# Patient Record
Sex: Male | Born: 1970
Health system: Southern US, Community
[De-identification: ages and names within clinical notes are randomized; demographics above are authoritative.]

## PROBLEM LIST (undated history)

## (undated) DIAGNOSIS — E785 Hyperlipidemia, unspecified: Secondary | ICD-10-CM

## (undated) DIAGNOSIS — I519 Heart disease, unspecified: Secondary | ICD-10-CM

## (undated) DIAGNOSIS — I10 Essential (primary) hypertension: Secondary | ICD-10-CM

## (undated) DIAGNOSIS — I509 Heart failure, unspecified: Secondary | ICD-10-CM

## (undated) DIAGNOSIS — I420 Dilated cardiomyopathy: Secondary | ICD-10-CM

## (undated) DIAGNOSIS — I428 Other cardiomyopathies: Secondary | ICD-10-CM

## (undated) DIAGNOSIS — Z8679 Personal history of other diseases of the circulatory system: Secondary | ICD-10-CM

## (undated) DIAGNOSIS — Z95810 Presence of automatic (implantable) cardiac defibrillator: Secondary | ICD-10-CM

## (undated) DIAGNOSIS — Z87442 Personal history of urinary calculi: Secondary | ICD-10-CM

## (undated) DIAGNOSIS — I5020 Unspecified systolic (congestive) heart failure: Secondary | ICD-10-CM

## (undated) HISTORY — PX: TRANSTHORACIC ECHOCARDIOGRAM: SHX275

## (undated) HISTORY — DX: Heart disease, unspecified: I51.9

## (undated) HISTORY — DX: Essential (primary) hypertension: I10

## (undated) HISTORY — PX: PACEMAKER IMPLANT: EP1218

## (undated) HISTORY — DX: Heart failure, unspecified: I50.9

## (undated) HISTORY — PX: CARDIAC DEFIBRILLATOR PLACEMENT: SHX171

## (undated) HISTORY — DX: Dilated cardiomyopathy: I42.0

## (undated) HISTORY — DX: Hyperlipidemia, unspecified: E78.5

---

## 2004-12-10 ENCOUNTER — Ambulatory Visit (HOSPITAL_COMMUNITY): Admission: RE | Admit: 2004-12-10 | Discharge: 2004-12-10 | Payer: Self-pay | Admitting: Internal Medicine

## 2004-12-10 ENCOUNTER — Ambulatory Visit: Payer: Self-pay | Admitting: Nurse Practitioner

## 2004-12-11 ENCOUNTER — Ambulatory Visit: Payer: Self-pay | Admitting: Family Medicine

## 2005-01-21 ENCOUNTER — Ambulatory Visit (HOSPITAL_COMMUNITY): Admission: RE | Admit: 2005-01-21 | Discharge: 2005-01-21 | Payer: Self-pay | Admitting: Urology

## 2005-04-15 ENCOUNTER — Ambulatory Visit: Payer: Self-pay | Admitting: Family Medicine

## 2005-04-24 ENCOUNTER — Emergency Department (HOSPITAL_COMMUNITY): Admission: EM | Admit: 2005-04-24 | Discharge: 2005-04-24 | Payer: Self-pay | Admitting: Emergency Medicine

## 2005-05-26 ENCOUNTER — Ambulatory Visit: Payer: Self-pay | Admitting: *Deleted

## 2005-11-09 ENCOUNTER — Inpatient Hospital Stay (HOSPITAL_COMMUNITY): Admission: EM | Admit: 2005-11-09 | Discharge: 2005-11-12 | Payer: Self-pay | Admitting: Emergency Medicine

## 2005-11-09 ENCOUNTER — Ambulatory Visit: Payer: Self-pay | Admitting: Cardiology

## 2005-11-09 ENCOUNTER — Encounter: Payer: Self-pay | Admitting: Cardiology

## 2005-11-09 ENCOUNTER — Ambulatory Visit: Payer: Self-pay | Admitting: Internal Medicine

## 2005-11-22 ENCOUNTER — Ambulatory Visit: Payer: Self-pay | Admitting: Family Medicine

## 2005-12-27 ENCOUNTER — Ambulatory Visit: Payer: Self-pay | Admitting: Family Medicine

## 2006-02-23 ENCOUNTER — Ambulatory Visit: Payer: Self-pay | Admitting: Family Medicine

## 2006-07-12 HISTORY — PX: OTHER SURGICAL HISTORY: SHX169

## 2006-07-25 ENCOUNTER — Ambulatory Visit (HOSPITAL_COMMUNITY): Admission: RE | Admit: 2006-07-25 | Discharge: 2006-07-25 | Payer: Self-pay | Admitting: Cardiology

## 2006-07-27 ENCOUNTER — Ambulatory Visit (HOSPITAL_COMMUNITY): Admission: RE | Admit: 2006-07-27 | Discharge: 2006-07-27 | Payer: Self-pay | Admitting: Cardiology

## 2007-12-20 ENCOUNTER — Ambulatory Visit: Payer: Self-pay | Admitting: Internal Medicine

## 2007-12-20 ENCOUNTER — Encounter (INDEPENDENT_AMBULATORY_CARE_PROVIDER_SITE_OTHER): Payer: Self-pay | Admitting: Family Medicine

## 2007-12-20 LAB — CONVERTED CEMR LAB
AST: 24 units/L (ref 0–37)
CO2: 24 meq/L (ref 19–32)
Calcium: 9.8 mg/dL (ref 8.4–10.5)
Chloride: 104 meq/L (ref 96–112)
Glucose, Bld: 100 mg/dL — ABNORMAL HIGH (ref 70–99)
Helicobacter Pylori Antibody-IgG: >8 — ABNORMAL HIGH
Microalb, Ur: 6.86 mg/dL — ABNORMAL HIGH (ref 0.00–1.89)
Potassium: 4.3 meq/L (ref 3.5–5.3)
Total Protein: 7.7 g/dL (ref 6.0–8.3)
Triglycerides: 238 mg/dL — ABNORMAL HIGH (ref <150)

## 2008-02-27 ENCOUNTER — Ambulatory Visit: Payer: Self-pay | Admitting: Internal Medicine

## 2008-04-14 ENCOUNTER — Emergency Department (HOSPITAL_COMMUNITY): Admission: EM | Admit: 2008-04-14 | Discharge: 2008-04-14 | Payer: Self-pay | Admitting: Emergency Medicine

## 2008-09-09 ENCOUNTER — Encounter (INDEPENDENT_AMBULATORY_CARE_PROVIDER_SITE_OTHER): Payer: Self-pay | Admitting: Family Medicine

## 2008-09-09 ENCOUNTER — Ambulatory Visit: Payer: Self-pay | Admitting: Internal Medicine

## 2008-09-09 LAB — CONVERTED CEMR LAB
ALT: 26 units/L (ref 0–53)
AST: 24 units/L (ref 0–37)
Alkaline Phosphatase: 77 units/L (ref 39–117)
BUN: 15 mg/dL (ref 6–23)
CO2: 22 meq/L (ref 19–32)
Creatinine, Ser: 0.99 mg/dL (ref 0.40–1.50)
Glucose, Bld: 103 mg/dL — ABNORMAL HIGH (ref 70–99)
HDL: 25 mg/dL — ABNORMAL LOW (ref >39)
Potassium: 4.3 meq/L (ref 3.5–5.3)
Sodium: 140 meq/L (ref 135–145)
Total CHOL/HDL Ratio: 6.9
Total Protein: 7.6 g/dL (ref 6.0–8.3)
Triglycerides: 278 mg/dL — ABNORMAL HIGH (ref <150)
VLDL: 56 mg/dL — ABNORMAL HIGH (ref 0–40)

## 2008-12-17 ENCOUNTER — Ambulatory Visit: Payer: Self-pay | Admitting: Internal Medicine

## 2009-01-15 ENCOUNTER — Encounter: Payer: Self-pay | Admitting: Internal Medicine

## 2009-01-15 ENCOUNTER — Ambulatory Visit (HOSPITAL_COMMUNITY): Admission: RE | Admit: 2009-01-15 | Discharge: 2009-01-15 | Payer: Self-pay | Admitting: Internal Medicine

## 2009-04-03 ENCOUNTER — Ambulatory Visit: Payer: Self-pay | Admitting: Internal Medicine

## 2009-04-03 ENCOUNTER — Encounter (INDEPENDENT_AMBULATORY_CARE_PROVIDER_SITE_OTHER): Payer: Self-pay | Admitting: Adult Health

## 2009-04-03 LAB — CONVERTED CEMR LAB
ALT: 23 units/L (ref 0–53)
AST: 20 units/L (ref 0–37)
Albumin: 4.5 g/dL (ref 3.5–5.2)
Alkaline Phosphatase: 76 units/L (ref 39–117)
Creatinine, Ser: 1.15 mg/dL (ref 0.40–1.50)
Sodium: 142 meq/L (ref 135–145)
Total Bilirubin: 0.7 mg/dL (ref 0.3–1.2)
Total CHOL/HDL Ratio: 7.2
Total Protein: 7.6 g/dL (ref 6.0–8.3)

## 2009-06-26 ENCOUNTER — Ambulatory Visit: Payer: Self-pay | Admitting: Internal Medicine

## 2009-07-03 ENCOUNTER — Ambulatory Visit: Payer: Self-pay | Admitting: Internal Medicine

## 2009-07-03 ENCOUNTER — Encounter (INDEPENDENT_AMBULATORY_CARE_PROVIDER_SITE_OTHER): Payer: Self-pay | Admitting: Adult Health

## 2009-07-03 LAB — CONVERTED CEMR LAB
AST: 28 units/L (ref 0–37)
CO2: 21 meq/L (ref 19–32)
Chloride: 102 meq/L (ref 96–112)
Cholesterol: 195 mg/dL (ref 0–200)
Magnesium: 2 mg/dL (ref 1.5–2.5)
Sodium: 139 meq/L (ref 135–145)
Total Bilirubin: 0.7 mg/dL (ref 0.3–1.2)

## 2009-07-08 ENCOUNTER — Ambulatory Visit: Payer: Self-pay | Admitting: Internal Medicine

## 2009-08-19 ENCOUNTER — Ambulatory Visit: Payer: Self-pay | Admitting: Family Medicine

## 2009-12-16 ENCOUNTER — Encounter (INDEPENDENT_AMBULATORY_CARE_PROVIDER_SITE_OTHER): Payer: Self-pay | Admitting: Adult Health

## 2009-12-16 ENCOUNTER — Ambulatory Visit: Payer: Self-pay | Admitting: Family Medicine

## 2009-12-16 LAB — CONVERTED CEMR LAB
ALT: 21 units/L (ref 0–53)
Alkaline Phosphatase: 67 units/L (ref 39–117)
BUN: 12 mg/dL (ref 6–23)
Chloride: 104 meq/L (ref 96–112)
Cholesterol: 171 mg/dL (ref 0–200)
Creatinine, Ser: 1.05 mg/dL (ref 0.40–1.50)
HDL: 24 mg/dL — ABNORMAL LOW (ref >39)
LDL Cholesterol: 92 mg/dL (ref 0–99)
Total Bilirubin: 0.5 mg/dL (ref 0.3–1.2)
Total CHOL/HDL Ratio: 7.1
Total Protein: 7.4 g/dL (ref 6.0–8.3)
Triglycerides: 276 mg/dL — ABNORMAL HIGH (ref <150)

## 2010-02-06 ENCOUNTER — Ambulatory Visit: Payer: Self-pay | Admitting: Pulmonary Disease

## 2010-02-06 ENCOUNTER — Inpatient Hospital Stay (HOSPITAL_COMMUNITY): Admission: EM | Admit: 2010-02-06 | Discharge: 2010-02-15 | Payer: Self-pay | Admitting: Emergency Medicine

## 2010-02-06 ENCOUNTER — Ambulatory Visit: Payer: Self-pay | Admitting: Internal Medicine

## 2010-02-07 ENCOUNTER — Encounter (INDEPENDENT_AMBULATORY_CARE_PROVIDER_SITE_OTHER): Payer: Self-pay | Admitting: Cardiology

## 2010-02-17 ENCOUNTER — Encounter: Payer: Self-pay | Admitting: Internal Medicine

## 2010-02-25 ENCOUNTER — Ambulatory Visit: Payer: Self-pay

## 2010-02-25 ENCOUNTER — Encounter: Payer: Self-pay | Admitting: Internal Medicine

## 2010-05-26 ENCOUNTER — Ambulatory Visit: Payer: Self-pay | Admitting: Internal Medicine

## 2010-05-26 DIAGNOSIS — I5022 Chronic systolic (congestive) heart failure: Secondary | ICD-10-CM | POA: Insufficient documentation

## 2010-05-26 DIAGNOSIS — Z95810 Presence of automatic (implantable) cardiac defibrillator: Secondary | ICD-10-CM | POA: Insufficient documentation

## 2010-08-05 ENCOUNTER — Encounter (INDEPENDENT_AMBULATORY_CARE_PROVIDER_SITE_OTHER): Payer: Self-pay | Admitting: *Deleted

## 2010-08-05 LAB — CONVERTED CEMR LAB
Alkaline Phosphatase: 68 units/L (ref 39–117)
BUN: 17 mg/dL (ref 6–23)
CO2: 23 meq/L (ref 19–32)
Calcium: 9.5 mg/dL (ref 8.4–10.5)
Creatinine, Ser: 1.03 mg/dL (ref 0.40–1.50)
Free Thyroxine Index: 4.7 — ABNORMAL HIGH (ref 1.0–3.9)
Hgb A1c MFr Bld: 5.4 % (ref <5.7)
Sodium: 139 meq/L (ref 135–145)
T3 Uptake Ratio: 34.9 % (ref 22.5–37.0)
T4, Total: 13.4 ug/dL — ABNORMAL HIGH (ref 5.0–12.5)
TSH: 1.143 microintl units/mL (ref 0.350–4.500)
Total Protein: 7 g/dL (ref 6.0–8.3)

## 2010-08-12 NOTE — Assessment & Plan Note (Signed)
Summary: df2/ gd   Visit Type:  Follow-up   History of Present Illness: Mr. Wiederholt returns today for followup. He has a DCM, and is s/p VF arrest.  He underwent ICD therapy several months ago secondary to the above problems.  No c/p, sob or peripheral edema.  He has been very sedentary and has gained weight.  No other complaints except for some incisional pain.  Current Medications (verified): 1)  Coreg 12.5 Mg Tabs (Carvedilol) .... One By Mouth Two Times A Day 2)  Amiodarone Hcl 200 Mg Tabs (Amiodarone Hcl) .... One By Mouth Daily 3)  Aspir-Low 81 Mg Tbec (Aspirin) .... One By Mouth Daily 4)  Lisinopril 10 Mg Tabs (Lisinopril) .... One By Mouth Daily 5)  Lipitor 20 Mg Tabs (Atorvastatin Calcium) .... Take One Tablet By Mouth Daily. 6)  Nitrostat 0.4 Mg Subl (Nitroglycerin) .... As Needed  Allergies: 1)  ! * Etomidate  Past History:  Past Medical History: VF arres DCM  Review of Systems  The patient denies chest pain, syncope, dyspnea on exertion, and peripheral edema.    Vital Signs:  Patient profile:   40 year old male Height:      73 inches Weight:      243 pounds BMI:     32.18 Pulse rate:   78 / minute BP sitting:   130 / 90  (left arm)  Vitals Entered By: Laurance Flatten CMA (May 26, 2010 10:11 AM)  Physical Exam  General:  Well developed, well nourished, in no acute distress.  HEENT: normal Neck: supple. No JVD. Carotids 2+ bilaterally no bruits Cor: RRR no rubs, gallops or murmur Lungs: CTA. Well healed ICD incision Ab: soft, nontender. nondistended. No HSM. Good bowel sounds Ext: warm. no cyanosis, clubbing or edema Neuro: alert and oriented. Grossly nonfocal. affect pleasant     ICD Specifications Following MD:  Lewayne Bunting, MD     ICD Vendor:  St Jude     ICD Model Number:  [id number]     ICD Serial Number:  [id number] ICD DOI:  02/13/2010     ICD Implanting MD:  Lewayne Bunting, MD  Lead 1:    Location: RV     DOI: 02/13/2010     Model #: [id number]     Serial  #: [id number]     Status: active  Indications::  VF;DCM   ICD Follow Up Battery Voltage:  GOOD V     Charge Time:  8.4 seconds     Battery Est. Longevity:  8.7 YRS Underlying rhythm:  SR ICD Dependent:  No       ICD Device Measurements Right Ventricle:  Amplitude: 11.7 mV, Impedance: 400 ohms, Threshold: 0.75 V at 0.4 msec Shock Impedance: 48 ohms   Episodes MS Episodes:  0     Coumadin:  No Shock:  0     ATP:  0     Nonsustained:  0     Atrial Therapies:  0 Ventricular Pacing:  0%  Brady Parameters Mode VVI     Lower Rate Limit:  40      Tachy Zones VF:  206     Next Cardiology Appt Due:  08/12/2010 Tech Comments:  NORMAL DEVICE FUNCTION.  NO EPISODES SINCE LAST CHECK. CHANGED RV OUTPUT FROM 3.5 TO 2.5 V. DEMONSTRATED VIBRATORY NOTIFIER FOR PT.  PT HAS MAGIC JACK PHONE SERVICE AND PER MERLIN UNABLE TO USE MERLIN.  ROV IN 3 MTHS W/DEVICE CLINIC. Vella Kohler  May 26, 2010  10:19 AM MD Comments:  Agree with above.  Impression & Recommendations:  Problem # 1:  AUTOMATIC IMPLANTABLE CARDIAC DEFIBRILLATOR SITU (ICD-V45.02) His device is working normally.  will recheck in several months.  Problem # 2:  CHRONIC SYSTOLIC HEART FAILURE (ICD-428.22) His symptoms are class 1-2.  I have asked him to continue his current meds, maintain a low sodium diet, and exercise. The following medications were removed from the medication list:    Effient 10 Mg Tabs (Prasugrel hcl) ..... One by mouth daily His updated medication list for this problem includes:    Coreg 12.5 Mg Tabs (Carvedilol) ..... One by mouth two times a day    Amiodarone Hcl 200 Mg Tabs (Amiodarone hcl) ..... One by mouth daily    Aspir-low 81 Mg Tbec (Aspirin) ..... One by mouth daily    Lisinopril 10 Mg Tabs (Lisinopril) ..... One by mouth daily    Nitrostat 0.4 Mg Subl (Nitroglycerin) .Marland Kitchen... As needed  Patient Instructions: 1)  Your physician recommends that you schedule a follow-up appointment in: 3 months in the device  clinic and 9 months with Dr Ladona Ridgel

## 2010-08-12 NOTE — Procedures (Signed)
Summary: wch./ gd   Current Medications (verified): 1)  Effient 10 Mg Tabs (Prasugrel Hcl) .... One By Mouth Daily 2)  Pepcid 20 Mg Tabs (Famotidine) .... One By Mouth Two Times A Day 3)  Coreg 12.5 Mg Tabs (Carvedilol) .... One By Mouth Two Times A Day 4)  Amiodarone Hcl 200 Mg Tabs (Amiodarone Hcl) .... One By Mouth Daily 5)  Aspir-Low 81 Mg Tbec (Aspirin) .... One By Mouth Daily 6)  Lisinopril 10 Mg Tabs (Lisinopril) .... One By Mouth Daily 7)  Simcor 500-20 Mg Xr24h-Tab (Niacin-Simvastatin) .... Two By Mouth Daily 8)  Nitrostat 0.4 Mg Subl (Nitroglycerin) .... As Needed  Allergies (verified): 1)  ! * Etomidate   ICD Specifications Following MD:  Lewayne Bunting, MD     ICD Vendor:  St Jude     ICD Model Number:  [id number]     ICD Serial Number:  [id number] ICD DOI:  02/13/2010     ICD Implanting MD:  Lewayne Bunting, MD  Lead 1:    Location: RV     DOI: 02/13/2010     Model #: [id number]     Serial #: [id number]     Status: active  Indications::  VF;DCM   ICD Follow Up Remote Check?  No Charge Time:  8.4 seconds     Battery Est. Longevity:  7.6 years Underlying rhythm:  SR ICD Dependent:  No       ICD Device Measurements Right Ventricle:  Amplitude: 11.7 mV, Impedance: 380 ohms, Threshold: 0.75 V at 0.4 msec Shock Impedance: 41 ohms   Episodes Coumadin:  No Shock:  0     ATP:  0     Nonsustained:  0     Ventricular Pacing:  0%  Brady Parameters Mode VVI     Lower Rate Limit:  40      Tachy Zones VF:  206     Next Cardiology Appt Due:  05/12/2010 Tech Comments:  Steri strips removed, no redness or edema noted.  No parameter changes.   Device function normal.  ROV 3 months with Dr. Ladona Ridgel. Altha Harm, LPN  February 25, 2010 12:50 PM

## 2010-08-12 NOTE — Cardiovascular Report (Signed)
Summary: Office Visit   Office Visit   Imported By: Roderic Ovens 05/29/2010 16:28:23  _____________________________________________________________________  External Attachment:    Type:   Image     Comment:   External Document

## 2010-08-12 NOTE — Cardiovascular Report (Signed)
Summary: Office Visit   Office Visit   Imported By: Roderic Ovens 03/12/2010 15:54:37  _____________________________________________________________________  External Attachment:    Type:   Image     Comment:   External Document

## 2010-08-12 NOTE — Miscellaneous (Signed)
Summary: Device preload  Clinical Lists Changes  Observations: Added new observation of ICD INDICATN: VF;DCM (02/17/2010 9:22) Added new observation of ICDLEADSTAT1: active (02/17/2010 9:22) Added new observation of ICDLEADSER1: 04540  (02/17/2010 9:22) Added new observation of ICDLEADMOD1: 0185  (02/17/2010 9:22) Added new observation of ICDLEADLOC1: RV  (02/17/2010 9:22) Added new observation of ICD IMP MD: Lewayne Bunting, MD  (02/17/2010 9:22) Added new observation of ICDLEADDOI1: 02/13/2010  (02/17/2010 9:22) Added new observation of ICD IMPL DTE: 02/13/2010  (02/17/2010 9:22) Added new observation of ICD SERL#: [id number]  (02/17/2010 9:22) Added new observation of ICD MODL#: [id number]  (02/17/2010 6:21) Added new observation of ICDMANUFACTR: [employer] Jude  (02/17/2010 9:22) Added new observation of ICD MD: Lewayne Bunting, MD  (02/17/2010 9:22)       ICD Specifications Following MD:  Lewayne Bunting, MD     ICD Vendor:  St Jude     ICD Model Number:  [id number]     ICD Serial Number:  [id number] ICD DOI:  02/13/2010     ICD Implanting MD:  Lewayne Bunting, MD  Lead 1:    Location: RV     DOI: 02/13/2010     Model #: [id number]     Serial #: [id number]     Status: active  Indications::  VF;DCM

## 2010-08-19 ENCOUNTER — Ambulatory Visit (HOSPITAL_COMMUNITY)
Admission: RE | Admit: 2010-08-19 | Discharge: 2010-08-19 | Disposition: A | Discharge status: Home / Self Care | Payer: Medicaid Other | Source: Ambulatory Visit | Attending: Family Medicine | Admitting: Family Medicine

## 2010-08-19 ENCOUNTER — Ambulatory Visit (HOSPITAL_COMMUNITY)
Admission: RE | Admit: 2010-08-19 | Discharge: 2010-08-19 | Disposition: A | Discharge status: Home / Self Care | Payer: Medicaid Other | Source: Ambulatory Visit | Attending: Internal Medicine | Admitting: Internal Medicine

## 2010-08-19 ENCOUNTER — Other Ambulatory Visit (HOSPITAL_COMMUNITY): Payer: Self-pay | Admitting: Family Medicine

## 2010-08-19 DIAGNOSIS — I4891 Unspecified atrial fibrillation: Secondary | ICD-10-CM | POA: Insufficient documentation

## 2010-08-19 DIAGNOSIS — R52 Pain, unspecified: Secondary | ICD-10-CM

## 2010-08-19 DIAGNOSIS — Z79899 Other long term (current) drug therapy: Secondary | ICD-10-CM | POA: Insufficient documentation

## 2010-08-19 DIAGNOSIS — R079 Chest pain, unspecified: Secondary | ICD-10-CM | POA: Insufficient documentation

## 2010-08-19 DIAGNOSIS — R0602 Shortness of breath: Secondary | ICD-10-CM | POA: Insufficient documentation

## 2010-08-23 ENCOUNTER — Inpatient Hospital Stay (INDEPENDENT_AMBULATORY_CARE_PROVIDER_SITE_OTHER)
Admission: RE | Admit: 2010-08-23 | Discharge: 2010-08-23 | Disposition: A | Discharge status: Home / Self Care | Payer: Self-pay | Source: Ambulatory Visit | Attending: Emergency Medicine | Admitting: Emergency Medicine

## 2010-08-23 ENCOUNTER — Ambulatory Visit (HOSPITAL_COMMUNITY): Payer: Self-pay

## 2010-08-23 DIAGNOSIS — H00019 Hordeolum externum unspecified eye, unspecified eyelid: Secondary | ICD-10-CM

## 2010-08-23 DIAGNOSIS — J3489 Other specified disorders of nose and nasal sinuses: Secondary | ICD-10-CM

## 2010-09-04 ENCOUNTER — Encounter (INDEPENDENT_AMBULATORY_CARE_PROVIDER_SITE_OTHER): Payer: Self-pay | Admitting: *Deleted

## 2010-09-08 NOTE — Letter (Signed)
Summary: Appointment - Missed  [facility], Main Office  [address]   [place], [place] [address]   Phone: [phone]  Fax: [phone]     September 04, 2010 MRN: [id number]   [name] [address] [place], [place]  [address]   Dear [name],  Our records indicate you missed your appointment on 08-27-10   with [facility] .  It is very important that we reach you to reschedule this appointment. We look forward to participating in your health care needs. Please contact us at the number listed above at your earliest convenience to reschedule this appointment.     Sincerely,    Glass blower/designer

## 2010-09-24 ENCOUNTER — Encounter: Payer: Self-pay | Admitting: Internal Medicine

## 2010-09-24 LAB — CONVERTED CEMR LAB
Free Thyroxine Index: 4 — ABNORMAL HIGH (ref 1.0–3.9)
T3 Uptake Ratio: 34.9 % (ref 22.5–37.0)
T4, Total: 11.6 ug/dL (ref 5.0–12.5)
TSH: 0.774 microintl units/mL (ref 0.350–4.500)

## 2010-09-25 LAB — GLUCOSE, CAPILLARY
Glucose-Capillary: 101 mg/dL — ABNORMAL HIGH (ref 70–99)
Glucose-Capillary: 103 mg/dL — ABNORMAL HIGH (ref 70–99)
Glucose-Capillary: 103 mg/dL — ABNORMAL HIGH (ref 70–99)
Glucose-Capillary: 104 mg/dL — ABNORMAL HIGH (ref 70–99)
Glucose-Capillary: 106 mg/dL — ABNORMAL HIGH (ref 70–99)
Glucose-Capillary: 118 mg/dL — ABNORMAL HIGH (ref 70–99)
Glucose-Capillary: 123 mg/dL — ABNORMAL HIGH (ref 70–99)
Glucose-Capillary: 127 mg/dL — ABNORMAL HIGH (ref 70–99)
Glucose-Capillary: 131 mg/dL — ABNORMAL HIGH (ref 70–99)
Glucose-Capillary: 131 mg/dL — ABNORMAL HIGH (ref 70–99)
Glucose-Capillary: 67 mg/dL — ABNORMAL LOW (ref 70–99)
Glucose-Capillary: 72 mg/dL (ref 70–99)
Glucose-Capillary: 86 mg/dL (ref 70–99)
Glucose-Capillary: 87 mg/dL (ref 70–99)
Glucose-Capillary: 94 mg/dL (ref 70–99)
Glucose-Capillary: 95 mg/dL (ref 70–99)
Glucose-Capillary: 95 mg/dL (ref 70–99)

## 2010-09-25 LAB — PROCALCITONIN
Procalcitonin: 0.63 ng/mL
Procalcitonin: 0.92 ng/mL

## 2010-09-25 LAB — DIFFERENTIAL
Basophils Absolute: 0.1 10*3/uL (ref 0.0–0.1)
Eosinophils Absolute: 1.2 10*3/uL — ABNORMAL HIGH (ref 0.0–0.7)
Eosinophils Relative: 11 % — ABNORMAL HIGH (ref 0–5)
Eosinophils Relative: 11 % — ABNORMAL HIGH (ref 0–5)
Lymphocytes Relative: 24 % (ref 12–46)
Lymphocytes Relative: 35 % (ref 12–46)
Lymphs Abs: 3.3 10*3/uL (ref 0.7–4.0)
Lymphs Abs: 3.7 10*3/uL (ref 0.7–4.0)
Monocytes Absolute: 1 10*3/uL (ref 0.1–1.0)
Monocytes Absolute: 1.3 10*3/uL — ABNORMAL HIGH (ref 0.1–1.0)
Monocytes Absolute: 1.3 10*3/uL — ABNORMAL HIGH (ref 0.1–1.0)
Monocytes Relative: 11 % (ref 3–12)
Neutro Abs: 4.5 10*3/uL (ref 1.7–7.7)
Neutro Abs: 5.2 10*3/uL (ref 1.7–7.7)
Neutrophils Relative %: 55 % (ref 43–77)

## 2010-09-25 LAB — COMPREHENSIVE METABOLIC PANEL
ALT: 22 U/L (ref 0–53)
BUN: 7 mg/dL (ref 6–23)
Calcium: 8.4 mg/dL (ref 8.4–10.5)
Creatinine, Ser: 1.11 mg/dL (ref 0.4–1.5)
Glucose, Bld: 83 mg/dL (ref 70–99)
Sodium: 138 mEq/L (ref 135–145)
Total Protein: 5.8 g/dL — ABNORMAL LOW (ref 6.0–8.3)

## 2010-09-25 LAB — BRAIN NATRIURETIC PEPTIDE: Pro B Natriuretic peptide (BNP): 134 pg/mL — ABNORMAL HIGH (ref 0.0–100.0)

## 2010-09-25 LAB — BASIC METABOLIC PANEL
BUN: 12 mg/dL (ref 6–23)
BUN: 14 mg/dL (ref 6–23)
BUN: 16 mg/dL (ref 6–23)
BUN: 17 mg/dL (ref 6–23)
BUN: 18 mg/dL (ref 6–23)
CO2: 22 mEq/L (ref 19–32)
CO2: 26 mEq/L (ref 19–32)
CO2: 27 mEq/L (ref 19–32)
Calcium: 8 mg/dL — ABNORMAL LOW (ref 8.4–10.5)
Calcium: 8.9 mg/dL (ref 8.4–10.5)
Calcium: 9 mg/dL (ref 8.4–10.5)
Calcium: 9.3 mg/dL (ref 8.4–10.5)
Chloride: 100 mEq/L (ref 96–112)
Chloride: 104 mEq/L (ref 96–112)
Chloride: 105 mEq/L (ref 96–112)
Chloride: 99 mEq/L (ref 96–112)
Creatinine, Ser: 1.33 mg/dL (ref 0.4–1.5)
Creatinine, Ser: 1.39 mg/dL (ref 0.4–1.5)
Creatinine, Ser: 1.43 mg/dL (ref 0.4–1.5)
GFR calc Af Amer: >60 mL/min (ref >60)
GFR calc Af Amer: >60 mL/min (ref >60)
GFR calc Af Amer: >60 mL/min (ref >60)
GFR calc non Af Amer: >60 mL/min (ref >60)
GFR calc non Af Amer: >60 mL/min (ref >60)
GFR calc non Af Amer: >60 mL/min (ref >60)
Glucose, Bld: 132 mg/dL — ABNORMAL HIGH (ref 70–99)
Glucose, Bld: 84 mg/dL (ref 70–99)
Potassium: 3.1 mEq/L — ABNORMAL LOW (ref 3.5–5.1)
Potassium: 3.6 mEq/L (ref 3.5–5.1)
Potassium: 3.6 mEq/L (ref 3.5–5.1)
Potassium: 3.6 mEq/L (ref 3.5–5.1)
Potassium: 3.7 mEq/L (ref 3.5–5.1)
Potassium: 3.7 mEq/L (ref 3.5–5.1)
Sodium: 136 mEq/L (ref 135–145)
Sodium: 139 mEq/L (ref 135–145)
Sodium: 139 mEq/L (ref 135–145)
Sodium: 141 mEq/L (ref 135–145)
Sodium: 142 mEq/L (ref 135–145)

## 2010-09-25 LAB — CBC
HCT: 34 % — ABNORMAL LOW (ref 39.0–52.0)
HCT: 34.9 % — ABNORMAL LOW (ref 39.0–52.0)
HCT: 37.5 % — ABNORMAL LOW (ref 39.0–52.0)
HCT: 43.3 % (ref 39.0–52.0)
Hemoglobin: 12 g/dL — ABNORMAL LOW (ref 13.0–17.0)
Hemoglobin: 12.2 g/dL — ABNORMAL LOW (ref 13.0–17.0)
Hemoglobin: 13.2 g/dL (ref 13.0–17.0)
Hemoglobin: 15.2 g/dL (ref 13.0–17.0)
MCH: 28.1 pg (ref 26.0–34.0)
MCH: 28.8 pg (ref 26.0–34.0)
MCV: 80.5 fL (ref 78.0–100.0)
MCV: 80.9 fL (ref 78.0–100.0)
Platelets: 323 10*3/uL (ref 150–400)
RBC: 4.16 MIL/uL — ABNORMAL LOW (ref 4.22–5.81)
RBC: 4.21 MIL/uL — ABNORMAL LOW (ref 4.22–5.81)
RBC: 4.3 MIL/uL (ref 4.22–5.81)
RBC: 5.38 MIL/uL (ref 4.22–5.81)
RBC: 5.4 MIL/uL (ref 4.22–5.81)
RDW: 14.8 % (ref 11.5–15.5)
WBC: 10.7 10*3/uL — ABNORMAL HIGH (ref 4.0–10.5)
WBC: 9.3 10*3/uL (ref 4.0–10.5)
WBC: 9.5 10*3/uL (ref 4.0–10.5)

## 2010-09-25 LAB — BLOOD GAS, ARTERIAL
FIO2: 0.3 %
MECHVT: 660 mL
PEEP: 5 cmH2O
Patient temperature: 98.6
RATE: 16 resp/min
pCO2 arterial: 34.8 mmHg — ABNORMAL LOW (ref 35.0–45.0)
pH, Arterial: 7.4 (ref 7.350–7.450)
pO2, Arterial: 111 mmHg — ABNORMAL HIGH (ref 80.0–100.0)

## 2010-09-25 LAB — CULTURE, BAL-QUANTITATIVE W GRAM STAIN

## 2010-09-25 LAB — T4, FREE: Free T4: 1.16 ng/dL (ref 0.80–1.80)

## 2010-09-25 LAB — SODIUM, URINE, RANDOM: Sodium, Ur: 215 mEq/L

## 2010-09-25 LAB — MAGNESIUM
Magnesium: 2.4 mg/dL (ref 1.5–2.5)
Magnesium: 2.4 mg/dL (ref 1.5–2.5)
Magnesium: 2.5 mg/dL (ref 1.5–2.5)
Magnesium: 2.7 mg/dL — ABNORMAL HIGH (ref 1.5–2.5)

## 2010-09-25 LAB — T3, FREE: T3, Free: 1.9 pg/mL — ABNORMAL LOW (ref 2.3–4.2)

## 2010-09-25 LAB — PROTIME-INR: Prothrombin Time: 14.5 seconds (ref 11.6–15.2)

## 2010-09-25 LAB — PHOSPHORUS: Phosphorus: 3.7 mg/dL (ref 2.3–4.6)

## 2010-09-26 LAB — POCT I-STAT, CHEM 8
BUN: 11 mg/dL (ref 6–23)
Calcium, Ion: 0.88 mmol/L — ABNORMAL LOW (ref 1.12–1.32)
Chloride: 111 mEq/L (ref 96–112)
Creatinine, Ser: 1.1 mg/dL (ref 0.4–1.5)
Glucose, Bld: 191 mg/dL — ABNORMAL HIGH (ref 70–99)
TCO2: 18 mmol/L (ref 0–100)

## 2010-09-26 LAB — BASIC METABOLIC PANEL
BUN: 10 mg/dL (ref 6–23)
BUN: 10 mg/dL (ref 6–23)
BUN: 10 mg/dL (ref 6–23)
BUN: 11 mg/dL (ref 6–23)
BUN: 12 mg/dL (ref 6–23)
BUN: 12 mg/dL (ref 6–23)
BUN: 13 mg/dL (ref 6–23)
BUN: 9 mg/dL (ref 6–23)
CO2: 14 mEq/L — ABNORMAL LOW (ref 19–32)
CO2: 15 mEq/L — ABNORMAL LOW (ref 19–32)
CO2: 15 mEq/L — ABNORMAL LOW (ref 19–32)
CO2: 16 mEq/L — ABNORMAL LOW (ref 19–32)
CO2: 17 mEq/L — ABNORMAL LOW (ref 19–32)
CO2: 17 mEq/L — ABNORMAL LOW (ref 19–32)
CO2: 17 mEq/L — ABNORMAL LOW (ref 19–32)
CO2: 17 mEq/L — ABNORMAL LOW (ref 19–32)
CO2: 17 mEq/L — ABNORMAL LOW (ref 19–32)
Calcium: 7.7 mg/dL — ABNORMAL LOW (ref 8.4–10.5)
Calcium: 8.1 mg/dL — ABNORMAL LOW (ref 8.4–10.5)
Calcium: 8.1 mg/dL — ABNORMAL LOW (ref 8.4–10.5)
Calcium: 8.4 mg/dL (ref 8.4–10.5)
Chloride: 112 mEq/L (ref 96–112)
Chloride: 113 mEq/L — ABNORMAL HIGH (ref 96–112)
Chloride: 114 mEq/L — ABNORMAL HIGH (ref 96–112)
Chloride: 115 mEq/L — ABNORMAL HIGH (ref 96–112)
Chloride: 115 mEq/L — ABNORMAL HIGH (ref 96–112)
Chloride: 116 mEq/L — ABNORMAL HIGH (ref 96–112)
Chloride: 117 mEq/L — ABNORMAL HIGH (ref 96–112)
Chloride: 118 mEq/L — ABNORMAL HIGH (ref 96–112)
Creatinine, Ser: 0.72 mg/dL (ref 0.4–1.5)
Creatinine, Ser: 0.75 mg/dL (ref 0.4–1.5)
Creatinine, Ser: 0.79 mg/dL (ref 0.4–1.5)
Creatinine, Ser: 0.79 mg/dL (ref 0.4–1.5)
Creatinine, Ser: 0.82 mg/dL (ref 0.4–1.5)
Creatinine, Ser: 0.84 mg/dL (ref 0.4–1.5)
Creatinine, Ser: 0.87 mg/dL (ref 0.4–1.5)
Creatinine, Ser: 0.9 mg/dL (ref 0.4–1.5)
Creatinine, Ser: 1.01 mg/dL (ref 0.4–1.5)
Creatinine, Ser: 1.11 mg/dL (ref 0.4–1.5)
GFR calc Af Amer: >60 mL/min (ref >60)
GFR calc Af Amer: >60 mL/min (ref >60)
GFR calc Af Amer: >60 mL/min (ref >60)
GFR calc Af Amer: >60 mL/min (ref >60)
GFR calc Af Amer: >60 mL/min (ref >60)
GFR calc Af Amer: >60 mL/min (ref >60)
GFR calc Af Amer: >60 mL/min (ref >60)
GFR calc Af Amer: >60 mL/min (ref >60)
GFR calc non Af Amer: >60 mL/min (ref >60)
GFR calc non Af Amer: >60 mL/min (ref >60)
GFR calc non Af Amer: >60 mL/min (ref >60)
GFR calc non Af Amer: >60 mL/min (ref >60)
Glucose, Bld: 118 mg/dL — ABNORMAL HIGH (ref 70–99)
Glucose, Bld: 137 mg/dL — ABNORMAL HIGH (ref 70–99)
Glucose, Bld: 161 mg/dL — ABNORMAL HIGH (ref 70–99)
Glucose, Bld: 169 mg/dL — ABNORMAL HIGH (ref 70–99)
Glucose, Bld: 174 mg/dL — ABNORMAL HIGH (ref 70–99)
Glucose, Bld: 223 mg/dL — ABNORMAL HIGH (ref 70–99)
Potassium: 3.5 mEq/L (ref 3.5–5.1)
Potassium: 3.6 mEq/L (ref 3.5–5.1)
Potassium: 3.7 mEq/L (ref 3.5–5.1)
Potassium: 3.8 mEq/L (ref 3.5–5.1)
Potassium: 3.8 mEq/L (ref 3.5–5.1)
Potassium: 4 mEq/L (ref 3.5–5.1)
Potassium: 4 mEq/L (ref 3.5–5.1)
Potassium: 4.3 mEq/L (ref 3.5–5.1)
Potassium: 5.5 mEq/L — ABNORMAL HIGH (ref 3.5–5.1)
Sodium: 134 mEq/L — ABNORMAL LOW (ref 135–145)
Sodium: 135 mEq/L (ref 135–145)
Sodium: 137 mEq/L (ref 135–145)

## 2010-09-26 LAB — GLUCOSE, CAPILLARY
Glucose-Capillary: 101 mg/dL — ABNORMAL HIGH (ref 70–99)
Glucose-Capillary: 117 mg/dL — ABNORMAL HIGH (ref 70–99)
Glucose-Capillary: 124 mg/dL — ABNORMAL HIGH (ref 70–99)
Glucose-Capillary: 138 mg/dL — ABNORMAL HIGH (ref 70–99)
Glucose-Capillary: 145 mg/dL — ABNORMAL HIGH (ref 70–99)
Glucose-Capillary: 151 mg/dL — ABNORMAL HIGH (ref 70–99)
Glucose-Capillary: 163 mg/dL — ABNORMAL HIGH (ref 70–99)
Glucose-Capillary: 163 mg/dL — ABNORMAL HIGH (ref 70–99)
Glucose-Capillary: 166 mg/dL — ABNORMAL HIGH (ref 70–99)
Glucose-Capillary: 171 mg/dL — ABNORMAL HIGH (ref 70–99)
Glucose-Capillary: 178 mg/dL — ABNORMAL HIGH (ref 70–99)
Glucose-Capillary: 183 mg/dL — ABNORMAL HIGH (ref 70–99)
Glucose-Capillary: 194 mg/dL — ABNORMAL HIGH (ref 70–99)
Glucose-Capillary: 195 mg/dL — ABNORMAL HIGH (ref 70–99)
Glucose-Capillary: 221 mg/dL — ABNORMAL HIGH (ref 70–99)
Glucose-Capillary: 268 mg/dL — ABNORMAL HIGH (ref 70–99)
Glucose-Capillary: 91 mg/dL (ref 70–99)

## 2010-09-26 LAB — PHOSPHORUS
Phosphorus: 2.8 mg/dL (ref 2.3–4.6)
Phosphorus: 2.9 mg/dL (ref 2.3–4.6)
Phosphorus: 3.8 mg/dL (ref 2.3–4.6)
Phosphorus: <1 mg/dL — CL (ref 2.3–4.6)
Phosphorus: <1 mg/dL — CL (ref 2.3–4.6)

## 2010-09-26 LAB — DIFFERENTIAL
Basophils Absolute: 0.1 10*3/uL (ref 0.0–0.1)
Eosinophils Absolute: 0.7 10*3/uL (ref 0.0–0.7)
Lymphs Abs: 8.2 10*3/uL — ABNORMAL HIGH (ref 0.7–4.0)
Monocytes Absolute: 0.8 10*3/uL (ref 0.1–1.0)
Monocytes Relative: 6 % (ref 3–12)
Neutro Abs: 3.8 10*3/uL (ref 1.7–7.7)

## 2010-09-26 LAB — BLOOD GAS, ARTERIAL
Acid-base deficit: 7.4 mmol/L — ABNORMAL HIGH (ref 0.0–2.0)
Bicarbonate: 16.1 mEq/L — ABNORMAL LOW (ref 20.0–24.0)
Bicarbonate: 18.2 mEq/L — ABNORMAL LOW (ref 20.0–24.0)
Bicarbonate: 20.3 mEq/L (ref 20.0–24.0)
FIO2: 0.5 %
O2 Saturation: 100 %
O2 Saturation: 96 %
PEEP: 5 cmH2O
PEEP: 5 cmH2O
Patient temperature: 98.6
TCO2: 21.4 mmol/L (ref 0–100)
pCO2 arterial: 24.3 mmHg — ABNORMAL LOW (ref 35.0–45.0)
pCO2 arterial: 37.2 mmHg (ref 35.0–45.0)
pH, Arterial: 7.311 — ABNORMAL LOW (ref 7.350–7.450)
pH, Arterial: 7.388 (ref 7.350–7.450)
pO2, Arterial: 103 mmHg — ABNORMAL HIGH (ref 80.0–100.0)
pO2, Arterial: 344 mmHg — ABNORMAL HIGH (ref 80.0–100.0)

## 2010-09-26 LAB — MAGNESIUM
Magnesium: 1.8 mg/dL (ref 1.5–2.5)
Magnesium: 1.9 mg/dL (ref 1.5–2.5)
Magnesium: 2 mg/dL (ref 1.5–2.5)
Magnesium: 2.2 mg/dL (ref 1.5–2.5)

## 2010-09-26 LAB — CBC
HCT: 42.6 % (ref 39.0–52.0)
Hemoglobin: 16.8 g/dL (ref 13.0–17.0)
MCH: 28.9 pg (ref 26.0–34.0)
MCH: 29.5 pg (ref 26.0–34.0)
MCHC: 34.3 g/dL (ref 30.0–36.0)
MCV: 83.3 fL (ref 78.0–100.0)
MCV: 84.7 fL (ref 78.0–100.0)
Platelets: 260 10*3/uL (ref 150–400)
Platelets: 286 10*3/uL (ref 150–400)
Platelets: 344 10*3/uL (ref 150–400)
RBC: 5.35 MIL/uL (ref 4.22–5.81)
RBC: 5.74 MIL/uL (ref 4.22–5.81)
RBC: 5.81 MIL/uL (ref 4.22–5.81)
RDW: 13.6 % (ref 11.5–15.5)
RDW: 14.1 % (ref 11.5–15.5)
RDW: 14.1 % (ref 11.5–15.5)
WBC: 10.4 10*3/uL (ref 4.0–10.5)
WBC: 23 10*3/uL — ABNORMAL HIGH (ref 4.0–10.5)

## 2010-09-26 LAB — URINALYSIS, ROUTINE W REFLEX MICROSCOPIC
Bilirubin Urine: NEGATIVE
Bilirubin Urine: NEGATIVE
Glucose, UA: 250 mg/dL — AB
Ketones, ur: NEGATIVE mg/dL
Ketones, ur: NEGATIVE mg/dL
Leukocytes, UA: NEGATIVE
Leukocytes, UA: NEGATIVE
Nitrite: NEGATIVE
Nitrite: NEGATIVE
Protein, ur: >300 mg/dL — AB
Protein, ur: NEGATIVE mg/dL
Protein, ur: NEGATIVE mg/dL
Specific Gravity, Urine: 1.01 (ref 1.005–1.030)
Specific Gravity, Urine: 1.02 (ref 1.005–1.030)
Urobilinogen, UA: 0.2 mg/dL (ref 0.0–1.0)
Urobilinogen, UA: 1 mg/dL (ref 0.0–1.0)

## 2010-09-26 LAB — TSH: TSH: 0.07 u[IU]/mL — ABNORMAL LOW (ref 0.350–4.500)

## 2010-09-26 LAB — CULTURE, RESPIRATORY W GRAM STAIN

## 2010-09-26 LAB — POCT I-STAT 3, ART BLOOD GAS (G3+)
Bicarbonate: 20.4 mEq/L (ref 20.0–24.0)
Bicarbonate: 20.9 mEq/L (ref 20.0–24.0)
O2 Saturation: 96 %
TCO2: 22 mmol/L (ref 0–100)
pCO2 arterial: 48.8 mmHg — ABNORMAL HIGH (ref 35.0–45.0)
pH, Arterial: 7.184 — CL (ref 7.350–7.450)
pH, Arterial: 7.24 — ABNORMAL LOW (ref 7.350–7.450)

## 2010-09-26 LAB — POCT CARDIAC MARKERS: Troponin i, poc: <0.05 ng/mL (ref 0.00–0.09)

## 2010-09-26 LAB — URINE CULTURE
Colony Count: NO GROWTH
Colony Count: NO GROWTH
Culture: NO GROWTH
Culture: NO GROWTH
Special Requests: NEGATIVE

## 2010-09-26 LAB — PROTIME-INR: INR: 1.11 (ref 0.00–1.49)

## 2010-09-26 LAB — HEPATIC FUNCTION PANEL
ALT: 57 U/L — ABNORMAL HIGH (ref 0–53)
AST: 53 U/L — ABNORMAL HIGH (ref 0–37)
Alkaline Phosphatase: 62 U/L (ref 39–117)
Bilirubin, Direct: 0.3 mg/dL (ref 0.0–0.3)
Total Bilirubin: 1.3 mg/dL — ABNORMAL HIGH (ref 0.3–1.2)

## 2010-09-26 LAB — CARDIAC PANEL(CRET KIN+CKTOT+MB+TROPI)
CK, MB: 5.5 ng/mL — ABNORMAL HIGH (ref 0.3–4.0)
Relative Index: 3 — ABNORMAL HIGH (ref 0.0–2.5)
Relative Index: 4 — ABNORMAL HIGH (ref 0.0–2.5)
Relative Index: 5.3 — ABNORMAL HIGH (ref 0.0–2.5)
Total CK: 309 U/L — ABNORMAL HIGH (ref 7–232)
Troponin I: 0.05 ng/mL (ref 0.00–0.06)
Troponin I: 0.23 ng/mL — ABNORMAL HIGH (ref 0.00–0.06)

## 2010-09-26 LAB — URINE MICROSCOPIC-ADD ON

## 2010-09-26 LAB — URINE DRUGS OF ABUSE SCREEN W ALC, ROUTINE (REF LAB)
Benzodiazepines.: POSITIVE — AB
Cocaine Metabolites: NEGATIVE
Creatinine,U: 22.7 mg/dL
Ethyl Alcohol: <10 mg/dL (ref <10)
Opiate Screen, Urine: NEGATIVE

## 2010-09-26 LAB — CULTURE, BLOOD (ROUTINE X 2)
Culture: NO GROWTH
Culture: NO GROWTH

## 2010-09-26 LAB — BENZODIAZEPINE, QUANTITATIVE, URINE
Nordiazepam GC/MS Conf: NEGATIVE NG/ML
Oxazepam GC/MS Conf: NEGATIVE NG/ML
Temazepam GC/MS Conf: NEGATIVE NG/ML

## 2010-09-26 LAB — BRAIN NATRIURETIC PEPTIDE
Pro B Natriuretic peptide (BNP): 225 pg/mL — ABNORMAL HIGH (ref 0.0–100.0)
Pro B Natriuretic peptide (BNP): 38 pg/mL (ref 0.0–100.0)

## 2010-09-26 LAB — APTT: aPTT: 26 seconds (ref 24–37)

## 2010-09-26 LAB — MRSA PCR SCREENING: MRSA by PCR: NEGATIVE

## 2010-09-30 ENCOUNTER — Encounter: Payer: Self-pay | Admitting: *Deleted

## 2010-10-16 ENCOUNTER — Inpatient Hospital Stay (INDEPENDENT_AMBULATORY_CARE_PROVIDER_SITE_OTHER)
Admission: RE | Admit: 2010-10-16 | Discharge: 2010-10-16 | Disposition: A | Discharge status: Home / Self Care | Payer: Medicaid Other | Source: Ambulatory Visit | Attending: Emergency Medicine | Admitting: Emergency Medicine

## 2010-10-16 DIAGNOSIS — J309 Allergic rhinitis, unspecified: Secondary | ICD-10-CM

## 2010-10-16 DIAGNOSIS — H1045 Other chronic allergic conjunctivitis: Secondary | ICD-10-CM

## 2010-11-26 ENCOUNTER — Ambulatory Visit (INDEPENDENT_AMBULATORY_CARE_PROVIDER_SITE_OTHER): Payer: Medicaid Other | Admitting: *Deleted

## 2010-11-26 DIAGNOSIS — I4901 Ventricular fibrillation: Secondary | ICD-10-CM

## 2010-11-27 NOTE — Discharge Summary (Signed)
NAMEHANNIBAL, [name] NO.:  [id number]   MEDICAL RECORD NO.:  [id number]          PATIENT TYPE:  INP   LOCATION:  4713                         FACILITY:  [facility]   PHYSICIAN:  Ileana Roup, M.D.  DATE OF BIRTH:  1971/01/07   DATE OF ADMISSION:  11/09/2005  DATE OF DISCHARGE:  11/12/2005                                 DISCHARGE SUMMARY   DISCHARGE DIAGNOSES:  1. Acute pulmonary edema.  2. Congestive heart failure.  3. Nonischemic cardiomyopathy.  4. Hypertension.   DISCHARGE MEDICATIONS:  1. Metoprolol 25 mg b.i.d.  2. Lisinopril 10 mg every day.  3. Lasix 40 mg every day.  4. K-Dur 20 mEq every day.  5. Baby aspirin 81 mg every day.  6. Lipitor 40 mg every day.   DISPOSITION/AND FOLLOWUP:  The patient will followup with his primary care  physician at [facility], which is Dr. Fannie Knee Drinkard, on Nov 18, 2005 at  10:15 a.m.  Phone number [phone].   IMAGING STUDIES:  Upon admission the patient had a chest x-ray on Nov 09, 2005 which showed diffuse interstitial and bilateral air space disease.  A  CT scan of the chest on Nov 09, 2005 showed bilateral airspace disease, left  greater than right which spares the periphery,associated with interlobular  septal thickening, most compatible with edema; moderate bilateral pleural  effusions; no evidence of pulmonary embolus;and  mildly enlarged mediastinal  lymph nodes. The patient also had a 2-D echocardiogram on Nov 09, 2005  consistent with overall left ventricular systolic function moderately to  markedly decreased with an ejection fraction of 30 to 35% with severe  diffuse left ventricular hypokinesis; mild to moderate thickening of the  mitral valve; moderate mitral valvular regurgitation; the left atrium was  mild to moderately dilated.  The patient had a stress Myoview on Nov 10, 2005  which showed no evidence of myocardial ischemia, and decreased activity in  the inferior wall, septum and apex probably  artifactual related to focal  increased activity in the anterior and lateral walls; left ventricular  enlargement, global hypokinesis and an ejection fraction of 34%.  Chest x-  ray upon discharge on Nov 12, 2005 showed interval improvement in aeration  and edema.   HISTORY AND PHYSICAL:  For full details please refer to the patient's chart,  but in brief the patient is a 40 year old male from [place] with no  significant past medical history who came into the emergency room  complaining of shortness of breath x2 days that started during sleeping  after he was moving.  He denied any chest pain, nausea, vomiting, or  palpitations. He had similar episodes about two months prior but his  shortness of breath was less severe.  After being treated with nebulizer  treatments and Lasix he felt much better.   PHYSICAL EXAMINATION:  VITAL SIGNS: Upon admission, temperature 99.4, blood  pressure 147/100 with a heart rate of 122, respirations 22 and labored.  Oxygen saturation 95% on 3 liters who was not in acute distress.  HEENT: Eyes:  Equally reactive to light with extraocular movements  intact.  RESPIRATORY: He had poor air movement and diffuse crackles.  CARDIOVASCULAR: He was tachycardic with a regular rhythm.  No murmurs  appreciated.  GASTROINTESTINAL: Soft, nontender, nondistended, positive bowel sounds.  NEUROLOGICAL: He was neurologically intact.   LABORATORY DATA:  BNP 290.  D-dimer 0.81.  Sodium 140.  Potassium 3.8.  Chloride 109.  Bicarbonate 23.  BUN 14.  Creatinine 1.5.  Glucose 121.  WBC  10.3.  Hemoglobin 16.5.  Platelets 254.  His PT was 12.9.  INR was 1.0.  PTT  21.   HOSPITAL COURSE:  His acute pulmonary edema secondary to CHF was treated  with Lasix and he diuresed appropriately, over three liters throughout his  entire hospital stay.  A 2-D echocardiogram showed left ventricular  dysfucntion as above, so cardiology was consulted and a stress Myoview  Cardiolite was performed  which showed a decreased ejection fraction but no  evidence of ischemia, consistent with nonischemic cardiomyopathy.  Medical  treatment was started with a beta blocker, Ace inhibitor as well as Lasix  and as well treating his risk factors.  He was placed on Lipitor for the  hyperlipidemia and daily aspirin.   Vital signs on the day of discharge, temperature 97.7, blood pressure 121/58  with a heart rate of 61, saturating at 96% on room air.  Labs on the day of  discharge, WBC 10.7, hemoglobin 16.6, platelets 296, sodium 140, potassium  4.0, chloride 104, bicarbonate 28, BUN 16, creatinine 1.3, glucose of 99.      Peggye Pitt, M.D.  Electronically Signed      Ileana Roup, M.D.  Electronically Signed    EH/MEDQ  D:  01/03/2006  T:  01/03/2006  Job:  [id number]   cc:   Eduardo Osier. Sharyn Lull, M.D.

## 2011-01-06 ENCOUNTER — Other Ambulatory Visit: Payer: Self-pay | Admitting: Geriatric Medicine

## 2011-01-06 DIAGNOSIS — R109 Unspecified abdominal pain: Secondary | ICD-10-CM

## 2011-01-07 ENCOUNTER — Ambulatory Visit
Admission: RE | Admit: 2011-01-07 | Discharge: 2011-01-07 | Disposition: A | Discharge status: Home / Self Care | Payer: Medicaid Other | Source: Ambulatory Visit | Attending: Geriatric Medicine | Admitting: Geriatric Medicine

## 2011-01-07 DIAGNOSIS — R109 Unspecified abdominal pain: Secondary | ICD-10-CM

## 2011-01-07 DIAGNOSIS — R10A1 Flank pain, right side: Secondary | ICD-10-CM

## 2011-01-08 ENCOUNTER — Other Ambulatory Visit: Payer: Medicaid Other

## 2011-03-01 ENCOUNTER — Encounter: Payer: Medicaid Other | Admitting: *Deleted

## 2011-03-29 ENCOUNTER — Encounter: Payer: Self-pay | Admitting: Internal Medicine

## 2011-03-29 ENCOUNTER — Ambulatory Visit (INDEPENDENT_AMBULATORY_CARE_PROVIDER_SITE_OTHER): Payer: Self-pay | Admitting: *Deleted

## 2011-03-29 DIAGNOSIS — I4901 Ventricular fibrillation: Secondary | ICD-10-CM

## 2011-03-29 DIAGNOSIS — I5022 Chronic systolic (congestive) heart failure: Secondary | ICD-10-CM

## 2011-03-29 LAB — ICD DEVICE OBSERVATION
BRDY-0002RV: 40 {beats}/min
FVT: 0
HV IMPEDENCE: 49 Ohm
TOT-0007: 1
VENTRICULAR PACING ICD: 0.01 pct
VF: 0

## 2011-03-29 NOTE — Progress Notes (Signed)
ICD check with ICM 

## 2011-04-12 LAB — POCT I-STAT, CHEM 8
Calcium, Ion: 1.16
Creatinine, Ser: 1.1
Hemoglobin: 15.3
Sodium: 138
TCO2: 23

## 2011-05-20 ENCOUNTER — Encounter: Payer: Self-pay | Admitting: Internal Medicine

## 2011-05-20 ENCOUNTER — Ambulatory Visit (INDEPENDENT_AMBULATORY_CARE_PROVIDER_SITE_OTHER): Payer: Self-pay | Admitting: Internal Medicine

## 2011-05-20 DIAGNOSIS — I428 Other cardiomyopathies: Secondary | ICD-10-CM

## 2011-05-20 DIAGNOSIS — I5022 Chronic systolic (congestive) heart failure: Secondary | ICD-10-CM

## 2011-05-20 DIAGNOSIS — Z95810 Presence of automatic (implantable) cardiac defibrillator: Secondary | ICD-10-CM

## 2011-05-20 NOTE — Assessment & Plan Note (Signed)
His device is working normally. He'll continue his current medical therapy

## 2011-05-20 NOTE — Progress Notes (Signed)
HPI [name] returns today for followup. He has a dilated cardiomyopathy and class 1-2 congestive heart failure. He is status post resuscitated ventricular fibrillation arrest. The patient has had no recurrent ICD therapies. He denies chest pain, shortness of breath, or peripheral edema. No Known Allergies   Current Outpatient Prescriptions  Medication Sig Dispense Refill  . amiodarone (PACERONE) 200 MG tablet Take 200 mg by mouth daily.        Marland Kitchen aspirin 81 MG tablet Take 81 mg by mouth 2 (two) times daily.        . carvedilol (COREG) 12.5 MG tablet Take 12.5 mg by mouth 2 (two) times daily with a meal.        . lisinopril (PRINIVIL,ZESTRIL) 10 MG tablet Take 10 mg by mouth 2 (two) times daily.           Past Medical History  Diagnosis Date  . DCM (dilated cardiomyopathy)   . LV dysfunction     with an EF in 2007 at 34%  . CHF (congestive heart failure)   . HTN (hypertension)   . Dyslipidemia     ROS:   All systems reviewed and negative except as noted in the HPI.   Past Surgical History  Procedure Date  . Repeat echo 2008     demonstrated near normalization  . Pacemaker insertion   . Transthoracic echocardiogram 11/09/2005, 01/15/2009, 02/07/2010     No family history on file.   History   Social History  . Marital Status: Married    Spouse Name: N/A    Number of Children: N/A  . Years of Education: N/A   Occupational History  . Not on file.   Social History Main Topics  . Smoking status: Former Smoker    Quit date: 05/17/2009  . Smokeless tobacco: Not on file  . Alcohol Use: No     Denies  . Drug Use: Not on file  . Sexually Active: Not on file   Other Topics Concern  . Not on file   Social History Narrative   The patient is married.  He is in native of [place],  living in [place] for 9 years.  He has a history of tobacco use,  but quit smoking a year ago.  Denies alcohol abuse.        BP 118/76  Pulse 64  Ht 6\' 2"  (1.88 m)  Wt 245 lb  (111.131 kg)  BMI 31.46 kg/m2  Physical Exam:  Well appearing 40 year old manNAD HEENT: Unremarkable Neck:  No JVD, no thyromegally Lymphatics:  No adenopathy Back:  No CVA tenderness Lungs:  Clear with no wheezes, rales, or rhonchi. Well-healed ICD incision. HEART:  Regular rate rhythm, no murmurs, no rubs, no clicks Abd:  soft, positive bowel sounds, no organomegally, no rebound, no guarding Ext:  2 plus pulses, no edema, no cyanosis, no clubbing Skin:  No rashes no nodules Neuro:  CN II through XII intact, motor grossly intact DEVICE  Normal device function.  See PaceArt for details.   Assess/Plan:

## 2011-05-20 NOTE — Patient Instructions (Signed)
Your physician recommends that you schedule a follow-up appointment in: 3 months with Kristin/ Gunnar Fusi for a device check.  Your physician wants you to follow-up in: 1 year with Dr. Ladona Ridgel. You will receive a reminder letter in the mail two months in advance. If you don't receive a letter, please call our office to schedule the follow-up appointment.  Your physician recommends that you continue on your current medications as directed. Please refer to the Current Medication list given to you today.

## 2011-05-20 NOTE — Assessment & Plan Note (Signed)
His symptoms are currently class 1-2. He will continue his current medical therapy and I've asked him to maintain a low-sodium diet.

## 2011-07-02 ENCOUNTER — Encounter (HOSPITAL_COMMUNITY): Payer: Self-pay | Admitting: *Deleted

## 2011-07-02 ENCOUNTER — Emergency Department (INDEPENDENT_AMBULATORY_CARE_PROVIDER_SITE_OTHER)
Admission: EM | Admit: 2011-07-02 | Discharge: 2011-07-02 | Disposition: A | Discharge status: Home / Self Care | Payer: Self-pay | Source: Home / Self Care | Attending: Family Medicine | Admitting: Family Medicine

## 2011-07-02 DIAGNOSIS — T7840XA Allergy, unspecified, initial encounter: Secondary | ICD-10-CM

## 2011-07-02 MED ORDER — PREDNISONE (PAK) 10 MG PO TABS: ORAL_TABLET | ORAL | Status: DC

## 2011-07-02 MED ORDER — OLOPATADINE HCL 0.1 % OP SOLN: 1.0000 [drp] | Freq: Two times a day (BID) | OPHTHALMIC | Status: DC

## 2011-07-02 NOTE — ED Notes (Signed)
Pt  Has  Allergies  -  He  Reports  For  About  1  Week  He  Has  Had   Swelling   Irritation of  Face      Especially  Around  Eyes          Skin is  Also  Very  Dry      Seen by pcp  1  Week  Ago  For [employer]

## 2011-07-02 NOTE — ED Provider Notes (Signed)
History     CSN: [id number]  Arrival date & time 07/02/11  1052   First MD Initiated Contact with Patient 07/02/11 1137      Chief Complaint  Patient presents with  . Facial Swelling    (Consider location/radiation/quality/duration/timing/severity/associated sxs/prior treatment) HPI Comments: The patient reports having a long his of allergies to his periorbital area. States he has seen an opthal in past and was given patanol that helped. He is requesting a refill. Denies any hx of discharge or vision changes. He has chronic chf and liver disease.   The history is provided by the patient.    Past Medical History  Diagnosis Date  . DCM (dilated cardiomyopathy)   . LV dysfunction     with an EF in 2007 at 34%  . CHF (congestive heart failure)   . HTN (hypertension)   . Dyslipidemia     Past Surgical History  Procedure Date  . Repeat echo 2008     demonstrated near normalization  . Pacemaker insertion   . Transthoracic echocardiogram 11/09/2005, 01/15/2009, 02/07/2010    History reviewed. No pertinent family history.  History  Substance Use Topics  . Smoking status: Former Smoker    Quit date: 05/17/2009  . Smokeless tobacco: Not on file  . Alcohol Use: No     Denies      Review of Systems  Constitutional: Negative.   HENT: Positive for facial swelling.   Eyes: Positive for redness and itching. Negative for photophobia, pain, discharge and visual disturbance.  Respiratory: Negative.   Cardiovascular: Negative.   Neurological: Negative.     Allergies  Review of patient's allergies indicates no known allergies.  Home Medications   Current Outpatient Rx  Name Route Sig Dispense Refill  . AMIODARONE HCL 200 MG PO TABS Oral Take 200 mg by mouth daily.      . ASPIRIN 81 MG PO TABS Oral Take 81 mg by mouth 2 (two) times daily.      Marland Kitchen CARVEDILOL 12.5 MG PO TABS Oral Take 12.5 mg by mouth 2 (two) times daily with a meal.      . LISINOPRIL 10 MG PO TABS Oral  Take 10 mg by mouth 2 (two) times daily.      . OLOPATADINE HCL 0.1 % OP SOLN Both Eyes Place 1 drop into both eyes 2 (two) times daily. 5 mL 12  . PREDNISONE (PAK) 10 MG PO TABS  Dispense a 6 day dose pk  Take as directed with food 21 tablet 0    BP 110/75  Pulse 70  Temp(Src) 98.3 F (36.8 C) (Oral)  Resp 18  SpO2 97%  Physical Exam  Nursing note and vitals reviewed. Constitutional: He appears well-developed and well-nourished. No distress.  HENT:  Head: Normocephalic.  Eyes: Pupils are equal, round, and reactive to light.       Skin thickened in periorbital area. Conjunctiva injected. Slight swelling of the face  Cardiovascular: Normal rate and regular rhythm.   Pulmonary/Chest: Effort normal and breath sounds normal.    ED Course  Procedures (including critical care time)  Labs Reviewed - No data to display No results found.   1. Allergic reaction       MDM          Randa Spike, MD 07/02/11 1325

## 2011-08-23 ENCOUNTER — Encounter: Payer: Self-pay | Admitting: *Deleted

## 2011-08-25 ENCOUNTER — Encounter: Payer: Self-pay | Admitting: *Deleted

## 2011-09-01 ENCOUNTER — Encounter: Payer: Self-pay | Admitting: *Deleted

## 2011-09-23 ENCOUNTER — Ambulatory Visit (HOSPITAL_COMMUNITY)
Admission: RE | Admit: 2011-09-23 | Discharge: 2011-09-23 | Disposition: A | Discharge status: Home / Self Care | Payer: Medicaid Other | Source: Ambulatory Visit | Attending: Family Medicine | Admitting: Family Medicine

## 2011-09-23 ENCOUNTER — Other Ambulatory Visit (HOSPITAL_COMMUNITY): Payer: Self-pay | Admitting: Family Medicine

## 2011-09-23 DIAGNOSIS — R0989 Other specified symptoms and signs involving the circulatory and respiratory systems: Secondary | ICD-10-CM | POA: Insufficient documentation

## 2011-09-23 DIAGNOSIS — R0602 Shortness of breath: Secondary | ICD-10-CM | POA: Insufficient documentation

## 2011-09-23 DIAGNOSIS — J45909 Unspecified asthma, uncomplicated: Secondary | ICD-10-CM | POA: Insufficient documentation

## 2011-09-23 DIAGNOSIS — I10 Essential (primary) hypertension: Secondary | ICD-10-CM | POA: Insufficient documentation

## 2011-09-23 DIAGNOSIS — Z950 Presence of cardiac pacemaker: Secondary | ICD-10-CM | POA: Insufficient documentation

## 2011-10-24 ENCOUNTER — Emergency Department (HOSPITAL_COMMUNITY): Payer: Medicaid Other

## 2011-10-24 ENCOUNTER — Encounter (HOSPITAL_COMMUNITY): Payer: Self-pay | Admitting: Emergency Medicine

## 2011-10-24 ENCOUNTER — Emergency Department (HOSPITAL_COMMUNITY)
Admission: EM | Admit: 2011-10-24 | Discharge: 2011-10-24 | Disposition: A | Discharge status: Home / Self Care | Payer: Medicaid Other | Attending: Emergency Medicine | Admitting: Emergency Medicine

## 2011-10-24 DIAGNOSIS — Z7982 Long term (current) use of aspirin: Secondary | ICD-10-CM | POA: Insufficient documentation

## 2011-10-24 DIAGNOSIS — J189 Pneumonia, unspecified organism: Secondary | ICD-10-CM

## 2011-10-24 DIAGNOSIS — R05 Cough: Secondary | ICD-10-CM | POA: Insufficient documentation

## 2011-10-24 DIAGNOSIS — R5381 Other malaise: Secondary | ICD-10-CM | POA: Insufficient documentation

## 2011-10-24 DIAGNOSIS — R059 Cough, unspecified: Secondary | ICD-10-CM | POA: Insufficient documentation

## 2011-10-24 DIAGNOSIS — R062 Wheezing: Secondary | ICD-10-CM | POA: Insufficient documentation

## 2011-10-24 DIAGNOSIS — I10 Essential (primary) hypertension: Secondary | ICD-10-CM | POA: Insufficient documentation

## 2011-10-24 DIAGNOSIS — R5383 Other fatigue: Secondary | ICD-10-CM | POA: Insufficient documentation

## 2011-10-24 DIAGNOSIS — I509 Heart failure, unspecified: Secondary | ICD-10-CM | POA: Insufficient documentation

## 2011-10-24 DIAGNOSIS — R197 Diarrhea, unspecified: Secondary | ICD-10-CM | POA: Insufficient documentation

## 2011-10-24 DIAGNOSIS — Z79899 Other long term (current) drug therapy: Secondary | ICD-10-CM | POA: Insufficient documentation

## 2011-10-24 DIAGNOSIS — R0602 Shortness of breath: Secondary | ICD-10-CM | POA: Insufficient documentation

## 2011-10-24 DIAGNOSIS — R112 Nausea with vomiting, unspecified: Secondary | ICD-10-CM

## 2011-10-24 DIAGNOSIS — E785 Hyperlipidemia, unspecified: Secondary | ICD-10-CM | POA: Insufficient documentation

## 2011-10-24 LAB — CBC
MCH: 29.7 pg (ref 26.0–34.0)
MCV: 82.8 fL (ref 78.0–100.0)
Platelets: 281 10*3/uL (ref 150–400)
RBC: 5.63 MIL/uL (ref 4.22–5.81)
RDW: 13.9 % (ref 11.5–15.5)

## 2011-10-24 LAB — DIFFERENTIAL
Basophils Absolute: 0 10*3/uL (ref 0.0–0.1)
Basophils Relative: 1 % (ref 0–1)
Eosinophils Absolute: 1 10*3/uL — ABNORMAL HIGH (ref 0.0–0.7)
Eosinophils Relative: 15 % — ABNORMAL HIGH (ref 0–5)
Lymphs Abs: 1.5 10*3/uL (ref 0.7–4.0)
Neutrophils Relative %: 51 % (ref 43–77)

## 2011-10-24 LAB — BASIC METABOLIC PANEL
CO2: 24 mEq/L (ref 19–32)
Calcium: 8.9 mg/dL (ref 8.4–10.5)
Creatinine, Ser: 1.29 mg/dL (ref 0.50–1.35)
Glucose, Bld: 122 mg/dL — ABNORMAL HIGH (ref 70–99)

## 2011-10-24 MED ORDER — ALBUTEROL SULFATE (5 MG/ML) 0.5% IN NEBU
5.0000 mg | INHALATION_SOLUTION | Freq: Once | RESPIRATORY_TRACT | Status: AC
  Administered 2011-10-24: 5 mg via RESPIRATORY_TRACT

## 2011-10-24 MED ORDER — ALBUTEROL SULFATE HFA 108 (90 BASE) MCG/ACT IN AERS
2.0000 | INHALATION_SPRAY | RESPIRATORY_TRACT | Status: DC | PRN
Start: 2011-10-24 — End: 2011-10-24
  Administered 2011-10-24: 2 via RESPIRATORY_TRACT
  Filled 2011-10-24: qty 6.7

## 2011-10-24 MED ORDER — ONDANSETRON 8 MG PO TBDP: 8.0000 mg | ORAL_TABLET | Freq: Three times a day (TID) | ORAL | Status: AC | PRN

## 2011-10-24 MED ORDER — ALBUTEROL SULFATE (5 MG/ML) 0.5% IN NEBU
INHALATION_SOLUTION | RESPIRATORY_TRACT | Status: AC
  Administered 2011-10-24: 5 mg via RESPIRATORY_TRACT
  Filled 2011-10-24: qty 1

## 2011-10-24 MED ORDER — AMOXICILLIN-POT CLAVULANATE 875-125 MG PO TABS: 1.0000 | ORAL_TABLET | Freq: Two times a day (BID) | ORAL | Status: AC

## 2011-10-24 MED ORDER — AZITHROMYCIN 250 MG PO TABS: 250.0000 mg | ORAL_TABLET | Freq: Every day | ORAL | Status: DC

## 2011-10-24 NOTE — ED Provider Notes (Signed)
History     CSN: [id number]  Arrival date & time 10/24/11  1725   First MD Initiated Contact with Patient 10/24/11 1821      Chief Complaint  Patient presents with  . Shortness of Breath  . Diarrhea    (Consider location/radiation/quality/duration/timing/severity/associated sxs/prior treatment) Patient is a 41 y.o. male presenting with shortness of breath and diarrhea. The history is provided by the patient.  Shortness of Breath  The current episode started yesterday. Associated symptoms include shortness of breath. Pertinent negatives include no chest pain.  Diarrhea The primary symptoms include nausea and diarrhea. Primary symptoms do not include abdominal pain, vomiting or rash.  The illness does not include back pain.   patient developed shortness of breath around 9 PM last night leading. States she's had continued symptoms today. Sent a little bit of cough. He's had some wheezing. he's also had a little bit of nauseousness and diarrhea. She's previously had some similar episodes and was started on prednisone. It improves symptoms. No chest pain. He states he feels weak all over. He is a previous history of CHF hypertension and a dilated cardiomyopathy. No lightheadedness or dizziness.  Past Medical History  Diagnosis Date  . DCM (dilated cardiomyopathy)   . LV dysfunction     with an EF in 2007 at 34%  . CHF (congestive heart failure)   . HTN (hypertension)   . Dyslipidemia     Past Surgical History  Procedure Date  . Repeat echo 2008     demonstrated near normalization  . Pacemaker insertion   . Transthoracic echocardiogram 11/09/2005, 01/15/2009, 02/07/2010    No family history on file.  History  Substance Use Topics  . Smoking status: Former Smoker    Quit date: 05/17/2009  . Smokeless tobacco: Not on file  . Alcohol Use: No     Denies      Review of Systems  Constitutional: Negative for activity change and appetite change.  HENT: Negative for neck  stiffness.   Eyes: Negative for pain.  Respiratory: Positive for shortness of breath. Negative for chest tightness.   Cardiovascular: Negative for chest pain and leg swelling.  Gastrointestinal: Positive for nausea and diarrhea. Negative for vomiting and abdominal pain.  Genitourinary: Negative for flank pain.  Musculoskeletal: Negative for back pain.  Skin: Negative for rash.  Neurological: Negative for weakness, numbness and headaches.  Psychiatric/Behavioral: Negative for behavioral problems.    Allergies  Review of patient's allergies indicates no known allergies.  Home Medications   Current Outpatient Rx  Name Route Sig Dispense Refill  . AMIODARONE HCL 200 MG PO TABS Oral Take 200 mg by mouth daily.      . ASPIRIN 81 MG PO TABS Oral Take 81 mg by mouth 2 (two) times daily.      Marland Kitchen CARVEDILOL 12.5 MG PO TABS Oral Take 12.5 mg by mouth 2 (two) times daily with a meal.      . LISINOPRIL 10 MG PO TABS Oral Take 10 mg by mouth 2 (two) times daily.      . OLOPATADINE HCL 0.1 % OP SOLN Both Eyes Place 1 drop into both eyes 2 (two) times daily. 5 mL 12  . AMOXICILLIN-POT CLAVULANATE 875-125 MG PO TABS Oral Take 1 tablet by mouth 2 (two) times daily. 14 tablet 0  . ONDANSETRON 8 MG PO TBDP Oral Take 1 tablet (8 mg total) by mouth every 8 (eight) hours as needed for nausea. 10 tablet 0  . PREDNISONE (  PAK) 10 MG PO TABS  Dispense a 6 day dose pk  Take as directed with food 21 tablet 0    BP 124/81  Pulse 78  Temp(Src) 98.6 F (37 C) (Oral)  Resp 22  SpO2 99%  Physical Exam  Nursing note and vitals reviewed. Constitutional: He is oriented to person, place, and time. He appears well-developed and well-nourished.  HENT:  Head: Normocephalic and atraumatic.  Eyes: EOM are normal. Pupils are equal, round, and reactive to light.  Neck: Normal range of motion. Neck supple.  Cardiovascular: Normal rate, regular rhythm and normal heart sounds.   No murmur heard. Pulmonary/Chest:  Effort normal. He has wheezes.       Mild diffuse wheezes, worse right base.  Abdominal: Soft. Bowel sounds are normal. He exhibits no distension and no mass. There is no tenderness. There is no rebound and no guarding.  Musculoskeletal: Normal range of motion. He exhibits no edema.  Neurological: He is alert and oriented to person, place, and time. No cranial nerve deficit.  Skin: Skin is warm and dry.  Psychiatric: He has a normal mood and affect.    ED Course  Procedures (including critical care time)  Labs Reviewed  DIFFERENTIAL - Abnormal; Notable for the following:    Eosinophils Relative 15 (*)    Eosinophils Absolute 1.0 (*)    All other components within normal limits  BASIC METABOLIC PANEL - Abnormal; Notable for the following:    Glucose, Bld 122 (*)    GFR calc non Af Amer 68 (*)    GFR calc Af Amer 79 (*)    All other components within normal limits  CBC  TROPONIN I   Dg Chest 2 View  10/24/2011  *RADIOLOGY REPORT*  Clinical Data: Short of breath, CHF  CHEST - 2 VIEW  Comparison: Chest radiograph 09/23/2011  Findings: Left-sided pacemaker single continuous lead overlies stable cardiac silhouette. There is mild air space disease in the right lower lobe which could represent mild edema.  No pleural fluid.  No pneumothorax.  IMPRESSION: Mild atelectasis versus infiltrate or edema at the right lung base. Otherwise no acute findings.  Original Report Authenticated By: Genevive Bi, M.D.     1. CAP (community acquired pneumonia)   2. Nausea vomiting and diarrhea      Date: 10/24/2011  Rate: 72  Rhythm: normal sinus rhythm  QRS Axis: normal  Intervals: normal  ST/T Wave abnormalities: normal  Conduction Disutrbances:none  Narrative Interpretation:   Old EKG Reviewed: unchanged    MDM  Patient with shortness of breath. Possible pneumonia on x-ray. He's also having some wheezing. He was treated with antibiotics. He'll be given inhaler. Also some nausea and  diarrhea. Patient will be discharged home to followup with his Dr.        Harrold Donath R. Rubin Payor, MD 10/24/11 [phone]

## 2011-10-24 NOTE — ED Provider Notes (Deleted)
  Physical Exam  BP 124/81  Pulse 78  Temp(Src) 98.6 F (37 C) (Oral)  Resp 22  SpO2 99%  Physical Exam  ED Course  Procedures  MDM Patient states he developed shortness of breath last night while eating [place] food. States continued to have some other symptoms. Is also had a rash for a while on his forearms. No chest pain. He states he has had some nausea vomiting diarrhea. No fevers. Has a previous history of dilated cardiomyopathy. No history of asthma or COPD. He is a former smoker.      Juliet Rude. Rubin Payor, MD 10/24/11 [phone]

## 2011-10-24 NOTE — ED Notes (Signed)
Reports "heavy" breathing that started last night around 9pm while eating [place] food at a restaurant.  States symptoms have continued today.  Reports diarrhea since this morning.

## 2011-10-24 NOTE — Discharge Instructions (Signed)
Nausea and Vomiting Nausea means you feel sick to your stomach. Throwing up (vomiting) is a reflex where stomach contents come out of your mouth. HOME CARE   Take medicine as told by your doctor.   Do not force yourself to eat. However, you do need to drink fluids.   If you feel like eating, eat a normal diet as told by your doctor.   Eat rice, wheat, potatoes, bread, lean meats, yogurt, fruits, and vegetables.   Avoid high-fat foods.   Drink enough fluids to keep your pee (urine) clear or pale yellow.   Ask your doctor how to replace body fluid losses (rehydrate). Signs of body fluid loss (dehydration) include:   Feeling very thirsty.   Dry lips and mouth.   Feeling dizzy.   Dark pee.   Peeing less than normal.   Feeling confused.   Fast breathing or heart rate.  GET HELP RIGHT AWAY IF:   You have blood in your throw up.   You have black or bloody poop (stool).   You have a bad headache or stiff neck.   You feel confused.   You have bad belly (abdominal) pain.   You have chest pain or trouble breathing.   You do not pee at least once every 8 hours.   You have cold, clammy skin.   You keep throwing up after 24 to 48 hours.   You have a fever.  MAKE SURE YOU:   Understand these instructions.   Will watch your condition.   Will get help right away if you are not doing well or get worse.  Document Released: 12/15/2007 Document Revised: 06/17/2011 Document Reviewed: 11/27/2010 [facility] Patient Information 2012 [place], [place].Pneumonia, Adult Pneumonia is an infection of the lungs.  CAUSES Pneumonia may be caused by bacteria or a virus. Usually, these infections are caused by breathing infectious particles into the lungs (respiratory tract). SYMPTOMS   Cough.   Fever.   Chest pain.   Increased rate of breathing.   Wheezing.   Mucus production.  DIAGNOSIS  If you have the common symptoms of pneumonia, your caregiver will typically confirm the  diagnosis with a chest X-ray. The X-ray will show an abnormality in the lung (pulmonary infiltrate) if you have pneumonia. Other tests of your blood, urine, or sputum may be done to find the specific cause of your pneumonia. Your caregiver may also do tests (blood gases or pulse oximetry) to see how well your lungs are working. TREATMENT  Some forms of pneumonia may be spread to other people when you cough or sneeze. You may be asked to wear a mask before and during your exam. Pneumonia that is caused by bacteria is treated with antibiotic medicine. Pneumonia that is caused by the influenza virus may be treated with an antiviral medicine. Most other viral infections must run their course. These infections will not respond to antibiotics.  PREVENTION A pneumococcal shot (vaccine) is available to prevent a common bacterial cause of pneumonia. This is usually suggested for:  People over 40 years old.   Patients on chemotherapy.   People with chronic lung problems, such as bronchitis or emphysema.   People with immune system problems.  If you are over 65 or have a high risk condition, you may receive the pneumococcal vaccine if you have not received it before. In some countries, a routine influenza vaccine is also recommended. This vaccine can help prevent some cases of pneumonia.You may be offered the influenza vaccine as part  of your care. If you smoke, it is time to quit. You may receive instructions on how to stop smoking. Your caregiver can provide medicines and counseling to help you quit. HOME CARE INSTRUCTIONS   Cough suppressants may be used if you are losing too much rest. However, coughing protects you by clearing your lungs. You should avoid using cough suppressants if you can.   Your caregiver may have prescribed medicine if he or she thinks your pneumonia is caused by a bacteria or influenza. Finish your medicine even if you start to feel better.   Your caregiver may also prescribe an  expectorant. This loosens the mucus to be coughed up.   Only take over-the-counter or prescription medicines for pain, discomfort, or fever as directed by your caregiver.   Do not smoke. Smoking is a common cause of bronchitis and can contribute to pneumonia. If you are a smoker and continue to smoke, your cough may last several weeks after your pneumonia has cleared.   A cold steam vaporizer or humidifier in your room or home may help loosen mucus.   Coughing is often worse at night. Sleeping in a semi-upright position in a recliner or using a couple pillows under your head will help with this.   Get rest as you feel it is needed. Your body will usually let you know when you need to rest.  SEEK IMMEDIATE MEDICAL CARE IF:   Your illness becomes worse. This is especially true if you are elderly or weakened from any other disease.   You cannot control your cough with suppressants and are losing sleep.   You begin coughing up blood.   You develop pain which is getting worse or is uncontrolled with medicines.   You have a fever.   Any of the symptoms which initially brought you in for treatment are getting worse rather than better.   You develop shortness of breath or chest pain.  MAKE SURE YOU:   Understand these instructions.   Will watch your condition.   Will get help right away if you are not doing well or get worse.  Document Released: 06/28/2005 Document Revised: 06/17/2011 Document Reviewed: 09/17/2010 [facility] Patient Information 2012 [place], [place].

## 2012-05-27 ENCOUNTER — Encounter (HOSPITAL_COMMUNITY): Payer: Self-pay | Admitting: Emergency Medicine

## 2012-05-27 ENCOUNTER — Emergency Department (HOSPITAL_COMMUNITY): Payer: Self-pay

## 2012-05-27 ENCOUNTER — Emergency Department (HOSPITAL_COMMUNITY)
Admission: EM | Admit: 2012-05-27 | Discharge: 2012-05-27 | Disposition: A | Discharge status: Home / Self Care | Payer: Self-pay | Attending: Emergency Medicine | Admitting: Emergency Medicine

## 2012-05-27 DIAGNOSIS — I509 Heart failure, unspecified: Secondary | ICD-10-CM | POA: Insufficient documentation

## 2012-05-27 DIAGNOSIS — N200 Calculus of kidney: Secondary | ICD-10-CM | POA: Insufficient documentation

## 2012-05-27 DIAGNOSIS — R0602 Shortness of breath: Secondary | ICD-10-CM | POA: Insufficient documentation

## 2012-05-27 DIAGNOSIS — R059 Cough, unspecified: Secondary | ICD-10-CM | POA: Insufficient documentation

## 2012-05-27 DIAGNOSIS — Z7982 Long term (current) use of aspirin: Secondary | ICD-10-CM | POA: Insufficient documentation

## 2012-05-27 DIAGNOSIS — R112 Nausea with vomiting, unspecified: Secondary | ICD-10-CM | POA: Insufficient documentation

## 2012-05-27 DIAGNOSIS — R05 Cough: Secondary | ICD-10-CM | POA: Insufficient documentation

## 2012-05-27 DIAGNOSIS — I428 Other cardiomyopathies: Secondary | ICD-10-CM | POA: Insufficient documentation

## 2012-05-27 DIAGNOSIS — Z87891 Personal history of nicotine dependence: Secondary | ICD-10-CM | POA: Insufficient documentation

## 2012-05-27 DIAGNOSIS — I10 Essential (primary) hypertension: Secondary | ICD-10-CM | POA: Insufficient documentation

## 2012-05-27 DIAGNOSIS — Z79899 Other long term (current) drug therapy: Secondary | ICD-10-CM | POA: Insufficient documentation

## 2012-05-27 LAB — CBC
HCT: 44.7 % (ref 39.0–52.0)
Hemoglobin: 15.7 g/dL (ref 13.0–17.0)
MCHC: 35.1 g/dL (ref 30.0–36.0)
MCV: 78.8 fL (ref 78.0–100.0)
RDW: 13.8 % (ref 11.5–15.5)
WBC: 8.3 10*3/uL (ref 4.0–10.5)

## 2012-05-27 LAB — URINALYSIS, ROUTINE W REFLEX MICROSCOPIC
Glucose, UA: NEGATIVE mg/dL
Leukocytes, UA: NEGATIVE
Nitrite: NEGATIVE
Protein, ur: 30 mg/dL — AB
Urobilinogen, UA: 0.2 mg/dL (ref 0.0–1.0)

## 2012-05-27 LAB — COMPREHENSIVE METABOLIC PANEL
Albumin: 3.5 g/dL (ref 3.5–5.2)
BUN: 19 mg/dL (ref 6–23)
Chloride: 103 mEq/L (ref 96–112)
Creatinine, Ser: 1.15 mg/dL (ref 0.50–1.35)
GFR calc Af Amer: >90 mL/min (ref >90)
GFR calc non Af Amer: 78 mL/min — ABNORMAL LOW (ref >90)
Glucose, Bld: 119 mg/dL — ABNORMAL HIGH (ref 70–99)
Total Bilirubin: 0.4 mg/dL (ref 0.3–1.2)

## 2012-05-27 LAB — POCT I-STAT TROPONIN I: Troponin i, poc: 0 ng/mL (ref 0.00–0.08)

## 2012-05-27 MED ORDER — ONDANSETRON HCL 4 MG/2ML IJ SOLN
4.0000 mg | Freq: Once | INTRAMUSCULAR | Status: AC
  Administered 2012-05-27: 4 mg via INTRAVENOUS
  Filled 2012-05-27: qty 2

## 2012-05-27 MED ORDER — OXYCODONE-ACETAMINOPHEN 5-325 MG PO TABS: 2.0000 | ORAL_TABLET | ORAL | Status: DC | PRN

## 2012-05-27 MED ORDER — MORPHINE SULFATE 4 MG/ML IJ SOLN
4.0000 mg | Freq: Once | INTRAMUSCULAR | Status: AC
  Administered 2012-05-27: 4 mg via INTRAVENOUS
  Filled 2012-05-27: qty 1

## 2012-05-27 MED ORDER — KETOROLAC TROMETHAMINE 30 MG/ML IJ SOLN
30.0000 mg | Freq: Once | INTRAMUSCULAR | Status: AC
  Administered 2012-05-27: 30 mg via INTRAVENOUS
  Filled 2012-05-27: qty 1

## 2012-05-27 MED ORDER — ONDANSETRON HCL 4 MG PO TABS: 4.0000 mg | ORAL_TABLET | Freq: Four times a day (QID) | ORAL | Status: DC

## 2012-05-27 NOTE — ED Provider Notes (Signed)
History     CSN: [id number]  Arrival date & time 05/27/12  1825   First MD Initiated Contact with Patient 05/27/12 2033      Chief Complaint  Patient presents with  . Flank Pain  . Shortness of Breath    (Consider location/radiation/quality/duration/timing/severity/associated sxs/prior treatment) HPI Comments: Patient with a history of kidney stones presents with left flank pain. He states the pain started earlier this morning and is intermittent. It's more severe at times. It's a similar type pain to his past renal colic however is never had of the left side. Past kidney stones always been on the right. He has had to have lithotripsy in 2005. He was previously seen [facility]. He denies any urinary symptoms. Denies any fevers or chills. He's had some nausea and vomiting associated with the pain. He also was recently treated for pneumonia. He states he pneumonia symptoms have been markedly better however in the last couple days he feels like his cough is a little bit worse than it was in the past. Burgess Estelle he had a short episode of shortness of breath however he's had no other episodes of shortness of breath. He denies any chest pain or tightness. Denies any leg pain or swelling. Denies any pleuritic-type chest pain.   Past Medical History  Diagnosis Date  . DCM (dilated cardiomyopathy)   . LV dysfunction     with an EF in 2007 at 34%  . CHF (congestive heart failure)   . HTN (hypertension)   . Dyslipidemia     Past Surgical History  Procedure Date  . Repeat echo 2008     demonstrated near normalization  . Pacemaker insertion   . Transthoracic echocardiogram 11/09/2005, 01/15/2009, 02/07/2010    No family history on file.  History  Substance Use Topics  . Smoking status: Former Smoker    Quit date: 05/17/2009  . Smokeless tobacco: Not on file  . Alcohol Use: No     Comment: Denies      Review of Systems  Constitutional: Negative for fever, chills, diaphoresis  and fatigue.  HENT: Negative for congestion, rhinorrhea and sneezing.   Eyes: Negative.   Respiratory: Positive for cough and shortness of breath. Negative for chest tightness.   Cardiovascular: Negative for chest pain and leg swelling.  Gastrointestinal: Positive for nausea, vomiting and abdominal pain (Moderate tenderness in left flank). Negative for diarrhea and blood in stool.  Genitourinary: Negative for frequency, hematuria, flank pain and difficulty urinating.  Musculoskeletal: Negative for back pain and arthralgias.  Skin: Negative for rash.  Neurological: Negative for dizziness, speech difficulty, weakness, numbness and headaches.    Allergies  Latex  Home Medications   Current Outpatient Rx  Name  Route  Sig  Dispense  Refill  . AMIODARONE HCL 200 MG PO TABS   Oral   Take 200 mg by mouth at bedtime.          . ASPIRIN 81 MG PO TABS   Oral   Take 81 mg by mouth 2 (two) times daily.           Marland Kitchen CARVEDILOL 12.5 MG PO TABS   Oral   Take 12.5 mg by mouth 2 (two) times daily with a meal.           . LISINOPRIL 20 MG PO TABS   Oral   Take 20 mg by mouth at bedtime.         Marland Kitchen ONDANSETRON HCL 4 MG PO TABS  Oral   Take 1 tablet (4 mg total) by mouth every 6 (six) hours.   12 tablet   0   . OXYCODONE-ACETAMINOPHEN 5-325 MG PO TABS   Oral   Take 2 tablets by mouth every 4 (four) hours as needed for pain.   20 tablet   0     BP 118/73  Pulse 60  Temp 97.4 F (36.3 C) (Oral)  Resp 18  SpO2 98%  Physical Exam  Constitutional: He is oriented to person, place, and time. He appears well-developed and well-nourished.  HENT:  Head: Normocephalic and atraumatic.  Eyes: Pupils are equal, round, and reactive to light.  Neck: Normal range of motion. Neck supple.  Cardiovascular: Normal rate, regular rhythm and normal heart sounds.   Pulmonary/Chest: Effort normal and breath sounds normal. No respiratory distress. He has no wheezes. He has no rales. He  exhibits no tenderness.  Abdominal: Soft. Bowel sounds are normal. There is tenderness (Positive tenderness in left flank). There is no rebound and no guarding.  Musculoskeletal: Normal range of motion. He exhibits no edema and no tenderness (Negative Homans sign).  Lymphadenopathy:    He has no cervical adenopathy.  Neurological: He is alert and oriented to person, place, and time.  Skin: Skin is warm and dry. No rash noted.  Psychiatric: He has a normal mood and affect.    ED Course  Procedures (including critical care time)  Results for orders placed during the hospital encounter of 05/27/12  CBC      Component Value Range   WBC 8.3  4.0 - 10.5 K/uL   RBC 5.67  4.22 - 5.81 MIL/uL   Hemoglobin 15.7  13.0 - 17.0 g/dL   HCT 16.1  09.6 - 04.5 %   MCV 78.8  78.0 - 100.0 fL   MCH 27.7  26.0 - 34.0 pg   MCHC 35.1  30.0 - 36.0 g/dL   RDW 40.9  81.1 - 91.4 %   Platelets 255  150 - 400 K/uL  COMPREHENSIVE METABOLIC PANEL      Component Value Range   Sodium 137  135 - 145 mEq/L   Potassium 4.4  3.5 - 5.1 mEq/L   Chloride 103  96 - 112 mEq/L   CO2 25  19 - 32 mEq/L   Glucose, Bld 119 (*) 70 - 99 mg/dL   BUN 19  6 - 23 mg/dL   Creatinine, Ser 7.82  0.50 - 1.35 mg/dL   Calcium 9.4  8.4 - 95.6 mg/dL   Total Protein 7.5  6.0 - 8.3 g/dL   Albumin 3.5  3.5 - 5.2 g/dL   AST 22  0 - 37 U/L   ALT 25  0 - 53 U/L   Alkaline Phosphatase 82  39 - 117 U/L   Total Bilirubin 0.4  0.3 - 1.2 mg/dL   GFR calc non Af Amer 78 (*) >90 mL/min   GFR calc Af Amer >90  >90 mL/min  URINALYSIS, ROUTINE W REFLEX MICROSCOPIC      Component Value Range   Color, Urine YELLOW  YELLOW   APPearance CLEAR  CLEAR   Specific Gravity, Urine 1.020  1.005 - 1.030   pH 7.5  5.0 - 8.0   Glucose, UA NEGATIVE  NEGATIVE mg/dL   Hgb urine dipstick LARGE (*) NEGATIVE   Bilirubin Urine NEGATIVE  NEGATIVE   Ketones, ur NEGATIVE  NEGATIVE mg/dL   Protein, ur 30 (*) NEGATIVE mg/dL   Urobilinogen, UA 0.2  0.0 - 1.0 mg/dL    Nitrite NEGATIVE  NEGATIVE   Leukocytes, UA NEGATIVE  NEGATIVE  POCT I-STAT TROPONIN I      Component Value Range   Troponin i, poc 0.00  0.00 - 0.08 ng/mL   Comment 3           URINE MICROSCOPIC-ADD ON      Component Value Range   Squamous Epithelial / LPF RARE  RARE   RBC / HPF TOO NUMEROUS TO COUNT  <3 RBC/hpf   Bacteria, UA RARE  RARE   Ct Abdomen Pelvis Wo Contrast  05/27/2012  *RADIOLOGY REPORT*  Clinical Data: Left flank pain  CT ABDOMEN AND PELVIS WITHOUT CONTRAST  Technique:  Multidetector CT imaging of the abdomen and pelvis was performed following the standard protocol without intravenous contrast.  Comparison: 02/09/2005  Findings: Lung bases are predominately clear.  Partially imaged pacing wire terminating within the right ventricle.  No pleural or pericardial effusion.  Organ abnormality/lesion detection is limited in the absence of intravenous contrast. Within this limitation, unremarkable liver. Enlarged spleen at 14.7 cm cranial caudal.  Unremarkable biliary system, pancreas, adrenal glands.  Nonobstructing stones on the right.  No right sided hydroureteronephrosis.  The left kidney is edematous with perinephric fat stranding and mild hydroureteronephrosis to the level of a 2 mm stone within the mid left ureter.  No bowel obstruction.  No CT evidence for colitis.  Normal appendix.  No free intraperitoneal air or fluid.  No lymphadenopathy.  Normal caliber vasculature.  Partially decompressed bladder.  No acute osseous finding.  IMPRESSION: Mild left hydroureteronephrosis to the level of a 2 mm mid left ureteral stone.  Nonobstructing right renal stones.  Nonspecific splenomegaly.   Original Report Authenticated By: Jearld Lesch, M.D.    Dg Chest 2 View  05/27/2012  *RADIOLOGY REPORT*  Clinical Data: Shortness of breath.  Left-sided chest pain.  CHEST - 2 VIEW  Comparison: 10/24/2011 and 08/19/2010  Findings: AICD enters from the left with lead in the region of the right atrium  without interruption or change.  Heart size within normal limits.  Central pulmonary vascular prominence.  Mild chronic bronchitis type changes.  No segmental infiltrate or pneumothorax.  IMPRESSION: Mild chronic bronchitis type changes.  Stable appearance of the AICD.   Original Report Authenticated By: Lacy Duverney, M.D.       Date: 05/27/2012  Rate: 58  Rhythm: sinus bradycardia  QRS Axis: normal  Intervals: normal  ST/T Wave abnormalities: nonspecific ST/T changes  Conduction Disutrbances:none  Narrative Interpretation:   Old EKG Reviewed: unchanged    1. Kidney stone       MDM  Patient with a 2 mm mid left ureteral stone. There is no evidence of infection or elevated creatinine. He is well pain controlled. His chest x-ray did not show any evidence of pneumonia or congestive changes. Denies any shortness of breath or chest pain currently. We'll give him a prescription for pain medicine and he will followup with his urologist at [facility] u[facility]        Rolan Bucco, MD 05/27/12 [phone]

## 2012-05-27 NOTE — ED Notes (Signed)
Pt presents with shortness of breath related to a cough for the past week, was treated for pneumonia a month ago.  Pt also having left flank pain, states his urine appears concentrated, has had kidney stones in the past.

## 2012-05-27 NOTE — ED Notes (Signed)
Pt returned from ct

## 2012-06-02 ENCOUNTER — Encounter (HOSPITAL_COMMUNITY): Payer: Self-pay | Admitting: *Deleted

## 2012-06-02 ENCOUNTER — Emergency Department (HOSPITAL_COMMUNITY)
Admission: EM | Admit: 2012-06-02 | Discharge: 2012-06-02 | Disposition: A | Discharge status: Home / Self Care | Payer: Self-pay | Attending: Emergency Medicine | Admitting: Emergency Medicine

## 2012-06-02 ENCOUNTER — Emergency Department (HOSPITAL_COMMUNITY): Payer: Self-pay

## 2012-06-02 DIAGNOSIS — Z87891 Personal history of nicotine dependence: Secondary | ICD-10-CM | POA: Insufficient documentation

## 2012-06-02 DIAGNOSIS — I509 Heart failure, unspecified: Secondary | ICD-10-CM | POA: Insufficient documentation

## 2012-06-02 DIAGNOSIS — E785 Hyperlipidemia, unspecified: Secondary | ICD-10-CM | POA: Insufficient documentation

## 2012-06-02 DIAGNOSIS — I10 Essential (primary) hypertension: Secondary | ICD-10-CM | POA: Insufficient documentation

## 2012-06-02 DIAGNOSIS — I428 Other cardiomyopathies: Secondary | ICD-10-CM | POA: Insufficient documentation

## 2012-06-02 DIAGNOSIS — Z7982 Long term (current) use of aspirin: Secondary | ICD-10-CM | POA: Insufficient documentation

## 2012-06-02 DIAGNOSIS — R059 Cough, unspecified: Secondary | ICD-10-CM | POA: Insufficient documentation

## 2012-06-02 DIAGNOSIS — R05 Cough: Secondary | ICD-10-CM | POA: Insufficient documentation

## 2012-06-02 LAB — CBC WITH DIFFERENTIAL/PLATELET
Basophils Relative: 1 % (ref 0–1)
Eosinophils Absolute: 1.2 10*3/uL — ABNORMAL HIGH (ref 0.0–0.7)
HCT: 45.7 % (ref 39.0–52.0)
Hemoglobin: 15.8 g/dL (ref 13.0–17.0)
Lymphs Abs: 3.3 10*3/uL (ref 0.7–4.0)
MCH: 27.8 pg (ref 26.0–34.0)
MCHC: 34.6 g/dL (ref 30.0–36.0)
MCV: 80.3 fL (ref 78.0–100.0)
Monocytes Absolute: 0.8 10*3/uL (ref 0.1–1.0)
Monocytes Relative: 9 % (ref 3–12)
RBC: 5.69 MIL/uL (ref 4.22–5.81)

## 2012-06-02 LAB — BASIC METABOLIC PANEL
BUN: 20 mg/dL (ref 6–23)
Creatinine, Ser: 1.24 mg/dL (ref 0.50–1.35)
GFR calc Af Amer: 83 mL/min — ABNORMAL LOW (ref >90)
GFR calc non Af Amer: 71 mL/min — ABNORMAL LOW (ref >90)
Glucose, Bld: 85 mg/dL (ref 70–99)

## 2012-06-02 LAB — POCT I-STAT TROPONIN I

## 2012-06-02 MED ORDER — PREDNISONE 20 MG PO TABS: 60.0000 mg | ORAL_TABLET | Freq: Every day | ORAL | Status: DC

## 2012-06-02 MED ORDER — PREDNISONE 20 MG PO TABS
60.0000 mg | ORAL_TABLET | Freq: Once | ORAL | Status: AC
  Administered 2012-06-02: 60 mg via ORAL
  Filled 2012-06-02: qty 3

## 2012-06-02 MED ORDER — ALBUTEROL SULFATE (5 MG/ML) 0.5% IN NEBU
5.0000 mg | INHALATION_SOLUTION | Freq: Once | RESPIRATORY_TRACT | Status: AC
  Administered 2012-06-02: 5 mg via RESPIRATORY_TRACT
  Filled 2012-06-02: qty 1

## 2012-06-02 MED ORDER — ALBUTEROL SULFATE HFA 108 (90 BASE) MCG/ACT IN AERS: 1.0000 | INHALATION_SPRAY | Freq: Four times a day (QID) | RESPIRATORY_TRACT | Status: DC | PRN

## 2012-06-02 MED ORDER — IPRATROPIUM BROMIDE 0.02 % IN SOLN
0.5000 mg | Freq: Once | RESPIRATORY_TRACT | Status: AC
  Administered 2012-06-02: 0.5 mg via RESPIRATORY_TRACT
  Filled 2012-06-02: qty 2.5

## 2012-06-02 MED ORDER — BENZONATATE 100 MG PO CAPS: 100.0000 mg | ORAL_CAPSULE | Freq: Three times a day (TID) | ORAL | Status: DC

## 2012-06-02 NOTE — ED Notes (Signed)
Pt reports having PNA a couple of weeks ago and being treated for it at [facility]. Reports developing a productive cough and cold like symptoms a few days ago. Reports using his inhaler this am, but has not taken any OTC cold medicine for the cold symptoms.

## 2012-06-02 NOTE — ED Provider Notes (Signed)
History    CSN: [id number] Arrival date & time 06/02/12  1629 First MD Initiated Contact with Patient 06/02/12 1659    Chief Complaint  Patient presents with  . Cough   Patient is a 41 y.o. male presenting with cough. The history is provided by the patient.  Cough This is a recurrent problem. Episode onset: several weeks ago.  He was seen in the ED and  was treated with PO abx.  Symptoms persisted.  He saw his doctor and was prescribed steroids and additional abx.  Symptoms got better but then returned last night.   Progression since onset: Today he is feeling better but last night it was worse. The cough is non-productive. There has been no fever. Associated symptoms include shortness of breath and wheezing. Pertinent negatives include no chest pain and no ear congestion. He is not a smoker. His past medical history is significant for pneumonia. His past medical history does not include asthma.  He does have a history of dilated cardiomyopathy.  He denies weight gain or chest pain.  No swelling noted.  Past Medical History  Diagnosis Date  . DCM (dilated cardiomyopathy)   . LV dysfunction     with an EF in 2007 at 34%  . CHF (congestive heart failure)   . HTN (hypertension)   . Dyslipidemia     Past Surgical History  Procedure Date  . Repeat echo 2008     demonstrated near normalization  . Pacemaker insertion   . Transthoracic echocardiogram 11/09/2005, 01/15/2009, 02/07/2010    History reviewed. No pertinent family history.  History  Substance Use Topics  . Smoking status: Former Smoker    Quit date: 05/17/2009  . Smokeless tobacco: Not on file  . Alcohol Use: No     Comment: Denies      Review of Systems  Respiratory: Positive for cough, shortness of breath and wheezing.   Cardiovascular: Negative for chest pain.    Allergies  Latex  Home Medications   Current Outpatient Rx  Name  Route  Sig  Dispense  Refill  . AMIODARONE HCL 200 MG PO TABS   Oral   Take  200 mg by mouth at bedtime.          . ASPIRIN 81 MG PO TABS   Oral   Take 81 mg by mouth 2 (two) times daily.           Marland Kitchen CARVEDILOL 12.5 MG PO TABS   Oral   Take 12.5 mg by mouth 2 (two) times daily with a meal.           . LISINOPRIL 20 MG PO TABS   Oral   Take 20 mg by mouth at bedtime.           BP 104/68  Pulse 64  Temp 98.9 F (37.2 C) (Oral)  Resp 20  SpO2 94%  Physical Exam  Nursing note and vitals reviewed. Constitutional: He appears well-developed and well-nourished. No distress.  HENT:  Head: Normocephalic and atraumatic.  Right Ear: External ear normal.  Left Ear: External ear normal.  Eyes: Conjunctivae normal are normal. Right eye exhibits no discharge. Left eye exhibits no discharge. No scleral icterus.  Neck: Neck supple. No tracheal deviation present.  Cardiovascular: Normal rate, regular rhythm and intact distal pulses.   Pulmonary/Chest: Effort normal. No stridor. No respiratory distress. He has wheezes (end expiration). He has no rales.  Abdominal: Soft. Bowel sounds are normal. He exhibits no distension. There  is no tenderness. There is no rebound and no guarding.  Musculoskeletal: He exhibits no edema and no tenderness.  Neurological: He is alert. He has normal strength. No sensory deficit. Cranial nerve deficit:  no gross defecits noted. He exhibits normal muscle tone. He displays no seizure activity. Coordination normal.  Skin: Skin is warm and dry. No rash noted.  Psychiatric: He has a normal mood and affect.    ED Course  Procedures (including critical care time)  Rate: 59  Rhythm: sinus bradycardia  QRS Axis: normal  Intervals: normal  ST/T Wave abnormalities: normal  Conduction Disutrbances:none  Narrative Interpretation: nl  Old EKG Reviewed: no changes  Labs Reviewed  CBC WITH DIFFERENTIAL - Abnormal; Notable for the following:    Neutrophils Relative 37 (*)     Eosinophils Relative 15 (*)     Eosinophils Absolute 1.2 (*)      All other components within normal limits  BASIC METABOLIC PANEL - Abnormal; Notable for the following:    GFR calc non Af Amer 71 (*)     GFR calc Af Amer 83 (*)     All other components within normal limits  PRO B NATRIURETIC PEPTIDE  POCT I-STAT TROPONIN I   Dg Chest 2 View  06/02/2012  *RADIOLOGY REPORT*  Clinical Data: Cough.  CHEST - 2 VIEW  Comparison: 05/27/2012  Findings: Stable chronic interstitial prominence without evidence of edema or focal infiltrate.  Pacing / ICD device is stable in appearance.  The heart size is within normal limits.  No pleural effusions.  IMPRESSION: Stable chronic lung disease.   Original Report Authenticated By: Irish Lack, M.D.      1. Cough       MDM  No PNA on CXR.  No sign of CHF.  Pt is on amiodarone which can cause pulmonary fibrosis.  He may benefit from pulmonary testing.  Will have him follow up with his cardiologist.  Pt does have bronchospasm today which may end up being the primary cause, possibly related to viral etiology.       Celene Kras, MD 06/02/12 [phone]

## 2012-06-02 NOTE — ED Notes (Signed)
Patient is alert and orientedx4.  Patient was explained discharge instructions and he had no questions.  Patient is being transported home by cousin, [name].

## 2012-06-02 NOTE — ED Notes (Signed)
Patient transported to X-ray 

## 2012-06-02 NOTE — ED Notes (Signed)
Pt in c/o cough and congestion x3 days

## 2012-08-26 ENCOUNTER — Other Ambulatory Visit: Payer: Self-pay

## 2012-10-21 ENCOUNTER — Encounter (HOSPITAL_COMMUNITY): Payer: Self-pay | Admitting: *Deleted

## 2012-10-21 ENCOUNTER — Emergency Department (HOSPITAL_COMMUNITY)
Admission: EM | Admit: 2012-10-21 | Discharge: 2012-10-22 | Disposition: A | Discharge status: Home / Self Care | Payer: Self-pay | Attending: Emergency Medicine | Admitting: Emergency Medicine

## 2012-10-21 DIAGNOSIS — I519 Heart disease, unspecified: Secondary | ICD-10-CM | POA: Insufficient documentation

## 2012-10-21 DIAGNOSIS — R062 Wheezing: Secondary | ICD-10-CM | POA: Insufficient documentation

## 2012-10-21 DIAGNOSIS — I10 Essential (primary) hypertension: Secondary | ICD-10-CM | POA: Insufficient documentation

## 2012-10-21 DIAGNOSIS — Z8679 Personal history of other diseases of the circulatory system: Secondary | ICD-10-CM | POA: Insufficient documentation

## 2012-10-21 DIAGNOSIS — H579 Unspecified disorder of eye and adnexa: Secondary | ICD-10-CM | POA: Insufficient documentation

## 2012-10-21 DIAGNOSIS — J3489 Other specified disorders of nose and nasal sinuses: Secondary | ICD-10-CM | POA: Insufficient documentation

## 2012-10-21 DIAGNOSIS — Z7982 Long term (current) use of aspirin: Secondary | ICD-10-CM | POA: Insufficient documentation

## 2012-10-21 DIAGNOSIS — Z8639 Personal history of other endocrine, nutritional and metabolic disease: Secondary | ICD-10-CM | POA: Insufficient documentation

## 2012-10-21 DIAGNOSIS — Z862 Personal history of diseases of the blood and blood-forming organs and certain disorders involving the immune mechanism: Secondary | ICD-10-CM | POA: Insufficient documentation

## 2012-10-21 DIAGNOSIS — J9801 Acute bronchospasm: Secondary | ICD-10-CM | POA: Insufficient documentation

## 2012-10-21 DIAGNOSIS — Z87891 Personal history of nicotine dependence: Secondary | ICD-10-CM | POA: Insufficient documentation

## 2012-10-21 DIAGNOSIS — Z950 Presence of cardiac pacemaker: Secondary | ICD-10-CM | POA: Insufficient documentation

## 2012-10-21 DIAGNOSIS — I509 Heart failure, unspecified: Secondary | ICD-10-CM | POA: Insufficient documentation

## 2012-10-21 DIAGNOSIS — Z79899 Other long term (current) drug therapy: Secondary | ICD-10-CM | POA: Insufficient documentation

## 2012-10-21 MED ORDER — IPRATROPIUM BROMIDE 0.02 % IN SOLN
0.5000 mg | Freq: Once | RESPIRATORY_TRACT | Status: AC
  Administered 2012-10-21: 0.5 mg via RESPIRATORY_TRACT

## 2012-10-21 MED ORDER — ALBUTEROL SULFATE (5 MG/ML) 0.5% IN NEBU
5.0000 mg | INHALATION_SOLUTION | Freq: Once | RESPIRATORY_TRACT | Status: AC
  Administered 2012-10-21: 5 mg via RESPIRATORY_TRACT

## 2012-10-21 NOTE — ED Notes (Signed)
Pt c/o SOB since this afternoon, states he does not have hx of asthma, audible wheezing and wheezing in upper fields

## 2012-10-22 ENCOUNTER — Emergency Department (HOSPITAL_COMMUNITY): Payer: Self-pay

## 2012-10-22 LAB — BASIC METABOLIC PANEL
CO2: 25 mEq/L (ref 19–32)
Calcium: 9.5 mg/dL (ref 8.4–10.5)
GFR calc non Af Amer: 69 mL/min — ABNORMAL LOW (ref >90)
Potassium: 4.1 mEq/L (ref 3.5–5.1)
Sodium: 143 mEq/L (ref 135–145)

## 2012-10-22 LAB — CBC
Hemoglobin: 15.8 g/dL (ref 13.0–17.0)
MCV: 81.1 fL (ref 78.0–100.0)
Platelets: 231 10*3/uL (ref 150–400)
RBC: 5.33 MIL/uL (ref 4.22–5.81)
WBC: 8.4 10*3/uL (ref 4.0–10.5)

## 2012-10-22 LAB — PRO B NATRIURETIC PEPTIDE: Pro B Natriuretic peptide (BNP): 97.9 pg/mL (ref 0–125)

## 2012-10-22 MED ORDER — ALBUTEROL SULFATE (5 MG/ML) 0.5% IN NEBU
5.0000 mg | INHALATION_SOLUTION | Freq: Once | RESPIRATORY_TRACT | Status: AC
  Administered 2012-10-22: 5 mg via RESPIRATORY_TRACT
  Filled 2012-10-22: qty 1

## 2012-10-22 MED ORDER — OXYMETAZOLINE HCL 0.05 % NA SOLN
1.0000 | Freq: Once | NASAL | Status: AC
  Administered 2012-10-22: 1 via NASAL
  Filled 2012-10-22: qty 15

## 2012-10-22 MED ORDER — METHYLPREDNISOLONE SODIUM SUCC 125 MG IJ SOLR
125.0000 mg | Freq: Once | INTRAMUSCULAR | Status: AC
  Administered 2012-10-22: 125 mg via INTRAVENOUS
  Filled 2012-10-22: qty 2

## 2012-10-22 MED ORDER — DIPHENHYDRAMINE HCL 50 MG/ML IJ SOLN
12.5000 mg | Freq: Once | INTRAMUSCULAR | Status: AC
  Administered 2012-10-22: 12.5 mg via INTRAVENOUS
  Filled 2012-10-22: qty 1

## 2012-10-22 MED ORDER — PREDNISONE 20 MG PO TABS: 60.0000 mg | ORAL_TABLET | Freq: Every day | ORAL | Status: DC

## 2012-10-22 MED ORDER — ALBUTEROL SULFATE HFA 108 (90 BASE) MCG/ACT IN AERS
2.0000 | INHALATION_SPRAY | RESPIRATORY_TRACT | Status: DC | PRN
  Administered 2012-10-22: 2 via RESPIRATORY_TRACT
  Filled 2012-10-22: qty 6.7

## 2012-10-22 NOTE — ED Notes (Signed)
Pt discharged.Vital signs stable and GCS 15 

## 2012-10-22 NOTE — ED Provider Notes (Signed)
History     CSN: [id number]  Arrival date & time 10/21/12  2348   First MD Initiated Contact with Patient 10/22/12 0041      Chief Complaint  Patient presents with  . Shortness of Breath    (Consider location/radiation/quality/duration/timing/severity/associated sxs/prior treatment) HPI History provided by patient. Has history of CHF with pacemaker followed by Dr. Sharyn Lull. For the last week has been having allergy symptoms with itchy eyes, congestion and has now progressed to wheezing with some difficulty breathing. No weight gain or new leg swelling. No new medications. Patient states he had same symptoms last fall and required albuterol at that time. No fevers or chills. No cough. No chest pain. Symptoms moderate in severity.  Past Medical History  Diagnosis Date  . DCM (dilated cardiomyopathy)   . LV dysfunction     with an EF in 2007 at 34%  . CHF (congestive heart failure)   . HTN (hypertension)   . Dyslipidemia     Past Surgical History  Procedure Laterality Date  . Repeat echo  2008     demonstrated near normalization  . Pacemaker insertion    . Transthoracic echocardiogram  11/09/2005, 01/15/2009, 02/07/2010    History reviewed. No pertinent family history.  History  Substance Use Topics  . Smoking status: Former Smoker    Quit date: 05/17/2009  . Smokeless tobacco: Not on file  . Alcohol Use: No     Comment: Denies      Review of Systems  Constitutional: Negative for fever and chills.  HENT: Positive for congestion. Negative for sore throat, facial swelling and ear discharge.   Eyes: Negative for pain.  Respiratory: Positive for shortness of breath and wheezing.   Cardiovascular: Negative for chest pain.  Gastrointestinal: Negative for vomiting and abdominal pain.  Genitourinary: Negative for dysuria.  Musculoskeletal: Negative for back pain.  Skin: Negative for rash.  Neurological: Negative for headaches.  All other systems reviewed and are  negative.    Allergies  Review of patient's allergies indicates no known allergies.  Home Medications   Current Outpatient Rx  Name  Route  Sig  Dispense  Refill  . albuterol (PROVENTIL HFA;VENTOLIN HFA) 108 (90 BASE) MCG/ACT inhaler   Inhalation   Inhale 1-2 puffs into the lungs every 6 (six) hours as needed for wheezing.   1 Inhaler   0   . amiodarone (PACERONE) 200 MG tablet   Oral   Take 200 mg by mouth at bedtime.          Marland Kitchen aspirin 81 MG tablet   Oral   Take 81 mg by mouth 2 (two) times daily.           . carvedilol (COREG) 12.5 MG tablet   Oral   Take 12.5 mg by mouth 2 (two) times daily with a meal.           . lisinopril (PRINIVIL,ZESTRIL) 20 MG tablet   Oral   Take 20 mg by mouth at bedtime.           BP 139/91  Pulse 73  Temp(Src) 98 F (36.7 C) (Oral)  SpO2 100%  Physical Exam  Constitutional: He is oriented to person, place, and time. He appears well-developed and well-nourished.  HENT:  Head: Normocephalic and atraumatic.  Nasal congestion  Eyes: EOM are normal. Pupils are equal, round, and reactive to light.  Mild conjunctival injection  Neck: Neck supple. No tracheal deviation present.  Cardiovascular: Regular rhythm and intact distal  pulses.   Pulmonary/Chest: Effort normal. No stridor.  Decreased breath sounds with prolonged expiration and bilateral expiratory wheezes. No tachypnea or respiratory distress  Abdominal: Soft. Bowel sounds are normal. He exhibits no distension. There is no tenderness.  Musculoskeletal: Normal range of motion. He exhibits no tenderness.  Neurological: He is alert and oriented to person, place, and time.  Skin: Skin is warm and dry.    ED Course  Procedures (including critical care time)  Results for orders placed during the hospital encounter of 10/21/12  BASIC METABOLIC PANEL      Result Value Range   Sodium 143  135 - 145 mEq/L   Potassium 4.1  3.5 - 5.1 mEq/L   Chloride 108  96 - 112 mEq/L    CO2 25  19 - 32 mEq/L   Glucose, Bld 96  70 - 99 mg/dL   BUN 14  6 - 23 mg/dL   Creatinine, Ser 1.61  0.50 - 1.35 mg/dL   Calcium 9.5  8.4 - 09.6 mg/dL   GFR calc non Af Amer 69 (*) >90 mL/min   GFR calc Af Amer 80 (*) >90 mL/min  CBC      Result Value Range   WBC 8.4  4.0 - 10.5 K/uL   RBC 5.33  4.22 - 5.81 MIL/uL   Hemoglobin 15.8  13.0 - 17.0 g/dL   HCT 04.5  40.9 - 81.1 %   MCV 81.1  78.0 - 100.0 fL   MCH 29.6  26.0 - 34.0 pg   MCHC 36.6 (*) 30.0 - 36.0 g/dL   RDW 91.4  78.2 - 95.6 %   Platelets 231  150 - 400 K/uL  PRO B NATRIURETIC PEPTIDE      Result Value Range   Pro B Natriuretic peptide (BNP) 97.9  0 - 125 pg/mL   Dg Chest 2 View (if Patient Has Fever And/or Copd)  10/22/2012  *RADIOLOGY REPORT*  Clinical Data: Shortness breath.  Wheezing.  Cardiomyopathy.  CHEST - 2 VIEW  Comparison: 06/02/2012  Findings: Single lead AICD remains in appropriate position.  Heart size is normal.  Both lungs are clear.  No evidence of pleural effusion.  No mass or lymphadenopathy identified.  IMPRESSION: Stable exam.  No active disease.   Original Report Authenticated By: Myles Rosenthal, M.D.      Date: 10/22/2012  Rate: 63  Rhythm: normal sinus rhythm  QRS Axis: normal  Intervals: normal  ST/T Wave abnormalities: nonspecific ST changes  Conduction Disutrbances:nonspecific intraventricular conduction delay  Narrative Interpretation:   Old EKG Reviewed: unchanged  Albuterol, steroids, Afrin, Benadryl for itching  3:21 AM after 2 breathing treatments lung sounds significantly improved with good air movement and wheezes resolved. Patient feeling much better and feels comfortable to be discharged home. He'll take Benadryl at nighttime as needed, take Afrin for the next 3 days and then stop, and use albuterol inhaler as needed. He agrees to followup with his primary care physician in clinic next week and return here for worsening condition.  MDM  Bronchospasm with allergy symptoms  Improved  with medications as above  Evaluated with EKG, chest x-ray and labs  Vital signs and nursing notes reviewed and considered        Sunnie Nielsen, MD 10/22/12 [phone]

## 2012-12-30 ENCOUNTER — Emergency Department (HOSPITAL_COMMUNITY): Payer: No Typology Code available for payment source

## 2012-12-30 ENCOUNTER — Emergency Department (HOSPITAL_COMMUNITY)
Admission: EM | Admit: 2012-12-30 | Discharge: 2012-12-30 | Disposition: A | Discharge status: Home / Self Care | Payer: No Typology Code available for payment source | Attending: Emergency Medicine | Admitting: Emergency Medicine

## 2012-12-30 ENCOUNTER — Encounter (HOSPITAL_COMMUNITY): Payer: Self-pay | Admitting: *Deleted

## 2012-12-30 DIAGNOSIS — I519 Heart disease, unspecified: Secondary | ICD-10-CM | POA: Insufficient documentation

## 2012-12-30 DIAGNOSIS — R0602 Shortness of breath: Secondary | ICD-10-CM | POA: Insufficient documentation

## 2012-12-30 DIAGNOSIS — Z95810 Presence of automatic (implantable) cardiac defibrillator: Secondary | ICD-10-CM | POA: Insufficient documentation

## 2012-12-30 DIAGNOSIS — E785 Hyperlipidemia, unspecified: Secondary | ICD-10-CM | POA: Insufficient documentation

## 2012-12-30 DIAGNOSIS — I10 Essential (primary) hypertension: Secondary | ICD-10-CM | POA: Insufficient documentation

## 2012-12-30 DIAGNOSIS — R062 Wheezing: Secondary | ICD-10-CM | POA: Insufficient documentation

## 2012-12-30 DIAGNOSIS — Z8679 Personal history of other diseases of the circulatory system: Secondary | ICD-10-CM | POA: Insufficient documentation

## 2012-12-30 DIAGNOSIS — Z87891 Personal history of nicotine dependence: Secondary | ICD-10-CM | POA: Insufficient documentation

## 2012-12-30 DIAGNOSIS — I509 Heart failure, unspecified: Secondary | ICD-10-CM | POA: Insufficient documentation

## 2012-12-30 DIAGNOSIS — Z7982 Long term (current) use of aspirin: Secondary | ICD-10-CM | POA: Insufficient documentation

## 2012-12-30 DIAGNOSIS — J3489 Other specified disorders of nose and nasal sinuses: Secondary | ICD-10-CM | POA: Insufficient documentation

## 2012-12-30 DIAGNOSIS — J9801 Acute bronchospasm: Secondary | ICD-10-CM | POA: Insufficient documentation

## 2012-12-30 DIAGNOSIS — I428 Other cardiomyopathies: Secondary | ICD-10-CM | POA: Insufficient documentation

## 2012-12-30 DIAGNOSIS — Z79899 Other long term (current) drug therapy: Secondary | ICD-10-CM | POA: Insufficient documentation

## 2012-12-30 HISTORY — DX: Personal history of other diseases of the circulatory system: Z86.79

## 2012-12-30 LAB — BASIC METABOLIC PANEL
BUN: 13 mg/dL (ref 6–23)
CO2: 23 mEq/L (ref 19–32)
Chloride: 105 mEq/L (ref 96–112)
Glucose, Bld: 113 mg/dL — ABNORMAL HIGH (ref 70–99)
Potassium: 4.1 mEq/L (ref 3.5–5.1)

## 2012-12-30 LAB — CBC WITH DIFFERENTIAL/PLATELET
Hemoglobin: 16.5 g/dL (ref 13.0–17.0)
Lymphs Abs: 3.8 10*3/uL (ref 0.7–4.0)
Monocytes Relative: 9 % (ref 3–12)
Neutro Abs: 3.4 10*3/uL (ref 1.7–7.7)
Neutrophils Relative %: 34 % — ABNORMAL LOW (ref 43–77)
RBC: 5.59 MIL/uL (ref 4.22–5.81)

## 2012-12-30 MED ORDER — ALBUTEROL SULFATE (5 MG/ML) 0.5% IN NEBU
10.0000 mg | INHALATION_SOLUTION | Freq: Once | RESPIRATORY_TRACT | Status: AC
  Administered 2012-12-30: 10 mg via RESPIRATORY_TRACT

## 2012-12-30 MED ORDER — AEROCHAMBER PLUS W/MASK MISC
Status: AC
  Filled 2012-12-30: qty 1

## 2012-12-30 MED ORDER — METHYLPREDNISOLONE SODIUM SUCC 125 MG IJ SOLR
125.0000 mg | Freq: Once | INTRAMUSCULAR | Status: AC
  Administered 2012-12-30: 125 mg via INTRAVENOUS
  Filled 2012-12-30: qty 2

## 2012-12-30 MED ORDER — PREDNISONE 20 MG PO TABS: 40.0000 mg | ORAL_TABLET | Freq: Every day | ORAL | Status: DC

## 2012-12-30 MED ORDER — ALBUTEROL SULFATE (5 MG/ML) 0.5% IN NEBU: 15.0000 mg | INHALATION_SOLUTION | Freq: Once | RESPIRATORY_TRACT | Status: DC

## 2012-12-30 MED ORDER — IPRATROPIUM BROMIDE 0.02 % IN SOLN
RESPIRATORY_TRACT | Status: AC
  Filled 2012-12-30: qty 5

## 2012-12-30 MED ORDER — ALBUTEROL (5 MG/ML) CONTINUOUS INHALATION SOLN
INHALATION_SOLUTION | RESPIRATORY_TRACT | Status: AC
  Filled 2012-12-30: qty 20

## 2012-12-30 MED ORDER — ALBUTEROL SULFATE HFA 108 (90 BASE) MCG/ACT IN AERS
2.0000 | INHALATION_SPRAY | RESPIRATORY_TRACT | Status: AC
  Administered 2012-12-30: 2 via RESPIRATORY_TRACT
  Filled 2012-12-30 (×2): qty 6.7

## 2012-12-30 MED ORDER — IPRATROPIUM BROMIDE 0.02 % IN SOLN
1.0000 mg | Freq: Once | RESPIRATORY_TRACT | Status: AC
  Administered 2012-12-30: 1 mg via RESPIRATORY_TRACT

## 2012-12-30 NOTE — ED Notes (Signed)
Oral suction and Mouth care preformed. Pt tol. well

## 2012-12-30 NOTE — ED Provider Notes (Signed)
History     CSN: [id number]  Arrival date & time 12/30/12  4098   First MD Initiated Contact with Patient 12/30/12 [phone]      Chief Complaint  Patient presents with  . Cough     HPI Pt was seen at 0910.   Per pt, c/o gradual onset and worsening of persistent cough, wheezing and SOB for the past 1 week, worse over the past several days.  Describes his symptoms as "like last time I was here."  Has been using home MDI without relief and has now run out.  Has been associated with runny/stuffy nose, sneezing and "itching eyes."  States he has had these symptoms previously without definitive dx.  States he has not f/u with Pulmonary MD for PFT's. Denies CP/palpitations, no back pain, no abd pain, no N/V/D, no fevers, no rash.     Past Medical History  Diagnosis Date  . DCM (dilated cardiomyopathy)   . LV dysfunction     with an EF in 2007 at 34%  . HTN (hypertension)   . Dyslipidemia   . Hx of ventricular fibrillation     Hx of VFib arrest s/p AICD placement  . CHF (congestive heart failure)     chronic    Past Surgical History  Procedure Laterality Date  . Repeat echo  2008     demonstrated near normalization  . Transthoracic echocardiogram  11/09/2005, 01/15/2009, 02/07/2010  . Cardiac defibrillator placement        History  Substance Use Topics  . Smoking status: Former Smoker    Quit date: 05/17/2009  . Smokeless tobacco: Not on file  . Alcohol Use: No     Comment: Denies      Review of Systems ROS: Statement: All systems negative except as marked or noted in the HPI; Constitutional: Negative for fever and chills. ; ; Eyes: +itching eyes. Negative for eye pain, redness and discharge. ; ; ENMT: Negative for ear pain, hoarseness, sore throat. +nasal congestion, sinus pressure and sneezing. ; ; Cardiovascular: Negative for chest pain, palpitations, diaphoresis, and peripheral edema. ; ; Respiratory: +cough, wheezing, SOB. Negative for stridor. ; ; Gastrointestinal:  Negative for nausea, vomiting, diarrhea, abdominal pain, blood in stool, hematemesis, jaundice and rectal bleeding. . ; ; Genitourinary: Negative for dysuria, flank pain and hematuria. ; ; Musculoskeletal: Negative for back pain and neck pain. Negative for swelling and trauma.; ; Skin: Negative for pruritus, rash, abrasions, blisters, bruising and skin lesion.; ; Neuro: Negative for headache, lightheadedness and neck stiffness. Negative for weakness, altered level of consciousness , altered mental status, extremity weakness, paresthesias, involuntary movement, seizure and syncope.       Allergies  Review of patient's allergies indicates no known allergies.  Home Medications   Current Outpatient Rx  Name  Route  Sig  Dispense  Refill  . albuterol (PROVENTIL HFA;VENTOLIN HFA) 108 (90 BASE) MCG/ACT inhaler   Inhalation   Inhale 1-2 puffs into the lungs every 6 (six) hours as needed for wheezing.   1 Inhaler   0   . amiodarone (PACERONE) 200 MG tablet   Oral   Take 200 mg by mouth at bedtime.          Marland Kitchen aspirin 81 MG tablet   Oral   Take 81 mg by mouth 2 (two) times daily.           . carvedilol (COREG) 12.5 MG tablet   Oral   Take 12.5 mg by mouth 2 (two)  times daily with a meal.           . diphenhydrAMINE (BENADRYL) 25 MG tablet   Oral   Take 25 mg by mouth every 6 (six) hours as needed for itching or allergies.         Marland Kitchen lisinopril (PRINIVIL,ZESTRIL) 20 MG tablet   Oral   Take 20 mg by mouth at bedtime.         Marland Kitchen OVER THE COUNTER MEDICATION   Ophthalmic   Apply 1 drop to eye 2 (two) times daily as needed. For allergies           BP 118/66  Pulse 70  Temp(Src) 99.1 F (37.3 C) (Oral)  Resp 21  SpO2 100%  Physical Exam 0915: Physical examination:  Nursing notes reviewed; Vital signs and O2 SAT reviewed;  Constitutional: Well developed, Well nourished, Well hydrated, Uncomfortable appearing; Head:  Normocephalic, atraumatic; Eyes: EOMI, PERRL, No scleral  icterus; ENMT: Mouth and pharynx normal, Mucous membranes moist; Neck: Supple, Full range of motion, No lymphadenopathy; Cardiovascular: Regular rate and rhythm, No gallop; Respiratory: Breath sounds diminished & equal bilaterally, insp/exp wheezes bilat with audible wheezing. Speaking in phrases, sitting upright. Tachypneic, +access mm use;; Chest: Nontender, Movement normal; Abdomen: Soft, Nontender, Nondistended, Normal bowel sounds; Genitourinary: No CVA tenderness; Extremities: Pulses normal, No tenderness, No edema, No calf edema or asymmetry.; Neuro: AA&Ox3, Major CN grossly intact.  Speech clear. No gross focal motor or sensory deficits in extremities.; Skin: Color normal, Warm, Diaphoretic.   ED Course  Procedures    MDM  MDM Reviewed: previous chart, nursing note and vitals Reviewed previous: labs and ECG Interpretation: labs, x-ray and ECG    Date: 12/30/2012  Rate: 65  Rhythm: normal sinus rhythm  QRS Axis: normal  Intervals: normal  ST/T Wave abnormalities: normal  Conduction Disutrbances:none  Narrative Interpretation:   Old EKG Reviewed: unchanged; no significant changes from previous EKG dated 10/22/2012.  Results for orders placed during the hospital encounter of 12/30/12  CBC WITH DIFFERENTIAL      Result Value Range   WBC 9.8  4.0 - 10.5 K/uL   RBC 5.59  4.22 - 5.81 MIL/uL   Hemoglobin 16.5  13.0 - 17.0 g/dL   HCT 95.6  21.3 - 08.6 %   MCV 82.5  78.0 - 100.0 fL   MCH 29.5  26.0 - 34.0 pg   MCHC 35.8  30.0 - 36.0 g/dL   RDW 57.8  46.9 - 62.9 %   Platelets 222  150 - 400 K/uL   Neutrophils Relative % 34 (*) 43 - 77 %   Neutro Abs 3.4  1.7 - 7.7 K/uL   Lymphocytes Relative 39  12 - 46 %   Lymphs Abs 3.8  0.7 - 4.0 K/uL   Monocytes Relative 9  3 - 12 %   Monocytes Absolute 0.9  0.1 - 1.0 K/uL   Eosinophils Relative 17 (*) 0 - 5 %   Eosinophils Absolute 1.7 (*) 0.0 - 0.7 K/uL   Basophils Relative 1  0 - 1 %   Basophils Absolute 0.1  0.0 - 0.1 K/uL  BASIC  METABOLIC PANEL      Result Value Range   Sodium 140  135 - 145 mEq/L   Potassium 4.1  3.5 - 5.1 mEq/L   Chloride 105  96 - 112 mEq/L   CO2 23  19 - 32 mEq/L   Glucose, Bld 113 (*) 70 - 99 mg/dL   BUN 13  6 - 23 mg/dL   Creatinine, Ser 9.60  0.50 - 1.35 mg/dL   Calcium 9.0  8.4 - 45.4 mg/dL   GFR calc non Af Amer 80 (*) >90 mL/min   GFR calc Af Amer >90  >90 mL/min  TROPONIN I      Result Value Range   Troponin I <0.30  <0.30 ng/mL  PRO B NATRIURETIC PEPTIDE      Result Value Range   Pro B Natriuretic peptide (BNP) 48.8  0 - 125 pg/mL   Dg Chest Port 1 View 12/30/2012   *RADIOLOGY REPORT*  Clinical Data: Cough and wheezing  PORTABLE CHEST - 1 VIEW  Comparison: 10/22/2012  Findings: There is a left chest wall ICD with lead in the right ventricle.  Normal heart size.  No pleural effusion or edema. There is no airspace consolidation identified.  IMPRESSION:  1.  No acute cardiopulmonary abnormalities.   Original Report Authenticated By: Signa Kell, M.D.    1230:  Hour long nebulizer and IV solumedrol started on pt's arrival. Neb completed. States he "feels better" and wants to go home now.  NAD, lungs CTA bilat, no wheezing, resps easy, speaking full sentences, Sats 95-97% R/A.  Pt ambulated around the ED with Sats remaining 95-96% R/A, resps easy, NAD.  Requesting MDI to go home with.  Dx and testing d/w pt and family.  Questions answered.  Verb understanding, agreeable to d/c home with outpt f/u.            Laray Anger, DO 01/02/13 1811

## 2012-12-30 NOTE — ED Notes (Signed)
Pulse oximetry while ambulating: Starting oxygen saturation was 96%; while ambulating oxygen saturation was 95%. HR ranged from 88-95 bpm.

## 2012-12-30 NOTE — ED Notes (Signed)
Oral suction and oral care performed.

## 2012-12-30 NOTE — ED Notes (Signed)
Pt states that he has had cough and congestion with wheezing for "a couple days". Pt states that it has become worse.

## 2013-01-15 ENCOUNTER — Emergency Department (HOSPITAL_COMMUNITY)
Admission: EM | Admit: 2013-01-15 | Discharge: 2013-01-15 | Discharge status: Against Medical Advice | Payer: Self-pay | Source: Home / Self Care

## 2013-01-15 NOTE — ED Notes (Signed)
Pt called x 3 in lobby

## 2013-01-18 ENCOUNTER — Encounter: Payer: Self-pay | Admitting: Internal Medicine

## 2013-01-18 ENCOUNTER — Ambulatory Visit (INDEPENDENT_AMBULATORY_CARE_PROVIDER_SITE_OTHER): Payer: Self-pay | Admitting: Internal Medicine

## 2013-01-18 VITALS — BP 118/76 | HR 82 | Temp 97.0°F | Ht 72.0 in | Wt 234.0 lb

## 2013-01-18 DIAGNOSIS — R05 Cough: Secondary | ICD-10-CM

## 2013-01-18 DIAGNOSIS — R059 Cough, unspecified: Secondary | ICD-10-CM

## 2013-01-18 DIAGNOSIS — I5022 Chronic systolic (congestive) heart failure: Secondary | ICD-10-CM

## 2013-01-18 MED ORDER — TRAMADOL HCL 50 MG PO TABS: ORAL_TABLET | ORAL | Status: DC

## 2013-01-18 MED ORDER — AZITHROMYCIN 250 MG PO TABS: ORAL_TABLET | ORAL | Status: DC

## 2013-01-18 MED ORDER — OLMESARTAN MEDOXOMIL 20 MG PO TABS: 20.0000 mg | ORAL_TABLET | Freq: Every day | ORAL | Status: DC

## 2013-01-18 MED ORDER — MOMETASONE FURO-FORMOTEROL FUM 100-5 MCG/ACT IN AERO: INHALATION_SPRAY | RESPIRATORY_TRACT | Status: DC

## 2013-01-18 MED ORDER — PREDNISONE (PAK) 10 MG PO TABS: ORAL_TABLET | ORAL | Status: DC

## 2013-01-18 NOTE — Assessment & Plan Note (Signed)
ACE inhibitors are problematic in  pts with airway complaints because  even experienced pulmonologists can't always distinguish ace effects from copd/asthma.  By themselves they don't actually cause a problem, much like oxygen can't by itself start a fire, but they certainly serve as a powerful catalyst or enhancer for any "fire"  or inflammatory process in the upper airway, be it caused by an ET  tube or more commonly reflux (especially in the obese or pts with known GERD or who are on biphoshonates).    In the era of ARB near equivalency until we have a better handle on the reversibility of the airway problem, it just makes sense to avoid ACEI  entirely in the short run and then decide later, having established a level of airway control using a reasonable limited regimen, whether to add back ace but even then being very careful to observe the pt for worsening airway control and number of meds used/ needed to control symptoms.    Try benicar 20 mg one daily x 6 weeks > if cough/wheeze resolve f/u Harw[facility], if not return here

## 2013-01-18 NOTE — Progress Notes (Signed)
  Subjective:    Patient ID: [name], male    DOB: June 10, 1971  MRN: [id number]  HPI  42 yo [place] male quit smoking in 2000 self referred to pulmonary clinic 01/18/2013 for refractory cough and sob   01/18/2013 1st pulmonary ov on acei  cc new onset cough and wheeze and sob since Oct 2013 only 50%  better p albuterol / prednisone courses  for a week or so now bad again x one week with slt discolored sputum and cough worse at hs.  No longer has alb  No obvious daytime variabilty or assoc   cp or chest tightness, subjective wheeze overt sinus or hb symptoms. No unusual exp hx or h/o childhood pna/ asthma or knowledge of premature birth.     Also denies any obvious fluctuation of symptoms with weather or environmental changes or other aggravating or alleviating factors except as outlined above   Review of Systems  Constitutional: Negative for fever, chills, activity change, appetite change and unexpected weight change.  HENT: Positive for congestion. Negative for sore throat, rhinorrhea, sneezing, trouble swallowing, dental problem, voice change and postnasal drip.   Eyes: Negative for visual disturbance.  Respiratory: Positive for cough and shortness of breath. Negative for choking.   Cardiovascular: Negative for chest pain and leg swelling.  Gastrointestinal: Negative for nausea, vomiting and abdominal pain.  Genitourinary: Negative for difficulty urinating.  Musculoskeletal: Negative for arthralgias.  Skin: Negative for rash.  Psychiatric/Behavioral: Negative for behavioral problems and confusion.       Objective:   Physical Exam  Hoarse amb bm nad at rest with classic pseudowheeze/throat clearing to point of grinding  Wt Readings from Last 3 Encounters:  01/18/13 234 lb (106.142 kg)  05/20/11 245 lb (111.131 kg)  05/26/10 243 lb (110.224 kg)     HEENT: nl dentition, turbinates, and orophanx. Nl external ear canals without cough reflex   NECK :  without JVD/Nodes/TM/  nl carotid upstrokes bilaterally   LUNGS: no acc muscle use, clear to A and P bilaterally without cough on insp or exp maneuvers   CV:  RRR  no s3 or murmur or increase in P2, no edema   ABD:  soft and nontender with nl excursion in the supine position. No bruits or organomegaly, bowel sounds nl  MS:  warm without deformities, calf tenderness, cyanosis or clubbing  SKIN: warm and dry without lesions    NEURO:  alert, approp, no deficits    12/30/12 cxr reviewed . No acute cardiopulmonary abnormalities     Assessment & Plan:

## 2013-01-18 NOTE — Assessment & Plan Note (Signed)
The most common causes of chronic cough in immunocompetent adults include the following: upper airway cough syndrome (UACS), previously referred to as postnasal drip syndrome (PNDS), which is caused by variety of rhinosinus conditions; (2) asthma; (3) GERD; (4) chronic bronchitis from cigarette smoking or other inhaled environmental irritants; (5) nonasthmatic eosinophilic bronchitis; and (6) bronchiectasis.   These conditions, singly or in combination, have accounted for up to 94% of the causes of chronic cough in prospective studies.   Other conditions have constituted no >6% of the causes in prospective studies These have included bronchogenic carcinoma, chronic interstitial pneumonia, sarcoidosis, left ventricular failure, ACEI-induced cough, and aspiration from a condition associated with pharyngeal dysfunction.    Chronic cough is often simultaneously caused by more than one condition. A single cause has been found from 38 to 82% of the time, multiple causes from 18 to 62%. Multiply caused cough has been the result of three diseases up to 42% of the time.      This is almost certainly   Classic Upper airway cough syndrome, so named because it's frequently impossible to sort out how much is  CR/sinusitis with freq throat clearing (which can be related to primary GERD)   vs  causing  secondary (" extra esophageal")  GERD from wide swings in gastric pressure that occur with throat clearing, often  promoting self use of mint and menthol lozenges that reduce the lower esophageal sphincter tone and exacerbate the problem further in a cyclical fashion.   These are the same pts (now being labeled as having "irritable larynx syndrome" by some cough centers) who not infrequently have a history of having failed to tolerate ace inhibitors,  dry powder inhalers or biphosphonates or report having atypical reflux symptoms that don't respond to standard doses of PPI , and are easily confused as having aecopd or  asthma flares by even experienced allergists/ pulmonologists.   Needs tril off acei then regroup if not better

## 2013-01-18 NOTE — Patient Instructions (Addendum)
Stop lisinopril  Start benicar 20 mg one daily x 6 weeks - if better see Harwani, if worse see me  Zpak and prednisone were called in  For short of breath ok to use dulera 100 2 puffs every 12 hours but should not need after a month  Take delsym two tsp every 12 hours and supplement if needed with  tramadol 50 mg up to 2 every 4 hours to suppress the urge to cough. Swallowing water or using ice chips/non mint and menthol containing candies (such as lifesavers or sugarless jolly ranchers) are also effective.  You should rest your voice and avoid activities that you know make you cough.  Once you have eliminated the cough for 3 straight days try reducing the tramadol first,  then the delsym as tolerated.    Pulmonary follow up is as needed

## 2013-01-29 ENCOUNTER — Ambulatory Visit: Payer: Medicaid Other | Admitting: Physician Assistant

## 2013-04-02 ENCOUNTER — Emergency Department (HOSPITAL_COMMUNITY)
Admission: EM | Admit: 2013-04-02 | Discharge: 2013-04-02 | Disposition: A | Discharge status: Home / Self Care | Payer: No Typology Code available for payment source | Attending: Emergency Medicine | Admitting: Emergency Medicine

## 2013-04-02 ENCOUNTER — Encounter (HOSPITAL_COMMUNITY): Payer: Self-pay | Admitting: Emergency Medicine

## 2013-04-02 ENCOUNTER — Emergency Department (HOSPITAL_COMMUNITY): Payer: No Typology Code available for payment source

## 2013-04-02 DIAGNOSIS — R059 Cough, unspecified: Secondary | ICD-10-CM | POA: Insufficient documentation

## 2013-04-02 DIAGNOSIS — Z862 Personal history of diseases of the blood and blood-forming organs and certain disorders involving the immune mechanism: Secondary | ICD-10-CM | POA: Insufficient documentation

## 2013-04-02 DIAGNOSIS — Z7982 Long term (current) use of aspirin: Secondary | ICD-10-CM | POA: Insufficient documentation

## 2013-04-02 DIAGNOSIS — I509 Heart failure, unspecified: Secondary | ICD-10-CM | POA: Insufficient documentation

## 2013-04-02 DIAGNOSIS — Z87891 Personal history of nicotine dependence: Secondary | ICD-10-CM | POA: Insufficient documentation

## 2013-04-02 DIAGNOSIS — I10 Essential (primary) hypertension: Secondary | ICD-10-CM | POA: Insufficient documentation

## 2013-04-02 DIAGNOSIS — R05 Cough: Secondary | ICD-10-CM | POA: Insufficient documentation

## 2013-04-02 DIAGNOSIS — Z79899 Other long term (current) drug therapy: Secondary | ICD-10-CM | POA: Insufficient documentation

## 2013-04-02 DIAGNOSIS — Z8639 Personal history of other endocrine, nutritional and metabolic disease: Secondary | ICD-10-CM | POA: Insufficient documentation

## 2013-04-02 DIAGNOSIS — R062 Wheezing: Secondary | ICD-10-CM | POA: Insufficient documentation

## 2013-04-02 LAB — CBC
MCV: 82.6 fL (ref 78.0–100.0)
Platelets: 185 10*3/uL (ref 150–400)
RBC: 5.64 MIL/uL (ref 4.22–5.81)
WBC: 9.4 10*3/uL (ref 4.0–10.5)

## 2013-04-02 LAB — POCT I-STAT TROPONIN I: Troponin i, poc: 0.01 ng/mL (ref 0.00–0.08)

## 2013-04-02 LAB — BASIC METABOLIC PANEL
CO2: 25 mEq/L (ref 19–32)
Chloride: 105 mEq/L (ref 96–112)
Potassium: 4 mEq/L (ref 3.5–5.1)
Sodium: 139 mEq/L (ref 135–145)

## 2013-04-02 LAB — PRO B NATRIURETIC PEPTIDE: Pro B Natriuretic peptide (BNP): 30.3 pg/mL (ref 0–125)

## 2013-04-02 MED ORDER — PREDNISONE 20 MG PO TABS: 40.0000 mg | ORAL_TABLET | Freq: Every day | ORAL | Status: DC

## 2013-04-02 MED ORDER — PREDNISONE 20 MG PO TABS
60.0000 mg | ORAL_TABLET | Freq: Once | ORAL | Status: AC
  Administered 2013-04-02: 60 mg via ORAL
  Filled 2013-04-02: qty 3

## 2013-04-02 MED ORDER — MOMETASONE FURO-FORMOTEROL FUM 100-5 MCG/ACT IN AERO: INHALATION_SPRAY | RESPIRATORY_TRACT | Status: DC

## 2013-04-02 MED ORDER — ALBUTEROL SULFATE HFA 108 (90 BASE) MCG/ACT IN AERS: 2.0000 | INHALATION_SPRAY | Freq: Four times a day (QID) | RESPIRATORY_TRACT | Status: DC | PRN

## 2013-04-02 MED ORDER — IPRATROPIUM BROMIDE 0.02 % IN SOLN
0.5000 mg | Freq: Once | RESPIRATORY_TRACT | Status: AC
  Administered 2013-04-02: 0.5 mg via RESPIRATORY_TRACT
  Filled 2013-04-02: qty 2.5

## 2013-04-02 MED ORDER — ALBUTEROL SULFATE (5 MG/ML) 0.5% IN NEBU
5.0000 mg | INHALATION_SOLUTION | Freq: Once | RESPIRATORY_TRACT | Status: AC
  Administered 2013-04-02: 5 mg via RESPIRATORY_TRACT
  Filled 2013-04-02: qty 1

## 2013-04-02 NOTE — ED Notes (Addendum)
Pt reports three days of shortness of breath. Pt has tried inhaler at home without relief. Pt reports productive cough with green secretions. Pt denies fever. Pt took proair this morning 0650 today. Pt is out of his Dulera.

## 2013-04-02 NOTE — ED Provider Notes (Signed)
CSN: [id number]     Arrival date & time 04/02/13  0704 History   First MD Initiated Contact with Patient 04/02/13 [phone]     Chief Complaint  Patient presents with  . Shortness of Breath  . Cough   (Consider location/radiation/quality/duration/timing/severity/associated sxs/prior Treatment) HPI Comments: Patient presents emergency department with chief complaint of shortness of breath and wheezing. He states that he had these symptoms before, but states that it recently worsened over the past 3 days. He is tried taking an inhaler with no relief. He has never been diagnosed with COPD or asthma. He is a former smoker. He denies fevers or chills. He does endorse productive cough. He denies chest pain, or exertional symptoms. Denies any diaphoresis.   The history is provided by the patient. No language interpreter was used.    Past Medical History  Diagnosis Date  . DCM (dilated cardiomyopathy)   . LV dysfunction     with an EF in 2007 at 34%  . HTN (hypertension)   . Dyslipidemia   . Hx of ventricular fibrillation     Hx of VFib arrest s/p AICD placement  . CHF (congestive heart failure)     chronic   Past Surgical History  Procedure Laterality Date  . Repeat echo  2008     demonstrated near normalization  . Transthoracic echocardiogram  11/09/2005, 01/15/2009, 02/07/2010  . Cardiac defibrillator placement     Family History  Problem Relation Age of Onset  . Asthma Maternal Aunt    History  Substance Use Topics  . Smoking status: Former Smoker -- 0.25 packs/day for 2 years    Types: Cigarettes    Quit date: 07/12/1998  . Smokeless tobacco: Never Used  . Alcohol Use: No     Comment: Denies    Review of Systems  All other systems reviewed and are negative.    Allergies  Review of patient's allergies indicates no known allergies.  Home Medications   Current Outpatient Rx  Name  Route  Sig  Dispense  Refill  . albuterol (PROVENTIL HFA;VENTOLIN HFA) 108 (90 BASE)  MCG/ACT inhaler   Inhalation   Inhale 2 puffs into the lungs every 6 (six) hours as needed for wheezing.         Marland Kitchen amiodarone (PACERONE) 200 MG tablet   Oral   Take 100 mg by mouth at bedtime.          Marland Kitchen aspirin 81 MG tablet   Oral   Take 81 mg by mouth 2 (two) times daily.           . carvedilol (COREG) 12.5 MG tablet   Oral   Take 12.5 mg by mouth 2 (two) times daily with a meal.           . diphenhydrAMINE (BENADRYL) 25 MG tablet   Oral   Take 25 mg by mouth every 6 (six) hours as needed for itching or allergies.         . mometasone-formoterol (DULERA) 100-5 MCG/ACT AERO      2 puffs every 12 hours if needed for breathing      0   . olmesartan (BENICAR) 20 MG tablet   Oral   Take 1 tablet (20 mg total) by mouth daily.         . traMADol (ULTRAM) 50 MG tablet      1-2 every 4 hours as needed for cough or pain   40 tablet   0  BP 115/76  Temp(Src) 98.5 F (36.9 C) (Oral)  Resp 22  Ht 6\' 2"  (1.88 m)  Wt 240 lb (108.863 kg)  BMI 30.8 kg/m2  SpO2 97% Physical Exam  Nursing note and vitals reviewed. Constitutional: He is oriented to person, place, and time. He appears well-developed and well-nourished.  HENT:  Head: Normocephalic and atraumatic.  Nose: Nose normal.  Mouth/Throat: Oropharynx is clear and moist. No oropharyngeal exudate.  Appears a little dry  Eyes: Conjunctivae and EOM are normal. Pupils are equal, round, and reactive to light. Right eye exhibits no discharge. Left eye exhibits no discharge. No scleral icterus.  Neck: Normal range of motion. Neck supple. No JVD present.  Cardiovascular: Normal rate, regular rhythm, normal heart sounds and intact distal pulses.  Exam reveals no gallop and no friction rub.   No murmur heard. Pulmonary/Chest: Effort normal. No respiratory distress. He has wheezes. He has no rales. He exhibits no tenderness.  Bilateral wheezing  Abdominal: Soft. Bowel sounds are normal. He exhibits no distension and  no mass. There is no tenderness. There is no rebound and no guarding.  Musculoskeletal: Normal range of motion. He exhibits no edema and no tenderness.  Neurological: He is alert and oriented to person, place, and time.  Skin: Skin is warm and dry.  Psychiatric: He has a normal mood and affect. His behavior is normal. Judgment and thought content normal.    ED Course  Procedures (including critical care time) Labs Review Labs Reviewed  CBC  BASIC METABOLIC PANEL  PRO B NATRIURETIC PEPTIDE  POCT I-STAT TROPONIN I   Results for orders placed during the hospital encounter of 04/02/13  CBC      Result Value Range   WBC 9.4  4.0 - 10.5 K/uL   RBC 5.64  4.22 - 5.81 MIL/uL   Hemoglobin 17.1 (*) 13.0 - 17.0 g/dL   HCT 62.1  30.8 - 65.7 %   MCV 82.6  78.0 - 100.0 fL   MCH 30.3  26.0 - 34.0 pg   MCHC 36.7 (*) 30.0 - 36.0 g/dL   RDW 84.6  96.2 - 95.2 %   Platelets 185  150 - 400 K/uL  BASIC METABOLIC PANEL      Result Value Range   Sodium 139  135 - 145 mEq/L   Potassium 4.0  3.5 - 5.1 mEq/L   Chloride 105  96 - 112 mEq/L   CO2 25  19 - 32 mEq/L   Glucose, Bld 93  70 - 99 mg/dL   BUN 11  6 - 23 mg/dL   Creatinine, Ser 8.41  0.50 - 1.35 mg/dL   Calcium 9.1  8.4 - 32.4 mg/dL   GFR calc non Af Amer 82 (*) >90 mL/min   GFR calc Af Amer >90  >90 mL/min  PRO B NATRIURETIC PEPTIDE      Result Value Range   Pro B Natriuretic peptide (BNP) 30.3  0 - 125 pg/mL  POCT I-STAT TROPONIN I      Result Value Range   Troponin i, poc 0.01  0.00 - 0.08 ng/mL   Comment 3            Dg Chest 2 View (if Patient Has Fever And/or Copd)  04/02/2013   CLINICAL DATA:  42 year old male with shortness of breath and cough.  EXAM: CHEST  2 VIEW  COMPARISON:  12/30/2012 and earlier.  FINDINGS: Stable left chest cardiac AICD. Stable lung volumes. Stable cardiac size and mediastinal contours.  No pneumothorax or effusion. No consolidation or acute pulmonary opacity. Stable left greater than right infrahilar  markings. Stable visualized osseous structures.  IMPRESSION: No acute cardiopulmonary abnormality.   Electronically Signed   By: Augusto Gamble M.D.   On: 04/02/2013 07:52     Imaging Review Dg Chest 2 View (if Patient Has Fever And/or Copd)  04/02/2013   CLINICAL DATA:  42 year old male with shortness of breath and cough.  EXAM: CHEST  2 VIEW  COMPARISON:  12/30/2012 and earlier.  FINDINGS: Stable left chest cardiac AICD. Stable lung volumes. Stable cardiac size and mediastinal contours. No pneumothorax or effusion. No consolidation or acute pulmonary opacity. Stable left greater than right infrahilar markings. Stable visualized osseous structures.  IMPRESSION: No acute cardiopulmonary abnormality.   Electronically Signed   By: Augusto Gamble M.D.   On: 04/02/2013 07:52    MDM   1. Wheezing     On recheck, after breathing treatment, no wheezing is present. Patient states that he feels improved.  Patient ambulated in ED with O2 saturations maintained >90, no current signs of respiratory distress. Lung exam improved after nebulizer treatment. Prednisone given in the ED and pt will bd dc with 5 day burst. Pt states they are breathing at baseline. Pt has been instructed to continue using prescribed medications and to speak with PCP about today's exacerbation.   Patient discussed with Dr. Loretha Stapler, who agrees with the plan.    Roxy Horseman, PA-C 04/02/13 [phone]

## 2013-04-04 NOTE — ED Provider Notes (Signed)
Medical screening examination/treatment/procedure(s) were performed by non-physician practitioner and as supervising physician I was immediately available for consultation/collaboration.    Candyce Churn, MD 04/04/13 [phone]

## 2013-05-12 ENCOUNTER — Emergency Department (HOSPITAL_COMMUNITY)
Admission: EM | Admit: 2013-05-12 | Discharge: 2013-05-12 | Disposition: A | Discharge status: Home / Self Care | Payer: No Typology Code available for payment source | Source: Home / Self Care | Attending: Emergency Medicine | Admitting: Emergency Medicine

## 2013-05-12 ENCOUNTER — Encounter (HOSPITAL_COMMUNITY): Payer: Self-pay | Admitting: Emergency Medicine

## 2013-05-12 DIAGNOSIS — K612 Anorectal abscess: Secondary | ICD-10-CM

## 2013-05-12 DIAGNOSIS — K610 Anal abscess: Secondary | ICD-10-CM

## 2013-05-12 DIAGNOSIS — K645 Perianal venous thrombosis: Secondary | ICD-10-CM

## 2013-05-12 MED ORDER — AMOXICILLIN-POT CLAVULANATE 875-125 MG PO TABS: 1.0000 | ORAL_TABLET | Freq: Two times a day (BID) | ORAL | Status: DC

## 2013-05-12 MED ORDER — DOCUSATE SODIUM 100 MG PO CAPS: 100.0000 mg | ORAL_CAPSULE | Freq: Two times a day (BID) | ORAL | Status: DC

## 2013-05-12 MED ORDER — OXYCODONE-ACETAMINOPHEN 5-325 MG PO TABS: ORAL_TABLET | ORAL | Status: DC

## 2013-05-12 NOTE — ED Notes (Signed)
Pt has  Had  hemorroids    In  Past       He  Reports  A  Flair  Up  Of  The   Symptoms   Several  Days  Ago        He  Reports  He  Has  Been  Using  Preparation  h   Without  releif  Of the  Symptoms    He  denys  Any  Bleeding  At this  Time

## 2013-05-12 NOTE — ED Provider Notes (Signed)
Chief Complaint:   Chief Complaint  Patient presents with  . Hemorrhoids    History of Present Illness:    [name] is a 42 year old male who has had a four-day history of rectal pain and external swelling. He's had no bleeding or itching. No constipation, diarrhea, or abdominal pain. He denies any fever chills, yet he is running a fever tonight. He's had some pain in his right hip. No prior history of hemorrhoids or rectal bleeding.  Review of Systems:  Other than noted above, the patient denies any of the following symptoms: Constitutional:  No fever, chills, fatigue, weight loss or anorexia. Lungs:  No cough or shortness of breath. Heart:  No chest pain, palpitations, syncope or edema.  No cardiac history. Abdomen:  No nausea, vomiting, hematememesis, melena, constipation, or diarrhea. GU:  No dysuria, frequency, urgency, or hematuria.   Skin:  No rash or itching.  PMFSH:  Past medical history, family history, social history, meds, and allergies were reviewed along with nurse's notes.  No prior abdominal surgeries or history of GI problems.  No use of NSAIDs or aspirin.  No excessive  alcohol intake.  He has high blood pressure and takes carvedilol, amiodarone, and Benicar. He also has a history of dilated cardiomyopathy, dyslipidemia, history of ventricular fibrillation, and congestive heart failure. He is a former smoker.  Physical Exam:   Vital signs:  BP 144/84  Pulse 70  Temp(Src) 100.1 F (37.8 C) (Oral)  Resp 19  SpO2 96% Gen:  Alert, oriented, in no distress. Lungs:  Breath sounds clear and equal bilaterally.  No wheezes, rales or rhonchi. Heart:  Regular rhythm.  No gallops or murmers.   Abdomen:  Soft, nontender, no organomegaly or mass. Rectal exam: There is a 1 cm, tender mass posterior to the anus. This had the appearance of a thrombosed external hemorrhoid. Skin:  Clear, warm and dry.  No rash.  Procedure Note:  Verbal informed consent was obtained from the  patient.  Risks and benefits were outlined with the patient.  Patient understands and accepts these risks.  Identity of the patient was confirmed verbally and by armband.    Procedure was performed as followed:  The area was prepped with Betadine and anesthetized with 1.8 mL of bupivacaine. Thereafter an incision was made into the lesion yielding a small amount of pus. This was expressed. There was also a clot which was removed as well. Some 4 x 4's were used as a pressure dressing, the patient may remove these in a few hours. Should start doing hot sitz baths tomorrow, twice a day.  Patient tolerated the procedure well without any immediate complications.  Assessment:  The primary encounter diagnosis was Perianal abscess. A diagnosis of Thrombosed external hemorrhoid was also pertinent to this visit.  He appears to have both a thrombosed external hemorrhoid and a small abscess. This is probably the cause of his fever. This should clear up now it has been opened up and drained. Return again as needed.  Plan:   1.  Meds:  The following meds were prescribed:   New Prescriptions   AMOXICILLIN-CLAVULANATE (AUGMENTIN) 875-125 MG PER TABLET    Take 1 tablet by mouth 2 (two) times daily.   DOCUSATE SODIUM (COLACE) 100 MG CAPSULE    Take 1 capsule (100 mg total) by mouth every 12 (twelve) hours.   OXYCODONE-ACETAMINOPHEN (PERCOCET) 5-325 MG PER TABLET    1 to 2 tablets every 6 hours as needed for pain.  2.  Patient Education/Counseling:  The patient was given appropriate handouts, self care instructions, and instructed in symptomatic relief.  Start sitz baths and stool softeners tomorrow.  3.  Follow up:  The patient was told to follow up if no better in 3 to 4 days, if becoming worse in any way, and given some red flag symptoms such as increasing pain or fever which would prompt immediate return.  Follow up here as necessary.      Reuben Likes, MD 05/12/13 [phone]

## 2013-05-15 LAB — CULTURE, ROUTINE-ABSCESS: Gram Stain: NONE SEEN

## 2013-05-17 ENCOUNTER — Other Ambulatory Visit: Payer: Self-pay

## 2013-08-22 ENCOUNTER — Encounter (HOSPITAL_COMMUNITY): Payer: Self-pay | Admitting: Emergency Medicine

## 2013-08-22 ENCOUNTER — Emergency Department (HOSPITAL_COMMUNITY)
Admission: EM | Admit: 2013-08-22 | Discharge: 2013-08-22 | Disposition: A | Discharge status: Home / Self Care | Payer: No Typology Code available for payment source | Source: Home / Self Care | Attending: Family Medicine | Admitting: Family Medicine

## 2013-08-22 DIAGNOSIS — R319 Hematuria, unspecified: Secondary | ICD-10-CM

## 2013-08-22 DIAGNOSIS — J069 Acute upper respiratory infection, unspecified: Secondary | ICD-10-CM

## 2013-08-22 LAB — POCT URINALYSIS DIP (DEVICE)
Bilirubin Urine: NEGATIVE
Glucose, UA: NEGATIVE mg/dL
Ketones, ur: NEGATIVE mg/dL
LEUKOCYTES UA: NEGATIVE
Nitrite: NEGATIVE
PROTEIN: 100 mg/dL — AB
UROBILINOGEN UA: 0.2 mg/dL (ref 0.0–1.0)
pH: 5.5 (ref 5.0–8.0)

## 2013-08-22 MED ORDER — BENZONATATE 100 MG PO CAPS: 100.0000 mg | ORAL_CAPSULE | Freq: Three times a day (TID) | ORAL | Status: DC | PRN

## 2013-08-22 MED ORDER — IPRATROPIUM BROMIDE 0.06 % NA SOLN: 2.0000 | Freq: Four times a day (QID) | NASAL | Status: DC

## 2013-08-22 NOTE — Discharge Instructions (Signed)
1. Common Cold:Your exam is consistent with common cold. Will provide Rx for Tessalon Perles for cough and Atrovent nasal spray for rhinorrhea and nasal congestion.  2. UA suggestive of dehydration and trace amount of blood in urine without evidence of infection. Even if small kidney stone is being passed, given that you are still able to pass urine comfortably, the  Passage of a small stone is typically a self limited process. Will advise straining urine at home to observe. Caution that if you have increasing flank pain, persistent vomiting, fever, bloody urine or inability to pass urine, you should seek re-evaluation at nearest ER.

## 2013-08-22 NOTE — ED Notes (Signed)
2 problems. C/o URI exposure at work, w cough, green.yellow secretions when he coughs. Also stated history of kidney stones w hematuria. NAD, c/o pain in right flank area

## 2013-08-22 NOTE — ED Provider Notes (Signed)
CSN: [id number]     Arrival date & time 08/22/13  1012 History   First MD Initiated Contact with Patient 08/22/13 1058     Chief Complaint  Patient presents with  . URI     (Consider location/radiation/quality/duration/timing/severity/associated sxs/prior Treatment) HPI Comments: Patient reports 1-2 day history of cough, rhinorrhea, nasal congestion without associated fever or sore throat. States multiple coworkers ill with URI sx. Denies N/V/D, chest pain or dyspnea.  Also wishes to mention that he has a history of nephrolithiasis and ureterolithiasis and when he passes a kidney stone he states his urine "feels hot." Wishes to have his urine checked today because he states he has had 3 days of "hot urine" without associated abdominal pain, flank pain, fever, dysuria, hematuria or penile discharge. Denies difficulty with passing urine.   The history is provided by the patient.    Past Medical History  Diagnosis Date  . DCM (dilated cardiomyopathy)   . LV dysfunction     with an EF in 2007 at 34%  . HTN (hypertension)   . Dyslipidemia   . Hx of ventricular fibrillation     Hx of VFib arrest s/p AICD placement  . CHF (congestive heart failure)     chronic   Past Surgical History  Procedure Laterality Date  . Repeat echo  2008     demonstrated near normalization  . Transthoracic echocardiogram  11/09/2005, 01/15/2009, 02/07/2010  . Cardiac defibrillator placement     Family History  Problem Relation Age of Onset  . Asthma Maternal Aunt    History  Substance Use Topics  . Smoking status: Former Smoker -- 0.25 packs/day for 2 years    Types: Cigarettes    Quit date: 07/12/1998  . Smokeless tobacco: Never Used  . Alcohol Use: No     Comment: Denies    Review of Systems  Constitutional: Negative.   HENT: Positive for congestion, postnasal drip and rhinorrhea. Negative for ear pain, mouth sores, sneezing and sore throat.   Eyes: Negative.   Respiratory: Positive for cough.  Negative for chest tightness and shortness of breath.   Cardiovascular: Negative.   Gastrointestinal: Negative.   Endocrine: Negative for polydipsia, polyphagia and polyuria.  Genitourinary: Negative.        See HPI  Musculoskeletal: Negative.   Skin: Negative.   Allergic/Immunologic: Negative for immunocompromised state.  Neurological: Negative.   Hematological: Negative.   Psychiatric/Behavioral: Negative.       Allergies  Review of patient's allergies indicates no known allergies.  Home Medications   Current Outpatient Rx  Name  Route  Sig  Dispense  Refill  . amiodarone (PACERONE) 200 MG tablet   Oral   Take 100 mg by mouth at bedtime.          . carvedilol (COREG) 12.5 MG tablet   Oral   Take 12.5 mg by mouth 2 (two) times daily with a meal.           . telmisartan (MICARDIS) 40 MG tablet   Oral   Take 40 mg by mouth daily.         Marland Kitchen albuterol (PROVENTIL HFA;VENTOLIN HFA) 108 (90 BASE) MCG/ACT inhaler   Inhalation   Inhale 2 puffs into the lungs every 6 (six) hours as needed for wheezing.   1 Inhaler   0   . amoxicillin-clavulanate (AUGMENTIN) 875-125 MG per tablet   Oral   Take 1 tablet by mouth 2 (two) times daily.   20 tablet  0   . aspirin 81 MG tablet   Oral   Take 81 mg by mouth 2 (two) times daily.           . diphenhydrAMINE (BENADRYL) 25 MG tablet   Oral   Take 25 mg by mouth every 6 (six) hours as needed for itching or allergies.         Marland Kitchen docusate sodium (COLACE) 100 MG capsule   Oral   Take 1 capsule (100 mg total) by mouth every 12 (twelve) hours.   60 capsule   0   . mometasone-formoterol (DULERA) 100-5 MCG/ACT AERO      2 puffs every 12 hours if needed for breathing   1 Inhaler   0   . olmesartan (BENICAR) 20 MG tablet   Oral   Take 1 tablet (20 mg total) by mouth daily.         Marland Kitchen oxyCODONE-acetaminophen (PERCOCET) 5-325 MG per tablet      1 to 2 tablets every 6 hours as needed for pain.   20 tablet   0   .  predniSONE (DELTASONE) 20 MG tablet   Oral   Take 2 tablets (40 mg total) by mouth daily.   10 tablet   0   . traMADol (ULTRAM) 50 MG tablet      1-2 every 4 hours as needed for cough or pain   40 tablet   0    BP 129/72  Pulse 76  Temp(Src) 99.2 F (37.3 C) (Oral)  Resp 20  SpO2 100% Physical Exam  Nursing note and vitals reviewed. Constitutional: He is oriented to person, place, and time. He appears well-developed and well-nourished. No distress.  HENT:  Head: Normocephalic and atraumatic.  Right Ear: Hearing, tympanic membrane, external ear and ear canal normal.  Left Ear: Hearing, tympanic membrane, external ear and ear canal normal.  Nose: Rhinorrhea present.  Mouth/Throat: Uvula is midline, oropharynx is clear and moist and mucous membranes are normal.  Eyes: Conjunctivae are normal. Right eye exhibits no discharge. Left eye exhibits no discharge.  Neck: Normal range of motion. Neck supple.  Cardiovascular: Normal rate, regular rhythm and normal heart sounds.   Pulmonary/Chest: Effort normal and breath sounds normal.  Abdominal: Soft. Bowel sounds are normal. He exhibits no distension. There is no tenderness. There is no CVA tenderness.  Musculoskeletal: Normal range of motion.  Lymphadenopathy:    He has no cervical adenopathy.  Neurological: He is alert and oriented to person, place, and time.  Skin: Skin is warm and dry.  Psychiatric: He has a normal mood and affect. His behavior is normal.    ED Course  Procedures (including critical care time) Labs Review Labs Reviewed  POCT URINALYSIS DIP (DEVICE) - Abnormal; Notable for the following:    Hgb urine dipstick TRACE (*)    Protein, ur 100 (*)    All other components within normal limits   Imaging Review No results found.    MDM   Final diagnoses:  None   1. URI: exam consistent with mild, likely viral URI. Will provide Rx for Tessalon Perles for cough and Atrovent nasal spray for rhinorrhea and  nasal congestion.  2. UA suggestive of dehydration and trace proteinuria and hematuria without evidence of infection. Clinical exam does not suggest renal colic or pyelonephritis as patient appears quite comfortable in exam room and is without fever or CVAT. Even if small renal stone is being passed, patient is still able to pass urine  comfortably and passage is typically self limited process. Will advise straining urine at home to observe. Cautioned patient that if increasing flank pain, persistent vomiting, fever, gross hematuria or inability to pass urine, he should seek re-evaluation at nearest ER.    [place], [place] 08/22/13 1154

## 2013-08-23 NOTE — ED Provider Notes (Signed)
Medical screening examination/treatment/procedure(s) were performed by a resident physician or non-physician practitioner and as the supervising physician I was immediately available for consultation/collaboration.  Lynne Leader, MD    Gregor Hams, MD 08/23/13 [phone]

## 2013-09-14 ENCOUNTER — Ambulatory Visit: Payer: PRIVATE HEALTH INSURANCE | Admitting: Internal Medicine

## 2013-09-19 ENCOUNTER — Ambulatory Visit: Payer: PRIVATE HEALTH INSURANCE | Admitting: Internal Medicine

## 2013-09-26 ENCOUNTER — Ambulatory Visit: Payer: No Typology Code available for payment source | Attending: Internal Medicine | Admitting: Cardiology

## 2013-09-27 ENCOUNTER — Ambulatory Visit: Payer: No Typology Code available for payment source | Attending: Internal Medicine

## 2013-09-27 ENCOUNTER — Other Ambulatory Visit: Payer: No Typology Code available for payment source

## 2013-09-27 DIAGNOSIS — Z0000 Encounter for general adult medical examination without abnormal findings: Secondary | ICD-10-CM

## 2013-09-27 LAB — LIPID PANEL
Cholesterol: 177 mg/dL (ref 0–200)
HDL: 24 mg/dL — ABNORMAL LOW (ref >39)
LDL Cholesterol: 103 mg/dL — ABNORMAL HIGH (ref 0–99)
TRIGLYCERIDES: 248 mg/dL — AB (ref <150)
Total CHOL/HDL Ratio: 7.4 Ratio
VLDL: 50 mg/dL — ABNORMAL HIGH (ref 0–40)

## 2013-09-27 LAB — HEMOGLOBIN A1C
Hgb A1c MFr Bld: 5.8 % — ABNORMAL HIGH (ref <5.7)
MEAN PLASMA GLUCOSE: 120 mg/dL — AB (ref <117)

## 2013-10-11 ENCOUNTER — Ambulatory Visit: Payer: No Typology Code available for payment source | Attending: Internal Medicine | Admitting: Internal Medicine

## 2013-10-11 ENCOUNTER — Encounter: Payer: Self-pay | Admitting: Internal Medicine

## 2013-10-11 VITALS — BP 120/87 | HR 62 | Temp 98.2°F | Resp 17 | Wt 245.0 lb

## 2013-10-11 DIAGNOSIS — Z95810 Presence of automatic (implantable) cardiac defibrillator: Secondary | ICD-10-CM

## 2013-10-11 DIAGNOSIS — E785 Hyperlipidemia, unspecified: Secondary | ICD-10-CM

## 2013-10-11 DIAGNOSIS — R062 Wheezing: Secondary | ICD-10-CM

## 2013-10-11 DIAGNOSIS — R7301 Impaired fasting glucose: Secondary | ICD-10-CM | POA: Insufficient documentation

## 2013-10-11 DIAGNOSIS — J309 Allergic rhinitis, unspecified: Secondary | ICD-10-CM | POA: Insufficient documentation

## 2013-10-11 DIAGNOSIS — Z139 Encounter for screening, unspecified: Secondary | ICD-10-CM

## 2013-10-11 DIAGNOSIS — I5022 Chronic systolic (congestive) heart failure: Secondary | ICD-10-CM

## 2013-10-11 DIAGNOSIS — Z950 Presence of cardiac pacemaker: Secondary | ICD-10-CM | POA: Insufficient documentation

## 2013-10-11 DIAGNOSIS — Z9109 Other allergy status, other than to drugs and biological substances: Secondary | ICD-10-CM | POA: Insufficient documentation

## 2013-10-11 MED ORDER — SIMVASTATIN 10 MG PO TABS: 10.0000 mg | ORAL_TABLET | Freq: Every day | ORAL | Status: DC

## 2013-10-11 MED ORDER — TELMISARTAN 40 MG PO TABS: 40.0000 mg | ORAL_TABLET | Freq: Every day | ORAL | Status: DC

## 2013-10-11 MED ORDER — ALBUTEROL SULFATE HFA 108 (90 BASE) MCG/ACT IN AERS: 2.0000 | INHALATION_SPRAY | Freq: Four times a day (QID) | RESPIRATORY_TRACT | Status: DC | PRN

## 2013-10-11 MED ORDER — CARVEDILOL 12.5 MG PO TABS: 12.5000 mg | ORAL_TABLET | Freq: Two times a day (BID) | ORAL | Status: DC

## 2013-10-11 NOTE — Progress Notes (Signed)
Patient here to establish care Takes medication for HTN Using an inhaler for asthma like symptoms

## 2013-10-11 NOTE — Patient Instructions (Addendum)
Diabetes Meal Planning Guide The diabetes meal planning guide is a tool to help you plan your meals and snacks. It is important for people with diabetes to manage their blood glucose (sugar) levels. Choosing the right foods and the right amounts throughout your day will help control your blood glucose. Eating right can even help you improve your blood pressure and reach or maintain a healthy weight. CARBOHYDRATE COUNTING MADE EASY When you eat carbohydrates, they turn to sugar. This raises your blood glucose level. Counting carbohydrates can help you control this level so you feel better. When you plan your meals by counting carbohydrates, you can have more flexibility in what you eat and balance your medicine with your food intake. Carbohydrate counting simply means adding up the total amount of carbohydrate grams in your meals and snacks. Try to eat about the same amount at each meal. Foods with carbohydrates are listed below. Each portion below is 1 carbohydrate serving or 15 grams of carbohydrates. Ask your dietician how many grams of carbohydrates you should eat at each meal or snack. Grains and Starches  1 slice bread.   English muffin or hotdog/hamburger bun.   cup cold cereal (unsweetened).   cup cooked pasta or rice.   cup starchy vegetables (corn, potatoes, peas, beans, winter squash).  1 tortilla (6 inches).   bagel.  1 waffle or pancake (size of a CD).   cup cooked cereal.  4 to 6 small crackers. *Whole grain is recommended. Fruit  1 cup fresh unsweetened berries, melon, papaya, pineapple.  1 small fresh fruit.   banana or mango.   cup fruit juice (4 oz unsweetened).   cup canned fruit in natural juice or water.  2 tbs dried fruit.  12 to 15 grapes or cherries. Milk and Yogurt  1 cup fat-free or 1% milk.  1 cup soy milk.  6 oz light yogurt with sugar-free sweetener.  6 oz low-fat soy yogurt.  6 oz plain yogurt. Vegetables  1 cup raw or  cup  cooked is counted as 0 carbohydrates or a "free" food.  If you eat 3 or more servings at 1 meal, count them as 1 carbohydrate serving. Other Carbohydrates   oz chips or pretzels.   cup ice cream or frozen yogurt.   cup sherbet or sorbet.  2 inch square cake, no frosting.  1 tbs honey, sugar, jam, jelly, or syrup.  2 small cookies.  3 squares of graham crackers.  3 cups popcorn.  6 crackers.  1 cup broth-based soup.  Count 1 cup casserole or other mixed foods as 2 carbohydrate servings.  Foods with less than 20 calories in a serving may be counted as 0 carbohydrates or a "free" food. You may want to purchase a book or computer software that lists the carbohydrate gram counts of different foods. In addition, the nutrition facts panel on the labels of the foods you eat are a good source of this information. The label will tell you how big the serving size is and the total number of carbohydrate grams you will be eating per serving. Divide this number by 15 to obtain the number of carbohydrate servings in a portion. Remember, 1 carbohydrate serving equals 15 grams of carbohydrate. SERVING SIZES Measuring foods and serving sizes helps you make sure you are getting the right amount of food. The list below tells how big or small some common serving sizes are.  1 oz.........4 stacked dice.  3 oz.........Deck of cards.  1 tsp........Tip   of little finger.  1 tbs........Thumb.  2 tbs........Golf ball.   cup.......Half of a fist.  1 cup........A fist. SAMPLE DIABETES MEAL PLAN Below is a sample meal plan that includes foods from the grain and starches, dairy, vegetable, fruit, and meat groups. A dietician can individualize a meal plan to fit your calorie needs and tell you the number of servings needed from each food group. However, controlling the total amount of carbohydrates in your meal or snack is more important than making sure you include all of the food groups at every  meal. You may interchange carbohydrate containing foods (dairy, starches, and fruits). The meal plan below is an example of a 2000 calorie diet using carbohydrate counting. This meal plan has 17 carbohydrate servings. Breakfast  1 cup oatmeal (2 carb servings).   cup light yogurt (1 carb serving).  1 cup blueberries (1 carb serving).   cup almonds. Snack  1 large apple (2 carb servings).  1 low-fat string cheese stick. Lunch  Chicken breast salad.  1 cup spinach.   cup chopped tomatoes.  2 oz chicken breast, sliced.  2 tbs low-fat Italian dressing.  12 whole-wheat crackers (2 carb servings).  12 to 15 grapes (1 carb serving).  1 cup low-fat milk (1 carb serving). Snack  1 cup carrots.   cup hummus (1 carb serving). Dinner  3 oz broiled salmon.  1 cup brown rice (3 carb servings). Snack  1  cups steamed broccoli (1 carb serving) drizzled with 1 tsp olive oil and lemon juice.  1 cup light pudding (2 carb servings). DIABETES MEAL PLANNING WORKSHEET Your dietician can use this worksheet to help you decide how many servings of foods and what types of foods are right for you.  BREAKFAST Food Group and Servings / Carb Servings Grain/Starches __________________________________ Dairy __________________________________________ Vegetable ______________________________________ Fruit ___________________________________________ Meat __________________________________________ Fat ____________________________________________ LUNCH Food Group and Servings / Carb Servings Grain/Starches ___________________________________ Dairy ___________________________________________ Fruit ____________________________________________ Meat ___________________________________________ Fat _____________________________________________ DINNER Food Group and Servings / Carb Servings Grain/Starches ___________________________________ Dairy  ___________________________________________ Fruit ____________________________________________ Meat ___________________________________________ Fat _____________________________________________ SNACKS Food Group and Servings / Carb Servings Grain/Starches ___________________________________ Dairy ___________________________________________ Vegetable _______________________________________ Fruit ____________________________________________ Meat ___________________________________________ Fat _____________________________________________ DAILY TOTALS Starches _________________________ Vegetable ________________________ Fruit ____________________________ Dairy ____________________________ Meat ____________________________ Fat ______________________________ Document Released: 03/25/2005 Document Revised: 09/20/2011 Document Reviewed: 02/03/2009 [employer] Patient Information 2014 [employer]. Fat and Cholesterol Control Diet Fat and cholesterol levels in your blood and organs are influenced by your diet. High levels of fat and cholesterol may lead to diseases of the heart, small and large blood vessels, gallbladder, liver, and pancreas. CONTROLLING FAT AND CHOLESTEROL WITH DIET Although exercise and lifestyle factors are important, your diet is key. That is because certain foods are known to raise cholesterol and others to lower it. The goal is to balance foods for their effect on cholesterol and more importantly, to replace saturated and trans fat with other types of fat, such as monounsaturated fat, polyunsaturated fat, and omega-3 fatty acids. On average, a person should consume no more than 15 to 17 g of saturated fat daily. Saturated and trans fats are considered "bad" fats, and they will raise LDL cholesterol. Saturated fats are primarily found in animal products such as meats, butter, and cream. However, that does not mean you need to give up all your favorite foods. Today, there are  good tasting, low-fat, low-cholesterol substitutes for most of the things you like to eat. Choose low-fat or nonfat alternatives. Choose round or   loin cuts of red meat. These types of cuts are lowest in fat and cholesterol. Chicken (without the skin), fish, veal, and ground turkey breast are great choices. Eliminate fatty meats, such as hot dogs and salami. Even shellfish have little or no saturated fat. Have a 3 oz (85 g) portion when you eat lean meat, poultry, or fish. Trans fats are also called "partially hydrogenated oils." They are oils that have been scientifically manipulated so that they are solid at room temperature resulting in a longer shelf life and improved taste and texture of foods in which they are added. Trans fats are found in stick margarine, some tub margarines, cookies, crackers, and baked goods.  When baking and cooking, oils are a great substitute for butter. The monounsaturated oils are especially beneficial since it is believed they lower LDL and raise HDL. The oils you should avoid entirely are saturated tropical oils, such as coconut and palm.  Remember to eat a lot from food groups that are naturally free of saturated and trans fat, including fish, fruit, vegetables, beans, grains (barley, rice, couscous, bulgur wheat), and pasta (without cream sauces).  IDENTIFYING FOODS THAT LOWER FAT AND CHOLESTEROL  Soluble fiber may lower your cholesterol. This type of fiber is found in fruits such as apples, vegetables such as broccoli, potatoes, and carrots, legumes such as beans, peas, and lentils, and grains such as barley. Foods fortified with plant sterols (phytosterol) may also lower cholesterol. You should eat at least 2 g per day of these foods for a cholesterol lowering effect.  Read package labels to identify low-saturated fats, trans fat free, and low-fat foods at the supermarket. Select cheeses that have only 2 to 3 g saturated fat per ounce. Use a heart-healthy tub margarine that  is free of trans fats or partially hydrogenated oil. When buying baked goods (cookies, crackers), avoid partially hydrogenated oils. Breads and muffins should be made from whole grains (whole-wheat or whole oat flour, instead of "flour" or "enriched flour"). Buy non-creamy canned soups with reduced salt and no added fats.  FOOD PREPARATION TECHNIQUES  Never deep-fry. If you must fry, either stir-fry, which uses very little fat, or use non-stick cooking sprays. When possible, broil, bake, or roast meats, and steam vegetables. Instead of putting butter or margarine on vegetables, use lemon and herbs, applesauce, and cinnamon (for squash and sweet potatoes). Use nonfat yogurt, salsa, and low-fat dressings for salads.  LOW-SATURATED FAT / LOW-FAT FOOD SUBSTITUTES Meats / Saturated Fat (g)  Avoid: Steak, marbled (3 oz/85 g) / 11 g  Choose: Steak, lean (3 oz/85 g) / 4 g  Avoid: Hamburger (3 oz/85 g) / 7 g  Choose: Hamburger, lean (3 oz/85 g) / 5 g  Avoid: Ham (3 oz/85 g) / 6 g  Choose: Ham, lean cut (3 oz/85 g) / 2.4 g  Avoid: Chicken, with skin, dark meat (3 oz/85 g) / 4 g  Choose: Chicken, skin removed, dark meat (3 oz/85 g) / 2 g  Avoid: Chicken, with skin, light meat (3 oz/85 g) / 2.5 g  Choose: Chicken, skin removed, light meat (3 oz/85 g) / 1 g Dairy / Saturated Fat (g)  Avoid: Whole milk (1 cup) / 5 g  Choose: Low-fat milk, 2% (1 cup) / 3 g  Choose: Low-fat milk, 1% (1 cup) / 1.5 g  Choose: Skim milk (1 cup) / 0.3 g  Avoid: Hard cheese (1 oz/28 g) / 6 g  Choose: Skim milk cheese (1 oz/28 g) /   2 to 3 g  Avoid: Cottage cheese, 4% fat (1 cup) / 6.5 g  Choose: Low-fat cottage cheese, 1% fat (1 cup) / 1.5 g  Avoid: Ice cream (1 cup) / 9 g  Choose: Sherbet (1 cup) / 2.5 g  Choose: Nonfat frozen yogurt (1 cup) / 0.3 g  Choose: Frozen fruit bar / trace  Avoid: Whipped cream (1 tbs) / 3.5 g  Choose: Nondairy whipped topping (1 tbs) / 1 g Condiments / Saturated Fat  (g)  Avoid: Mayonnaise (1 tbs) / 2 g  Choose: Low-fat mayonnaise (1 tbs) / 1 g  Avoid: Butter (1 tbs) / 7 g  Choose: Extra light margarine (1 tbs) / 1 g  Avoid: Coconut oil (1 tbs) / 11.8 g  Choose: Olive oil (1 tbs) / 1.8 g  Choose: Corn oil (1 tbs) / 1.7 g  Choose: Safflower oil (1 tbs) / 1.2 g  Choose: Sunflower oil (1 tbs) / 1.4 g  Choose: Soybean oil (1 tbs) / 2.4 g  Choose: Canola oil (1 tbs) / 1 g Document Released: 06/28/2005 Document Revised: 10/23/2012 Document Reviewed: 12/17/2010 [employer] Patient Information 2014 [employer].  

## 2013-10-11 NOTE — Progress Notes (Signed)
Patient Demographics  [name], is a 43 y.o. male  STM:[id number]  [id number]:[id number]  DOB - 02-Jul-1971  CC:  Chief Complaint  Patient presents with  . Establish Care       HPI: [name] is a 43 y.o. male here today to establish medical care. history of dilated cardiomyopathy, chronic systolic heart failure, history of V. fib arrest in the past status post ICD in place, patient used to follow up with the cardiologist, as per patient he had ICD checked last year and is also taking amiodrone, patient is going to make an appointment with his cardiologist, he also has history of environmental allergies and take over-the-counter antiallergy medication history of wheezing cough and is using albuterol which helped him with the symptoms patient saw  pulmonologist in the past.patient recently had a blood work done which was reviewed noticed impaired fasting glucose and hemoglobin A1c of 5.8% patient has family history of diabetes, has LDL level more than 100. Patient has No headache, No chest pain, No abdominal pain - No Nausea, No new weakness tingling or numbness, No Cough - SOB.  No Known Allergies Past Medical History  Diagnosis Date  . DCM (dilated cardiomyopathy)   . LV dysfunction     with an EF in 2007 at 34%  . HTN (hypertension)   . Dyslipidemia   . Hx of ventricular fibrillation     Hx of VFib arrest s/p AICD placement  . CHF (congestive heart failure)     chronic   Current Outpatient Prescriptions on File Prior to Visit  Medication Sig Dispense Refill  . amoxicillin-clavulanate (AUGMENTIN) 875-125 MG per tablet Take 1 tablet by mouth 2 (two) times daily.  20 tablet  0  . aspirin 81 MG tablet Take 81 mg by mouth 2 (two) times daily.        . benzonatate (TESSALON) 100 MG capsule Take 1 capsule (100 mg total) by mouth 3 (three) times daily as needed for cough.  21 capsule  0  . diphenhydrAMINE (BENADRYL) 25 MG tablet Take 25 mg by mouth every 6 (six) hours as needed  for itching or allergies.      Marland Kitchen docusate sodium (COLACE) 100 MG capsule Take 1 capsule (100 mg total) by mouth every 12 (twelve) hours.  60 capsule  0  . ipratropium (ATROVENT) 0.06 % nasal spray Place 2 sprays into both nostrils 4 (four) times daily.  15 mL  0  . mometasone-formoterol (DULERA) 100-5 MCG/ACT AERO 2 puffs every 12 hours if needed for breathing  1 Inhaler  0  . olmesartan (BENICAR) 20 MG tablet Take 1 tablet (20 mg total) by mouth daily.      Marland Kitchen oxyCODONE-acetaminophen (PERCOCET) 5-325 MG per tablet 1 to 2 tablets every 6 hours as needed for pain.  20 tablet  0  . predniSONE (DELTASONE) 20 MG tablet Take 2 tablets (40 mg total) by mouth daily.  10 tablet  0  . traMADol (ULTRAM) 50 MG tablet 1-2 every 4 hours as needed for cough or pain  40 tablet  0   No current facility-administered medications on file prior to visit.   Family History  Problem Relation Age of Onset  . Asthma Maternal Aunt   . Diabetes Father    History   Social History  . Marital Status: Married    Spouse Name: N/A    Number of Children: N/A  . Years of Education: N/A   Occupational History  .  Social History Main Topics  . Smoking status: Former Smoker -- 0.25 packs/day for 2 years    Types: Cigarettes    Quit date: 07/12/1998  . Smokeless tobacco: Never Used  . Alcohol Use: No     Comment: Denies  . Drug Use: No  . Sexual Activity: Not on file   Other Topics Concern  . Not on file   Social History Narrative   The patient is married.  He is in native of [place],     living in [place] for 9 years.  He has a history of tobacco use,     but quit smoking a year ago.  Denies alcohol abuse.          Review of Systems: Constitutional: Negative for fever, chills, diaphoresis, activity change, appetite change and fatigue. HENT: Negative for ear pain, nosebleeds, congestion, facial swelling, rhinorrhea, neck pain, neck stiffness and ear discharge.  Eyes: Negative for pain, discharge,  redness, itching and visual disturbance. Respiratory: Negative for cough, choking, chest tightness, shortness of breath, wheezing and stridor.  Cardiovascular: Negative for chest pain, palpitations and leg swelling. Gastrointestinal: Negative for abdominal distention. Genitourinary: Negative for dysuria, urgency, frequency, hematuria, flank pain, decreased urine volume, difficulty urinating and dyspareunia.  Musculoskeletal: Negative for back pain, joint swelling, arthralgia and gait problem. Neurological: Negative for dizziness, tremors, seizures, syncope, facial asymmetry, speech difficulty, weakness, light-headedness, numbness and headaches.  Hematological: Negative for adenopathy. Does not bruise/bleed easily. Psychiatric/Behavioral: Negative for hallucinations, behavioral problems, confusion, dysphoric mood, decreased concentration and agitation.    Objective:   Filed Vitals:   10/11/13 1023  BP: 120/87  Pulse: 62  Temp: 98.2 F (36.8 C)  Resp: 17    Physical Exam: Constitutional: Patient appears well-developed and well-nourished. No distress. HENT: Normocephalic, atraumatic, External right and left ear normal. Oropharynx is clear and moist.  Eyes: Conjunctivae and EOM are normal. PERRLA, no scleral icterus. Neck: Normal ROM. Neck supple. No JVD. No tracheal deviation. No thyromegaly. CVS: RRR, S1/S2 +, no murmurs, no gallops, no carotid bruit. Palpable ICD under skin. Pulmonary: Effort and breath sounds normal, no stridor, rhonchi, wheezes, rales.  Abdominal: Soft. BS +, no distension, tenderness, rebound or guarding.  Musculoskeletal: Normal range of motion. No edema and no tenderness.  Neuro: Alert. Normal reflexes, muscle tone coordination. No cranial nerve deficit. Skin: Skin is warm and dry. No rash noted. Not diaphoretic. No erythema. No pallor. Psychiatric: Normal mood and affect. Behavior, judgment, thought content normal.  Lab Results  Component Value Date   WBC  9.4 04/02/2013   HGB 17.1* 04/02/2013   HCT 46.6 04/02/2013   MCV 82.6 04/02/2013   PLT 185 04/02/2013   Lab Results  Component Value Date   CREATININE 1.10 04/02/2013   BUN 11 04/02/2013   NA 139 04/02/2013   K 4.0 04/02/2013   CL 105 04/02/2013   CO2 25 04/02/2013    Lab Results  Component Value Date   HGBA1C 5.8* 09/27/2013   Lipid Panel     Component Value Date/Time   CHOL 177 09/27/2013 1241   TRIG 248* 09/27/2013 1241   HDL 24* 09/27/2013 1241   CHOLHDL 7.4 09/27/2013 1241   VLDL 50* 09/27/2013 1241   LDLCALC 103* 09/27/2013 1241       Assessment and plan:   1. Chronic systolic heart failure/2. AUTOMATIC IMPLANTABLE CARDIAC DEFIBRILLATOR SITU Patient is encouraged to to make a follow with his cardiologist and get her ICD checked,and medication management of Amiodarone.Marland Kitchen -  carvedilol (COREG) 12.5 MG tablet; Take 1 tablet (12.5 mg total) by mouth 2 (two) times daily with a meal.  Dispense: 60 tablet; Refill: 3 - telmisartan (MICARDIS) 40 MG tablet; Take 1 tablet (40 mg total) by mouth daily.  Dispense: 30 tablet; Refill: 3   3. Environmental allergies OTC anti-allergy medication.  4. IFG (impaired fasting glucose) Advised patient for low carbohydrate diet will repeat blood chemistry prior to the next visit.  - COMPLETE METABOLIC PANEL WITH GFR; Future  5. Other and unspecified hyperlipidemia Have started patient on Zocor.  - simvastatin (ZOCOR) 10 MG tablet; Take 1 tablet (10 mg total) by mouth at bedtime.  Dispense: 90 tablet; Refill: 3 - Lipid panel; Future  6. Screening  - Vit D  25 hydroxy (rtn osteoporosis monitoring); Future - TSH; Future  7. Wheezing  - albuterol (PROVENTIL HFA;VENTOLIN HFA) 108 (90 BASE) MCG/ACT inhaler; Inhale 2 puffs into the lungs every 6 (six) hours as needed for wheezing.  Dispense: 1 Inhaler; Refill: 0    Return in about 3 months (around 01/10/2014) for hypertension, IFG, hyperipidemia.   Lorayne Marek, MD

## 2013-11-09 ENCOUNTER — Ambulatory Visit (INDEPENDENT_AMBULATORY_CARE_PROVIDER_SITE_OTHER): Payer: No Typology Code available for payment source | Admitting: Internal Medicine

## 2013-11-09 ENCOUNTER — Encounter: Payer: Self-pay | Admitting: Internal Medicine

## 2013-11-09 ENCOUNTER — Other Ambulatory Visit: Payer: Self-pay | Admitting: Internal Medicine

## 2013-11-09 VITALS — BP 131/89 | HR 61 | Ht 75.0 in | Wt 246.0 lb

## 2013-11-09 DIAGNOSIS — I4901 Ventricular fibrillation: Secondary | ICD-10-CM

## 2013-11-09 DIAGNOSIS — R05 Cough: Secondary | ICD-10-CM

## 2013-11-09 DIAGNOSIS — I428 Other cardiomyopathies: Secondary | ICD-10-CM

## 2013-11-09 DIAGNOSIS — I5022 Chronic systolic (congestive) heart failure: Secondary | ICD-10-CM

## 2013-11-09 DIAGNOSIS — R059 Cough, unspecified: Secondary | ICD-10-CM

## 2013-11-09 LAB — MDC_IDC_ENUM_SESS_TYPE_INCLINIC
Battery Remaining Longevity: 75.6 mo
Battery Remaining Longevity: 75.6 mo
Brady Statistic RV Percent Paced: 0 %
Brady Statistic RV Percent Paced: 0 %
Date Time Interrogation Session: 20150501100951
HighPow Impedance: 50.7882
HighPow Impedance: 51 Ohm
HighPow Impedance: 50.7882
HighPow Impedance: 51 Ohm
Implantable Pulse Generator Serial Number: 728671
Implantable Pulse Generator Serial Number: 728671
Lead Channel Impedance Value: 412.5 Ohm
Lead Channel Pacing Threshold Amplitude: 0.75 V
Lead Channel Pacing Threshold Pulse Width: 0.4 ms
Lead Channel Pacing Threshold Pulse Width: 0.4 ms
Lead Channel Sensing Intrinsic Amplitude: 11.7 mV
Lead Channel Sensing Intrinsic Amplitude: 11.7 mV
Lead Channel Setting Pacing Amplitude: 2.5 V
Lead Channel Setting Pacing Amplitude: 2.5 V
Lead Channel Setting Pacing Pulse Width: 0.4 ms
Lead Channel Setting Pacing Pulse Width: 0.4 ms
Lead Channel Setting Sensing Sensitivity: 0.5 mV
Lead Channel Setting Sensing Sensitivity: 0.5 mV
MDC IDC MSMT LEADCHNL RV IMPEDANCE VALUE: 412.5 Ohm
MDC IDC MSMT LEADCHNL RV PACING THRESHOLD AMPLITUDE: 0.75 V
MDC IDC SESS DTM: 20150501104938
Zone Setting Detection Interval: 290 ms
Zone Setting Detection Interval: 290 ms

## 2013-11-09 LAB — SEDIMENTATION RATE: Sed Rate: 11 mm/hr (ref 0–22)

## 2013-11-09 MED ORDER — AMIODARONE HCL 200 MG PO TABS: ORAL_TABLET | ORAL | Status: DC

## 2013-11-09 NOTE — Assessment & Plan Note (Signed)
He has had no recurrent ventricular arrhythmias despite being off of amio for 4 weeks. He will restart amio at 100 mg daily, with plans to check ESR.

## 2013-11-09 NOTE — Assessment & Plan Note (Signed)
His symptoms are class 2. He is encouraged to maintain a low sodium diet. And his current meds.

## 2013-11-09 NOTE — Assessment & Plan Note (Signed)
The etiology is unclear. Will check ESR. He continues to cough despite having his ACE inhibitor stopped by Dr. Melvyn Novas.

## 2013-11-09 NOTE — Assessment & Plan Note (Signed)
His [employer] device is working normally. Will recheck in several months.  

## 2013-11-09 NOTE — Progress Notes (Signed)
HPI [name] returns today for followup. He is a pleasant 43 yo man with a h/o atrial fibrillation, non-ischemic CM, chronic systolic heart failure, and ?asthma. He has a cough. His heart failure has been well compensated and his ICD has not gone off. He has been off of his amiodarone for about a month. No edema. No Known Allergies   Current Outpatient Prescriptions  Medication Sig Dispense Refill  . albuterol (PROVENTIL HFA;VENTOLIN HFA) 108 (90 BASE) MCG/ACT inhaler Inhale 2 puffs into the lungs every 6 (six) hours as needed for wheezing.  1 Inhaler  0  . aspirin 81 MG tablet Take 81 mg by mouth 2 (two) times daily.        . carvedilol (COREG) 12.5 MG tablet Take 1 tablet (12.5 mg total) by mouth 2 (two) times daily with a meal.  60 tablet  3  . losartan (COZAAR) 50 MG tablet Take 50 mg by mouth daily.      . mometasone-formoterol (DULERA) 100-5 MCG/ACT AERO 2 puffs every 12 hours if needed for breathing  1 Inhaler  0  . simvastatin (ZOCOR) 10 MG tablet Take 1 tablet (10 mg total) by mouth at bedtime.  90 tablet  3  . amiodarone (PACERONE) 200 MG tablet Take 200 mg by mouth daily. PT STATES HE HAS BEEN OUT OF AMIO FOR 3+ WEEKS (11/09/13)      . benzonatate (TESSALON) 100 MG capsule Take 1 capsule (100 mg total) by mouth 3 (three) times daily as needed for cough.  21 capsule  0   No current facility-administered medications for this visit.     Past Medical History  Diagnosis Date  . DCM (dilated cardiomyopathy)   . LV dysfunction     with an EF in 2007 at 34%  . HTN (hypertension)   . Dyslipidemia   . Hx of ventricular fibrillation     Hx of VFib arrest s/p AICD placement  . CHF (congestive heart failure)     chronic    ROS:   All systems reviewed and negative except as noted in the HPI.   Past Surgical History  Procedure Laterality Date  . Repeat echo  2008     demonstrated near normalization  . Transthoracic echocardiogram  11/09/2005, 01/15/2009, 02/07/2010  .  Cardiac defibrillator placement       Family History  Problem Relation Age of Onset  . Asthma Maternal Aunt   . Diabetes Father      History   Social History  . Marital Status: Married    Spouse Name: N/A    Number of Children: N/A  . Years of Education: N/A   Occupational History  .     Social History Main Topics  . Smoking status: Former Smoker -- 0.25 packs/day for 2 years    Types: Cigarettes    Quit date: 07/12/1998  . Smokeless tobacco: Never Used  . Alcohol Use: No     Comment: Denies  . Drug Use: No  . Sexual Activity: Not on file   Other Topics Concern  . Not on file   Social History Narrative   The patient is married.  He is in native of [place],     living in [place] for 9 years.  He has a history of tobacco use,     but quit smoking a year ago.  Denies alcohol abuse.           BP 131/89  Pulse 61  Ht 6\' 3"  (1.905 m)  Wt 246 lb (111.585 kg)  BMI 30.75 kg/m2  Physical Exam:  Well appearing 43 yo man, NAD HEENT: Unremarkable Neck:  No JVD, no thyromegally Back:  No CVA tenderness Lungs:  Clear except for expiratory wheezes, right more than left. HEART:  Regular rate rhythm, no murmurs, no rubs, no clicks Abd:  soft, positive bowel sounds, no organomegally, no rebound, no guarding Ext:  2 plus pulses, no edema, no cyanosis, no clubbing Skin:  No rashes no nodules Neuro:  CN II through XII intact, motor grossly intact  EKG - NSR  DEVICE  Normal device function.  See PaceArt for details.   Assess/Plan:

## 2013-11-09 NOTE — Patient Instructions (Signed)
Your physician wants you to follow-up in: 12 months with Dr Knox Saliva will receive a reminder letter in the mail two months in advance. If you don't receive a letter, please call our office to schedule the follow-up appointment.  Remote monitoring is used to monitor your Pacemaker of ICD from home. This monitoring reduces the number of office visits required to check your device to one time per year. It allows [place] to keep an eye on the functioning of your device to ensure it is working properly. You are scheduled for a device check from home on 02/11/14. You may send your transmission at any time that day. If you have a wireless device, the transmission will be sent automatically. After your physician reviews your transmission, you will receive a postcard with your next transmission date.  Your physician recommends that you return for lab work in: Sed rate

## 2014-01-10 ENCOUNTER — Ambulatory Visit: Payer: No Typology Code available for payment source | Admitting: Internal Medicine

## 2014-01-21 ENCOUNTER — Encounter (HOSPITAL_COMMUNITY): Payer: Self-pay | Admitting: Emergency Medicine

## 2014-01-21 ENCOUNTER — Emergency Department (INDEPENDENT_AMBULATORY_CARE_PROVIDER_SITE_OTHER): Payer: No Typology Code available for payment source

## 2014-01-21 ENCOUNTER — Emergency Department (INDEPENDENT_AMBULATORY_CARE_PROVIDER_SITE_OTHER)
Admission: EM | Admit: 2014-01-21 | Discharge: 2014-01-21 | Disposition: A | Discharge status: Home / Self Care | Payer: No Typology Code available for payment source | Source: Home / Self Care | Attending: Family Medicine | Admitting: Family Medicine

## 2014-01-21 DIAGNOSIS — J45901 Unspecified asthma with (acute) exacerbation: Secondary | ICD-10-CM

## 2014-01-21 DIAGNOSIS — J4521 Mild intermittent asthma with (acute) exacerbation: Secondary | ICD-10-CM

## 2014-01-21 MED ORDER — ALBUTEROL SULFATE HFA 108 (90 BASE) MCG/ACT IN AERS
2.0000 | INHALATION_SPRAY | RESPIRATORY_TRACT | Status: DC
  Administered 2014-01-21: 2 via RESPIRATORY_TRACT

## 2014-01-21 MED ORDER — IPRATROPIUM BROMIDE 0.02 % IN SOLN
0.5000 mg | Freq: Once | RESPIRATORY_TRACT | Status: AC
  Administered 2014-01-21: 0.5 mg via RESPIRATORY_TRACT

## 2014-01-21 MED ORDER — PREDNISONE 10 MG PO TABS: ORAL_TABLET | ORAL | Status: DC

## 2014-01-21 MED ORDER — ALBUTEROL SULFATE (2.5 MG/3ML) 0.083% IN NEBU
INHALATION_SOLUTION | RESPIRATORY_TRACT | Status: AC
  Filled 2014-01-21: qty 6

## 2014-01-21 MED ORDER — ALBUTEROL SULFATE HFA 108 (90 BASE) MCG/ACT IN AERS
INHALATION_SPRAY | RESPIRATORY_TRACT | Status: AC
Start: 2014-01-21 — End: 2014-01-21
  Filled 2014-01-21: qty 6.7

## 2014-01-21 MED ORDER — PREDNISONE 20 MG PO TABS
60.0000 mg | ORAL_TABLET | Freq: Every day | ORAL | Status: DC
  Administered 2014-01-21: 60 mg via ORAL

## 2014-01-21 MED ORDER — ALBUTEROL SULFATE HFA 108 (90 BASE) MCG/ACT IN AERS: 1.0000 | INHALATION_SPRAY | Freq: Four times a day (QID) | RESPIRATORY_TRACT | Status: DC | PRN

## 2014-01-21 MED ORDER — PREDNISONE 20 MG PO TABS
ORAL_TABLET | ORAL | Status: AC
  Filled 2014-01-21: qty 3

## 2014-01-21 MED ORDER — ALBUTEROL SULFATE (2.5 MG/3ML) 0.083% IN NEBU
5.0000 mg | INHALATION_SOLUTION | Freq: Once | RESPIRATORY_TRACT | Status: AC
  Administered 2014-01-21: 5 mg via RESPIRATORY_TRACT

## 2014-01-21 MED ORDER — IPRATROPIUM BROMIDE 0.02 % IN SOLN
RESPIRATORY_TRACT | Status: AC
  Filled 2014-01-21: qty 2.5

## 2014-01-21 NOTE — ED Provider Notes (Signed)
Medical screening examination/treatment/procedure(s) were performed by resident physician or non-physician practitioner and as supervising physician I was immediately available for consultation/collaboration.   Pauline Good MD.   Billy Fischer, MD 01/21/14 2123

## 2014-01-21 NOTE — ED Notes (Signed)
C/o  Productive cough.  Chest congestion.  Sob.  X 1 wk.  State recent change in medications 4 to 5 months ago.  States "can't get rid of cough".  Mild relief with inhaler and otc meds.  Having heaviness/soreness in chest.  Denies fever, n/v/d

## 2014-01-21 NOTE — Discharge Instructions (Signed)

## 2014-01-21 NOTE — ED Provider Notes (Signed)
CSN: [id number]     Arrival date & time 01/21/14  1551 History   First MD Initiated Contact with Patient 01/21/14 1714     Chief Complaint  Patient presents with  . Cough    chest congestion   (Consider location/radiation/quality/duration/timing/severity/associated sxs/prior Treatment) Patient is a 43 y.o. male presenting with cough. The history is provided by the patient. No language interpreter was used.  Cough Cough characteristics:  Productive Sputum characteristics:  Nondescript Severity:  Moderate Onset quality:  Gradual Duration:  1 week Timing:  Constant Progression:  Worsening Chronicity:  New Smoker: no   Relieved by:  Nothing Worsened by:  Nothing tried Ineffective treatments:  None tried Associated symptoms: chest pain     Past Medical History  Diagnosis Date  . DCM (dilated cardiomyopathy)   . LV dysfunction     with an EF in 2007 at 34%  . HTN (hypertension)   . Dyslipidemia   . Hx of ventricular fibrillation     Hx of VFib arrest s/p AICD placement  . CHF (congestive heart failure)     chronic   Past Surgical History  Procedure Laterality Date  . Repeat echo  2008     demonstrated near normalization  . Transthoracic echocardiogram  11/09/2005, 01/15/2009, 02/07/2010  . Cardiac defibrillator placement     Family History  Problem Relation Age of Onset  . Asthma Maternal Aunt   . Diabetes Father    History  Substance Use Topics  . Smoking status: Former Smoker -- 0.25 packs/day for 2 years    Types: Cigarettes    Quit date: 07/12/1998  . Smokeless tobacco: Never Used  . Alcohol Use: No     Comment: Denies    Review of Systems  Respiratory: Positive for cough.   Cardiovascular: Positive for chest pain.  All other systems reviewed and are negative.   Allergies  Review of patient's allergies indicates no known allergies.  Home Medications   Prior to Admission medications   Medication Sig Start Date End Date Taking? Authorizing Provider   albuterol (PROVENTIL HFA;VENTOLIN HFA) 108 (90 BASE) MCG/ACT inhaler Inhale 2 puffs into the lungs every 6 (six) hours as needed for wheezing. 10/11/13  Yes Lorayne Marek, MD  amiodarone (PACERONE) 200 MG tablet Take as directed 11/09/13  Yes Evans Lance, MD  aspirin 81 MG tablet Take 81 mg by mouth 2 (two) times daily.     Yes Historical Provider, MD  carvedilol (COREG) 12.5 MG tablet Take 1 tablet (12.5 mg total) by mouth 2 (two) times daily with a meal. 10/11/13  Yes Deepak Advani, MD  losartan (COZAAR) 50 MG tablet Take 50 mg by mouth daily.   Yes Historical Provider, MD  mometasone-formoterol [facility]) 100-5 MCG/ACT AERO 2 puffs every 12 hours if needed for breathing 04/02/13  Yes Montine Circle, PA-C  simvastatin (ZOCOR) 10 MG tablet Take 1 tablet (10 mg total) by mouth at bedtime. 10/11/13  Yes Lorayne Marek, MD  benzonatate (TESSALON) 100 MG capsule Take 1 capsule (100 mg total) by mouth 3 (three) times daily as needed for cough. 08/22/13   Annett Gula Presson, PA   BP 133/84  Pulse 63  Temp(Src) 98.2 F (36.8 C) (Oral)  Resp 14  SpO2 97% Physical Exam  Nursing note and vitals reviewed. Constitutional: He is oriented to person, place, and time. He appears well-developed and well-nourished.  HENT:  Head: Normocephalic and atraumatic.  Eyes: Conjunctivae and EOM are normal. Pupils are equal, round, and reactive  to light.  Neck: Normal range of motion.  Cardiovascular: Normal rate and regular rhythm.   Pulmonary/Chest: Effort normal and breath sounds normal.  Abdominal: Soft. Bowel sounds are normal. He exhibits no distension.  Musculoskeletal: Normal range of motion.  Neurological: He is alert and oriented to person, place, and time.  Skin: Skin is warm.  Psychiatric: He has a normal mood and affect.    ED Course  Procedures (including critical care time) Labs Review Labs Reviewed - No data to display  Imaging Review Dg Chest 2 View  01/21/2014   CLINICAL DATA:  Cough.  EXAM:  CHEST  2 VIEW  COMPARISON:  04/02/2013.  FINDINGS: Mediastinum and hilar structures normal. Heart size normal. No pleural effusion pneumothorax. Cardiac pacer with lead tip in right ventricle. No acute bony abnormality.  IMPRESSION: Stable chest.  No acute abnormality.   Electronically Signed   By: Marcello Moores  Register   On: 01/21/2014 17:53     MDM  Albuterol inhaler, prednisone,    See your Physician for recheck in 2-3 days    1. Asthma, mild intermittent, with acute exacerbation       Fransico Meadow, PA-C 01/21/14 1904

## 2014-01-27 ENCOUNTER — Other Ambulatory Visit: Payer: Self-pay | Admitting: Internal Medicine

## 2014-01-27 DIAGNOSIS — I5022 Chronic systolic (congestive) heart failure: Secondary | ICD-10-CM

## 2014-01-29 ENCOUNTER — Encounter: Payer: Self-pay | Admitting: Internal Medicine

## 2014-01-29 ENCOUNTER — Ambulatory Visit: Payer: No Typology Code available for payment source | Attending: Internal Medicine | Admitting: Internal Medicine

## 2014-01-29 VITALS — BP 119/73 | HR 75 | Temp 98.4°F | Resp 16 | Wt 230.4 lb

## 2014-01-29 DIAGNOSIS — I5022 Chronic systolic (congestive) heart failure: Secondary | ICD-10-CM | POA: Insufficient documentation

## 2014-01-29 DIAGNOSIS — I509 Heart failure, unspecified: Secondary | ICD-10-CM | POA: Insufficient documentation

## 2014-01-29 DIAGNOSIS — R7301 Impaired fasting glucose: Secondary | ICD-10-CM | POA: Insufficient documentation

## 2014-01-29 DIAGNOSIS — E785 Hyperlipidemia, unspecified: Secondary | ICD-10-CM | POA: Insufficient documentation

## 2014-01-29 DIAGNOSIS — Z139 Encounter for screening, unspecified: Secondary | ICD-10-CM

## 2014-01-29 DIAGNOSIS — Z87891 Personal history of nicotine dependence: Secondary | ICD-10-CM | POA: Insufficient documentation

## 2014-01-29 DIAGNOSIS — Z79899 Other long term (current) drug therapy: Secondary | ICD-10-CM | POA: Insufficient documentation

## 2014-01-29 DIAGNOSIS — Z95810 Presence of automatic (implantable) cardiac defibrillator: Secondary | ICD-10-CM

## 2014-01-29 DIAGNOSIS — Z7982 Long term (current) use of aspirin: Secondary | ICD-10-CM | POA: Insufficient documentation

## 2014-01-29 DIAGNOSIS — I10 Essential (primary) hypertension: Secondary | ICD-10-CM | POA: Insufficient documentation

## 2014-01-29 DIAGNOSIS — I428 Other cardiomyopathies: Secondary | ICD-10-CM | POA: Insufficient documentation

## 2014-01-29 NOTE — Progress Notes (Signed)
Patient here for follow up and would like to have Blood work drawn as well

## 2014-01-29 NOTE — Progress Notes (Signed)
MRN: [id number] Name: [name]  Sex: male Age: 43 y.o. DOB: 1971-06-19  Allergies: Review of patient's allergies indicates no known allergies.  Chief Complaint  Patient presents with  . Follow-up    HPI: Patient is 43 y.o. male who has  History of hyperlipidemia,CHF has automatic implantable cardiac defibrillator, comes today for followup denies any chest and shortness of breath headache or dizziness recently went to the urgent care with the symptoms of wheezing was given breathing treatment as per patient last time he used albuterol was last week denies any wheezing or shortness of breath. Patient does not smoke cigarettes. Patient has not done blood work and wants to do it today. As per patient his fasting today.  Past Medical History  Diagnosis Date  . DCM (dilated cardiomyopathy)   . LV dysfunction     with an EF in 2007 at 34%  . HTN (hypertension)   . Dyslipidemia   . Hx of ventricular fibrillation     Hx of VFib arrest s/p AICD placement  . CHF (congestive heart failure)     chronic    Past Surgical History  Procedure Laterality Date  . Repeat echo  2008     demonstrated near normalization  . Transthoracic echocardiogram  11/09/2005, 01/15/2009, 02/07/2010  . Cardiac defibrillator placement        Medication List       This list is accurate as of: 01/29/14  4:24 PM.  Always use your most recent med list.               albuterol 108 (90 BASE) MCG/ACT inhaler  Commonly known as:  PROVENTIL HFA;VENTOLIN HFA  Inhale 2 puffs into the lungs every 6 (six) hours as needed for wheezing.     albuterol 108 (90 BASE) MCG/ACT inhaler  Commonly known as:  PROVENTIL HFA;VENTOLIN HFA  Inhale 1-2 puffs into the lungs every 6 (six) hours as needed for wheezing or shortness of breath.     amiodarone 200 MG tablet  Commonly known as:  PACERONE  Take as directed     aspirin 81 MG tablet  Take 81 mg by mouth 2 (two) times daily.     benzonatate 100 MG capsule    Commonly known as:  TESSALON  Take 1 capsule (100 mg total) by mouth 3 (three) times daily as needed for cough.     carvedilol 12.5 MG tablet  Commonly known as:  COREG  TAKE ONE TABLET BY MOUTH TWICE DAILY WITH MEALS     losartan 50 MG tablet  Commonly known as:  COZAAR  Take 50 mg by mouth daily.     mometasone-formoterol 100-5 MCG/ACT Aero  Commonly known as:  DULERA  2 puffs every 12 hours if needed for breathing     predniSONE 10 MG tablet  Commonly known as:  DELTASONE  5,4,3,2,1 taper     simvastatin 10 MG tablet  Commonly known as:  ZOCOR  Take 1 tablet (10 mg total) by mouth at bedtime.        No orders of the defined types were placed in this encounter.    There is no immunization history for the selected administration types on file for this patient.  Family History  Problem Relation Age of Onset  . Asthma Maternal Aunt   . Diabetes Father     History  Substance Use Topics  . Smoking status: Former Smoker -- 0.25 packs/day for 2 years    Types: Cigarettes  Quit date: 07/12/1998  . Smokeless tobacco: Never Used  . Alcohol Use: No     Comment: Denies    Review of Systems   As noted in HPI  Filed Vitals:   01/29/14 1602  BP: 119/73  Pulse: 75  Temp: 98.4 F (36.9 C)  Resp: 16    Physical Exam  Physical Exam  Constitutional: No distress.  Eyes: EOM are normal. Pupils are equal, round, and reactive to light.  Cardiovascular: Normal rate and regular rhythm.   Pulmonary/Chest: No respiratory distress. He has no wheezes. He has no rales.  Musculoskeletal: He exhibits no edema.    CBC    Component Value Date/Time   WBC 9.4 04/02/2013 0728   RBC 5.64 04/02/2013 0728   HGB 17.1* 04/02/2013 0728   HCT 46.6 04/02/2013 0728   PLT 185 04/02/2013 0728   MCV 82.6 04/02/2013 0728   LYMPHSABS 3.8 12/30/2012 0907   MONOABS 0.9 12/30/2012 0907   EOSABS 1.7* 12/30/2012 0907   BASOSABS 0.1 12/30/2012 0907    CMP     Component Value Date/Time   NA  139 04/02/2013 0728   K 4.0 04/02/2013 0728   CL 105 04/02/2013 0728   CO2 25 04/02/2013 0728   GLUCOSE 93 04/02/2013 0728   BUN 11 04/02/2013 0728   CREATININE 1.10 04/02/2013 0728   CALCIUM 9.1 04/02/2013 0728   PROT 7.5 05/27/2012 1903   ALBUMIN 3.5 05/27/2012 1903   AST 22 05/27/2012 1903   ALT 25 05/27/2012 1903   ALKPHOS 82 05/27/2012 1903   BILITOT 0.4 05/27/2012 1903   GFRNONAA 82* 04/02/2013 0728   GFRAA >90 04/02/2013 0728    Lab Results  Component Value Date/Time   CHOL 177 09/27/2013 12:41 PM    No components found with this basename: hga1c    Lab Results  Component Value Date/Time   AST 22 05/27/2012  7:03 PM    Assessment and Plan  Other and unspecified hyperlipidemia - Plan: Will do fasting Lipid panel  IFG (impaired fasting glucose) - Plan: Will check COMPLETE METABOLIC PANEL WITH GFR  Chronic systolic heart failure/Automatic implantable cardiac defibrillator in situ Symptoms are stable patient following up with cardiologist  Screening - Plan: Vit D  25 hydroxy (rtn osteoporosis monitoring), TSH    Return in about 3 months (around 05/01/2014) for IFG, hyperipidemia.  Lorayne Marek, MD

## 2014-01-30 ENCOUNTER — Other Ambulatory Visit: Payer: Self-pay | Admitting: *Deleted

## 2014-01-30 ENCOUNTER — Telehealth: Payer: Self-pay | Admitting: *Deleted

## 2014-01-30 DIAGNOSIS — E559 Vitamin D deficiency, unspecified: Secondary | ICD-10-CM

## 2014-01-30 LAB — COMPLETE METABOLIC PANEL WITH GFR
ALBUMIN: 3.9 g/dL (ref 3.5–5.2)
ALT: 36 U/L (ref 0–53)
AST: 19 U/L (ref 0–37)
Alkaline Phosphatase: 65 U/L (ref 39–117)
BUN: 17 mg/dL (ref 6–23)
CO2: 27 mEq/L (ref 19–32)
Calcium: 9 mg/dL (ref 8.4–10.5)
Chloride: 104 mEq/L (ref 96–112)
Creat: 1.09 mg/dL (ref 0.50–1.35)
GFR, Est African American: >89 mL/min
GFR, Est Non African American: 83 mL/min
GLUCOSE: 87 mg/dL (ref 70–99)
POTASSIUM: 4.1 meq/L (ref 3.5–5.3)
SODIUM: 141 meq/L (ref 135–145)
TOTAL PROTEIN: 6.7 g/dL (ref 6.0–8.3)
Total Bilirubin: 0.8 mg/dL (ref 0.2–1.2)

## 2014-01-30 LAB — LIPID PANEL
CHOL/HDL RATIO: 4.9 ratio
Cholesterol: 142 mg/dL (ref 0–200)
HDL: 29 mg/dL — ABNORMAL LOW (ref >39)
LDL Cholesterol: 73 mg/dL (ref 0–99)
Triglycerides: 200 mg/dL — ABNORMAL HIGH (ref <150)
VLDL: 40 mg/dL (ref 0–40)

## 2014-01-30 LAB — VITAMIN D 25 HYDROXY (VIT D DEFICIENCY, FRACTURES): VIT D 25 HYDROXY: 18 ng/mL — AB (ref 30–89)

## 2014-01-30 LAB — TSH: TSH: 0.951 u[IU]/mL (ref 0.350–4.500)

## 2014-01-30 MED ORDER — VITAMIN D (ERGOCALCIFEROL) 1.25 MG (50000 UNIT) PO CAPS
50000.0000 [IU] | ORAL_CAPSULE | ORAL | Status: DC
Start: 2014-01-30 — End: 2014-02-01

## 2014-01-30 NOTE — Telephone Encounter (Signed)
Pt is aware of his lab results. He said that he would start eating better and will pick up his rx today.

## 2014-01-30 NOTE — Telephone Encounter (Signed)
Message copied by Joan Mayans on Wed Jan 30, 2014 10:07 AM ------      Message from: Lorayne Marek      Created: Wed Jan 30, 2014  9:18 AM       Blood work reviewed, noticed low vitamin D, call patient advise to start ergocalciferol 50,000 units once a week for the duration of  12 weeks.      Also noticed elevated triglycerides, advise patient for low fat diet.        ------

## 2014-01-31 ENCOUNTER — Telehealth: Payer: Self-pay

## 2014-01-31 MED ORDER — VITAMIN D (ERGOCALCIFEROL) 1.25 MG (50000 UNIT) PO CAPS: 50000.0000 [IU] | ORAL_CAPSULE | ORAL | Status: DC

## 2014-01-31 NOTE — Telephone Encounter (Signed)
Patient not available  Left message on machine to return our call 

## 2014-01-31 NOTE — Telephone Encounter (Signed)
Message copied by Dorothe Pea on Thu Jan 31, 2014  2:56 PM ------      Message from: Lorayne Marek      Created: Wed Jan 30, 2014  9:18 AM       Blood work reviewed, noticed low vitamin D, call patient advise to start ergocalciferol 50,000 units once a week for the duration of  12 weeks.      Also noticed elevated triglycerides, advise patient for low fat diet.        ------

## 2014-02-01 ENCOUNTER — Emergency Department (HOSPITAL_COMMUNITY)
Admission: EM | Admit: 2014-02-01 | Discharge: 2014-02-01 | Disposition: A | Discharge status: Home / Self Care | Payer: Self-pay | Attending: Emergency Medicine | Admitting: Emergency Medicine

## 2014-02-01 ENCOUNTER — Emergency Department (HOSPITAL_COMMUNITY): Payer: No Typology Code available for payment source

## 2014-02-01 ENCOUNTER — Encounter (HOSPITAL_COMMUNITY): Payer: Self-pay | Admitting: Emergency Medicine

## 2014-02-01 DIAGNOSIS — Z87891 Personal history of nicotine dependence: Secondary | ICD-10-CM | POA: Insufficient documentation

## 2014-02-01 DIAGNOSIS — E785 Hyperlipidemia, unspecified: Secondary | ICD-10-CM | POA: Insufficient documentation

## 2014-02-01 DIAGNOSIS — I10 Essential (primary) hypertension: Secondary | ICD-10-CM | POA: Insufficient documentation

## 2014-02-01 DIAGNOSIS — Z7982 Long term (current) use of aspirin: Secondary | ICD-10-CM | POA: Insufficient documentation

## 2014-02-01 DIAGNOSIS — R0602 Shortness of breath: Secondary | ICD-10-CM | POA: Insufficient documentation

## 2014-02-01 DIAGNOSIS — Z95810 Presence of automatic (implantable) cardiac defibrillator: Secondary | ICD-10-CM | POA: Insufficient documentation

## 2014-02-01 DIAGNOSIS — I509 Heart failure, unspecified: Secondary | ICD-10-CM | POA: Insufficient documentation

## 2014-02-01 DIAGNOSIS — Z79899 Other long term (current) drug therapy: Secondary | ICD-10-CM | POA: Insufficient documentation

## 2014-02-01 DIAGNOSIS — IMO0002 Reserved for concepts with insufficient information to code with codable children: Secondary | ICD-10-CM | POA: Insufficient documentation

## 2014-02-01 DIAGNOSIS — I4901 Ventricular fibrillation: Secondary | ICD-10-CM | POA: Insufficient documentation

## 2014-02-01 DIAGNOSIS — J45901 Unspecified asthma with (acute) exacerbation: Secondary | ICD-10-CM | POA: Insufficient documentation

## 2014-02-01 LAB — CBC WITH DIFFERENTIAL/PLATELET
Basophils Absolute: 0.1 10*3/uL (ref 0.0–0.1)
Basophils Relative: 1 % (ref 0–1)
Eosinophils Absolute: 1.5 10*3/uL — ABNORMAL HIGH (ref 0.0–0.7)
Eosinophils Relative: 15 % — ABNORMAL HIGH (ref 0–5)
HEMATOCRIT: 48.6 % (ref 39.0–52.0)
HEMOGLOBIN: 16.8 g/dL (ref 13.0–17.0)
LYMPHS ABS: 3.4 10*3/uL (ref 0.7–4.0)
LYMPHS PCT: 34 % (ref 12–46)
MCH: 28.9 pg (ref 26.0–34.0)
MCHC: 34.6 g/dL (ref 30.0–36.0)
MCV: 83.6 fL (ref 78.0–100.0)
MONO ABS: 1.2 10*3/uL — AB (ref 0.1–1.0)
Monocytes Relative: 12 % (ref 3–12)
Neutro Abs: 3.9 10*3/uL (ref 1.7–7.7)
Neutrophils Relative %: 38 % — ABNORMAL LOW (ref 43–77)
Platelets: 138 10*3/uL — ABNORMAL LOW (ref 150–400)
RBC: 5.81 MIL/uL (ref 4.22–5.81)
RDW: 14.6 % (ref 11.5–15.5)
WBC: 10 10*3/uL (ref 4.0–10.5)

## 2014-02-01 LAB — COMPREHENSIVE METABOLIC PANEL
ALBUMIN: 3.7 g/dL (ref 3.5–5.2)
ALK PHOS: 81 U/L (ref 39–117)
ALT: 30 U/L (ref 0–53)
AST: 22 U/L (ref 0–37)
Anion gap: 11 (ref 5–15)
BILIRUBIN TOTAL: 0.5 mg/dL (ref 0.3–1.2)
BUN: 16 mg/dL (ref 6–23)
CHLORIDE: 103 meq/L (ref 96–112)
CO2: 26 mEq/L (ref 19–32)
Calcium: 9 mg/dL (ref 8.4–10.5)
Creatinine, Ser: 1.1 mg/dL (ref 0.50–1.35)
GFR calc Af Amer: >90 mL/min (ref >90)
GFR calc non Af Amer: 81 mL/min — ABNORMAL LOW (ref >90)
Glucose, Bld: 119 mg/dL — ABNORMAL HIGH (ref 70–99)
POTASSIUM: 4.2 meq/L (ref 3.7–5.3)
Sodium: 140 mEq/L (ref 137–147)
Total Protein: 7.2 g/dL (ref 6.0–8.3)

## 2014-02-01 LAB — I-STAT TROPONIN, ED: Troponin i, poc: 0 ng/mL (ref 0.00–0.08)

## 2014-02-01 LAB — PRO B NATRIURETIC PEPTIDE: Pro B Natriuretic peptide (BNP): 131.2 pg/mL — ABNORMAL HIGH (ref 0–125)

## 2014-02-01 MED ORDER — PREDNISONE 50 MG PO TABS: ORAL_TABLET | ORAL | Status: DC

## 2014-02-01 MED ORDER — ALBUTEROL SULFATE HFA 108 (90 BASE) MCG/ACT IN AERS
1.0000 | INHALATION_SPRAY | RESPIRATORY_TRACT | Status: DC | PRN
  Administered 2014-02-01: 2 via RESPIRATORY_TRACT
  Filled 2014-02-01: qty 6.7

## 2014-02-01 MED ORDER — METHYLPREDNISOLONE SODIUM SUCC 125 MG IJ SOLR
125.0000 mg | Freq: Once | INTRAMUSCULAR | Status: AC
  Administered 2014-02-01: 125 mg via INTRAVENOUS
  Filled 2014-02-01: qty 2

## 2014-02-01 MED ORDER — IPRATROPIUM-ALBUTEROL 0.5-2.5 (3) MG/3ML IN SOLN
3.0000 mL | Freq: Once | RESPIRATORY_TRACT | Status: AC
  Administered 2014-02-01: 3 mL via RESPIRATORY_TRACT
  Filled 2014-02-01: qty 3

## 2014-02-01 MED ORDER — ALBUTEROL (5 MG/ML) CONTINUOUS INHALATION SOLN
10.0000 mg/h | INHALATION_SOLUTION | RESPIRATORY_TRACT | Status: DC
  Administered 2014-02-01: 10 mg/h via RESPIRATORY_TRACT
  Filled 2014-02-01: qty 20

## 2014-02-01 NOTE — ED Notes (Signed)
Patient & family requesting update. MD notified.

## 2014-02-01 NOTE — ED Notes (Signed)
Patient is alert and oriented x3.  He was given DC instructions and follow up visit instructions.  Patient gave verbal understanding.  He was DC ambulatory under his own power to home.  V/S stable.  He was not showing any signs of distress on DC 

## 2014-02-01 NOTE — ED Provider Notes (Signed)
CSN: [id number]     Arrival date & time 02/01/14  0136 History   First MD Initiated Contact with Patient 02/01/14 0147     Chief Complaint  Patient presents with  . Shortness of Breath     (Consider location/radiation/quality/duration/timing/severity/associated sxs/prior Treatment) HPI Patient developed increasing shortness of breath starting yesterday evening and progressing steadily throughout the night. He states the shortness of breath is worse when lying flat. He's had increased wheezing. He specifically denies any chest pain or abdominal pain. Denies any cough. He's had no fever or chills. Denies any lower extremity swelling or pain. Past Medical History  Diagnosis Date  . DCM (dilated cardiomyopathy)   . LV dysfunction     with an EF in 2007 at 34%  . HTN (hypertension)   . Dyslipidemia   . Hx of ventricular fibrillation     Hx of VFib arrest s/p AICD placement  . CHF (congestive heart failure)     chronic   Past Surgical History  Procedure Laterality Date  . Repeat echo  2008     demonstrated near normalization  . Transthoracic echocardiogram  11/09/2005, 01/15/2009, 02/07/2010  . Cardiac defibrillator placement     Family History  Problem Relation Age of Onset  . Asthma Maternal Aunt   . Diabetes Father    History  Substance Use Topics  . Smoking status: Former Smoker -- 0.25 packs/day for 2 years    Types: Cigarettes    Quit date: 07/12/1998  . Smokeless tobacco: Never Used  . Alcohol Use: No     Comment: Denies    Review of Systems  Constitutional: Negative for fever and chills.  Respiratory: Positive for shortness of breath and wheezing. Negative for cough and chest tightness.   Cardiovascular: Negative for chest pain, palpitations and leg swelling.  Gastrointestinal: Negative for nausea, vomiting, abdominal pain, diarrhea and constipation.  Musculoskeletal: Negative for back pain, myalgias, neck pain and neck stiffness.  Skin: Negative for rash and  wound.  Neurological: Negative for dizziness, weakness, light-headedness, numbness and headaches.  All other systems reviewed and are negative.     Allergies  Review of patient's allergies indicates no known allergies.  Home Medications   Prior to Admission medications   Medication Sig Start Date End Date Taking? Authorizing Provider  albuterol (PROVENTIL HFA;VENTOLIN HFA) 108 (90 BASE) MCG/ACT inhaler Inhale 1-2 puffs into the lungs every 6 (six) hours as needed for wheezing or shortness of breath. 01/21/14  Yes Hollace Kinnier Sofia, PA-C  amiodarone (PACERONE) 200 MG tablet Take 200 mg by mouth daily.   Yes Historical Provider, MD  aspirin 81 MG tablet Take 81 mg by mouth 2 (two) times daily.     Yes Historical Provider, MD  carvedilol (COREG) 12.5 MG tablet Take 12.5 mg by mouth 2 (two) times daily with a meal.   Yes Historical Provider, MD  losartan (COZAAR) 50 MG tablet Take 50 mg by mouth daily.   Yes Historical Provider, MD  simvastatin (ZOCOR) 10 MG tablet Take 1 tablet (10 mg total) by mouth at bedtime. 10/11/13  Yes Lorayne Marek, MD  predniSONE (DELTASONE) 50 MG tablet Take 1 tablet by mouth every day for 5 days 02/01/14   Julianne Rice, MD  Vitamin D, Ergocalciferol, (DRISDOL) 50000 UNITS CAPS capsule Take 1 capsule (50,000 Units total) by mouth every 7 (seven) days. 01/31/14   Lorayne Marek, MD   BP 126/68  Pulse 58  Temp(Src) 98 F (36.7 C) (Oral)  Resp 18  SpO2 98% Physical Exam  Nursing note and vitals reviewed. Constitutional: He is oriented to person, place, and time. He appears well-developed and well-nourished. No distress.   Patient is receiving breathing treatment currently.  HENT:  Head: Normocephalic and atraumatic.  Mouth/Throat: Oropharynx is clear and moist.  Eyes: EOM are normal. Pupils are equal, round, and reactive to light.  Neck: Normal range of motion. Neck supple.  Cardiovascular: Normal rate and regular rhythm.   Pulmonary/Chest: Effort normal. No  respiratory distress. He has wheezes (diffuse expiratory wheezing). He has no rales. He exhibits no tenderness.  Abdominal: Soft. Bowel sounds are normal.  Musculoskeletal: Normal range of motion. He exhibits no edema and no tenderness.  No calf swelling or tenderness.  Neurological: He is alert and oriented to person, place, and time.  Results from this without deficit. Sensation is grossly intact.  Skin: Skin is warm and dry. No rash noted. No erythema.  Psychiatric: He has a normal mood and affect. His behavior is normal.    ED Course  Procedures (including critical care time) Labs Review Labs Reviewed  COMPREHENSIVE METABOLIC PANEL - Abnormal; Notable for the following:    Glucose, Bld 119 (*)    GFR calc non Af Amer 81 (*)    All other components within normal limits  PRO B NATRIURETIC PEPTIDE - Abnormal; Notable for the following:    Pro B Natriuretic peptide (BNP) 131.2 (*)    All other components within normal limits  CBC WITH DIFFERENTIAL  Randolm Idol, ED    Imaging Review Dg Chest 2 View  02/01/2014   CLINICAL DATA:  Shortness of breath, now worsening.  EXAM: CHEST  2 VIEW  COMPARISON:  Chest radiograph January 21, 2014  FINDINGS: Cardiomediastinal silhouette is unremarkable. Single lead left cardiac defibrillator in situ. Prominent central bronchovascular markings The lungs are clear without pleural effusions or focal consolidations. Increased lung volumes. Trachea projects midline and there is no pneumothorax. Soft tissue planes and included osseous structures are non-suspicious. Multiple EKG lines overlie the patient and may obscure subtle underlying pathology.  IMPRESSION: Stable prominent central bronchovascular markings can be seen with reactive airway disease without focal consolidation.   Electronically Signed   By: Elon Alas   On: 02/01/2014 02:30     EKG Interpretation   Date/Time:  Friday February 01 2014 01:41:00 EDT Ventricular Rate:  70 PR Interval:   176 QRS Duration: 83 QT Interval:  412 QTC Calculation: 445 R Axis:   67 Text Interpretation:  Sinus rhythm Anteroseptal infarct, age indeterminate  Lateral leads are also involved Confirmed by Lita Mains  MD, Tearsa Kowalewski ([id number])  on 02/01/2014 5:13:20 AM      MDM   Final diagnoses:  Asthma exacerbation    Patient states he is breathing much better than on arrival. We'll continue to monitor closely.  Patient's wheezing has significantly improved. His vital signs are stabilized. Discharge home with family. Return precautions given.  Julianne Rice, MD 02/01/14 [phone]

## 2014-02-01 NOTE — ED Notes (Signed)
RT requested to bedside

## 2014-02-01 NOTE — Discharge Instructions (Signed)

## 2014-02-01 NOTE — ED Notes (Signed)
Patient is alert and oriented x3.  He is complaining of shortness of breath. Patient has a history of CHF.  Wife states that the patient starting having symptoms Last night at 17:30.

## 2014-02-01 NOTE — ED Notes (Signed)
EKG given to EDP,[place]elver[place],MD., for review. 

## 2014-02-04 ENCOUNTER — Encounter: Payer: Self-pay | Admitting: Internal Medicine

## 2014-02-04 ENCOUNTER — Ambulatory Visit (INDEPENDENT_AMBULATORY_CARE_PROVIDER_SITE_OTHER): Payer: No Typology Code available for payment source | Admitting: Internal Medicine

## 2014-02-04 VITALS — BP 124/80 | HR 60 | Temp 98.3°F | Ht 75.0 in | Wt 231.0 lb

## 2014-02-04 DIAGNOSIS — J4551 Severe persistent asthma with (acute) exacerbation: Secondary | ICD-10-CM

## 2014-02-04 DIAGNOSIS — J45901 Unspecified asthma with (acute) exacerbation: Secondary | ICD-10-CM

## 2014-02-04 DIAGNOSIS — J4520 Mild intermittent asthma, uncomplicated: Secondary | ICD-10-CM | POA: Insufficient documentation

## 2014-02-04 MED ORDER — PREDNISONE 10 MG PO TABS: ORAL_TABLET | ORAL | Status: DC

## 2014-02-04 MED ORDER — BUDESONIDE-FORMOTEROL FUMARATE 80-4.5 MCG/ACT IN AERO: INHALATION_SPRAY | RESPIRATORY_TRACT | Status: DC

## 2014-02-04 NOTE — Patient Instructions (Signed)
I will notify Dr Lovena Le that I recommend you stop coreg and replace with bisoprolol.  Prednisone 10 mg take  4 each am x 2 days,   2 each am x 2 days,  1 each am x 2 days and stop   Symbicort 80 Take 2 puffs first thing in am and then another 2 puffs about 12 hours later.   Please schedule a follow up office visit in 2  weeks, sooner if needed

## 2014-02-04 NOTE — Progress Notes (Signed)
Subjective:    Patient ID: [name], male    DOB: [birth year]  MRN: [id number]    Brief patient profile:  43 yo [place] male quit smoking in 2000 self referred to pulmonary clinic 01/18/2013 for refractory cough and sob    History of Present Illness  01/18/2013 1st pulmonary ov on acei  cc new onset cough and wheeze and sob since Oct 2013 only 50%  better p albuterol / prednisone courses  for a week or so now bad again x one week with slt discolored sputum and cough worse at hs.  No longer has alb rec Stop lisinopril Start benicar 20 mg one daily x 6 weeks - if better see Harwani, if worse see me Zpak and prednisone were called in For short of breath ok to use dulera 100 2 puffs every 12 hours but should not need after a month Take delsym two tsp every 12 hours and supplement if needed with  tramadol 50 mg up to 2 every 4 hours   Once you have eliminated the cough for 3 straight days try reducing the tramadol first,  then the delsym as tolerated.     02/04/2014 f/u ov/[name] re: variable sob >> cough in ER 3 x last 6 m "heart ok"  Chief Complaint  Patient presents with  . Follow-up    Pt c/o increased cough for the past month.  Cough is non prod and is worse at night.  He also c/o increased SOB- using rescue inhaler daily- 10-15 times per day.     Last well enough in april 2015 to be able to play soccer but gradually worse sob and noct cough since then much better after albuterol and esp prednisone.  No obvious day to day or daytime variabilty or assoc  cp or chest tightness, subjective wheeze overt sinus or hb symptoms. No unusual exp hx or h/o childhood pna/ asthma or knowledge of premature birth.  Sleeping ok without nocturnal  or early am exacerbation  of respiratory  c/o's or need for noct saba. Also denies any obvious fluctuation of symptoms with weather or environmental changes or other aggravating or alleviating factors except as outlined above   Current Medications,  Allergies, Complete Past Medical History, Past Surgical History, Family History, and Social History were reviewed in [employer] record.  ROS  The following are not active complaints unless bolded sore throat, dysphagia, dental problems, itching, sneezing,  nasal congestion or excess/ purulent secretions, ear ache,   fever, chills, sweats, unintended wt loss, pleuritic or exertional cp, hemoptysis,  orthopnea pnd or leg swelling, presyncope, palpitations, heartburn, abdominal pain, anorexia, nausea, vomiting, diarrhea  or change in bowel or urinary habits, change in stools or urine, dysuria,hematuria,  rash, arthralgias, visual complaints, headache, numbness weakness or ataxia or problems with walking or coordination,  change in mood/affect or memory.             Objective:   Physical Exam    amb bm nad at rest    02/04/2014       231  Wt Readings from Last 3 Encounters:  01/18/13 234 lb (106.142 kg)  05/20/11 245 lb (111.131 kg)  05/26/10 243 lb (110.224 kg)     HEENT: nl dentition, turbinates, and orophanx. Nl external ear canals without cough reflex   NECK :  without JVD/Nodes/TM/ nl carotid upstrokes bilaterally   LUNGS: no acc muscle use, insp and exp rhonchi/ exp cough   CV:  RRR  no  s3 or murmur or increase in P2, no edema   ABD:  soft and nontender with nl excursion in the supine position. No bruits or organomegaly, bowel sounds nl  MS:  warm without deformities, calf tenderness, cyanosis or clubbing  SKIN: warm and dry without lesions    NEURO:  alert, approp, no deficits    chest X-ray  02/01/14 Stable prominent central bronchovascular markings can be seen with  reactive airway disease without focal consolidation.   Recent Labs Lab 01/29/14 1624 02/01/14 0146  NA 141 140  K 4.1 4.2  CL 104 103  CO2 27 26  BUN 17 16  CREATININE 1.09 1.10  GLUCOSE 87 119*    Recent Labs Lab 02/01/14 0146  HGB 16.8  HCT 48.6  WBC 10.0  PLT  138*      Lab Results  Component Value Date   PROBNP 131.2* 02/01/2014        Assessment & Plan:

## 2014-02-04 NOTE — Assessment & Plan Note (Signed)
DDX of  difficult airways management all start with A and  include Adherence, Ace Inhibitors, Acid Reflux, Active Sinus Disease, Alpha 1 Antitripsin deficiency, Anxiety masquerading as Airways dz,  ABPA,  allergy(esp in young), Aspiration (esp in elderly), Adverse effects of DPI,  Active smokers, plus two Bs  = Bronchiectasis and Beta blocker use..and one C= CHF  In this case Adherence is the biggest issue and starts with  inability to use HFA effectively and also  understand that SABA treats the symptoms but doesn't get to the underlying problem (inflammation).  I used  the analogy of putting steroid cream on a rash to help explain the meaning of topical therapy and the need to get the drug to the target tissue.   The proper method of use, as well as anticipated side effects, of a metered-dose inhaler are discussed and demonstrated to the patient. Improved effectiveness after extensive coaching during this visit to a level of approximately  75%  ? Beta blocker effect > needs trial off coreg > will send note to Dr Lovena Le   ? acei > off since 01/2013 and definitely improved so avoid

## 2014-02-11 ENCOUNTER — Other Ambulatory Visit: Payer: Self-pay | Admitting: Internal Medicine

## 2014-02-11 DIAGNOSIS — I5043 Acute on chronic combined systolic (congestive) and diastolic (congestive) heart failure: Secondary | ICD-10-CM

## 2014-02-14 ENCOUNTER — Other Ambulatory Visit: Payer: Self-pay | Admitting: *Deleted

## 2014-02-14 ENCOUNTER — Telehealth: Payer: Self-pay | Admitting: *Deleted

## 2014-02-14 MED ORDER — BISOPROLOL FUMARATE 5 MG PO TABS: 5.0000 mg | ORAL_TABLET | Freq: Every day | ORAL | Status: DC

## 2014-02-14 NOTE — Telephone Encounter (Signed)
Tried to call patient to let him know Dr Lovena Le says it is ok to stop Carvedilol and start Bisoprolol 5mg  daily as Dr Melvyn Novas suggested.  I have called the medication into his pharmacy and I am not able to leave message for patient as mailbox is full

## 2014-02-14 NOTE — Progress Notes (Signed)
Will have patient stop Carvedilol and start Bisoprolol 5mg  daily

## 2014-02-18 ENCOUNTER — Telehealth: Payer: Self-pay | Admitting: Internal Medicine

## 2014-02-18 ENCOUNTER — Ambulatory Visit: Payer: Self-pay | Attending: Internal Medicine

## 2014-02-18 ENCOUNTER — Ambulatory Visit: Payer: Self-pay | Admitting: Internal Medicine

## 2014-02-18 NOTE — Telephone Encounter (Signed)
Left message with person who answered call back number listed for pt to have pt call office.

## 2014-02-18 NOTE — Telephone Encounter (Signed)
Follow Up.  Pt returned call from 02/14/2014 per kelly. Pt requests a call back to discuss.

## 2014-02-20 NOTE — Telephone Encounter (Signed)
Follow up     Pt is returning a nurses call from several days ago

## 2014-02-20 NOTE — Telephone Encounter (Signed)
Contacted pt to inform him that Claiborne Billings tried to call him to let him know that Dr Lovena Le says it is ok to stop Carvedilol and start Bisoprolol 5mg  daily as Dr Melvyn Novas suggested. Informed pt that Claiborne Billings called the medication into his pharmacy.  Pt verbalized understanding and agrees with this plan.

## 2014-02-22 ENCOUNTER — Ambulatory Visit: Payer: Self-pay | Admitting: Internal Medicine

## 2014-04-05 ENCOUNTER — Other Ambulatory Visit: Payer: Self-pay | Admitting: Internal Medicine

## 2014-04-09 ENCOUNTER — Other Ambulatory Visit: Payer: Self-pay

## 2014-04-15 ENCOUNTER — Telehealth: Payer: Self-pay | Admitting: Internal Medicine

## 2014-04-15 NOTE — Telephone Encounter (Signed)
Patient has called in today for an appointment with the PCP for next week; patient has a cough and was told maybe a nurse could see/treat him for; please f/u with patient to let him know if this is ok

## 2014-04-16 ENCOUNTER — Telehealth: Payer: Self-pay | Admitting: Emergency Medicine

## 2014-04-16 NOTE — Telephone Encounter (Signed)
Left message for pt to call th schedule nurse visit

## 2014-05-06 ENCOUNTER — Other Ambulatory Visit: Payer: Self-pay

## 2014-05-06 MED ORDER — AMIODARONE HCL 200 MG PO TABS: 200.0000 mg | ORAL_TABLET | Freq: Every day | ORAL | Status: DC

## 2014-07-16 ENCOUNTER — Ambulatory Visit: Payer: Self-pay | Attending: Internal Medicine | Admitting: Internal Medicine

## 2014-07-16 ENCOUNTER — Encounter: Payer: Self-pay | Admitting: Internal Medicine

## 2014-07-16 VITALS — BP 143/88 | HR 68 | Temp 98.1°F | Resp 16 | Wt 232.0 lb

## 2014-07-16 DIAGNOSIS — J45909 Unspecified asthma, uncomplicated: Secondary | ICD-10-CM | POA: Insufficient documentation

## 2014-07-16 DIAGNOSIS — I5022 Chronic systolic (congestive) heart failure: Secondary | ICD-10-CM | POA: Insufficient documentation

## 2014-07-16 DIAGNOSIS — Z87891 Personal history of nicotine dependence: Secondary | ICD-10-CM | POA: Insufficient documentation

## 2014-07-16 DIAGNOSIS — Z95810 Presence of automatic (implantable) cardiac defibrillator: Secondary | ICD-10-CM | POA: Insufficient documentation

## 2014-07-16 DIAGNOSIS — Z7982 Long term (current) use of aspirin: Secondary | ICD-10-CM | POA: Insufficient documentation

## 2014-07-16 DIAGNOSIS — Z7951 Long term (current) use of inhaled steroids: Secondary | ICD-10-CM | POA: Insufficient documentation

## 2014-07-16 DIAGNOSIS — E785 Hyperlipidemia, unspecified: Secondary | ICD-10-CM | POA: Insufficient documentation

## 2014-07-16 DIAGNOSIS — I10 Essential (primary) hypertension: Secondary | ICD-10-CM | POA: Insufficient documentation

## 2014-07-16 LAB — COMPLETE METABOLIC PANEL WITH GFR
ALK PHOS: 77 U/L (ref 39–117)
ALT: 31 U/L (ref 0–53)
AST: 27 U/L (ref 0–37)
Albumin: 4.1 g/dL (ref 3.5–5.2)
BILIRUBIN TOTAL: 0.7 mg/dL (ref 0.2–1.2)
BUN: 14 mg/dL (ref 6–23)
CO2: 31 mEq/L (ref 19–32)
Calcium: 9.6 mg/dL (ref 8.4–10.5)
Chloride: 104 mEq/L (ref 96–112)
Creat: 1.18 mg/dL (ref 0.50–1.35)
GFR, Est African American: 87 mL/min
GFR, Est Non African American: 75 mL/min
Glucose, Bld: 85 mg/dL (ref 70–99)
POTASSIUM: 4.6 meq/L (ref 3.5–5.3)
Sodium: 142 mEq/L (ref 135–145)
TOTAL PROTEIN: 7.6 g/dL (ref 6.0–8.3)

## 2014-07-16 LAB — LIPID PANEL
CHOLESTEROL: 200 mg/dL (ref 0–200)
HDL: 30 mg/dL — ABNORMAL LOW (ref >39)
LDL Cholesterol: 117 mg/dL — ABNORMAL HIGH (ref 0–99)
Total CHOL/HDL Ratio: 6.7 Ratio
Triglycerides: 267 mg/dL — ABNORMAL HIGH (ref <150)
VLDL: 53 mg/dL — AB (ref 0–40)

## 2014-07-16 MED ORDER — BUDESONIDE-FORMOTEROL FUMARATE 80-4.5 MCG/ACT IN AERO: INHALATION_SPRAY | RESPIRATORY_TRACT | Status: DC

## 2014-07-16 MED ORDER — ALBUTEROL SULFATE HFA 108 (90 BASE) MCG/ACT IN AERS: 1.0000 | INHALATION_SPRAY | Freq: Four times a day (QID) | RESPIRATORY_TRACT | Status: DC | PRN

## 2014-07-16 NOTE — Progress Notes (Signed)
Patient here for follow up on his cholesterol and CHF [place] and needs medication refills Patient is requesting a prescription for pro air inhaler

## 2014-07-16 NOTE — Progress Notes (Signed)
MRN: [id number] Name: [name]  Sex: male Age: 44 y.o. DOB: 10-Mar-1971  Allergies: Review of patient's allergies indicates no known allergies.  Chief Complaint  Patient presents with  . Follow-up    HPI: Patient is 44 y.o. male who history of chronic systolic heart failure, AICD in place, hyperlipidemia, patient follows up with pulmonology and cardiology, as per patient his Coreg medication was switched to bisoprolol by his pulmonologist/cardiologist, patient does complain of wheezing and has been using albuterol more often, EMR reviewed patient was given the prescription of Symbicort  by his pulmonologist as per patient he has not used the medication, currently denies any fever chills has occasional cough and wheezing, patient denies smoking cigarettes. Patient is requesting refill on albuterol medication.  Past Medical History  Diagnosis Date  . DCM (dilated cardiomyopathy)   . LV dysfunction     with an EF in 2007 at 34%  . HTN (hypertension)   . Dyslipidemia   . Hx of ventricular fibrillation     Hx of VFib arrest s/p AICD placement  . CHF (congestive heart failure)     chronic    Past Surgical History  Procedure Laterality Date  . Repeat echo  2008     demonstrated near normalization  . Transthoracic echocardiogram  11/09/2005, 01/15/2009, 02/07/2010  . Cardiac defibrillator placement        Medication List       This list is accurate as of: 07/16/14  1:01 PM.  Always use your most recent med list.               albuterol 108 (90 BASE) MCG/ACT inhaler  Commonly known as:  PROVENTIL HFA;VENTOLIN HFA  Inhale 1-2 puffs into the lungs every 6 (six) hours as needed for wheezing or shortness of breath.     amiodarone 200 MG tablet  Commonly known as:  PACERONE  Take 1 tablet (200 mg total) by mouth daily.     aspirin 81 MG tablet  Take 81 mg by mouth 2 (two) times daily.     bisoprolol 5 MG tablet  Commonly known as:  ZEBETA  Take 1 tablet (5 mg total)  by mouth daily.     budesonide-formoterol 80-4.5 MCG/ACT inhaler  Commonly known as:  SYMBICORT  Take 2 puffs first thing in am and then another 2 puffs about 12 hours later.     losartan 50 MG tablet  Commonly known as:  COZAAR  TAKE 1 TABLET BY MOUTH ONCE DAILY     predniSONE 10 MG tablet  Commonly known as:  DELTASONE  Take  4 each am x 2 days,   2 each am x 2 days,  1 each am x 2 days and stop     simvastatin 10 MG tablet  Commonly known as:  ZOCOR  Take 1 tablet (10 mg total) by mouth at bedtime.     Vitamin D (Ergocalciferol) 50000 UNITS Caps capsule  Commonly known as:  DRISDOL  Take 1 capsule (50,000 Units total) by mouth every 7 (seven) days.        Meds ordered this encounter  Medications  . albuterol (PROVENTIL HFA;VENTOLIN HFA) 108 (90 BASE) MCG/ACT inhaler    Sig: Inhale 1-2 puffs into the lungs every 6 (six) hours as needed for wheezing or shortness of breath.    Dispense:  1 Inhaler    Refill:  3  . budesonide-formoterol (SYMBICORT) 80-4.5 MCG/ACT inhaler    Sig: Take 2 puffs first  thing in am and then another 2 puffs about 12 hours later.    Dispense:  1 Inhaler    Refill:  5    There is no immunization history for the selected administration types on file for this patient.  Family History  Problem Relation Age of Onset  . Asthma Maternal Aunt   . Diabetes Father     History  Substance Use Topics  . Smoking status: Former Smoker -- 0.25 packs/day for 2 years    Types: Cigarettes    Quit date: 07/12/1998  . Smokeless tobacco: Never Used  . Alcohol Use: No     Comment: Denies    Review of Systems   As noted in HPI  Filed Vitals:   07/16/14 1231  BP: 143/88  Pulse: 68  Temp: 98.1 F (36.7 C)  Resp: 16    Physical Exam  Physical Exam  Constitutional: No distress.  Eyes: EOM are normal. Pupils are equal, round, and reactive to light.  Cardiovascular: Normal rate and regular rhythm.   Pulmonary/Chest: Breath sounds normal. No  respiratory distress. He has no rales.  Minimal wheezing   Musculoskeletal: He exhibits no edema.    CBC    Component Value Date/Time   WBC 10.0 02/01/2014 0146   RBC 5.81 02/01/2014 0146   HGB 16.8 02/01/2014 0146   HCT 48.6 02/01/2014 0146   PLT 138* 02/01/2014 0146   MCV 83.6 02/01/2014 0146   LYMPHSABS 3.4 02/01/2014 0146   MONOABS 1.2* 02/01/2014 0146   EOSABS 1.5* 02/01/2014 0146   BASOSABS 0.1 02/01/2014 0146    CMP     Component Value Date/Time   NA 140 02/01/2014 0146   K 4.2 02/01/2014 0146   CL 103 02/01/2014 0146   CO2 26 02/01/2014 0146   GLUCOSE 119* 02/01/2014 0146   BUN 16 02/01/2014 0146   CREATININE 1.10 02/01/2014 0146   CREATININE 1.09 01/29/2014 1624   CALCIUM 9.0 02/01/2014 0146   PROT 7.2 02/01/2014 0146   ALBUMIN 3.7 02/01/2014 0146   AST 22 02/01/2014 0146   ALT 30 02/01/2014 0146   ALKPHOS 81 02/01/2014 0146   BILITOT 0.5 02/01/2014 0146   GFRNONAA 81* 02/01/2014 0146   GFRNONAA 83 01/29/2014 1624   GFRAA >90 02/01/2014 0146   GFRAA >89 01/29/2014 1624    Lab Results  Component Value Date/Time   CHOL 142 01/29/2014 04:24 PM    No components found for: HGA1C  Lab Results  Component Value Date/Time   AST 22 02/01/2014 01:46 AM    Assessment and Plan  Chronic systolic heart failure - Plan: currently patient is following up with her cardiologist and is taking amiodarone, bisoprolol, losartan and on statins COMPLETE METABOLIC PANEL WITH GFR  Hyperlipemia - Plan:patient is on Zocor 10 mg daily, will repeat  Lipid panel  Asthma, chronic, unspecified asthma severity, uncomplicated - Plan: patient is given refill on albuterol, also he has not started her Symbicort yet so I have given him the prescription .albuterol (PROVENTIL HFA;VENTOLIN HFA) 108 (90 BASE) MCG/ACT inhaler, budesonide-formoterol (SYMBICORT) 80-4.5 MCG/ACT inhaler  Patient is going out of country for a few weeks she will follow up with his cardiologist and  pulmonologist.  Return in about 3 months (around 10/15/2014) for hyperipidemia.  Lorayne Marek, MD

## 2014-07-16 NOTE — Patient Instructions (Signed)
DASH Eating Plan °DASH stands for "Dietary Approaches to Stop Hypertension." The DASH eating plan is a healthy eating plan that has been shown to reduce high blood pressure (hypertension). Additional health benefits may include reducing the risk of type 2 diabetes mellitus, heart disease, and stroke. The DASH eating plan may also help with weight loss. °WHAT DO I NEED TO KNOW ABOUT THE DASH EATING PLAN? °For the DASH eating plan, you will follow these general guidelines: °· Choose foods with a percent daily value for sodium of less than 5% (as listed on the food label). °· Use salt-free seasonings or herbs instead of table salt or sea salt. °· Check with your health care provider or pharmacist before using salt substitutes. °· Eat lower-sodium products, often labeled as "lower sodium" or "no salt added." °· Eat fresh foods. °· Eat more vegetables, fruits, and low-fat dairy products. °· Choose whole grains. Look for the word "whole" as the first word in the ingredient list. °· Choose fish and skinless chicken or turkey more often than red meat. Limit fish, poultry, and meat to 6 oz (170 g) each day. °· Limit sweets, desserts, sugars, and sugary drinks. °· Choose heart-healthy fats. °· Limit cheese to 1 oz (28 g) per day. °· Eat more home-cooked food and less restaurant, buffet, and fast food. °· Limit fried foods. °· Cook foods using methods other than frying. °· Limit canned vegetables. If you do use them, rinse them well to decrease the sodium. °· When eating at a restaurant, ask that your food be prepared with less salt, or no salt if possible. °WHAT FOODS CAN I EAT? °Seek help from a dietitian for individual calorie needs. °Grains °Whole grain or whole wheat bread. Brown rice. Whole grain or whole wheat pasta. Quinoa, bulgur, and whole grain cereals. Low-sodium cereals. Corn or whole wheat flour tortillas. Whole grain cornbread. Whole grain crackers. Low-sodium crackers. °Vegetables °Fresh or frozen vegetables  (raw, steamed, roasted, or grilled). Low-sodium or reduced-sodium tomato and vegetable juices. Low-sodium or reduced-sodium tomato sauce and paste. Low-sodium or reduced-sodium canned vegetables.  °Fruits °All fresh, canned (in natural juice), or frozen fruits. °Meat and Other Protein Products °Ground beef (85% or leaner), grass-fed beef, or beef trimmed of fat. Skinless chicken or turkey. Ground chicken or turkey. Pork trimmed of fat. All fish and seafood. Eggs. Dried beans, peas, or lentils. Unsalted nuts and seeds. Unsalted canned beans. °Dairy °Low-fat dairy products, such as skim or 1% milk, 2% or reduced-fat cheeses, low-fat ricotta or cottage cheese, or plain low-fat yogurt. Low-sodium or reduced-sodium cheeses. °Fats and Oils °Tub margarines without trans fats. Light or reduced-fat mayonnaise and salad dressings (reduced sodium). Avocado. Safflower, olive, or canola oils. Natural peanut or almond butter. °Other °Unsalted popcorn and pretzels. °The items listed above may not be a complete list of recommended foods or beverages. Contact your dietitian for more options. °WHAT FOODS ARE NOT RECOMMENDED? °Grains °White bread. White pasta. White rice. Refined cornbread. Bagels and croissants. Crackers that contain trans fat. °Vegetables °Creamed or fried vegetables. Vegetables in a cheese sauce. Regular canned vegetables. Regular canned tomato sauce and paste. Regular tomato and vegetable juices. °Fruits °Dried fruits. Canned fruit in light or heavy syrup. Fruit juice. °Meat and Other Protein Products °Fatty cuts of meat. Ribs, chicken wings, bacon, sausage, bologna, salami, chitterlings, fatback, hot dogs, bratwurst, and packaged luncheon meats. Salted nuts and seeds. Canned beans with salt. °Dairy °Whole or 2% milk, cream, half-and-half, and cream cheese. Whole-fat or sweetened yogurt. Full-fat   cheeses or blue cheese. Nondairy creamers and whipped toppings. Processed cheese, cheese spreads, or cheese  curds. °Condiments °Onion and garlic salt, seasoned salt, table salt, and sea salt. Canned and packaged gravies. Worcestershire sauce. Tartar sauce. Barbecue sauce. Teriyaki sauce. Soy sauce, including reduced sodium. Steak sauce. Fish sauce. Oyster sauce. Cocktail sauce. Horseradish. Ketchup and mustard. Meat flavorings and tenderizers. Bouillon cubes. Hot sauce. Tabasco sauce. Marinades. Taco seasonings. Relishes. °Fats and Oils °Butter, stick margarine, lard, shortening, ghee, and bacon fat. Coconut, palm kernel, or palm oils. Regular salad dressings. °Other °Pickles and olives. Salted popcorn and pretzels. °The items listed above may not be a complete list of foods and beverages to avoid. Contact your dietitian for more information. °WHERE CAN I FIND MORE INFORMATION? °[employer], [employer], [employer]: www.nhlbi.nih.gov/health/health-topics/topics/dash/ °Document Released: 06/17/2011 Document Revised: 11/12/2013 Document Reviewed: 05/02/2013 °Exit[employer]® Patient Information ©2015 [employer]. This information is not intended to replace advice given to you by your health care provider. Make sure you discuss any questions you have with your health care provider. ° °

## 2014-07-17 ENCOUNTER — Telehealth: Payer: Self-pay | Admitting: *Deleted

## 2014-07-17 DIAGNOSIS — E785 Hyperlipidemia, unspecified: Secondary | ICD-10-CM

## 2014-07-17 MED ORDER — SIMVASTATIN 20 MG PO TABS: 20.0000 mg | ORAL_TABLET | Freq: Every day | ORAL | Status: DC

## 2014-07-17 MED ORDER — SIMVASTATIN 10 MG PO TABS: 20.0000 mg | ORAL_TABLET | Freq: Every day | ORAL | Status: DC

## 2014-07-17 NOTE — Telephone Encounter (Signed)
Pt aware of lab results. Zocor 20 was send to pharmacy

## 2014-07-17 NOTE — Telephone Encounter (Signed)
-----   Message from Lorayne Marek, MD sent at 07/17/2014  9:19 AM EST ----- Blood work reviewed Call and let the patient know that his triglycerides and  LDL (bad cholesterol) has trended up , advise patient for low-fat diet and increase the dose of Zocor to 20 mg, will repeat fasting lipid panel on the next visit.

## 2014-07-18 ENCOUNTER — Telehealth: Payer: Self-pay

## 2014-07-18 ENCOUNTER — Ambulatory Visit: Payer: Self-pay | Attending: Internal Medicine

## 2014-07-18 NOTE — Telephone Encounter (Signed)
-----   Message from Lorayne Marek, MD sent at 07/17/2014  9:19 AM EST ----- Blood work reviewed Call and let the patient know that his triglycerides and  LDL (bad cholesterol) has trended up , advise patient for low-fat diet and increase the dose of Zocor to 20 mg, will repeat fasting lipid panel on the next visit.

## 2014-07-22 ENCOUNTER — Other Ambulatory Visit: Payer: Self-pay

## 2014-07-22 DIAGNOSIS — J45909 Unspecified asthma, uncomplicated: Secondary | ICD-10-CM

## 2014-07-22 MED ORDER — BUDESONIDE-FORMOTEROL FUMARATE 80-4.5 MCG/ACT IN AERO: INHALATION_SPRAY | RESPIRATORY_TRACT | Status: DC

## 2014-08-16 ENCOUNTER — Ambulatory Visit: Payer: Self-pay

## 2014-09-19 ENCOUNTER — Ambulatory Visit: Payer: Self-pay | Attending: Internal Medicine | Admitting: Internal Medicine

## 2014-09-19 ENCOUNTER — Encounter: Payer: Self-pay | Admitting: Internal Medicine

## 2014-09-19 VITALS — BP 116/72 | HR 60 | Temp 97.5°F | Resp 16 | Ht 74.0 in | Wt 234.0 lb

## 2014-09-19 DIAGNOSIS — H538 Other visual disturbances: Secondary | ICD-10-CM | POA: Insufficient documentation

## 2014-09-19 DIAGNOSIS — K029 Dental caries, unspecified: Secondary | ICD-10-CM | POA: Insufficient documentation

## 2014-09-19 DIAGNOSIS — H6123 Impacted cerumen, bilateral: Secondary | ICD-10-CM | POA: Insufficient documentation

## 2014-09-19 DIAGNOSIS — R21 Rash and other nonspecific skin eruption: Secondary | ICD-10-CM | POA: Insufficient documentation

## 2014-09-19 MED ORDER — CARBAMIDE PEROXIDE 6.5 % OT SOLN: 5.0000 [drp] | Freq: Two times a day (BID) | OTIC | Status: DC

## 2014-09-19 MED ORDER — BETAMETHASONE DIPROPIONATE 0.05 % EX OINT: TOPICAL_OINTMENT | Freq: Two times a day (BID) | CUTANEOUS | Status: DC

## 2014-09-19 NOTE — Progress Notes (Signed)
MRN: [id number] Name: [name]  Sex: male Age: 44 y.o. DOB: Jul 15, 1970  Allergies: Review of patient's allergies indicates no known allergies.  Chief Complaint  Patient presents with  . Follow-up  . Rash  . Referral    HPI: Patient is 44 y.o. male who history of hypertension/CHF, asthma, comes today requesting referral to see ophthalmologist and also has lot of dental cavities would like to see a dentist, he also reported to have on and off dry scaly rash which is non-itchy on the dorsal surface of hands elbows and knees, patient has tried over-the-counter cream without much improvement.patient is also complaining of ear fullness denies any fever chills stuffy nose runny nose chest pain shortness of breath.  Past Medical History  Diagnosis Date  . DCM (dilated cardiomyopathy)   . LV dysfunction     with an EF in 2007 at 34%  . HTN (hypertension)   . Dyslipidemia   . Hx of ventricular fibrillation     Hx of VFib arrest s/p AICD placement  . CHF (congestive heart failure)     chronic    Past Surgical History  Procedure Laterality Date  . Repeat echo  2008     demonstrated near normalization  . Transthoracic echocardiogram  11/09/2005, 01/15/2009, 02/07/2010  . Cardiac defibrillator placement        Medication List       This list is accurate as of: 09/19/14  3:52 PM.  Always use your most recent med list.               albuterol 108 (90 BASE) MCG/ACT inhaler  Commonly known as:  PROVENTIL HFA;VENTOLIN HFA  Inhale 1-2 puffs into the lungs every 6 (six) hours as needed for wheezing or shortness of breath.     amiodarone 200 MG tablet  Commonly known as:  PACERONE  Take 1 tablet (200 mg total) by mouth daily.     aspirin 81 MG tablet  Take 81 mg by mouth 2 (two) times daily.     betamethasone dipropionate 0.05 % ointment  Commonly known as:  DIPROLENE  Apply topically 2 (two) times daily.     bisoprolol 5 MG tablet  Commonly known as:  ZEBETA  Take  1 tablet (5 mg total) by mouth daily.     budesonide-formoterol 80-4.5 MCG/ACT inhaler  Commonly known as:  SYMBICORT  Take 2 puffs first thing in am and then another 2 puffs about 12 hours later.     carbamide peroxide 6.5 % otic solution  Commonly known as:  DEBROX  Place 5 drops into both ears 2 (two) times daily.     losartan 50 MG tablet  Commonly known as:  COZAAR  TAKE 1 TABLET BY MOUTH ONCE DAILY     simvastatin 20 MG tablet  Commonly known as:  ZOCOR  Take 1 tablet (20 mg total) by mouth at bedtime.        Meds ordered this encounter  Medications  . DISCONTD: betamethasone dipropionate (DIPROLENE) 0.05 % ointment    Sig: Apply topically 2 (two) times daily.    Dispense:  30 g    Refill:  0  . DISCONTD: carbamide peroxide (DEBROX) 6.5 % otic solution    Sig: Place 5 drops into both ears 2 (two) times daily.    Dispense:  15 mL    Refill:  1  . betamethasone dipropionate (DIPROLENE) 0.05 % ointment    Sig: Apply topically 2 (two) times daily.  Dispense:  30 g    Refill:  0  . carbamide peroxide (DEBROX) 6.5 % otic solution    Sig: Place 5 drops into both ears 2 (two) times daily.    Dispense:  15 mL    Refill:  1    There is no immunization history for the selected administration types on file for this patient.  Family History  Problem Relation Age of Onset  . Asthma Maternal Aunt   . Diabetes Father     History  Substance Use Topics  . Smoking status: Former Smoker -- 0.25 packs/day for 2 years    Types: Cigarettes    Quit date: 07/12/1998  . Smokeless tobacco: Never Used  . Alcohol Use: No     Comment: Denies    Review of Systems   As noted in HPI  Filed Vitals:   09/19/14 1420  BP: 116/72  Pulse: 60  Temp: 97.5 F (36.4 C)  Resp: 16    Physical Exam  Physical Exam  HENT:  Increased wax in both ears  Eyes: EOM are normal. Pupils are equal, round, and reactive to light.  Cardiovascular: Normal rate and regular rhythm.     Pulmonary/Chest: Breath sounds normal. No respiratory distress. He has no wheezes. He has no rales.  Musculoskeletal:  Dry skin/rash on hands elbows and knees    CBC    Component Value Date/Time   WBC 10.0 02/01/2014 0146   RBC 5.81 02/01/2014 0146   HGB 16.8 02/01/2014 0146   HCT 48.6 02/01/2014 0146   PLT 138* 02/01/2014 0146   MCV 83.6 02/01/2014 0146   LYMPHSABS 3.4 02/01/2014 0146   MONOABS 1.2* 02/01/2014 0146   EOSABS 1.5* 02/01/2014 0146   BASOSABS 0.1 02/01/2014 0146    CMP     Component Value Date/Time   NA 142 07/16/2014 1305   K 4.6 07/16/2014 1305   CL 104 07/16/2014 1305   CO2 31 07/16/2014 1305   GLUCOSE 85 07/16/2014 1305   BUN 14 07/16/2014 1305   CREATININE 1.18 07/16/2014 1305   CREATININE 1.10 02/01/2014 0146   CALCIUM 9.6 07/16/2014 1305   PROT 7.6 07/16/2014 1305   ALBUMIN 4.1 07/16/2014 1305   AST 27 07/16/2014 1305   ALT 31 07/16/2014 1305   ALKPHOS 77 07/16/2014 1305   BILITOT 0.7 07/16/2014 1305   GFRNONAA 75 07/16/2014 1305   GFRNONAA 81* 02/01/2014 0146   GFRAA 87 07/16/2014 1305   GFRAA >90 02/01/2014 0146    Lab Results  Component Value Date/Time   CHOL 200 07/16/2014 01:05 PM    No components found for: HGA1C  Lab Results  Component Value Date/Time   AST 27 07/16/2014 01:05 PM    Assessment and Plan  Rash and nonspecific skin eruption/ psoriasis  - Plan: Ambulatory referral to Dermatology, betamethasone dipropionate (DIPROLENE) 0.05 % ointment,   Dental cavities - Plan: Ambulatory referral to Dentistry  Blurry vision - Plan: Ambulatory referral to Ophthalmology  Excess ear wax, bilateral - Plan: carbamide peroxide (DEBROX) 6.5 % otic solution, DISCONTINUED: carbamide peroxide (DEBROX) 6.5 % otic solution    Return in about 3 months (around 12/20/2014).   This note has been created with Surveyor, quantity. Any transcriptional errors are unintentional.    Lorayne Marek,  MD

## 2014-09-19 NOTE — Progress Notes (Signed)
Pt comes in today for evaluation of bilat hand rash that comes and goes for years Denies pain or itch Dry patches seen on hands/knees and elbows Pt tried otc Advanced healing cream but no relief Requesting Opthalmology /dental referral  Flu vaccine needed

## 2014-09-19 NOTE — Patient Instructions (Signed)
DASH Eating Plan °DASH stands for "Dietary Approaches to Stop Hypertension." The DASH eating plan is a healthy eating plan that has been shown to reduce high blood pressure (hypertension). Additional health benefits may include reducing the risk of type 2 diabetes mellitus, heart disease, and stroke. The DASH eating plan may also help with weight loss. °WHAT DO I NEED TO KNOW ABOUT THE DASH EATING PLAN? °For the DASH eating plan, you will follow these general guidelines: °· Choose foods with a percent daily value for sodium of less than 5% (as listed on the food label). °· Use salt-free seasonings or herbs instead of table salt or sea salt. °· Check with your health care provider or pharmacist before using salt substitutes. °· Eat lower-sodium products, often labeled as "lower sodium" or "no salt added." °· Eat fresh foods. °· Eat more vegetables, fruits, and low-fat dairy products. °· Choose whole grains. Look for the word "whole" as the first word in the ingredient list. °· Choose fish and skinless chicken or turkey more often than red meat. Limit fish, poultry, and meat to 6 oz (170 g) each day. °· Limit sweets, desserts, sugars, and sugary drinks. °· Choose heart-healthy fats. °· Limit cheese to 1 oz (28 g) per day. °· Eat more home-cooked food and less restaurant, buffet, and fast food. °· Limit fried foods. °· Cook foods using methods other than frying. °· Limit canned vegetables. If you do use them, rinse them well to decrease the sodium. °· When eating at a restaurant, ask that your food be prepared with less salt, or no salt if possible. °WHAT FOODS CAN I EAT? °Seek help from a dietitian for individual calorie needs. °Grains °Whole grain or whole wheat bread. Brown rice. Whole grain or whole wheat pasta. Quinoa, bulgur, and whole grain cereals. Low-sodium cereals. Corn or whole wheat flour tortillas. Whole grain cornbread. Whole grain crackers. Low-sodium crackers. °Vegetables °Fresh or frozen vegetables  (raw, steamed, roasted, or grilled). Low-sodium or reduced-sodium tomato and vegetable juices. Low-sodium or reduced-sodium tomato sauce and paste. Low-sodium or reduced-sodium canned vegetables.  °Fruits °All fresh, canned (in natural juice), or frozen fruits. °Meat and Other Protein Products °Ground beef (85% or leaner), grass-fed beef, or beef trimmed of fat. Skinless chicken or turkey. Ground chicken or turkey. Pork trimmed of fat. All fish and seafood. Eggs. Dried beans, peas, or lentils. Unsalted nuts and seeds. Unsalted canned beans. °Dairy °Low-fat dairy products, such as skim or 1% milk, 2% or reduced-fat cheeses, low-fat ricotta or cottage cheese, or plain low-fat yogurt. Low-sodium or reduced-sodium cheeses. °Fats and Oils °Tub margarines without trans fats. Light or reduced-fat mayonnaise and salad dressings (reduced sodium). Avocado. Safflower, olive, or canola oils. Natural peanut or almond butter. °Other °Unsalted popcorn and pretzels. °The items listed above may not be a complete list of recommended foods or beverages. Contact your dietitian for more options. °WHAT FOODS ARE NOT RECOMMENDED? °Grains °White bread. White pasta. White rice. Refined cornbread. Bagels and croissants. Crackers that contain trans fat. °Vegetables °Creamed or fried vegetables. Vegetables in a cheese sauce. Regular canned vegetables. Regular canned tomato sauce and paste. Regular tomato and vegetable juices. °Fruits °Dried fruits. Canned fruit in light or heavy syrup. Fruit juice. °Meat and Other Protein Products °Fatty cuts of meat. Ribs, chicken wings, bacon, sausage, bologna, salami, chitterlings, fatback, hot dogs, bratwurst, and packaged luncheon meats. Salted nuts and seeds. Canned beans with salt. °Dairy °Whole or 2% milk, cream, half-and-half, and cream cheese. Whole-fat or sweetened yogurt. Full-fat   cheeses or blue cheese. Nondairy creamers and whipped toppings. Processed cheese, cheese spreads, or cheese  curds. °Condiments °Onion and garlic salt, seasoned salt, table salt, and sea salt. Canned and packaged gravies. Worcestershire sauce. Tartar sauce. Barbecue sauce. Teriyaki sauce. Soy sauce, including reduced sodium. Steak sauce. Fish sauce. Oyster sauce. Cocktail sauce. Horseradish. Ketchup and mustard. Meat flavorings and tenderizers. Bouillon cubes. Hot sauce. Tabasco sauce. Marinades. Taco seasonings. Relishes. °Fats and Oils °Butter, stick margarine, lard, shortening, ghee, and bacon fat. Coconut, palm kernel, or palm oils. Regular salad dressings. °Other °Pickles and olives. Salted popcorn and pretzels. °The items listed above may not be a complete list of foods and beverages to avoid. Contact your dietitian for more information. °WHERE CAN I FIND MORE INFORMATION? °[employer], [employer], [employer]: www.nhlbi.nih.gov/health/health-topics/topics/dash/ °Document Released: 06/17/2011 Document Revised: 11/12/2013 Document Reviewed: 05/02/2013 °Exit[employer]® Patient Information ©2015 [employer]. This information is not intended to replace advice given to you by your health care provider. Make sure you discuss any questions you have with your health care provider. ° °

## 2014-11-08 ENCOUNTER — Telehealth: Payer: Self-pay | Admitting: Internal Medicine

## 2014-11-08 ENCOUNTER — Telehealth: Payer: Self-pay

## 2014-11-08 DIAGNOSIS — I5043 Acute on chronic combined systolic (congestive) and diastolic (congestive) heart failure: Secondary | ICD-10-CM

## 2014-11-08 MED ORDER — LOSARTAN POTASSIUM 50 MG PO TABS: 50.0000 mg | ORAL_TABLET | Freq: Every day | ORAL | Status: DC

## 2014-11-08 NOTE — Telephone Encounter (Signed)
Patient called stating that he had a question in regards to one of his medications, patient hung up the phone before phone call could be transferred to a nurse. Please f/u

## 2014-11-08 NOTE — Telephone Encounter (Signed)
Patient called requesting a refill on his losartan [facility]

## 2014-12-03 ENCOUNTER — Other Ambulatory Visit: Payer: Self-pay

## 2014-12-03 MED ORDER — AMIODARONE HCL 200 MG PO TABS: 200.0000 mg | ORAL_TABLET | Freq: Every day | ORAL | Status: DC

## 2014-12-30 ENCOUNTER — Other Ambulatory Visit: Payer: Self-pay | Admitting: Internal Medicine

## 2015-01-24 ENCOUNTER — Encounter: Payer: Self-pay | Admitting: Internal Medicine

## 2015-01-24 ENCOUNTER — Ambulatory Visit (INDEPENDENT_AMBULATORY_CARE_PROVIDER_SITE_OTHER): Payer: Self-pay | Admitting: Internal Medicine

## 2015-01-24 VITALS — BP 108/70 | HR 57 | Ht 74.0 in | Wt 229.2 lb

## 2015-01-24 DIAGNOSIS — Z95810 Presence of automatic (implantable) cardiac defibrillator: Secondary | ICD-10-CM

## 2015-01-24 DIAGNOSIS — I5043 Acute on chronic combined systolic (congestive) and diastolic (congestive) heart failure: Secondary | ICD-10-CM

## 2015-01-24 DIAGNOSIS — J4531 Mild persistent asthma with (acute) exacerbation: Secondary | ICD-10-CM

## 2015-01-24 DIAGNOSIS — I5022 Chronic systolic (congestive) heart failure: Secondary | ICD-10-CM

## 2015-01-24 DIAGNOSIS — I4901 Ventricular fibrillation: Secondary | ICD-10-CM

## 2015-01-24 MED ORDER — LOSARTAN POTASSIUM 50 MG PO TABS: 25.0000 mg | ORAL_TABLET | Freq: Every day | ORAL | Status: DC

## 2015-01-24 NOTE — Assessment & Plan Note (Signed)
He has class 2 dyspnea. He has no rales or peripheral edema on exam. He is encouraged to reduce his salt intake. Because his blood pressure is a little low, will reduce his dose of  Losartan.

## 2015-01-24 NOTE — Assessment & Plan Note (Signed)
His [employer] device is working normally. Will recheck in several months.  

## 2015-01-24 NOTE — Progress Notes (Signed)
HPI [name] returns today for followup. He is a pleasant 44 yo man with a h/o atrial fibrillation, non-ischemic CM, chronic systolic heart failure, and ?asthma. He has a h/o cough. His heart failure has been well compensated and his ICD has not gone off. He has been off of his amiodarone for about a month. No edema. He has been told that he may have eye problems developing with his amiodarone and he continues to wheeze, raising the question of amio lung toxicity. His dyspnea is at baseline.  No Known Allergies   Current Outpatient Prescriptions  Medication Sig Dispense Refill  . amiodarone (PACERONE) 200 MG tablet Take 1 tablet (200 mg total) by mouth daily. 30 tablet 6  . aspirin 81 MG tablet Take 81 mg by mouth 2 (two) times daily.      Marland Kitchen augmented betamethasone dipropionate (DIPROLENE-AF) 0.05 % ointment Apply 1 application topically 2 (two) times daily as needed (itching).    . bisoprolol (ZEBETA) 5 MG tablet Take 1 tablet (5 mg total) by mouth daily. 90 tablet 3  . budesonide-formoterol (SYMBICORT) 80-4.5 MCG/ACT inhaler Take 2 puffs first thing in am and then another 2 puffs about 12 hours later. (Patient taking differently: Take 2 puffs into the lungs first thing in am and then another 2 puffs about 12 hours later.) 3 Inhaler 3  . carbamide peroxide (DEBROX) 6.5 % otic solution Place 5 drops into both ears 2 (two) times daily. 15 mL 1  . losartan (COZAAR) 50 MG tablet Take 1 tablet (50 mg total) by mouth daily. 30 tablet 3  . albuterol (PROVENTIL HFA;VENTOLIN HFA) 108 (90 BASE) MCG/ACT inhaler Inhale 1-2 puffs into the lungs every 6 (six) hours as needed for wheezing or shortness of breath. (Patient not taking: Reported on 01/24/2015) 1 Inhaler 3   No current facility-administered medications for this visit.     Past Medical History  Diagnosis Date  . DCM (dilated cardiomyopathy)   . LV dysfunction     with an EF in 2007 at 34%  . HTN (hypertension)   . Dyslipidemia   .  Hx of ventricular fibrillation     Hx of VFib arrest s/p AICD placement  . CHF (congestive heart failure)     chronic    ROS:   All systems reviewed and negative except as noted in the HPI.   Past Surgical History  Procedure Laterality Date  . Repeat echo  2008     demonstrated near normalization  . Transthoracic echocardiogram  11/09/2005, 01/15/2009, 02/07/2010  . Cardiac defibrillator placement       Family History  Problem Relation Age of Onset  . Asthma Maternal Aunt   . Diabetes Father      History   Social History  . Marital Status: Married    Spouse Name: N/A  . Number of Children: N/A  . Years of Education: N/A   Occupational History  .     Social History Main Topics  . Smoking status: Former Smoker -- 0.25 packs/day for 2 years    Types: Cigarettes    Quit date: 07/12/1998  . Smokeless tobacco: Never Used  . Alcohol Use: No     Comment: Denies  . Drug Use: No  . Sexual Activity: Not on file   Other Topics Concern  . Not on file   Social History Narrative   The patient is married.  He is in native of [place],     living  in [place] for 9 years.  He has a history of tobacco use,     but quit smoking a year ago.  Denies alcohol abuse.           BP 108/70 mmHg  Pulse 57  Ht 6\' 2"  (1.88 m)  Wt 229 lb 3.2 oz (103.964 kg)  BMI 29.41 kg/m2  Physical Exam:  Well appearing 44 yo man, NAD HEENT: Unremarkable Neck:  6 cm JVD, no thyromegally Back:  No CVA tenderness Lungs:  Clear except for expiratory wheezes, bilaterally. HEART:  Regular rate rhythm, no murmurs, no rubs, no clicks Abd:  soft, positive bowel sounds, no organomegally, no rebound, no guarding Ext:  2 plus pulses, no edema, no cyanosis, no clubbing Skin:  No rashes no nodules Neuro:  CN II through XII intact, motor grossly intact  EKG - NSR  DEVICE  Normal device function.  See PaceArt for details.   Assess/Plan:

## 2015-01-24 NOTE — Assessment & Plan Note (Signed)
He is wheezing today. He does not appear dyspneic. He is instructed to continue his bronchodilators and inhaled steroids.

## 2015-01-24 NOTE — Assessment & Plan Note (Signed)
He has had no recurrent ventricular arrhythmias. Because he has had no VT, will stop amiodarone.

## 2015-01-24 NOTE — Patient Instructions (Addendum)
Remote monitoring is used to monitor your ICD from home. This monitoring reduces the number of office visits required to check your device to one time per year. It allows [place] to keep an eye on the functioning of your device to ensure it is working properly. You are scheduled for a device check from home on 04/28/2015. You may send your transmission at any time that day. If you have a wireless device, the transmission will be sent automatically. After your physician reviews your transmission, you will receive a postcard with your next transmission date.  Your physician recommends that you schedule a follow-up appointment in: 12 months with Dr.Taylor  Your physician has recommended you make the following change in your medication:  1) Stop Amiodarone 2) Decrease Losartan to 25 mg daily

## 2015-01-27 LAB — CUP PACEART INCLINIC DEVICE CHECK
Battery Remaining Longevity: 68.4 mo
HighPow Impedance: 52.0277
Lead Channel Impedance Value: 475 Ohm
Lead Channel Pacing Threshold Amplitude: 0.75 V
Lead Channel Pacing Threshold Pulse Width: 0.4 ms
Lead Channel Pacing Threshold Pulse Width: 0.4 ms
Lead Channel Setting Pacing Pulse Width: 0.4 ms
MDC IDC MSMT LEADCHNL RV PACING THRESHOLD AMPLITUDE: 0.75 V
MDC IDC MSMT LEADCHNL RV SENSING INTR AMPL: 11.7 mV
MDC IDC SESS DTM: 20160715204053
MDC IDC SET LEADCHNL RV PACING AMPLITUDE: 2.5 V
MDC IDC SET LEADCHNL RV SENSING SENSITIVITY: 0.5 mV
MDC IDC STAT BRADY RV PERCENT PACED: 0.01 %
Pulse Gen Serial Number: 728671
Zone Setting Detection Interval: 290 ms

## 2015-02-27 ENCOUNTER — Other Ambulatory Visit: Payer: Self-pay | Admitting: Internal Medicine

## 2015-03-11 ENCOUNTER — Other Ambulatory Visit: Payer: Self-pay | Admitting: *Deleted

## 2015-03-11 ENCOUNTER — Telehealth: Payer: Self-pay | Admitting: Internal Medicine

## 2015-03-11 DIAGNOSIS — I5043 Acute on chronic combined systolic (congestive) and diastolic (congestive) heart failure: Secondary | ICD-10-CM

## 2015-03-11 MED ORDER — LOSARTAN POTASSIUM 50 MG PO TABS: 25.0000 mg | ORAL_TABLET | Freq: Every day | ORAL | Status: DC

## 2015-03-11 NOTE — Telephone Encounter (Signed)
Patient came into clinic requesting medication refill on losartan (COZAAR) 50 MG tablet, please f/u

## 2015-03-14 ENCOUNTER — Ambulatory Visit: Payer: Self-pay | Admitting: Family Medicine

## 2015-03-27 ENCOUNTER — Telehealth: Payer: Self-pay | Admitting: Internal Medicine

## 2015-03-27 NOTE — Telephone Encounter (Signed)
PT CALLING OUT OF [place] WANTS IN CALLED IN IN ONE HOUR TO [facility]-I TOLD HIM WE REQUIRE 48-72 HOUR REQUEST FOR REFILLS, HE SAID HE CALLED LAST TIME HE WAS OUT WE CALLED IT IN IN ONE HOUR-ASKED HIM IN THE FUTURE TO CALL BEFORE HE IS OUT BECAUSE WE CAN'T ALWAYS CALL IT IN SO QUICKLY AS WE MAY HAVE MAY REQUESTS PRIOR TO HIM CALLING

## 2015-03-28 ENCOUNTER — Other Ambulatory Visit: Payer: Self-pay

## 2015-03-28 DIAGNOSIS — I5043 Acute on chronic combined systolic (congestive) and diastolic (congestive) heart failure: Secondary | ICD-10-CM

## 2015-03-28 MED ORDER — LOSARTAN POTASSIUM 50 MG PO TABS: 25.0000 mg | ORAL_TABLET | Freq: Every day | ORAL | Status: DC

## 2015-03-28 NOTE — Telephone Encounter (Signed)
Rx for Losartan sent in today to [facility].

## 2015-04-01 ENCOUNTER — Ambulatory Visit: Payer: Self-pay | Attending: Internal Medicine

## 2015-04-03 ENCOUNTER — Ambulatory Visit: Payer: Self-pay

## 2015-04-07 ENCOUNTER — Ambulatory Visit: Payer: Self-pay | Admitting: Family Medicine

## 2015-04-28 ENCOUNTER — Telehealth: Payer: Self-pay | Admitting: Cardiology

## 2015-04-28 ENCOUNTER — Encounter: Payer: Self-pay | Admitting: *Deleted

## 2015-04-28 NOTE — Telephone Encounter (Signed)
Spoke with pt and reminded pt of remote transmission that is due today. Pt verbalized understanding.   

## 2015-04-29 ENCOUNTER — Encounter: Payer: Self-pay | Admitting: Cardiology

## 2015-06-20 ENCOUNTER — Ambulatory Visit: Payer: Self-pay | Attending: Internal Medicine

## 2015-07-15 MED FILL — LOSARTAN POTASSIUM 25 MG TA: 25 | 30 days supply | Qty: 30 | Fill #2

## 2015-07-28 MED FILL — BISOPROLOL FUMARATE 5 MG TA: 5 | 30 days supply | Qty: 30 | Fill #5

## 2015-08-18 ENCOUNTER — Ambulatory Visit: Payer: Self-pay | Admitting: Family Medicine

## 2015-08-19 MED FILL — LOSARTAN POTASSIUM 25 MG TA: 25 | 30 days supply | Qty: 30 | Fill #3

## 2015-09-01 MED FILL — BISOPROLOL FUMARATE 5 MG TA: 5 | 30 days supply | Qty: 30 | Fill #6

## 2015-09-08 ENCOUNTER — Ambulatory Visit: Payer: Self-pay | Admitting: Family Medicine

## 2015-09-15 MED FILL — LOSARTAN POTASSIUM 25 MG TA: 25 | 30 days supply | Qty: 30 | Fill #4

## 2015-09-19 ENCOUNTER — Other Ambulatory Visit: Payer: Self-pay | Admitting: Internal Medicine

## 2015-09-19 MED FILL — SYMBICORT 80-4.5 MCG INH: 80-4.5 | 30 days supply | Qty: 10 | Fill #0

## 2015-09-22 ENCOUNTER — Encounter: Payer: Self-pay | Admitting: Family Medicine

## 2015-09-22 ENCOUNTER — Ambulatory Visit: Payer: Self-pay | Attending: Family Medicine | Admitting: Family Medicine

## 2015-09-22 VITALS — BP 120/78 | HR 60 | Temp 98.7°F | Resp 16 | Ht 76.0 in | Wt 230.0 lb

## 2015-09-22 DIAGNOSIS — I5043 Acute on chronic combined systolic (congestive) and diastolic (congestive) heart failure: Secondary | ICD-10-CM | POA: Insufficient documentation

## 2015-09-22 DIAGNOSIS — L739 Follicular disorder, unspecified: Secondary | ICD-10-CM

## 2015-09-22 DIAGNOSIS — Z79899 Other long term (current) drug therapy: Secondary | ICD-10-CM | POA: Insufficient documentation

## 2015-09-22 DIAGNOSIS — I5022 Chronic systolic (congestive) heart failure: Secondary | ICD-10-CM

## 2015-09-22 DIAGNOSIS — E785 Hyperlipidemia, unspecified: Secondary | ICD-10-CM

## 2015-09-22 DIAGNOSIS — Z7982 Long term (current) use of aspirin: Secondary | ICD-10-CM | POA: Insufficient documentation

## 2015-09-22 DIAGNOSIS — J45909 Unspecified asthma, uncomplicated: Secondary | ICD-10-CM

## 2015-09-22 DIAGNOSIS — I10 Essential (primary) hypertension: Secondary | ICD-10-CM

## 2015-09-22 DIAGNOSIS — R6881 Early satiety: Secondary | ICD-10-CM | POA: Insufficient documentation

## 2015-09-22 DIAGNOSIS — K5900 Constipation, unspecified: Secondary | ICD-10-CM

## 2015-09-22 DIAGNOSIS — Z87891 Personal history of nicotine dependence: Secondary | ICD-10-CM | POA: Insufficient documentation

## 2015-09-22 LAB — HEMOCCULT GUIAC POC 1CARD (OFFICE): Fecal Occult Blood, POC: NEGATIVE

## 2015-09-22 LAB — COMPLETE METABOLIC PANEL WITH GFR
ALBUMIN: 3.7 g/dL (ref 3.6–5.1)
ALK PHOS: 73 U/L (ref 40–115)
ALT: 18 U/L (ref 9–46)
AST: 16 U/L (ref 10–40)
BILIRUBIN TOTAL: 0.7 mg/dL (ref 0.2–1.2)
BUN: 16 mg/dL (ref 7–25)
CO2: 28 mmol/L (ref 20–31)
CREATININE: 0.98 mg/dL (ref 0.60–1.35)
Calcium: 9.2 mg/dL (ref 8.6–10.3)
Chloride: 105 mmol/L (ref 98–110)
Glucose, Bld: 96 mg/dL (ref 65–99)
Potassium: 4.1 mmol/L (ref 3.5–5.3)
Sodium: 141 mmol/L (ref 135–146)
TOTAL PROTEIN: 6.6 g/dL (ref 6.1–8.1)

## 2015-09-22 LAB — LIPID PANEL
CHOLESTEROL: 130 mg/dL (ref 125–200)
HDL: 21 mg/dL — AB (ref >=40)
LDL Cholesterol: 77 mg/dL (ref <130)
TRIGLYCERIDES: 158 mg/dL — AB (ref <150)
Total CHOL/HDL Ratio: 6.2 Ratio — ABNORMAL HIGH (ref <=5.0)
VLDL: 32 mg/dL — ABNORMAL HIGH (ref <30)

## 2015-09-22 MED ORDER — SULFAMETHOXAZOLE-TRIMETHOPRIM 800-160 MG PO TABS: 1.0000 | ORAL_TABLET | Freq: Two times a day (BID) | ORAL | Status: DC

## 2015-09-22 MED ORDER — ALBUTEROL SULFATE HFA 108 (90 BASE) MCG/ACT IN AERS: 1.0000 | INHALATION_SPRAY | Freq: Four times a day (QID) | RESPIRATORY_TRACT | Status: DC | PRN

## 2015-09-22 MED ORDER — ASPIRIN 81 MG PO TABS: 81.0000 mg | ORAL_TABLET | Freq: Two times a day (BID) | ORAL | Status: AC

## 2015-09-22 MED ORDER — LOSARTAN POTASSIUM 50 MG PO TABS: 25.0000 mg | ORAL_TABLET | Freq: Every day | ORAL | Status: DC

## 2015-09-22 MED ORDER — BUDESONIDE-FORMOTEROL FUMARATE 80-4.5 MCG/ACT IN AERO: 2.0000 | INHALATION_SPRAY | Freq: Two times a day (BID) | RESPIRATORY_TRACT | Status: DC

## 2015-09-22 MED ORDER — POLYETHYLENE GLYCOL 3350 17 GM/SCOOP PO POWD
17.0000 g | Freq: Every day | ORAL | Status: DC | PRN
Start: 2015-09-22 — End: 2016-03-29

## 2015-09-22 MED ORDER — BISOPROLOL FUMARATE 5 MG PO TABS: 5.0000 mg | ORAL_TABLET | Freq: Every day | ORAL | Status: DC

## 2015-09-22 MED FILL — VENTOLIN HFA 90 MCG INHALER: 108 (90 BAS | 28 days supply | Qty: 18 | Fill #0

## 2015-09-22 MED FILL — LOSARTAN POTASSIUM 50 MG TA: 50 | 30 days supply | Qty: 30 | Fill #0

## 2015-09-22 MED FILL — SULFAMETHOXAZOLE/TMP DS TAB: 800-160 | 10 days supply | Qty: 20 | Fill #0

## 2015-09-22 MED FILL — POLYETHYLENE GLYCOL 3350: 30 days supply | Qty: 510 | Fill #0

## 2015-09-22 MED FILL — BISOPROLOL FUMARATE 5 MG TA: 5 | 30 days supply | Qty: 30 | Fill #0

## 2015-09-22 NOTE — Patient Instructions (Signed)
Diagnoses and all orders for this visit:  Hyperlipidemia -     Lipid Panel  Folliculitis -     sulfamethoxazole-trimethoprim (BACTRIM DS,SEPTRA DS) 800-160 MG tablet; Take 1 tablet by mouth 2 (two) times daily.  Essential hypertension -     COMPLETE METABOLIC PANEL WITH GFR    Recommend dietary changes for excessive gas and intolerance of milk  1. Exclude foods that increase flatulence (eg, beans, onions, celery, carrots, raisins, bananas, apricots, prunes, Brussels sprouts, wheat germ, pretzels, and bagels), alcohol, and caffeine.  Have symptoms improved?   If yes, continue to excluded above  If no,  2. Exclude lactose   Have symptoms improved?  If yes, continue to exclude 1 and 2.  If no,  3. Exclude FODMAP Characteristics and sources of common FODMAPs  F Fermentable    O Oligosaccharides Fructans, galacto-oligosaccharides Wheat, barley, rye, onion, leek, white part of spring onion, garlic, shallots, artichokes, beetroot, fennel, peas, chicory, pistachio, cashews, legumes, lentils, and chickpeas  D Disaccharides Lactose Milk, custard, ice cream, and yogurt  M Monosaccharides "Free fructose" (fructose in excess of glucose) Apples, pears, mangoes, cherries, watermelon, asparagus, sugar snap peas, honey, high-fructose corn syrup  A And  P Polyols Sorbitol, mannitol, maltitol, and xylitol Apples, pears, apricots, cherries, nectarines, peaches, plums, watermelon, mushrooms, cauliflower, artificially sweetened chewing gum and confectionery   Be sure to drink plenty of fluids to keep up with water loses from diarrhea  F/u in 6 months  Dr. Adrian Blackwater   Folliculitis Folliculitis is redness, soreness, and swelling (inflammation) of the hair follicles. This condition can occur anywhere on the body. People with weakened immune systems, diabetes, or obesity have a greater risk of getting folliculitis. CAUSES  Bacterial infection. This is the most common cause.  Fungal  infection.  Viral infection.  Contact with certain chemicals, especially oils and tars. Long-term folliculitis can result from bacteria that live in the nostrils. The bacteria may trigger multiple outbreaks of folliculitis over time. SYMPTOMS Folliculitis most commonly occurs on the scalp, thighs, legs, back, buttocks, and areas where hair is shaved frequently. An early sign of folliculitis is a small, white or yellow, pus-filled, itchy lesion (pustule). These lesions appear on a red, inflamed follicle. They are usually less than 0.2 inches (5 mm) wide. When there is an infection of the follicle that goes deeper, it becomes a boil or furuncle. A group of closely packed boils creates a larger lesion (carbuncle). Carbuncles tend to occur in hairy, sweaty areas of the body. DIAGNOSIS  Your caregiver can usually tell what is wrong by doing a physical exam. A sample may be taken from one of the lesions and tested in a lab. This can help determine what is causing your folliculitis. TREATMENT  Treatment may include:  Applying warm compresses to the affected areas.  Taking antibiotic medicines orally or applying them to the skin.  Draining the lesions if they contain a large amount of pus or fluid.  Laser hair removal for cases of long-lasting folliculitis. This helps to prevent regrowth of the hair. HOME CARE INSTRUCTIONS  Apply warm compresses to the affected areas as directed by your caregiver.  If antibiotics are prescribed, take them as directed. Finish them even if you start to feel better.  You may take over-the-counter medicines to relieve itching.  Do not shave irritated skin.  Follow up with your caregiver as directed. SEEK IMMEDIATE MEDICAL CARE IF:   You have increasing redness, swelling, or pain in the affected area.  You have a fever. MAKE SURE YOU:  Understand these instructions.  Will watch your condition.  Will get help right away if you are not doing well or get  worse.   This information is not intended to replace advice given to you by your health care provider. Make sure you discuss any questions you have with your health care provider.   Document Released: 09/06/2001 Document Revised: 07/19/2014 Document Reviewed: 09/28/2011 Elsevier Interactive Patient Education [employer].

## 2015-09-22 NOTE — Progress Notes (Signed)
Subjective:  Patient ID: [name], male    DOB: 22-May-1971  Age: 45 y.o. MRN: [id number]  CC: Hypertension; Constipation; and Groin Swelling   HPI Gemayel Belisle presents for   1. Testicular swelling: x 2 days. R testicle. Getting bigger.  With tenderness. No history of testicular pain or swelling. No pain with ejaculation. No dysuria. No hematuria. Sex with wife only. Married for 13 years. He shaves his pubic hair once month. Last shaved this AM.   2. Early satiety: x 6 months. Associated with small about of weight loss. Some constipation. No diarrhea. No blood in stool. No nausea or vomiting. Excessive gas with very bad smell. No family history of IBS, IBD or colon cancer.   3. HTN: compliant with all meds. No HA, CP, SOB or swelling.   Social History  Substance Use Topics  . Smoking status: Former Smoker -- 0.25 packs/day for 2 years    Types: Cigarettes    Quit date: 07/12/1998  . Smokeless tobacco: Never Used  . Alcohol Use: No     Comment: Denies    Outpatient Prescriptions Prior to Visit  Medication Sig Dispense Refill  . albuterol (PROVENTIL HFA;VENTOLIN HFA) 108 (90 BASE) MCG/ACT inhaler Inhale 1-2 puffs into the lungs every 6 (six) hours as needed for wheezing or shortness of breath. 1 Inhaler 3  . aspirin 81 MG tablet Take 81 mg by mouth 2 (two) times daily.      . bisoprolol (ZEBETA) 5 MG tablet TAKE 1 TABLET BY MOUTH ONCE DAILY 90 tablet 2  . budesonide-formoterol (SYMBICORT) 80-4.5 MCG/ACT inhaler INHALE 2 PUFFS FIRST THING IN THE MORNING AND THEN ANOTHER 2 PUFFS ABOUT 12 HOURS LATER 1 Inhaler 0  . losartan (COZAAR) 50 MG tablet Take 0.5 tablets (25 mg total) by mouth daily. 30 tablet 6  . augmented betamethasone dipropionate (DIPROLENE-AF) 0.05 % ointment Apply 1 application topically 2 (two) times daily as needed (itching). Reported on 09/22/2015    . carbamide peroxide (DEBROX) 6.5 % otic solution Place 5 drops into both ears 2 (two) times daily. (Patient not  taking: Reported on 09/22/2015) 15 mL 1   No facility-administered medications prior to visit.    ROS Review of Systems  Constitutional: Negative for fever, chills, fatigue and unexpected weight change.  Eyes: Negative for visual disturbance.  Respiratory: Negative for cough and shortness of breath.   Cardiovascular: Negative for chest pain, palpitations and leg swelling.  Gastrointestinal: Negative for nausea, vomiting, abdominal pain, diarrhea, constipation and blood in stool.  Endocrine: Negative for polydipsia, polyphagia and polyuria.  Musculoskeletal: Negative for myalgias, back pain, arthralgias, gait problem and neck pain.  Skin: Negative for rash.  Allergic/Immunologic: Negative for immunocompromised state.  Hematological: Negative for adenopathy. Does not bruise/bleed easily.  Psychiatric/Behavioral: Negative for suicidal ideas, sleep disturbance and dysphoric mood. The patient is not nervous/anxious.     Objective:  BP 120/78 mmHg  Pulse 60  Temp(Src) 98.7 F (37.1 C) (Oral)  Resp 16  Ht 6\' 4"  (1.93 m)  Wt 230 lb (104.327 kg)  BMI 28.01 kg/m2  SpO2 99%  BP/Weight 09/22/2015 01/24/2015 [id number]  Systolic BP 123456 [id number]  Diastolic BP 78 70 72  Wt. (Lbs) 230 229.2 234  BMI 28.01 29.41 30.03    Physical Exam  Constitutional: He appears well-developed and well-nourished. No distress.  HENT:  Head: Normocephalic and atraumatic.  Neck: Normal range of motion. Neck supple.  Cardiovascular: Normal rate, regular rhythm, normal heart sounds and intact distal  pulses.   Pulmonary/Chest: Effort normal and breath sounds normal.  Abdominal: Soft. Bowel sounds are normal. He exhibits no distension. There is no tenderness. There is no guarding. Hernia confirmed negative in the right inguinal area and confirmed negative in the left inguinal area.  Genitourinary: Prostate normal. Guaiac negative stool.    Right testis shows tenderness. Right testis shows no mass and no swelling.  Right testis is descended. Cremasteric reflex is not absent on the right side. Left testis shows no mass, no swelling and no tenderness. Left testis is descended. Cremasteric reflex is not absent on the left side. Circumcised.     Musculoskeletal: He exhibits no edema.  Lymphadenopathy:       Left: No inguinal adenopathy present.  Neurological: He is alert.  Skin: Skin is warm and dry. No rash noted. No erythema.  Psychiatric: He has a normal mood and affect.   Lab Results  Component Value Date   HGBA1C 5.8* 09/27/2013    Assessment & Plan:   There are no diagnoses linked to this encounter. Diagnoses and all orders for this visit:  Chronic systolic heart failure (HCC) -     aspirin 81 MG tablet; Take 1 tablet (81 mg total) by mouth 2 (two) times daily. -     bisoprolol (ZEBETA) 5 MG tablet; Take 1 tablet (5 mg total) by mouth daily. -     losartan (COZAAR) 50 MG tablet; Take 0.5 tablets (25 mg total) by mouth daily.  Hyperlipidemia -     Lipid Panel  Folliculitis -     sulfamethoxazole-trimethoprim (BACTRIM DS,SEPTRA DS) 800-160 MG tablet; Take 1 tablet by mouth 2 (two) times daily.  Essential hypertension -     COMPLETE METABOLIC PANEL WITH GFR -     bisoprolol (ZEBETA) 5 MG tablet; Take 1 tablet (5 mg total) by mouth daily. -     losartan (COZAAR) 50 MG tablet; Take 0.5 tablets (25 mg total) by mouth daily.  Asthma, chronic, unspecified asthma severity, uncomplicated -     albuterol (PROVENTIL HFA;VENTOLIN HFA) 108 (90 Base) MCG/ACT inhaler; Inhale 1-2 puffs into the lungs every 6 (six) hours as needed for wheezing or shortness of breath. -     budesonide-formoterol (SYMBICORT) 80-4.5 MCG/ACT inhaler; Inhale 2 puffs into the lungs 2 (two) times daily.  Acute on chronic combined systolic and diastolic heart failure (HCC)  Constipation, unspecified constipation type -     Hemoccult - 1 Card (office)   Meds ordered this encounter  Medications  .  sulfamethoxazole-trimethoprim (BACTRIM DS,SEPTRA DS) 800-160 MG tablet    Sig: Take 1 tablet by mouth 2 (two) times daily.    Dispense:  20 tablet    Refill:  0  . albuterol (PROVENTIL HFA;VENTOLIN HFA) 108 (90 Base) MCG/ACT inhaler    Sig: Inhale 1-2 puffs into the lungs every 6 (six) hours as needed for wheezing or shortness of breath.    Dispense:  1 Inhaler    Refill:  11  . aspirin 81 MG tablet    Sig: Take 1 tablet (81 mg total) by mouth 2 (two) times daily.    Dispense:  30 tablet    Refill:  11  . bisoprolol (ZEBETA) 5 MG tablet    Sig: Take 1 tablet (5 mg total) by mouth daily.    Dispense:  90 tablet    Refill:  3  . budesonide-formoterol (SYMBICORT) 80-4.5 MCG/ACT inhaler    Sig: Inhale 2 puffs into the lungs 2 (two) times  daily.    Dispense:  1 Inhaler    Refill:  11  . losartan (COZAAR) 50 MG tablet    Sig: Take 0.5 tablets (25 mg total) by mouth daily.    Dispense:  30 tablet    Refill:  11    Follow-up: No Follow-up on file.   Boykin Nearing MD

## 2015-09-22 NOTE — Assessment & Plan Note (Signed)
A: HTN, BP well controlled Med: compliant P: Continue current regimen

## 2015-09-22 NOTE — Assessment & Plan Note (Signed)
A; folliculitis on R scrotum P: Bactrim Reassurance Avoid shaving

## 2015-09-22 NOTE — Assessment & Plan Note (Signed)
A: constipation and lactose intolerance suspected culprit for GI upset P: miralax FODMAP diet

## 2015-09-22 NOTE — Progress Notes (Signed)
F/U HTN  Stated testicles swelling and pain No pain with urination, no pain with intercourse   No pain today No suicidal thoughts in the past two weeks

## 2015-09-24 ENCOUNTER — Encounter: Payer: Self-pay | Admitting: Clinical

## 2015-09-24 NOTE — Progress Notes (Signed)
Depression screen [facility] 2/9 09/22/2015 10/11/2013  Decreased Interest 0 0  Down, Depressed, Hopeless 0 0  PHQ - 2 Score 0 0  Altered sleeping 0 -  Tired, decreased energy 3 -  Change in appetite 1 -  Feeling bad or failure about yourself  0 -  Trouble concentrating 0 -  Moving slowly or fidgety/restless 0 -  Suicidal thoughts 0 -  PHQ-9 Score 4 -    GAD 7 : Generalized Anxiety Score 09/22/2015  Nervous, Anxious, on Edge 0  Control/stop worrying 0  Worry too much - different things 0  Trouble relaxing 0  Restless 0  Easily annoyed or irritable 1  Afraid - awful might happen 0  Total GAD 7 Score 1

## 2015-10-31 MED FILL — BISOPROLOL FUMARATE 5 MG TA: 5 | 30 days supply | Qty: 30 | Fill #1

## 2015-10-31 MED FILL — LOSARTAN POTASSIUM 50 MG TA: 50 | 30 days supply | Qty: 30 | Fill #1

## 2015-12-02 MED FILL — BISOPROLOL FUMARATE 5 MG TA: 5 | 30 days supply | Qty: 30 | Fill #2

## 2015-12-31 MED FILL — BISOPROLOL FUMARATE 5 MG TA: 5 | 30 days supply | Qty: 30 | Fill #3

## 2016-01-26 MED FILL — BISOPROLOL FUMARATE 5 MG TA: 5 | 30 days supply | Qty: 30 | Fill #4

## 2016-01-26 MED FILL — LOSARTAN POTASSIUM 50 MG TA: 50 | 30 days supply | Qty: 30 | Fill #2

## 2016-02-23 MED FILL — BISOPROLOL FUMARATE 5 MG TA: 5 | 30 days supply | Qty: 30 | Fill #5

## 2016-03-01 ENCOUNTER — Ambulatory Visit (INDEPENDENT_AMBULATORY_CARE_PROVIDER_SITE_OTHER): Payer: Self-pay | Admitting: *Deleted

## 2016-03-01 ENCOUNTER — Telehealth: Payer: Self-pay | Admitting: Internal Medicine

## 2016-03-01 DIAGNOSIS — I4901 Ventricular fibrillation: Secondary | ICD-10-CM

## 2016-03-01 DIAGNOSIS — Z4502 Encounter for adjustment and management of automatic implantable cardiac defibrillator: Secondary | ICD-10-CM

## 2016-03-01 DIAGNOSIS — Z0389 Encounter for observation for other suspected diseases and conditions ruled out: Secondary | ICD-10-CM

## 2016-03-01 LAB — CUP PACEART INCLINIC DEVICE CHECK
Battery Remaining Longevity: 56.4
Date Time Interrogation Session: 20170821193847
HIGH POWER IMPEDANCE MEASURED VALUE: 43.3335
Implantable Lead Implant Date: 20110805
Implantable Lead Location: 753860
Implantable Lead Serial Number: 39993
Lead Channel Setting Pacing Amplitude: 2.5 V
MDC IDC LEAD MODEL: 185
MDC IDC MSMT LEADCHNL RV IMPEDANCE VALUE: 387.5 Ohm
MDC IDC MSMT LEADCHNL RV SENSING INTR AMPL: 11.7 mV
MDC IDC PG SERIAL: 728671
MDC IDC SET LEADCHNL RV PACING PULSEWIDTH: 0.4 ms
MDC IDC SET LEADCHNL RV SENSING SENSITIVITY: 0.5 mV
MDC IDC STAT BRADY RV PERCENT PACED: 0 %

## 2016-03-01 LAB — BASIC METABOLIC PANEL
BUN: 12 mg/dL (ref 7–25)
CALCIUM: 9.2 mg/dL (ref 8.6–10.3)
CO2: 25 mmol/L (ref 20–31)
CREATININE: 1.16 mg/dL (ref 0.60–1.35)
Chloride: 106 mmol/L (ref 98–110)
Glucose, Bld: 92 mg/dL (ref 65–99)
Potassium: 4.2 mmol/L (ref 3.5–5.3)
SODIUM: 142 mmol/L (ref 135–146)

## 2016-03-01 LAB — MAGNESIUM: MAGNESIUM: 2 mg/dL (ref 1.5–2.5)

## 2016-03-01 LAB — TSH: TSH: 1.12 m[IU]/L (ref 0.40–4.50)

## 2016-03-01 NOTE — Telephone Encounter (Signed)
New message   1. Has your device fired? Per pt yes   2. Is you device beeping? no  3. Are you experiencing draining or swelling at device site? no  4. Are you calling to see if we received your device transmission? no  5. Have you passed out? No

## 2016-03-01 NOTE — Telephone Encounter (Signed)
Called pt back. Pt denies dizziness, sob or chest pain. Advised pt to come into device clinic today tohave device interrogated. nformed pt that he should not drive himself to appointment. Pt voiced understanding.

## 2016-03-03 NOTE — Progress Notes (Signed)
ICD check in clinic for reported shock. Pt received a 30 J shock that successfully terminated VF episode with rate of 375bpm, 13 sec long. Pt reported that he was lying down and felt a sensation of pins and needles in his left arm prior to shock. Afterwards pt stated that he felt a little dizzy, but had no symptoms of chest pain or SOB. Device interrogation and symptoms reviewed with Dr. Rayann Heman. TSH, MG and BMET ordered per JA. Pt stated that he had taken all his medications that day. Pt voiced understanding of 21month driving restriction. ROV with GT 03/29/2016 for yearly recall.

## 2016-03-11 ENCOUNTER — Encounter: Payer: Self-pay | Admitting: Internal Medicine

## 2016-03-11 ENCOUNTER — Ambulatory Visit: Payer: Self-pay | Admitting: Family Medicine

## 2016-03-16 ENCOUNTER — Encounter: Payer: Self-pay | Admitting: Internal Medicine

## 2016-03-19 ENCOUNTER — Telehealth: Payer: Self-pay | Admitting: Cardiology

## 2016-03-19 NOTE — Telephone Encounter (Signed)
LMOVM requesting that pt send manual transmission b/c home monitor has not updated in at least 8 days.   

## 2016-03-24 MED FILL — BISOPROLOL FUMARATE 5 MG TA: 5 | 30 days supply | Qty: 30 | Fill #6

## 2016-03-29 ENCOUNTER — Ambulatory Visit (INDEPENDENT_AMBULATORY_CARE_PROVIDER_SITE_OTHER): Payer: Self-pay | Admitting: Internal Medicine

## 2016-03-29 ENCOUNTER — Encounter: Payer: Self-pay | Admitting: Internal Medicine

## 2016-03-29 DIAGNOSIS — I10 Essential (primary) hypertension: Secondary | ICD-10-CM

## 2016-03-29 DIAGNOSIS — I4901 Ventricular fibrillation: Secondary | ICD-10-CM

## 2016-03-29 DIAGNOSIS — I5022 Chronic systolic (congestive) heart failure: Secondary | ICD-10-CM

## 2016-03-29 LAB — CUP PACEART INCLINIC DEVICE CHECK
Battery Remaining Longevity: 55.2
Brady Statistic RV Percent Paced: 0 %
Date Time Interrogation Session: 20170918132026
HighPow Impedance: 46.0775
Implantable Lead Implant Date: 20110805
Implantable Lead Location: 753860
Implantable Lead Model: 185
Implantable Lead Serial Number: 39993
Lead Channel Pacing Threshold Amplitude: 0.75 V
Lead Channel Pacing Threshold Pulse Width: 0.4 ms
Lead Channel Setting Pacing Pulse Width: 0.4 ms
Lead Channel Setting Sensing Sensitivity: 0.5 mV
MDC IDC MSMT LEADCHNL RV IMPEDANCE VALUE: 387.5 Ohm
MDC IDC MSMT LEADCHNL RV PACING THRESHOLD AMPLITUDE: 0.75 V
MDC IDC MSMT LEADCHNL RV PACING THRESHOLD PULSEWIDTH: 0.4 ms
MDC IDC MSMT LEADCHNL RV SENSING INTR AMPL: 12 mV
MDC IDC PG SERIAL: 728671
MDC IDC SET LEADCHNL RV PACING AMPLITUDE: 2.5 V

## 2016-03-29 MED ORDER — BISOPROLOL FUMARATE 10 MG PO TABS: 10.0000 mg | ORAL_TABLET | Freq: Every day | ORAL | 3 refills | Status: DC

## 2016-03-29 MED FILL — BISOPROLOL FUMARATE 10 MG T: 10 | 30 days supply | Qty: 30 | Fill #0

## 2016-03-29 NOTE — Addendum Note (Signed)
Addended by: Zebedee Iba on: 03/29/2016 03:23 PM   Modules accepted: Orders

## 2016-03-29 NOTE — Progress Notes (Signed)
HPI [name] returns today for followup. He is a pleasant 45 yo man with a h/o atrial fibrillation, non-ischemic CM, chronic systolic heart failure, VTand ?asthma. He has a h/o cough. In the interim, he has had another episode of VT/VF with successful ICD shock. No warning. He notes that his blood pressures have been a little high. Allergies  Allergen Reactions  . Ace Inhibitors Cough     Current Outpatient Prescriptions  Medication Sig Dispense Refill  . aspirin 81 MG tablet Take 1 tablet (81 mg total) by mouth 2 (two) times daily. 30 tablet 11  . bisoprolol (ZEBETA) 10 MG tablet Take 1 tablet (10 mg total) by mouth daily. 90 tablet 3  . budesonide-formoterol (SYMBICORT) 80-4.5 MCG/ACT inhaler Inhale 2 puffs into the lungs 2 (two) times daily. 1 Inhaler 11  . losartan (COZAAR) 50 MG tablet Take 0.5 tablets (25 mg total) by mouth daily. 30 tablet 11   No current facility-administered medications for this visit.      Past Medical History:  Diagnosis Date  . CHF (congestive heart failure) (HCC)    chronic  . DCM (dilated cardiomyopathy) ([place])   . Dyslipidemia   . HTN (hypertension)   . Hx of ventricular fibrillation    Hx of VFib arrest s/p AICD placement  . LV dysfunction    with an EF in 2007 at 34%    ROS:   All systems reviewed and negative except as noted in the HPI.   Past Surgical History:  Procedure Laterality Date  . CARDIAC DEFIBRILLATOR PLACEMENT    . repeat echo  2008    demonstrated near normalization  . TRANSTHORACIC ECHOCARDIOGRAM  11/09/2005, 01/15/2009, 02/07/2010     Family History  Problem Relation Age of Onset  . Diabetes Father   . Heart Problems Father     unsure of what kind  . Asthma Maternal Uncle   . Diabetes Maternal Grandmother      Social History   Social History  . Marital status: Married    Spouse name: N/A  . Number of children: N/A  . Years of education: N/A   Occupational History  .  [employer]   Social History  Main Topics  . Smoking status: Former Smoker    Packs/day: 0.25    Years: 2.00    Types: Cigarettes    Quit date: 07/12/1998  . Smokeless tobacco: Never Used  . Alcohol use No     Comment: Denies  . Drug use: No  . Sexual activity: Not on file   Other Topics Concern  . Not on file   Social History Narrative   The patient is married.  He is in native of [place],     living in [place] for 9 years.  He has a history of tobacco use,     but quit smoking a year ago.  Denies alcohol abuse.           BP (!) 156/92   Pulse 65   Ht 6\' 2"  (1.88 m)   Wt 239 lb (108.4 kg)   BMI 30.69 kg/m   Physical Exam:  Well appearing 45 yo man, NAD HEENT: Unremarkable Neck:  6 cm JVD, no thyromegally Back:  No CVA tenderness Lungs:  Clear except for expiratory wheezes, bilaterally. HEART:  Regular rate rhythm, no murmurs, no rubs, no clicks Abd:  soft, positive bowel sounds, no organomegally, no rebound, no guarding Ext:  2 plus pulses, no edema, no  cyanosis, no clubbing Skin:  No rashes no nodules Neuro:  CN II through XII intact, motor grossly intact  EKG - NSR  DEVICE  Normal device function.  See PaceArt for details.   Assess/Plan: 1. VT/VF - he has had recurrent VT/VF. We discussed the treatment options. For now will increase Zebeta. I have instructed him not to drive. Will hold off on sotalol for now. He had side effects on amio. 2. Chronic systolic heart failure - his symptoms are class 1-2.  3. HTN - his blood pressure is up today and I have asked him to reduce his salt intake and to increase the Zebeta. 4. ICD - his [employer] device is working normally. Will follow.  Mikle Bosworth.D.

## 2016-03-29 NOTE — Patient Instructions (Addendum)
Medication Instructions:    Your physician has recommended you make the following change in your medication:   1) INCREASE Bisoprolol (Zebeta) to 10 mg daily   - a new prescription for 10 mg tablets sent to pharmacy  --- If you need a refill on your cardiac medications before your next appointment, please call your pharmacy. ---  Labwork:  None ordered  Testing/Procedures:  None ordered  Follow-Up:  Remote monitoring is used to monitor your Pacemaker of ICD from home. This monitoring reduces the number of office visits required to check your device to one time per year. It allows [place] to keep an eye on the functioning of your device to ensure it is working properly. You are scheduled for a device check from home on 06/28/2016. You may send your transmission at any time that day. If you have a wireless device, the transmission will be sent automatically. After your physician reviews your transmission, you will receive a postcard with your next transmission date.   Your physician wants you to follow-up in: 1 year with Dr. Lovena Le.  You will receive a reminder letter in the mail two months in advance. If you don't receive a letter, please call our office to schedule the follow-up appointment.  Thank you for choosing [facility]!!

## 2016-04-01 ENCOUNTER — Ambulatory Visit: Payer: Self-pay | Admitting: Family Medicine

## 2016-04-05 NOTE — Addendum Note (Signed)
Addended by: Zebedee Iba on: 04/05/2016 11:16 AM   Modules accepted: Orders

## 2016-04-05 NOTE — Addendum Note (Signed)
Addended by: Zebedee Iba on: 04/05/2016 10:11 AM   Modules accepted: Orders

## 2016-04-12 ENCOUNTER — Encounter: Payer: Self-pay | Admitting: Family Medicine

## 2016-04-12 ENCOUNTER — Ambulatory Visit: Payer: Self-pay | Attending: Family Medicine | Admitting: Family Medicine

## 2016-04-12 VITALS — BP 124/79 | HR 51 | Temp 98.6°F | Ht 74.0 in | Wt 239.4 lb

## 2016-04-12 DIAGNOSIS — Z87891 Personal history of nicotine dependence: Secondary | ICD-10-CM | POA: Insufficient documentation

## 2016-04-12 DIAGNOSIS — I110 Hypertensive heart disease with heart failure: Secondary | ICD-10-CM | POA: Insufficient documentation

## 2016-04-12 DIAGNOSIS — E785 Hyperlipidemia, unspecified: Secondary | ICD-10-CM

## 2016-04-12 DIAGNOSIS — I5022 Chronic systolic (congestive) heart failure: Secondary | ICD-10-CM | POA: Insufficient documentation

## 2016-04-12 DIAGNOSIS — Z7982 Long term (current) use of aspirin: Secondary | ICD-10-CM | POA: Insufficient documentation

## 2016-04-12 DIAGNOSIS — Z79899 Other long term (current) drug therapy: Secondary | ICD-10-CM | POA: Insufficient documentation

## 2016-04-12 DIAGNOSIS — Z95810 Presence of automatic (implantable) cardiac defibrillator: Secondary | ICD-10-CM | POA: Insufficient documentation

## 2016-04-12 DIAGNOSIS — Z23 Encounter for immunization: Secondary | ICD-10-CM

## 2016-04-12 DIAGNOSIS — Z114 Encounter for screening for human immunodeficiency virus [HIV]: Secondary | ICD-10-CM

## 2016-04-12 DIAGNOSIS — I10 Essential (primary) hypertension: Secondary | ICD-10-CM

## 2016-04-12 DIAGNOSIS — Z7951 Long term (current) use of inhaled steroids: Secondary | ICD-10-CM | POA: Insufficient documentation

## 2016-04-12 LAB — LIPID PANEL
CHOL/HDL RATIO: 8 ratio — AB (ref <=5.0)
CHOLESTEROL: 159 mg/dL (ref 125–200)
HDL: 20 mg/dL — ABNORMAL LOW (ref >=40)
LDL Cholesterol: 100 mg/dL (ref <130)
TRIGLYCERIDES: 193 mg/dL — AB (ref <150)
VLDL: 39 mg/dL — AB (ref <30)

## 2016-04-12 LAB — HEPATIC FUNCTION PANEL
ALT: 23 U/L (ref 9–46)
AST: 21 U/L (ref 10–40)
Albumin: 4 g/dL (ref 3.6–5.1)
Alkaline Phosphatase: 81 U/L (ref 40–115)
BILIRUBIN DIRECT: 0.1 mg/dL (ref <=0.2)
BILIRUBIN TOTAL: 0.6 mg/dL (ref 0.2–1.2)
Indirect Bilirubin: 0.5 mg/dL (ref 0.2–1.2)
Total Protein: 6.8 g/dL (ref 6.1–8.1)

## 2016-04-12 NOTE — Progress Notes (Signed)
Subjective:  Patient ID: [name], male    DOB: August 09, 1970  Age: 45 y.o. MRN: [id number]  CC: Hypertension   HPI [name] has HTN, chronic systolic CHF, ICD in place, VT, hx of A fib,  [name]  presents for   1. CHRONIC HYPERTENSION  Disease Monitoring  Blood pressure range: not checking   Chest pain: no   Dyspnea: no   Claudication: no   Medication compliance: yes  Medication Side Effects  Lightheadedness: yes, has episode of lightheadedness following by ICD shock a few weeks ago. [name] has since seen his electrophysiologist, no recurrent episodes. [name] reports that this episode occurred in the setting of stay awake for most of the day.    Urinary frequency: no   Edema: no     2. HLD: normal lipids in 09/2015. Request check of lipids. Previously on simvastatin 10-20 mg daily.   Social History  Substance Use Topics  . Smoking status: Former Smoker    Packs/day: 0.25    Years: 2.00    Types: Cigarettes    Quit date: 07/12/1998  . Smokeless tobacco: Never Used  . Alcohol use No     Comment: Denies    Outpatient Medications Prior to Visit  Medication Sig Dispense Refill  . aspirin 81 MG tablet Take 1 tablet (81 mg total) by mouth 2 (two) times daily. 30 tablet 11  . bisoprolol (ZEBETA) 10 MG tablet Take 1 tablet (10 mg total) by mouth daily. 90 tablet 3  . budesonide-formoterol (SYMBICORT) 80-4.5 MCG/ACT inhaler Inhale 2 puffs into the lungs 2 (two) times daily. 1 Inhaler 11  . losartan (COZAAR) 50 MG tablet Take 0.5 tablets (25 mg total) by mouth daily. 30 tablet 11   No facility-administered medications prior to visit.     ROS Review of Systems  Constitutional: Negative for chills, fatigue, fever and unexpected weight change.  Eyes: Negative for visual disturbance.  Respiratory: Negative for cough and shortness of breath.   Cardiovascular: Negative for chest pain, palpitations and leg swelling.  Gastrointestinal: Negative for abdominal pain, blood in stool,  constipation, diarrhea, nausea and vomiting.  Endocrine: Negative for polydipsia, polyphagia and polyuria.  Musculoskeletal: Negative for arthralgias, back pain, gait problem, myalgias and neck pain.  Skin: Negative for rash.  Allergic/Immunologic: Negative for immunocompromised state.  Hematological: Negative for adenopathy. Does not bruise/bleed easily.  Psychiatric/Behavioral: Negative for dysphoric mood, sleep disturbance and suicidal ideas. The patient is not nervous/anxious.     Objective:  BP 124/79 (BP Location: Left Arm, Patient Position: Sitting, Cuff Size: Small)   Pulse (!) 51   Temp 98.6 F (37 C) (Oral)   Ht 6\' 2"  (1.88 m)   Wt 239 lb 6.4 oz (108.6 kg)   SpO2 97%   BMI 30.74 kg/m   BP/Weight 04/12/2016 03/29/2016 0000000  Systolic BP A999333 [id number]  Diastolic BP 79 92 78  Wt. (Lbs) 239.4 239 230  BMI 30.74 30.69 28.01   Wt Readings from Last 3 Encounters:  04/12/16 239 lb 6.4 oz (108.6 kg)  03/29/16 239 lb (108.4 kg)  09/22/15 230 lb (104.3 kg)   Physical Exam  Constitutional: [name] appears well-developed and well-nourished. No distress.  HENT:  Head: Normocephalic and atraumatic.  Neck: Normal range of motion. Neck supple.  Cardiovascular: Normal rate, regular rhythm, normal heart sounds and intact distal pulses.   Pulmonary/Chest: Effort normal and breath sounds normal.  Musculoskeletal: [name] exhibits no edema.  Neurological: [name] is alert.  Skin: Skin is warm  and dry. No rash noted. No erythema.  Psychiatric: [name] has a normal mood and affect.    Assessment & Plan:  [name] was seen today for hypertension.  Diagnoses and all orders for this visit:  Hyperlipidemia, unspecified hyperlipidemia type -     Hepatic Function Panel -     Lipid Panel  Screening for HIV (human immunodeficiency virus) -     HIV antibody (with reflex)  Essential hypertension   There are no diagnoses linked to this encounter.  No orders of the defined types were placed in this  encounter.   Follow-up: Return in about 3 months (around 07/13/2016) for HTN .   Boykin Nearing MD

## 2016-04-12 NOTE — Assessment & Plan Note (Signed)
Well-controlled.  Continue current regimen. 

## 2016-04-12 NOTE — Progress Notes (Signed)
Pt is getting flu shot today. 

## 2016-04-12 NOTE — Assessment & Plan Note (Signed)
Checking lipids today Will determine of statin needed

## 2016-04-12 NOTE — Patient Instructions (Addendum)
Lamar was seen today for hypertension.  Diagnoses and all orders for this visit:  Hyperlipidemia, unspecified hyperlipidemia type -     Hepatic Function Panel -     Lipid Panel  Screening for HIV (human immunodeficiency virus) -     HIV antibody (with reflex)  Essential hypertension   F/u in 3 months for HTN   Dr. Adrian Blackwater

## 2016-04-13 ENCOUNTER — Other Ambulatory Visit: Payer: Self-pay | Admitting: *Deleted

## 2016-04-13 DIAGNOSIS — I5022 Chronic systolic (congestive) heart failure: Secondary | ICD-10-CM

## 2016-04-13 DIAGNOSIS — I10 Essential (primary) hypertension: Secondary | ICD-10-CM

## 2016-04-13 LAB — HIV ANTIBODY (ROUTINE TESTING W REFLEX): HIV 1&2 Ab, 4th Generation: NONREACTIVE

## 2016-04-13 MED ORDER — LOSARTAN POTASSIUM 50 MG PO TABS: 25.0000 mg | ORAL_TABLET | Freq: Every day | ORAL | 3 refills | Status: DC

## 2016-04-13 MED FILL — LOSARTAN POTASSIUM 50 MG TA: 50 | 30 days supply | Qty: 45 | Fill #0

## 2016-04-14 NOTE — Addendum Note (Signed)
Addended by: Zebedee Iba on: 04/14/2016 09:48 AM   Modules accepted: Orders

## 2016-05-03 MED FILL — BISOPROLOL FUMARATE 10 MG T: 10 | 30 days supply | Qty: 30 | Fill #1

## 2016-05-10 MED FILL — SYMBICORT 80-4.5 MCG INH: 80-4.5 | 27 days supply | Qty: 10 | Fill #0

## 2016-05-31 MED FILL — BISOPROLOL FUMARATE 10 MG T: 10 | 30 days supply | Qty: 30 | Fill #2

## 2016-06-24 ENCOUNTER — Ambulatory Visit (INDEPENDENT_AMBULATORY_CARE_PROVIDER_SITE_OTHER): Payer: Self-pay | Admitting: *Deleted

## 2016-06-24 ENCOUNTER — Telehealth: Payer: Self-pay | Admitting: Cardiology

## 2016-06-24 DIAGNOSIS — I4901 Ventricular fibrillation: Secondary | ICD-10-CM

## 2016-06-24 NOTE — Telephone Encounter (Signed)
Spoke with pt and reminded pt of remote transmission that is due today. Pt verbalized understanding.   

## 2016-06-24 NOTE — Progress Notes (Signed)
Remote ICD transmission.   

## 2016-06-30 ENCOUNTER — Encounter: Payer: Self-pay | Admitting: Cardiology

## 2016-06-30 MED FILL — BISOPROLOL FUMARATE 10 MG T: 10 | 30 days supply | Qty: 30 | Fill #3

## 2016-07-08 MED FILL — LOSARTAN POTASSIUM 50 MG TA: 50 | 30 days supply | Qty: 45 | Fill #1

## 2016-07-16 LAB — CUP PACEART REMOTE DEVICE CHECK
Battery Remaining Longevity: 52 mo
Battery Remaining Percentage: 44 %
Date Time Interrogation Session: 20171214140654
HighPow Impedance: 49 Ohm
Implantable Lead Location: 753860
Implantable Lead Model: 185
Implantable Pulse Generator Implant Date: 20110805
Lead Channel Setting Pacing Amplitude: 2.5 V
Lead Channel Setting Pacing Pulse Width: 0.4 ms
Lead Channel Setting Sensing Sensitivity: 0.5 mV
MDC IDC LEAD IMPLANT DT: 20110805
MDC IDC LEAD SERIAL: 39993
MDC IDC MSMT BATTERY VOLTAGE: 2.93 V
MDC IDC MSMT LEADCHNL RV IMPEDANCE VALUE: 410 Ohm
MDC IDC MSMT LEADCHNL RV PACING THRESHOLD AMPLITUDE: 0.75 V
MDC IDC MSMT LEADCHNL RV PACING THRESHOLD PULSEWIDTH: 0.4 ms
MDC IDC MSMT LEADCHNL RV SENSING INTR AMPL: 11.7 mV
MDC IDC PG SERIAL: 728671
MDC IDC STAT BRADY RV PERCENT PACED: 1 %

## 2016-08-02 MED FILL — BISOPROLOL FUMARATE 10 MG T: 10 | 30 days supply | Qty: 30 | Fill #4

## 2016-08-26 MED FILL — BISOPROLOL FUMARATE 10 MG T: 10 | 30 days supply | Qty: 30 | Fill #5

## 2016-09-03 ENCOUNTER — Telehealth: Payer: Self-pay | Admitting: Cardiology

## 2016-09-03 NOTE — Telephone Encounter (Signed)
Attempted to call pt b/c his home monitor has not updated in the last 7 days. No answer and unable to leave a message.   

## 2016-09-13 ENCOUNTER — Telehealth: Payer: Self-pay | Admitting: Internal Medicine

## 2016-09-13 NOTE — Telephone Encounter (Signed)
Walk In pt form- [employer]-paper dropped off by patient placed on [employer].

## 2016-09-14 ENCOUNTER — Telehealth: Payer: Self-pay

## 2016-09-15 NOTE — Telephone Encounter (Signed)
Called, pt unavailable. Left voice message form is complete and available for pick up at our office. Dr. Lovena Le stated "ok to use respirator at work".

## 2016-09-23 ENCOUNTER — Ambulatory Visit (INDEPENDENT_AMBULATORY_CARE_PROVIDER_SITE_OTHER): Payer: Self-pay | Admitting: *Deleted

## 2016-09-23 ENCOUNTER — Telehealth: Payer: Self-pay | Admitting: Cardiology

## 2016-09-23 DIAGNOSIS — I4901 Ventricular fibrillation: Secondary | ICD-10-CM

## 2016-09-23 MED FILL — LOSARTAN POTASSIUM 50 MG TA: 50 | 30 days supply | Qty: 45 | Fill #2

## 2016-09-23 NOTE — Telephone Encounter (Signed)
LMOVM reminding pt to send remote transmission.   

## 2016-09-23 NOTE — Progress Notes (Signed)
Remote ICD transmission.   

## 2016-09-24 ENCOUNTER — Encounter: Payer: Self-pay | Admitting: Cardiology

## 2016-09-24 NOTE — Telephone Encounter (Signed)
Found patient's letter. Called patient to let him know it is at the front desk under his last name. Patient verbalized understanding.

## 2016-09-24 NOTE — Telephone Encounter (Signed)
Pt called to follow up on collecting the paper work.  It was not waiting up front for him RN Nira Conn helped me look. Nothing.   Please give pt a call to collect letter when ready.    Thank you!

## 2016-09-27 LAB — CUP PACEART REMOTE DEVICE CHECK
Battery Remaining Percentage: 42 %
Battery Voltage: 2.92 V
HighPow Impedance: 50 Ohm
Implantable Lead Location: 753860
Implantable Lead Serial Number: 39993
Implantable Pulse Generator Implant Date: 20110805
Lead Channel Sensing Intrinsic Amplitude: 11.7 mV
Lead Channel Setting Pacing Amplitude: 2.5 V
Lead Channel Setting Pacing Pulse Width: 0.4 ms
Lead Channel Setting Sensing Sensitivity: 0.5 mV
MDC IDC LEAD IMPLANT DT: 20110805
MDC IDC MSMT BATTERY REMAINING LONGEVITY: 50 mo
MDC IDC MSMT LEADCHNL RV IMPEDANCE VALUE: 400 Ohm
MDC IDC MSMT LEADCHNL RV PACING THRESHOLD AMPLITUDE: 0.75 V
MDC IDC MSMT LEADCHNL RV PACING THRESHOLD PULSEWIDTH: 0.4 ms
MDC IDC SESS DTM: 20180315154252
MDC IDC STAT BRADY RV PERCENT PACED: 1 %
Pulse Gen Serial Number: 728671

## 2016-10-01 MED FILL — BISOPROLOL FUMARATE 10 MG T: 10 | 30 days supply | Qty: 30 | Fill #6

## 2016-10-08 ENCOUNTER — Encounter: Payer: Self-pay | Admitting: Cardiology

## 2016-10-30 ENCOUNTER — Encounter (HOSPITAL_COMMUNITY): Payer: Self-pay | Admitting: Emergency Medicine

## 2016-10-30 ENCOUNTER — Ambulatory Visit (HOSPITAL_COMMUNITY)
Admission: EM | Admit: 2016-10-30 | Discharge: 2016-10-30 | Disposition: A | Discharge status: Home / Self Care | Payer: BLUE CROSS/BLUE SHIELD | Attending: Family Medicine | Admitting: Family Medicine

## 2016-10-30 DIAGNOSIS — K640 First degree hemorrhoids: Secondary | ICD-10-CM

## 2016-10-30 DIAGNOSIS — H6504 Acute serous otitis media, recurrent, right ear: Secondary | ICD-10-CM

## 2016-10-30 DIAGNOSIS — M7742 Metatarsalgia, left foot: Secondary | ICD-10-CM

## 2016-10-30 DIAGNOSIS — J301 Allergic rhinitis due to pollen: Secondary | ICD-10-CM

## 2016-10-30 MED ORDER — AMOXICILLIN 875 MG PO TABS: 875.0000 mg | ORAL_TABLET | Freq: Two times a day (BID) | ORAL | 0 refills | Status: DC

## 2016-10-30 MED ORDER — HYDROCORTISONE 2.5 % RE CREA: TOPICAL_CREAM | RECTAL | 3 refills | Status: DC

## 2016-10-30 MED ORDER — PREDNISONE 20 MG PO TABS: ORAL_TABLET | ORAL | 0 refills | Status: DC

## 2016-10-30 NOTE — Discharge Instructions (Signed)
You need to buy a "cookie" or shoe insert for your boots at the pharmacy.  Ask the pharmacist for help with your metatarsalgia.

## 2016-10-30 NOTE — ED Triage Notes (Signed)
Pt has been suffering from some nasal congestion due to allergies.  He has been treating it with OTC medications.  Three days ago he developed right ear pain that has not been relieved by the medications.

## 2016-10-30 NOTE — ED Provider Notes (Signed)
[facility]    CSN: [id number] Arrival date & time: 10/30/16  1804     History   Chief Complaint Chief Complaint  Patient presents with  . Otalgia    right    HPI [name] is a 46 y.o. male.   Pt has been suffering from some nasal congestion due to allergies.  He has been treating it with OTC medications.  Three days ago he developed right ear pain that has not been relieved by the medications.  Patient suffers from seasonal allergies every year at this time.  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  His is from [place]  He also complains of chronic hemorrhoid problem and would like a refill of his hemorrhoid cream.  He also complains of left foot pain when he puts on his work boots.  The pain is at the head of the metatarsals.      Past Medical History:  Diagnosis Date  . CHF (congestive heart failure) (HCC)    chronic  . DCM (dilated cardiomyopathy) ([place])   . Dyslipidemia   . HTN (hypertension)   . Hx of ventricular fibrillation    Hx of VFib arrest s/p AICD placement  . LV dysfunction    with an EF in 2007 at 34%    Patient Active Problem List   Diagnosis Date Noted  . HTN (hypertension) 09/22/2015  . Constipation 09/22/2015  . Folliculitis 01/60/1093  . Asthma, intrinsic with exacerbation 02/04/2014  . Ventricular fibrillation ([place]) 11/09/2013  . Environmental allergies 10/11/2013  . IFG (impaired fasting glucose) 10/11/2013  . Hyperlipidemia 10/11/2013  . Chronic systolic heart failure ([place]) 05/26/2010  . Automatic implantable cardioverter-defibrillator in situ 05/26/2010    Past Surgical History:  Procedure Laterality Date  . CARDIAC DEFIBRILLATOR PLACEMENT    . repeat echo  2008    demonstrated near normalization  . TRANSTHORACIC ECHOCARDIOGRAM  11/09/2005, 01/15/2009, 02/07/2010       Home Medications    Prior to Admission medications   Medication Sig Start Date End Date Taking? Authorizing Provider  aspirin 81  MG tablet Take 1 tablet (81 mg total) by mouth 2 (two) times daily. 09/22/15  Yes Josalyn Funches, MD  bisoprolol (ZEBETA) 10 MG tablet Take 1 tablet (10 mg total) by mouth daily. 03/29/16  Yes Evans Lance, MD  budesonide-formoterol [facility]) 80-4.5 MCG/ACT inhaler Inhale 2 puffs into the lungs 2 (two) times daily. 09/22/15  Yes Josalyn Funches, MD  losartan (COZAAR) 50 MG tablet Take 0.5 tablets (25 mg total) by mouth daily. 04/13/16  Yes Evans Lance, MD  amoxicillin (AMOXIL) 875 MG tablet Take 1 tablet (875 mg total) by mouth 2 (two) times daily. 10/30/16   Robyn Haber, MD  hydrocortisone (ANUSOL-HC) 2.5 % rectal cream Apply rectally 2 times daily 10/30/16   Robyn Haber, MD  predniSONE (DELTASONE) 20 MG tablet Two daily with food 10/30/16   Robyn Haber, MD    Family History Family History  Problem Relation Age of Onset  . Diabetes Father   . Heart Problems Father     unsure of what kind  . Asthma Maternal Uncle   . Diabetes Maternal Grandmother     Social History Social History  Substance Use Topics  . Smoking status: Former Smoker    Packs/day: 0.25    Years: 2.00    Types: Cigarettes    Quit date: 07/12/1998  . Smokeless tobacco: Never Used  . Alcohol use No     Comment:  Denies     Allergies   Ace inhibitors   Review of Systems Review of Systems  Constitutional: Negative.   HENT: Positive for congestion, ear pain, hearing loss, sinus pressure and sneezing.   Respiratory: Negative for cough.   Gastrointestinal: Negative.   Musculoskeletal: Positive for arthralgias and gait problem.  Neurological: Negative for dizziness and facial asymmetry.  Psychiatric/Behavioral: Negative.      Physical Exam Triage Vital Signs ED Triage Vitals  Enc Vitals Group     BP 10/30/16 1824 123/72     Pulse Rate 10/30/16 1824 64     Resp --      Temp 10/30/16 1824 98.3 F (36.8 C)     Temp Source 10/30/16 1824 Oral     SpO2 10/30/16 1824 98 %     Weight --       Height --      Head Circumference --      Peak Flow --      Pain Score 10/30/16 1825 4     Pain Loc --      Pain Edu? --      Excl. in [place]? --    No data found.   Updated Vital Signs BP 123/72 (BP Location: Right Arm)   Pulse 64   Temp 98.3 F (36.8 C) (Oral)   SpO2 98%    Physical Exam  Constitutional: He is oriented to person, place, and time. He appears well-developed and well-nourished.  HENT:  Head: Normocephalic.  Right Ear: External ear normal.  Left Ear: External ear normal.  Mouth/Throat: Oropharynx is clear and moist.  Right TM erythema  Marked nasal passage swelling  Eyes: Conjunctivae and EOM are normal. Pupils are equal, round, and reactive to light.  Neck: Normal range of motion. Neck supple.  Cardiovascular: Normal rate.   Pulmonary/Chest: Effort normal and breath sounds normal.  Musculoskeletal: Normal range of motion. He exhibits tenderness. He exhibits no edema or deformity.  Tender left foot metatarsals.  Neurological: He is alert and oriented to person, place, and time.  Skin: Skin is warm and dry.  Nursing note and vitals reviewed.    UC Treatments / Results  Labs (all labs ordered are listed, but only abnormal results are displayed) Labs Reviewed - No data to display  EKG  EKG Interpretation None       Radiology No results found.  Procedures Procedures (including critical care time)  Medications Ordered in UC Medications - No data to display   Initial Impression / Assessment and Plan / UC Course  I have reviewed the triage vital signs and the nursing notes.  Pertinent labs & imaging results that were available during my care of the patient were reviewed by me and considered in my medical decision making (see chart for details).     Final Clinical Impressions(s) / UC Diagnoses   Final diagnoses:  Recurrent acute serous otitis media of right ear  Seasonal allergic rhinitis due to pollen  Grade I hemorrhoids  Metatarsalgia  of left foot    New Prescriptions New Prescriptions   AMOXICILLIN (AMOXIL) 875 MG TABLET    Take 1 tablet (875 mg total) by mouth 2 (two) times daily.   HYDROCORTISONE (ANUSOL-HC) 2.5 % RECTAL CREAM    Apply rectally 2 times daily   PREDNISONE (DELTASONE) 20 MG TABLET    Two daily with food     Robyn Haber, MD 10/30/16 1901

## 2016-11-01 ENCOUNTER — Other Ambulatory Visit: Payer: Self-pay | Admitting: Family Medicine

## 2016-11-01 DIAGNOSIS — J45909 Unspecified asthma, uncomplicated: Secondary | ICD-10-CM

## 2016-11-01 MED FILL — BISOPROLOL FUMARATE 10 MG T: 10 | 30 days supply | Qty: 30 | Fill #7

## 2016-11-01 MED FILL — SYMBICORT 80-4.5 MCG INH: 80-4.5 | 30 days supply | Qty: 10 | Fill #0

## 2016-11-02 MED FILL — !VENTOLIN HFA INHALER: 108 (90 BAS | 25 days supply | Qty: 18 | Fill #0

## 2016-11-11 ENCOUNTER — Ambulatory Visit: Payer: BLUE CROSS/BLUE SHIELD | Attending: Family Medicine

## 2016-11-24 ENCOUNTER — Encounter: Payer: Self-pay | Admitting: Family Medicine

## 2016-11-26 ENCOUNTER — Other Ambulatory Visit: Payer: Self-pay | Admitting: Family Medicine

## 2016-11-26 DIAGNOSIS — J45909 Unspecified asthma, uncomplicated: Secondary | ICD-10-CM

## 2016-11-26 MED FILL — SYMBICORT 80-4.5 MCG INH: 80-4.5 | 30 days supply | Qty: 10 | Fill #1

## 2016-11-26 MED FILL — !VENTOLIN HFA INHALER: 108 (90 BAS | 25 days supply | Qty: 18 | Fill #1

## 2016-11-29 MED FILL — !VENTOLIN HFA INHALER: 108 (90 BAS | 25 days supply | Qty: 18 | Fill #0

## 2016-12-02 ENCOUNTER — Other Ambulatory Visit: Payer: Self-pay

## 2016-12-02 DIAGNOSIS — J45909 Unspecified asthma, uncomplicated: Secondary | ICD-10-CM

## 2016-12-02 MED ORDER — FLUTICASONE FUROATE-VILANTEROL 100-25 MCG/INH IN AEPB: 2.0000 | INHALATION_SPRAY | Freq: Every day | RESPIRATORY_TRACT | 3 refills | Status: DC

## 2016-12-02 MED ORDER — ALBUTEROL SULFATE HFA 108 (90 BASE) MCG/ACT IN AERS: INHALATION_SPRAY | RESPIRATORY_TRACT | 3 refills | Status: DC

## 2016-12-02 MED FILL — BISOPROLOL FUMARATE 10 MG T: 10 | 30 days supply | Qty: 30 | Fill #8

## 2016-12-23 ENCOUNTER — Encounter: Payer: BLUE CROSS/BLUE SHIELD | Admitting: *Deleted

## 2016-12-23 ENCOUNTER — Telehealth: Payer: Self-pay | Admitting: Cardiology

## 2016-12-23 NOTE — Telephone Encounter (Signed)
LMOVM reminding pt to send remote transmission.   

## 2016-12-24 ENCOUNTER — Encounter: Payer: Self-pay | Admitting: Cardiology

## 2016-12-28 ENCOUNTER — Ambulatory Visit (INDEPENDENT_AMBULATORY_CARE_PROVIDER_SITE_OTHER): Payer: BLUE CROSS/BLUE SHIELD | Admitting: *Deleted

## 2016-12-28 DIAGNOSIS — I4901 Ventricular fibrillation: Secondary | ICD-10-CM

## 2016-12-29 ENCOUNTER — Other Ambulatory Visit: Payer: Self-pay | Admitting: Internal Medicine

## 2016-12-29 DIAGNOSIS — I10 Essential (primary) hypertension: Secondary | ICD-10-CM

## 2016-12-29 DIAGNOSIS — I5022 Chronic systolic (congestive) heart failure: Secondary | ICD-10-CM

## 2016-12-29 MED FILL — LOSARTAN POTASSIUM 50 MG TA: 50 | 30 days supply | Qty: 45 | Fill #3

## 2016-12-29 MED FILL — BISOPROLOL FUMARATE 10 MG T: 10 | 30 days supply | Qty: 30 | Fill #9

## 2016-12-30 ENCOUNTER — Telehealth: Payer: Self-pay | Admitting: Internal Medicine

## 2016-12-30 NOTE — Telephone Encounter (Signed)
New message    Pt is calling about a letter he received about missing his home check appt. He would like to talk to nurse.

## 2016-12-30 NOTE — Telephone Encounter (Signed)
Pt made aware of transmission received 12/28/16, next transmission due 03/29/17.  He asked about the battery longevity- 4 years remaining.

## 2016-12-30 NOTE — Progress Notes (Signed)
Remote ICD transmission.   

## 2016-12-31 ENCOUNTER — Encounter: Payer: Self-pay | Admitting: Cardiology

## 2017-01-04 LAB — CUP PACEART REMOTE DEVICE CHECK
Battery Voltage: 2.92 V
HighPow Impedance: 48 Ohm
Implantable Lead Implant Date: 20110805
Implantable Lead Location: 753860
Implantable Lead Serial Number: 39993
Implantable Pulse Generator Implant Date: 20110805
Lead Channel Pacing Threshold Amplitude: 0.75 V
Lead Channel Sensing Intrinsic Amplitude: 11.7 mV
Lead Channel Setting Pacing Pulse Width: 0.4 ms
Lead Channel Setting Sensing Sensitivity: 0.5 mV
MDC IDC MSMT BATTERY REMAINING LONGEVITY: 48 mo
MDC IDC MSMT BATTERY REMAINING PERCENTAGE: 40 %
MDC IDC MSMT LEADCHNL RV IMPEDANCE VALUE: 400 Ohm
MDC IDC MSMT LEADCHNL RV PACING THRESHOLD PULSEWIDTH: 0.4 ms
MDC IDC SESS DTM: 20180619165830
MDC IDC SET LEADCHNL RV PACING AMPLITUDE: 2.5 V
MDC IDC STAT BRADY RV PERCENT PACED: 1 %
Pulse Gen Serial Number: 728671

## 2017-01-14 ENCOUNTER — Encounter: Payer: Self-pay | Admitting: Cardiology

## 2017-01-31 ENCOUNTER — Ambulatory Visit: Payer: BLUE CROSS/BLUE SHIELD | Admitting: Family Medicine

## 2017-01-31 MED FILL — BISOPROLOL FUMARATE 10 MG T: 10 | 30 days supply | Qty: 30 | Fill #10

## 2017-01-31 MED FILL — SYMBICORT 80-4.5 MCG INH: 80-4.5 | 30 days supply | Qty: 10 | Fill #2

## 2017-02-10 ENCOUNTER — Encounter: Payer: Self-pay | Admitting: Family Medicine

## 2017-02-10 ENCOUNTER — Ambulatory Visit: Payer: BLUE CROSS/BLUE SHIELD | Attending: Family Medicine | Admitting: Family Medicine

## 2017-02-10 VITALS — BP 108/65 | HR 55 | Temp 98.2°F | Ht 74.0 in | Wt 236.8 lb

## 2017-02-10 DIAGNOSIS — Z7951 Long term (current) use of inhaled steroids: Secondary | ICD-10-CM | POA: Diagnosis not present

## 2017-02-10 DIAGNOSIS — Z7982 Long term (current) use of aspirin: Secondary | ICD-10-CM | POA: Diagnosis not present

## 2017-02-10 DIAGNOSIS — M25571 Pain in right ankle and joints of right foot: Secondary | ICD-10-CM | POA: Insufficient documentation

## 2017-02-10 DIAGNOSIS — Z79899 Other long term (current) drug therapy: Secondary | ICD-10-CM | POA: Diagnosis not present

## 2017-02-10 DIAGNOSIS — L309 Dermatitis, unspecified: Secondary | ICD-10-CM | POA: Diagnosis not present

## 2017-02-10 DIAGNOSIS — R21 Rash and other nonspecific skin eruption: Secondary | ICD-10-CM | POA: Diagnosis not present

## 2017-02-10 DIAGNOSIS — M79671 Pain in right foot: Secondary | ICD-10-CM | POA: Insufficient documentation

## 2017-02-10 DIAGNOSIS — Z87891 Personal history of nicotine dependence: Secondary | ICD-10-CM | POA: Insufficient documentation

## 2017-02-10 DIAGNOSIS — J4521 Mild intermittent asthma with (acute) exacerbation: Secondary | ICD-10-CM

## 2017-02-10 DIAGNOSIS — Z95810 Presence of automatic (implantable) cardiac defibrillator: Secondary | ICD-10-CM | POA: Insufficient documentation

## 2017-02-10 DIAGNOSIS — I4901 Ventricular fibrillation: Secondary | ICD-10-CM | POA: Diagnosis not present

## 2017-02-10 DIAGNOSIS — J45901 Unspecified asthma with (acute) exacerbation: Secondary | ICD-10-CM | POA: Diagnosis not present

## 2017-02-10 DIAGNOSIS — J4520 Mild intermittent asthma, uncomplicated: Secondary | ICD-10-CM

## 2017-02-10 DIAGNOSIS — I5022 Chronic systolic (congestive) heart failure: Secondary | ICD-10-CM | POA: Insufficient documentation

## 2017-02-10 DIAGNOSIS — N200 Calculus of kidney: Secondary | ICD-10-CM | POA: Insufficient documentation

## 2017-02-10 DIAGNOSIS — J45909 Unspecified asthma, uncomplicated: Secondary | ICD-10-CM | POA: Diagnosis not present

## 2017-02-10 DIAGNOSIS — I110 Hypertensive heart disease with heart failure: Secondary | ICD-10-CM | POA: Diagnosis present

## 2017-02-10 DIAGNOSIS — I10 Essential (primary) hypertension: Secondary | ICD-10-CM

## 2017-02-10 LAB — POCT URINALYSIS DIPSTICK
Bilirubin, UA: NEGATIVE
Blood, UA: NEGATIVE
GLUCOSE UA: NEGATIVE
Ketones, UA: NEGATIVE
Leukocytes, UA: NEGATIVE
NITRITE UA: NEGATIVE
Protein, UA: 100
Spec Grav, UA: 1.025 (ref 1.010–1.025)
UROBILINOGEN UA: 0.2 U/dL
pH, UA: 5 (ref 5.0–8.0)

## 2017-02-10 MED ORDER — ALBUTEROL SULFATE HFA 108 (90 BASE) MCG/ACT IN AERS: INHALATION_SPRAY | RESPIRATORY_TRACT | 3 refills | Status: DC

## 2017-02-10 MED ORDER — BUDESONIDE-FORMOTEROL FUMARATE 80-4.5 MCG/ACT IN AERO: 2.0000 | INHALATION_SPRAY | Freq: Two times a day (BID) | RESPIRATORY_TRACT | 11 refills | Status: DC

## 2017-02-10 NOTE — Progress Notes (Signed)
Subjective:  Patient ID: [name], male    DOB: 07-Jun-1971  Age: 46 y.o. MRN: [id number]  CC: Hypertension   HPI [name] has HTN, chronic systolic CHF, ICD in place, VT, hx of A fib,  he  presents for   1. CHRONIC HYPERTENSION  Disease Monitoring  Blood pressure range: not checking   Chest pain: no   Dyspnea: no   Claudication: no   Medication compliance: yes  Medication Side Effects  Lightheadedness: no  Urinary frequency: no   Edema: no    2. R foot pain: x one week. No swelling no trauma. 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 Also has chronic rash with plaques on hands that have been biopsied in the past. He treats with Eucerin.   3. Asthma: not using Symbicort or albuterol. Reports symptoms are only at in the winter.   Social History  Substance Use Topics  . Smoking status: Former Smoker    Packs/day: 0.25    Years: 2.00    Types: Cigarettes    Quit date: 07/12/1998  . Smokeless tobacco: Never Used  . Alcohol use No     Comment: Denies    Outpatient Medications Prior to Visit  Medication Sig Dispense Refill  . albuterol (VENTOLIN HFA) 108 (90 Base) MCG/ACT inhaler INHALE 1-2 PUFFS INTO THE LUNGS EVERY 6 HOURS AS NEEDED FOR WHEEZING OR SHORTNESS OF BREATH. 54 g 3  . amoxicillin (AMOXIL) 875 MG tablet Take 1 tablet (875 mg total) by mouth 2 (two) times daily. 20 tablet 0  . aspirin 81 MG tablet Take 1 tablet (81 mg total) by mouth 2 (two) times daily. 30 tablet 11  . bisoprolol (ZEBETA) 10 MG tablet Take 1 tablet (10 mg total) by mouth daily. 90 tablet 3  . fluticasone furoate-vilanterol (BREO ELLIPTA) 100-25 MCG/INH AEPB Inhale 2 puffs into the lungs daily. 180 each 3  . hydrocortisone (ANUSOL-HC) 2.5 % rectal cream Apply rectally 2 times daily 28 g 3  . losartan (COZAAR) 50 MG tablet TAKE 1/2 TABLET BY MOUTH DAILY. 45 tablet 3  . predniSONE (DELTASONE) 20 MG tablet Two daily with food 10 tablet 0  . SYMBICORT 80-4.5 MCG/ACT inhaler INHALE  2 PUFFS INTO THE LUNGS 2 (TWO) TIMES DAILY. 10.2 g 11   No facility-administered medications prior to visit.     ROS Review of Systems  Constitutional: Negative for chills, fatigue, fever and unexpected weight change.  Eyes: Negative for visual disturbance.  Respiratory: Negative for cough and shortness of breath.   Cardiovascular: Negative for chest pain, palpitations and leg swelling.  Gastrointestinal: Negative for abdominal pain, blood in stool, constipation, diarrhea, nausea and vomiting.  Endocrine: Negative for polydipsia, polyphagia and polyuria.  Musculoskeletal: Positive for arthralgias. Negative for back pain, gait problem, myalgias and neck pain.  Skin: Positive for rash.  Allergic/Immunologic: Negative for immunocompromised state.  Hematological: Negative for adenopathy. Does not bruise/bleed easily.  Psychiatric/Behavioral: Negative for dysphoric mood, sleep disturbance and suicidal ideas. The patient is not nervous/anxious.     Objective:  BP 108/65   Pulse (!) 55   Temp 98.2 F (36.8 C) (Oral)   Ht '6\' 2"'$  (1.88 m)   Wt 236 lb 12.8 oz (107.4 kg)   SpO2 98%   BMI 30.40 kg/m   BP/Weight 02/10/2017 10/30/2016 35/09/2990  Systolic BP 426 834 196  Diastolic BP 65 72 79  Wt. (Lbs) 236.8 - 239.4  BMI 30.4 - 30.74   Wt Readings  from Last 3 Encounters:  04/12/16 239 lb 6.4 oz (108.6 kg)  03/29/16 239 lb (108.4 kg)  09/22/15 230 lb (104.3 kg)   Pulse Readings from Last 3 Encounters:  02/10/17 (!) 55  10/30/16 64  04/12/16 (!) 51    Physical Exam  Constitutional: He appears well-developed and well-nourished. No distress.  HENT:  Head: Normocephalic and atraumatic.  Neck: Normal range of motion. Neck supple.  Cardiovascular: Normal rate, regular rhythm, normal heart sounds and intact distal pulses.   Pulmonary/Chest: Effort normal and breath sounds normal.  Musculoskeletal: He exhibits no edema.  Neurological: He is alert.  Skin: Skin is warm and dry. No rash  noted. No erythema.     Psychiatric: He has a normal mood and affect.   UA: 100 protein, otherwise negative  Assessment & Plan:  [name] was seen today for hypertension.  Diagnoses and all orders for this visit:  Mild intermittent intrinsic asthma with exacerbation  Chronic systolic heart failure (HCC) -     CMP14+EGFR -     Lipid Panel  Automatic implantable cardioverter-defibrillator in situ  Ventricular fibrillation (HCC)  Arthralgia of right foot -     Uric Acid -     RA Qn+CCP(IgG/A)+SjoSSA+SjoSSB  Dermatitis -     Uric Acid -     RA Qn+CCP(IgG/A)+SjoSSA+SjoSSB  Nephrolithiasis -     POCT urinalysis dipstick  Asthma, chronic, unspecified asthma severity, uncomplicated -     budesonide-formoterol (SYMBICORT) 80-4.5 MCG/ACT inhaler; Inhale 2 puffs into the lungs 2 (two) times daily. During winter to prevent asthma flares -     albuterol (VENTOLIN HFA) 108 (90 Base) MCG/ACT inhaler; INHALE 1-2 PUFFS INTO THE LUNGS EVERY 6 HOURS AS NEEDED FOR WHEEZING OR SHORTNESS OF BREATH.   There are no diagnoses linked to this encounter.  No orders of the defined types were placed in this encounter.   Follow-up: Return in about 3 months (around 05/13/2017) for asthma .   Boykin Nearing MD

## 2017-02-10 NOTE — Assessment & Plan Note (Signed)
Hand hands with joints pains in feet Has some feature of psoriasis but patient reports rash has been biopsied before Will check uric acid and rheumatoid factor

## 2017-02-10 NOTE — Patient Instructions (Addendum)
[name] was seen today for hypertension.  Diagnoses and all orders for this visit:  Mild intermittent intrinsic asthma with exacerbation  Chronic systolic heart failure (HCC) -     CMP14+EGFR -     Lipid Panel  Automatic implantable cardioverter-defibrillator in situ  Ventricular fibrillation (HCC)  Arthralgia of right foot -     Uric Acid -     RA Qn+CCP(IgG/A)+SjoSSA+SjoSSB  Dermatitis -     Uric Acid -     RA Qn+CCP(IgG/A)+SjoSSA+SjoSSB  Nephrolithiasis -     POCT urinalysis dipstick  Asthma, chronic, unspecified asthma severity, uncomplicated -     budesonide-formoterol (SYMBICORT) 80-4.5 MCG/ACT inhaler; Inhale 2 puffs into the lungs 2 (two) times daily. During winter to prevent asthma flares -     albuterol (VENTOLIN HFA) 108 (90 Base) MCG/ACT inhaler; INHALE 1-2 PUFFS INTO THE LUNGS EVERY 6 HOURS AS NEEDED FOR WHEEZING OR SHORTNESS OF BREATH.   On second thought continue bisoprolol at current dose. Only decrease it to 5 mg if you develop dizziness, lightheadedness or fatigue   Return for flu shot in one month. This can be done at a nurse visit   F/u in 3 months sooner if needed for asthma   Dr. Adrian Blackwater

## 2017-02-10 NOTE — Assessment & Plan Note (Signed)
Continue Symbicort and prn albuterol during winter months

## 2017-02-10 NOTE — Assessment & Plan Note (Signed)
Normal blood pressure and heart rate Continue bisoprolol 10 mg and losartan 25 mg daily  He has cardiology follow up next month

## 2017-02-12 LAB — RA QN+CCP(IGG/A)+SJOSSA+SJOSSB
Cyclic Citrullin Peptide Ab: 6 units (ref 0–19)
ENA SSB (LA) Ab: <0.2 AI (ref 0.0–0.9)

## 2017-02-12 LAB — LIPID PANEL
CHOLESTEROL TOTAL: 138 mg/dL (ref 100–199)
Chol/HDL Ratio: 6.3 ratio — ABNORMAL HIGH (ref 0.0–5.0)
HDL: 22 mg/dL — ABNORMAL LOW (ref >39)
LDL Calculated: 74 mg/dL (ref 0–99)
Triglycerides: 211 mg/dL — ABNORMAL HIGH (ref 0–149)
VLDL Cholesterol Cal: 42 mg/dL — ABNORMAL HIGH (ref 5–40)

## 2017-02-12 LAB — CMP14+EGFR
A/G RATIO: 1.6 (ref 1.2–2.2)
ALBUMIN: 4.1 g/dL (ref 3.5–5.5)
ALK PHOS: 67 IU/L (ref 39–117)
ALT: 22 IU/L (ref 0–44)
AST: 21 IU/L (ref 0–40)
BILIRUBIN TOTAL: 0.6 mg/dL (ref 0.0–1.2)
BUN / CREAT RATIO: 13 (ref 9–20)
BUN: 14 mg/dL (ref 6–24)
CHLORIDE: 107 mmol/L — AB (ref 96–106)
CO2: 19 mmol/L — ABNORMAL LOW (ref 20–29)
Calcium: 9.3 mg/dL (ref 8.7–10.2)
Creatinine, Ser: 1.1 mg/dL (ref 0.76–1.27)
GFR calc non Af Amer: 81 mL/min/{1.73_m2} (ref >59)
GFR, EST AFRICAN AMERICAN: 93 mL/min/{1.73_m2} (ref >59)
GLUCOSE: 106 mg/dL — AB (ref 65–99)
Globulin, Total: 2.6 g/dL (ref 1.5–4.5)
POTASSIUM: 4.2 mmol/L (ref 3.5–5.2)
Sodium: 144 mmol/L (ref 134–144)
TOTAL PROTEIN: 6.7 g/dL (ref 6.0–8.5)

## 2017-02-12 LAB — URIC ACID: Uric Acid: 8.6 mg/dL (ref 3.7–8.6)

## 2017-02-14 ENCOUNTER — Telehealth: Payer: Self-pay

## 2017-02-14 NOTE — Telephone Encounter (Signed)
Pt was called and informed of lab results. 

## 2017-02-28 MED FILL — BISOPROLOL FUMARATE 10 MG T: 10 | 30 days supply | Qty: 30 | Fill #11

## 2017-03-29 ENCOUNTER — Other Ambulatory Visit: Payer: Self-pay | Admitting: Internal Medicine

## 2017-03-29 ENCOUNTER — Ambulatory Visit (INDEPENDENT_AMBULATORY_CARE_PROVIDER_SITE_OTHER): Payer: BLUE CROSS/BLUE SHIELD | Admitting: *Deleted

## 2017-03-29 DIAGNOSIS — I4901 Ventricular fibrillation: Secondary | ICD-10-CM

## 2017-03-29 DIAGNOSIS — I5022 Chronic systolic (congestive) heart failure: Secondary | ICD-10-CM

## 2017-03-29 DIAGNOSIS — I10 Essential (primary) hypertension: Secondary | ICD-10-CM

## 2017-03-29 MED FILL — LOSARTAN POTASSIUM 50 MG TA: 50 | 30 days supply | Qty: 15 | Fill #0

## 2017-03-29 NOTE — Progress Notes (Signed)
Remote ICD transmission.   

## 2017-03-30 MED FILL — BISOPROLOL FUMARATE 10 MG T: 10 | 30 days supply | Qty: 30 | Fill #0

## 2017-03-31 ENCOUNTER — Encounter: Payer: Self-pay | Admitting: Cardiology

## 2017-03-31 LAB — CUP PACEART REMOTE DEVICE CHECK
Brady Statistic RV Percent Paced: 1 %
HighPow Impedance: 48 Ohm
Implantable Lead Implant Date: 20110805
Implantable Lead Serial Number: 39993
Lead Channel Impedance Value: 400 Ohm
Lead Channel Pacing Threshold Amplitude: 0.75 V
Lead Channel Pacing Threshold Pulse Width: 0.4 ms
Lead Channel Sensing Intrinsic Amplitude: 11.7 mV
Lead Channel Setting Pacing Pulse Width: 0.4 ms
MDC IDC LEAD LOCATION: 753860
MDC IDC MSMT BATTERY REMAINING LONGEVITY: 44 mo
MDC IDC MSMT BATTERY REMAINING PERCENTAGE: 38 %
MDC IDC MSMT BATTERY VOLTAGE: 2.9 V
MDC IDC PG IMPLANT DT: 20110805
MDC IDC PG SERIAL: 728671
MDC IDC SESS DTM: 20180918124547
MDC IDC SET LEADCHNL RV PACING AMPLITUDE: 2.5 V
MDC IDC SET LEADCHNL RV SENSING SENSITIVITY: 0.5 mV

## 2017-04-04 ENCOUNTER — Telehealth: Payer: Self-pay | Admitting: Internal Medicine

## 2017-04-04 ENCOUNTER — Telehealth: Payer: Self-pay | Admitting: Cardiology

## 2017-04-04 NOTE — Telephone Encounter (Signed)
Patient called and stated that he received therapies from his ICD. I instructed pt to send a manual transmission w/ his home monitor. Patient stated that he feels fine now.

## 2017-04-04 NOTE — Telephone Encounter (Signed)
New message    Pt is calling stating his device shocked him this morning.

## 2017-04-04 NOTE — Telephone Encounter (Signed)
Spoke with pt informed him that Dr. Lovena Le recommended that if he gets a shock between now and his OV on 9/25 at 2:00 pm then he is to got to the ED pt voiced understanding. Pt aware of driving restrictions.

## 2017-04-05 ENCOUNTER — Encounter: Payer: Self-pay | Admitting: Internal Medicine

## 2017-04-05 ENCOUNTER — Ambulatory Visit (INDEPENDENT_AMBULATORY_CARE_PROVIDER_SITE_OTHER): Payer: BLUE CROSS/BLUE SHIELD | Admitting: Internal Medicine

## 2017-04-05 ENCOUNTER — Other Ambulatory Visit: Payer: Self-pay | Admitting: Internal Medicine

## 2017-04-05 VITALS — BP 114/78 | HR 54 | Ht 74.0 in | Wt 235.6 lb

## 2017-04-05 DIAGNOSIS — I10 Essential (primary) hypertension: Secondary | ICD-10-CM

## 2017-04-05 DIAGNOSIS — I5022 Chronic systolic (congestive) heart failure: Secondary | ICD-10-CM | POA: Diagnosis not present

## 2017-04-05 DIAGNOSIS — I4901 Ventricular fibrillation: Secondary | ICD-10-CM | POA: Diagnosis not present

## 2017-04-05 LAB — CUP PACEART INCLINIC DEVICE CHECK
Brady Statistic RV Percent Paced: 1 % — CL
Implantable Lead Implant Date: 20110805
Implantable Lead Location: 753860
Lead Channel Sensing Intrinsic Amplitude: 11.7 mV
Lead Channel Setting Sensing Sensitivity: 0.5 mV
MDC IDC LEAD SERIAL: 39993
MDC IDC MSMT LEADCHNL RV PACING THRESHOLD AMPLITUDE: 0.75 V
MDC IDC MSMT LEADCHNL RV PACING THRESHOLD PULSEWIDTH: 0.4 ms
MDC IDC PG IMPLANT DT: 20110805
MDC IDC SESS DTM: 20180925145206
MDC IDC SET LEADCHNL RV PACING AMPLITUDE: 2.5 V
MDC IDC SET LEADCHNL RV PACING PULSEWIDTH: 0.4 ms
Pulse Gen Serial Number: 728671

## 2017-04-05 MED ORDER — AMIODARONE HCL 200 MG PO TABS: 200.0000 mg | ORAL_TABLET | Freq: Every day | ORAL | 3 refills | Status: DC

## 2017-04-05 MED FILL — AMIODARONE HCL 200 MG TAB: 200 | 30 days supply | Qty: 30 | Fill #0

## 2017-04-05 NOTE — Progress Notes (Signed)
HPI [name] returns today for ongoing evaluation and management of ventricular tachycardia and fibrillation, chronic systolic heart failure, status post ICD implantation. He is a very pleasant 46 year old man with the above problems. He also has hypertension. In the interim, he has had a couple of ICD shocks. He felt like he was falling into a deep hole and then was shocked and then felt better. He denies anginal symptoms. His heart failure symptoms have not worsened. He denies medical or dietary noncompliance. Allergies  Allergen Reactions  . Ace Inhibitors Cough     Current Outpatient Prescriptions  Medication Sig Dispense Refill  . albuterol (VENTOLIN HFA) 108 (90 Base) MCG/ACT inhaler INHALE 1-2 PUFFS INTO THE LUNGS EVERY 6 HOURS AS NEEDED FOR WHEEZING OR SHORTNESS OF BREATH. 54 g 3  . aspirin 81 MG tablet Take 1 tablet (81 mg total) by mouth 2 (two) times daily. 30 tablet 11  . bisoprolol (ZEBETA) 10 MG tablet TAKE 1 TABLET BY MOUTH DAILY. 30 tablet 0  . budesonide-formoterol (SYMBICORT) 80-4.5 MCG/ACT inhaler Inhale 2 puffs into the lungs 2 (two) times daily. During winter to prevent asthma flares 10.2 g 11  . losartan (COZAAR) 50 MG tablet TAKE 1/2 TABLET BY MOUTH DAILY. 45 tablet 3   No current facility-administered medications for this visit.      Past Medical History:  Diagnosis Date  . CHF (congestive heart failure) (HCC)    chronic  . DCM (dilated cardiomyopathy) ([place])   . Dyslipidemia   . HTN (hypertension)   . Hx of ventricular fibrillation    Hx of VFib arrest s/p AICD placement  . LV dysfunction    with an EF in 2007 at 34%    ROS:   All systems reviewed and negative except as noted in the HPI.   Past Surgical History:  Procedure Laterality Date  . CARDIAC DEFIBRILLATOR PLACEMENT    . repeat echo  2008    demonstrated near normalization  . TRANSTHORACIC ECHOCARDIOGRAM  11/09/2005, 01/15/2009, 02/07/2010     Family History  Problem Relation  Age of Onset  . Diabetes Father   . Heart Problems Father        unsure of what kind  . Asthma Maternal Uncle   . Diabetes Maternal Grandmother      Social History   Social History  . Marital status: Married    Spouse name: N/A  . Number of children: N/A  . Years of education: N/A   Occupational History  .  [employer]   Social History Main Topics  . Smoking status: Former Smoker    Packs/day: 0.25    Years: 2.00    Types: Cigarettes    Quit date: 07/12/1998  . Smokeless tobacco: Never Used  . Alcohol use No     Comment: Denies  . Drug use: No  . Sexual activity: Not on file   Other Topics Concern  . Not on file   Social History Narrative   The patient is married.  He is in native of [place],     living in [place] for 9 years.  He has a history of tobacco use,     but quit smoking a year ago.  Denies alcohol abuse.           BP 114/78   Pulse (!) 54   Ht 6\' 2"  (1.88 m)   Wt 235 lb 9.6 oz (106.9 kg)   SpO2 97%   BMI 30.25 kg/m  Physical Exam:  Well appearing 46 year old man, NAD HEENT: Unremarkable Neck:  8 cm JVD, no thyromegally Lymphatics:  No adenopathy Back:  No CVA tenderness Lungs:  Clear, with no wheezes, rales, or rhonchi HEART:  Regular rate rhythm, no murmurs, no rubs, no clicks, soft S3 is present, PMI is enlarged and laterally displaced Abd:  soft, positive bowel sounds, no organomegally, no rebound, no guarding Ext:  2 plus pulses, no edema, no cyanosis, no clubbing Skin:  No rashes no nodules Neuro:  CN II through XII intact, motor grossly intact  EKG - sinus bradycardia with left atrial enlargement  DEVICE  Normal device function.  See PaceArt for details. 2 episodes of ventricular fibrillation are noted  Assess/Plan: 1. Ventricular fibrillation - he has had 2 episodes in the last few days. Each episode was terminated with an ICD shock as his rates were over 250 bpm. Polymorphic. He denies medical or dietary noncompliance. I've asked  the patient to get some labs today to make sure that his potassium and magnesium levels are okay. I will start him on amiodarone 200 mg daily. He will return in 3 months for additional valuation and labs. 2. Chronic systolic heart failure -his heart failure symptoms are class II and he appears well compensated. He is encouraged to maintain a low-sodium diet. 3. ICD - his St. Jude single-chamber ICD is working normally. We'll plan to recheck in several months.  Cristopher Peru, M.D.

## 2017-04-05 NOTE — Patient Instructions (Addendum)
Medication Instructions:  Your physician has recommended you make the following change in your medication:  1.  Start taking amiodarone 200 mg (one tablet) by mouth daily.    Labwork: You will need to get a liver panel, TSH and BMP.  These should be scheduled in about 3 months, prior to your return to see Dr. Lovena Le.  You do NOT need to be fasting.    You will get a BMP and magnesium level drawn today.  Testing/Procedures: None ordered.  Follow-Up: Your physician wants you to follow-up in: 3 months with Dr. Lovena Le.    Remote monitoring is used to monitor your ICD from home. This monitoring reduces the number of office visits required to check your device to one time per year. It allows [place] to keep an eye on the functioning of your device to ensure it is working properly. You are scheduled for a device check from home on 06/28/2017. You may send your transmission at any time that day. If you have a wireless device, the transmission will be sent automatically. After your physician reviews your transmission, you will receive a postcard with your next transmission date.    Any Other Special Instructions Will Be Listed Below (If Applicable).     If you need a refill on your cardiac medications before your next appointment, please call your pharmacy.

## 2017-04-06 LAB — MAGNESIUM: MAGNESIUM: 2.5 mg/dL — AB (ref 1.6–2.3)

## 2017-04-14 ENCOUNTER — Telehealth: Payer: Self-pay

## 2017-04-14 NOTE — Telephone Encounter (Signed)
Notes recorded by Damian Leavell, RN on 04/13/2017 at 4:56 PM EDT Patient called. Notified Pt that Dr. Lovena Le would prefer Pt continue taking losartan. However, if Pt was symptomatic with low blood pressure and he felt better not taking losartan than ok for Pt to stop taking. Per Pt, he felt "bad" when his blood pressure got low. States he feels better now that he has stopped losartan. Notified Pt to call office if he has any further issues. We will continue to monitor Pt closely. ------  Notes recorded by Damian Leavell, RN on 04/12/2017 at 5:12 PM EDT Patient called. Pt notified of lab results. Per Pt he has been having low blood pressure's running in the 100's over 50's so he has discontinued his losartan for the last 3 days. He states his blood pressure is better.  Per Dr. Lovena Le, he does not recommend Pt stopping losartan.  Will notify Pt of Dr. Tanna Furry recommendations.

## 2017-04-14 NOTE — Telephone Encounter (Signed)
-----   Message from Evans Lance, MD sent at 04/11/2017 10:08 PM EDT ----- With ventricular arrhythmias, we will allow his Mg to be a little high. No change. GT

## 2017-04-22 ENCOUNTER — Telehealth: Payer: Self-pay | Admitting: Cardiology

## 2017-04-22 NOTE — Telephone Encounter (Signed)
LMOVM requesting that pt send manual transmission b/c home monitor has not updated in at least 7 days.    

## 2017-04-29 ENCOUNTER — Telehealth: Payer: Self-pay | Admitting: Cardiology

## 2017-04-29 ENCOUNTER — Telehealth: Payer: Self-pay | Admitting: Internal Medicine

## 2017-04-29 ENCOUNTER — Other Ambulatory Visit: Payer: Self-pay | Admitting: Internal Medicine

## 2017-04-29 DIAGNOSIS — I5022 Chronic systolic (congestive) heart failure: Secondary | ICD-10-CM

## 2017-04-29 DIAGNOSIS — I10 Essential (primary) hypertension: Secondary | ICD-10-CM

## 2017-04-29 MED FILL — BISOPROLOL FUMARATE 10 MG T: 10 | 30 days supply | Qty: 30 | Fill #0

## 2017-04-29 MED FILL — SYMBICORT 80-4.5 MCG INH: 80-4.5 | 30 days supply | Qty: 10 | Fill #0

## 2017-04-29 NOTE — Telephone Encounter (Signed)
°*  STAT* If patient is at the pharmacy, call can be transferred to refill team.   1. Which medications need to be refilled? (please list name of each medication and dose if known)Bidoprolol 60 mg    2. Which pharmacy/location (including street and city if local pharmacy) is medication to be sent to?[facility]  3. Do they need a 30 day or 90 day supply? [place]

## 2017-04-29 NOTE — Telephone Encounter (Signed)
LMOVM requesting that pt send manual transmission b/c home monitor has not updated in at least 14 days.    

## 2017-05-02 MED FILL — AMIODARONE HCL 200 MG TAB: 200 | 30 days supply | Qty: 30 | Fill #1

## 2017-05-26 ENCOUNTER — Ambulatory Visit: Payer: BLUE CROSS/BLUE SHIELD | Admitting: Internal Medicine

## 2017-05-27 MED FILL — BISOPROLOL FUMARATE 10 MG T: 10 | 30 days supply | Qty: 30 | Fill #1

## 2017-05-30 MED FILL — AMIODARONE HCL 200 MG TAB: 200 | 30 days supply | Qty: 30 | Fill #2

## 2017-06-22 MED FILL — SYMBICORT 80-4.5 MCG INH: 80-4.5 | 30 days supply | Qty: 10 | Fill #1

## 2017-06-27 MED FILL — LOSARTAN POTASSIUM 50 MG TA: 50 | 30 days supply | Qty: 15 | Fill #1

## 2017-06-27 MED FILL — AMIODARONE HCL 200 MG TAB: 200 | 30 days supply | Qty: 30 | Fill #3

## 2017-06-27 MED FILL — BISOPROLOL FUMARATE 10 MG T: 10 | 30 days supply | Qty: 30 | Fill #2

## 2017-06-28 ENCOUNTER — Encounter: Payer: BLUE CROSS/BLUE SHIELD | Admitting: *Deleted

## 2017-06-28 ENCOUNTER — Telehealth: Payer: Self-pay | Admitting: Cardiology

## 2017-06-28 ENCOUNTER — Other Ambulatory Visit: Payer: BLUE CROSS/BLUE SHIELD | Admitting: *Deleted

## 2017-06-28 DIAGNOSIS — I5022 Chronic systolic (congestive) heart failure: Secondary | ICD-10-CM

## 2017-06-28 DIAGNOSIS — I4901 Ventricular fibrillation: Secondary | ICD-10-CM

## 2017-06-28 DIAGNOSIS — I10 Essential (primary) hypertension: Secondary | ICD-10-CM

## 2017-06-28 LAB — BASIC METABOLIC PANEL
BUN / CREAT RATIO: 13 (ref 9–20)
BUN: 15 mg/dL (ref 6–24)
CO2: 24 mmol/L (ref 20–29)
CREATININE: 1.16 mg/dL (ref 0.76–1.27)
Calcium: 9.5 mg/dL (ref 8.7–10.2)
Chloride: 105 mmol/L (ref 96–106)
GFR calc Af Amer: 87 mL/min/{1.73_m2} (ref >59)
GFR calc non Af Amer: 75 mL/min/{1.73_m2} (ref >59)
GLUCOSE: 88 mg/dL (ref 65–99)
Potassium: 4.5 mmol/L (ref 3.5–5.2)
Sodium: 145 mmol/L — ABNORMAL HIGH (ref 134–144)

## 2017-06-28 LAB — HEPATIC FUNCTION PANEL
ALT: 34 IU/L (ref 0–44)
AST: 20 IU/L (ref 0–40)
Albumin: 4.3 g/dL (ref 3.5–5.5)
Alkaline Phosphatase: 65 IU/L (ref 39–117)
BILIRUBIN, DIRECT: 0.17 mg/dL (ref 0.00–0.40)
Bilirubin Total: 0.5 mg/dL (ref 0.0–1.2)
TOTAL PROTEIN: 6.4 g/dL (ref 6.0–8.5)

## 2017-06-28 LAB — TSH: TSH: 3.89 u[IU]/mL (ref 0.450–4.500)

## 2017-06-28 NOTE — Telephone Encounter (Signed)
LMOVM reminding pt to send remote transmission.   

## 2017-06-30 ENCOUNTER — Encounter: Payer: Self-pay | Admitting: Cardiology

## 2017-07-01 ENCOUNTER — Ambulatory Visit (INDEPENDENT_AMBULATORY_CARE_PROVIDER_SITE_OTHER): Payer: BLUE CROSS/BLUE SHIELD | Admitting: Internal Medicine

## 2017-07-01 ENCOUNTER — Encounter: Payer: Self-pay | Admitting: Internal Medicine

## 2017-07-01 VITALS — BP 130/76 | HR 48 | Ht 74.0 in | Wt 228.0 lb

## 2017-07-01 DIAGNOSIS — I5022 Chronic systolic (congestive) heart failure: Secondary | ICD-10-CM

## 2017-07-01 DIAGNOSIS — I10 Essential (primary) hypertension: Secondary | ICD-10-CM

## 2017-07-01 DIAGNOSIS — Z4502 Encounter for adjustment and management of automatic implantable cardiac defibrillator: Secondary | ICD-10-CM

## 2017-07-01 DIAGNOSIS — I4901 Ventricular fibrillation: Secondary | ICD-10-CM

## 2017-07-01 MED ORDER — SACUBITRIL-VALSARTAN 24-26 MG PO TABS: 1.0000 | ORAL_TABLET | Freq: Two times a day (BID) | ORAL | 11 refills | Status: DC

## 2017-07-01 MED FILL — **ENTRESTO 24-26 MG TABLET: 24-26 | 7 days supply | Qty: 14 | Fill #0 | Status: TO

## 2017-07-01 NOTE — Patient Instructions (Addendum)
Medication Instructions:  Your physician has recommended you make the following change in your medication:  1.  Stop taking losartan. 2.  Start taking Entresto (sacubitril-valsartan) 24-26 mg one tablet by mouth twice a day.  Labwork: You will need repeat blood work after you get back from your trip.  Come to the [facility] office on August 08, 2017 for a BMP.  Testing/Procedures: None ordered.  Follow-Up: Your physician wants you to follow-up in: 2 months with Dr. Lovena Le.   Remote monitoring is used to monitor your ICD from home. This monitoring reduces the number of office visits required to check your device to one time per year. It allows [place] to keep an eye on the functioning of your device to ensure it is working properly. You are scheduled for a device check from home on 10/03/2017. You may send your transmission at any time that day. If you have a wireless device, the transmission will be sent automatically. After your physician reviews your transmission, you will receive a postcard with your next transmission date.  Any Other Special Instructions Will Be Listed Below (If Applicable).  If you need a refill on your cardiac medications before your next appointment, please call your pharmacy.

## 2017-07-03 LAB — CUP PACEART INCLINIC DEVICE CHECK
Battery Remaining Longevity: 42 mo
Date Time Interrogation Session: 20181221215330
HIGH POWER IMPEDANCE MEASURED VALUE: 44.2672
Implantable Lead Implant Date: 20110805
Implantable Lead Serial Number: 39993
Implantable Pulse Generator Implant Date: 20110805
Lead Channel Impedance Value: 375 Ohm
Lead Channel Pacing Threshold Amplitude: 0.75 V
Lead Channel Pacing Threshold Pulse Width: 0.4 ms
Lead Channel Sensing Intrinsic Amplitude: 11.7 mV
Lead Channel Setting Pacing Pulse Width: 0.4 ms
Lead Channel Setting Sensing Sensitivity: 0.5 mV
MDC IDC LEAD LOCATION: 753860
MDC IDC MSMT LEADCHNL RV PACING THRESHOLD AMPLITUDE: 0.75 V
MDC IDC MSMT LEADCHNL RV PACING THRESHOLD PULSEWIDTH: 0.4 ms
MDC IDC SET LEADCHNL RV PACING AMPLITUDE: 2.5 V
MDC IDC STAT BRADY RV PERCENT PACED: 0.1 %
Pulse Gen Serial Number: 728671

## 2017-07-05 ENCOUNTER — Encounter: Payer: Self-pay | Admitting: Internal Medicine

## 2017-07-05 NOTE — Progress Notes (Signed)
HPI [name] returns today for ongoing evaluation and management of his ICD, VT, and chronic systolic heart failure. In the interim, he has had no ICD shocks and denies chest pain. He has class 2 CHF. He notes fatigue and weakness. His sob has worsened. No syncope or chest pain. Allergies  Allergen Reactions  . Ace Inhibitors Cough     Current Outpatient Medications  Medication Sig Dispense Refill  . albuterol (VENTOLIN HFA) 108 (90 Base) MCG/ACT inhaler INHALE 1-2 PUFFS INTO THE LUNGS EVERY 6 HOURS AS NEEDED FOR WHEEZING OR SHORTNESS OF BREATH. 54 g 3  . amiodarone (PACERONE) 200 MG tablet Take 1 tablet (200 mg total) by mouth daily. 90 tablet 3  . aspirin 81 MG tablet Take 1 tablet (81 mg total) by mouth 2 (two) times daily. 30 tablet 11  . bisoprolol (ZEBETA) 10 MG tablet Take 1 tablet (10 mg total) by mouth daily. 30 tablet 10  . budesonide-formoterol (SYMBICORT) 80-4.5 MCG/ACT inhaler Inhale 2 puffs into the lungs 2 (two) times daily. During winter to prevent asthma flares 10.2 g 11  . sacubitril-valsartan (ENTRESTO) 24-26 MG Take 1 tablet by mouth 2 (two) times daily. 60 tablet 11   No current facility-administered medications for this visit.      Past Medical History:  Diagnosis Date  . CHF (congestive heart failure) (HCC)    chronic  . DCM (dilated cardiomyopathy) ([place])   . Dyslipidemia   . HTN (hypertension)   . Hx of ventricular fibrillation    Hx of VFib arrest s/p AICD placement  . LV dysfunction    with an EF in 2007 at 34%    ROS:   All systems reviewed and negative except as noted in the HPI.   Past Surgical History:  Procedure Laterality Date  . CARDIAC DEFIBRILLATOR PLACEMENT    . repeat echo  2008    demonstrated near normalization  . TRANSTHORACIC ECHOCARDIOGRAM  11/09/2005, 01/15/2009, 02/07/2010     Family History  Problem Relation Age of Onset  . Diabetes Father   . Heart Problems Father        unsure of what kind  . Asthma  Maternal Uncle   . Diabetes Maternal Grandmother      Social History   Socioeconomic History  . Marital status: Married    Spouse name: Not on file  . Number of children: Not on file  . Years of education: Not on file  . Highest education level: Not on file  Social Needs  . Financial resource strain: Not on file  . Food insecurity - worry: Not on file  . Food insecurity - inability: Not on file  . Transportation needs - medical: Not on file  . Transportation needs - non-medical: Not on file  Occupational History    Employer: [employer]  Tobacco Use  . Smoking status: Former Smoker    Packs/day: 0.25    Years: 2.00    Pack years: 0.50    Types: Cigarettes    Last attempt to quit: 07/12/1998    Years since quitting: 18.9  . Smokeless tobacco: Never Used  Substance and Sexual Activity  . Alcohol use: No    Comment: Denies  . Drug use: No  . Sexual activity: Not on file  Other Topics Concern  . Not on file  Social History Narrative   The patient is married.  He is in native of [place],     living in [place] for  9 years.  He has a history of tobacco use,     but quit smoking a year ago.  Denies alcohol abuse.           BP 130/76   Pulse (!) 48   Ht 6\' 2"  (1.88 m)   Wt 228 lb (103.4 kg)   BMI 29.27 kg/m   Physical Exam:  Well appearing 46 yo man, NAD HEENT: Unremarkable Neck:  7 cm JVD, no thyromegally Lymphatics:  No adenopathy Back:  No CVA tenderness Lungs:  Clear except for rare basilar rales.  HEART:  Regular rate rhythm, no murmurs, no rubs, no clicks Abd:  soft, positive bowel sounds, no organomegally, no rebound, no guarding Ext:  2 plus pulses, no edema, no cyanosis, no clubbing Skin:  No rashes no nodules Neuro:  CN II through XII intact, motor grossly intact  EKG - NSR  DEVICE  Normal device function.  See PaceArt for details.No VT   Assess/Plan: 1. VT - he has had no recurrent VT. He will continue his current meds. Hopefully the Delene Loll will  help to reduce the risk of VT 2. Chronic systolic heart failure - his symptoms are not well controlled. I have asked him to switch to [facility] from losartan. Will uptitrate as his BP allows. 3. ICD - his St. Jude ICD is working normally. Will follow.

## 2017-07-07 ENCOUNTER — Telehealth: Payer: Self-pay | Admitting: *Deleted

## 2017-07-07 NOTE — Telephone Encounter (Signed)
ENTRESTO approved dates 07/07/2017-07/11/2038 from [place].

## 2017-07-07 NOTE — Telephone Encounter (Signed)
Prior authorization for patients ENTRESTO completed through covermymeds. They had in the wrong dosage at 97-103mg  tabs when patient is ordered 24-26 twice daily. I did send them a note on this.

## 2017-07-08 MED FILL — ENTRESTO 24 MG-26 MG TABLET: 24-26 | 30 days supply | Qty: 60 | Fill #1 | Status: TO

## 2017-07-28 MED FILL — AMIODARONE HCL 200 MG TAB: 200 | 30 days supply | Qty: 30 | Fill #4

## 2017-07-28 MED FILL — BISOPROLOL FUMARATE 10 MG T: 10 | 30 days supply | Qty: 30 | Fill #3

## 2017-08-08 ENCOUNTER — Other Ambulatory Visit: Payer: BLUE CROSS/BLUE SHIELD | Admitting: *Deleted

## 2017-08-08 DIAGNOSIS — Z4502 Encounter for adjustment and management of automatic implantable cardiac defibrillator: Secondary | ICD-10-CM

## 2017-08-08 DIAGNOSIS — I10 Essential (primary) hypertension: Secondary | ICD-10-CM

## 2017-08-08 DIAGNOSIS — I5022 Chronic systolic (congestive) heart failure: Secondary | ICD-10-CM

## 2017-08-08 DIAGNOSIS — I4901 Ventricular fibrillation: Secondary | ICD-10-CM

## 2017-08-08 LAB — BASIC METABOLIC PANEL
BUN / CREAT RATIO: 15 (ref 9–20)
BUN: 18 mg/dL (ref 6–24)
CALCIUM: 9 mg/dL (ref 8.7–10.2)
CO2: 25 mmol/L (ref 20–29)
CREATININE: 1.18 mg/dL (ref 0.76–1.27)
Chloride: 103 mmol/L (ref 96–106)
GFR calc Af Amer: 85 mL/min/{1.73_m2} (ref >59)
GFR calc non Af Amer: 74 mL/min/{1.73_m2} (ref >59)
GLUCOSE: 79 mg/dL (ref 65–99)
Potassium: 4.4 mmol/L (ref 3.5–5.2)
Sodium: 142 mmol/L (ref 134–144)

## 2017-08-08 MED FILL — ENTRESTO 24 MG-26 MG TABLET: 24-26 | 30 days supply | Qty: 60 | Fill #2 | Status: TO

## 2017-08-12 ENCOUNTER — Other Ambulatory Visit: Payer: Self-pay | Admitting: Pharmacist

## 2017-08-12 MED ORDER — INFLUENZA VAC SPLIT QUAD 0.5 ML IM SUSY: 0.5000 mL | PREFILLED_SYRINGE | Freq: Once | INTRAMUSCULAR | 0 refills | Status: AC

## 2017-08-12 MED FILL — FLUARIX QUADRIVALENT 0.5 ML: 0.5 | 1 days supply | Qty: 1 | Fill #0

## 2017-08-23 MED FILL — BISOPROLOL FUMARATE 10 MG T: 10 | 30 days supply | Qty: 30 | Fill #4

## 2017-08-25 ENCOUNTER — Telehealth: Payer: Self-pay | Admitting: Cardiology

## 2017-08-25 NOTE — Telephone Encounter (Signed)
Spoke w/ pt and requested that he send a manual transmission b/c his home monitor has not updated in at least 7 days.   

## 2017-08-29 ENCOUNTER — Encounter (HOSPITAL_COMMUNITY): Payer: Self-pay | Admitting: Emergency Medicine

## 2017-08-29 ENCOUNTER — Ambulatory Visit (HOSPITAL_COMMUNITY)
Admission: EM | Admit: 2017-08-29 | Discharge: 2017-08-29 | Disposition: A | Discharge status: Home / Self Care | Payer: BLUE CROSS/BLUE SHIELD | Attending: Internal Medicine | Admitting: Internal Medicine

## 2017-08-29 ENCOUNTER — Other Ambulatory Visit: Payer: Self-pay

## 2017-08-29 DIAGNOSIS — H5789 Other specified disorders of eye and adnexa: Secondary | ICD-10-CM

## 2017-08-29 DIAGNOSIS — H109 Unspecified conjunctivitis: Secondary | ICD-10-CM

## 2017-08-29 DIAGNOSIS — L299 Pruritus, unspecified: Secondary | ICD-10-CM

## 2017-08-29 DIAGNOSIS — L03213 Periorbital cellulitis: Secondary | ICD-10-CM

## 2017-08-29 MED ORDER — CEFDINIR 300 MG PO CAPS: 300.0000 mg | ORAL_CAPSULE | Freq: Two times a day (BID) | ORAL | 0 refills | Status: DC

## 2017-08-29 MED ORDER — ERYTHROMYCIN 5 MG/GM OP OINT: TOPICAL_OINTMENT | OPHTHALMIC | 0 refills | Status: DC

## 2017-08-29 MED ORDER — CETIRIZINE HCL 10 MG PO TABS: 10.0000 mg | ORAL_TABLET | Freq: Every day | ORAL | 11 refills | Status: DC

## 2017-08-29 NOTE — ED Provider Notes (Signed)
  MRN: [id number] DOB: 1971/02/01  Subjective:   [name] is a 47 y.o. male presenting for 2 week history of left eye discomfort, itching, redness, clear-yellowish drainage. Now having eyelid swelling and lower eyelid pain. Denies fever, photophobia, blurred vision, sinus pain, congestion, sore throat, cough.   [name] is allergic to ace inhibitors.  [name]  has a past medical history of CHF (congestive heart failure) ([place]), DCM (dilated cardiomyopathy) ([place]), Dyslipidemia, HTN (hypertension), ventricular fibrillation, and LV dysfunction. Also  has a past surgical history that includes repeat echo (2008); transthoracic echocardiogram (11/09/2005, 01/15/2009, 02/07/2010); and Cardiac defibrillator placement.  Objective:   Vitals: BP (!) 127/57 (BP Location: Right Arm)   Pulse (!) 46   Temp 98.1 F (36.7 C) (Oral)   Resp 16   Ht 6\' 2"  (1.88 m)   Wt 230 lb (104.3 kg)   SpO2 98%   BMI 29.53 kg/m   Visual Acuity Bilateral Distance:20/40 R Distance:20/40 L Distance:20/50  Physical Exam  Constitutional: He is oriented to person, place, and time. He appears well-developed and well-nourished.  Eyes: EOM are normal. Pupils are equal, round, and reactive to light. Right eye exhibits no chemosis, no discharge, no exudate and no hordeolum. No foreign body present in the right eye. Left eye exhibits no chemosis, no discharge, no exudate and no hordeolum. No foreign body present in the left eye. Right conjunctiva is not injected. Right conjunctiva has no hemorrhage. Left conjunctiva is injected. Left conjunctiva has no hemorrhage. No scleral icterus.    Cardiovascular: Normal rate.  Pulmonary/Chest: Effort normal.  Neurological: He is alert and oriented to person, place, and time.   Assessment and Plan :   Preseptal cellulitis  Redness of left eye  Conjunctivitis of left eye, unspecified conjunctivitis type  Itching  Start cefdinir, erythromycin. Use Zyrtec for itching.  Return-to-clinic precautions discussed, patient verbalized understanding. Otherwise, follow up in 2 days.     Jaynee Eagles, [place] 08/29/17 [id number]

## 2017-08-29 NOTE — ED Triage Notes (Signed)
The patient presented to the [facility] with a complaint of left eye swelling and pain x 2 weeks.

## 2017-08-31 MED FILL — AMIODARONE HCL 200 MG TAB: 200 | 30 days supply | Qty: 30 | Fill #5

## 2017-09-01 ENCOUNTER — Ambulatory Visit: Payer: BLUE CROSS/BLUE SHIELD

## 2017-09-02 ENCOUNTER — Encounter: Payer: BLUE CROSS/BLUE SHIELD | Admitting: Internal Medicine

## 2017-09-09 ENCOUNTER — Encounter: Payer: Self-pay | Admitting: Internal Medicine

## 2017-09-09 ENCOUNTER — Ambulatory Visit (INDEPENDENT_AMBULATORY_CARE_PROVIDER_SITE_OTHER): Payer: BLUE CROSS/BLUE SHIELD | Admitting: Internal Medicine

## 2017-09-09 VITALS — BP 110/82 | HR 51 | Ht 74.0 in | Wt 232.0 lb

## 2017-09-09 DIAGNOSIS — I10 Essential (primary) hypertension: Secondary | ICD-10-CM | POA: Diagnosis not present

## 2017-09-09 DIAGNOSIS — I5022 Chronic systolic (congestive) heart failure: Secondary | ICD-10-CM

## 2017-09-09 DIAGNOSIS — Z4502 Encounter for adjustment and management of automatic implantable cardiac defibrillator: Secondary | ICD-10-CM

## 2017-09-09 DIAGNOSIS — I4901 Ventricular fibrillation: Secondary | ICD-10-CM | POA: Diagnosis not present

## 2017-09-09 NOTE — Patient Instructions (Addendum)
Medication Instructions:  Your physician recommends that you continue on your current medications as directed. Please refer to the Current Medication list given to you today.  Labwork: None ordered.  Testing/Procedures: None ordered.  Follow-Up: Your physician wants you to follow-up in: 6 months with Dr. Lovena Le.   You will receive a reminder letter in the mail two months in advance. If you don't receive a letter, please call our office to schedule the follow-up appointment.  Remote monitoring is used to monitor your ICD from home. This monitoring reduces the number of office visits required to check your device to one time per year. It allows [place] to keep an eye on the functioning of your device to ensure it is working properly. You are scheduled for a device check from home on 10/03/2017. You may send your transmission at any time that day. If you have a wireless device, the transmission will be sent automatically. After your physician reviews your transmission, you will receive a postcard with your next transmission date.  Any Other Special Instructions Will Be Listed Below (If Applicable).  If you need further assistance with [facility] call me Bing Neighbors [phone]  If you need a refill on your cardiac medications before your next appointment, please call your pharmacy.

## 2017-09-09 NOTE — Progress Notes (Signed)
HPI Mr. Schnyder returns today for ongoing evaluation and management of his ICD, VT, and chronic systolic heart failure. In the interim, he has had no ICD shocks and denies chest pain. He has class 2 CHF. He notes fatigue and weakness. His sob has improved on Entresto but he is having some problems with the cost. No syncope or chest pain. He denies peripheral edema. Allergies  Allergen Reactions  . Ace Inhibitors Cough     Current Outpatient Medications  Medication Sig Dispense Refill  . amiodarone (PACERONE) 200 MG tablet Take 1 tablet (200 mg total) by mouth daily. 90 tablet 3  . aspirin 81 MG tablet Take 1 tablet (81 mg total) by mouth 2 (two) times daily. 30 tablet 11  . bisoprolol (ZEBETA) 10 MG tablet Take 1 tablet (10 mg total) by mouth daily. 30 tablet 10  . cetirizine (ZYRTEC ALLERGY) 10 MG tablet Take 1 tablet (10 mg total) by mouth daily. 30 tablet 11  . sacubitril-valsartan (ENTRESTO) 24-26 MG Take 1 tablet by mouth 2 (two) times daily. 60 tablet 11   No current facility-administered medications for this visit.      Past Medical History:  Diagnosis Date  . CHF (congestive heart failure) (HCC)    chronic  . DCM (dilated cardiomyopathy) ([place])   . Dyslipidemia   . HTN (hypertension)   . Hx of ventricular fibrillation    Hx of VFib arrest s/p AICD placement  . LV dysfunction    with an EF in 2007 at 34%    ROS:   All systems reviewed and negative except as noted in the HPI.   Past Surgical History:  Procedure Laterality Date  . CARDIAC DEFIBRILLATOR PLACEMENT    . repeat echo  2008    demonstrated near normalization  . TRANSTHORACIC ECHOCARDIOGRAM  11/09/2005, 01/15/2009, 02/07/2010     Family History  Problem Relation Age of Onset  . Diabetes Father   . Heart Problems Father        unsure of what kind  . Asthma Maternal Uncle   . Diabetes Maternal Grandmother      Social History   Socioeconomic History  . Marital status: Married    Spouse  name: Not on file  . Number of children: Not on file  . Years of education: Not on file  . Highest education level: Not on file  Social Needs  . Financial resource strain: Not on file  . Food insecurity - worry: Not on file  . Food insecurity - inability: Not on file  . Transportation needs - medical: Not on file  . Transportation needs - non-medical: Not on file  Occupational History    Employer: [employer]  Tobacco Use  . Smoking status: Former Smoker    Packs/day: 0.25    Years: 2.00    Pack years: 0.50    Types: Cigarettes    Last attempt to quit: 07/12/1998    Years since quitting: 19.1  . Smokeless tobacco: Never Used  Substance and Sexual Activity  . Alcohol use: No    Comment: Denies  . Drug use: No  . Sexual activity: Not on file  Other Topics Concern  . Not on file  Social History Narrative   The patient is married.  He is in native of [place],     living in [place] for 9 years.  He has a history of tobacco use,     but quit smoking a year ago.  Denies alcohol abuse.           BP 110/82 (BP Location: Left Arm, Patient Position: Sitting, Cuff Size: Normal)   Pulse (!) 51   Ht 6\' 2"  (1.88 m)   Wt 232 lb (105.2 kg)   SpO2 97%   BMI 29.79 kg/m   Physical Exam:  Well appearing middle aged man, NAD HEENT: Unremarkable Neck: 7 cm JVD, no thyromegally Lymphatics:  No adenopathy Back:  No CVA tenderness Lungs:  Clear with no wheezes HEART:  Regular rate rhythm, no murmurs, no rubs, no clicks Abd:  soft, positive bowel sounds, no organomegally, no rebound, no guarding Ext:  2 plus pulses, no edema, no cyanosis, no clubbing Skin:  No rashes no nodules Neuro:  CN II through XII intact, motor grossly intact  DEVICE  Normal device function.  See PaceArt for details.   Assess/Plan: 1. Chronic systolic heart failure - his symptoms are well controlled. He is encouraged to maintain a low sodium diet.  2. VT/VF - He has not had any additional VT or VF on amiodarone.  He will continue 200 mg daily 3. ICD - his St. Jude ICD is working normally. Will recheck in several months.  Mikle Bosworth.D.

## 2017-09-12 LAB — CUP PACEART INCLINIC DEVICE CHECK
Battery Remaining Longevity: 44 mo
HighPow Impedance: 54.0644
Implantable Lead Implant Date: 20110805
Implantable Lead Location: 753860
Implantable Lead Model: 185
Implantable Pulse Generator Implant Date: 20110805
Lead Channel Impedance Value: 412.5 Ohm
Lead Channel Pacing Threshold Amplitude: 0.75 V
Lead Channel Pacing Threshold Pulse Width: 0.4 ms
Lead Channel Setting Pacing Amplitude: 2.5 V
Lead Channel Setting Sensing Sensitivity: 0.5 mV
MDC IDC LEAD SERIAL: 39993
MDC IDC MSMT LEADCHNL RV PACING THRESHOLD AMPLITUDE: 0.75 V
MDC IDC MSMT LEADCHNL RV PACING THRESHOLD PULSEWIDTH: 0.4 ms
MDC IDC MSMT LEADCHNL RV SENSING INTR AMPL: 11.7 mV
MDC IDC PG SERIAL: 728671
MDC IDC SESS DTM: 20190301222153
MDC IDC SET LEADCHNL RV PACING PULSEWIDTH: 0.4 ms
MDC IDC STAT BRADY RV PERCENT PACED: 0.84 %

## 2017-09-12 MED FILL — ENTRESTO 24 MG-26 MG TABLET: 24-26 | 30 days supply | Qty: 60 | Fill #3 | Status: TO

## 2017-09-26 MED FILL — BISOPROLOL FUMARATE 10 MG T: 10 | 30 days supply | Qty: 30 | Fill #5

## 2017-10-03 ENCOUNTER — Encounter: Payer: BLUE CROSS/BLUE SHIELD | Admitting: *Deleted

## 2017-10-03 ENCOUNTER — Telehealth: Payer: Self-pay | Admitting: Cardiology

## 2017-10-03 NOTE — Telephone Encounter (Signed)
Attempted to confirm remote transmission with pt. No answer and was unable to leave a message.   

## 2017-10-05 MED FILL — AMIODARONE HCL 200 MG TAB: 200 | 30 days supply | Qty: 30 | Fill #6

## 2017-10-07 ENCOUNTER — Encounter: Payer: Self-pay | Admitting: Cardiology

## 2017-10-10 ENCOUNTER — Telehealth: Payer: Self-pay | Admitting: Internal Medicine

## 2017-10-10 MED FILL — ENTRESTO 24 MG-26 MG TABLET: 24-26 | 30 days supply | Qty: 60 | Fill #4 | Status: TO

## 2017-10-10 NOTE — Telephone Encounter (Signed)
Walk In pt Form-[facility] paper Dropped off. Placed in Dr.Taylor doc box.

## 2017-10-18 ENCOUNTER — Ambulatory Visit (INDEPENDENT_AMBULATORY_CARE_PROVIDER_SITE_OTHER): Payer: BLUE CROSS/BLUE SHIELD | Admitting: *Deleted

## 2017-10-18 DIAGNOSIS — I4901 Ventricular fibrillation: Secondary | ICD-10-CM

## 2017-10-19 NOTE — Progress Notes (Signed)
Remote ICD transmission.   

## 2017-10-20 ENCOUNTER — Encounter: Payer: Self-pay | Admitting: Cardiology

## 2017-10-26 MED FILL — BISOPROLOL FUMARATE 10 MG T: 10 | 30 days supply | Qty: 30 | Fill #6

## 2017-11-04 MED FILL — AMIODARONE HCL 200 MG TAB: 200 | 30 days supply | Qty: 30 | Fill #7

## 2017-11-08 MED FILL — ENTRESTO 24 MG-26 MG TABLET: 24-26 | 30 days supply | Qty: 60 | Fill #5 | Status: TO

## 2017-11-18 LAB — CUP PACEART REMOTE DEVICE CHECK
Date Time Interrogation Session: 20190510104207
Implantable Lead Implant Date: 20110805
Implantable Lead Model: 185
Implantable Lead Serial Number: 39993
Lead Channel Setting Sensing Sensitivity: 0.5 mV
MDC IDC LEAD LOCATION: 753860
MDC IDC PG IMPLANT DT: 20110805
MDC IDC SET LEADCHNL RV PACING AMPLITUDE: 2.5 V
MDC IDC SET LEADCHNL RV PACING PULSEWIDTH: 0.4 ms
Pulse Gen Serial Number: 728671

## 2017-11-28 MED FILL — BISOPROLOL FUMARATE 10 MG T: 10 | 30 days supply | Qty: 30 | Fill #7

## 2017-12-07 MED FILL — AMIODARONE HCL 200 MG TAB: 200 | 30 days supply | Qty: 30 | Fill #8

## 2017-12-14 MED FILL — ENTRESTO 24 MG-26 MG TABLET: 24-26 | 30 days supply | Qty: 60 | Fill #6 | Status: TO

## 2017-12-26 MED FILL — BISOPROLOL FUMARATE 10 MG T: 10 | 30 days supply | Qty: 30 | Fill #8

## 2018-01-05 MED FILL — AMIODARONE HCL 200 MG TAB: 200 | 30 days supply | Qty: 30 | Fill #9

## 2018-01-17 ENCOUNTER — Ambulatory Visit (INDEPENDENT_AMBULATORY_CARE_PROVIDER_SITE_OTHER): Payer: BLUE CROSS/BLUE SHIELD | Admitting: *Deleted

## 2018-01-17 ENCOUNTER — Telehealth: Payer: Self-pay | Admitting: Cardiology

## 2018-01-17 DIAGNOSIS — I4901 Ventricular fibrillation: Secondary | ICD-10-CM

## 2018-01-17 NOTE — Telephone Encounter (Signed)
Spoke with pt and reminded pt of remote transmission that is due today. Pt verbalized understanding.   

## 2018-01-18 NOTE — Progress Notes (Signed)
Remote ICD transmission.   

## 2018-01-21 ENCOUNTER — Emergency Department (HOSPITAL_COMMUNITY): Payer: BLUE CROSS/BLUE SHIELD

## 2018-01-21 ENCOUNTER — Encounter (HOSPITAL_COMMUNITY): Payer: Self-pay | Admitting: Emergency Medicine

## 2018-01-21 ENCOUNTER — Emergency Department (HOSPITAL_COMMUNITY)
Admission: EM | Admit: 2018-01-21 | Discharge: 2018-01-21 | Disposition: A | Discharge status: Home / Self Care | Payer: BLUE CROSS/BLUE SHIELD | Attending: Emergency Medicine | Admitting: Emergency Medicine

## 2018-01-21 ENCOUNTER — Ambulatory Visit (HOSPITAL_COMMUNITY)
Admission: EM | Admit: 2018-01-21 | Discharge: 2018-01-21 | Disposition: A | Discharge status: Home / Self Care | Payer: BLUE CROSS/BLUE SHIELD | Source: Home / Self Care | Attending: Internal Medicine | Admitting: Internal Medicine

## 2018-01-21 ENCOUNTER — Encounter (HOSPITAL_COMMUNITY): Payer: Self-pay

## 2018-01-21 ENCOUNTER — Other Ambulatory Visit: Payer: Self-pay

## 2018-01-21 DIAGNOSIS — Z87891 Personal history of nicotine dependence: Secondary | ICD-10-CM | POA: Diagnosis not present

## 2018-01-21 DIAGNOSIS — N179 Acute kidney failure, unspecified: Secondary | ICD-10-CM | POA: Diagnosis not present

## 2018-01-21 DIAGNOSIS — Z7982 Long term (current) use of aspirin: Secondary | ICD-10-CM | POA: Insufficient documentation

## 2018-01-21 DIAGNOSIS — R109 Unspecified abdominal pain: Secondary | ICD-10-CM

## 2018-01-21 DIAGNOSIS — Z95810 Presence of automatic (implantable) cardiac defibrillator: Secondary | ICD-10-CM | POA: Insufficient documentation

## 2018-01-21 DIAGNOSIS — N201 Calculus of ureter: Secondary | ICD-10-CM | POA: Insufficient documentation

## 2018-01-21 DIAGNOSIS — Z79899 Other long term (current) drug therapy: Secondary | ICD-10-CM | POA: Insufficient documentation

## 2018-01-21 DIAGNOSIS — R7989 Other specified abnormal findings of blood chemistry: Secondary | ICD-10-CM

## 2018-01-21 DIAGNOSIS — I110 Hypertensive heart disease with heart failure: Secondary | ICD-10-CM | POA: Diagnosis not present

## 2018-01-21 DIAGNOSIS — I5022 Chronic systolic (congestive) heart failure: Secondary | ICD-10-CM | POA: Diagnosis not present

## 2018-01-21 DIAGNOSIS — Z87442 Personal history of urinary calculi: Secondary | ICD-10-CM

## 2018-01-21 LAB — CBC WITH DIFFERENTIAL/PLATELET
Abs Immature Granulocytes: 0 10*3/uL (ref 0.0–0.1)
BASOS ABS: 0.1 10*3/uL (ref 0.0–0.1)
BASOS PCT: 1 %
EOS ABS: 0.5 10*3/uL (ref 0.0–0.7)
Eosinophils Relative: 7 %
HCT: 50.5 % (ref 39.0–52.0)
Hemoglobin: 16.8 g/dL (ref 13.0–17.0)
IMMATURE GRANULOCYTES: 0 %
Lymphocytes Relative: 29 %
Lymphs Abs: 2.3 10*3/uL (ref 0.7–4.0)
MCH: 28.8 pg (ref 26.0–34.0)
MCHC: 33.3 g/dL (ref 30.0–36.0)
MCV: 86.6 fL (ref 78.0–100.0)
MONOS PCT: 9 %
Monocytes Absolute: 0.7 10*3/uL (ref 0.1–1.0)
NEUTROS PCT: 54 %
Neutro Abs: 4.3 10*3/uL (ref 1.7–7.7)
Platelets: 233 10*3/uL (ref 150–400)
RBC: 5.83 MIL/uL — ABNORMAL HIGH (ref 4.22–5.81)
RDW: 13.7 % (ref 11.5–15.5)
WBC: 7.8 10*3/uL (ref 4.0–10.5)

## 2018-01-21 LAB — POCT I-STAT, CHEM 8
BUN: 21 mg/dL — ABNORMAL HIGH (ref 6–20)
CALCIUM ION: 1.19 mmol/L (ref 1.15–1.40)
Chloride: 103 mmol/L (ref 98–111)
Creatinine, Ser: 2 mg/dL — ABNORMAL HIGH (ref 0.61–1.24)
GLUCOSE: 105 mg/dL — AB (ref 70–99)
HCT: 48 % (ref 39.0–52.0)
HEMOGLOBIN: 16.3 g/dL (ref 13.0–17.0)
Potassium: 4.5 mmol/L (ref 3.5–5.1)
SODIUM: 139 mmol/L (ref 135–145)
TCO2: 25 mmol/L (ref 22–32)

## 2018-01-21 LAB — URINALYSIS, ROUTINE W REFLEX MICROSCOPIC
BILIRUBIN URINE: NEGATIVE
GLUCOSE, UA: NEGATIVE mg/dL
Ketones, ur: NEGATIVE mg/dL
Leukocytes, UA: NEGATIVE
NITRITE: NEGATIVE
PH: 5 (ref 5.0–8.0)
Protein, ur: NEGATIVE mg/dL
SPECIFIC GRAVITY, URINE: 1.013 (ref 1.005–1.030)

## 2018-01-21 LAB — COMPREHENSIVE METABOLIC PANEL
ALK PHOS: 53 U/L (ref 38–126)
ALT: 22 U/L (ref 0–44)
AST: 20 U/L (ref 15–41)
Albumin: 3.5 g/dL (ref 3.5–5.0)
Anion gap: 8 (ref 5–15)
BILIRUBIN TOTAL: 0.9 mg/dL (ref 0.3–1.2)
BUN: 18 mg/dL (ref 6–20)
CALCIUM: 9.1 mg/dL (ref 8.9–10.3)
CO2: 27 mmol/L (ref 22–32)
CREATININE: 2.04 mg/dL — AB (ref 0.61–1.24)
Chloride: 103 mmol/L (ref 98–111)
GFR, EST AFRICAN AMERICAN: 43 mL/min — AB (ref >60)
GFR, EST NON AFRICAN AMERICAN: 37 mL/min — AB (ref >60)
Glucose, Bld: 106 mg/dL — ABNORMAL HIGH (ref 70–99)
Potassium: 4.2 mmol/L (ref 3.5–5.1)
SODIUM: 138 mmol/L (ref 135–145)
TOTAL PROTEIN: 6.4 g/dL — AB (ref 6.5–8.1)

## 2018-01-21 LAB — POCT URINALYSIS DIP (DEVICE)
Bilirubin Urine: NEGATIVE
GLUCOSE, UA: NEGATIVE mg/dL
KETONES UR: NEGATIVE mg/dL
LEUKOCYTES UA: NEGATIVE
Nitrite: NEGATIVE
Protein, ur: 30 mg/dL — AB
SPECIFIC GRAVITY, URINE: 1.02 (ref 1.005–1.030)
UROBILINOGEN UA: 0.2 mg/dL (ref 0.0–1.0)
pH: 5.5 (ref 5.0–8.0)

## 2018-01-21 MED ORDER — TAMSULOSIN HCL 0.4 MG PO CAPS: 0.4000 mg | ORAL_CAPSULE | Freq: Every day | ORAL | 0 refills | Status: DC

## 2018-01-21 MED ORDER — OXYCODONE-ACETAMINOPHEN 5-325 MG PO TABS: 1.0000 | ORAL_TABLET | ORAL | 0 refills | Status: DC | PRN

## 2018-01-21 MED ORDER — ONDANSETRON HCL 4 MG PO TABS: 4.0000 mg | ORAL_TABLET | Freq: Four times a day (QID) | ORAL | 0 refills | Status: DC | PRN

## 2018-01-21 MED ORDER — KETOROLAC TROMETHAMINE 60 MG/2ML IM SOLN
60.0000 mg | Freq: Once | INTRAMUSCULAR | Status: AC
  Administered 2018-01-21: 60 mg via INTRAMUSCULAR
  Filled 2018-01-21: qty 2

## 2018-01-21 NOTE — ED Notes (Signed)
Patient transported to CT 

## 2018-01-21 NOTE — Discharge Instructions (Addendum)
Follow-up with urology especially if symptoms persist.  Your creatinine reevaluated by your primary physician.  If you continue to have worsening kidney function you will need to see a nephrologist.  Avoid all NSAIDs including ibuprofen, naproxen etc.  Take pain medication as prescribed.

## 2018-01-21 NOTE — Discharge Instructions (Addendum)
Please go to ER for further evaluation of this likely kidney stone.

## 2018-01-21 NOTE — ED Provider Notes (Signed)
[place]    CSN: [id number] Arrival date & time: 01/21/18  1115     History   Chief Complaint Chief Complaint  Patient presents with  . Flank Pain    HPI [name] is a 47 y.o. male.   [name] presents with complaints of left flank pain which started three days ago and has not subsided. States feels similar to previous kidney stones he has had in the past. Last was 6 months ago which passed on its own in just a day. Took ibuprofen this morning which did help. No blood in urine. No abdominal pain. Normal urination. No fever. Pain 2/10 currently. Pain worse if lay on left side. Yesterday vomited. No current nausea or vomiting. Has seen [facility] in the past, required lithotripsy years ago. Hx of CHF, htn, vfib, asthma, allergies    ROS per HPI.      Past Medical History:  Diagnosis Date  . CHF (congestive heart failure) (HCC)    chronic  . DCM (dilated cardiomyopathy) ([place])   . Dyslipidemia   . HTN (hypertension)   . Hx of ventricular fibrillation    Hx of VFib arrest s/p AICD placement  . LV dysfunction    with an EF in 2007 at 34%    Patient Active Problem List   Diagnosis Date Noted  . Dermatitis 02/10/2017  . Nephrolithiasis 02/10/2017  . HTN (hypertension) 09/22/2015  . Constipation 09/22/2015  . Asthma, intermittent 02/04/2014  . Ventricular fibrillation ([place]) 11/09/2013  . Environmental allergies 10/11/2013  . IFG (impaired fasting glucose) 10/11/2013  . Hyperlipidemia 10/11/2013  . Chronic systolic heart failure ([place]) 05/26/2010  . Automatic implantable cardioverter-defibrillator in situ 05/26/2010    Past Surgical History:  Procedure Laterality Date  . CARDIAC DEFIBRILLATOR PLACEMENT    . repeat echo  2008    demonstrated near normalization  . TRANSTHORACIC ECHOCARDIOGRAM  11/09/2005, 01/15/2009, 02/07/2010       Home Medications    Prior to Admission medications   Medication Sig Start Date End Date Taking?  Authorizing Provider  amiodarone (PACERONE) 200 MG tablet Take 1 tablet (200 mg total) by mouth daily. 04/05/17   Billy Lance, MD  aspirin 81 MG tablet Take 1 tablet (81 mg total) by mouth 2 (two) times daily. 09/22/15   [name], Billy Mccallum, MD  bisoprolol (ZEBETA) 10 MG tablet Take 1 tablet (10 mg total) by mouth daily. 04/29/17   Billy Lance, MD  cetirizine (ZYRTEC ALLERGY) 10 MG tablet Take 1 tablet (10 mg total) by mouth daily. 08/29/17   Billy Eagles, [name]  sacubitril-valsartan (ENTRESTO) 24-26 MG Take 1 tablet by mouth 2 (two) times daily. 07/01/17   Billy Lance, MD    Family History Family History  Problem Relation Age of Onset  . Diabetes Father   . Heart Problems Father        unsure of what kind  . Asthma Maternal Uncle   . Diabetes Maternal Grandmother     Social History Social History   Tobacco Use  . Smoking status: Former Smoker    Packs/day: 0.25    Years: 2.00    Pack years: 0.50    Types: Cigarettes    Last attempt to quit: 07/12/1998    Years since quitting: 19.5  . Smokeless tobacco: Never Used  Substance Use Topics  . Alcohol use: No    Comment: Denies  . Drug use: No     Allergies   Ace inhibitors   Review  of Systems Review of Systems   Physical Exam Triage Vital Signs ED Triage Vitals  Enc Vitals Group     BP 01/21/18 1228 (!) 106/59     Pulse Rate 01/21/18 1228 60     Resp 01/21/18 1228 20     Temp 01/21/18 1228 97.9 F (36.6 C)     Temp Source 01/21/18 1228 Oral     SpO2 01/21/18 1228 98 %     Weight --      Height --      Head Circumference --      Peak Flow --      Pain Score 01/21/18 1223 9     Pain Loc --      Pain Edu? --      Excl. in [place]? --    No data found.  Updated Vital Signs BP (!) 106/59 (BP Location: Left Arm)   Pulse 60   Temp 97.9 F (36.6 C) (Oral)   Resp 20   SpO2 98%    Physical Exam  Constitutional: He is oriented to person, place, and time. He appears well-developed and well-nourished.    Cardiovascular: Normal rate and regular rhythm.  Pulmonary/Chest: Effort normal and breath sounds normal.  Abdominal: Soft. Bowel sounds are normal.  Minimal to no cva tenderness on palpation; patient indicates left flank pain which seems to wax and wane   Neurological: He is alert and oriented to person, place, and time.  Skin: Skin is warm and dry.     UC Treatments / Results  Labs (all labs ordered are listed, but only abnormal results are displayed) Labs Reviewed  POCT URINALYSIS DIP (DEVICE) - Abnormal; Notable for the following components:      Result Value   Hgb urine dipstick MODERATE (*)    Protein, ur 30 (*)    All other components within normal limits  POCT I-STAT, CHEM 8 - Abnormal; Notable for the following components:   BUN 21 (*)    Creatinine, Ser 2.00 (*)    Glucose, Bld 105 (*)    All other components within normal limits    EKG None  Radiology No results found.  Procedures Procedures (including critical care time)  Medications Ordered in UC Medications - No data to display  Initial Impression / Assessment and Plan / UC Course  I have reviewed the triage vital signs and the nursing notes.  Pertinent labs & imaging results that were available during my care of the patient were reviewed by me and considered in my medical decision making (see chart for details).     hgb to urine. Suspect kidney stone. Pain appears currently managed. Creatinine is elevated from baseline. Question obstruction. Recommend going to ER for further evaluation at this time. Patient verbalized understanding and agreeable to plan.  Safe to self transfer with friend to ER.   Final Clinical Impressions(s) / UC Diagnoses   Final diagnoses:  Left flank pain  Elevated serum creatinine  History of kidney stones     Discharge Instructions     Please go to ER for further evaluation of this likely kidney stone.       ED Prescriptions    None     Controlled Substance  Prescriptions [place] Controlled Substance Registry consulted? Not Applicable   Billy Gottron, NP 01/21/18 1341

## 2018-01-21 NOTE — ED Triage Notes (Signed)
Left flank pain, patient reports history of kidney stones.  Patient's pain started 3 days ago.

## 2018-01-21 NOTE — ED Provider Notes (Signed)
[facility] Provider Note   CSN: [id number] Arrival date & time: 01/21/18  1347     History   Chief Complaint Chief Complaint  Patient presents with  . Flank Pain    HPI [name] is a 47 y.o. male.  HPI Patient presents with episodic left flank pain radiating to his left lower abdomen.  This been going on for the past 3 days.  Associated with nausea.  Denies hematuria, dysuria, urgency or frequency.  Has previous history of kidney stones states this feels similarly.  Was seen in urgent care today and told that his kidney function was abnormal to come to the emergency department. Past Medical History:  Diagnosis Date  . CHF (congestive heart failure) (HCC)    chronic  . DCM (dilated cardiomyopathy) ([place])   . Dyslipidemia   . HTN (hypertension)   . Hx of ventricular fibrillation    Hx of VFib arrest s/p AICD placement  . LV dysfunction    with an EF in 2007 at 34%    Patient Active Problem List   Diagnosis Date Noted  . Dermatitis 02/10/2017  . Nephrolithiasis 02/10/2017  . HTN (hypertension) 09/22/2015  . Constipation 09/22/2015  . Asthma, intermittent 02/04/2014  . Ventricular fibrillation ([place]) 11/09/2013  . Environmental allergies 10/11/2013  . IFG (impaired fasting glucose) 10/11/2013  . Hyperlipidemia 10/11/2013  . Chronic systolic heart failure ([place]) 05/26/2010  . Automatic implantable cardioverter-defibrillator in situ 05/26/2010    Past Surgical History:  Procedure Laterality Date  . CARDIAC DEFIBRILLATOR PLACEMENT    . repeat echo  2008    demonstrated near normalization  . TRANSTHORACIC ECHOCARDIOGRAM  11/09/2005, 01/15/2009, 02/07/2010        Home Medications    Prior to Admission medications   Medication Sig Start Date End Date Taking? Authorizing Provider  amiodarone (PACERONE) 200 MG tablet Take 1 tablet (200 mg total) by mouth daily. Patient taking differently: Take 200 mg by mouth daily at 3 pm.   04/05/17  Yes Evans Lance, MD  aspirin 81 MG tablet Take 1 tablet (81 mg total) by mouth 2 (two) times daily. Patient taking differently: Take 81 mg by mouth See admin instructions. Take one tablet (81 mg) by mouth daily at bedtime, may also take one tablet during the day if feeling tired 09/22/15  Yes Funches, Josalyn, MD  bisoprolol (ZEBETA) 10 MG tablet Take 1 tablet (10 mg total) by mouth daily. 04/29/17  Yes Evans Lance, MD  ibuprofen (ADVIL,MOTRIN) 200 MG tablet Take 200 mg by mouth every 6 (six) hours as needed for headache (pain).   Yes [provider]  sacubitril-valsartan (ENTRESTO) 24-26 MG Take 1 tablet by mouth 2 (two) times daily. 07/01/17  Yes Evans Lance, MD  cetirizine (ZYRTEC ALLERGY) 10 MG tablet Take 1 tablet (10 mg total) by mouth daily. Patient not taking: Reported on 01/21/2018 08/29/17   Jaynee Eagles, PA-C  ondansetron [facility]) 4 MG tablet Take 1 tablet (4 mg total) by mouth every 6 (six) hours as needed for nausea or vomiting. 01/21/18   Julianne Rice, MD  oxyCODONE-acetaminophen (PERCOCET) 5-325 MG tablet Take 1-2 tablets by mouth every 4 (four) hours as needed for severe pain. 01/21/18   Julianne Rice, MD  tamsulosin (FLOMAX) 0.4 MG CAPS capsule Take 1 capsule (0.4 mg total) by mouth daily. 01/21/18   Julianne Rice, MD    Family History Family History  Problem Relation Age of Onset  . Diabetes Father   .  Heart Problems Father        unsure of what kind  . Asthma Maternal Uncle   . Diabetes Maternal Grandmother     Social History Social History   Tobacco Use  . Smoking status: Former Smoker    Packs/day: 0.25    Years: 2.00    Pack years: 0.50    Types: Cigarettes    Last attempt to quit: 07/12/1998    Years since quitting: 19.5  . Smokeless tobacco: Never Used  Substance Use Topics  . Alcohol use: No    Comment: Denies  . Drug use: No     Allergies   Ace inhibitors   Review of Systems Review of Systems  Constitutional:  Negative for chills and fever.  Respiratory: Negative for shortness of breath.   Cardiovascular: Negative for chest pain.  Gastrointestinal: Positive for abdominal pain and nausea. Negative for constipation, diarrhea and vomiting.  Genitourinary: Positive for flank pain. Negative for dysuria, frequency and hematuria.  Musculoskeletal: Positive for back pain. Negative for neck pain.  Skin: Negative for rash and wound.  Neurological: Negative for dizziness, weakness, light-headedness, numbness and headaches.  All other systems reviewed and are negative.    Physical Exam Updated Vital Signs BP 112/67 (BP Location: Right Arm)   Pulse (!) 44   Temp 98.5 F (36.9 C) (Oral)   Resp 16   SpO2 97%   Physical Exam  Constitutional: He is oriented to person, place, and time. He appears well-developed and well-nourished. No distress.  HENT:  Head: Normocephalic and atraumatic.  Mouth/Throat: Oropharynx is clear and moist. No oropharyngeal exudate.  Eyes: Pupils are equal, round, and reactive to light. EOM are normal.  Neck: Normal range of motion. Neck supple.  Cardiovascular: Normal rate and regular rhythm.  Pulmonary/Chest: Effort normal and breath sounds normal.  Abdominal: Soft. Bowel sounds are normal. There is tenderness. There is no rebound and no guarding.  Left sided lower abdominal tenderness to palpation.  No rebound or guarding.  Musculoskeletal: Normal range of motion. He exhibits no edema or tenderness.  Mild left-sided CVA tenderness.  Neurological: He is alert and oriented to person, place, and time.  Moving all extremities without focal deficit.  Sensation intact.  Skin: Skin is warm and dry. Capillary refill takes less than 2 seconds. No rash noted. He is not diaphoretic. No erythema.  Psychiatric: He has a normal mood and affect. His behavior is normal.  Nursing note and vitals reviewed.    ED Treatments / Results  Labs (all labs ordered are listed, but only abnormal  results are displayed) Labs Reviewed  URINALYSIS, ROUTINE W REFLEX MICROSCOPIC - Abnormal; Notable for the following components:      Result Value   Hgb urine dipstick MODERATE (*)    Bacteria, UA RARE (*)    All other components within normal limits  COMPREHENSIVE METABOLIC PANEL - Abnormal; Notable for the following components:   Glucose, Bld 106 (*)    Creatinine, Ser 2.04 (*)    Total Protein 6.4 (*)    GFR calc non Af Amer 37 (*)    GFR calc Af Amer 43 (*)    All other components within normal limits  CBC WITH DIFFERENTIAL/PLATELET - Abnormal; Notable for the following components:   RBC 5.83 (*)    All other components within normal limits    EKG None  Radiology Ct Renal Stone Study  Result Date: 01/21/2018 CLINICAL DATA:  47 y/o  M; left flank pain. EXAM:  CT ABDOMEN AND PELVIS WITHOUT CONTRAST TECHNIQUE: Multidetector CT imaging of the abdomen and pelvis was performed following the standard protocol without IV contrast. COMPARISON:  05/27/2012 CT abdomen and pelvis. FINDINGS: Lower chest: No acute abnormality. Pacemaker leads partially visualized within the right ventricle. Hepatobiliary: No focal liver abnormality is seen. Cholelithiasis. No gallbladder wall thickening or biliary ductal dilatation. Pancreas: Unremarkable. No pancreatic ductal dilatation or surrounding inflammatory changes. Spleen: Spleen measures 13.6 x 6.3 x 12.0 cm (volume = 540 cm^3). Adrenals/Urinary Tract: Mild left proximal hydronephrosis with a 5 mm obstructing stone within the mid ureter at L4-5 disc level. Left-sided perinephric stranding. Normal adrenal glands. Punctate stone in the right mid kidney. No right hydronephrosis. Normal bladder. Stomach/Bowel: Stomach is within normal limits. Appendix appears normal. No evidence of bowel wall thickening, distention, or inflammatory changes. Vascular/Lymphatic: No significant vascular findings are present. No enlarged abdominal or pelvic lymph nodes. Reproductive:  Prostate is unremarkable. Other: No abdominal wall hernia or abnormality. No abdominopelvic ascites. Musculoskeletal: No fracture is seen. IMPRESSION: 1. Mild left proximal hydronephrosis and perinephric stranding with 5 mm stone in the left mid ureter. 2. Punctate right kidney nonobstructing stone. 3. Mild splenomegaly. Electronically Signed   By: Kristine Garbe M.D.   On: 01/21/2018 17:41    Procedures Procedures (including critical care time)  Medications Ordered in ED Medications  ketorolac (TORADOL) injection 60 mg (60 mg Intramuscular Given 01/21/18 1745)     Initial Impression / Assessment and Plan / ED Course  I have reviewed the triage vital signs and the nursing notes.  Pertinent labs & imaging results that were available during my care of the patient were reviewed by me and considered in my medical decision making (see chart for details).     Patient is now pain-free.  CT with evidence of mid ureteral stone, 5 mm in the left with mild hydronephrosis.  Has mild elevation in creatinine over his baseline.  This may be related to his kidney stone disease.  He understands the need to follow-up closely with his primary physician and urologist.  Avoid NSAIDs.  Return precautions given.  Final Clinical Impressions(s) / ED Diagnoses   Final diagnoses:  Left ureteral calculus  AKI (acute kidney injury) [facility])    ED Discharge Orders        Ordered    oxyCODONE-acetaminophen (PERCOCET) 5-325 MG tablet  Every 4 hours PRN     01/21/18 1921    ondansetron (ZOFRAN) 4 MG tablet  Every 6 hours PRN     01/21/18 1921    tamsulosin (FLOMAX) 0.4 MG CAPS capsule  Daily     01/21/18 1921       Julianne Rice, MD 01/21/18 1924

## 2018-01-21 NOTE — ED Triage Notes (Signed)
Pt presents for evaluation of L flank pain x 3 days, sent by [facility].

## 2018-01-21 NOTE — ED Notes (Signed)
Pt alert and oriented in NAD. Pt verbalized understanding of discharge instructions. 

## 2018-01-26 MED FILL — BISOPROLOL FUMARATE 10 MG T: 10 | 30 days supply | Qty: 30 | Fill #9

## 2018-01-27 ENCOUNTER — Telehealth: Payer: Self-pay

## 2018-01-27 NOTE — Telephone Encounter (Signed)
LMOVM reminding pt to send remote transmission.   

## 2018-01-30 MED FILL — AMIODARONE HCL 200 MG TAB: 200 | 30 days supply | Qty: 30 | Fill #10

## 2018-02-03 ENCOUNTER — Telehealth: Payer: Self-pay | Admitting: Cardiology

## 2018-02-03 NOTE — Telephone Encounter (Signed)
LMOVM requesting that pt send manual transmission b/c home monitor has not updated in at least 7 days.    

## 2018-02-11 LAB — CUP PACEART REMOTE DEVICE CHECK
Battery Remaining Percentage: 32 %
Battery Voltage: 2.87 V
Date Time Interrogation Session: 20190709202457
HighPow Impedance: 51 Ohm
Implantable Lead Implant Date: 20110805
Implantable Lead Location: 753860
Implantable Lead Serial Number: 39993
Implantable Pulse Generator Implant Date: 20110805
Lead Channel Pacing Threshold Amplitude: 0.75 V
Lead Channel Sensing Intrinsic Amplitude: 12 mV
Lead Channel Setting Sensing Sensitivity: 0.5 mV
MDC IDC MSMT BATTERY REMAINING LONGEVITY: 40 mo
MDC IDC MSMT LEADCHNL RV IMPEDANCE VALUE: 410 Ohm
MDC IDC MSMT LEADCHNL RV PACING THRESHOLD PULSEWIDTH: 0.4 ms
MDC IDC SET LEADCHNL RV PACING AMPLITUDE: 2.5 V
MDC IDC SET LEADCHNL RV PACING PULSEWIDTH: 0.4 ms
MDC IDC STAT BRADY RV PERCENT PACED: 1 %
Pulse Gen Serial Number: 728671

## 2018-02-14 MED FILL — ENTRESTO 24 MG-26 MG TABLET: 24-26 | 30 days supply | Qty: 60 | Fill #0

## 2018-02-22 ENCOUNTER — Telehealth: Payer: Self-pay | Admitting: Cardiology

## 2018-02-22 NOTE — Telephone Encounter (Signed)
LMOVM requesting that pt send manual transmission b/c home monitor has not updated in at least 7 days.    

## 2018-02-24 MED FILL — BISOPROLOL FUMARATE 10 MG T: 10 | 30 days supply | Qty: 30 | Fill #10

## 2018-02-28 MED FILL — AMIODARONE HCL 200 MG TAB: 200 | 30 days supply | Qty: 30 | Fill #11

## 2018-03-10 ENCOUNTER — Telehealth: Payer: Self-pay

## 2018-03-10 NOTE — Telephone Encounter (Signed)
LMOVM reminding pt to send remote transmission. Pt monitor has not updated within 7 days

## 2018-03-16 MED FILL — ENTRESTO 24 MG-26 MG TABLET: 24-26 | 30 days supply | Qty: 60 | Fill #1

## 2018-03-20 ENCOUNTER — Other Ambulatory Visit: Payer: Self-pay | Admitting: Internal Medicine

## 2018-03-20 DIAGNOSIS — I5022 Chronic systolic (congestive) heart failure: Secondary | ICD-10-CM

## 2018-03-20 DIAGNOSIS — I10 Essential (primary) hypertension: Secondary | ICD-10-CM

## 2018-03-21 MED FILL — BISOPROLOL FUMARATE 10 MG T: 10 | 30 days supply | Qty: 30 | Fill #0

## 2018-03-27 ENCOUNTER — Telehealth: Payer: Self-pay | Admitting: Internal Medicine

## 2018-03-27 ENCOUNTER — Other Ambulatory Visit: Payer: Self-pay | Admitting: Internal Medicine

## 2018-03-27 MED ORDER — AMIODARONE HCL 200 MG PO TABS: 200.0000 mg | ORAL_TABLET | Freq: Every day | ORAL | 1 refills | Status: DC

## 2018-03-27 MED FILL — AMIODARONE HCL 200 MG TAB: 200 | 30 days supply | Qty: 30 | Fill #0

## 2018-03-27 NOTE — Telephone Encounter (Signed)
New Messagse        Patient is calling today to discuss medication. Patient states that his creatine is high. Pls call and advise.

## 2018-03-27 NOTE — Telephone Encounter (Signed)
Patient is calling in regards to his Creatinine level 2.0 and medications, possibly causing the elevation.  He was told by the [facility] that he needed to contact his cardiologist to address this issue, in case any adjustments needed to be made.

## 2018-03-29 NOTE — Telephone Encounter (Signed)
Outreach made.  No VM to leave message.  Sent [name].  Per Dr. Lovena Le- repeat blood work.  If creatinine remains greater than 2.0 have Pt stop Entresto.

## 2018-03-30 ENCOUNTER — Other Ambulatory Visit: Payer: BLUE CROSS/BLUE SHIELD

## 2018-03-30 ENCOUNTER — Other Ambulatory Visit: Payer: Self-pay

## 2018-03-30 DIAGNOSIS — I5022 Chronic systolic (congestive) heart failure: Secondary | ICD-10-CM

## 2018-03-30 NOTE — Progress Notes (Signed)
Ordered bmp per dr. Lovena Le

## 2018-03-30 NOTE — Telephone Encounter (Signed)
Scheduled for today for lab work.

## 2018-03-31 ENCOUNTER — Other Ambulatory Visit: Payer: BLUE CROSS/BLUE SHIELD

## 2018-04-03 ENCOUNTER — Other Ambulatory Visit: Payer: BLUE CROSS/BLUE SHIELD | Admitting: *Deleted

## 2018-04-03 DIAGNOSIS — I5022 Chronic systolic (congestive) heart failure: Secondary | ICD-10-CM

## 2018-04-04 LAB — BASIC METABOLIC PANEL
BUN / CREAT RATIO: 14 (ref 9–20)
BUN: 20 mg/dL (ref 6–24)
CO2: 23 mmol/L (ref 20–29)
CREATININE: 1.39 mg/dL — AB (ref 0.76–1.27)
Calcium: 9 mg/dL (ref 8.7–10.2)
Chloride: 105 mmol/L (ref 96–106)
GFR calc non Af Amer: 60 mL/min/{1.73_m2} (ref >59)
GFR, EST AFRICAN AMERICAN: 70 mL/min/{1.73_m2} (ref >59)
GLUCOSE: 102 mg/dL — AB (ref 65–99)
Potassium: 4.1 mmol/L (ref 3.5–5.2)
SODIUM: 142 mmol/L (ref 134–144)

## 2018-04-07 ENCOUNTER — Telehealth: Payer: Self-pay

## 2018-04-07 NOTE — Telephone Encounter (Signed)
Attempted to confirm remote transmission with pt. No answer and was unable to leave a message.  [facility] monitor has not updated in 7 days.

## 2018-04-14 ENCOUNTER — Telehealth: Payer: Self-pay | Admitting: Cardiology

## 2018-04-14 NOTE — Telephone Encounter (Signed)
Spoke w/ pt and requested that he send a manual transmission b/c his home monitor has not updated in at least 7 days.   

## 2018-04-17 MED FILL — ENTRESTO 24 MG-26 MG TABLET: 24-26 | 30 days supply | Qty: 60 | Fill #2

## 2018-04-18 ENCOUNTER — Telehealth: Payer: Self-pay | Admitting: Cardiology

## 2018-04-18 ENCOUNTER — Ambulatory Visit (INDEPENDENT_AMBULATORY_CARE_PROVIDER_SITE_OTHER): Payer: BLUE CROSS/BLUE SHIELD | Admitting: *Deleted

## 2018-04-18 DIAGNOSIS — I5022 Chronic systolic (congestive) heart failure: Secondary | ICD-10-CM

## 2018-04-18 DIAGNOSIS — I4901 Ventricular fibrillation: Secondary | ICD-10-CM

## 2018-04-18 NOTE — Telephone Encounter (Signed)
Spoke with pt and reminded pt of remote transmission that is due today. Pt verbalized understanding.   

## 2018-04-19 NOTE — Progress Notes (Signed)
Remote ICD transmission.   

## 2018-04-24 MED FILL — BISOPROLOL FUMARATE 10 MG T: 10 | 30 days supply | Qty: 30 | Fill #1

## 2018-04-26 ENCOUNTER — Encounter: Payer: Self-pay | Admitting: Cardiology

## 2018-05-01 MED FILL — AMIODARONE HCL 200 MG TAB: 200 | 30 days supply | Qty: 30 | Fill #1

## 2018-05-05 ENCOUNTER — Telehealth: Payer: Self-pay

## 2018-05-05 NOTE — Telephone Encounter (Signed)
LMOVM requesting that pt send manual transmission b/c home monitor has not updated in at least 7 days.    

## 2018-05-12 ENCOUNTER — Telehealth: Payer: Self-pay | Admitting: Cardiology

## 2018-05-12 NOTE — Telephone Encounter (Signed)
Spoke w/ pt and requested that he send a manual transmission b/c his home monitor has not updated in at least 7 days.   

## 2018-05-16 MED FILL — ENTRESTO 24 MG-26 MG TABLET: 24-26 | 30 days supply | Qty: 60 | Fill #3

## 2018-05-23 MED FILL — BISOPROLOL FUMARATE 10 MG T: 10 | 30 days supply | Qty: 30 | Fill #2

## 2018-05-31 MED FILL — AMIODARONE HCL 200 MG TAB: 200 | 30 days supply | Qty: 30 | Fill #2

## 2018-06-12 ENCOUNTER — Telehealth: Payer: Self-pay | Admitting: Internal Medicine

## 2018-06-12 NOTE — Telephone Encounter (Signed)
New Message          Patient is calling to see if he needs to do blood work on Wednesday? Pls call and advise

## 2018-06-12 NOTE — Telephone Encounter (Signed)
Returned call to [name].  Advised [name] that Dr. Lovena Le would decide if he wants lab work after he sees him on Wednesday.  [name] indicates understanding.

## 2018-06-13 ENCOUNTER — Ambulatory Visit: Payer: BLUE CROSS/BLUE SHIELD | Attending: Family Medicine | Admitting: Family Medicine

## 2018-06-13 ENCOUNTER — Encounter: Payer: Self-pay | Admitting: Family Medicine

## 2018-06-13 VITALS — BP 99/59 | HR 45 | Temp 98.4°F | Resp 18 | Ht 74.0 in | Wt 235.0 lb

## 2018-06-13 DIAGNOSIS — J4520 Mild intermittent asthma, uncomplicated: Secondary | ICD-10-CM

## 2018-06-13 DIAGNOSIS — Z833 Family history of diabetes mellitus: Secondary | ICD-10-CM | POA: Insufficient documentation

## 2018-06-13 DIAGNOSIS — I420 Dilated cardiomyopathy: Secondary | ICD-10-CM | POA: Insufficient documentation

## 2018-06-13 DIAGNOSIS — Z87891 Personal history of nicotine dependence: Secondary | ICD-10-CM | POA: Insufficient documentation

## 2018-06-13 DIAGNOSIS — I10 Essential (primary) hypertension: Secondary | ICD-10-CM | POA: Diagnosis not present

## 2018-06-13 DIAGNOSIS — I5022 Chronic systolic (congestive) heart failure: Secondary | ICD-10-CM | POA: Diagnosis not present

## 2018-06-13 DIAGNOSIS — Z8674 Personal history of sudden cardiac arrest: Secondary | ICD-10-CM | POA: Insufficient documentation

## 2018-06-13 DIAGNOSIS — Z87442 Personal history of urinary calculi: Secondary | ICD-10-CM

## 2018-06-13 DIAGNOSIS — E785 Hyperlipidemia, unspecified: Secondary | ICD-10-CM | POA: Insufficient documentation

## 2018-06-13 DIAGNOSIS — I110 Hypertensive heart disease with heart failure: Secondary | ICD-10-CM | POA: Insufficient documentation

## 2018-06-13 DIAGNOSIS — I4901 Ventricular fibrillation: Secondary | ICD-10-CM

## 2018-06-13 DIAGNOSIS — Z95810 Presence of automatic (implantable) cardiac defibrillator: Secondary | ICD-10-CM

## 2018-06-13 DIAGNOSIS — Z7982 Long term (current) use of aspirin: Secondary | ICD-10-CM | POA: Insufficient documentation

## 2018-06-13 DIAGNOSIS — R002 Palpitations: Secondary | ICD-10-CM | POA: Insufficient documentation

## 2018-06-13 DIAGNOSIS — Z8249 Family history of ischemic heart disease and other diseases of the circulatory system: Secondary | ICD-10-CM | POA: Insufficient documentation

## 2018-06-13 DIAGNOSIS — Z23 Encounter for immunization: Secondary | ICD-10-CM

## 2018-06-13 MED ORDER — BUDESONIDE-FORMOTEROL FUMARATE 80-4.5 MCG/ACT IN AERO: 2.0000 | INHALATION_SPRAY | Freq: Two times a day (BID) | RESPIRATORY_TRACT | 6 refills | Status: DC

## 2018-06-13 MED ORDER — SACUBITRIL-VALSARTAN 24-26 MG PO TABS: 1.0000 | ORAL_TABLET | Freq: Two times a day (BID) | ORAL | 11 refills | Status: DC

## 2018-06-13 MED FILL — ENTRESTO 24 MG-26 MG TABLET: 24-26 | 30 days supply | Qty: 60 | Fill #4

## 2018-06-13 NOTE — Progress Notes (Signed)
Subjective:    Patient ID: [name], male    DOB: [birth year], 47 y.o.   MRN: [id number]  HPI        47 year old male with a history of chronic systolic heart failure, history of cardiac arrest, history of V. tach status post defibrillator placement who was last seen in the office on 02/10/2017 in follow-up of mild intermittent asthma as well as other chronic medical issues.  Patient reports that he has been following up with his cardiologist.  Patient states that his defibrillator has not discharged since September 2018.  Patient denies any chest pain but does have sensation of palpitations.  Patient also is status post emergency department visit in July secondary to a kidney stone.  Patient reports that he did see a specialist and patient was given medication which helped him to pass the kidney stone.  Patient however sometimes feels a mild vibration in his left mid back as if there is something present.  Patient also reports that he was having issues earlier this year with an elevation in his creatinine which was as high as 2.0 but did decrease.  Patient reports that he has occasional mild shortness of breath but no wheezing.  Patient would like to receive a refill of Symbicort at today's visit.  Patient also needs refill of Entresto for hypertension and heart failure.  Patient states that overall he feels that he is doing well.  Patient does have appointment tomorrow with his urologist but would like to go ahead and have his urine checked at today's visit as well as blood work and follow-up of his prior elevated creatinine.  Patient denies any urinary frequency or dysuria.  Patient is seen no visible blood in the urine.  Patient has had no increase in mid back pain.      Patient reports that his only known drug allergy/intolerance has been ACE inhibitors which caused him to have a cough.  Patient reports surgical history of defibrillator placement and surgery for kidney stones.  Patient reports family  history significant for father with heart disease and paternal grandfather with diabetes.  Patient reports that he does not currently smoke but did smoke as a teenager.  Patient does not drink alcohol.  Patient is married and works in Sales executive.  Past Medical History:  Diagnosis Date  . CHF (congestive heart failure) (HCC)    chronic  . DCM (dilated cardiomyopathy) ([place])   . Dyslipidemia   . HTN (hypertension)   . Hx of ventricular fibrillation    Hx of VFib arrest s/p AICD placement  . LV dysfunction    with an EF in 2007 at 34%   Past Surgical History:  Procedure Laterality Date  . CARDIAC DEFIBRILLATOR PLACEMENT    . repeat echo  2008    demonstrated near normalization  . TRANSTHORACIC ECHOCARDIOGRAM  11/09/2005, 01/15/2009, 02/07/2010   Family History  Problem Relation Age of Onset  . Diabetes Father   . Heart Problems Father        unsure of what kind  . Asthma Maternal Uncle   . Diabetes Maternal Grandmother    Social History   Tobacco Use  . Smoking status: Former Smoker    Packs/day: 0.25    Years: 2.00    Pack years: 0.50    Types: Cigarettes    Last attempt to quit: 07/12/1998    Years since quitting: 19.9  . Smokeless tobacco: Never Used  Substance Use Topics  . Alcohol use: No  Comment: Denies  . Drug use: No   Allergies  Allergen Reactions  . Ace Inhibitors Cough   Current Outpatient Medications on File Prior to Visit  Medication Sig Dispense Refill  . amiodarone (PACERONE) 200 MG tablet Take 1 tablet (200 mg total) by mouth daily. 90 tablet 1  . aspirin 81 MG tablet Take 1 tablet (81 mg total) by mouth 2 (two) times daily. (Patient taking differently: Take 81 mg by mouth See admin instructions. Take one tablet (81 mg) by mouth daily at bedtime, may also take one tablet during the day if feeling tired) 30 tablet 11  . bisoprolol (ZEBETA) 10 MG tablet TAKE 1 TABLET BY MOUTH DAILY. 30 tablet 6  . cetirizine (ZYRTEC ALLERGY) 10 MG tablet Take 1  tablet (10 mg total) by mouth daily. 30 tablet 11  . ibuprofen (ADVIL,MOTRIN) 200 MG tablet Take 200 mg by mouth every 6 (six) hours as needed for headache (pain).    . tamsulosin (FLOMAX) 0.4 MG CAPS capsule Take 1 capsule (0.4 mg total) by mouth daily. 10 capsule 0   No current facility-administered medications on file prior to visit.    BMP Latest Ref Rng & Units 04/03/2018 01/21/2018 01/21/2018  Glucose 65 - 99 mg/dL 102(H) 106(H) 105(H)  BUN 6 - 24 mg/dL 20 18 21(H)  Creatinine 0.76 - 1.27 mg/dL 1.39(H) 2.04(H) 2.00(H)  BUN/Creat Ratio 9 - 20 14 - -  Sodium 134 - 144 mmol/L 142 138 139  Potassium 3.5 - 5.2 mmol/L 4.1 4.2 4.5  Chloride 96 - 106 mmol/L 105 103 103  CO2 20 - 29 mmol/L 23 27 -  Calcium 8.7 - 10.2 mg/dL 9.0 9.1 -      Review of Systems  Constitutional: Negative for chills, fatigue and fever.  HENT: Negative for sore throat and trouble swallowing.   Respiratory: Negative for cough, shortness of breath and wheezing.   Gastrointestinal: Negative for abdominal pain, blood in stool and nausea.  Genitourinary: Negative for dysuria, frequency and hematuria.  Musculoskeletal: Negative for arthralgias and back pain.  Neurological: Negative for dizziness and headaches.  Hematological: Negative for adenopathy. Does not bruise/bleed easily.       Objective:   Physical Exam BP (!) 99/59 (BP Location: Left Arm, Patient Position: Sitting, Cuff Size: Large)   Pulse (!) 45   Temp 98.4 F (36.9 C) (Oral)   Resp 18   Ht 6\' 2"  (1.88 m)   Wt 235 lb (106.6 kg)   SpO2 98%   BMI 30.17 kg/m Nurse's notes and vital signs reviewed General-well-nourished, well-developed male in no acute distress. Neck-supple, no lymphadenopathy, no thyromegaly, no carotid bruit Lungs-clear to auscultation bilaterally Cardiovascular-regular rate and rhythm Abdomen-mild truncal obesity, soft and nontender Back-no CVA tenderness Extremities-no edema       Assessment & Plan:  1. Chronic systolic  heart failure (HCC) Patient's chronic systolic heart failure appears to be stable.  Patient denies any orthopnea, patient does not have any peripheral edema and no rales on exam.  No increased fatigue or shortness of breath.  Patient will have BMP in follow-up of heart failure and medications for treatment.  Patient is provided with refill of Entresto. - Basic Metabolic Panel - sacubitril-valsartan (ENTRESTO) 24-26 MG; Take 1 tablet by mouth 2 (two) times daily.  Dispense: 60 tablet; Refill: 11  2. Ventricular fibrillation [facility]) Patient has an implantable defibrillator.  Patient reports that he still has some sensation at times of palpitations and has been placed back on amiodarone  by his cardiologist. - sacubitril-valsartan (ENTRESTO) 24-26 MG; Take 1 tablet by mouth 2 (two) times daily.  Dispense: 60 tablet; Refill: 11  3. Essential hypertension Patient's blood pressure is slightly low at today's visit.  Patient will have urinalysis to look for proteinuria.  Patient will also have BMP in follow-up of renal function.  Patient with asymptomatic hypotension and bradycardia of which his cardiologist is aware and patient is to continue his current medications.  Return for fasting lipid panel. - POCT URINALYSIS DIP (CLINITEK) - Basic Metabolic Panel - sacubitril-valsartan (ENTRESTO) 24-26 MG; Take 1 tablet by mouth 2 (two) times daily.  Dispense: 60 tablet; Refill: 11  4. History of kidney stones Patient with a history of kidney stones.  Will check urinalysis for hematuria or signs of infection.  Patient reports that he does have follow-up tomorrow with urology.  Patient did not have any CVA tenderness or abdominal tenderness on exam - POCT URINALYSIS DIP (CLINITEK)  5. Mild intermittent asthma without complication Patient with mild intermittent asthma and patient is provided with a refill of Symbicort - budesonide-formoterol (SYMBICORT) 80-4.5 MCG/ACT inhaler; Inhale 2 puffs into the lungs 2 (two)  times daily.  Dispense: 1 Inhaler; Refill: 6  6. Need for immunization against influenza Patient was offered and agreed to have influenza immunization done at today's visit.  7.  Automatic implantable cardioverter-defibrillator in situ Patient with defibrillator status post history of cardiac arrest due to cardiac arrhythmia.  Patient reports that he has had no discharge of his defibrillator since September 2018.  Patient will continue regular follow-up with cardiology.  An After Visit Summary was printed and given to the patient.  Schedule fasting well exam; 4 to 79-month office follow-up of chronic medical issues, sooner if today's labs are abnormal

## 2018-06-14 ENCOUNTER — Encounter: Payer: Self-pay | Admitting: Internal Medicine

## 2018-06-14 ENCOUNTER — Ambulatory Visit (INDEPENDENT_AMBULATORY_CARE_PROVIDER_SITE_OTHER): Payer: BLUE CROSS/BLUE SHIELD | Admitting: Internal Medicine

## 2018-06-14 VITALS — BP 118/56 | HR 50 | Ht 74.0 in | Wt 234.0 lb

## 2018-06-14 DIAGNOSIS — I4901 Ventricular fibrillation: Secondary | ICD-10-CM

## 2018-06-14 DIAGNOSIS — I5022 Chronic systolic (congestive) heart failure: Secondary | ICD-10-CM

## 2018-06-14 DIAGNOSIS — Z4502 Encounter for adjustment and management of automatic implantable cardiac defibrillator: Secondary | ICD-10-CM | POA: Diagnosis not present

## 2018-06-14 LAB — BASIC METABOLIC PANEL WITH GFR
BUN/Creatinine Ratio: 13 (ref 9–20)
BUN: 17 mg/dL (ref 6–24)
CO2: 24 mmol/L (ref 20–29)
Calcium: 9 mg/dL (ref 8.7–10.2)
Chloride: 103 mmol/L (ref 96–106)
Creatinine, Ser: 1.3 mg/dL — ABNORMAL HIGH (ref 0.76–1.27)
GFR calc Af Amer: 76 mL/min/1.73
GFR calc non Af Amer: 65 mL/min/1.73
Glucose: 114 mg/dL — ABNORMAL HIGH (ref 65–99)
Potassium: 4.3 mmol/L (ref 3.5–5.2)
Sodium: 142 mmol/L (ref 134–144)

## 2018-06-14 MED FILL — SYMBICORT 80-4.5 MCG INH: 80-4.5 | 30 days supply | Qty: 10 | Fill #0

## 2018-06-14 NOTE — Patient Instructions (Signed)
Medication Instructions:  Your physician recommends that you continue on your current medications as directed. Please refer to the Current Medication list given to you today.  Labwork: None ordered.  Testing/Procedures: None ordered.  Follow-Up: Your physician wants you to follow-up in: one year with Dr. Lovena Le.   You will receive a reminder letter in the mail two months in advance. If you don't receive a letter, please call our office to schedule the follow-up appointment.  Remote monitoring is used to monitor your ICD from home. This monitoring reduces the number of office visits required to check your device to one time per year. It allows [place] to keep an eye on the functioning of your device to ensure it is working properly. You are scheduled for a device check from home on 07/18/2018. You may send your transmission at any time that day. If you have a wireless device, the transmission will be sent automatically. After your physician reviews your transmission, you will receive a postcard with your next transmission date.  Any Other Special Instructions Will Be Listed Below (If Applicable).  If you need a refill on your cardiac medications before your next appointment, please call your pharmacy.

## 2018-06-14 NOTE — Progress Notes (Signed)
HPI [name] returns today for ongoing evaluation and management of his ICD, VT/VF, and chronic systolic heart failure. In the interim, he has had no ICD shocks and denies chest pain. He has class 2 CHF. He notes fatigue and weakness. His sob has improved on Entresto but he is having some problems with the cost. No syncope or chest pain. He denies peripheral edema. His biggest complaint today is related to ED and he also notes palpitations.  Allergies  Allergen Reactions  . Ace Inhibitors Cough     Current Outpatient Medications  Medication Sig Dispense Refill  . aspirin 81 MG tablet Take 1 tablet (81 mg total) by mouth 2 (two) times daily. 30 tablet 11  . amiodarone (PACERONE) 200 MG tablet Take 1 tablet (200 mg total) by mouth daily. 90 tablet 1  . bisoprolol (ZEBETA) 10 MG tablet TAKE 1 TABLET BY MOUTH DAILY. 30 tablet 6  . budesonide-formoterol (SYMBICORT) 80-4.5 MCG/ACT inhaler Inhale 2 puffs into the lungs 2 (two) times daily. (Patient not taking: Reported on 06/14/2018) 1 Inhaler 6  . sacubitril-valsartan (ENTRESTO) 24-26 MG Take 1 tablet by mouth 2 (two) times daily. 60 tablet 11   No current facility-administered medications for this visit.      Past Medical History:  Diagnosis Date  . CHF (congestive heart failure) (HCC)    chronic  . DCM (dilated cardiomyopathy) ([place])   . Dyslipidemia   . HTN (hypertension)   . Hx of ventricular fibrillation    Hx of VFib arrest s/p AICD placement  . LV dysfunction    with an EF in 2007 at 34%    ROS:   All systems reviewed and negative except as noted in the HPI.   Past Surgical History:  Procedure Laterality Date  . CARDIAC DEFIBRILLATOR PLACEMENT    . repeat echo  2008    demonstrated near normalization  . TRANSTHORACIC ECHOCARDIOGRAM  11/09/2005, 01/15/2009, 02/07/2010     Family History  Problem Relation Age of Onset  . Diabetes Father   . Heart Problems Father        unsure of what kind  . Asthma Maternal  Uncle   . Diabetes Maternal Grandmother      Social History   Socioeconomic History  . Marital status: Married    Spouse name: Not on file  . Number of children: Not on file  . Years of education: Not on file  . Highest education level: Not on file  Occupational History    Employer: [employer]  Social Needs  . Financial resource strain: Not on file  . Food insecurity:    Worry: Not on file    Inability: Not on file  . Transportation needs:    Medical: Not on file    Non-medical: Not on file  Tobacco Use  . Smoking status: Former Smoker    Packs/day: 0.25    Years: 2.00    Pack years: 0.50    Types: Cigarettes    Last attempt to quit: 07/12/1998    Years since quitting: 19.9  . Smokeless tobacco: Never Used  Substance and Sexual Activity  . Alcohol use: No    Comment: Denies  . Drug use: No  . Sexual activity: Not on file  Lifestyle  . Physical activity:    Days per week: Not on file    Minutes per session: Not on file  . Stress: Not on file  Relationships  . Social connections:  Talks on phone: Not on file    Gets together: Not on file    Attends religious service: Not on file    Active member of club or organization: Not on file    Attends meetings of clubs or organizations: Not on file    Relationship status: Not on file  . Intimate partner violence:    Fear of current or ex partner: Not on file    Emotionally abused: Not on file    Physically abused: Not on file    Forced sexual activity: Not on file  Other Topics Concern  . Not on file  Social History Narrative   The patient is married.  He is in native of [place],     living in [place] for 9 years.  He has a history of tobacco use,     but quit smoking a year ago.  Denies alcohol abuse.           BP (!) 118/56   Pulse (!) 50   Ht 6\' 2"  (1.88 m)   Wt 234 lb (106.1 kg)   BMI 30.04 kg/m   Physical Exam:  Well appearing NAD HEENT: Unremarkable Neck:  No JVD, no thyromegally Lymphatics:  No  adenopathy Back:  No CVA tenderness Lungs:  Clear with no wheezes HEART:  Regular rate rhythm, no murmurs, no rubs, no clicks Abd:  soft, positive bowel sounds, no organomegally, no rebound, no guarding Ext:  2 plus pulses, no edema, no cyanosis, no clubbing Skin:  No rashes no nodules Neuro:  CN II through XII intact, motor grossly intact  EKG - sinus bradycardia  DEVICE  Normal device function.  See PaceArt for details.   Assess/Plan: 1. VF - he has had no recurrent ICD therapies. He will continue his current dose of amiodarone.  2. Chronic systolic heart failure - his symptoms are class 2. He will continue his current meds. 3. ICD - his [employer] single chamber ICD appears to be working normally.  4. ED - I have offered him a script for sildanefil. He can take 40 to 60 mg as needed for ED.  Mikle [email or web address].D.

## 2018-06-15 LAB — CUP PACEART INCLINIC DEVICE CHECK
Brady Statistic RV Percent Paced: 0.88 %
Date Time Interrogation Session: 20191204223236
HighPow Impedance: 50.3936
Implantable Lead Implant Date: 20110805
Implantable Lead Location: 753860
Implantable Lead Model: 185
Implantable Lead Serial Number: 39993
Lead Channel Pacing Threshold Pulse Width: 0.4 ms
Lead Channel Sensing Intrinsic Amplitude: 11.7 mV
Lead Channel Setting Sensing Sensitivity: 0.5 mV
MDC IDC MSMT BATTERY REMAINING LONGEVITY: 38 mo
MDC IDC MSMT LEADCHNL RV IMPEDANCE VALUE: 400 Ohm
MDC IDC MSMT LEADCHNL RV PACING THRESHOLD AMPLITUDE: 0.75 V
MDC IDC PG IMPLANT DT: 20110805
MDC IDC PG SERIAL: 728671
MDC IDC SET LEADCHNL RV PACING AMPLITUDE: 2.5 V
MDC IDC SET LEADCHNL RV PACING PULSEWIDTH: 0.4 ms

## 2018-06-20 MED FILL — BISOPROLOL FUMARATE 10 MG T: 10 | 30 days supply | Qty: 30 | Fill #3

## 2018-06-30 LAB — CUP PACEART REMOTE DEVICE CHECK
Battery Remaining Percentage: 31 %
Battery Voltage: 2.86 V
Date Time Interrogation Session: 20191008205637
HIGH POWER IMPEDANCE MEASURED VALUE: 54 Ohm
Implantable Lead Serial Number: 39993
Lead Channel Impedance Value: 400 Ohm
Lead Channel Pacing Threshold Pulse Width: 0.4 ms
Lead Channel Sensing Intrinsic Amplitude: 11.7 mV
Lead Channel Setting Pacing Amplitude: 2.5 V
MDC IDC LEAD IMPLANT DT: 20110805
MDC IDC LEAD LOCATION: 753860
MDC IDC MSMT BATTERY REMAINING LONGEVITY: 37 mo
MDC IDC MSMT LEADCHNL RV PACING THRESHOLD AMPLITUDE: 0.75 V
MDC IDC PG IMPLANT DT: 20110805
MDC IDC SET LEADCHNL RV PACING PULSEWIDTH: 0.4 ms
MDC IDC SET LEADCHNL RV SENSING SENSITIVITY: 0.5 mV
MDC IDC STAT BRADY RV PERCENT PACED: 1 %
Pulse Gen Serial Number: 728671

## 2018-07-03 ENCOUNTER — Telehealth: Payer: Self-pay | Admitting: *Deleted

## 2018-07-03 MED FILL — AMIODARONE HCL 200 MG TAB: 200 | 30 days supply | Qty: 30 | Fill #3

## 2018-07-03 NOTE — Telephone Encounter (Signed)
Patient verified DOB Patient is aware of creatinine improving from July and august. Patient advised to continue with current medications. No further questions.

## 2018-07-03 NOTE — Telephone Encounter (Signed)
-----   Message from Antony Blackbird, MD sent at 06/28/2018  9:48 AM EST ----- Please let patient know that he had a continued mild increase in his creatinine which is a measurement of kidney function at 1.30 with normal being 0.76-1.27.  Creatinine is improved from 01/21/2018 when his value was 2.04 and on 04/03/2018, value was 1.39.  Patient should continue to remain well-hydrated, make sure that his blood pressure is controlled and avoid the use of nonsteroidal anti-inflammatories

## 2018-07-11 MED FILL — ENTRESTO 24 MG-26 MG TABLET: 24-26 | 30 days supply | Qty: 60 | Fill #0

## 2018-07-14 MED FILL — BISOPROLOL FUMARATE 10 MG T: 10 | 30 days supply | Qty: 30 | Fill #4

## 2018-07-14 MED FILL — !SYMBICORT 80-4.5 MCG INH: 80-4.5 | 30 days supply | Qty: 1 | Fill #1

## 2018-07-18 ENCOUNTER — Ambulatory Visit (INDEPENDENT_AMBULATORY_CARE_PROVIDER_SITE_OTHER): Payer: Self-pay

## 2018-07-18 DIAGNOSIS — I5022 Chronic systolic (congestive) heart failure: Secondary | ICD-10-CM

## 2018-07-18 DIAGNOSIS — I4901 Ventricular fibrillation: Secondary | ICD-10-CM

## 2018-07-19 LAB — CUP PACEART REMOTE DEVICE CHECK
Battery Voltage: 2.84 V
Date Time Interrogation Session: 20200107132432
HIGH POWER IMPEDANCE MEASURED VALUE: 51 Ohm
Implantable Lead Implant Date: 20110805
Implantable Lead Serial Number: 39993
Implantable Pulse Generator Implant Date: 20110805
Lead Channel Impedance Value: 410 Ohm
Lead Channel Pacing Threshold Amplitude: 0.75 V
Lead Channel Sensing Intrinsic Amplitude: 11.9 mV
Lead Channel Setting Sensing Sensitivity: 0.5 mV
MDC IDC LEAD LOCATION: 753860
MDC IDC MSMT BATTERY REMAINING LONGEVITY: 35 mo
MDC IDC MSMT BATTERY REMAINING PERCENTAGE: 29 %
MDC IDC MSMT LEADCHNL RV PACING THRESHOLD PULSEWIDTH: 0.4 ms
MDC IDC SET LEADCHNL RV PACING AMPLITUDE: 2.5 V
MDC IDC SET LEADCHNL RV PACING PULSEWIDTH: 0.4 ms
MDC IDC STAT BRADY RV PERCENT PACED: 4 %
Pulse Gen Serial Number: 728671

## 2018-07-19 NOTE — Progress Notes (Signed)
Remote ICD transmission.   

## 2018-07-31 MED FILL — AMIODARONE HCL 200 MG TAB: 200 | 30 days supply | Qty: 30 | Fill #4

## 2018-08-10 MED FILL — ENTRESTO 24 MG-26 MG TABLET: 24-26 | 30 days supply | Qty: 60 | Fill #1

## 2018-08-21 MED FILL — BISOPROLOL FUMARATE 10 MG T: 10 | 30 days supply | Qty: 30 | Fill #5

## 2018-08-28 MED FILL — AMIODARONE HCL 200 MG TAB: 200 | 30 days supply | Qty: 30 | Fill #5

## 2018-09-08 MED FILL — ENTRESTO 24 MG-26 MG TABLET: 24-26 | 30 days supply | Qty: 60 | Fill #2

## 2018-09-18 MED FILL — BISOPROLOL FUMARATE 10 MG T: 10 | 30 days supply | Qty: 30 | Fill #6

## 2018-09-27 ENCOUNTER — Ambulatory Visit: Payer: Self-pay | Admitting: Family Medicine

## 2018-09-29 ENCOUNTER — Other Ambulatory Visit: Payer: Self-pay | Admitting: Internal Medicine

## 2018-09-29 DIAGNOSIS — I10 Essential (primary) hypertension: Secondary | ICD-10-CM

## 2018-09-29 DIAGNOSIS — I5022 Chronic systolic (congestive) heart failure: Secondary | ICD-10-CM

## 2018-10-04 MED FILL — BISOPROLOL FUMARATE 10 MG T: 10 | 30 days supply | Qty: 30 | Fill #0

## 2018-10-04 MED FILL — ENTRESTO 24 MG-26 MG TABLET: 24-26 | 30 days supply | Qty: 60 | Fill #3

## 2018-10-04 MED FILL — SYMBICORT 80-4.5 MCG INH: 80-4.5 | 30 days supply | Qty: 10 | Fill #2

## 2018-10-17 ENCOUNTER — Ambulatory Visit (INDEPENDENT_AMBULATORY_CARE_PROVIDER_SITE_OTHER): Payer: Self-pay | Admitting: *Deleted

## 2018-10-17 ENCOUNTER — Other Ambulatory Visit: Payer: Self-pay

## 2018-10-17 ENCOUNTER — Telehealth: Payer: Self-pay

## 2018-10-17 DIAGNOSIS — I4901 Ventricular fibrillation: Secondary | ICD-10-CM

## 2018-10-17 LAB — CUP PACEART REMOTE DEVICE CHECK
Battery Remaining Longevity: 32 mo
Battery Remaining Percentage: 27 %
Battery Voltage: 2.83 V
Brady Statistic RV Percent Paced: 3.4 %
Date Time Interrogation Session: 20200407170118
HighPow Impedance: 52 Ohm
Implantable Lead Implant Date: 20110805
Implantable Lead Location: 753860
Implantable Lead Model: 185
Implantable Lead Serial Number: 39993
Implantable Pulse Generator Implant Date: 20110805
Lead Channel Impedance Value: 410 Ohm
Lead Channel Pacing Threshold Amplitude: 0.75 V
Lead Channel Pacing Threshold Pulse Width: 0.4 ms
Lead Channel Sensing Intrinsic Amplitude: 11.7 mV
Lead Channel Setting Pacing Amplitude: 2.5 V
Lead Channel Setting Pacing Pulse Width: 0.4 ms
Lead Channel Setting Sensing Sensitivity: 0.5 mV
Pulse Gen Serial Number: 728671

## 2018-10-25 ENCOUNTER — Encounter: Payer: Self-pay | Admitting: Cardiology

## 2018-10-25 NOTE — Progress Notes (Signed)
Remote ICD transmission.   

## 2018-10-30 ENCOUNTER — Ambulatory Visit: Payer: Managed Care, Other (non HMO) | Attending: Family Medicine | Admitting: Family Medicine

## 2018-10-30 ENCOUNTER — Other Ambulatory Visit: Payer: Self-pay

## 2018-10-30 ENCOUNTER — Encounter: Payer: Self-pay | Admitting: Family Medicine

## 2018-10-30 DIAGNOSIS — M25562 Pain in left knee: Secondary | ICD-10-CM | POA: Diagnosis not present

## 2018-10-30 DIAGNOSIS — I10 Essential (primary) hypertension: Secondary | ICD-10-CM | POA: Diagnosis not present

## 2018-10-30 DIAGNOSIS — J4520 Mild intermittent asthma, uncomplicated: Secondary | ICD-10-CM

## 2018-10-30 DIAGNOSIS — I4901 Ventricular fibrillation: Secondary | ICD-10-CM

## 2018-10-30 DIAGNOSIS — Z79899 Other long term (current) drug therapy: Secondary | ICD-10-CM

## 2018-10-30 DIAGNOSIS — I5022 Chronic systolic (congestive) heart failure: Secondary | ICD-10-CM | POA: Diagnosis not present

## 2018-10-30 DIAGNOSIS — E785 Hyperlipidemia, unspecified: Secondary | ICD-10-CM

## 2018-10-30 MED ORDER — SACUBITRIL-VALSARTAN 24-26 MG PO TABS: 1.0000 | ORAL_TABLET | Freq: Two times a day (BID) | ORAL | 11 refills | Status: DC

## 2018-10-30 MED ORDER — BISOPROLOL FUMARATE 10 MG PO TABS: 10.0000 mg | ORAL_TABLET | Freq: Every day | ORAL | 1 refills | Status: DC

## 2018-10-30 MED ORDER — AMIODARONE HCL 200 MG PO TABS: 200.0000 mg | ORAL_TABLET | Freq: Every day | ORAL | 1 refills | Status: DC

## 2018-10-30 MED ORDER — BUDESONIDE-FORMOTEROL FUMARATE 80-4.5 MCG/ACT IN AERO: 2.0000 | INHALATION_SPRAY | Freq: Two times a day (BID) | RESPIRATORY_TRACT | 1 refills | Status: DC

## 2018-10-30 MED ORDER — DICLOFENAC SODIUM 1 % TD GEL: 4.0000 g | Freq: Four times a day (QID) | TRANSDERMAL | 4 refills | Status: DC

## 2018-10-30 MED FILL — ENTRESTO 24 MG-26 MG TABLET: 24-26 | 30 days supply | Qty: 60 | Fill #0

## 2018-10-30 MED FILL — AMIODARONE HCL 200 MG TAB: 200 | 30 days supply | Qty: 30 | Fill #0

## 2018-10-30 MED FILL — BISOPROLOL FUMARATE 10 MG T: 10 | 30 days supply | Qty: 30 | Fill #0

## 2018-10-30 MED FILL — DICLOFENAC SODIUM 1% GEL: 1 | 12 days supply | Qty: 100 | Fill #0

## 2018-10-30 MED FILL — SYMBICORT 80-4.5 MCG INH: 80-4.5 | 30 days supply | Qty: 10 | Fill #0

## 2018-10-30 NOTE — Progress Notes (Signed)
Left knee and want to check his routine lab check   Next week he's suppose to start fasting for his religion and just want to make sure he's okay before fasting

## 2018-10-30 NOTE — Progress Notes (Signed)
Virtual Visit via Telephone Note  I connected with [name] on 10/30/18 at  4:10 PM EDT by telephone and verified that I am speaking with the correct person using two identifiers.   I discussed the limitations, risks, security and privacy concerns of performing an evaluation and management service by telephone and the availability of in person appointments. I also discussed with the patient that there may be a patient responsible charge related to this service. The patient expressed understanding and agreed to proceed.  Patient location: Home Provider location: Office Call was initiated by Emilio Aspen, CMA   History of Present Illness:      48 yo male with multiple chronic medical issues including hypertension, CHF, dilated cardiomyopathy, dyslipidemia, mild intermittent asthma and history of ventricular fibrillation who is seen in follow-up for continued medical management and patient with questions about managing his health issues while fasting for Ramadan which starts next week. He feels that last year when he fasted this contributed to his renal function (creatinine) being elevated. (He had also been taking NSAID's for pain).  Patient reports no recent use of nonsteroidal anti-inflammatories but he has had some recent pain in his left knee.      Patient states that he will be able to eat and drink fluids from Sunset to sunrise.  Patient feels that his current medical issues are well controlled at this time.  Patient has had no chest pain and no sensation of palpitations.  Patient does have a defibrillator but has had no discharge of the device.  Patient denies any shortness of breath and no increased peripheral edema.  Patient continues to be compliant with all of his medications.  Patient states that his recent knee pain is intermittent and he recalls no injury.  Pain at its greatest is about a 6 on a 0-to-10 scale and he does not feel as if he has any swelling in his knee.  Patient with  history of kidney stones but denies any current urinary symptoms and no back or abdominal pain.  He reports no episodes of shortness of breath or wheezing, no chest tightness and no shortness of breath with activity.  Patient feels that his asthma is well controlled with the use of Symbicort.  Past Medical History:  Diagnosis Date  . CHF (congestive heart failure) (HCC)    chronic  . DCM (dilated cardiomyopathy) ([place])   . Dyslipidemia   . HTN (hypertension)   . Hx of ventricular fibrillation    Hx of VFib arrest s/p AICD placement  . LV dysfunction    with an EF in 2007 at 34%   Past Surgical History:  Procedure Laterality Date  . CARDIAC DEFIBRILLATOR PLACEMENT    . repeat echo  2008    demonstrated near normalization  . TRANSTHORACIC ECHOCARDIOGRAM  11/09/2005, 01/15/2009, 02/07/2010   Family History  Problem Relation Age of Onset  . Diabetes Father   . Heart Problems Father        unsure of what kind  . Asthma Maternal Uncle   . Diabetes Maternal Grandmother    Social History   Tobacco Use  . Smoking status: Former Smoker    Packs/day: 0.25    Years: 2.00    Pack years: 0.50    Types: Cigarettes    Last attempt to quit: 07/12/1998    Years since quitting: 20.3  . Smokeless tobacco: Never Used  Substance Use Topics  . Alcohol use: No    Comment: Denies  .  Drug use: No   Allergies  Allergen Reactions  . Ace Inhibitors Cough   Review of Systems  Constitutional: Negative for chills, fever and malaise/fatigue.  HENT: Negative for congestion and sore throat.   Respiratory: Negative for cough and shortness of breath.   Cardiovascular: Negative for chest pain, palpitations and leg swelling.  Gastrointestinal: Negative for abdominal pain, constipation, diarrhea, heartburn, nausea and vomiting.  Genitourinary: Negative for dysuria and frequency.  Musculoskeletal: Positive for joint pain. Negative for myalgias.  Skin: Negative for itching and rash.  Neurological:  Negative for dizziness and headaches.  Endo/Heme/Allergies: Negative for polydipsia. Does not bruise/bleed easily.     Observations/Objective: No vital signs or physical exam done at today's visit as visit was contacted via telephone due to  Restrictions/limitations on-office visits due to current COVID-19 pandemic  Assessment and Plan: 1. Chronic systolic heart failure (HCC) Patient with chronic systolic heart failure that is stable at this time.  Patient is provided with refills for continued treatment and control of symptoms.  Patient will continue cardiology follow-up.  Patient will return to clinic for future labs including CMP once it is safe for him to do so in light of the COVID-19 pandemic. - sacubitril-valsartan (ENTRESTO) 24-26 MG; Take 1 tablet by mouth 2 (two) times daily.  Dispense: 60 tablet; Refill: 11 - bisoprolol (ZEBETA) 10 MG tablet; Take 1 tablet (10 mg total) by mouth daily.  Dispense: 90 tablet; Refill: 1 - amiodarone (PACERONE) 200 MG tablet; Take 1 tablet (200 mg total) by mouth daily.  Dispense: 90 tablet; Refill: 1 - Hemoglobin A1c; Future - Comprehensive metabolic panel; Future  2. Dilated Cardiomyopathy; history of ventricular fibrillation Patient with dilated cardiomyopathy with history of ventricular fibrillation and patient is status post defibrillator placement.  Patient has had interrogation of the device recently by cardiology.  Patient provided with refills of Entresto and Pacerone.  Patient is encouraged to remain compliant with medications and low-sodium diet and regular monitoring of blood pressure. - sacubitril-valsartan (ENTRESTO) 24-26 MG; Take 1 tablet by mouth 2 (two) times daily.  Dispense: 60 tablet; Refill: 11 - amiodarone (PACERONE) 200 MG tablet; Take 1 tablet (200 mg total) by mouth daily.  Dispense: 90 tablet; Refill: 1  3. Essential hypertension Patient is encouraged to continue his current medications for hypertension and CHF.  Medication  refills provided.  Patient will return for future blood work including CMP lipid panel as well as hemoglobin A1c - sacubitril-valsartan (ENTRESTO) 24-26 MG; Take 1 tablet by mouth 2 (two) times daily.  Dispense: 60 tablet; Refill: 11 - bisoprolol (ZEBETA) 10 MG tablet; Take 1 tablet (10 mg total) by mouth daily.  Dispense: 90 tablet; Refill: 1 - Hemoglobin A1c; Future - Comprehensive metabolic panel; Future - Lipid Panel; Future  4. Mild intermittent asthma without complication Refill provided of Symbicort for continued treatment of mild intermittent asthma - budesonide-formoterol (SYMBICORT) 80-4.5 MCG/ACT inhaler; Inhale 2 puffs into the lungs 2 (two) times daily.  Dispense: 3 Inhaler; Refill: 1  5. Acute pain of left knee Prescription provided for Voltaren gel for treatment of left knee pain - diclofenac sodium (VOLTAREN) 1 % GEL; Apply 4 g topically 4 (four) times daily. As needed for knee pain  Dispense: 1 g; Refill: 4  6. Long-term use of high-risk medication Patient will return to clinic for hemoglobin A1c and complete metabolic panel in follow-up of long-term use of high-risk medications - Hemoglobin A1c; Future - Comprehensive metabolic panel; Future  7. Dyslipidemia, goal LDL below 70  Patient with history of dyslipidemia and he will return for fasting lipid panel and patient will be notified regarding the need for statin therapy based on the results - Lipid Panel; Future   Follow Up Instructions:Return in about 4 months (around 03/01/2019) for chronic issues; fasting labs next week.    I discussed the assessment and treatment plan with the patient. The patient was provided an opportunity to ask questions and all were answered. The patient agreed with the plan and demonstrated an understanding of the instructions.   The patient was advised to call back or seek an in-person evaluation if the symptoms worsen or if the condition fails to improve as anticipated.  I provided 12  minutes of non-face-to-face time during this encounter.   Antony Blackbird, MD

## 2018-11-01 ENCOUNTER — Encounter: Payer: Self-pay | Admitting: Family Medicine

## 2018-11-04 ENCOUNTER — Ambulatory Visit (HOSPITAL_COMMUNITY)
Admission: EM | Admit: 2018-11-04 | Discharge: 2018-11-04 | Disposition: A | Discharge status: Home / Self Care | Payer: Managed Care, Other (non HMO) | Attending: Family Medicine | Admitting: Family Medicine

## 2018-11-04 ENCOUNTER — Encounter (HOSPITAL_COMMUNITY): Payer: Self-pay | Admitting: Emergency Medicine

## 2018-11-04 ENCOUNTER — Ambulatory Visit (INDEPENDENT_AMBULATORY_CARE_PROVIDER_SITE_OTHER): Payer: Managed Care, Other (non HMO)

## 2018-11-04 DIAGNOSIS — M79671 Pain in right foot: Secondary | ICD-10-CM

## 2018-11-04 MED ORDER — PREDNISONE 20 MG PO TABS: 20.0000 mg | ORAL_TABLET | Freq: Two times a day (BID) | ORAL | 0 refills | Status: AC

## 2018-11-04 NOTE — Discharge Instructions (Signed)
X-ray did not show fracture or dislocation Continue conservative management of rest, ice, elevation, and gentle stretches Prednisone prescribed.  Take as directed and to completion Follow up with PCP or orthopedist if symptoms persist Return or go to the ER if you have any new or worsening symptoms (fever, chills, worsening symptoms despite medications, redness, increased swelling, calf pain/ swelling, etc...)

## 2018-11-04 NOTE — ED Provider Notes (Signed)
[place]   [id number] 11/04/18 Arrival Time: 6761  CC: RT foot pain  SUBJECTIVE: History from: patient. [name] is a 48 y.o. male hx significant for CHF, dilated cardiomyopathy, dyslipidemia, HTN, and H/O v. Fib, complains of right foot pain that began 5 days ago.  Denies a precipitating event or specific injury.  Localizes the pain to the top of foot.  Describes the pain as constant and 7/10.  Has tried OTC medications without relief.  Symptoms are made worse with weight-bearing.  Denies similar symptoms in the past.  Complains of associated swelling.  Denies fever, chills, erythema, ecchymosis, weakness, numbness and tingling.      ROS: As per HPI.  Past Medical History:  Diagnosis Date  . CHF (congestive heart failure) (HCC)    chronic  . DCM (dilated cardiomyopathy) ([place])   . Dyslipidemia   . HTN (hypertension)   . Hx of ventricular fibrillation    Hx of VFib arrest s/p AICD placement  . LV dysfunction    with an EF in 2007 at 34%   Past Surgical History:  Procedure Laterality Date  . CARDIAC DEFIBRILLATOR PLACEMENT    . repeat echo  2008    demonstrated near normalization  . TRANSTHORACIC ECHOCARDIOGRAM  11/09/2005, 01/15/2009, 02/07/2010   Allergies  Allergen Reactions  . Ace Inhibitors Cough   No current facility-administered medications on file prior to encounter.    Current Outpatient Medications on File Prior to Encounter  Medication Sig Dispense Refill  . amiodarone (PACERONE) 200 MG tablet Take 1 tablet (200 mg total) by mouth daily. 90 tablet 1  . aspirin 81 MG tablet Take 1 tablet (81 mg total) by mouth 2 (two) times daily. 30 tablet 11  . bisoprolol (ZEBETA) 10 MG tablet Take 1 tablet (10 mg total) by mouth daily. 90 tablet 1  . budesonide-formoterol (SYMBICORT) 80-4.5 MCG/ACT inhaler Inhale 2 puffs into the lungs 2 (two) times daily. 3 Inhaler 1  . diclofenac sodium (VOLTAREN) 1 % GEL Apply 4 g topically 4 (four) times daily. As needed for  knee pain 1 g 4  . sacubitril-valsartan (ENTRESTO) 24-26 MG Take 1 tablet by mouth 2 (two) times daily. 60 tablet 11   Social History   Socioeconomic History  . Marital status: Married    Spouse name: Not on file  . Number of children: Not on file  . Years of education: Not on file  . Highest education level: Not on file  Occupational History    Employer: [employer]  Social Needs  . Financial resource strain: Not on file  . Food insecurity:    Worry: Not on file    Inability: Not on file  . Transportation needs:    Medical: Not on file    Non-medical: Not on file  Tobacco Use  . Smoking status: Former Smoker    Packs/day: 0.25    Years: 2.00    Pack years: 0.50    Types: Cigarettes    Last attempt to quit: 07/12/1998    Years since quitting: 20.3  . Smokeless tobacco: Never Used  Substance and Sexual Activity  . Alcohol use: No    Comment: Denies  . Drug use: No  . Sexual activity: Not on file  Lifestyle  . Physical activity:    Days per week: Not on file    Minutes per session: Not on file  . Stress: Not on file  Relationships  . Social connections:    Talks on phone: Not  on file    Gets together: Not on file    Attends religious service: Not on file    Active member of club or organization: Not on file    Attends meetings of clubs or organizations: Not on file    Relationship status: Not on file  . Intimate partner violence:    Fear of current or ex partner: Not on file    Emotionally abused: Not on file    Physically abused: Not on file    Forced sexual activity: Not on file  Other Topics Concern  . Not on file  Social History Narrative   The patient is married.  He is in native of [place],     living in [place] for 9 years.  He has a history of tobacco use,     but quit smoking a year ago.  Denies alcohol abuse.         Family History  Problem Relation Age of Onset  . Diabetes Father   . Heart Problems Father        unsure of what kind  . Asthma  Maternal Uncle   . Diabetes Maternal Grandmother     OBJECTIVE:  Vitals:   11/04/18 1544  BP: 127/71  Pulse: (!) 50  Resp: 14  Temp: 98.9 F (37.2 C)  SpO2: 98%    General appearance: Alert; in no acute distress.  Head: NCAT Lungs: Normal respiratory effort CV: Dorsalis pedis pulse 2+; cap refill <2 seconds Musculoskeletal: Right foot Inspection: Skin warm, dry, clear and intact without obvious erythema, effusion, or ecchymosis.  Palpation: TTP over 4th MT and phalange; mildly TTP over anterolateral ankle ROM: LROM Strength: 4/5 dorsiflexion, 4/5 plantar flexion Skin: warm and dry Neurologic: Sitting in wheelchair; Sensation intact about the lower extremities Psychological: alert and cooperative; normal mood and affect  DIAGNOSTIC STUDIES:  Dg Foot Complete Right  Result Date: 11/04/2018 CLINICAL DATA:  Right foot pain and swelling. Pain fourth and fifth metatarsal region EXAM: RIGHT FOOT COMPLETE - 3+ VIEW COMPARISON:  None. FINDINGS: Negative for fracture.  No significant arthropathy. Small osteoma of the medial aspect of the distal first phalanx. Calcaneal spurring. IMPRESSION: No acute abnormality. Electronically Signed   By: Franchot Gallo M.D.   On: 11/04/2018 16:35     ASSESSMENT & PLAN:  1. Right foot pain     Meds ordered this encounter  Medications  . predniSONE (DELTASONE) 20 MG tablet    Sig: Take 1 tablet (20 mg total) by mouth 2 (two) times daily with a meal for 5 days.    Dispense:  10 tablet    Refill:  0    Order Specific Question:   Supervising Provider    Answer:   Raylene Everts [[id number]]   X-ray did not show fracture or dislocation Continue conservative management of rest, ice, elevation, and gentle stretches Prednisone prescribed.  Take as directed and to completion Follow up with PCP or orthopedist if symptoms persist Return or go to the ER if you have any new or worsening symptoms (fever, chills, worsening symptoms despite medications,  redness, increased swelling, calf pain/ swelling, etc...)   Reviewed expectations re: course of current medical issues. Questions answered. Outlined signs and symptoms indicating need for more acute intervention. Patient verbalized understanding. After Visit Summary given.    Lestine Box, PA-C 11/04/18 1651

## 2018-11-04 NOTE — ED Triage Notes (Signed)
Pt c/o R foot pain and swelling x3 days, denies injury, denies hx of gout.

## 2018-11-15 ENCOUNTER — Other Ambulatory Visit: Payer: Managed Care, Other (non HMO)

## 2018-11-16 ENCOUNTER — Ambulatory Visit: Payer: Managed Care, Other (non HMO) | Attending: Family Medicine

## 2018-11-16 ENCOUNTER — Other Ambulatory Visit: Payer: Self-pay

## 2018-11-16 DIAGNOSIS — I5022 Chronic systolic (congestive) heart failure: Secondary | ICD-10-CM

## 2018-11-16 DIAGNOSIS — Z79899 Other long term (current) drug therapy: Secondary | ICD-10-CM

## 2018-11-16 DIAGNOSIS — I10 Essential (primary) hypertension: Secondary | ICD-10-CM

## 2018-11-16 DIAGNOSIS — E785 Hyperlipidemia, unspecified: Secondary | ICD-10-CM

## 2018-11-17 ENCOUNTER — Other Ambulatory Visit: Payer: Self-pay | Admitting: Family Medicine

## 2018-11-17 DIAGNOSIS — Z79899 Other long term (current) drug therapy: Secondary | ICD-10-CM

## 2018-11-17 DIAGNOSIS — E785 Hyperlipidemia, unspecified: Secondary | ICD-10-CM

## 2018-11-17 LAB — COMPREHENSIVE METABOLIC PANEL WITH GFR
ALT: 30 IU/L (ref 0–44)
AST: 22 IU/L (ref 0–40)
Albumin/Globulin Ratio: 1.7 (ref 1.2–2.2)
Albumin: 3.9 g/dL — ABNORMAL LOW (ref 4.0–5.0)
Alkaline Phosphatase: 52 IU/L (ref 39–117)
BUN/Creatinine Ratio: 10 (ref 9–20)
BUN: 13 mg/dL (ref 6–24)
Bilirubin Total: 0.6 mg/dL (ref 0.0–1.2)
CO2: 25 mmol/L (ref 20–29)
Calcium: 9.1 mg/dL (ref 8.7–10.2)
Chloride: 105 mmol/L (ref 96–106)
Creatinine, Ser: 1.27 mg/dL (ref 0.76–1.27)
GFR calc Af Amer: 77 mL/min/1.73
GFR calc non Af Amer: 67 mL/min/1.73
Globulin, Total: 2.3 g/dL (ref 1.5–4.5)
Glucose: 78 mg/dL (ref 65–99)
Potassium: 4.6 mmol/L (ref 3.5–5.2)
Sodium: 141 mmol/L (ref 134–144)
Total Protein: 6.2 g/dL (ref 6.0–8.5)

## 2018-11-17 LAB — LIPID PANEL
Chol/HDL Ratio: 7.5 ratio — ABNORMAL HIGH (ref 0.0–5.0)
Cholesterol, Total: 202 mg/dL — ABNORMAL HIGH (ref 100–199)
HDL: 27 mg/dL — ABNORMAL LOW
LDL Calculated: 137 mg/dL — ABNORMAL HIGH (ref 0–99)
Triglycerides: 190 mg/dL — ABNORMAL HIGH (ref 0–149)
VLDL Cholesterol Cal: 38 mg/dL (ref 5–40)

## 2018-11-17 LAB — HEMOGLOBIN A1C
Est. average glucose Bld gHb Est-mCnc: 114 mg/dL
Hgb A1c MFr Bld: 5.6 % (ref 4.8–5.6)

## 2018-11-17 MED ORDER — ROSUVASTATIN CALCIUM 10 MG PO TABS: 10.0000 mg | ORAL_TABLET | Freq: Every day | ORAL | 3 refills | Status: DC

## 2018-11-17 NOTE — Progress Notes (Signed)
Patient ID: [name], male   DOB: 1971-06-30, 48 y.o.   MRN: [id number]   Patient with recent lipid panel with elevated LDL and TG as well as low HDL. Patient will be notified of results and that medication has been sent in if he chooses to take the medication. Order placed for LFT's for 6 weeks after patient starts medication

## 2018-11-18 MED FILL — ROSUVASTATIN CALCIUM 10 MG: 10 | 30 days supply | Qty: 30 | Fill #0

## 2018-12-06 ENCOUNTER — Telehealth: Payer: Self-pay | Admitting: *Deleted

## 2018-12-06 MED FILL — ENTRESTO 24 MG-26 MG TABLET: 24-26 | 30 days supply | Qty: 60 | Fill #4

## 2018-12-06 MED FILL — AMIODARONE HCL 200 MG TAB: 200 | 30 days supply | Qty: 30 | Fill #1

## 2018-12-06 MED FILL — SYMBICORT 80-4.5 MCG INH: 80-4.5 | 30 days supply | Qty: 10 | Fill #3

## 2018-12-06 MED FILL — BISOPROLOL FUMARATE 10 MG T: 10 | 30 days supply | Qty: 30 | Fill #1

## 2018-12-06 NOTE — Telephone Encounter (Signed)
Pt calling requesting a coupon $10 copay card for entresto. I informed pt that I will leave a copay card downstairs. I advised pt that if he has any other problems, questions or concerns to call the office. Pt verbalized understanding.

## 2019-01-04 MED FILL — ENTRESTO 24 MG-26 MG TABLET: 24-26 | 30 days supply | Qty: 60 | Fill #5

## 2019-01-04 MED FILL — BISOPROLOL FUMARATE 10 MG T: 10 | 30 days supply | Qty: 30 | Fill #2

## 2019-01-04 MED FILL — AMIODARONE HCL 200 MG TAB: 200 | 30 days supply | Qty: 30 | Fill #2

## 2019-01-05 MED FILL — SYMBICORT 80-4.5 MCG INH: 80-4.5 | 30 days supply | Qty: 10 | Fill #4

## 2019-01-16 ENCOUNTER — Encounter: Payer: Self-pay | Admitting: *Deleted

## 2019-01-17 ENCOUNTER — Telehealth: Payer: Self-pay

## 2019-01-17 NOTE — Telephone Encounter (Signed)
Left message for patient to remind of missed remote transmission.  

## 2019-01-27 ENCOUNTER — Encounter: Payer: Self-pay | Admitting: Cardiology

## 2019-01-31 MED FILL — AMIODARONE HCL 200 MG TAB: 200 | 30 days supply | Qty: 30 | Fill #3

## 2019-01-31 MED FILL — BISOPROLOL FUMARATE 10 MG T: 10 | 30 days supply | Qty: 30 | Fill #3

## 2019-01-31 MED FILL — ENTRESTO 24 MG-26 MG TABLET: 24-26 | 30 days supply | Qty: 60 | Fill #6

## 2019-02-02 MED FILL — SYMBICORT 80-4.5 MCG INH: 80-4.5 | 30 days supply | Qty: 10 | Fill #5

## 2019-02-08 ENCOUNTER — Ambulatory Visit (INDEPENDENT_AMBULATORY_CARE_PROVIDER_SITE_OTHER): Payer: Managed Care, Other (non HMO) | Admitting: *Deleted

## 2019-02-08 DIAGNOSIS — I4901 Ventricular fibrillation: Secondary | ICD-10-CM | POA: Diagnosis not present

## 2019-02-09 LAB — CUP PACEART REMOTE DEVICE CHECK
Battery Remaining Longevity: 29 mo
Battery Remaining Percentage: 24 %
Battery Voltage: 2.8 V
Brady Statistic RV Percent Paced: 3.8 %
Date Time Interrogation Session: 20200730143146
HighPow Impedance: 45 Ohm
Implantable Lead Implant Date: 20110805
Implantable Lead Location: 753860
Implantable Lead Model: 185
Implantable Lead Serial Number: 39993
Implantable Pulse Generator Implant Date: 20110805
Lead Channel Impedance Value: 380 Ohm
Lead Channel Pacing Threshold Amplitude: 0.75 V
Lead Channel Pacing Threshold Pulse Width: 0.4 ms
Lead Channel Sensing Intrinsic Amplitude: 11.7 mV
Lead Channel Setting Pacing Amplitude: 2.5 V
Lead Channel Setting Pacing Pulse Width: 0.4 ms
Lead Channel Setting Sensing Sensitivity: 0.5 mV
Pulse Gen Serial Number: 728671

## 2019-02-09 MED FILL — ENTRESTO 24 MG-26 MG TABLET: 24-26 | 30 days supply | Qty: 60 | Fill #6

## 2019-02-09 MED FILL — BISOPROLOL FUMARATE 10 MG T: 10 | 30 days supply | Qty: 30 | Fill #3

## 2019-02-09 MED FILL — AMIODARONE HCL 200 MG TAB: 200 | 30 days supply | Qty: 30 | Fill #3

## 2019-02-16 ENCOUNTER — Telehealth: Payer: Managed Care, Other (non HMO) | Admitting: Family Medicine

## 2019-02-16 ENCOUNTER — Encounter: Payer: Self-pay | Admitting: Cardiology

## 2019-02-16 NOTE — Progress Notes (Signed)
Remote ICD transmission.   

## 2019-03-08 MED FILL — ENTRESTO 24 MG-26 MG TABLET: 24-26 | 30 days supply | Qty: 60 | Fill #7

## 2019-03-08 MED FILL — AMIODARONE HCL 200 MG TAB: 200 | 30 days supply | Qty: 30 | Fill #4

## 2019-03-08 MED FILL — BISOPROLOL FUMARATE 10 MG T: 10 | 30 days supply | Qty: 30 | Fill #1

## 2019-03-14 ENCOUNTER — Ambulatory Visit: Payer: Managed Care, Other (non HMO) | Admitting: Family Medicine

## 2019-03-14 ENCOUNTER — Telehealth: Payer: Self-pay | Admitting: *Deleted

## 2019-03-14 NOTE — Telephone Encounter (Signed)
Patient was scheduled for visit with provider 03-15-19. Tele visit was offered to patient but he did not want to have tele visit. Per pt due to why he was suppose to come in can not be done vis telephone. Per pt he is also needing labs as well(kidney function test and liver function test).

## 2019-03-15 ENCOUNTER — Ambulatory Visit: Payer: Managed Care, Other (non HMO) | Admitting: Family Medicine

## 2019-03-15 NOTE — Telephone Encounter (Signed)
Please see if there is another provider who may have an opening to see patient in person per his request

## 2019-03-15 NOTE — Telephone Encounter (Signed)
Called pt and LMOM to call office back. Appt for patient was made for [facility] location and patient needs to confirm appt when he calls back.

## 2019-03-16 ENCOUNTER — Ambulatory Visit: Payer: Managed Care, Other (non HMO) | Admitting: Family Medicine

## 2019-03-16 ENCOUNTER — Telehealth: Payer: Self-pay

## 2019-03-16 NOTE — Telephone Encounter (Signed)
Called patient to do their pre-visit COVID screening.  Voicemail full. Unable to do prescreening.

## 2019-03-16 NOTE — Telephone Encounter (Signed)
UTR, per message patient voicemail box is full. Per previous message, provider wanted pt to be seen sooner due to his concerns. [facility] staff tried to call patient to confirm his appt and was not able to reach him as well.

## 2019-03-16 NOTE — Telephone Encounter (Signed)
Error

## 2019-03-20 ENCOUNTER — Ambulatory Visit: Payer: Managed Care, Other (non HMO)

## 2019-04-11 ENCOUNTER — Ambulatory Visit: Payer: Managed Care, Other (non HMO) | Admitting: Family Medicine

## 2019-04-12 MED FILL — SYMBICORT 80-4.5 MCG INH: 80-4.5 | 30 days supply | Qty: 10 | Fill #6

## 2019-05-10 ENCOUNTER — Other Ambulatory Visit: Payer: Self-pay | Admitting: Internal Medicine

## 2019-05-10 ENCOUNTER — Other Ambulatory Visit: Payer: Self-pay | Admitting: Family Medicine

## 2019-05-10 ENCOUNTER — Encounter: Payer: Managed Care, Other (non HMO) | Admitting: *Deleted

## 2019-05-10 DIAGNOSIS — I4901 Ventricular fibrillation: Secondary | ICD-10-CM

## 2019-05-10 DIAGNOSIS — I5022 Chronic systolic (congestive) heart failure: Secondary | ICD-10-CM

## 2019-05-10 MED ORDER — AMIODARONE HCL 200 MG PO TABS: 200.0000 mg | ORAL_TABLET | Freq: Every day | ORAL | 0 refills | Status: DC

## 2019-05-10 MED FILL — AMIODARONE HCL 200 MG TAB: 200 | 30 days supply | Qty: 30 | Fill #0

## 2019-05-10 MED FILL — BISOPROLOL FUMARATE 10 MG T: 10 | 30 days supply | Qty: 30 | Fill #3

## 2019-05-10 MED FILL — ENTRESTO 24 MG-26 MG TABLET: 24-26 | 30 days supply | Qty: 60 | Fill #9

## 2019-05-10 NOTE — Telephone Encounter (Signed)
New Message     *STAT* If patient is at the pharmacy, call can be transferred to refill team.   1. Which medications need to be refilled? (please list name of each medication and dose if known) amiodarone 200 mg   2. Which pharmacy/location (including street and city if local pharmacy) [facility]   3. Do they need a 30 day or 90 day supply? 90 days

## 2019-05-10 NOTE — Telephone Encounter (Signed)
Pt's medication was sent to pt's pharmacy as requested. Confirmation received.  °

## 2019-05-17 NOTE — Progress Notes (Signed)
Cardiology Office Note Date:  05/18/2019  Patient ID:  [name], [name] [birth year], MRN [id number] PCP:  Antony Blackbird, MD  Electrophysiologist:  Dr. Lovena Le    Chief Complaint: annual devic visit  History of Present Illness: [name] is a 48 y.o. male with history of DCM, chronic CHF (systolic), VT/VF, ICD, HTN, HLD.  She comes in today to be seen for Dr. Lovena Le, last seen by him in Dec 2019.  At that time, stable class II CHF, no shocks, Rx sildenifil for ED, otherwise no changes.  He is doing well.  He has what looks like longstanding bradycardia, though no symptoms. No dizzy spells, near syncope or syncope. He does not exercise, admits that he is on again off again with diet and exercise.  He denies any difficulties or intolerances to his ADLs, keeps up with the kids, stuff around the house. Occassionally he is aware of his heart beat, maybe an extra beat hear.there, or a strong beat,  Nothing fast, persistent, or symptomatic.  He has noticed that some days at the end of the day he has some swelling in his legs.  Never noted in the AM's, no symptoms of PND or orthopnea.  He asks how long it has been since we have check on his heart, would like to have things updated.  Device information SJM single chamber ICD, implanted 02/13/10   Past Medical History:  Diagnosis Date  . CHF (congestive heart failure) (HCC)    chronic  . DCM (dilated cardiomyopathy) ([place])   . Dyslipidemia   . HTN (hypertension)   . Hx of ventricular fibrillation    Hx of VFib arrest s/p AICD placement  . LV dysfunction    with an EF in 2007 at 34%    Past Surgical History:  Procedure Laterality Date  . CARDIAC DEFIBRILLATOR PLACEMENT    . repeat echo  2008    demonstrated near normalization  . TRANSTHORACIC ECHOCARDIOGRAM  11/09/2005, 01/15/2009, 02/07/2010    Current Outpatient Medications  Medication Sig Dispense Refill  . amiodarone (PACERONE) 200 MG tablet Take 1 tablet (200 mg total) by  mouth daily. Please keep upcoming appt in November for future refills. Thank you 90 tablet 0  . aspirin 81 MG tablet Take 1 tablet (81 mg total) by mouth 2 (two) times daily. 30 tablet 11  . bisoprolol (ZEBETA) 10 MG tablet Take 1 tablet (10 mg total) by mouth daily. 90 tablet 1  . budesonide-formoterol (SYMBICORT) 80-4.5 MCG/ACT inhaler Inhale 2 puffs into the lungs 2 (two) times daily. 3 Inhaler 1  . diclofenac sodium (VOLTAREN) 1 % GEL Apply 4 g topically 4 (four) times daily. As needed for knee pain 1 g 4  . rosuvastatin (CRESTOR) 10 MG tablet Take 1 tablet (10 mg total) by mouth daily. To lower cholesterol 90 tablet 3  . sacubitril-valsartan (ENTRESTO) 24-26 MG Take 1 tablet by mouth 2 (two) times daily. 60 tablet 11   No current facility-administered medications for this visit.     Allergies:   Ace inhibitors   Social History:  The patient  reports that he quit smoking about 20 years ago. His smoking use included cigarettes. He has a 0.50 pack-year smoking history. He has never used smokeless tobacco. He reports that he does not drink alcohol or use drugs.   Family History:  The patient's family history includes Asthma in his maternal uncle; Diabetes in his father and maternal grandmother; Heart Problems in his father.  ROS:  Please see  the history of present illness.    All other systems are reviewed and otherwise negative.   PHYSICAL EXAM:  VS:  BP 110/76   Pulse (!) 45   Ht 6\' 2"  (1.88 m)   Wt 241 lb (109.3 kg)   BMI 30.94 kg/m  BMI: Body mass index is 30.94 kg/m. Well nourished, well developed, in no acute distress  HEENT: normocephalic, atraumatic  Neck: no JVD, carotid bruits or masses Cardiac:  RRR; no significant murmurs, no rubs, or gallops Lungs:  CTA b/l, no wheezing, rhonchi or rales  Abd: soft, nontender, obese MS: no deformity or atrophy Ext: trace edema b/l Skin: warm and dry, no rash Neuro:  No gross deficits appreciated Psych: euthymic mood, full affect   ICD site is stable, no tethering or discomfort   EKG:  Done today and reviewed by myself shows SB, 45bpm, no acute changes ICD interrogation done today and reviewed by myself:  Battery and lead measurements are OK No arrhythmias or device observations No therapies  02/07/10: TTE Study Conclusions  Left ventricle: The cavity size was normal. Systolic function was  moderately reduced. The estimated ejection fraction was in the range  of 35% to 40%. Diffuse hypokinesis.     Recent Labs: 11/16/2018: ALT 30; BUN 13; Creatinine, Ser 1.27; Potassium 4.6; Sodium 141  11/16/2018: Chol/HDL Ratio 7.5; Cholesterol, Total 202; HDL 27; LDL Calculated 137; Triglycerides 190   CrCl cannot be calculated (Patient's most recent lab result is older than the maximum 21 days allowed.).   Wt Readings from Last 3 Encounters:  05/18/19 241 lb (109.3 kg)  06/14/18 234 lb (106.1 kg)  06/13/18 235 lb (106.6 kg)     Other studies reviewed: Additional studies/records reviewed today include: summarized above  ASSESSMENT AND PLAN:  1. ICD     H/o VT/VF     On amiodarone, no arrhythmias     Intact function, no programming changes made  2. DCM 3. Chronic CHF systolic)     He has trace edema, this sound of dependent edema      corVue wobbles around threshold.  It at threshold though trending down     He admits to being [place]; with sodium and counseled     On BB/Entresto     Will update his echo  4. HTN     Looks OK  5. Asymptomatic bradycardia     Given no symptoms, not new, no changes to his BB with h/o VT/VF historically  6. Hyperlipidemia     His PMD checked his lipids and Rx Crestor, though he wanted to try exercise and diet changes first     He will follow with his primary on this    Disposition: F/u with BMET, TSH, LFTs today, update his echo, Q3 month remotes, he askes to be seen bi-anually, will see him back in 6 mo, sooner if needed  Current medicines are reviewed at length with  the patient today.  The patient did not have any concerns regarding medicines.  Venetia Night, PA-C 05/18/2019 4:00 PM     [address] [place] [address] [phone] (office)  [phone] (fax)

## 2019-05-18 ENCOUNTER — Ambulatory Visit (INDEPENDENT_AMBULATORY_CARE_PROVIDER_SITE_OTHER): Payer: Managed Care, Other (non HMO) | Admitting: Physician Assistant

## 2019-05-18 ENCOUNTER — Other Ambulatory Visit: Payer: Self-pay

## 2019-05-18 VITALS — BP 110/76 | HR 45 | Ht 74.0 in | Wt 241.0 lb

## 2019-05-18 DIAGNOSIS — I5022 Chronic systolic (congestive) heart failure: Secondary | ICD-10-CM

## 2019-05-18 DIAGNOSIS — I472 Ventricular tachycardia, unspecified: Secondary | ICD-10-CM

## 2019-05-18 DIAGNOSIS — R001 Bradycardia, unspecified: Secondary | ICD-10-CM

## 2019-05-18 DIAGNOSIS — Z95810 Presence of automatic (implantable) cardiac defibrillator: Secondary | ICD-10-CM

## 2019-05-18 DIAGNOSIS — I428 Other cardiomyopathies: Secondary | ICD-10-CM | POA: Diagnosis not present

## 2019-05-18 DIAGNOSIS — I10 Essential (primary) hypertension: Secondary | ICD-10-CM

## 2019-05-18 DIAGNOSIS — Z79899 Other long term (current) drug therapy: Secondary | ICD-10-CM

## 2019-05-18 NOTE — Patient Instructions (Signed)
.  Medication Instructions:   Your physician recommends that you continue on your current medications as directed. Please refer to the Current Medication list given to you today.   *If you need a refill on your cardiac medications before your next appointment, please call your pharmacy*  Lab Work: BMET  TSH AND LFT   If you have labs (blood work) drawn today and your tests are completely normal, you will receive your results only by: [facility] Kitchen MyChart Message (if you have MyChart) OR . A paper copy in the mail If you have any lab test that is abnormal or we need to change your treatment, we will call you to review the results.  Testing/Procedures: Your physician has requested that you have an echocardiogram. Echocardiography is a painless test that uses sound waves to create images of your heart. It provides your doctor with information about the size and shape of your heart and how well your heart's chambers and valves are working. This procedure takes approximately one hour. There are no restrictions for this procedure.   Follow-Up: At [facility], you and your health needs are our priority.  As part of our continuing mission to provide you with exceptional heart care, we have created designated Provider Care Teams.  These Care Teams include your primary Cardiologist (physician) and Advanced Practice Providers (APPs -  Physician Assistants and Nurse Practitioners) who all work together to provide you with the care you need, when you need it.  Your next appointment:   6 months  The format for your next appointment:   In Person  Provider:   You may see  Dr.Taylor  or one of the following Advanced Practice Providers on your designated Care Team:    Chanetta Marshall, NP  Tommye Standard, PA-C  Legrand Como "Oda Kilts, [place]   Other Instructions

## 2019-05-19 LAB — BASIC METABOLIC PANEL
BUN/Creatinine Ratio: 12 (ref 9–20)
BUN: 17 mg/dL (ref 6–24)
CO2: 22 mmol/L (ref 20–29)
Calcium: 9.2 mg/dL (ref 8.7–10.2)
Chloride: 107 mmol/L — ABNORMAL HIGH (ref 96–106)
Creatinine, Ser: 1.4 mg/dL — ABNORMAL HIGH (ref 0.76–1.27)
GFR calc Af Amer: 69 mL/min/{1.73_m2} (ref >59)
GFR calc non Af Amer: 59 mL/min/{1.73_m2} — ABNORMAL LOW (ref >59)
Glucose: 112 mg/dL — ABNORMAL HIGH (ref 65–99)
Potassium: 4.4 mmol/L (ref 3.5–5.2)
Sodium: 144 mmol/L (ref 134–144)

## 2019-05-19 LAB — HEPATIC FUNCTION PANEL
ALT: 31 IU/L (ref 0–44)
AST: 29 IU/L (ref 0–40)
Albumin: 4.2 g/dL (ref 4.0–5.0)
Alkaline Phosphatase: 68 IU/L (ref 39–117)
Bilirubin Total: 0.5 mg/dL (ref 0.0–1.2)
Bilirubin, Direct: 0.15 mg/dL (ref 0.00–0.40)
Total Protein: 6.6 g/dL (ref 6.0–8.5)

## 2019-05-19 LAB — TSH: TSH: 2.26 u[IU]/mL (ref 0.450–4.500)

## 2019-05-21 ENCOUNTER — Other Ambulatory Visit: Payer: Self-pay | Admitting: *Deleted

## 2019-05-21 DIAGNOSIS — Z79899 Other long term (current) drug therapy: Secondary | ICD-10-CM

## 2019-05-21 DIAGNOSIS — I5022 Chronic systolic (congestive) heart failure: Secondary | ICD-10-CM

## 2019-06-01 ENCOUNTER — Other Ambulatory Visit (HOSPITAL_COMMUNITY): Payer: Managed Care, Other (non HMO)

## 2019-06-01 ENCOUNTER — Ambulatory Visit: Payer: Managed Care, Other (non HMO) | Admitting: Family Medicine

## 2019-06-05 ENCOUNTER — Other Ambulatory Visit: Payer: Self-pay

## 2019-06-05 DIAGNOSIS — I5022 Chronic systolic (congestive) heart failure: Secondary | ICD-10-CM

## 2019-06-05 DIAGNOSIS — I4901 Ventricular fibrillation: Secondary | ICD-10-CM

## 2019-06-06 MED ORDER — AMIODARONE HCL 200 MG PO TABS: 200.0000 mg | ORAL_TABLET | Freq: Every day | ORAL | 3 refills | Status: DC

## 2019-06-06 MED FILL — AMIODARONE HCL 200 MG TAB: 200 | 30 days supply | Qty: 30 | Fill #1

## 2019-06-06 MED FILL — BISOPROLOL FUMARATE 10 MG T: 10 | 30 days supply | Qty: 30 | Fill #4

## 2019-06-06 MED FILL — ENTRESTO 24 MG-26 MG TABLET: 24-26 | 30 days supply | Qty: 60 | Fill #10

## 2019-06-12 ENCOUNTER — Other Ambulatory Visit: Payer: Self-pay

## 2019-06-12 ENCOUNTER — Ambulatory Visit (HOSPITAL_COMMUNITY): Payer: Managed Care, Other (non HMO) | Attending: Cardiovascular Disease

## 2019-06-12 ENCOUNTER — Other Ambulatory Visit: Payer: Managed Care, Other (non HMO)

## 2019-06-12 DIAGNOSIS — Z79899 Other long term (current) drug therapy: Secondary | ICD-10-CM | POA: Diagnosis present

## 2019-06-12 DIAGNOSIS — I5022 Chronic systolic (congestive) heart failure: Secondary | ICD-10-CM

## 2019-06-12 DIAGNOSIS — I429 Cardiomyopathy, unspecified: Secondary | ICD-10-CM

## 2019-06-12 LAB — BASIC METABOLIC PANEL
BUN/Creatinine Ratio: 12 (ref 9–20)
BUN: 16 mg/dL (ref 6–24)
CO2: 25 mmol/L (ref 20–29)
Calcium: 9.4 mg/dL (ref 8.7–10.2)
Chloride: 102 mmol/L (ref 96–106)
Creatinine, Ser: 1.34 mg/dL — ABNORMAL HIGH (ref 0.76–1.27)
GFR calc Af Amer: 72 mL/min/{1.73_m2} (ref >59)
GFR calc non Af Amer: 63 mL/min/{1.73_m2} (ref >59)
Glucose: 110 mg/dL — ABNORMAL HIGH (ref 65–99)
Potassium: 4.4 mmol/L (ref 3.5–5.2)
Sodium: 139 mmol/L (ref 134–144)

## 2019-06-27 MED FILL — BISOPROLOL FUMARATE 10 MG T: 10 | 30 days supply | Qty: 30 | Fill #5

## 2019-07-03 MED FILL — ENTRESTO 24 MG-26 MG TABLET: 24-26 | 30 days supply | Qty: 60 | Fill #1

## 2019-07-03 MED FILL — AMIODARONE HCL 200 MG TAB: 200 | 30 days supply | Qty: 30 | Fill #2

## 2019-08-08 LAB — CUP PACEART REMOTE DEVICE CHECK
Battery Remaining Longevity: 22 mo
Battery Remaining Percentage: 18 %
Battery Voltage: 2.75 V
Brady Statistic RV Percent Paced: 3 %
Date Time Interrogation Session: 20210124043508
HighPow Impedance: 50 Ohm
Implantable Lead Implant Date: 20110805
Implantable Lead Location: 753860
Implantable Lead Model: 185
Implantable Lead Serial Number: 39993
Implantable Pulse Generator Implant Date: 20110805
Lead Channel Impedance Value: 400 Ohm
Lead Channel Pacing Threshold Amplitude: 0.75 V
Lead Channel Pacing Threshold Pulse Width: 0.4 ms
Lead Channel Sensing Intrinsic Amplitude: 11.7 mV
Lead Channel Setting Pacing Amplitude: 2.5 V
Lead Channel Setting Pacing Pulse Width: 0.4 ms
Lead Channel Setting Sensing Sensitivity: 0.5 mV
Pulse Gen Serial Number: 728671

## 2019-08-09 ENCOUNTER — Ambulatory Visit (INDEPENDENT_AMBULATORY_CARE_PROVIDER_SITE_OTHER): Payer: Managed Care, Other (non HMO) | Admitting: *Deleted

## 2019-08-09 DIAGNOSIS — I4901 Ventricular fibrillation: Secondary | ICD-10-CM

## 2019-08-09 MED FILL — BISOPROLOL FUMARATE 10 MG T: 10 | 30 days supply | Qty: 30 | Fill #6

## 2019-08-09 MED FILL — ENTRESTO 24 MG-26 MG TABLET: 24-26 | 30 days supply | Qty: 60 | Fill #2

## 2019-08-09 MED FILL — AMIODARONE HCL 200 MG TAB: 200 | 30 days supply | Qty: 30 | Fill #0

## 2019-09-10 MED FILL — AMIODARONE HCL 200 MG TAB: 200 | 30 days supply | Qty: 30 | Fill #1

## 2019-09-10 MED FILL — BISOPROLOL FUMARATE 10 MG T: 10 | 30 days supply | Qty: 30 | Fill #7

## 2019-09-10 MED FILL — ENTRESTO 24 MG-26 MG TABLET: 24-26 | 30 days supply | Qty: 60 | Fill #3

## 2019-10-05 MED FILL — ENTRESTO 24 MG-26 MG TABLET: 24-26 | 30 days supply | Qty: 60 | Fill #4

## 2019-10-05 MED FILL — BISOPROLOL FUMARATE 10 MG T: 10 | 30 days supply | Qty: 30 | Fill #4

## 2019-10-05 MED FILL — AMIODARONE HCL 200 MG TAB: 200 | 30 days supply | Qty: 30 | Fill #2

## 2019-10-24 ENCOUNTER — Other Ambulatory Visit: Payer: Self-pay

## 2019-10-24 ENCOUNTER — Encounter (HOSPITAL_COMMUNITY): Payer: Self-pay

## 2019-10-24 ENCOUNTER — Ambulatory Visit (HOSPITAL_COMMUNITY)
Admission: EM | Admit: 2019-10-24 | Discharge: 2019-10-24 | Disposition: A | Discharge status: Home / Self Care | Payer: Managed Care, Other (non HMO) | Attending: Urgent Care | Admitting: Urgent Care

## 2019-10-24 DIAGNOSIS — M7989 Other specified soft tissue disorders: Secondary | ICD-10-CM

## 2019-10-24 DIAGNOSIS — M79671 Pain in right foot: Secondary | ICD-10-CM

## 2019-10-24 DIAGNOSIS — R7989 Other specified abnormal findings of blood chemistry: Secondary | ICD-10-CM

## 2019-10-24 DIAGNOSIS — I5022 Chronic systolic (congestive) heart failure: Secondary | ICD-10-CM

## 2019-10-24 DIAGNOSIS — Z8679 Personal history of other diseases of the circulatory system: Secondary | ICD-10-CM

## 2019-10-24 DIAGNOSIS — Z95810 Presence of automatic (implantable) cardiac defibrillator: Secondary | ICD-10-CM

## 2019-10-24 MED ORDER — PREDNISONE 20 MG PO TABS: ORAL_TABLET | ORAL | 0 refills | Status: DC

## 2019-10-24 MED FILL — predniSONE 20 MG TABS: 20 | 5 days supply | Qty: 10 | Fill #0

## 2019-10-24 NOTE — Discharge Instructions (Signed)
I will manage your foot pain and swelling for gout. Please make sure you are avoiding foods high in purines as listed. Make sure you follow up with your PCP if your symptoms persist.

## 2019-10-24 NOTE — ED Triage Notes (Signed)
Pt c/o right foot pain/swelling gradual onset three days ago. Denies injury/trauma to area.  Medial aspect of foot/ankle edema/warmth noted. Pt stated similar symptom occurred last year as well. Denies fever, chills.

## 2019-10-24 NOTE — ED Provider Notes (Signed)
[place]   MRN: [id number] DOB: May 30, 1971  Subjective:   [name] is a 49 y.o. male presenting for 3-day history of acute onset recurrent severe right foot pain and swelling.  Denies trauma, falls.  Denies history of gout.  However, patient has had previous episodes over the same area of acute onset of pain.  Has been told before as seen in chart review that he has an elevated uric acid level.  On further questioning, patient admits that he eats fish regularly, states that it was recommended to them because it was healthy.  He does have a history of chronic systolic heart failure and has an implanted cardio defibrillator.  Has a history of hypertension.  No current facility-administered medications for this encounter.  Current Outpatient Medications:  .  amiodarone (PACERONE) 200 MG tablet, Take 1 tablet (200 mg total) by mouth daily., Disp: 90 tablet, Rfl: 3 .  aspirin 81 MG tablet, Take 1 tablet (81 mg total) by mouth 2 (two) times daily., Disp: 30 tablet, Rfl: 11 .  bisoprolol (ZEBETA) 10 MG tablet, Take 1 tablet (10 mg total) by mouth daily., Disp: 90 tablet, Rfl: 1 .  sacubitril-valsartan (ENTRESTO) 24-26 MG, Take 1 tablet by mouth 2 (two) times daily., Disp: 60 tablet, Rfl: 11 .  budesonide-formoterol (SYMBICORT) 80-4.5 MCG/ACT inhaler, Inhale 2 puffs into the lungs 2 (two) times daily., Disp: 3 Inhaler, Rfl: 1 .  diclofenac sodium (VOLTAREN) 1 % GEL, Apply 4 g topically 4 (four) times daily. As needed for knee pain, Disp: 1 g, Rfl: 4 .  rosuvastatin (CRESTOR) 10 MG tablet, Take 1 tablet (10 mg total) by mouth daily. To lower cholesterol, Disp: 90 tablet, Rfl: 3   Allergies  Allergen Reactions  . Ace Inhibitors Cough    Past Medical History:  Diagnosis Date  . CHF (congestive heart failure) (HCC)    chronic  . DCM (dilated cardiomyopathy) ([place])   . Dyslipidemia   . HTN (hypertension)   . Hx of ventricular fibrillation    Hx of VFib arrest s/p AICD placement    . LV dysfunction    with an EF in 2007 at 34%     Past Surgical History:  Procedure Laterality Date  . CARDIAC DEFIBRILLATOR PLACEMENT    . PACEMAKER IMPLANT    . repeat echo  2008    demonstrated near normalization  . TRANSTHORACIC ECHOCARDIOGRAM  11/09/2005, 01/15/2009, 02/07/2010    Family History  Problem Relation Age of Onset  . Diabetes Father   . Heart Problems Father        unsure of what kind  . Asthma Maternal Uncle   . Diabetes Maternal Grandmother     Social History   Tobacco Use  . Smoking status: Former Smoker    Packs/day: 0.25    Years: 2.00    Pack years: 0.50    Types: Cigarettes    Quit date: 07/12/1998    Years since quitting: 21.2  . Smokeless tobacco: Never Used  Substance Use Topics  . Alcohol use: No    Comment: Denies  . Drug use: No    ROS   Objective:   Vitals: BP 115/71 (BP Location: Left Arm)   Pulse 91   Temp 98.2 F (36.8 C) (Oral)   Resp 18   SpO2 98%   Physical Exam Constitutional:      General: He is not in acute distress.    Appearance: Normal appearance. He is well-developed. He is obese. He is  not ill-appearing, toxic-appearing or diaphoretic.  HENT:     Head: Normocephalic and atraumatic.     Right Ear: External ear normal.     Left Ear: External ear normal.     Nose: Nose normal.     Mouth/Throat:     Pharynx: Oropharynx is clear.  Eyes:     General: No scleral icterus.       Right eye: No discharge.        Left eye: No discharge.     Extraocular Movements: Extraocular movements intact.     Pupils: Pupils are equal, round, and reactive to light.  Cardiovascular:     Rate and Rhythm: Normal rate.  Pulmonary:     Effort: Pulmonary effort is normal.  Musculoskeletal:     Cervical back: Normal range of motion.       Feet:     Comments: DP, PT pulses were 2+ for right foot.   Neurological:     Mental Status: He is alert and oriented to person, place, and time.  Psychiatric:        Mood and Affect: Mood  normal.        Behavior: Behavior normal.        Thought Content: Thought content normal.        Judgment: Judgment normal.      Assessment and Plan :   1. Foot pain, right   2. Foot swelling   3. Elevated serum creatinine   4. Chronic systolic heart failure ([place])   5. History of ventricular fibrillation   6. History of placement of internal cardiac defibrillator     Suspect gout attack. Recommended prednisone course given his heart hx. Counseled on low purine diet. Follow up with PCP asap. Counseled patient on potential for adverse effects with medications prescribed/recommended today, ER and return-to-clinic precautions discussed, patient verbalized understanding.    Jaynee Eagles, PA-C 10/24/19 1028

## 2019-11-05 ENCOUNTER — Other Ambulatory Visit: Payer: Self-pay | Admitting: Family Medicine

## 2019-11-05 ENCOUNTER — Other Ambulatory Visit: Payer: Self-pay | Admitting: Internal Medicine

## 2019-11-05 DIAGNOSIS — I10 Essential (primary) hypertension: Secondary | ICD-10-CM

## 2019-11-05 DIAGNOSIS — I5022 Chronic systolic (congestive) heart failure: Secondary | ICD-10-CM

## 2019-11-05 DIAGNOSIS — I4901 Ventricular fibrillation: Secondary | ICD-10-CM

## 2019-11-05 MED FILL — AMIODARONE HCL 200 MG TAB: 200 | 30 days supply | Qty: 30 | Fill #3

## 2019-11-06 ENCOUNTER — Telehealth: Payer: Self-pay | Admitting: Internal Medicine

## 2019-11-06 DIAGNOSIS — I5022 Chronic systolic (congestive) heart failure: Secondary | ICD-10-CM

## 2019-11-06 DIAGNOSIS — I10 Essential (primary) hypertension: Secondary | ICD-10-CM

## 2019-11-06 DIAGNOSIS — I4901 Ventricular fibrillation: Secondary | ICD-10-CM

## 2019-11-06 NOTE — Telephone Encounter (Signed)
New Message   *STAT* If patient is at the pharmacy, call can be transferred to refill team.   1. Which medications need to be refilled? (please list name of each medication and dose if known) amiodarone (PACERONE) 200 MG tablet,  bisoprolol (ZEBETA) 10 MG tablet  2. Which pharmacy/location (including street and city if local pharmacy) is medication to be sent to? [place] [place]  3. Do they need a 30 day or 90 day supply? 90 day supply

## 2019-11-07 ENCOUNTER — Other Ambulatory Visit: Payer: Self-pay

## 2019-11-07 ENCOUNTER — Other Ambulatory Visit: Payer: Self-pay | Admitting: Family Medicine

## 2019-11-07 DIAGNOSIS — I4901 Ventricular fibrillation: Secondary | ICD-10-CM

## 2019-11-07 DIAGNOSIS — I5022 Chronic systolic (congestive) heart failure: Secondary | ICD-10-CM

## 2019-11-07 DIAGNOSIS — I10 Essential (primary) hypertension: Secondary | ICD-10-CM

## 2019-11-07 MED ORDER — BISOPROLOL FUMARATE 10 MG PO TABS: 10.0000 mg | ORAL_TABLET | Freq: Every day | ORAL | 1 refills | Status: DC

## 2019-11-07 MED ORDER — ENTRESTO 24-26 MG PO TABS: 1.0000 | ORAL_TABLET | Freq: Two times a day (BID) | ORAL | 6 refills | Status: DC

## 2019-11-07 MED ORDER — AMIODARONE HCL 200 MG PO TABS: 200.0000 mg | ORAL_TABLET | Freq: Every day | ORAL | 3 refills | Status: DC

## 2019-11-07 MED FILL — BISOPROLOL FUMARATE 10 MG T: 10 | 30 days supply | Qty: 30 | Fill #0

## 2019-11-07 MED FILL — ENTRESTO 24 MG-26 MG TABLET: 24-26 | 30 days supply | Qty: 60 | Fill #0

## 2019-11-07 NOTE — Telephone Encounter (Signed)
Pt's medications were sent to pt's pharmacy as requested. Confirmation received.  

## 2019-11-07 NOTE — Telephone Encounter (Signed)
Pt's medication was sent to pt's pharmacy as requested. Confirmation received.  °

## 2019-11-09 ENCOUNTER — Telehealth: Payer: Self-pay

## 2019-11-09 ENCOUNTER — Telehealth: Payer: Self-pay | Admitting: Family Medicine

## 2019-11-09 DIAGNOSIS — J4520 Mild intermittent asthma, uncomplicated: Secondary | ICD-10-CM

## 2019-11-09 MED ORDER — BUDESONIDE-FORMOTEROL FUMARATE 80-4.5 MCG/ACT IN AERO: 2.0000 | INHALATION_SPRAY | Freq: Two times a day (BID) | RESPIRATORY_TRACT | 0 refills | Status: AC

## 2019-11-09 MED FILL — SYMBICORT 80-4.5 MCG INH: 80-4.5 | 30 days supply | Qty: 10 | Fill #0

## 2019-11-09 NOTE — Telephone Encounter (Signed)
Spoke with patient to remind of missed remote transmission 

## 2019-11-09 NOTE — Telephone Encounter (Signed)
1) Medication(s) Requested (by name):  budesonide-formoterol (SYMBICORT) 80-4.5 MCG/ACT inhaler   2) Pharmacy of Choice: [facility] pharmacy   3) Special Requests:   Approved medications will be sent to the pharmacy, we will reach out if there is an issue.  Requests made after 3pm may not be addressed until the following business day!  If a patient is unsure of the name of the medication(s) please note and ask patient to call back when they are able to provide all info, do not send to responsible party until all information is available!

## 2019-11-09 NOTE — Telephone Encounter (Signed)
Rx sent 

## 2019-11-15 ENCOUNTER — Other Ambulatory Visit: Payer: Self-pay | Admitting: Internal Medicine

## 2019-11-15 DIAGNOSIS — I4901 Ventricular fibrillation: Secondary | ICD-10-CM

## 2019-11-15 DIAGNOSIS — I5022 Chronic systolic (congestive) heart failure: Secondary | ICD-10-CM

## 2019-11-15 DIAGNOSIS — I10 Essential (primary) hypertension: Secondary | ICD-10-CM

## 2019-11-15 MED ORDER — BISOPROLOL FUMARATE 10 MG PO TABS: 10.0000 mg | ORAL_TABLET | Freq: Every day | ORAL | 1 refills | Status: DC

## 2019-11-15 MED ORDER — ENTRESTO 24-26 MG PO TABS: 1.0000 | ORAL_TABLET | Freq: Two times a day (BID) | ORAL | 1 refills | Status: DC

## 2019-11-15 MED ORDER — AMIODARONE HCL 200 MG PO TABS: 200.0000 mg | ORAL_TABLET | Freq: Every day | ORAL | 1 refills | Status: DC

## 2019-11-15 NOTE — Telephone Encounter (Signed)
*  STAT* If patient is at the pharmacy, call can be transferred to refill team.   1. Which medications need to be refilled? (please list name of each medication and dose if known)  amiodarone (PACERONE) 200 MG tablet bisoprolol (ZEBETA) 10 MG tablet sacubitril-valsartan (ENTRESTO) 24-26 MG  2. Which pharmacy/location (including street and city if local pharmacy) is medication to be sent to? [place], [place]  3. Do they need a 30 day or 90 day supply? 90 day  [place]

## 2019-11-15 NOTE — Telephone Encounter (Signed)
Pt's medications were sent to pt's pharmacy as requested. Confirmation received.  

## 2019-11-16 ENCOUNTER — Other Ambulatory Visit: Payer: Self-pay

## 2019-11-16 ENCOUNTER — Ambulatory Visit: Payer: Managed Care, Other (non HMO) | Attending: Family Medicine | Admitting: Family Medicine

## 2019-11-16 DIAGNOSIS — Z5329 Procedure and treatment not carried out because of patient's decision for other reasons: Secondary | ICD-10-CM

## 2019-11-16 DIAGNOSIS — Z91199 Patient's noncompliance with other medical treatment and regimen due to unspecified reason: Secondary | ICD-10-CM

## 2019-11-16 NOTE — Progress Notes (Signed)
Patient ID: [name], male   DOB: 1971/01/25, 49 y.o.   MRN: [id number]   Patient was 20 minutes late and was asked to re-schedule

## 2019-11-29 MED FILL — ENTRESTO 24 MG-26 MG TABLET: 24-26 | 30 days supply | Qty: 60 | Fill #1

## 2019-11-29 MED FILL — BISOPROLOL FUMARATE 10 MG T: 10 | 30 days supply | Qty: 30 | Fill #0

## 2019-11-29 MED FILL — AMIODARONE HCL 200 MG TAB: 200 | 30 days supply | Qty: 30 | Fill #0

## 2019-11-30 ENCOUNTER — Telehealth: Payer: Self-pay | Admitting: Internal Medicine

## 2019-11-30 ENCOUNTER — Other Ambulatory Visit: Payer: Self-pay | Admitting: Internal Medicine

## 2019-11-30 DIAGNOSIS — I5022 Chronic systolic (congestive) heart failure: Secondary | ICD-10-CM

## 2019-11-30 DIAGNOSIS — I4901 Ventricular fibrillation: Secondary | ICD-10-CM

## 2019-11-30 DIAGNOSIS — I10 Essential (primary) hypertension: Secondary | ICD-10-CM

## 2019-11-30 MED ORDER — AMIODARONE HCL 200 MG PO TABS: 200.0000 mg | ORAL_TABLET | Freq: Every day | ORAL | 1 refills | Status: DC

## 2019-11-30 MED ORDER — BISOPROLOL FUMARATE 10 MG PO TABS: 10.0000 mg | ORAL_TABLET | Freq: Every day | ORAL | 1 refills | Status: DC

## 2019-11-30 MED ORDER — ENTRESTO 24-26 MG PO TABS: 1.0000 | ORAL_TABLET | Freq: Two times a day (BID) | ORAL | 1 refills | Status: DC

## 2019-11-30 NOTE — Telephone Encounter (Signed)
Informed pt that Rx got sent to [facility] several weeks ago. Pt wanted refills to go to [facility]. Rx sent in per request. Checked w/ pharmacy and confirmed receipt.

## 2019-11-30 NOTE — Telephone Encounter (Signed)
*  STAT* If patient is at the pharmacy, call can be transferred to refill team.   1. Which medications need to be refilled? (please list name of each medication and dose if known) amiodarone (PACERONE) 200 MG tablet / bisoprolol (ZEBETA) 10 MG tablet / sacubitril-valsartan (ENTRESTO) 24-26 MG  2. Which pharmacy/location (including street and city if local pharmacy) is medication to be sent to? [facility], [address]  3. Do they need a 30 day or 90 day supply? [place]

## 2019-11-30 NOTE — Telephone Encounter (Signed)
Patient returning call about refills being sent to pharmacy.

## 2019-12-03 ENCOUNTER — Encounter: Payer: Self-pay | Admitting: Family Medicine

## 2019-12-03 ENCOUNTER — Ambulatory Visit: Payer: Managed Care, Other (non HMO) | Attending: Family Medicine | Admitting: Family Medicine

## 2019-12-03 ENCOUNTER — Other Ambulatory Visit: Payer: Self-pay

## 2019-12-03 VITALS — BP 117/67 | HR 44 | Ht 74.0 in | Wt 230.0 lb

## 2019-12-03 DIAGNOSIS — R7989 Other specified abnormal findings of blood chemistry: Secondary | ICD-10-CM | POA: Diagnosis not present

## 2019-12-03 DIAGNOSIS — E785 Hyperlipidemia, unspecified: Secondary | ICD-10-CM | POA: Diagnosis not present

## 2019-12-03 DIAGNOSIS — M79671 Pain in right foot: Secondary | ICD-10-CM

## 2019-12-03 MED ORDER — PREDNISONE 20 MG PO TABS: ORAL_TABLET | ORAL | 1 refills | Status: DC

## 2019-12-03 NOTE — Progress Notes (Signed)
Established Patient Office Visit  Subjective:  Patient ID: [name], male    DOB: Dec 18, 1970  Age: 49 y.o. MRN: [id number]  CC:  Chief Complaint  Patient presents with  . Hypertension    HPI [name], 49 year old male, with hypertension, congestive heart failure, dilated cardiomyopathy, mild intermittent asthma, dyslipidemia and history of ventricular fibrillation, who was last evaluated by telemedicine visit on 10/30/2018 who presents secondary to complaint of issues with recurrent foot pain for which she was seen in the emergency department in the past, and which seems to respond to the use of prednisone.  He reports no injury to his foot but recently had onset of a sharp pain to the right midfoot, on the top and to the outside of the foot near the ankle.  Pain is worse with weightbearing/walking but is also present when he is lying down.  Pain ranges from a 6-8 on a 0-to-10 scale.  Over-the-counter medications slightly dull but did not take away the pain.  He also feels as if his foot has been more swollen since the onset of pain over the past 3 to 4 days.  He reports that he is about to travel out of the country and wants medication to help with the pain.  He wonders if his pain may be due to gout.  He denies any redness or increased warmth when he has the foot pain but he does feel as if part of his foot are swollen when he has onset of pain.  He would also like to have his cholesterol checked while he is here today's visit.  He has eaten today but his last meal being within the past 2 to 3 hours.  He reports that he does follow-up with cardiology and is currently on several medications which are on his current medication list.  He also takes a daily 81 mg aspirin and denies any unusual bruising or bleeding.  He denies any current issues with chest pain, no sensation of palpitations, no shortness of breath or cough and other than his right foot, he has not noticed any issues with  peripheral edema.  Past Medical History:  Diagnosis Date  . CHF (congestive heart failure) (HCC)    chronic  . DCM (dilated cardiomyopathy) ([place])   . Dyslipidemia   . HTN (hypertension)   . Hx of ventricular fibrillation    Hx of VFib arrest s/p AICD placement  . LV dysfunction    with an EF in 2007 at 34%    Past Surgical History:  Procedure Laterality Date  . CARDIAC DEFIBRILLATOR PLACEMENT    . PACEMAKER IMPLANT    . repeat echo  2008    demonstrated near normalization  . TRANSTHORACIC ECHOCARDIOGRAM  11/09/2005, 01/15/2009, 02/07/2010    Family History  Problem Relation Age of Onset  . Diabetes Father   . Heart Problems Father        unsure of what kind  . Asthma Maternal Uncle   . Diabetes Maternal Grandmother     Social History   Socioeconomic History  . Marital status: Married    Spouse name: Not on file  . Number of children: Not on file  . Years of education: Not on file  . Highest education level: Not on file  Occupational History    Employer: [employer]  Tobacco Use  . Smoking status: Former Smoker    Packs/day: 0.25    Years: 2.00    Pack years: 0.50  Types: Cigarettes    Quit date: 07/12/1998    Years since quitting: 21.4  . Smokeless tobacco: Never Used  Substance and Sexual Activity  . Alcohol use: No    Comment: Denies  . Drug use: No  . Sexual activity: Not on file  Other Topics Concern  . Not on file  Social History Narrative   The patient is married.  He is in native of [place],     living in [place] for 9 years.  He has a history of tobacco use,     but quit smoking a year ago.  Denies alcohol abuse.         Social Determinants of Health   Financial Resource Strain:   . Difficulty of Paying Living Expenses:   Food Insecurity:   . Worried About Charity fundraiser in the Last Year:   . Arboriculturist in the Last Year:   Transportation Needs:   . Film/video editor (Medical):   Marland Kitchen Lack of Transportation (Non-Medical):     Physical Activity:   . Days of Exercise per Week:   . Minutes of Exercise per Session:   Stress:   . Feeling of Stress :   Social Connections:   . Frequency of Communication with Friends and Family:   . Frequency of Social Gatherings with Friends and Family:   . Attends Religious Services:   . Active Member of Clubs or Organizations:   . Attends Archivist Meetings:   Marland Kitchen Marital Status:   Intimate Partner Violence:   . Fear of Current or Ex-Partner:   . Emotionally Abused:   Marland Kitchen Physically Abused:   . Sexually Abused:     Outpatient Medications Prior to Visit  Medication Sig Dispense Refill  . amiodarone (PACERONE) 200 MG tablet Take 1 tablet (200 mg total) by mouth daily. 90 tablet 1  . aspirin 81 MG tablet Take 1 tablet (81 mg total) by mouth 2 (two) times daily. 30 tablet 11  . bisoprolol (ZEBETA) 10 MG tablet Take 1 tablet (10 mg total) by mouth daily. 90 tablet 1  . budesonide-formoterol (SYMBICORT) 80-4.5 MCG/ACT inhaler Inhale 2 puffs into the lungs 2 (two) times daily. Must keep upcoming office visit for refills 1 Inhaler 0  . sacubitril-valsartan (ENTRESTO) 24-26 MG Take 1 tablet by mouth 2 (two) times daily. 180 tablet 1  . diclofenac sodium (VOLTAREN) 1 % GEL Apply 4 g topically 4 (four) times daily. As needed for knee pain (Patient not taking: Reported on 12/03/2019) 1 g 4  . predniSONE (DELTASONE) 20 MG tablet Take 2 tablets daily with breakfast. (Patient not taking: Reported on 12/03/2019) 10 tablet 0   No facility-administered medications prior to visit.    Allergies  Allergen Reactions  . Ace Inhibitors Cough    ROS Review of Systems  Constitutional: Positive for fatigue (Some disturbance in sleep due to foot pain). Negative for chills and fever.  HENT: Negative for sore throat and trouble swallowing.   Eyes: Negative for photophobia and visual disturbance.  Respiratory: Negative for cough and shortness of breath.   Cardiovascular: Negative for chest  pain, palpitations and leg swelling.  Gastrointestinal: Negative for abdominal pain, blood in stool, constipation, diarrhea and nausea.  Endocrine: Negative for polydipsia, polyphagia and polyuria.  Genitourinary: Negative for dysuria and frequency.  Musculoskeletal: Positive for arthralgias and gait problem. Negative for back pain.  Neurological: Negative for dizziness and headaches.  Hematological: Negative for adenopathy. Does not bruise/bleed  easily.      Objective:    Physical Exam  Constitutional: He is oriented to person, place, and time. He appears well-developed and well-nourished.  Cardiovascular: Normal rate and regular rhythm.  Pulmonary/Chest: Effort normal and breath sounds normal.  Abdominal: Soft. There is no abdominal tenderness. There is no rebound and no guarding.  Musculoskeletal:        General: Tenderness and edema present.     Comments: Patient with a mild right foot pedal edema.  Palpable pedal and posterior tibial pulses.  No cyanosis or erythema.  Patient with tenderness at the top of the foot closer to the ankle and tenderness to the lateral foot and in front of and slightly below the lateral malleolus.  No increased warmth of the skin.  Neurological: He is alert and oriented to person, place, and time.  Skin: Skin is dry. No rash noted. No erythema.  Psychiatric: He has a normal mood and affect. His behavior is normal.  Nursing note and vitals reviewed.   BP 117/67   Pulse (!) 44   Ht 6\' 2"  (1.88 m)   Wt 230 lb (104.3 kg)   SpO2 97%   BMI 29.53 kg/m  Wt Readings from Last 3 Encounters:  12/03/19 230 lb (104.3 kg)  05/18/19 241 lb (109.3 kg)  06/14/18 234 lb (106.1 kg)     Health Maintenance Due  Topic Date Due  . COVID-19 Vaccine (1) Never done  . TETANUS/TDAP  Never done    There are no preventive care reminders to display for this patient.  Lab Results  Component Value Date   TSH 2.260 05/18/2019   Lab Results  Component Value Date    WBC 7.8 01/21/2018   HGB 16.8 01/21/2018   HCT 50.5 01/21/2018   MCV 86.6 01/21/2018   PLT 233 01/21/2018   Lab Results  Component Value Date   NA 139 06/12/2019   K 4.4 06/12/2019   CO2 25 06/12/2019   GLUCOSE 110 (H) 06/12/2019   BUN 16 06/12/2019   CREATININE 1.34 (H) 06/12/2019   BILITOT 0.5 05/18/2019   ALKPHOS 68 05/18/2019   AST 29 05/18/2019   ALT 31 05/18/2019   PROT 6.6 05/18/2019   ALBUMIN 4.2 05/18/2019   CALCIUM 9.4 06/12/2019   ANIONGAP 8 01/21/2018   Lab Results  Component Value Date   CHOL 202 (H) 11/16/2018   Lab Results  Component Value Date   HDL 27 (L) 11/16/2018   Lab Results  Component Value Date   LDLCALC 137 (H) 11/16/2018   Lab Results  Component Value Date   TRIG 190 (H) 11/16/2018   Lab Results  Component Value Date   CHOLHDL 7.5 (H) 11/16/2018   Lab Results  Component Value Date   HGBA1C 5.6 11/16/2018      Assessment & Plan:  1. Right foot pain Patient reports that he has had recurrent issues with pain and swelling in the right foot and on review of chart, he was seen in the emergency department on 10/15/2018 for foot pain and prescribed prednisone.  X-ray of the right foot at that time did not show any significant arthropathy.  Patient did have some calcaneal spurring which may be responsible for the occasional pain that he has at the posterior heel/Achilles tendon area.  Will check uric acid level to see if his foot pain may be gout related.  He is aware that he should eat before taking prednisone to help avoid stomach upset. - Uric  Acid - predniSONE (DELTASONE) 20 MG tablet; Take 3 pills today then 2 pills once per day for 2 days, 1 pill daily for 2 days then 1/2 pill daily for 4 days; take after eating  Dispense: 11 tablet; Refill: 1  2. Elevated serum creatinine He has had past elevated serum creatinine and this could be contributing to issues with clearance of uric acid.  Electrolytes will be rechecked at today's visit. - Basic  Metabolic Panel  3. Dyslipidemia, goal LDL below 70 Patient would like to have lipid panel and discussed return for fasting blood work.  Patient will be going out of the country soon and does not believe that he will be able to make it back to have fasting blood work before his trip.  Order placed for future lipid panel and patient will schedule fasting lab appointment at his convenience. - Lipid panel; Future   Meds ordered this encounter  Medications  . predniSONE (DELTASONE) 20 MG tablet    Sig: Take 3 pills today then 2 pills once per day for 2 days, 1 pill daily for 2 days then 1/2 pill daily for 4 days; take after eating    Dispense:  11 tablet    Refill:  1    Follow-up: Return for  foot pain- f/u as needed; schedule fasting lab visit for lipids.    Antony Blackbird, MD

## 2019-12-03 NOTE — Progress Notes (Signed)
Pain in top of right foot.

## 2019-12-04 LAB — BASIC METABOLIC PANEL WITH GFR
BUN/Creatinine Ratio: 12 (ref 9–20)
BUN: 15 mg/dL (ref 6–24)
CO2: 21 mmol/L (ref 20–29)
Calcium: 9.5 mg/dL (ref 8.7–10.2)
Chloride: 109 mmol/L — ABNORMAL HIGH (ref 96–106)
Creatinine, Ser: 1.27 mg/dL (ref 0.76–1.27)
GFR calc Af Amer: 77 mL/min/1.73
GFR calc non Af Amer: 66 mL/min/1.73
Glucose: 103 mg/dL — ABNORMAL HIGH (ref 65–99)
Potassium: 4.6 mmol/L (ref 3.5–5.2)
Sodium: 144 mmol/L (ref 134–144)

## 2019-12-04 LAB — URIC ACID: Uric Acid: 7.8 mg/dL (ref 3.8–8.4)

## 2019-12-05 ENCOUNTER — Encounter: Payer: Self-pay | Admitting: Family Medicine

## 2019-12-21 ENCOUNTER — Encounter: Payer: Self-pay | Admitting: *Deleted

## 2020-01-24 ENCOUNTER — Ambulatory Visit (INDEPENDENT_AMBULATORY_CARE_PROVIDER_SITE_OTHER): Payer: Managed Care, Other (non HMO) | Admitting: *Deleted

## 2020-01-24 DIAGNOSIS — I4901 Ventricular fibrillation: Secondary | ICD-10-CM | POA: Diagnosis not present

## 2020-01-25 LAB — CUP PACEART REMOTE DEVICE CHECK
Battery Remaining Longevity: 16 mo
Battery Remaining Percentage: 13 %
Battery Voltage: 2.71 V
Brady Statistic RV Percent Paced: 6.6 %
Date Time Interrogation Session: 20210715203137
HighPow Impedance: 50 Ohm
Implantable Lead Implant Date: 20110805
Implantable Lead Location: 753860
Implantable Lead Model: 185
Implantable Lead Serial Number: 39993
Implantable Pulse Generator Implant Date: 20110805
Lead Channel Impedance Value: 390 Ohm
Lead Channel Pacing Threshold Amplitude: 0.75 V
Lead Channel Pacing Threshold Pulse Width: 0.4 ms
Lead Channel Sensing Intrinsic Amplitude: 11.7 mV
Lead Channel Setting Pacing Amplitude: 2.5 V
Lead Channel Setting Pacing Pulse Width: 0.4 ms
Lead Channel Setting Sensing Sensitivity: 0.5 mV
Pulse Gen Serial Number: 728671

## 2020-01-25 NOTE — Progress Notes (Signed)
Remote ICD transmission.   

## 2020-03-24 MED FILL — AMIODARONE HCL 200 MG TAB: 200 | 30 days supply | Qty: 30 | Fill #1

## 2020-03-24 MED FILL — BISOPROLOL FUMARATE 10 MG T: 10 | 30 days supply | Qty: 30 | Fill #1

## 2020-03-28 MED FILL — ENTRESTO 24 MG-26 MG TABLET: 24-26 | 30 days supply | Qty: 60 | Fill #2

## 2020-04-18 MED FILL — AMIODARONE HCL 200 MG TAB: 200 | 30 days supply | Qty: 30 | Fill #2

## 2020-04-18 MED FILL — BISOPROLOL FUMARATE 10 MG T: 10 | 30 days supply | Qty: 30 | Fill #2

## 2020-04-25 ENCOUNTER — Encounter: Payer: Self-pay | Admitting: Family Medicine

## 2020-04-25 ENCOUNTER — Ambulatory Visit: Payer: Managed Care, Other (non HMO) | Attending: Family Medicine | Admitting: Family Medicine

## 2020-04-25 ENCOUNTER — Other Ambulatory Visit: Payer: Self-pay

## 2020-04-25 VITALS — BP 115/72 | HR 60 | Wt 236.6 lb

## 2020-04-25 DIAGNOSIS — R194 Change in bowel habit: Secondary | ICD-10-CM

## 2020-04-25 DIAGNOSIS — Z1211 Encounter for screening for malignant neoplasm of colon: Secondary | ICD-10-CM

## 2020-04-25 DIAGNOSIS — R140 Abdominal distension (gaseous): Secondary | ICD-10-CM

## 2020-04-25 DIAGNOSIS — Z79899 Other long term (current) drug therapy: Secondary | ICD-10-CM

## 2020-04-25 DIAGNOSIS — E785 Hyperlipidemia, unspecified: Secondary | ICD-10-CM | POA: Diagnosis not present

## 2020-04-25 NOTE — Progress Notes (Signed)
Fullness in stomach and worse after eating or drinking

## 2020-04-25 NOTE — Progress Notes (Signed)
Established Patient Office Visit  Subjective:  Patient ID: [name], male    DOB: 1970/08/25  Age: 48 y.o. MRN: [id number]  CC:  Chief Complaint  Patient presents with  . Bloated    HPI [name], 49 year old male, seen in follow-up of hyperlipidemia/dyslipidemia.  He also has CHF, hypertension and dilated cardiomyopathy along with history of ventricular fibrillation with cardiac arrest and status post AICD placement.  He continues to follow-up with cardiology.  At today's visit, he reports that he is not fasting but can return for fasting blood work for lipid panel in follow-up of dyslipidemia.  He reports that he is trying to follow a healthy diet.  He is not currently on medication to lower his cholesterol.          He also reports at today's visit that he has had issues with feeling full shortly after he starts eating as well as sensation of feeling bloated.  He reports that a few months ago he also had issues with constipation and felt that in addition to his stool being harder than normal which caused him to have to strain to have a bowel movement, that his stool was also smaller in size than normal.  He denies any blood in the stool and no black stools.  No issues with abdominal pain.  He is interested in being referred to gastroenterology for evaluation of bloating and patient has never had a colonoscopy to screen for colon cancer.  He feels that eliminating dairy and making other dietary changes did help with the constipation and change in bowel habits that he was previously experiencing.          He has had no additional issues with foot pain or swelling and states that the prednisone prescribed at his last visit led to resolution of his foot pain within 24 hours.  Past Medical History:  Diagnosis Date  . CHF (congestive heart failure) (HCC)    chronic  . DCM (dilated cardiomyopathy) ([facility])   . Dyslipidemia   . HTN (hypertension)   . Hx of ventricular fibrillation    Hx  of VFib arrest s/p AICD placement  . LV dysfunction    with an EF in 2007 at 34%    Past Surgical History:  Procedure Laterality Date  . CARDIAC DEFIBRILLATOR PLACEMENT    . PACEMAKER IMPLANT    . repeat echo  2008    demonstrated near normalization  . TRANSTHORACIC ECHOCARDIOGRAM  11/09/2005, 01/15/2009, 02/07/2010    Family History  Problem Relation Age of Onset  . Diabetes Father   . Heart Problems Father        unsure of what kind  . Asthma Maternal Uncle   . Diabetes Maternal Grandmother     Social History   Socioeconomic History  . Marital status: Married    Spouse name: Not on file  . Number of children: Not on file  . Years of education: Not on file  . Highest education level: Not on file  Occupational History    Employer: [employer]  Tobacco Use  . Smoking status: Former Smoker    Packs/day: 0.25    Years: 2.00    Pack years: 0.50    Types: Cigarettes    Quit date: 07/12/1998    Years since quitting: 21.8  . Smokeless tobacco: Never Used  Vaping Use  . Vaping Use: Never used  Substance and Sexual Activity  . Alcohol use: No    Comment: Denies  .  Drug use: No  . Sexual activity: Not on file  Other Topics Concern  . Not on file  Social History Narrative   The patient is married.  He is in native of [place],     living in [place] for 9 years.  He has a history of tobacco use,     but quit smoking a year ago.  Denies alcohol abuse.         Social Determinants of Health   Financial Resource Strain:   . Difficulty of Paying Living Expenses: Not on file  Food Insecurity:   . Worried About Charity fundraiser in the Last Year: Not on file  . Ran Out of Food in the Last Year: Not on file  Transportation Needs:   . Lack of Transportation (Medical): Not on file  . Lack of Transportation (Non-Medical): Not on file  Physical Activity:   . Days of Exercise per Week: Not on file  . Minutes of Exercise per Session: Not on file  Stress:   . Feeling of Stress :  Not on file  Social Connections:   . Frequency of Communication with Friends and Family: Not on file  . Frequency of Social Gatherings with Friends and Family: Not on file  . Attends Religious Services: Not on file  . Active Member of Clubs or Organizations: Not on file  . Attends Archivist Meetings: Not on file  . Marital Status: Not on file  Intimate Partner Violence:   . Fear of Current or Ex-Partner: Not on file  . Emotionally Abused: Not on file  . Physically Abused: Not on file  . Sexually Abused: Not on file    Outpatient Medications Prior to Visit  Medication Sig Dispense Refill  . amiodarone (PACERONE) 200 MG tablet Take 1 tablet (200 mg total) by mouth daily. 90 tablet 1  . aspirin 81 MG tablet Take 1 tablet (81 mg total) by mouth 2 (two) times daily. 30 tablet 11  . bisoprolol (ZEBETA) 10 MG tablet Take 1 tablet (10 mg total) by mouth daily. 90 tablet 1  . budesonide-formoterol (SYMBICORT) 80-4.5 MCG/ACT inhaler Inhale 2 puffs into the lungs 2 (two) times daily. Must keep upcoming office visit for refills 1 Inhaler 0  . diclofenac sodium (VOLTAREN) 1 % GEL Apply 4 g topically 4 (four) times daily. As needed for knee pain 1 g 4  . sacubitril-valsartan (ENTRESTO) 24-26 MG Take 1 tablet by mouth 2 (two) times daily. 180 tablet 1  . predniSONE (DELTASONE) 20 MG tablet Take 3 pills today then 2 pills once per day for 2 days, 1 pill daily for 2 days then 1/2 pill daily for 4 days; take after eating (Patient not taking: Reported on 04/25/2020) 11 tablet 1   No facility-administered medications prior to visit.    Allergies  Allergen Reactions  . Ace Inhibitors Cough    ROS Review of Systems  Constitutional: Negative for chills and fever.  HENT: Negative for sore throat and trouble swallowing.   Eyes: Negative for photophobia and visual disturbance.  Respiratory: Negative for cough and shortness of breath.   Cardiovascular: Negative for chest pain, palpitations and  leg swelling.  Gastrointestinal: Negative for blood in stool, constipation, diarrhea and nausea.       Abdominal bloating, especially soon after eating  Endocrine: Negative for polydipsia, polyphagia and polyuria.  Genitourinary: Negative for dysuria and frequency.  Musculoskeletal: Negative for arthralgias and back pain.  Skin: Negative for rash and  wound.  Neurological: Negative for dizziness and headaches.  Hematological: Negative for adenopathy. Does not bruise/bleed easily.  Psychiatric/Behavioral: Negative for sleep disturbance. The patient is not nervous/anxious.       Objective:    Physical Exam  BP 115/72 (BP Location: Right Arm, Patient Position: Sitting)   Pulse 60   Wt 236 lb 9.6 oz (107.3 kg)   SpO2 98%   BMI 30.38 kg/m  Wt Readings from Last 3 Encounters:  04/25/20 236 lb 9.6 oz (107.3 kg)  12/03/19 230 lb (104.3 kg)  05/18/19 241 lb (109.3 kg)     Health Maintenance Due  Topic Date Due  . Hepatitis C Screening  Never done  . COVID-19 Vaccine (1) Never done  . TETANUS/TDAP  Never done  . INFLUENZA VACCINE  02/10/2020   Patient offered influenza immunization however he reports that he received this already at his workplace.  He additionally reports that he has received his COVID-19 vaccination however he reports that he misplaced his immunization card.  Lab Results  Component Value Date   TSH 2.260 05/18/2019   Lab Results  Component Value Date   WBC 7.8 01/21/2018   HGB 16.8 01/21/2018   HCT 50.5 01/21/2018   MCV 86.6 01/21/2018   PLT 233 01/21/2018   Lab Results  Component Value Date   NA 144 12/03/2019   K 4.6 12/03/2019   CO2 21 12/03/2019   GLUCOSE 103 (H) 12/03/2019   BUN 15 12/03/2019   CREATININE 1.27 12/03/2019   BILITOT 0.5 05/18/2019   ALKPHOS 68 05/18/2019   AST 29 05/18/2019   ALT 31 05/18/2019   PROT 6.6 05/18/2019   ALBUMIN 4.2 05/18/2019   CALCIUM 9.5 12/03/2019   ANIONGAP 8 01/21/2018   Lab Results  Component Value  Date   CHOL 202 (H) 11/16/2018   Lab Results  Component Value Date   HDL 27 (L) 11/16/2018   Lab Results  Component Value Date   LDLCALC 137 (H) 11/16/2018   Lab Results  Component Value Date   TRIG 190 (H) 11/16/2018   Lab Results  Component Value Date   CHOLHDL 7.5 (H) 11/16/2018   Lab Results  Component Value Date   HGBA1C 5.6 11/16/2018      Assessment & Plan:  1. Abdominal bloating; 2.  Change in bowel habits He reports that he has continued issues with abdominal bloating and had constipation/change in bowel habits about a month to 2 months ago but this is now resolved.  He will be referred to gastroenterology in follow-up of his complaints of abdominal bloating. - Ambulatory referral to Gastroenterology  3. Encounter for screening colonoscopy As the age for initial screening colonoscopy has been lowered to 68, patient is being referred to gastroenterology for screening colonoscopy. - Ambulatory referral to Gastroenterology  4. Dyslipidemia, goal LDL below 70; 5.  Encounter for long-term current use of medication Patient's last lipid panel was done 11/16/2018 and at that time, he had triglycerides of 190, HDL low at 27 and LDL of 137.  He is not currently on medication to help with dyslipidemia.  He is to return for fasting lipid panel as well as comprehensive metabolic panel in follow-up of the use of multiple medications for treatment of hypertension, CHF/cardiomyopathy and to check liver enzymes prior to statin therapy if needed.  Educational material on dyslipidemia provided as part of after visit summary.  Healthy diet and weight loss encouraged. - Lipid panel; Future - Comprehensive metabolic panel; Future  Follow-up: Return in about 5 months (around 09/19/2020) for chronic issues; return for fasting labs.   Antony Blackbird, MD

## 2020-04-25 NOTE — Patient Instructions (Signed)
Abdominal Bloating When you have abdominal bloating, your abdomen may feel full, tight, or painful. It may also look bigger than normal or swollen (distended). Common causes of abdominal bloating include:  Swallowing air.  Constipation.  Problems digesting food.  Eating too much.  Irritable bowel syndrome. This is a condition that affects the large intestine.  Lactose intolerance. This is an inability to digest lactose, a natural sugar in dairy products.  Celiac disease. This is a condition that affects the ability to digest gluten, a protein found in some grains.  Gastroparesis. This is a condition that slows down the movement of food in the stomach and small intestine. It is more common in people with diabetes mellitus.  Gastroesophageal reflux disease (GERD). This is a digestive condition that makes stomach acid flow back into the esophagus.  Urinary retention. This means that the body is holding onto urine, and the bladder cannot be emptied all the way. Follow these instructions at home: Eating and drinking  Avoid eating too much.  Try not to swallow air while talking or eating.  Avoid eating while lying down.  Avoid these foods and drinks: ? Foods that cause gas, such as broccoli, cabbage, cauliflower, and baked beans. ? Carbonated drinks. ? Hard candy. ? Chewing gum. Medicines  Take over-the-counter and prescription medicines only as told by your health care provider.  Take probiotic medicines. These medicines contain live bacteria or yeasts that can help digestion.  Take coated peppermint oil capsules. Activity  Try to exercise regularly. Exercise may help to relieve bloating that is caused by gas and relieve constipation. General instructions  Keep all follow-up visits as told by your health care provider. This is important. Contact a health care provider if:  You have nausea and vomiting.  You have diarrhea.  You have abdominal pain.  You have unusual  weight loss or weight gain.  You have severe pain, and medicines do not help. Get help right away if:  You have severe chest pain.  You have trouble breathing.  You have shortness of breath.  You have trouble urinating.  You have darker urine than normal.  You have blood in your stools or have dark, tarry stools. Summary  Abdominal bloating means that the abdomen is swollen.  Common causes of abdominal bloating are swallowing air, constipation, and problems digesting food.  Avoid eating too much and avoid swallowing air.  Avoid foods that cause gas, carbonated drinks, hard candy, and chewing gum. This information is not intended to replace advice given to you by your health care provider. Make sure you discuss any questions you have with your health care provider. Document Revised: 10/16/2018 Document Reviewed: 07/30/2016 Elsevier Patient Education  [place].  Dyslipidemia Dyslipidemia is an imbalance of waxy, fat-like substances (lipids) in the blood. The body needs lipids in small amounts. Dyslipidemia often involves a high level of cholesterol or triglycerides, which are types of lipids. Common forms of dyslipidemia include:  High levels of LDL cholesterol. LDL is the type of cholesterol that causes fatty deposits (plaques) to build up in the blood vessels that carry blood away from your heart (arteries).  Low levels of HDL cholesterol. HDL cholesterol is the type of cholesterol that protects against heart disease. High levels of HDL remove the LDL buildup from arteries.  High levels of triglycerides. Triglycerides are a fatty substance in the blood that is linked to a buildup of plaques in the arteries. What are the causes? Primary dyslipidemia is caused by  changes (mutations) in genes that are passed down through families (inherited). These mutations cause several types of dyslipidemia. Secondary dyslipidemia is caused by lifestyle choices and diseases that lead  to dyslipidemia, such as:  Eating a diet that is high in animal fat.  Not getting enough exercise.  Having diabetes, kidney disease, liver disease, or thyroid disease.  Drinking large amounts of alcohol.  Using certain medicines. What increases the risk? You are more likely to develop this condition if you are an older man or if you are a woman who has gone through menopause. Other risk factors include:  Having a family history of dyslipidemia.  Taking certain medicines, including birth control pills, steroids, some diuretics, and beta-blockers.  Smoking cigarettes.  Eating a high-fat diet.  Having certain medical conditions such as diabetes, polycystic ovary syndrome (PCOS), kidney disease, liver disease, or hypothyroidism.  Not exercising regularly.  Being overweight or obese with too much belly fat. What are the signs or symptoms? In most cases, dyslipidemia does not usually cause any symptoms. In severe cases, very high lipid levels can cause:  Fatty bumps under the skin (xanthomas).  White or gray ring around the black center (pupil) of the eye. Very high triglyceride levels can cause inflammation of the pancreas (pancreatitis). How is this diagnosed? Your health care provider may diagnose dyslipidemia based on a routine blood test (fasting blood test). Because most people do not have symptoms of the condition, this blood testing (lipid profile) is done on adults age 47 and older and is repeated every 5 years. This test checks:  Total cholesterol. This measures the total amount of cholesterol in your blood, including LDL cholesterol, HDL cholesterol, and triglycerides. A healthy number is below 200.  LDL cholesterol. The target number for LDL cholesterol is different for each person, depending on individual risk factors. Ask your health care provider what your LDL cholesterol should be.  HDL cholesterol. An HDL level of 60 or higher is best because it helps to protect  against heart disease. A number below 75 for men or below 29 for women increases the risk for heart disease.  Triglycerides. A healthy triglyceride number is below 150. If your lipid profile is abnormal, your health care provider may do other blood tests. How is this treated? Treatment depends on the type of dyslipidemia that you have and your other risk factors for heart disease and stroke. Your health care provider will have a target range for your lipid levels based on this information. For many people, this condition may be treated by lifestyle changes, such as diet and exercise. Your health care provider may recommend that you:  Get regular exercise.  Make changes to your diet.  Quit smoking if you smoke. If diet changes and exercise do not help you reach your goals, your health care provider may also prescribe medicine to lower lipids. The most commonly prescribed type of medicine lowers your LDL cholesterol (statin drug). If you have a high triglyceride level, your provider may prescribe another type of drug (fibrate) or an omega-3 fish oil supplement, or both. Follow these instructions at home:  Eating and drinking  Follow instructions from your health care provider or dietitian about eating or drinking restrictions.  Eat a healthy diet as told by your health care provider. This can help you reach and maintain a healthy weight, lower your LDL cholesterol, and raise your HDL cholesterol. This may include: ? Limiting your calories, if you are overweight. ? Eating more fruits, vegetables,  whole grains, fish, and lean meats. ? Limiting saturated fat, trans fat, and cholesterol.  If you drink alcohol: ? Limit how much you use. ? Be aware of how much alcohol is in your drink. In the [place] one drink equals one 12 oz bottle of beer (355 mL), one 5 oz glass of wine (148 mL), or one 1 oz glass of hard liquor (44 mL).  Do not drink alcohol if: ? Your health care provider tells you not to  drink. ? You are pregnant, may be pregnant, or are planning to become pregnant. Activity  Get regular exercise. Start an exercise and strength training program as told by your health care provider. Ask your health care provider what activities are safe for you. Your health care provider may recommend: ? 30 minutes of aerobic activity 4-6 days a week. Brisk walking is an example of aerobic activity. ? Strength training 2 days a week. General instructions  Do not use any products that contain nicotine or tobacco, such as cigarettes, e-cigarettes, and chewing tobacco. If you need help quitting, ask your health care provider.  Take over-the-counter and prescription medicines only as told by your health care provider. This includes supplements.  Keep all follow-up visits as told by your health care provider. Contact a health care provider if:  You are: ? Having trouble sticking to your exercise or diet plan. ? Struggling to quit smoking or control your use of alcohol. Summary  Dyslipidemia often involves a high level of cholesterol or triglycerides, which are types of lipids.  Treatment depends on the type of dyslipidemia that you have and your other risk factors for heart disease and stroke.  For many people, treatment starts with lifestyle changes, such as diet and exercise.  Your health care provider may prescribe medicine to lower lipids. This information is not intended to replace advice given to you by your health care provider. Make sure you discuss any questions you have with your health care provider. Document Revised: 02/20/2018 Document Reviewed: 01/27/2018 Elsevier Patient Education  [place].

## 2020-04-29 ENCOUNTER — Ambulatory Visit: Payer: Managed Care, Other (non HMO) | Admitting: Internal Medicine

## 2020-04-29 ENCOUNTER — Other Ambulatory Visit: Payer: Self-pay

## 2020-04-29 ENCOUNTER — Encounter: Payer: Self-pay | Admitting: Internal Medicine

## 2020-04-29 VITALS — BP 118/72 | HR 40 | Ht 74.0 in | Wt 237.2 lb

## 2020-04-29 DIAGNOSIS — Z79899 Other long term (current) drug therapy: Secondary | ICD-10-CM | POA: Diagnosis not present

## 2020-04-29 DIAGNOSIS — I4901 Ventricular fibrillation: Secondary | ICD-10-CM

## 2020-04-29 DIAGNOSIS — I5022 Chronic systolic (congestive) heart failure: Secondary | ICD-10-CM

## 2020-04-29 DIAGNOSIS — Z95810 Presence of automatic (implantable) cardiac defibrillator: Secondary | ICD-10-CM | POA: Diagnosis not present

## 2020-04-29 LAB — CUP PACEART INCLINIC DEVICE CHECK
Battery Remaining Longevity: 11 mo
Brady Statistic RV Percent Paced: 8.3 %
Date Time Interrogation Session: 20211019122849
HighPow Impedance: 55.4783
Implantable Lead Implant Date: 20110805
Implantable Lead Location: 753860
Implantable Lead Model: 185
Implantable Lead Serial Number: 39993
Implantable Pulse Generator Implant Date: 20110805
Lead Channel Impedance Value: 400 Ohm
Lead Channel Pacing Threshold Amplitude: 0.75 V
Lead Channel Pacing Threshold Pulse Width: 0.4 ms
Lead Channel Sensing Intrinsic Amplitude: 11.7 mV
Lead Channel Setting Pacing Amplitude: 2.5 V
Lead Channel Setting Pacing Pulse Width: 0.4 ms
Lead Channel Setting Sensing Sensitivity: 0.5 mV
Pulse Gen Serial Number: 728671

## 2020-04-29 NOTE — Patient Instructions (Addendum)
Medication Instructions:  Your physician recommends that you continue on your current medications as directed. Please refer to the Current Medication list given to you today.  Labwork: None ordered.  Testing/Procedures: Your physician has recommended that you have a pulmonary function test. Pulmonary Function Tests are a group of tests that measure how well air moves in and out of your lungs.  You will be called to schedule this test with [facility].  Follow-Up: Your physician wants you to follow-up in: one year with Dr. Lovena Le.   You will receive a reminder letter in the mail two months in advance. If you don't receive a letter, please call our office to schedule the follow-up appointment.  Remote monitoring is used to monitor your ICD from home. This monitoring reduces the number of office visits required to check your device to one time per year. It allows [place] to keep an eye on the functioning of your device to ensure it is working properly. You are scheduled for a device check from home on 07/24/2020. You may send your transmission at any time that day. If you have a wireless device, the transmission will be sent automatically. After your physician reviews your transmission, you will receive a postcard with your next transmission date.  Any Other Special Instructions Will Be Listed Below (If Applicable).  If you need a refill on your cardiac medications before your next appointment, please call your pharmacy.

## 2020-04-29 NOTE — Progress Notes (Signed)
HPI Mr. Kawahara returns after a 2 year absence. He is a pleasant 49 yo man with a h/o non-ischemic CM, VF, s/p ICD insertion, initially for primary prevention who has had recurent VT and VF and been placed on amiodarone. He has not had any additional ventricular arrhythmias over the past 2 years. He has remained on amiodarone. He has been living part of the time in the [place], his native country. He has had some problems with dyspnea. He denies a cough or hemoptysis or edema. His echo showed an EF of 45%.  Allergies  Allergen Reactions  . Ace Inhibitors Cough     Current Outpatient Medications  Medication Sig Dispense Refill  . amiodarone (PACERONE) 200 MG tablet Take 1 tablet (200 mg total) by mouth daily. 90 tablet 1  . aspirin 81 MG tablet Take 1 tablet (81 mg total) by mouth 2 (two) times daily. 30 tablet 11  . bisoprolol (ZEBETA) 10 MG tablet Take 1 tablet (10 mg total) by mouth daily. 90 tablet 1  . budesonide-formoterol (SYMBICORT) 80-4.5 MCG/ACT inhaler Inhale 2 puffs into the lungs 2 (two) times daily. Must keep upcoming office visit for refills 1 Inhaler 0  . sacubitril-valsartan (ENTRESTO) 24-26 MG Take 1 tablet by mouth 2 (two) times daily. 180 tablet 1   No current facility-administered medications for this visit.     Past Medical History:  Diagnosis Date  . CHF (congestive heart failure) (HCC)    chronic  . DCM (dilated cardiomyopathy) ([facility])   . Dyslipidemia   . HTN (hypertension)   . Hx of ventricular fibrillation    Hx of VFib arrest s/p AICD placement  . LV dysfunction    with an EF in 2007 at 34%    ROS:   All systems reviewed and negative except as noted in the HPI.   Past Surgical History:  Procedure Laterality Date  . CARDIAC DEFIBRILLATOR PLACEMENT    . PACEMAKER IMPLANT    . repeat echo  2008    demonstrated near normalization  . TRANSTHORACIC ECHOCARDIOGRAM  11/09/2005, 01/15/2009, 02/07/2010     Family History  Problem Relation Age of  Onset  . Diabetes Father   . Heart Problems Father        unsure of what kind  . Asthma Maternal Uncle   . Diabetes Maternal Grandmother      Social History   Socioeconomic History  . Marital status: Married    Spouse name: Not on file  . Number of children: Not on file  . Years of education: Not on file  . Highest education level: Not on file  Occupational History    Employer: [employer]  Tobacco Use  . Smoking status: Former Smoker    Packs/day: 0.25    Years: 2.00    Pack years: 0.50    Types: Cigarettes    Quit date: 07/12/1998    Years since quitting: 21.8  . Smokeless tobacco: Never Used  Vaping Use  . Vaping Use: Never used  Substance and Sexual Activity  . Alcohol use: No    Comment: Denies  . Drug use: No  . Sexual activity: Not on file  Other Topics Concern  . Not on file  Social History Narrative   The patient is married.  He is in native of [place],     living in [place] for 9 years.  He has a history of tobacco use,     but quit smoking a year  ago.  Denies alcohol abuse.         Social Determinants of Health   Financial Resource Strain:   . Difficulty of Paying Living Expenses: Not on file  Food Insecurity:   . Worried About Charity fundraiser in the Last Year: Not on file  . Ran Out of Food in the Last Year: Not on file  Transportation Needs:   . Lack of Transportation (Medical): Not on file  . Lack of Transportation (Non-Medical): Not on file  Physical Activity:   . Days of Exercise per Week: Not on file  . Minutes of Exercise per Session: Not on file  Stress:   . Feeling of Stress : Not on file  Social Connections:   . Frequency of Communication with Friends and Family: Not on file  . Frequency of Social Gatherings with Friends and Family: Not on file  . Attends Religious Services: Not on file  . Active Member of Clubs or Organizations: Not on file  . Attends Archivist Meetings: Not on file  . Marital Status: Not on file    Intimate Partner Violence:   . Fear of Current or Ex-Partner: Not on file  . Emotionally Abused: Not on file  . Physically Abused: Not on file  . Sexually Abused: Not on file     BP 118/72   Pulse (!) 40   Ht 6\' 2"  (1.88 m)   Wt 237 lb 3.2 oz (107.6 kg)   SpO2 96%   BMI 30.45 kg/m   Physical Exam:  Well appearing NAD HEENT: Unremarkable Neck:  No JVD, no thyromegally Lymphatics:  No adenopathy Back:  No CVA tenderness Lungs:  Clear with no wheezes HEART:  Regular brady rhythm, no murmurs, no rubs, no clicks Abd:  soft, positive bowel sounds, no organomegally, no rebound, no guarding Ext:  2 plus pulses, no edema, no cyanosis, no clubbing Skin:  No rashes no nodules Neuro:  CN II through XII intact, motor grossly intact  EKG -  Sinus brady  DEVICE  Normal device function.  See PaceArt for details.   Assess/Plan: 1. Non-ischemic CM - he has an improvement of his LV function. He will continue his current meds. He is not on a beta  Blocker due to bradycardia 2. VT/VF - he has done well on amiodarone.  3. Sinus node dysfunction - I asked him to reduce his dose of amio. When his device reaches ERI, we will plan to add an atrial lead if his vein is open. 4. Sob - I have asked him to obtain PFT's and a DLCO to attempt to rule of functional dysfunction from amiodarone.   Carleene Overlie Tondalaya Perren,MD

## 2020-05-13 ENCOUNTER — Other Ambulatory Visit: Payer: Managed Care, Other (non HMO)

## 2020-05-17 ENCOUNTER — Ambulatory Visit (HOSPITAL_COMMUNITY)
Admission: EM | Admit: 2020-05-17 | Discharge: 2020-05-17 | Disposition: A | Discharge status: Home / Self Care | Payer: Managed Care, Other (non HMO) | Attending: Family Medicine | Admitting: Family Medicine

## 2020-05-17 ENCOUNTER — Encounter (HOSPITAL_COMMUNITY): Payer: Self-pay

## 2020-05-17 DIAGNOSIS — M25562 Pain in left knee: Secondary | ICD-10-CM | POA: Diagnosis not present

## 2020-05-17 DIAGNOSIS — K649 Unspecified hemorrhoids: Secondary | ICD-10-CM | POA: Diagnosis not present

## 2020-05-17 DIAGNOSIS — M79671 Pain in right foot: Secondary | ICD-10-CM | POA: Diagnosis not present

## 2020-05-17 MED ORDER — HYDROCORTISONE (PERIANAL) 2.5 % EX CREA
1.0000 | TOPICAL_CREAM | Freq: Two times a day (BID) | CUTANEOUS | 0 refills | Status: DC
Start: 2020-05-17 — End: 2020-05-28

## 2020-05-17 MED ORDER — PREDNISONE 10 MG PO TABS: ORAL_TABLET | ORAL | 0 refills | Status: DC

## 2020-05-17 NOTE — ED Triage Notes (Addendum)
Pt states he awoke two days ago with pain to right foot and left knee.  Also c/o "hemorrhoids" for approx 1 week. Denies bleeding from hemorrhoids. Has taken OTC cream w/o improvement.   Denies trauma or injury to area.  Right foot edematous and warm to touch. Pt has difficulty ambulating.  No OTC pain relievers taken for foot/knee pain.  Reports dark urine the past day.

## 2020-05-18 NOTE — ED Provider Notes (Signed)
[facility]    CSN: [id number] Arrival date & time: 05/17/20  1756      History   Chief Complaint Chief Complaint  Patient presents with   Foot Pain   Knee Pain    HPI [name] is a 49 y.o. male.   Started 2 days ago with right foot pain and left knee pain, swelling and some redness in both areas. Denies fever, injury to either area, inability to tolerate weight bearing. Notes both areas are a recurrent issue, typically relieved by prednisone and OTC pain relievers. Most recent flare was evaluated several months ago by PCP and he was noted to have an elevated uric acid level. Currently taking OTC pain relievers without significant relief.  Also having about a week of external hemorrhoids somewhat relieved by prep H over the counter. No bleeding, painful BMs. Does sometimes strain with BMs.      Past Medical History:  Diagnosis Date   CHF (congestive heart failure) (HCC)    chronic   DCM (dilated cardiomyopathy) (HCC)    Dyslipidemia    HTN (hypertension)    Hx of ventricular fibrillation    Hx of VFib arrest s/p AICD placement   LV dysfunction    with an EF in 2007 at 34%    Patient Active Problem List   Diagnosis Date Noted   Dermatitis 02/10/2017   Nephrolithiasis 02/10/2017   HTN (hypertension) 09/22/2015   Constipation 09/22/2015   Asthma, intermittent 02/04/2014   Ventricular fibrillation ([place]) 11/09/2013   Environmental allergies 10/11/2013   IFG (impaired fasting glucose) 10/11/2013   Hyperlipidemia 35/32/9924   Chronic systolic heart failure ([place]) 05/26/2010   Automatic implantable cardioverter-defibrillator in situ 05/26/2010    Past Surgical History:  Procedure Laterality Date   CARDIAC DEFIBRILLATOR PLACEMENT     PACEMAKER IMPLANT     repeat echo  2008    demonstrated near normalization   TRANSTHORACIC ECHOCARDIOGRAM  11/09/2005, 01/15/2009, 02/07/2010       Home Medications    Prior to Admission  medications   Medication Sig Start Date End Date Taking? Authorizing Provider  amiodarone (PACERONE) 200 MG tablet Take 1 tablet (200 mg total) by mouth daily. 11/30/19  Yes [place] Lance, MD  aspirin 81 MG tablet Take 1 tablet (81 mg total) by mouth 2 (two) times daily. 09/22/15  Yes Funches, Josalyn, MD  bisoprolol (ZEBETA) 10 MG tablet Take 1 tablet (10 mg total) by mouth daily. 11/30/19  Yes [place] Lance, MD  budesonide-formoterol [facility]) 80-4.5 MCG/ACT inhaler Inhale 2 puffs into the lungs 2 (two) times daily. Must keep upcoming office visit for refills 11/09/19  Yes Fulp, Cammie, MD  sacubitril-valsartan (ENTRESTO) 24-26 MG Take 1 tablet by mouth 2 (two) times daily. 11/30/19  Yes [place] Lance, MD  hydrocortisone (ANUSOL-HC) 2.5 % rectal cream Place 1 application rectally 2 (two) times daily. 05/17/20   Volney American, PA-C  predniSONE (DELTASONE) 10 MG tablet Take 6 tabs day one, 5 tabs day two, 4 tabs day three, etc 05/17/20   Volney American, PA-C  rosuvastatin (CRESTOR) 10 MG tablet Take 1 tablet (10 mg total) by mouth daily. To lower cholesterol 11/17/18 10/24/19  Antony Blackbird, MD    Family History Family History  Problem Relation Age of Onset   Diabetes Father    Heart Problems Father        unsure of what kind   Asthma Maternal Uncle    Diabetes Maternal Grandmother  Social History Social History   Tobacco Use   Smoking status: Former Smoker    Packs/day: 0.25    Years: 2.00    Pack years: 0.50    Types: Cigarettes    Quit date: 07/12/1998    Years since quitting: 21.8   Smokeless tobacco: Never Used  Scientific laboratory technician Use: Never used  Substance Use Topics   Alcohol use: No    Comment: Denies   Drug use: No     Allergies   Ace inhibitors   Review of Systems Review of Systems PER HPI   Physical Exam Triage Vital Signs ED Triage Vitals  Enc Vitals Group     BP 05/17/20 1829 119/73     Pulse Rate 05/17/20 1829 (!) 50      Resp 05/17/20 1829 18     Temp 05/17/20 1829 99.5 F (37.5 C)     Temp Source 05/17/20 1829 Oral     SpO2 05/17/20 1829 97 %     Weight --      Height --      Head Circumference --      Peak Flow --      Pain Score 05/17/20 1842 7     Pain Loc --      Pain Edu? --      Excl. in [place]? --    No data found.  Updated Vital Signs BP 119/73 (BP Location: Left Arm)    Pulse (!) 50    Temp 99.5 F (37.5 C) (Oral)    Resp 18    SpO2 97%   Visual Acuity Right Eye Distance:   Left Eye Distance:   Bilateral Distance:    Right Eye Near:   Left Eye Near:    Bilateral Near:     Physical Exam Vitals and nursing note reviewed.  Constitutional:      Appearance: Normal appearance.  HENT:     Head: Atraumatic.  Eyes:     Extraocular Movements: Extraocular movements intact.     Conjunctiva/sclera: Conjunctivae normal.  Cardiovascular:     Rate and Rhythm: Normal rate and regular rhythm.  Pulmonary:     Effort: Pulmonary effort is normal.     Breath sounds: Normal breath sounds.  Abdominal:     General: Bowel sounds are normal. There is no distension.     Palpations: Abdomen is soft.     Tenderness: There is no abdominal tenderness. There is no right CVA tenderness, left CVA tenderness or guarding.  Genitourinary:    Comments: GU exam declined today Musculoskeletal:        General: Swelling (both areas of tenderness, localized) and tenderness (dorsal surface right foot and anterior superior left knee) present.     Cervical back: Normal range of motion and neck supple.     Comments: Sitting in wheelchair, able to move both areas  Skin:    General: Skin is warm and dry.     Findings: Erythema (dorsal surface of right foot) present. No bruising or rash.  Neurological:     General: No focal deficit present.     Mental Status: He is oriented to person, place, and time.     Motor: No weakness.  Psychiatric:        Mood and Affect: Mood normal.        Thought Content: Thought content  normal.        Judgment: Judgment normal.      UC Treatments / Results  Labs (all labs ordered are listed, but only abnormal results are displayed) Labs Reviewed - No data to display  EKG   Radiology No results found.  Procedures Procedures (including critical care time)  Medications Ordered in UC Medications - No data to display  Initial Impression / Assessment and Plan / UC Course  I have reviewed the triage vital signs and the nursing notes.  Pertinent labs & imaging results that were available during my care of the patient were reviewed by me and considered in my medical decision making (see chart for details).     Suspect inflammatory arthritis, unclear if gout vs autoimmune possible. Discussed prednisone taper, OTC pain relievers, supportive home care, and close PCP f/u for further workup and management if recurring. Discussed antiinflammatory diet.  Anusol cream given for hemorrhoids with diet management and sitz baths. Return precautions reviewed.   Final Clinical Impressions(s) / UC Diagnoses   Final diagnoses:  Right foot pain  Acute pain of left knee  Hemorrhoids, unspecified hemorrhoid type   Discharge Instructions   None    ED Prescriptions    Medication Sig Dispense Auth. Provider   predniSONE (DELTASONE) 10 MG tablet Take 6 tabs day one, 5 tabs day two, 4 tabs day three, etc 21 tablet Volney American, PA-C   hydrocortisone (ANUSOL-HC) 2.5 % rectal cream Place 1 application rectally 2 (two) times daily. 30 g Volney American, [place]     PDMP not reviewed this encounter.   Volney American, [place] 05/18/20 1023

## 2020-05-19 MED FILL — AMIODARONE HCL 200 MG TAB: 200 | 30 days supply | Qty: 30 | Fill #3

## 2020-05-24 ENCOUNTER — Inpatient Hospital Stay (HOSPITAL_COMMUNITY)
Admission: EM | Admit: 2020-05-24 | Discharge: 2020-05-28 | DRG: 444 | Disposition: A | Discharge status: Home / Self Care | Payer: Managed Care, Other (non HMO) | Attending: Internal Medicine | Admitting: Internal Medicine

## 2020-05-24 ENCOUNTER — Other Ambulatory Visit: Payer: Self-pay

## 2020-05-24 ENCOUNTER — Encounter (HOSPITAL_COMMUNITY): Payer: Self-pay | Admitting: Emergency Medicine

## 2020-05-24 ENCOUNTER — Emergency Department (HOSPITAL_COMMUNITY): Payer: Managed Care, Other (non HMO)

## 2020-05-24 DIAGNOSIS — R509 Fever, unspecified: Secondary | ICD-10-CM

## 2020-05-24 DIAGNOSIS — Z8674 Personal history of sudden cardiac arrest: Secondary | ICD-10-CM

## 2020-05-24 DIAGNOSIS — I472 Ventricular tachycardia: Secondary | ICD-10-CM | POA: Diagnosis present

## 2020-05-24 DIAGNOSIS — R1084 Generalized abdominal pain: Secondary | ICD-10-CM

## 2020-05-24 DIAGNOSIS — J45909 Unspecified asthma, uncomplicated: Secondary | ICD-10-CM | POA: Diagnosis present

## 2020-05-24 DIAGNOSIS — M109 Gout, unspecified: Secondary | ICD-10-CM | POA: Diagnosis present

## 2020-05-24 DIAGNOSIS — I959 Hypotension, unspecified: Secondary | ICD-10-CM | POA: Diagnosis present

## 2020-05-24 DIAGNOSIS — Z833 Family history of diabetes mellitus: Secondary | ICD-10-CM

## 2020-05-24 DIAGNOSIS — Z825 Family history of asthma and other chronic lower respiratory diseases: Secondary | ICD-10-CM

## 2020-05-24 DIAGNOSIS — Z20822 Contact with and (suspected) exposure to covid-19: Secondary | ICD-10-CM | POA: Diagnosis present

## 2020-05-24 DIAGNOSIS — Z87891 Personal history of nicotine dependence: Secondary | ICD-10-CM

## 2020-05-24 DIAGNOSIS — K8070 Calculus of gallbladder and bile duct without cholecystitis without obstruction: Principal | ICD-10-CM | POA: Diagnosis present

## 2020-05-24 DIAGNOSIS — Z23 Encounter for immunization: Secondary | ICD-10-CM

## 2020-05-24 DIAGNOSIS — N182 Chronic kidney disease, stage 2 (mild): Secondary | ICD-10-CM | POA: Diagnosis present

## 2020-05-24 DIAGNOSIS — B179 Acute viral hepatitis, unspecified: Secondary | ICD-10-CM | POA: Diagnosis present

## 2020-05-24 DIAGNOSIS — Z95810 Presence of automatic (implantable) cardiac defibrillator: Secondary | ICD-10-CM

## 2020-05-24 DIAGNOSIS — I5022 Chronic systolic (congestive) heart failure: Secondary | ICD-10-CM | POA: Diagnosis present

## 2020-05-24 DIAGNOSIS — R1900 Intra-abdominal and pelvic swelling, mass and lump, unspecified site: Secondary | ICD-10-CM | POA: Diagnosis present

## 2020-05-24 DIAGNOSIS — K8020 Calculus of gallbladder without cholecystitis without obstruction: Secondary | ICD-10-CM

## 2020-05-24 DIAGNOSIS — I130 Hypertensive heart and chronic kidney disease with heart failure and stage 1 through stage 4 chronic kidney disease, or unspecified chronic kidney disease: Secondary | ICD-10-CM | POA: Diagnosis present

## 2020-05-24 DIAGNOSIS — I420 Dilated cardiomyopathy: Secondary | ICD-10-CM | POA: Diagnosis present

## 2020-05-24 DIAGNOSIS — Z888 Allergy status to other drugs, medicaments and biological substances status: Secondary | ICD-10-CM

## 2020-05-24 DIAGNOSIS — I4901 Ventricular fibrillation: Secondary | ICD-10-CM | POA: Diagnosis present

## 2020-05-24 DIAGNOSIS — Z7982 Long term (current) use of aspirin: Secondary | ICD-10-CM

## 2020-05-24 DIAGNOSIS — Z79899 Other long term (current) drug therapy: Secondary | ICD-10-CM

## 2020-05-24 DIAGNOSIS — Z7951 Long term (current) use of inhaled steroids: Secondary | ICD-10-CM

## 2020-05-24 DIAGNOSIS — K869 Disease of pancreas, unspecified: Secondary | ICD-10-CM | POA: Diagnosis present

## 2020-05-24 DIAGNOSIS — E785 Hyperlipidemia, unspecified: Secondary | ICD-10-CM | POA: Diagnosis present

## 2020-05-24 HISTORY — DX: Unspecified systolic (congestive) heart failure: I50.20

## 2020-05-24 HISTORY — DX: Other cardiomyopathies: I42.8

## 2020-05-24 LAB — COMPREHENSIVE METABOLIC PANEL
ALT: 216 U/L — ABNORMAL HIGH (ref 0–44)
AST: 223 U/L — ABNORMAL HIGH (ref 15–41)
Albumin: 3.2 g/dL — ABNORMAL LOW (ref 3.5–5.0)
Alkaline Phosphatase: 73 U/L (ref 38–126)
Anion gap: 10 (ref 5–15)
BUN: 20 mg/dL (ref 6–20)
CO2: 26 mmol/L (ref 22–32)
Calcium: 8.9 mg/dL (ref 8.9–10.3)
Chloride: 103 mmol/L (ref 98–111)
Creatinine, Ser: 1.44 mg/dL — ABNORMAL HIGH (ref 0.61–1.24)
GFR, Estimated: 60 mL/min — ABNORMAL LOW (ref >60)
Glucose, Bld: 121 mg/dL — ABNORMAL HIGH (ref 70–99)
Potassium: 3.6 mmol/L (ref 3.5–5.1)
Sodium: 139 mmol/L (ref 135–145)
Total Bilirubin: 3.2 mg/dL — ABNORMAL HIGH (ref 0.3–1.2)
Total Protein: 6.2 g/dL — ABNORMAL LOW (ref 6.5–8.1)

## 2020-05-24 LAB — CBC
HCT: 52.4 % — ABNORMAL HIGH (ref 39.0–52.0)
Hemoglobin: 17.3 g/dL — ABNORMAL HIGH (ref 13.0–17.0)
MCH: 28 pg (ref 26.0–34.0)
MCHC: 33 g/dL (ref 30.0–36.0)
MCV: 84.9 fL (ref 80.0–100.0)
Platelets: 272 10*3/uL (ref 150–400)
RBC: 6.17 MIL/uL — ABNORMAL HIGH (ref 4.22–5.81)
RDW: 13.4 % (ref 11.5–15.5)
WBC: 6.8 10*3/uL (ref 4.0–10.5)
nRBC: 0 % (ref 0.0–0.2)

## 2020-05-24 LAB — LIPASE, BLOOD: Lipase: 31 U/L (ref 11–51)

## 2020-05-24 LAB — URINALYSIS, ROUTINE W REFLEX MICROSCOPIC
Glucose, UA: NEGATIVE mg/dL
Hgb urine dipstick: NEGATIVE
Ketones, ur: NEGATIVE mg/dL
Leukocytes,Ua: NEGATIVE
Nitrite: NEGATIVE
Protein, ur: NEGATIVE mg/dL
Specific Gravity, Urine: 1.014 (ref 1.005–1.030)
pH: 5 (ref 5.0–8.0)

## 2020-05-24 LAB — TYPE AND SCREEN
ABO/RH(D): O NEG
Antibody Screen: NEGATIVE

## 2020-05-24 LAB — LACTIC ACID, PLASMA: Lactic Acid, Venous: 2.5 mmol/L (ref 0.5–1.9)

## 2020-05-24 MED ORDER — FENTANYL CITRATE (PF) 100 MCG/2ML IJ SOLN
50.0000 ug | Freq: Once | INTRAMUSCULAR | Status: AC
  Administered 2020-05-24: 50 ug via INTRAVENOUS
  Filled 2020-05-24: qty 2

## 2020-05-24 MED ORDER — IOHEXOL 300 MG/ML  SOLN
100.0000 mL | Freq: Once | INTRAMUSCULAR | Status: AC | PRN
  Administered 2020-05-24: 100 mL via INTRAVENOUS

## 2020-05-24 MED ORDER — PIPERACILLIN-TAZOBACTAM 3.375 G IVPB
3.3750 g | Freq: Once | INTRAVENOUS | Status: AC
  Administered 2020-05-24: 3.375 g via INTRAVENOUS
  Filled 2020-05-24: qty 50

## 2020-05-24 MED ORDER — SODIUM CHLORIDE 0.9 % IV BOLUS
500.0000 mL | Freq: Once | INTRAVENOUS | Status: AC
  Administered 2020-05-24: 500 mL via INTRAVENOUS

## 2020-05-24 NOTE — ED Notes (Signed)
Date and time results received: 05/24/20 2122 (use smartphrase ".now" to insert current time)  Test: Lactic acid Critical Value: 2.5  Name of Provider Notified: MD Bero  Orders Received? Or Actions Taken?: Awaiting orders

## 2020-05-24 NOTE — H&P (Signed)
Date: 05/24/2020               Patient Name:  [name] MRN: [id number]  DOB: 06-Dec-1970 Age / Sex: 49 y.o., male   PCP: Antony Blackbird, MD         Medical Service: Internal Medicine Teaching Service         Attending Physician: Dr. Dareen Piano    First Contact: Dr. Wynetta Emery Pager: [phone]  Second Contact: Dr. Darrick Meigs Pager: [phone]       After Hours (After 5p/  First Contact Pager: [phone]  weekends / holidays): Second Contact Pager: [phone]   Chief Complaint: abdominal pain  History of Present Illness:   [name] is a 49 y.o. [place] male with hx of HFrEF secondary to non-ischemic dilated cardiomyopathy, recurrent V fib and V tach s/p ICD placement, HTN, HLD, CKD 2, prior unknown abdominal surgery presenting for generalized abdominal pain with onset around 5pm today. He states that there is no particular area which is especially painful. It is improved since pain medication was started in the ED. He has associated nausea but no vomiting, dizziness but no LOC, and reports chills. Over the past few days, he has been able to eat and drink without difficulty, last meal was lunch on 11/13. Last BM was earlier today, denies recent constipation but does note hemorrhoids. Denies recent weight loss or night sweats. Was seen by his PCP on 04/25/20 for early satiety and abdominal bloating with associated constipation. Referral to GI was placed but was not yet seen. He returns to [place] to visit every year.  ED Course: CT with 4.7cm mass between the pancreas and duodenum. The mass is mildly larger than on prior CT renal study from 2019, which radiologists note is slower than would be expected for pancreatic adenocarcinoma. 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 Elevated AST, ALT, and  bilirubin. Negative lipase.  Mildly hypotensive to 82/37 upon admission and received IVF boluses with mild imrpvement.  Mentating normally. Temperature of 100.2. LA of 2.5. Started Zosyn. Pain controlled with fentanyl.  Current Outpatient Medications  Medication Instructions  . amiodarone (PACERONE) 200 mg, Oral, Daily  . aspirin 81 mg, Oral, 2 times daily  . bisoprolol (ZEBETA) 10 mg, Oral, Daily  . budesonide-formoterol (SYMBICORT) 80-4.5 MCG/ACT inhaler 2 puffs, Inhalation, 2 times daily, Must keep upcoming office visit for refills  . hydrocortisone (ANUSOL-HC) 2.5 % rectal cream 1 application, Rectal, 2 times daily  . predniSONE (DELTASONE) 10 MG tablet Take 6 tabs day one, 5 tabs day two, 4 tabs day three, etc  . sacubitril-valsartan (ENTRESTO) 24-26 MG 1 tablet, Oral, 2 times daily    Allergies as of 05/24/2020 - Review Complete 05/24/2020  Allergen Reaction Noted  . Ace inhibitors Cough 08/18/2015    Past Medical History:  Diagnosis Date  . CHF (congestive heart failure) (HCC)    chronic  . DCM (dilated cardiomyopathy) ([place])   . Dyslipidemia   . HTN (hypertension)   . Hx of ventricular fibrillation    Hx of VFib arrest s/p AICD placement  . LV dysfunction    with an EF in 2007 at 34%    Family History  Problem Relation Age of Onset  . Diabetes Father   . Heart Problems Father        unsure of what kind  . Asthma  Maternal Uncle   . Diabetes Maternal Grandmother     Social History   Tobacco Use  . Smoking status: Former Smoker    Packs/day: 0.25    Years: 2.00    Pack years: 0.50    Types: Cigarettes    Quit date: 07/12/1998    Years since quitting: 21.8  . Smokeless tobacco: Never Used  Vaping Use  . Vaping Use: Never used  Substance Use Topics  . Alcohol use: No    Comment: Denies  . Drug use: No    Review of Systems: Review of Systems  Constitutional: Positive for chills and fever.  HENT: Negative for congestion and sore throat.   Eyes: Negative  for blurred vision and double vision.  Respiratory: Negative for cough and shortness of breath.   Cardiovascular: Negative for chest pain, palpitations and leg swelling.  Gastrointestinal: Positive for abdominal pain and nausea. Negative for constipation, diarrhea and vomiting.  Genitourinary: Negative for dysuria and frequency.  Neurological: Positive for dizziness. Negative for loss of consciousness.  All other systems reviewed and are negative.  Physical Exam: Blood pressure (!) 92/50, pulse (!) 56, temperature 100.2 F (37.9 C), temperature source Oral, resp. rate (!) 22, height 6\' 2"  (1.88 m), weight 115 kg, SpO2 96 %. Physical Exam Vitals and nursing note reviewed.  Constitutional:      General: He is not in acute distress.    Appearance: He is well-developed.  HENT:     Head: Normocephalic and atraumatic.     Mouth/Throat:     Mouth: Mucous membranes are moist.  Eyes:     Extraocular Movements: Extraocular movements intact.  Cardiovascular:     Rate and Rhythm: Regular rhythm. Bradycardia present.     Heart sounds: Normal heart sounds. No murmur heard.  No friction rub. No gallop.   Pulmonary:     Effort: Pulmonary effort is normal. No respiratory distress.     Breath sounds: Normal breath sounds. No wheezing, rhonchi or rales.  Abdominal:     General: Abdomen is flat. Bowel sounds are normal. There is no distension.     Palpations: Abdomen is soft.     Tenderness: There is abdominal tenderness (mild LUQ tenderness, but had more tenderness prior to pain medication).  Skin:    General: Skin is warm and dry.  Neurological:     General: No focal deficit present.     Mental Status: He is alert.  Psychiatric:        Mood and Affect: Mood normal.        Behavior: Behavior normal.      EKG: personally reviewed my interpretation is sinus bradytachycardia   CT ABDOMEN PELVIS W CONTRAST Result Date: 05/24/2020  IMPRESSION:  1. 4.7 cm solid mass interposed between the  uncinate process of the pancreas and duodenal sweep. 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 Dedicated pancreatic MRI is recommended for further evaluation.  2. Cholelithiasis without cholecystitis.  3. Intra and extrahepatic biliary duct dilation without evidence of choledocholithiasis. This could be evaluated at the time of follow-up MRI as well.   Assessment & Plan by Problem: Active Problems:   Abdominal mass   [name] is a 49 y.o. year old male with hx of HFrEF secondary to non-ischemic dilated cardiomyopathy, recurrent V fib and V tach s/p ICD placement, HTN, HLD, CKD 2,  presenting for acute onset abdominal pain, found on CT scan to have a 4.7cm mass between the pancreas and duodenum with unclear origin. Also transaminitis with biliary duct dilation and low grade fever, choleliths but no evidence of choledocholithiasis.   #Solid slow-growing abdominal mass between pancreas and duodenum #Transaminitis with intra- and extra-hepatic biliary duct dilation #Low grade fever Patient presents with acute onset generalized abdominal pain with onset this afternoon, associated with nausea. Of note has had early satiety and abdominal bloating for which he was seen by his PCP 1 month ago, referral to GI placed but was not yet seen. CT scan showed a 4.7cm mass between the pancreas and duodenum. The mass is mildly larger than on prior CT renal study from 2019, which radiology notes is slower than would be expected for pancreatic adenocarcinoma. 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 Elevated AST 223, ALT 216, and bilirubin 3.2.  Temperature of 100.2 on admission, and reports chills at home. Weight stable since 2014.  -NPO except for sips -f/u MRI w and w/o contrast -f/u MRCP -GI consulted, appreciate recs. Believe it is unlikely cholangitis. Will see patient in AM. -daily CMP -f/u PT/INR  -f/u viral hepatitis panel -f/u blood cultures  #Hypotension Received 1L normal saline in ED. BP 82/37 on admission -> 97/50. Mildly elevated LA of 2.5. Temperature of 100.2 on admission and reported chills at home. Note hx of HFrEF 2/2 nonischemic cardiomyopathy, not fluid overloaded on exam. -LR bolus -monitor carefully for signs of volume overload given hx of HFrEF -f/u repeat LA -f/u blood cultures  #Chronic HFrEF 2/2 non-ischemic dilated cardiomyopathy #Recurrent V fib and V tach s/p ICD placement #Chronic bradycardia Last echo in 2020 with LVEF 16-94%, grade 2 diastolic dysfunction, moderate LV dilation.  Had recent follow up with cardiology. VF and VT have been well controlled with amiodarone 200mg . Note that dose was recently reduced from 400mg . -continue home ASA 81mg  and amiodarone 200mg  -holding home Entresto 24-26mg  BID and bisoprolol 10mg  daily for hypotension -telemetry  #CKD II Creatinine of 1.44 on admission, baseline seem to be around 1.33. -daily CMP -avoid nephrotoxins  #Essential HTN Hypotensive on arrival, improving with IV fluids. -hold home bisoprolol and Entresto as above  #Asthma On symbicort at home. -resume home symbicort  Dispo: Admit patient to Inpatient with expected length of stay greater than 2 midnights.  Signed: Andrew Au, MD 05/24/2020, 11:13 PM  Pager: [phone]  After 5pm on weekdays and 1pm on weekends: On Call pager: [phone]

## 2020-05-24 NOTE — ED Triage Notes (Addendum)
Patient reports pain across his abdomen today with nausea , patient added painful hemorrhoids and fell on his couch this evening , alert and oriented /respirations unlabored. No fever or chills . Hypotensive at triage.

## 2020-05-24 NOTE — ED Provider Notes (Signed)
[facility] EMERGENCY DEPARTMENT Provider Note   CSN: [id number] Arrival date & time: 05/24/20  1937     History Chief Complaint  Patient presents with  . Abdominal Pain    Hypotensive    [name] is a 49 y.o. male.  Patient who is arabic-speaking, from [place] with h/o non-ischemic CM, VF, s/p ICD insertion (Dr. Lovena Le), initially for primary prevention who has had recurent VT and VF and been placed on amiodarone, history of kidney stones, history of unknown abdominal surgery --presents the emergency department today for evaluation of abdominal pain which started acutely at around 5 PM.  Pain is generalized.  Patient has had associated nausea.  He denies fever, chest pain, shortness of breath, diarrhea, constipation.  No difficulties with urination.  No treatments prior to arrival.  Patient does report having hemorrhoids.  No treatments since pain began.  He has not had pain like this in the past.        Past Medical History:  Diagnosis Date  . CHF (congestive heart failure) (HCC)    chronic  . DCM (dilated cardiomyopathy) ([place])   . Dyslipidemia   . HTN (hypertension)   . Hx of ventricular fibrillation    Hx of VFib arrest s/p AICD placement  . LV dysfunction    with an EF in 2007 at 34%    Patient Active Problem List   Diagnosis Date Noted  . Dermatitis 02/10/2017  . Nephrolithiasis 02/10/2017  . HTN (hypertension) 09/22/2015  . Constipation 09/22/2015  . Asthma, intermittent 02/04/2014  . Ventricular fibrillation ([place]) 11/09/2013  . Environmental allergies 10/11/2013  . IFG (impaired fasting glucose) 10/11/2013  . Hyperlipidemia 10/11/2013  . Chronic systolic heart failure ([place]) 05/26/2010  . Automatic implantable cardioverter-defibrillator in situ 05/26/2010    Past Surgical History:  Procedure Laterality Date  . CARDIAC DEFIBRILLATOR PLACEMENT    . PACEMAKER IMPLANT    . repeat echo  2008    demonstrated near normalization  .  TRANSTHORACIC ECHOCARDIOGRAM  11/09/2005, 01/15/2009, 02/07/2010       Family History  Problem Relation Age of Onset  . Diabetes Father   . Heart Problems Father        unsure of what kind  . Asthma Maternal Uncle   . Diabetes Maternal Grandmother     Social History   Tobacco Use  . Smoking status: Former Smoker    Packs/day: 0.25    Years: 2.00    Pack years: 0.50    Types: Cigarettes    Quit date: 07/12/1998    Years since quitting: 21.8  . Smokeless tobacco: Never Used  Vaping Use  . Vaping Use: Never used  Substance Use Topics  . Alcohol use: No    Comment: Denies  . Drug use: No    Home Medications Prior to Admission medications   Medication Sig Start Date End Date Taking? Authorizing Provider  amiodarone (PACERONE) 200 MG tablet Take 1 tablet (200 mg total) by mouth daily. 11/30/19   Evans Lance, MD  aspirin 81 MG tablet Take 1 tablet (81 mg total) by mouth 2 (two) times daily. 09/22/15   Funches, Adriana Mccallum, MD  bisoprolol (ZEBETA) 10 MG tablet Take 1 tablet (10 mg total) by mouth daily. 11/30/19   Evans Lance, MD  budesonide-formoterol [facility]) 80-4.5 MCG/ACT inhaler Inhale 2 puffs into the lungs 2 (two) times daily. Must keep upcoming office visit for refills 11/09/19   Fulp, Cammie, MD  hydrocortisone (ANUSOL-HC) 2.5 % rectal  cream Place 1 application rectally 2 (two) times daily. 05/17/20   Volney American, PA-C  predniSONE (DELTASONE) 10 MG tablet Take 6 tabs day one, 5 tabs day two, 4 tabs day three, etc 05/17/20   Volney American, PA-C  sacubitril-valsartan (ENTRESTO) 24-26 MG Take 1 tablet by mouth 2 (two) times daily. 11/30/19   Evans Lance, MD  rosuvastatin (CRESTOR) 10 MG tablet Take 1 tablet (10 mg total) by mouth daily. To lower cholesterol 11/17/18 10/24/19  Fulp, Cammie, MD    Allergies    Ace inhibitors  Review of Systems   Review of Systems  Constitutional: Negative for fever.  HENT: Negative for rhinorrhea and sore throat.    Eyes: Negative for redness.  Respiratory: Negative for cough.   Cardiovascular: Negative for chest pain.  Gastrointestinal: Positive for abdominal pain, nausea and rectal pain. Negative for diarrhea and vomiting.  Genitourinary: Negative for dysuria and hematuria.  Musculoskeletal: Negative for myalgias.  Skin: Negative for rash.  Neurological: Negative for headaches.    Physical Exam Updated Vital Signs BP (!) 92/50   Pulse (!) 56   Temp 100.2 F (37.9 C) (Oral)   Resp (!) 22   Ht 6\' 2"  (1.88 m)   Wt 115 kg   SpO2 96%   BMI 32.55 kg/m   Physical Exam Vitals and nursing note reviewed.  Constitutional:      General: He is in acute distress.     Appearance: He is well-developed.     Comments: Patient appears uncomfortable.  HENT:     Head: Normocephalic and atraumatic.  Eyes:     General:        Right eye: No discharge.        Left eye: No discharge.     Conjunctiva/sclera: Conjunctivae normal.  Cardiovascular:     Rate and Rhythm: Normal rate and regular rhythm.     Heart sounds: Normal heart sounds.     Comments: Pacemaker pocket left upper chest. Pulmonary:     Effort: Pulmonary effort is normal.     Breath sounds: Normal breath sounds.  Abdominal:     Palpations: Abdomen is soft.     Tenderness: There is generalized abdominal tenderness (Moderate to severe abdominal tenderness, generalized). There is no guarding or rebound.  Musculoskeletal:     Cervical back: Normal range of motion and neck supple.  Skin:    General: Skin is warm and dry.  Neurological:     Mental Status: He is alert.     ED Results / Procedures / Treatments   Labs (all labs ordered are listed, but only abnormal results are displayed) Labs Reviewed  COMPREHENSIVE METABOLIC PANEL - Abnormal; Notable for the following components:      Result Value   Glucose, Bld 121 (*)    Creatinine, Ser 1.44 (*)    Total Protein 6.2 (*)    Albumin 3.2 (*)    AST 223 (*)    ALT 216 (*)    Total  Bilirubin 3.2 (*)    GFR, Estimated 60 (*)    All other components within normal limits  CBC - Abnormal; Notable for the following components:   RBC 6.17 (*)    Hemoglobin 17.3 (*)    HCT 52.4 (*)    All other components within normal limits  URINALYSIS, ROUTINE W REFLEX MICROSCOPIC - Abnormal; Notable for the following components:   Color, Urine AMBER (*)    Bilirubin Urine SMALL (*)  All other components within normal limits  LACTIC ACID, PLASMA - Abnormal; Notable for the following components:   Lactic Acid, Venous 2.5 (*)    All other components within normal limits  CULTURE, BLOOD (ROUTINE X 2)  CULTURE, BLOOD (ROUTINE X 2)  RESPIRATORY PANEL BY RT PCR (FLU A&B, COVID)  LIPASE, BLOOD  LACTIC ACID, PLASMA  TYPE AND SCREEN  ABO/RH    EKG EKG Interpretation  Date/Time:  Saturday May 24 2020 20:02:21 EST Ventricular Rate:  48 PR Interval:    QRS Duration: 133 QT Interval:  540 QTC Calculation: 483 R Axis:   35 Text Interpretation: Sinus bradycardia Probable left atrial enlargement Nonspecific intraventricular conduction delay Minimal ST depression, lateral leads Minimal ST elevation, anterior leads Confirmed by Gerlene Fee [phone]) on 05/24/2020 8:12:48 PM   Radiology CT ABDOMEN PELVIS W CONTRAST  Result Date: 05/24/2020 CLINICAL DATA:  Abdominal pain, fever, nausea EXAM: CT ABDOMEN AND PELVIS WITH CONTRAST TECHNIQUE: Multidetector CT imaging of the abdomen and pelvis was performed using the standard protocol following bolus administration of intravenous contrast. CONTRAST:  171mL OMNIPAQUE IOHEXOL 300 MG/ML  SOLN COMPARISON:  01/21/2018 FINDINGS: Lower chest: No acute pleural or parenchymal lung disease. Hepatobiliary: Calcified gallstones are identified. No gallbladder wall thickening or pericholecystic fluid. No evidence of choledocholithiasis. There is mild intrahepatic and extrahepatic biliary dilation, with common bile duct measuring 7 mm. No focal liver  parenchymal abnormalities. Pancreas: There is a 4.7 x 4.4 x 4.5 cm solid mass between the uncinate process of the pancreas and the duodenal sweep. Mass demonstrates heterogeneous attenuation, likely representing enhancement. In comparison to 2019, this mass has demonstrated little interval growth measuring up to 4.6 x 4.2 cm previously. It is unclear whether the mass arises from the pancreas or the duodenal wall, and dedicated pancreatic MRI is recommended for further evaluation. The remainder of the pancreas is normal. Spleen: Normal in size without focal abnormality. Adrenals/Urinary Tract: Chronic right renal atrophy. Otherwise the kidneys enhance normally. The adrenals are unremarkable. Bladder is grossly normal. Stomach/Bowel: No bowel obstruction or ileus. Normal appendix right lower quadrant. No bowel wall thickening or inflammatory change. Vascular/Lymphatic: No significant vascular findings are present. No enlarged abdominal or pelvic lymph nodes. Reproductive: Prostate is unremarkable. Other: No free fluid or free gas.  No abdominal wall hernia. Musculoskeletal: No acute or destructive bony lesions. Reconstructed images demonstrate no additional findings. IMPRESSION: 1. 4.7 cm solid mass interposed between the uncinate process of the pancreas and duodenal sweep. 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 Dedicated pancreatic MRI is recommended for further evaluation. 2. Cholelithiasis without cholecystitis. 3. Intra and extrahepatic biliary duct dilation without evidence of choledocholithiasis. This could be evaluated at the time of follow-up MRI as well. Electronically Signed   By: Randa Ngo M.D.   On: 05/24/2020 22:51    Procedures Procedures (including critical care time)  Medications Ordered in ED Medications   sodium chloride 0.9 % bolus 500 mL (0 mLs Intravenous Stopped 05/24/20 2110)  fentaNYL (SUBLIMAZE) injection 50 mcg (50 mcg Intravenous Given 05/24/20 2027)  piperacillin-tazobactam (ZOSYN) IVPB 3.375 g (0 g Intravenous Stopped 05/24/20 2138)  fentaNYL (SUBLIMAZE) injection 50 mcg (50 mcg Intravenous Given 05/24/20 2144)  sodium chloride 0.9 % bolus 500 mL (0 mLs Intravenous Stopped 05/24/20 2237)  iohexol (OMNIPAQUE) 300 MG/ML solution 100 mL (100 mLs Intravenous Contrast Given 05/24/20  2226)    ED Course  I have reviewed the triage vital signs and the nursing notes.  Pertinent labs & imaging results that were available during my care of the patient were reviewed by me and considered in my medical decision making (see chart for details).  Patient seen and examined. Work-up initiated. Medications ordered.  EKG reviewed.   Vital signs reviewed and are as follows: BP (!) 101/56   Pulse (!) 50   Temp 100.2 F (37.9 C) (Oral)   Resp 20   Ht 6\' 2"  (1.88 m)   Wt 115 kg   SpO2 100%   BMI 32.55 kg/m   8:29 PM Evaluated RUQ, LUQ, aorta without obvious abnormality. Given pain and elevated temp, dose of Zosyn ordered. Seen at bedside with Dr. Sedonia Small.   11:12 PM CT shows gallstones, dilated bile ducts, solid mass.  Patient will need admission for further evaluation given elevated liver function tests, hypertension, and low-grade fever.  Admit to medicine.  Will discuss further imaging with inpatient team.  Patient updated on results.  Pain is better controlled at this time.  Blood pressures remain soft in the 90s over 50s.  Repeat lactate and blood cultures ordered.   11:23 PM IMTS will see and admit patient.     MDM Rules/Calculators/A&P                          Admit.   Final Clinical Impression(s) / ED Diagnoses Final diagnoses:  Generalized abdominal pain  Hypotension, unspecified hypotension type  Fever, unspecified fever cause    Rx / DC Orders ED Discharge Orders     None       Carlisle Cater, PA-C 05/24/20 2324    Maudie Flakes, MD 05/25/20 2024

## 2020-05-24 NOTE — ED Notes (Signed)
Per provider, zosyn infusion to be given over 30 minutes instead of 240 min infusion. Zosyn complete at this time.

## 2020-05-25 ENCOUNTER — Encounter (HOSPITAL_COMMUNITY): Payer: Self-pay | Admitting: Internal Medicine

## 2020-05-25 ENCOUNTER — Inpatient Hospital Stay (HOSPITAL_COMMUNITY): Payer: Managed Care, Other (non HMO)

## 2020-05-25 DIAGNOSIS — Z7982 Long term (current) use of aspirin: Secondary | ICD-10-CM | POA: Diagnosis not present

## 2020-05-25 DIAGNOSIS — B179 Acute viral hepatitis, unspecified: Secondary | ICD-10-CM | POA: Diagnosis present

## 2020-05-25 DIAGNOSIS — Z8674 Personal history of sudden cardiac arrest: Secondary | ICD-10-CM | POA: Diagnosis not present

## 2020-05-25 DIAGNOSIS — Z23 Encounter for immunization: Secondary | ICD-10-CM | POA: Diagnosis not present

## 2020-05-25 DIAGNOSIS — Z01810 Encounter for preprocedural cardiovascular examination: Secondary | ICD-10-CM | POA: Diagnosis not present

## 2020-05-25 DIAGNOSIS — R1906 Epigastric swelling, mass or lump: Secondary | ICD-10-CM | POA: Diagnosis not present

## 2020-05-25 DIAGNOSIS — I5022 Chronic systolic (congestive) heart failure: Secondary | ICD-10-CM | POA: Diagnosis present

## 2020-05-25 DIAGNOSIS — Z95810 Presence of automatic (implantable) cardiac defibrillator: Secondary | ICD-10-CM | POA: Diagnosis not present

## 2020-05-25 DIAGNOSIS — I4901 Ventricular fibrillation: Secondary | ICD-10-CM | POA: Diagnosis present

## 2020-05-25 DIAGNOSIS — J449 Chronic obstructive pulmonary disease, unspecified: Secondary | ICD-10-CM

## 2020-05-25 DIAGNOSIS — J45909 Unspecified asthma, uncomplicated: Secondary | ICD-10-CM | POA: Diagnosis present

## 2020-05-25 DIAGNOSIS — Z833 Family history of diabetes mellitus: Secondary | ICD-10-CM | POA: Diagnosis not present

## 2020-05-25 DIAGNOSIS — Z7951 Long term (current) use of inhaled steroids: Secondary | ICD-10-CM | POA: Diagnosis not present

## 2020-05-25 DIAGNOSIS — I428 Other cardiomyopathies: Secondary | ICD-10-CM | POA: Diagnosis not present

## 2020-05-25 DIAGNOSIS — Z20822 Contact with and (suspected) exposure to covid-19: Secondary | ICD-10-CM | POA: Diagnosis present

## 2020-05-25 DIAGNOSIS — R1084 Generalized abdominal pain: Secondary | ICD-10-CM | POA: Diagnosis present

## 2020-05-25 DIAGNOSIS — Z888 Allergy status to other drugs, medicaments and biological substances status: Secondary | ICD-10-CM | POA: Diagnosis not present

## 2020-05-25 DIAGNOSIS — R1900 Intra-abdominal and pelvic swelling, mass and lump, unspecified site: Secondary | ICD-10-CM | POA: Diagnosis not present

## 2020-05-25 DIAGNOSIS — Z825 Family history of asthma and other chronic lower respiratory diseases: Secondary | ICD-10-CM | POA: Diagnosis not present

## 2020-05-25 DIAGNOSIS — I10 Essential (primary) hypertension: Secondary | ICD-10-CM | POA: Diagnosis not present

## 2020-05-25 DIAGNOSIS — I2510 Atherosclerotic heart disease of native coronary artery without angina pectoris: Secondary | ICD-10-CM | POA: Diagnosis not present

## 2020-05-25 DIAGNOSIS — I2583 Coronary atherosclerosis due to lipid rich plaque: Secondary | ICD-10-CM

## 2020-05-25 DIAGNOSIS — N182 Chronic kidney disease, stage 2 (mild): Secondary | ICD-10-CM

## 2020-05-25 DIAGNOSIS — I420 Dilated cardiomyopathy: Secondary | ICD-10-CM | POA: Diagnosis present

## 2020-05-25 DIAGNOSIS — I472 Ventricular tachycardia: Secondary | ICD-10-CM | POA: Diagnosis present

## 2020-05-25 DIAGNOSIS — I959 Hypotension, unspecified: Secondary | ICD-10-CM | POA: Diagnosis present

## 2020-05-25 DIAGNOSIS — M109 Gout, unspecified: Secondary | ICD-10-CM | POA: Diagnosis present

## 2020-05-25 DIAGNOSIS — I130 Hypertensive heart and chronic kidney disease with heart failure and stage 1 through stage 4 chronic kidney disease, or unspecified chronic kidney disease: Secondary | ICD-10-CM | POA: Diagnosis present

## 2020-05-25 DIAGNOSIS — Z79899 Other long term (current) drug therapy: Secondary | ICD-10-CM | POA: Diagnosis not present

## 2020-05-25 DIAGNOSIS — K869 Disease of pancreas, unspecified: Secondary | ICD-10-CM | POA: Diagnosis present

## 2020-05-25 DIAGNOSIS — K8070 Calculus of gallbladder and bile duct without cholecystitis without obstruction: Secondary | ICD-10-CM | POA: Diagnosis present

## 2020-05-25 DIAGNOSIS — Z87891 Personal history of nicotine dependence: Secondary | ICD-10-CM | POA: Diagnosis not present

## 2020-05-25 DIAGNOSIS — E785 Hyperlipidemia, unspecified: Secondary | ICD-10-CM | POA: Diagnosis present

## 2020-05-25 LAB — COMPREHENSIVE METABOLIC PANEL
ALT: 228 U/L — ABNORMAL HIGH (ref 0–44)
AST: 175 U/L — ABNORMAL HIGH (ref 15–41)
Albumin: 2.7 g/dL — ABNORMAL LOW (ref 3.5–5.0)
Alkaline Phosphatase: 67 U/L (ref 38–126)
Anion gap: 8 (ref 5–15)
BUN: 19 mg/dL (ref 6–20)
CO2: 24 mmol/L (ref 22–32)
Calcium: 8.5 mg/dL — ABNORMAL LOW (ref 8.9–10.3)
Chloride: 106 mmol/L (ref 98–111)
Creatinine, Ser: 1.5 mg/dL — ABNORMAL HIGH (ref 0.61–1.24)
GFR, Estimated: 57 mL/min — ABNORMAL LOW (ref >60)
Glucose, Bld: 119 mg/dL — ABNORMAL HIGH (ref 70–99)
Potassium: 4 mmol/L (ref 3.5–5.1)
Sodium: 138 mmol/L (ref 135–145)
Total Bilirubin: 4.6 mg/dL — ABNORMAL HIGH (ref 0.3–1.2)
Total Protein: 5.6 g/dL — ABNORMAL LOW (ref 6.5–8.1)

## 2020-05-25 LAB — ECHOCARDIOGRAM COMPLETE
AR max vel: 1.73 cm2
AV Area VTI: 1.98 cm2
AV Area mean vel: 1.75 cm2
AV Mean grad: 7 mmHg
AV Peak grad: 13.2 mmHg
Ao pk vel: 1.82 m/s
Area-P 1/2: 2.76 cm2
Calc EF: 49.6 %
Height: 74 in
S' Lateral: 4.3 cm
Single Plane A2C EF: 50.6 %
Single Plane A4C EF: 47.6 %
Weight: 4056.46 oz

## 2020-05-25 LAB — CBC WITH DIFFERENTIAL/PLATELET
Abs Immature Granulocytes: 0.07 10*3/uL (ref 0.00–0.07)
Basophils Absolute: 0 10*3/uL (ref 0.0–0.1)
Basophils Relative: 0 %
Eosinophils Absolute: 0.1 10*3/uL (ref 0.0–0.5)
Eosinophils Relative: 1 %
HCT: 48 % (ref 39.0–52.0)
Hemoglobin: 16 g/dL (ref 13.0–17.0)
Immature Granulocytes: 1 %
Lymphocytes Relative: 6 %
Lymphs Abs: 0.7 10*3/uL (ref 0.7–4.0)
MCH: 28.1 pg (ref 26.0–34.0)
MCHC: 33.3 g/dL (ref 30.0–36.0)
MCV: 84.2 fL (ref 80.0–100.0)
Monocytes Absolute: 1.1 10*3/uL — ABNORMAL HIGH (ref 0.1–1.0)
Monocytes Relative: 9 %
Neutro Abs: 9.4 10*3/uL — ABNORMAL HIGH (ref 1.7–7.7)
Neutrophils Relative %: 83 %
Platelets: 220 10*3/uL (ref 150–400)
RBC: 5.7 MIL/uL (ref 4.22–5.81)
RDW: 13.4 % (ref 11.5–15.5)
WBC: 11.2 10*3/uL — ABNORMAL HIGH (ref 4.0–10.5)
nRBC: 0 % (ref 0.0–0.2)

## 2020-05-25 LAB — HIV ANTIBODY (ROUTINE TESTING W REFLEX): HIV Screen 4th Generation wRfx: NONREACTIVE

## 2020-05-25 LAB — ABO/RH: ABO/RH(D): O NEG

## 2020-05-25 LAB — RESPIRATORY PANEL BY RT PCR (FLU A&B, COVID)
Influenza A by PCR: NEGATIVE
Influenza B by PCR: NEGATIVE
SARS Coronavirus 2 by RT PCR: NEGATIVE

## 2020-05-25 LAB — PROTIME-INR
INR: 1.3 — ABNORMAL HIGH (ref 0.8–1.2)
Prothrombin Time: 15.3 seconds — ABNORMAL HIGH (ref 11.4–15.2)

## 2020-05-25 LAB — LACTIC ACID, PLASMA: Lactic Acid, Venous: 1.4 mmol/L (ref 0.5–1.9)

## 2020-05-25 MED ORDER — ASPIRIN 81 MG PO CHEW
81.0000 mg | CHEWABLE_TABLET | Freq: Two times a day (BID) | ORAL | Status: DC
  Administered 2020-05-25 – 2020-05-28 (×8): 81 mg via ORAL
  Filled 2020-05-25 (×8): qty 1

## 2020-05-25 MED ORDER — SODIUM CHLORIDE 0.9% FLUSH
3.0000 mL | Freq: Two times a day (BID) | INTRAVENOUS | Status: DC
  Administered 2020-05-25 – 2020-05-28 (×6): 3 mL via INTRAVENOUS

## 2020-05-25 MED ORDER — LACTATED RINGERS IV BOLUS
1000.0000 mL | Freq: Once | INTRAVENOUS | Status: AC
  Administered 2020-05-25: 1000 mL via INTRAVENOUS

## 2020-05-25 MED ORDER — MOMETASONE FURO-FORMOTEROL FUM 100-5 MCG/ACT IN AERO
2.0000 | INHALATION_SPRAY | Freq: Two times a day (BID) | RESPIRATORY_TRACT | Status: DC
  Filled 2020-05-25: qty 8.8

## 2020-05-25 MED ORDER — ENOXAPARIN SODIUM 40 MG/0.4ML ~~LOC~~ SOLN: 40.0000 mg | SUBCUTANEOUS | Status: DC

## 2020-05-25 MED ORDER — PIPERACILLIN-TAZOBACTAM 3.375 G IVPB
3.3750 g | Freq: Three times a day (TID) | INTRAVENOUS | Status: DC
  Administered 2020-05-25 – 2020-05-28 (×11): 3.375 g via INTRAVENOUS
  Filled 2020-05-25 (×12): qty 50

## 2020-05-25 MED ORDER — LACTATED RINGERS IV SOLN: INTRAVENOUS | Status: DC

## 2020-05-25 MED ORDER — HEPARIN SODIUM (PORCINE) 5000 UNIT/ML IJ SOLN: 5000.0000 [IU] | Freq: Three times a day (TID) | INTRAMUSCULAR | Status: DC

## 2020-05-25 MED ORDER — FENTANYL CITRATE (PF) 100 MCG/2ML IJ SOLN: 50.0000 ug | Freq: Four times a day (QID) | INTRAMUSCULAR | Status: DC | PRN

## 2020-05-25 MED ORDER — AMIODARONE HCL 200 MG PO TABS
200.0000 mg | ORAL_TABLET | Freq: Every day | ORAL | Status: DC
  Administered 2020-05-25 – 2020-05-28 (×4): 200 mg via ORAL
  Filled 2020-05-25 (×4): qty 1

## 2020-05-25 NOTE — Progress Notes (Signed)
Subjective:  [name] is a 49 year old male with past medical history significant for HFrEF secondary to non-ischemic dilated cardiomyopathy, recurrent V fib and V tach s/p ICD placement, HTN, HLD, CKD 2, prior unknown abdominal surgery presenting who presented to [facility] ED on 11/13 for sudden onset periumbilical abdominal pain found to have slight growth in pancreatic/duodenal lesion visible on CT renal study in 2019 as well as elevated LFTs with mild intra and extra-hepatic biliary ductal dilation in the setting of gallstones.  Overnight, no acute events.  This morning, patient reports that his abdominal pain has resolved and that he feels well. He denies any nausea, vomiting, diarrhea, fevers, chills. He understands the laboratory and imaging findings from this admission and has no further questions or concerns. He agrees with the plan for today.  Objective:  Vital signs in last 24 hours: Vitals:   05/25/20 1030 05/25/20 1100 05/25/20 1130 05/25/20 1200  BP: 113/64 114/68 106/63 107/65  Pulse:   (!) 50 (!) 50  Resp: (!) 24 (!) 24 (!) 26 (!) 28  Temp:      TempSrc:      SpO2:   96% 97%  Weight:      Height:      SpO2: 97 %  Intake/Output Summary (Last 24 hours) at 05/25/2020 1230 Last data filed at 05/25/2020 0809 Gross per 24 hour  Intake 3050 ml  Output --  Net 3050 ml   Filed Weights   05/24/20 1952  Weight: 115 kg  Physical Exam Constitutional:      Appearance: He is well-developed.  HENT:     Head: Normocephalic and atraumatic.  Cardiovascular:     Rate and Rhythm: Normal rate and regular rhythm.     Heart sounds: Normal heart sounds.  Pulmonary:     Effort: Pulmonary effort is normal. No respiratory distress.     Breath sounds: Normal breath sounds.  Abdominal:     General: Abdomen is flat. Bowel sounds are normal. There is no distension.     Palpations: Abdomen is soft.     Tenderness: There is no abdominal tenderness.  Skin:    General: Skin  is warm and dry.     Capillary Refill: Capillary refill takes less than 2 seconds.  Neurological:     General: No focal deficit present.     Mental Status: He is alert and oriented to person, place, and time.  Psychiatric:        Mood and Affect: Mood normal.        Behavior: Behavior normal.    CBC Latest Ref Rng & Units 05/25/2020 05/24/2020 01/21/2018  WBC 4.0 - 10.5 K/uL 11.2(H) 6.8 7.8  Hemoglobin 13.0 - 17.0 g/dL 16.0 17.3(H) 16.8  Hematocrit 39 - 52 % 48.0 52.4(H) 50.5  Platelets 150 - 400 K/uL 220 272 233   CMP Latest Ref Rng & Units 05/25/2020 05/24/2020 12/03/2019  Glucose 70 - 99 mg/dL 119(H) 121(H) 103(H)  BUN 6 - 20 mg/dL 19 20 15   Creatinine 0.61 - 1.24 mg/dL 1.50(H) 1.44(H) 1.27  Sodium 135 - 145 mmol/L 138 139 144  Potassium 3.5 - 5.1 mmol/L 4.0 3.6 4.6  Chloride 98 - 111 mmol/L 106 103 109(H)  CO2 22 - 32 mmol/L 24 26 21   Calcium 8.9 - 10.3 mg/dL 8.5(L) 8.9 9.5  Total Protein 6.5 - 8.1 g/dL 5.6(L) 6.2(L) -  Total Bilirubin 0.3 - 1.2 mg/dL 4.6(H) 3.2(H) -  Alkaline Phos 38 - 126 U/L 67  73 -  AST 15 - 41 U/L 175(H) 223(H) -  ALT 0 - 44 U/L 228(H) 216(H) -   INR - 1.3 Lactic acid - 1.4 down from 2.5 UA - unremarkable  CT ABDOMEN PELVIS W CONTRAST  Result Date: 05/24/2020 CLINICAL DATA:  Abdominal pain, fever, nausea EXAM: CT ABDOMEN AND PELVIS WITH CONTRAST TECHNIQUE: Multidetector CT imaging of the abdomen and pelvis was performed using the standard protocol following bolus administration of intravenous contrast. CONTRAST:  128mL OMNIPAQUE IOHEXOL 300 MG/ML  SOLN COMPARISON:  01/21/2018 FINDINGS: Lower chest: No acute pleural or parenchymal lung disease. Hepatobiliary: Calcified gallstones are identified. No gallbladder wall thickening or pericholecystic fluid. No evidence of choledocholithiasis. There is mild intrahepatic and extrahepatic biliary dilation, with common bile duct measuring 7 mm. No focal liver parenchymal abnormalities. Pancreas: There is a 4.7 x 4.4  x 4.5 cm solid mass between the uncinate process of the pancreas and the duodenal sweep. Mass demonstrates heterogeneous attenuation, likely representing enhancement. In comparison to 2019, this mass has demonstrated little interval growth measuring up to 4.6 x 4.2 cm previously. It is unclear whether the mass arises from the pancreas or the duodenal wall, and dedicated pancreatic MRI is recommended for further evaluation. The remainder of the pancreas is normal. Spleen: Normal in size without focal abnormality. Adrenals/Urinary Tract: Chronic right renal atrophy. Otherwise the kidneys enhance normally. The adrenals are unremarkable. Bladder is grossly normal. Stomach/Bowel: No bowel obstruction or ileus. Normal appendix right lower quadrant. No bowel wall thickening or inflammatory change. Vascular/Lymphatic: No significant vascular findings are present. No enlarged abdominal or pelvic lymph nodes. Reproductive: Prostate is unremarkable. Other: No free fluid or free gas.  No abdominal wall hernia. Musculoskeletal: No acute or destructive bony lesions. Reconstructed images demonstrate no additional findings. IMPRESSION: 1. 4.7 cm solid mass interposed between the uncinate process of the pancreas and duodenal sweep. 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 Dedicated pancreatic MRI is recommended for further evaluation. 2. Cholelithiasis without cholecystitis. 3. Intra and extrahepatic biliary duct dilation without evidence of choledocholithiasis. This could be evaluated at the time of follow-up MRI as well. Electronically Signed   By: Randa Ngo M.D.   On: 05/24/2020 22:51   MRCP - ordered  Assessment/Plan:  Active Problems:   Abdominal mass  [name] is a 49 year old male with past medical history significant for  HFrEF secondary to non-ischemic dilated cardiomyopathy, recurrent V fib and V tach s/p ICD placement, HTN, HLD, CKD 2, prior unknown abdominal surgery presenting who presented to [facility] ED on 11/13 for sudden onset periumbilical abdominal pain found to have slight growth in pancreatic/duodenal lesion visible on CT renal study in 2019 as well as elevated LFTs with mild intra and extra-hepatic biliary ductal dilation in the setting of gallstones.  #Acute hepatitis, active #Intra- and extra-hepatic biliary duct dilation, active #Cholelithiasis, active Patient presented with acute onset periumbilical abdominal pain found to have transaminitis and hyperbilirubinemia with radiologic evidence of cholelithiasis and intra/extrahepatic biliary duct dilation without cholecystitis. As patient was hypotensive, tachypneic, and temperature of 100.2 he was started on zosyn for concern of underlying intra-abdominal infection. GI consulted and feels that patient's cholelithiasis is likely the cause of his abdominal pain rather than his pancreatic/duodenal mass. Given this assessment, GI recommending general surgery consultation for consideration of cholecystectomy. -GI following, appreciate recommendations  -If LFTs  downtrend, consider cholecystectomy + IOC  -If no significant downtrend, ERCP prior to cholecystectomy -Continue LR 75cc/hr -Continue Zosyn -daily CMP -f/u viral hepatitis panel -f/u blood cultures  #Pancreatic/duodenal mass, chronic Patient presented with acute onset periumbilical abdominal pain. CT Abdomen/Pelvis revealed a 4.7cm mass between the pancreas and duodenum which was present on prior CT renal study in 2019. 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 -GI consulted, appreciate recommendations -General surgery  consulted  -If patient taken to OR for cholecystectomy, patient would benefit from biopsy of mass -NPO except for sips -MRCP (however unlikely to be able to undergo imaging due to ICD)  #HFrEF 2/2 non-ischemic dilated cardiomyopathy, chronic #Recurrent V fib and V tach s/p ICD placement #Bradycardia, chronic Last echo in 2020 with LVEF 05-69%, grade 2 diastolic dysfunction, moderate LV dilation.  Had recent follow up with cardiology. VF and VT have been well controlled with amiodarone 200mg . Note that dose was recently reduced from 400mg . -continue home ASA 81mg  and amiodarone 200mg  -holding home Entresto 24-26mg  BID and bisoprolol 10mg  daily for hypotension -telemetry -Cardiology consulted, appreciate recommendations  #Essential HTN, chronic Patient with history of hypertension found to be hypotensive on arrival to the ED. He has responded well to 3 liter boluses of LR and continuous IVF with blood pressures in the 794I/016P systolic. -Continue holding home bisoprolol and Entresto -Cardiology consulted, appreciate recommendations  #CKD II, chronic Creatinine of 1.44 on admission, slightly elevated to 1.50 following aggressive fluid resuscitation. Patient's baseline creatinine seems approximately 1.3 -Monitor creatinine on daily CMP -avoid nephrotoxins  #Asthma, chronic -Continue home symbicort  Cato Mulligan, MD 05/25/2020, 12:29 PM Pager: [phone] After 5pm on weekdays and 1pm on weekends: On Call pager [phone]

## 2020-05-25 NOTE — Progress Notes (Signed)
Pharmacy Antibiotic Note  [name] is a 49 y.o. male admitted on 05/24/2020 with intra-abdominal infection.  Pharmacy has been consulted for Zosyn dosing.  Plan: Zosyn 3.375g IV q8h (4 hour infusion).  Height: 6\' 2"  (188 cm) Weight: 115 kg (253 lb 8.5 oz) IBW/kg (Calculated) : 82.2  Temp (24hrs), Avg:100.2 F (37.9 C), Min:100.2 F (37.9 C), Max:100.2 F (37.9 C)  Recent Labs  Lab 05/24/20 1956 05/24/20 2030 05/24/20 2346  WBC 6.8  --   --   CREATININE 1.44*  --   --   LATICACIDVEN  --  2.5* 1.4    Estimated Creatinine Clearance: 84.6 mL/min (A) (by C-G formula based on SCr of 1.44 mg/dL (H)).    Allergies  Allergen Reactions  . Ace Inhibitors Cough    Thank you for allowing pharmacy to be a part of this patient's care.  Wynona Neat, PharmD, BCPS  05/25/2020 12:46 AM

## 2020-05-25 NOTE — Progress Notes (Signed)
  Echocardiogram 2D Echocardiogram has been performed.  [name] 05/25/2020, 2:26 PM

## 2020-05-25 NOTE — Consult Note (Signed)
CC: Abdominal pain  Requesting provider: Emergency Department  HPI: [name] is an 49 y.o. male who presents with abdominal pain, nausea without vomiting. The patient states that he hasn't felt "normal" in his abdomen for months to years, but the recent increase in symptoms (pain, nausea) caused him to present to the emergency department today. His labs in the ED were notable for a total bilirubin of 4.6. A CT abd/pelvis was performed which noted a 4.7 solid mass at the interface of the uncinate process of the pancreas and the duodenum. This was appreciated on scans dating back to 2019, but it has been slow growing. He has not been specifically followed for this abnormal imaging finding. He has never had an endoscopy to interrogate this mass. He notes no recent weight loss, normal bowel movements, and no blood in his stool. General surgery has been consulted for evaluation and further treatment recommendations.    Past Medical History:  Diagnosis Date  . Dyslipidemia   . Heart failure with mildly reduced EF    EF 35-40 in 2011 // Echo 06/2019: EF 45-50, Gr 2 DD, RAE, mild MR, mild TR, mild PI, RVSP 33.6  . HTN (hypertension)   . Hx of ventricular fibrillation    Hx of VFib arrest s/p AICD placement  . Nonischemic cardiomyopathy     Past Surgical History:  Procedure Laterality Date  . CARDIAC DEFIBRILLATOR PLACEMENT    . PACEMAKER IMPLANT    . repeat echo  2008    demonstrated near normalization  . TRANSTHORACIC ECHOCARDIOGRAM  11/09/2005, 01/15/2009, 02/07/2010    Family History  Problem Relation Age of Onset  . Diabetes Father   . Heart Problems Father        unsure of what kind  . Asthma Maternal Uncle   . Diabetes Maternal Grandmother     Social:  reports that he quit smoking about 21 years ago. His smoking use included cigarettes. He has a 0.50 pack-year smoking history. He has never used smokeless tobacco. He reports that he does not drink alcohol and does not use  drugs.  Allergies:  Allergies  Allergen Reactions  . Ace Inhibitors Cough    Medications: I have reviewed the patient's current medications.   ROS - all of the below systems have been reviewed with the patient and positives are indicated with bold text General: chills, fever or night sweats Eyes: blurry vision or double vision ENT: epistaxis or sore throat Allergy/Immunology: itchy/watery eyes or nasal congestion Hematologic/Lymphatic: bleeding problems, blood clots or swollen lymph nodes Endocrine: temperature intolerance or unexpected weight changes Breast: new or changing breast lumps or nipple discharge Resp: cough, shortness of breath, or wheezing CV: chest pain or dyspnea on exertion GI: as per HPI GU: dysuria, trouble voiding, or hematuria MSK: joint pain or joint stiffness Neuro: TIA or stroke symptoms Derm: pruritus and skin lesion changes Psych: anxiety and depression  PE Blood pressure (!) 111/58, pulse (!) 48, temperature 98.6 F (37 C), temperature source Oral, resp. rate (!) 24, height 6\' 2"  (1.88 m), weight 115 kg, SpO2 98 %. Constitutional: NAD; conversant; no deformities Eyes: Moist conjunctiva; no lid lag; anicteric; PERRL Neck: Trachea midline; no thyromegaly Lungs: Normal respiratory effort; no tactile fremitus CV: Bradycardic to the 40s, regular rhythm; no palpable thrills; no pitting edema GI: Abd soft, non-distended, tenderness to deep palpation in the epigastrium, no clinical Murphy's sign, no rebound or guarding, no palpable hepatosplenomegaly MSK: no clubbing/cyanosis Psychiatric: Appropriate affect; alert and oriented x3  Lymphatic: No palpable cervical or axillary lymphadenopathy Skin: No rashes appreciated  Results for orders placed or performed during the hospital encounter of 05/24/20 (from the past 48 hour(s))  Lipase, blood     Status: None   Collection Time: 05/24/20  7:56 PM  Result Value Ref Range   Lipase 31 11 - 51 U/L    Comment:  Performed at [facility], [address] [place], [place] [address]  Comprehensive metabolic panel     Status: Abnormal   Collection Time: 05/24/20  7:56 PM  Result Value Ref Range   Sodium 139 135 - 145 mmol/L   Potassium 3.6 3.5 - 5.1 mmol/L   Chloride 103 98 - 111 mmol/L   CO2 26 22 - 32 mmol/L   Glucose, Bld 121 (H) 70 - 99 mg/dL    Comment: Glucose reference range applies only to samples taken after fasting for at least 8 hours.   BUN 20 6 - 20 mg/dL   Creatinine, Ser 1.44 (H) 0.61 - 1.24 mg/dL   Calcium 8.9 8.9 - 10.3 mg/dL   Total Protein 6.2 (L) 6.5 - 8.1 g/dL   Albumin 3.2 (L) 3.5 - 5.0 g/dL   AST 223 (H) 15 - 41 U/L   ALT 216 (H) 0 - 44 U/L   Alkaline Phosphatase 73 38 - 126 U/L   Total Bilirubin 3.2 (H) 0.3 - 1.2 mg/dL   GFR, Estimated 60 (L) >60 mL/min    Comment: (NOTE) Calculated using the CKD-EPI Creatinine Equation (2021)    Anion gap 10 5 - 15    Comment: Performed at [facility], [place] [address], [place], [place] [address]  CBC     Status: Abnormal   Collection Time: 05/24/20  7:56 PM  Result Value Ref Range   WBC 6.8 4.0 - 10.5 K/uL   RBC 6.17 (H) 4.22 - 5.81 MIL/uL   Hemoglobin 17.3 (H) 13.0 - 17.0 g/dL   HCT 52.4 (H) 39 - 52 %   MCV 84.9 80.0 - 100.0 fL   MCH 28.0 26.0 - 34.0 pg   MCHC 33.0 30.0 - 36.0 g/dL   RDW 13.4 11.5 - 15.5 %   Platelets 272 150 - 400 K/uL   nRBC 0.0 0.0 - 0.2 %    Comment: Performed at [facility], [place] [address], [place], [place] [address]  Urinalysis, Routine w reflex microscopic Urine, Clean Catch     Status: Abnormal   Collection Time: 05/24/20  8:00 PM  Result Value Ref Range   Color, Urine AMBER (A) YELLOW    Comment: BIOCHEMICALS MAY BE AFFECTED BY COLOR   APPearance CLEAR CLEAR   Specific Gravity, Urine 1.014 1.005 - 1.030   pH 5.0 5.0 - 8.0   Glucose, UA NEGATIVE NEGATIVE mg/dL   Hgb urine dipstick NEGATIVE NEGATIVE   Bilirubin Urine SMALL (A) NEGATIVE   Ketones, ur NEGATIVE NEGATIVE mg/dL    Protein, ur NEGATIVE NEGATIVE mg/dL   Nitrite NEGATIVE NEGATIVE   Leukocytes,Ua NEGATIVE NEGATIVE    Comment: Performed at [facility] [address] [place], [place] [address]  Type and screen     Status: None   Collection Time: 05/24/20  8:30 PM  Result Value Ref Range   ABO/RH(D) O NEG    Antibody Screen NEG    Sample Expiration      05/27/2020,2359 Performed at [facility], [place] [address], [place], [place] [address]   Lactic  acid, plasma     Status: Abnormal   Collection Time: 05/24/20  8:30 PM  Result Value Ref Range   Lactic Acid, Venous 2.5 (HH) 0.5 - 1.9 mmol/L    Comment: CRITICAL RESULT CALLED TO, READ BACK BY AND VERIFIED WITH: Eulogio Bear RN [id number] Sander Radon Performed at [facility], [place] [address] [place], [place] [address]   Respiratory Panel by RT PCR (Flu A&B, Covid) - Nasopharyngeal Swab     Status: None   Collection Time: 05/24/20 11:10 PM   Specimen: Nasopharyngeal Swab  Result Value Ref Range   SARS Coronavirus 2 by RT PCR NEGATIVE NEGATIVE    Comment: (NOTE) SARS-CoV-2 target nucleic acids are NOT DETECTED.  The SARS-CoV-2 RNA is generally detectable in upper respiratoy specimens during the acute phase of infection. The lowest concentration of SARS-CoV-2 viral copies this assay can detect is 131 copies/mL. A negative result does not preclude SARS-Cov-2 infection and should not be used as the sole basis for treatment or other patient management decisions. A negative result may occur with  improper specimen collection/handling, submission of specimen other than nasopharyngeal swab, presence of viral mutation(s) within the areas targeted by this assay, and inadequate number of viral copies (<131 copies/mL). A negative result must be combined with clinical observations, patient history, and epidemiological information. The expected result is Negative.  Fact Sheet for Patients:   PinkCheek.be  Fact Sheet for Healthcare Providers:  GravelBags.it  This test is no t yet approved or cleared by the [place] FDA and  has been authorized for detection and/or diagnosis of SARS-CoV-2 by FDA under an Emergency Use Authorization (EUA). This EUA will remain  in effect (meaning this test can be used) for the duration of the COVID-19 declaration under Section 564(b)(1) of the Act, 21 U.S.C. section 360bbb-3(b)(1), unless the authorization is terminated or revoked sooner.     Influenza A by PCR NEGATIVE NEGATIVE   Influenza B by PCR NEGATIVE NEGATIVE    Comment: (NOTE) The Xpert Xpress SARS-CoV-2/FLU/RSV assay is intended as an aid in  the diagnosis of influenza from Nasopharyngeal swab specimens and  should not be used as a sole basis for treatment. Nasal washings and  aspirates are unacceptable for Xpert Xpress SARS-CoV-2/FLU/RSV  testing.  Fact Sheet for Patients: PinkCheek.be  Fact Sheet for Healthcare Providers: GravelBags.it  This test is not yet approved or cleared by the [place] FDA and  has been authorized for detection and/or diagnosis of SARS-CoV-2 by  FDA under an Emergency Use Authorization (EUA). This EUA will remain  in effect (meaning this test can be used) for the duration of the  Covid-19 declaration under Section 564(b)(1) of the Act, 21  U.S.C. section 360bbb-3(b)(1), unless the authorization is  terminated or revoked. Performed at [facility], [place] [address], [place], [place] [address]   Blood culture (routine x 2)     Status: None (Preliminary result)   Collection Time: 05/24/20 11:46 PM   Specimen: BLOOD RIGHT HAND  Result Value Ref Range   Specimen Description BLOOD RIGHT HAND    Special Requests      BOTTLES DRAWN AEROBIC AND ANAEROBIC Blood Culture results may not be optimal due to an inadequate volume of  blood received in culture bottles   Culture      NO GROWTH < 12 HOURS Performed at [facility] [address], [place],  [address]    Report Status PENDING   Blood culture (  routine x 2)     Status: None (Preliminary result)   Collection Time: 05/24/20 11:46 PM   Specimen: BLOOD  Result Value Ref Range   Specimen Description BLOOD RIGHT ANTECUBITAL    Special Requests      BOTTLES DRAWN AEROBIC AND ANAEROBIC Blood Culture adequate volume   Culture      NO GROWTH < 12 HOURS Performed at [facility], [place] [address], [place], [place] [address]    Report Status PENDING   Lactic acid, plasma     Status: None   Collection Time: 05/24/20 11:46 PM  Result Value Ref Range   Lactic Acid, Venous 1.4 0.5 - 1.9 mmol/L    Comment: Performed at [facility] [address] [place], [place] [address]  ABO/Rh     Status: None   Collection Time: 05/25/20  3:36 AM  Result Value Ref Range   ABO/RH(D)      O NEG Performed at [facility] [address], [place], [place] [address]   HIV Antibody (routine testing w rflx)     Status: None   Collection Time: 05/25/20  3:36 AM  Result Value Ref Range   HIV Screen 4th Generation wRfx Non Reactive Non Reactive    Comment: Performed at [facility], [place] [address] [place], [place] [address]  Comprehensive metabolic panel     Status: Abnormal   Collection Time: 05/25/20  3:36 AM  Result Value Ref Range   Sodium 138 135 - 145 mmol/L   Potassium 4.0 3.5 - 5.1 mmol/L   Chloride 106 98 - 111 mmol/L   CO2 24 22 - 32 mmol/L   Glucose, Bld 119 (H) 70 - 99 mg/dL    Comment: Glucose reference range applies only to samples taken after fasting for at least 8 hours.   BUN 19 6 - 20 mg/dL   Creatinine, Ser 1.50 (H) 0.61 - 1.24 mg/dL   Calcium 8.5 (L) 8.9 - 10.3 mg/dL   Total Protein 5.6 (L) 6.5 - 8.1 g/dL   Albumin 2.7 (L) 3.5 - 5.0 g/dL   AST 175 (H) 15 - 41 U/L   ALT 228 (H) 0 - 44 U/L   Alkaline Phosphatase 67 38  - 126 U/L   Total Bilirubin 4.6 (H) 0.3 - 1.2 mg/dL   GFR, Estimated 57 (L) >60 mL/min    Comment: (NOTE) Calculated using the CKD-EPI Creatinine Equation (2021)    Anion gap 8 5 - 15    Comment: Performed at [facility] [address], [place], [place] [address]  CBC with Differential/Platelet     Status: Abnormal   Collection Time: 05/25/20  3:36 AM  Result Value Ref Range   WBC 11.2 (H) 4.0 - 10.5 K/uL   RBC 5.70 4.22 - 5.81 MIL/uL   Hemoglobin 16.0 13.0 - 17.0 g/dL   HCT 48.0 39 - 52 %   MCV 84.2 80.0 - 100.0 fL   MCH 28.1 26.0 - 34.0 pg   MCHC 33.3 30.0 - 36.0 g/dL   RDW 13.4 11.5 - 15.5 %   Platelets 220 150 - 400 K/uL   nRBC 0.0 0.0 - 0.2 %   Neutrophils Relative % 83 %   Neutro Abs 9.4 (H) 1.7 - 7.7 K/uL   Lymphocytes Relative 6 %   Lymphs Abs 0.7 0.7 - 4.0 K/uL   Monocytes Relative 9 %   Monocytes Absolute 1.1 (H) 0.1 - 1.0 K/uL  Eosinophils Relative 1 %   Eosinophils Absolute 0.1 0.0 - 0.5 K/uL   Basophils Relative 0 %   Basophils Absolute 0.0 0.0 - 0.1 K/uL   Immature Granulocytes 1 %   Abs Immature Granulocytes 0.07 0.00 - 0.07 K/uL    Comment: Performed at [facility] [address], [place], [place] [address]  Protime-INR     Status: Abnormal   Collection Time: 05/25/20  3:36 AM  Result Value Ref Range   Prothrombin Time 15.3 (H) 11.4 - 15.2 seconds   INR 1.3 (H) 0.8 - 1.2    Comment: (NOTE) INR goal varies based on device and disease states. Performed at [facility], [place] [address], [place], [place] [address]     CT ABDOMEN PELVIS W CONTRAST  Result Date: 05/24/2020 CLINICAL DATA:  Abdominal pain, fever, nausea EXAM: CT ABDOMEN AND PELVIS WITH CONTRAST TECHNIQUE: Multidetector CT imaging of the abdomen and pelvis was performed using the standard protocol following bolus administration of intravenous contrast. CONTRAST:  168mL OMNIPAQUE IOHEXOL 300 MG/ML  SOLN COMPARISON:  01/21/2018 FINDINGS: Lower chest: No acute pleural or  parenchymal lung disease. Hepatobiliary: Calcified gallstones are identified. No gallbladder wall thickening or pericholecystic fluid. No evidence of choledocholithiasis. There is mild intrahepatic and extrahepatic biliary dilation, with common bile duct measuring 7 mm. No focal liver parenchymal abnormalities. Pancreas: There is a 4.7 x 4.4 x 4.5 cm solid mass between the uncinate process of the pancreas and the duodenal sweep. Mass demonstrates heterogeneous attenuation, likely representing enhancement. In comparison to 2019, this mass has demonstrated little interval growth measuring up to 4.6 x 4.2 cm previously. It is unclear whether the mass arises from the pancreas or the duodenal wall, and dedicated pancreatic MRI is recommended for further evaluation. The remainder of the pancreas is normal. Spleen: Normal in size without focal abnormality. Adrenals/Urinary Tract: Chronic right renal atrophy. Otherwise the kidneys enhance normally. The adrenals are unremarkable. Bladder is grossly normal. Stomach/Bowel: No bowel obstruction or ileus. Normal appendix right lower quadrant. No bowel wall thickening or inflammatory change. Vascular/Lymphatic: No significant vascular findings are present. No enlarged abdominal or pelvic lymph nodes. Reproductive: Prostate is unremarkable. Other: No free fluid or free gas.  No abdominal wall hernia. Musculoskeletal: No acute or destructive bony lesions. Reconstructed images demonstrate no additional findings. IMPRESSION: 1. 4.7 cm solid mass interposed between the uncinate process of the pancreas and duodenal sweep. 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 Dedicated pancreatic MRI is recommended for further evaluation. 2. Cholelithiasis without cholecystitis. 3. Intra and extrahepatic biliary duct  dilation without evidence of choledocholithiasis. This could be evaluated at the time of follow-up MRI as well. Electronically Signed   By: Randa Ngo M.D.   On: 05/24/2020 22:51   ECHOCARDIOGRAM COMPLETE  Result Date: 05/25/2020    ECHOCARDIOGRAM REPORT   Patient Name:   [name] Date of Exam: 05/25/2020 Medical Rec #:  [id number]     Height:       74.0 in Accession #:    [id number]    Weight:       253.5 lb Date of Birth:  July 03, 1971    BSA:          2.404 m Patient Age:    17 years      BP:  106/69 mmHg Patient Gender: M             HR:           52 bpm. Exam Location:  Inpatient Procedure: 2D Echo, Cardiac Doppler and Color Doppler Indications:    Cardiomyopathy-Unspecified  History:        Patient has prior history of Echocardiogram examinations, most                 recent 06/12/2019. CHF, Arrythmias:Ventricular Fibrillation; Risk                 Factors:Dyslipidemia, Hypertension and Former Smoker. AICD.  Sonographer:    Clayton Lefort RDCS (AE) Referring Phys: [place]  1. Left ventricular ejection fraction, by estimation, is 45 to 50%. The left ventricle has mildly decreased function. The left ventricle demonstrates global hypokinesis. The left ventricular internal cavity size was mildly dilated. There is mild concentric left ventricular hypertrophy. Left ventricular diastolic parameters are consistent with Grade III diastolic dysfunction (restrictive). Elevated left ventricular end-diastolic pressure.  2. Right ventricular systolic function is normal. The right ventricular size is normal. There is normal pulmonary artery systolic pressure. The estimated right ventricular systolic pressure is 62.3 mmHg.  3. Left atrial size was moderately dilated.  4. The mitral valve is normal in structure. Trivial mitral valve regurgitation. No evidence of mitral stenosis.  5. The aortic valve is normal in structure. Aortic valve regurgitation is not visualized. Mild aortic valve  sclerosis is present, with no evidence of aortic valve stenosis.  6. The inferior vena cava is normal in size with greater than 50% respiratory variability, suggesting right atrial pressure of 3 mmHg. FINDINGS  Left Ventricle: Left ventricular ejection fraction, by estimation, is 45 to 50%. The left ventricle has mildly decreased function. The left ventricle demonstrates global hypokinesis. The left ventricular internal cavity size was mildly dilated. There is  mild concentric left ventricular hypertrophy. Left ventricular diastolic parameters are consistent with Grade III diastolic dysfunction (restrictive). Elevated left ventricular end-diastolic pressure. Right Ventricle: The right ventricular size is normal. No increase in right ventricular wall thickness. Right ventricular systolic function is normal. There is normal pulmonary artery systolic pressure. The tricuspid regurgitant velocity is 2.59 m/s, and  with an assumed right atrial pressure of 3 mmHg, the estimated right ventricular systolic pressure is 76.2 mmHg. Left Atrium: Left atrial size was moderately dilated. Right Atrium: Right atrial size was normal in size. Pericardium: There is no evidence of pericardial effusion. Mitral Valve: The mitral valve is normal in structure. Trivial mitral valve regurgitation. No evidence of mitral valve stenosis. MV peak gradient, 7.3 mmHg. The mean mitral valve gradient is 2.0 mmHg. Tricuspid Valve: The tricuspid valve is normal in structure. Tricuspid valve regurgitation is trivial. No evidence of tricuspid stenosis. Aortic Valve: The aortic valve is normal in structure. Aortic valve regurgitation is not visualized. Mild aortic valve sclerosis is present, with no evidence of aortic valve stenosis. Aortic valve mean gradient measures 7.0 mmHg. Aortic valve peak gradient measures 13.2 mmHg. Aortic valve area, by VTI measures 1.98 cm. Pulmonic Valve: The pulmonic valve was normal in structure. Pulmonic valve  regurgitation is not visualized. No evidence of pulmonic stenosis. Aorta: The aortic root is normal in size and structure. Venous: The inferior vena cava is normal in size with greater than 50% respiratory variability, suggesting right atrial pressure of 3 mmHg. IAS/Shunts: No atrial level shunt detected by color flow Doppler. Additional Comments: A pacer wire  is visualized.  LEFT VENTRICLE PLAX 2D LVIDd:         5.80 cm      Diastology LVIDs:         4.30 cm      LV e' medial:    6.85 cm/s LV PW:         1.40 cm      LV E/e' medial:  19.7 LV IVS:        1.30 cm      LV e' lateral:   12.40 cm/s LVOT diam:     2.10 cm      LV E/e' lateral: 10.9 LV SV:         81 LV SV Index:   34 LVOT Area:     3.46 cm  LV Volumes (MOD) LV vol d, MOD A2C: 141.0 ml LV vol d, MOD A4C: 133.0 ml LV vol s, MOD A2C: 69.6 ml LV vol s, MOD A4C: 69.7 ml LV SV MOD A2C:     71.4 ml LV SV MOD A4C:     133.0 ml LV SV MOD BP:      67.8 ml RIGHT VENTRICLE             IVC RV Basal diam:  3.20 cm     IVC diam: 1.40 cm RV S prime:     15.70 cm/s TAPSE (M-mode): 2.4 cm LEFT ATRIUM              Index       RIGHT ATRIUM           Index LA diam:        4.00 cm  1.66 cm/m  RA Area:     23.40 cm LA Vol (A2C):   95.7 ml  39.81 ml/m RA Volume:   63.70 ml  26.50 ml/m LA Vol (A4C):   102.0 ml 42.43 ml/m LA Biplane Vol: 101.0 ml 42.01 ml/m  AORTIC VALVE AV Area (Vmax):    1.73 cm AV Area (Vmean):   1.75 cm AV Area (VTI):     1.98 cm AV Vmax:           182.00 cm/s AV Vmean:          127.000 cm/s AV VTI:            0.410 m AV Peak Grad:      13.2 mmHg AV Mean Grad:      7.0 mmHg LVOT Vmax:         90.70 cm/s LVOT Vmean:        64.000 cm/s LVOT VTI:          0.234 m LVOT/AV VTI ratio: 0.57  AORTA Ao Root diam: 3.00 cm Ao Asc diam:  3.00 cm MITRAL VALVE                TRICUSPID VALVE MV Area (PHT): 2.76 cm     TR Peak grad:   26.8 mmHg MV Peak grad:  7.3 mmHg     TR Vmax:        259.00 cm/s MV Mean grad:  2.0 mmHg MV Vmax:       1.35 m/s     SHUNTS MV  Vmean:      57.0 cm/s    Systemic VTI:  0.23 m MV Decel Time: 275 msec     Systemic Diam: 2.10 cm MV E velocity: 135.00 cm/s MV A velocity: 57.50 cm/s MV E/A ratio:  2.35 Traci Turner  MD Electronically signed by Fransico Him MD Signature Date/Time: 05/25/2020/2:37:18 PM    Final     Imaging: CT AP - 4.7 cm solid mass at the interface of D3/HOP as above  A/P: [name] is an 49 y.o. male with a duodenal vs pancreatic mass in the setting of biliary dilation, elevated transaminases, and elevated total bilirubin. Given the slow interval growth since 2019, this is unlikely to be an adenocarcinoma, but the etiology of the mass needs to be determined prior to considering operative management. Given the description and location of the patient's abdominal pain along with imaging finding, it is unlikely that the patient has acute cholecystitis at this time. Furthermore, with the patient's elevated bilirubin, formal ductal interrogation is required.  - Admit to the internal medicine service - Clear liquid diet after MRCP, NPO at midnight - MRCP (ordered) - Recommend ERCP for ductal interrogation, possible clearance, and possible stenting if indicated given clinical evidence of biliary obstruction  - Recommend EUS to further characterize the mass with biopsy for pathologic diagnosis - No acute surgical intervention indicated at this time - Antibiotics per primary team - General Surgery will continue to follow   Sheria Lang, MD General Surgery

## 2020-05-25 NOTE — Consult Note (Signed)
[facility] Consultation Note  Referring Provider: Internal Medicine Teaching Service Primary Care Physician:  Antony Blackbird, MD  Reason for Consultation:  Abdominal pain, fevers, elevated LFTs.  HPI: [name] is a 49 y.o. male with multiple comorbidities that we've been asked to see for above reasons.  Patient had sudden onset of periumbilical abdominal discomfort and generalized malaise starting yesterday.  Denies fevers, but has been feeling warm; had temperature 100.2 upon admission.  His pain has now resolved.  He denies prior abdominal pain episodes.  Has pancreatic versus duodenal lesion seen on imaging since at least 2019.  No weight loss or blood in stools or nausea vomiting or diarrhea.   Past Medical History:  Diagnosis Date  . CHF (congestive heart failure) (HCC)    chronic  . DCM (dilated cardiomyopathy) ([facility])   . Dyslipidemia   . HTN (hypertension)   . Hx of ventricular fibrillation    Hx of VFib arrest s/p AICD placement  . LV dysfunction    with an EF in 2007 at 34%    Past Surgical History:  Procedure Laterality Date  . CARDIAC DEFIBRILLATOR PLACEMENT    . PACEMAKER IMPLANT    . repeat echo  2008    demonstrated near normalization  . TRANSTHORACIC ECHOCARDIOGRAM  11/09/2005, 01/15/2009, 02/07/2010    Prior to Admission medications   Medication Sig Start Date End Date Taking? Authorizing Provider  amiodarone (PACERONE) 200 MG tablet Take 1 tablet (200 mg total) by mouth daily. 11/30/19  Yes Evans Lance, MD  aspirin 81 MG tablet Take 1 tablet (81 mg total) by mouth 2 (two) times daily. 09/22/15  Yes Funches, Josalyn, MD  bisoprolol (ZEBETA) 10 MG tablet Take 1 tablet (10 mg total) by mouth daily. 11/30/19  Yes Evans Lance, MD  budesonide-formoterol [facility]) 80-4.5 MCG/ACT inhaler Inhale 2 puffs into the lungs 2 (two) times daily. Must keep upcoming office visit for refills Patient taking differently: Inhale 2 puffs into the lungs 2 (two)  times daily as needed (for flares).  11/09/19  Yes Fulp, Cammie, MD  sacubitril-valsartan (ENTRESTO) 24-26 MG Take 1 tablet by mouth 2 (two) times daily. 11/30/19  Yes Evans Lance, MD  hydrocortisone (ANUSOL-HC) 2.5 % rectal cream Place 1 application rectally 2 (two) times daily. Patient not taking: Reported on 05/25/2020 05/17/20   Volney American, PA-C  predniSONE (DELTASONE) 10 MG tablet Take 6 tabs day one, 5 tabs day two, 4 tabs day three, etc Patient not taking: Reported on 05/25/2020 05/17/20   Volney American, PA-C  rosuvastatin (CRESTOR) 10 MG tablet Take 1 tablet (10 mg total) by mouth daily. To lower cholesterol 11/17/18 10/24/19  Antony Blackbird, MD    Current Facility-Administered Medications  Medication Dose Route Frequency Provider Last Rate Last Admin  . amiodarone (PACERONE) tablet 200 mg  200 mg Oral Daily Jose Persia, MD   200 mg at 05/25/20 0944  . aspirin chewable tablet 81 mg  81 mg Oral BID Jose Persia, MD   81 mg at 05/25/20 0944  . fentaNYL (SUBLIMAZE) injection 50 mcg  50 mcg Intravenous Q6H PRN Jose Persia, MD      . lactated ringers infusion   Intravenous Continuous Jose Persia, MD 75 mL/hr at 05/25/20 0350 New Bag at 05/25/20 0350  . mometasone-formoterol (DULERA) 100-5 MCG/ACT inhaler 2 puff  2 puff Inhalation BID Jose Persia, MD      . piperacillin-tazobactam (ZOSYN) IVPB 3.375 g  3.375 g Intravenous Q8H Bryk, Veronda P,  RPH 12.5 mL/hr at 05/25/20 0950 3.375 g at 05/25/20 0950  . sodium chloride flush (NS) 0.9 % injection 3 mL  3 mL Intravenous Q12H Jose Persia, MD   3 mL at 05/25/20 4098   Current Outpatient Medications  Medication Sig Dispense Refill  . amiodarone (PACERONE) 200 MG tablet Take 1 tablet (200 mg total) by mouth daily. 90 tablet 1  . aspirin 81 MG tablet Take 1 tablet (81 mg total) by mouth 2 (two) times daily. 30 tablet 11  . bisoprolol (ZEBETA) 10 MG tablet Take 1 tablet (10 mg total) by mouth daily. 90 tablet 1   . budesonide-formoterol (SYMBICORT) 80-4.5 MCG/ACT inhaler Inhale 2 puffs into the lungs 2 (two) times daily. Must keep upcoming office visit for refills (Patient taking differently: Inhale 2 puffs into the lungs 2 (two) times daily as needed (for flares). ) 1 Inhaler 0  . sacubitril-valsartan (ENTRESTO) 24-26 MG Take 1 tablet by mouth 2 (two) times daily. 180 tablet 1  . hydrocortisone (ANUSOL-HC) 2.5 % rectal cream Place 1 application rectally 2 (two) times daily. (Patient not taking: Reported on 05/25/2020) 30 g 0  . predniSONE (DELTASONE) 10 MG tablet Take 6 tabs day one, 5 tabs day two, 4 tabs day three, etc (Patient not taking: Reported on 05/25/2020) 21 tablet 0    Allergies as of 05/24/2020 - Review Complete 05/24/2020  Allergen Reaction Noted  . Ace inhibitors Cough 08/18/2015    Family History  Problem Relation Age of Onset  . Diabetes Father   . Heart Problems Father        unsure of what kind  . Asthma Maternal Uncle   . Diabetes Maternal Grandmother     Social History   Socioeconomic History  . Marital status: Married    Spouse name: Not on file  . Number of children: Not on file  . Years of education: Not on file  . Highest education level: Not on file  Occupational History    Employer: [employer]  Tobacco Use  . Smoking status: Former Smoker    Packs/day: 0.25    Years: 2.00    Pack years: 0.50    Types: Cigarettes    Quit date: 07/12/1998    Years since quitting: 21.8  . Smokeless tobacco: Never Used  Vaping Use  . Vaping Use: Never used  Substance and Sexual Activity  . Alcohol use: No    Comment: Denies  . Drug use: No  . Sexual activity: Not on file  Other Topics Concern  . Not on file  Social History Narrative   The patient is married.  He is in native of [place],     living in [place] for 9 years.  He has a history of tobacco use,     but quit smoking a year ago.  Denies alcohol abuse.         Social Determinants of Health   Financial Resource  Strain:   . Difficulty of Paying Living Expenses: Not on file  Food Insecurity:   . Worried About Charity fundraiser in the Last Year: Not on file  . Ran Out of Food in the Last Year: Not on file  Transportation Needs:   . Lack of Transportation (Medical): Not on file  . Lack of Transportation (Non-Medical): Not on file  Physical Activity:   . Days of Exercise per Week: Not on file  . Minutes of Exercise per Session: Not on file  Stress:   .  Feeling of Stress : Not on file  Social Connections:   . Frequency of Communication with Friends and Family: Not on file  . Frequency of Social Gatherings with Friends and Family: Not on file  . Attends Religious Services: Not on file  . Active Member of Clubs or Organizations: Not on file  . Attends Archivist Meetings: Not on file  . Marital Status: Not on file  Intimate Partner Violence:   . Fear of Current or Ex-Partner: Not on file  . Emotionally Abused: Not on file  . Physically Abused: Not on file  . Sexually Abused: Not on file    Review of Systems: As per HPI, all others negative  Physical Exam: Vital signs in last 24 hours: Temp:  [100.2 F (37.9 C)] 100.2 F (37.9 C) (11/13 2009) Pulse Rate:  [44-56] 48 (11/14 1000) Resp:  [14-31] 27 (11/14 1000) BP: (82-112)/(37-68) 112/57 (11/14 1000) SpO2:  [93 %-100 %] 99 % (11/14 1000) Weight:  [109 kg] 115 kg (11/13 1952)   General:   Alert,  Well-developed, well-nourished, pleasant and cooperative in NAD Head:  Normocephalic and atraumatic. Eyes:  Sclera clear, no icterus.   Conjunctiva pink. Ears:  Normal auditory acuity. Nose:  No deformity, discharge,  or lesions. Mouth:  No deformity or lesions.  Oropharynx pink & moist. Neck:  Supple; no masses or thyromegaly. Lungs:  Clear throughout to auscultation.   No wheezes, crackles, or rhonchi. No acute distress. Heart:  Regular rate and rhythm; no murmurs, clicks, rubs,  or gallops. Abdomen:  Soft, mild protuberant,  non-tender No masses, hepatosplenomegaly or hernias noted. Normal bowel sounds, without guarding, and without rebound.     Msk:  Symmetrical without gross deformities. Normal posture. Pulses:  Normal pulses noted. Extremities:  Without clubbing or edema. Neurologic:  Alert and  oriented x4;  grossly normal neurologically. Skin:  Intact without significant lesions or rashes. Psych:  Alert and cooperative. Normal mood and affect.   Lab Results: Recent Labs    05/24/20 1956 05/25/20 0336  WBC 6.8 11.2*  HGB 17.3* 16.0  HCT 52.4* 48.0  PLT 272 220   BMET Recent Labs    05/24/20 1956 05/25/20 0336  NA 139 138  K 3.6 4.0  CL 103 106  CO2 26 24  GLUCOSE 121* 119*  BUN 20 19  CREATININE 1.44* 1.50*  CALCIUM 8.9 8.5*   LFT Recent Labs    05/25/20 0336  PROT 5.6*  ALBUMIN 2.7*  AST 175*  ALT 228*  ALKPHOS 67  BILITOT 4.6*   PT/INR Recent Labs    05/25/20 0336  LABPROT 15.3*  INR 1.3*    Studies/Results: CT ABDOMEN PELVIS W CONTRAST  Result Date: 05/24/2020 CLINICAL DATA:  Abdominal pain, fever, nausea EXAM: CT ABDOMEN AND PELVIS WITH CONTRAST TECHNIQUE: Multidetector CT imaging of the abdomen and pelvis was performed using the standard protocol following bolus administration of intravenous contrast. CONTRAST:  132mL OMNIPAQUE IOHEXOL 300 MG/ML  SOLN COMPARISON:  01/21/2018 FINDINGS: Lower chest: No acute pleural or parenchymal lung disease. Hepatobiliary: Calcified gallstones are identified. No gallbladder wall thickening or pericholecystic fluid. No evidence of choledocholithiasis. There is mild intrahepatic and extrahepatic biliary dilation, with common bile duct measuring 7 mm. No focal liver parenchymal abnormalities. Pancreas: There is a 4.7 x 4.4 x 4.5 cm solid mass between the uncinate process of the pancreas and the duodenal sweep. Mass demonstrates heterogeneous attenuation, likely representing enhancement. In comparison to 2019, this mass has demonstrated  little  interval growth measuring up to 4.6 x 4.2 cm previously. It is unclear whether the mass arises from the pancreas or the duodenal wall, and dedicated pancreatic MRI is recommended for further evaluation. The remainder of the pancreas is normal. Spleen: Normal in size without focal abnormality. Adrenals/Urinary Tract: Chronic right renal atrophy. Otherwise the kidneys enhance normally. The adrenals are unremarkable. Bladder is grossly normal. Stomach/Bowel: No bowel obstruction or ileus. Normal appendix right lower quadrant. No bowel wall thickening or inflammatory change. Vascular/Lymphatic: No significant vascular findings are present. No enlarged abdominal or pelvic lymph nodes. Reproductive: Prostate is unremarkable. Other: No free fluid or free gas.  No abdominal wall hernia. Musculoskeletal: No acute or destructive bony lesions. Reconstructed images demonstrate no additional findings. IMPRESSION: 1. 4.7 cm solid mass interposed between the uncinate process of the pancreas and duodenal sweep. 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 Dedicated pancreatic MRI is recommended for further evaluation. 2. Cholelithiasis without cholecystitis. 3. Intra and extrahepatic biliary duct dilation without evidence of choledocholithiasis. This could be evaluated at the time of follow-up MRI as well. Electronically Signed   By: Randa Ngo M.D.   On: 05/24/2020 22:51   Impression:  1.  New onset periumbilical abdominal pain. 2.  Elevated LFTs. Mild intra- and extrahepatic biliary ductal dilatation. 3.  Mass in vicinity of duodenal sweep and uncinate pancreas.  Essentially unchanged in size since 2019. 4.  Gallstones. 5.  I suspect patient's symptoms are from gallstones; I do not think this benign-acting duodenal/pancreatic lesion  has anything to do with patient's symptoms and from my review of the scan this lesion is not causing any direct mass effect upon the biliary tree. 6.  Multiple cardiac comorbidities including dilated cardiomyopathy, CHF (last EF December 2020 45-50%), AICD due to prior V. Fib arrest.  Plan:  1.  Medical management including IVF, antibiotics, analgesics as needed. 2.  MRI/MRCP has been ordered, but barring some type of unique circumstances, I don't think he can have this due to his AICD. 3.  Follow LFTs. 4.  Cardiology consultation for clearance for possible endoscopic and possible surgical interventions. 5.  Surgical consultation. 6.  If LFTs downtrend, consider cholecystectomy + IOC; if no significant downtrend of LFTs, would do ERCP prior to cholecystectomy.  I do not see the utility of EUS in this setting. 7.  If surgery is ultimately done, would consider intraoperative biopsy of this benign-acting lesion. 8.  Clear liquid diet ok from GI perspective. 9.  [facility] will follow.    LOS: 0 days   Jase Himmelberger M  05/25/2020, 10:23 AM  Cell [phone] If no answer or after 5 PM call [phone]

## 2020-05-25 NOTE — Consult Note (Signed)
Cardiology Consultation:   Patient ID: [name] MRN: [id number]; DOB: 20-Apr-1971  Admit date: 05/24/2020 Date of Consult: 05/25/2020  Primary Care Provider: Antony Blackbird, MD [facility] Cardiologist/Electrophysiologist: Cristopher Peru, MD     Patient Profile:   [name] is a 49 y.o. male with a hx of heart failure with mildly reduced ejection fraction (EF 35-40 in 2011 >> improved to 45-50 in 2020), non-ischemic cardiomyopathy, s/p AICD, VT/VF on Amiodarone Rx, hypertension, hyperlipidemia who is being seen today for the evaluation of surgical clearance at the request of Dr. Paulita Fujita.  History of Present Illness:   [name] presented to the hospital with abdominal pain and elevated LFTs.  He has a 4.7 cm mass on CT between his pancreas and duodenum as well as cholelithiasis.  He has been seen by gastroenterology.  It is felt that his symptoms are from his gallstones.  The duodenal/pancreatic lesion is felt to likely be benign.  His work-up will likely include endoscopic and possible surgical interventions.  Therefore, cardiology has been asked to evaluate for surgical clearance.  The patient has not had any chest discomfort.  He does get short of breath with more extreme activities but no significant change in his breathing.  Of note, he last saw Dr. Lovena Le in October and was set up for PFTs to rule out changes due to amiodarone.  He has not had syncope or ICD shock.  Prior CV Studies Echocardiogram 06/12/2019 EF 45-50, Gr 2 DD, mild RAE, mild MR, mild TR, midl PI, RVSP 33.6   Past Medical History:  Diagnosis Date  . CHF (congestive heart failure) (HCC)    chronic  . DCM (dilated cardiomyopathy) ([place])   . Dyslipidemia   . HTN (hypertension)   . Hx of ventricular fibrillation    Hx of VFib arrest s/p AICD placement  . LV dysfunction    with an EF in 2007 at 34%    Past Surgical History:  Procedure Laterality Date  . CARDIAC DEFIBRILLATOR PLACEMENT    . PACEMAKER  IMPLANT    . repeat echo  2008    demonstrated near normalization  . TRANSTHORACIC ECHOCARDIOGRAM  11/09/2005, 01/15/2009, 02/07/2010     Home Medications:  Prior to Admission medications   Medication Sig Start Date End Date Taking? Authorizing Provider  amiodarone (PACERONE) 200 MG tablet Take 1 tablet (200 mg total) by mouth daily. 11/30/19  Yes Evans Lance, MD  aspirin 81 MG tablet Take 1 tablet (81 mg total) by mouth 2 (two) times daily. 09/22/15  Yes Funches, Josalyn, MD  bisoprolol (ZEBETA) 10 MG tablet Take 1 tablet (10 mg total) by mouth daily. 11/30/19  Yes Evans Lance, MD  budesonide-formoterol [facility]) 80-4.5 MCG/ACT inhaler Inhale 2 puffs into the lungs 2 (two) times daily. Must keep upcoming office visit for refills Patient taking differently: Inhale 2 puffs into the lungs 2 (two) times daily as needed (for flares).  11/09/19  Yes Fulp, Cammie, MD  sacubitril-valsartan (ENTRESTO) 24-26 MG Take 1 tablet by mouth 2 (two) times daily. 11/30/19  Yes Evans Lance, MD  hydrocortisone (ANUSOL-HC) 2.5 % rectal cream Place 1 application rectally 2 (two) times daily. Patient not taking: Reported on 05/25/2020 05/17/20   Volney American, PA-C  predniSONE (DELTASONE) 10 MG tablet Take 6 tabs day one, 5 tabs day two, 4 tabs day three, etc Patient not taking: Reported on 05/25/2020 05/17/20   Volney American, PA-C  rosuvastatin (CRESTOR) 10 MG tablet Take 1 tablet (10  mg total) by mouth daily. To lower cholesterol 11/17/18 10/24/19  Fulp, Ander Gaster, MD    Inpatient Medications: Scheduled Meds: . amiodarone  200 mg Oral Daily  . aspirin  81 mg Oral BID  . mometasone-formoterol  2 puff Inhalation BID  . sodium chloride flush  3 mL Intravenous Q12H   Continuous Infusions: . lactated ringers 75 mL/hr at 05/25/20 0350  . piperacillin-tazobactam (ZOSYN)  IV 3.375 g (05/25/20 0950)   PRN Meds: fentaNYL (SUBLIMAZE) injection  Allergies:    Allergies  Allergen Reactions  .  Ace Inhibitors Cough    Social History:   Social History   Socioeconomic History  . Marital status: Married    Spouse name: Not on file  . Number of children: Not on file  . Years of education: Not on file  . Highest education level: Not on file  Occupational History    Employer: [employer]  Tobacco Use  . Smoking status: Former Smoker    Packs/day: 0.25    Years: 2.00    Pack years: 0.50    Types: Cigarettes    Quit date: 07/12/1998    Years since quitting: 21.8  . Smokeless tobacco: Never Used  Vaping Use  . Vaping Use: Never used  Substance and Sexual Activity  . Alcohol use: No    Comment: Denies  . Drug use: No  . Sexual activity: Not on file  Other Topics Concern  . Not on file  Social History Narrative   The patient is married.  He is in native of [place],     living in [place] for 9 years.  He has a history of tobacco use,     but quit smoking a year ago.  Denies alcohol abuse.         Social Determinants of Health   Financial Resource Strain:   . Difficulty of Paying Living Expenses: Not on file  Food Insecurity:   . Worried About Charity fundraiser in the Last Year: Not on file  . Ran Out of Food in the Last Year: Not on file  Transportation Needs:   . Lack of Transportation (Medical): Not on file  . Lack of Transportation (Non-Medical): Not on file  Physical Activity:   . Days of Exercise per Week: Not on file  . Minutes of Exercise per Session: Not on file  Stress:   . Feeling of Stress : Not on file  Social Connections:   . Frequency of Communication with Friends and Family: Not on file  . Frequency of Social Gatherings with Friends and Family: Not on file  . Attends Religious Services: Not on file  . Active Member of Clubs or Organizations: Not on file  . Attends Archivist Meetings: Not on file  . Marital Status: Not on file  Intimate Partner Violence:   . Fear of Current or Ex-Partner: Not on file  . Emotionally Abused: Not on file   . Physically Abused: Not on file  . Sexually Abused: Not on file    Family History:    Family History  Problem Relation Age of Onset  . Diabetes Father   . Heart Problems Father        unsure of what kind  . Asthma Maternal Uncle   . Diabetes Maternal Grandmother      ROS:  Please see the history of present illness.  All other ROS reviewed and negative.     Physical Exam/Data:   Vitals:  05/25/20 1030 05/25/20 1100 05/25/20 1130 05/25/20 1200  BP: 113/64 114/68 106/63 107/65  Pulse:   (!) 50 (!) 50  Resp: (!) 24 (!) 24 (!) 26 (!) 28  Temp:      TempSrc:      SpO2:   96% 97%  Weight:      Height:        Intake/Output Summary (Last 24 hours) at 05/25/2020 1229 Last data filed at 05/25/2020 0809 Gross per 24 hour  Intake 3050 ml  Output --  Net 3050 ml   Last 3 Weights 05/24/2020 04/29/2020 04/25/2020  Weight (lbs) 253 lb 8.5 oz 237 lb 3.2 oz 236 lb 9.6 oz  Weight (kg) 115 kg 107.593 kg 107.321 kg     Body mass index is 32.55 kg/m.  General:  Well nourished, well developed, in no acute distress  HEENT: normal Lymph: no adenopathy Neck: no JVD Endocrine:  No thryomegaly Vascular: DP/PT 2+ bilaterally Cardiac:  normal S1, S2; RRR; no murmur  Lungs:  clear to auscultation bilaterally, no wheezing, rhonchi or rales  Abd: soft  Ext: no edema Musculoskeletal:  No deformities Skin: warm and dry  Neuro:  CNs 2-12 intact, no focal abnormalities noted Psych:  Normal affect   EKG:  The EKG was personally reviewed and demonstrates: Sinus bradycardia, HR 48, nonspecific interventricular conduction delay, QTC 483   Laboratory Data:  High Sensitivity Troponin:  No results for input(s): TROPONINIHS in the last 720 hours.   Chemistry Recent Labs  Lab 05/24/20 1956 05/25/20 0336  NA 139 138  K 3.6 4.0  CL 103 106  CO2 26 24  GLUCOSE 121* 119*  BUN 20 19  CREATININE 1.44* 1.50*  CALCIUM 8.9 8.5*  GFRNONAA 60* 57*  ANIONGAP 10 8    Recent Labs  Lab  05/24/20 1956 05/25/20 0336  PROT 6.2* 5.6*  ALBUMIN 3.2* 2.7*  AST 223* 175*  ALT 216* 228*  ALKPHOS 73 67  BILITOT 3.2* 4.6*   Hematology Recent Labs  Lab 05/24/20 1956 05/25/20 0336  WBC 6.8 11.2*  RBC 6.17* 5.70  HGB 17.3* 16.0  HCT 52.4* 48.0  MCV 84.9 84.2  MCH 28.0 28.1  MCHC 33.0 33.3  RDW 13.4 13.4  PLT 272 220   BNPNo results for input(s): BNP, PROBNP in the last 168 hours.  DDimer No results for input(s): DDIMER in the last 168 hours.   Radiology/Studies:  CT ABDOMEN PELVIS W CONTRAST  Result Date: 05/24/2020 CLINICAL DATA:  Abdominal pain, fever, nausea EXAM: CT ABDOMEN AND PELVIS WITH CONTRAST TECHNIQUE: Multidetector CT imaging of the abdomen and pelvis was performed using the standard protocol following bolus administration of intravenous contrast. CONTRAST:  159mL OMNIPAQUE IOHEXOL 300 MG/ML  SOLN COMPARISON:  01/21/2018 FINDINGS: Lower chest: No acute pleural or parenchymal lung disease. Hepatobiliary: Calcified gallstones are identified. No gallbladder wall thickening or pericholecystic fluid. No evidence of choledocholithiasis. There is mild intrahepatic and extrahepatic biliary dilation, with common bile duct measuring 7 mm. No focal liver parenchymal abnormalities. Pancreas: There is a 4.7 x 4.4 x 4.5 cm solid mass between the uncinate process of the pancreas and the duodenal sweep. Mass demonstrates heterogeneous attenuation, likely representing enhancement. In comparison to 2019, this mass has demonstrated little interval growth measuring up to 4.6 x 4.2 cm previously. It is unclear whether the mass arises from the pancreas or the duodenal wall, and dedicated pancreatic MRI is recommended for further evaluation. The remainder of the pancreas is normal. Spleen: Normal in  size without focal abnormality. Adrenals/Urinary Tract: Chronic right renal atrophy. Otherwise the kidneys enhance normally. The adrenals are unremarkable. Bladder is grossly normal.  Stomach/Bowel: No bowel obstruction or ileus. Normal appendix right lower quadrant. No bowel wall thickening or inflammatory change. Vascular/Lymphatic: No significant vascular findings are present. No enlarged abdominal or pelvic lymph nodes. Reproductive: Prostate is unremarkable. Other: No free fluid or free gas.  No abdominal wall hernia. Musculoskeletal: No acute or destructive bony lesions. Reconstructed images demonstrate no additional findings. IMPRESSION: 1. 4.7 cm solid mass interposed between the uncinate process of the pancreas and duodenal sweep. 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 Dedicated pancreatic MRI is recommended for further evaluation. 2. Cholelithiasis without cholecystitis. 3. Intra and extrahepatic biliary duct dilation without evidence of choledocholithiasis. This could be evaluated at the time of follow-up MRI as well. Electronically Signed   By: Randa Ngo M.D.   On: 05/24/2020 22:51     Assessment and Plan:   1.  Surgical clearance   [name]'s perioperative risk of a major cardiac event is 6.6% according to the Revised Cardiac Risk Index (RCRI).  Therefore, he is at high risk for perioperative complications.   His functional capacity is fair at 4.64 METs according to the Duke Activity Status Index (DASI). Recommendations: The patient requires an echocardiogram before a disposition can be made regarding surgical risk.            2. Heart failure with reduced ejection fraction  Non-ischemic cardiomyopathy.  NYHA II-IIb.  Volume status is stable.  As noted, we will obtain an echocardiogram to reassess LV function before making final recommendations for surgical risk.  As long as his EF is unchanged, he will be able to proceed at acceptable risk.  3. S/p ICD He has not had any ICD  discharges.  4. VT/VF He is on Amiodarone for suppression.  Elevated LFTs are most likely related to GI pathology and not amiodarone.          New York Heart Association (NYHA) Functional Class NYHA Class II        For questions or updates, please contact [facility] Please consult [email or web address] for contact info under    Signed, Richardson Dopp, PA-C  05/25/2020 12:29 PM

## 2020-05-26 DIAGNOSIS — Z01810 Encounter for preprocedural cardiovascular examination: Secondary | ICD-10-CM

## 2020-05-26 DIAGNOSIS — I2583 Coronary atherosclerosis due to lipid rich plaque: Secondary | ICD-10-CM

## 2020-05-26 DIAGNOSIS — I10 Essential (primary) hypertension: Secondary | ICD-10-CM

## 2020-05-26 DIAGNOSIS — I428 Other cardiomyopathies: Secondary | ICD-10-CM

## 2020-05-26 DIAGNOSIS — I2510 Atherosclerotic heart disease of native coronary artery without angina pectoris: Secondary | ICD-10-CM

## 2020-05-26 LAB — CBC
HCT: 47.4 % (ref 39.0–52.0)
Hemoglobin: 15.5 g/dL (ref 13.0–17.0)
MCH: 27.6 pg (ref 26.0–34.0)
MCHC: 32.7 g/dL (ref 30.0–36.0)
MCV: 84.3 fL (ref 80.0–100.0)
Platelets: 140 10*3/uL — ABNORMAL LOW (ref 150–400)
RBC: 5.62 MIL/uL (ref 4.22–5.81)
RDW: 13.7 % (ref 11.5–15.5)
WBC: 5.8 10*3/uL (ref 4.0–10.5)
nRBC: 0 % (ref 0.0–0.2)

## 2020-05-26 LAB — COMPREHENSIVE METABOLIC PANEL
ALT: 148 U/L — ABNORMAL HIGH (ref 0–44)
AST: 58 U/L — ABNORMAL HIGH (ref 15–41)
Albumin: 2.3 g/dL — ABNORMAL LOW (ref 3.5–5.0)
Alkaline Phosphatase: 67 U/L (ref 38–126)
Anion gap: 7 (ref 5–15)
BUN: 11 mg/dL (ref 6–20)
CO2: 26 mmol/L (ref 22–32)
Calcium: 8.6 mg/dL — ABNORMAL LOW (ref 8.9–10.3)
Chloride: 107 mmol/L (ref 98–111)
Creatinine, Ser: 1.41 mg/dL — ABNORMAL HIGH (ref 0.61–1.24)
GFR, Estimated: >60 mL/min (ref >60)
Glucose, Bld: 103 mg/dL — ABNORMAL HIGH (ref 70–99)
Potassium: 3.9 mmol/L (ref 3.5–5.1)
Sodium: 140 mmol/L (ref 135–145)
Total Bilirubin: 5.4 mg/dL — ABNORMAL HIGH (ref 0.3–1.2)
Total Protein: 5.4 g/dL — ABNORMAL LOW (ref 6.5–8.1)

## 2020-05-26 LAB — BILIRUBIN, DIRECT: Bilirubin, Direct: 2.9 mg/dL — ABNORMAL HIGH (ref 0.0–0.2)

## 2020-05-26 MED ORDER — PNEUMOCOCCAL VAC POLYVALENT 25 MCG/0.5ML IJ INJ
0.5000 mL | INJECTION | INTRAMUSCULAR | Status: DC
  Filled 2020-05-26: qty 0.5

## 2020-05-26 NOTE — Progress Notes (Signed)
Issues:  1.  Abdominal pain: Resolved  2.  Elevated liver chemistries: Transaminases coming down (were completely normal a year ago), bilirubin going up slightly.  Per personal communication, Dr. Paulita Solomon, on review of CT, is quite suspicious of 1 or 2 stones being present in the bile duct.  3.  Pancreaticoduodenal mass: Radiographically stable over the past 2 years, felt to be incidental, character unclear.  Surgical consultation suggests EUS.  Dr. Paulita Solomon, who does EUS, was inclined to think that this lesion is best left alone in this patient with multiple comorbidities, or if it were to be potentially operated upon, to consider referral to a tertiary care center.  Exam: Patient lying in bed in no distress.  Hungry.  Anicteric.  Chest clear, heart rhythm regular, abdomen has normal bowel sounds and is soft and nontender  Plan:  1.  ERCP tomorrow.  It is scheduled for 2 PM by Dr. Clarene Solomon.  [name], purpose, and risks reviewed in detail with the patient and his wife at the bedside.  Risks included anesthesia risk taking into account his heart condition, bleeding, infection, and a 2 to 3% risk of pancreatitis which can range from mild or moderate to fatal in severity.  2.  Dr. Paulita Solomon is not available for EUS tomorrow.  Our highest priority is to get the bile duct taking care of.  Depending on the ERCP findings, attention can then be given to the pancreaticoduodenal lesion, including whether, when, and where to have it evaluated.  Incidental notation is made by the patient of recent foot and knee pain in the setting of a prior history of gout.  He does not appear to have an acute arthropathy but he does say it hurts to walk.  I have made Dr. Zigmund Solomon, of the internal medicine service, aware of this.  Time spent at bedside, coordination of care, and doing dictation 55 minutes.  [name], M.D. Pager [phone] If no answer or after 5 PM call [phone]

## 2020-05-26 NOTE — Plan of Care (Signed)
  Problem: Safety: Goal: Ability to remain free from injury will improve Outcome: Progressing   Problem: Pain Managment: Goal: General experience of comfort will improve Outcome: Progressing   Problem: Activity: Goal: Risk for activity intolerance will decrease Outcome: Progressing   Problem: Education: Goal: Knowledge of General Education information will improve Description: Including pain rating scale, medication(s)/side effects and non-pharmacologic comfort measures Outcome: Progressing

## 2020-05-26 NOTE — Progress Notes (Signed)
Subjective:  Mr. Masud Holub is a 49 year old male with past medical history significant for HFrEF secondary to non-ischemic dilated cardiomyopathy, recurrent V fib and V tach s/p ICD placement, HTN, HLD, and CKD2 who presented to [facility] ED on 11/13 for sudden onset periumbilical abdominal pain found to have mild growth in pancreatic/duodenal mass visible on CT from 2019 as well as transaminitis, hyperbilirubinemia with mild intra and extra-hepatic biliary ductal dilation in the setting of gallstones.  Overnight, no acute events.  This morning, patient reports feeling well and denies any abdominal pain at this time. He understands and agrees with the plan for today to await general surgery and gastroenterology recommendations based on his morning labs. He has no further questions or concerns.  Objective:  Vital signs in last 24 hours: Vitals:   05/25/20 2130 05/25/20 2200 05/25/20 2317 05/26/20 0509  BP: 124/69 119/73 (!) 111/56 112/72  Pulse: (!) 53 (!) 49 (!) 54 (!) 55  Resp: 16 20 18 19   Temp:   98.8 F (37.1 C) 98.6 F (37 C)  TempSrc:   Oral Oral  SpO2: 99% 99% 100% 100%  Weight:      Height:      SpO2: 100 %  Intake/Output Summary (Last 24 hours) at 05/26/2020 0645 Last data filed at 05/26/2020 0500 Gross per 24 hour  Intake 1120.83 ml  Output 1400 ml  Net -279.17 ml   Filed Weights   05/24/20 1952  Weight: 115 kg  Physical Exam Constitutional:      Appearance: He is well-developed.  HENT:     Head: Normocephalic and atraumatic.  Cardiovascular:     Rate and Rhythm: Regular rhythm. Bradycardia present.     Heart sounds: Normal heart sounds.  Pulmonary:     Effort: Pulmonary effort is normal. No respiratory distress.     Breath sounds: Normal breath sounds.  Abdominal:     General: Abdomen is flat. Bowel sounds are normal. There is no distension.     Palpations: Abdomen is soft.     Tenderness: There is no abdominal tenderness.  Skin:    General: Skin  is warm and dry.     Capillary Refill: Capillary refill takes less than 2 seconds.  Neurological:     General: No focal deficit present.     Mental Status: He is alert and oriented to person, place, and time.  Psychiatric:        Mood and Affect: Mood normal.        Behavior: Behavior normal.    CBC Latest Ref Rng & Units 05/26/2020 05/25/2020 05/24/2020  WBC 4.0 - 10.5 K/uL 5.8 11.2(H) 6.8  Hemoglobin 13.0 - 17.0 g/dL 15.5 16.0 17.3(H)  Hematocrit 39 - 52 % 47.4 48.0 52.4(H)  Platelets 150 - 400 K/uL 140(L) 220 272   CMP Latest Ref Rng & Units 05/26/2020 05/25/2020 05/24/2020  Glucose 70 - 99 mg/dL 103(H) 119(H) 121(H)  BUN 6 - 20 mg/dL 11 19 20   Creatinine 0.61 - 1.24 mg/dL 1.41(H) 1.50(H) 1.44(H)  Sodium 135 - 145 mmol/L 140 138 139  Potassium 3.5 - 5.1 mmol/L 3.9 4.0 3.6  Chloride 98 - 111 mmol/L 107 106 103  CO2 22 - 32 mmol/L 26 24 26   Calcium 8.9 - 10.3 mg/dL 8.6(L) 8.5(L) 8.9  Total Protein 6.5 - 8.1 g/dL 5.4(L) 5.6(L) 6.2(L)  Total Bilirubin 0.3 - 1.2 mg/dL 5.4(H) 4.6(H) 3.2(H)  Alkaline Phos 38 - 126 U/L 67 67 73  AST 15 - 41  U/L 58(H) 175(H) 223(H)  ALT 0 - 44 U/L 148(H) 228(H) 216(H)   CT ABDOMEN PELVIS W CONTRAST  Addendum Date: 05/25/2020   ADDENDUM REPORT: 05/25/2020 17:21 ADDENDUM: Case was discussed with Dr. Redmond Pulling. There is an indwelling AICD, precluding evaluation with MRI. Given the findings within the gallbladder and common bile duct, endoscopy may be useful for further evaluation. This could allow for evaluation of the biliary system as well as possible EUS/biopsy for further characterization of the mass. If desired, dedicated multiphase pancreatic CT may add some additional information. Electronically Signed   By: Randa Ngo M.D.   On: 05/25/2020 17:21   Result Date: 05/25/2020 CLINICAL DATA:  Abdominal pain, fever, nausea EXAM: CT ABDOMEN AND PELVIS WITH CONTRAST TECHNIQUE: Multidetector CT imaging of the abdomen and pelvis was performed using the  standard protocol following bolus administration of intravenous contrast. CONTRAST:  131mL OMNIPAQUE IOHEXOL 300 MG/ML  SOLN COMPARISON:  01/21/2018 FINDINGS: Lower chest: No acute pleural or parenchymal lung disease. Hepatobiliary: Calcified gallstones are identified. No gallbladder wall thickening or pericholecystic fluid. No evidence of choledocholithiasis. There is mild intrahepatic and extrahepatic biliary dilation, with common bile duct measuring 7 mm. No focal liver parenchymal abnormalities. Pancreas: There is a 4.7 x 4.4 x 4.5 cm solid mass between the uncinate process of the pancreas and the duodenal sweep. Mass demonstrates heterogeneous attenuation, likely representing enhancement. In comparison to 2019, this mass has demonstrated little interval growth measuring up to 4.6 x 4.2 cm previously. It is unclear whether the mass arises from the pancreas or the duodenal wall, and dedicated pancreatic MRI is recommended for further evaluation. The remainder of the pancreas is normal. Spleen: Normal in size without focal abnormality. Adrenals/Urinary Tract: Chronic right renal atrophy. Otherwise the kidneys enhance normally. The adrenals are unremarkable. Bladder is grossly normal. Stomach/Bowel: No bowel obstruction or ileus. Normal appendix right lower quadrant. No bowel wall thickening or inflammatory change. Vascular/Lymphatic: No significant vascular findings are present. No enlarged abdominal or pelvic lymph nodes. Reproductive: Prostate is unremarkable. Other: No free fluid or free gas.  No abdominal wall hernia. Musculoskeletal: No acute or destructive bony lesions. Reconstructed images demonstrate no additional findings. IMPRESSION: 1. 4.7 cm solid mass interposed between the uncinate process of the pancreas and duodenal sweep. 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 Dedicated pancreatic MRI is recommended for further evaluation. 2. Cholelithiasis without cholecystitis. 3. Intra and extrahepatic biliary duct dilation without evidence of choledocholithiasis. This could be evaluated at the time of follow-up MRI as well. Electronically Signed: By: Randa Ngo M.D. On: 05/24/2020 22:51   ECHOCARDIOGRAM COMPLETE  Result Date: 05/25/2020    ECHOCARDIOGRAM REPORT   Patient Name:   JEANCLAUDE WENTWORTH Date of Exam: 05/25/2020 Medical Rec #:  [id number]     Height:       74.0 in Accession #:    [id number]    Weight:       253.5 lb Date of Birth:  1971/05/18    BSA:          2.404 m Patient Age:    2 years      BP:           106/69 mmHg Patient Gender: M             HR:           52  bpm. Exam Location:  Inpatient Procedure: 2D Echo, Cardiac Doppler and Color Doppler Indications:    Cardiomyopathy-Unspecified  History:        Patient has prior history of Echocardiogram examinations, most                 recent 06/12/2019. CHF, Arrythmias:Ventricular Fibrillation; Risk                 Factors:Dyslipidemia, Hypertension and Former Smoker. AICD.  Sonographer:    Clayton Lefort RDCS (AE) Referring Phys: Enders  1. Left ventricular ejection fraction, by estimation, is 45 to 50%. The left ventricle has mildly decreased function. The left ventricle demonstrates global hypokinesis. The left ventricular internal cavity size was mildly dilated. There is mild concentric left ventricular hypertrophy. Left ventricular diastolic parameters are consistent with Grade III diastolic dysfunction (restrictive). Elevated left ventricular end-diastolic pressure.  2. Right ventricular systolic function is normal. The right ventricular size is normal. There is normal pulmonary artery systolic pressure. The estimated right ventricular systolic pressure is 57.3 mmHg.  3. Left atrial size was moderately dilated.  4. The mitral valve is normal in  structure. Trivial mitral valve regurgitation. No evidence of mitral stenosis.  5. The aortic valve is normal in structure. Aortic valve regurgitation is not visualized. Mild aortic valve sclerosis is present, with no evidence of aortic valve stenosis.  6. The inferior vena cava is normal in size with greater than 50% respiratory variability, suggesting right atrial pressure of 3 mmHg. FINDINGS  Left Ventricle: Left ventricular ejection fraction, by estimation, is 45 to 50%. The left ventricle has mildly decreased function. The left ventricle demonstrates global hypokinesis. The left ventricular internal cavity size was mildly dilated. There is  mild concentric left ventricular hypertrophy. Left ventricular diastolic parameters are consistent with Grade III diastolic dysfunction (restrictive). Elevated left ventricular end-diastolic pressure. Right Ventricle: The right ventricular size is normal. No increase in right ventricular wall thickness. Right ventricular systolic function is normal. There is normal pulmonary artery systolic pressure. The tricuspid regurgitant velocity is 2.59 m/s, and  with an assumed right atrial pressure of 3 mmHg, the estimated right ventricular systolic pressure is 22.0 mmHg. Left Atrium: Left atrial size was moderately dilated. Right Atrium: Right atrial size was normal in size. Pericardium: There is no evidence of pericardial effusion. Mitral Valve: The mitral valve is normal in structure. Trivial mitral valve regurgitation. No evidence of mitral valve stenosis. MV peak gradient, 7.3 mmHg. The mean mitral valve gradient is 2.0 mmHg. Tricuspid Valve: The tricuspid valve is normal in structure. Tricuspid valve regurgitation is trivial. No evidence of tricuspid stenosis. Aortic Valve: The aortic valve is normal in structure. Aortic valve regurgitation is not visualized. Mild aortic valve sclerosis is present, with no evidence of aortic valve stenosis. Aortic valve mean gradient measures  7.0 mmHg. Aortic valve peak gradient measures 13.2 mmHg. Aortic valve area, by VTI measures 1.98 cm. Pulmonic Valve: The pulmonic valve was normal in structure. Pulmonic valve regurgitation is not visualized. No evidence of pulmonic stenosis. Aorta: The aortic root is normal in size and structure. Venous: The inferior vena cava is normal in size with greater than 50% respiratory variability, suggesting right atrial pressure of 3 mmHg. IAS/Shunts: No atrial level shunt detected by color flow Doppler. Additional Comments: A pacer wire is visualized.  LEFT VENTRICLE PLAX 2D LVIDd:         5.80 cm      Diastology LVIDs:  4.30 cm      LV e' medial:    6.85 cm/s LV PW:         1.40 cm      LV E/e' medial:  19.7 LV IVS:        1.30 cm      LV e' lateral:   12.40 cm/s LVOT diam:     2.10 cm      LV E/e' lateral: 10.9 LV SV:         81 LV SV Index:   34 LVOT Area:     3.46 cm  LV Volumes (MOD) LV vol d, MOD A2C: 141.0 ml LV vol d, MOD A4C: 133.0 ml LV vol s, MOD A2C: 69.6 ml LV vol s, MOD A4C: 69.7 ml LV SV MOD A2C:     71.4 ml LV SV MOD A4C:     133.0 ml LV SV MOD BP:      67.8 ml RIGHT VENTRICLE             IVC RV Basal diam:  3.20 cm     IVC diam: 1.40 cm RV S prime:     15.70 cm/s TAPSE (M-mode): 2.4 cm LEFT ATRIUM              Index       RIGHT ATRIUM           Index LA diam:        4.00 cm  1.66 cm/m  RA Area:     23.40 cm LA Vol (A2C):   95.7 ml  39.81 ml/m RA Volume:   63.70 ml  26.50 ml/m LA Vol (A4C):   102.0 ml 42.43 ml/m LA Biplane Vol: 101.0 ml 42.01 ml/m  AORTIC VALVE AV Area (Vmax):    1.73 cm AV Area (Vmean):   1.75 cm AV Area (VTI):     1.98 cm AV Vmax:           182.00 cm/s AV Vmean:          127.000 cm/s AV VTI:            0.410 m AV Peak Grad:      13.2 mmHg AV Mean Grad:      7.0 mmHg LVOT Vmax:         90.70 cm/s LVOT Vmean:        64.000 cm/s LVOT VTI:          0.234 m LVOT/AV VTI ratio: 0.57  AORTA Ao Root diam: 3.00 cm Ao Asc diam:  3.00 cm MITRAL VALVE                TRICUSPID  VALVE MV Area (PHT): 2.76 cm     TR Peak grad:   26.8 mmHg MV Peak grad:  7.3 mmHg     TR Vmax:        259.00 cm/s MV Mean grad:  2.0 mmHg MV Vmax:       1.35 m/s     SHUNTS MV Vmean:      57.0 cm/s    Systemic VTI:  0.23 m MV Decel Time: 275 msec     Systemic Diam: 2.10 cm MV E velocity: 135.00 cm/s MV A velocity: 57.50 cm/s MV E/A ratio:  2.35 Fransico Him MD Electronically signed by Fransico Him MD Signature Date/Time: 05/25/2020/2:37:18 PM    Final    Assessment/Plan:  Active Problems:   Abdominal mass  Mr. Mansa Willers is a 49 year old  male with past medical history significant for HFrEF secondary to non-ischemic dilated cardiomyopathy, recurrent V fib and V tach s/p ICD placement, HTN, HLD, and CKD2 who presented to [facility] ED on 11/13 for sudden onset periumbilical abdominal pain found to have mild growth in pancreatic/duodenal mass visible on CT from 2019 as well as transaminitis, hyperbilirubinemia with mild intra and extra-hepatic biliary ductal dilation in the setting of gallstones.  #Hyperbilirubinemia, worsening #Intra- and extra-hepatic biliary duct dilation, active #Cholelithiasis, active #Acute hepatitis, improving Patient presented with acute onset periumbilical abdominal pain found to have transaminitis, hyperbilirubinemia and radiologic evidence of cholelithiasis and intra/extrahepatic biliary duct dilation without cholecystitis. As patient was hypotensive, tachypneic, with a temperature of 100.2 and mild leukocytosis he was started on zosyn for concern of intra-abdominal infection. GI consulted and feels that patient's cholelithiasis is likely the cause of his abdominal pain rather than his pancreatic/duodenal mass. General surgery consulted for consideration of cholecystectomy. Given patient's worsening hyperbilirubinemia, general surgery may prefer patient to undergo ERCP prior to cholecystectomy. -GI and general surgery following, appreciate recommendations  -Possibly ERCP  today followed by cholecystectomy -Continue LR 75cc/hr -Continue Zosyn -daily CMP -f/u viral hepatitis panel -follow-up direct bilirubin -Continue to follow-up blood cultures, no growth to date <12 hours  #Pancreatic/duodenal mass, chronic Patient presented with acute onset periumbilical abdominal pain. CT Abdomen/Pelvis revealed a 4.7cm mass between the pancreas and duodenum which was present on prior CT renal study in 2019. 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 GI and general surgery following on providing recommendations. Options for continued workup for mass at this time include intraoperative biopsy, endoscopic ultrasound versus dedicated multiphase pancreatic CT. -GI and general surgery following, appreciate recommendations  -Intraoperative biopsy, endoscopic ultrasound, and/or dedicated multiphase pancreatic CT  -Follow-up CEA  -Follow-up CA 19-9 -NPO except for sips  #HFrEF 2/2 non-ischemic dilated cardiomyopathy, chronic #Recurrent V fib and V tach s/p ICD placement #Bradycardia, chronic Patient's prior echocardiogram in 2020 with LVEF 75-17%, grade 2 diastolic dysfunction, moderate LV dilation. Repeat echocardiogram obtained during this admission per cardiology recommendations for surgical clearance revealed LVEF of 45-50% with global hypokinesis and grade 3 diastolic dysfunction. VF and VT have been well controlled with amiodarone 200mg  recently reduced from 400mg . Cardiology consulted and reports patient to be "completely stable from a cardiac standpoint." -Continue home ASA 81mg  -Continue home amiodarone 200mg  -Holding home Entresto 24-26mg  BID and bisoprolol 10mg  daily for hypotension on admission -Telemetry  #Essential HTN, chronic Patient's blood pressure improved to 001V/494W systolic following  aggressive fluid resuscitation on admission and holding of home bisoprolol and Etresto. -Continue holding home bisoprolol and Entresto  #CKD II, chronic Patient's baseline creatinine of 1.3, currently 1.41. -Monitor creatinine on daily CMP -avoid nephrotoxins  #Asthma, chronic -Continue home symbicort  #Diet: NPO #VTE ppx: SCDs #Code status: Full code #IVF: 75cc/hr  Cato Mulligan, MD 05/26/2020, 6:45 AM Pager: [phone] After 5pm on weekdays and 1pm on weekends: On Call pager [phone]

## 2020-05-26 NOTE — Progress Notes (Signed)
Progress Note     Subjective: Patient denies pain or nausea this AM. Has not noted any yellowing of sclera but has not specifically been looking. He denies known family hx of pancreatic or colon cancers.   Objective: Vital signs in last 24 hours: Temp:  [98.4 F (36.9 C)-98.8 F (37.1 C)] 98.6 F (37 C) (11/15 0509) Pulse Rate:  [48-60] 60 (11/15 0734) Resp:  [16-28] 18 (11/15 0734) BP: (92-124)/(48-73) 112/72 (11/15 0509) SpO2:  [95 %-100 %] 99 % (11/15 0734) Last BM Date: 05/24/20  Intake/Output from previous day: 11/14 0701 - 11/15 0700 In: 1120.8 [IV Piggyback:1120.8] Out: 1400 [Urine:1400] Intake/Output this shift: Total I/O In: -  Out: 500 [Urine:500]  PE: General: pleasant, WD, overweight male who is laying in bed in NAD HEENT:  Sclera are anicteric.  PERRL.  Ears and nose without any masses or lesions.  Mouth is pink and moist Heart: regular, rate, and rhythm.  Normal s1,s2. No obvious murmurs, gallops, or rubs noted.  Palpable radial and pedal pulses bilaterally Lungs: CTAB, no wheezes, rhonchi, or rales noted.  Respiratory effort nonlabored Abd: soft, NT, ND, +BS, no masses, hernias, or organomegaly MS: all 4 extremities are symmetrical with no cyanosis, clubbing, or edema. Skin: warm and dry with no masses, lesions, or rashes Neuro: Cranial nerves 2-12 grossly intact, sensation is normal throughout Psych: A&Ox3 with an appropriate affect.    Lab Results:  Recent Labs    05/25/20 0336 05/26/20 0334  WBC 11.2* 5.8  HGB 16.0 15.5  HCT 48.0 47.4  PLT 220 140*   BMET Recent Labs    05/25/20 0336 05/26/20 0334  NA 138 140  K 4.0 3.9  CL 106 107  CO2 24 26  GLUCOSE 119* 103*  BUN 19 11  CREATININE 1.50* 1.41*  CALCIUM 8.5* 8.6*   PT/INR Recent Labs    05/25/20 0336  LABPROT 15.3*  INR 1.3*   CMP     Component Value Date/Time   NA 140 05/26/2020 0334   NA 144 12/03/2019 1703   K 3.9 05/26/2020 0334   CL 107 05/26/2020 0334   CO2 26  05/26/2020 0334   GLUCOSE 103 (H) 05/26/2020 0334   BUN 11 05/26/2020 0334   BUN 15 12/03/2019 1703   CREATININE 1.41 (H) 05/26/2020 0334   CREATININE 1.16 03/01/2016 1607   CALCIUM 8.6 (L) 05/26/2020 0334   PROT 5.4 (L) 05/26/2020 0334   PROT 6.6 05/18/2019 1607   ALBUMIN 2.3 (L) 05/26/2020 0334   ALBUMIN 4.2 05/18/2019 1607   AST 58 (H) 05/26/2020 0334   ALT 148 (H) 05/26/2020 0334   ALKPHOS 67 05/26/2020 0334   BILITOT 5.4 (H) 05/26/2020 0334   BILITOT 0.5 05/18/2019 1607   GFRNONAA >60 05/26/2020 0334   GFRNONAA >89 09/22/2015 1008   GFRAA 77 12/03/2019 1703   GFRAA >89 09/22/2015 1008   Lipase     Component Value Date/Time   LIPASE 31 05/24/2020 1956       Studies/Results: CT ABDOMEN PELVIS W CONTRAST  Addendum Date: 05/25/2020   ADDENDUM REPORT: 05/25/2020 17:21 ADDENDUM: Case was discussed with Dr. Redmond Pulling. There is an indwelling AICD, precluding evaluation with MRI. Given the findings within the gallbladder and common bile duct, endoscopy may be useful for further evaluation. This could allow for evaluation of the biliary system as well as possible EUS/biopsy for further characterization of the mass. If desired, dedicated multiphase pancreatic CT may add some additional information. Electronically Signed   By:  Randa Ngo M.D.   On: 05/25/2020 17:21   Result Date: 05/25/2020 CLINICAL DATA:  Abdominal pain, fever, nausea EXAM: CT ABDOMEN AND PELVIS WITH CONTRAST TECHNIQUE: Multidetector CT imaging of the abdomen and pelvis was performed using the standard protocol following bolus administration of intravenous contrast. CONTRAST:  15mL OMNIPAQUE IOHEXOL 300 MG/ML  SOLN COMPARISON:  01/21/2018 FINDINGS: Lower chest: No acute pleural or parenchymal lung disease. Hepatobiliary: Calcified gallstones are identified. No gallbladder wall thickening or pericholecystic fluid. No evidence of choledocholithiasis. There is mild intrahepatic and extrahepatic biliary dilation, with  common bile duct measuring 7 mm. No focal liver parenchymal abnormalities. Pancreas: There is a 4.7 x 4.4 x 4.5 cm solid mass between the uncinate process of the pancreas and the duodenal sweep. Mass demonstrates heterogeneous attenuation, likely representing enhancement. In comparison to 2019, this mass has demonstrated little interval growth measuring up to 4.6 x 4.2 cm previously. It is unclear whether the mass arises from the pancreas or the duodenal wall, and dedicated pancreatic MRI is recommended for further evaluation. The remainder of the pancreas is normal. Spleen: Normal in size without focal abnormality. Adrenals/Urinary Tract: Chronic right renal atrophy. Otherwise the kidneys enhance normally. The adrenals are unremarkable. Bladder is grossly normal. Stomach/Bowel: No bowel obstruction or ileus. Normal appendix right lower quadrant. No bowel wall thickening or inflammatory change. Vascular/Lymphatic: No significant vascular findings are present. No enlarged abdominal or pelvic lymph nodes. Reproductive: Prostate is unremarkable. Other: No free fluid or free gas.  No abdominal wall hernia. Musculoskeletal: No acute or destructive bony lesions. Reconstructed images demonstrate no additional findings. IMPRESSION: 1. 4.7 cm solid mass interposed between the uncinate process of the pancreas and duodenal sweep. 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 Dedicated pancreatic MRI is recommended for further evaluation. 2. Cholelithiasis without cholecystitis. 3. Intra and extrahepatic biliary duct dilation without evidence of choledocholithiasis. This could be evaluated at the time of follow-up MRI as well. Electronically Signed: By: Randa Ngo M.D. On: 05/24/2020 22:51   ECHOCARDIOGRAM COMPLETE  Result Date: 05/25/2020     ECHOCARDIOGRAM REPORT   Patient Name:   [name] Date of Exam: 05/25/2020 Medical Rec #:  [id number]     Height:       74.0 in Accession #:    [id number]    Weight:       253.5 lb Date of Birth:  Dec 03, 1970    BSA:          2.404 m Patient Age:    68 years      BP:           106/69 mmHg Patient Gender: M             HR:           52 bpm. Exam Location:  Inpatient Procedure: 2D Echo, Cardiac Doppler and Color Doppler Indications:    Cardiomyopathy-Unspecified  History:        Patient has prior history of Echocardiogram examinations, most                 recent 06/12/2019. CHF, Arrythmias:Ventricular Fibrillation; Risk                 Factors:Dyslipidemia, Hypertension and Former Smoker. AICD.  Sonographer:    Clayton Lefort RDCS (AE) Referring Phys: [place]  1. Left ventricular  ejection fraction, by estimation, is 45 to 50%. The left ventricle has mildly decreased function. The left ventricle demonstrates global hypokinesis. The left ventricular internal cavity size was mildly dilated. There is mild concentric left ventricular hypertrophy. Left ventricular diastolic parameters are consistent with Grade III diastolic dysfunction (restrictive). Elevated left ventricular end-diastolic pressure.  2. Right ventricular systolic function is normal. The right ventricular size is normal. There is normal pulmonary artery systolic pressure. The estimated right ventricular systolic pressure is 14.4 mmHg.  3. Left atrial size was moderately dilated.  4. The mitral valve is normal in structure. Trivial mitral valve regurgitation. No evidence of mitral stenosis.  5. The aortic valve is normal in structure. Aortic valve regurgitation is not visualized. Mild aortic valve sclerosis is present, with no evidence of aortic valve stenosis.  6. The inferior vena cava is normal in size with greater than 50% respiratory variability, suggesting right atrial pressure of 3 mmHg. FINDINGS  Left Ventricle: Left ventricular  ejection fraction, by estimation, is 45 to 50%. The left ventricle has mildly decreased function. The left ventricle demonstrates global hypokinesis. The left ventricular internal cavity size was mildly dilated. There is  mild concentric left ventricular hypertrophy. Left ventricular diastolic parameters are consistent with Grade III diastolic dysfunction (restrictive). Elevated left ventricular end-diastolic pressure. Right Ventricle: The right ventricular size is normal. No increase in right ventricular wall thickness. Right ventricular systolic function is normal. There is normal pulmonary artery systolic pressure. The tricuspid regurgitant velocity is 2.59 m/s, and  with an assumed right atrial pressure of 3 mmHg, the estimated right ventricular systolic pressure is 31.5 mmHg. Left Atrium: Left atrial size was moderately dilated. Right Atrium: Right atrial size was normal in size. Pericardium: There is no evidence of pericardial effusion. Mitral Valve: The mitral valve is normal in structure. Trivial mitral valve regurgitation. No evidence of mitral valve stenosis. MV peak gradient, 7.3 mmHg. The mean mitral valve gradient is 2.0 mmHg. Tricuspid Valve: The tricuspid valve is normal in structure. Tricuspid valve regurgitation is trivial. No evidence of tricuspid stenosis. Aortic Valve: The aortic valve is normal in structure. Aortic valve regurgitation is not visualized. Mild aortic valve sclerosis is present, with no evidence of aortic valve stenosis. Aortic valve mean gradient measures 7.0 mmHg. Aortic valve peak gradient measures 13.2 mmHg. Aortic valve area, by VTI measures 1.98 cm. Pulmonic Valve: The pulmonic valve was normal in structure. Pulmonic valve regurgitation is not visualized. No evidence of pulmonic stenosis. Aorta: The aortic root is normal in size and structure. Venous: The inferior vena cava is normal in size with greater than 50% respiratory variability, suggesting right atrial pressure of 3  mmHg. IAS/Shunts: No atrial level shunt detected by color flow Doppler. Additional Comments: A pacer wire is visualized.  LEFT VENTRICLE PLAX 2D LVIDd:         5.80 cm      Diastology LVIDs:         4.30 cm      LV e' medial:    6.85 cm/s LV PW:         1.40 cm      LV E/e' medial:  19.7 LV IVS:        1.30 cm      LV e' lateral:   12.40 cm/s LVOT diam:     2.10 cm      LV E/e' lateral: 10.9 LV SV:         81 LV SV Index:   34 LVOT  Area:     3.46 cm  LV Volumes (MOD) LV vol d, MOD A2C: 141.0 ml LV vol d, MOD A4C: 133.0 ml LV vol s, MOD A2C: 69.6 ml LV vol s, MOD A4C: 69.7 ml LV SV MOD A2C:     71.4 ml LV SV MOD A4C:     133.0 ml LV SV MOD BP:      67.8 ml RIGHT VENTRICLE             IVC RV Basal diam:  3.20 cm     IVC diam: 1.40 cm RV S prime:     15.70 cm/s TAPSE (M-mode): 2.4 cm LEFT ATRIUM              Index       RIGHT ATRIUM           Index LA diam:        4.00 cm  1.66 cm/m  RA Area:     23.40 cm LA Vol (A2C):   95.7 ml  39.81 ml/m RA Volume:   63.70 ml  26.50 ml/m LA Vol (A4C):   102.0 ml 42.43 ml/m LA Biplane Vol: 101.0 ml 42.01 ml/m  AORTIC VALVE AV Area (Vmax):    1.73 cm AV Area (Vmean):   1.75 cm AV Area (VTI):     1.98 cm AV Vmax:           182.00 cm/s AV Vmean:          127.000 cm/s AV VTI:            0.410 m AV Peak Grad:      13.2 mmHg AV Mean Grad:      7.0 mmHg LVOT Vmax:         90.70 cm/s LVOT Vmean:        64.000 cm/s LVOT VTI:          0.234 m LVOT/AV VTI ratio: 0.57  AORTA Ao Root diam: 3.00 cm Ao Asc diam:  3.00 cm MITRAL VALVE                TRICUSPID VALVE MV Area (PHT): 2.76 cm     TR Peak grad:   26.8 mmHg MV Peak grad:  7.3 mmHg     TR Vmax:        259.00 cm/s MV Mean grad:  2.0 mmHg MV Vmax:       1.35 m/s     SHUNTS MV Vmean:      57.0 cm/s    Systemic VTI:  0.23 m MV Decel Time: 275 msec     Systemic Diam: 2.10 cm MV E velocity: 135.00 cm/s MV A velocity: 57.50 cm/s MV E/A ratio:  2.35 Fransico Him MD Electronically signed by Fransico Him MD Signature Date/Time:  05/25/2020/2:37:18 PM    Final     Anti-infectives: Anti-infectives (From admission, onward)   Start     Dose/Rate Route Frequency Ordered Stop   05/25/20 0200  piperacillin-tazobactam (ZOSYN) IVPB 3.375 g        3.375 g 12.5 mL/hr over 240 Minutes Intravenous Every 8 hours 05/25/20 0043     05/24/20 2030  piperacillin-tazobactam (ZOSYN) IVPB 3.375 g        3.375 g 12.5 mL/hr over 240 Minutes Intravenous  Once 05/24/20 2029 05/24/20 2138       Assessment/Plan HFrEF secondary to NIDCM Recurrent VF/VT s/p AICD HTN HLD CKD stage II  Duodenal vs pancreatic mass Biliary dilation with elevated  LFTs and Tbili - AST/ALT improved today, however Tbili now up to 5.4  - agree that slow growth less indicative of adenocarcinoma but etiology of mass needs to be considered prior to operative management  - acute cholecystitis unlikely given no RUQ pain  - if Tbili elevated secondary to choledocholithiasis ERCP prior to lap chole would still be the standard of care for any Tbili >4.0  - patient unable to undergo MRCP due to AICD - Recommend ERCP for ductal interrogation, possible clearance, and possible stenting if indicated given clinical evidence of biliary obstruction  - Recommend EUS to further characterize the mass with biopsy for pathologic diagnosis - have also ordered CA 19-9 and CEA as this does not appear to have been checked - no indication for emergent surgical intervention at this time   FEN: NPO, IVF VTE: SCDs ID: [email or web address] 11/14>>   LOS: 1 day    Norm Parcel , [facility] 05/26/2020, 10:11 AM Please see Amion for pager number during day hours 7:00am-4:30pm

## 2020-05-27 ENCOUNTER — Inpatient Hospital Stay (HOSPITAL_COMMUNITY): Payer: Managed Care, Other (non HMO) | Admitting: Anesthesiology

## 2020-05-27 ENCOUNTER — Encounter (HOSPITAL_COMMUNITY): Payer: Self-pay | Admitting: Internal Medicine

## 2020-05-27 ENCOUNTER — Encounter (HOSPITAL_COMMUNITY)
Admission: EM | Disposition: A | Discharge status: Home / Self Care | Payer: Self-pay | Source: Home / Self Care | Attending: Internal Medicine

## 2020-05-27 ENCOUNTER — Inpatient Hospital Stay (HOSPITAL_COMMUNITY): Payer: Managed Care, Other (non HMO)

## 2020-05-27 DIAGNOSIS — R1906 Epigastric swelling, mass or lump: Secondary | ICD-10-CM

## 2020-05-27 DIAGNOSIS — K8020 Calculus of gallbladder without cholecystitis without obstruction: Secondary | ICD-10-CM | POA: Diagnosis present

## 2020-05-27 HISTORY — PX: SPHINCTEROTOMY: SHX5544

## 2020-05-27 HISTORY — PX: REMOVAL OF STONES: SHX5545

## 2020-05-27 HISTORY — PX: ERCP: SHX5425

## 2020-05-27 LAB — CBC
HCT: 46 % (ref 39.0–52.0)
Hemoglobin: 15.2 g/dL (ref 13.0–17.0)
MCH: 28.2 pg (ref 26.0–34.0)
MCHC: 33 g/dL (ref 30.0–36.0)
MCV: 85.3 fL (ref 80.0–100.0)
Platelets: 132 10*3/uL — ABNORMAL LOW (ref 150–400)
RBC: 5.39 MIL/uL (ref 4.22–5.81)
RDW: 14.1 % (ref 11.5–15.5)
WBC: 4.8 10*3/uL (ref 4.0–10.5)
nRBC: 0 % (ref 0.0–0.2)

## 2020-05-27 LAB — COMPREHENSIVE METABOLIC PANEL
ALT: 104 U/L — ABNORMAL HIGH (ref 0–44)
AST: 42 U/L — ABNORMAL HIGH (ref 15–41)
Albumin: 2.2 g/dL — ABNORMAL LOW (ref 3.5–5.0)
Alkaline Phosphatase: 73 U/L (ref 38–126)
Anion gap: 10 (ref 5–15)
BUN: 10 mg/dL (ref 6–20)
CO2: 22 mmol/L (ref 22–32)
Calcium: 8.5 mg/dL — ABNORMAL LOW (ref 8.9–10.3)
Chloride: 106 mmol/L (ref 98–111)
Creatinine, Ser: 1.39 mg/dL — ABNORMAL HIGH (ref 0.61–1.24)
GFR, Estimated: >60 mL/min (ref >60)
Glucose, Bld: 94 mg/dL (ref 70–99)
Potassium: 3.9 mmol/L (ref 3.5–5.1)
Sodium: 138 mmol/L (ref 135–145)
Total Bilirubin: 3.9 mg/dL — ABNORMAL HIGH (ref 0.3–1.2)
Total Protein: 5.7 g/dL — ABNORMAL LOW (ref 6.5–8.1)

## 2020-05-27 LAB — HEPATITIS PANEL, ACUTE
HCV Ab: 0.1 s/co ratio — AB (ref 0.0–0.9)
Hep A IgM: NEGATIVE — AB
Hep B C IgM: NEGATIVE — AB
Hepatitis B Surface Ag: NEGATIVE — AB

## 2020-05-27 LAB — CEA: CEA: 2.2 ng/mL (ref 0.0–4.7)

## 2020-05-27 LAB — CANCER ANTIGEN 19-9: CA 19-9: 83 U/mL — ABNORMAL HIGH (ref 0–35)

## 2020-05-27 SURGERY — ERCP, WITH INTERVENTION IF INDICATED
Anesthesia: General

## 2020-05-27 MED ORDER — INDOMETHACIN 50 MG RE SUPP
RECTAL | Status: AC
  Filled 2020-05-27: qty 2

## 2020-05-27 MED ORDER — PROPOFOL 10 MG/ML IV BOLUS
INTRAVENOUS | Status: DC | PRN
  Administered 2020-05-27: 200 mg via INTRAVENOUS

## 2020-05-27 MED ORDER — FENTANYL CITRATE (PF) 250 MCG/5ML IJ SOLN
INTRAMUSCULAR | Status: DC | PRN
  Administered 2020-05-27 (×2): 50 ug via INTRAVENOUS

## 2020-05-27 MED ORDER — EPHEDRINE SULFATE 50 MG/ML IJ SOLN
INTRAMUSCULAR | Status: DC | PRN
  Administered 2020-05-27: 5 mg via INTRAVENOUS

## 2020-05-27 MED ORDER — LIDOCAINE 2% (20 MG/ML) 5 ML SYRINGE
INTRAMUSCULAR | Status: DC | PRN
  Administered 2020-05-27: 100 mg via INTRAVENOUS

## 2020-05-27 MED ORDER — GLUCAGON HCL RDNA (DIAGNOSTIC) 1 MG IJ SOLR
INTRAMUSCULAR | Status: AC
  Filled 2020-05-27: qty 1

## 2020-05-27 MED ORDER — SODIUM CHLORIDE 0.9 % IV SOLN
INTRAVENOUS | Status: DC | PRN
  Administered 2020-05-27: 15 mL

## 2020-05-27 MED ORDER — ONDANSETRON HCL 4 MG/2ML IJ SOLN
INTRAMUSCULAR | Status: DC | PRN
  Administered 2020-05-27: 4 mg via INTRAVENOUS

## 2020-05-27 MED ORDER — ROCURONIUM BROMIDE 10 MG/ML (PF) SYRINGE
PREFILLED_SYRINGE | INTRAVENOUS | Status: DC | PRN
  Administered 2020-05-27: 60 mg via INTRAVENOUS

## 2020-05-27 MED ORDER — SUGAMMADEX SODIUM 200 MG/2ML IV SOLN
INTRAVENOUS | Status: DC | PRN
  Administered 2020-05-27: 300 mg via INTRAVENOUS

## 2020-05-27 MED ORDER — MIDAZOLAM HCL 5 MG/5ML IJ SOLN
INTRAMUSCULAR | Status: DC | PRN
  Administered 2020-05-27: 2 mg via INTRAVENOUS

## 2020-05-27 MED ORDER — DEXAMETHASONE SODIUM PHOSPHATE 10 MG/ML IJ SOLN
INTRAMUSCULAR | Status: DC | PRN
  Administered 2020-05-27: 10 mg via INTRAVENOUS

## 2020-05-27 MED ORDER — INDOMETHACIN 50 MG RE SUPP
RECTAL | Status: DC | PRN
  Administered 2020-05-27: 100 mg via RECTAL

## 2020-05-27 MED ORDER — GLYCOPYRROLATE 0.2 MG/ML IJ SOLN
INTRAMUSCULAR | Status: DC | PRN
  Administered 2020-05-27: .2 mg via INTRAVENOUS

## 2020-05-27 NOTE — Progress Notes (Signed)
Subjective:  Mr. Artyom Stencel is a 49 year old male with past medical history significant for HFrEF 2/2 non-ischemic dilated cardiomyopathy, recurrent V fib and V tach s/p ICD placement, HTN, HLD, and CKD2 who presented to [facility] ED on 11/13 for sudden onset periumbilical abdominal pain found to have mild growth in pancreaticoduodenal mass visible on CT from 2019 as well as transaminitis, hyperbilirubinemia with mild intra and extra-hepatic biliary ductal dilation in the setting of gallstones.  Overnight, no acute events.  This morning, patient endorses early satiety with his meals yesterday. Additionally, patient endorses frequent flatulence. His last bowel movement was two days prior. He states that he continues to experience mild, intermittent, bilateral knee and foot pain improved with Tylenol. He would like to have this condition further worked up in the outpatient setting. Otherwise, he reports that he is looking forward to undergoing ERCP today and has no further questions or concerns.  Objective:  Vital signs in last 24 hours: Vitals:   05/26/20 0734 05/26/20 1345 05/26/20 1929 05/27/20 0400  BP:  114/61 131/73 115/82  Pulse: 60 (!) 51 60 (!) 50  Resp: _0 Temp:  98.2 F (36.8 C) 98.3 F (36.8 C) 98.3 F (36.8 C)  TempSrc:   Oral Oral  SpO2: 99% 98% 100% 99%  Weight:      Height:      SpO2: 99 %  Intake/Output Summary (Last 24 hours) at 05/27/2020 1157 Last data filed at 05/27/2020 0556 Gross per 24 hour  Intake 3121.42 ml  Output 850 ml  Net 2271.42 ml   Filed Weights   05/24/20 1952  Weight: 115 kg  Physical Exam Constitutional:      Appearance: He is well-developed.  HENT:     Head: Normocephalic and atraumatic.  Cardiovascular:     Rate and Rhythm: Regular rhythm. Bradycardia present.     Heart sounds: Normal heart sounds.  Pulmonary:     Effort: Pulmonary effort is normal. No respiratory distress.     Breath sounds: Normal breath sounds.   Abdominal:     General: Abdomen is flat. Bowel sounds are normal. There is no distension.     Palpations: Abdomen is soft.     Tenderness: There is abdominal tenderness in the epigastric area.     Comments: Mild epigastric tenderness to deep palpation  Skin:    General: Skin is warm and dry.     Capillary Refill: Capillary refill takes less than 2 seconds.  Neurological:     General: No focal deficit present.     Mental Status: He is alert and oriented to person, place, and time.  Psychiatric:        Mood and Affect: Mood normal.        Behavior: Behavior normal.    CBC Latest Ref Rng & Units 05/27/2020 05/26/2020 05/25/2020  WBC 4.0 - 10.5 K/uL 4.8 5.8 11.2(H)  Hemoglobin 13.0 - 17.0 g/dL 15.2 15.5 16.0  Hematocrit 39 - 52 % 46.0 47.4 48.0  Platelets 150 - 400 K/uL 132(L) 140(L) 220   CMP Latest Ref Rng & Units 05/27/2020 05/26/2020 05/25/2020  Glucose 70 - 99 mg/dL 94 103(H) 119(H)  BUN 6 - 20 mg/dL _1 Creatinine 0.61 - 1.24 mg/dL 1.39(H) 1.41(H) 1.50(H)  Sodium 135 - 145 mmol/L 138 140 138  Potassium 3.5 - 5.1 mmol/L 3.9 3.9 4.0  Chloride 98 - 111 mmol/L 106 107 106  CO2 22 - 32 mmol/L 22 26 24  Calcium 8.9 - 10.3 mg/dL 8.5(L) 8.6(L) 8.5(L)  Total Protein 6.5 - 8.1 g/dL 5.7(L) 5.4(L) 5.6(L)  Total Bilirubin 0.3 - 1.2 mg/dL 3.9(H) 5.4(H) 4.6(H)  Alkaline Phos 38 - 126 U/L 73 67 67  AST 15 - 41 U/L 42(H) 58(H) 175(H)  ALT 0 - 44 U/L 104(H) 148(H) 228(H)   CEA - 2.2 CA 19-9 - 83  CT ABDOMEN PELVIS W CONTRAST  Addendum Date: 05/25/2020   ADDENDUM REPORT: 05/25/2020 17:21 ADDENDUM: Case was discussed with Dr. Redmond Pulling. There is an indwelling AICD, precluding evaluation with MRI. Given the findings within the gallbladder and common bile duct, endoscopy may be useful for further evaluation. This could allow for evaluation of the biliary system as well as possible EUS/biopsy for further characterization of the mass. If desired, dedicated multiphase pancreatic CT may add  some additional information. Electronically Signed   By: Randa Ngo M.D.   On: 05/25/2020 17:21   Result Date: 05/25/2020 CLINICAL DATA:  Abdominal pain, fever, nausea EXAM: CT ABDOMEN AND PELVIS WITH CONTRAST TECHNIQUE: Multidetector CT imaging of the abdomen and pelvis was performed using the standard protocol following bolus administration of intravenous contrast. CONTRAST:  140mL OMNIPAQUE IOHEXOL 300 MG/ML  SOLN COMPARISON:  01/21/2018 FINDINGS: Lower chest: No acute pleural or parenchymal lung disease. Hepatobiliary: Calcified gallstones are identified. No gallbladder wall thickening or pericholecystic fluid. No evidence of choledocholithiasis. There is mild intrahepatic and extrahepatic biliary dilation, with common bile duct measuring 7 mm. No focal liver parenchymal abnormalities. Pancreas: There is a 4.7 x 4.4 x 4.5 cm solid mass between the uncinate process of the pancreas and the duodenal sweep. Mass demonstrates heterogeneous attenuation, likely representing enhancement. In comparison to 2019, this mass has demonstrated little interval growth measuring up to 4.6 x 4.2 cm previously. It is unclear whether the mass arises from the pancreas or the duodenal wall, and dedicated pancreatic MRI is recommended for further evaluation. The remainder of the pancreas is normal. Spleen: Normal in size without focal abnormality. Adrenals/Urinary Tract: Chronic right renal atrophy. Otherwise the kidneys enhance normally. The adrenals are unremarkable. Bladder is grossly normal. Stomach/Bowel: No bowel obstruction or ileus. Normal appendix right lower quadrant. No bowel wall thickening or inflammatory change. Vascular/Lymphatic: No significant vascular findings are present. No enlarged abdominal or pelvic lymph nodes. Reproductive: Prostate is unremarkable. Other: No free fluid or free gas.  No abdominal wall hernia. Musculoskeletal: No acute or destructive bony lesions. Reconstructed images demonstrate no  additional findings. IMPRESSION: 1. 4.7 cm solid mass interposed between the uncinate process of the pancreas and duodenal sweep. 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 Dedicated pancreatic MRI is recommended for further evaluation. 2. Cholelithiasis without cholecystitis. 3. Intra and extrahepatic biliary duct dilation without evidence of choledocholithiasis. This could be evaluated at the time of follow-up MRI as well. Electronically Signed: By: Randa Ngo M.D. On: 05/24/2020 22:51   ECHOCARDIOGRAM COMPLETE  Result Date: 05/25/2020    ECHOCARDIOGRAM REPORT   Patient Name:   LORAIN KEAST Date of Exam: 05/25/2020 Medical Rec #:  [id number]     Height:       74.0 in Accession #:    [id number]    Weight:       253.5 lb Date of Birth:  23-Mar-1971    BSA:          2.404 m Patient  Age:    12 years      BP:           106/69 mmHg Patient Gender: M             HR:           52 bpm. Exam Location:  Inpatient Procedure: 2D Echo, Cardiac Doppler and Color Doppler Indications:    Cardiomyopathy-Unspecified  History:        Patient has prior history of Echocardiogram examinations, most                 recent 06/12/2019. CHF, Arrythmias:Ventricular Fibrillation; Risk                 Factors:Dyslipidemia, Hypertension and Former Smoker. AICD.  Sonographer:    Clayton Lefort RDCS (AE) Referring Phys: [place]  1. Left ventricular ejection fraction, by estimation, is 45 to 50%. The left ventricle has mildly decreased function. The left ventricle demonstrates global hypokinesis. The left ventricular internal cavity size was mildly dilated. There is mild concentric left ventricular hypertrophy. Left ventricular diastolic parameters are consistent with Grade III diastolic dysfunction (restrictive). Elevated left ventricular  end-diastolic pressure.  2. Right ventricular systolic function is normal. The right ventricular size is normal. There is normal pulmonary artery systolic pressure. The estimated right ventricular systolic pressure is 31.5 mmHg.  3. Left atrial size was moderately dilated.  4. The mitral valve is normal in structure. Trivial mitral valve regurgitation. No evidence of mitral stenosis.  5. The aortic valve is normal in structure. Aortic valve regurgitation is not visualized. Mild aortic valve sclerosis is present, with no evidence of aortic valve stenosis.  6. The inferior vena cava is normal in size with greater than 50% respiratory variability, suggesting right atrial pressure of 3 mmHg. FINDINGS  Left Ventricle: Left ventricular ejection fraction, by estimation, is 45 to 50%. The left ventricle has mildly decreased function. The left ventricle demonstrates global hypokinesis. The left ventricular internal cavity size was mildly dilated. There is  mild concentric left ventricular hypertrophy. Left ventricular diastolic parameters are consistent with Grade III diastolic dysfunction (restrictive). Elevated left ventricular end-diastolic pressure. Right Ventricle: The right ventricular size is normal. No increase in right ventricular wall thickness. Right ventricular systolic function is normal. There is normal pulmonary artery systolic pressure. The tricuspid regurgitant velocity is 2.59 m/s, and  with an assumed right atrial pressure of 3 mmHg, the estimated right ventricular systolic pressure is 17.6 mmHg. Left Atrium: Left atrial size was moderately dilated. Right Atrium: Right atrial size was normal in size. Pericardium: There is no evidence of pericardial effusion. Mitral Valve: The mitral valve is normal in structure. Trivial mitral valve regurgitation. No evidence of mitral valve stenosis. MV peak gradient, 7.3 mmHg. The mean mitral valve gradient is 2.0 mmHg. Tricuspid Valve: The tricuspid valve is normal in  structure. Tricuspid valve regurgitation is trivial. No evidence of tricuspid stenosis. Aortic Valve: The aortic valve is normal in structure. Aortic valve regurgitation is not visualized. Mild aortic valve sclerosis is present, with no evidence of aortic valve stenosis. Aortic valve mean gradient measures 7.0 mmHg. Aortic valve peak gradient measures 13.2 mmHg. Aortic valve area, by VTI measures 1.98 cm. Pulmonic Valve: The pulmonic valve was normal in structure. Pulmonic valve regurgitation is not visualized. No evidence of pulmonic stenosis. Aorta: The aortic root is normal in size and structure. Venous: The inferior vena cava is normal in size with greater than 50% respiratory variability,  suggesting right atrial pressure of 3 mmHg. IAS/Shunts: No atrial level shunt detected by color flow Doppler. Additional Comments: A pacer wire is visualized.  LEFT VENTRICLE PLAX 2D LVIDd:         5.80 cm      Diastology LVIDs:         4.30 cm      LV e' medial:    6.85 cm/s LV PW:         1.40 cm      LV E/e' medial:  19.7 LV IVS:        1.30 cm      LV e' lateral:   12.40 cm/s LVOT diam:     2.10 cm      LV E/e' lateral: 10.9 LV SV:         81 LV SV Index:   34 LVOT Area:     3.46 cm  LV Volumes (MOD) LV vol d, MOD A2C: 141.0 ml LV vol d, MOD A4C: 133.0 ml LV vol s, MOD A2C: 69.6 ml LV vol s, MOD A4C: 69.7 ml LV SV MOD A2C:     71.4 ml LV SV MOD A4C:     133.0 ml LV SV MOD BP:      67.8 ml RIGHT VENTRICLE             IVC RV Basal diam:  3.20 cm     IVC diam: 1.40 cm RV S prime:     15.70 cm/s TAPSE (M-mode): 2.4 cm LEFT ATRIUM              Index       RIGHT ATRIUM           Index LA diam:        4.00 cm  1.66 cm/m  RA Area:     23.40 cm LA Vol (A2C):   95.7 ml  39.81 ml/m RA Volume:   63.70 ml  26.50 ml/m LA Vol (A4C):   102.0 ml 42.43 ml/m LA Biplane Vol: 101.0 ml 42.01 ml/m  AORTIC VALVE AV Area (Vmax):    1.73 cm AV Area (Vmean):   1.75 cm AV Area (VTI):     1.98 cm AV Vmax:           182.00 cm/s AV Vmean:           127.000 cm/s AV VTI:            0.410 m AV Peak Grad:      13.2 mmHg AV Mean Grad:      7.0 mmHg LVOT Vmax:         90.70 cm/s LVOT Vmean:        64.000 cm/s LVOT VTI:          0.234 m LVOT/AV VTI ratio: 0.57  AORTA Ao Root diam: 3.00 cm Ao Asc diam:  3.00 cm MITRAL VALVE                TRICUSPID VALVE MV Area (PHT): 2.76 cm     TR Peak grad:   26.8 mmHg MV Peak grad:  7.3 mmHg     TR Vmax:        259.00 cm/s MV Mean grad:  2.0 mmHg MV Vmax:       1.35 m/s     SHUNTS MV Vmean:      57.0 cm/s    Systemic VTI:  0.23 m MV Decel Time: 275 msec  Systemic Diam: 2.10 cm MV E velocity: 135.00 cm/s MV A velocity: 57.50 cm/s MV E/A ratio:  2.35 Fransico Him MD Electronically signed by Fransico Him MD Signature Date/Time: 05/25/2020/2:37:18 PM    Final    Assessment/Plan:  Active Problems:   Abdominal mass  Mr. Zacchaeus Halm is a 49 year old male with past medical history significant for HFrEF 2/2 non-ischemic dilated cardiomyopathy, recurrent V fib and V tach s/p ICD placement, HTN, HLD, and CKD2 who presented to [facility] ED on 11/13 for sudden onset periumbilical abdominal pain found to have mild growth in pancreaticoduodenal mass visible on CT from 2019 as well as transaminitis, hyperbilirubinemia with mild intra and extra-hepatic biliary ductal dilation in the setting of gallstones.  #Intra- and extra-hepatic biliary duct dilation, active #Cholelithiasis, active #Hyperbilirubinemia, improving #Acute hepatitis, improving Patient presented with acute onset periumbilical abdominal pain found to have transaminitis, hyperbilirubinemia and radiologic evidence of cholelithiasis and intra/extrahepatic biliary duct dilation without cholecystitis. As patient was hypotensive, tachypneic, with a temperature of 100.2 and mild leukocytosis he was started on zosyn for concern of associated intra-abdominal infection. GI consulted and feels that patient's cholelithiasis is likely the cause of his abdominal pain  rather than his pancreatic/duodenal mass given his overall presentation and review of images. General surgery consulted and providing recommendations. Given patient's persistent hyperbilirubinemia, patient has been recommended to undergo ERCP today at [facility] with subsequent surgical intervention recommendations to follow. AST and ALT improving with stable Alk Phos. -GI and general surgery following, appreciate recommendations  -ERCP today at [facility] for ductal interrogation, possible clearance, and possible stenting -Continue LR 75cc/hr -Continue Zosyn -daily CMP -daily CBC -Continue to follow-up blood cultures, no growth to date  #Pancreaticoduodenal mass, chronic Patient presented with acute onset periumbilical abdominal pain. CT Abdomen/Pelvis revealed a 4.7cm mass between the pancreas and duodenum which was present on prior CT renal study in 2019. 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 GI and general surgery providing recommendations. CA-19-9 elevated, although CEA is within normal limits. Suggested options for continued workup for mass at this time include intraoperative biopsy, endoscopic ultrasound versus dedicated multiphase pancreatic CT. At this time, general surgery recommending EUS. -GI and general surgery following, appreciate recommendations  -ERCP today, possibly followed by EUS at later point to further characterize the mass with biopsy for pathologic diagnosis -NPO except for sips  #HFrEF 2/2 non-ischemic dilated cardiomyopathy, chronic #Recurrent V fib and V tach s/p ICD placement #Bradycardia, chronic Patient's prior echocardiogram in 2020 with LVEF 78-29%, grade 2 diastolic dysfunction, moderate LV dilation. Repeat echocardiogram obtained during this admission per cardiology recommendations for  surgical clearance revealed LVEF of 45-50% with global hypokinesis and grade 3 diastolic dysfunction. VF and VT have been well controlled with amiodarone $RemoveBeforeD'200mg'[id number]$  recently reduced from $RemoveBefo'400mg'pfmmpwTiyDu$ . Cardiology consulted and reports patient to be "completely stable from a cardiac standpoint." -Continue home ASA $Remove'81mg'OoyfkcE$  -Continue home amiodarone $RemoveBeforeD'200mg'PhbvgwTiGgcrdB$  -Holding home Entresto 24-$RemoveBeforeDE'26mg'OvddDDCKPcQrGGH$  BID and bisoprolol $RemoveBefore'10mg'pmtDZohViAHxk$  daily for hypotension on admission  -Will need to consider restarting these medications following interventions -Telemetry  #Essential HTN, chronic Patient's blood pressure stable following aggressive fluid resuscitation on admission and holding of home bisoprolol and Etresto. -Continue holding home bisoprolol and Entresto  #CKD II, chronic Patient's baseline creatinine of 1.3, currently 1.39 -Monitor creatinine on daily CMP -avoid nephrotoxins  #Asthma, chronic -Continue home symbicort  #Diet: NPO #VTE  ppx: SCDs #Code status: Full code #IVF: 75cc/hr  Cato Mulligan, MD 05/27/2020, 11:57 AM Pager: [phone] After 5pm on weekdays and 1pm on weekends: On Call pager [phone]

## 2020-05-27 NOTE — Anesthesia Procedure Notes (Signed)
Procedure Name: Intubation Date/Time: 05/27/2020 2:27 PM Performed by: Mariea Clonts, CRNA Pre-anesthesia Checklist: Patient identified, Emergency Drugs available, Suction available and Patient being monitored Patient Re-evaluated:Patient Re-evaluated prior to induction Oxygen Delivery Method: Circle System Utilized Preoxygenation: Pre-oxygenation with 100% oxygen Induction Type: IV induction Ventilation: Mask ventilation without difficulty and Oral airway inserted - appropriate to patient size Laryngoscope Size: Sabra Heck and 3 Grade View: Grade II Tube type: Oral Tube size: 8.0 mm Number of attempts: 1 Airway Equipment and Method: Stylet and Oral airway Placement Confirmation: ETT inserted through vocal cords under direct vision,  positive ETCO2 and breath sounds checked- equal and bilateral Tube secured with: Tape Dental Injury: Teeth and Oropharynx as per pre-operative assessment

## 2020-05-27 NOTE — Transfer of Care (Signed)
Immediate Anesthesia Transfer of Care Note  Patient: Phill Myron  Procedure(s) Performed: ENDOSCOPIC RETROGRADE CHOLANGIOPANCREATOGRAPHY (ERCP) (N/A ) SPHINCTEROTOMY REMOVAL OF STONES  Patient Location: Endoscopy Unit  Anesthesia Type:General  Level of Consciousness: awake, alert  and oriented  Airway & Oxygen Therapy: Patient Spontanous Breathing and Patient connected to nasal cannula oxygen  Post-op Assessment: Report given to RN, Post -op Vital signs reviewed and stable and Patient moving all extremities X 4  Post vital signs: Reviewed and stable  Last Vitals:  Vitals Value Taken Time  BP 145/80 05/27/20 1520  Temp 36.4 C 05/27/20 1520  Pulse 45 05/27/20 1527  Resp 24 05/27/20 1527  SpO2 95 % 05/27/20 1527  Vitals shown include unvalidated device data.  Last Pain:  Vitals:   05/27/20 1520  TempSrc: Axillary  PainSc: 0-No pain         Complications: No complications documented.

## 2020-05-27 NOTE — Op Note (Signed)
[facility] Patient Name: [name] Procedure Date : 05/27/2020 MRN: [id number] Attending MD: Clarene Essex , MD Date of Birth: 1970/10/21 CSN: [id number] Age: 49 Admit Type: Inpatient Procedure:                ERCP Indications:              Evaluation and possible treatment of bile duct                            stone(s) seen on CT and patient with elevated liver                            tests Providers:                Clarene Essex, MD, Kary Kos RN, RN, Josie Dixon, RN, Cletis Athens, Technician, Institute, Technician, Virgilio Belling. Beckner, CRNA Referring MD:              Medicines:                General Anesthesia Complications:            No immediate complications. Estimated Blood Loss:     Estimated blood loss was minimal. Procedure:                Pre-Anesthesia Assessment:                           - Prior to the procedure, a History and Physical                            was performed, and patient medications and                            allergies were reviewed. The patient's tolerance of                            previous anesthesia was also reviewed. The risks                            and benefits of the procedure and the sedation                            options and risks were discussed with the patient.                            All questions were answered, and informed consent                            was obtained. Prior Anticoagulants: The patient has  taken no previous anticoagulant or antiplatelet                            agents except for aspirin. ASA Grade Assessment:                            III - A patient with severe systemic disease. After                            reviewing the risks and benefits, the patient was                            deemed in satisfactory condition to undergo the                            procedure.                            After obtaining informed consent, the scope was                            passed under direct vision. Throughout the                            procedure, the patient's blood pressure, pulse, and                            oxygen saturations were monitored continuously. The                            [id number]-Q180V ([id number] Olympus duodenoscope was                            introduced through the mouth, and used to inject                            contrast into and used to cannulate the bile duct.                            The ERCP was accomplished without difficulty. The                            patient tolerated the procedure well. Scope In: Scope Out: Findings:      The major papilla was bulging. Deep selective cannulation was readily       obtained without any obvious pancreatic duct injection or wire       advancement and we proceeded with a biliary sphincterotomy was made with       a Hydratome sphincterotome using ERBE electrocautery until there was       adequate biliary drainage. There was self limited oozing from the       sphincterotomy which did not require treatment when we pulled the second       balloon through and not during sphincterotomy. The biliary tree was       swept with  an adjustable9- 12 mm balloon starting at the bifurcation and       using the 12 mm balloon which passed readily through the duct and pain       sphincterotomy site without resistance. Sludge was swept from the duct.       No abnormalities were seen on occlusion cholangiogram and there was       adequate biliary drainage Impression:               - The major papilla appeared to be bulging.                           - A biliary sphincterotomy was performed.                           - The biliary tree was swept and sludge was found. Recommendation:           - Clear liquid diet for 6 hours. May have soft                            solids later today or tomorrow if doing well                            - Continue present medications.                           - Return to GI clinic PRN.                           - Telephone GI clinic if symptomatic PRN.                           - Perform an upper endoscopic ultrasound (UEUS) at                            appointment to be scheduled. If doing well our PA                            will schedule as an outpatient with Dr. Paulita Fujita and                            he will need surgical follow-up after that                            procedure and hopefully he can go home tomorrow and                            follow liver tests back to normal and would                            recommend repeating CA 19 when bili is back to                            normal just to see if it will return to normal as  well Procedure Code(s):        --- Professional ---                           [phone], Endoscopic retrograde                            cholangiopancreatography (ERCP); with removal of                            calculi/debris from biliary/pancreatic duct(s)                           43262, Endoscopic retrograde                            cholangiopancreatography (ERCP); with                            sphincterotomy/papillotomy Diagnosis Code(s):        --- Professional ---                           K80.50, Calculus of bile duct without cholangitis                            or cholecystitis without obstruction                           K83.8, Other specified diseases of biliary tract CPT copyright 2019 [employer]. All rights reserved. The codes documented in this report are preliminary and upon coder review may  be revised to meet current compliance requirements. Clarene Essex, MD 05/27/2020 3:25:15 PM This report has been signed electronically. Number of Addenda: 0

## 2020-05-27 NOTE — Progress Notes (Signed)
[name] 2:10 PM  Subjective: Patient seen and examined in hospital computer chart reviewed and case discussed with my partner Dr. Cristina Gong as well as Dr. Paulita Fujita and our PA and he is currently asymptomatic we rediscussed the procedure and answered all of his questions  Objective: Vital signs stable afebrile no acute distress exam please see preassessment evaluation CT and labs reviewed   Assessment: CBD stones  Plan: We rediscussed ERCP success rate risks etc. and will proceed today with anesthesia assistance with further work-up plans pending those findings  [facility]  office [phone] After 5PM or if no answer call [phone]

## 2020-05-27 NOTE — Progress Notes (Signed)
Progress Note  Day of Surgery  Subjective: Patient denies abdominal pain or nausea. Urine is still dark. GI planning ERCP today but not EUS.   Objective: Vital signs in last 24 hours: Temp:  [98.2 F (36.8 C)-98.3 F (36.8 C)] 98.3 F (36.8 C) (11/16 0400) Pulse Rate:  [50-60] 50 (11/16 0400) Resp:  [16-19] 18 (11/16 0400) BP: (114-131)/(61-82) 115/82 (11/16 0400) SpO2:  [98 %-100 %] 99 % (11/16 0400) Last BM Date: 05/25/20  Intake/Output from previous day: 11/15 0701 - 11/16 0700 In: 3121.4 [P.O.:350; I.V.:2584; IV Piggyback:187.5] Out: 1350 [Urine:1350] Intake/Output this shift: No intake/output data recorded.  PE: General: pleasant, WD, overweight male who is laying in bed in NAD HEENT:  Sclera are anicteric.  PERRL.  Ears and nose without any masses or lesions.  Mouth is pink and moist Heart: regular, rate, and rhythm.  Normal s1,s2. No obvious murmurs, gallops, or rubs noted.  Palpable radial and pedal pulses bilaterally Lungs: CTAB, no wheezes, rhonchi, or rales noted.  Respiratory effort nonlabored Abd: soft, NT, ND, +BS, no masses, hernias, or organomegaly MS: all 4 extremities are symmetrical with no cyanosis, clubbing, or edema. Skin: warm and dry with no masses, lesions, or rashes Neuro: Cranial nerves 2-12 grossly intact, sensation is normal throughout Psych: A&Ox3 with an appropriate affect.   Lab Results:  Recent Labs    05/26/20 0334 05/27/20 0354  WBC 5.8 4.8  HGB 15.5 15.2  HCT 47.4 46.0  PLT 140* 132*   BMET Recent Labs    05/26/20 0334 05/27/20 0354  NA 140 138  K 3.9 3.9  CL 107 106  CO2 26 22  GLUCOSE 103* 94  BUN 11 10  CREATININE 1.41* 1.39*  CALCIUM 8.6* 8.5*   PT/INR Recent Labs    05/25/20 0336  LABPROT 15.3*  INR 1.3*   CMP     Component Value Date/Time   NA 138 05/27/2020 0354   NA 144 12/03/2019 1703   K 3.9 05/27/2020 0354   CL 106 05/27/2020 0354   CO2 22 05/27/2020 0354   GLUCOSE 94 05/27/2020 0354   BUN  10 05/27/2020 0354   BUN 15 12/03/2019 1703   CREATININE 1.39 (H) 05/27/2020 0354   CREATININE 1.16 03/01/2016 1607   CALCIUM 8.5 (L) 05/27/2020 0354   PROT 5.7 (L) 05/27/2020 0354   PROT 6.6 05/18/2019 1607   ALBUMIN 2.2 (L) 05/27/2020 0354   ALBUMIN 4.2 05/18/2019 1607   AST 42 (H) 05/27/2020 0354   ALT 104 (H) 05/27/2020 0354   ALKPHOS 73 05/27/2020 0354   BILITOT 3.9 (H) 05/27/2020 0354   BILITOT 0.5 05/18/2019 1607   GFRNONAA >60 05/27/2020 0354   GFRNONAA >89 09/22/2015 1008   GFRAA 77 12/03/2019 1703   GFRAA >89 09/22/2015 1008   Lipase     Component Value Date/Time   LIPASE 31 05/24/2020 1956       Studies/Results: ECHOCARDIOGRAM COMPLETE  Result Date: 05/25/2020    ECHOCARDIOGRAM REPORT   Patient Name:   [name] Date of Exam: 05/25/2020 Medical Rec #:  [id number]     Height:       74.0 in Accession #:    [id number]    Weight:       253.5 lb Date of Birth:  1971/01/17    BSA:          2.404 m Patient Age:    49 years      BP:  106/69 mmHg Patient Gender: M             HR:           52 bpm. Exam Location:  Inpatient Procedure: 2D Echo, Cardiac Doppler and Color Doppler Indications:    Cardiomyopathy-Unspecified  History:        Patient has prior history of Echocardiogram examinations, most                 recent 06/12/2019. CHF, Arrythmias:Ventricular Fibrillation; Risk                 Factors:Dyslipidemia, Hypertension and Former Smoker. AICD.  Sonographer:    Clayton Lefort RDCS (AE) Referring Phys: [place]  1. Left ventricular ejection fraction, by estimation, is 45 to 50%. The left ventricle has mildly decreased function. The left ventricle demonstrates global hypokinesis. The left ventricular internal cavity size was mildly dilated. There is mild concentric left ventricular hypertrophy. Left ventricular diastolic parameters are consistent with Grade III diastolic dysfunction (restrictive). Elevated left ventricular end-diastolic pressure.   2. Right ventricular systolic function is normal. The right ventricular size is normal. There is normal pulmonary artery systolic pressure. The estimated right ventricular systolic pressure is 16.1 mmHg.  3. Left atrial size was moderately dilated.  4. The mitral valve is normal in structure. Trivial mitral valve regurgitation. No evidence of mitral stenosis.  5. The aortic valve is normal in structure. Aortic valve regurgitation is not visualized. Mild aortic valve sclerosis is present, with no evidence of aortic valve stenosis.  6. The inferior vena cava is normal in size with greater than 50% respiratory variability, suggesting right atrial pressure of 3 mmHg. FINDINGS  Left Ventricle: Left ventricular ejection fraction, by estimation, is 45 to 50%. The left ventricle has mildly decreased function. The left ventricle demonstrates global hypokinesis. The left ventricular internal cavity size was mildly dilated. There is  mild concentric left ventricular hypertrophy. Left ventricular diastolic parameters are consistent with Grade III diastolic dysfunction (restrictive). Elevated left ventricular end-diastolic pressure. Right Ventricle: The right ventricular size is normal. No increase in right ventricular wall thickness. Right ventricular systolic function is normal. There is normal pulmonary artery systolic pressure. The tricuspid regurgitant velocity is 2.59 m/s, and  with an assumed right atrial pressure of 3 mmHg, the estimated right ventricular systolic pressure is 09.6 mmHg. Left Atrium: Left atrial size was moderately dilated. Right Atrium: Right atrial size was normal in size. Pericardium: There is no evidence of pericardial effusion. Mitral Valve: The mitral valve is normal in structure. Trivial mitral valve regurgitation. No evidence of mitral valve stenosis. MV peak gradient, 7.3 mmHg. The mean mitral valve gradient is 2.0 mmHg. Tricuspid Valve: The tricuspid valve is normal in structure. Tricuspid valve  regurgitation is trivial. No evidence of tricuspid stenosis. Aortic Valve: The aortic valve is normal in structure. Aortic valve regurgitation is not visualized. Mild aortic valve sclerosis is present, with no evidence of aortic valve stenosis. Aortic valve mean gradient measures 7.0 mmHg. Aortic valve peak gradient measures 13.2 mmHg. Aortic valve area, by VTI measures 1.98 cm. Pulmonic Valve: The pulmonic valve was normal in structure. Pulmonic valve regurgitation is not visualized. No evidence of pulmonic stenosis. Aorta: The aortic root is normal in size and structure. Venous: The inferior vena cava is normal in size with greater than 50% respiratory variability, suggesting right atrial pressure of 3 mmHg. IAS/Shunts: No atrial level shunt detected by color flow Doppler. Additional Comments: A pacer wire  is visualized.  LEFT VENTRICLE PLAX 2D LVIDd:         5.80 cm      Diastology LVIDs:         4.30 cm      LV e' medial:    6.85 cm/s LV PW:         1.40 cm      LV E/e' medial:  19.7 LV IVS:        1.30 cm      LV e' lateral:   12.40 cm/s LVOT diam:     2.10 cm      LV E/e' lateral: 10.9 LV SV:         81 LV SV Index:   34 LVOT Area:     3.46 cm  LV Volumes (MOD) LV vol d, MOD A2C: 141.0 ml LV vol d, MOD A4C: 133.0 ml LV vol s, MOD A2C: 69.6 ml LV vol s, MOD A4C: 69.7 ml LV SV MOD A2C:     71.4 ml LV SV MOD A4C:     133.0 ml LV SV MOD BP:      67.8 ml RIGHT VENTRICLE             IVC RV Basal diam:  3.20 cm     IVC diam: 1.40 cm RV S prime:     15.70 cm/s TAPSE (M-mode): 2.4 cm LEFT ATRIUM              Index       RIGHT ATRIUM           Index LA diam:        4.00 cm  1.66 cm/m  RA Area:     23.40 cm LA Vol (A2C):   95.7 ml  39.81 ml/m RA Volume:   63.70 ml  26.50 ml/m LA Vol (A4C):   102.0 ml 42.43 ml/m LA Biplane Vol: 101.0 ml 42.01 ml/m  AORTIC VALVE AV Area (Vmax):    1.73 cm AV Area (Vmean):   1.75 cm AV Area (VTI):     1.98 cm AV Vmax:           182.00 cm/s AV Vmean:          127.000 cm/s AV VTI:             0.410 m AV Peak Grad:      13.2 mmHg AV Mean Grad:      7.0 mmHg LVOT Vmax:         90.70 cm/s LVOT Vmean:        64.000 cm/s LVOT VTI:          0.234 m LVOT/AV VTI ratio: 0.57  AORTA Ao Root diam: 3.00 cm Ao Asc diam:  3.00 cm MITRAL VALVE                TRICUSPID VALVE MV Area (PHT): 2.76 cm     TR Peak grad:   26.8 mmHg MV Peak grad:  7.3 mmHg     TR Vmax:        259.00 cm/s MV Mean grad:  2.0 mmHg MV Vmax:       1.35 m/s     SHUNTS MV Vmean:      57.0 cm/s    Systemic VTI:  0.23 m MV Decel Time: 275 msec     Systemic Diam: 2.10 cm MV E velocity: 135.00 cm/s MV A velocity: 57.50 cm/s MV E/A ratio:  2.35 Traci Turner  MD Electronically signed by Fransico Him MD Signature Date/Time: 05/25/2020/2:37:18 PM    Final     Anti-infectives: Anti-infectives (From admission, onward)   Start     Dose/Rate Route Frequency Ordered Stop   05/25/20 0200  piperacillin-tazobactam (ZOSYN) IVPB 3.375 g        3.375 g 12.5 mL/hr over 240 Minutes Intravenous Every 8 hours 05/25/20 0043     05/24/20 2030  piperacillin-tazobactam (ZOSYN) IVPB 3.375 g        3.375 g 12.5 mL/hr over 240 Minutes Intravenous  Once 05/24/20 2029 05/24/20 2138       Assessment/Plan HFrEF secondary to NIDCM Recurrent VF/VT s/p AICD HTN HLD CKD stage II  Duodenal vs pancreatic mass Biliary dilation with elevated LFTs and Tbili - AST/ALT improved today, Tbili 3.9 today  - agree that slow growth less indicative of adenocarcinoma but etiology of mass needs to be considered prior to operative management  - acute cholecystitis unlikely given no RUQ pain  - patient unable to undergo MRCP due to AICD - Recommend ERCP for ductal interrogation, possible clearance, and possible stenting if indicated given clinical evidence of biliary obstruction, this is planned for today  - Recommend EUS to further characterize the mass with biopsy for pathologic diagnosis - CEA normal and CA19-9 elevated at 83 - no indication for emergent  surgical intervention at this time   FEN: NPO, IVF VTE: SCDs ID: Zosyn 11/14>>  LOS: 2 days    Norm Parcel , [facility] 05/27/2020, 9:27 AM Please see Amion for pager number during day hours 7:00am-4:30pm

## 2020-05-27 NOTE — Anesthesia Postprocedure Evaluation (Signed)
Anesthesia Post Note  Patient: [name]  Procedure(s) Performed: ENDOSCOPIC RETROGRADE CHOLANGIOPANCREATOGRAPHY (ERCP) (N/A ) SPHINCTEROTOMY REMOVAL OF STONES     Patient location during evaluation: PACU Anesthesia Type: General Level of consciousness: awake and alert Pain management: pain level controlled Vital Signs Assessment: post-procedure vital signs reviewed and stable Respiratory status: spontaneous breathing, nonlabored ventilation and respiratory function stable Cardiovascular status: blood pressure returned to baseline, stable and bradycardic Postop Assessment: no apparent nausea or vomiting Anesthetic complications: no   No complications documented.  Last Vitals:  Vitals:   05/27/20 1530 05/27/20 1540  BP: 134/77 127/77  Pulse: (!) 45 (!) 42  Resp: (!) 22 18  Temp:    SpO2: 95% 96%    Last Pain:  Vitals:   05/27/20 1540  TempSrc:   PainSc: 0-No pain                 Audry Pili

## 2020-05-27 NOTE — Anesthesia Preprocedure Evaluation (Addendum)
Anesthesia Evaluation  Patient identified by MRN, date of birth, ID band Patient awake    Reviewed: Allergy & Precautions, NPO status , Patient's Chart, lab work & pertinent test results  Airway Mallampati: II  TM Distance: >3 FB Neck ROM: Full    Dental no notable dental hx. (+) Chipped, Dental Advisory Given   Pulmonary asthma , former smoker,    Pulmonary exam normal breath sounds clear to auscultation       Cardiovascular hypertension, Pt. on medications + Cardiac Defibrillator  Rhythm:Regular Rate:Bradycardia  Echo 05/25/2020 1. Left ventricular ejection fraction, by estimation, is 45 to 50%. The left ventricle has mildly decreased function. The left ventricle demonstrates global hypokinesis. The left ventricular internal cavity size was mildly dilated. There is mild concentric left ventricular hypertrophy. Left ventricular diastolic parameters are consistent with Grade III diastolic dysfunction (restrictive). Elevated left ventricular end-diastolic pressure.  2. Right ventricular systolic function is normal. The right ventricular size is normal. There is normal pulmonary artery systolic pressure. The estimated right ventricular systolic pressure is 85.9 mmHg.  3. Left atrial size was moderately dilated.  4. The mitral valve is normal in structure. Trivial mitral valve regurgitation. No evidence of mitral stenosis.  5. The aortic valve is normal in structure. Aortic valve regurgitation is not visualized. Mild aortic valve sclerosis is present, with no evidence of aortic valve stenosis.  6. The inferior vena cava is normal in size with greater than 50% respiratory variability, suggesting right atrial pressure of 3 mmHg.    Neuro/Psych negative neurological ROS     GI/Hepatic negative GI ROS, Neg liver ROS,   Endo/Other  negative endocrine ROS  Renal/GU Renal disease     Musculoskeletal negative musculoskeletal ROS (+)    Abdominal (+) + obese,   Peds  Hematology negative hematology ROS (+)   Anesthesia Other Findings   Reproductive/Obstetrics                            Anesthesia Physical Anesthesia Plan  ASA: III  Anesthesia Plan: General   Post-op Pain Management:    Induction: Intravenous  PONV Risk Score and Plan: 2 and Ondansetron, Dexamethasone, Treatment may vary due to age or medical condition and Midazolam  Airway Management Planned: Oral ETT  Additional Equipment: None  Intra-op Plan:   Post-operative Plan: Extubation in OR  Informed Consent: I have reviewed the patients History and Physical, chart, labs and discussed the procedure including the risks, benefits and alternatives for the proposed anesthesia with the patient or authorized representative who has indicated his/her understanding and acceptance.     Dental advisory given  Plan Discussed with: CRNA  Anesthesia Plan Comments:        Anesthesia Quick Evaluation

## 2020-05-28 LAB — COMPREHENSIVE METABOLIC PANEL
ALT: 78 U/L — ABNORMAL HIGH (ref 0–44)
AST: 28 U/L (ref 15–41)
Albumin: 2.2 g/dL — ABNORMAL LOW (ref 3.5–5.0)
Alkaline Phosphatase: 69 U/L (ref 38–126)
Anion gap: 11 (ref 5–15)
BUN: 17 mg/dL (ref 6–20)
CO2: 20 mmol/L — ABNORMAL LOW (ref 22–32)
Calcium: 8.4 mg/dL — ABNORMAL LOW (ref 8.9–10.3)
Chloride: 106 mmol/L (ref 98–111)
Creatinine, Ser: 1.37 mg/dL — ABNORMAL HIGH (ref 0.61–1.24)
GFR, Estimated: >60 mL/min (ref >60)
Glucose, Bld: 151 mg/dL — ABNORMAL HIGH (ref 70–99)
Potassium: 4.4 mmol/L (ref 3.5–5.1)
Sodium: 137 mmol/L (ref 135–145)
Total Bilirubin: 3 mg/dL — ABNORMAL HIGH (ref 0.3–1.2)
Total Protein: 5.6 g/dL — ABNORMAL LOW (ref 6.5–8.1)

## 2020-05-28 LAB — CBC
HCT: 46.1 % (ref 39.0–52.0)
Hemoglobin: 15.3 g/dL (ref 13.0–17.0)
MCH: 28.3 pg (ref 26.0–34.0)
MCHC: 33.2 g/dL (ref 30.0–36.0)
MCV: 85.2 fL (ref 80.0–100.0)
Platelets: 162 10*3/uL (ref 150–400)
RBC: 5.41 MIL/uL (ref 4.22–5.81)
RDW: 14.1 % (ref 11.5–15.5)
WBC: 5.3 10*3/uL (ref 4.0–10.5)
nRBC: 0 % (ref 0.0–0.2)

## 2020-05-28 MED ORDER — BISOPROLOL FUMARATE 5 MG PO TABS
10.0000 mg | ORAL_TABLET | Freq: Every day | ORAL | Status: DC
  Administered 2020-05-28: 10 mg via ORAL
  Filled 2020-05-28: qty 2

## 2020-05-28 MED ORDER — AMOXICILLIN-POT CLAVULANATE 875-125 MG PO TABS: 1.0000 | ORAL_TABLET | Freq: Two times a day (BID) | ORAL | 0 refills | Status: AC

## 2020-05-28 MED ORDER — PNEUMOCOCCAL VAC POLYVALENT 25 MCG/0.5ML IJ INJ
0.5000 mL | INJECTION | INTRAMUSCULAR | Status: AC
  Administered 2020-05-28: 0.5 mL via INTRAMUSCULAR
  Filled 2020-05-28: qty 0.5

## 2020-05-28 MED ORDER — SACUBITRIL-VALSARTAN 24-26 MG PO TABS
1.0000 | ORAL_TABLET | Freq: Two times a day (BID) | ORAL | Status: DC
  Administered 2020-05-28: 1 via ORAL
  Filled 2020-05-28 (×2): qty 1

## 2020-05-28 NOTE — Discharge Summary (Addendum)
Name: [name] MRN: [id number] DOB: 19-Nov-1970 49 y.o. PCP: Antony Blackbird, MD  Date of Admission: 05/24/2020  7:47 PM Date of Discharge: 05/28/2020 Attending Physician: Lenice Pressman, MD, PhD   Discharge Diagnosis: 1. Principal Problem:   Abdominal mass Active Problems:   Chronic systolic heart failure (HCC)   Cholelithiasis  Discharge Medications: Allergies as of 05/28/2020       Reactions   Ace Inhibitors Cough        Medication List     STOP taking these medications    hydrocortisone 2.5 % rectal cream Commonly known as: ANUSOL-HC   predniSONE 10 MG tablet Commonly known as: DELTASONE       TAKE these medications    amiodarone 200 MG tablet Commonly known as: PACERONE Take 1 tablet (200 mg total) by mouth daily.   amoxicillin-clavulanate 875-125 MG tablet Commonly known as: Augmentin Take 1 tablet by mouth 2 (two) times daily for 2 doses.   aspirin 81 MG tablet Take 1 tablet (81 mg total) by mouth 2 (two) times daily.   bisoprolol 10 MG tablet Commonly known as: ZEBETA Take 1 tablet (10 mg total) by mouth daily.   budesonide-formoterol 80-4.5 MCG/ACT inhaler Commonly known as: SYMBICORT Inhale 2 puffs into the lungs 2 (two) times daily. Must keep upcoming office visit for refills What changed:  when to take this reasons to take this additional instructions   Entresto 24-26 MG Generic drug: sacubitril-valsartan Take 1 tablet by mouth 2 (two) times daily.        Disposition and follow-up:   [name] was discharged from [facility] in Stable condition.  At the hospital follow up visit please address:  1.  Pancreaticoduodenal mass with transaminitis/hyperbilirubinemia/cholelithiasis: Please assess patient's follow-up with general surgery and gastroenterology. Patient will required endoscopic ultrasound and consideration of biopsy followed by surgical intervention.  2.  Labs / imaging needed at time of  follow-up: CBC, CMP  3.  Pending labs/ test needing follow-up: none  Follow-up Appointments:  Follow-up Information     Surgery, [place]. Call.   Specialty: General Surgery Why: Call and schedule an appointment to be seen by Dr. Barry Dienes or Dr. Zenia Resides after EUS is done by GI.  Contact information: [place] [address] [phone]         Arta Silence, MD. Go in 1 week(s).   Specialty: Gastroenterology Contact information: [address] [address] [phone]         Antony Blackbird, MD. Call in 1 week(s).   Specialty: Family Medicine Contact information: [place] [address] [phone]         Evans Lance, MD .   Specialty: Cardiology Contact information: [phone] [place] [address] [phone]                Hospital Course by problem list: #Intra/extra-hepatic biliary duct dilation s/p sphincterotomy #Cholelithiasis, active #Hyperbilirubinemia, improved #Acute hepatitis, improved Patient presented with acute onset periumbilical abdominal pain and was found to have transaminitis, hyperbilirubinemia and radiologic evidence of cholelithiasis and intra/extrahepatic biliary duct dilation. He also had a persistent mass in the pancreas or duodenum, slightly larger than on prior imaging. As patient was hypotensive, tachypneic, with a temperature of 100.2 and mild leukocytosis he was started on zosyn. GI consulted and felt that patient's cholelithiasis was likely the cause of his abdominal pain rather than his pancreatic/duodenal mass given  overall presentation. General surgery consulted. Patient had persistent hyperbilirubinemia and underwent ERCP which revealed bulging of major papillae as well as biliary sludge. He underwent sphincterotomy with improvement in LFTs and bilirubin level on morning labs. The plan is for outpatient EUS and biopsy of his  pancreaticoduodenal mass to determine if he needs concurrent excision during his cholecystectomy. At discharge he was prescribed one day of Augmentin to complete a total of 5 days.   #Pancreaticoduodenal mass, chronic Patient presented with acute onset periumbilical abdominal pain. CT Abdomen/Pelvis revealed a 4.7cm mass between the pancreas and duodenum which was present on prior CT renal study in 2019. 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 GI and general surgery consulted. CA-19-9 mildly elevated at 18.8, CEA is normal. Planning EUS and biopsy as above to aid in surgical planning before cholecystectomy   #HFrEF 2/2 non-ischemic dilated cardiomyopathy, chronic #Recurrent V fib and V tach s/p ICD placement #Bradycardia, chronic Patient's prior echocardiogram in 2020 with LVEF 99-24%, grade 2 diastolic dysfunction, moderate LV dilation. Repeat echocardiogram obtained during this admission per cardiology recommendations for surgical clearance revealed LVEF of 45-50% with global hypokinesis and grade 3 diastolic dysfunction. VF and VT have been well controlled with amiodarone 200mg  recently reduced from 400mg . Entresto and bisoprolol initially held in setting of hypotension.  -Continue home ASA 81mg  -Continue home amiodarone 200mg  -Restarted home Entresto 24-26mg  BID prior to discharge -Restarted home bisoprolol 10mg  daily prior to discharge   #Essential HTN, chronic Patient's blood pressure stable. Restarted home Entresto and bisoprolol prior to discharge.   #CKD II, chronic Patient's creatinine at baseline of 1.3   #Asthma, chronic -Continue home symbicort  Discharge Vitals:   BP 108/70 (BP Location: Left Arm)    Pulse (!) 46    Temp 98.1 F (36.7 C) (Oral)    Resp 19    Ht 6\' 2"  (1.88 m)    Wt 243 lb  (110.2 kg)    SpO2 98%    BMI 31.20 kg/m   Pertinent Labs, Studies, and Procedures:  CBC Latest Ref Rng & Units 05/28/2020 05/27/2020 05/26/2020  WBC 4.0 - 10.5 K/uL 5.3 4.8 5.8  Hemoglobin 13.0 - 17.0 g/dL 15.3 15.2 15.5  Hematocrit 39 - 52 % 46.1 46.0 47.4  Platelets 150 - 400 K/uL 162 132(L) 140(L)   CMP Latest Ref Rng & Units 05/28/2020 05/27/2020 05/26/2020  Glucose 70 - 99 mg/dL 151(H) 94 103(H)  BUN 6 - 20 mg/dL 17 10 11   Creatinine 0.61 - 1.24 mg/dL 1.37(H) 1.39(H) 1.41(H)  Sodium 135 - 145 mmol/L 137 138 140  Potassium 3.5 - 5.1 mmol/L 4.4 3.9 3.9  Chloride 98 - 111 mmol/L 106 106 107  CO2 22 - 32 mmol/L 20(L) 22 26  Calcium 8.9 - 10.3 mg/dL 8.4(L) 8.5(L) 8.6(L)  Total Protein 6.5 - 8.1 g/dL 5.6(L) 5.7(L) 5.4(L)  Total Bilirubin 0.3 - 1.2 mg/dL 3.0(H) 3.9(H) 5.4(H)  Alkaline Phos 38 - 126 U/L 69 73 67  AST 15 - 41 U/L 28 42(H) 58(H)  ALT 0 - 44 U/L 78(H) 104(H) 148(H)  DG ERCP BILIARY & PANCREATIC DUCTS  Result Date: 05/27/2020 CLINICAL DATA:  Pancreatic mass.  ERCP. EXAM: ERCP TECHNIQUE: Multiple spot images obtained with the fluoroscopic device and submitted for interpretation post-procedure. COMPARISON:  CT abdomen pelvis-05/24/2020 FINDINGS: Three spot fluoroscopic images of the right upper  abdominal quadrant during ERCP are provided for review Initial image demonstrates an ERCP probe overlying the right upper abdominal quadrant. There is selective cannulation opacification of the common bile duct which appears nondilated. There is minimal opacification of the cystic duct and intrahepatic biliary tree. There is no definitive opacification of the pancreatic duct. There is faint opacification of the gallbladder which contains several nonocclusive filling defects compatible with known cholelithiasis demonstrated on preceding abdominal CT. IMPRESSION: ERCP with findings of cholelithiasis without definitive choledocholithiasis. These images were submitted for radiologic  interpretation only. Please see the procedural report for the amount of contrast and the fluoroscopy time utilized. Electronically Signed   By: [name] M.D.   On: 05/27/2020 16:16   ECHOCARDIOGRAM COMPLETE  Result Date: 05/25/2020    ECHOCARDIOGRAM REPORT   Patient Name:   [name] Date of Exam: 05/25/2020 Medical Rec #:  [id number]     Height:       74.0 in Accession #:    [id number]    Weight:       253.5 lb Date of Birth:  [birth year]    BSA:          2.404 m Patient Age:    65 years      BP:           106/69 mmHg Patient Gender: M             HR:           52 bpm. Exam Location:  Inpatient Procedure: 2D Echo, Cardiac Doppler and Color Doppler Indications:    Cardiomyopathy-Unspecified  History:        Patient has prior history of Echocardiogram examinations, most                 recent 06/12/2019. CHF, Arrythmias:Ventricular Fibrillation; Risk                 Factors:Dyslipidemia, Hypertension and Former Smoker. AICD.  Sonographer:    Clayton Lefort RDCS (AE) Referring Phys: [place]  1. Left ventricular ejection fraction, by estimation, is 45 to 50%. The left ventricle has mildly decreased function. The left ventricle demonstrates global hypokinesis. The left ventricular internal cavity size was mildly dilated. There is mild concentric left ventricular hypertrophy. Left ventricular diastolic parameters are consistent with Grade III diastolic dysfunction (restrictive). Elevated left ventricular end-diastolic pressure.  2. Right ventricular systolic function is normal. The right ventricular size is normal. There is normal pulmonary artery systolic pressure. The estimated right ventricular systolic pressure is 66.2 mmHg.  3. Left atrial size was moderately dilated.  4. The mitral valve is normal in structure. Trivial mitral valve regurgitation. No evidence of mitral stenosis.  5. The aortic valve is normal in structure. Aortic valve regurgitation is not visualized. Mild aortic valve  sclerosis is present, with no evidence of aortic valve stenosis.  6. The inferior vena cava is normal in size with greater than 50% respiratory variability, suggesting right atrial pressure of 3 mmHg. FINDINGS  Left Ventricle: Left ventricular ejection fraction, by estimation, is 45 to 50%. The left ventricle has mildly decreased function. The left ventricle demonstrates global hypokinesis. The left ventricular internal cavity size was mildly dilated. There is  mild concentric left ventricular hypertrophy. Left ventricular diastolic parameters are consistent with Grade III diastolic dysfunction (restrictive). Elevated left ventricular end-diastolic pressure. Right Ventricle: The right ventricular size is normal. No increase in right ventricular wall thickness. Right ventricular systolic function is normal. There  is normal pulmonary artery systolic pressure. The tricuspid regurgitant velocity is 2.59 m/s, and  with an assumed right atrial pressure of 3 mmHg, the estimated right ventricular systolic pressure is 68.1 mmHg. Left Atrium: Left atrial size was moderately dilated. Right Atrium: Right atrial size was normal in size. Pericardium: There is no evidence of pericardial effusion. Mitral Valve: The mitral valve is normal in structure. Trivial mitral valve regurgitation. No evidence of mitral valve stenosis. MV peak gradient, 7.3 mmHg. The mean mitral valve gradient is 2.0 mmHg. Tricuspid Valve: The tricuspid valve is normal in structure. Tricuspid valve regurgitation is trivial. No evidence of tricuspid stenosis. Aortic Valve: The aortic valve is normal in structure. Aortic valve regurgitation is not visualized. Mild aortic valve sclerosis is present, with no evidence of aortic valve stenosis. Aortic valve mean gradient measures 7.0 mmHg. Aortic valve peak gradient measures 13.2 mmHg. Aortic valve area, by VTI measures 1.98 cm. Pulmonic Valve: The pulmonic valve was normal in structure. Pulmonic valve  regurgitation is not visualized. No evidence of pulmonic stenosis. Aorta: The aortic root is normal in size and structure. Venous: The inferior vena cava is normal in size with greater than 50% respiratory variability, suggesting right atrial pressure of 3 mmHg. IAS/Shunts: No atrial level shunt detected by color flow Doppler. Additional Comments: A pacer wire is visualized.  LEFT VENTRICLE PLAX 2D LVIDd:         5.80 cm      Diastology LVIDs:         4.30 cm      LV e' medial:    6.85 cm/s LV PW:         1.40 cm      LV E/e' medial:  19.7 LV IVS:        1.30 cm      LV e' lateral:   12.40 cm/s LVOT diam:     2.10 cm      LV E/e' lateral: 10.9 LV SV:         81 LV SV Index:   34 LVOT Area:     3.46 cm  LV Volumes (MOD) LV vol d, MOD A2C: 141.0 ml LV vol d, MOD A4C: 133.0 ml LV vol s, MOD A2C: 69.6 ml LV vol s, MOD A4C: 69.7 ml LV SV MOD A2C:     71.4 ml LV SV MOD A4C:     133.0 ml LV SV MOD BP:      67.8 ml RIGHT VENTRICLE             IVC RV Basal diam:  3.20 cm     IVC diam: 1.40 cm RV S prime:     15.70 cm/s TAPSE (M-mode): 2.4 cm LEFT ATRIUM              Index       RIGHT ATRIUM           Index LA diam:        4.00 cm  1.66 cm/m  RA Area:     23.40 cm LA Vol (A2C):   95.7 ml  39.81 ml/m RA Volume:   63.70 ml  26.50 ml/m LA Vol (A4C):   102.0 ml 42.43 ml/m LA Biplane Vol: 101.0 ml 42.01 ml/m  AORTIC VALVE AV Area (Vmax):    1.73 cm AV Area (Vmean):   1.75 cm AV Area (VTI):     1.98 cm AV Vmax:           182.00  cm/s AV Vmean:          127.000 cm/s AV VTI:            0.410 m AV Peak Grad:      13.2 mmHg AV Mean Grad:      7.0 mmHg LVOT Vmax:         90.70 cm/s LVOT Vmean:        64.000 cm/s LVOT VTI:          0.234 m LVOT/AV VTI ratio: 0.57  AORTA Ao Root diam: 3.00 cm Ao Asc diam:  3.00 cm MITRAL VALVE                TRICUSPID VALVE MV Area (PHT): 2.76 cm     TR Peak grad:   26.8 mmHg MV Peak grad:  7.3 mmHg     TR Vmax:        259.00 cm/s MV Mean grad:  2.0 mmHg MV Vmax:       1.35 m/s     SHUNTS MV  Vmean:      57.0 cm/s    Systemic VTI:  0.23 m MV Decel Time: 275 msec     Systemic Diam: 2.10 cm MV E velocity: 135.00 cm/s MV A velocity: 57.50 cm/s MV E/A ratio:  2.35 Fransico Him MD Electronically signed by Fransico Him MD Signature Date/Time: 05/25/2020/2:37:18 PM    Final     Discharge Instructions: Discharge Instructions     Call MD for:  difficulty breathing, headache or visual disturbances   Complete by: As directed    Call MD for:  extreme fatigue   Complete by: As directed    Call MD for:  hives   Complete by: As directed    Call MD for:  persistant dizziness or light-headedness   Complete by: As directed    Call MD for:  persistant nausea and vomiting   Complete by: As directed    Call MD for:  redness, tenderness, or signs of infection (pain, swelling, redness, odor or green/yellow discharge around incision site)   Complete by: As directed    Call MD for:  severe uncontrolled pain   Complete by: As directed    Call MD for:  temperature >100.4   Complete by: As directed    Diet - low sodium heart healthy   Complete by: As directed    Increase activity slowly   Complete by: As directed        Signed: Cato Mulligan, MD 05/29/2020, 1:57 PM   Pager: [phone]

## 2020-05-28 NOTE — Progress Notes (Signed)
[facility] Progress Note  [name] 49 y.o. Feb 23, 1971  CC:  Transaminitis, pancreatic mass   Subjective: Patient reports feeling well today.  Notes some "fullness" in upper abdomen, but denies any abdominal pain.  Denies any nausea or vomiting.  ROS : Review of Systems  Cardiovascular: Negative for chest pain and palpitations.  Gastrointestinal: Negative for abdominal pain, blood in stool, constipation, diarrhea, heartburn, melena, nausea and vomiting.    Objective: Vital signs in last 24 hours: Vitals:   05/28/20 0355 05/28/20 0823  BP: 117/73 124/87  Pulse: (!) 48   Resp: 18 18  Temp: 97.6 F (36.4 C) 98 F (36.7 C)  SpO2: 99% 99%    Physical Exam:  General:  Alert, oriented, cooperative, no distress, appears stated age  Head:  Normocephalic, without obvious abnormality, atraumatic  Eyes:   Scleral icterus, EOMs intact  Lungs:   Clear to auscultation bilaterally, respirations unlabored  Heart:  Regular rate and rhythm, S1, S2 normal  Abdomen:   Soft, non-tender, bowel sounds active all four quadrants, no guarding or peritoneal signs  Extremities: Extremities normal, atraumatic, no  edema  Pulses: 2+ and symmetric    Lab Results: Recent Labs    05/27/20 0354 05/28/20 0224  NA 138 137  K 3.9 4.4  CL 106 106  CO2 22 20*  GLUCOSE 94 151*  BUN 10 17  CREATININE 1.39* 1.37*  CALCIUM 8.5* 8.4*   Recent Labs    05/27/20 0354 05/28/20 0224  AST 42* 28  ALT 104* 78*  ALKPHOS 73 69  BILITOT 3.9* 3.0*  PROT 5.7* 5.6*  ALBUMIN 2.2* 2.2*   Recent Labs    05/27/20 0354 05/28/20 0224  WBC 4.8 5.3  HGB 15.2 15.3  HCT 46.0 46.1  MCV 85.3 85.2  PLT 132* 162   No results for input(s): LABPROT, INR in the last 72 hours.    Assessment: Transaminitis: CT revealed cholelithiasis and intra and extrahepatic biliary ductal dilation.  Patient underwent ERCP 05/27/2020 which did not reveal choledocholithiasis.  It is possible patient passed a  stone. -LFTs trending down.  T bili 3.0/AST 28/ALT 78/ALP 69 today as compared to T bili 3.9/AST 42/ALT 104/ALP 73 yesterday -WBCs remain normal (5.3)  Pancreatic mass: Radiographically stable over the past 2 years, possible a GIST.  Plan: Recommend outpatient EUS due to lack of availability for inpatient EUS this week.  We will arrange the EUS with Dr. Paulita Fujita, hopefully within the next 1-2 weeks.  OK to discharge from a GI standpoint.  [facility] will sign off.  Please contact us if we can be of any further assistance during this hospital stay.  Salley Slaughter PA-C 05/28/2020, 11:27 AM  Contact #  [phone]

## 2020-05-28 NOTE — Progress Notes (Addendum)
Subjective:  [name] is a 49 year old male with past medical history significant for HFrEF 2/2 non-ischemic dilated cardiomyopathy, recurrent V fib and V tach s/p ICD placement, HTN, HLD, and CKD2 who presented to [facility] ED on 11/13 for sudden onset periumbilical abdominal pain found to have mild growth in pancreaticoduodenal mass visible on CT from 2019 as well as transaminitis, hyperbilirubinemia with mild intra and extra-hepatic biliary ductal dilation in the setting of gallstones.  Yesterday afternoon, patient underwent ERCP without complications.  Overnight, no acute events.  This morning, patient reports that he feels well and denies abdominal pain. He says that he has been able to tolerate the liquid diet, but he worries that he may have difficulty with early satiety when advancing his diet. He understands and agrees with the plan to coordinate with gastroenterology and general surgery a safe discharge plan. He has no further questions or concerns.  Objective:  Vital signs in last 24 hours: Vitals:   05/27/20 1540 05/27/20 1550 05/27/20 2000 05/28/20 0355  BP: 127/77 128/79 128/75 117/73  Pulse: (!) 42 (!) 41 (!) 46 (!) 48  Resp: 18 20 18 18   Temp:   98.4 F (36.9 C) 97.6 F (36.4 C)  TempSrc:   Oral Oral  SpO2: 96% 97% 98% 99%  Weight:      Height:      SpO2: 99 %  Intake/Output Summary (Last 24 hours) at 05/28/2020 0643 Last data filed at 05/28/2020 0300 Gross per 24 hour  Intake 1712.63 ml  Output 410 ml  Net 1302.63 ml   Filed Weights   05/24/20 1952 05/27/20 1345  Weight: 115 kg 110.2 kg  Physical Exam Constitutional:      Appearance: He is well-developed.  HENT:     Head: Normocephalic and atraumatic.  Cardiovascular:     Rate and Rhythm: Regular rhythm. Bradycardia present.     Heart sounds: Normal heart sounds.  Pulmonary:     Effort: Pulmonary effort is normal. No respiratory distress.     Breath sounds: Normal breath sounds.   Abdominal:     General: Abdomen is flat. Bowel sounds are normal. There is no distension.     Palpations: Abdomen is soft.     Tenderness: There is no abdominal tenderness.  Skin:    General: Skin is warm and dry.     Capillary Refill: Capillary refill takes less than 2 seconds.  Neurological:     General: No focal deficit present.     Mental Status: He is alert and oriented to person, place, and time.  Psychiatric:        Mood and Affect: Mood normal.        Behavior: Behavior normal.    CBC Latest Ref Rng & Units 05/28/2020 05/27/2020 05/26/2020  WBC 4.0 - 10.5 K/uL 5.3 4.8 5.8  Hemoglobin 13.0 - 17.0 g/dL 15.3 15.2 15.5  Hematocrit 39 - 52 % 46.1 46.0 47.4  Platelets 150 - 400 K/uL 162 132(L) 140(L)   CMP Latest Ref Rng & Units 05/28/2020 05/27/2020 05/26/2020  Glucose 70 - 99 mg/dL 151(H) 94 103(H)  BUN 6 - 20 mg/dL 17 10 11   Creatinine 0.61 - 1.24 mg/dL 1.37(H) 1.39(H) 1.41(H)  Sodium 135 - 145 mmol/L 137 138 140  Potassium 3.5 - 5.1 mmol/L 4.4 3.9 3.9  Chloride 98 - 111 mmol/L 106 106 107  CO2 22 - 32 mmol/L 20(L) 22 26  Calcium 8.9 - 10.3 mg/dL 8.4(L) 8.5(L) 8.6(L)  Total Protein  6.5 - 8.1 g/dL 5.6(L) 5.7(L) 5.4(L)  Total Bilirubin 0.3 - 1.2 mg/dL 3.0(H) 3.9(H) 5.4(H)  Alkaline Phos 38 - 126 U/L 69 73 67  AST 15 - 41 U/L 28 42(H) 58(H)  ALT 0 - 44 U/L 78(H) 104(H) 148(H)   CEA - 2.2 CA 19-9 (collected 05/26/20) - 83  Assessment/Plan:  Principal Problem:   Abdominal mass Active Problems:   Chronic systolic heart failure (HCC)   Cholelithiasis  [name] is a 49 year old male with past medical history significant for HFrEF 2/2 non-ischemic dilated cardiomyopathy, recurrent V fib and V tach s/p ICD placement, HTN, HLD, and CKD2 who presented to [facility] ED on 11/13 for sudden onset periumbilical abdominal pain found to have mild growth in pancreaticoduodenal mass visible on CT from 2019 as well as transaminitis, hyperbilirubinemia with mild intra and  extra-hepatic biliary ductal dilation in the setting of gallstones.  #Intra/extra-hepatic biliary duct dilation s/p sphincterotomy #Cholelithiasis, active #Hyperbilirubinemia, improving #Acute hepatitis, improving Patient presented with acute onset periumbilical abdominal pain found to have transaminitis, hyperbilirubinemia and radiologic evidence of cholelithiasis and intra/extrahepatic biliary duct dilation without cholecystitis. As patient was hypotensive, tachypneic, with a temperature of 100.2 and mild leukocytosis he was started on zosyn for concern of associated intra-abdominal infection. GI consulted and felt that patient's cholelithiasis was likely the cause of his abdominal pain rather than his pancreatic/duodenal mass given overall presentation. General surgery consulted. Patient had persistent hyperbilirubinemia and underwent ERCP which revealed bulging of major papillae as well as biliary sludge. He underwent sphincterotomy with improvement in LFTs and bilirubin level on morning labs. General surgery recommended outpatient endoscopic ultrasound and biopsy with concurrent cholecystectomy and mass removal. -Follow-up outpatient GI and general surgery  -Outpatient EUS and biopsy with consideration for concurrent cholecystectomy and mass removal -Discontinue IVF -Continue Zosyn while admitted, discharge with one more day of Augmentin  #Pancreaticoduodenal mass, chronic Patient presented with acute onset periumbilical abdominal pain. CT Abdomen/Pelvis revealed a 4.7cm mass between the pancreas and duodenum which was present on prior CT renal study in 2019. 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 GI and general surgery consulted. CA-19-9 elevated, although CEA is within normal limits. General surgery  recommended outpatient endoscopic ultrasound and biopsy with concurrent cholecystectomy and mass removal. -Follow-up outpatient GI and general surgery  -Outpatient EUS and biopsy with consideration for concurrent cholecystectomy and mass removal -Advance diet today   #HFrEF 2/2 non-ischemic dilated cardiomyopathy, chronic #Recurrent V fib and V tach s/p ICD placement #Bradycardia, chronic Patient's prior echocardiogram in 2020 with LVEF 92-11%, grade 2 diastolic dysfunction, moderate LV dilation. Repeat echocardiogram obtained during this admission per cardiology recommendations for surgical clearance revealed LVEF of 45-50% with global hypokinesis and grade 3 diastolic dysfunction. VF and VT have been well controlled with amiodarone 200mg  recently reduced from 400mg . Entresto and bisoprolol initially held in setting of hypotension. Cardiology consulted and reports patient to be "completely stable from a cardiac standpoint." -Continue home ASA 81mg  -Continue home amiodarone 200mg  -Restart home Entresto 24-26mg  BID -Restart home bisoprolol 10mg  daily -Telemetry  #Essential HTN, chronic Patient's blood pressure stable. -Restart home Entresto 24-26mg  BID -Restart home bisoprolol 10mg  daily  #CKD II, chronic Patient's creatinine at baseline of 1.3 -Monitor creatinine on daily CMP -avoid nephrotoxins   #Asthma, chronic -Continue home symbicort  #Diet: Advance to heart healthy #VTE ppx:  SCDs #Code status: Full code #IVF: None  Cato Mulligan, MD 05/28/2020, 6:43 AM Pager: [phone] After 5pm on weekdays and 1pm on weekends: On Call pager [phone]

## 2020-05-28 NOTE — Discharge Instructions (Signed)
Mr. Menz,  It was a pleasure meeting you during your recent hospitalization. You presented to the emergency department with abdominal pain and found to have abnormal liver function test results and radiology findings of a pancreatic/duodenal mass. You will need to follow-up closely with gastroenterology and general surgery for further evaluation and management of your condition, including further imaging and possibly need for surgical intervention.  Sincerely, Dr. Paulla Dolly, MD

## 2020-05-28 NOTE — Progress Notes (Addendum)
Progress Note  1 Day Post-Op  Subjective: Denies abdominal pain or nausea. Discussed possibility of remaining workup being done as an outpatient and patient was agreeable to this.   Objective: Vital signs in last 24 hours: Temp:  [97.6 F (36.4 C)-98.9 F (37.2 C)] 97.6 F (36.4 C) (11/17 0355) Pulse Rate:  [41-48] 48 (11/17 0355) Resp:  [16-22] 18 (11/17 0355) BP: (117-149)/(73-80) 117/73 (11/17 0355) SpO2:  [95 %-99 %] 99 % (11/17 0355) Weight:  [110.2 kg] 110.2 kg (11/16 1345) Last BM Date: 05/27/20  Intake/Output from previous day: 11/16 0701 - 11/17 0700 In: 1712.6 [P.O.:150; I.V.:1456.1; IV Piggyback:106.5] Out: 410 [Urine:410] Intake/Output this shift: No intake/output data recorded.  PE: General: pleasant, WD,overweightmale who is sitting in bed in NAD HEENT: Sclera are anicteric.PERRL. Ears and nose without any masses or lesions. Mouth is pink and moist Heart: regular, rate, and rhythm. Normal s1,s2. No obvious murmurs, gallops, or rubs noted. Palpable radial and pedal pulses bilaterally Lungs: CTAB, no wheezes, rhonchi, or rales noted. Respiratory effort nonlabored Abd: soft, NT, ND, +BS, no masses, hernias, or organomegaly MS: all 4 extremities are symmetrical with no cyanosis, clubbing, or edema. Skin: warm and dry with no masses, lesions, or rashes Neuro: Cranial nerves 2-12 grossly intact, sensation is normal throughout Psych: A&Ox3 with an appropriate affect.   Lab Results:  Recent Labs    05/27/20 0354 05/28/20 0224  WBC 4.8 5.3  HGB 15.2 15.3  HCT 46.0 46.1  PLT 132* 162   BMET Recent Labs    05/27/20 0354 05/28/20 0224  NA 138 137  K 3.9 4.4  CL 106 106  CO2 22 20*  GLUCOSE 94 151*  BUN 10 17  CREATININE 1.39* 1.37*  CALCIUM 8.5* 8.4*   PT/INR No results for input(s): LABPROT, INR in the last 72 hours. CMP     Component Value Date/Time   NA 137 05/28/2020 0224   NA 144 12/03/2019 1703   K 4.4 05/28/2020 0224   CL  106 05/28/2020 0224   CO2 20 (L) 05/28/2020 0224   GLUCOSE 151 (H) 05/28/2020 0224   BUN 17 05/28/2020 0224   BUN 15 12/03/2019 1703   CREATININE 1.37 (H) 05/28/2020 0224   CREATININE 1.16 03/01/2016 1607   CALCIUM 8.4 (L) 05/28/2020 0224   PROT 5.6 (L) 05/28/2020 0224   PROT 6.6 05/18/2019 1607   ALBUMIN 2.2 (L) 05/28/2020 0224   ALBUMIN 4.2 05/18/2019 1607   AST 28 05/28/2020 0224   ALT 78 (H) 05/28/2020 0224   ALKPHOS 69 05/28/2020 0224   BILITOT 3.0 (H) 05/28/2020 0224   BILITOT 0.5 05/18/2019 1607   GFRNONAA >60 05/28/2020 0224   GFRNONAA >89 09/22/2015 1008   GFRAA 77 12/03/2019 1703   GFRAA >89 09/22/2015 1008   Lipase     Component Value Date/Time   LIPASE 31 05/24/2020 1956       Studies/Results: DG ERCP BILIARY & PANCREATIC DUCTS  Result Date: 05/27/2020 CLINICAL DATA:  Pancreatic mass.  ERCP. EXAM: ERCP TECHNIQUE: Multiple spot images obtained with the fluoroscopic device and submitted for interpretation post-procedure. COMPARISON:  CT abdomen pelvis-05/24/2020 FINDINGS: Three spot fluoroscopic images of the right upper abdominal quadrant during ERCP are provided for review Initial image demonstrates an ERCP probe overlying the right upper abdominal quadrant. There is selective cannulation opacification of the common bile duct which appears nondilated. There is minimal opacification of the cystic duct and intrahepatic biliary tree. There is no definitive opacification of the pancreatic duct.  There is faint opacification of the gallbladder which contains several nonocclusive filling defects compatible with known cholelithiasis demonstrated on preceding abdominal CT. IMPRESSION: ERCP with findings of cholelithiasis without definitive choledocholithiasis. These images were submitted for radiologic interpretation only. Please see the procedural report for the amount of contrast and the fluoroscopy time utilized. Electronically Signed   By: [name] M.D.   On: 05/27/2020  16:16    Anti-infectives: Anti-infectives (From admission, onward)   Start     Dose/Rate Route Frequency Ordered Stop   05/25/20 0200  piperacillin-tazobactam (ZOSYN) IVPB 3.375 g        3.375 g 12.5 mL/hr over 240 Minutes Intravenous Every 8 hours 05/25/20 0043     05/24/20 2030  piperacillin-tazobactam (ZOSYN) IVPB 3.375 g        3.375 g 12.5 mL/hr over 240 Minutes Intravenous  Once 05/24/20 2029 05/24/20 2138       Assessment/Plan HFrEF secondary to NIDCM Recurrent VF/VT s/p AICD HTN HLD CKD stage II  Duodenal vs pancreatic mass Biliary dilation with elevated LFTs and Tbili - AST/ALT improving, Tbili 3.0 today  - agree that slow growth less indicative of adenocarcinoma but etiology of mass needs to be considered prior to operative management  - acute cholecystitis unlikely given no RUQ pain  - patient unable to undergo MRCP due to AICD - s/p ERCP with sphincterotomy 11/16 - sludge swept from duct - Recommend EUS to further characterize the mass with biopsy for pathologic diagnosis - CEA normal and CA19-9 elevated at 83 - GI recommends repeating as outpatient to see if it remains elevated s/p ERCP - no indication for emergent surgical intervention at this time  - patient is clear for discharge from a surgical perspective, could have EUS done as an outpatient rather than remaining admitted since he is asymptomatic and then be referred back to surgical office after EUS - no further need for abx from a surgical perspective  FEN: CLD, IVF VTE: SCDs ID: [email or web address] 11/14>>  LOS: 3 days    Norm Parcel , [facility] 05/28/2020, 8:15 AM Please see Amion for pager number during day hours 7:00am-4:30pm

## 2020-05-28 NOTE — Plan of Care (Signed)
°  Problem: Education: Goal: Knowledge of General Education information will improve Description: Including pain rating scale, medication(s)/side effects and non-pharmacologic comfort measures Outcome: Adequate for Discharge   Problem: Health Behavior/Discharge Planning: Goal: Ability to manage health-related needs will improve Outcome: Adequate for Discharge   Problem: Clinical Measurements: Goal: Ability to maintain clinical measurements within normal limits will improve Outcome: Adequate for Discharge Goal: Will remain free from infection Outcome: Adequate for Discharge Goal: Diagnostic test results will improve Outcome: Adequate for Discharge   Problem: Activity: Goal: Risk for activity intolerance will decrease Outcome: Adequate for Discharge   Problem: Nutrition: Goal: Adequate nutrition will be maintained Outcome: Adequate for Discharge   Problem: Elimination: Goal: Will not experience complications related to bowel motility Outcome: Adequate for Discharge   Problem: Pain Managment: Goal: General experience of comfort will improve Outcome: Adequate for Discharge   Problem: Safety: Goal: Ability to remain free from injury will improve Outcome: Adequate for Discharge

## 2020-05-29 ENCOUNTER — Telehealth: Payer: Self-pay

## 2020-05-29 NOTE — Telephone Encounter (Signed)
Transition Care Management Unsuccessful Follow-up Telephone Call  Date of discharge and from where:  05/28/2020, [facility]   Attempts:  1st Attempt  Reason for unsuccessful TCM follow-up call:  Left voice message on # [phone]. Call back requested to this CM.  Patient needs to schedule hospital follow up appointment with provider at [facility]

## 2020-05-30 ENCOUNTER — Telehealth: Payer: Self-pay

## 2020-05-30 LAB — CULTURE, BLOOD (ROUTINE X 2)
Culture: NO GROWTH
Culture: NO GROWTH
Special Requests: ADEQUATE

## 2020-05-30 NOTE — Telephone Encounter (Signed)
Transition Care Management Follow-up Telephone Call   Date of discharge and from where:[facility] on 05/28/2020 How have you been since you were released from the hospital? Better nothing too serious Any questions or concerns? No questions/concerns reported.  Items Reviewed: Did the pt receive and understand the discharge instructions provided? Stated that have the instructions and have no questions.  Medications obtained and verified? He said that have the medication list  and the hospital staff reviewed them in detail prior to discharge.  said that he has all of the medications and they have no questions.  Any new allergies since your discharge? None reported  Do you have support at home? Yes, wife Other (ie: DME, Home Health, etc)       Functional Questionnaire: (I = Independent and D = Dependent) ADL's:  Independent.      Follow up appointments reviewed:    PCP Hospital f/u appt confirmed?06/03/2020 @ 1050.   [facility] f/u appt confirmed? Stated he will f/u with Dr Gwyndolyn Saxon on 06/04/2020  Are transportation arrangements needed?  have transportation    If their condition worsens, is the pt aware to call  their PCP or go to the ED? Yes.Made pt aware if condition worsen or start experiencing rapid weight gain, chest pain, diff breathing, SOB, high fevers, or bleading to refer imediately to ED for further evaluation.   Was the patient provided with contact information for the PCP's office or ED? He has the phone number   Was the pt encouraged to call back with questions or concerns?yes

## 2020-06-02 ENCOUNTER — Other Ambulatory Visit: Payer: Self-pay | Admitting: Gastroenterology

## 2020-06-03 ENCOUNTER — Ambulatory Visit: Payer: Managed Care, Other (non HMO) | Attending: Nurse Practitioner | Admitting: Nurse Practitioner

## 2020-06-03 ENCOUNTER — Encounter: Payer: Self-pay | Admitting: Nurse Practitioner

## 2020-06-03 ENCOUNTER — Other Ambulatory Visit: Payer: Self-pay

## 2020-06-03 DIAGNOSIS — Z8739 Personal history of other diseases of the musculoskeletal system and connective tissue: Secondary | ICD-10-CM

## 2020-06-03 DIAGNOSIS — Z7689 Persons encountering health services in other specified circumstances: Secondary | ICD-10-CM

## 2020-06-03 DIAGNOSIS — M7989 Other specified soft tissue disorders: Secondary | ICD-10-CM

## 2020-06-03 DIAGNOSIS — M25561 Pain in right knee: Secondary | ICD-10-CM | POA: Diagnosis not present

## 2020-06-03 DIAGNOSIS — M79662 Pain in left lower leg: Secondary | ICD-10-CM

## 2020-06-03 DIAGNOSIS — G8929 Other chronic pain: Secondary | ICD-10-CM

## 2020-06-03 MED ORDER — PREDNISONE 20 MG PO TABS: 20.0000 mg | ORAL_TABLET | Freq: Every day | ORAL | 0 refills | Status: AC

## 2020-06-03 NOTE — Telephone Encounter (Signed)
NOTED

## 2020-06-03 NOTE — Progress Notes (Signed)
Virtual Visit via Telephone Note Due to national recommendations of social distancing due to [place] 19, telehealth visit is felt to be most appropriate for this patient at this time.  I discussed the limitations, risks, security and privacy concerns of performing an evaluation and management service by telephone and the availability of in person appointments. I also discussed with the patient that there may be a patient responsible charge related to this service. The patient expressed understanding and agreed to proceed.    I connected with [name] on 06/03/20  at  10:50 AM EST  EDT by telephone and verified that I am speaking with the correct person using two identifiers.   Consent I discussed the limitations, risks, security and privacy concerns of performing an evaluation and management service by telephone and the availability of in person appointments. I also discussed with the patient that there may be a patient responsible charge related to this service. The patient expressed understanding and agreed to proceed.   Location of Patient: Private  Residence   Location of Provider: [place] [facility] [employer] Office    Persons participating in Telemedicine visit: Billy Rankins FNP-BC [place]    History of Present Illness: Telemedicine visit for: Establish Care He has a hx of Class II CHF, VT/VF, ICD (02-13-2010), HTN, HPL  CHF Taking entresto 24-26 mg BID, bisoprolol 10 mg daily and amiodarone 200 mg daily. Seeing Cardiology.   Today he endorses left leg pain and swelling. Recently discharged from the hospital after a 4 day IP stay for CHF, and Pancreaticoduodenal mass with transaminitis, hyperbilirubinemia, cholelithiasis cholelithiasis. He will require endoscopic [place] and biopsy and was instructed to follow up with GI and general surgery.    He also notes right knee pain and swelling. States he was given prednisone in the past which was completely  relieved his symptoms. He does have a history of gout. Needs uric acid level drawn.   Past Medical History:  Diagnosis Date  . Dyslipidemia   . Heart failure with mildly reduced EF    EF 35-40 in 2011 // Echo 06/2019: EF 45-50, Gr 2 DD, RAE, mild MR, mild TR, mild PI, RVSP 33.6  . HTN (hypertension)   . Hx of ventricular fibrillation    Hx of VFib arrest s/p AICD placement  . Nonischemic cardiomyopathy     Past Surgical History:  Procedure Laterality Date  . CARDIAC DEFIBRILLATOR PLACEMENT    . ERCP N/A 05/27/2020   Procedure: ENDOSCOPIC RETROGRADE CHOLANGIOPANCREATOGRAPHY (ERCP);  Surgeon: Clarene Essex, MD;  Location: [place] [facility];  Service: Endoscopy;  Laterality: N/A;  . PACEMAKER IMPLANT    . REMOVAL OF STONES  05/27/2020   Procedure: REMOVAL OF STONES;  Surgeon: Clarene Essex, MD;  Location: [facility] ENDOSCOPY;  Service: Endoscopy;;  . repeat echo  2008    demonstrated near normalization  . SPHINCTEROTOMY  05/27/2020   Procedure: SPHINCTEROTOMY;  Surgeon: Clarene Essex, MD;  Location: [facility] ENDOSCOPY;  Service: Endoscopy;;  . TRANSTHORACIC ECHOCARDIOGRAM  11/09/2005, 01/15/2009, 02/07/2010    Family History  Problem Relation Age of Onset  . Diabetes Father   . Heart Problems Father        unsure of what kind  . Asthma Maternal Uncle   . Diabetes Maternal Grandmother     Social History   Socioeconomic History  . Marital status: Married    Spouse name: Not on file  . Number of children: Not on file  . Years of education: Not on  file  . Highest education level: Not on file  Occupational History    Employer: [employer]  Tobacco Use  . Smoking status: Former Smoker    Packs/day: 0.25    Years: 2.00    Pack years: 0.50    Types: Cigarettes    Quit date: 07/12/1998    Years since quitting: 21.9  . Smokeless tobacco: Never Used  Vaping Use  . Vaping Use: Never used  Substance and Sexual Activity  . Alcohol use: No    Comment: Denies  . Drug use: No  . Sexual activity: Not on file   Other Topics Concern  . Not on file  Social History Narrative   The patient is married.  He is in native of [place],     living in [place] for 9 years.  He has a history of tobacco use,     but quit smoking a year ago.  Denies alcohol abuse.         Social Determinants of Health   Financial Resource Strain:   . Difficulty of Paying Living Expenses: Not on file  Food Insecurity:   . Worried About Charity fundraiser in the Last Year: Not on file  . Ran Out of Food in the Last Year: Not on file  Transportation Needs:   . Lack of Transportation (Medical): Not on file  . Lack of Transportation (Non-Medical): Not on file  Physical Activity:   . Days of Exercise per Week: Not on file  . Minutes of Exercise per Session: Not on file  Stress:   . Feeling of Stress : Not on file  Social Connections:   . Frequency of Communication with Friends and Family: Not on file  . Frequency of Social Gatherings with Friends and Family: Not on file  . Attends Religious Services: Not on file  . Active Member of Clubs or Organizations: Not on file  . Attends Archivist Meetings: Not on file  . Marital Status: Not on file     Observations/Objective: Awake, alert and oriented x 3   Review of Systems  Constitutional: Negative for fever, malaise/fatigue and weight loss.  HENT: Negative.  Negative for nosebleeds.   Eyes: Negative.  Negative for blurred vision, double vision and photophobia.  Respiratory: Negative.  Negative for cough and shortness of breath.   Cardiovascular: Positive for leg swelling. Negative for chest pain and palpitations.  Gastrointestinal: Negative.  Negative for heartburn, nausea and vomiting.  Musculoskeletal: Positive for joint pain. Negative for myalgias.  Neurological: Negative.  Negative for dizziness, focal weakness, seizures and headaches.  Psychiatric/Behavioral: Negative.  Negative for suicidal ideas.    Assessment and Plan: Iain was seen today for  hospitalization follow-up.  Diagnoses and all orders for this visit:  Encounter to establish care  Pain and swelling of left lower leg -     VAS [place] LOWER EXTREMITY VENOUS (DVT); Future -     predniSONE (DELTASONE) 20 MG tablet; Take 1 tablet (20 mg total) by mouth daily with breakfast for 5 days.  Chronic pain of right knee -     Uric Acid -     predniSONE (DELTASONE) 20 MG tablet; Take 1 tablet (20 mg total) by mouth daily with breakfast for 5 days. Work on losing weight to help reduce joint pain. May alternate with heat and ice application for pain relief. May also alternate with acetaminophen and Ibuprofen as prescribed pain relief. Other alternatives include massage, acupuncture and water aerobics.  You must stay active and avoid a sedentary lifestyle.  History of gout -     Uric Acid -     predniSONE (DELTASONE) 20 MG tablet; Take 1 tablet (20 mg total) by mouth daily with breakfast for 5 days.     Follow Up Instructions Return in about 4 weeks (around 07/01/2020).     I discussed the assessment and treatment plan with the patient. The patient was provided an opportunity to ask questions and all were answered. The patient agreed with the plan and demonstrated an understanding of the instructions.   The patient was advised to call back or seek an in-person evaluation if the symptoms worsen or if the condition fails to improve as anticipated.  I provided 19 minutes of non-face-to-face time during this encounter including median intraservice time, reviewing previous notes, labs, imaging, medications and explaining diagnosis and management.  Gildardo Pounds, FNP-BC

## 2020-06-04 ENCOUNTER — Other Ambulatory Visit: Payer: Self-pay | Admitting: Nurse Practitioner

## 2020-06-04 ENCOUNTER — Encounter (HOSPITAL_COMMUNITY): Payer: Managed Care, Other (non HMO)

## 2020-06-04 DIAGNOSIS — M7989 Other specified soft tissue disorders: Secondary | ICD-10-CM

## 2020-06-04 DIAGNOSIS — M79605 Pain in left leg: Secondary | ICD-10-CM

## 2020-06-04 NOTE — Progress Notes (Signed)
vva

## 2020-06-09 ENCOUNTER — Other Ambulatory Visit: Payer: Self-pay

## 2020-06-09 ENCOUNTER — Ambulatory Visit (HOSPITAL_COMMUNITY)
Admission: RE | Admit: 2020-06-09 | Discharge: 2020-06-09 | Disposition: A | Discharge status: Home / Self Care | Payer: Managed Care, Other (non HMO) | Source: Ambulatory Visit | Attending: Nurse Practitioner | Admitting: Nurse Practitioner

## 2020-06-09 ENCOUNTER — Telehealth: Payer: Self-pay | Admitting: Nurse Practitioner

## 2020-06-09 DIAGNOSIS — M7989 Other specified soft tissue disorders: Secondary | ICD-10-CM

## 2020-06-09 DIAGNOSIS — M79605 Pain in left leg: Secondary | ICD-10-CM | POA: Diagnosis not present

## 2020-06-09 NOTE — Telephone Encounter (Signed)
[name] from vascular vein specialist called and I returned her call after call dropped.  She called to report no blood clot.test negative. [name] states that she will report result to patient and send him home.  Billy Rankins NP should follow up with patient at home. Will contact office. Spoke with office will route note as instructed for her to forward to Billy Rankins NP.

## 2020-06-09 NOTE — Telephone Encounter (Signed)
PCP is aware of results.

## 2020-06-10 ENCOUNTER — Ambulatory Visit: Payer: Managed Care, Other (non HMO)

## 2020-06-16 ENCOUNTER — Ambulatory Visit: Payer: Managed Care, Other (non HMO) | Attending: Internal Medicine

## 2020-06-16 DIAGNOSIS — Z23 Encounter for immunization: Secondary | ICD-10-CM

## 2020-06-16 NOTE — Progress Notes (Signed)
Covidobs  

## 2020-06-16 NOTE — Progress Notes (Signed)
   Covid-19 Vaccination Clinic  Name:  [name]    MRN: [id number] DOB: June 21, 1971  06/16/2020  [name] was observed post Covid-19 immunization for 15 minutes without incident. He was provided with Vaccine Information Sheet and instruction to access the V-Safe system.   [name] was instructed to call 911 with any severe reactions post vaccine: Marland Kitchen Difficulty breathing  . Swelling of face and throat  . A fast heartbeat  . A bad rash all over body  . Dizziness and weakness   Immunizations Administered    Name Date Dose VIS Date Route   Pfizer COVID-19 Vaccine 06/16/2020  4:49 PM 0.3 mL 04/30/2020 Intramuscular   Manufacturer: [place]   Lot: [id number]   [place]: [id number]

## 2020-06-18 ENCOUNTER — Other Ambulatory Visit: Payer: Self-pay

## 2020-06-18 ENCOUNTER — Encounter (HOSPITAL_COMMUNITY): Payer: Self-pay | Admitting: Gastroenterology

## 2020-06-19 ENCOUNTER — Telehealth: Payer: Self-pay | Admitting: *Deleted

## 2020-06-19 NOTE — Telephone Encounter (Signed)
   [facility] Pre-operative Risk Assessment    HEARTCARE STAFF: - Please ensure there is not already an duplicate clearance open for this procedure. - Under Visit Info/Reason for Call, type in Other and utilize the format Clearance MM/DD/YY or Clearance TBD. Do not use dashes or single digits. - If request is for dental extraction, please clarify the # of teeth to be extracted.  Request for surgical clearance:  1. What type of surgery is being performed? EUS   2. When is this surgery scheduled? 06/25/20   3. What type of clearance is required (medical clearance vs. Pharmacy clearance to hold med vs. Both)? MEDICAL  4. Are there any medications that need to be held prior to surgery and how long? ASA    5. Practice name and name of physician performing surgery? [facility]; DR. Gwyndolyn Saxon OUTLAW   6. What is the office phone number? [phone]   7.   What is the office fax number? [phone]  8.   Anesthesia type (None, local, MAC, general) ? NOT LISTED   Julaine Hua 06/19/2020, 4:00 PM  _________________________________________________________________   (provider comments below)

## 2020-06-20 MED FILL — AMIODARONE HCL 200 MG TAB: 200 | 30 days supply | Qty: 30 | Fill #4

## 2020-06-20 NOTE — Telephone Encounter (Signed)
   Primary Cardiologist: Cristopher Peru, MD  Chart reviewed as part of pre-operative protocol coverage. Patient was last seen by Dr. Lovena Le in 04/2020. Patient was contacted today for further pre-op evaluation and reported doing well since last visit. He denied any chest pain, shortness of breath, palpitations, lightheadedness, dizziness, syncope, orthopnea, PND, edema. No ICD shocks. He is able to complete >4.0 METS without any problems. Given past medical history and time since last visit, based on ACC/AHA guidelines, [name] would be at acceptable risk for the planned procedure without further cardiovascular testing.   Patient takes Aspirin as primary preventions. He has no history of MI, PCI, or stroke. Therefore, OK to hold Aspirin if needed prior to procedure. Would restart this as soon as able following procedure.   I will route this recommendation to the requesting party via Epic fax function and remove from pre-op pool.  Please call with questions.  Darreld Mclean, PA-C 06/20/2020, 9:04 AM

## 2020-06-24 ENCOUNTER — Other Ambulatory Visit (HOSPITAL_COMMUNITY)
Admission: RE | Admit: 2020-06-24 | Discharge: 2020-06-24 | Disposition: A | Discharge status: Home / Self Care | Payer: Managed Care, Other (non HMO) | Source: Ambulatory Visit | Attending: Gastroenterology | Admitting: Gastroenterology

## 2020-06-24 ENCOUNTER — Other Ambulatory Visit: Payer: Self-pay | Admitting: Internal Medicine

## 2020-06-24 DIAGNOSIS — I4901 Ventricular fibrillation: Secondary | ICD-10-CM

## 2020-06-24 DIAGNOSIS — Z20822 Contact with and (suspected) exposure to covid-19: Secondary | ICD-10-CM | POA: Insufficient documentation

## 2020-06-24 DIAGNOSIS — Z01812 Encounter for preprocedural laboratory examination: Secondary | ICD-10-CM | POA: Insufficient documentation

## 2020-06-24 DIAGNOSIS — I5022 Chronic systolic (congestive) heart failure: Secondary | ICD-10-CM

## 2020-06-24 DIAGNOSIS — I10 Essential (primary) hypertension: Secondary | ICD-10-CM

## 2020-06-24 LAB — SARS CORONAVIRUS 2 (TAT 6-24 HRS): SARS Coronavirus 2: NEGATIVE

## 2020-06-25 ENCOUNTER — Encounter (HOSPITAL_COMMUNITY)
Admission: RE | Disposition: A | Discharge status: Home / Self Care | Payer: Self-pay | Source: Home / Self Care | Attending: Gastroenterology

## 2020-06-25 ENCOUNTER — Ambulatory Visit (HOSPITAL_COMMUNITY): Payer: Managed Care, Other (non HMO) | Admitting: Certified Registered"

## 2020-06-25 ENCOUNTER — Other Ambulatory Visit: Payer: Self-pay

## 2020-06-25 ENCOUNTER — Inpatient Hospital Stay: Payer: Managed Care, Other (non HMO) | Admitting: Physician Assistant

## 2020-06-25 ENCOUNTER — Encounter (HOSPITAL_COMMUNITY): Payer: Self-pay | Admitting: Gastroenterology

## 2020-06-25 ENCOUNTER — Ambulatory Visit (HOSPITAL_COMMUNITY)
Admission: RE | Admit: 2020-06-25 | Discharge: 2020-06-25 | Disposition: A | Discharge status: Home / Self Care | Payer: Managed Care, Other (non HMO) | Attending: Gastroenterology | Admitting: Gastroenterology

## 2020-06-25 DIAGNOSIS — Z7982 Long term (current) use of aspirin: Secondary | ICD-10-CM | POA: Insufficient documentation

## 2020-06-25 DIAGNOSIS — R1909 Other intra-abdominal and pelvic swelling, mass and lump: Secondary | ICD-10-CM | POA: Diagnosis present

## 2020-06-25 DIAGNOSIS — K8020 Calculus of gallbladder without cholecystitis without obstruction: Secondary | ICD-10-CM | POA: Diagnosis not present

## 2020-06-25 DIAGNOSIS — I509 Heart failure, unspecified: Secondary | ICD-10-CM | POA: Insufficient documentation

## 2020-06-25 DIAGNOSIS — Z8674 Personal history of sudden cardiac arrest: Secondary | ICD-10-CM | POA: Diagnosis not present

## 2020-06-25 DIAGNOSIS — Z888 Allergy status to other drugs, medicaments and biological substances status: Secondary | ICD-10-CM | POA: Diagnosis not present

## 2020-06-25 DIAGNOSIS — Z95810 Presence of automatic (implantable) cardiac defibrillator: Secondary | ICD-10-CM | POA: Insufficient documentation

## 2020-06-25 DIAGNOSIS — Z8249 Family history of ischemic heart disease and other diseases of the circulatory system: Secondary | ICD-10-CM | POA: Insufficient documentation

## 2020-06-25 DIAGNOSIS — Z825 Family history of asthma and other chronic lower respiratory diseases: Secondary | ICD-10-CM | POA: Diagnosis not present

## 2020-06-25 DIAGNOSIS — Z7951 Long term (current) use of inhaled steroids: Secondary | ICD-10-CM | POA: Diagnosis not present

## 2020-06-25 DIAGNOSIS — I110 Hypertensive heart disease with heart failure: Secondary | ICD-10-CM | POA: Diagnosis not present

## 2020-06-25 DIAGNOSIS — Z79899 Other long term (current) drug therapy: Secondary | ICD-10-CM | POA: Diagnosis not present

## 2020-06-25 DIAGNOSIS — D490 Neoplasm of unspecified behavior of digestive system: Secondary | ICD-10-CM | POA: Insufficient documentation

## 2020-06-25 DIAGNOSIS — I358 Other nonrheumatic aortic valve disorders: Secondary | ICD-10-CM | POA: Diagnosis not present

## 2020-06-25 DIAGNOSIS — Z833 Family history of diabetes mellitus: Secondary | ICD-10-CM | POA: Diagnosis not present

## 2020-06-25 HISTORY — PX: FINE NEEDLE ASPIRATION: SHX5430

## 2020-06-25 HISTORY — PX: UPPER ESOPHAGEAL ENDOSCOPIC ULTRASOUND (EUS): SHX6562

## 2020-06-25 HISTORY — DX: Presence of automatic (implantable) cardiac defibrillator: Z95.810

## 2020-06-25 HISTORY — PX: ESOPHAGOGASTRODUODENOSCOPY (EGD) WITH PROPOFOL: SHX5813

## 2020-06-25 HISTORY — DX: Personal history of urinary calculi: Z87.442

## 2020-06-25 SURGERY — UPPER ESOPHAGEAL ENDOSCOPIC ULTRASOUND (EUS)
Anesthesia: Monitor Anesthesia Care

## 2020-06-25 MED ORDER — PROPOFOL 500 MG/50ML IV EMUL
INTRAVENOUS | Status: DC | PRN
  Administered 2020-06-25: 125 ug/kg/min via INTRAVENOUS

## 2020-06-25 MED ORDER — CIPROFLOXACIN IN D5W 400 MG/200ML IV SOLN
INTRAVENOUS | Status: DC | PRN
  Administered 2020-06-25: 400 mg via INTRAVENOUS

## 2020-06-25 MED ORDER — CIPROFLOXACIN IN D5W 400 MG/200ML IV SOLN
INTRAVENOUS | Status: AC
  Filled 2020-06-25: qty 200

## 2020-06-25 MED ORDER — BISOPROLOL FUMARATE 10 MG PO TABS: 10.0000 mg | ORAL_TABLET | Freq: Every day | ORAL | 3 refills | Status: DC

## 2020-06-25 MED ORDER — SODIUM CHLORIDE 0.9 % IV SOLN: INTRAVENOUS | Status: DC

## 2020-06-25 MED ORDER — EPHEDRINE SULFATE-NACL 50-0.9 MG/10ML-% IV SOSY
PREFILLED_SYRINGE | INTRAVENOUS | Status: DC | PRN
  Administered 2020-06-25 (×3): 10 mg via INTRAVENOUS

## 2020-06-25 MED ORDER — ENTRESTO 24-26 MG PO TABS: 1.0000 | ORAL_TABLET | Freq: Two times a day (BID) | ORAL | 3 refills | Status: AC

## 2020-06-25 MED ORDER — PROPOFOL 1000 MG/100ML IV EMUL
INTRAVENOUS | Status: AC
  Filled 2020-06-25: qty 100

## 2020-06-25 MED ORDER — LACTATED RINGERS IV SOLN: INTRAVENOUS | Status: DC

## 2020-06-25 MED ORDER — CIPROFLOXACIN HCL 500 MG PO TABS: 500.0000 mg | ORAL_TABLET | Freq: Two times a day (BID) | ORAL | 0 refills | Status: DC

## 2020-06-25 MED ORDER — PROPOFOL 10 MG/ML IV BOLUS
INTRAVENOUS | Status: DC | PRN
  Administered 2020-06-25 (×4): 20 mg via INTRAVENOUS
  Administered 2020-06-25: 10 mg via INTRAVENOUS

## 2020-06-25 MED ORDER — LIDOCAINE 2% (20 MG/ML) 5 ML SYRINGE
INTRAMUSCULAR | Status: DC | PRN
  Administered 2020-06-25: 100 mg via INTRAVENOUS

## 2020-06-25 SURGICAL SUPPLY — 15 items

## 2020-06-25 NOTE — Op Note (Addendum)
[facility] Patient Name: [name] Procedure Date: 06/25/2020 MRN: [id number] Attending MD: Arta Silence , MD Date of Birth: 1970/11/30 CSN: [id number] Age: 49 Admit Type: Outpatient Procedure:                Upper EUS Indications:              Mucosal mass/polyp found on endoscopy, Abnormal                            abdominal/pelvic CT scan Providers:                Arta Silence, MD, Cleda Daub, RN, Jeanella Cara, RN, Tyrone Apple, Technician,                            Laureen Abrahams, Jefm Miles CRNA Referring MD:              Medicines:                Monitored Anesthesia Care, Cipro 026 mg IV Complications:            No immediate complications. Estimated Blood Loss:     Estimated blood loss was minimal. Procedure:                Pre-Anesthesia Assessment:                           - Prior to the procedure, a History and Physical                            was performed, and patient medications and                            allergies were reviewed. The patient's tolerance of                            previous anesthesia was also reviewed. The risks                            and benefits of the procedure and the sedation                            options and risks were discussed with the patient.                            All questions were answered, and informed consent                            was obtained. Prior Anticoagulants: The patient has                            taken no previous anticoagulant or antiplatelet  agents. ASA Grade Assessment: III - A patient with                            severe systemic disease. After reviewing the risks                            and benefits, the patient was deemed in                            satisfactory condition to undergo the procedure.                           After obtaining informed consent, the endoscope was                             passed under direct vision. Throughout the                            procedure, the patient's blood pressure, pulse, and                            oxygen saturations were monitored continuously. The                            [id number]-[id number] ([id number]) Olympus Radial EUS was                            introduced through the mouth, and advanced to the                            second part of duodenum. The [id number] ([id number])                            Olympus Linear EUS was introduced through the                            mouth, and advanced to the second part of duodenum.                            The upper EUS was accomplished without difficulty.                            The patient tolerated the procedure well. Scope In: Scope Out: Findings:      ENDOSONOGRAPHIC FINDING: :      There was no sign of significant endosonographic abnormality in the       ampulla.      There was no sign of significant endosonographic abnormality in the       common bile duct and in the common hepatic duct. The maximum diameter of       the ducts were 5 mm.      Many stones were visualized endosonographically in the gallbladder. The       stones were triangular. They were characterized by shadowing.      No lymphadenopathy seen.  Endosonographic imaging of the pancreas showed sonographic changes       indicative of mild chronic pancreatitis in the pancreatic head, genu of       the pancreas and pancreatic body. The parenchyma had hyperechoic       strands, hyperechoic foci and lobularity.      There was extrinsic compression in the third portion of the duodenum.       Endosonographic examination showed this to be due to a mass, measuring       4cm x 4cm in size; not clearly part of either the pancreas or the       duodenal wall; suspect mesenteric lesion; there were other hypoechoic       scattered spaces, and hypervascular. Fine needle aspiration for cytology       was performed. Color  Doppler imaging was utilized prior to needle       puncture to confirm a lack of significant vascular structures within the       needle path. Four passes were made with the 22 gauge needle using a       transduodenal approach. A stylet was used. A preliminary cytologic       examination was not performed. Final cytology results are pending. Impression:               - There was no sign of significant pathology in the                            ampulla.                           - There was no sign of significant pathology in the                            common bile duct and in the common hepatic duct.                           - Many stones were visualized endosonographically                            in the gallbladder.                           - Endosonographic imaging of the pancreas showed                            sonographic changes indeterminate for mild chronic                            pancreatitis.                           - Extrinsic compression was noted in the third                            portion of the duodenum due to a 4cm x 4cm                            mesenteric-appearing lesion (not seemingly  part of                            the duodenal wall or the pancreas). Most consistent                            with GIST. Fine needle aspiration performed. Moderate Sedation:      None Recommendation:           - Discharge patient to home (ambulatory).                           - Resume previous diet today.                           - Continue present medications.                           - Await cytology results.                           - Cipro 500 mg po bid x 3 days.                           - Return to GI clinic PRN.                           - Refer to a surgeon at appointment to be scheduled. Procedure Code(s):        --- Professional ---                           [phone], Esophagogastroduodenoscopy, flexible,                            transoral; with  transendoscopic ultrasound-guided                            intramural or transmural fine needle                            aspiration/biopsy(s) (includes endoscopic                            ultrasound examination of the esophagus, stomach,                            and either the duodenum or a surgically altered                            stomach where the jejunum is examined distal to the                            anastomosis) Diagnosis Code(s):        --- Professional ---                           K80.20, Calculus of gallbladder without  cholecystitis without obstruction                           K56.699, Other intestinal obstruction unspecified                            as to partial versus complete obstruction                           K92.9, Disease of digestive system, unspecified                           R93.3, Abnormal findings on diagnostic imaging of                            other parts of digestive tract                           R93.5, Abnormal findings on diagnostic imaging of                            other abdominal regions, including retroperitoneum CPT copyright 2019 American Medical Association. All rights reserved. The codes documented in this report are preliminary and upon coder review may  be revised to meet current compliance requirements. Arta Silence, MD 06/25/2020 12:26:25 PM This report has been signed electronically. Number of Addenda: 0

## 2020-06-25 NOTE — Transfer of Care (Signed)
Immediate Anesthesia Transfer of Care Note  Patient: [name]  Procedure(s) Performed: UPPER ESOPHAGEAL ENDOSCOPIC ULTRASOUND (EUS) (N/A ) FINE NEEDLE ASPIRATION (FNA) LINEAR (N/A ) ESOPHAGOGASTRODUODENOSCOPY (EGD) WITH PROPOFOL (N/A )  Patient Location: PACU and Endoscopy Unit  Anesthesia Type:MAC  Level of Consciousness: awake, alert  and patient cooperative  Airway & Oxygen Therapy: Patient Spontanous Breathing and Patient connected to face mask oxygen  Post-op Assessment: Report given to RN and Post -op Vital signs reviewed and stable  Post vital signs: Reviewed and stable  Last Vitals:  Vitals Value Taken Time  BP    Temp    Pulse    Resp    SpO2      Last Pain:  Vitals:   06/25/20 1005  TempSrc: Oral  PainSc: 0-No pain         Complications: No complications documented.

## 2020-06-25 NOTE — Anesthesia Postprocedure Evaluation (Signed)
Anesthesia Post Note  Patient: [name]  Procedure(s) Performed: UPPER ESOPHAGEAL ENDOSCOPIC ULTRASOUND (EUS) (N/A ) FINE NEEDLE ASPIRATION (FNA) LINEAR (N/A ) ESOPHAGOGASTRODUODENOSCOPY (EGD) WITH PROPOFOL (N/A )     Patient location during evaluation: PACU Anesthesia Type: MAC Level of consciousness: awake and alert Pain management: pain level controlled Vital Signs Assessment: post-procedure vital signs reviewed and stable Respiratory status: spontaneous breathing, nonlabored ventilation and respiratory function stable Cardiovascular status: blood pressure returned to baseline and stable Postop Assessment: no apparent nausea or vomiting Anesthetic complications: no   No complications documented.  Last Vitals:  Vitals:   06/25/20 1230 06/25/20 1240  BP: 101/62 (!) 106/45  Pulse: (!) 48 (!) 48  Resp: (!) 21 20  Temp:    SpO2: 100% 95%    Last Pain:  Vitals:   06/25/20 1240  TempSrc:   PainSc: 0-No pain                 Pervis Hocking

## 2020-06-25 NOTE — Anesthesia Preprocedure Evaluation (Addendum)
Anesthesia Evaluation  Patient identified by MRN, date of birth, ID band Patient awake    Reviewed: Allergy & Precautions, H&P , NPO status , Patient's Chart, lab work & pertinent test results, reviewed documented beta blocker date and time   Airway Mallampati: II  TM Distance: >3 FB Neck ROM: Full    Dental  (+) Teeth Intact, Dental Advisory Given   Pulmonary asthma , former smoker,  Quit smoking 2000, previously light smoker    Pulmonary exam normal breath sounds clear to auscultation       Cardiovascular hypertension, Pt. on medications and Pt. on home beta blockers +CHF (LVEF 39-03%, grade 3 diastolic dysfunction)  Normal cardiovascular exam+ dysrhythmias (hx Vfib arrest s/p AICD placement, subsequent NICM) Ventricular Fibrillation + Cardiac Defibrillator  Rhythm:Regular Rate:Normal  Last echo 05/2020: 1. Left ventricular ejection fraction, by estimation, is 45 to 50%. The  left ventricle has mildly decreased function. The left ventricle  demonstrates global hypokinesis. The left ventricular internal cavity size  was mildly dilated. There is mild  concentric left ventricular hypertrophy. Left ventricular diastolic  parameters are consistent with Grade III diastolic dysfunction  (restrictive). Elevated left ventricular end-diastolic pressure.  2. Right ventricular systolic function is normal. The right ventricular  size is normal. There is normal pulmonary artery systolic pressure. The  estimated right ventricular systolic pressure is 00.9 mmHg.  3. Left atrial size was moderately dilated.  4. The mitral valve is normal in structure. Trivial mitral valve  regurgitation. No evidence of mitral stenosis.  5. The aortic valve is normal in structure. Aortic valve regurgitation is  not visualized. Mild aortic valve sclerosis is present, with no evidence  of aortic valve stenosis.  6. The inferior vena cava is normal in size  with greater than 50%  respiratory variability, suggesting right atrial pressure of 3 mmHg.    Neuro/Psych negative neurological ROS  negative psych ROS   GI/Hepatic Neg liver ROS, Pancreatic lesion    Endo/Other  Obesity BMI 30  Renal/GU negative Renal ROS  negative genitourinary   Musculoskeletal negative musculoskeletal ROS (+)   Abdominal   Peds negative pediatric ROS (+)  Hematology negative hematology ROS (+)   Anesthesia Other Findings   Reproductive/Obstetrics negative OB ROS                            Anesthesia Physical Anesthesia Plan  ASA: III  Anesthesia Plan: MAC   Post-op Pain Management:    Induction:   PONV Risk Score and Plan: 2 and Propofol infusion and TIVA  Airway Management Planned: Natural Airway and Simple Face Mask  Additional Equipment: None  Intra-op Plan:   Post-operative Plan:   Informed Consent: I have reviewed the patients History and Physical, chart, labs and discussed the procedure including the risks, benefits and alternatives for the proposed anesthesia with the patient or authorized representative who has indicated his/her understanding and acceptance.       Plan Discussed with: CRNA  Anesthesia Plan Comments: (Will have magnet available in room )        Anesthesia Quick Evaluation

## 2020-06-25 NOTE — H&P (Signed)
[facility] Admission Note  Chief Complaint: lesion on CT scan  HPI: [name] is an 49 y.o. male.  Presenting for evaluation of lesion seen on CT scan.  Prior admission for gallstones.  He has some bloating but no pain on bland low-fat diet.  Past Medical History:  Diagnosis Date  . AICD (automatic cardioverter/defibrillator) present   . Dyslipidemia   . Heart failure with mildly reduced EF    EF 35-40 in 2011 // Echo 06/2019: EF 45-50, Gr 2 DD, RAE, mild MR, mild TR, mild PI, RVSP 33.6  . History of kidney stones   . HTN (hypertension)   . Hx of ventricular fibrillation    Hx of VFib arrest s/p AICD placement  . Nonischemic cardiomyopathy     Past Surgical History:  Procedure Laterality Date  . CARDIAC DEFIBRILLATOR PLACEMENT    . ERCP N/A 05/27/2020   Procedure: ENDOSCOPIC RETROGRADE CHOLANGIOPANCREATOGRAPHY (ERCP);  Surgeon: Clarene Essex, MD;  Location: [place];  Service: Endoscopy;  Laterality: N/A;  . PACEMAKER IMPLANT    . REMOVAL OF STONES  05/27/2020   Procedure: REMOVAL OF STONES;  Surgeon: Clarene Essex, MD;  Location: [facility] ENDOSCOPY;  Service: Endoscopy;;  . repeat echo  2008    demonstrated near normalization  . SPHINCTEROTOMY  05/27/2020   Procedure: SPHINCTEROTOMY;  Surgeon: Clarene Essex, MD;  Location: [facility] ENDOSCOPY;  Service: Endoscopy;;  . TRANSTHORACIC ECHOCARDIOGRAM  11/09/2005, 01/15/2009, 02/07/2010    Medications Prior to Admission  Medication Sig Dispense Refill  . amiodarone (PACERONE) 200 MG tablet Take 1 tablet (200 mg total) by mouth daily. 90 tablet 1  . aspirin 81 MG tablet Take 1 tablet (81 mg total) by mouth 2 (two) times daily. 30 tablet 11  . bisoprolol (ZEBETA) 10 MG tablet Take 1 tablet (10 mg total) by mouth daily. 90 tablet 1  . sacubitril-valsartan (ENTRESTO) 24-26 MG Take 1 tablet by mouth 2 (two) times daily. 180 tablet 1  . budesonide-formoterol (SYMBICORT) 80-4.5 MCG/ACT inhaler Inhale 2 puffs into the lungs 2 (two) times  daily. Must keep upcoming office visit for refills (Patient taking differently: Inhale 2 puffs into the lungs 2 (two) times daily as needed (for flares).) 1 Inhaler 0    Allergies:  Allergies  Allergen Reactions  . Ace Inhibitors Cough    Family History  Problem Relation Age of Onset  . Diabetes Father   . Heart Problems Father        unsure of what kind  . Asthma Maternal Uncle   . Diabetes Maternal Grandmother     Social History:  reports that he quit smoking about 21 years ago. His smoking use included cigarettes. He has a 0.50 pack-year smoking history. He has never used smokeless tobacco. He reports that he does not drink alcohol and does not use drugs.   ROS: As per HPI, all others negative   Blood pressure 121/63, pulse (!) 44, temperature 97.7 F (36.5 C), temperature source Oral, resp. rate (!) 22, height 6\' 2"  (1.88 m), weight 107 kg, SpO2 96 %. General appearance: Overweight, NAD HEENT:  NCAT, anicteric sclera NEURO:  Non-focal without lateralizing signs. ABD:  Protuberant, soft, non-tender  Results for orders placed or performed during the hospital encounter of 06/24/20 (from the past 48 hour(s))  SARS CORONAVIRUS 2 (TAT 6-24 HRS) Nasopharyngeal Nasopharyngeal Swab     Status: None   Collection Time: 06/24/20  3:28 PM   Specimen: Nasopharyngeal Swab  Result Value Ref Range   SARS Coronavirus  2 NEGATIVE NEGATIVE    Comment: (NOTE) SARS-CoV-2 target nucleic acids are NOT DETECTED.  The SARS-CoV-2 RNA is generally detectable in upper and lower respiratory specimens during the acute phase of infection. Negative results do not preclude SARS-CoV-2 infection, do not rule out co-infections with other pathogens, and should not be used as the sole basis for treatment or other patient management decisions. Negative results must be combined with clinical observations, patient history, and epidemiological information. The expected result is Negative.  Fact Sheet for  Patients: SugarRoll.be  Fact Sheet for Healthcare Providers: https://www.woods-mathews.com/  This test is not yet approved or cleared by the [place] FDA and  has been authorized for detection and/or diagnosis of SARS-CoV-2 by FDA under an Emergency Use Authorization (EUA). This EUA will remain  in effect (meaning this test can be used) for the duration of the COVID-19 declaration under Se ction 564(b)(1) of the Act, 21 U.S.C. section 360bbb-3(b)(1), unless the authorization is terminated or revoked sooner.  Performed at [facility], [place] [address], [place], [place] [address]    No results found.  Assessment/Plan  1.  Pancreatic vs duodenal vs mesenteric lesion seen on CT scan. 2.  Symptomatic cholelithiasis, with suspected passed CBD stone, prior hospitalization. 3.  History ventricular fibrillation, with AICD. 4.  Plan for endoscopic ultrasound with possible FNA of lesion seen on CT scan. 5.  Risks (bleeding, infection, bowel perforation that could require surgery, sedation-related changes in cardiopulmonary systems), benefits (identification and possible treatment of source of symptoms, exclusion of certain causes of symptoms), and alternatives (watchful waiting, radiographic imaging studies, empiric medical treatment) of upper endoscopy with ultrasound and possible fine needle aspiration (EUS +/- FNA) were explained to patient/family in detail and patient wishes to proceed.  Landry Dyke 06/25/2020, 11:01 AM

## 2020-06-25 NOTE — Discharge Instructions (Signed)

## 2020-06-25 NOTE — Anesthesia Procedure Notes (Signed)
Procedure Name: MAC Date/Time: 06/25/2020 11:18 AM Performed by: Eben Burow, CRNA Pre-anesthesia Checklist: Patient identified, Emergency Drugs available, Suction available, Patient being monitored and Timeout performed Oxygen Delivery Method: Simple face mask Placement Confirmation: positive ETCO2

## 2020-06-27 ENCOUNTER — Encounter (HOSPITAL_COMMUNITY): Payer: Self-pay | Admitting: Gastroenterology

## 2020-06-29 ENCOUNTER — Ambulatory Visit (HOSPITAL_COMMUNITY)
Admission: EM | Admit: 2020-06-29 | Discharge: 2020-06-29 | Disposition: A | Discharge status: Home / Self Care | Payer: Managed Care, Other (non HMO) | Attending: Emergency Medicine | Admitting: Emergency Medicine

## 2020-06-29 ENCOUNTER — Encounter (HOSPITAL_COMMUNITY): Payer: Self-pay

## 2020-06-29 DIAGNOSIS — M79671 Pain in right foot: Secondary | ICD-10-CM | POA: Diagnosis not present

## 2020-06-29 MED ORDER — PREDNISONE 10 MG (21) PO TBPK: ORAL_TABLET | ORAL | 0 refills | Status: DC

## 2020-06-29 NOTE — ED Provider Notes (Signed)
HPI  SUBJECTIVE:  [name] is a 49 y.o. male who presents with recurrent right foot and ankle swelling and pain has been going on since 2020.  This began again yesterday.  Reports hypersensitivity.  He states it is usually treated successfully with prednisone.  No calf swelling, knee swelling.  No fevers, body aches, foot or ankle erythema, trauma, change in his physical activity.  No recent change in his medications, no change in his diet.  Denies shellfish, red meat, alcohol intake.  States that he has not been able to bear weight on this due to pain and reports limitation of motion due to pain.  He states is identical to previous episodes of right foot pain and swelling.  He tried aspirin 81 mg without improvement in symptoms.  Symptoms are worse with walking, moving his ankle. Patient has a past medical history of CHF, hypertension, V. fib status post AICD placement.  He has a history of elevated uric acid levels per chart review, but no formal diagnosis of gout.  He has been seen here multiple times for similar symptoms in the past.  It has never been thought to be infection.  OZH:[id number], Vernia Buff, NP  Had negative DVT ultrasound of the left lower extremity on 11/20  Past Medical History:  Diagnosis Date  . AICD (automatic cardioverter/defibrillator) present   . Dyslipidemia   . Heart failure with mildly reduced EF    EF 35-40 in 2011 // Echo 06/2019: EF 45-50, Gr 2 DD, RAE, mild MR, mild TR, mild PI, RVSP 33.6  . History of kidney stones   . HTN (hypertension)   . Hx of ventricular fibrillation    Hx of VFib arrest s/p AICD placement  . Nonischemic cardiomyopathy     Past Surgical History:  Procedure Laterality Date  . CARDIAC DEFIBRILLATOR PLACEMENT    . ERCP N/A 05/27/2020   Procedure: ENDOSCOPIC RETROGRADE CHOLANGIOPANCREATOGRAPHY (ERCP);  Surgeon: Clarene Essex, MD;  Location: [place];  Service: Endoscopy;  Laterality: N/A;  . ESOPHAGOGASTRODUODENOSCOPY (EGD) WITH  PROPOFOL N/A 06/25/2020   Procedure: ESOPHAGOGASTRODUODENOSCOPY (EGD) WITH PROPOFOL;  Surgeon: Arta Silence, MD;  Location: [facility] ENDOSCOPY;  Service: Endoscopy;  Laterality: N/A;  . FINE NEEDLE ASPIRATION N/A 06/25/2020   Procedure: FINE NEEDLE ASPIRATION (FNA) LINEAR;  Surgeon: Arta Silence, MD;  Location: [facility] ENDOSCOPY;  Service: Endoscopy;  Laterality: N/A;  . PACEMAKER IMPLANT    . REMOVAL OF STONES  05/27/2020   Procedure: REMOVAL OF STONES;  Surgeon: Clarene Essex, MD;  Location: [facility] ENDOSCOPY;  Service: Endoscopy;;  . repeat echo  2008    demonstrated near normalization  . SPHINCTEROTOMY  05/27/2020   Procedure: SPHINCTEROTOMY;  Surgeon: Clarene Essex, MD;  Location: [facility] ENDOSCOPY;  Service: Endoscopy;;  . TRANSTHORACIC ECHOCARDIOGRAM  11/09/2005, 01/15/2009, 02/07/2010  . UPPER ESOPHAGEAL ENDOSCOPIC ULTRASOUND (EUS) N/A 06/25/2020   Procedure: UPPER ESOPHAGEAL ENDOSCOPIC ULTRASOUND (EUS);  Surgeon: Arta Silence, MD;  Location: [facility];  Service: Endoscopy;  Laterality: N/A;    Family History  Problem Relation Age of Onset  . Diabetes Father   . Heart Problems Father        unsure of what kind  . Asthma Maternal Uncle   . Diabetes Maternal Grandmother     Social History   Tobacco Use  . Smoking status: Former Smoker    Packs/day: 0.25    Years: 2.00    Pack years: 0.50    Types: Cigarettes    Quit date: 07/12/1998    Years since  quitting: 21.9  . Smokeless tobacco: Never Used  Vaping Use  . Vaping Use: Never used  Substance Use Topics  . Alcohol use: No    Comment: Denies  . Drug use: No    No current facility-administered medications for this encounter.  Current Outpatient Medications:  .  amiodarone (PACERONE) 200 MG tablet, Take 1 tablet (200 mg total) by mouth daily., Disp: 90 tablet, Rfl: 1 .  aspirin 81 MG tablet, Take 1 tablet (81 mg total) by mouth 2 (two) times daily., Disp: 30 tablet, Rfl: 11 .  bisoprolol (ZEBETA) 10 MG tablet, Take 1 tablet (10 mg  total) by mouth daily., Disp: 90 tablet, Rfl: 3 .  budesonide-formoterol (SYMBICORT) 80-4.5 MCG/ACT inhaler, Inhale 2 puffs into the lungs 2 (two) times daily. Must keep upcoming office visit for refills (Patient taking differently: Inhale 2 puffs into the lungs 2 (two) times daily as needed (for flares).), Disp: 1 Inhaler, Rfl: 0 .  ciprofloxacin (CIPRO) 500 MG tablet, Take 1 tablet (500 mg total) by mouth 2 (two) times daily., Disp: 6 tablet, Rfl: 0 .  predniSONE (STERAPRED UNI-PAK 21 TAB) 10 MG (21) TBPK tablet, Dispense one 6 day pack. Take as directed with food., Disp: 21 tablet, Rfl: 0 .  sacubitril-valsartan (ENTRESTO) 24-26 MG, Take 1 tablet by mouth 2 (two) times daily., Disp: 180 tablet, Rfl: 3  Allergies  Allergen Reactions  . Ace Inhibitors Cough     ROS  As noted in HPI.   Physical Exam  BP (!) 105/55 (BP Location: Left Arm)   Pulse (!) 47   Temp 98.2 F (36.8 C) (Oral)   Resp 20   SpO2 100%   Constitutional: Well developed, well nourished, no acute distress Eyes:  EOMI, conjunctiva normal bilaterally HENT: Normocephalic, atraumatic,mucus membranes moist Respiratory: Normal inspiratory effort Cardiovascular: Normal rate GI: nondistended skin: No rash, skin intact Musculoskeletal: Mild swelling, increased temperature dorsum of right foot.  Tenderness over the midfoot, base of fifth metatarsal.  No tenderness over the rest of the metatarsals.  Pain with all range of motion of the ankle.  No tenderness along the Achilles.  No tenderness at the calcaneus.  No tenderness over the distal fibula, medial malleolus.  No tenderness over the medial or lateral ligaments.  DP 2+.  Skin intact.  Sensation to light touch and temperature intact Neurologic: Alert & oriented x 3, no focal neuro deficits Psychiatric: Speech and behavior appropriate   ED Course   Medications - No data to display  No orders of the defined types were placed in this encounter.   No results found  for this or any previous visit (from the past 24 hour(s)). No results found.  ED Clinical Impression  1. Right foot pain      ED Assessment/Plan  Previous labs, medical records and ultrasound report reviewed.  As noted in HPI  Presentation consistent with gout.  In the differential is septic joint, but think this is less likely in the absence of systemic symptoms, erythema.  He also states that this is a recurrent and not a new issue, and it usually resolves with prednisone.  No evidence of DVT.  Deferred imaging in the absence of trauma.  Creatinine clearance based on labs from November 21 99 mL/min.   Patient has had elevated uric acid in the past, last at 7.8 in May 21  Patient's baseline creatinine is around 1.4 as of last month-improving.  However with holding NSAIDs.  Home with prednisone taper.  Deferring colchicine due to interaction with his amiodarone , may take Tylenol 1000 mg 3-4 times a day.  Elevate.  Follow-up with [facility] if not getting better in a day or 2.  To the ER for fevers, systemic symptoms, pain not controlled with Tylenol, or other concerns.  Discussed  MDM, treatment plan, and plan for follow-up with patient. Discussed sn/sx that should prompt return to the ED. patient agrees with plan.   Meds ordered this encounter  Medications  . predniSONE (STERAPRED UNI-PAK 21 TAB) 10 MG (21) TBPK tablet    Sig: Dispense one 6 day pack. Take as directed with food.    Dispense:  21 tablet    Refill:  0    *This clinic note was created using Lobbyist. Therefore, there may be occasional mistakes despite careful proofreading.   ?    Melynda Ripple, MD 06/30/20 [id number]

## 2020-06-29 NOTE — Discharge Instructions (Addendum)
I suspect that this is gout.  Take the prednisone.  You can try Voltaren topical gel.  It takes several days for it to work, so keep applying it. I am unable to give you colchicine because it interacts with 1 your medications.  You may take 1000 mg of Tylenol 3 or 4 times a day as needed for pain,.  Follow-up with [facility] if you are not getting better in a day or 2.  Go to the ER for fevers, pain not controlled with Tylenol, or any other concerns.

## 2020-06-29 NOTE — ED Triage Notes (Signed)
Pt in with c/o right ankle/leg swelling that started a few days ago.States that he is not able to put pressure on his foot at all. Pt able to plantar and dorsiflex but states it is painful  Pt has tried to use bengay with no relief  Denies any injury or falls

## 2020-06-30 LAB — CYTOLOGY - NON PAP

## 2020-07-03 ENCOUNTER — Ambulatory Visit: Payer: Managed Care, Other (non HMO) | Attending: Physician Assistant | Admitting: Internal Medicine

## 2020-07-03 ENCOUNTER — Encounter: Payer: Self-pay | Admitting: Internal Medicine

## 2020-07-03 ENCOUNTER — Inpatient Hospital Stay: Payer: Managed Care, Other (non HMO) | Admitting: Family Medicine

## 2020-07-03 ENCOUNTER — Other Ambulatory Visit: Payer: Self-pay

## 2020-07-03 VITALS — BP 112/74 | HR 48 | Temp 98.2°F | Resp 16 | Wt 226.0 lb

## 2020-07-03 DIAGNOSIS — M1000 Idiopathic gout, unspecified site: Secondary | ICD-10-CM | POA: Diagnosis not present

## 2020-07-03 DIAGNOSIS — I10 Essential (primary) hypertension: Secondary | ICD-10-CM | POA: Diagnosis not present

## 2020-07-03 DIAGNOSIS — M10071 Idiopathic gout, right ankle and foot: Secondary | ICD-10-CM | POA: Diagnosis not present

## 2020-07-03 DIAGNOSIS — E782 Mixed hyperlipidemia: Secondary | ICD-10-CM | POA: Diagnosis not present

## 2020-07-03 DIAGNOSIS — M109 Gout, unspecified: Secondary | ICD-10-CM | POA: Insufficient documentation

## 2020-07-03 DIAGNOSIS — R1900 Intra-abdominal and pelvic swelling, mass and lump, unspecified site: Secondary | ICD-10-CM

## 2020-07-03 MED ORDER — PREDNISONE 5 MG PO TABS: 5.0000 mg | ORAL_TABLET | Freq: Every day | ORAL | 0 refills | Status: AC

## 2020-07-03 MED ORDER — ALLOPURINOL 100 MG PO TABS: ORAL_TABLET | ORAL | 6 refills | Status: AC

## 2020-07-03 NOTE — Progress Notes (Signed)
Reviewed chart and reviewed recent hx Se [facility] note  He had acute pain and swelling of right ankle.  Relieved quickly with 6 day pred pack.  He has other concerns: lipids and recent bx of abdominal mass.   Past Medical History:  Diagnosis Date  . AICD (automatic cardioverter/defibrillator) present   . Dyslipidemia   . Heart failure with mildly reduced EF    EF 35-40 in 2011 // Echo 06/2019: EF 45-50, Gr 2 DD, RAE, mild MR, mild TR, mild PI, RVSP 33.6  . History of kidney stones   . HTN (hypertension)   . Hx of ventricular fibrillation    Hx of VFib arrest s/p AICD placement  . Nonischemic cardiomyopathy     Social History   Socioeconomic History  . Marital status: Married    Spouse name: Not on file  . Number of children: Not on file  . Years of education: Not on file  . Highest education level: Not on file  Occupational History    Employer: [employer]  Tobacco Use  . Smoking status: Former Smoker    Packs/day: 0.25    Years: 2.00    Pack years: 0.50    Types: Cigarettes    Quit date: 07/12/1998    Years since quitting: 21.9  . Smokeless tobacco: Never Used  Vaping Use  . Vaping Use: Never used  Substance and Sexual Activity  . Alcohol use: No    Comment: Denies  . Drug use: No  . Sexual activity: Not on file  Other Topics Concern  . Not on file  Social History Narrative   The patient is married.  He is in native of [place],     living in [place] for 9 years.  He has a history of tobacco use,     but quit smoking a year ago.  Denies alcohol abuse.         Social Determinants of Health   Financial Resource Strain: Not on file  Food Insecurity: Not on file  Transportation Needs: Not on file  Physical Activity: Not on file  Stress: Not on file  Social Connections: Not on file  Intimate Partner Violence: Not on file    Past Surgical History:  Procedure Laterality Date  . CARDIAC DEFIBRILLATOR PLACEMENT    . ERCP N/A 05/27/2020   Procedure: ENDOSCOPIC  RETROGRADE CHOLANGIOPANCREATOGRAPHY (ERCP);  Surgeon: Clarene Essex, MD;  Location: [place];  Service: Endoscopy;  Laterality: N/A;  . ESOPHAGOGASTRODUODENOSCOPY (EGD) WITH PROPOFOL N/A 06/25/2020   Procedure: ESOPHAGOGASTRODUODENOSCOPY (EGD) WITH PROPOFOL;  Surgeon: Arta Silence, MD;  Location: [facility] ENDOSCOPY;  Service: Endoscopy;  Laterality: N/A;  . FINE NEEDLE ASPIRATION N/A 06/25/2020   Procedure: FINE NEEDLE ASPIRATION (FNA) LINEAR;  Surgeon: Arta Silence, MD;  Location: [facility] ENDOSCOPY;  Service: Endoscopy;  Laterality: N/A;  . PACEMAKER IMPLANT    . REMOVAL OF STONES  05/27/2020   Procedure: REMOVAL OF STONES;  Surgeon: Clarene Essex, MD;  Location: [facility] ENDOSCOPY;  Service: Endoscopy;;  . repeat echo  2008    demonstrated near normalization  . SPHINCTEROTOMY  05/27/2020   Procedure: SPHINCTEROTOMY;  Surgeon: Clarene Essex, MD;  Location: [facility] ENDOSCOPY;  Service: Endoscopy;;  . TRANSTHORACIC ECHOCARDIOGRAM  11/09/2005, 01/15/2009, 02/07/2010  . UPPER ESOPHAGEAL ENDOSCOPIC ULTRASOUND (EUS) N/A 06/25/2020   Procedure: UPPER ESOPHAGEAL ENDOSCOPIC ULTRASOUND (EUS);  Surgeon: Arta Silence, MD;  Location: [facility];  Service: Endoscopy;  Laterality: N/A;    Family History  Problem Relation Age of Onset  . Diabetes  Father   . Heart Problems Father        unsure of what kind  . Asthma Maternal Uncle   . Diabetes Maternal Grandmother     Allergies  Allergen Reactions  . Ace Inhibitors Cough    Current Outpatient Medications on File Prior to Visit  Medication Sig Dispense Refill  . amiodarone (PACERONE) 200 MG tablet Take 1 tablet (200 mg total) by mouth daily. 90 tablet 1  . aspirin 81 MG tablet Take 1 tablet (81 mg total) by mouth 2 (two) times daily. 30 tablet 11  . bisoprolol (ZEBETA) 10 MG tablet Take 1 tablet (10 mg total) by mouth daily. 90 tablet 3  . sacubitril-valsartan (ENTRESTO) 24-26 MG Take 1 tablet by mouth 2 (two) times daily. 180 tablet 3  . budesonide-formoterol  (SYMBICORT) 80-4.5 MCG/ACT inhaler Inhale 2 puffs into the lungs 2 (two) times daily. Must keep upcoming office visit for refills (Patient taking differently: Inhale 2 puffs into the lungs 2 (two) times daily as needed (for flares).) 1 Inhaler 0  . [DISCONTINUED] rosuvastatin (CRESTOR) 10 MG tablet Take 1 tablet (10 mg total) by mouth daily. To lower cholesterol 90 tablet 3   No current facility-administered medications on file prior to visit.     patient denies chest pain, shortness of breath, orthopnea. Denies lower extremity edema, abdominal pain, change in appetite, change in bowel movements. Patient denies rashes, musculoskeletal complaints. No other specific complaints in a complete review of systems.   BP 112/74   Pulse (!) 48   Temp 98.2 F (36.8 C) (Oral)   Resp 16   Wt 226 lb (102.5 kg)   SpO2 97%   BMI 29.02 kg/m   well-developed well-nourished male in no acute distress. HEENT exam atraumatic, normocephalic, neck supple without jugular venous distention. Chest clear to auscultation cardiac exam S1-S2 are regular. Abdominal exam overweight with bowel sounds, soft and nontender. Extremities no edema. Neurologic exam is alert with a normal gait.  Gout By hx he has gout Modestly elevated UA - will recheck sxs resolve within one day of starting   Will start allopurinol-- titrate up. Will use prophylactic prednisone for 14 days  HTN (hypertension) BP: 112/74  controlled  Hyperlipidemia Has not been checked in >1year He should probably be on statin He voices understanding Check lipid panel  Abdominal mass He asked for me to review pathology I told him that he has a tumor- well differentiated He has f/u with surgery

## 2020-07-03 NOTE — Assessment & Plan Note (Signed)
BP: 112/74  controlled

## 2020-07-03 NOTE — Progress Notes (Signed)
Bilateral knee and foot pain and swelling.  Onset x 2 days  Was at [facility] 06/29/2020 for right foot pain

## 2020-07-03 NOTE — Assessment & Plan Note (Signed)
He asked for me to review pathology I told him that he has a tumor- well differentiated He has f/u with surgery

## 2020-07-03 NOTE — Assessment & Plan Note (Signed)
Has not been checked in >1year He should probably be on statin He voices understanding Check lipid panel

## 2020-07-03 NOTE — Assessment & Plan Note (Signed)
By hx he has gout Modestly elevated UA - will recheck sxs resolve within one day of starting   Will start allopurinol-- titrate up. Will use prophylactic prednisone for 14 days

## 2020-07-04 LAB — BASIC METABOLIC PANEL
BUN/Creatinine Ratio: 19 (ref 9–20)
BUN: 18 mg/dL (ref 6–24)
CO2: 25 mmol/L (ref 20–29)
Calcium: 9.1 mg/dL (ref 8.7–10.2)
Chloride: 103 mmol/L (ref 96–106)
Creatinine, Ser: 0.96 mg/dL (ref 0.76–1.27)
GFR calc Af Amer: 107 mL/min/{1.73_m2} (ref >59)
GFR calc non Af Amer: 92 mL/min/{1.73_m2} (ref >59)
Glucose: 105 mg/dL — ABNORMAL HIGH (ref 65–99)
Potassium: 3.7 mmol/L (ref 3.5–5.2)
Sodium: 142 mmol/L (ref 134–144)

## 2020-07-04 LAB — URIC ACID: Uric Acid: 6 mg/dL (ref 3.8–8.4)

## 2020-07-11 ENCOUNTER — Other Ambulatory Visit: Payer: Managed Care, Other (non HMO)

## 2020-07-17 ENCOUNTER — Ambulatory Visit: Payer: Managed Care, Other (non HMO) | Admitting: Gastroenterology

## 2020-07-21 ENCOUNTER — Ambulatory Visit: Payer: Managed Care, Other (non HMO) | Attending: Nurse Practitioner

## 2020-07-21 ENCOUNTER — Other Ambulatory Visit: Payer: Self-pay

## 2020-07-21 DIAGNOSIS — Z79899 Other long term (current) drug therapy: Secondary | ICD-10-CM

## 2020-07-21 DIAGNOSIS — E785 Hyperlipidemia, unspecified: Secondary | ICD-10-CM

## 2020-07-22 ENCOUNTER — Other Ambulatory Visit: Payer: Self-pay | Admitting: Nurse Practitioner

## 2020-07-22 DIAGNOSIS — E785 Hyperlipidemia, unspecified: Secondary | ICD-10-CM

## 2020-07-22 LAB — COMPREHENSIVE METABOLIC PANEL WITH GFR
ALT: 22 IU/L (ref 0–44)
AST: 15 IU/L (ref 0–40)
Albumin/Globulin Ratio: 1.6 (ref 1.2–2.2)
Albumin: 4.1 g/dL (ref 4.0–5.0)
Alkaline Phosphatase: 65 IU/L (ref 44–121)
BUN/Creatinine Ratio: 13 (ref 9–20)
BUN: 16 mg/dL (ref 6–24)
Bilirubin Total: 0.8 mg/dL (ref 0.0–1.2)
CO2: 27 mmol/L (ref 20–29)
Calcium: 9.5 mg/dL (ref 8.7–10.2)
Chloride: 104 mmol/L (ref 96–106)
Creatinine, Ser: 1.19 mg/dL (ref 0.76–1.27)
GFR calc Af Amer: 82 mL/min/1.73
GFR calc non Af Amer: 71 mL/min/1.73
Globulin, Total: 2.6 g/dL (ref 1.5–4.5)
Glucose: 83 mg/dL (ref 65–99)
Potassium: 4.2 mmol/L (ref 3.5–5.2)
Sodium: 142 mmol/L (ref 134–144)
Total Protein: 6.7 g/dL (ref 6.0–8.5)

## 2020-07-22 LAB — LIPID PANEL
Chol/HDL Ratio: 6 ratio — ABNORMAL HIGH (ref 0.0–5.0)
Cholesterol, Total: 192 mg/dL (ref 100–199)
HDL: 32 mg/dL — ABNORMAL LOW
LDL Chol Calc (NIH): 123 mg/dL — ABNORMAL HIGH (ref 0–99)
Triglycerides: 207 mg/dL — ABNORMAL HIGH (ref 0–149)
VLDL Cholesterol Cal: 37 mg/dL (ref 5–40)

## 2020-07-22 MED ORDER — ROSUVASTATIN CALCIUM 10 MG PO TABS: 10.0000 mg | ORAL_TABLET | Freq: Every day | ORAL | 3 refills | Status: DC

## 2020-07-24 ENCOUNTER — Ambulatory Visit (INDEPENDENT_AMBULATORY_CARE_PROVIDER_SITE_OTHER): Payer: Managed Care, Other (non HMO)

## 2020-07-24 DIAGNOSIS — I4901 Ventricular fibrillation: Secondary | ICD-10-CM | POA: Diagnosis not present

## 2020-07-28 LAB — CUP PACEART REMOTE DEVICE CHECK
Battery Remaining Longevity: 8 mo
Battery Remaining Percentage: 6 %
Battery Voltage: 2.63 V
Brady Statistic RV Percent Paced: 8.2 %
Date Time Interrogation Session: 20220115223152
HighPow Impedance: 57 Ohm
Implantable Lead Implant Date: 20110805
Implantable Lead Location: 753860
Implantable Lead Model: 185
Implantable Lead Serial Number: 39993
Implantable Pulse Generator Implant Date: 20110805
Lead Channel Impedance Value: 400 Ohm
Lead Channel Pacing Threshold Amplitude: 0.75 V
Lead Channel Pacing Threshold Pulse Width: 0.4 ms
Lead Channel Sensing Intrinsic Amplitude: 11.7 mV
Lead Channel Setting Pacing Amplitude: 2.5 V
Lead Channel Setting Pacing Pulse Width: 0.4 ms
Lead Channel Setting Sensing Sensitivity: 0.5 mV
Pulse Gen Serial Number: 728671

## 2020-07-29 ENCOUNTER — Telehealth: Payer: Self-pay | Admitting: Internal Medicine

## 2020-07-29 DIAGNOSIS — I4901 Ventricular fibrillation: Secondary | ICD-10-CM

## 2020-07-29 DIAGNOSIS — I5022 Chronic systolic (congestive) heart failure: Secondary | ICD-10-CM

## 2020-07-29 MED FILL — AMIODARONE HCL 200 MG TAB: 200 | 30 days supply | Qty: 30 | Fill #5

## 2020-07-29 NOTE — Telephone Encounter (Signed)
°*  STAT* If patient is at the pharmacy, call can be transferred to refill team.   1. Which medications need to be refilled? (please list name of each medication and dose if known) amiodarone (PACERONE) 200 MG tablet  2. Which pharmacy/location (including street and city if local pharmacy) is medication to be sent to? [place], [place]  3. Do they need a 30 day or 90 day supply? 90 day

## 2020-07-29 NOTE — Telephone Encounter (Signed)
°  1. Has your device fired? no  2. Is you device beeping? no  3. Are you experiencing draining or swelling at device site? no  4. Are you calling to see if we received your device transmission? yes  5. Have you passed out? no  Patient states he sent transmission yesterday and wants to know if it was received.  Please route to [place]

## 2020-07-29 NOTE — Telephone Encounter (Signed)
Attempted to return pt phone call, no answer.  Left VM on identified VM advising transmission was received, noraml device function.  Noted battery within 1 year of ERI, next scheduled check is 08/29/20

## 2020-07-30 ENCOUNTER — Other Ambulatory Visit: Payer: Self-pay | Admitting: Internal Medicine

## 2020-07-30 MED ORDER — AMIODARONE HCL 200 MG PO TABS: 200.0000 mg | ORAL_TABLET | Freq: Every day | ORAL | 2 refills | Status: AC

## 2020-07-30 NOTE — Telephone Encounter (Signed)
Pt's medication was sent to pt's pharmacy as requested. Confirmation received.  °

## 2020-07-31 ENCOUNTER — Telehealth: Payer: Self-pay

## 2020-07-31 NOTE — Telephone Encounter (Signed)
Unable to reach pt to let him know FMLA form is ready for pt to pick up.

## 2020-08-06 NOTE — Progress Notes (Signed)
Remote ICD transmission.   

## 2020-08-21 ENCOUNTER — Observation Stay (HOSPITAL_COMMUNITY)
Admission: EM | Admit: 2020-08-21 | Discharge: 2020-08-22 | Disposition: A | Discharge status: Home / Self Care | Payer: Managed Care, Other (non HMO) | Attending: Emergency Medicine | Admitting: Emergency Medicine

## 2020-08-21 ENCOUNTER — Other Ambulatory Visit: Payer: Self-pay

## 2020-08-21 ENCOUNTER — Emergency Department (HOSPITAL_COMMUNITY): Payer: Managed Care, Other (non HMO)

## 2020-08-21 ENCOUNTER — Encounter (HOSPITAL_COMMUNITY): Payer: Self-pay | Admitting: Emergency Medicine

## 2020-08-21 DIAGNOSIS — Z7982 Long term (current) use of aspirin: Secondary | ICD-10-CM | POA: Diagnosis not present

## 2020-08-21 DIAGNOSIS — Z87442 Personal history of urinary calculi: Secondary | ICD-10-CM

## 2020-08-21 DIAGNOSIS — M109 Gout, unspecified: Secondary | ICD-10-CM | POA: Diagnosis present

## 2020-08-21 DIAGNOSIS — R109 Unspecified abdominal pain: Secondary | ICD-10-CM

## 2020-08-21 DIAGNOSIS — Z95810 Presence of automatic (implantable) cardiac defibrillator: Secondary | ICD-10-CM | POA: Diagnosis not present

## 2020-08-21 DIAGNOSIS — I472 Ventricular tachycardia: Secondary | ICD-10-CM

## 2020-08-21 DIAGNOSIS — J45909 Unspecified asthma, uncomplicated: Secondary | ICD-10-CM | POA: Diagnosis present

## 2020-08-21 DIAGNOSIS — J4520 Mild intermittent asthma, uncomplicated: Secondary | ICD-10-CM | POA: Diagnosis not present

## 2020-08-21 DIAGNOSIS — Z833 Family history of diabetes mellitus: Secondary | ICD-10-CM

## 2020-08-21 DIAGNOSIS — Z79899 Other long term (current) drug therapy: Secondary | ICD-10-CM | POA: Insufficient documentation

## 2020-08-21 DIAGNOSIS — I428 Other cardiomyopathies: Secondary | ICD-10-CM | POA: Diagnosis present

## 2020-08-21 DIAGNOSIS — Z8679 Personal history of other diseases of the circulatory system: Secondary | ICD-10-CM

## 2020-08-21 DIAGNOSIS — R197 Diarrhea, unspecified: Secondary | ICD-10-CM | POA: Diagnosis not present

## 2020-08-21 DIAGNOSIS — I509 Heart failure, unspecified: Secondary | ICD-10-CM | POA: Diagnosis not present

## 2020-08-21 DIAGNOSIS — D3A8 Other benign neuroendocrine tumors: Secondary | ICD-10-CM | POA: Diagnosis present

## 2020-08-21 DIAGNOSIS — Z01810 Encounter for preprocedural cardiovascular examination: Secondary | ICD-10-CM | POA: Diagnosis not present

## 2020-08-21 DIAGNOSIS — I110 Hypertensive heart disease with heart failure: Secondary | ICD-10-CM | POA: Insufficient documentation

## 2020-08-21 DIAGNOSIS — R112 Nausea with vomiting, unspecified: Secondary | ICD-10-CM | POA: Insufficient documentation

## 2020-08-21 DIAGNOSIS — Z825 Family history of asthma and other chronic lower respiratory diseases: Secondary | ICD-10-CM

## 2020-08-21 DIAGNOSIS — Z20822 Contact with and (suspected) exposure to covid-19: Secondary | ICD-10-CM | POA: Insufficient documentation

## 2020-08-21 DIAGNOSIS — K810 Acute cholecystitis: Secondary | ICD-10-CM

## 2020-08-21 DIAGNOSIS — I5032 Chronic diastolic (congestive) heart failure: Secondary | ICD-10-CM | POA: Insufficient documentation

## 2020-08-21 DIAGNOSIS — E785 Hyperlipidemia, unspecified: Secondary | ICD-10-CM | POA: Diagnosis present

## 2020-08-21 DIAGNOSIS — Z87891 Personal history of nicotine dependence: Secondary | ICD-10-CM | POA: Diagnosis not present

## 2020-08-21 DIAGNOSIS — I5020 Unspecified systolic (congestive) heart failure: Secondary | ICD-10-CM | POA: Diagnosis present

## 2020-08-21 DIAGNOSIS — K8020 Calculus of gallbladder without cholecystitis without obstruction: Secondary | ICD-10-CM | POA: Diagnosis not present

## 2020-08-21 DIAGNOSIS — Z888 Allergy status to other drugs, medicaments and biological substances status: Secondary | ICD-10-CM

## 2020-08-21 DIAGNOSIS — K861 Other chronic pancreatitis: Secondary | ICD-10-CM | POA: Diagnosis present

## 2020-08-21 DIAGNOSIS — Z8674 Personal history of sudden cardiac arrest: Secondary | ICD-10-CM

## 2020-08-21 LAB — URINALYSIS, ROUTINE W REFLEX MICROSCOPIC
Bilirubin Urine: NEGATIVE
Glucose, UA: NEGATIVE mg/dL
Ketones, ur: NEGATIVE mg/dL
Leukocytes,Ua: NEGATIVE
Nitrite: NEGATIVE
Protein, ur: 100 mg/dL — AB
Specific Gravity, Urine: >1.03 — ABNORMAL HIGH (ref 1.005–1.030)
pH: 5 (ref 5.0–8.0)

## 2020-08-21 LAB — COMPREHENSIVE METABOLIC PANEL
ALT: 31 U/L (ref 0–44)
AST: 29 U/L (ref 15–41)
Albumin: 3.7 g/dL (ref 3.5–5.0)
Alkaline Phosphatase: 64 U/L (ref 38–126)
Anion gap: 10 (ref 5–15)
BUN: 11 mg/dL (ref 6–20)
CO2: 22 mmol/L (ref 22–32)
Calcium: 8.7 mg/dL — ABNORMAL LOW (ref 8.9–10.3)
Chloride: 106 mmol/L (ref 98–111)
Creatinine, Ser: 1.07 mg/dL (ref 0.61–1.24)
GFR, Estimated: >60 mL/min (ref >60)
Glucose, Bld: 98 mg/dL (ref 70–99)
Potassium: 4.1 mmol/L (ref 3.5–5.1)
Sodium: 138 mmol/L (ref 135–145)
Total Bilirubin: 0.9 mg/dL (ref 0.3–1.2)
Total Protein: 6.7 g/dL (ref 6.5–8.1)

## 2020-08-21 LAB — CBC
HCT: 47.4 % (ref 39.0–52.0)
Hemoglobin: 15.7 g/dL (ref 13.0–17.0)
MCH: 28.2 pg (ref 26.0–34.0)
MCHC: 33.1 g/dL (ref 30.0–36.0)
MCV: 85.3 fL (ref 80.0–100.0)
Platelets: 327 10*3/uL (ref 150–400)
RBC: 5.56 MIL/uL (ref 4.22–5.81)
RDW: 14.7 % (ref 11.5–15.5)
WBC: 7.2 10*3/uL (ref 4.0–10.5)
nRBC: 0 % (ref 0.0–0.2)

## 2020-08-21 LAB — URINALYSIS, MICROSCOPIC (REFLEX)

## 2020-08-21 LAB — LIPASE, BLOOD: Lipase: 195 U/L — ABNORMAL HIGH (ref 11–51)

## 2020-08-21 MED ORDER — FAMOTIDINE IN NACL 20-0.9 MG/50ML-% IV SOLN
20.0000 mg | Freq: Once | INTRAVENOUS | Status: AC
  Administered 2020-08-21: 20 mg via INTRAVENOUS
  Filled 2020-08-21: qty 50

## 2020-08-21 MED ORDER — SODIUM CHLORIDE 0.9 % IV BOLUS
1000.0000 mL | Freq: Once | INTRAVENOUS | Status: AC
  Administered 2020-08-21: 1000 mL via INTRAVENOUS

## 2020-08-21 MED ORDER — HYDROMORPHONE HCL 1 MG/ML IJ SOLN
0.5000 mg | INTRAMUSCULAR | Status: DC | PRN
  Administered 2020-08-21: 0.5 mg via INTRAVENOUS
  Filled 2020-08-21: qty 1

## 2020-08-21 MED ORDER — LACTATED RINGERS IV SOLN: INTRAVENOUS | Status: DC

## 2020-08-21 MED ORDER — HEPARIN SODIUM (PORCINE) 5000 UNIT/ML IJ SOLN: 5000.0000 [IU] | Freq: Three times a day (TID) | INTRAMUSCULAR | Status: DC

## 2020-08-21 MED ORDER — ONDANSETRON 4 MG PO TBDP: 4.0000 mg | ORAL_TABLET | Freq: Four times a day (QID) | ORAL | Status: DC | PRN

## 2020-08-21 MED ORDER — BISOPROLOL FUMARATE 10 MG PO TABS
10.0000 mg | ORAL_TABLET | Freq: Every day | ORAL | Status: DC
  Filled 2020-08-21: qty 1

## 2020-08-21 MED ORDER — PIPERACILLIN-TAZOBACTAM 3.375 G IVPB 30 MIN
3.3750 g | Freq: Once | INTRAVENOUS | Status: AC
  Administered 2020-08-21: 3.375 g via INTRAVENOUS
  Filled 2020-08-21: qty 50

## 2020-08-21 MED ORDER — SODIUM CHLORIDE 0.9 % IV SOLN
2.0000 g | INTRAVENOUS | Status: DC
  Administered 2020-08-22: 2 g via INTRAVENOUS
  Filled 2020-08-21: qty 20

## 2020-08-21 MED ORDER — HYDROMORPHONE HCL 1 MG/ML IJ SOLN
1.0000 mg | Freq: Once | INTRAMUSCULAR | Status: AC
  Administered 2020-08-21: 1 mg via INTRAVENOUS
  Filled 2020-08-21: qty 1

## 2020-08-21 MED ORDER — ACETAMINOPHEN 500 MG PO TABS
1000.0000 mg | ORAL_TABLET | Freq: Four times a day (QID) | ORAL | Status: DC
  Administered 2020-08-22: 1000 mg via ORAL
  Filled 2020-08-21: qty 2

## 2020-08-21 MED ORDER — HYDROMORPHONE HCL 1 MG/ML IJ SOLN
1.0000 mg | Freq: Once | INTRAMUSCULAR | Status: AC
Start: 2020-08-21 — End: 2020-08-21
  Administered 2020-08-21: 1 mg via INTRAVENOUS
  Filled 2020-08-21: qty 1

## 2020-08-21 MED ORDER — ONDANSETRON HCL 4 MG/2ML IJ SOLN: 4.0000 mg | Freq: Four times a day (QID) | INTRAMUSCULAR | Status: DC | PRN

## 2020-08-21 MED ORDER — OXYCODONE HCL 5 MG PO TABS
5.0000 mg | ORAL_TABLET | ORAL | Status: DC | PRN
  Administered 2020-08-22: 5 mg via ORAL
  Filled 2020-08-21: qty 1

## 2020-08-21 MED ORDER — IOHEXOL 300 MG/ML  SOLN
100.0000 mL | Freq: Once | INTRAMUSCULAR | Status: AC | PRN
  Administered 2020-08-21: 100 mL via INTRAVENOUS

## 2020-08-21 MED ORDER — ONDANSETRON HCL 4 MG/2ML IJ SOLN
4.0000 mg | Freq: Once | INTRAMUSCULAR | Status: AC
  Administered 2020-08-21: 4 mg via INTRAVENOUS
  Filled 2020-08-21: qty 2

## 2020-08-21 NOTE — ED Triage Notes (Addendum)
Patient here with complaint of abdominal pain and diarrhea that started three days ago. Denies other complaints. History of gallstones, states this pain feels worse than when he found out he had gallstones.

## 2020-08-21 NOTE — ED Triage Notes (Signed)
Emergency Medicine Provider Triage Evaluation Note  [name] , a 50 y.o. male  was evaluated in triage.  Pt complains of abdominal pain for three days.  He says that this feels worse than when he had been diagnosed with gallstones.  He denies any fevers.  Has been vomiting.  No diarrhea.  He denies any chest pain.    Review of Systems  Positive: Abdominal pain Nausea, vomiting.  Negative: fevers  Physical Exam  BP (!) 142/83   Pulse (!) 41   Temp 98.8 F (37.1 C)   Resp (!) 23   SpO2 99%  Gen:   Awake, appears uncomfortable HEENT:  Atraumatic  Resp:  Normal effort  Cardiac:  Normal rate  Abd:   Diffusely tender, most in RUQ.  Feels bloated MSK:   Moves extremities without difficulty  Neuro:  Speech clear   Medical Decision Making  Medically screening exam initiated at 1:32 PM.  Appropriate orders placed.  Gustabo Gordillo was informed that the remainder of the evaluation will be completed by another provider, this initial triage assessment does not replace that evaluation, and the importance of remaining in the ED until their evaluation is complete.  RN placed 20g IV in [place].  Unable to give injection pain meds from triage and patient needs to be NPO.  CT scan ordered.   Clinical Impression  Labs show elevated lipase. Concern for gallstone pancreatitis.    Lorin Glass, [place] 08/21/20 1339

## 2020-08-21 NOTE — Consult Note (Signed)
Cardiology Consultation:   Patient ID: [name] MRN: [id number]; DOB: [birth year]  Admit date: 08/21/2020 Date of Consult: 08/21/2020  PCP:  Gildardo Pounds, NP   [place]  Cardiologist:  Cristopher Peru, MD  Electrophysiologist:  Lovena Le        Patient Profile:   [name] is a 50 y.o. male with a hx of heart failure with mildly reduced ejection fraction (EF 35-40 in 2011 >> improved to 45-50 in 2020, stable 45-50% in Nov 2021), non-ischemic cardiomyopathy, s/p AICD, VT/VF on Amiodarone Rx, hypertension, hyperlipidemia who is being seen today for the evaluation of preoperative cardiac risk evaluation at the request of Dr. Liborio Nixon.   History of Present Illness:   [name] has actually been seen recently by cardiology (November 2021 by scott weaver and Dr. 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 Pt was felt to be at moderate cardiac risk for any planned procedure regarding this tumor.  This evening he presented to the ER with abdominal pain, N/V. It is felt that sx were from symptomatic cholelithiasis, and cholecystectomy is planned.  Cardiology is now asked to consult for preop CV risk evaluation prior to possible surgical procedures.      He has been doing well from a cardiac standpoint.  He denies any anginal sx or DOE, palpitations, dizziness, LE edema, PND, orthopnea or syncope.  He has not had any firing of his AICD.  He only gets DOE with extreme exertion.  He is able to achieve at least 4.5 mets of activity with no cardiac sx.     There has been no significant change in cardiac sx or functional status since pt was last seen and evaluated in Nov/Dec 2021.   Past Medical History:  Diagnosis Date  . AICD (automatic  cardioverter/defibrillator) present   . Dyslipidemia   . Heart failure with mildly reduced EF    EF 35-40 in 2011 // Echo 06/2019: EF 45-50, Gr 2 DD, RAE, mild MR, mild TR, mild PI, RVSP 33.6  . History of kidney stones   . HTN (hypertension)   . Hx of ventricular fibrillation    Hx of VFib arrest s/p AICD placement  . Nonischemic cardiomyopathy     Past Surgical History:  Procedure Laterality Date  . CARDIAC DEFIBRILLATOR PLACEMENT    . ERCP N/A 05/27/2020   Procedure: ENDOSCOPIC RETROGRADE CHOLANGIOPANCREATOGRAPHY (ERCP);  Surgeon: Clarene Essex, MD;  Location: [facility];  Service: Endoscopy;  Laterality: N/A;  . ESOPHAGOGASTRODUODENOSCOPY (EGD) WITH PROPOFOL N/A 06/25/2020   Procedure: ESOPHAGOGASTRODUODENOSCOPY (EGD) WITH PROPOFOL;  Surgeon: Arta Silence, MD;  [facility];  Service: Endoscopy;  Laterality: N/A;  . FINE NEEDLE ASPIRATION N/A 06/25/2020   Procedure: FINE NEEDLE ASPIRATION (FNA) LINEAR;  Surgeon: Arta Silence, MD;  Location[facility];  Service: Endoscopy;  Laterality: N/A;  . PACEMAKER IMPLANT    . REMOVAL OF STONES  05/27/2020   Procedure: REMOVAL OF STONES;  Surgeon: Clarene Essex, MD;  Location: [facility] ENDOSCOPY;  Service: Endoscopy;;  . repeat echo  2008    demonstrated near normalization  . SPHINCTEROTOMY  05/27/2020   Procedure: SPHINCTEROTOMY;  Surgeon: Clarene Essex, MD;  [facility] END[facility]COPY;  Service: Endoscopy;;  . TRANSTHORACIC ECHOCARDIOGRAM  11/09/2005, 01/15/2009, 02/07/2010  . UPPER ESOPHAGEAL ENDOSCOPIC ULTRASOUND (EUS) N/A 06/25/2020   Procedure: UPPER ESOPHAGEAL  ENDOSCOPIC ULTRASOUND (EUS);  Surgeon: Arta Silence, MD;  Location: [facility];  Service: Endoscopy;  Laterality: N/A;     Home Medications:  Prior to Admission medications   Medication Sig Start Date End Date Taking? Authorizing Provider  allopurinol (ZYLOPRIM) 100 MG tablet Take one tablet by mouth daily for 1 week and then 2 tablets by mouth daily 07/03/20    Swords, Darrick Penna, MD  amiodarone (PACERONE) 200 MG tablet Take 1 tablet (200 mg total) by mouth daily. 07/30/20   Evans Lance, MD  aspirin 81 MG tablet Take 1 tablet (81 mg total) by mouth 2 (two) times daily. 09/22/15   Funches, Adriana Mccallum, MD  bisoprolol (ZEBETA) 10 MG tablet Take 1 tablet (10 mg total) by mouth daily. 06/25/20   Evans Lance, MD  budesonide-formoterol [facility]) 80-4.5 MCG/ACT inhaler Inhale 2 puffs into the lungs 2 (two) times daily. Must keep upcoming office visit for refills Patient taking differently: Inhale 2 puffs into the lungs 2 (two) times daily as needed (for flares). 11/09/19   Fulp, Cammie, MD  rosuvastatin (CRESTOR) 10 MG tablet Take 1 tablet (10 mg total) by mouth daily. To lower cholesterol 07/22/20   Gildardo Pounds, NP  sacubitril-valsartan (ENTRESTO) 24-26 MG Take 1 tablet by mouth 2 (two) times daily. 06/25/20   Evans Lance, MD    Inpatient Medications: Scheduled Meds: . [START ON 08/22/2020] acetaminophen  1,000 mg Oral Q6H  . [START ON 08/22/2020] bisoprolol  10 mg Oral Daily  . heparin  5,000 Units Subcutaneous Q8H   Continuous Infusions: . [START ON 08/22/2020] cefTRIAXone (ROCEPHIN)  IV    . lactated ringers     PRN Meds: HYDROmorphone (DILAUDID) injection, ondansetron **OR** ondansetron (ZOFRAN) IV, oxyCODONE  Allergies:    Allergies  Allergen Reactions  . Ace Inhibitors Cough    Social History:   Social History   Socioeconomic History  . Marital status: Married    Spouse name: Not on file  . Number of children: Not on file  . Years of education: Not on file  . Highest education level: Not on file  Occupational History    Employer: [employer]  Tobacco Use  . Smoking status: Former Smoker    Packs/day: 0.25    Years: 2.00    Pack years: 0.50    Types: Cigarettes    Quit date: 07/12/1998    Years since quitting: 22.1  . Smokeless tobacco: Never Used  Vaping Use  . Vaping Use: Never used  Substance and Sexual Activity  . Alcohol  use: No    Comment: Denies  . Drug use: No  . Sexual activity: Not on file  Other Topics Concern  . Not on file  Social History Narrative   The patient is married.  He is in native of [place],     living in [place] for 9 years.  He has a history of tobacco use,     but quit smoking a year ago.  Denies alcohol abuse.         Social Determinants of Health   Financial Resource Strain: Not on file  Food Insecurity: Not on file  Transportation Needs: Not on file  Physical Activity: Not on file  Stress: Not on file  Social Connections: Not on file  Intimate Partner Violence: Not on file    Family History:    Family History  Problem Relation Age of Onset  . Diabetes Father   . Heart Problems Father  unsure of what kind  . Asthma Maternal Uncle   . Diabetes Maternal Grandmother      ROS:  Please see the history of present illness.   All other ROS reviewed and negative.     Physical Exam/Data:   Vitals:   08/21/20 2000 08/21/20 2030 08/21/20 2100 08/21/20 2130  BP: 104/62 (!) 100/53 114/74 108/69  Pulse: (!) 44 (!) 48 (!) 44 (!) 43  Resp: 20 (!) 23 (!) 21 (!) 22  Temp:      SpO2: 96% 98% 97% 99%    Intake/Output Summary (Last 24 hours) at 08/21/2020 2251 Last data filed at 08/21/2020 2204 Gross per 24 hour  Intake 1050 ml  Output --  Net 1050 ml   Last 3 Weights 07/03/2020 06/25/2020 06/18/2020  Weight (lbs) 226 lb 236 lb 235 lb  Weight (kg) 102.513 kg 107.049 kg 106.595 kg     There is no height or weight on file to calculate BMI.  General:  Well nourished, well developed, in no acute distress HEENT: normal Lymph: no adenopathy Neck: no JVD Endocrine:  No thryomegaly Vascular: No carotid bruits; DP pulses 2+ bilaterally   Cardiac:  normal S1, S2; RRR; no murmur  Lungs:  clear to auscultation bilaterally, no wheezing, rhonchi or rales  Abd: soft, nontender, no hepatomegaly  Ext: no edema Musculoskeletal:  No deformities Skin: warm and dry  Neuro:   no focal abnormalities noted Psych:  Normal affect   EKG:  The EKG was personally reviewed and demonstrates:  SB with occasional V-paced beats Telemetry:  Telemetry was personally reviewed and demonstrates:  SB  Relevant CV Studies:     TTE 05-25-20 1. Left ventricular ejection fraction, by estimation, is 45 to 50%. The  left ventricle has mildly decreased function. The left ventricle  demonstrates global hypokinesis. The left ventricular internal cavity size  was mildly dilated. There is mild  concentric left ventricular hypertrophy. Left ventricular diastolic  parameters are consistent with Grade III diastolic dysfunction  (restrictive). Elevated left ventricular end-diastolic pressure.  2. Right ventricular systolic function is normal. The right ventricular  size is normal. There is normal pulmonary artery systolic pressure. The  estimated right ventricular systolic pressure is 50.5 mmHg.  3. Left atrial size was moderately dilated.  4. The mitral valve is normal in structure. Trivial mitral valve  regurgitation. No evidence of mitral stenosis.  5. The aortic valve is normal in structure. Aortic valve regurgitation is  not visualized. Mild aortic valve sclerosis is present, with no evidence  of aortic valve stenosis.  6. The inferior vena cava is normal in size with greater than 50%  respiratory variability, suggesting right atrial pressure of 3 mmHg.   Laboratory Data:  High Sensitivity Troponin:  No results for input(s): TROPONINIHS in the last 720 hours.   Chemistry Recent Labs  Lab 08/21/20 1037  NA 138  K 4.1  CL 106  CO2 22  GLUCOSE 98  BUN 11  CREATININE 1.07  CALCIUM 8.7*  GFRNONAA >60  ANIONGAP 10    Recent Labs  Lab 08/21/20 1037  PROT 6.7  ALBUMIN 3.7  AST 29  ALT 31  ALKPHOS 64  BILITOT 0.9   Hematology Recent Labs  Lab 08/21/20 1037  WBC 7.2  RBC 5.56  HGB 15.7  HCT 47.4  MCV 85.3  MCH 28.2  MCHC 33.1  RDW 14.7  PLT 327    BNPNo results for input(s): BNP, PROBNP in the last 168 hours.  DDimer  No results for input(s): DDIMER in the last 168 hours.   Radiology/Studies:  CT Abdomen Pelvis W Contrast  Result Date: 08/21/2020 CLINICAL DATA:  Severe abdominal pain EXAM: CT ABDOMEN AND PELVIS WITH CONTRAST TECHNIQUE: Multidetector CT imaging of the abdomen and pelvis was performed using the standard protocol following bolus administration of intravenous contrast. CONTRAST:  142mL OMNIPAQUE IOHEXOL 300 MG/ML  SOLN COMPARISON:  05/24/2020 FINDINGS: Lower chest: Mild bibasilar atelectatic changes are noted. Hepatobiliary: Liver demonstrates mild fatty infiltration. Gallbladder is well distended with multiple gallstones within. Pancreas: Pancreas is well visualized and within normal limits with the exception on the centrally necrotic mass lesion adjacent to the head and uncinate process of the pancreas. This is stable in appearance from the prior exam. Spleen: Normal in size without focal abnormality. Adrenals/Urinary Tract: Adrenal glands are within normal limits. Kidneys demonstrate a normal enhancement pattern. No renal calculi or obstructive changes are noted. The bladder is partially distended. Stomach/Bowel: Appendix is within normal limits. No obstructive or inflammatory changes of the colon are seen. Small bowel and stomach appear within normal limits. Vascular/Lymphatic: No significant vascular findings are present. No enlarged abdominal or pelvic lymph nodes. Reproductive: Prostate is unremarkable. Other: No abdominal wall hernia or abnormality. No abdominopelvic ascites. Musculoskeletal: No acute or significant osseous findings. IMPRESSION: Stable mass lesion adjacent to the head and uncinate process of the pancreas. This is stable dating back to 2019 and has been recently biopsied. Cholelithiasis without complicating factors. Mild fatty infiltration of the liver. Electronically Signed   By: Inez Catalina M.D.   On:  08/21/2020 14:42   US Abdomen Limited RUQ (LIVER/GB)  Result Date: 08/21/2020 CLINICAL DATA:  Abdominal pain.  Rule out cholecystitis. EXAM: ULTRASOUND ABDOMEN LIMITED RIGHT UPPER QUADRANT COMPARISON:  None. FINDINGS: Gallbladder: Gallstones noted within the gallbladder. There is wall thickening measuring up to 12 mm and pericholecystic fluid. Ring down artifact noted from the anterior gallbladder wall compatible with adenomyomatosis. The patient was not tender over the gallbladder during the study. Common bile duct: Diameter: Normal caliber, 3 mm Liver: No focal lesion identified. Within normal limits in parenchymal echogenicity. Portal vein is patent on color Doppler imaging with normal direction of blood flow towards the liver. Other: None. IMPRESSION: Cholelithiasis. Changes of adenomyomatosis. Wall thickening and pericholecystic fluid without sonographic Murphy sign. Recommend clinical correlation to exclude acute cholecystitis. Electronically Signed   By: Rolm Baptise M.D.   On: 08/21/2020 19:23     Assessment and Plan:   1. Preop: He is completely stable from a cardiac standpoint.  No further ischemic workup indicated at this time.   Continue current cardiac meds.  His periop risk of major cardiac event is 6.6% by the RCRI and at moderate risk from a cardiac standpoint.   Therefore,based on ACC/AHA guidelines, patientwould be at acceptable risk for the planned procedure without further cardiovascular testing. We can get [employer] rep involved to assist surgery in management of pt's ICD perioperatively.   2. HFrEF: last EF 45-50%. Volume status stable.  3. VT/VF: has ICD; will get [employer] rep to assist perioperatively as above. Cont chronic amio for suppression.   Risk Assessment/Risk Scores:        New York Heart Association (NYHA) Functional Class NYHA Class II        For questions or updates, please contact [facility] Please consult [email or web address] for contact info under     Signed, Rudean Curt, MD, [facility]  08/21/2020 10:51 PM

## 2020-08-21 NOTE — H&P (Signed)
Reason for Consult: Abdominal pain and cholelithiasis Referring Physician: Dr. Ashok Cordia  HPI: [name] is an 50 y.o. male with history of Vfib s/p AICD placement, HTN, HLD, gout, asthma, mild chronic pancreatitis, symptomatic cholelithiasis and a well differentiated neuroendocrine tumor of the mesentery adjacent to the head and uncinate process of the pancreas who presents c/o abdominal pain for 1 day. He also reports nausea and vomiting for the same duration. He reports that he was on a trip to [place] 2 weeks ago. He also reports that his brother and himself both had diarrhea after that trip. He still reports having diarrhea. Denies fever, chills, SOB, constipation. Denies dysuric symptoms.   Presents in the ED today hemodynamically normal, afebrile. No leucocytosis. Labs notable for lipase of 195. LFTs within normal limits.   CT a/p with cholelithiasis and stable mesenteric mass adjacent to the head and uncinate process of the pancreas. US of the gallbladder with cholelithiasis, wall thickening (38mm) and PC fluid.  General Surgery was consulted for further recommendations   Past Medical History:  Diagnosis Date  . AICD (automatic cardioverter/defibrillator) present   . Dyslipidemia   . Heart failure with mildly reduced EF    EF 35-40 in 2011 // Echo 06/2019: EF 45-50, Gr 2 DD, RAE, mild MR, mild TR, mild PI, RVSP 33.6  . History of kidney stones   . HTN (hypertension)   . Hx of ventricular fibrillation    Hx of VFib arrest s/p AICD placement  . Nonischemic cardiomyopathy     Past Surgical History:  Procedure Laterality Date  . CARDIAC DEFIBRILLATOR PLACEMENT    . ERCP N/A 05/27/2020   Procedure: ENDOSCOPIC RETROGRADE CHOLANGIOPANCREATOGRAPHY (ERCP);  Surgeon: Clarene Essex, MD;  Location: [facility];  Service: Endoscopy;  Laterality: N/A;  . ESOPHAGOGASTRODUODENOSCOPY (EGD) WITH PROPOFOL N/A 06/25/2020   Procedure: ESOPHAGOGASTRODUODENOSCOPY (EGD) WITH PROPOFOL;  Surgeon:  Arta Silence, MD;  [facility];  Service: Endoscopy;  Laterality: N/A;  . FINE NEEDLE ASPIRATION N/A 06/25/2020   Procedure: FINE NEEDLE ASPIRATION (FNA) LINEAR;  Surgeon: Arta Silence, MD;  Location[facility];  Service: Endoscopy;  Laterality: N/A;  . PACEMAKER IMPLANT    . REMOVAL OF STONES  05/27/2020   Procedure: REMOVAL OF STONES;  Surgeon: Clarene Essex, MD;  Location: [facility] ENDOSCOPY;  Service: Endoscopy;;  . repeat echo  2008    demonstrated near normalization  . SPHINCTEROTOMY  05/27/2020   Procedure: SPHINCTEROTOMY;  Surgeon: Clarene Essex, MD;  Location: [facility] ENDOSCOPY;  Service: Endoscopy;;  . TRANSTHORACIC ECHOCARDIOGRAM  11/09/2005, 01/15/2009, 02/07/2010  . UPPER ESOPHAGEAL ENDOSCOPIC ULTRASOUND (EUS) N/A 06/25/2020   Procedure: UPPER ESOPHAGEAL ENDOSCOPIC ULTRASOUND (EUS);  Surgeon: Arta Silence, MD;  Location: [facility];  Service: Endoscopy;  Laterality: N/A;    Family History  Problem Relation Age of Onset  . Diabetes Father   . Heart Problems Father        unsure of what kind  . Asthma Maternal Uncle   . Diabetes Maternal Grandmother     Social History:  reports that he quit smoking about 22 years ago. His smoking use included cigarettes. He has a 0.50 pack-year smoking history. He has never used smokeless tobacco. He reports that he does not drink alcohol and does not use drugs.  Allergies:  Allergies  Allergen Reactions  . Ace Inhibitors Cough    Medications: I have reviewed the patient's current medications.  Results for orders placed or performed during the hospital encounter of 08/21/20 (from the past 48 hour(s))  Urinalysis, Routine w reflex microscopic Urine, Clean Catch     Status: Abnormal   Collection Time: 08/21/20 10:29 AM  Result Value Ref Range   Color, Urine YELLOW YELLOW   APPearance CLEAR CLEAR   Specific Gravity, Urine >1.030 (H) 1.005 - 1.030   pH 5.0 5.0 - 8.0   Glucose, UA NEGATIVE NEGATIVE mg/dL   Hgb urine dipstick  TRACE (A) NEGATIVE   Bilirubin Urine NEGATIVE NEGATIVE   Ketones, ur NEGATIVE NEGATIVE mg/dL   Protein, ur 100 (A) NEGATIVE mg/dL   Nitrite NEGATIVE NEGATIVE   Leukocytes,Ua NEGATIVE NEGATIVE    Comment: Performed at [facility] [address], [place], [place] [address]  Urinalysis, Microscopic (reflex)     Status: Abnormal   Collection Time: 08/21/20 10:29 AM  Result Value Ref Range   RBC / HPF 0-5 0 - 5 RBC/hpf   WBC, UA 0-5 0 - 5 WBC/hpf   Bacteria, UA RARE (A) NONE SEEN   Squamous Epithelial / LPF 0-5 0 - 5   Mucus PRESENT     Comment: Performed at [facility], [place] [address] [place], [place] [address]  Lipase, blood     Status: Abnormal   Collection Time: 08/21/20 10:37 AM  Result Value Ref Range   Lipase 195 (H) 11 - 51 U/L    Comment: Performed at [facility], [place] [address], [place], [place] [address]  Comprehensive metabolic panel     Status: Abnormal   Collection Time: 08/21/20 10:37 AM  Result Value Ref Range   Sodium 138 135 - 145 mmol/L   Potassium 4.1 3.5 - 5.1 mmol/L   Chloride 106 98 - 111 mmol/L   CO2 22 22 - 32 mmol/L   Glucose, Bld 98 70 - 99 mg/dL    Comment: Glucose reference range applies only to samples taken after fasting for at least 8 hours.   BUN 11 6 - 20 mg/dL   Creatinine, Ser 1.07 0.61 - 1.24 mg/dL   Calcium 8.7 (L) 8.9 - 10.3 mg/dL   Total Protein 6.7 6.5 - 8.1 g/dL   Albumin 3.7 3.5 - 5.0 g/dL   AST 29 15 - 41 U/L   ALT 31 0 - 44 U/L   Alkaline Phosphatase 64 38 - 126 U/L   Total Bilirubin 0.9 0.3 - 1.2 mg/dL   GFR, Estimated >60 >60 mL/min    Comment: (NOTE) Calculated using the CKD-EPI Creatinine Equation (2021)    Anion gap 10 5 - 15    Comment: Performed at [facility] [address], [place] [address]  CBC     Status: None   Collection Time: 08/21/20 10:37 AM  Result Value Ref Range   WBC 7.2 4.0 - 10.5 K/uL   RBC 5.56 4.22 - 5.81 MIL/uL   Hemoglobin 15.7 13.0 - 17.0 g/dL   HCT 47.4 39.0  - 52.0 %   MCV 85.3 80.0 - 100.0 fL   MCH 28.2 26.0 - 34.0 pg   MCHC 33.1 30.0 - 36.0 g/dL   RDW 14.7 11.5 - 15.5 %   Platelets 327 150 - 400 K/uL   nRBC 0.0 0.0 - 0.2 %    Comment: Performed at [facility], [place] [address] [place], [place] [address]    CT Abdomen Pelvis W Contrast  Result Date: 08/21/2020 CLINICAL DATA:  Severe abdominal pain EXAM: CT ABDOMEN AND PELVIS WITH CONTRAST TECHNIQUE: Multidetector CT imaging of the abdomen  and pelvis was performed using the standard protocol following bolus administration of intravenous contrast. CONTRAST:  174mL OMNIPAQUE IOHEXOL 300 MG/ML  SOLN COMPARISON:  05/24/2020 FINDINGS: Lower chest: Mild bibasilar atelectatic changes are noted. Hepatobiliary: Liver demonstrates mild fatty infiltration. Gallbladder is well distended with multiple gallstones within. Pancreas: Pancreas is well visualized and within normal limits with the exception on the centrally necrotic mass lesion adjacent to the head and uncinate process of the pancreas. This is stable in appearance from the prior exam. Spleen: Normal in size without focal abnormality. Adrenals/Urinary Tract: Adrenal glands are within normal limits. Kidneys demonstrate a normal enhancement pattern. No renal calculi or obstructive changes are noted. The bladder is partially distended. Stomach/Bowel: Appendix is within normal limits. No obstructive or inflammatory changes of the colon are seen. Small bowel and stomach appear within normal limits. Vascular/Lymphatic: No significant vascular findings are present. No enlarged abdominal or pelvic lymph nodes. Reproductive: Prostate is unremarkable. Other: No abdominal wall hernia or abnormality. No abdominopelvic ascites. Musculoskeletal: No acute or significant osseous findings. IMPRESSION: Stable mass lesion adjacent to the head and uncinate process of the pancreas. This is stable dating back to 2019 and has been recently biopsied. Cholelithiasis without  complicating factors. Mild fatty infiltration of the liver. Electronically Signed   By: Inez Catalina M.D.   On: 08/21/2020 14:42   US Abdomen Limited RUQ (LIVER/GB)  Result Date: 08/21/2020 CLINICAL DATA:  Abdominal pain.  Rule out cholecystitis. EXAM: ULTRASOUND ABDOMEN LIMITED RIGHT UPPER QUADRANT COMPARISON:  None. FINDINGS: Gallbladder: Gallstones noted within the gallbladder. There is wall thickening measuring up to 12 mm and pericholecystic fluid. Ring down artifact noted from the anterior gallbladder wall compatible with adenomyomatosis. The patient was not tender over the gallbladder during the study. Common bile duct: Diameter: Normal caliber, 3 mm Liver: No focal lesion identified. Within normal limits in parenchymal echogenicity. Portal vein is patent on color Doppler imaging with normal direction of blood flow towards the liver. Other: None. IMPRESSION: Cholelithiasis. Changes of adenomyomatosis. Wall thickening and pericholecystic fluid without sonographic Murphy sign. Recommend clinical correlation to exclude acute cholecystitis. Electronically Signed   By: Rolm Baptise M.D.   On: 08/21/2020 19:23    ROS  PE Blood pressure 124/72, pulse (!) 41, temperature 98.8 F (37.1 C), resp. rate 19, SpO2 97 %. Constitutional: NAD; conversant; no deformities Eyes: Moist conjunctiva; no lid lag; anicteric; PERRL Neck: Trachea midline; no thyromegaly Lungs: Normal respiratory effort; no tactile fremitus CV: RRR; no palpable thrills; no pitting edema GI: Abd soft, non-distended, tender to palpation in the RUQ and epigastric area; negative Murphy's sign; no palpable hepatosplenomegaly, no guarding. MSK: no clubbing/cyanosis Psychiatric: Appropriate affect; alert and oriented x3 Lymphatic: No palpable cervical or axillary lymphadenopathy Skin: No major subcutaneous nodules. Warm and dry   Assessment/Plan: [name] is a 50 y.o. male with history of Vfib s/p AICD placement, HTN, HLD, gout,  asthma, mild chronic pancreatitis, symptomatic cholelithiasis and a well differentiated neuroendocrine tumor of the mesentery adjacent to the head and uncinate process of the pancreas who presents with concern for acute cholecystitis.   Recommendations include: -Admit to General Surgery -IV fluids -IV ceftriaxone -trend LFTs/lipase -Cardiology consult for preop clearance and management of AICD during the laparoscopic cholecystectomy -Laparoscopic cholecystectomy this admission  Konstantinos Economopoulos 08/21/2020, 8:42 PM

## 2020-08-21 NOTE — ED Provider Notes (Signed)
[facility] EMERGENCY DEPARTMENT Provider Note   CSN: [id number] Arrival date & time: 08/21/20  1000     History Chief Complaint  Patient presents with  . Abdominal Pain    [name] is a 50 y.o. male.  Patient c/o abdominal pain and nausea/vomting/diarrhea in the past three days. Symptoms acute onset, comes and goes, but more constant today, dull, mod-severe, non radiating, without specific exacerbating or alleviating factors. Notes hx gallstones, and epigastric mass - says mass is 'benign', and that he was told it hasnt change in size w imaging - states has f/u with gi next week. Denies fever/chills. No back or flank pain. No dysuria or gu c/o. No scrotal or testicular pain. Emesis episodic, not bloody or bilious. Diarrhea loose, no blood, mild. Denies any chest pain or discomfort. No sob or uri symptoms.   The history is provided by the patient.  Abdominal Pain Associated symptoms: diarrhea, nausea and vomiting   Associated symptoms: no chest pain, no chills, no cough, no dysuria, no fever, no shortness of breath and no sore throat        Past Medical History:  Diagnosis Date  . AICD (automatic cardioverter/defibrillator) present   . Dyslipidemia   . Heart failure with mildly reduced EF    EF 35-40 in 2011 // Echo 06/2019: EF 45-50, Gr 2 DD, RAE, mild MR, mild TR, mild PI, RVSP 33.6  . History of kidney stones   . HTN (hypertension)   . Hx of ventricular fibrillation    Hx of VFib arrest s/p AICD placement  . Nonischemic cardiomyopathy     Patient Active Problem List   Diagnosis Date Noted  . Gout 07/03/2020  . Cholelithiasis 05/27/2020  . Abdominal mass 05/25/2020  . Dermatitis 02/10/2017  . Nephrolithiasis 02/10/2017  . HTN (hypertension) 09/22/2015  . Constipation 09/22/2015  . Asthma, intermittent 02/04/2014  . Ventricular fibrillation ([place]) 11/09/2013  . Environmental allergies 10/11/2013  . IFG (impaired fasting glucose) 10/11/2013  .  Hyperlipidemia 10/11/2013  . Chronic systolic heart failure ([place]) 05/26/2010  . Automatic implantable cardioverter-defibrillator in situ 05/26/2010    Past Surgical History:  Procedure Laterality Date  . CARDIAC DEFIBRILLATOR PLACEMENT    . ERCP N/A 05/27/2020   Procedure: ENDOSCOPIC RETROGRADE CHOLANGIOPANCREATOGRAPHY (ERCP);  Surgeon: Clarene Essex, MD;  Location: [facility];  Service: Endoscopy;  Laterality: N/A;  . ESOPHAGOGASTRODUODENOSCOPY (EGD) WITH PROPOFOL N/A 06/25/2020   Procedure: ESOPHAGOGASTRODUODENOSCOPY (EGD) WITH PROPOFOL;  Surgeon: Arta Silence, MD;  Location: [facility] ENDOSCOPY;  Service: Endoscopy;  Laterality: N/A;  . FINE NEEDLE ASPIRATION N/A 06/25/2020   Procedure: FINE NEEDLE ASPIRATION (FNA) LINEAR;  Surgeon: Arta Silence, MD;  Location: [facility] ENDOSCOPY;  Service: Endoscopy;  Laterality: N/A;  . PACEMAKER IMPLANT    . REMOVAL OF STONES  05/27/2020   Procedure: REMOVAL OF STONES;  Surgeon: Clarene Essex, MD;  Location: [facility] ENDOSCOPY;  Service: Endoscopy;;  . repeat echo  2008    demonstrated near normalization  . SPHINCTEROTOMY  05/27/2020   Procedure: SPHINCTEROTOMY;  Surgeon: Clarene Essex, MD;  Location: [facility] ENDOSCOPY;  Service: Endoscopy;;  . TRANSTHORACIC ECHOCARDIOGRAM  11/09/2005, 01/15/2009, 02/07/2010  . UPPER ESOPHAGEAL ENDOSCOPIC ULTRASOUND (EUS) N/A 06/25/2020   Procedure: UPPER ESOPHAGEAL ENDOSCOPIC ULTRASOUND (EUS);  Surgeon: Arta Silence, MD;  Location: [facility] ENDOSCOPY;  Service: Endoscopy;  Laterality: N/A;       Family History  Problem Relation Age of Onset  . Diabetes Father   . Heart Problems Father  unsure of what kind  . Asthma Maternal Uncle   . Diabetes Maternal Grandmother     Social History   Tobacco Use  . Smoking status: Former Smoker    Packs/day: 0.25    Years: 2.00    Pack years: 0.50    Types: Cigarettes    Quit date: 07/12/1998    Years since quitting: 22.1  . Smokeless tobacco: Never Used  Vaping Use  . Vaping Use:  Never used  Substance Use Topics  . Alcohol use: No    Comment: Denies  . Drug use: No    Home Medications Prior to Admission medications   Medication Sig Start Date End Date Taking? Authorizing Provider  allopurinol (ZYLOPRIM) 100 MG tablet Take one tablet by mouth daily for 1 week and then 2 tablets by mouth daily 07/03/20   Swords, Darrick Penna, MD  amiodarone (PACERONE) 200 MG tablet Take 1 tablet (200 mg total) by mouth daily. 07/30/20   Evans Lance, MD  aspirin 81 MG tablet Take 1 tablet (81 mg total) by mouth 2 (two) times daily. 09/22/15   Funches, Adriana Mccallum, MD  bisoprolol (ZEBETA) 10 MG tablet Take 1 tablet (10 mg total) by mouth daily. 06/25/20   Evans Lance, MD  budesonide-formoterol [facility]) 80-4.5 MCG/ACT inhaler Inhale 2 puffs into the lungs 2 (two) times daily. Must keep upcoming office visit for refills Patient taking differently: Inhale 2 puffs into the lungs 2 (two) times daily as needed (for flares). 11/09/19   Fulp, Cammie, MD  rosuvastatin (CRESTOR) 10 MG tablet Take 1 tablet (10 mg total) by mouth daily. To lower cholesterol 07/22/20   Gildardo Pounds, NP  sacubitril-valsartan (ENTRESTO) 24-26 MG Take 1 tablet by mouth 2 (two) times daily. 06/25/20   Evans Lance, MD    Allergies    Ace inhibitors  Review of Systems   Review of Systems  Constitutional: Negative for chills and fever.  HENT: Negative for sore throat.   Eyes: Negative for redness.  Respiratory: Negative for cough and shortness of breath.   Cardiovascular: Negative for chest pain.  Gastrointestinal: Positive for abdominal pain, diarrhea, nausea and vomiting.  Genitourinary: Negative for dysuria and flank pain.  Musculoskeletal: Negative for back pain and neck pain.  Skin: Negative for rash.  Neurological: Negative for headaches.  Hematological: Does not bruise/bleed easily.  Psychiatric/Behavioral: Negative for confusion.    Physical Exam Updated Vital Signs BP (!) 142/83   Pulse (!)  41   Temp 98.8 F (37.1 C)   Resp (!) 23   SpO2 99%   Physical Exam Vitals and nursing note reviewed.  Constitutional:      Appearance: Normal appearance. He is well-developed.  HENT:     Head: Atraumatic.     Nose: Nose normal.     Mouth/Throat:     Mouth: Mucous membranes are moist.     Pharynx: Oropharynx is clear.  Eyes:     General: No scleral icterus.    Conjunctiva/sclera: Conjunctivae normal.     Pupils: Pupils are equal, round, and reactive to light.  Neck:     Trachea: No tracheal deviation.  Cardiovascular:     Rate and Rhythm: Normal rate and regular rhythm.     Pulses: Normal pulses.     Heart sounds: Normal heart sounds. No murmur heard. No friction rub. No gallop.   Pulmonary:     Effort: Pulmonary effort is normal. No accessory muscle usage or respiratory distress.  Breath sounds: Normal breath sounds.  Abdominal:     General: Bowel sounds are normal. There is no distension.     Palpations: Abdomen is soft.     Tenderness: There is abdominal tenderness. There is no guarding.     Comments: Epigastric and mid abd tenderness. No rebound or guarding. No incarcerated hernia.   Genitourinary:    Comments: No cva tenderness. Musculoskeletal:        General: No swelling.     Cervical back: Normal range of motion and neck supple. No rigidity.  Skin:    General: Skin is warm and dry.     Findings: No rash.  Neurological:     Mental Status: He is alert.     Comments: Alert, speech clear.   Psychiatric:        Mood and Affect: Mood normal.      ED Results / Procedures / Treatments   Labs (all labs ordered are listed, but only abnormal results are displayed) Results for orders placed or performed during the hospital encounter of 08/21/20  Lipase, blood  Result Value Ref Range   Lipase 195 (H) 11 - 51 U/L  Comprehensive metabolic panel  Result Value Ref Range   Sodium 138 135 - 145 mmol/L   Potassium 4.1 3.5 - 5.1 mmol/L   Chloride 106 98 - 111  mmol/L   CO2 22 22 - 32 mmol/L   Glucose, Bld 98 70 - 99 mg/dL   BUN 11 6 - 20 mg/dL   Creatinine, Ser 1.07 0.61 - 1.24 mg/dL   Calcium 8.7 (L) 8.9 - 10.3 mg/dL   Total Protein 6.7 6.5 - 8.1 g/dL   Albumin 3.7 3.5 - 5.0 g/dL   AST 29 15 - 41 U/L   ALT 31 0 - 44 U/L   Alkaline Phosphatase 64 38 - 126 U/L   Total Bilirubin 0.9 0.3 - 1.2 mg/dL   GFR, Estimated >60 >60 mL/min   Anion gap 10 5 - 15  CBC  Result Value Ref Range   WBC 7.2 4.0 - 10.5 K/uL   RBC 5.56 4.22 - 5.81 MIL/uL   Hemoglobin 15.7 13.0 - 17.0 g/dL   HCT 47.4 39.0 - 52.0 %   MCV 85.3 80.0 - 100.0 fL   MCH 28.2 26.0 - 34.0 pg   MCHC 33.1 30.0 - 36.0 g/dL   RDW 14.7 11.5 - 15.5 %   Platelets 327 150 - 400 K/uL   nRBC 0.0 0.0 - 0.2 %  Urinalysis, Routine w reflex microscopic Urine, Clean Catch  Result Value Ref Range   Color, Urine YELLOW YELLOW   APPearance CLEAR CLEAR   Specific Gravity, Urine >1.030 (H) 1.005 - 1.030   pH 5.0 5.0 - 8.0   Glucose, UA NEGATIVE NEGATIVE mg/dL   Hgb urine dipstick TRACE (A) NEGATIVE   Bilirubin Urine NEGATIVE NEGATIVE   Ketones, ur NEGATIVE NEGATIVE mg/dL   Protein, ur 100 (A) NEGATIVE mg/dL   Nitrite NEGATIVE NEGATIVE   Leukocytes,Ua NEGATIVE NEGATIVE  Urinalysis, Microscopic (reflex)  Result Value Ref Range   RBC / HPF 0-5 0 - 5 RBC/hpf   WBC, UA 0-5 0 - 5 WBC/hpf   Bacteria, UA RARE (A) NONE SEEN   Squamous Epithelial / LPF 0-5 0 - 5   Mucus PRESENT    CT Abdomen Pelvis W Contrast  Result Date: 08/21/2020 CLINICAL DATA:  Severe abdominal pain EXAM: CT ABDOMEN AND PELVIS WITH CONTRAST TECHNIQUE: Multidetector CT imaging of the abdomen  and pelvis was performed using the standard protocol following bolus administration of intravenous contrast. CONTRAST:  113mL OMNIPAQUE IOHEXOL 300 MG/ML  SOLN COMPARISON:  05/24/2020 FINDINGS: Lower chest: Mild bibasilar atelectatic changes are noted. Hepatobiliary: Liver demonstrates mild fatty infiltration. Gallbladder is well distended  with multiple gallstones within. Pancreas: Pancreas is well visualized and within normal limits with the exception on the centrally necrotic mass lesion adjacent to the head and uncinate process of the pancreas. This is stable in appearance from the prior exam. Spleen: Normal in size without focal abnormality. Adrenals/Urinary Tract: Adrenal glands are within normal limits. Kidneys demonstrate a normal enhancement pattern. No renal calculi or obstructive changes are noted. The bladder is partially distended. Stomach/Bowel: Appendix is within normal limits. No obstructive or inflammatory changes of the colon are seen. Small bowel and stomach appear within normal limits. Vascular/Lymphatic: No significant vascular findings are present. No enlarged abdominal or pelvic lymph nodes. Reproductive: Prostate is unremarkable. Other: No abdominal wall hernia or abnormality. No abdominopelvic ascites. Musculoskeletal: No acute or significant osseous findings. IMPRESSION: Stable mass lesion adjacent to the head and uncinate process of the pancreas. This is stable dating back to 2019 and has been recently biopsied. Cholelithiasis without complicating factors. Mild fatty infiltration of the liver. Electronically Signed   By: Inez Catalina M.D.   On: 08/21/2020 14:42   CUP PACEART REMOTE DEVICE CHECK  Result Date: 07/28/2020 Scheduled remote reviewed. Normal device function.  Est time to ERI = 7.6 months -routing to triage to increase follow up Next remote 31 days. HB  US Abdomen Limited RUQ (LIVER/GB)  Result Date: 08/21/2020 CLINICAL DATA:  Abdominal pain.  Rule out cholecystitis. EXAM: ULTRASOUND ABDOMEN LIMITED RIGHT UPPER QUADRANT COMPARISON:  None. FINDINGS: Gallbladder: Gallstones noted within the gallbladder. There is wall thickening measuring up to 12 mm and pericholecystic fluid. Ring down artifact noted from the anterior gallbladder wall compatible with adenomyomatosis. The patient was not tender over the  gallbladder during the study. Common bile duct: Diameter: Normal caliber, 3 mm Liver: No focal lesion identified. Within normal limits in parenchymal echogenicity. Portal vein is patent on color Doppler imaging with normal direction of blood flow towards the liver. Other: None. IMPRESSION: Cholelithiasis. Changes of adenomyomatosis. Wall thickening and pericholecystic fluid without sonographic Murphy sign. Recommend clinical correlation to exclude acute cholecystitis. Electronically Signed   By: Rolm Baptise M.D.   On: 08/21/2020 19:23    EKG EKG Interpretation  Date/Time:  Thursday August 21 2020 16:55:01 EST Ventricular Rate:  41 PR Interval:    QRS Duration: 192 QT Interval:  588 QTC Calculation: 480 R Axis:   -68 Text Interpretation: Sinus bradycardia Short PR interval Electronic ventricular pacemaker Confirmed by Lajean Saver [phone]) on 08/21/2020 5:44:32 PM   Radiology CT Abdomen Pelvis W Contrast  Result Date: 08/21/2020 CLINICAL DATA:  Severe abdominal pain EXAM: CT ABDOMEN AND PELVIS WITH CONTRAST TECHNIQUE: Multidetector CT imaging of the abdomen and pelvis was performed using the standard protocol following bolus administration of intravenous contrast. CONTRAST:  166mL OMNIPAQUE IOHEXOL 300 MG/ML  SOLN COMPARISON:  05/24/2020 FINDINGS: Lower chest: Mild bibasilar atelectatic changes are noted. Hepatobiliary: Liver demonstrates mild fatty infiltration. Gallbladder is well distended with multiple gallstones within. Pancreas: Pancreas is well visualized and within normal limits with the exception on the centrally necrotic mass lesion adjacent to the head and uncinate process of the pancreas. This is stable in appearance from the prior exam. Spleen: Normal in size without focal abnormality. Adrenals/Urinary Tract: Adrenal glands are  within normal limits. Kidneys demonstrate a normal enhancement pattern. No renal calculi or obstructive changes are noted. The bladder is partially distended.  Stomach/Bowel: Appendix is within normal limits. No obstructive or inflammatory changes of the colon are seen. Small bowel and stomach appear within normal limits. Vascular/Lymphatic: No significant vascular findings are present. No enlarged abdominal or pelvic lymph nodes. Reproductive: Prostate is unremarkable. Other: No abdominal wall hernia or abnormality. No abdominopelvic ascites. Musculoskeletal: No acute or significant osseous findings. IMPRESSION: Stable mass lesion adjacent to the head and uncinate process of the pancreas. This is stable dating back to 2019 and has been recently biopsied. Cholelithiasis without complicating factors. Mild fatty infiltration of the liver. Electronically Signed   By: Inez Catalina M.D.   On: 08/21/2020 14:42   US Abdomen Limited RUQ (LIVER/GB)  Result Date: 08/21/2020 CLINICAL DATA:  Abdominal pain.  Rule out cholecystitis. EXAM: ULTRASOUND ABDOMEN LIMITED RIGHT UPPER QUADRANT COMPARISON:  None. FINDINGS: Gallbladder: Gallstones noted within the gallbladder. There is wall thickening measuring up to 12 mm and pericholecystic fluid. Ring down artifact noted from the anterior gallbladder wall compatible with adenomyomatosis. The patient was not tender over the gallbladder during the study. Common bile duct: Diameter: Normal caliber, 3 mm Liver: No focal lesion identified. Within normal limits in parenchymal echogenicity. Portal vein is patent on color Doppler imaging with normal direction of blood flow towards the liver. Other: None. IMPRESSION: Cholelithiasis. Changes of adenomyomatosis. Wall thickening and pericholecystic fluid without sonographic Murphy sign. Recommend clinical correlation to exclude acute cholecystitis. Electronically Signed   By: Rolm Baptise M.D.   On: 08/21/2020 19:23    Procedures Procedures   Medications Ordered in ED Medications  sodium chloride 0.9 % bolus 1,000 mL (has no administration in time range)  HYDROmorphone (DILAUDID) injection 1  mg (has no administration in time range)  ondansetron (ZOFRAN) injection 4 mg (has no administration in time range)  famotidine (PEPCID) IVPB 20 mg premix (has no administration in time range)  iohexol (OMNIPAQUE) 300 MG/ML solution 100 mL (100 mLs Intravenous Contrast Given 08/21/20 1425)    ED Course  I have reviewed the triage vital signs and the nursing notes.  Pertinent labs & imaging results that were available during my care of the patient were reviewed by me and considered in my medical decision making (see chart for details).    MDM Rules/Calculators/A&P                         Iv ns. Stat labs and imaging.   Reviewed nursing notes and prior charts for additional history.   MDM Number of Diagnoses or Management Options   Amount and/or Complexity of Data Reviewed Clinical lab tests: ordered and reviewed Tests in the radiology section of CPT: ordered and reviewed Tests in the medicine section of CPT: ordered and reviewed Discussion of test results with the performing providers: yes Decide to obtain previous medical records or to obtain history from someone other than the patient: yes Obtain history from someone other than the patient: yes Review and summarize past medical records: yes Discuss the patient with other providers: yes Independent visualization of images, tracings, or specimens: yes  Risk of Complications, Morbidity, and/or Mortality Presenting problems: high Diagnostic procedures: high Management options: high  Pt requests pain medication. Dilaudid 1 mg iv. zofran iv. pepcid iv.   Labs reviewed/interpreted by me - wbc elev. Lipase mildly elevated.   CT reviewed/interpreted by me - gallstones. +mass. No  acute infection noted.   As pain and upper abd tenderness persists - will get u/s.  U/s reviewed/interpreted by me - gallstones ?choleystitis. General surgery consulted - discussed pt and u/s - will see in ED.    Iv abx given.  Recheck pain improved  w meds.      Final Clinical Impression(s) / ED Diagnoses Final diagnoses:  None    Rx / DC Orders ED Discharge Orders    None       Lajean Saver, MD 08/21/20 2023

## 2020-08-22 ENCOUNTER — Inpatient Hospital Stay (HOSPITAL_COMMUNITY): Payer: Managed Care, Other (non HMO) | Admitting: Anesthesiology

## 2020-08-22 ENCOUNTER — Encounter (HOSPITAL_COMMUNITY)
Admission: EM | Disposition: A | Discharge status: Home / Self Care | Payer: Self-pay | Source: Home / Self Care | Attending: Emergency Medicine

## 2020-08-22 LAB — CBC
HCT: 46.6 % (ref 39.0–52.0)
Hemoglobin: 16 g/dL (ref 13.0–17.0)
MCH: 29.2 pg (ref 26.0–34.0)
MCHC: 34.3 g/dL (ref 30.0–36.0)
MCV: 85 fL (ref 80.0–100.0)
Platelets: 333 10*3/uL (ref 150–400)
RBC: 5.48 MIL/uL (ref 4.22–5.81)
RDW: 14.8 % (ref 11.5–15.5)
WBC: 10.4 10*3/uL (ref 4.0–10.5)
nRBC: 0 % (ref 0.0–0.2)

## 2020-08-22 LAB — MAGNESIUM: Magnesium: 1.9 mg/dL (ref 1.7–2.4)

## 2020-08-22 LAB — COMPREHENSIVE METABOLIC PANEL
ALT: 28 U/L (ref 0–44)
AST: 18 U/L (ref 15–41)
Albumin: 3.3 g/dL — ABNORMAL LOW (ref 3.5–5.0)
Alkaline Phosphatase: 55 U/L (ref 38–126)
Anion gap: 9 (ref 5–15)
BUN: 10 mg/dL (ref 6–20)
CO2: 26 mmol/L (ref 22–32)
Calcium: 9.1 mg/dL (ref 8.9–10.3)
Chloride: 104 mmol/L (ref 98–111)
Creatinine, Ser: 1.18 mg/dL (ref 0.61–1.24)
GFR, Estimated: >60 mL/min (ref >60)
Glucose, Bld: 116 mg/dL — ABNORMAL HIGH (ref 70–99)
Potassium: 4.4 mmol/L (ref 3.5–5.1)
Sodium: 139 mmol/L (ref 135–145)
Total Bilirubin: 0.9 mg/dL (ref 0.3–1.2)
Total Protein: 6.2 g/dL — ABNORMAL LOW (ref 6.5–8.1)

## 2020-08-22 LAB — LIPASE, BLOOD: Lipase: 23 U/L (ref 11–51)

## 2020-08-22 LAB — SARS CORONAVIRUS 2 (TAT 6-24 HRS): SARS Coronavirus 2: NEGATIVE

## 2020-08-22 SURGERY — LAPAROSCOPIC CHOLECYSTECTOMY WITH INTRAOPERATIVE CHOLANGIOGRAM
Anesthesia: General

## 2020-08-22 MED ORDER — OXYCODONE HCL 5 MG PO TABS: 5.0000 mg | ORAL_TABLET | Freq: Four times a day (QID) | ORAL | 0 refills | Status: AC | PRN

## 2020-08-22 MED ORDER — AMOXICILLIN-POT CLAVULANATE 875-125 MG PO TABS: 1.0000 | ORAL_TABLET | Freq: Two times a day (BID) | ORAL | 0 refills | Status: AC

## 2020-08-22 MED ORDER — MIDAZOLAM HCL 2 MG/2ML IJ SOLN
INTRAMUSCULAR | Status: AC
  Filled 2020-08-22: qty 2

## 2020-08-22 MED ORDER — IBUPROFEN 800 MG PO TABS: 800.0000 mg | ORAL_TABLET | Freq: Three times a day (TID) | ORAL | 1 refills | Status: DC | PRN

## 2020-08-22 MED ORDER — ACETAMINOPHEN 500 MG PO TABS: 1000.0000 mg | ORAL_TABLET | Freq: Four times a day (QID) | ORAL | 1 refills | Status: AC

## 2020-08-22 MED ORDER — FENTANYL CITRATE (PF) 250 MCG/5ML IJ SOLN
INTRAMUSCULAR | Status: AC
  Filled 2020-08-22: qty 5

## 2020-08-22 MED ORDER — PROPOFOL 10 MG/ML IV BOLUS
INTRAVENOUS | Status: AC
  Filled 2020-08-22: qty 20

## 2020-08-22 NOTE — Progress Notes (Signed)
Patient seen and examined. With normal vitals and normal WBC, this could be symptomatic cholelithiasis rather than acute cholecystitis, although ultrasound imaging is in favor of acute cholecystitis. He has a known pancreatic head neuroendocrine tumor and is currenty undergoing workup for possible Whipple. Given his cardiac history and AICD, I think his anesthetic/peri-operative risk is in favor of a single anesthetic event for a Whipple which will include a cholecystectomy instead of two, a cholecystectomy followed by a Whipple. This was communicated to the patient and he verbalized understanding.   I have scheduled the patient for an office appointment with Dr. Zenia Resides, hepatobiliary surgeon, on 08/26/2020 at 0930am. The patient was provided this information and our office's contact information. He was previously scheduled and was a no-show, he states due to needing to care for his ill father, who has since passed away. He denies any current transportation, financial, or other barriers to being able to make this appointment. I have discussed this patient with Dr. Zenia Resides and she is in agreement with this plan.   Given that early cholecystitis cannot be completely ruled out, I have sent a prescription for augmentin x5d in addition to prescription pain medications. He will have stool samples sent prior to discharge given his recent travel and diarrhea.    [name], MD General and [facility]

## 2020-08-22 NOTE — Anesthesia Preprocedure Evaluation (Signed)
Anesthesia Evaluation  Patient identified by MRN, date of birth, ID band Patient awake    Reviewed: Allergy & Precautions, H&P , NPO status , Patient's Chart, lab work & pertinent test results, reviewed documented beta blocker date and time   Airway Mallampati: II  TM Distance: >3 FB Neck ROM: Full    Dental  (+) Teeth Intact, Dental Advisory Given   Pulmonary asthma , former smoker,  Quit smoking 2000, previously light smoker    Pulmonary exam normal breath sounds clear to auscultation       Cardiovascular hypertension, Pt. on medications and Pt. on home beta blockers +CHF (LVEF 88-50%, grade 3 diastolic dysfunction)  Normal cardiovascular exam+ dysrhythmias (hx Vfib arrest s/p AICD placement, subsequent NICM) Ventricular Fibrillation + Cardiac Defibrillator  Rhythm:Regular Rate:Normal  Last echo 05/2020: 1. Left ventricular ejection fraction, by estimation, is 45 to 50%. The  left ventricle has mildly decreased function. The left ventricle  demonstrates global hypokinesis. The left ventricular internal cavity size  was mildly dilated. There is mild  concentric left ventricular hypertrophy. Left ventricular diastolic  parameters are consistent with Grade III diastolic dysfunction  (restrictive). Elevated left ventricular end-diastolic pressure.  2. Right ventricular systolic function is normal. The right ventricular  size is normal. There is normal pulmonary artery systolic pressure. The  estimated right ventricular systolic pressure is 27.7 mmHg.  3. Left atrial size was moderately dilated.  4. The mitral valve is normal in structure. Trivial mitral valve  regurgitation. No evidence of mitral stenosis.  5. The aortic valve is normal in structure. Aortic valve regurgitation is  not visualized. Mild aortic valve sclerosis is present, with no evidence  of aortic valve stenosis.  6. The inferior vena cava is normal in size  with greater than 50%  respiratory variability, suggesting right atrial pressure of 3 mmHg.    Neuro/Psych negative neurological ROS  negative psych ROS   GI/Hepatic Neg liver ROS, Pancreatic lesion    Endo/Other  Obesity BMI 30  Renal/GU negative Renal ROS  negative genitourinary   Musculoskeletal negative musculoskeletal ROS (+)   Abdominal   Peds negative pediatric ROS (+)  Hematology negative hematology ROS (+)   Anesthesia Other Findings   Reproductive/Obstetrics negative OB ROS                             Anesthesia Physical  Anesthesia Plan  ASA: III  Anesthesia Plan: General   Post-op Pain Management:    Induction: Intravenous  PONV Risk Score and Plan: 2 and Ondansetron and Dexamethasone  Airway Management Planned: Oral ETT  Additional Equipment: None  Intra-op Plan:   Post-operative Plan: Extubation in OR  Informed Consent: I have reviewed the patients History and Physical, chart, labs and discussed the procedure including the risks, benefits and alternatives for the proposed anesthesia with the patient or authorized representative who has indicated his/her understanding and acceptance.       Plan Discussed with: CRNA and Anesthesiologist  Anesthesia Plan Comments: (Will have magnet available in room )        Anesthesia Quick Evaluation

## 2020-08-22 NOTE — Discharge Instructions (Signed)
Pain regimen: take over-the-counter tylenol (acetaminophen) 1000mg  every six hours and the prescription ibuprofen (800mg ) every eight hours. You also have a prescription for oxycodone, which should be taken if the tylenol (acetaminophen) and ibuprofen are not enough to control your pain. You may take the oxycodone as frequently as every six hours as needed.

## 2020-08-23 ENCOUNTER — Other Ambulatory Visit: Payer: Self-pay | Admitting: Internal Medicine

## 2020-08-23 DIAGNOSIS — I5022 Chronic systolic (congestive) heart failure: Secondary | ICD-10-CM

## 2020-08-23 DIAGNOSIS — I10 Essential (primary) hypertension: Secondary | ICD-10-CM

## 2020-08-26 ENCOUNTER — Ambulatory Visit: Payer: Managed Care, Other (non HMO) | Admitting: Gastroenterology

## 2020-08-26 ENCOUNTER — Encounter (HOSPITAL_COMMUNITY): Payer: Self-pay

## 2020-08-26 ENCOUNTER — Other Ambulatory Visit: Payer: Self-pay

## 2020-08-26 ENCOUNTER — Observation Stay (HOSPITAL_COMMUNITY)
Admission: AD | Admit: 2020-08-26 | Discharge: 2020-08-27 | Disposition: A | Discharge status: Home / Self Care | Payer: Managed Care, Other (non HMO) | Source: Ambulatory Visit | Attending: General Surgery | Admitting: General Surgery

## 2020-08-26 ENCOUNTER — Ambulatory Visit: Payer: Self-pay | Admitting: Surgery

## 2020-08-26 DIAGNOSIS — Z79899 Other long term (current) drug therapy: Secondary | ICD-10-CM | POA: Diagnosis not present

## 2020-08-26 DIAGNOSIS — I110 Hypertensive heart disease with heart failure: Secondary | ICD-10-CM | POA: Insufficient documentation

## 2020-08-26 DIAGNOSIS — I5020 Unspecified systolic (congestive) heart failure: Secondary | ICD-10-CM | POA: Insufficient documentation

## 2020-08-26 DIAGNOSIS — I428 Other cardiomyopathies: Secondary | ICD-10-CM | POA: Diagnosis not present

## 2020-08-26 DIAGNOSIS — Z7982 Long term (current) use of aspirin: Secondary | ICD-10-CM | POA: Diagnosis not present

## 2020-08-26 DIAGNOSIS — Z87891 Personal history of nicotine dependence: Secondary | ICD-10-CM | POA: Insufficient documentation

## 2020-08-26 DIAGNOSIS — E785 Hyperlipidemia, unspecified: Secondary | ICD-10-CM | POA: Insufficient documentation

## 2020-08-26 DIAGNOSIS — K810 Acute cholecystitis: Secondary | ICD-10-CM | POA: Diagnosis not present

## 2020-08-26 DIAGNOSIS — K8000 Calculus of gallbladder with acute cholecystitis without obstruction: Secondary | ICD-10-CM | POA: Diagnosis present

## 2020-08-26 LAB — COMPREHENSIVE METABOLIC PANEL
ALT: 14 U/L (ref 0–44)
AST: 16 U/L (ref 15–41)
Albumin: 2.4 g/dL — ABNORMAL LOW (ref 3.5–5.0)
Alkaline Phosphatase: 69 U/L (ref 38–126)
Anion gap: 12 (ref 5–15)
BUN: 10 mg/dL (ref 6–20)
CO2: 25 mmol/L (ref 22–32)
Calcium: 8.7 mg/dL — ABNORMAL LOW (ref 8.9–10.3)
Chloride: 100 mmol/L (ref 98–111)
Creatinine, Ser: 1.21 mg/dL (ref 0.61–1.24)
GFR, Estimated: >60 mL/min (ref >60)
Glucose, Bld: 101 mg/dL — ABNORMAL HIGH (ref 70–99)
Potassium: 3.3 mmol/L — ABNORMAL LOW (ref 3.5–5.1)
Sodium: 137 mmol/L (ref 135–145)
Total Bilirubin: 1.5 mg/dL — ABNORMAL HIGH (ref 0.3–1.2)
Total Protein: 6.9 g/dL (ref 6.5–8.1)

## 2020-08-26 LAB — CBC
HCT: 44.2 % (ref 39.0–52.0)
Hemoglobin: 15.2 g/dL (ref 13.0–17.0)
MCH: 28.8 pg (ref 26.0–34.0)
MCHC: 34.4 g/dL (ref 30.0–36.0)
MCV: 83.9 fL (ref 80.0–100.0)
Platelets: 451 10*3/uL — ABNORMAL HIGH (ref 150–400)
RBC: 5.27 MIL/uL (ref 4.22–5.81)
RDW: 14.4 % (ref 11.5–15.5)
WBC: 12 10*3/uL — ABNORMAL HIGH (ref 4.0–10.5)
nRBC: 0 % (ref 0.0–0.2)

## 2020-08-26 LAB — PROTIME-INR
INR: 1.2 (ref 0.8–1.2)
Prothrombin Time: 14.9 seconds (ref 11.4–15.2)

## 2020-08-26 MED ORDER — MORPHINE SULFATE (PF) 2 MG/ML IV SOLN: 1.0000 mg | INTRAVENOUS | Status: DC | PRN

## 2020-08-26 MED ORDER — TRAMADOL HCL 50 MG PO TABS
50.0000 mg | ORAL_TABLET | Freq: Four times a day (QID) | ORAL | Status: DC | PRN
Start: 2020-08-26 — End: 2020-08-28
  Administered 2020-08-26: 50 mg via ORAL
  Filled 2020-08-26: qty 1

## 2020-08-26 MED ORDER — BISOPROLOL FUMARATE 5 MG PO TABS
10.0000 mg | ORAL_TABLET | Freq: Every day | ORAL | Status: DC
  Administered 2020-08-27: 10 mg via ORAL
  Filled 2020-08-26: qty 2

## 2020-08-26 MED ORDER — IBUPROFEN 600 MG PO TABS
600.0000 mg | ORAL_TABLET | Freq: Four times a day (QID) | ORAL | Status: DC | PRN
  Administered 2020-08-26: 600 mg via ORAL
  Filled 2020-08-26: qty 1

## 2020-08-26 MED ORDER — ASPIRIN EC 81 MG PO TBEC
81.0000 mg | DELAYED_RELEASE_TABLET | Freq: Every day | ORAL | Status: DC
  Administered 2020-08-27: 81 mg via ORAL
  Filled 2020-08-26: qty 1

## 2020-08-26 MED ORDER — SODIUM CHLORIDE 0.9 % IV SOLN
2.0000 g | INTRAVENOUS | Status: DC
  Administered 2020-08-26: 2 g via INTRAVENOUS
  Filled 2020-08-26: qty 20
  Filled 2020-08-26: qty 2

## 2020-08-26 MED ORDER — ACETAMINOPHEN 325 MG PO TABS: 650.0000 mg | ORAL_TABLET | Freq: Four times a day (QID) | ORAL | Status: DC | PRN

## 2020-08-26 MED ORDER — ONDANSETRON HCL 4 MG/2ML IJ SOLN: 4.0000 mg | Freq: Four times a day (QID) | INTRAMUSCULAR | Status: DC | PRN

## 2020-08-26 MED ORDER — AMIODARONE HCL 200 MG PO TABS: 200.0000 mg | ORAL_TABLET | Freq: Every day | ORAL | Status: DC

## 2020-08-26 MED ORDER — LACTATED RINGERS IV SOLN: INTRAVENOUS | Status: DC

## 2020-08-26 MED ORDER — ONDANSETRON 4 MG PO TBDP: 4.0000 mg | ORAL_TABLET | Freq: Four times a day (QID) | ORAL | Status: DC | PRN

## 2020-08-26 MED ORDER — DOCUSATE SODIUM 100 MG PO CAPS
100.0000 mg | ORAL_CAPSULE | Freq: Two times a day (BID) | ORAL | Status: DC
  Filled 2020-08-26 (×2): qty 1

## 2020-08-26 MED ORDER — MOMETASONE FURO-FORMOTEROL FUM 100-5 MCG/ACT IN AERO
2.0000 | INHALATION_SPRAY | Freq: Two times a day (BID) | RESPIRATORY_TRACT | Status: DC
  Filled 2020-08-26: qty 8.8

## 2020-08-26 MED ORDER — ALLOPURINOL 100 MG PO TABS
200.0000 mg | ORAL_TABLET | Freq: Every day | ORAL | Status: DC
  Administered 2020-08-26 – 2020-08-27 (×2): 200 mg via ORAL
  Filled 2020-08-26 (×2): qty 2

## 2020-08-26 NOTE — H&P (Signed)
History of Present Illness ([name] L. Zenia Resides MD; 08/26/2020 11:26 AM) The patient is a 50 year old male who presents with an abdominal mass.HPI: [name] is a 50 yo male who was referred for evaluation of a neuroendocrine tumor. He presented in November 2021 with jaundice and abdominal pain and a CT scan showed a 4cm mass adjacent to the head of the pancreas. He underwent ERCP on 11/16, and sludge was swept from the bile duct. A sphincterotomy was performed. LFTs subsequently normalized. He was discharged home and underwent an outpatient EUS on 06/25/20. This showed extrinsic compression of the third portion of the duodenum, with an adjacent 4cm mass that appeared to be mesenteric. FNA was performed and showed a neuroendocrine tumor. The patient was referred to a family emergency and prior clinic cancellation due to weather, he has not been able to see me until now. He was in the ED a few days ago with RUQ abdominal pain, and a RUQ US showed pericholecystic fluid concerning for cholecystitis. He had normal WBC and vitals and was discharged and sent to clinic today. He is still having abdominal pain and says he has not been able to eat very much or go to work. Endorses chills.  The patient has a history of NICM with V-fib arrest, for which he had an ICD placed. He underwent cardiac evaluation last week for potential inpatient lap cholecystectomy, and was stratified as moderate risk for an abdominal procedure. He last had a remote device check of his ICD on 07/26/20, which showed normal function.  PMH: NICM, V-fib arrest (2011), HTN, CHF (last EF 45-50% on 05/25/20), HLD  PSH: ICD placement  FHx: diabetes (father), heart disease (father)  Social: Denies use of tobacco/EtOH/drugs    Past Surgical History Billy Forehand, CNA; 08/26/2020 9:42 AM) No pertinent past surgical history   Diagnostic Studies History Billy Forehand, CNA; 08/26/2020 9:42 AM) Colonoscopy  never  Allergies Billy Forehand,  CNA; 08/26/2020 9:42 AM) No Known Drug Allergies  [08/26/2020]: Allergies Reconciled   Medication History Billy Forehand, CNA; 08/26/2020 9:43 AM) oxyCODONE HCl (5MG  Tablet, Oral) Active. Allopurinol (100MG  Tablet, Oral) Active. Amoxicillin-Pot Clavulanate (875-125MG  Tablet, Oral) Active. Amiodarone HCl (200MG  Tablet, Oral) Active. Bisoprolol Fumarate (10MG  Tablet, Oral) Active. Entresto (24-26MG  Tablet, Oral) Active. Ciprofloxacin HCl (500MG  Tablet, Oral) Active. Ibuprofen (800MG  Tablet, Oral) Active. Hydrocortisone (Perianal) (2.5% Cream, External) Active. predniSONE (10MG  (21) Tab Ther Pack, Oral) Active. predniSONE (5MG  Tablet, Oral) Active. Symbicort (80-4.5MCG/ACT Aerosol, Inhalation) Active. predniSONE (20MG  Tablet, Oral) Active. Medications Reconciled  Other Problems Billy Forehand, CNA; 08/26/2020 9:42 AM) Cholelithiasis  Congestive Heart Failure  Hemorrhoids  High blood pressure  Hypercholesterolemia  Kidney Stone     Review of Systems Billy Forehand CNA; 08/26/2020 9:42 AM) General Present- Appetite Loss, Fatigue and Weight Loss. Not Present- Chills, Fever, Night Sweats and Weight Gain. Skin Not Present- Change in Wart/Mole, Dryness, Hives, Jaundice, New Lesions, Non-Healing Wounds, Rash and Ulcer. HEENT Not Present- Earache, Hearing Loss, Hoarseness, Nose Bleed, Oral Ulcers, Ringing in the Ears, Seasonal Allergies, Sinus Pain, Sore Throat, Visual Disturbances, Wears glasses/contact lenses and Yellow Eyes. Respiratory Not Present- Bloody sputum, Chronic Cough, Difficulty Breathing, Snoring and Wheezing. Breast Not Present- Breast Mass, Breast Pain, Nipple Discharge and Skin Changes. Cardiovascular Present- Rapid Heart Rate. Not Present- Chest Pain, Difficulty Breathing Lying Down, Leg Cramps, Palpitations, Shortness of Breath and Swelling of Extremities. Gastrointestinal Present- Abdominal Pain, Change in Bowel Habits, Chronic diarrhea, Gets  full quickly at meals, Hemorrhoids, Indigestion, Nausea and Vomiting. Not  Present- Bloating, Bloody Stool, Constipation, Difficulty Swallowing, Excessive gas and Rectal Pain. Male Genitourinary Present- Nocturia. Not Present- Blood in Urine, Change in Urinary Stream, Frequency, Impotence, Painful Urination, Urgency and Urine Leakage. Musculoskeletal Not Present- Back Pain, Joint Pain, Joint Stiffness, Muscle Pain, Muscle Weakness and Swelling of Extremities. Neurological Not Present- Decreased Memory, Fainting, Headaches, Numbness, Seizures, Tingling, Tremor, Trouble walking and Weakness. Psychiatric Not Present- Anxiety, Bipolar, Change in Sleep Pattern, Depression, Fearful and Frequent crying. Endocrine Not Present- Cold Intolerance, Excessive Hunger, Hair Changes, Heat Intolerance, Hot flashes and New Diabetes. Hematology Not Present- Blood Thinners, Easy Bruising, Excessive bleeding, Gland problems, HIV and Persistent Infections.  Vitals Billy Reams Alston CNA; 08/26/2020 9:43 AM) 08/26/2020 9:43 AM Weight: 220.5 lb Height: 74in Body Surface Area: 2.27 m Body Mass Index: 28.31 kg/m  Temp.: 97.72F  Pulse: 57 (Regular)  P.OX: 98% (Room air) BP: 110/70(Sitting, Left Arm, Standard)       Physical Exam ([name] L. Zenia Resides MD; 08/26/2020 11:27 AM) The physical exam findings are as follows: Note: Constitutional: No acute distress; conversant; no deformities Neuro: alert and oriented; cranial nerves grossly in tact; no focal deficits Eyes: Moist conjunctiva; anicteric sclerae; extraocular movements in tact Neck: Trachea midline Lungs: Normal respiratory effort; lungs clear to auscultation bilaterally; symmetric chest wall expansion CV: Regular rate and rhythm; no murmurs; no pitting edema GI: Abdomen soft, nondistended, focally tender to palpation in the RUQ; no surgical scars MSK: Normal gait and station; no clubbing/cyanosis Psychiatric: Appropriate affect; alert and oriented  3    Assessment & Plan ([name] L. Zenia Resides MD; 08/26/2020 11:33 AM) NEUROENDOCRINE TUMOR (D3A.8) Story: 50 yo male with a neuroendocrine tumor. I have personally reviewed his imagining. He has a well-circumscribed hypervascular mass adjacent to the head of the pancreas and third portion of the duodenum. This was present on a CT in 2019 but was not visible in 2013. At a size of 4cm resection is recommended due to malignant potential. It is difficult to tell definitively if this is originating from the duodenum or the mesentery of the transverse mesocolon, but I think the patient will most likely need a Whipple to resect this. It is possible he will also require a segmental colectomy. I discussed the details of a Whipple with the patient and his friend, and reviewed the risks including bleeding, infection, 15% risk of pancreatic fistula, risk of bile leak, GJ leak, and 10-15% risk of delayed gastric emptying. Patient is also at moderate risk for cardiac complications. He expressed understanding and agrees to proceed. Unfortunately he currently has acute cholecystitis, which needs to be addressed immediately. I would prefer not to perform two separate abdominal operations given his elevated cardiac risk and will thus plan to have a perc chole placed now, and will then schedule a Whipple within the next few weeks.   Michaelle Birks, MD [facility] General, Hepatobiliary and Pancreatic Surgery 08/26/20 11:34 AM

## 2020-08-27 ENCOUNTER — Observation Stay (HOSPITAL_COMMUNITY): Payer: Managed Care, Other (non HMO)

## 2020-08-27 DIAGNOSIS — K810 Acute cholecystitis: Secondary | ICD-10-CM | POA: Diagnosis not present

## 2020-08-27 HISTORY — PX: IR PERC CHOLECYSTOSTOMY: IMG2326

## 2020-08-27 MED ORDER — IBUPROFEN 600 MG PO TABS: 600.0000 mg | ORAL_TABLET | Freq: Four times a day (QID) | ORAL | Status: DC | PRN

## 2020-08-27 MED ORDER — FENTANYL CITRATE (PF) 100 MCG/2ML IJ SOLN
INTRAMUSCULAR | Status: AC
  Filled 2020-08-27: qty 2

## 2020-08-27 MED ORDER — LIDOCAINE HCL (PF) 1 % IJ SOLN
INTRAMUSCULAR | Status: AC | PRN
  Administered 2020-08-27: 10 mL

## 2020-08-27 MED ORDER — FENTANYL CITRATE (PF) 100 MCG/2ML IJ SOLN
INTRAMUSCULAR | Status: AC | PRN
  Administered 2020-08-27: 25 ug via INTRAVENOUS

## 2020-08-27 MED ORDER — LIDOCAINE HCL 1 % IJ SOLN
INTRAMUSCULAR | Status: AC
  Filled 2020-08-27: qty 20

## 2020-08-27 MED ORDER — ACETAMINOPHEN 325 MG PO TABS: 650.0000 mg | ORAL_TABLET | Freq: Four times a day (QID) | ORAL | Status: DC | PRN

## 2020-08-27 MED ORDER — COLCHICINE 0.6 MG PO TABS: 0.6000 mg | ORAL_TABLET | Freq: Two times a day (BID) | ORAL | 0 refills | Status: DC

## 2020-08-27 MED ORDER — MIDAZOLAM HCL 2 MG/2ML IJ SOLN
INTRAMUSCULAR | Status: AC
  Filled 2020-08-27: qty 2

## 2020-08-27 MED ORDER — SODIUM CHLORIDE 0.9 % IV SOLN
INTRAVENOUS | Status: AC | PRN
  Administered 2020-08-27: 250 mL via INTRAVENOUS

## 2020-08-27 MED ORDER — MIDAZOLAM HCL 2 MG/2ML IJ SOLN
INTRAMUSCULAR | Status: AC | PRN
  Administered 2020-08-27: 1 mg via INTRAVENOUS

## 2020-08-27 MED ORDER — AMOXICILLIN-POT CLAVULANATE 875-125 MG PO TABS: 1.0000 | ORAL_TABLET | Freq: Two times a day (BID) | ORAL | 0 refills | Status: AC

## 2020-08-27 MED ORDER — SODIUM CHLORIDE 0.9% FLUSH
5.0000 mL | Freq: Three times a day (TID) | INTRAVENOUS | Status: DC
  Administered 2020-08-27: 5 mL

## 2020-08-27 MED ORDER — IOHEXOL 300 MG/ML  SOLN
50.0000 mL | Freq: Once | INTRAMUSCULAR | Status: AC | PRN
  Administered 2020-08-27: 15 mL

## 2020-08-27 NOTE — Progress Notes (Signed)
Discharge instructions reviewed with pt and instructed on where to pick up prescriptions.  Also demonstrated to pt and pt's friend how to empty, measure and record drainage from drain and also how to flush drain.  Pt and pt's friend verbalized understanding and had no questions.  Pt discharged in stable condition via wheelchair with friend.  Eliezer Bottom [place]

## 2020-08-27 NOTE — Discharge Summary (Signed)
Patient ID: [name] [id number] 17-Oct-1970 50 y.o.  Admit date: 08/26/2020 Discharge date: 08/27/2020  Admitting Diagnosis: cholecystitis  Discharge Diagnosis Patient Active Problem List   Diagnosis Date Noted  . Calculus of gallbladder with acute cholecystitis 08/26/2020  . Acute cholecystitis 08/26/2020  . Symptomatic cholelithiasis 08/21/2020  . Gout 07/03/2020  . Cholelithiasis 05/27/2020  . Abdominal mass 05/25/2020  . Dermatitis 02/10/2017  . Nephrolithiasis 02/10/2017  . HTN (hypertension) 09/22/2015  . Constipation 09/22/2015  . Asthma, intermittent 02/04/2014  . Ventricular fibrillation ([address]) 11/09/2013  . Environmental allergies 10/11/2013  . IFG (impaired fasting glucose) 10/11/2013  . Hyperlipidemia 10/11/2013  . Chronic systolic heart failure ([place]) 05/26/2010  . Automatic implantable cardioverter-defibrillator in situ 05/26/2010    Consultants IR  Reason for Admission: The patient is a 50 year old male who presents with an abdominal mass.HPI: [name] is a 50 yo male who was referred for evaluation of a neuroendocrine tumor. He presented in November 2021 with jaundice and abdominal pain and a CT scan showed a 4cm mass adjacent to the head of the pancreas. He underwent ERCP on 11/16, and sludge was swept from the bile duct. A sphincterotomy was performed. LFTs subsequently normalized. He was discharged home and underwent an outpatient EUS on 06/25/20. This showed extrinsic compression of the third portion of the duodenum, with an adjacent 4cm mass that appeared to be mesenteric. FNA was performed and showed a neuroendocrine tumor. The patient was referred to a family emergency and prior clinic cancellation due to weather, he has not been able to see me until now. He was in the ED a few days ago with RUQ abdominal pain, and a RUQ US showed pericholecystic fluid concerning for cholecystitis. He had normal WBC and vitals and was discharged and sent to clinic  today. He is still having abdominal pain and says he has not been able to eat very much or go to work. Endorses chills.  The patient has a history of NICM with V-fib arrest, for which he had an ICD placed. He underwent cardiac evaluation last week for potential inpatient lap cholecystectomy, and was stratified as moderate risk for an abdominal procedure. He last had a remote device check of his ICD on 07/26/20, which showed normal function.  Procedures Percutaneous cholecystostomy drain placement  Hospital Course:  The patient was admitted and placed on Rocephin.  He underwent perc chole drain placement which he tolerated well.  He was stable for DC home after this procedure completed with 5 days total of augmentin.  He had a gout attack while here and was discharged on colchicine for 3 days.  He will follow up with his PCP as needed.  Allergies as of 08/27/2020      Reactions   Ace Inhibitors Cough      Medication List    STOP taking these medications   ibuprofen 800 MG tablet Commonly known as: ADVIL     TAKE these medications   acetaminophen 500 MG tablet Commonly known as: TYLENOL Take 2 tablets (1,000 mg total) by mouth every 6 (six) hours. What changed:   when to take this  reasons to take this   allopurinol 100 MG tablet Commonly known as: ZYLOPRIM Take one tablet by mouth daily for 1 week and then 2 tablets by mouth daily What changed:   how much to take  how to take this  when to take this  additional instructions   amiodarone 200 MG tablet Commonly known  as: PACERONE Take 1 tablet (200 mg total) by mouth daily. What changed: when to take this   amoxicillin-clavulanate 875-125 MG tablet Commonly known as: Augmentin Take 1 tablet by mouth 2 (two) times daily for 5 days. What changed: additional instructions   amoxicillin-clavulanate 875-125 MG tablet Commonly known as: Augmentin Take 1 tablet by mouth 2 (two) times daily for 3 days. What changed: You  were already taking a medication with the same name, and this prescription was added. Make sure you understand how and when to take each.   aspirin 81 MG tablet Take 1 tablet (81 mg total) by mouth 2 (two) times daily. What changed: when to take this   bisoprolol 10 MG tablet Commonly known as: ZEBETA Take 1 tablet by mouth once daily   budesonide-formoterol 80-4.5 MCG/ACT inhaler Commonly known as: SYMBICORT Inhale 2 puffs into the lungs 2 (two) times daily. Must keep upcoming office visit for refills What changed:   when to take this  reasons to take this  additional instructions   colchicine 0.6 MG tablet Take 1 tablet (0.6 mg total) by mouth 2 (two) times daily for 3 days.   Entresto 24-26 MG Generic drug: sacubitril-valsartan Take 1 tablet by mouth 2 (two) times daily.   oxyCODONE 5 MG immediate release tablet Commonly known as: Oxy IR/ROXICODONE Take 1-2 tablets (5-10 mg total) by mouth every 6 (six) hours as needed for severe pain.         Follow-up Information    [name], MD Follow up in 1 week(s).   Specialty: General Surgery Why: our office will call you with appointment date and time Contact information: [place]. [address] [place] [address] [phone]        Gildardo Pounds, NP Follow up.   Specialty: Nurse Practitioner Why: Follow up as needed for your gout.   Contact information: [address]  [place] [address] [phone]               Signed: Saverio Danker, [facility] Surgery 08/27/2020, 4:05 PM Please see Amion for pager number during day hours 7:00am-4:30pm, 7-11:30am on Weekends

## 2020-08-27 NOTE — Discharge Instructions (Signed)
Take Colchicine for 3 days for your gout.  If this does not improve then follow up with your primary care provider for further treatment.  We would like to avoid steroids right now in the setting of your acute infection as it can make your infection worse.  Cholecystostomy, Care After This sheet gives you information about how to care for yourself after your procedure. Your health care provider may also give you more specific instructions. If you have problems or questions, contact your health care provider. What can I expect after the procedure? After your procedure, it is common to have soreness near the incision site of your drainage tube (catheter). Follow these instructions at home: Incision care  Follow instructions from your health care provider about how to take care of your incision site where the catheter was inserted. Make sure you: ? Wash your hands with soap and water before and after you change your bandage (dressing). If soap and water are not available, use hand sanitizer. ? Change your dressing as told by your health care provider.  Check the incision site every day for signs of infection. Check for: ? Redness, swelling, or pain. ? Fluid or blood. ? Warmth. ? Pus or a bad smell.  Do not take baths, swim, or use a hot tub until your health care provider approves. Ask your health care provider if you may take showers. You may only be allowed to take sponge baths.   General instructions  Follow instructions from your health care provider about how to care for your catheter and collection bag at home.  Your health care provider will show you: ? How to record the amount of drainage from the catheter. ? How to flush the catheter. ? How to care for the catheter incision site.  Follow instructions from your health care provider about eating or drinking restrictions.  Take over-the-counter and prescription medicines only as told by your health care provider.  Keep all  follow-up visits as told by your health care provider. This is important. Contact a health care provider if:  You have redness, swelling, or pain around the catheter incision site.  You have nausea or vomiting. Get help right away if:  Your abdominal pain gets worse.  You feel dizzy or you faint while standing.  You have fluid or blood coming from the catheter incision site.  The area around the catheter incision site feels warm to the touch.  You have pus or a bad smell coming from the catheter incision site.  You have a fever.  You have shortness of breath.  You have a rapid heartbeat.  Your nausea or vomiting does not go away.  Your catheter becomes blocked.  Your catheter comes out of your abdomen. Summary  After your procedure, it is common to have soreness near the incision site of your drainage tube (catheter).  Wash your hands with soap and water before and after you change your bandage (dressing). Change your dressing as told by your health care provider.  Check the catheter incision site every day for signs of infection. Check for redness, swelling, pain, fluid, blood, warmth, pus, or a bad smell.  Contact your health care provider if you have nausea or vomiting, or if you have redness, swelling, or pain around your catheter incision site.  Get help right away if your abdominal pain gets worse, you feel dizzy, you have blood or fluid coming from the catheter incision site, you have a fever, or you have shortness  of breath. This information is not intended to replace advice given to you by your health care provider. Make sure you discuss any questions you have with your health care provider. Document Revised: 01/23/2018 Document Reviewed: 01/23/2018 Elsevier Patient Education  [place].

## 2020-08-27 NOTE — H&P (Signed)
       Subjective: Afebrile, vitals stable. Pain somewhat improved. WBC 12.   Objective: Vital signs in last 24 hours: Temp:  [97.7 F (36.5 C)-99 F (37.2 C)] 97.7 F (36.5 C) (02/16 0510) Pulse Rate:  [50-61] 50 (02/16 0510) Resp:  [18] 18 (02/16 0510) BP: (107-116)/(63-68) 107/68 (02/16 0510) SpO2:  [97 %-100 %] 99 % (02/16 0510) Weight:  [100.2 kg] 100.2 kg (02/15 2200)    Intake/Output from previous day: 02/15 0701 - 02/16 0700 In: 1618.5 [P.O.:240; I.V.:1378.5] Out: 545 [Urine:545] Intake/Output this shift: No intake/output data recorded.  PE: General: resting comfortably, NAD Neuro: alert and oriented, no focal deficits Resp: normal work of breathing, lungs CTAB CV: RRR Abdomen: soft, nondistended, focally tender to palpation in RUQ. Extremities: warm and well-perfused   Lab Results:  Recent Labs    08/26/20 1618  WBC 12.0*  HGB 15.2  HCT 44.2  PLT 451*   BMET Recent Labs    08/26/20 1618  NA 137  K 3.3*  CL 100  CO2 25  GLUCOSE 101*  BUN 10  CREATININE 1.21  CALCIUM 8.7*   PT/INR Recent Labs    08/26/20 1618  LABPROT 14.9  INR 1.2   CMP     Component Value Date/Time   NA 137 08/26/2020 1618   NA 142 07/21/2020 0921   K 3.3 (L) 08/26/2020 1618   CL 100 08/26/2020 1618   CO2 25 08/26/2020 1618   GLUCOSE 101 (H) 08/26/2020 1618   BUN 10 08/26/2020 1618   BUN 16 07/21/2020 0921   CREATININE 1.21 08/26/2020 1618   CREATININE 1.16 03/01/2016 1607   CALCIUM 8.7 (L) 08/26/2020 1618   PROT 6.9 08/26/2020 1618   PROT 6.7 07/21/2020 0921   ALBUMIN 2.4 (L) 08/26/2020 1618   ALBUMIN 4.1 07/21/2020 0921   AST 16 08/26/2020 1618   ALT 14 08/26/2020 1618   ALKPHOS 69 08/26/2020 1618   BILITOT 1.5 (H) 08/26/2020 1618   BILITOT 0.8 07/21/2020 0921   GFRNONAA >60 08/26/2020 1618   GFRNONAA >89 09/22/2015 1008   GFRAA 82 07/21/2020 0921   GFRAA >89 09/22/2015 1008   Lipase     Component Value Date/Time   LIPASE 23 08/22/2020 0318        Studies/Results: No results found.  Anti-infectives: Anti-infectives (From admission, onward)   Start     Dose/Rate Route Frequency Ordered Stop   08/26/20 1800  cefTRIAXone (ROCEPHIN) 2 g in sodium chloride 0.9 % 100 mL IVPB        2 g 200 mL/hr over 30 Minutes Intravenous Every 24 hours 08/26/20 1526         Assessment/Plan 50 yo male with neuroendocrine tumor adjacent to head of pancreas, presenting with acute cholecystitis. - IR consulted for percutaneous cholecystostomy. Patient will undergo cholecystectomy at time of Whipple for neuroendocrine tumor. - NPO for procedure, maintenance IV fluids - Rocephin for treatment of acute cholecystitis  - Diet following procedure - Dispo: admitted for observation   LOS: 0 days    Michaelle Birks, MD [facility] Surgery General, Hepatobiliary and Pancreatic Surgery 08/27/20 8:58 AM

## 2020-08-27 NOTE — Consult Note (Addendum)
Chief Complaint: Patient was seen in consultation today for acute cholecystitis/percutaneous cholecystectomy tube placement.  Referring Physician(s): Saverio Danker ([place])  Supervising Physician: Jacqulynn Cadet  Patient Status: [facility] - In-pt  History of Present Illness: [name] is a 50 y.o. male with a past medical history of hypertension, dyslipidemia, nonischemic cardiomyopathy, history of vfib s/p AICD, HD, nephrolithiasis, and neuroendocrine tumor of pancreas. He was recently admitted to [facility] 08/21/2020 to 08/22/2020 for management of acute cholecystitis. During this admission, CCS was consulted who recommended PO Augmentin and OP follow-up with hepatobiliary surgery regarding possible cholecystectomy and Whipple procedure (for acute cholecystitis and neuroendocrine tumor of pancreas). He was seen by Dr. Zenia Resides on OP basis 08/26/2020 who directly admitted patient back to [facility] same day for management of acute cholecystitis, as it is felt that preforming two abdominal procedures (cholecystectomy followed by Whipple procedure a few weeks later) is not feasible (per CCS to be done at same time).  IR consulted by Saverio Danker, PA-C for possible image-guided percutaneous cholecystostomy tube placement. Patient awake and alert sitting in chair with no complaints at this time. Denies fever, chills, chest pain, dyspnea, abdominal pain, N/V, or headache.   Past Medical History:  Diagnosis Date  . AICD (automatic cardioverter/defibrillator) present   . Dyslipidemia   . Heart failure with mildly reduced EF    EF 35-40 in 2011 // Echo 06/2019: EF 45-50, Gr 2 DD, RAE, mild MR, mild TR, mild PI, RVSP 33.6  . History of kidney stones   . HTN (hypertension)   . Hx of ventricular fibrillation    Hx of VFib arrest s/p AICD placement  . Nonischemic cardiomyopathy     Past Surgical History:  Procedure Laterality Date  . CARDIAC DEFIBRILLATOR PLACEMENT    . ERCP N/A 05/27/2020   Procedure:  ENDOSCOPIC RETROGRADE CHOLANGIOPANCREATOGRAPHY (ERCP);  Surgeon: Clarene Essex, MD;  Location: [place];  Service: Endoscopy;  Laterality: N/A;  . ESOPHAGOGASTRODUODENOSCOPY (EGD) WITH PROPOFOL N/A 06/25/2020   Procedure: ESOPHAGOGASTRODUODENOSCOPY (EGD) WITH PROPOFOL;  Surgeon: Arta Silence, MD;  Location: [facility] ENDOSCOPY;  Service: Endoscopy;  Laterality: N/A;  . FINE NEEDLE ASPIRATION N/A 06/25/2020   Procedure: FINE NEEDLE ASPIRATION (FNA) LINEAR;  Surgeon: Arta Silence, MD;  Location: [facility] ENDOSCOPY;  Service: Endoscopy;  Laterality: N/A;  . PACEMAKER IMPLANT    . REMOVAL OF STONES  05/27/2020   Procedure: REMOVAL OF STONES;  Surgeon: Clarene Essex, MD;  Location: [facility] ENDOSCOPY;  Service: Endoscopy;;  . repeat echo  2008    demonstrated near normalization  . SPHINCTEROTOMY  05/27/2020   Procedure: SPHINCTEROTOMY;  Surgeon: Clarene Essex, MD;  Location: [facility] ENDOSCOPY;  Service: Endoscopy;;  . TRANSTHORACIC ECHOCARDIOGRAM  11/09/2005, 01/15/2009, 02/07/2010  . UPPER ESOPHAGEAL ENDOSCOPIC ULTRASOUND (EUS) N/A 06/25/2020   Procedure: UPPER ESOPHAGEAL ENDOSCOPIC ULTRASOUND (EUS);  Surgeon: Arta Silence, MD;  Location: [facility] ENDOSCOPY;  Service: Endoscopy;  Laterality: N/A;    Allergies: Ace inhibitors  Medications: Prior to Admission medications   Medication Sig Start Date End Date Taking? Authorizing Provider  acetaminophen (TYLENOL) 500 MG tablet Take 2 tablets (1,000 mg total) by mouth every 6 (six) hours. Patient taking differently: Take 1,000 mg by mouth every 6 (six) hours as needed for mild pain. 08/22/20  Yes [name], MD  allopurinol (ZYLOPRIM) 100 MG tablet Take one tablet by mouth daily for 1 week and then 2 tablets by mouth daily Patient taking differently: Take 200 mg by mouth daily. 07/03/20  Yes Swords, Darrick Penna, MD  amiodarone ([place])  200 MG tablet Take 1 tablet (200 mg total) by mouth daily. Patient taking differently: Take 200 mg by mouth daily with supper. 07/30/20   Yes Evans Lance, MD  amoxicillin-clavulanate (AUGMENTIN) 875-125 MG tablet Take 1 tablet by mouth 2 (two) times daily for 5 days. Patient taking differently: Take 1 tablet by mouth 2 (two) times daily. Take for 5 days 08/22/20 08/27/20 Yes [name], MD  aspirin 81 MG tablet Take 1 tablet (81 mg total) by mouth 2 (two) times daily. Patient taking differently: Take 81 mg by mouth daily. 09/22/15  Yes Funches, Adriana Mccallum, MD  bisoprolol (ZEBETA) 10 MG tablet Take 1 tablet by mouth once daily 08/25/20  Yes Evans Lance, MD  budesonide-formoterol [facility]) 80-4.5 MCG/ACT inhaler Inhale 2 puffs into the lungs 2 (two) times daily. Must keep upcoming office visit for refills Patient taking differently: Inhale 2 puffs into the lungs 2 (two) times daily as needed (for flares). 11/09/19  Yes Fulp, Cammie, MD  ibuprofen (ADVIL) 800 MG tablet Take 1 tablet (800 mg total) by mouth every 8 (eight) hours as needed. 08/22/20  Yes Lovick, [name], MD  oxyCODONE (OXY IR/ROXICODONE) 5 MG immediate release tablet Take 1-2 tablets (5-10 mg total) by mouth every 6 (six) hours as needed for severe pain. 08/22/20  Yes Lovick, [name], MD  sacubitril-valsartan (ENTRESTO) 24-26 MG Take 1 tablet by mouth 2 (two) times daily. 06/25/20  Yes Evans Lance, MD     Family History  Problem Relation Age of Onset  . Diabetes Father   . Heart Problems Father        unsure of what kind  . Asthma Maternal Uncle   . Diabetes Maternal Grandmother     Social History   Socioeconomic History  . Marital status: Married    Spouse name: Not on file  . Number of children: Not on file  . Years of education: Not on file  . Highest education level: Not on file  Occupational History    Employer: [employer]  Tobacco Use  . Smoking status: Former Smoker    Packs/day: 0.25    Years: 2.00    Pack years: 0.50    Types: Cigarettes    Quit date: 07/12/1998    Years since quitting: 22.1  . Smokeless tobacco: Never Used  Vaping Use   . Vaping Use: Never used  Substance and Sexual Activity  . Alcohol use: No    Comment: Denies  . Drug use: No  . Sexual activity: Not on file  Other Topics Concern  . Not on file  Social History Narrative   The patient is married.  He is in native of [place],     living in [place] for 9 years.  He has a history of tobacco use,     but quit smoking a year ago.  Denies alcohol abuse.         Social Determinants of Health   Financial Resource Strain: Not on file  Food Insecurity: Not on file  Transportation Needs: Not on file  Physical Activity: Not on file  Stress: Not on file  Social Connections: Not on file     Review of Systems: A 12 point ROS discussed and pertinent positives are indicated in the HPI above.  All other systems are negative.  Review of Systems  Constitutional: Negative for chills and fever.  Respiratory: Negative for shortness of breath and wheezing.   Cardiovascular: Negative for chest pain and palpitations.  Gastrointestinal: Negative for abdominal pain, nausea and vomiting.  Neurological: Negative for headaches.  Psychiatric/Behavioral: Negative for behavioral problems and confusion.    Vital Signs: BP 107/68 (BP Location: Left Arm)   Pulse (!) 50   Temp 97.7 F (36.5 C)   Resp 18   Ht 6\' 2"  (1.88 m)   Wt 220 lb 14.4 oz (100.2 kg)   SpO2 99%   BMI 28.36 kg/m   Physical Exam Vitals and nursing note reviewed.  Constitutional:      General: He is not in acute distress.    Appearance: Normal appearance.  Cardiovascular:     Rate and Rhythm: Normal rate and regular rhythm.     Heart sounds: Normal heart sounds. No murmur heard.   Pulmonary:     Effort: Pulmonary effort is normal. No respiratory distress.     Breath sounds: Normal breath sounds. No wheezing.  Abdominal:     Palpations: Abdomen is soft.     Tenderness: There is no abdominal tenderness.     Comments: Negative Murphy's sign.  Skin:    General: Skin is warm and dry.   Neurological:     Mental Status: He is alert and oriented to person, place, and time.      MD Evaluation Airway: WNL Heart: WNL Abdomen: WNL Chest/ Lungs: WNL ASA  Classification: 3 Mallampati/Airway Score: Two   Imaging: CT Abdomen Pelvis W Contrast  Result Date: 08/21/2020 CLINICAL DATA:  Severe abdominal pain EXAM: CT ABDOMEN AND PELVIS WITH CONTRAST TECHNIQUE: Multidetector CT imaging of the abdomen and pelvis was performed using the standard protocol following bolus administration of intravenous contrast. CONTRAST:  145mL OMNIPAQUE IOHEXOL 300 MG/ML  SOLN COMPARISON:  05/24/2020 FINDINGS: Lower chest: Mild bibasilar atelectatic changes are noted. Hepatobiliary: Liver demonstrates mild fatty infiltration. Gallbladder is well distended with multiple gallstones within. Pancreas: Pancreas is well visualized and within normal limits with the exception on the centrally necrotic mass lesion adjacent to the head and uncinate process of the pancreas. This is stable in appearance from the prior exam. Spleen: Normal in size without focal abnormality. Adrenals/Urinary Tract: Adrenal glands are within normal limits. Kidneys demonstrate a normal enhancement pattern. No renal calculi or obstructive changes are noted. The bladder is partially distended. Stomach/Bowel: Appendix is within normal limits. No obstructive or inflammatory changes of the colon are seen. Small bowel and stomach appear within normal limits. Vascular/Lymphatic: No significant vascular findings are present. No enlarged abdominal or pelvic lymph nodes. Reproductive: Prostate is unremarkable. Other: No abdominal wall hernia or abnormality. No abdominopelvic ascites. Musculoskeletal: No acute or significant osseous findings. IMPRESSION: Stable mass lesion adjacent to the head and uncinate process of the pancreas. This is stable dating back to 2019 and has been recently biopsied. Cholelithiasis without complicating factors. Mild fatty  infiltration of the liver. Electronically Signed   By: Inez Catalina M.D.   On: 08/21/2020 14:42   US Abdomen Limited RUQ (LIVER/GB)  Result Date: 08/21/2020 CLINICAL DATA:  Abdominal pain.  Rule out cholecystitis. EXAM: ULTRASOUND ABDOMEN LIMITED RIGHT UPPER QUADRANT COMPARISON:  None. FINDINGS: Gallbladder: Gallstones noted within the gallbladder. There is wall thickening measuring up to 12 mm and pericholecystic fluid. Ring down artifact noted from the anterior gallbladder wall compatible with adenomyomatosis. The patient was not tender over the gallbladder during the study. Common bile duct: Diameter: Normal caliber, 3 mm Liver: No focal lesion identified. Within normal limits in parenchymal echogenicity. Portal vein is patent on color Doppler imaging with normal direction of  blood flow towards the liver. Other: None. IMPRESSION: Cholelithiasis. Changes of adenomyomatosis. Wall thickening and pericholecystic fluid without sonographic Murphy sign. Recommend clinical correlation to exclude acute cholecystitis. Electronically Signed   By: Rolm Baptise M.D.   On: 08/21/2020 19:23    Labs:  CBC: Recent Labs    05/28/20 0224 08/21/20 1037 08/22/20 0318 08/26/20 1618  WBC 5.3 7.2 10.4 12.0*  HGB 15.3 15.7 16.0 15.2  HCT 46.1 47.4 46.6 44.2  PLT 162 327 333 451*    COAGS: Recent Labs    05/25/20 0336 08/26/20 1618  INR 1.3* 1.2    BMP: Recent Labs    12/03/19 1703 05/24/20 1956 07/03/20 1643 07/21/20 0921 08/21/20 1037 08/22/20 0318 08/26/20 1618  NA 144   < > 142 142 138 139 137  K 4.6   < > 3.7 4.2 4.1 4.4 3.3*  CL 109*   < > 103 104 106 104 100  CO2 21   < > 25 27 22 26 25   GLUCOSE 103*   < > 105* 83 98 116* 101*  BUN 15   < > 18 16 11 10 10   CALCIUM 9.5   < > 9.1 9.5 8.7* 9.1 8.7*  CREATININE 1.27   < > 0.96 1.19 1.07 1.18 1.21  GFRNONAA 66   < > 92 71 >60 >60 >60  GFRAA 77  --  107 82  --   --   --    < > = values in this interval not displayed.    LIVER FUNCTION  TESTS: Recent Labs    07/21/20 0921 08/21/20 1037 08/22/20 0318 08/26/20 1618  BILITOT 0.8 0.9 0.9 1.5*  AST 15 29 18 16   ALT 22 31 28 14   ALKPHOS 65 64 55 69  PROT 6.7 6.7 6.2* 6.9  ALBUMIN 4.1 3.7 3.3* 2.4*     Assessment and Plan:  Acute cholecystitis with tentative plans for upcoming Whipple procedure for neuroendocrine tumor of pancreas (along with cholecystectomy) in next few weeks. Plan for image-guided percutaneous cholecystostomy tube placement in IR tentatively for today pending IR scheduling. Patient is NPO. Afebrile. He does not take blood thinners. INR 1.2 08/26/2020.  Risks and benefits were discussed with the patient including, but not limited to, bleeding, infection, gallbladder perforation, bile leak, sepsis or even death. All of the patient's questions were answered, patient is agreeable to proceed. Consent signed and in IR control room.   Thank you for this interesting consult.  I greatly enjoyed meeting Jaylen Knope and look forward to participating in their care.  A copy of this report was sent to the requesting provider on this date.  Electronically Signed: Earley Abide, PA-C 08/27/2020, 8:50 AM   I spent a total of 40 Minutes in face to face in clinical consultation, greater than 50% of which was counseling/coordinating care for acute cholecystitis/percutaneous cholecystectomy tube placement.

## 2020-09-01 LAB — AEROBIC/ANAEROBIC CULTURE W GRAM STAIN (SURGICAL/DEEP WOUND)

## 2020-09-08 MED FILL — AMIODARONE HCL 200 MG TAB: 200 | 30 days supply | Qty: 30 | Fill #0

## 2020-09-09 ENCOUNTER — Other Ambulatory Visit (HOSPITAL_COMMUNITY): Payer: Self-pay | Admitting: Internal Medicine

## 2020-09-09 DIAGNOSIS — R52 Pain, unspecified: Secondary | ICD-10-CM

## 2020-09-12 ENCOUNTER — Other Ambulatory Visit: Payer: Self-pay

## 2020-09-12 ENCOUNTER — Ambulatory Visit: Payer: Managed Care, Other (non HMO) | Admitting: Internal Medicine

## 2020-09-16 ENCOUNTER — Encounter (HOSPITAL_COMMUNITY): Payer: Self-pay

## 2020-09-16 ENCOUNTER — Encounter (HOSPITAL_COMMUNITY)
Admission: RE | Admit: 2020-09-16 | Discharge: 2020-09-16 | Disposition: A | Discharge status: Home / Self Care | Payer: Managed Care, Other (non HMO) | Source: Ambulatory Visit | Attending: Surgery | Admitting: Surgery

## 2020-09-16 ENCOUNTER — Other Ambulatory Visit: Payer: Self-pay

## 2020-09-16 ENCOUNTER — Encounter: Payer: Self-pay | Admitting: Internal Medicine

## 2020-09-16 DIAGNOSIS — Z01812 Encounter for preprocedural laboratory examination: Secondary | ICD-10-CM | POA: Diagnosis not present

## 2020-09-16 LAB — CBC
HCT: 44.4 % (ref 39.0–52.0)
Hemoglobin: 15.2 g/dL (ref 13.0–17.0)
MCH: 28.9 pg (ref 26.0–34.0)
MCHC: 34.2 g/dL (ref 30.0–36.0)
MCV: 84.4 fL (ref 80.0–100.0)
Platelets: 293 10*3/uL (ref 150–400)
RBC: 5.26 MIL/uL (ref 4.22–5.81)
RDW: 14.4 % (ref 11.5–15.5)
WBC: 5.8 10*3/uL (ref 4.0–10.5)
nRBC: 0 % (ref 0.0–0.2)

## 2020-09-16 LAB — BASIC METABOLIC PANEL
Anion gap: 10 (ref 5–15)
BUN: 11 mg/dL (ref 6–20)
CO2: 23 mmol/L (ref 22–32)
Calcium: 9.2 mg/dL (ref 8.9–10.3)
Chloride: 106 mmol/L (ref 98–111)
Creatinine, Ser: 1.17 mg/dL (ref 0.61–1.24)
GFR, Estimated: >60 mL/min (ref >60)
Glucose, Bld: 108 mg/dL — ABNORMAL HIGH (ref 70–99)
Potassium: 3.9 mmol/L (ref 3.5–5.1)
Sodium: 139 mmol/L (ref 135–145)

## 2020-09-16 NOTE — Progress Notes (Signed)
Device orders requested and received. [facility] representative called and notified.

## 2020-09-16 NOTE — Progress Notes (Signed)
PERIOPERATIVE PRESCRIPTION FOR IMPLANTED CARDIAC DEVICE PROGRAMMING  Patient Information: Name:  [name]  DOB:  February 25, 1971  MRN:  [id number]    Planned Procedure: WHIPPLE, CHOLECYSTECTOMY, POSSIBLE PARTIAL COLECTOMY  Surgeon: DR. Michaelle Birks  Date of Procedure: 09/26/2020  Cautery will be used.  Position during surgery:   Please send documentation back to:  [facility] (Fax # [phone])   Device Information:  Clinic EP Physician:  Cristopher Peru, MD   Device Type:  Defibrillator Manufacturer and Phone #:  St. Jude/Abbott: [phone] Pacemaker Dependent?:  No. Date of Last Device Check:  09/11/2020 Normal Device Function?:  Yes.    Electrophysiologist's Recommendations:   Have magnet available.  Provide continuous ECG monitoring when magnet is used or reprogramming is to be performed.   Procedure may interfere with device function.  Magnet should be placed over device during procedure.  Per Device Clinic Standing Orders, Simone Curia, RN  9:08 AM 09/16/2020

## 2020-09-16 NOTE — Progress Notes (Signed)
Anesthesia Chart Review:  Follows with cardiology for hx of non-ischemic CM, VF, s/p ICD insertion, initially for primary prevention who has had recurent VT and VF and been placed on amiodarone. Per cardiology note 04/29/20, he has not had any additional ventricular arrhythmias over the past 2 years. Most recent echo 05/2020 showed EF 45-50%, grade 3 dd He was previously cleared for surgery per telephone encounter 06/20/20, "Chart reviewed as part of pre-operative protocol coverage. Patient was last seen by Dr. Lovena Le in 04/2020. Patient was contacted today for further pre-op evaluation and reported doing well since last visit. He denied any chest pain, shortness of breath, palpitations, lightheadedness, dizziness, syncope, orthopnea, PND, edema. No ICD shocks. He is able to complete >4.0 METS without any problems. Given past medical history and time since last visit, based on ACC/AHA guidelines, Ettore Trebilcock would be at acceptable risk for the planned procedure without further cardiovascular testing. Patient takes Aspirin as primary preventions. He has no history of MI, PCI, or stroke. Therefore, OK to hold Aspirin if needed prior to procedure. Would restart this as soon as able following procedure."  Preop labs reviewed, unremarkable.  EKG 08/21/20: Sinus bradycardia. Rate 41. Short PR interval. Electronic ventricular pacemaker  EP periop device orders: Device Information:  Clinic EP Physician:  Cristopher Peru, MD   Device Type:  Defibrillator Manufacturer and Phone #:  St. Jude/Abbott: [phone] Pacemaker Dependent?:  No. Date of Last Device Check:  09/11/2020         Normal Device Function?:  Yes.    Electrophysiologist's Recommendations:   Have magnet available.  Provide continuous ECG monitoring when magnet is used or reprogramming is to be performed.   Procedure may interfere with device function.  Magnet should be placed over device during procedure.  TTE 05/25/20: 1. Left  ventricular ejection fraction, by estimation, is 45 to 50%. The  left ventricle has mildly decreased function. The left ventricle  demonstrates global hypokinesis. The left ventricular internal cavity size  was mildly dilated. There is mild  concentric left ventricular hypertrophy. Left ventricular diastolic  parameters are consistent with Grade III diastolic dysfunction  (restrictive). Elevated left ventricular end-diastolic pressure.  2. Right ventricular systolic function is normal. The right ventricular  size is normal. There is normal pulmonary artery systolic pressure. The  estimated right ventricular systolic pressure is 93.7 mmHg.  3. Left atrial size was moderately dilated.  4. The mitral valve is normal in structure. Trivial mitral valve  regurgitation. No evidence of mitral stenosis.  5. The aortic valve is normal in structure. Aortic valve regurgitation is  not visualized. Mild aortic valve sclerosis is present, with no evidence  of aortic valve stenosis.  6. The inferior vena cava is normal in size with greater than 50%  respiratory variability, suggesting right atrial pressure of 3 mmHg.  Cardiac cath 02/12/2010: Findings: LV was mildly enlarged.  Global hypokinesia.  EF approximately 25%.  Left main was patent.  LAD has minimal plaquing.  Diagonal 1 to diagonal 4 very, very small.  Ramus has 20 to 30% ostial stenosis.  Left circumflex has minimal plaquing.  OM1 and OM2 are very very small.  OM 3 was small which was patent.  RCA was small which was patent.  The patient has codominant coronary system.  The patient tolerated the procedure well.  There were no complications.  The patient was transferred to recovery room in stable condition.   Wynonia Musty [facility] Short Stay Center/Anesthesiology Phone [phone] 09/17/2020 10:13  AM

## 2020-09-16 NOTE — Progress Notes (Signed)
Your procedure is scheduled on Monday March 14th.  Report to [facility] Main Entrance "A" at 05:30 A.M., and check in at the Admitting office.  Call this number if you have problems the morning of surgery: [phone]  Call [phone] if you have any questions prior to your surgery date Monday-Friday 8am-4pm   Remember: Do not eat or drink after midnight the night before your surgery  Take these medicines the morning of surgery with A SIP OF WATER: allopurinol (ZYLOPRIM)  amiodarone (PACERONE)  bisoprolol (ZEBETA)  If needed: Tylenol Oxycodone Symbicort inhaler ----Please bring all inhalers with you the day of surgery.    Follow your surgeon's instructions on when to stop Aspirin.  If no instructions were given by your surgeon then you will need to call the office to get those instructions.    As of today, STOP taking any Aleve, Naproxen, Ibuprofen, Motrin, Advil, Goody's, BC's, all herbal medications, fish oil, and all vitamins.    The Morning of Surgery  Do not wear jewelry  Do not wear lotions, powders, colognes, or deodorant Men may shave face and neck.  Do not bring valuables to the hospital.  [facility] is not responsible for any belongings or valuables.  If you are a smoker, DO NOT Smoke 24 hours prior to surgery  If you wear a CPAP at night please bring your mask the morning of surgery   Remember that you must have someone to transport you home after your surgery, and remain with you for 24 hours if you are discharged the same day.   Please bring cases for contacts, glasses, hearing aids, dentures or bridgework because it cannot be worn into surgery.    Leave your suitcase in the car.  After surgery it may be brought to your room.  For patients admitted to the hospital, discharge time will be determined by your treatment team.  Patients discharged the day of surgery will not be allowed to drive home.    Special instructions:   [facility]- Preparing  For Surgery  Before surgery, you can play an important role. Because skin is not sterile, your skin needs to be as free of germs as possible. You can reduce the number of germs on your skin by washing with CHG (chlorahexidine gluconate) Soap before surgery.  CHG is an antiseptic cleaner which kills germs and bonds with the skin to continue killing germs even after washing.    Oral Hygiene is also important to reduce your risk of infection.  Remember - BRUSH YOUR TEETH THE MORNING OF SURGERY WITH YOUR REGULAR TOOTHPASTE  Please do not use if you have an allergy to CHG or antibacterial soaps. If your skin becomes reddened/irritated stop using the CHG.  Do not shave (including legs and underarms) for at least 48 hours prior to first CHG shower. It is OK to shave your face.  Please follow these instructions carefully.   1. Shower the NIGHT BEFORE SURGERY and the MORNING OF SURGERY with CHG Soap.   2. If you chose to wash your hair and body, wash as usual with your normal shampoo and body-wash/soap.  3. Rinse your hair and body thoroughly to remove the shampoo and soap.  4. Apply CHG directly to the skin (ONLY FROM THE NECK DOWN) and wash gently with a scrungie or a clean washcloth.   5. Do not use on open wounds or open sores. Avoid contact with your eyes, ears, mouth and genitals (private parts). Wash Face and  genitals (private parts)  with your normal soap.   6. Wash thoroughly, paying special attention to the area where your surgery will be performed.  7. Thoroughly rinse your body with warm water from the neck down.  8. DO NOT shower/wash with your normal soap after using and rinsing off the CHG Soap.  9. Pat yourself dry with a CLEAN TOWEL.  10. Wear CLEAN PAJAMAS to bed the night before surgery  11. Place CLEAN SHEETS on your bed the night of your first shower and DO NOT SLEEP WITH PETS.  12. Wear comfortable clothes the morning of surgery.     Day of Surgery:  Please shower  the morning of surgery with the CHG soap Do not apply any deodorants/lotions. Please wear clean clothes to the hospital/surgery center.   Remember to brush your teeth WITH YOUR REGULAR TOOTHPASTE.   Please read over the following fact sheets that you were given.

## 2020-09-16 NOTE — Progress Notes (Signed)
PCP - Geryl Rankins, NP Cardiologist - Cristopher Peru, MD  PPM/ICD - AICD Device Orders - requested and received Rep Notified - yes  Chest x-ray -  EKG - 08/21/20 Stress Test -  ECHO - 05/25/20 Cardiac Cath - denies  Blood Thinner Instructions:  Aspirin Instructions: Follow your surgeon's instructions on when to stop Aspirin.  If no instructions were given by your surgeon then you will need to call the office to get those instructions.    COVID TEST- 09/19/20   Anesthesia review: yes cardiac hx, AICD   Coronavirus Screening  Have you experienced the following symptoms:  Cough yes/no: No Fever (>100.31F)  yes/no: No Runny nose yes/no: No Sore throat yes/no: No Difficulty breathing/shortness of breath  yes/no: No  Have you or a family member traveled in the last 14 days and where? yes/no: No   If the patient indicates "YES" to the above questions, their PAT will be rescheduled to limit the exposure to others and, the surgeon will be notified. THE PATIENT WILL NEED TO BE ASYMPTOMATIC FOR 14 DAYS.   If the patient is not experiencing any of these symptoms, the PAT nurse will instruct them to NOT bring anyone with them to their appointment since they may have these symptoms or traveled as well.   Please remind your patients and families that hospital visitation restrictions are in effect and the importance of the restrictions.    Patient denies shortness of breath, fever, cough and chest pain at PAT appointment   All instructions explained to the patient, with a verbal understanding of the material. Patient agrees to go over the instructions while at home for a better understanding. Patient also instructed to self quarantine after being tested for COVID-19. The opportunity to ask questions was provided.

## 2020-09-19 ENCOUNTER — Other Ambulatory Visit (HOSPITAL_COMMUNITY)
Admission: RE | Admit: 2020-09-19 | Discharge: 2020-09-19 | Disposition: A | Discharge status: Home / Self Care | Payer: Managed Care, Other (non HMO) | Source: Ambulatory Visit | Attending: Surgery | Admitting: Surgery

## 2020-09-19 DIAGNOSIS — Z01812 Encounter for preprocedural laboratory examination: Secondary | ICD-10-CM | POA: Insufficient documentation

## 2020-09-19 DIAGNOSIS — Z20822 Contact with and (suspected) exposure to covid-19: Secondary | ICD-10-CM | POA: Insufficient documentation

## 2020-09-19 LAB — SARS CORONAVIRUS 2 (TAT 6-24 HRS): SARS Coronavirus 2: NEGATIVE

## 2020-09-21 NOTE — Anesthesia Preprocedure Evaluation (Addendum)
Anesthesia Evaluation  Patient identified by MRN, date of birth, ID band Patient awake    Reviewed: Allergy & Precautions, NPO status , Patient's Chart, lab work & pertinent test results  Airway Mallampati: II  TM Distance: >3 FB Neck ROM: Full    Dental  (+) Dental Advisory Given   Pulmonary asthma , former smoker,    Pulmonary exam normal breath sounds clear to auscultation       Cardiovascular hypertension, Pt. on medications and Pt. on home beta blockers Normal cardiovascular exam+ Cardiac Defibrillator  Rhythm:Regular Rate:Normal  Echo 05/25/2020 1. Left ventricular ejection fraction, by estimation, is 45 to 50%. The left ventricle has mildly decreased function. The left ventricle demonstrates global hypokinesis. The left ventricular internal cavity size was mildly dilated. There is mild concentric left ventricular hypertrophy. Left ventricular diastolic parameters are consistent with Grade III diastolic dysfunction (restrictive). Elevated left ventricular end-diastolic pressure.  2. Right ventricular systolic function is normal. The right ventricular size is normal. There is normal pulmonary artery systolic pressure. The estimated right ventricular systolic pressure is 67.3 mmHg.  3. Left atrial size was moderately dilated.  4. The mitral valve is normal in structure. Trivial mitral valve regurgitation. No evidence of mitral stenosis.  5. The aortic valve is normal in structure. Aortic valve regurgitation is not visualized. Mild aortic valve sclerosis is present, with no evidence of aortic valve stenosis.  6. The inferior vena cava is normal in size with greater than 50% respiratory variability, suggesting right atrial pressure of 3 mmHg.    Neuro/Psych negative neurological ROS     GI/Hepatic negative GI ROS, Neg liver ROS,   Endo/Other  negative endocrine ROS  Renal/GU Renal disease     Musculoskeletal negative  musculoskeletal ROS (+)   Abdominal (+) - obese,   Peds  Hematology negative hematology ROS (+)   Anesthesia Other Findings   Reproductive/Obstetrics                            Anesthesia Physical  Anesthesia Plan  ASA: III  Anesthesia Plan: General and Epidural   Post-op Pain Management: GA combined w/ Regional for post-op pain   Induction: Intravenous  PONV Risk Score and Plan: 4 or greater and Ondansetron, Dexamethasone, Treatment may vary due to age or medical condition, Midazolam, Diphenhydramine and Aprepitant  Airway Management Planned: Oral ETT  Additional Equipment: Arterial line  Intra-op Plan:   Post-operative Plan: Extubation in OR  Informed Consent: I have reviewed the patients History and Physical, chart, labs and discussed the procedure including the risks, benefits and alternatives for the proposed anesthesia with the patient or authorized representative who has indicated his/her understanding and acceptance.     Dental advisory given  Plan Discussed with: CRNA  Anesthesia Plan Comments: (2 x PIV)       Anesthesia Quick Evaluation

## 2020-09-22 ENCOUNTER — Encounter (HOSPITAL_COMMUNITY): Payer: Self-pay | Admitting: Surgery

## 2020-09-22 ENCOUNTER — Inpatient Hospital Stay (HOSPITAL_COMMUNITY)
Admission: RE | Admit: 2020-09-22 | Discharge: 2020-12-10 | DRG: 003 | Disposition: E | Payer: Managed Care, Other (non HMO) | Attending: Surgery | Admitting: Surgery

## 2020-09-22 ENCOUNTER — Inpatient Hospital Stay (HOSPITAL_COMMUNITY): Payer: Managed Care, Other (non HMO) | Admitting: Physician Assistant

## 2020-09-22 ENCOUNTER — Other Ambulatory Visit: Payer: Self-pay

## 2020-09-22 ENCOUNTER — Inpatient Hospital Stay (HOSPITAL_COMMUNITY): Payer: Managed Care, Other (non HMO)

## 2020-09-22 ENCOUNTER — Encounter (HOSPITAL_COMMUNITY): Admission: RE | Disposition: E | Payer: Self-pay | Source: Home / Self Care | Attending: Surgery

## 2020-09-22 DIAGNOSIS — T17908A Unspecified foreign body in respiratory tract, part unspecified causing other injury, initial encounter: Secondary | ICD-10-CM | POA: Diagnosis not present

## 2020-09-22 DIAGNOSIS — R0902 Hypoxemia: Secondary | ICD-10-CM

## 2020-09-22 DIAGNOSIS — R6521 Severe sepsis with septic shock: Secondary | ICD-10-CM | POA: Diagnosis present

## 2020-09-22 DIAGNOSIS — Z66 Do not resuscitate: Secondary | ICD-10-CM | POA: Diagnosis not present

## 2020-09-22 DIAGNOSIS — E870 Hyperosmolality and hypernatremia: Secondary | ICD-10-CM | POA: Diagnosis not present

## 2020-09-22 DIAGNOSIS — R5381 Other malaise: Secondary | ICD-10-CM | POA: Diagnosis not present

## 2020-09-22 DIAGNOSIS — Z930 Tracheostomy status: Secondary | ICD-10-CM

## 2020-09-22 DIAGNOSIS — E872 Acidosis: Secondary | ICD-10-CM | POA: Diagnosis not present

## 2020-09-22 DIAGNOSIS — Z7982 Long term (current) use of aspirin: Secondary | ICD-10-CM

## 2020-09-22 DIAGNOSIS — J9600 Acute respiratory failure, unspecified whether with hypoxia or hypercapnia: Secondary | ICD-10-CM | POA: Diagnosis not present

## 2020-09-22 DIAGNOSIS — R578 Other shock: Secondary | ICD-10-CM | POA: Diagnosis not present

## 2020-09-22 DIAGNOSIS — I4891 Unspecified atrial fibrillation: Secondary | ICD-10-CM | POA: Diagnosis not present

## 2020-09-22 DIAGNOSIS — J690 Pneumonitis due to inhalation of food and vomit: Secondary | ICD-10-CM | POA: Diagnosis not present

## 2020-09-22 DIAGNOSIS — T8119XA Other postprocedural shock, initial encounter: Secondary | ICD-10-CM | POA: Diagnosis not present

## 2020-09-22 DIAGNOSIS — Z9289 Personal history of other medical treatment: Secondary | ICD-10-CM

## 2020-09-22 DIAGNOSIS — R0600 Dyspnea, unspecified: Secondary | ICD-10-CM

## 2020-09-22 DIAGNOSIS — D638 Anemia in other chronic diseases classified elsewhere: Secondary | ICD-10-CM | POA: Diagnosis not present

## 2020-09-22 DIAGNOSIS — Z95810 Presence of automatic (implantable) cardiac defibrillator: Secondary | ICD-10-CM

## 2020-09-22 DIAGNOSIS — S35339A Unspecified injury of superior mesenteric vein, initial encounter: Secondary | ICD-10-CM

## 2020-09-22 DIAGNOSIS — Z515 Encounter for palliative care: Secondary | ICD-10-CM | POA: Diagnosis not present

## 2020-09-22 DIAGNOSIS — C251 Malignant neoplasm of body of pancreas: Secondary | ICD-10-CM | POA: Diagnosis not present

## 2020-09-22 DIAGNOSIS — J9601 Acute respiratory failure with hypoxia: Secondary | ICD-10-CM | POA: Diagnosis not present

## 2020-09-22 DIAGNOSIS — G9340 Encephalopathy, unspecified: Secondary | ICD-10-CM | POA: Diagnosis not present

## 2020-09-22 DIAGNOSIS — J9621 Acute and chronic respiratory failure with hypoxia: Secondary | ICD-10-CM | POA: Diagnosis not present

## 2020-09-22 DIAGNOSIS — E43 Unspecified severe protein-calorie malnutrition: Secondary | ICD-10-CM | POA: Diagnosis not present

## 2020-09-22 DIAGNOSIS — I5022 Chronic systolic (congestive) heart failure: Secondary | ICD-10-CM | POA: Diagnosis present

## 2020-09-22 DIAGNOSIS — R509 Fever, unspecified: Secondary | ICD-10-CM

## 2020-09-22 DIAGNOSIS — Z888 Allergy status to other drugs, medicaments and biological substances status: Secondary | ICD-10-CM

## 2020-09-22 DIAGNOSIS — R0602 Shortness of breath: Secondary | ICD-10-CM

## 2020-09-22 DIAGNOSIS — K8000 Calculus of gallbladder with acute cholecystitis without obstruction: Secondary | ICD-10-CM | POA: Diagnosis present

## 2020-09-22 DIAGNOSIS — L0291 Cutaneous abscess, unspecified: Secondary | ICD-10-CM | POA: Diagnosis not present

## 2020-09-22 DIAGNOSIS — S35331A Laceration of superior mesenteric vein, initial encounter: Secondary | ICD-10-CM | POA: Diagnosis not present

## 2020-09-22 DIAGNOSIS — E874 Mixed disorder of acid-base balance: Secondary | ICD-10-CM | POA: Diagnosis not present

## 2020-09-22 DIAGNOSIS — Z833 Family history of diabetes mellitus: Secondary | ICD-10-CM

## 2020-09-22 DIAGNOSIS — Z8674 Personal history of sudden cardiac arrest: Secondary | ICD-10-CM

## 2020-09-22 DIAGNOSIS — C259 Malignant neoplasm of pancreas, unspecified: Secondary | ICD-10-CM | POA: Diagnosis present

## 2020-09-22 DIAGNOSIS — Z978 Presence of other specified devices: Secondary | ICD-10-CM | POA: Diagnosis not present

## 2020-09-22 DIAGNOSIS — R579 Shock, unspecified: Secondary | ICD-10-CM | POA: Diagnosis not present

## 2020-09-22 DIAGNOSIS — Z538 Procedure and treatment not carried out for other reasons: Secondary | ICD-10-CM | POA: Diagnosis not present

## 2020-09-22 DIAGNOSIS — A419 Sepsis, unspecified organism: Secondary | ICD-10-CM | POA: Diagnosis present

## 2020-09-22 DIAGNOSIS — Z789 Other specified health status: Secondary | ICD-10-CM | POA: Diagnosis not present

## 2020-09-22 DIAGNOSIS — I480 Paroxysmal atrial fibrillation: Secondary | ICD-10-CM | POA: Diagnosis not present

## 2020-09-22 DIAGNOSIS — J150 Pneumonia due to Klebsiella pneumoniae: Secondary | ICD-10-CM | POA: Diagnosis not present

## 2020-09-22 DIAGNOSIS — Z7189 Other specified counseling: Secondary | ICD-10-CM | POA: Diagnosis not present

## 2020-09-22 DIAGNOSIS — I959 Hypotension, unspecified: Secondary | ICD-10-CM | POA: Diagnosis not present

## 2020-09-22 DIAGNOSIS — K91870 Postprocedural hematoma of a digestive system organ or structure following a digestive system procedure: Secondary | ICD-10-CM | POA: Diagnosis not present

## 2020-09-22 DIAGNOSIS — Z1611 Resistance to penicillins: Secondary | ICD-10-CM | POA: Diagnosis present

## 2020-09-22 DIAGNOSIS — Z87442 Personal history of urinary calculi: Secondary | ICD-10-CM

## 2020-09-22 DIAGNOSIS — Z781 Physical restraint status: Secondary | ICD-10-CM

## 2020-09-22 DIAGNOSIS — I420 Dilated cardiomyopathy: Secondary | ICD-10-CM | POA: Diagnosis not present

## 2020-09-22 DIAGNOSIS — I428 Other cardiomyopathies: Secondary | ICD-10-CM | POA: Diagnosis present

## 2020-09-22 DIAGNOSIS — E876 Hypokalemia: Secondary | ICD-10-CM | POA: Diagnosis present

## 2020-09-22 DIAGNOSIS — E8770 Fluid overload, unspecified: Secondary | ICD-10-CM | POA: Diagnosis not present

## 2020-09-22 DIAGNOSIS — J9690 Respiratory failure, unspecified, unspecified whether with hypoxia or hypercapnia: Secondary | ICD-10-CM

## 2020-09-22 DIAGNOSIS — Z9911 Dependence on respirator [ventilator] status: Secondary | ICD-10-CM | POA: Diagnosis not present

## 2020-09-22 DIAGNOSIS — Z7951 Long term (current) use of inhaled steroids: Secondary | ICD-10-CM

## 2020-09-22 DIAGNOSIS — N179 Acute kidney failure, unspecified: Secondary | ICD-10-CM

## 2020-09-22 DIAGNOSIS — I493 Ventricular premature depolarization: Secondary | ICD-10-CM | POA: Diagnosis not present

## 2020-09-22 DIAGNOSIS — Z825 Family history of asthma and other chronic lower respiratory diseases: Secondary | ICD-10-CM

## 2020-09-22 DIAGNOSIS — K661 Hemoperitoneum: Secondary | ICD-10-CM | POA: Diagnosis not present

## 2020-09-22 DIAGNOSIS — L8990 Pressure ulcer of unspecified site, unspecified stage: Secondary | ICD-10-CM | POA: Insufficient documentation

## 2020-09-22 DIAGNOSIS — Z9049 Acquired absence of other specified parts of digestive tract: Secondary | ICD-10-CM | POA: Diagnosis not present

## 2020-09-22 DIAGNOSIS — K559 Vascular disorder of intestine, unspecified: Secondary | ICD-10-CM

## 2020-09-22 DIAGNOSIS — E785 Hyperlipidemia, unspecified: Secondary | ICD-10-CM | POA: Diagnosis present

## 2020-09-22 DIAGNOSIS — R571 Hypovolemic shock: Secondary | ICD-10-CM | POA: Diagnosis not present

## 2020-09-22 DIAGNOSIS — R739 Hyperglycemia, unspecified: Secondary | ICD-10-CM | POA: Diagnosis not present

## 2020-09-22 DIAGNOSIS — D65 Disseminated intravascular coagulation [defibrination syndrome]: Secondary | ICD-10-CM | POA: Diagnosis not present

## 2020-09-22 DIAGNOSIS — D689 Coagulation defect, unspecified: Secondary | ICD-10-CM | POA: Diagnosis not present

## 2020-09-22 DIAGNOSIS — K912 Postsurgical malabsorption, not elsewhere classified: Secondary | ICD-10-CM | POA: Diagnosis not present

## 2020-09-22 DIAGNOSIS — I471 Supraventricular tachycardia: Secondary | ICD-10-CM | POA: Diagnosis not present

## 2020-09-22 DIAGNOSIS — C7B8 Other secondary neuroendocrine tumors: Secondary | ICD-10-CM | POA: Diagnosis present

## 2020-09-22 DIAGNOSIS — D3A8 Other benign neuroendocrine tumors: Principal | ICD-10-CM | POA: Diagnosis present

## 2020-09-22 DIAGNOSIS — E878 Other disorders of electrolyte and fluid balance, not elsewhere classified: Secondary | ICD-10-CM | POA: Diagnosis not present

## 2020-09-22 DIAGNOSIS — B49 Unspecified mycosis: Secondary | ICD-10-CM | POA: Diagnosis not present

## 2020-09-22 DIAGNOSIS — E781 Pure hyperglyceridemia: Secondary | ICD-10-CM | POA: Diagnosis present

## 2020-09-22 DIAGNOSIS — Z87891 Personal history of nicotine dependence: Secondary | ICD-10-CM

## 2020-09-22 DIAGNOSIS — Z20822 Contact with and (suspected) exposure to covid-19: Secondary | ICD-10-CM | POA: Diagnosis present

## 2020-09-22 DIAGNOSIS — K55029 Acute infarction of small intestine, extent unspecified: Secondary | ICD-10-CM | POA: Diagnosis not present

## 2020-09-22 DIAGNOSIS — Z452 Encounter for adjustment and management of vascular access device: Secondary | ICD-10-CM

## 2020-09-22 DIAGNOSIS — Z8249 Family history of ischemic heart disease and other diseases of the circulatory system: Secondary | ICD-10-CM

## 2020-09-22 DIAGNOSIS — R4182 Altered mental status, unspecified: Secondary | ICD-10-CM | POA: Diagnosis not present

## 2020-09-22 DIAGNOSIS — Z79899 Other long term (current) drug therapy: Secondary | ICD-10-CM

## 2020-09-22 DIAGNOSIS — N5089 Other specified disorders of the male genital organs: Secondary | ICD-10-CM | POA: Diagnosis not present

## 2020-09-22 DIAGNOSIS — Z01818 Encounter for other preprocedural examination: Secondary | ICD-10-CM

## 2020-09-22 DIAGNOSIS — I472 Ventricular tachycardia: Secondary | ICD-10-CM | POA: Diagnosis not present

## 2020-09-22 DIAGNOSIS — Z430 Encounter for attention to tracheostomy: Secondary | ICD-10-CM

## 2020-09-22 DIAGNOSIS — E875 Hyperkalemia: Secondary | ICD-10-CM | POA: Diagnosis not present

## 2020-09-22 DIAGNOSIS — E8809 Other disorders of plasma-protein metabolism, not elsewhere classified: Secondary | ICD-10-CM | POA: Diagnosis not present

## 2020-09-22 DIAGNOSIS — R61 Generalized hyperhidrosis: Secondary | ICD-10-CM | POA: Diagnosis not present

## 2020-09-22 DIAGNOSIS — I110 Hypertensive heart disease with heart failure: Secondary | ICD-10-CM | POA: Diagnosis present

## 2020-09-22 DIAGNOSIS — J189 Pneumonia, unspecified organism: Secondary | ICD-10-CM | POA: Diagnosis not present

## 2020-09-22 DIAGNOSIS — I10 Essential (primary) hypertension: Secondary | ICD-10-CM | POA: Diagnosis present

## 2020-09-22 DIAGNOSIS — R601 Generalized edema: Secondary | ICD-10-CM | POA: Diagnosis not present

## 2020-09-22 HISTORY — PX: WHIPPLE PROCEDURE: SHX2667

## 2020-09-22 HISTORY — PX: PATCH ANGIOPLASTY: SHX6230

## 2020-09-22 HISTORY — PX: COLECTOMY: SHX59

## 2020-09-22 HISTORY — PX: CHOLECYSTECTOMY: SHX55

## 2020-09-22 HISTORY — PX: COLOSTOMY: SHX63

## 2020-09-22 LAB — CBC
HCT: 36.5 % — ABNORMAL LOW (ref 39.0–52.0)
HCT: 30.8 % — ABNORMAL LOW (ref 39.0–52.0)
Hemoglobin: 12.2 g/dL — ABNORMAL LOW (ref 13.0–17.0)
Hemoglobin: 10.6 g/dL — ABNORMAL LOW (ref 13.0–17.0)
MCH: 29.2 pg (ref 26.0–34.0)
MCH: 29.8 pg (ref 26.0–34.0)
MCHC: 33.4 g/dL (ref 30.0–36.0)
MCHC: 34.4 g/dL (ref 30.0–36.0)
MCV: 87.3 fL (ref 80.0–100.0)
MCV: 86.5 fL (ref 80.0–100.0)
Platelets: 398 10*3/uL (ref 150–400)
Platelets: 423 10*3/uL — ABNORMAL HIGH (ref 150–400)
RBC: 4.18 MIL/uL — ABNORMAL LOW (ref 4.22–5.81)
RBC: 3.56 MIL/uL — ABNORMAL LOW (ref 4.22–5.81)
RDW: 14.4 % (ref 11.5–15.5)
RDW: 14.5 % (ref 11.5–15.5)
WBC: 19.5 10*3/uL — ABNORMAL HIGH (ref 4.0–10.5)
WBC: 20.4 10*3/uL — ABNORMAL HIGH (ref 4.0–10.5)
nRBC: 0 % (ref 0.0–0.2)
nRBC: 0 % (ref 0.0–0.2)

## 2020-09-22 LAB — POCT I-STAT 7, (LYTES, BLD GAS, ICA,H+H)
Acid-Base Excess: 0 mmol/L (ref 0.0–2.0)
Acid-base deficit: 2 mmol/L (ref 0.0–2.0)
Acid-base deficit: 5 mmol/L — ABNORMAL HIGH (ref 0.0–2.0)
Acid-base deficit: 5 mmol/L — ABNORMAL HIGH (ref 0.0–2.0)
Acid-base deficit: 6 mmol/L — ABNORMAL HIGH (ref 0.0–2.0)
Acid-base deficit: 19 mmol/L — ABNORMAL HIGH (ref 0.0–2.0)
Bicarbonate: 25.4 mmol/L (ref 20.0–28.0)
Bicarbonate: 23.8 mmol/L (ref 20.0–28.0)
Bicarbonate: 21.8 mmol/L (ref 20.0–28.0)
Bicarbonate: 20.7 mmol/L (ref 20.0–28.0)
Bicarbonate: 22 mmol/L (ref 20.0–28.0)
Bicarbonate: 9.1 mmol/L — ABNORMAL LOW (ref 20.0–28.0)
Calcium, Ion: 1.2 mmol/L (ref 1.15–1.40)
Calcium, Ion: 1.2 mmol/L (ref 1.15–1.40)
Calcium, Ion: 1.12 mmol/L — ABNORMAL LOW (ref 1.15–1.40)
Calcium, Ion: 1.14 mmol/L — ABNORMAL LOW (ref 1.15–1.40)
Calcium, Ion: 1.16 mmol/L (ref 1.15–1.40)
Calcium, Ion: 1.04 mmol/L — ABNORMAL LOW (ref 1.15–1.40)
HCT: 41 % (ref 39.0–52.0)
HCT: 39 % (ref 39.0–52.0)
HCT: 38 % — ABNORMAL LOW (ref 39.0–52.0)
HCT: 32 % — ABNORMAL LOW (ref 39.0–52.0)
HCT: 35 % — ABNORMAL LOW (ref 39.0–52.0)
HCT: 17 % — ABNORMAL LOW (ref 39.0–52.0)
Hemoglobin: 13.9 g/dL (ref 13.0–17.0)
Hemoglobin: 13.3 g/dL (ref 13.0–17.0)
Hemoglobin: 12.9 g/dL — ABNORMAL LOW (ref 13.0–17.0)
Hemoglobin: 10.9 g/dL — ABNORMAL LOW (ref 13.0–17.0)
Hemoglobin: 11.9 g/dL — ABNORMAL LOW (ref 13.0–17.0)
Hemoglobin: 5.8 g/dL — CL (ref 13.0–17.0)
O2 Saturation: 100 %
O2 Saturation: 100 %
O2 Saturation: 100 %
O2 Saturation: 100 %
O2 Saturation: 100 %
O2 Saturation: 99 %
Patient temperature: 97.6
Potassium: 3.5 mmol/L (ref 3.5–5.1)
Potassium: 4.2 mmol/L (ref 3.5–5.1)
Potassium: 4.3 mmol/L (ref 3.5–5.1)
Potassium: 4.2 mmol/L (ref 3.5–5.1)
Potassium: 4.3 mmol/L (ref 3.5–5.1)
Potassium: 5 mmol/L (ref 3.5–5.1)
Sodium: 142 mmol/L (ref 135–145)
Sodium: 141 mmol/L (ref 135–145)
Sodium: 142 mmol/L (ref 135–145)
Sodium: 142 mmol/L (ref 135–145)
Sodium: 143 mmol/L (ref 135–145)
Sodium: 140 mmol/L (ref 135–145)
TCO2: 27 mmol/L (ref 22–32)
TCO2: 25 mmol/L (ref 22–32)
TCO2: 23 mmol/L (ref 22–32)
TCO2: 22 mmol/L (ref 22–32)
TCO2: 24 mmol/L (ref 22–32)
TCO2: 10 mmol/L — ABNORMAL LOW (ref 22–32)
pCO2 arterial: 44.1 mmHg (ref 32.0–48.0)
pCO2 arterial: 46.1 mmHg (ref 32.0–48.0)
pCO2 arterial: 48.1 mmHg — ABNORMAL HIGH (ref 32.0–48.0)
pCO2 arterial: 38.2 mmHg (ref 32.0–48.0)
pCO2 arterial: 53.5 mmHg — ABNORMAL HIGH (ref 32.0–48.0)
pCO2 arterial: 27.4 mmHg — ABNORMAL LOW (ref 32.0–48.0)
pH, Arterial: 7.368 (ref 7.350–7.450)
pH, Arterial: 7.322 — ABNORMAL LOW (ref 7.350–7.450)
pH, Arterial: 7.264 — ABNORMAL LOW (ref 7.350–7.450)
pH, Arterial: 7.221 — ABNORMAL LOW (ref 7.350–7.450)
pH, Arterial: 7.341 — ABNORMAL LOW (ref 7.350–7.450)
pH, Arterial: 7.127 — CL (ref 7.350–7.450)
pO2, Arterial: 518 mmHg — ABNORMAL HIGH (ref 83.0–108.0)
pO2, Arterial: 274 mmHg — ABNORMAL HIGH (ref 83.0–108.0)
pO2, Arterial: 280 mmHg — ABNORMAL HIGH (ref 83.0–108.0)
pO2, Arterial: 255 mmHg — ABNORMAL HIGH (ref 83.0–108.0)
pO2, Arterial: 257 mmHg — ABNORMAL HIGH (ref 83.0–108.0)
pO2, Arterial: 155 mmHg — ABNORMAL HIGH (ref 83.0–108.0)

## 2020-09-22 LAB — COMPREHENSIVE METABOLIC PANEL
ALT: 16 U/L (ref 0–44)
AST: 29 U/L (ref 15–41)
Albumin: 2.9 g/dL — ABNORMAL LOW (ref 3.5–5.0)
Alkaline Phosphatase: 34 U/L — ABNORMAL LOW (ref 38–126)
Anion gap: 10 (ref 5–15)
BUN: 15 mg/dL (ref 6–20)
CO2: 18 mmol/L — ABNORMAL LOW (ref 22–32)
Calcium: 7.6 mg/dL — ABNORMAL LOW (ref 8.9–10.3)
Chloride: 111 mmol/L (ref 98–111)
Creatinine, Ser: 1.28 mg/dL — ABNORMAL HIGH (ref 0.61–1.24)
GFR, Estimated: >60 mL/min (ref >60)
Glucose, Bld: 228 mg/dL — ABNORMAL HIGH (ref 70–99)
Potassium: 4.6 mmol/L (ref 3.5–5.1)
Sodium: 139 mmol/L (ref 135–145)
Total Bilirubin: 1.5 mg/dL — ABNORMAL HIGH (ref 0.3–1.2)
Total Protein: 4.7 g/dL — ABNORMAL LOW (ref 6.5–8.1)

## 2020-09-22 LAB — DIC (DISSEMINATED INTRAVASCULAR COAGULATION)PANEL
D-Dimer, Quant: 2.71 ug/mL-FEU — ABNORMAL HIGH (ref 0.00–0.50)
Fibrinogen: 203 mg/dL — ABNORMAL LOW (ref 210–475)
INR: 1.8 — ABNORMAL HIGH (ref 0.8–1.2)
Platelets: 390 10*3/uL (ref 150–400)
Prothrombin Time: 20.3 seconds — ABNORMAL HIGH (ref 11.4–15.2)
Smear Review: NONE SEEN
aPTT: 33 seconds (ref 24–36)

## 2020-09-22 LAB — PREPARE RBC (CROSSMATCH)

## 2020-09-22 LAB — PROTIME-INR
INR: 1.4 — ABNORMAL HIGH (ref 0.8–1.2)
INR: 1.4 — ABNORMAL HIGH (ref 0.8–1.2)
Prothrombin Time: 16.5 seconds — ABNORMAL HIGH (ref 11.4–15.2)
Prothrombin Time: 16.9 seconds — ABNORMAL HIGH (ref 11.4–15.2)

## 2020-09-22 LAB — GLUCOSE, CAPILLARY: Glucose-Capillary: 194 mg/dL — ABNORMAL HIGH (ref 70–99)

## 2020-09-22 LAB — MRSA PCR SCREENING: MRSA by PCR: NEGATIVE

## 2020-09-22 SURGERY — WHIPPLE PROCEDURE
Anesthesia: Epidural | Site: Abdomen

## 2020-09-22 MED ORDER — LACTATED RINGERS IV SOLN: INTRAVENOUS | Status: DC

## 2020-09-22 MED ORDER — PHENYLEPHRINE HCL-NACL 10-0.9 MG/250ML-% IV SOLN
25.0000 ug/min | INTRAVENOUS | Status: DC
  Administered 2020-09-22: 150 ug/min via INTRAVENOUS
  Filled 2020-09-22: qty 250

## 2020-09-22 MED ORDER — AMIODARONE HCL 200 MG PO TABS: 200.0000 mg | ORAL_TABLET | Freq: Every day | ORAL | Status: DC

## 2020-09-22 MED ORDER — SODIUM CHLORIDE 0.9 % IV SOLN
250.0000 mL | INTRAVENOUS | Status: DC
  Administered 2020-10-06 – 2020-11-23 (×11): 250 mL via INTRAVENOUS

## 2020-09-22 MED ORDER — MEPERIDINE HCL 25 MG/ML IJ SOLN: 6.2500 mg | INTRAMUSCULAR | Status: DC | PRN

## 2020-09-22 MED ORDER — PHENYLEPHRINE 40 MCG/ML (10ML) SYRINGE FOR IV PUSH (FOR BLOOD PRESSURE SUPPORT)
PREFILLED_SYRINGE | INTRAVENOUS | Status: AC
  Filled 2020-09-22: qty 10

## 2020-09-22 MED ORDER — HYDROMORPHONE 1 MG/ML IV SOLN
INTRAVENOUS | Status: DC
Start: 2020-09-22 — End: 2020-09-23
  Administered 2020-09-23: 0.8 mg via INTRAVENOUS
  Administered 2020-09-23: 1.4 mg via INTRAVENOUS
  Filled 2020-09-22: qty 30

## 2020-09-22 MED ORDER — DEXAMETHASONE SODIUM PHOSPHATE 10 MG/ML IJ SOLN
INTRAMUSCULAR | Status: DC | PRN
  Administered 2020-09-22: 10 mg via INTRAVENOUS

## 2020-09-22 MED ORDER — PHENYLEPHRINE HCL-NACL 10-0.9 MG/250ML-% IV SOLN
INTRAVENOUS | Status: DC | PRN
  Administered 2020-09-22: 45 ug/min via INTRAVENOUS
  Administered 2020-09-22: 175 ug/min via INTRAVENOUS
  Administered 2020-09-22: 50 ug/min via INTRAVENOUS
  Administered 2020-09-22: 150 ug/min via INTRAVENOUS

## 2020-09-22 MED ORDER — SODIUM CHLORIDE 0.9 % IV SOLN
150.0000 mg | Freq: Once | INTRAVENOUS | Status: AC
  Administered 2020-09-22: 150 mg via INTRAVENOUS
  Filled 2020-09-22: qty 5

## 2020-09-22 MED ORDER — DEXAMETHASONE SODIUM PHOSPHATE 10 MG/ML IJ SOLN
INTRAMUSCULAR | Status: AC
  Filled 2020-09-22: qty 1

## 2020-09-22 MED ORDER — PHENYLEPHRINE CONCENTRATED 100MG/250ML (0.4 MG/ML) INFUSION SIMPLE
0.0000 ug/min | INTRAVENOUS | Status: DC
  Administered 2020-09-22 – 2020-09-23 (×3): 400 ug/min via INTRAVENOUS
  Filled 2020-09-22 (×4): qty 250

## 2020-09-22 MED ORDER — ROCURONIUM BROMIDE 10 MG/ML (PF) SYRINGE
PREFILLED_SYRINGE | INTRAVENOUS | Status: AC
  Filled 2020-09-22: qty 10

## 2020-09-22 MED ORDER — FLUTICASONE FUROATE-VILANTEROL 100-25 MCG/INH IN AEPB
1.0000 | INHALATION_SPRAY | Freq: Every day | RESPIRATORY_TRACT | Status: DC
  Filled 2020-09-22: qty 28

## 2020-09-22 MED ORDER — LIDOCAINE 2% (20 MG/ML) 5 ML SYRINGE
INTRAMUSCULAR | Status: DC | PRN
  Administered 2020-09-22: 100 mg via INTRAVENOUS

## 2020-09-22 MED ORDER — DIPHENHYDRAMINE HCL 50 MG/ML IJ SOLN: 12.5000 mg | Freq: Four times a day (QID) | INTRAMUSCULAR | Status: DC | PRN

## 2020-09-22 MED ORDER — ALBUMIN HUMAN 5 % IV SOLN: INTRAVENOUS | Status: DC | PRN

## 2020-09-22 MED ORDER — 0.9 % SODIUM CHLORIDE (POUR BTL) OPTIME
TOPICAL | Status: DC | PRN
  Administered 2020-09-22 (×2): 1000 mL

## 2020-09-22 MED ORDER — GLYCOPYRROLATE PF 0.2 MG/ML IJ SOSY
PREFILLED_SYRINGE | INTRAMUSCULAR | Status: AC
  Filled 2020-09-22: qty 1

## 2020-09-22 MED ORDER — SODIUM CHLORIDE 0.9% IV SOLUTION: Freq: Once | INTRAVENOUS | Status: AC

## 2020-09-22 MED ORDER — ALBUMIN HUMAN 5 % IV SOLN
INTRAVENOUS | Status: AC
  Administered 2020-09-22: 25 g via INTRAVENOUS
  Filled 2020-09-22: qty 500

## 2020-09-22 MED ORDER — CALCIUM GLUCONATE-NACL 2-0.675 GM/100ML-% IV SOLN
2.0000 g | Freq: Once | INTRAVENOUS | Status: AC
  Administered 2020-09-23: 2000 mg via INTRAVENOUS
  Filled 2020-09-22: qty 100

## 2020-09-22 MED ORDER — SODIUM CHLORIDE 0.9 % IV SOLN
INTRAVENOUS | Status: AC
  Filled 2020-09-22: qty 1.2

## 2020-09-22 MED ORDER — NOREPINEPHRINE 4 MG/250ML-% IV SOLN
2.0000 ug/min | INTRAVENOUS | Status: DC
  Filled 2020-09-22: qty 250

## 2020-09-22 MED ORDER — NOREPINEPHRINE 4 MG/250ML-% IV SOLN
2.0000 ug/min | INTRAVENOUS | Status: DC
  Administered 2020-09-22: 4 ug/min via INTRAVENOUS
  Filled 2020-09-22: qty 250

## 2020-09-22 MED ORDER — EPHEDRINE 5 MG/ML INJ
INTRAVENOUS | Status: AC
  Filled 2020-09-22: qty 10

## 2020-09-22 MED ORDER — SODIUM CHLORIDE 0.9% IV SOLUTION: Freq: Once | INTRAVENOUS | Status: DC

## 2020-09-22 MED ORDER — PHENYLEPHRINE HCL-NACL 10-0.9 MG/250ML-% IV SOLN
0.0000 ug/min | INTRAVENOUS | Status: DC
  Filled 2020-09-22 (×2): qty 250

## 2020-09-22 MED ORDER — ALBUMIN HUMAN 5 % IV SOLN: 25.0000 g | Freq: Once | INTRAVENOUS | Status: AC

## 2020-09-22 MED ORDER — CEFAZOLIN SODIUM-DEXTROSE 2-4 GM/100ML-% IV SOLN
2.0000 g | INTRAVENOUS | Status: AC
  Administered 2020-09-22 (×2): 2 g via INTRAVENOUS
  Filled 2020-09-22: qty 100

## 2020-09-22 MED ORDER — BISOPROLOL FUMARATE 10 MG PO TABS
10.0000 mg | ORAL_TABLET | Freq: Once | ORAL | Status: DC
  Filled 2020-09-22: qty 1

## 2020-09-22 MED ORDER — SODIUM CHLORIDE 0.9 % IV SOLN
INTRAVENOUS | Status: DC | PRN
  Administered 2020-09-22: 500 mL

## 2020-09-22 MED ORDER — FENTANYL CITRATE (PF) 250 MCG/5ML IJ SOLN
INTRAMUSCULAR | Status: AC
  Filled 2020-09-22: qty 5

## 2020-09-22 MED ORDER — SODIUM BICARBONATE 8.4 % IV SOLN
INTRAVENOUS | Status: AC
  Administered 2020-09-22: 100 meq via INTRAVENOUS
  Filled 2020-09-22: qty 100

## 2020-09-22 MED ORDER — OXYMETAZOLINE HCL 0.05 % NA SOLN
NASAL | Status: DC | PRN
  Administered 2020-09-22: 2 via NASAL

## 2020-09-22 MED ORDER — PROPOFOL 10 MG/ML IV BOLUS
INTRAVENOUS | Status: AC
  Filled 2020-09-22: qty 40

## 2020-09-22 MED ORDER — NOREPINEPHRINE 16 MG/250ML-% IV SOLN
0.0000 ug/min | INTRAVENOUS | Status: DC
  Administered 2020-09-22 – 2020-09-23 (×2): 40 ug/min via INTRAVENOUS
  Filled 2020-09-22 (×4): qty 250

## 2020-09-22 MED ORDER — EPHEDRINE SULFATE 50 MG/ML IJ SOLN
INTRAMUSCULAR | Status: DC | PRN
  Administered 2020-09-22: 10 mg via INTRAVENOUS
  Administered 2020-09-22: 5 mg via INTRAVENOUS
  Administered 2020-09-22 (×3): 10 mg via INTRAVENOUS
  Administered 2020-09-22: 5 mg via INTRAVENOUS
  Administered 2020-09-22: 10 mg via INTRAVENOUS
  Administered 2020-09-22: 5 mg via INTRAVENOUS
  Administered 2020-09-22: 10 mg via INTRAVENOUS

## 2020-09-22 MED ORDER — LACTATED RINGERS IV SOLN: INTRAVENOUS | Status: DC | PRN

## 2020-09-22 MED ORDER — SODIUM CHLORIDE 0.9 % IV SOLN
250.0000 mL | INTRAVENOUS | Status: DC
  Administered 2020-10-03: 250 mL via INTRAVENOUS

## 2020-09-22 MED ORDER — AMISULPRIDE (ANTIEMETIC) 5 MG/2ML IV SOLN: 10.0000 mg | Freq: Once | INTRAVENOUS | Status: DC | PRN

## 2020-09-22 MED ORDER — KETAMINE HCL 50 MG/5ML IJ SOSY
PREFILLED_SYRINGE | INTRAMUSCULAR | Status: AC
  Filled 2020-09-22: qty 5

## 2020-09-22 MED ORDER — FENTANYL CITRATE (PF) 100 MCG/2ML IJ SOLN
INTRAMUSCULAR | Status: DC | PRN
  Administered 2020-09-22: 100 ug via INTRAVENOUS

## 2020-09-22 MED ORDER — ALBUMIN HUMAN 5 % IV SOLN
INTRAVENOUS | Status: AC
  Filled 2020-09-22: qty 250

## 2020-09-22 MED ORDER — METRONIDAZOLE IN NACL 5-0.79 MG/ML-% IV SOLN
500.0000 mg | INTRAVENOUS | Status: AC
  Administered 2020-09-22: 500 mg via INTRAVENOUS
  Filled 2020-09-22: qty 100

## 2020-09-22 MED ORDER — HYDROMORPHONE HCL 1 MG/ML IJ SOLN
0.2500 mg | INTRAMUSCULAR | Status: DC | PRN
Start: 2020-09-22 — End: 2020-09-22

## 2020-09-22 MED ORDER — KETAMINE HCL 10 MG/ML IJ SOLN
INTRAMUSCULAR | Status: DC | PRN
  Administered 2020-09-22: 20 mg via INTRAVENOUS

## 2020-09-22 MED ORDER — HEMOSTATIC AGENTS (NO CHARGE) OPTIME
TOPICAL | Status: DC | PRN
  Administered 2020-09-22 (×6): 1

## 2020-09-22 MED ORDER — ROCURONIUM BROMIDE 10 MG/ML (PF) SYRINGE
PREFILLED_SYRINGE | INTRAVENOUS | Status: DC | PRN
  Administered 2020-09-22: 20 mg via INTRAVENOUS
  Administered 2020-09-22: 50 mg via INTRAVENOUS
  Administered 2020-09-22: 20 mg via INTRAVENOUS
  Administered 2020-09-22: 40 mg via INTRAVENOUS
  Administered 2020-09-22: 20 mg via INTRAVENOUS
  Administered 2020-09-22: 50 mg via INTRAVENOUS
  Administered 2020-09-22: 60 mg via INTRAVENOUS

## 2020-09-22 MED ORDER — ORAL CARE MOUTH RINSE: 15.0000 mL | Freq: Once | OROMUCOSAL | Status: AC

## 2020-09-22 MED ORDER — VASOPRESSIN 20 UNITS/100 ML INFUSION FOR SHOCK
0.0000 [IU]/min | INTRAVENOUS | Status: DC
  Administered 2020-09-22 – 2020-09-26 (×5): 0.03 [IU]/min via INTRAVENOUS
  Administered 2020-09-28: 0.01 [IU]/min via INTRAVENOUS
  Administered 2020-09-29: 0.03 [IU]/min via INTRAVENOUS
  Administered 2020-09-30 (×2): 0.02 [IU]/min via INTRAVENOUS
  Filled 2020-09-22 (×9): qty 100

## 2020-09-22 MED ORDER — SODIUM BICARBONATE 8.4 % IV SOLN: 100.0000 meq | Freq: Once | INTRAVENOUS | Status: AC

## 2020-09-22 MED ORDER — CHLORHEXIDINE GLUCONATE 0.12 % MT SOLN
15.0000 mL | Freq: Once | OROMUCOSAL | Status: AC
  Administered 2020-09-22: 15 mL via OROMUCOSAL
  Filled 2020-09-22: qty 15

## 2020-09-22 MED ORDER — GLYCOPYRROLATE 0.2 MG/ML IJ SOLN
INTRAMUSCULAR | Status: DC | PRN
  Administered 2020-09-22: .2 mg via INTRAVENOUS

## 2020-09-22 MED ORDER — SUGAMMADEX SODIUM 200 MG/2ML IV SOLN
INTRAVENOUS | Status: DC | PRN
  Administered 2020-09-22: 200 mg via INTRAVENOUS

## 2020-09-22 MED ORDER — INSULIN ASPART 100 UNIT/ML ~~LOC~~ SOLN
0.0000 [IU] | SUBCUTANEOUS | Status: DC
  Administered 2020-09-22: 3 [IU] via SUBCUTANEOUS
  Administered 2020-09-23: 11 [IU] via SUBCUTANEOUS
  Administered 2020-09-23: 3 [IU] via SUBCUTANEOUS
  Administered 2020-09-23: 15 [IU] via SUBCUTANEOUS
  Administered 2020-09-25 – 2020-09-27 (×11): 2 [IU] via SUBCUTANEOUS
  Administered 2020-09-28 – 2020-09-29 (×6): 3 [IU] via SUBCUTANEOUS
  Administered 2020-09-29: 2 [IU] via SUBCUTANEOUS
  Administered 2020-09-29 (×2): 3 [IU] via SUBCUTANEOUS
  Administered 2020-09-29: 2 [IU] via SUBCUTANEOUS
  Administered 2020-09-30 (×2): 3 [IU] via SUBCUTANEOUS
  Administered 2020-09-30 (×4): 5 [IU] via SUBCUTANEOUS
  Administered 2020-10-01: 3 [IU] via SUBCUTANEOUS
  Administered 2020-10-01 (×2): 5 [IU] via SUBCUTANEOUS
  Administered 2020-10-01 (×2): 3 [IU] via SUBCUTANEOUS
  Administered 2020-10-01 – 2020-10-02 (×4): 5 [IU] via SUBCUTANEOUS

## 2020-09-22 MED ORDER — ROPIVACAINE HCL 2 MG/ML IJ SOLN
INTRAMUSCULAR | Status: DC | PRN
  Administered 2020-09-22: 6 mL/h via EPIDURAL

## 2020-09-22 MED ORDER — ONDANSETRON HCL 4 MG/2ML IJ SOLN
4.0000 mg | Freq: Four times a day (QID) | INTRAMUSCULAR | Status: DC | PRN
  Administered 2020-09-24 – 2020-11-24 (×64): 4 mg via INTRAVENOUS
  Filled 2020-09-22 (×66): qty 2

## 2020-09-22 MED ORDER — SODIUM CHLORIDE 0.9 % IV BOLUS
500.0000 mL | Freq: Once | INTRAVENOUS | Status: AC
  Administered 2020-09-22: 500 mL via INTRAVENOUS

## 2020-09-22 MED ORDER — SODIUM CHLORIDE 0.9 % IV SOLN: INTRAVENOUS | Status: DC | PRN

## 2020-09-22 MED ORDER — SODIUM CHLORIDE 0.9% FLUSH: 9.0000 mL | INTRAVENOUS | Status: DC | PRN

## 2020-09-22 MED ORDER — SODIUM CHLORIDE 0.9% IV SOLUTION
Freq: Once | INTRAVENOUS | Status: AC
  Administered 2020-09-23: 50 mL/h via INTRAVENOUS

## 2020-09-22 MED ORDER — PANTOPRAZOLE SODIUM 40 MG IV SOLR
40.0000 mg | INTRAVENOUS | Status: DC
  Administered 2020-09-22 – 2020-09-23 (×2): 40 mg via INTRAVENOUS
  Filled 2020-09-22 (×2): qty 40

## 2020-09-22 MED ORDER — CHLORHEXIDINE GLUCONATE CLOTH 2 % EX PADS
6.0000 | MEDICATED_PAD | Freq: Every day | CUTANEOUS | Status: DC
  Administered 2020-09-22 – 2020-10-26 (×33): 6 via TOPICAL

## 2020-09-22 MED ORDER — MIDAZOLAM HCL 5 MG/5ML IJ SOLN
INTRAMUSCULAR | Status: DC | PRN
  Administered 2020-09-22: 2 mg via INTRAVENOUS

## 2020-09-22 MED ORDER — LIDOCAINE 2% (20 MG/ML) 5 ML SYRINGE
INTRAMUSCULAR | Status: AC
  Filled 2020-09-22: qty 5

## 2020-09-22 MED ORDER — CEFAZOLIN SODIUM 1 G IJ SOLR
INTRAMUSCULAR | Status: AC
  Filled 2020-09-22: qty 20

## 2020-09-22 MED ORDER — PHENYLEPHRINE HCL (PRESSORS) 10 MG/ML IV SOLN
INTRAVENOUS | Status: DC | PRN
  Administered 2020-09-22 (×2): 40 ug via INTRAVENOUS

## 2020-09-22 MED ORDER — ONDANSETRON HCL 4 MG/2ML IJ SOLN
INTRAMUSCULAR | Status: DC | PRN
  Administered 2020-09-22: 4 mg via INTRAVENOUS

## 2020-09-22 MED ORDER — PROPOFOL 10 MG/ML IV BOLUS
INTRAVENOUS | Status: DC | PRN
  Administered 2020-09-22: 150 mg via INTRAVENOUS

## 2020-09-22 MED ORDER — NOREPINEPHRINE 4 MG/250ML-% IV SOLN
0.0000 ug/min | INTRAVENOUS | Status: DC
  Administered 2020-09-22: 40 ug/min via INTRAVENOUS

## 2020-09-22 MED ORDER — ONDANSETRON HCL 4 MG/2ML IJ SOLN
INTRAMUSCULAR | Status: AC
  Filled 2020-09-22: qty 2

## 2020-09-22 MED ORDER — MIDAZOLAM HCL 2 MG/2ML IJ SOLN
INTRAMUSCULAR | Status: AC
  Filled 2020-09-22: qty 2

## 2020-09-22 MED ORDER — NALOXONE HCL 0.4 MG/ML IJ SOLN: 0.4000 mg | INTRAMUSCULAR | Status: DC | PRN

## 2020-09-22 MED ORDER — OXYMETAZOLINE HCL 0.05 % NA SOLN
NASAL | Status: AC
  Filled 2020-09-22: qty 30

## 2020-09-22 SURGICAL SUPPLY — 131 items
ADH SKN CLS APL DERMABOND .7 (GAUZE/BANDAGES/DRESSINGS)
APL PRP STRL LF DISP 70% ISPRP (MISCELLANEOUS) ×2
BAG DRAINAGE 600ML DEPOT (BAG) ×1 IMPLANT
BAG DRN 24 TWST VLV ADJ (BAG) ×2
BIOPATCH RED 1 DISK 7.0 (GAUZE/BANDAGES/DRESSINGS) ×2 IMPLANT
BLADE CLIPPER SURG (BLADE) IMPLANT
BOOT SUTURE AID YELLOW STND (SUTURE) ×6 IMPLANT
BRR ADH 5X3 SEPRAFILM 6 SHT (MISCELLANEOUS)
BRR ADH 6X5 SEPRAFILM 1 SHT (MISCELLANEOUS) ×4
CANISTER SUCT 3000ML PPV (MISCELLANEOUS) ×3 IMPLANT
CATH ROBINSON RED A/P 16FR (CATHETERS) IMPLANT
CHLORAPREP W/TINT 26 (MISCELLANEOUS) ×3 IMPLANT
CLIP VESOCCLUDE LG 6/CT (CLIP) ×3 IMPLANT
CLIP VESOCCLUDE MED 24/CT (CLIP) ×3 IMPLANT
CLIP VESOCCLUDE SM WIDE 24/CT (CLIP) IMPLANT
CNTNR URN SCR LID CUP LEK RST (MISCELLANEOUS) IMPLANT
CONT SPEC 4OZ STRL OR WHT (MISCELLANEOUS)
COUNTER NEEDLE 20 DBL MAG RED (NEEDLE) IMPLANT
COVER MAYO STAND STRL (DRAPES) ×4 IMPLANT
COVER SURGICAL LIGHT HANDLE (MISCELLANEOUS) ×3 IMPLANT
COVER WAND RF STERILE (DRAPES) ×2 IMPLANT
DERMABOND ADVANCED (GAUZE/BANDAGES/DRESSINGS)
DERMABOND ADVANCED .7 DNX12 (GAUZE/BANDAGES/DRESSINGS) ×2 IMPLANT
DRAIN CHANNEL 19F RND (DRAIN) ×4 IMPLANT
DRAIN PENROSE 0.5X18 (DRAIN) IMPLANT
DRAPE HALF SHEET 40X57 (DRAPES) ×2 IMPLANT
DRAPE INCISE IOBAN 66X45 STRL (DRAPES) ×3 IMPLANT
DRAPE LAPAROSCOPIC ABDOMINAL (DRAPES) ×3 IMPLANT
DRAPE WARM FLUID 44X44 (DRAPES) ×3 IMPLANT
DRSG COVADERM 4X10 (GAUZE/BANDAGES/DRESSINGS) IMPLANT
DRSG COVADERM 4X14 (GAUZE/BANDAGES/DRESSINGS) IMPLANT
DRSG COVADERM 4X6 (GAUZE/BANDAGES/DRESSINGS) IMPLANT
DRSG COVADERM 4X8 (GAUZE/BANDAGES/DRESSINGS) IMPLANT
DRSG COVADERM PLUS 2X2 (GAUZE/BANDAGES/DRESSINGS) IMPLANT
DRSG OPSITE POSTOP 4X12 (GAUZE/BANDAGES/DRESSINGS) ×1 IMPLANT
DRSG TEGADERM 4X4.75 (GAUZE/BANDAGES/DRESSINGS) ×2 IMPLANT
DRSG TELFA 3X8 NADH (GAUZE/BANDAGES/DRESSINGS) IMPLANT
ELECT BLADE 4.0 EZ CLEAN MEGAD (MISCELLANEOUS) ×3
ELECT BLADE 6.5 EXT (BLADE) ×3 IMPLANT
ELECT CAUTERY BLADE 6.4 (BLADE) ×3 IMPLANT
ELECT PAD DSPR THERM+ ADLT (MISCELLANEOUS) ×3 IMPLANT
ELECT REM PT RETURN 9FT ADLT (ELECTROSURGICAL) ×3
ELECTRODE BLDE 4.0 EZ CLN MEGD (MISCELLANEOUS) ×2 IMPLANT
ELECTRODE REM PT RTRN 9FT ADLT (ELECTROSURGICAL) ×2 IMPLANT
EVACUATOR SILICONE 100CC (DRAIN) ×4 IMPLANT
GAUZE 4X4 16PLY RFD (DISPOSABLE) ×3 IMPLANT
GAUZE SPONGE 4X4 12PLY STRL (GAUZE/BANDAGES/DRESSINGS) IMPLANT
GEL ULTRASOUND 20GR AQUASONIC (MISCELLANEOUS) ×1 IMPLANT
GLOVE BIOGEL PI IND STRL 6 (GLOVE) ×4 IMPLANT
GLOVE BIOGEL PI INDICATOR 6 (GLOVE) ×2
GLOVE SURG SYN 5.5 (GLOVE) ×6 IMPLANT
GLOVE SURG SYN 5.5 PF PI (GLOVE) ×4 IMPLANT
GOWN STRL REUS W/ TWL LRG LVL3 (GOWN DISPOSABLE) ×10 IMPLANT
GOWN STRL REUS W/TWL LRG LVL3 (GOWN DISPOSABLE) ×15
HAND PENCIL TRP OPTION (MISCELLANEOUS) ×3 IMPLANT
HANDLE SUCTION POOLE (INSTRUMENTS) ×2 IMPLANT
HEMOSTAT SNOW SURGICEL 2X4 (HEMOSTASIS) ×3 IMPLANT
HEMOSTAT SURGICEL 2X14 (HEMOSTASIS) IMPLANT
HEMOSTAT SURGICEL 2X4 FIBR (HEMOSTASIS) ×1 IMPLANT
KIT BASIN OR (CUSTOM PROCEDURE TRAY) ×3 IMPLANT
KIT OSTOMY DRAINABLE 2.75 STR (WOUND CARE) ×1 IMPLANT
KIT TUBE JEJUNAL 16FR (CATHETERS) IMPLANT
KIT TURNOVER KIT B (KITS) ×3 IMPLANT
LOOP VESSEL MAXI BLUE (MISCELLANEOUS) ×3 IMPLANT
LOOP VESSEL MINI RED (MISCELLANEOUS) ×3 IMPLANT
MARKER SKIN DUAL TIP RULER LAB (MISCELLANEOUS) ×3 IMPLANT
NS IRRIG 1000ML POUR BTL (IV SOLUTION) ×6 IMPLANT
PACK GENERAL/GYN (CUSTOM PROCEDURE TRAY) ×1 IMPLANT
PAD ARMBOARD 7.5X6 YLW CONV (MISCELLANEOUS) ×6 IMPLANT
PAD DRESSING TELFA 3X8 NADH (GAUZE/BANDAGES/DRESSINGS) IMPLANT
PATCH VASC XENOSURE 1CMX6CM (Vascular Products) ×3 IMPLANT
PATCH VASC XENOSURE 1X6 (Vascular Products) IMPLANT
PENCIL SMOKE EVACUATOR (MISCELLANEOUS) ×3 IMPLANT
PLUG CATH AND CAP STER (CATHETERS) IMPLANT
RELOAD PROXIMATE 75MM BLUE (ENDOMECHANICALS) ×9 IMPLANT
RELOAD PROXIMATE 75MM GREEN (ENDOMECHANICALS) IMPLANT
RELOAD STAPLE 75 3.8 BLU REG (ENDOMECHANICALS) ×4 IMPLANT
RELOAD STAPLE 75 4.5 GRN THCK (ENDOMECHANICALS) IMPLANT
RETRACTOR WND ALEXIS 25 LRG (MISCELLANEOUS) IMPLANT
RETRACTOR WOUND ALXS 34CM XLRG (MISCELLANEOUS) IMPLANT
RTRCTR WOUND ALEXIS 25CM LRG (MISCELLANEOUS)
RTRCTR WOUND ALEXIS 34CM XLRG (MISCELLANEOUS) ×3
SEPRAFILM MEMBRANE 5X6 (MISCELLANEOUS) ×2 IMPLANT
SEPRAFILM PROCEDURAL PACK 3X5 (MISCELLANEOUS) IMPLANT
SHEARS FOC LG CVD HARMONIC 17C (MISCELLANEOUS) ×3 IMPLANT
SLEEVE SUCTION 125 (MISCELLANEOUS) IMPLANT
SLEEVE SUCTION CATH 165 (SLEEVE) IMPLANT
SPONGE INTESTINAL PEANUT (DISPOSABLE) IMPLANT
SPONGE LAP 18X18 RF (DISPOSABLE) ×12 IMPLANT
SPONGE SURGIFOAM ABS GEL 100 (HEMOSTASIS) IMPLANT
STAPLER PROXIMATE 75MM BLUE (STAPLE) ×3 IMPLANT
STAPLER VISISTAT 35W (STAPLE) ×2 IMPLANT
SUCTION POOLE HANDLE (INSTRUMENTS)
SUT 5.0 PDS RB-1 (SUTURE)
SUT CHROMIC 4 0 RB 1X27 (SUTURE) ×1 IMPLANT
SUT ETHILON 2 0 FS 18 (SUTURE) ×5 IMPLANT
SUT ETHILON 2 LR (SUTURE) IMPLANT
SUT ETHILON 3 0 FSL (SUTURE) ×1 IMPLANT
SUT MNCRL AB 4-0 PS2 18 (SUTURE) IMPLANT
SUT PDS 5 0 RB 1 (SUTURE) ×2 IMPLANT
SUT PDS AB 1 TP1 96 (SUTURE) ×6 IMPLANT
SUT PDS AB 3-0 SH 27 (SUTURE) ×2 IMPLANT
SUT PDS AB 4-0 RB1 27 (SUTURE) ×38 IMPLANT
SUT PDS II 5-0 RB-2 VIOLET (SUTURE) ×29 IMPLANT
SUT PDS PLUS AB 5-0 RB-1 (SUTURE) IMPLANT
SUT PROLENE 2 0 CT 1 (SUTURE) ×1 IMPLANT
SUT PROLENE 3 0 SH 48 (SUTURE) ×3 IMPLANT
SUT PROLENE 4 0 RB 1 (SUTURE) ×18
SUT PROLENE 4-0 RB1 .5 CRCL 36 (SUTURE) ×2 IMPLANT
SUT PROLENE 5 0 C 1 24 (SUTURE) ×2 IMPLANT
SUT PROLENE 5 0 RB 1 DA (SUTURE) ×4 IMPLANT
SUT PROLENE 6 0 CC (SUTURE) ×3 IMPLANT
SUT SILK 2 0 TIES 10X30 (SUTURE) ×4 IMPLANT
SUT SILK 2 0SH CR/8 30 (SUTURE) IMPLANT
SUT SILK 3 0 TIES 10X30 (SUTURE) ×3 IMPLANT
SUT SILK 3 0SH CR/8 30 (SUTURE) ×5 IMPLANT
SUT VIC AB 2-0 CT1 27 (SUTURE)
SUT VIC AB 2-0 CT1 TAPERPNT 27 (SUTURE) IMPLANT
SUT VIC AB 2-0 SH 18 (SUTURE) IMPLANT
SUT VIC AB 3-0 MH 27 (SUTURE) ×6 IMPLANT
SUT VIC AB 3-0 SH 18 (SUTURE) ×3 IMPLANT
SUT VIC AB 3-0 SH 27 (SUTURE) ×3
SUT VIC AB 3-0 SH 27X BRD (SUTURE) IMPLANT
SUT VIC AB 3-0 SH 8-18 (SUTURE) ×2 IMPLANT
SUT VICRYL AB 2 0 TIES (SUTURE) IMPLANT
TAPE UMBILICAL 1/8 X36 TWILL (MISCELLANEOUS) IMPLANT
TOWEL GREEN STERILE (TOWEL DISPOSABLE) ×3 IMPLANT
TOWEL GREEN STERILE FF (TOWEL DISPOSABLE) ×3 IMPLANT
TRAY FOLEY MTR SLVR 14FR STAT (SET/KITS/TRAYS/PACK) ×3 IMPLANT
TUBE FEEDING 8FR 16IN STR KANG (MISCELLANEOUS) IMPLANT
TUBE FEEDING ENTERAL 5FR 16IN (TUBING) IMPLANT

## 2020-09-22 NOTE — Progress Notes (Signed)
Dr. Lissa Hoard made aware of Zybeta not being taken this morning. Nurse was told to not give orally due to type of surgery being performed. They will monitor closely in surgery,.

## 2020-09-22 NOTE — Anesthesia Procedure Notes (Deleted)
Epidural Patient location during procedure: OB Start time: 09/13/2020 7:35 AM End time: 09/24/2020 7:55 AM  Staffing Anesthesiologist: Nolon Nations, MD Performed: anesthesiologist   Preanesthetic Checklist Completed: patient identified, IV checked, risks and benefits discussed, monitors and equipment checked, pre-op evaluation and timeout performed  Epidural Patient position: sitting Prep: DuraPrep and site prepped and draped Patient monitoring: heart rate, continuous pulse ox and blood pressure Approach: right paramedian Location: thoracic (1-12) Injection technique: LOR air and LOR saline  Needle:  Needle type: Tuohy  Needle gauge: 17 G Needle length: 9 cm Needle insertion depth: 9 cm Catheter type: closed end flexible Catheter size: 19 Gauge Catheter at skin depth: 15 cm Test dose: negative  Assessment Sensory level: T8 Events: blood not aspirated, injection not painful, no injection resistance, no paresthesia and negative IV test  Additional Notes Reason for block:procedure for pain

## 2020-09-22 NOTE — Transfer of Care (Signed)
Immediate Anesthesia Transfer of Care Note  Patient: [name]  Procedure(s) Performed: WHIPPLE PROCEDURE (N/A Abdomen) CHOLECYSTECTOMY (N/A Abdomen) RIGHT TOTAL COLECTOMY (N/A Abdomen) COLOSTOMY PATCH ANGIOPLASTY SUPERIOR MESENTERIC VEIN (N/A Abdomen)  Patient Location: PACU  Anesthesia Type:General  Level of Consciousness: awake and patient cooperative  Airway & Oxygen Therapy: Patient Spontanous Breathing and Patient connected to face mask oxygen  Post-op Assessment: Report given to RN and Post -op Vital signs reviewed and stable  Post vital signs: Reviewed and stable  Last Vitals:  Vitals Value Taken Time  BP 118/64 09/21/2020 1733  Temp    Pulse 57 09/14/2020 1737  Resp 22 09/26/2020 1737  SpO2 100 % 09/14/2020 1737  Vitals shown include unvalidated device data.  Last Pain:  Vitals:   09/30/2020 0700  TempSrc:   PainSc: 0-No pain         Complications: No complications documented.

## 2020-09-22 NOTE — Op Note (Signed)
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 Pylorus-preserving pancreaticoduodenectomy (Whipple procedure) with en-bloc right hemicolectomy and end ileostomy 2. Cholecystectomy  Surgeon: Michaelle Birks, MD Assistants: Georganna Skeans MD, Gurney Maxin MD, Stark Klein MD, Puja Gosai PA-C, Reather Laurence MD  EBL: 1800 mL  Anesthesia: General  Specimens:  1. Gallbladder 2. Whipple specimen with right colon 3. Final pancreatic neck margin  Indications: Mr. Stief is a 50 yo male who presented with choledocholithiasis and RUQ abdominal pain. Imaging workup incidentally showed a 4cm mass adjacent to the head of the pancreas. He underwent ERCP with clearance of the common bile duct, later followed by EUS with biopsy of the pancreatic mass. Biopsy showed a neuroendocrine tumor. After an extensive discussion of the risks and benefits of surgery, Whipple resection was recommended to him and he agreed to proceed.  Findings: 4cm well-circumscribed neuroendocrine tumor at the root of the mesentery, involving the head of the pancreas and middle colic vessels, and abutting the superior mesenteric vein. Tumor was resected via a Whipple and en bloc right hemicolectomy. Bovine patch angioplasty of the SMV was performed by vascular surgery. Cholecystectomy performed with removal of the previously placed percutaneous cholecystostomy.  Procedure details: Informed consent was obtained in the preoperative area prior to the procedure. The patient was brought to the operating room and placed on the table in the supine position. General anesthesia was induced and appropriate lines and drains were placed for intraoperative monitoring. Perioperative antibiotics were administered per SCIP guidelines. The abdomen was prepped and draped in the usual sterile  fashion. A pre-procedure timeout was taken verifying patient identity, surgical site and procedure to be performed.  An upper midline skin incision was made and the subcutaneous tissue was divided with cautery. The fascia was elevated and incised at the linea alba, and opened the full length of the incision. The falciform ligament was ligated with 2-0 silk ties and divided. An Allexis wound protector and Bookwalter fixed retractor were placed. The abdomen was explored and there was no evidence of metastatic disease. A smooth mass was visualized at the root of the tranverse colon mesentery, adjacent to the head of the pancreas. The duodenum was widely kocherized to expose the IVC and aorta. The mass appeared encapsulated so attempts were made to dissect it off the head of the pancreas however this resulted in bleeding, so I determined that a Whipple would be necessary to safely and completely remove the tumor. In addition the mass appeared to involve the middle colic vessels. The gastrocolic omentum was divided using the harmonic, opening the lesser sac. The inferior border of the pancreas at the neck was identified and dissected out and the SMV was identified. A plane was created between the SMV and the neck of the pancreas using careful blunt dissection. Next a cholecystectomy was performed. The percutaneous cholecystostomy was removed, and the gallbladder was taken off the liver in a dome down fashion using cautery. The gallbladder wall was very thickened, consistent with cholecystitis, and there were numerous gallstones. The cystic duct and cystic artery were tied with 2-0 silk ties and divided. The gallbladder was passed off the field and sent for routine pathology. The proximal duodenum was circumferentially dissected out and the right gastroepiploic vessels and right gastric artery were ligated with 2-0 silk ties and divided. The proximal duodenum was transected just distal to the pylorus using  a 43mm GIA  blue load stapler. Next the common bile duct was circumferentially dissected out and divided at the level of the cystic duct stump using cautery. The periportal and common hepatic lymph nodes were enlarged but soft, consistent with reactive lymphadenopathy. The GDA was then identified and circumferentially dissected out. The GDA was occluded and a pulse remained in the common hepatic artery. The GDA was then double ligated with a 2-0 silk tie and 4-0 prolene suture and divided. The portal vein was visualized on the superior border of the pancreas, and a tunnel was bluntly created over the portal vein beneath the neck of the pancreas. The pancreas was divided at the neck using cautery on the anterior aspect. The posterior aspect was divided sharply to preserve the duct. Hemostasis was then achieved on the cut surface of the pancreas using cautery. Next the jejunum was transected approximately 30cm distal to the ligament of Treitz with a 65mm GIA blue load stapler. The mesentery was divided with the Harmonic, the ligament of Treitz was taken down with cautery, and the proximal jejunum was brought underneath the middle colic vessels into the RUQ. Because the mass involved the middle colic vessels I elected to perform a right colectomy. The transverse colon was transected distal to the middle colic vessels using a 26ST GIA blue load stapler. The terminal ileum was transected about 15cm from the ileocecal valve using a 88mm blue load stapler. The intervening mesentery was divided with the Harmonic, leaving the specimen attached to the tumor. Next the uncinate process of the pancreas was taken off the lateral portal vein and SMA using the harmonic. Vascular branches were clamped and tied with 2-0 silk ties. At this point the mass was adherent to the distal SMV but was clearly encapsulated and not invading the vessel. Proximal control of the SMV was obtained with a vessel loop. The mass was carefully dissected off the SMV,  and several small branches were ligated with 2-0 silk ties. During this dissection there was an injury to the anterior surface of the distal SMV. This was repaired with a running 5-0 prolene suture and hemostasis was achieved. The mass was completely dissected off the vessel and the Whipple specimen and right colon were removed en bloc. The specimen was oriented and sent to pathology. Frozen sections of the bile duct and pancreatic neck margins were benign.  The SMV repair site was examined and the vessel was significantly narrowed at this point. Vascular surgery was consulted to repair the vessel, and Dr. Oneida Alar took down the previous repair and performed a bovine patch repair. Please see his separately dictated operative note for details of this portion of the procedure. At the completion of this repair, a doppler probe confirmed there was flow in the SMV and main portal vein distal to the repair.   Next the proximal limb of remaining jejunum was brought up into the RUQ and placed adjacent to the pancreas. The pancreas was very soft with a 16mm duct thus I elected to place an externalized pancreatic stent to mitigate the risk of postoperative pancreatic fistula. An end-to-side pancreaticojejunostomy was created in 2 layers. A back layer of interrupted 3-0 silk sutures was placed between the pancreatic parenchyma and seromuscular layers of the jejunum. A small jejunotomy was made and a back row of 5-0 PDS sutures were placed in a duct-to-mucosa fashion between the pancreatic duct and jejunum. A 5-Fr pediatric feed tube was brought onto the field and used to cannulate the pancreatic  duct across the anastomosis, and the end of the tube was brought out through the nearby jejunum. The duct-to-mucosal anastomosis was then completed over the tube using interrupted 5-0 PDS. An anterior row of 3-0 silk sutures was placed between the pancreatic capsule and seromuscular layer of jejunum. A Witzel tunnel was then created  at the exit site of the pancreatic stent from the bowel using 4-0 chromic suture. Next an end-side choledochojejunostomy was created using interrupted 4-0 PDS. About 40cm downstream from this an end-to-side duodenojejunostomy was created in two layers, using interrupted 3-0 silk for the outer layer and running 3-0 Vicryl on the inner layer. At this point the patient had had about 2L of blood loss and was on some vasoactive medications, so I opted to place an end ileostomy instead of creating an ileocolic anastomosis. The abdomen was irrigated with warmed saline. There was oozing on the retroperitoneal surface which was controlled with pressure and application of hemostatic agent. A circular incision was made on the skin over the right lower quadrant abdominal wall and the subcutaneous tissue was divided with cautery. A vertical incision was made in anterior rectus sheath, the rectus muscle fibers were spread, and the posterior rectus sheath was incised. The end of the ileum was brought out through this incision, taking care not to twist the mesentery. Two 19-Fr Jackson-Pratt drains were placed in the RUQ, one anterior to the bile duct anastomosis and one posterior to the bile duct and pancreatic anastomoses. They were brought out through the right abdominal wall and secured to the skin with 2-0 Nylon suture. The pancreatic duct stent was brought out through the left abdominal wall and secured to the skin with 3-0 Nylon. The falciform ligament was placed over the GDA stump. A doppler probe was again placed on the SMV prior to closure and there was flow in the SMV and in the main portal vein. The fascia was closed with a running looped 1 PDS and the skin was closed with staples. A sterile dressing was applied. The ileostomy was matured in Two Strike fashion with 3-0 Vicryl suture and an ostomy appliance was applied.  All counts were correct x2 at the end of the procedure. The patient was extubated and taken to PACU in  stable condition at the completion of the procedure.  Michaelle Birks, MD 10/01/2020 5:33 PM

## 2020-09-22 NOTE — Progress Notes (Signed)
Pts BP low, already on 2 vasopressors.  Dr. Barry Dienes made aware.  Blood products, albumin, labs, ABG, vasopressin ordered.  Also communicated pts low urine output.

## 2020-09-22 NOTE — H&P (Signed)
[name] is an 50 y.o. male.    HPI:  [name] is a 50 yo male who was referred for evaluation of a neuroendocrine tumor. He presented in November 2021 with jaundice and abdominal pain and a CT scan showed a 4cm mass adjacent to the head of the pancreas. He underwent ERCP on 11/16, and sludge was swept from the bile duct. A sphincterotomy was performed. LFTs subsequently normalized. He was discharged home and underwent an outpatient EUS on 06/25/20. This showed extrinsic compression of the third portion of the duodenum, with an adjacent 4cm mass that appeared to be mesenteric. FNA was performed and showed a neuroendocrine tumor. The patient was referred to me and prior to scheduled resection he developed acute cholecystitis in February. He underwent placement of a perc cholecystostomy tube with subsequent resolution of his symptoms. He presents today for surgery. COVID negative on 09/19/20. He has been stratified by cardiology as moderate risk for surgery with stable disease and no further cardiac workup needed.  Past Medical History:  Diagnosis Date  . AICD (automatic cardioverter/defibrillator) present   . Dyslipidemia   . Heart failure with mildly reduced EF    EF 35-40 in 2011 // Echo 06/2019: EF 45-50, Gr 2 DD, RAE, mild MR, mild TR, mild PI, RVSP 33.6  . History of kidney stones   . HTN (hypertension)   . Hx of ventricular fibrillation    Hx of VFib arrest s/p AICD placement  . Nonischemic cardiomyopathy     Past Surgical History:  Procedure Laterality Date  . CARDIAC DEFIBRILLATOR PLACEMENT    . ERCP N/A 05/27/2020   Procedure: ENDOSCOPIC RETROGRADE CHOLANGIOPANCREATOGRAPHY (ERCP);  Surgeon: Clarene Essex, MD;  Location: [facility];  Service: Endoscopy;  Laterality: N/A;  . ESOPHAGOGASTRODUODENOSCOPY (EGD) WITH PROPOFOL N/A 06/25/2020   Procedure: ESOPHAGOGASTRODUODENOSCOPY (EGD) WITH PROPOFOL;  Surgeon: Arta Silence, MD;  Location: [facility] ENDOSCOPY;  Service: Endoscopy;   Laterality: N/A;  . FINE NEEDLE ASPIRATION N/A 06/25/2020   Procedure: FINE NEEDLE ASPIRATION (FNA) LINEAR;  Surgeon: Arta Silence, MD;  Location: [facility] ENDOSCOPY;  Service: Endoscopy;  Laterality: N/A;  . IR PERC CHOLECYSTOSTOMY  08/27/2020  . PACEMAKER IMPLANT    . REMOVAL OF STONES  05/27/2020   Procedure: REMOVAL OF STONES;  Surgeon: Clarene Essex, MD;  Location: [facility] ENDOSCOPY;  Service: Endoscopy;;  . repeat echo  2008    demonstrated near normalization  . SPHINCTEROTOMY  05/27/2020   Procedure: SPHINCTEROTOMY;  Surgeon: Clarene Essex, MD;  Location: [facility] END[facility]COPY;  Service: Endoscopy;;  . TRANSTHORACIC ECHOCARDIOGRAM  11/09/2005, 01/15/2009, 02/07/2010  . UPPER ESOPHAGEAL ENDOSCOPIC ULTRASOUND (EUS) N/A 06/25/2020   Procedure: UPPER ESOPHAGEAL ENDOSCOPIC ULTRASOUND (EUS);  Surgeon: Arta Silence, MD;  Location: [facility] ENDOSCOPY;  Service: Endoscopy;  Laterality: N/A;    Family History  Problem Relation Age of Onset  . Diabetes Father   . Heart Problems Father        unsure of what kind  . Asthma Maternal Uncle   . Diabetes Maternal Grandmother    Social History:  reports that he quit smoking about 22 years ago. His smoking use included cigarettes. He has a 0.50 pack-year smoking history. He has never used smokeless tobacco. He reports that he does not drink alcohol and does not use drugs.  Allergies:  Allergies  Allergen Reactions  . Ace Inhibitors Cough    Medications Prior to Admission  Medication Sig Dispense Refill  . acetaminophen (TYLENOL) 500 MG tablet Take 2 tablets (1,000 mg total) by  mouth every 6 (six) hours. (Patient taking differently: Take 1,000 mg by mouth every 6 (six) hours as needed for mild pain.) 90 tablet 1  . allopurinol (ZYLOPRIM) 100 MG tablet Take one tablet by mouth daily for 1 week and then 2 tablets by mouth daily (Patient taking differently: Take 200 mg by mouth daily.) 60 tablet 6  . amiodarone (PACERONE) 200 MG tablet Take 1 tablet (200 mg total) by  mouth daily. (Patient taking differently: Take 200 mg by mouth daily with supper.) 90 tablet 2  . aspirin 81 MG tablet Take 1 tablet (81 mg total) by mouth 2 (two) times daily. (Patient taking differently: Take 81 mg by mouth daily.) 30 tablet 11  . bisoprolol (ZEBETA) 10 MG tablet Take 1 tablet by mouth once daily (Patient taking differently: Take 10 mg by mouth daily.) 90 tablet 3  . budesonide-formoterol (SYMBICORT) 80-4.5 MCG/ACT inhaler Inhale 2 puffs into the lungs 2 (two) times daily. Must keep upcoming office visit for refills (Patient taking differently: Inhale 2 puffs into the lungs 2 (two) times daily as needed (for flares).) 1 Inhaler 0  . sacubitril-valsartan (ENTRESTO) 24-26 MG Take 1 tablet by mouth 2 (two) times daily. 180 tablet 3  . oxyCODONE (OXY IR/ROXICODONE) 5 MG immediate release tablet Take 1-2 tablets (5-10 mg total) by mouth every 6 (six) hours as needed for severe pain. (Patient not taking: Reported on 09/10/2020) 30 tablet 0    Results for orders placed or performed during the hospital encounter of 09/30/2020 (from the past 48 hour(s))  Prepare RBC (crossmatch)     Status: None   Collection Time: 09/15/2020  7:00 AM  Result Value Ref Range   Order Confirmation      ORDER PROCESSED BY BLOOD BANK Performed at [facility], [place] [address], [place], [place] [address]    No results found.  Review of Systems  Constitutional: Negative for chills and fever.  Respiratory: Negative for shortness of breath.   Cardiovascular: Negative for chest pain.  Gastrointestinal: Negative for abdominal pain, nausea and vomiting.  Allergic/Immunologic: Negative for immunocompromised state.  Neurological: Negative for facial asymmetry and speech difficulty.  Psychiatric/Behavioral: Negative for agitation and confusion.    Blood pressure 128/75, pulse (!) 59, temperature 98.3 F (36.8 C), temperature source Oral, resp. rate 17, height 6\' 2"  (1.88 m), weight 95.3 kg, SpO2 100  %. Physical Exam Constitutional:      Appearance: Normal appearance.  HENT:     Head: Normocephalic and atraumatic.  Eyes:     General: No scleral icterus.    Conjunctiva/sclera: Conjunctivae normal.  Pulmonary:     Effort: Pulmonary effort is normal. No respiratory distress.  Abdominal:     General: Abdomen is flat. There is no distension.     Palpations: Abdomen is soft.     Comments: Perc chole in place RUQ with bilious fluid  Musculoskeletal:        General: Normal range of motion.     Cervical back: Normal range of motion.  Skin:    General: Skin is warm and dry.  Neurological:     General: No focal deficit present.     Mental Status: He is alert and oriented to person, place, and time.  Psychiatric:        Mood and Affect: Mood normal.        Behavior: Behavior normal.        Thought Content: Thought content normal.      Assessment/Plan 49  yo male with a 4cm neuroendocrine tumor of the duodenum/colonic mesentery. Proceed to OR for exploratory laparotomy, cholecystectomy, likely Whipple, and possible partial colectomy. Details and risks of the procedure have been discussed extensively during clinic visits. Informed consent obtained. Admit to inpatient postoperatively.  [name], MD 10/09/2020, 6:56 AM

## 2020-09-22 NOTE — Anesthesia Procedure Notes (Signed)
Central Venous Catheter Insertion Performed by: Nolon Nations, MD, anesthesiologist Start/End03/21/2022 12:10 PM, 10/04/2020 12:40 PM Patient location: Pre-op. Preanesthetic checklist: patient identified, IV checked, site marked, risks and benefits discussed, surgical consent, monitors and equipment checked, pre-op evaluation, timeout performed and anesthesia consent Position: Trendelenburg Lidocaine 1% used for infiltration and patient sedated Hand hygiene performed , maximum sterile barriers used  and Seldinger technique used Catheter size: 8.5 Fr Central line was placed.Sheath introducer Swan type:thermodilution Procedure performed using ultrasound guided technique. Ultrasound Notes:anatomy identified, needle tip was noted to be adjacent to the nerve/plexus identified, no ultrasound evidence of intravascular and/or intraneural injection and image(s) printed for medical record Attempts: 1 Following insertion, line sutured and dressing applied. Post procedure assessment: blood return through all ports, free fluid flow and no air  Patient tolerated the procedure well with no immediate complications.

## 2020-09-22 NOTE — Anesthesia Procedure Notes (Signed)
Epidural Patient location during procedure: pre-op  Staffing Anesthesiologist: Nolon Nations, MD Performed: anesthesiologist   Preanesthetic Checklist Completed: patient identified, IV checked, risks and benefits discussed, surgical consent, monitors and equipment checked, pre-op evaluation and timeout performed  Epidural Prep: DuraPrep, site prepped and draped and Full Sterile technique. Gown/glove, drarpe, mask Patient monitoring: heart rate, cardiac monitor, continuous pulse ox and blood pressure Approach: right paramedian Location: thoracic (1-12) Injection technique: LOR air and LOR saline  Needle:  Needle type: Tuohy  Needle gauge: 17 G Needle length: 9 cm Needle insertion depth: 9 cm Catheter type: closed end flexible Catheter size: 19 Gauge Catheter at skin depth: 15 cm Test dose: negative and 1.5% lidocaine with Epi 1:200 K  Assessment Events: blood not aspirated, injection not painful, no injection resistance, no paresthesia and negative IV test

## 2020-09-22 NOTE — Anesthesia Procedure Notes (Addendum)
Procedure Name: Intubation Date/Time: 10/07/2020 8:11 AM Performed by: Glynda Jaeger, CRNA Pre-anesthesia Checklist: Patient identified, Emergency Drugs available, Suction available and Patient being monitored Patient Re-evaluated:Patient Re-evaluated prior to induction Oxygen Delivery Method: Circle System Utilized and Circle system utilized Preoxygenation: Pre-oxygenation with 100% oxygen Induction Type: IV induction Ventilation: Mask ventilation without difficulty and Oral airway inserted - appropriate to patient size Laryngoscope Size: Mac and 3 Grade View: Grade I Tube type: Oral Tube size: 7.5 mm Number of attempts: 1 Airway Equipment and Method: Stylet and Oral airway Placement Confirmation: ETT inserted through vocal cords under direct vision,  positive ETCO2,  breath sounds checked- equal and bilateral and CO2 detector Secured at: 23 cm Tube secured with: Tape Dental Injury: Teeth and Oropharynx as per pre-operative assessment  Comments: Placed by Joaquim Lai

## 2020-09-22 NOTE — Progress Notes (Signed)
Critical lab values communicated with Dr. Barry Dienes  Ph 7.127 Bicarb 9.1 Hgb 5.8  Orders given including 2 amps bicarb, blood products, calcium, etc.

## 2020-09-22 NOTE — Anesthesia Procedure Notes (Signed)
Arterial Line Insertion Start/End03/18/2022 7:16 AM, 10/09/2020 7:25 AM Performed by: Glynda Jaeger, CRNA, CRNA  Patient location: Pre-op. Preanesthetic checklist: patient identified, IV checked, site marked, risks and benefits discussed, monitors and equipment checked, pre-op evaluation and timeout performed Left, radial was placed Catheter size: 20 G Hand hygiene performed  and maximum sterile barriers used   Attempts: 2 Procedure performed without using ultrasound guided technique. Following insertion, Biopatch and dressing applied. Post procedure assessment: normal  Patient tolerated the procedure well with no immediate complications.

## 2020-09-22 NOTE — Op Note (Signed)
Procedure: Patch angioplasty superior mesenteric vein with bovine pericardial patch  Preoperative diagnosis: Laceration superior mesenteric vein  Postoperative diagnosis: Same  Assistant: Michaelle Birks, MD  Operative findings: I was called intraoperatively to evaluate a laceration in the superior mesenteric vein encountered during resection of a pancreatic tumor.  The vein had been primarily repaired but it was noted by Dr. Zenia Resides that the vein was significantly narrowed after this.  Operative details: The superior mesenteric vein had been fairly skeletonized prior to my arrival.  I was able to get a small bulldog clamp proximal and distal to the area of laceration.  I then removed the running suture line.  The lumen was patent.  There was adequate inflow and outflow.  This was thoroughly irrigated with heparinized saline.  I did not systemically heparinized the patient due to the complexity and extent of the pre-existing ongoing operation.  A bovine pericardial patch was brought up in the operative field and sewn on as a patch angioplasty to the anterior wall of the vein using a running 6-0 Prolene suture.  Despite completion of anastomosis it was for blood backbled and thoroughly flushed.  Anastomosis was secured clamps released there was good Doppler flow in the meeting immediately and the vein filled nearly to its normal anatomic diameter.  Hemostasis was obtained with direct pressure and one additional repair stitch.  At this point the procedure was turned back over to Dr. Zenia Resides.  Ruta Hinds, MD [facility] Office: [phone]

## 2020-09-23 ENCOUNTER — Inpatient Hospital Stay (HOSPITAL_COMMUNITY): Payer: Managed Care, Other (non HMO) | Admitting: Certified Registered Nurse Anesthetist

## 2020-09-23 ENCOUNTER — Encounter (HOSPITAL_COMMUNITY): Admission: RE | Disposition: E | Payer: Self-pay | Source: Home / Self Care | Attending: Surgery

## 2020-09-23 ENCOUNTER — Inpatient Hospital Stay (HOSPITAL_COMMUNITY): Payer: Managed Care, Other (non HMO)

## 2020-09-23 ENCOUNTER — Inpatient Hospital Stay (HOSPITAL_COMMUNITY): Payer: Managed Care, Other (non HMO) | Admitting: Certified Registered"

## 2020-09-23 ENCOUNTER — Encounter (HOSPITAL_COMMUNITY): Payer: Self-pay | Admitting: Surgery

## 2020-09-23 DIAGNOSIS — R578 Other shock: Secondary | ICD-10-CM

## 2020-09-23 DIAGNOSIS — N179 Acute kidney failure, unspecified: Secondary | ICD-10-CM | POA: Diagnosis not present

## 2020-09-23 DIAGNOSIS — J9601 Acute respiratory failure with hypoxia: Secondary | ICD-10-CM

## 2020-09-23 DIAGNOSIS — D689 Coagulation defect, unspecified: Secondary | ICD-10-CM | POA: Diagnosis not present

## 2020-09-23 HISTORY — PX: IR ANGIOGRAM SELECTIVE EACH ADDITIONAL VESSEL: IMG667

## 2020-09-23 HISTORY — PX: RADIOLOGY WITH ANESTHESIA: SHX6223

## 2020-09-23 HISTORY — PX: IR ANGIOGRAM VISCERAL SELECTIVE: IMG657

## 2020-09-23 HISTORY — PX: LAPAROTOMY: SHX154

## 2020-09-23 HISTORY — PX: APPLICATION OF WOUND VAC: SHX5189

## 2020-09-23 LAB — PREPARE FRESH FROZEN PLASMA: Unit division: 0

## 2020-09-23 LAB — POCT I-STAT 7, (LYTES, BLD GAS, ICA,H+H)
Acid-base deficit: 21 mmol/L — ABNORMAL HIGH (ref 0.0–2.0)
Acid-base deficit: 17 mmol/L — ABNORMAL HIGH (ref 0.0–2.0)
Acid-base deficit: 5 mmol/L — ABNORMAL HIGH (ref 0.0–2.0)
Acid-base deficit: 10 mmol/L — ABNORMAL HIGH (ref 0.0–2.0)
Acid-base deficit: 10 mmol/L — ABNORMAL HIGH (ref 0.0–2.0)
Acid-base deficit: 5 mmol/L — ABNORMAL HIGH (ref 0.0–2.0)
Acid-base deficit: 5 mmol/L — ABNORMAL HIGH (ref 0.0–2.0)
Acid-base deficit: 6 mmol/L — ABNORMAL HIGH (ref 0.0–2.0)
Acid-base deficit: 6 mmol/L — ABNORMAL HIGH (ref 0.0–2.0)
Acid-base deficit: 2 mmol/L (ref 0.0–2.0)
Bicarbonate: 8.1 mmol/L — ABNORMAL LOW (ref 20.0–28.0)
Bicarbonate: 13.1 mmol/L — ABNORMAL LOW (ref 20.0–28.0)
Bicarbonate: 21.7 mmol/L (ref 20.0–28.0)
Bicarbonate: 16.9 mmol/L — ABNORMAL LOW (ref 20.0–28.0)
Bicarbonate: 16.5 mmol/L — ABNORMAL LOW (ref 20.0–28.0)
Bicarbonate: 21.1 mmol/L (ref 20.0–28.0)
Bicarbonate: 21.4 mmol/L (ref 20.0–28.0)
Bicarbonate: 21.7 mmol/L (ref 20.0–28.0)
Bicarbonate: 21.1 mmol/L (ref 20.0–28.0)
Bicarbonate: 22.6 mmol/L (ref 20.0–28.0)
Calcium, Ion: 1.07 mmol/L — ABNORMAL LOW (ref 1.15–1.40)
Calcium, Ion: 0.97 mmol/L — ABNORMAL LOW (ref 1.15–1.40)
Calcium, Ion: 0.76 mmol/L — CL (ref 1.15–1.40)
Calcium, Ion: 0.82 mmol/L — CL (ref 1.15–1.40)
Calcium, Ion: 0.66 mmol/L — CL (ref 1.15–1.40)
Calcium, Ion: 0.6 mmol/L — CL (ref 1.15–1.40)
Calcium, Ion: 0.67 mmol/L — CL (ref 1.15–1.40)
Calcium, Ion: 0.81 mmol/L — CL (ref 1.15–1.40)
Calcium, Ion: 0.81 mmol/L — CL (ref 1.15–1.40)
Calcium, Ion: 0.88 mmol/L — CL (ref 1.15–1.40)
HCT: 16 % — ABNORMAL LOW (ref 39.0–52.0)
HCT: 28 % — ABNORMAL LOW (ref 39.0–52.0)
HCT: 24 % — ABNORMAL LOW (ref 39.0–52.0)
HCT: 27 % — ABNORMAL LOW (ref 39.0–52.0)
HCT: 25 % — ABNORMAL LOW (ref 39.0–52.0)
HCT: 22 % — ABNORMAL LOW (ref 39.0–52.0)
HCT: 25 % — ABNORMAL LOW (ref 39.0–52.0)
HCT: 26 % — ABNORMAL LOW (ref 39.0–52.0)
HCT: 24 % — ABNORMAL LOW (ref 39.0–52.0)
HCT: 26 % — ABNORMAL LOW (ref 39.0–52.0)
Hemoglobin: 5.4 g/dL — CL (ref 13.0–17.0)
Hemoglobin: 9.5 g/dL — ABNORMAL LOW (ref 13.0–17.0)
Hemoglobin: 8.2 g/dL — ABNORMAL LOW (ref 13.0–17.0)
Hemoglobin: 9.2 g/dL — ABNORMAL LOW (ref 13.0–17.0)
Hemoglobin: 8.5 g/dL — ABNORMAL LOW (ref 13.0–17.0)
Hemoglobin: 7.5 g/dL — ABNORMAL LOW (ref 13.0–17.0)
Hemoglobin: 8.5 g/dL — ABNORMAL LOW (ref 13.0–17.0)
Hemoglobin: 8.8 g/dL — ABNORMAL LOW (ref 13.0–17.0)
Hemoglobin: 8.2 g/dL — ABNORMAL LOW (ref 13.0–17.0)
Hemoglobin: 8.8 g/dL — ABNORMAL LOW (ref 13.0–17.0)
O2 Saturation: 96 %
O2 Saturation: 100 %
O2 Saturation: 100 %
O2 Saturation: 100 %
O2 Saturation: 99 %
O2 Saturation: 100 %
O2 Saturation: 100 %
O2 Saturation: 100 %
O2 Saturation: 100 %
O2 Saturation: 100 %
Patient temperature: 98.1
Patient temperature: 33.8
Patient temperature: 35.9
Patient temperature: 96.4
Patient temperature: 96.4
Patient temperature: 34.8
Patient temperature: 35.6
Patient temperature: 35.7
Patient temperature: 97.7
Potassium: 5.1 mmol/L (ref 3.5–5.1)
Potassium: 5.1 mmol/L (ref 3.5–5.1)
Potassium: 4.5 mmol/L (ref 3.5–5.1)
Potassium: 4.6 mmol/L (ref 3.5–5.1)
Potassium: 5 mmol/L (ref 3.5–5.1)
Potassium: 4.6 mmol/L (ref 3.5–5.1)
Potassium: 4.9 mmol/L (ref 3.5–5.1)
Potassium: 4.9 mmol/L (ref 3.5–5.1)
Potassium: 4.6 mmol/L (ref 3.5–5.1)
Potassium: 4.6 mmol/L (ref 3.5–5.1)
Sodium: 142 mmol/L (ref 135–145)
Sodium: 144 mmol/L (ref 135–145)
Sodium: 152 mmol/L — ABNORMAL HIGH (ref 135–145)
Sodium: 148 mmol/L — ABNORMAL HIGH (ref 135–145)
Sodium: 147 mmol/L — ABNORMAL HIGH (ref 135–145)
Sodium: 146 mmol/L — ABNORMAL HIGH (ref 135–145)
Sodium: 147 mmol/L — ABNORMAL HIGH (ref 135–145)
Sodium: 148 mmol/L — ABNORMAL HIGH (ref 135–145)
Sodium: 148 mmol/L — ABNORMAL HIGH (ref 135–145)
Sodium: 146 mmol/L — ABNORMAL HIGH (ref 135–145)
TCO2: 9 mmol/L — ABNORMAL LOW (ref 22–32)
TCO2: 15 mmol/L — ABNORMAL LOW (ref 22–32)
TCO2: 23 mmol/L (ref 22–32)
TCO2: 18 mmol/L — ABNORMAL LOW (ref 22–32)
TCO2: 18 mmol/L — ABNORMAL LOW (ref 22–32)
TCO2: 23 mmol/L (ref 22–32)
TCO2: 23 mmol/L (ref 22–32)
TCO2: 23 mmol/L (ref 22–32)
TCO2: 23 mmol/L (ref 22–32)
TCO2: 24 mmol/L (ref 22–32)
pCO2 arterial: 32.7 mmHg (ref 32.0–48.0)
pCO2 arterial: 43.7 mmHg (ref 32.0–48.0)
pCO2 arterial: 43.9 mmHg (ref 32.0–48.0)
pCO2 arterial: 40.7 mmHg (ref 32.0–48.0)
pCO2 arterial: 35.6 mmHg (ref 32.0–48.0)
pCO2 arterial: 42.7 mmHg (ref 32.0–48.0)
pCO2 arterial: 42.7 mmHg (ref 32.0–48.0)
pCO2 arterial: 44.2 mmHg (ref 32.0–48.0)
pCO2 arterial: 48.1 mmHg — ABNORMAL HIGH (ref 32.0–48.0)
pCO2 arterial: 33.9 mmHg (ref 32.0–48.0)
pH, Arterial: 7.001 — CL (ref 7.350–7.450)
pH, Arterial: 7.064 — CL (ref 7.350–7.450)
pH, Arterial: 7.297 — ABNORMAL LOW (ref 7.350–7.450)
pH, Arterial: 7.22 — ABNORMAL LOW (ref 7.350–7.450)
pH, Arterial: 7.269 — ABNORMAL LOW (ref 7.350–7.450)
pH, Arterial: 7.291 — ABNORMAL LOW (ref 7.350–7.450)
pH, Arterial: 7.293 — ABNORMAL LOW (ref 7.350–7.450)
pH, Arterial: 7.301 — ABNORMAL LOW (ref 7.350–7.450)
pH, Arterial: 7.251 — ABNORMAL LOW (ref 7.350–7.450)
pH, Arterial: 7.429 (ref 7.350–7.450)
pO2, Arterial: 118 mmHg — ABNORMAL HIGH (ref 83.0–108.0)
pO2, Arterial: 401 mmHg — ABNORMAL HIGH (ref 83.0–108.0)
pO2, Arterial: 373 mmHg — ABNORMAL HIGH (ref 83.0–108.0)
pO2, Arterial: 284 mmHg — ABNORMAL HIGH (ref 83.0–108.0)
pO2, Arterial: 184 mmHg — ABNORMAL HIGH (ref 83.0–108.0)
pO2, Arterial: 288 mmHg — ABNORMAL HIGH (ref 83.0–108.0)
pO2, Arterial: 292 mmHg — ABNORMAL HIGH (ref 83.0–108.0)
pO2, Arterial: 445 mmHg — ABNORMAL HIGH (ref 83.0–108.0)
pO2, Arterial: 331 mmHg — ABNORMAL HIGH (ref 83.0–108.0)
pO2, Arterial: 352 mmHg — ABNORMAL HIGH (ref 83.0–108.0)

## 2020-09-23 LAB — CBC
HCT: 20.4 % — ABNORMAL LOW (ref 39.0–52.0)
HCT: 31.1 % — ABNORMAL LOW (ref 39.0–52.0)
HCT: 25.7 % — ABNORMAL LOW (ref 39.0–52.0)
HCT: 30.3 % — ABNORMAL LOW (ref 39.0–52.0)
HCT: 36.5 % — ABNORMAL LOW (ref 39.0–52.0)
Hemoglobin: 6.8 g/dL — CL (ref 13.0–17.0)
Hemoglobin: 10.6 g/dL — ABNORMAL LOW (ref 13.0–17.0)
Hemoglobin: 8.3 g/dL — ABNORMAL LOW (ref 13.0–17.0)
Hemoglobin: 10.1 g/dL — ABNORMAL LOW (ref 13.0–17.0)
Hemoglobin: 12.6 g/dL — ABNORMAL LOW (ref 13.0–17.0)
MCH: 30.5 pg (ref 26.0–34.0)
MCH: 30 pg (ref 26.0–34.0)
MCH: 28.4 pg (ref 26.0–34.0)
MCH: 28.6 pg (ref 26.0–34.0)
MCH: 29.9 pg (ref 26.0–34.0)
MCHC: 33.3 g/dL (ref 30.0–36.0)
MCHC: 34.1 g/dL (ref 30.0–36.0)
MCHC: 32.3 g/dL (ref 30.0–36.0)
MCHC: 33.3 g/dL (ref 30.0–36.0)
MCHC: 34.5 g/dL (ref 30.0–36.0)
MCV: 91.5 fL (ref 80.0–100.0)
MCV: 88.1 fL (ref 80.0–100.0)
MCV: 88 fL (ref 80.0–100.0)
MCV: 85.8 fL (ref 80.0–100.0)
MCV: 86.7 fL (ref 80.0–100.0)
Platelets: 283 10*3/uL (ref 150–400)
Platelets: 127 10*3/uL — ABNORMAL LOW (ref 150–400)
Platelets: 52 10*3/uL — ABNORMAL LOW (ref 150–400)
Platelets: 50 10*3/uL — ABNORMAL LOW (ref 150–400)
RBC: 2.23 MIL/uL — ABNORMAL LOW (ref 4.22–5.81)
RBC: 3.53 MIL/uL — ABNORMAL LOW (ref 4.22–5.81)
RBC: 2.92 MIL/uL — ABNORMAL LOW (ref 4.22–5.81)
RBC: 3.53 MIL/uL — ABNORMAL LOW (ref 4.22–5.81)
RBC: 4.21 MIL/uL — ABNORMAL LOW (ref 4.22–5.81)
RDW: 14.3 % (ref 11.5–15.5)
RDW: 15 % (ref 11.5–15.5)
RDW: 15.6 % — ABNORMAL HIGH (ref 11.5–15.5)
RDW: 15.5 % (ref 11.5–15.5)
RDW: 14.9 % (ref 11.5–15.5)
WBC: 19.1 10*3/uL — ABNORMAL HIGH (ref 4.0–10.5)
WBC: 4.8 10*3/uL (ref 4.0–10.5)
WBC: 6.1 10*3/uL (ref 4.0–10.5)
WBC: 3.3 10*3/uL — ABNORMAL LOW (ref 4.0–10.5)
WBC: 4.8 10*3/uL (ref 4.0–10.5)
nRBC: 0.3 % — ABNORMAL HIGH (ref 0.0–0.2)
nRBC: 1 % — ABNORMAL HIGH (ref 0.0–0.2)
nRBC: 0.8 % — ABNORMAL HIGH (ref 0.0–0.2)
nRBC: 1.8 % — ABNORMAL HIGH (ref 0.0–0.2)
nRBC: 2.7 % — ABNORMAL HIGH (ref 0.0–0.2)

## 2020-09-23 LAB — POCT I-STAT, CHEM 8
BUN: 16 mg/dL (ref 6–20)
Calcium, Ion: 0.74 mmol/L — CL (ref 1.15–1.40)
Chloride: 110 mmol/L (ref 98–111)
Creatinine, Ser: 1.6 mg/dL — ABNORMAL HIGH (ref 0.61–1.24)
Glucose, Bld: 186 mg/dL — ABNORMAL HIGH (ref 70–99)
HCT: 23 % — ABNORMAL LOW (ref 39.0–52.0)
Hemoglobin: 7.8 g/dL — ABNORMAL LOW (ref 13.0–17.0)
Potassium: 4.4 mmol/L (ref 3.5–5.1)
Sodium: 151 mmol/L — ABNORMAL HIGH (ref 135–145)
TCO2: 22 mmol/L (ref 22–32)

## 2020-09-23 LAB — COMPREHENSIVE METABOLIC PANEL
ALT: 38 U/L (ref 0–44)
ALT: 101 U/L — ABNORMAL HIGH (ref 0–44)
ALT: 128 U/L — ABNORMAL HIGH (ref 0–44)
AST: 64 U/L — ABNORMAL HIGH (ref 15–41)
AST: 261 U/L — ABNORMAL HIGH (ref 15–41)
AST: 409 U/L — ABNORMAL HIGH (ref 15–41)
Albumin: 3 g/dL — ABNORMAL LOW (ref 3.5–5.0)
Albumin: 2.3 g/dL — ABNORMAL LOW (ref 3.5–5.0)
Albumin: 2.4 g/dL — ABNORMAL LOW (ref 3.5–5.0)
Alkaline Phosphatase: 34 U/L — ABNORMAL LOW (ref 38–126)
Alkaline Phosphatase: 34 U/L — ABNORMAL LOW (ref 38–126)
Alkaline Phosphatase: 34 U/L — ABNORMAL LOW (ref 38–126)
Anion gap: 21 — ABNORMAL HIGH (ref 5–15)
Anion gap: 17 — ABNORMAL HIGH (ref 5–15)
Anion gap: 17 — ABNORMAL HIGH (ref 5–15)
BUN: 19 mg/dL (ref 6–20)
BUN: 16 mg/dL (ref 6–20)
BUN: 19 mg/dL (ref 6–20)
CO2: 8 mmol/L — ABNORMAL LOW (ref 22–32)
CO2: 18 mmol/L — ABNORMAL LOW (ref 22–32)
CO2: 19 mmol/L — ABNORMAL LOW (ref 22–32)
Calcium: 7.5 mg/dL — ABNORMAL LOW (ref 8.9–10.3)
Calcium: 6.9 mg/dL — ABNORMAL LOW (ref 8.9–10.3)
Calcium: 8.6 mg/dL — ABNORMAL LOW (ref 8.9–10.3)
Chloride: 111 mmol/L (ref 98–111)
Chloride: 111 mmol/L (ref 98–111)
Chloride: 108 mmol/L (ref 98–111)
Creatinine, Ser: 2.53 mg/dL — ABNORMAL HIGH (ref 0.61–1.24)
Creatinine, Ser: 2.14 mg/dL — ABNORMAL HIGH (ref 0.61–1.24)
Creatinine, Ser: 2.32 mg/dL — ABNORMAL HIGH (ref 0.61–1.24)
GFR, Estimated: 30 mL/min — ABNORMAL LOW (ref >60)
GFR, Estimated: 37 mL/min — ABNORMAL LOW (ref >60)
GFR, Estimated: 34 mL/min — ABNORMAL LOW (ref >60)
Glucose, Bld: 327 mg/dL — ABNORMAL HIGH (ref 70–99)
Glucose, Bld: 206 mg/dL — ABNORMAL HIGH (ref 70–99)
Glucose, Bld: 134 mg/dL — ABNORMAL HIGH (ref 70–99)
Potassium: 5.2 mmol/L — ABNORMAL HIGH (ref 3.5–5.1)
Potassium: 5.2 mmol/L — ABNORMAL HIGH (ref 3.5–5.1)
Potassium: 4.6 mmol/L (ref 3.5–5.1)
Sodium: 140 mmol/L (ref 135–145)
Sodium: 146 mmol/L — ABNORMAL HIGH (ref 135–145)
Sodium: 144 mmol/L (ref 135–145)
Total Bilirubin: 1.1 mg/dL (ref 0.3–1.2)
Total Bilirubin: 1.6 mg/dL — ABNORMAL HIGH (ref 0.3–1.2)
Total Bilirubin: 2.4 mg/dL — ABNORMAL HIGH (ref 0.3–1.2)
Total Protein: 4.3 g/dL — ABNORMAL LOW (ref 6.5–8.1)
Total Protein: 4.3 g/dL — ABNORMAL LOW (ref 6.5–8.1)
Total Protein: 4.1 g/dL — ABNORMAL LOW (ref 6.5–8.1)

## 2020-09-23 LAB — BPAM RBC
Blood Product Expiration Date: 202203232359
Blood Product Expiration Date: 202203232359
ISSUE DATE / TIME: 202203140705
ISSUE DATE / TIME: 202203140705
Unit Type and Rh: 9500
Unit Type and Rh: 9500

## 2020-09-23 LAB — BASIC METABOLIC PANEL
Anion gap: 15 (ref 5–15)
BUN: 16 mg/dL (ref 6–20)
CO2: 16 mmol/L — ABNORMAL LOW (ref 22–32)
Calcium: 6.6 mg/dL — ABNORMAL LOW (ref 8.9–10.3)
Chloride: 115 mmol/L — ABNORMAL HIGH (ref 98–111)
Creatinine, Ser: 1.9 mg/dL — ABNORMAL HIGH (ref 0.61–1.24)
GFR, Estimated: 43 mL/min — ABNORMAL LOW (ref >60)
Glucose, Bld: 188 mg/dL — ABNORMAL HIGH (ref 70–99)
Potassium: 4.5 mmol/L (ref 3.5–5.1)
Sodium: 146 mmol/L — ABNORMAL HIGH (ref 135–145)

## 2020-09-23 LAB — GLUCOSE, CAPILLARY
Glucose-Capillary: 120 mg/dL — ABNORMAL HIGH (ref 70–99)
Glucose-Capillary: 178 mg/dL — ABNORMAL HIGH (ref 70–99)
Glucose-Capillary: 314 mg/dL — ABNORMAL HIGH (ref 70–99)
Glucose-Capillary: 370 mg/dL — ABNORMAL HIGH (ref 70–99)

## 2020-09-23 LAB — TYPE AND SCREEN
ABO/RH(D): O NEG
Antibody Screen: NEGATIVE
Unit division: 0
Unit division: 0

## 2020-09-23 LAB — BPAM FFP
Blood Product Expiration Date: 202203162359
Blood Product Expiration Date: 202203162359
Blood Product Expiration Date: 202203172359
Blood Product Expiration Date: 202203172359
ISSUE DATE / TIME: 202203142230
ISSUE DATE / TIME: 202203142230
ISSUE DATE / TIME: 202203150727
ISSUE DATE / TIME: 202203150727
Unit Type and Rh: 6200
Unit Type and Rh: 6200
Unit Type and Rh: 6200
Unit Type and Rh: 6200

## 2020-09-23 LAB — HEMOGLOBIN A1C
Hgb A1c MFr Bld: 5.2 % (ref 4.8–5.6)
Mean Plasma Glucose: 102.54 mg/dL

## 2020-09-23 LAB — DIC (DISSEMINATED INTRAVASCULAR COAGULATION)PANEL
D-Dimer, Quant: 2.81 ug/mL-FEU — ABNORMAL HIGH (ref 0.00–0.50)
D-Dimer, Quant: 0.9 ug/mL-FEU — ABNORMAL HIGH (ref 0.00–0.50)
D-Dimer, Quant: 0.69 ug/mL-FEU — ABNORMAL HIGH (ref 0.00–0.50)
D-Dimer, Quant: 0.35 ug/mL-FEU (ref 0.00–0.50)
Fibrinogen: 205 mg/dL — ABNORMAL LOW (ref 210–475)
Fibrinogen: 229 mg/dL (ref 210–475)
Fibrinogen: 342 mg/dL (ref 210–475)
Fibrinogen: 238 mg/dL (ref 210–475)
INR: 2.6 — ABNORMAL HIGH (ref 0.8–1.2)
INR: 1.6 — ABNORMAL HIGH (ref 0.8–1.2)
INR: 1.5 — ABNORMAL HIGH (ref 0.8–1.2)
INR: 0.9 (ref 0.8–1.2)
Platelets: 273 10*3/uL (ref 150–400)
Platelets: 122 10*3/uL — ABNORMAL LOW (ref 150–400)
Platelets: 52 10*3/uL — ABNORMAL LOW (ref 150–400)
Prothrombin Time: 26.6 seconds — ABNORMAL HIGH (ref 11.4–15.2)
Prothrombin Time: 18 seconds — ABNORMAL HIGH (ref 11.4–15.2)
Prothrombin Time: 17.1 seconds — ABNORMAL HIGH (ref 11.4–15.2)
Prothrombin Time: 11.9 seconds (ref 11.4–15.2)
Smear Review: NONE SEEN
Smear Review: NONE SEEN
Smear Review: NONE SEEN
Smear Review: NONE SEEN
aPTT: 50 seconds — ABNORMAL HIGH (ref 24–36)
aPTT: 40 seconds — ABNORMAL HIGH (ref 24–36)
aPTT: 37 seconds — ABNORMAL HIGH (ref 24–36)
aPTT: 35 seconds (ref 24–36)

## 2020-09-23 LAB — GLOBAL TEG PANEL
CFF Max Amplitude: 17.9 mm (ref 15–32)
CK with Heparinase (R): 6.2 min (ref 4.3–8.3)
Citrated Functional Fibrinogen: 326.6 mg/dL (ref 278–581)
Citrated Kaolin (K): 1.5 min (ref 0.8–2.1)
Citrated Kaolin (MA): 57.6 mm (ref 52–69)
Citrated Kaolin (R): 7.1 min (ref 4.6–9.1)
Citrated Kaolin Angle: 70.8 deg (ref 63–78)
Citrated Rapid TEG (MA): 55.6 mm (ref 52–70)

## 2020-09-23 LAB — TRAUMA TEG PANEL
CFF Max Amplitude: 17.2 mm (ref 15–32)
Citrated Kaolin (R): 6.7 min (ref 4.6–9.1)
Citrated Rapid TEG (MA): 47.9 mm — ABNORMAL LOW (ref 52–70)
Lysis at 30 Minutes: 0 % (ref 0.0–2.6)

## 2020-09-23 LAB — MAGNESIUM
Magnesium: 1.3 mg/dL — ABNORMAL LOW (ref 1.7–2.4)
Magnesium: 2.1 mg/dL (ref 1.7–2.4)

## 2020-09-23 LAB — MASSIVE TRANSFUSION PROTOCOL ORDER (BLOOD BANK NOTIFICATION)

## 2020-09-23 LAB — PREPARE RBC (CROSSMATCH)

## 2020-09-23 SURGERY — LAPAROTOMY, EXPLORATORY
Anesthesia: General | Site: Abdomen

## 2020-09-23 SURGERY — RADIOLOGY WITH ANESTHESIA
Anesthesia: General

## 2020-09-23 MED ORDER — EPINEPHRINE HCL 5 MG/250ML IV SOLN IN NS
INTRAVENOUS | Status: DC | PRN
  Administered 2020-09-23: 1 ug/min via INTRAVENOUS

## 2020-09-23 MED ORDER — FENTANYL CITRATE (PF) 100 MCG/2ML IJ SOLN
50.0000 ug | INTRAMUSCULAR | Status: DC | PRN
  Administered 2020-09-25: 100 ug via INTRAVENOUS
  Administered 2020-09-25: 25 ug via INTRAVENOUS
  Administered 2020-09-27 (×2): 50 ug via INTRAVENOUS
  Filled 2020-09-23: qty 2

## 2020-09-23 MED ORDER — ROCURONIUM BROMIDE 10 MG/ML (PF) SYRINGE
PREFILLED_SYRINGE | INTRAVENOUS | Status: DC | PRN
  Administered 2020-09-23: 50 mg via INTRAVENOUS

## 2020-09-23 MED ORDER — ALBUMIN HUMAN 5 % IV SOLN: INTRAVENOUS | Status: DC | PRN

## 2020-09-23 MED ORDER — SODIUM CHLORIDE 0.9% IV SOLUTION: Freq: Once | INTRAVENOUS | Status: DC

## 2020-09-23 MED ORDER — CHLORHEXIDINE GLUCONATE 0.12% ORAL RINSE (MEDLINE KIT)
15.0000 mL | Freq: Two times a day (BID) | OROMUCOSAL | Status: DC
  Administered 2020-09-23 – 2020-10-08 (×29): 15 mL via OROMUCOSAL

## 2020-09-23 MED ORDER — CEFAZOLIN SODIUM-DEXTROSE 2-3 GM-%(50ML) IV SOLR
INTRAVENOUS | Status: DC | PRN
  Administered 2020-09-23: 2 g via INTRAVENOUS

## 2020-09-23 MED ORDER — PROPOFOL 500 MG/50ML IV EMUL
INTRAVENOUS | Status: DC | PRN
  Administered 2020-09-23: 50 ug/kg/min via INTRAVENOUS

## 2020-09-23 MED ORDER — HEMOSTATIC AGENTS (NO CHARGE) OPTIME
TOPICAL | Status: DC | PRN
  Administered 2020-09-23: 1 via TOPICAL

## 2020-09-23 MED ORDER — MIDAZOLAM HCL 5 MG/5ML IJ SOLN
INTRAMUSCULAR | Status: DC | PRN
  Administered 2020-09-23: 2 mg via INTRAVENOUS

## 2020-09-23 MED ORDER — 0.9 % SODIUM CHLORIDE (POUR BTL) OPTIME
TOPICAL | Status: DC | PRN
  Administered 2020-09-23 (×2): 1000 mL

## 2020-09-23 MED ORDER — ORAL CARE MOUTH RINSE
15.0000 mL | OROMUCOSAL | Status: DC
  Administered 2020-09-23 – 2020-10-08 (×143): 15 mL via OROMUCOSAL

## 2020-09-23 MED ORDER — CALCIUM GLUCONATE-NACL 2-0.675 GM/100ML-% IV SOLN
2.0000 g | Freq: Once | INTRAVENOUS | Status: AC
  Administered 2020-09-23: 2000 mg via INTRAVENOUS
  Filled 2020-09-23: qty 100

## 2020-09-23 MED ORDER — SODIUM CHLORIDE 0.9 % IV SOLN: INTRAVENOUS | Status: DC | PRN

## 2020-09-23 MED ORDER — SODIUM CHLORIDE 0.9% IV SOLUTION: Freq: Once | INTRAVENOUS | Status: AC

## 2020-09-23 MED ORDER — EPINEPHRINE HCL 5 MG/250ML IV SOLN IN NS
INTRAVENOUS | Status: DC | PRN
  Administered 2020-09-23: 2 ug/min via INTRAVENOUS

## 2020-09-23 MED ORDER — MIDAZOLAM HCL 2 MG/2ML IJ SOLN
INTRAMUSCULAR | Status: DC | PRN
  Administered 2020-09-23: 2 mg via INTRAVENOUS

## 2020-09-23 MED ORDER — CEFAZOLIN SODIUM 1 G IJ SOLR
INTRAMUSCULAR | Status: AC
  Filled 2020-09-23: qty 40

## 2020-09-23 MED ORDER — LIDOCAINE 2% (20 MG/ML) 5 ML SYRINGE
INTRAMUSCULAR | Status: AC
  Filled 2020-09-23: qty 5

## 2020-09-23 MED ORDER — ROCURONIUM BROMIDE 10 MG/ML (PF) SYRINGE
PREFILLED_SYRINGE | INTRAVENOUS | Status: DC | PRN
  Administered 2020-09-23: 40 mg via INTRAVENOUS
  Administered 2020-09-23: 50 mg via INTRAVENOUS
  Administered 2020-09-23: 60 mg via INTRAVENOUS
  Administered 2020-09-23: 50 mg via INTRAVENOUS

## 2020-09-23 MED ORDER — POLYETHYLENE GLYCOL 3350 17 G PO PACK: 17.0000 g | PACK | Freq: Every day | ORAL | Status: DC

## 2020-09-23 MED ORDER — PHENOL 1.4 % MT LIQD
1.0000 | OROMUCOSAL | Status: DC | PRN
  Administered 2020-09-23: 1 via OROMUCOSAL
  Filled 2020-09-23: qty 177

## 2020-09-23 MED ORDER — MIDAZOLAM HCL 2 MG/2ML IJ SOLN
INTRAMUSCULAR | Status: AC
  Filled 2020-09-23: qty 2

## 2020-09-23 MED ORDER — MAGNESIUM SULFATE 2 GM/50ML IV SOLN: 2.0000 g | Freq: Once | INTRAVENOUS | Status: AC

## 2020-09-23 MED ORDER — PROPOFOL 1000 MG/100ML IV EMUL
INTRAVENOUS | Status: AC
  Administered 2020-09-24: 30 ug/kg/min via INTRAVENOUS
  Filled 2020-09-23: qty 100

## 2020-09-23 MED ORDER — GELATIN ABSORBABLE 12-7 MM EX MISC
CUTANEOUS | Status: AC
  Filled 2020-09-23: qty 2

## 2020-09-23 MED ORDER — ROCURONIUM BROMIDE 10 MG/ML (PF) SYRINGE
PREFILLED_SYRINGE | INTRAVENOUS | Status: DC | PRN
  Administered 2020-09-23 (×2): 50 mg via INTRAVENOUS

## 2020-09-23 MED ORDER — METRONIDAZOLE IN NACL 5-0.79 MG/ML-% IV SOLN
500.0000 mg | INTRAVENOUS | Status: DC
  Filled 2020-09-23: qty 100

## 2020-09-23 MED ORDER — EPHEDRINE SULFATE-NACL 50-0.9 MG/10ML-% IV SOSY
PREFILLED_SYRINGE | INTRAVENOUS | Status: DC | PRN
  Administered 2020-09-23: 10 mg via INTRAVENOUS

## 2020-09-23 MED ORDER — CALCIUM CHLORIDE 10 % IV SOLN
INTRAVENOUS | Status: DC | PRN
  Administered 2020-09-23: 1 g via INTRAVENOUS

## 2020-09-23 MED ORDER — MAGNESIUM SULFATE 2 GM/50ML IV SOLN
2.0000 g | Freq: Once | INTRAVENOUS | Status: AC
  Administered 2020-09-23: 2 g via INTRAVENOUS
  Filled 2020-09-23: qty 50

## 2020-09-23 MED ORDER — FENTANYL CITRATE (PF) 250 MCG/5ML IJ SOLN
INTRAMUSCULAR | Status: DC | PRN
  Administered 2020-09-23 (×2): 50 ug via INTRAVENOUS

## 2020-09-23 MED ORDER — ETOMIDATE 2 MG/ML IV SOLN
INTRAVENOUS | Status: AC
  Filled 2020-09-23: qty 10

## 2020-09-23 MED ORDER — HEMOSTATIC AGENTS (NO CHARGE) OPTIME
TOPICAL | Status: DC | PRN
  Administered 2020-09-23 (×2): 1 via TOPICAL

## 2020-09-23 MED ORDER — PROPOFOL 500 MG/50ML IV EMUL
INTRAVENOUS | Status: DC | PRN
  Administered 2020-09-23: 30 ug/kg/min via INTRAVENOUS

## 2020-09-23 MED ORDER — CALCIUM CHLORIDE 10 % IV SOLN
INTRAVENOUS | Status: DC | PRN
  Administered 2020-09-23: 50 mg via INTRAVENOUS
  Administered 2020-09-23: 100 mg via INTRAVENOUS

## 2020-09-23 MED ORDER — ALBUMIN HUMAN 5 % IV SOLN
INTRAVENOUS | Status: AC
  Filled 2020-09-23: qty 500

## 2020-09-23 MED ORDER — SODIUM CHLORIDE 0.9 % IV SOLN
4.0000 g | Freq: Once | INTRAVENOUS | Status: AC
  Administered 2020-09-23: 4 g via INTRAVENOUS
  Filled 2020-09-23: qty 40

## 2020-09-23 MED ORDER — ALBUMIN HUMAN 5 % IV SOLN
25.0000 g | Freq: Once | INTRAVENOUS | Status: AC
  Administered 2020-09-23: 25 g via INTRAVENOUS

## 2020-09-23 MED ORDER — ETOMIDATE 2 MG/ML IV SOLN
INTRAVENOUS | Status: DC | PRN
  Administered 2020-09-23: 20 mg via INTRAVENOUS

## 2020-09-23 MED ORDER — SODIUM BICARBONATE 8.4 % IV SOLN
INTRAVENOUS | Status: DC | PRN
  Administered 2020-09-23 (×5): 50 meq via INTRAVENOUS

## 2020-09-23 MED ORDER — FENTANYL CITRATE (PF) 250 MCG/5ML IJ SOLN
INTRAMUSCULAR | Status: DC | PRN
  Administered 2020-09-23: 150 ug via INTRAVENOUS

## 2020-09-23 MED ORDER — COAGULATION FACTOR VIIA RECOMB 1 MG IV SOLR
90.0000 ug/kg | Freq: Once | INTRAVENOUS | Status: AC
  Administered 2020-09-23: 9000 ug via INTRAVENOUS
  Filled 2020-09-23: qty 5

## 2020-09-23 MED ORDER — IOHEXOL 300 MG/ML  SOLN
150.0000 mL | Freq: Once | INTRAMUSCULAR | Status: AC | PRN
  Administered 2020-09-23: 14 mL via INTRA_ARTERIAL

## 2020-09-23 MED ORDER — LIDOCAINE HCL 1 % IJ SOLN
INTRAMUSCULAR | Status: AC
  Filled 2020-09-23: qty 20

## 2020-09-23 MED ORDER — TRANEXAMIC ACID-NACL 1000-0.7 MG/100ML-% IV SOLN
1000.0000 mg | Freq: Once | INTRAVENOUS | Status: AC
  Administered 2020-09-23: 1000 mg via INTRAVENOUS
  Filled 2020-09-23: qty 100

## 2020-09-23 MED ORDER — FENTANYL CITRATE (PF) 250 MCG/5ML IJ SOLN
INTRAMUSCULAR | Status: AC
  Filled 2020-09-23: qty 5

## 2020-09-23 MED ORDER — DOCUSATE SODIUM 50 MG/5ML PO LIQD: 100.0000 mg | Freq: Two times a day (BID) | ORAL | Status: DC

## 2020-09-23 MED ORDER — METRONIDAZOLE IN NACL 5-0.79 MG/ML-% IV SOLN
INTRAVENOUS | Status: DC | PRN
  Administered 2020-09-23: 500 mg via INTRAVENOUS

## 2020-09-23 MED ORDER — LACTATED RINGERS IV SOLN: INTRAVENOUS | Status: DC | PRN

## 2020-09-23 MED ORDER — SODIUM BICARBONATE 8.4 % IV SOLN: 100.0000 meq | Freq: Once | INTRAVENOUS | Status: AC

## 2020-09-23 MED ORDER — FENTANYL 2500MCG IN NS 250ML (10MCG/ML) PREMIX INFUSION
0.0000 ug/h | INTRAVENOUS | Status: DC
  Administered 2020-09-23: 50 ug/h via INTRAVENOUS
  Administered 2020-09-24 (×2): 200 ug/h via INTRAVENOUS
  Administered 2020-09-25: 150 ug/h via INTRAVENOUS
  Administered 2020-09-25: 200 ug/h via INTRAVENOUS
  Administered 2020-09-26: 300 ug/h via INTRAVENOUS
  Filled 2020-09-23 (×6): qty 250

## 2020-09-23 MED ORDER — ROCURONIUM BROMIDE 10 MG/ML (PF) SYRINGE
PREFILLED_SYRINGE | INTRAVENOUS | Status: AC
  Filled 2020-09-23: qty 10

## 2020-09-23 MED ORDER — CALCIUM CHLORIDE 10 % IV SOLN
INTRAVENOUS | Status: DC | PRN
  Administered 2020-09-23 (×4): 200 mg via INTRAVENOUS
  Administered 2020-09-23: 500 mg via INTRAVENOUS
  Administered 2020-09-23: 200 mg via INTRAVENOUS
  Administered 2020-09-23: 500 mg via INTRAVENOUS

## 2020-09-23 MED ORDER — IOHEXOL 300 MG/ML  SOLN
150.0000 mL | Freq: Once | INTRAMUSCULAR | Status: AC | PRN
  Administered 2020-09-23: 40 mL via INTRA_ARTERIAL

## 2020-09-23 MED ORDER — SODIUM BICARBONATE 8.4 % IV SOLN
INTRAVENOUS | Status: DC | PRN
  Administered 2020-09-23: 50 meq via INTRAVENOUS

## 2020-09-23 MED ORDER — PROPOFOL 10 MG/ML IV BOLUS
INTRAVENOUS | Status: AC
  Filled 2020-09-23: qty 20

## 2020-09-23 MED ORDER — MAGNESIUM SULFATE IN D5W 1-5 GM/100ML-% IV SOLN: 1.0000 g | Freq: Once | INTRAVENOUS | Status: DC

## 2020-09-23 MED ORDER — SUCCINYLCHOLINE CHLORIDE 200 MG/10ML IV SOSY
PREFILLED_SYRINGE | INTRAVENOUS | Status: DC | PRN
  Administered 2020-09-23: 140 mg via INTRAVENOUS

## 2020-09-23 MED ORDER — VASOPRESSIN 20 UNIT/ML IV SOLN
INTRAVENOUS | Status: AC
  Filled 2020-09-23: qty 1

## 2020-09-23 MED ORDER — MAGNESIUM SULFATE 2 GM/50ML IV SOLN
INTRAVENOUS | Status: AC
  Administered 2020-09-23: 2 g via INTRAVENOUS
  Filled 2020-09-23: qty 50

## 2020-09-23 MED ORDER — ONDANSETRON HCL 4 MG/2ML IJ SOLN
INTRAMUSCULAR | Status: AC
  Filled 2020-09-23: qty 2

## 2020-09-23 MED ORDER — SODIUM BICARBONATE 8.4 % IV SOLN
INTRAVENOUS | Status: AC
  Administered 2020-09-23: 100 meq via INTRAVENOUS
  Filled 2020-09-23: qty 100

## 2020-09-23 MED ORDER — SODIUM BICARBONATE 8.4 % IV SOLN
50.0000 meq | Freq: Once | INTRAVENOUS | Status: AC
  Administered 2020-09-23: 50 meq via INTRAVENOUS
  Filled 2020-09-23: qty 50

## 2020-09-23 MED ORDER — FENTANYL CITRATE (PF) 100 MCG/2ML IJ SOLN: 50.0000 ug | INTRAMUSCULAR | Status: DC | PRN

## 2020-09-23 MED FILL — Phenylephrine HCl IV Soln 10 MG/ML: INTRAVENOUS | Qty: 100 | Status: AC

## 2020-09-23 MED FILL — Sodium Chloride IV Soln 0.9%: INTRAVENOUS | Qty: 250 | Status: AC

## 2020-09-23 SURGICAL SUPPLY — 70 items
ADH SKN CLS APL DERMABOND .7 (GAUZE/BANDAGES/DRESSINGS)
AGENT HMST 10 BLLW SHRT CANN (HEMOSTASIS) ×1
APL PRP STRL LF DISP 70% ISPRP (MISCELLANEOUS) ×1
APL SKNCLS STERI-STRIP NONHPOA (GAUZE/BANDAGES/DRESSINGS) ×2
BAG BILE T-TUBES STRL (MISCELLANEOUS) ×1 IMPLANT
BAG DRN 9.5 2 ADJ BELT ADPR (MISCELLANEOUS) ×1
BENZOIN TINCTURE PRP APPL 2/3 (GAUZE/BANDAGES/DRESSINGS) ×2 IMPLANT
CANISTER SUCT 3000ML PPV (MISCELLANEOUS) ×2 IMPLANT
CANISTER WOUNDNEG PRESSURE 500 (CANNISTER) ×3 IMPLANT
CATH T TUBE WHEL MOSS 12FR (CATHETERS) ×1 IMPLANT
CHLORAPREP W/TINT 26 (MISCELLANEOUS) ×2 IMPLANT
COVER SURGICAL LIGHT HANDLE (MISCELLANEOUS) ×2 IMPLANT
DERMABOND ADVANCED (GAUZE/BANDAGES/DRESSINGS)
DERMABOND ADVANCED .7 DNX12 (GAUZE/BANDAGES/DRESSINGS) ×2 IMPLANT
DRAPE INCISE IOBAN 66X45 STRL (DRAPES) ×2 IMPLANT
DRAPE LAPAROSCOPIC ABDOMINAL (DRAPES) ×2 IMPLANT
DRAPE WARM FLUID 44X44 (DRAPES) ×2 IMPLANT
DRESSING QUICKCLOT CNTRL ZFOLD (GAUZE/BANDAGES/DRESSINGS) IMPLANT
DRSG OPSITE POSTOP 4X10 (GAUZE/BANDAGES/DRESSINGS) IMPLANT
DRSG OPSITE POSTOP 4X8 (GAUZE/BANDAGES/DRESSINGS) IMPLANT
DRSG QUICKCLOT CNTRL ZFOLD (GAUZE/BANDAGES/DRESSINGS) ×2
ELECT BLADE 4.0 EZ CLEAN MEGAD (MISCELLANEOUS) ×2
ELECT BLADE 6.5 EXT (BLADE) IMPLANT
ELECT CAUTERY BLADE 6.4 (BLADE) ×2 IMPLANT
ELECT REM PT RETURN 9FT ADLT (ELECTROSURGICAL) ×2
ELECTRODE BLDE 4.0 EZ CLN MEGD (MISCELLANEOUS) IMPLANT
ELECTRODE REM PT RTRN 9FT ADLT (ELECTROSURGICAL) ×1 IMPLANT
GAUZE SPONGE 4X4 12PLY STRL LF (GAUZE/BANDAGES/DRESSINGS) ×1 IMPLANT
GLOVE BIOGEL PI IND STRL 6 (GLOVE) ×1 IMPLANT
GLOVE BIOGEL PI INDICATOR 6 (GLOVE) ×1
GLOVE BIOGEL PI MICRO 5.5 (GLOVE) ×1
GLOVE BIOGEL PI MICRO STRL 5.5 (GLOVE) ×1 IMPLANT
GOWN STRL REUS W/ TWL LRG LVL3 (GOWN DISPOSABLE) ×2 IMPLANT
GOWN STRL REUS W/TWL LRG LVL3 (GOWN DISPOSABLE) ×4
HAND PENCIL TRP OPTION (MISCELLANEOUS) ×1 IMPLANT
HANDLE SUCTION POOLE (INSTRUMENTS) ×1 IMPLANT
HEMOSTAT HEMOBLAST BELLOWS (HEMOSTASIS) ×1 IMPLANT
KIT BASIN OR (CUSTOM PROCEDURE TRAY) ×2 IMPLANT
KIT OSTOMY DRAINABLE 2.75 STR (WOUND CARE) ×1 IMPLANT
KIT TURNOVER KIT B (KITS) ×2 IMPLANT
LIGASURE IMPACT 36 18CM CVD LR (INSTRUMENTS) IMPLANT
NS IRRIG 1000ML POUR BTL (IV SOLUTION) ×4 IMPLANT
PACK GENERAL/GYN (CUSTOM PROCEDURE TRAY) ×2 IMPLANT
PAD ARMBOARD 7.5X6 YLW CONV (MISCELLANEOUS) ×2 IMPLANT
PENCIL SMOKE EVACUATOR (MISCELLANEOUS) ×2 IMPLANT
SLEEVE SUCTION CATH 165 (SLEEVE) ×2 IMPLANT
SPECIMEN JAR LARGE (MISCELLANEOUS) IMPLANT
SPONGE ABDOMINAL VAC ABTHERA (MISCELLANEOUS) ×1 IMPLANT
SPONGE LAP 18X18 RF (DISPOSABLE) ×2 IMPLANT
STAPLER VISISTAT 35W (STAPLE) IMPLANT
SUCTION POOLE HANDLE (INSTRUMENTS) ×2
SUT ETHILON 2 0 FS 18 (SUTURE) ×1 IMPLANT
SUT ETHILON 3 0 FSL (SUTURE) ×1 IMPLANT
SUT MNCRL AB 4-0 PS2 18 (SUTURE) ×2 IMPLANT
SUT PDS AB 1 TP1 96 (SUTURE) ×2 IMPLANT
SUT PDS AB 4-0 RB1 27 (SUTURE) ×1 IMPLANT
SUT PROLENE 3 0 RB 1 (SUTURE) ×2 IMPLANT
SUT PROLENE 4 0 RB 1 (SUTURE) ×6
SUT PROLENE 4-0 RB1 .5 CRCL 36 (SUTURE) IMPLANT
SUT SILK 2 0 SH CR/8 (SUTURE) ×2 IMPLANT
SUT SILK 2 0 TIES 10X30 (SUTURE) ×3 IMPLANT
SUT SILK 2 0SH CR/8 30 (SUTURE) ×1 IMPLANT
SUT SILK 3 0 SH CR/8 (SUTURE) ×2 IMPLANT
SUT SILK 3 0 TIES 10X30 (SUTURE) ×2 IMPLANT
SUT VIC AB 3-0 SH 18 (SUTURE) IMPLANT
SUT VIC AB 3-0 SH 27 (SUTURE)
SUT VIC AB 3-0 SH 27XBRD (SUTURE) ×2 IMPLANT
TAPE CLOTH 4X10 WHT NS (GAUZE/BANDAGES/DRESSINGS) ×1 IMPLANT
TOWEL GREEN STERILE (TOWEL DISPOSABLE) ×2 IMPLANT
YANKAUER SUCT BULB TIP NO VENT (SUCTIONS) IMPLANT

## 2020-09-23 SURGICAL SUPPLY — 60 items
ADH SKN CLS APL DERMABOND .7 (GAUZE/BANDAGES/DRESSINGS)
APL PRP STRL LF DISP 70% ISPRP (MISCELLANEOUS) ×1
APL SKNCLS STERI-STRIP NONHPOA (GAUZE/BANDAGES/DRESSINGS) ×1
BAG BILE T-TUBES STRL (MISCELLANEOUS) ×1 IMPLANT
BAG DRN 9.5 2 ADJ BELT ADPR (MISCELLANEOUS) ×1
BENZOIN TINCTURE PRP APPL 2/3 (GAUZE/BANDAGES/DRESSINGS) ×1 IMPLANT
BLADE CLIPPER SURG (BLADE) IMPLANT
CANISTER SUCT 3000ML PPV (MISCELLANEOUS) ×2 IMPLANT
CHLORAPREP W/TINT 26 (MISCELLANEOUS) ×2 IMPLANT
COVER SURGICAL LIGHT HANDLE (MISCELLANEOUS) ×2 IMPLANT
COVER WAND RF STERILE (DRAPES) ×1 IMPLANT
DERMABOND ADVANCED (GAUZE/BANDAGES/DRESSINGS)
DERMABOND ADVANCED .7 DNX12 (GAUZE/BANDAGES/DRESSINGS) ×2 IMPLANT
DRAPE INCISE IOBAN 66X45 STRL (DRAPES) ×2 IMPLANT
DRAPE LAPAROSCOPIC ABDOMINAL (DRAPES) ×2 IMPLANT
DRAPE WARM FLUID 44X44 (DRAPES) ×2 IMPLANT
DRSG OPSITE POSTOP 4X10 (GAUZE/BANDAGES/DRESSINGS) IMPLANT
DRSG OPSITE POSTOP 4X8 (GAUZE/BANDAGES/DRESSINGS) IMPLANT
ELECT BLADE 6.5 EXT (BLADE) IMPLANT
ELECT CAUTERY BLADE 6.4 (BLADE) ×1 IMPLANT
ELECT REM PT RETURN 9FT ADLT (ELECTROSURGICAL) ×2
ELECTRODE REM PT RTRN 9FT ADLT (ELECTROSURGICAL) ×1 IMPLANT
GLOVE BIOGEL PI IND STRL 6 (GLOVE) ×1 IMPLANT
GLOVE BIOGEL PI INDICATOR 6 (GLOVE) ×1
GLOVE BIOGEL PI MICRO 5.5 (GLOVE) ×1
GLOVE BIOGEL PI MICRO STRL 5.5 (GLOVE) ×1 IMPLANT
GOWN STRL REUS W/ TWL LRG LVL3 (GOWN DISPOSABLE) ×2 IMPLANT
GOWN STRL REUS W/TWL LRG LVL3 (GOWN DISPOSABLE) ×4
HANDLE SUCTION POOLE (INSTRUMENTS) ×1 IMPLANT
KIT BASIN OR (CUSTOM PROCEDURE TRAY) ×2 IMPLANT
KIT OSTOMY DRAINABLE 2.75 STR (WOUND CARE) ×1 IMPLANT
KIT TURNOVER KIT B (KITS) ×2 IMPLANT
LIGASURE IMPACT 36 18CM CVD LR (INSTRUMENTS) IMPLANT
NS IRRIG 1000ML POUR BTL (IV SOLUTION) ×4 IMPLANT
PACK GENERAL/GYN (CUSTOM PROCEDURE TRAY) ×2 IMPLANT
PAD ARMBOARD 7.5X6 YLW CONV (MISCELLANEOUS) ×2 IMPLANT
PENCIL SMOKE EVACUATOR (MISCELLANEOUS) ×2 IMPLANT
SEALANT PATCH FIBRIN 2X4IN (MISCELLANEOUS) ×2 IMPLANT
SLEEVE SUCTION CATH 165 (SLEEVE) ×2 IMPLANT
SPECIMEN JAR LARGE (MISCELLANEOUS) IMPLANT
SPONGE ABD ABTHERA ADVANCE (MISCELLANEOUS) ×1 IMPLANT
SPONGE LAP 18X18 RF (DISPOSABLE) ×6 IMPLANT
STAPLER VISISTAT 35W (STAPLE) IMPLANT
SUCTION POOLE HANDLE (INSTRUMENTS) ×2
SUT MNCRL AB 4-0 PS2 18 (SUTURE) ×2 IMPLANT
SUT PDS AB 1 TP1 96 (SUTURE) ×2 IMPLANT
SUT PROLENE 3 0 RB 1 (SUTURE) ×1 IMPLANT
SUT PROLENE 4 0 RB 1 (SUTURE) ×2
SUT PROLENE 4-0 RB1 18X2 ARM (SUTURE) IMPLANT
SUT SILK 2 0 SH CR/8 (SUTURE) ×2 IMPLANT
SUT SILK 2 0 TIES 10X30 (SUTURE) ×2 IMPLANT
SUT SILK 2 0SH CR/8 30 (SUTURE) ×1 IMPLANT
SUT SILK 3 0 SH CR/8 (SUTURE) ×2 IMPLANT
SUT SILK 3 0 TIES 10X30 (SUTURE) ×2 IMPLANT
SUT SILK 3 0SH CR/8 30 (SUTURE) ×1 IMPLANT
SUT VIC AB 3-0 SH 18 (SUTURE) IMPLANT
SUT VIC AB 3-0 SH 27 (SUTURE)
SUT VIC AB 3-0 SH 27XBRD (SUTURE) ×2 IMPLANT
TOWEL GREEN STERILE (TOWEL DISPOSABLE) ×2 IMPLANT
YANKAUER SUCT BULB TIP NO VENT (SUCTIONS) ×1 IMPLANT

## 2020-09-23 NOTE — Sedation Documentation (Signed)
Pt arrived to IR with ICU RN. Anesthesia in room at bedside. Pt intubated and sedated. Consent signed. Pt transferred safely to IR table. Prepping for procedure.

## 2020-09-23 NOTE — Anesthesia Postprocedure Evaluation (Signed)
Anesthesia Post Note  Patient: [name]  Procedure(s) Performed: RADIOLOGY WITH ANESTHESIA (N/A )     Patient location during evaluation: SICU Anesthesia Type: General Level of consciousness: sedated Pain management: pain level controlled Vital Signs Assessment: post-procedure vital signs reviewed and stable Respiratory status: patient remains intubated per anesthesia plan Cardiovascular status: stable Postop Assessment: no apparent nausea or vomiting Anesthetic complications: no   No complications documented.  Last Vitals:  Vitals:   09/11/2020 1922 09/24/2020 2000  BP: 129/71   Pulse: 71   Resp: (!) 23   Temp:  (!) 36.3 C  SpO2: 100%     Last Pain:  Vitals:   09/10/2020 2000  TempSrc: Oral  PainSc:                  [name]

## 2020-09-23 NOTE — Transfer of Care (Signed)
Immediate Anesthesia Transfer of Care Note  Patient: [name]  Procedure(s) Performed: RE-EXPLORATORY LAPAROTOMY (N/A Abdomen) APPLICATION OF WOUND VAC (N/A Abdomen)  Patient Location: ICU  Anesthesia Type:General  Level of Consciousness: Patient remains intubated per anesthesia plan  Airway & Oxygen Therapy: Patient remains intubated per anesthesia plan and Patient placed on Ventilator (see vital sign flow sheet for setting)  Post-op Assessment: Report given to RN and Post -op Vital signs reviewed and stable  Post vital signs: Reviewed and stable  Last Vitals:  Vitals Value Taken Time  BP 115/72   Temp    Pulse 78 09/10/2020 1639  Resp 22 09/26/2020 1639  SpO2 98 % 09/17/2020 1639  Vitals shown include unvalidated device data.  Last Pain:  Vitals:   09/10/2020 1500  TempSrc: Oral  PainSc:          Complications: No complications documented.

## 2020-09-23 NOTE — Anesthesia Postprocedure Evaluation (Signed)
Anesthesia Post Note  Patient: [name]  Procedure(s) Performed: RE-EXPLORATORY LAPAROTOMY (N/A Abdomen) APPLICATION OF WOUND VAC (N/A Abdomen)     Patient location during evaluation: ICU Anesthesia Type: General Level of consciousness: patient remains intubated per anesthesia plan Pain management: pain level controlled Vital Signs Assessment: post-procedure vital signs reviewed and stable Respiratory status: patient remains intubated per anesthesia plan Cardiovascular status: stable Postop Assessment: no apparent nausea or vomiting Anesthetic complications: no   No complications documented.  Last Vitals:  Vitals:   09/13/2020 1515 09/17/2020 1635  BP:    Pulse: 72   Resp: (!) 22   Temp:    SpO2: 100% 100%    Last Pain:  Vitals:   10/01/2020 1500  TempSrc: Oral  PainSc:                  [name]

## 2020-09-23 NOTE — Sedation Documentation (Signed)
Pt remains stable, under the care of anesthesia. Procedure continues.

## 2020-09-23 NOTE — Progress Notes (Addendum)
PCCM Interval Progress Note  50yo M with neuroendocrine tumor POD1 whipple, hemicolectomy, SMV repair who decompensated overnight, developing hemorrhagic shock and is POD0 washout, wound vac application- requiring ongoing MTP.    Seen in follow up this afternoon. Had a brief period where pressor requirement decreased and bloody output slowed, but later required significant uptitration of pressors and had increased output. Wound vac being emptied about every 15-70min (documented 6.75L total drain output today)   Continues on 0.03 vaso, 32 levo.  Has received 29 PRBC 21 FFP 8 plt  6 cryo, 1g TXA, as well as LR, albumin, calcium, mag,  Bicarb. -- additional transfusions underway RR incr to 22 FiO2 to 40%  BP 88/62, HR 65 RR 22 SpO2 100% UOP  0.57ml/kg/hr CXR with interval development of bilateral densities  K 5.2 CO2 18 Cr 2.14 AG 17  Mildly elevated LFTs  hgb 8.3 plt 122  INR 1.6 Fibrinogen 229 around noon - follow ups remain pending  iCal 0.66., Mag 1.3  G- critically ill appearing M intubated sedated N- sedated P-Bilat Crackles, mechanically ventilated CV-rr cap refill <3 sec 1+ peripheral pulses GI- distended . wound vac with copious bloody output. Bloody output from JPs  GU- foley with clear yellow urine Ext-Extremity edema, no cyanosis  Skin - c/d/w   Hemorrhagic shock AKI, worse AGMA  Acute respiratory failure with hypoxia Hypomagnesemia Hypocalcemia P -d/w Dr. Zenia Resides -- plan for return OR for second look this afternoon -cont balanced MTP preceding return OR -will give 1x novoseven  -additional 4g calglu, 2g mag  -vaso, levo for SBP 90s  -If there is not a surgically corrective bleed, I worry that we are probably reaching the end of MTP utility given robust resuscitation outlined above + improving acidemia + improving temperature  -Will d/w CCS, pending OR findings   Updated family on critical status, at bedside  Guarded Prognosis    Additional Critical Care Time:  71min   Zenovia Justman MSN, AGACNP-BC [place] [phone] If no answer, [phone] 09/26/2020, 2:47 PM

## 2020-09-23 NOTE — Progress Notes (Signed)
PT Cancellation Note  Patient Details Name: [name] MRN: [id number] DOB: 1971/04/23   Cancelled Treatment:    Reason Eval/Treat Not Completed: Patient at procedure or test/unavailable (emergently returned to OR).  Wyona Almas, PT, DPT Acute Rehabilitation Services Pager [phone] Office [phone]    Deno Etienne 09/28/2020, 8:16 AM

## 2020-09-23 NOTE — Sedation Documentation (Signed)
Procedure started.

## 2020-09-23 NOTE — Transfer of Care (Addendum)
Immediate Anesthesia Transfer of Care Note  Patient: [name]  Procedure(s) Performed: REOPENING OF LAPAROTOMY ABDOMINAL WASHOUT (N/A Abdomen) APPLICATION OF WOUND VAC (N/A Abdomen)  Patient Location: ICU  Anesthesia Type:General  Level of Consciousness: sedated, unresponsive and Patient remains intubated per anesthesia plan  Airway & Oxygen Therapy: Patient remains intubated per anesthesia plan and Patient placed on Ventilator (see vital sign flow sheet for setting)  Post-op Assessment: Report given to RN and Post -op Vital signs reviewed and unstable, Anesthesiologist notified. Patient receiving MTP via [place].  Post vital signs: Reviewed and unstable  Last Vitals:  Vitals Value Taken Time  BP 135/85 09/09/2020 0955  Temp    Pulse 63 09/11/2020 0959  Resp 18 09/17/2020 0959  SpO2 100 % 09/17/2020 0959  Vitals shown include unvalidated device data.  Last Pain:  Vitals:   09/24/2020 0803  TempSrc:   PainSc: 10-Worst pain ever         Complications: No complications documented.

## 2020-09-23 NOTE — Sedation Documentation (Signed)
Procedure completed. Respiratory therapy called to assist with ventilator to transport pt back to ICU. Anesthesia CRNA Morey Hummingbird and ICU RN remain at bedside

## 2020-09-23 NOTE — Progress Notes (Signed)
Critical lab values   hgb  5.4 Ph  7 Bicarb 8  Also rising PT and INR  Values communicated to Dr. Barry Dienes. As well as continuous output from drains.   Orders given including 2 amp bicarb, multiple blood products.

## 2020-09-23 NOTE — Op Note (Addendum)
Date: 10/04/2020   Patient: [name] MRN: [id number]  Preoperative Diagnosis: Hemorrhagic shock Postoperative Diagnosis: Hemorrhagic shock, bile leak  Procedure: Reopening of recent laparotomy, abdominal washout, placement of biliary T tube, placement of negative pressure abdominal dressing  Surgeon: Michaelle Birks, MD Assistant: Lorine Bears, MD  EBL: >3L  Anesthesia: General  Specimens: None  Indications: Mr. Crochet is a 50 yo male with a neuroendocrine tumor at the root of the mesentery who underwent a Whipple and right hemicolectomy yesterday. Postoperatively he developed acidosis and significant drop in hemoglobin with bloody drain output, as well as hypotension. He was brought back to the OR emergently for re-exploration.  Findings: Hemoperitoneum. Small bile leak from choledochojejunal anastomosis. Bleeding from a small bowel mesenteric vessel, controlled with suture ligation. Diffuse oozing from raw surfaces on retroperitoneum consistent with coagulopathy.  Procedure details: Consent was obtained emergently from the patient and his brother prior to the procedure. The patient was brought to the operating room and placed on the table in the supine position. General anesthesia was induced and appropriate lines and drains were placed for intraoperative monitoring. The abdomen was prepped and draped in the usual sterile fashion. A pre-procedure timeout was taken verifying patient identity, surgical site and procedure to be performed.  The previous skin staples were removed, the fascial sutures were cut and the fascia was opened. There was massive hemoperitoneum on entry into the abdomen. Lap pads were placed in the RUQ and a Bookwalter fixed retractor was placed. The pancreaticobiliary limb was immediately identified and retracted medially to visualized the right retroperitoneum. There was clotted blood in this space, which was removed. There was diffuse oozing on the surface of the  retroperitoneum but no obvious single source of bleeding. The lateral aspect of the portal vein and porta hepatis were visualized, and there was no bleeding emanating from the porta. Quickclot packing was applied in the RUQ to the subhepatic space and manual pressure held. In the meantime the small bowel was run and the remainder of the abdomen was explored. It was diffusely edematous but viable. There was significant bleeding from a vessel on the cut edge of the distal small bowel mesentery, and this was controlled with 3-0 silk suture ligation. The remainder of the mesentery was hemostatic. There was no blood emanating from the SMV repair site or the GDA stump. A small amount of bile was noted to be leaking from the choledochojejunal anastomosis. Given the patient's hemodynamic instability and need for massive resuscitation I felt that he was at extraordinarily high risk of persistent bile leak and ongoing complications and opted to place a T tube to mitigate the bile leak. A 12-Fr T tube was brought onto the field, and 2 sutures were removed from the anterior row of the bile duct anastomosis. The T tube was cut to an appropriate length and a slit created, and then it was inserted through the anastomosis with the shorter end going into the bile duct and the longer end going into the jejunum. An additional 4-0 PDS suture was placed from the bile duct due to the jejunum to close the anastomosis around the T tube. The quickclot packing was removed from the RUQ. There was continued diffuse oozing from the retroperitoneal surface. There was also diffuse oozing noted from the subcutaneous tissue, consistent with coagulopathy. At this point the patient was hypothermic with persistent acidosis, and we felt that surgical bleeding had been addressed. The decision was made to pack the RUQ, leave the abdomen open  and return the patient to the ICU for further resuscitation. Hemoblast was sprayed in the RUQ and two lap pads  were placed. An Abthera negative pressure dressing was placed.   All counts were correct x2 at the end of the procedure. The patient was transported to the ICU intubated and in critical condition at the completion of the procedure.  Michaelle Birks, MD 10/04/2020  1:14 PM

## 2020-09-23 NOTE — Op Note (Signed)
Date: 10/08/2020  Patient: [name] MRN: [id number]  Preoperative Diagnosis: Hemorrhagic shock Postoperative Diagnosis: Same  Procedure: Reopening of recent laparotomy, abdominal washout, placement of negative pressure dressing  Surgeon: Michaelle Birks, MD Assistant: Lorine Bears MD, Georganna Skeans MD  EBL: 2L  Anesthesia: General  Specimens: None  Indications: [name] is a 50 yo male who underwent a Whipple yesterday for a neuroendocrine tumor and has had postoperative bleeding, for which he was re-explored this morning and found to have coagulopathic bleeding. He has been resuscitated throughout the day and coagulopathy and hypothermia has been corrected. He has had ongoing bloody drainage from his Abthera and surgical drains in spite of resuscitation and was brought back for re-exploration and Abthera change.  Findings: Hemoperitoneum. Jejunal mesenteric hematoma with diffuse oozing, controlled with an Everest patch. Significant small bowel edema and distension, making visualization of the retroperitoneum difficult. Continued oozing noted in the retroperitoneum without identification of a single site of significant bleeding. Negative pressure dressing replaced.  Procedure details: Informed consent was obtained from the patient's family prior to the procedure. The patient was brought to the operating room from the ICU and placed on the table in the supine position. Perioperative antibiotics were administered per SCIP guidelines. The abdomen was prepped and draped in the usual sterile fashion. A pre-procedure timeout was taken verifying patient identity, surgical site and procedure to be performed.  The previously placed Abthera was removed. There was diffuse clotted blood covering the small bowel, which was removed. There was underlying hemoperitoneum, which was evacuated with suction. Each quadrant of the abdomen was packed and then sequentially explored. Diffuse oozing was again noted.  Bleeding was identified from a mesenteric hematoma on the jejunum. The hematoma was evacuated and diffuse oozing was noted underneath on the mesentery. A sheet of Everest was placed and manual pressure held, with improvement in hemostasis. The RUQ was closely inspected blood appeared to be slowly welling in the right retroperitoneum, but no single source of bleeding was identified. Visualization was quite difficult due to massive small bowel edema. The previously placed JP drains were removed, to be replaced at a later time. The T tube and pancreatic stent were left in place. The abdomen was packed with a total of 11 laparotomy pads and an Abthera dressing was placed and applied to suction.  All counts were correct x2 at the end of the procedure. The patient was transported to the ICU in guarded condition at the completion of the procedure.  Michaelle Birks, MD 09/21/2020 7:01 PM

## 2020-09-23 NOTE — Anesthesia Procedure Notes (Signed)
Procedure Name: Intubation Date/Time: 09/23/2020 7:52 AM Performed by: Harden Mo, CRNA Pre-anesthesia Checklist: Patient identified, Emergency Drugs available, Suction available and Patient being monitored Patient Re-evaluated:Patient Re-evaluated prior to induction Oxygen Delivery Method: Circle System Utilized Preoxygenation: Pre-oxygenation with 100% oxygen Induction Type: IV induction, Rapid sequence and Cricoid Pressure applied Laryngoscope Size: Miller and 2 Grade View: Grade I Tube type: Oral Tube size: 8.0 mm Number of attempts: 1 Airway Equipment and Method: Stylet and Oral airway Placement Confirmation: ETT inserted through vocal cords under direct vision,  positive ETCO2 and breath sounds checked- equal and bilateral Secured at: 23 cm Tube secured with: Tape Dental Injury: Teeth and Oropharynx as per pre-operative assessment

## 2020-09-23 NOTE — Procedures (Signed)
Interventional Radiology Procedure Note  Procedure: Multi-selective visceral angiogram.  No evidence of arterial bleeding.   Complications: None  Estimated Blood Loss: None  Recommendations: - Continue to transfuse as needed - Right common femoral arteriotomy closed with Celt   Signed,  Criselda Peaches, MD

## 2020-09-23 NOTE — Sedation Documentation (Signed)
Pt remains stable. Under the care of anesthesia.

## 2020-09-23 NOTE — Progress Notes (Addendum)
PCCM Interval Progress Note  50yo M with neuroendocrine tumor POD1 whipple, hemicolectomy, SMV repair who decompensated overnight, developing hemorrhagic shock and is POD0 washout, wound vac, biliary t tube, and POD0  take back 3/15    Seen at bedside after take back 3/15. 5 prbc 5 ffp 1 plt in OR, has had significant decrease in pressor requirement following operative interventions (see details in ccs op note) (Epi at 1, vaso 0.03, NE 112) Currently putting out about 526ml blood from vac q61min   -IR for mesenteric arteriogram, possible embolization -- concern for possible posterior bleed not visible in OR  -Continue aggressive product resusc, will plan to repeat labs after IR (CBC CMP iCal Mag INR Fibrinogen ABG)  -Dc Epi, will trial dc vaso and continue NE. MAP 60-65. If there is incr pressor requirement, needs more aggressive transfusion. If add'l pressor support needed despite resuc, would add back vaso. Avoid neo.  -Wound vac to 125, do not decrease -- continue management of wound vac as per CCS  -re-eval after IR     [name] MSN, AGACNP-BC [place] [phone] If no answer, [phone] 09/10/2020, 5:19 PM

## 2020-09-23 NOTE — OR Nursing (Signed)
Two laparotomy sponges left in abdomen for intentional packing

## 2020-09-23 NOTE — Transfer of Care (Signed)
Immediate Anesthesia Transfer of Care Note  Patient: [name]  Procedure(s) Performed: RADIOLOGY WITH ANESTHESIA (N/A )  Patient Location: ICU  Anesthesia Type:General  Level of Consciousness: Patient remains intubated per anesthesia plan  Airway & Oxygen Therapy: Patient remains intubated per anesthesia plan and Patient placed on Ventilator (see vital sign flow sheet for setting)  Post-op Assessment: Report given to RN and Post -op Vital signs reviewed and stable  Post vital signs: Reviewed and stable  Last Vitals:  Vitals Value Taken Time  BP    Temp    Pulse 73 09/21/2020 1925  Resp 22 09/23/2020 1925  SpO2 100 % 09/20/2020 1925  Vitals shown include unvalidated device data.  Last Pain:  Vitals:   09/20/2020 1500  TempSrc: Oral  PainSc:          Complications: No complications documented.

## 2020-09-23 NOTE — Anesthesia Preprocedure Evaluation (Signed)
Anesthesia Evaluation  Patient identified by MRN, date of birth, ID band Patient unresponsive  General Assessment Comment:Hx Noted CGPreop documentation limited or incomplete due to emergent nature of procedure.  Airway Mallampati: Intubated       Dental   Pulmonary asthma , former smoker,     (-) decreased breath sounds  + intubated    Cardiovascular hypertension, + Cardiac Defibrillator  Rhythm:Regular Rate:Normal     Neuro/Psych    GI/Hepatic Hx noted CG   Endo/Other    Renal/GU Renal disease     Musculoskeletal   Abdominal   Peds  Hematology   Anesthesia Other Findings   Reproductive/Obstetrics                             Anesthesia Physical  Anesthesia Plan  ASA: IV and emergent  Anesthesia Plan: General   Post-op Pain Management:    Induction: Intravenous  PONV Risk Score and Plan: Treatment may vary due to age or medical condition and Ondansetron  Airway Management Planned: Oral ETT  Additional Equipment:   Intra-op Plan:   Post-operative Plan: Post-operative intubation/ventilation  Informed Consent:   Plan Discussed with: Anesthesiologist and CRNA  Anesthesia Plan Comments:         Anesthesia Quick Evaluation

## 2020-09-23 NOTE — Sedation Documentation (Signed)
Pt remains stable, under the care of anesthesia. Procedure finished.  Celt 59f to right groin

## 2020-09-23 NOTE — Progress Notes (Signed)
Transported pt from IR to 4N Rm 30. No complications noted

## 2020-09-23 NOTE — Progress Notes (Signed)
35 mL dilaudid from PCA pump wasted in Stericycle with Arcola Jansky RN

## 2020-09-23 NOTE — Progress Notes (Signed)
Patient was taken back to the OR this morning, bleeding from a mesenteric vessel was identified and controlled. There was also ongoing coagulopathic bleeding. Since then patient has had ongoing resuscitation with blood products and warming. He is now normothermic and pH is above 7.2. Coag parameters are close to normal. In spite of this correction, he continues to have high volume of sanguinous output from his drains and abthera, with persistent high-dose pressor requirement. Abdomen is increasingly distended. Since he has continued bleeding in spite of correction of acidosis, hypothermia and coagulopathy, we will return to the OR for washout, re-exploration and Abthera replacement. I have discussed with the patient's brother and wife that he is critically ill and prognosis is guarded at this point.  Michaelle Birks, JAARS Surgery General, Hepatobiliary and Pancreatic Surgery 09/28/2020 3:22 PM

## 2020-09-23 NOTE — OR Nursing (Signed)
Two laparotomy sponges removed from previous case. Replaced with 11 laparotomy sponges in abdomen for intentional packing.

## 2020-09-23 NOTE — Consult Note (Signed)
NAME:  [name], MRN:  [id number], DOB:  06/27/1971, LOS: 1 ADMISSION DATE:  09/20/2020, CONSULTATION DATE:  09/13/2020 REFERRING MD:  Anesthesia, CHIEF COMPLAINT:  Whipple, post-op shock   Brief History:  50 y.o. M with PMH of neuroendocrine tumor and underwent Whipple procedure 3/14 with intra-operative SMV laceration and repair by vascular with bovine pericardial patch.  Pt had worsening shock overnight and required three pressors despite massive transfusion protocol.  He was taken back to the OR for washout on 3/15 and returned to the ICU intubated   Past Medical History:   has a past medical history of AICD (automatic cardioverter/defibrillator) present, Dyslipidemia, Heart failure with mildly reduced EF, History of kidney stones, HTN (hypertension), ventricular fibrillation, and Nonischemic cardiomyopathy.   Significant Hospital Events:  3/14 Admit to Surgery, to OR for whipple, SMV laceration and repair, massive transfusion protocol overnight  3/15 Back to OR for washout, PCCM consult  Consults:  PCCM VVS  Procedures:  3/15 ETT  Significant Diagnostic Tests:  3/15 CXR>  Micro Data:  3/15 MRSA>>negative  Antimicrobials:  Ancef 3/14- Flagyl 3/14-  Interim History / Subjective:  Critically ill in shock with acidosis and open abdomen as above  Had significant bleeding overnight and went back to OR for washout + wound vac, MTP ongoing   Objective   Blood pressure (!) 98/53, pulse 78, temperature (!) 97.5 F (36.4 C), temperature source Axillary, resp. rate (!) 32, height 6\' 2"  (1.88 m), weight 95.3 kg, SpO2 100 %.    Vent Mode: PRVC FiO2 (%):  [21 %-100 %] 100 % Set Rate:  [18 bmp] 18 bmp Vt Set:  [650 mL] 650 mL PEEP:  [5 cmH20] 5 cmH20   Intake/Output Summary (Last 24 hours) at 10/02/2020 1041 Last data filed at 10/04/2020 1033 Gross per 24 hour  Intake 22142.15 ml  Output 10465 ml  Net 11677.15 ml   Filed Weights   09/21/2020 0639  Weight: 95.3 kg     Examination: General: Critically ill appearing middle aged M, intubated, sedated  HENT: NCAT ETT secure pink mmm  Lungs: Symmetrical chest expansion, diminished bibasilar sounds. Some crackles  Cardiovascular: RRR. 1+ periph pulses Cap refill brisk  Abdomen: Soft, + wound vac with bloody output  Extremities: No acute deformity, no cyanosis or clubbing.  Neuro: Sedated, chemical paralyzed at time of exam.   GU: Foley with yellow urine   Resolved Hospital Problem list     Assessment & Plan:   Acute Respiratory Failure with Hypoxia  -high risk for transfusion related complications- trali, ards  P -CXR, ABG -MV support -RASS -4 with open abdomen. Cont prop, will add fent gtt  -VAP, pulm hygiene  Hemorrhagic Shock Coagulopathy  -Had an SMA injury with his initial op on 3/14 from tumor location but stable post op. Decompensated 3/14-3/15 overnight w increased bleeding  -ongoing product resusc. Has received >20 products prior to ICU arrival -3/15 TEG series reviewed with Anesthesiologist -- agree, truly needs all factors at this point.  P -Continue MTP -- will recheck labs after 4th cryo completes to guide further resusc. D/w RN, need to catch up on plt  -Giving 1g TXA, add'l 2g calglu, 2g mag, bicarb -Follow CBC, coags, BMP + mag, ical  -goal SBPs 90s, MAPs 60. NE neo vaso -- wean as able  -strict I/O, hourly.  -Bair hugger   Neuroendocrine tumor, s/p whipple and R hemicolectomy c/b SMA injury s/p patch (3/14) Open Abdomen, + wound vac (3/15)  Bilious+  blood tinged OGT output  P -CCS primary -Monitor wound vac output   -LIMWS   AKI -in setting of hemorrhagic shock above. Is making urine still  P -repeat BMP pending -Foley, strict I/O   AGMA -in setting of hemorrhagic shock -resusc as above, trend -supplemental bicarb vs vent temporization as indicated when labs result   Hypocalcemia -replacing in context of MTP, trend- anticipate will need further correction    HFrEF NICM -s/p AICD placement  HTN P -holding home meds -supportive care, ICU monitoring  -following resuscitation, stabilization will readdress    Best practice (evaluated daily)  Diet: NPO Pain/Anxiety/Delirium protocol (if indicated): Propofol, Fentanyl VAP protocol (if indicated): HOB 30 degrees, suction prn DVT prophylaxis: SCD's GI prophylaxis: protonix Glucose control: SSI Mobility: bed rest Disposition: ICU  Goals of Care:  Last date of multidisciplinary goals of care discussion: per primary Family and staff present:  Summary of discussion:  Follow up goals of care discussion due:  Code Status:  Full Code  Labs   CBC: Recent Labs  Lab 09/16/20 1428 09/28/2020 0741 10/07/2020 1428 10/02/2020 1429 09/28/2020 1728 10/07/2020 2107 09/13/2020 2148 09/20/2020 0413 09/12/2020 0521 09/16/2020 0757 09/12/2020 0840 09/16/2020 0908 09/29/2020 0913 09/14/2020 1029  WBC 5.8  --  19.5*  --  20.4*  --   --  19.1*  --   --   --   --   --   --   HGB 15.2   < > 12.2*   < > 10.6*  --    < > 6.8*   < > 9.5* 11.6* 8.2* 7.8* 9.2*  HCT 44.4   < > 36.5*   < > 30.8*  --    < > 20.4*   < > 28.0* 34.0* 24.0* 23.0* 27.0*  MCV 84.4  --  87.3  --  86.5  --   --  91.5  --   --   --   --   --   --   PLT 293  --  398  --  423* 390  --  273  283  --   --   --   --   --   --    < > = values in this interval not displayed.    Basic Metabolic Panel: Recent Labs  Lab 09/16/20 1428 10/01/2020 0741 10/08/2020 1728 09/21/2020 2148 09/09/2020 0413 10/04/2020 0521 10/08/2020 0757 09/10/2020 0840 09/15/2020 0908 10/08/2020 0913 10/07/2020 1029  NA 139   < > 139   < > 140   < > 144 144 152* 151* 148*  K 3.9   < > 4.6   < > 5.2*   < > 5.1 6.4* 4.5 4.4 4.6  CL 106  --  111  --  111  --   --   --   --  110  --   CO2 23  --  18*  --  8*  --   --   --   --   --   --   GLUCOSE 108*  --  228*  --  327*  --   --   --   --  186*  --   BUN 11  --  15  --  19  --   --   --   --  16  --   CREATININE 1.17  --  1.28*  --  2.53*  --    --   --   --  1.60*  --  CALCIUM 9.2  --  7.6*  --  7.5*  --   --   --   --   --   --    < > = values in this interval not displayed.   GFR: Estimated Creatinine Clearance: 64.9 mL/min (A) (by C-G formula based on SCr of 1.6 mg/dL (H)). Recent Labs  Lab 09/16/20 1428 10/08/2020 1428 10/05/2020 1728 10/01/2020 0413  WBC 5.8 19.5* 20.4* 19.1*    Liver Function Tests: Recent Labs  Lab 09/23/2020 1728 10/08/2020 0413  AST 29 64*  ALT 16 38  ALKPHOS 34* 34*  BILITOT 1.5* 1.1  PROT 4.7* 4.3*  ALBUMIN 2.9* 3.0*   No results for input(s): LIPASE, AMYLASE in the last 168 hours. No results for input(s): AMMONIA in the last 168 hours.  ABG    Component Value Date/Time   PHART 7.220 (L) 10/04/2020 1029   PCO2ART 40.7 09/11/2020 1029   PO2ART 284 (H) 09/24/2020 1029   HCO3 16.9 (L) 09/25/2020 1029   TCO2 18 (L) 09/18/2020 1029   ACIDBASEDEF 10.0 (H) 09/10/2020 1029   O2SAT 100.0 09/20/2020 1029     Coagulation Profile: Recent Labs  Lab 09/17/2020 1438 10/02/2020 1728 09/21/2020 2107 10/06/2020 0413  INR 1.4* 1.4* 1.8* 2.6*    Cardiac Enzymes: No results for input(s): CKTOTAL, CKMB, CKMBINDEX, TROPONINI in the last 168 hours.  HbA1C: Hgb A1c MFr Bld  Date/Time Value Ref Range Status  10/06/2020 04:13 AM 5.2 4.8 - 5.6 % Final    Comment:    (NOTE) Pre diabetes:          5.7%-6.4%  Diabetes:              >6.4%  Glycemic control for   <7.0% adults with diabetes   11/16/2018 04:41 PM 5.6 4.8 - 5.6 % Final    Comment:             Prediabetes: 5.7 - 6.4          Diabetes: >6.4          Glycemic control for adults with diabetes: <7.0     CBG: Recent Labs  Lab 09/24/2020 2010 09/13/2020 0004 10/08/2020 0402  GLUCAP 194* 370* 314*    Review of Systems:   Unable to obtain - intubated sedated, chemically paralyzed   Past Medical History:  He,  has a past medical history of AICD (automatic cardioverter/defibrillator) present, Dyslipidemia, Heart failure with mildly reduced EF,  History of kidney stones, HTN (hypertension), ventricular fibrillation, and Nonischemic cardiomyopathy.   Surgical History:   Past Surgical History:  Procedure Laterality Date  . CARDIAC DEFIBRILLATOR PLACEMENT    . ERCP N/A 05/27/2020   Procedure: ENDOSCOPIC RETROGRADE CHOLANGIOPANCREATOGRAPHY (ERCP);  Surgeon: Clarene Essex, MD;  Location: [facility];  Service: Endoscopy;  Laterality: N/A;  . ESOPHAGOGASTRODUODENOSCOPY (EGD) WITH PROPOFOL N/A 06/25/2020   Procedure: ESOPHAGOGASTRODUODENOSCOPY (EGD) WITH PROPOFOL;  Surgeon: Arta Silence, MD;  Location: [facility];  Service: Endoscopy;  Laterality: N/A;  . FINE NEEDLE ASPIRATION N/A 06/25/2020   Procedure: FINE NEEDLE ASPIRATION (FNA) LINEAR;  Surgeon: Arta Silence, MD;  Location: [facility];  Service: Endoscopy;  Laterality: N/A;  . IR PERC CHOLECYSTOSTOMY  08/27/2020  . PACEMAKER IMPLANT    . REMOVAL OF STONES  05/27/2020   Procedure: REMOVAL OF STONES;  Surgeon: Clarene Essex, MD;  Location: [facility] ENDOSCOPY;  Service: Endoscopy;;  . repeat echo  2008    demonstrated near normalization  . SPHINCTEROTOMY  05/27/2020   Procedure: SPHINCTEROTOMY;  Surgeon: Clarene Essex, MD;  Location: [place];  Service: Endoscopy;;  . TRANSTHORACIC ECHOCARDIOGRAM  11/09/2005, 01/15/2009, 02/07/2010  . UPPER ESOPHAGEAL ENDOSCOPIC ULTRASOUND (EUS) N/A 06/25/2020   Procedure: UPPER ESOPHAGEAL ENDOSCOPIC ULTRASOUND (EUS);  Surgeon: Arta Silence, MD;  Location: [facility];  Service: Endoscopy;  Laterality: N/A;     Social History:   reports that he quit smoking about 22 years ago. His smoking use included cigarettes. He has a 0.50 pack-year smoking history. He has never used smokeless tobacco. He reports that he does not drink alcohol and does not use drugs.   Family History:  His family history includes Asthma in his maternal uncle; Diabetes in his father and maternal grandmother; Heart Problems in his father.   Allergies Allergies  Allergen  Reactions  . Ace Inhibitors Cough     Home Medications  Prior to Admission medications   Medication Sig Start Date End Date Taking? Authorizing Provider  acetaminophen (TYLENOL) 500 MG tablet Take 2 tablets (1,000 mg total) by mouth every 6 (six) hours. Patient taking differently: Take 1,000 mg by mouth every 6 (six) hours as needed for mild pain. 08/22/20  Yes [name], MD  allopurinol (ZYLOPRIM) 100 MG tablet Take one tablet by mouth daily for 1 week and then 2 tablets by mouth daily Patient taking differently: Take 200 mg by mouth daily. 07/03/20  Yes Swords, Darrick Penna, MD  amiodarone (PACERONE) 200 MG tablet Take 1 tablet (200 mg total) by mouth daily. Patient taking differently: Take 200 mg by mouth daily with supper. 07/30/20  Yes Evans Lance, MD  aspirin 81 MG tablet Take 1 tablet (81 mg total) by mouth 2 (two) times daily. Patient taking differently: Take 81 mg by mouth daily. 09/22/15  Yes Funches, Josalyn, MD  bisoprolol (ZEBETA) 10 MG tablet Take 1 tablet by mouth once daily Patient taking differently: Take 10 mg by mouth daily. 08/25/20  Yes Evans Lance, MD  budesonide-formoterol [facility]) 80-4.5 MCG/ACT inhaler Inhale 2 puffs into the lungs 2 (two) times daily. Must keep upcoming office visit for refills Patient taking differently: Inhale 2 puffs into the lungs 2 (two) times daily as needed (for flares). 11/09/19  Yes Fulp, Cammie, MD  sacubitril-valsartan (ENTRESTO) 24-26 MG Take 1 tablet by mouth 2 (two) times daily. 06/25/20  Yes Evans Lance, MD  oxyCODONE (OXY IR/ROXICODONE) 5 MG immediate release tablet Take 1-2 tablets (5-10 mg total) by mouth every 6 (six) hours as needed for severe pain. Patient not taking: Reported on 09/10/2020 08/22/20   [name], MD     Critical care time:  75min      CRITICAL CARE Performed by: Cristal Generous   Total critical care time: 94 minutes  Critical care time was exclusive of separately billable procedures and  treating other patients. Critical care was necessary to treat or prevent imminent or life-threatening deterioration.  Critical care was time spent personally by me on the following activities: development of treatment plan with patient and/or surrogate as well as nursing, discussions with consultants, evaluation of patient's response to treatment, examination of patient, obtaining history from patient or surrogate, ordering and performing treatments and interventions, ordering and review of laboratory studies, ordering and review of radiographic studies, pulse oximetry and re-evaluation of patient's condition.  [name] MSN, AGACNP-BC [place] [phone] If no answer, [phone] 09/19/2020, 11:55 AM

## 2020-09-23 NOTE — Anesthesia Preprocedure Evaluation (Signed)
Anesthesia Evaluation  Patient identified by MRN, date of birth, ID band Patient unresponsive    Reviewed: Allergy & Precautions, NPO status , Patient's Chart, lab work & pertinent test results, reviewed documented beta blocker date and time , Unable to perform ROS - Chart review onlyPreop documentation limited or incomplete due to emergent nature of procedure.  Airway Mallampati: Intubated       Dental   Pulmonary asthma , former smoker,    Pulmonary exam normal breath sounds clear to auscultation       Cardiovascular hypertension, Pt. on medications and Pt. on home beta blockers Normal cardiovascular exam+ Cardiac Defibrillator  Rhythm:Regular Rate:Normal  Echo 05/25/2020 1. Left ventricular ejection fraction, by estimation, is 45 to 50%. The left ventricle has mildly decreased function. The left ventricle demonstrates global hypokinesis. The left ventricular internal cavity size was mildly dilated. There is mild concentric left ventricular hypertrophy. Left ventricular diastolic parameters are consistent with Grade III diastolic dysfunction (restrictive). Elevated left ventricular end-diastolic pressure.  2. Right ventricular systolic function is normal. The right ventricular size is normal. There is normal pulmonary artery systolic pressure. The estimated right ventricular systolic pressure is 32.9 mmHg.  3. Left atrial size was moderately dilated.  4. The mitral valve is normal in structure. Trivial mitral valve regurgitation. No evidence of mitral stenosis.  5. The aortic valve is normal in structure. Aortic valve regurgitation is not visualized. Mild aortic valve sclerosis is present, with no evidence of aortic valve stenosis.  6. The inferior vena cava is normal in size with greater than 50% respiratory variability, suggesting right atrial pressure of 3 mmHg.    Neuro/Psych negative neurological ROS     GI/Hepatic negative  GI ROS, Neg liver ROS,   Endo/Other  negative endocrine ROS  Renal/GU Renal disease     Musculoskeletal negative musculoskeletal ROS (+)   Abdominal (+) - obese,   Peds  Hematology negative hematology ROS (+)   Anesthesia Other Findings   Reproductive/Obstetrics                             Anesthesia Physical  Anesthesia Plan  ASA: V and emergent  Anesthesia Plan: General   Post-op Pain Management: GA combined w/ Regional for post-op pain   Induction: Intravenous  PONV Risk Score and Plan: 4 or greater and Treatment may vary due to age or medical condition  Airway Management Planned: Oral ETT  Additional Equipment: Arterial line  Intra-op Plan:   Post-operative Plan: Post-operative intubation/ventilation  Informed Consent: I have reviewed the patients History and Physical, chart, labs and discussed the procedure including the risks, benefits and alternatives for the proposed anesthesia with the patient or authorized representative who has indicated his/her understanding and acceptance.     Dental advisory given  Plan Discussed with: CRNA  Anesthesia Plan Comments:         Anesthesia Quick Evaluation

## 2020-09-23 NOTE — Consult Note (Signed)
Chief Complaint: Patient was seen in consultation today for GI bleed/embolization.  Referring Physician(s): [name] ([place])  Supervising Physician: Jacqulynn Cadet  Patient Status: [facility] - In-pt  History of Present Illness: [name] is a 50 y.o. male with a past medical history of hypertension, dyslipidemia, nonischemic cardiomyopathy, history of vfib s/p AICD, HD, nephrolithiasis, and neuroendocrine tumor of pancreas. He was directly admitted to [facility] 09/15/2020 for management of neuroendocrine tumor of pancreas. He underwent whipple procedure with hemicolectomy in OR by Dr. Zenia Resides 10/07/2020. Overnight, patient in hemorrhagic shock despite multiple blood transfusions on pressors. He was taken back to OR for emergent ex lap, abdominal washout, biliary T tube placement, and placement of negative pressure abdominal dressing. Despite all efforts, patient continues to bleed and hemorrhagic shock persists.  IR consulted by Dr. Zenia Resides for possible image-guided mesenteric arteriogram with possible embolization. Patient laying in bed intubated with sedation, VSS on pressors.   Past Medical History:  Diagnosis Date  . AICD (automatic cardioverter/defibrillator) present   . Dyslipidemia   . Heart failure with mildly reduced EF    EF 35-40 in 2011 // Echo 06/2019: EF 45-50, Gr 2 DD, RAE, mild MR, mild TR, mild PI, RVSP 33.6  . History of kidney stones   . HTN (hypertension)   . Hx of ventricular fibrillation    Hx of VFib arrest s/p AICD placement  . Nonischemic cardiomyopathy     Past Surgical History:  Procedure Laterality Date  . CARDIAC DEFIBRILLATOR PLACEMENT    . CHOLECYSTECTOMY N/A 09/30/2020   Procedure: CHOLECYSTECTOMY;  Surgeon: [name], MD;  Location: [place];  Service: General;  Laterality: N/A;  . COLECTOMY N/A 09/21/2020   Procedure: RIGHT TOTAL COLECTOMY;  Surgeon: [name], MD;  Location: [place];  Service: General;  Laterality: N/A;  . COLOSTOMY   10/02/2020   Procedure: COLOSTOMY;  Surgeon: [name], MD;  Location: [place];  Service: General;;  . ERCP N/A 05/27/2020   Procedure: ENDOSCOPIC RETROGRADE CHOLANGIOPANCREATOGRAPHY (ERCP);  Surgeon: Clarene Essex, MD;  Location: [place];  Service: Endoscopy;  Laterality: N/A;  . ESOPHAGOGASTRODUODENOSCOPY (EGD) WITH PROPOFOL N/A 06/25/2020   Procedure: ESOPHAGOGASTRODUODENOSCOPY (EGD) WITH PROPOFOL;  Surgeon: Arta Silence, MD;  Location: [facility];  Service: Endoscopy;  Laterality: N/A;  . FINE NEEDLE ASPIRATION N/A 06/25/2020   Procedure: FINE NEEDLE ASPIRATION (FNA) LINEAR;  Surgeon: Arta Silence, MD;  Location: [facility];  Service: Endoscopy;  Laterality: N/A;  . IR PERC CHOLECYSTOSTOMY  08/27/2020  . PACEMAKER IMPLANT    . PATCH ANGIOPLASTY N/A 10/03/2020   Procedure: PATCH ANGIOPLASTY SUPERIOR MESENTERIC VEIN;  Surgeon: Elam Dutch, MD;  Location: [facility] OR;  Service: Vascular;  Laterality: N/A;  . REMOVAL OF STONES  05/27/2020   Procedure: REMOVAL OF STONES;  Surgeon: Clarene Essex, MD;  Location: [facility] ENDOSCOPY;  Service: Endoscopy;;  . repeat echo  2008    demonstrated near normalization  . SPHINCTEROTOMY  05/27/2020   Procedure: SPHINCTEROTOMY;  Surgeon: Clarene Essex, MD;  Location: [facility] ENDOSCOPY;  Service: Endoscopy;;  . TRANSTHORACIC ECHOCARDIOGRAM  11/09/2005, 01/15/2009, 02/07/2010  . UPPER ESOPHAGEAL ENDOSCOPIC ULTRASOUND (EUS) N/A 06/25/2020   Procedure: UPPER ESOPHAGEAL ENDOSCOPIC ULTRASOUND (EUS);  Surgeon: Arta Silence, MD;  Location: [facility];  Service: Endoscopy;  Laterality: N/A;  . WHIPPLE PROCEDURE N/A 09/21/2020   Procedure: WHIPPLE PROCEDURE;  Surgeon: [name], MD;  Location: [place];  Service: General;  Laterality: N/A;  ROOM 2 STARTING AT 07:30AM FOR 380 MIN  Allergies: Ace inhibitors  Medications: Prior to Admission medications   Medication Sig Start Date End Date Taking? Authorizing Provider  acetaminophen (TYLENOL) 500 MG tablet  Take 2 tablets (1,000 mg total) by mouth every 6 (six) hours. Patient taking differently: Take 1,000 mg by mouth every 6 (six) hours as needed for mild pain. 08/22/20  Yes [name], MD  allopurinol (ZYLOPRIM) 100 MG tablet Take one tablet by mouth daily for 1 week and then 2 tablets by mouth daily Patient taking differently: Take 200 mg by mouth daily. 07/03/20  Yes Swords, Darrick Penna, MD  amiodarone (PACERONE) 200 MG tablet Take 1 tablet (200 mg total) by mouth daily. Patient taking differently: Take 200 mg by mouth daily with supper. 07/30/20  Yes Evans Lance, MD  aspirin 81 MG tablet Take 1 tablet (81 mg total) by mouth 2 (two) times daily. Patient taking differently: Take 81 mg by mouth daily. 09/22/15  Yes Funches, Josalyn, MD  bisoprolol (ZEBETA) 10 MG tablet Take 1 tablet by mouth once daily Patient taking differently: Take 10 mg by mouth daily. 08/25/20  Yes Evans Lance, MD  budesonide-formoterol [facility]) 80-4.5 MCG/ACT inhaler Inhale 2 puffs into the lungs 2 (two) times daily. Must keep upcoming office visit for refills Patient taking differently: Inhale 2 puffs into the lungs 2 (two) times daily as needed (for flares). 11/09/19  Yes Fulp, Cammie, MD  sacubitril-valsartan (ENTRESTO) 24-26 MG Take 1 tablet by mouth 2 (two) times daily. 06/25/20  Yes Evans Lance, MD  oxyCODONE (OXY IR/ROXICODONE) 5 MG immediate release tablet Take 1-2 tablets (5-10 mg total) by mouth every 6 (six) hours as needed for severe pain. Patient not taking: Reported on 09/10/2020 08/22/20   [name], MD     Family History  Problem Relation Age of Onset  . Diabetes Father   . Heart Problems Father        unsure of what kind  . Asthma Maternal Uncle   . Diabetes Maternal Grandmother     Social History   Socioeconomic History  . Marital status: Married    Spouse name: Not on file  . Number of children: Not on file  . Years of education: Not on file  . Highest education level: Not on  file  Occupational History    Employer: [employer]  Tobacco Use  . Smoking status: Former Smoker    Packs/day: 0.25    Years: 2.00    Pack years: 0.50    Types: Cigarettes    Quit date: 07/12/1998    Years since quitting: 22.2  . Smokeless tobacco: Never Used  Vaping Use  . Vaping Use: Never used  Substance and Sexual Activity  . Alcohol use: No    Comment: Denies  . Drug use: No  . Sexual activity: Not on file  Other Topics Concern  . Not on file  Social History Narrative   The patient is married.  He is in native of [place],     living in [place] for 9 years.  He has a history of tobacco use,     but quit smoking a year ago.  Denies alcohol abuse.         Social Determinants of Health   Financial Resource Strain: Not on file  Food Insecurity: Not on file  Transportation Needs: Not on file  Physical Activity: Not on file  Stress: Not on file  Social Connections: Not on file     Review of  Systems: A 12 point ROS discussed and pertinent positives are indicated in the HPI above.  All other systems are negative.  Review of Systems  Unable to perform ROS: Intubated    Vital Signs: BP 110/78 (BP Location: Right Arm)   Pulse 72   Temp 97.7 F (36.5 C) (Oral)   Resp (!) 22   Ht 6\' 2"  (1.88 m)   Wt 210 lb (95.3 kg)   SpO2 100%   BMI 26.96 kg/m   Physical Exam Vitals and nursing note reviewed.  Constitutional:      General: He is not in acute distress.    Comments: Intubated with sedation. (+) warming blanket.  Cardiovascular:     Rate and Rhythm: Normal rate and regular rhythm.     Heart sounds: Normal heart sounds. No murmur heard.     Comments: On pressors. Pulmonary:     Effort: Pulmonary effort is normal. No respiratory distress.     Comments: Intubated with sedation. Abdominal:     Comments: (+) midline abdominal wound with negative pressure dressing.  Skin:    General: Skin is warm and dry.      MD Evaluation Airway: Other (comments)  (Intubated) Heart: WNL Abdomen: WNL Chest/ Lungs: WNL ASA  Classification: 3 Mallampati/Airway Score: Three (Intubated)   Imaging: IR Perc Cholecystostomy  Result Date: 08/27/2020 INDICATION: 50 year old male with acute calculus cholecystitis. Additionally, he has pancreatic neuroendocrine tumor in will be undergoing a Whipple procedure in the future including cholecystectomy. Request for percutaneous cholecystostomy tube now until patient suitable for Whipple and cholecystectomy together. EXAM: CHOLECYSTOSTOMY MEDICATIONS: In-patient receiving intravenous Rocephin. No additional antibiotic prophylaxis required. ANESTHESIA/SEDATION: Moderate (conscious) sedation was employed during this procedure. A total of Versed 1 mg and Fentanyl 25 mcg was administered intravenously. Moderate Sedation Time: 10 minutes. The patient's level of consciousness and vital signs were monitored continuously by radiology nursing throughout the procedure under my direct supervision. FLUOROSCOPY TIME:  Fluoroscopy Time: 0 minutes 36 seconds (5 mGy). COMPLICATIONS: None immediate. PROCEDURE: Informed written consent was obtained from the patient after a thorough discussion of the procedural risks, benefits and alternatives. All questions were addressed. Maximal Sterile Barrier Technique was utilized including caps, mask, sterile gowns, sterile gloves, sterile drape, hand hygiene and skin antiseptic. A timeout was performed prior to the initiation of the procedure. The right upper quadrant was interrogated with ultrasound. The inflamed and hydropic gallbladder was successfully identified. A suitable skin entry site was selected and marked. Local anesthesia was attained by infiltration with 1% lidocaine. Under real-time ultrasound guidance, a 21 gauge micropuncture needle was advanced along a short transhepatic tract and used to puncture the gallbladder lumen. A 0.018 wire was advanced into the gallbladder lumen. The Accustick  needle was removed. The Accustick transitional dilator was advanced into the gallbladder lumen. Gentle hand injections of contrast material were performed opacifying the gallbladder lumen. A 0.035 wire was advanced into the gallbladder lumen. The Accustick sheath was removed. The skin tract and transhepatic tract were dilated to 10 [place] and a 10 [place] all-purpose drainage catheter was advanced over the wire and formed. Aspiration yields purulent bile. A sample was sent for Gram stain and culture. A single fluoroscopic spot image was obtained and stored for the medical record. The catheter was connected to bag drainage and secured to the skin with 0 Prolene suture. The patient tolerated the procedure well. IMPRESSION: Successful placement of percutaneous transhepatic 10 French cholecystostomy tube for the treatment of calculus cholecystitis. Purulent appearing bile was  collected and submitted for culture. Electronically Signed   By: Jacqulynn Cadet M.D.   On: 08/27/2020 20:19   DG CHEST PORT 1 VIEW  Result Date: 09/19/2020 CLINICAL DATA:  Intubated EXAM: PORTABLE CHEST 1 VIEW COMPARISON:  09/18/2020 FINDINGS: Endotracheal tube tip 2 cm above the carina. Orogastric or nasogastric tube enters the abdomen. Pacemaker defibrillator remains in place. Right internal jugular central line tip in the SVC 3 cm above the right atrium. Mild cardiomegaly as seen previously. Increased perihilar lung density, more on the right than the left. This could be due to acute edema or aspiration. IMPRESSION: Increased perihilar lung density, more on the right than the left. This could be due to acute edema or aspiration. Lines and tubes well positioned. Electronically Signed   By: Nelson Chimes M.D.   On: 10/09/2020 12:25   DG CHEST PORT 1 VIEW  Result Date: 09/24/2020 CLINICAL DATA:  Central line placement, postop blood pool procedure. EXAM: PORTABLE CHEST 1 VIEW COMPARISON:  02/01/2014 FINDINGS: Right internal jugular sheath  tip projects over the brachiocephalic SVC confluence. No pneumothorax. Enteric tube in place with tip and side-port below the diaphragm in the stomach. Single lead left-sided pacemaker in place. Lower lung volumes. Normal heart size for technique. No pleural fluid or pulmonary edema. No focal airspace disease. IMPRESSION: 1. Right internal jugular sheath tip projects over the brachiocephalic SVC confluence. No pneumothorax. 2. Enteric tube with tip and side-port below the diaphragm in the stomach. Electronically Signed   By: Keith Rake M.D.   On: 10/06/2020 18:08    Labs:  CBC: Recent Labs    09/16/2020 1728 10/06/2020 2107 09/20/2020 0413 10/09/2020 0521 10/09/2020 1022 09/22/2020 1029 09/11/2020 1100 10/04/2020 1152 09/15/2020 1228  WBC 20.4*  --  19.1*  --  4.8  --   --   --  6.1  HGB 10.6*   < > 6.8*   < > 10.6* 9.2*  --  8.5* 8.3*  HCT 30.8*   < > 20.4*   < > 31.1* 27.0*  --  25.0* 25.7*  PLT 423*   < > 273  283  --  DCLMP  --  DCLMP  --  122*  127*   < > = values in this interval not displayed.    COAGS: Recent Labs    10/04/2020 2107 09/17/2020 0413 10/03/2020 1100 10/09/2020 1228  INR 1.8* 2.6* 1.6* 1.5*  APTT 33 50* 40* 37*    BMP: Recent Labs    12/03/19 1703 05/24/20 1956 07/03/20 1643 07/21/20 0921 08/21/20 1037 10/06/2020 1728 09/12/2020 2148 09/18/2020 0413 10/04/2020 0521 09/21/2020 0913 10/04/2020 1022 09/18/2020 1029 10/04/2020 1152 09/18/2020 1228  NA 144   < > 142 142   < > 139   < > 140   < > 151* 146* 148* 147* 146*  K 4.6   < > 3.7 4.2   < > 4.6   < > 5.2*   < > 4.4 4.5 4.6 5.0 5.2*  CL 109*   < > 103 104   < > 111  --  111  --  110 115*  --   --  111  CO2 21   < > 25 27   < > 18*  --  8*  --   --  16*  --   --  18*  GLUCOSE 103*   < > 105* 83   < > 228*  --  327*  --  186* 188*  --   --  206*  BUN 15   < > 18 16   < > 15  --  19  --  16 16  --   --  16  CALCIUM 9.5   < > 9.1 9.5   < > 7.6*  --  7.5*  --   --  6.6*  --   --  6.9*  CREATININE 1.27   < > 0.96 1.19   < >  1.28*  --  2.53*  --  1.60* 1.90*  --   --  2.14*  GFRNONAA 66   < > 92 71   < > >60  --  30*  --   --  43*  --   --  37*  GFRAA 77  --  107 82  --   --   --   --   --   --   --   --   --   --    < > = values in this interval not displayed.    LIVER FUNCTION TESTS: Recent Labs    08/26/20 1618 09/26/2020 1728 09/13/2020 0413 10/03/2020 1228  BILITOT 1.5* 1.5* 1.1 1.6*  AST 16 29 64* 261*  ALT 14 16 38 101*  ALKPHOS 69 34* 34* 34*  PROT 6.9 4.7* 4.3* 4.3*  ALBUMIN 2.4* 2.9* 3.0* 2.3*     Assessment and Plan:  Neuroendocrine tumor of pancreas s/p Whipple procedure with hemicolectomy in OR 10/09/2020 by Dr. Zenia Resides; complicated by hemorrhagic shock requiring pressors despite multiple blood transfusions s/p emergent ex lap with abdominal washout, biliary T tube placement, and placement of negative pressure abdominal dressing; with continued hemorrhagic shock. Plan for image-guided mesenteric arteriogram with possible embolization today in IR. This PA alongside Dr. Zenia Resides in [place] consult room to discuss plans for possible procedure.  Risks and benefits of mesenteric arteriogram with intervention were discussed with the patient including, but not limited to bleeding, infection, vascular injury, contrast induced renal failure, stroke, reperfusion hemorrhage, or even death. This interventional procedure involves the use of X-rays and because of the nature of the planned procedure, it is possible that we will have prolonged use of X-ray fluoroscopy. Potential radiation risks to you include (but are not limited to) the following: - A slightly elevated risk for cancer  several years later in life. This risk is typically less than 0.5% percent. This risk is low in comparison to the normal incidence of human cancer, which is 33% for women and 50% for men according to the [employer]. - Radiation induced injury can include skin redness, resembling a rash, tissue breakdown / ulcers and hair loss  (which can be temporary or permanent).  The likelihood of either of these occurring depends on the difficulty of the procedure and whether you are sensitive to radiation due to previous procedures, disease, or genetic conditions.  IF your procedure requires a prolonged use of radiation, you will be notified and given written instructions for further action.  It is your responsibility to monitor the irradiated area for the 2 weeks following the procedure and to notify your physician if you are concerned that you have suffered a radiation induced injury.   All of the patient's family's questions were answered, they is agreeable to proceed. Consent signed by patient's brother- signed and in IR control room.   Thank you for this interesting consult.  I greatly enjoyed meeting Mihran A Tomerlin and look forward to participating in their care.  A copy of this  report was sent to the requesting provider on this date.  Electronically Signed: Earley Abide, PA-C 10/08/2020, 5:11 PM   I spent a total of 40 Minutes in face to face in clinical consultation, greater than 50% of which was counseling/coordinating care for GI bleed/embolization.

## 2020-09-23 NOTE — Anesthesia Postprocedure Evaluation (Signed)
Anesthesia Post Note  Patient: [name]  Procedure(s) Performed: WHIPPLE PROCEDURE (N/A Abdomen) CHOLECYSTECTOMY (N/A Abdomen) RIGHT TOTAL COLECTOMY (N/A Abdomen) COLOSTOMY PATCH ANGIOPLASTY SUPERIOR MESENTERIC VEIN (N/A Abdomen)     Patient location during evaluation: PACU Anesthesia Type: General Level of consciousness: sedated and patient cooperative Pain management: pain level controlled Vital Signs Assessment: vitals unstable Respiratory status: spontaneous breathing Cardiovascular status: unstable Anesthetic complications: no Comments: Returning to OR for bleeding   No complications documented.  Last Vitals:  Vitals:   09/13/2020 0629 09/13/2020 0634  BP:    Pulse: 79 78  Resp: (!) 34 (!) 26  Temp:    SpO2: 100% 100%    Last Pain:  Vitals:   09/09/2020 0317  TempSrc:   PainSc: [place]

## 2020-09-23 NOTE — Progress Notes (Signed)
PCCM Update:  - Ordered 2 units of platelets based on CBC at 10pm and MA <50 on TEG at 8pm - Patient continues to have high output via abdominal wound vac. Will continue to monitor closely. - PT/INR, PTT in better range at 8pm lab check  Freda Jackson, MD [place] Office: [phone]   See Amion for Pager Details

## 2020-09-23 NOTE — Anesthesia Postprocedure Evaluation (Signed)
Anesthesia Post Note  Patient: [name]  Procedure(s) Performed: REOPENING OF LAPAROTOMY ABDOMINAL WASHOUT (N/A Abdomen) APPLICATION OF WOUND VAC (N/A Abdomen)     Patient location during evaluation: PACU Anesthesia Type: General Level of consciousness: sedated and patient remains intubated per anesthesia plan Pain management: pain level controlled Vital Signs Assessment: vitals unstable Respiratory status: patient on ventilator - see flowsheet for VS and patient remains intubated per anesthesia plan Cardiovascular status: unstable Anesthetic complications: no   No complications documented. Today's Vitals   09/13/2020 0803 10/03/2020 0951 09/10/2020 1042 09/19/2020 1043  BP:      Pulse:      Resp: (!) 32     Temp:      TempSrc:      SpO2: 98% 100% 100% 100%  Weight:      Height:      PainSc: 10-Worst pain ever        Nolon Nations

## 2020-09-23 NOTE — Anesthesia Preprocedure Evaluation (Addendum)
Anesthesia Evaluation  Patient identified by MRN, date of birth, ID band Patient unresponsive  General Assessment Comment:Hx Noted CGPreop documentation limited or incomplete due to emergent nature of procedure.  Airway Mallampati: Intubated       Dental   Pulmonary asthma , former smoker,     (-) decreased breath sounds  + intubated    Cardiovascular hypertension, + Cardiac Defibrillator  Rhythm:Regular Rate:Normal     Neuro/Psych    GI/Hepatic Hx noted CG   Endo/Other    Renal/GU Renal disease     Musculoskeletal   Abdominal   Peds  Hematology   Anesthesia Other Findings   Reproductive/Obstetrics                            Anesthesia Physical Anesthesia Plan  ASA: IV and emergent  Anesthesia Plan: General   Post-op Pain Management:    Induction: Intravenous  PONV Risk Score and Plan: Treatment may vary due to age or medical condition and Ondansetron  Airway Management Planned: Oral ETT  Additional Equipment:   Intra-op Plan:   Post-operative Plan: Post-operative intubation/ventilation  Informed Consent:   Plan Discussed with: Anesthesiologist  Anesthesia Plan Comments:         Anesthesia Quick Evaluation

## 2020-09-23 NOTE — Progress Notes (Signed)
Patient has had >1L bloody drainage from JP drains overnight. In shock on 3 pressors at max dose with profound metabolic acidosis. Received 3 u PRBCS as well as FFP and cryo overnight with continued decrease in hgb to 5.6 this morning. Transfusing 4u PRBCs and 4 FFP this morning via [place] infuser. He is awake and oriented. Will return to OR emergently for reopening of laparotomy to identify source of bleeding. Discussed with both the patient and is brother. Will likely return to ICU intubated postoperatively.  Michaelle Birks, MD [facility] Surgery General, Hepatobiliary and Pancreatic Surgery 09/10/2020 7:12 AM

## 2020-09-24 ENCOUNTER — Encounter (HOSPITAL_COMMUNITY): Payer: Self-pay | Admitting: Radiology

## 2020-09-24 ENCOUNTER — Inpatient Hospital Stay (HOSPITAL_COMMUNITY): Payer: Managed Care, Other (non HMO)

## 2020-09-24 ENCOUNTER — Ambulatory Visit: Payer: Managed Care, Other (non HMO) | Admitting: Family Medicine

## 2020-09-24 DIAGNOSIS — R571 Hypovolemic shock: Secondary | ICD-10-CM | POA: Diagnosis not present

## 2020-09-24 DIAGNOSIS — J9601 Acute respiratory failure with hypoxia: Secondary | ICD-10-CM | POA: Diagnosis not present

## 2020-09-24 LAB — BPAM PLATELET PHERESIS
Blood Product Expiration Date: 202203152359
Blood Product Expiration Date: 202203172359
Blood Product Expiration Date: 202203172359
Blood Product Expiration Date: 202203172359
Blood Product Expiration Date: 202203172359
Blood Product Expiration Date: 202203182359
Blood Product Expiration Date: 202203172359
Blood Product Expiration Date: 202203182359
ISSUE DATE / TIME: 202203150730
ISSUE DATE / TIME: 202203150927
ISSUE DATE / TIME: 202203151109
ISSUE DATE / TIME: 202203151256
ISSUE DATE / TIME: 202203151518
ISSUE DATE / TIME: 202203152328
ISSUE DATE / TIME: 202203151711
ISSUE DATE / TIME: 202203152204
Unit Type and Rh: 5100
Unit Type and Rh: 6200
Unit Type and Rh: 6200
Unit Type and Rh: 6200
Unit Type and Rh: 6200
Unit Type and Rh: 7300
Unit Type and Rh: 5100
Unit Type and Rh: 6200

## 2020-09-24 LAB — BPAM RBC
Blood Product Expiration Date: 202203182359
Blood Product Expiration Date: 202203202359
Blood Product Expiration Date: 202203212359
Blood Product Expiration Date: 202203212359
Blood Product Expiration Date: 202204112359
Blood Product Expiration Date: 202204122359
Blood Product Expiration Date: 202204122359
Blood Product Expiration Date: 202204132359
Blood Product Expiration Date: 202204142359
Blood Product Expiration Date: 202204142359
Blood Product Expiration Date: 202204142359
Blood Product Expiration Date: 202204142359
Blood Product Expiration Date: 202204142359
Blood Product Expiration Date: 202204142359
Blood Product Expiration Date: 202204142359
ISSUE DATE / TIME: 202203142134
ISSUE DATE / TIME: 202203142229
ISSUE DATE / TIME: 202203142229
ISSUE DATE / TIME: 202203150620
ISSUE DATE / TIME: 202203150620
ISSUE DATE / TIME: 202203150620
ISSUE DATE / TIME: 202203150620
ISSUE DATE / TIME: 202203150758
ISSUE DATE / TIME: 202203150758
ISSUE DATE / TIME: 202203150758
ISSUE DATE / TIME: 202203150758
ISSUE DATE / TIME: 202203150808
ISSUE DATE / TIME: 202203150808
ISSUE DATE / TIME: 202203150808
ISSUE DATE / TIME: 202203150808
Unit Type and Rh: 5100
Unit Type and Rh: 5100
Unit Type and Rh: 5100
Unit Type and Rh: 5100
Unit Type and Rh: 5100
Unit Type and Rh: 5100
Unit Type and Rh: 5100
Unit Type and Rh: 5100
Unit Type and Rh: 5100
Unit Type and Rh: 5100
Unit Type and Rh: 5100
Unit Type and Rh: 5100
Unit Type and Rh: 9500
Unit Type and Rh: 9500
Unit Type and Rh: 9500

## 2020-09-24 LAB — TYPE AND SCREEN
ABO/RH(D): O NEG
Antibody Screen: NEGATIVE
Unit division: 0
Unit division: 0
Unit division: 0
Unit division: 0
Unit division: 0
Unit division: 0
Unit division: 0
Unit division: 0
Unit division: 0
Unit division: 0
Unit division: 0
Unit division: 0
Unit division: 0
Unit division: 0
Unit division: 0

## 2020-09-24 LAB — COMPREHENSIVE METABOLIC PANEL
ALT: 104 U/L — ABNORMAL HIGH (ref 0–44)
ALT: 96 U/L — ABNORMAL HIGH (ref 0–44)
AST: 369 U/L — ABNORMAL HIGH (ref 15–41)
AST: 290 U/L — ABNORMAL HIGH (ref 15–41)
Albumin: 2.5 g/dL — ABNORMAL LOW (ref 3.5–5.0)
Albumin: 2.5 g/dL — ABNORMAL LOW (ref 3.5–5.0)
Alkaline Phosphatase: 40 U/L (ref 38–126)
Alkaline Phosphatase: 46 U/L (ref 38–126)
Anion gap: 15 (ref 5–15)
Anion gap: 13 (ref 5–15)
BUN: 22 mg/dL — ABNORMAL HIGH (ref 6–20)
BUN: 25 mg/dL — ABNORMAL HIGH (ref 6–20)
CO2: 24 mmol/L (ref 22–32)
CO2: 27 mmol/L (ref 22–32)
Calcium: 8 mg/dL — ABNORMAL LOW (ref 8.9–10.3)
Calcium: 8.2 mg/dL — ABNORMAL LOW (ref 8.9–10.3)
Chloride: 107 mmol/L (ref 98–111)
Chloride: 106 mmol/L (ref 98–111)
Creatinine, Ser: 2.61 mg/dL — ABNORMAL HIGH (ref 0.61–1.24)
Creatinine, Ser: 2.7 mg/dL — ABNORMAL HIGH (ref 0.61–1.24)
GFR, Estimated: 29 mL/min — ABNORMAL LOW (ref >60)
GFR, Estimated: 28 mL/min — ABNORMAL LOW (ref >60)
Glucose, Bld: 101 mg/dL — ABNORMAL HIGH (ref 70–99)
Glucose, Bld: 89 mg/dL (ref 70–99)
Potassium: 3.9 mmol/L (ref 3.5–5.1)
Potassium: 3.7 mmol/L (ref 3.5–5.1)
Sodium: 146 mmol/L — ABNORMAL HIGH (ref 135–145)
Sodium: 146 mmol/L — ABNORMAL HIGH (ref 135–145)
Total Bilirubin: 2.5 mg/dL — ABNORMAL HIGH (ref 0.3–1.2)
Total Bilirubin: 2.7 mg/dL — ABNORMAL HIGH (ref 0.3–1.2)
Total Protein: 4.4 g/dL — ABNORMAL LOW (ref 6.5–8.1)
Total Protein: 4.6 g/dL — ABNORMAL LOW (ref 6.5–8.1)

## 2020-09-24 LAB — PREPARE PLATELET PHERESIS
Unit division: 0
Unit division: 0
Unit division: 0
Unit division: 0
Unit division: 0
Unit division: 0
Unit division: 0
Unit division: 0

## 2020-09-24 LAB — PREPARE CRYOPRECIPITATE
Unit division: 0
Unit division: 0
Unit division: 0
Unit division: 0
Unit division: 0
Unit division: 0
Unit division: 0
Unit division: 0
Unit division: 0

## 2020-09-24 LAB — BPAM CRYOPRECIPITATE
Blood Product Expiration Date: 202203150559
Blood Product Expiration Date: 202203151217
Blood Product Expiration Date: 202203151439
Blood Product Expiration Date: 202203151640
Blood Product Expiration Date: 202203151640
Blood Product Expiration Date: 202203151716
Blood Product Expiration Date: 202203151845
Blood Product Expiration Date: 202203152113
Blood Product Expiration Date: 202203160337
ISSUE DATE / TIME: 202203150031
ISSUE DATE / TIME: 202203150728
ISSUE DATE / TIME: 202203150922
ISSUE DATE / TIME: 202203151109
ISSUE DATE / TIME: 202203151109
ISSUE DATE / TIME: 202203151256
ISSUE DATE / TIME: 202203151519
ISSUE DATE / TIME: 202203151715
ISSUE DATE / TIME: 202203152203
Unit Type and Rh: 5100
Unit Type and Rh: 5100
Unit Type and Rh: 5100
Unit Type and Rh: 5100
Unit Type and Rh: 5100
Unit Type and Rh: 5100
Unit Type and Rh: 5100
Unit Type and Rh: 6200
Unit Type and Rh: 6200

## 2020-09-24 LAB — CBC
HCT: 30.4 % — ABNORMAL LOW (ref 39.0–52.0)
HCT: 32.1 % — ABNORMAL LOW (ref 39.0–52.0)
HCT: 33 % — ABNORMAL LOW (ref 39.0–52.0)
HCT: 35.7 % — ABNORMAL LOW (ref 39.0–52.0)
Hemoglobin: 11 g/dL — ABNORMAL LOW (ref 13.0–17.0)
Hemoglobin: 11.5 g/dL — ABNORMAL LOW (ref 13.0–17.0)
Hemoglobin: 11.7 g/dL — ABNORMAL LOW (ref 13.0–17.0)
Hemoglobin: 12.6 g/dL — ABNORMAL LOW (ref 13.0–17.0)
MCH: 29.8 pg (ref 26.0–34.0)
MCH: 29.9 pg (ref 26.0–34.0)
MCH: 30.1 pg (ref 26.0–34.0)
MCH: 30.5 pg (ref 26.0–34.0)
MCHC: 35.3 g/dL (ref 30.0–36.0)
MCHC: 35.5 g/dL (ref 30.0–36.0)
MCHC: 35.8 g/dL (ref 30.0–36.0)
MCHC: 36.2 g/dL — ABNORMAL HIGH (ref 30.0–36.0)
MCV: 83.3 fL (ref 80.0–100.0)
MCV: 83.6 fL (ref 80.0–100.0)
MCV: 84.2 fL (ref 80.0–100.0)
MCV: 86.4 fL (ref 80.0–100.0)
Platelets: 50 10*3/uL — ABNORMAL LOW (ref 150–400)
Platelets: 68 10*3/uL — ABNORMAL LOW (ref 150–400)
Platelets: 69 10*3/uL — ABNORMAL LOW (ref 150–400)
Platelets: 69 10*3/uL — ABNORMAL LOW (ref 150–400)
RBC: 3.65 MIL/uL — ABNORMAL LOW (ref 4.22–5.81)
RBC: 3.84 MIL/uL — ABNORMAL LOW (ref 4.22–5.81)
RBC: 3.92 MIL/uL — ABNORMAL LOW (ref 4.22–5.81)
RBC: 4.13 MIL/uL — ABNORMAL LOW (ref 4.22–5.81)
RDW: 14.8 % (ref 11.5–15.5)
RDW: 14.8 % (ref 11.5–15.5)
RDW: 15.2 % (ref 11.5–15.5)
RDW: 15.3 % (ref 11.5–15.5)
WBC: 2.3 10*3/uL — ABNORMAL LOW (ref 4.0–10.5)
WBC: 3.3 10*3/uL — ABNORMAL LOW (ref 4.0–10.5)
WBC: 3.9 10*3/uL — ABNORMAL LOW (ref 4.0–10.5)
WBC: 4.2 10*3/uL (ref 4.0–10.5)
nRBC: 3.3 % — ABNORMAL HIGH (ref 0.0–0.2)
nRBC: 3.3 % — ABNORMAL HIGH (ref 0.0–0.2)
nRBC: 3.9 % — ABNORMAL HIGH (ref 0.0–0.2)
nRBC: 6.6 % — ABNORMAL HIGH (ref 0.0–0.2)

## 2020-09-24 LAB — GLUCOSE, CAPILLARY
Glucose-Capillary: 120 mg/dL — ABNORMAL HIGH (ref 70–99)
Glucose-Capillary: 61 mg/dL — ABNORMAL LOW (ref 70–99)
Glucose-Capillary: 73 mg/dL (ref 70–99)
Glucose-Capillary: 75 mg/dL (ref 70–99)
Glucose-Capillary: 77 mg/dL (ref 70–99)
Glucose-Capillary: 77 mg/dL (ref 70–99)
Glucose-Capillary: 95 mg/dL (ref 70–99)
Glucose-Capillary: 96 mg/dL (ref 70–99)

## 2020-09-24 LAB — POCT I-STAT 7, (LYTES, BLD GAS, ICA,H+H)
Acid-Base Excess: 5 mmol/L — ABNORMAL HIGH (ref 0.0–2.0)
Acid-base deficit: 18 mmol/L — ABNORMAL HIGH (ref 0.0–2.0)
Bicarbonate: 12.6 mmol/L — ABNORMAL LOW (ref 20.0–28.0)
Bicarbonate: 26.8 mmol/L (ref 20.0–28.0)
Calcium, Ion: 1.02 mmol/L — ABNORMAL LOW (ref 1.15–1.40)
Calcium, Ion: 0.87 mmol/L — CL (ref 1.15–1.40)
HCT: 34 % — ABNORMAL LOW (ref 39.0–52.0)
HCT: 27 % — ABNORMAL LOW (ref 39.0–52.0)
Hemoglobin: 11.6 g/dL — ABNORMAL LOW (ref 13.0–17.0)
Hemoglobin: 9.2 g/dL — ABNORMAL LOW (ref 13.0–17.0)
O2 Saturation: 100 %
O2 Saturation: 97 %
Patient temperature: 35.7
Patient temperature: 36.8
Potassium: 6.4 mmol/L (ref 3.5–5.1)
Potassium: 3.8 mmol/L (ref 3.5–5.1)
Sodium: 144 mmol/L (ref 135–145)
Sodium: 148 mmol/L — ABNORMAL HIGH (ref 135–145)
TCO2: 14 mmol/L — ABNORMAL LOW (ref 22–32)
TCO2: 28 mmol/L (ref 22–32)
pCO2 arterial: 48.9 mmHg — ABNORMAL HIGH (ref 32.0–48.0)
pCO2 arterial: 30 mmHg — ABNORMAL LOW (ref 32.0–48.0)
pH, Arterial: 7.011 — CL (ref 7.350–7.450)
pH, Arterial: 7.558 — ABNORMAL HIGH (ref 7.350–7.450)
pO2, Arterial: 406 mmHg — ABNORMAL HIGH (ref 83.0–108.0)
pO2, Arterial: 72 mmHg — ABNORMAL LOW (ref 83.0–108.0)

## 2020-09-24 LAB — BPAM FFP
Blood Product Expiration Date: 202203192359
ISSUE DATE / TIME: 202203150622
Unit Type and Rh: 5100

## 2020-09-24 LAB — FIBRINOGEN: Fibrinogen: 339 mg/dL (ref 210–475)

## 2020-09-24 LAB — TRAUMA TEG PANEL
CFF Max Amplitude: 24 mm (ref 15–32)
CFF Max Amplitude: 30.9 mm (ref 15–32)
Citrated Kaolin (R): 5.9 min (ref 4.6–9.1)
Citrated Kaolin (R): 5.7 min (ref 4.6–9.1)
Citrated Rapid TEG (MA): 55.4 mm (ref 52–70)
Citrated Rapid TEG (MA): 61.1 mm (ref 52–70)
Lysis at 30 Minutes: 0 % (ref 0.0–2.6)
Lysis at 30 Minutes: 0 % (ref 0.0–2.6)

## 2020-09-24 LAB — PREPARE FRESH FROZEN PLASMA

## 2020-09-24 LAB — PROTIME-INR
INR: 1.2 (ref 0.8–1.2)
Prothrombin Time: 14.6 seconds (ref 11.4–15.2)

## 2020-09-24 LAB — BASIC METABOLIC PANEL
Anion gap: 17 — ABNORMAL HIGH (ref 5–15)
BUN: 20 mg/dL (ref 6–20)
CO2: 20 mmol/L — ABNORMAL LOW (ref 22–32)
Calcium: 7.9 mg/dL — ABNORMAL LOW (ref 8.9–10.3)
Chloride: 108 mmol/L (ref 98–111)
Creatinine, Ser: 2.49 mg/dL — ABNORMAL HIGH (ref 0.61–1.24)
GFR, Estimated: 31 mL/min — ABNORMAL LOW (ref >60)
Glucose, Bld: 120 mg/dL — ABNORMAL HIGH (ref 70–99)
Potassium: 4.3 mmol/L (ref 3.5–5.1)
Sodium: 145 mmol/L (ref 135–145)

## 2020-09-24 LAB — PATHOLOGIST SMEAR REVIEW

## 2020-09-24 LAB — DIC (DISSEMINATED INTRAVASCULAR COAGULATION)PANEL
D-Dimer, Quant: 1.39 ug/mL-FEU — ABNORMAL HIGH (ref 0.00–0.50)
Fibrinogen: 440 mg/dL (ref 210–475)
INR: 1.4 — ABNORMAL HIGH (ref 0.8–1.2)
Platelets: 69 10*3/uL — ABNORMAL LOW (ref 150–400)
Prothrombin Time: 16.8 seconds — ABNORMAL HIGH (ref 11.4–15.2)
Smear Review: NONE SEEN
aPTT: 32 seconds (ref 24–36)

## 2020-09-24 LAB — MAGNESIUM
Magnesium: 1.8 mg/dL (ref 1.7–2.4)
Magnesium: 1.9 mg/dL (ref 1.7–2.4)
Magnesium: 2 mg/dL (ref 1.7–2.4)

## 2020-09-24 LAB — CALCIUM, IONIZED: Calcium, Ionized, Serum: 4 mg/dL — ABNORMAL LOW (ref 4.5–5.6)

## 2020-09-24 MED ORDER — EPINEPHRINE HCL 5 MG/250ML IV SOLN IN NS
0.0000 ug/min | INTRAVENOUS | Status: DC
  Administered 2020-09-24: 0.5 ug/min via INTRAVENOUS
  Administered 2020-09-25: 8 ug/min via INTRAVENOUS
  Administered 2020-09-25: 5 ug/min via INTRAVENOUS
  Administered 2020-09-26: 7 ug/min via INTRAVENOUS
  Administered 2020-09-26: 6 ug/min via INTRAVENOUS
  Administered 2020-09-26: 6.75 ug/min via INTRAVENOUS
  Administered 2020-09-27: 15 ug/min via INTRAVENOUS
  Administered 2020-09-27: 5 ug/min via INTRAVENOUS
  Administered 2020-09-28: 10 ug/min via INTRAVENOUS
  Administered 2020-09-28: 12 ug/min via INTRAVENOUS
  Administered 2020-09-28 (×4): 20 ug/min via INTRAVENOUS
  Administered 2020-09-29: 19 ug/min via INTRAVENOUS
  Administered 2020-09-29 (×3): 20 ug/min via INTRAVENOUS
  Administered 2020-09-29: 5.333 ug/min via INTRAVENOUS
  Administered 2020-09-30: 11 ug/min via INTRAVENOUS
  Administered 2020-09-30: 18 ug/min via INTRAVENOUS
  Administered 2020-09-30: 5 ug/min via INTRAVENOUS
  Administered 2020-09-30: 19 ug/min via INTRAVENOUS
  Administered 2020-10-01: 4 ug/min via INTRAVENOUS
  Filled 2020-09-24 (×17): qty 250
  Filled 2020-09-24: qty 500
  Filled 2020-09-24 (×5): qty 250

## 2020-09-24 MED ORDER — SODIUM CHLORIDE 0.9% IV SOLUTION: Freq: Once | INTRAVENOUS | Status: AC

## 2020-09-24 MED ORDER — MAGNESIUM SULFATE 2 GM/50ML IV SOLN
2.0000 g | Freq: Once | INTRAVENOUS | Status: AC
  Administered 2020-09-24: 2 g via INTRAVENOUS
  Filled 2020-09-24: qty 50

## 2020-09-24 MED ORDER — SODIUM BICARBONATE 8.4 % IV SOLN
INTRAVENOUS | Status: AC
  Filled 2020-09-24: qty 50

## 2020-09-24 MED ORDER — SODIUM CHLORIDE 0.9% IV SOLUTION: Freq: Once | INTRAVENOUS | Status: DC

## 2020-09-24 MED ORDER — ATROPINE SULFATE 1 MG/10ML IJ SOSY
PREFILLED_SYRINGE | INTRAMUSCULAR | Status: AC
  Filled 2020-09-24: qty 10

## 2020-09-24 MED ORDER — PROPOFOL 1000 MG/100ML IV EMUL
5.0000 ug/kg/min | INTRAVENOUS | Status: DC
  Administered 2020-09-24: 30 ug/kg/min via INTRAVENOUS
  Administered 2020-09-24 (×2): 40 ug/kg/min via INTRAVENOUS
  Administered 2020-09-25: 30 ug/kg/min via INTRAVENOUS
  Filled 2020-09-24 (×4): qty 100

## 2020-09-24 MED ORDER — PROPOFOL 1000 MG/100ML IV EMUL
INTRAVENOUS | Status: AC
  Filled 2020-09-24: qty 100

## 2020-09-24 MED ORDER — VITAMIN K1 10 MG/ML IJ SOLN
10.0000 mg | Freq: Once | INTRAVENOUS | Status: AC
  Administered 2020-09-24: 10 mg via INTRAVENOUS
  Filled 2020-09-24: qty 1

## 2020-09-24 MED ORDER — SODIUM CHLORIDE 0.9 % IV SOLN
4.0000 g | Freq: Once | INTRAVENOUS | Status: AC
  Administered 2020-09-24: 4 g via INTRAVENOUS
  Filled 2020-09-24: qty 40

## 2020-09-24 MED ORDER — DEXTROSE 50 % IV SOLN
INTRAVENOUS | Status: AC
  Administered 2020-09-24: 12.5 g via INTRAVENOUS
  Filled 2020-09-24: qty 50

## 2020-09-24 MED ORDER — DEXTROSE 50 % IV SOLN: 12.5000 g | Freq: Once | INTRAVENOUS | Status: AC

## 2020-09-24 MED ORDER — PANTOPRAZOLE SODIUM 40 MG IV SOLR
40.0000 mg | Freq: Two times a day (BID) | INTRAVENOUS | Status: DC
  Administered 2020-09-24 – 2020-11-23 (×121): 40 mg via INTRAVENOUS
  Filled 2020-09-24 (×121): qty 40

## 2020-09-24 NOTE — Progress Notes (Signed)
PT Cancellation Note  Patient Details Name: [name] MRN: [id number] DOB: Apr 02, 1971   Cancelled Treatment:    Reason Eval/Treat Not Completed: Medical issues which prohibited therapy;Patient not medically ready. Pt on rapid infusion and not appropriate for PT session at this time per RN due to blood loss. Will follow-up on a later date once medically appropriate.   [name], PT, DPT Acute Rehabilitation Services  Pager: [phone] Office: [place] 09/24/2020, 8:29 AM

## 2020-09-24 NOTE — Progress Notes (Signed)
PHARMACY - TOTAL PARENTERAL NUTRITION CONSULT NOTE  Indication:  Expected prolonged ileus  Patient Measurements: Height: 6\' 2"  (188 cm) Weight: 95.3 kg (210 lb) IBW/kg (Calculated) : 82.2 TPN AdjBW (KG): 95.3 Body mass index is 26.96 kg/m.  Assessment:  85 YOM with history of mass adjacent to head of the pancreas in 2021.  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  Developed cholecystitis in Feb 2022 and had percutaneous cholecystectomy tube placement.  Presented this admission for neuroendocrine tumor resection with Whipple procedure, also had right total colectomy, colostomy and cholecystectomy on 10/09/2020.  SMV was lacerated during procedure and repaired by bovine pericardial patch.  Developed hemorrhagic shock with bile leak post-op and underwent placement of biliary T tube with abdominal washout on 3/15 PM.  Pharmacy consulted to manage TPN.  Glucose / Insulin: no hx DM, A1c 5.2% - CBGs low normal Electrolytes: Na 146, low iCa, others WNL Renal: SCr 1.17 > 2.7, BUN 25 Hepatic: AST/ALT down 290/96, tbili 2.7, albumin 2.5 Intake / Output; MIVF: UOP 0.7 ml/kg/hr, NG 136mL, , drain 13.5L, EBL 11.6L in past 2 days, net +2L GI Imaging: none since TPN start GI Surgeries / Procedures: none since TPN start  Central access: CVC double lumen placed 10/08/2020 TPN start date: 09/26/19  Nutritional Goals (RD rec pending):  Current Nutrition:  NPO Propofol at 30 mcg/kg/min  Plan:  Start TPN 3/17 as consult received after hours Standard TPN labs and nursing care orders  [name] D. Mina Marble, PharmD, BCPS, [place] 09/24/2020, 3:01 PM

## 2020-09-24 NOTE — Progress Notes (Signed)
NAME:  [name], MRN:  [id number], DOB:  26-Nov-1970, LOS: 2 ADMISSION DATE:  09/12/2020, CONSULTATION DATE:  09/24/20 REFERRING MD:  Anesthesia, CHIEF COMPLAINT:  Whipple, post-op shock   Brief History:  50 y.o. M with PMH of neuroendocrine tumor and underwent Whipple procedure 3/14 with intra-operative SMV laceration and repair by vascular with bovine pericardial patch.  Pt had worsening shock overnight and required three pressors despite massive transfusion protocol.  He was taken back to the OR for washout on 3/15 and returned to the ICU intubated   Past Medical History:   has a past medical history of AICD (automatic cardioverter/defibrillator) present, Dyslipidemia, Heart failure with mildly reduced EF, History of kidney stones, HTN (hypertension), ventricular fibrillation, and Nonischemic cardiomyopathy.   Significant Hospital Events:  3/14 Admit to Surgery, to OR for whipple, SMV injury and repair, massive transfusion protocol overnight  3/15 Back to OR for washout, x2. IR for arteriogram. No major bleed source identified in OR, or arterial bleed w IR. Robust product resuscitation >75 products (+ TXA + novoseven) 3/16 off pressors, add'l 39 products overnight. Slowing resuscitation this morning with hemodynamic improvements and slowing drain output  Consults:  PCCM VVS  IR  Procedures:  3/15 ETT  Significant Diagnostic Tests:  3/15 mesenteric arteriogram> no identified arterial source of bleed  Micro Data:  3/15 MRSA>>negative  Antimicrobials:  Ancef 3/14 Flagyl 3/14  Interim History / Subjective:   OR x 2 yesterday- washout, biliary t tube, wound vac -- washout, wound vac IR for arteriogram -- no arterial bleed for embolization  Aggressive balanced transfusion continue overnight with marked  Hemodynamic improvement since yesterday evening.  Off pressors x several hours Wound vac output significantly slowing (from 549ml q15-38min to 539ml q1hr+) and output is  much thinner   Thin bloody secretions from mouth approx 293ml   Decreased RR from 22 to 18   Objective   Blood pressure 136/63, pulse 82, temperature (!) 96.98 F (36.1 C), resp. rate (!) 22, height 6\' 2"  (1.88 m), weight 95.3 kg, SpO2 97 %.    Vent Mode: PRVC FiO2 (%):  [40 %-100 %] 40 % Set Rate:  [18 bmp-22 bmp] 22 bmp Vt Set:  [650 mL] 650 mL PEEP:  [5 cmH20] 5 cmH20 Plateau Pressure:  [27 cmH20-29 cmH20] 27 cmH20   Intake/Output Summary (Last 24 hours) at 09/24/2020 0714 Last data filed at 09/24/2020 0615 Gross per 24 hour  Intake 20856.12 ml  Output 24795 ml  Net -3938.88 ml   Filed Weights   09/24/2020 0639  Weight: 95.3 kg    Examination: General: Critically ill appearing middle aged M, intubated sedated  HENT: Slightly injected sclera. ETT secure. Pink mmm with bloody oral secretions  Lungs: CTAb, mechanically ventilated.  Cardiovascular: bradycardic rate, occasional pvc. s1s2 cap refill < 3 seconds  Abdomen: Distended, laterally somewhat firm. Soft near midline wound vac. Oozing from drain sites  Extremities: Non-pitting BUE BLE edema. No acute joint deformity  Neuro: Sedated. PERRL GU: Yellow urine, foley. Edematous testes   Resolved Hospital Problem list     Assessment & Plan:  3/16: POD2 whipple smv repair hemicolectomy, POD1 washout x 2 with biliary t tube, wound vac applications. Case and care d/w CCS and CCM physicians, nursing staff and rt.   Acute respiratory failure with hypoxia, requiring mechanical ventilaion -intubated 3/15> -at risk for ards trali but so far cxr pretty good  P -Full MV support do not wean. I decreased RR 22 to 18  based on abg -RASS -4: prop, fent gtt  -VAP, pulm hygiene  -AM CXR, PRN ABG  Hemorrhagic shock Coagulopathy Thrombocytopenia -Had an SMA injury with his initial op on 3/14 from tumor location but stable post op. On both take backs 3/15 no obvious major source. No arterial bleed w IR  -has had very aggressive,  fairly balanced resusc -- about 120 blood products P -slowing product resusc -will follow teg and usual labs -- got prbc, ffp cryo plt this morning prior to teg result  -plan for pressors vs product discussed with all involved parties. If pressor does become indicated would favor levo.   Neuroendocrine tumor s/p whipple and r hemicolectomy, smv repair (3/14); s/p washout x 2 with biliary t tube placement, wound vac application Open abdomen P -CCS primary -Leave wound vac at 100, management of vac per CCS  -tentatively sounds like return OR 3/17 -pain/sedation as above; I imagine pt would be likely to stay open beyond OR 3/17  AKI -in setting of shock -uop decr overnight, 122ml for the shift. Cr has incr to 2.6 3/16  P -strict I/O, hourly UOP. -trend renal indices, repeat labs 3/16 midday are pending   Hypocalcemia Hypomagnesemia P -cont aggressive replacement, trend  HFrEF NICM HTN P -ICU monitoring -holding home meds in setting of shock -has AICD   Inadequate PO intake -anticipate will need TPN soon    Best practice (evaluated daily)  Diet: NPO Pain/Anxiety/Delirium protocol (if indicated): Propofol, Fentanyl VAP protocol (if indicated): HOB 30 degrees, suction prn DVT prophylaxis: SCD's GI prophylaxis: protonix Glucose control: SSI Mobility: bed rest Disposition: ICU  Goals of Care:  Last date of multidisciplinary goals of care discussion: per primary Family and staff present:  Summary of discussion:  Follow up goals of care discussion due:  Code Status:  Full Code  Labs   CBC: Recent Labs  Lab 09/09/2020 1228 10/07/2020 1535 09/15/2020 1947 10/07/2020 1953 09/17/2020 2153 09/24/20 0004 09/24/20 0400 09/24/20 0652  WBC 6.1  --  3.3*  --  4.8 4.2 2.3*  --   HGB 8.3*   < > 10.1* 8.8* 12.6* 12.6* 11.0* 9.2*  HCT 25.7*   < > 30.3* 26.0* 36.5* 35.7* 30.4* 27.0*  MCV 88.0  --  85.8  --  86.7 86.4 83.3  --   PLT 122*  127*  --  52*  52*  --  50* 69* 50*  --     < > = values in this interval not displayed.    Basic Metabolic Panel: Recent Labs  Lab 10/07/2020 1022 10/04/2020 1029 09/13/2020 1228 09/16/2020 1535 09/17/2020 1953 10/01/2020 2153 09/24/20 0004 09/24/20 0400 09/24/20 0652  NA 146*   < > 146*   < > 146* 144 145 146* 148*  K 4.5   < > 5.2*   < > 4.6 4.6 4.3 3.9 3.8  CL 115*  --  111  --   --  108 108 107  --   CO2 16*  --  18*  --   --  19* 20* 24  --   GLUCOSE 188*  --  206*  --   --  134* 120* 101*  --   BUN 16  --  16  --   --  19 20 22*  --   CREATININE 1.90*  --  2.14*  --   --  2.32* 2.49* 2.61*  --   CALCIUM 6.6*  --  6.9*  --   --  8.6*  7.9* 8.0*  --   MG 1.3*  --   --   --   --  2.1 1.9 1.8  --    < > = values in this interval not displayed.   GFR: Estimated Creatinine Clearance: 39.8 mL/min (A) (by C-G formula based on SCr of 2.61 mg/dL (H)). Recent Labs  Lab 09/14/2020 1947 09/14/2020 2153 09/24/20 0004 09/24/20 0400  WBC 3.3* 4.8 4.2 2.3*    Liver Function Tests: Recent Labs  Lab 10/05/2020 1728 09/18/2020 0413 10/02/2020 1228 09/10/2020 2153 09/24/20 0400  AST 29 64* 261* 409* 369*  ALT 16 38 101* 128* 104*  ALKPHOS 34* 34* 34* 34* 40  BILITOT 1.5* 1.1 1.6* 2.4* 2.5*  PROT 4.7* 4.3* 4.3* 4.1* 4.4*  ALBUMIN 2.9* 3.0* 2.3* 2.4* 2.5*   No results for input(s): LIPASE, AMYLASE in the last 168 hours. No results for input(s): AMMONIA in the last 168 hours.  ABG    Component Value Date/Time   PHART 7.558 (H) 09/24/2020 0652   PCO2ART 30.0 (L) 09/24/2020 0652   PO2ART 72 (L) 09/24/2020 0652   HCO3 26.8 09/24/2020 0652   TCO2 28 09/24/2020 0652   ACIDBASEDEF 2.0 10/06/2020 1953   O2SAT 97.0 09/24/2020 0652     Coagulation Profile: Recent Labs  Lab 10/02/2020 0413 09/14/2020 1100 09/10/2020 1228 10/02/2020 1947 09/24/20 0400  INR 2.6* 1.6* 1.5* 0.9 1.2    Cardiac Enzymes: No results for input(s): CKTOTAL, CKMB, CKMBINDEX, TROPONINI in the last 168 hours.  HbA1C: Hgb A1c MFr Bld  Date/Time Value Ref Range  Status  09/26/2020 04:13 AM 5.2 4.8 - 5.6 % Final    Comment:    (NOTE) Pre diabetes:          5.7%-6.4%  Diabetes:              >6.4%  Glycemic control for   <7.0% adults with diabetes   11/16/2018 04:41 PM 5.6 4.8 - 5.6 % Final    Comment:             Prediabetes: 5.7 - 6.4          Diabetes: >6.4          Glycemic control for adults with diabetes: <7.0     CBG: Recent Labs  Lab 10/02/2020 0402 10/07/2020 1226 09/20/2020 1944 09/11/2020 2340 09/24/20 0352  GLUCAP 314* 178* 120* 95 77    CRITICAL CARE Performed by: Cristal Generous   Total critical care time: 100 minutes  Critical care time was exclusive of separately billable procedures and treating other patients.  Critical care was necessary to treat or prevent imminent or life-threatening deterioration.  Critical care was time spent personally by me on the following activities: development of treatment plan with patient and/or surrogate as well as nursing, discussions with consultants, evaluation of patient's response to treatment, examination of patient, obtaining history from patient or surrogate, ordering and performing treatments and interventions, ordering and review of laboratory studies, ordering and review of radiographic studies, pulse oximetry and re-evaluation of patient's condition.  CRITICAL CARE Performed by: Cristal Generous   Total critical care time: 100 minutes  Critical care time was exclusive of separately billable procedures and treating other patients. Critical care was necessary to treat or prevent imminent or life-threatening deterioration.  Critical care was time spent personally by me on the following activities: development of treatment plan with patient and/or surrogate as well as nursing, discussions with consultants, evaluation of patient's response to treatment, examination  of patient, obtaining history from patient or surrogate, ordering and performing treatments and interventions, ordering  and review of laboratory studies, ordering and review of radiographic studies, pulse oximetry and re-evaluation of patient's condition.  [name] MSN, AGACNP-BC [place] [phone] If no answer, [phone] 09/24/2020, 1:34 PM

## 2020-09-24 NOTE — Progress Notes (Signed)
Patient re-examined at bedside this evening. Vac output has significantly decreased and is much thinner. He has not required any blood products since this morning and hgb remains stable at 11. He is on a low dose of epi currently but has been off pressors for most of the day. UOP is slowly improving and creatinine appears to be reaching a plateau. He did have several episodes of hematemesis this afternoon. NG is not functioning and on attempts at removal it will not withdraw. Unclear why this is happening, intraop palpation confirmed appropriate position in the stomach and it is not sutured into the anastomosis. OG has been placed at bedside and is functioning, draining thin blood-tinged fluid. I suspect patient had a GI bleed, probably from the DJ anastomosis, while he was coagulopathic. His hgb has not changed since the episodes of hematemesis so we will hold off on transfusing for now. He overall is continuing to slowly improve. Family has been updated at bedside throughout the day. I will take him to the OR tomorrow for abdominal washout and vac change. Will also try to palpate the NG tube in the OR and remove it. Will also interrogate the T tube, suspect it is kinked.  Michaelle Birks, [facility], Hepatobiliary and Pancreatic Surgery 09/24/20 6:15 PM

## 2020-09-24 NOTE — Progress Notes (Signed)
PCCM Brief Progress Note  50 yo m POD 2 whipple, post op course c/b hemorrhagic shock - POD 1 two tack backs to OR with washouts and wound vac applications. Large blood product resusc outlined in previous notes.   Wound vac has put about 1.3 liters of thinner bloody output UOP averaging 44m/hr  Has had bloody oral secretions vs hematemasis --earlier in shift looked like bloody secretions but not frank blood, no discrete oral source-- yielding approx 250 ml output This afternoon, has had much more frank bloody production, more c/w hematemesis -- approx 300 ml of this + amount lost on chucks/bedding/washclothes. (For total measured 5510mcollected + unknown unmeasured).  Attempted to remove NGT for exchange however met resistance and deferred.   Approx 1700 (following third bout of increased volume hematemasis) MAPs low 50s -- started on low dose Epi gtt (became brady w NE), continues at this time  Repeat CBC stable - hgb 11 plt 69. INR nominal incr to 1.4. Fibrinogen 440 -- nothing actionable at present  TEG pending -- f/u     GrEliseo GumSN, AGACNP-BC [name] no answer, [phone]/16/2022, 5:53 PM

## 2020-09-24 NOTE — Progress Notes (Signed)
Initial Nutrition Assessment  DOCUMENTATION CODES:   Not applicable  INTERVENTION:   Spoke with Dr Zenia Resides; plan to start TPN in 24-48 hours pt currently being volume resuscitated.   Pt at high risk of malnutrition due to catabolic state.   NUTRITION DIAGNOSIS:   Increased nutrient needs related to post-op healing as evidenced by estimated needs.  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  Developed cholecystitis in Feb 2022 and had percutaneous cholecystectomy tube placement.  Presented this admission for neuroendocrine tumor resection with Whipple procedure, also had right total colectomy, colostomy and cholecystectomy on 10/02/2020.  SMV was lacerated during procedure and repaired by bovine pericardial patch.  Developed hemorrhagic shock with bile leak post-op and underwent placement of biliary T tube with abdominal washout on 3/15.    Pt discussed during ICU rounds and with RN. Spoke with brother in [employer]. Per family no recent weight changes, good appetite PTA. Came in for elective surgery for his gallbladder per family.   3/14 s/p whipple; intra-operative SMV laceration s/p repair; massive transfusion protocol - 120 products; maxed on pressors  3/15 s/p washout and VAC placement  3/16 off pressors; VAC output decreasing, hematemesis with NG not functioning, OG placed   Patient is currently intubated on ventilator support MV: 21.2 L/min Temp (24hrs), Avg:97 F (36.1 C), Min:95.18 F (35.1 C), Max:98.24 F (36.8 C)  Propofol: 17.1 ml/hr provides: 451 kcal  Medications reviewed and include: colace, miralax Epinephrine @ 1.5 mcg  Labs reviewed: Na 146   Output:  RUQ drain: 150 ml Abd  drain: 1100 ml VAC abd: 10850 ml  18 F NG tube  NUTRITION - FOCUSED PHYSICAL EXAM:  Flowsheet Row Most Recent Value  Orbital Region No depletion  Upper Arm Region No depletion  Thoracic and Lumbar Region No depletion  Buccal Region No depletion  Temple Region No depletion  Clavicle Bone Region No depletion  Clavicle and Acromion Bone Region No depletion  Scapular Bone Region No depletion  Dorsal Hand No depletion  Patellar Region No depletion  Anterior Thigh Region No depletion  Posterior Calf Region No depletion  Edema (RD Assessment) Moderate  Hair Reviewed  Eyes Unable to assess  Mouth Unable to assess  Skin Reviewed  Nails Reviewed       Diet Order:   Diet Order            Diet NPO time specified  Diet effective now                 EDUCATION NEEDS:   No education needs have been identified at this time  Skin:  Skin Assessment:  (open abd with VAC)  Last BM:  200 ml via colostomy  Height:   Ht Readings from Last 1 Encounters:  09/21/2020 6\' 2"  (1.88 m)    Weight:   Wt Readings from Last 1 Encounters:  09/17/2020 95.3 kg    Ideal Body Weight:     BMI:  Body mass index is 26.96 kg/m.  Estimated Nutritional Needs:   Kcal:  0737-1062  Protein:  165-190 grams  Fluid:  > 2L/day  Lockie Pares., RD, LDN,  CNSC See AMiON for contact information

## 2020-09-24 NOTE — Progress Notes (Signed)
1 Day Post-Op  Subjective: IR angiogram yesterday showed no bleeding from SMA or celiac vessels. Overnight patient has continued to have high output from [place], but it has slowed down significantly and is thinner. Continuing to receive transfusions per MTP. Pressors have been off for a few hours. Bicarb and pH are now normal. Creatinine up to 2.6, UOP has been marginal overnight.   Objective: Vital signs in last 24 hours: Temp:  [95.18 F (35.1 C)-97.7 F (36.5 C)] 96.98 F (36.1 C) (03/16 0300) Pulse Rate:  [37-97] 82 (03/16 0356) Resp:  [18-32] 22 (03/16 0356) BP: (85-136)/(55-90) 136/63 (03/16 0356) SpO2:  [97 %-100 %] 97 % (03/16 0356) Arterial Line BP: (81-167)/(47-83) 116/54 (03/16 0300) FiO2 (%):  [40 %-100 %] 40 % (03/16 0356)    Intake/Output from previous day: 03/15 0701 - 03/16 0700 In: 20856.1 [I.V.:9510.3; GHWEX:93716; IV Piggyback:1060.8] Out: 96789 [FYBOF:7510; Emesis/NG output:150; Drains:13525; Stool:200; CHENI:7782] Intake/Output this shift: No intake/output data recorded.  PE: General: intubated, anasarca HEENT: NG in place with minimal drainage. ETT in place. Edematous. Cordis in place R neck. CV: RRR Resp: intubated, on vent Abdomen: distended but compressible. Abthera in place with thin sanguinous drainage.  LUQ pancreatic stent with clear colorless fluid. RUQ T tube with serosanguinous fluid. RLQ ileostomy dusky but viable with scant stool in bag. GU: foley in place, clear yellow urine  Lab Results:  Recent Labs    09/24/20 0004 09/24/20 0400 09/24/20 0652  WBC 4.2 2.3*  --   HGB 12.6* 11.0* 9.2*  HCT 35.7* 30.4* 27.0*  PLT 69* 50*  --    BMET Recent Labs    09/24/20 0004 09/24/20 0400 09/24/20 0652  NA 145 146* 148*  K 4.3 3.9 3.8  CL 108 107  --   CO2 20* 24  --   GLUCOSE 120* 101*  --   BUN 20 22*  --   CREATININE 2.49* 2.61*  --   CALCIUM 7.9* 8.0*  --    PT/INR Recent Labs    09/14/2020 1947 09/24/20 0400  LABPROT 11.9  14.6  INR 0.9 1.2   CMP     Component Value Date/Time   NA 148 (H) 09/24/2020 0652   NA 142 07/21/2020 0921   K 3.8 09/24/2020 0652   CL 107 09/24/2020 0400   CO2 24 09/24/2020 0400   GLUCOSE 101 (H) 09/24/2020 0400   BUN 22 (H) 09/24/2020 0400   BUN 16 07/21/2020 0921   CREATININE 2.61 (H) 09/24/2020 0400   CREATININE 1.16 03/01/2016 1607   CALCIUM 8.0 (L) 09/24/2020 0400   PROT 4.4 (L) 09/24/2020 0400   PROT 6.7 07/21/2020 0921   ALBUMIN 2.5 (L) 09/24/2020 0400   ALBUMIN 4.1 07/21/2020 0921   AST 369 (H) 09/24/2020 0400   ALT 104 (H) 09/24/2020 0400   ALKPHOS 40 09/24/2020 0400   BILITOT 2.5 (H) 09/24/2020 0400   BILITOT 0.8 07/21/2020 0921   GFRNONAA 29 (L) 09/24/2020 0400   GFRNONAA >89 09/22/2015 1008   GFRAA 82 07/21/2020 0921   GFRAA >89 09/22/2015 1008   Lipase     Component Value Date/Time   LIPASE 23 08/22/2020 0318       Studies/Results: IR Angiogram Visceral Selective  Result Date: 09/21/2020 Criselda Peaches, MD     09/17/2020  7:04 PM Interventional Radiology Procedure Note Procedure: Multi-selective visceral angiogram.  No evidence of arterial bleeding. Complications: None Estimated Blood Loss: None Recommendations: - Continue to transfuse as needed - Right common  femoral arteriotomy closed with Celt Signed, Criselda Peaches, MD   IR Angiogram Selective Each Additional Vessel  Result Date: 09/30/2020 INDICATION: 50 year old male with severe postoperative bleeding following Whipple procedure. He was taken back to the operating room for abdominal washout and no definitive arterial source for bleeding was identified. However, the patient continues to lose blood requiring multi unit transfusion. He presents to interventional radiology for visceral arteriography and possible embolization if a bleeding source can be identified. EXAM: SELECTIVE VISCERAL ARTERIOGRAPHY; ADDITIONAL ARTERIOGRAPHY MEDICATIONS: None ANESTHESIA/SEDATION: Provided by general  anesthesia CONTRAST:  50mL OMNIPAQUE IOHEXOL 300 MG/ML SOLN, 78mL OMNIPAQUE IOHEXOL 300 MG/ML SOLN FLUOROSCOPY TIME:  Fluoroscopy Time: 7 minutes 42 seconds (1742 mGy). COMPLICATIONS: None immediate. PROCEDURE: Informed consent was obtained from the patient following explanation of the procedure, risks, benefits and alternatives. The patient understands, agrees and consents for the procedure. All questions were addressed. A time out was performed prior to the initiation of the procedure. Maximal barrier sterile technique utilized including caps, mask, sterile gowns, sterile gloves, large sterile drape, hand hygiene, and Betadine prep. The right common femoral artery was interrogated with ultrasound and found to be widely patent. An image was obtained and stored for the medical record. Local anesthesia was attained by infiltration with 1% lidocaine. A small dermatotomy was made. Under real-time sonographic guidance, the vessel was punctured with a 21 gauge micropuncture needle. Using standard technique, the initial micro needle was exchanged over a 0.018 micro wire for a transitional 4 [place] micro sheath. The micro sheath was then exchanged over a 0.035 wire for a 5 French vascular sheath. A C2 cobra catheter was advanced over a Bentson wire and used to select the common trunk of the celiac artery and superior mesenteric artery. An arteriogram was performed. There is preferential filling of the celiac axis. The 5 French catheter was then advanced into the superior mesenteric artery over a glidewire. Arteriography was performed. There is severe vaso constriction of the SMA and its branches. No convincing evidence of active arterial bleeding on the initial images. The C2 cobra catheter was then advanced into the common hepatic artery. Arteriography was performed. The dorsal pancreatic artery is patent and unremarkable. The stump of the GDA appears grossly unremarkable. Significant vaso constriction throughout the  proper hepatic and intrahepatic arteries. There appears to be an accessory left gastric artery arising distally from the segment 2 hepatic artery. A renegade STC microcatheter was advanced over a Fathom 16 microwire into the gastroduodenal stump. Contrast injection was performed. The gastric stump is secure. No evidence of contrast extravasation. The microcatheter was removed. The 5 French catheter was brought back into the origin of the superior mesenteric artery. The microcatheter was reintroduced. The microcatheter was advanced into the origin of the middle colic artery. Contrast injection was performed demonstrating no evidence of active bleeding or arterial injury. The microcatheter was next advanced into the inferior pancreaticoduodenal artery and arteriography was performed. Again, no evidence of active bleeding or arterial injury. The microcatheter was advanced into the right middle colic artery and additional arteriography was performed. Again, no evidence of arterial injury or active bleeding. At this point, all of the vessels in the region were successfully interrogated with no evidence of active arterial bleeding. The procedure was terminated. The catheters and wires were removed. Hemostasis was attained with the assistance of a Celt arterial closure device. IMPRESSION: Multi selective visceral arteriography demonstrates no evidence of active arterial bleeding or vascular injury at this time. Electronically Signed   By:  Jacqulynn Cadet M.D.   On: 10/08/2020 19:21   IR Angiogram Selective Each Additional Vessel  Result Date: 09/17/2020 INDICATION: 50 year old male with severe postoperative bleeding following Whipple procedure. He was taken back to the operating room for abdominal washout and no definitive arterial source for bleeding was identified. However, the patient continues to lose blood requiring multi unit transfusion. He presents to interventional radiology for visceral arteriography and  possible embolization if a bleeding source can be identified. EXAM: SELECTIVE VISCERAL ARTERIOGRAPHY; ADDITIONAL ARTERIOGRAPHY MEDICATIONS: None ANESTHESIA/SEDATION: Provided by general anesthesia CONTRAST:  57mL OMNIPAQUE IOHEXOL 300 MG/ML SOLN, 40mL OMNIPAQUE IOHEXOL 300 MG/ML SOLN FLUOROSCOPY TIME:  Fluoroscopy Time: 7 minutes 42 seconds (1742 mGy). COMPLICATIONS: None immediate. PROCEDURE: Informed consent was obtained from the patient following explanation of the procedure, risks, benefits and alternatives. The patient understands, agrees and consents for the procedure. All questions were addressed. A time out was performed prior to the initiation of the procedure. Maximal barrier sterile technique utilized including caps, mask, sterile gowns, sterile gloves, large sterile drape, hand hygiene, and Betadine prep. The right common femoral artery was interrogated with ultrasound and found to be widely patent. An image was obtained and stored for the medical record. Local anesthesia was attained by infiltration with 1% lidocaine. A small dermatotomy was made. Under real-time sonographic guidance, the vessel was punctured with a 21 gauge micropuncture needle. Using standard technique, the initial micro needle was exchanged over a 0.018 micro wire for a transitional 4 [place] micro sheath. The micro sheath was then exchanged over a 0.035 wire for a 5 French vascular sheath. A C2 cobra catheter was advanced over a Bentson wire and used to select the common trunk of the celiac artery and superior mesenteric artery. An arteriogram was performed. There is preferential filling of the celiac axis. The 5 French catheter was then advanced into the superior mesenteric artery over a glidewire. Arteriography was performed. There is severe vaso constriction of the SMA and its branches. No convincing evidence of active arterial bleeding on the initial images. The C2 cobra catheter was then advanced into the common hepatic artery.  Arteriography was performed. The dorsal pancreatic artery is patent and unremarkable. The stump of the GDA appears grossly unremarkable. Significant vaso constriction throughout the proper hepatic and intrahepatic arteries. There appears to be an accessory left gastric artery arising distally from the segment 2 hepatic artery. A renegade STC microcatheter was advanced over a Fathom 16 microwire into the gastroduodenal stump. Contrast injection was performed. The gastric stump is secure. No evidence of contrast extravasation. The microcatheter was removed. The 5 French catheter was brought back into the origin of the superior mesenteric artery. The microcatheter was reintroduced. The microcatheter was advanced into the origin of the middle colic artery. Contrast injection was performed demonstrating no evidence of active bleeding or arterial injury. The microcatheter was next advanced into the inferior pancreaticoduodenal artery and arteriography was performed. Again, no evidence of active bleeding or arterial injury. The microcatheter was advanced into the right middle colic artery and additional arteriography was performed. Again, no evidence of arterial injury or active bleeding. At this point, all of the vessels in the region were successfully interrogated with no evidence of active arterial bleeding. The procedure was terminated. The catheters and wires were removed. Hemostasis was attained with the assistance of a Celt arterial closure device. IMPRESSION: Multi selective visceral arteriography demonstrates no evidence of active arterial bleeding or vascular injury at this time. Electronically Signed   By: Myrle Sheng  Laurence Ferrari M.D.   On: 10/03/2020 19:21   IR Angiogram Selective Each Additional Vessel  Result Date: 09/19/2020 INDICATION: 50 year old male with severe postoperative bleeding following Whipple procedure. He was taken back to the operating room for abdominal washout and no definitive arterial source  for bleeding was identified. However, the patient continues to lose blood requiring multi unit transfusion. He presents to interventional radiology for visceral arteriography and possible embolization if a bleeding source can be identified. EXAM: SELECTIVE VISCERAL ARTERIOGRAPHY; ADDITIONAL ARTERIOGRAPHY MEDICATIONS: None ANESTHESIA/SEDATION: Provided by general anesthesia CONTRAST:  101mL OMNIPAQUE IOHEXOL 300 MG/ML SOLN, 34mL OMNIPAQUE IOHEXOL 300 MG/ML SOLN FLUOROSCOPY TIME:  Fluoroscopy Time: 7 minutes 42 seconds (1742 mGy). COMPLICATIONS: None immediate. PROCEDURE: Informed consent was obtained from the patient following explanation of the procedure, risks, benefits and alternatives. The patient understands, agrees and consents for the procedure. All questions were addressed. A time out was performed prior to the initiation of the procedure. Maximal barrier sterile technique utilized including caps, mask, sterile gowns, sterile gloves, large sterile drape, hand hygiene, and Betadine prep. The right common femoral artery was interrogated with ultrasound and found to be widely patent. An image was obtained and stored for the medical record. Local anesthesia was attained by infiltration with 1% lidocaine. A small dermatotomy was made. Under real-time sonographic guidance, the vessel was punctured with a 21 gauge micropuncture needle. Using standard technique, the initial micro needle was exchanged over a 0.018 micro wire for a transitional 4 [place] micro sheath. The micro sheath was then exchanged over a 0.035 wire for a 5 French vascular sheath. A C2 cobra catheter was advanced over a Bentson wire and used to select the common trunk of the celiac artery and superior mesenteric artery. An arteriogram was performed. There is preferential filling of the celiac axis. The 5 French catheter was then advanced into the superior mesenteric artery over a glidewire. Arteriography was performed. There is severe vaso  constriction of the SMA and its branches. No convincing evidence of active arterial bleeding on the initial images. The C2 cobra catheter was then advanced into the common hepatic artery. Arteriography was performed. The dorsal pancreatic artery is patent and unremarkable. The stump of the GDA appears grossly unremarkable. Significant vaso constriction throughout the proper hepatic and intrahepatic arteries. There appears to be an accessory left gastric artery arising distally from the segment 2 hepatic artery. A renegade STC microcatheter was advanced over a Fathom 16 microwire into the gastroduodenal stump. Contrast injection was performed. The gastric stump is secure. No evidence of contrast extravasation. The microcatheter was removed. The 5 French catheter was brought back into the origin of the superior mesenteric artery. The microcatheter was reintroduced. The microcatheter was advanced into the origin of the middle colic artery. Contrast injection was performed demonstrating no evidence of active bleeding or arterial injury. The microcatheter was next advanced into the inferior pancreaticoduodenal artery and arteriography was performed. Again, no evidence of active bleeding or arterial injury. The microcatheter was advanced into the right middle colic artery and additional arteriography was performed. Again, no evidence of arterial injury or active bleeding. At this point, all of the vessels in the region were successfully interrogated with no evidence of active arterial bleeding. The procedure was terminated. The catheters and wires were removed. Hemostasis was attained with the assistance of a Celt arterial closure device. IMPRESSION: Multi selective visceral arteriography demonstrates no evidence of active arterial bleeding or vascular injury at this time. Electronically Signed   By: Jacqulynn Cadet  M.D.   On: 10/08/2020 19:21   DG Chest Port 1 View  Result Date: 09/24/2020 CLINICAL DATA:   Endotracheal tube EXAM: PORTABLE CHEST 1 VIEW COMPARISON:  Chest x-ray 09/12/2020 FINDINGS: Interval retraction of an endotracheal tube with tip terminating 4.5 cm above the carina. Right internal jugular central venous catheter with tip terminating overlying the expected region of the distal superior vena cava. Enteric tube coursing below the hemidiaphragm with tip collimated off view and side port overlying the expected region of the gastric lumen. Left chest wall cardiac defibrillator in stable position. The heart size and mediastinal contours are unchanged. Persistent, slightly improved, bilateral patchy airspace opacities. No pulmonary edema. No pleural effusion. No pneumothorax. No acute osseous abnormality. IMPRESSION: 1. Persistent, slightly improved, bilateral patchy airspace opacities. 2. Lines and tubes in appropriate position. Electronically Signed   By: Iven Finn M.D.   On: 09/24/2020 04:51   DG CHEST PORT 1 VIEW  Result Date: 09/12/2020 CLINICAL DATA:  Intubated EXAM: PORTABLE CHEST 1 VIEW COMPARISON:  09/30/2020 FINDINGS: Endotracheal tube tip 2 cm above the carina. Orogastric or nasogastric tube enters the abdomen. Pacemaker defibrillator remains in place. Right internal jugular central line tip in the SVC 3 cm above the right atrium. Mild cardiomegaly as seen previously. Increased perihilar lung density, more on the right than the left. This could be due to acute edema or aspiration. IMPRESSION: Increased perihilar lung density, more on the right than the left. This could be due to acute edema or aspiration. Lines and tubes well positioned. Electronically Signed   By: Nelson Chimes M.D.   On: 09/21/2020 12:25   DG CHEST PORT 1 VIEW  Result Date: 09/26/2020 CLINICAL DATA:  Central line placement, postop blood pool procedure. EXAM: PORTABLE CHEST 1 VIEW COMPARISON:  02/01/2014 FINDINGS: Right internal jugular sheath tip projects over the brachiocephalic SVC confluence. No pneumothorax.  Enteric tube in place with tip and side-port below the diaphragm in the stomach. Single lead left-sided pacemaker in place. Lower lung volumes. Normal heart size for technique. No pleural fluid or pulmonary edema. No focal airspace disease. IMPRESSION: 1. Right internal jugular sheath tip projects over the brachiocephalic SVC confluence. No pneumothorax. 2. Enteric tube with tip and side-port below the diaphragm in the stomach. Electronically Signed   By: Keith Rake M.D.   On: 09/10/2020 18:08    Anti-infectives: Anti-infectives (From admission, onward)   Start     Dose/Rate Route Frequency Ordered Stop   09/21/2020 0915  metroNIDAZOLE (FLAGYL) IVPB 500 mg  Status:  Discontinued        500 mg 100 mL/hr over 60 Minutes Intravenous To Surgery 10/04/2020 8242 10/07/2020 0951   09/13/2020 0630  ceFAZolin (ANCEF) IVPB 2g/100 mL premix       "And" Linked Group Details   2 g 200 mL/hr over 30 Minutes Intravenous On call to O.R. 10/02/2020 3536 09/13/2020 1215   09/17/2020 0630  metroNIDAZOLE (FLAGYL) IVPB 500 mg       "And" Linked Group Details   500 mg 100 mL/hr over 60 Minutes Intravenous On call to O.R. 09/15/2020 1443 10/05/2020 0820       Assessment/Plan 50 yo male with 4cm neuroendocrine tumor of the head of pancreas/colonic mesentery, POD2 s/p right hemicolectomy, Whipple and patch angioplasty repair of SMV. Complicated by postoperative hemorrhagic shock POD1 s/p takeback, abdominal washout and biliary T tube placement. - Patient is hemodynamically much improved today with correction of acidosis. He is still having significant drainage from [place] but  it has slowed significantly and is thinner in appearance. He will need ongoing transfusion today but I anticipate we can stop MTP and transfuse as needed. I don't think there will be much utility to taking him back to the OR today as two explorations yesterday did not identify a single source of bleeding. Continue resuscitation, anticipate bleeding will  continue to slow and I will plan to take him back to the OR tomorrow to remove the packs. Anticipate it will be next week before his abdomen can be closed due to significant edema. - T tube not currently draining bile, may be kinked, will address this on return to OR. - Patient will have a prolonged ileus, will need to start TPN in the next few days. - Appreciate CCM assistance with vent management and resuscitation - VTE: chemical DVT ppx on hold in setting of ongoing bleeding - Dispo: ICU    LOS: 2 days    Michaelle Birks, MD [facility] General, Hepatobiliary and Pancreatic Surgery 09/24/20 7:08 AM

## 2020-09-24 NOTE — Addendum Note (Signed)
Addendum  created 09/24/20 0802 by Wilburn Cornelia, CRNA   Order list changed

## 2020-09-24 NOTE — Progress Notes (Signed)
Referring Physician(s): [name] ([place])  Supervising Physician: Aletta Edouard  Patient Status:  [facility] - In-pt  Chief Complaint: GI bleed follow-up  Subjective:  History of neuroendocrine tumor of pancreas s/p Whipple procedure with hemicolectomy in OR 09/18/2020 by Dr. Zenia Resides; complicated by hemorrhagic shock requiring pressors despite multiple blood transfusions s/p emergent ex lap with abdominal washout, biliary T tube placement, and placement of negative pressure abdominal dressing; with continued hemorrhagic shock s/p mesenteric arteriogram via right femoral approach revealing no evidence of arterial bleeding (procedure was aborted). Patient lying in bed, intubated with sedation. Critically ill appearing. No longer requiring pressors, still on MTP. Right femoral puncture site c/d/i.   Allergies: Ace inhibitors  Medications: Prior to Admission medications   Medication Sig Start Date End Date Taking? Authorizing Provider  acetaminophen (TYLENOL) 500 MG tablet Take 2 tablets (1,000 mg total) by mouth every 6 (six) hours. Patient taking differently: Take 1,000 mg by mouth every 6 (six) hours as needed for mild pain. 08/22/20  Yes [name], MD  allopurinol (ZYLOPRIM) 100 MG tablet Take one tablet by mouth daily for 1 week and then 2 tablets by mouth daily Patient taking differently: Take 200 mg by mouth daily. 07/03/20  Yes Swords, Darrick Penna, MD  amiodarone (PACERONE) 200 MG tablet Take 1 tablet (200 mg total) by mouth daily. Patient taking differently: Take 200 mg by mouth daily with supper. 07/30/20  Yes Evans Lance, MD  aspirin 81 MG tablet Take 1 tablet (81 mg total) by mouth 2 (two) times daily. Patient taking differently: Take 81 mg by mouth daily. 09/22/15  Yes Funches, Josalyn, MD  bisoprolol (ZEBETA) 10 MG tablet Take 1 tablet by mouth once daily Patient taking differently: Take 10 mg by mouth daily. 08/25/20  Yes Evans Lance, MD  budesonide-formoterol  [facility]) 80-4.5 MCG/ACT inhaler Inhale 2 puffs into the lungs 2 (two) times daily. Must keep upcoming office visit for refills Patient taking differently: Inhale 2 puffs into the lungs 2 (two) times daily as needed (for flares). 11/09/19  Yes Fulp, Cammie, MD  sacubitril-valsartan (ENTRESTO) 24-26 MG Take 1 tablet by mouth 2 (two) times daily. 06/25/20  Yes Evans Lance, MD  oxyCODONE (OXY IR/ROXICODONE) 5 MG immediate release tablet Take 1-2 tablets (5-10 mg total) by mouth every 6 (six) hours as needed for severe pain. Patient not taking: Reported on 09/10/2020 08/22/20   [name], MD     Vital Signs: BP (!) 101/57   Pulse 67   Temp (!) 97.52 F (36.4 C)   Resp 18   Ht 6\' 2"  (1.88 m)   Wt 210 lb (95.3 kg)   SpO2 98%   BMI 26.96 kg/m   Physical Exam Constitutional:      General: He is not in acute distress.    Appearance: He is ill-appearing.     Comments: Intubated with sedation. (+) warming blanket. (+) rapid blood infusor.  Pulmonary:     Effort: Pulmonary effort is normal. No respiratory distress.     Comments: Intubated with sedation. Abdominal:     Comments: (+) midline abdominal wound with negative pressure dressing.   Skin:    General: Skin is warm and dry.     Comments: Right femoral puncture site soft without active bleeding or hematoma.  Neurological:     Comments: Intubated with sedation. Does not respond to voice or painful stimuli. Distal pulses (DPs) 1+ bilaterally.     Imaging: IR Angiogram Visceral  Selective  Result Date: 09/16/2020 Criselda Peaches, MD     09/11/2020  7:04 PM Interventional Radiology Procedure Note Procedure: Multi-selective visceral angiogram.  No evidence of arterial bleeding. Complications: None Estimated Blood Loss: None Recommendations: - Continue to transfuse as needed - Right common femoral arteriotomy closed with Celt Signed, Criselda Peaches, MD   IR Angiogram Selective Each Additional Vessel  Result Date:  10/09/2020 INDICATION: 50 year old male with severe postoperative bleeding following Whipple procedure. He was taken back to the operating room for abdominal washout and no definitive arterial source for bleeding was identified. However, the patient continues to lose blood requiring multi unit transfusion. He presents to interventional radiology for visceral arteriography and possible embolization if a bleeding source can be identified. EXAM: SELECTIVE VISCERAL ARTERIOGRAPHY; ADDITIONAL ARTERIOGRAPHY MEDICATIONS: None ANESTHESIA/SEDATION: Provided by general anesthesia CONTRAST:  4mL OMNIPAQUE IOHEXOL 300 MG/ML SOLN, 23mL OMNIPAQUE IOHEXOL 300 MG/ML SOLN FLUOROSCOPY TIME:  Fluoroscopy Time: 7 minutes 42 seconds (1742 mGy). COMPLICATIONS: None immediate. PROCEDURE: Informed consent was obtained from the patient following explanation of the procedure, risks, benefits and alternatives. The patient understands, agrees and consents for the procedure. All questions were addressed. A time out was performed prior to the initiation of the procedure. Maximal barrier sterile technique utilized including caps, mask, sterile gowns, sterile gloves, large sterile drape, hand hygiene, and Betadine prep. The right common femoral artery was interrogated with ultrasound and found to be widely patent. An image was obtained and stored for the medical record. Local anesthesia was attained by infiltration with 1% lidocaine. A small dermatotomy was made. Under real-time sonographic guidance, the vessel was punctured with a 21 gauge micropuncture needle. Using standard technique, the initial micro needle was exchanged over a 0.018 micro wire for a transitional 4 [place] micro sheath. The micro sheath was then exchanged over a 0.035 wire for a 5 French vascular sheath. A C2 cobra catheter was advanced over a Bentson wire and used to select the common trunk of the celiac artery and superior mesenteric artery. An arteriogram was performed.  There is preferential filling of the celiac axis. The 5 French catheter was then advanced into the superior mesenteric artery over a glidewire. Arteriography was performed. There is severe vaso constriction of the SMA and its branches. No convincing evidence of active arterial bleeding on the initial images. The C2 cobra catheter was then advanced into the common hepatic artery. Arteriography was performed. The dorsal pancreatic artery is patent and unremarkable. The stump of the GDA appears grossly unremarkable. Significant vaso constriction throughout the proper hepatic and intrahepatic arteries. There appears to be an accessory left gastric artery arising distally from the segment 2 hepatic artery. A renegade STC microcatheter was advanced over a Fathom 16 microwire into the gastroduodenal stump. Contrast injection was performed. The gastric stump is secure. No evidence of contrast extravasation. The microcatheter was removed. The 5 French catheter was brought back into the origin of the superior mesenteric artery. The microcatheter was reintroduced. The microcatheter was advanced into the origin of the middle colic artery. Contrast injection was performed demonstrating no evidence of active bleeding or arterial injury. The microcatheter was next advanced into the inferior pancreaticoduodenal artery and arteriography was performed. Again, no evidence of active bleeding or arterial injury. The microcatheter was advanced into the right middle colic artery and additional arteriography was performed. Again, no evidence of arterial injury or active bleeding. At this point, all of the vessels in the region were successfully interrogated with no evidence of active  arterial bleeding. The procedure was terminated. The catheters and wires were removed. Hemostasis was attained with the assistance of a Celt arterial closure device. IMPRESSION: Multi selective visceral arteriography demonstrates no evidence of active arterial  bleeding or vascular injury at this time. Electronically Signed   By: Jacqulynn Cadet M.D.   On: 09/09/2020 19:21   IR Angiogram Selective Each Additional Vessel  Result Date: 10/01/2020 INDICATION: 50 year old male with severe postoperative bleeding following Whipple procedure. He was taken back to the operating room for abdominal washout and no definitive arterial source for bleeding was identified. However, the patient continues to lose blood requiring multi unit transfusion. He presents to interventional radiology for visceral arteriography and possible embolization if a bleeding source can be identified. EXAM: SELECTIVE VISCERAL ARTERIOGRAPHY; ADDITIONAL ARTERIOGRAPHY MEDICATIONS: None ANESTHESIA/SEDATION: Provided by general anesthesia CONTRAST:  72mL OMNIPAQUE IOHEXOL 300 MG/ML SOLN, 49mL OMNIPAQUE IOHEXOL 300 MG/ML SOLN FLUOROSCOPY TIME:  Fluoroscopy Time: 7 minutes 42 seconds (1742 mGy). COMPLICATIONS: None immediate. PROCEDURE: Informed consent was obtained from the patient following explanation of the procedure, risks, benefits and alternatives. The patient understands, agrees and consents for the procedure. All questions were addressed. A time out was performed prior to the initiation of the procedure. Maximal barrier sterile technique utilized including caps, mask, sterile gowns, sterile gloves, large sterile drape, hand hygiene, and Betadine prep. The right common femoral artery was interrogated with ultrasound and found to be widely patent. An image was obtained and stored for the medical record. Local anesthesia was attained by infiltration with 1% lidocaine. A small dermatotomy was made. Under real-time sonographic guidance, the vessel was punctured with a 21 gauge micropuncture needle. Using standard technique, the initial micro needle was exchanged over a 0.018 micro wire for a transitional 4 [place] micro sheath. The micro sheath was then exchanged over a 0.035 wire for a 5 French vascular  sheath. A C2 cobra catheter was advanced over a Bentson wire and used to select the common trunk of the celiac artery and superior mesenteric artery. An arteriogram was performed. There is preferential filling of the celiac axis. The 5 French catheter was then advanced into the superior mesenteric artery over a glidewire. Arteriography was performed. There is severe vaso constriction of the SMA and its branches. No convincing evidence of active arterial bleeding on the initial images. The C2 cobra catheter was then advanced into the common hepatic artery. Arteriography was performed. The dorsal pancreatic artery is patent and unremarkable. The stump of the GDA appears grossly unremarkable. Significant vaso constriction throughout the proper hepatic and intrahepatic arteries. There appears to be an accessory left gastric artery arising distally from the segment 2 hepatic artery. A renegade STC microcatheter was advanced over a Fathom 16 microwire into the gastroduodenal stump. Contrast injection was performed. The gastric stump is secure. No evidence of contrast extravasation. The microcatheter was removed. The 5 French catheter was brought back into the origin of the superior mesenteric artery. The microcatheter was reintroduced. The microcatheter was advanced into the origin of the middle colic artery. Contrast injection was performed demonstrating no evidence of active bleeding or arterial injury. The microcatheter was next advanced into the inferior pancreaticoduodenal artery and arteriography was performed. Again, no evidence of active bleeding or arterial injury. The microcatheter was advanced into the right middle colic artery and additional arteriography was performed. Again, no evidence of arterial injury or active bleeding. At this point, all of the vessels in the region were successfully interrogated with no evidence of active arterial  bleeding. The procedure was terminated. The catheters and wires were  removed. Hemostasis was attained with the assistance of a Celt arterial closure device. IMPRESSION: Multi selective visceral arteriography demonstrates no evidence of active arterial bleeding or vascular injury at this time. Electronically Signed   By: Jacqulynn Cadet M.D.   On: 10/02/2020 19:21   IR Angiogram Selective Each Additional Vessel  Result Date: 09/24/2020 INDICATION: 50 year old male with severe postoperative bleeding following Whipple procedure. He was taken back to the operating room for abdominal washout and no definitive arterial source for bleeding was identified. However, the patient continues to lose blood requiring multi unit transfusion. He presents to interventional radiology for visceral arteriography and possible embolization if a bleeding source can be identified. EXAM: SELECTIVE VISCERAL ARTERIOGRAPHY; ADDITIONAL ARTERIOGRAPHY MEDICATIONS: None ANESTHESIA/SEDATION: Provided by general anesthesia CONTRAST:  60mL OMNIPAQUE IOHEXOL 300 MG/ML SOLN, 25mL OMNIPAQUE IOHEXOL 300 MG/ML SOLN FLUOROSCOPY TIME:  Fluoroscopy Time: 7 minutes 42 seconds (1742 mGy). COMPLICATIONS: None immediate. PROCEDURE: Informed consent was obtained from the patient following explanation of the procedure, risks, benefits and alternatives. The patient understands, agrees and consents for the procedure. All questions were addressed. A time out was performed prior to the initiation of the procedure. Maximal barrier sterile technique utilized including caps, mask, sterile gowns, sterile gloves, large sterile drape, hand hygiene, and Betadine prep. The right common femoral artery was interrogated with ultrasound and found to be widely patent. An image was obtained and stored for the medical record. Local anesthesia was attained by infiltration with 1% lidocaine. A small dermatotomy was made. Under real-time sonographic guidance, the vessel was punctured with a 21 gauge micropuncture needle. Using standard technique,  the initial micro needle was exchanged over a 0.018 micro wire for a transitional 4 [place] micro sheath. The micro sheath was then exchanged over a 0.035 wire for a 5 French vascular sheath. A C2 cobra catheter was advanced over a Bentson wire and used to select the common trunk of the celiac artery and superior mesenteric artery. An arteriogram was performed. There is preferential filling of the celiac axis. The 5 French catheter was then advanced into the superior mesenteric artery over a glidewire. Arteriography was performed. There is severe vaso constriction of the SMA and its branches. No convincing evidence of active arterial bleeding on the initial images. The C2 cobra catheter was then advanced into the common hepatic artery. Arteriography was performed. The dorsal pancreatic artery is patent and unremarkable. The stump of the GDA appears grossly unremarkable. Significant vaso constriction throughout the proper hepatic and intrahepatic arteries. There appears to be an accessory left gastric artery arising distally from the segment 2 hepatic artery. A renegade STC microcatheter was advanced over a Fathom 16 microwire into the gastroduodenal stump. Contrast injection was performed. The gastric stump is secure. No evidence of contrast extravasation. The microcatheter was removed. The 5 French catheter was brought back into the origin of the superior mesenteric artery. The microcatheter was reintroduced. The microcatheter was advanced into the origin of the middle colic artery. Contrast injection was performed demonstrating no evidence of active bleeding or arterial injury. The microcatheter was next advanced into the inferior pancreaticoduodenal artery and arteriography was performed. Again, no evidence of active bleeding or arterial injury. The microcatheter was advanced into the right middle colic artery and additional arteriography was performed. Again, no evidence of arterial injury or active bleeding. At  this point, all of the vessels in the region were successfully interrogated with no evidence of active arterial  bleeding. The procedure was terminated. The catheters and wires were removed. Hemostasis was attained with the assistance of a Celt arterial closure device. IMPRESSION: Multi selective visceral arteriography demonstrates no evidence of active arterial bleeding or vascular injury at this time. Electronically Signed   By: Jacqulynn Cadet M.D.   On: 09/15/2020 19:21   DG Chest Port 1 View  Result Date: 09/24/2020 CLINICAL DATA:  Endotracheal tube EXAM: PORTABLE CHEST 1 VIEW COMPARISON:  Chest x-ray 09/28/2020 FINDINGS: Interval retraction of an endotracheal tube with tip terminating 4.5 cm above the carina. Right internal jugular central venous catheter with tip terminating overlying the expected region of the distal superior vena cava. Enteric tube coursing below the hemidiaphragm with tip collimated off view and side port overlying the expected region of the gastric lumen. Left chest wall cardiac defibrillator in stable position. The heart size and mediastinal contours are unchanged. Persistent, slightly improved, bilateral patchy airspace opacities. No pulmonary edema. No pleural effusion. No pneumothorax. No acute osseous abnormality. IMPRESSION: 1. Persistent, slightly improved, bilateral patchy airspace opacities. 2. Lines and tubes in appropriate position. Electronically Signed   By: Iven Finn M.D.   On: 09/24/2020 04:51   DG CHEST PORT 1 VIEW  Result Date: 10/02/2020 CLINICAL DATA:  Intubated EXAM: PORTABLE CHEST 1 VIEW COMPARISON:  09/17/2020 FINDINGS: Endotracheal tube tip 2 cm above the carina. Orogastric or nasogastric tube enters the abdomen. Pacemaker defibrillator remains in place. Right internal jugular central line tip in the SVC 3 cm above the right atrium. Mild cardiomegaly as seen previously. Increased perihilar lung density, more on the right than the left. This could be  due to acute edema or aspiration. IMPRESSION: Increased perihilar lung density, more on the right than the left. This could be due to acute edema or aspiration. Lines and tubes well positioned. Electronically Signed   By: Nelson Chimes M.D.   On: 09/18/2020 12:25   DG CHEST PORT 1 VIEW  Result Date: 09/13/2020 CLINICAL DATA:  Central line placement, postop blood pool procedure. EXAM: PORTABLE CHEST 1 VIEW COMPARISON:  02/01/2014 FINDINGS: Right internal jugular sheath tip projects over the brachiocephalic SVC confluence. No pneumothorax. Enteric tube in place with tip and side-port below the diaphragm in the stomach. Single lead left-sided pacemaker in place. Lower lung volumes. Normal heart size for technique. No pleural fluid or pulmonary edema. No focal airspace disease. IMPRESSION: 1. Right internal jugular sheath tip projects over the brachiocephalic SVC confluence. No pneumothorax. 2. Enteric tube with tip and side-port below the diaphragm in the stomach. Electronically Signed   By: Keith Rake M.D.   On: 09/10/2020 18:08    Labs:  CBC: Recent Labs    09/14/2020 1947 10/07/2020 1953 09/20/2020 2153 09/24/20 0004 09/24/20 0400 09/24/20 0652  WBC 3.3*  --  4.8 4.2 2.3*  --   HGB 10.1*   < > 12.6* 12.6* 11.0* 9.2*  HCT 30.3*   < > 36.5* 35.7* 30.4* 27.0*  PLT 52*  52*  --  50* 69* 50*  --    < > = values in this interval not displayed.    COAGS: Recent Labs    09/21/2020 0413 09/13/2020 1100 10/01/2020 1228 09/19/2020 1947 09/24/20 0400  INR 2.6* 1.6* 1.5* 0.9 1.2  APTT 50* 40* 37* 35  --     BMP: Recent Labs    12/03/19 1703 05/24/20 1956 07/03/20 1643 07/21/20 0921 08/21/20 1037 10/08/2020 1228 10/04/2020 1535 09/30/2020 2153 09/24/20 0004 09/24/20 0400 09/24/20 0652  NA  144   < > 142 142   < > 146*   < > 144 145 146* 148*  K 4.6   < > 3.7 4.2   < > 5.2*   < > 4.6 4.3 3.9 3.8  CL 109*   < > 103 104   < > 111  --  108 108 107  --   CO2 21   < > 25 27   < > 18*  --  19* 20*  24  --   GLUCOSE 103*   < > 105* 83   < > 206*  --  134* 120* 101*  --   BUN 15   < > 18 16   < > 16  --  19 20 22*  --   CALCIUM 9.5   < > 9.1 9.5   < > 6.9*  --  8.6* 7.9* 8.0*  --   CREATININE 1.27   < > 0.96 1.19   < > 2.14*  --  2.32* 2.49* 2.61*  --   GFRNONAA 66   < > 92 71   < > 37*  --  34* 31* 29*  --   GFRAA 77  --  107 82  --   --   --   --   --   --   --    < > = values in this interval not displayed.    LIVER FUNCTION TESTS: Recent Labs    09/15/2020 0413 09/20/2020 1228 09/20/2020 2153 09/24/20 0400  BILITOT 1.1 1.6* 2.4* 2.5*  AST 64* 261* 409* 369*  ALT 38 101* 128* 104*  ALKPHOS 34* 34* 34* 40  PROT 4.3* 4.3* 4.1* 4.4*  ALBUMIN 3.0* 2.3* 2.4* 2.5*    Assessment and Plan:  History of neuroendocrine tumor of pancreas s/p Whipple procedure with hemicolectomy in OR 09/28/2020 by Dr. Zenia Resides; complicated by hemorrhagic shock requiring pressors despite multiple blood transfusions s/p emergent ex lap with abdominal washout, biliary T tube placement, and placement of negative pressure abdominal dressing; with continued hemorrhagic shock s/p mesenteric arteriogram via right femoral approach revealing no evidence of arterial bleeding (procedure was aborted). Patient's overall condition guarded- remains intubated/sedated, no longer on pressors, on MTP, possibly back to OR tomorrow per [facility]. Right femoral puncture site stable, distal pulses (DPs) 1+ bilaterally. Further plans per [facility]/CCM- appreciate and agree with management. Please call IR with questions/concerns.   Electronically Signed: Earley Abide, PA-C 09/24/2020, 10:04 AM   I spent a total of 15 Minutes at the the patient's bedside AND on the patient's hospital floor or unit, greater than 50% of which was counseling/coordinating care for GI bleed s/p mesenteric arteriogram.

## 2020-09-25 ENCOUNTER — Inpatient Hospital Stay (HOSPITAL_COMMUNITY): Payer: Managed Care, Other (non HMO) | Admitting: Anesthesiology

## 2020-09-25 ENCOUNTER — Inpatient Hospital Stay (HOSPITAL_COMMUNITY): Payer: Managed Care, Other (non HMO)

## 2020-09-25 ENCOUNTER — Encounter (HOSPITAL_COMMUNITY): Admission: RE | Disposition: E | Payer: Self-pay | Source: Home / Self Care | Attending: Surgery

## 2020-09-25 DIAGNOSIS — J9601 Acute respiratory failure with hypoxia: Secondary | ICD-10-CM | POA: Diagnosis not present

## 2020-09-25 HISTORY — PX: LAPAROTOMY: SHX154

## 2020-09-25 HISTORY — PX: APPLICATION OF WOUND VAC: SHX5189

## 2020-09-25 HISTORY — PX: BOWEL RESECTION: SHX1257

## 2020-09-25 LAB — TYPE AND SCREEN
ABO/RH(D): O NEG
Antibody Screen: NEGATIVE
Unit division: 0
Unit division: 0
Unit division: 0
Unit division: 0
Unit division: 0
Unit division: 0
Unit division: 0
Unit division: 0
Unit division: 0
Unit division: 0
Unit division: 0
Unit division: 0
Unit division: 0
Unit division: 0
Unit division: 0
Unit division: 0
Unit division: 0
Unit division: 0
Unit division: 0
Unit division: 0
Unit division: 0
Unit division: 0
Unit division: 0
Unit division: 0
Unit division: 0
Unit division: 0
Unit division: 0
Unit division: 0
Unit division: 0
Unit division: 0
Unit division: 0
Unit division: 0
Unit division: 0
Unit division: 0
Unit division: 0
Unit division: 0
Unit division: 0
Unit division: 0
Unit division: 0
Unit division: 0

## 2020-09-25 LAB — BPAM FFP
Blood Product Expiration Date: 202203172359
Blood Product Expiration Date: 202203172359
Blood Product Expiration Date: 202203182359
Blood Product Expiration Date: 202203192359
Blood Product Expiration Date: 202203192359
Blood Product Expiration Date: 202203202359
Blood Product Expiration Date: 202203202359
Blood Product Expiration Date: 202203202359
Blood Product Expiration Date: 202203202359
Blood Product Expiration Date: 202203202359
Blood Product Expiration Date: 202203202359
Blood Product Expiration Date: 202203202359
Blood Product Expiration Date: 202203202359
Blood Product Expiration Date: 202203202359
Blood Product Expiration Date: 202203202359
Blood Product Expiration Date: 202203202359
Blood Product Expiration Date: 202203202359
Blood Product Expiration Date: 202203202359
Blood Product Expiration Date: 202203202359
Blood Product Expiration Date: 202203202359
Blood Product Expiration Date: 202203202359
Blood Product Expiration Date: 202203202359
Blood Product Expiration Date: 202203202359
Blood Product Expiration Date: 202203202359
Blood Product Expiration Date: 202203202359
Blood Product Expiration Date: 202203202359
Blood Product Expiration Date: 202203202359
Blood Product Expiration Date: 202203202359
Blood Product Expiration Date: 202203202359
Blood Product Expiration Date: 202203202359
Blood Product Expiration Date: 202203202359
Blood Product Expiration Date: 202203202359
Blood Product Expiration Date: 202203202359
Blood Product Expiration Date: 202203202359
Blood Product Expiration Date: 202203202359
Blood Product Expiration Date: 202203202359
Blood Product Expiration Date: 202203202359
Blood Product Expiration Date: 202203202359
Blood Product Expiration Date: 202203202359
Blood Product Expiration Date: 202203202359
Blood Product Expiration Date: 202203202359
Blood Product Expiration Date: 202203202359
Blood Product Expiration Date: 202203202359
Blood Product Expiration Date: 202203202359
Blood Product Expiration Date: 202203202359
Blood Product Expiration Date: 202203202359
Blood Product Expiration Date: 202203202359
Blood Product Expiration Date: 202203202359
Blood Product Expiration Date: 202203212359
Blood Product Expiration Date: 202203212359
Blood Product Expiration Date: 202203212359
Blood Product Expiration Date: 202203212359
Blood Product Expiration Date: 202203212359
Blood Product Expiration Date: 202203212359
Blood Product Expiration Date: 202203212359
Blood Product Expiration Date: 202203212359
Blood Product Expiration Date: 202204022359
Blood Product Expiration Date: 202204042359
Blood Product Expiration Date: 202204042359
ISSUE DATE / TIME: 202203150621
ISSUE DATE / TIME: 202203150727
ISSUE DATE / TIME: 202203150727
ISSUE DATE / TIME: 202203150828
ISSUE DATE / TIME: 202203150828
ISSUE DATE / TIME: 202203150828
ISSUE DATE / TIME: 202203150828
ISSUE DATE / TIME: 202203150835
ISSUE DATE / TIME: 202203150835
ISSUE DATE / TIME: 202203150835
ISSUE DATE / TIME: 202203150855
ISSUE DATE / TIME: 202203150900
ISSUE DATE / TIME: 202203150900
ISSUE DATE / TIME: 202203150900
ISSUE DATE / TIME: 202203150900
ISSUE DATE / TIME: 202203150919
ISSUE DATE / TIME: 202203150919
ISSUE DATE / TIME: 202203150919
ISSUE DATE / TIME: 202203150919
ISSUE DATE / TIME: 202203150942
ISSUE DATE / TIME: 202203150942
ISSUE DATE / TIME: 202203150942
ISSUE DATE / TIME: 202203150942
ISSUE DATE / TIME: 202203151116
ISSUE DATE / TIME: 202203151116
ISSUE DATE / TIME: 202203151116
ISSUE DATE / TIME: 202203151116
ISSUE DATE / TIME: 202203151353
ISSUE DATE / TIME: 202203151353
ISSUE DATE / TIME: 202203151353
ISSUE DATE / TIME: 202203151353
ISSUE DATE / TIME: 202203151554
ISSUE DATE / TIME: 202203151554
ISSUE DATE / TIME: 202203151554
ISSUE DATE / TIME: 202203151554
ISSUE DATE / TIME: 202203151714
ISSUE DATE / TIME: 202203151714
ISSUE DATE / TIME: 202203151714
ISSUE DATE / TIME: 202203151714
ISSUE DATE / TIME: 202203151859
ISSUE DATE / TIME: 202203151859
ISSUE DATE / TIME: 202203151859
ISSUE DATE / TIME: 202203151859
ISSUE DATE / TIME: 202203152143
ISSUE DATE / TIME: 202203152143
ISSUE DATE / TIME: 202203152143
ISSUE DATE / TIME: 202203152143
ISSUE DATE / TIME: 202203160128
ISSUE DATE / TIME: 202203160128
ISSUE DATE / TIME: 202203160128
ISSUE DATE / TIME: 202203160128
ISSUE DATE / TIME: 202203160557
ISSUE DATE / TIME: 202203160557
ISSUE DATE / TIME: 202203160557
ISSUE DATE / TIME: 202203160557
Unit Type and Rh: 5100
Unit Type and Rh: 5100
Unit Type and Rh: 5100
Unit Type and Rh: 5100
Unit Type and Rh: 5100
Unit Type and Rh: 5100
Unit Type and Rh: 5100
Unit Type and Rh: 5100
Unit Type and Rh: 5100
Unit Type and Rh: 5100
Unit Type and Rh: 5100
Unit Type and Rh: 5100
Unit Type and Rh: 5100
Unit Type and Rh: 5100
Unit Type and Rh: 5100
Unit Type and Rh: 5100
Unit Type and Rh: 5100
Unit Type and Rh: 5100
Unit Type and Rh: 5100
Unit Type and Rh: 5100
Unit Type and Rh: 5100
Unit Type and Rh: 5100
Unit Type and Rh: 5100
Unit Type and Rh: 5100
Unit Type and Rh: 5100
Unit Type and Rh: 5100
Unit Type and Rh: 5100
Unit Type and Rh: 5100
Unit Type and Rh: 5100
Unit Type and Rh: 5100
Unit Type and Rh: 5100
Unit Type and Rh: 5100
Unit Type and Rh: 5100
Unit Type and Rh: 5100
Unit Type and Rh: 5100
Unit Type and Rh: 5100
Unit Type and Rh: 5100
Unit Type and Rh: 5100
Unit Type and Rh: 5100
Unit Type and Rh: 5100
Unit Type and Rh: 5100
Unit Type and Rh: 5100
Unit Type and Rh: 5100
Unit Type and Rh: 5100
Unit Type and Rh: 5100
Unit Type and Rh: 5100
Unit Type and Rh: 600
Unit Type and Rh: 6200
Unit Type and Rh: 6200
Unit Type and Rh: 6200
Unit Type and Rh: 6200
Unit Type and Rh: 6200
Unit Type and Rh: 6200
Unit Type and Rh: 6200
Unit Type and Rh: 6200
Unit Type and Rh: 9500
Unit Type and Rh: 9500
Unit Type and Rh: 9500
Unit Type and Rh: 9500

## 2020-09-25 LAB — BPAM PLATELET PHERESIS
Blood Product Expiration Date: 202203172359
Blood Product Expiration Date: 202203172359
Blood Product Expiration Date: 202203182359
Blood Product Expiration Date: 202203182359
Blood Product Expiration Date: 202203182359
ISSUE DATE / TIME: 202203152328
ISSUE DATE / TIME: 202203152328
ISSUE DATE / TIME: 202203160126
ISSUE DATE / TIME: 202203160604
ISSUE DATE / TIME: 202203160903
Unit Type and Rh: 5100
Unit Type and Rh: 6200
Unit Type and Rh: 6200
Unit Type and Rh: 7300
Unit Type and Rh: 7300

## 2020-09-25 LAB — BPAM RBC
Blood Product Expiration Date: 202204142359
Blood Product Expiration Date: 202204142359
Blood Product Expiration Date: 202204142359
Blood Product Expiration Date: 202204142359
Blood Product Expiration Date: 202204142359
Blood Product Expiration Date: 202204142359
Blood Product Expiration Date: 202204142359
Blood Product Expiration Date: 202204152359
Blood Product Expiration Date: 202204152359
Blood Product Expiration Date: 202204152359
Blood Product Expiration Date: 202204152359
Blood Product Expiration Date: 202204152359
Blood Product Expiration Date: 202204152359
Blood Product Expiration Date: 202204152359
Blood Product Expiration Date: 202204162359
Blood Product Expiration Date: 202204162359
Blood Product Expiration Date: 202204162359
Blood Product Expiration Date: 202204162359
Blood Product Expiration Date: 202204162359
Blood Product Expiration Date: 202204162359
Blood Product Expiration Date: 202204162359
Blood Product Expiration Date: 202204162359
Blood Product Expiration Date: 202204172359
Blood Product Expiration Date: 202204172359
Blood Product Expiration Date: 202204172359
Blood Product Expiration Date: 202204172359
Blood Product Expiration Date: 202204192359
Blood Product Expiration Date: 202204192359
Blood Product Expiration Date: 202204192359
Blood Product Expiration Date: 202204192359
Blood Product Expiration Date: 202204192359
Blood Product Expiration Date: 202204192359
Blood Product Expiration Date: 202204192359
Blood Product Expiration Date: 202204192359
Blood Product Expiration Date: 202204192359
Blood Product Expiration Date: 202204192359
Blood Product Expiration Date: 202204202359
Blood Product Expiration Date: 202204202359
Blood Product Expiration Date: 202204202359
Blood Product Expiration Date: 202204202359
ISSUE DATE / TIME: 202203150851
ISSUE DATE / TIME: 202203150851
ISSUE DATE / TIME: 202203150851
ISSUE DATE / TIME: 202203150851
ISSUE DATE / TIME: 202203150857
ISSUE DATE / TIME: 202203150857
ISSUE DATE / TIME: 202203150857
ISSUE DATE / TIME: 202203150857
ISSUE DATE / TIME: 202203150905
ISSUE DATE / TIME: 202203150905
ISSUE DATE / TIME: 202203150905
ISSUE DATE / TIME: 202203150905
ISSUE DATE / TIME: 202203150932
ISSUE DATE / TIME: 202203150932
ISSUE DATE / TIME: 202203150932
ISSUE DATE / TIME: 202203150932
ISSUE DATE / TIME: 202203151515
ISSUE DATE / TIME: 202203151515
ISSUE DATE / TIME: 202203151515
ISSUE DATE / TIME: 202203151515
ISSUE DATE / TIME: 202203151554
ISSUE DATE / TIME: 202203151554
ISSUE DATE / TIME: 202203151554
ISSUE DATE / TIME: 202203151554
ISSUE DATE / TIME: 202203151714
ISSUE DATE / TIME: 202203151714
ISSUE DATE / TIME: 202203151714
ISSUE DATE / TIME: 202203151714
ISSUE DATE / TIME: 202203151859
ISSUE DATE / TIME: 202203151859
ISSUE DATE / TIME: 202203151859
ISSUE DATE / TIME: 202203151859
ISSUE DATE / TIME: 202203152143
ISSUE DATE / TIME: 202203152143
ISSUE DATE / TIME: 202203152143
ISSUE DATE / TIME: 202203152143
ISSUE DATE / TIME: 202203160128
ISSUE DATE / TIME: 202203160128
ISSUE DATE / TIME: 202203160600
ISSUE DATE / TIME: 202203160600
Unit Type and Rh: 5100
Unit Type and Rh: 5100
Unit Type and Rh: 5100
Unit Type and Rh: 5100
Unit Type and Rh: 5100
Unit Type and Rh: 5100
Unit Type and Rh: 5100
Unit Type and Rh: 5100
Unit Type and Rh: 5100
Unit Type and Rh: 5100
Unit Type and Rh: 5100
Unit Type and Rh: 5100
Unit Type and Rh: 5100
Unit Type and Rh: 5100
Unit Type and Rh: 5100
Unit Type and Rh: 5100
Unit Type and Rh: 5100
Unit Type and Rh: 5100
Unit Type and Rh: 5100
Unit Type and Rh: 5100
Unit Type and Rh: 5100
Unit Type and Rh: 5100
Unit Type and Rh: 5100
Unit Type and Rh: 5100
Unit Type and Rh: 5100
Unit Type and Rh: 5100
Unit Type and Rh: 5100
Unit Type and Rh: 5100
Unit Type and Rh: 5100
Unit Type and Rh: 5100
Unit Type and Rh: 5100
Unit Type and Rh: 5100
Unit Type and Rh: 5100
Unit Type and Rh: 5100
Unit Type and Rh: 5100
Unit Type and Rh: 5100
Unit Type and Rh: 5100
Unit Type and Rh: 5100
Unit Type and Rh: 5100
Unit Type and Rh: 5100

## 2020-09-25 LAB — PREPARE FRESH FROZEN PLASMA
Unit division: 0
Unit division: 0
Unit division: 0
Unit division: 0
Unit division: 0
Unit division: 0
Unit division: 0
Unit division: 0
Unit division: 0
Unit division: 0
Unit division: 0
Unit division: 0
Unit division: 0
Unit division: 0
Unit division: 0
Unit division: 0

## 2020-09-25 LAB — BPAM CRYOPRECIPITATE
Blood Product Expiration Date: 202203160724
Blood Product Expiration Date: 202203161145
Blood Product Expiration Date: 202203161240
ISSUE DATE / TIME: 202203160146
ISSUE DATE / TIME: 202203160603
ISSUE DATE / TIME: 202203160903
Unit Type and Rh: 5100
Unit Type and Rh: 5100
Unit Type and Rh: 5100

## 2020-09-25 LAB — POCT I-STAT 7, (LYTES, BLD GAS, ICA,H+H)
Acid-Base Excess: 4 mmol/L — ABNORMAL HIGH (ref 0.0–2.0)
Acid-Base Excess: 4 mmol/L — ABNORMAL HIGH (ref 0.0–2.0)
Bicarbonate: 27.5 mmol/L (ref 20.0–28.0)
Bicarbonate: 28.6 mmol/L — ABNORMAL HIGH (ref 20.0–28.0)
Calcium, Ion: 0.97 mmol/L — ABNORMAL LOW (ref 1.15–1.40)
Calcium, Ion: 1 mmol/L — ABNORMAL LOW (ref 1.15–1.40)
HCT: 25 % — ABNORMAL LOW (ref 39.0–52.0)
HCT: 31 % — ABNORMAL LOW (ref 39.0–52.0)
Hemoglobin: 8.5 g/dL — ABNORMAL LOW (ref 13.0–17.0)
Hemoglobin: 10.5 g/dL — ABNORMAL LOW (ref 13.0–17.0)
O2 Saturation: 96 %
O2 Saturation: 96 %
Potassium: 3.2 mmol/L — ABNORMAL LOW (ref 3.5–5.1)
Potassium: 3.5 mmol/L (ref 3.5–5.1)
Sodium: 146 mmol/L — ABNORMAL HIGH (ref 135–145)
Sodium: 146 mmol/L — ABNORMAL HIGH (ref 135–145)
TCO2: 29 mmol/L (ref 22–32)
TCO2: 30 mmol/L (ref 22–32)
pCO2 arterial: 37 mmHg (ref 32.0–48.0)
pCO2 arterial: 42.4 mmHg (ref 32.0–48.0)
pH, Arterial: 7.478 — ABNORMAL HIGH (ref 7.350–7.450)
pH, Arterial: 7.437 (ref 7.350–7.450)
pO2, Arterial: 75 mmHg — ABNORMAL LOW (ref 83.0–108.0)
pO2, Arterial: 79 mmHg — ABNORMAL LOW (ref 83.0–108.0)

## 2020-09-25 LAB — DIFFERENTIAL
Abs Immature Granulocytes: 0.06 10*3/uL (ref 0.00–0.07)
Basophils Absolute: 0 10*3/uL (ref 0.0–0.1)
Basophils Relative: 0 %
Eosinophils Absolute: 0 10*3/uL (ref 0.0–0.5)
Eosinophils Relative: 0 %
Immature Granulocytes: 1 %
Lymphocytes Relative: 8 %
Lymphs Abs: 0.5 10*3/uL — ABNORMAL LOW (ref 0.7–4.0)
Monocytes Absolute: 0.6 10*3/uL (ref 0.1–1.0)
Monocytes Relative: 9 %
Neutro Abs: 5.2 10*3/uL (ref 1.7–7.7)
Neutrophils Relative %: 82 %

## 2020-09-25 LAB — COMPREHENSIVE METABOLIC PANEL
ALT: 134 U/L — ABNORMAL HIGH (ref 0–44)
AST: 205 U/L — ABNORMAL HIGH (ref 15–41)
Albumin: 2.3 g/dL — ABNORMAL LOW (ref 3.5–5.0)
Alkaline Phosphatase: 57 U/L (ref 38–126)
Anion gap: 14 (ref 5–15)
BUN: 34 mg/dL — ABNORMAL HIGH (ref 6–20)
CO2: 25 mmol/L (ref 22–32)
Calcium: 7.7 mg/dL — ABNORMAL LOW (ref 8.9–10.3)
Chloride: 106 mmol/L (ref 98–111)
Creatinine, Ser: 3.1 mg/dL — ABNORMAL HIGH (ref 0.61–1.24)
GFR, Estimated: 24 mL/min — ABNORMAL LOW (ref >60)
Glucose, Bld: 160 mg/dL — ABNORMAL HIGH (ref 70–99)
Potassium: 3.2 mmol/L — ABNORMAL LOW (ref 3.5–5.1)
Sodium: 145 mmol/L (ref 135–145)
Total Bilirubin: 3.7 mg/dL — ABNORMAL HIGH (ref 0.3–1.2)
Total Protein: 4.7 g/dL — ABNORMAL LOW (ref 6.5–8.1)

## 2020-09-25 LAB — CBC
HCT: 32.6 % — ABNORMAL LOW (ref 39.0–52.0)
Hemoglobin: 11.7 g/dL — ABNORMAL LOW (ref 13.0–17.0)
MCH: 30.4 pg (ref 26.0–34.0)
MCHC: 35.9 g/dL (ref 30.0–36.0)
MCV: 84.7 fL (ref 80.0–100.0)
Platelets: 96 10*3/uL — ABNORMAL LOW (ref 150–400)
RBC: 3.85 MIL/uL — ABNORMAL LOW (ref 4.22–5.81)
RDW: 16.2 % — ABNORMAL HIGH (ref 11.5–15.5)
WBC: 6.4 10*3/uL (ref 4.0–10.5)
nRBC: 3.1 % — ABNORMAL HIGH (ref 0.0–0.2)

## 2020-09-25 LAB — PREPARE PLATELET PHERESIS
Unit division: 0
Unit division: 0
Unit division: 0
Unit division: 0
Unit division: 0

## 2020-09-25 LAB — PHOSPHORUS: Phosphorus: 6.4 mg/dL — ABNORMAL HIGH (ref 2.5–4.6)

## 2020-09-25 LAB — TRAUMA TEG PANEL
CFF Max Amplitude: 43.4 mm — ABNORMAL HIGH (ref 15–32)
Citrated Kaolin (R): 5.2 min (ref 4.6–9.1)
Citrated Rapid TEG (MA): 67.1 mm (ref 52–70)
Lysis at 30 Minutes: 0 % (ref 0.0–2.6)

## 2020-09-25 LAB — PREPARE CRYOPRECIPITATE
Unit division: 0
Unit division: 0
Unit division: 0

## 2020-09-25 LAB — GLUCOSE, CAPILLARY
Glucose-Capillary: 111 mg/dL — ABNORMAL HIGH (ref 70–99)
Glucose-Capillary: 113 mg/dL — ABNORMAL HIGH (ref 70–99)
Glucose-Capillary: 115 mg/dL — ABNORMAL HIGH (ref 70–99)
Glucose-Capillary: 121 mg/dL — ABNORMAL HIGH (ref 70–99)
Glucose-Capillary: 128 mg/dL — ABNORMAL HIGH (ref 70–99)
Glucose-Capillary: 139 mg/dL — ABNORMAL HIGH (ref 70–99)

## 2020-09-25 LAB — PREALBUMIN: Prealbumin: 11.8 mg/dL — ABNORMAL LOW (ref 18–38)

## 2020-09-25 LAB — HEPARIN INDUCED PLATELET AB (HIT ANTIBODY): Heparin Induced Plt Ab: 0.127 OD (ref 0.000–0.400)

## 2020-09-25 LAB — LACTIC ACID, PLASMA: Lactic Acid, Venous: 3.7 mmol/L (ref 0.5–1.9)

## 2020-09-25 LAB — TRIGLYCERIDES: Triglycerides: 591 mg/dL — ABNORMAL HIGH (ref <150)

## 2020-09-25 LAB — SURGICAL PATHOLOGY

## 2020-09-25 LAB — BILIRUBIN, DIRECT: Bilirubin, Direct: 2.1 mg/dL — ABNORMAL HIGH (ref 0.0–0.2)

## 2020-09-25 LAB — MAGNESIUM
Magnesium: 2.3 mg/dL (ref 1.7–2.4)
Magnesium: 1.9 mg/dL (ref 1.7–2.4)

## 2020-09-25 SURGERY — LAPAROTOMY, EXPLORATORY
Anesthesia: General

## 2020-09-25 MED ORDER — SODIUM CHLORIDE 0.9 % IV SOLN
2.0000 g | INTRAVENOUS | Status: DC
  Administered 2020-09-25 – 2020-09-28 (×4): 2 g via INTRAVENOUS
  Filled 2020-09-25 (×4): qty 2
  Filled 2020-09-25: qty 20

## 2020-09-25 MED ORDER — ALBUMIN HUMAN 5 % IV SOLN: INTRAVENOUS | Status: DC | PRN

## 2020-09-25 MED ORDER — SODIUM CHLORIDE (PF) 0.9 % IJ SOLN
INTRAMUSCULAR | Status: AC
  Filled 2020-09-25: qty 10

## 2020-09-25 MED ORDER — SODIUM CHLORIDE 0.9 % IV SOLN
2.0000 g | INTRAVENOUS | Status: DC
  Filled 2020-09-25: qty 20

## 2020-09-25 MED ORDER — CALCIUM GLUCONATE-NACL 2-0.675 GM/100ML-% IV SOLN
2.0000 g | Freq: Once | INTRAVENOUS | Status: AC
  Administered 2020-09-25: 2000 mg via INTRAVENOUS
  Filled 2020-09-25: qty 100

## 2020-09-25 MED ORDER — ROCURONIUM BROMIDE 10 MG/ML (PF) SYRINGE
PREFILLED_SYRINGE | INTRAVENOUS | Status: DC | PRN
  Administered 2020-09-25: 50 mg via INTRAVENOUS

## 2020-09-25 MED ORDER — MIDAZOLAM HCL 2 MG/2ML IJ SOLN
INTRAMUSCULAR | Status: DC | PRN
  Administered 2020-09-25: 2 mg via INTRAVENOUS

## 2020-09-25 MED ORDER — LACTATED RINGERS IV BOLUS
1000.0000 mL | Freq: Once | INTRAVENOUS | Status: AC
  Administered 2020-09-25: 1000 mL via INTRAVENOUS

## 2020-09-25 MED ORDER — MIDAZOLAM HCL 2 MG/2ML IJ SOLN
INTRAMUSCULAR | Status: AC
  Filled 2020-09-25: qty 2

## 2020-09-25 MED ORDER — ARTIFICIAL TEARS OPHTHALMIC OINT
TOPICAL_OINTMENT | OPHTHALMIC | Status: AC
  Filled 2020-09-25: qty 3.5

## 2020-09-25 MED ORDER — METRONIDAZOLE IN NACL 5-0.79 MG/ML-% IV SOLN
500.0000 mg | Freq: Three times a day (TID) | INTRAVENOUS | Status: DC
  Administered 2020-09-25 – 2020-09-29 (×11): 500 mg via INTRAVENOUS
  Filled 2020-09-25 (×12): qty 100

## 2020-09-25 MED ORDER — DEXTROSE-NACL 5-0.45 % IV SOLN: INTRAVENOUS | Status: DC

## 2020-09-25 MED ORDER — DEXMEDETOMIDINE HCL IN NACL 400 MCG/100ML IV SOLN
0.4000 ug/kg/h | INTRAVENOUS | Status: DC
  Administered 2020-09-25: 0.7 ug/kg/h via INTRAVENOUS
  Administered 2020-09-25: 0.6 ug/kg/h via INTRAVENOUS
  Administered 2020-09-25: 0.4 ug/kg/h via INTRAVENOUS
  Administered 2020-09-26: 1.2 ug/kg/h via INTRAVENOUS
  Administered 2020-09-26: 1.1 ug/kg/h via INTRAVENOUS
  Administered 2020-09-26 (×2): 1 ug/kg/h via INTRAVENOUS
  Administered 2020-09-26: 1.2 ug/kg/h via INTRAVENOUS
  Administered 2020-09-27 (×5): 1 ug/kg/h via INTRAVENOUS
  Administered 2020-09-28: 1.1 ug/kg/h via INTRAVENOUS
  Administered 2020-09-28: 1 ug/kg/h via INTRAVENOUS
  Filled 2020-09-25 (×17): qty 100

## 2020-09-25 MED ORDER — 0.9 % SODIUM CHLORIDE (POUR BTL) OPTIME
TOPICAL | Status: DC | PRN
  Administered 2020-09-25: 7000 mL
  Administered 2020-09-25: 2000 mL

## 2020-09-25 MED ORDER — LACTATED RINGERS IV SOLN: INTRAVENOUS | Status: DC | PRN

## 2020-09-25 MED ORDER — LACTATED RINGERS IV SOLN: INTRAVENOUS | Status: DC

## 2020-09-25 MED ORDER — TRACE MINERALS CU-MN-SE-ZN 300-55-60-3000 MCG/ML IV SOLN
INTRAVENOUS | Status: AC
  Filled 2020-09-25: qty 568.8

## 2020-09-25 MED ORDER — CALCIUM CHLORIDE 10 % IV SOLN
INTRAVENOUS | Status: AC
  Filled 2020-09-25: qty 10

## 2020-09-25 MED ORDER — TRACE MINERALS CU-MN-SE-ZN 300-55-60-3000 MCG/ML IV SOLN: INTRAVENOUS | Status: DC

## 2020-09-25 MED ORDER — CALCIUM CHLORIDE 10 % IV SOLN
INTRAVENOUS | Status: DC | PRN
  Administered 2020-09-25 (×2): 250 mg via INTRAVENOUS

## 2020-09-25 MED ORDER — PHENYLEPHRINE HCL (PRESSORS) 10 MG/ML IV SOLN
INTRAVENOUS | Status: DC | PRN
  Administered 2020-09-25: 80 ug via INTRAVENOUS

## 2020-09-25 MED ORDER — METRONIDAZOLE IN NACL 5-0.79 MG/ML-% IV SOLN
500.0000 mg | Freq: Three times a day (TID) | INTRAVENOUS | Status: DC
  Administered 2020-09-25: 500 mg via INTRAVENOUS
  Filled 2020-09-25: qty 100

## 2020-09-25 MED ORDER — PROPOFOL 10 MG/ML IV BOLUS
INTRAVENOUS | Status: AC
  Filled 2020-09-25: qty 20

## 2020-09-25 MED ORDER — LACTATED RINGERS IV BOLUS
500.0000 mL | Freq: Once | INTRAVENOUS | Status: AC
  Administered 2020-09-25: 500 mL via INTRAVENOUS

## 2020-09-25 MED ORDER — ROCURONIUM BROMIDE 10 MG/ML (PF) SYRINGE
PREFILLED_SYRINGE | INTRAVENOUS | Status: AC
  Filled 2020-09-25: qty 10

## 2020-09-25 MED ORDER — POTASSIUM CHLORIDE 10 MEQ/50ML IV SOLN
10.0000 meq | INTRAVENOUS | Status: AC
  Administered 2020-09-25 (×4): 10 meq via INTRAVENOUS
  Filled 2020-09-25 (×4): qty 50

## 2020-09-25 MED ORDER — VANCOMYCIN HCL 10 G IV SOLR
2250.0000 mg | INTRAVENOUS | Status: DC
  Filled 2020-09-25: qty 2250

## 2020-09-25 SURGICAL SUPPLY — 60 items
ADH SKN CLS APL DERMABOND .7 (GAUZE/BANDAGES/DRESSINGS)
APL PRP STRL LF DISP 70% ISPRP (MISCELLANEOUS) ×2
APL SKNCLS STERI-STRIP NONHPOA (GAUZE/BANDAGES/DRESSINGS) ×4
BENZOIN TINCTURE PRP APPL 2/3 (GAUZE/BANDAGES/DRESSINGS) ×4 IMPLANT
BLADE CLIPPER SURG (BLADE) IMPLANT
CANISTER SUCT 3000ML PPV (MISCELLANEOUS) ×4 IMPLANT
CANISTER WOUNDNEG PRESSURE 500 (CANNISTER) ×2 IMPLANT
CHLORAPREP W/TINT 26 (MISCELLANEOUS) ×4 IMPLANT
COVER SURGICAL LIGHT HANDLE (MISCELLANEOUS) ×4 IMPLANT
COVER WAND RF STERILE (DRAPES) ×2 IMPLANT
DERMABOND ADVANCED (GAUZE/BANDAGES/DRESSINGS)
DERMABOND ADVANCED .7 DNX12 (GAUZE/BANDAGES/DRESSINGS) ×4 IMPLANT
DRAPE INCISE IOBAN 66X45 STRL (DRAPES) ×2 IMPLANT
DRAPE LAPAROSCOPIC ABDOMINAL (DRAPES) ×4 IMPLANT
DRAPE WARM FLUID 44X44 (DRAPES) ×4 IMPLANT
DRESSING MEPILEX FLEX 4X4 (GAUZE/BANDAGES/DRESSINGS) IMPLANT
DRSG MEPILEX FLEX 4X4 (GAUZE/BANDAGES/DRESSINGS) ×4
DRSG MEPITEL 4X7.2 (GAUZE/BANDAGES/DRESSINGS) ×2 IMPLANT
DRSG OPSITE POSTOP 4X10 (GAUZE/BANDAGES/DRESSINGS) IMPLANT
DRSG OPSITE POSTOP 4X8 (GAUZE/BANDAGES/DRESSINGS) IMPLANT
ELECT BLADE 6.5 EXT (BLADE) IMPLANT
ELECT CAUTERY BLADE 6.4 (BLADE) ×4 IMPLANT
ELECT REM PT RETURN 9FT ADLT (ELECTROSURGICAL) ×4
ELECTRODE REM PT RTRN 9FT ADLT (ELECTROSURGICAL) ×2 IMPLANT
GAUZE SPONGE 4X4 12PLY STRL (GAUZE/BANDAGES/DRESSINGS) ×2 IMPLANT
GLOVE BIOGEL PI IND STRL 6 (GLOVE) ×2 IMPLANT
GLOVE BIOGEL PI INDICATOR 6 (GLOVE) ×2
GLOVE BIOGEL PI MICRO 5.5 (GLOVE) ×2
GLOVE BIOGEL PI MICRO STRL 5.5 (GLOVE) ×2 IMPLANT
GOWN STRL REUS W/ TWL LRG LVL3 (GOWN DISPOSABLE) ×4 IMPLANT
GOWN STRL REUS W/TWL LRG LVL3 (GOWN DISPOSABLE) ×12
HANDLE SUCTION POOLE (INSTRUMENTS) ×2 IMPLANT
KIT BASIN OR (CUSTOM PROCEDURE TRAY) ×4 IMPLANT
KIT TURNOVER KIT B (KITS) ×4 IMPLANT
LIGASURE IMPACT 36 18CM CVD LR (INSTRUMENTS) ×2 IMPLANT
NS IRRIG 1000ML POUR BTL (IV SOLUTION) ×8 IMPLANT
PACK GENERAL/GYN (CUSTOM PROCEDURE TRAY) ×4 IMPLANT
PAD ARMBOARD 7.5X6 YLW CONV (MISCELLANEOUS) ×4 IMPLANT
PENCIL SMOKE EVACUATOR (MISCELLANEOUS) ×4 IMPLANT
RELOAD PROXIMATE 75MM BLUE (ENDOMECHANICALS) ×4 IMPLANT
RELOAD STAPLE 75 3.8 BLU REG (ENDOMECHANICALS) IMPLANT
SLEEVE SUCTION CATH 165 (SLEEVE) ×2 IMPLANT
SPECIMEN JAR LARGE (MISCELLANEOUS) IMPLANT
SPONGE ABDOMINAL VAC ABTHERA (MISCELLANEOUS) ×2 IMPLANT
SPONGE LAP 18X18 RF (DISPOSABLE) ×2 IMPLANT
STAPLER PROXIMATE 75MM BLUE (STAPLE) ×2 IMPLANT
STAPLER VISISTAT 35W (STAPLE) IMPLANT
SUCTION POOLE HANDLE (INSTRUMENTS) ×4
SUT MNCRL AB 4-0 PS2 18 (SUTURE) ×4 IMPLANT
SUT PDS AB 1 TP1 96 (SUTURE) ×4 IMPLANT
SUT SILK 2 0 SH CR/8 (SUTURE) ×4 IMPLANT
SUT SILK 2 0 TIES 10X30 (SUTURE) ×4 IMPLANT
SUT SILK 3 0 SH CR/8 (SUTURE) ×4 IMPLANT
SUT SILK 3 0 TIES 10X30 (SUTURE) ×4 IMPLANT
SUT VIC AB 3-0 SH 18 (SUTURE) IMPLANT
SUT VIC AB 3-0 SH 27 (SUTURE)
SUT VIC AB 3-0 SH 27XBRD (SUTURE) ×4 IMPLANT
TOWEL GREEN STERILE (TOWEL DISPOSABLE) ×4 IMPLANT
TRAY FOLEY MTR SLVR 16FR STAT (SET/KITS/TRAYS/PACK) IMPLANT
YANKAUER SUCT BULB TIP NO VENT (SUCTIONS) IMPLANT

## 2020-09-25 NOTE — Progress Notes (Signed)
Inpatient Diabetes Program Recommendations  AACE/ADA: New Consensus Statement on Inpatient Glycemic Control (2015)  Target Ranges:  Prepandial:   less than 140 mg/dL      Peak postprandial:   less than 180 mg/dL (1-2 hours)      Critically ill patients:  140 - 180 mg/dL   Lab Results  Component Value Date   GLUCAP 121 (H) 09/21/2020   HGBA1C 5.2 10/09/2020    Review of Glycemic Control Results for DEEN, DEGUIA (MRN [id number]) as of 09/10/2020 11:02  Ref. Range 09/24/2020 19:57 09/24/2020 20:29 09/24/2020 23:42 09/09/2020 03:39 10/03/2020 07:59  Glucose-Capillary Latest Ref Range: 70 - 99 mg/dL 61 (L) 96 120 (H) 115 (H) 121 (H)   Current orders for Inpatient glycemic control: Novolog 0-15 units Q4H   Inpatient Diabetes Program Recommendations:     Novlog 0-9 units Q4H.  Will continue to follow while inpatient.  Thank you, Reche Dixon, RN, BSN Diabetes Coordinator Inpatient Diabetes Program [phone] (team pager from 8a-5p)

## 2020-09-25 NOTE — Progress Notes (Signed)
2 Days Post-Op  Subjective: Patient is on low dose epi and vaso this morning. UOP has been 30 ml/hr overnight, Cr up to 3.1. Vac output is serosanguinous. NG and T tube output have dark blood. Hgb remains stable at 11.   Objective: Vital signs in last 24 hours: Temp:  [96.62 F (35.9 C)-99.68 F (37.6 C)] 99.5 F (37.5 C) (03/17 0500) Pulse Rate:  [54-82] 78 (03/17 0500) Resp:  [17-24] 19 (03/17 0500) BP: (79-116)/(49-74) 84/57 (03/17 0500) SpO2:  [97 %-100 %] 98 % (03/17 0500) Arterial Line BP: (93-130)/(41-57) 102/49 (03/17 0300) FiO2 (%):  [40 %] 40 % (03/17 0320) Last BM Date:  (pta)  Intake/Output from previous day: 03/16 0701 - 03/17 0700 In: 1772.1 [I.V.:1281.1; Blood:301; IV Piggyback:190] Out: 9528 [Urine:690; Emesis/NG output:540; Drains:2005; Stool:30] Intake/Output this shift: Total I/O In: 739 [I.V.:739] Out: 1655 [Urine:375; Emesis/NG output:490; Drains:760; Stool:30]  PE: General: intubated, anasarca HEENT: NG in place, clamped. OG to suction draining blood-tinged fluid. ETT in place. Cordis in place R neck. CV: RRR Resp: intubated, on vent Abdomen: Distended but soft. Abthera in place with thin serosanguinous drainage.  LUQ pancreatic stent with slightly blood-tinged fluid. RUQ T tube with dark bloody fluid. RLQ ileostomy is dusky with dark bloody fluid in bag. GU: foley in place, draining concentrated urine  Lab Results:  Recent Labs    09/24/20 1650 09/19/2020 0427  WBC 3.9* 6.4  HGB 11.5* 11.7*  HCT 32.1* 32.6*  PLT 69*  68* 96*   BMET Recent Labs    09/24/20 1219 09/24/2020 0427  NA 146* 145  K 3.7 3.2*  CL 106 106  CO2 27 25  GLUCOSE 89 160*  BUN 25* 34*  CREATININE 2.70* 3.10*  CALCIUM 8.2* 7.7*   PT/INR Recent Labs    09/24/20 0400 09/24/20 1650  LABPROT 14.6 16.8*  INR 1.2 1.4*   CMP     Component Value Date/Time   NA 145 09/26/2020 0427   NA 142 07/21/2020 0921   K 3.2 (L) 10/04/2020 0427   CL 106 10/05/2020 0427    CO2 25 09/09/2020 0427   GLUCOSE 160 (H) 09/12/2020 0427   BUN 34 (H) 09/09/2020 0427   BUN 16 07/21/2020 0921   CREATININE 3.10 (H) 10/04/2020 0427   CREATININE 1.16 03/01/2016 1607   CALCIUM 7.7 (L) 10/04/2020 0427   PROT 4.7 (L) 10/04/2020 0427   PROT 6.7 07/21/2020 0921   ALBUMIN 2.3 (L) 09/18/2020 0427   ALBUMIN 4.1 07/21/2020 0921   AST 205 (H) 10/08/2020 0427   ALT 134 (H) 10/04/2020 0427   ALKPHOS 57 10/02/2020 0427   BILITOT 3.7 (H) 09/24/2020 0427   BILITOT 0.8 07/21/2020 0921   GFRNONAA 24 (L) 09/16/2020 0427   GFRNONAA >89 09/22/2015 1008   GFRAA 82 07/21/2020 0921   GFRAA >89 09/22/2015 1008   Lipase     Component Value Date/Time   LIPASE 23 08/22/2020 0318       Studies/Results: IR Angiogram Visceral Selective  Result Date: 10/06/2020 Criselda Peaches, MD     10/08/2020  7:04 PM Interventional Radiology Procedure Note Procedure: Multi-selective visceral angiogram.  No evidence of arterial bleeding. Complications: None Estimated Blood Loss: None Recommendations: - Continue to transfuse as needed - Right common femoral arteriotomy closed with Celt Signed, Criselda Peaches, MD   IR Angiogram Selective Each Additional Vessel  Result Date: 09/11/2020 INDICATION: 50 year old male with severe postoperative bleeding following Whipple procedure. He was taken back to the operating  room for abdominal washout and no definitive arterial source for bleeding was identified. However, the patient continues to lose blood requiring multi unit transfusion. He presents to interventional radiology for visceral arteriography and possible embolization if a bleeding source can be identified. EXAM: SELECTIVE VISCERAL ARTERIOGRAPHY; ADDITIONAL ARTERIOGRAPHY MEDICATIONS: None ANESTHESIA/SEDATION: Provided by general anesthesia CONTRAST:  50mL OMNIPAQUE IOHEXOL 300 MG/ML SOLN, 43mL OMNIPAQUE IOHEXOL 300 MG/ML SOLN FLUOROSCOPY TIME:  Fluoroscopy Time: 7 minutes 42 seconds (1742 mGy).  COMPLICATIONS: None immediate. PROCEDURE: Informed consent was obtained from the patient following explanation of the procedure, risks, benefits and alternatives. The patient understands, agrees and consents for the procedure. All questions were addressed. A time out was performed prior to the initiation of the procedure. Maximal barrier sterile technique utilized including caps, mask, sterile gowns, sterile gloves, large sterile drape, hand hygiene, and Betadine prep. The right common femoral artery was interrogated with ultrasound and found to be widely patent. An image was obtained and stored for the medical record. Local anesthesia was attained by infiltration with 1% lidocaine. A small dermatotomy was made. Under real-time sonographic guidance, the vessel was punctured with a 21 gauge micropuncture needle. Using standard technique, the initial micro needle was exchanged over a 0.018 micro wire for a transitional 4 [place] micro sheath. The micro sheath was then exchanged over a 0.035 wire for a 5 French vascular sheath. A C2 cobra catheter was advanced over a Bentson wire and used to select the common trunk of the celiac artery and superior mesenteric artery. An arteriogram was performed. There is preferential filling of the celiac axis. The 5 French catheter was then advanced into the superior mesenteric artery over a glidewire. Arteriography was performed. There is severe vaso constriction of the SMA and its branches. No convincing evidence of active arterial bleeding on the initial images. The C2 cobra catheter was then advanced into the common hepatic artery. Arteriography was performed. The dorsal pancreatic artery is patent and unremarkable. The stump of the GDA appears grossly unremarkable. Significant vaso constriction throughout the proper hepatic and intrahepatic arteries. There appears to be an accessory left gastric artery arising distally from the segment 2 hepatic artery. A renegade STC  microcatheter was advanced over a Fathom 16 microwire into the gastroduodenal stump. Contrast injection was performed. The gastric stump is secure. No evidence of contrast extravasation. The microcatheter was removed. The 5 French catheter was brought back into the origin of the superior mesenteric artery. The microcatheter was reintroduced. The microcatheter was advanced into the origin of the middle colic artery. Contrast injection was performed demonstrating no evidence of active bleeding or arterial injury. The microcatheter was next advanced into the inferior pancreaticoduodenal artery and arteriography was performed. Again, no evidence of active bleeding or arterial injury. The microcatheter was advanced into the right middle colic artery and additional arteriography was performed. Again, no evidence of arterial injury or active bleeding. At this point, all of the vessels in the region were successfully interrogated with no evidence of active arterial bleeding. The procedure was terminated. The catheters and wires were removed. Hemostasis was attained with the assistance of a Celt arterial closure device. IMPRESSION: Multi selective visceral arteriography demonstrates no evidence of active arterial bleeding or vascular injury at this time. Electronically Signed   By: Jacqulynn Cadet M.D.   On: 09/17/2020 19:21   IR Angiogram Selective Each Additional Vessel  Result Date: 09/11/2020 INDICATION: 50 year old male with severe postoperative bleeding following Whipple procedure. He was taken back to the operating room  for abdominal washout and no definitive arterial source for bleeding was identified. However, the patient continues to lose blood requiring multi unit transfusion. He presents to interventional radiology for visceral arteriography and possible embolization if a bleeding source can be identified. EXAM: SELECTIVE VISCERAL ARTERIOGRAPHY; ADDITIONAL ARTERIOGRAPHY MEDICATIONS: None  ANESTHESIA/SEDATION: Provided by general anesthesia CONTRAST:  23mL OMNIPAQUE IOHEXOL 300 MG/ML SOLN, 30mL OMNIPAQUE IOHEXOL 300 MG/ML SOLN FLUOROSCOPY TIME:  Fluoroscopy Time: 7 minutes 42 seconds (1742 mGy). COMPLICATIONS: None immediate. PROCEDURE: Informed consent was obtained from the patient following explanation of the procedure, risks, benefits and alternatives. The patient understands, agrees and consents for the procedure. All questions were addressed. A time out was performed prior to the initiation of the procedure. Maximal barrier sterile technique utilized including caps, mask, sterile gowns, sterile gloves, large sterile drape, hand hygiene, and Betadine prep. The right common femoral artery was interrogated with ultrasound and found to be widely patent. An image was obtained and stored for the medical record. Local anesthesia was attained by infiltration with 1% lidocaine. A small dermatotomy was made. Under real-time sonographic guidance, the vessel was punctured with a 21 gauge micropuncture needle. Using standard technique, the initial micro needle was exchanged over a 0.018 micro wire for a transitional 4 [place] micro sheath. The micro sheath was then exchanged over a 0.035 wire for a 5 French vascular sheath. A C2 cobra catheter was advanced over a Bentson wire and used to select the common trunk of the celiac artery and superior mesenteric artery. An arteriogram was performed. There is preferential filling of the celiac axis. The 5 French catheter was then advanced into the superior mesenteric artery over a glidewire. Arteriography was performed. There is severe vaso constriction of the SMA and its branches. No convincing evidence of active arterial bleeding on the initial images. The C2 cobra catheter was then advanced into the common hepatic artery. Arteriography was performed. The dorsal pancreatic artery is patent and unremarkable. The stump of the GDA appears grossly unremarkable.  Significant vaso constriction throughout the proper hepatic and intrahepatic arteries. There appears to be an accessory left gastric artery arising distally from the segment 2 hepatic artery. A renegade STC microcatheter was advanced over a Fathom 16 microwire into the gastroduodenal stump. Contrast injection was performed. The gastric stump is secure. No evidence of contrast extravasation. The microcatheter was removed. The 5 French catheter was brought back into the origin of the superior mesenteric artery. The microcatheter was reintroduced. The microcatheter was advanced into the origin of the middle colic artery. Contrast injection was performed demonstrating no evidence of active bleeding or arterial injury. The microcatheter was next advanced into the inferior pancreaticoduodenal artery and arteriography was performed. Again, no evidence of active bleeding or arterial injury. The microcatheter was advanced into the right middle colic artery and additional arteriography was performed. Again, no evidence of arterial injury or active bleeding. At this point, all of the vessels in the region were successfully interrogated with no evidence of active arterial bleeding. The procedure was terminated. The catheters and wires were removed. Hemostasis was attained with the assistance of a Celt arterial closure device. IMPRESSION: Multi selective visceral arteriography demonstrates no evidence of active arterial bleeding or vascular injury at this time. Electronically Signed   By: Jacqulynn Cadet M.D.   On: 09/10/2020 19:21   IR Angiogram Selective Each Additional Vessel  Result Date: 09/26/2020 INDICATION: 50 year old male with severe postoperative bleeding following Whipple procedure. He was taken back to the operating room for  abdominal washout and no definitive arterial source for bleeding was identified. However, the patient continues to lose blood requiring multi unit transfusion. He presents to  interventional radiology for visceral arteriography and possible embolization if a bleeding source can be identified. EXAM: SELECTIVE VISCERAL ARTERIOGRAPHY; ADDITIONAL ARTERIOGRAPHY MEDICATIONS: None ANESTHESIA/SEDATION: Provided by general anesthesia CONTRAST:  59mL OMNIPAQUE IOHEXOL 300 MG/ML SOLN, 55mL OMNIPAQUE IOHEXOL 300 MG/ML SOLN FLUOROSCOPY TIME:  Fluoroscopy Time: 7 minutes 42 seconds (1742 mGy). COMPLICATIONS: None immediate. PROCEDURE: Informed consent was obtained from the patient following explanation of the procedure, risks, benefits and alternatives. The patient understands, agrees and consents for the procedure. All questions were addressed. A time out was performed prior to the initiation of the procedure. Maximal barrier sterile technique utilized including caps, mask, sterile gowns, sterile gloves, large sterile drape, hand hygiene, and Betadine prep. The right common femoral artery was interrogated with ultrasound and found to be widely patent. An image was obtained and stored for the medical record. Local anesthesia was attained by infiltration with 1% lidocaine. A small dermatotomy was made. Under real-time sonographic guidance, the vessel was punctured with a 21 gauge micropuncture needle. Using standard technique, the initial micro needle was exchanged over a 0.018 micro wire for a transitional 4 [place] micro sheath. The micro sheath was then exchanged over a 0.035 wire for a 5 French vascular sheath. A C2 cobra catheter was advanced over a Bentson wire and used to select the common trunk of the celiac artery and superior mesenteric artery. An arteriogram was performed. There is preferential filling of the celiac axis. The 5 French catheter was then advanced into the superior mesenteric artery over a glidewire. Arteriography was performed. There is severe vaso constriction of the SMA and its branches. No convincing evidence of active arterial bleeding on the initial images. The C2 cobra  catheter was then advanced into the common hepatic artery. Arteriography was performed. The dorsal pancreatic artery is patent and unremarkable. The stump of the GDA appears grossly unremarkable. Significant vaso constriction throughout the proper hepatic and intrahepatic arteries. There appears to be an accessory left gastric artery arising distally from the segment 2 hepatic artery. A renegade STC microcatheter was advanced over a Fathom 16 microwire into the gastroduodenal stump. Contrast injection was performed. The gastric stump is secure. No evidence of contrast extravasation. The microcatheter was removed. The 5 French catheter was brought back into the origin of the superior mesenteric artery. The microcatheter was reintroduced. The microcatheter was advanced into the origin of the middle colic artery. Contrast injection was performed demonstrating no evidence of active bleeding or arterial injury. The microcatheter was next advanced into the inferior pancreaticoduodenal artery and arteriography was performed. Again, no evidence of active bleeding or arterial injury. The microcatheter was advanced into the right middle colic artery and additional arteriography was performed. Again, no evidence of arterial injury or active bleeding. At this point, all of the vessels in the region were successfully interrogated with no evidence of active arterial bleeding. The procedure was terminated. The catheters and wires were removed. Hemostasis was attained with the assistance of a Celt arterial closure device. IMPRESSION: Multi selective visceral arteriography demonstrates no evidence of active arterial bleeding or vascular injury at this time. Electronically Signed   By: Jacqulynn Cadet M.D.   On: 09/16/2020 19:21   DG Chest Port 1 View  Result Date: 09/24/2020 CLINICAL DATA:  Endotracheal tube EXAM: PORTABLE CHEST 1 VIEW COMPARISON:  Chest x-ray 09/14/2020 FINDINGS: Interval retraction of an endotracheal  tube  with tip terminating 4.5 cm above the carina. Right internal jugular central venous catheter with tip terminating overlying the expected region of the distal superior vena cava. Enteric tube coursing below the hemidiaphragm with tip collimated off view and side port overlying the expected region of the gastric lumen. Left chest wall cardiac defibrillator in stable position. The heart size and mediastinal contours are unchanged. Persistent, slightly improved, bilateral patchy airspace opacities. No pulmonary edema. No pleural effusion. No pneumothorax. No acute osseous abnormality. IMPRESSION: 1. Persistent, slightly improved, bilateral patchy airspace opacities. 2. Lines and tubes in appropriate position. Electronically Signed   By: Iven Finn M.D.   On: 09/24/2020 04:51   DG CHEST PORT 1 VIEW  Result Date: 09/18/2020 CLINICAL DATA:  Intubated EXAM: PORTABLE CHEST 1 VIEW COMPARISON:  09/18/2020 FINDINGS: Endotracheal tube tip 2 cm above the carina. Orogastric or nasogastric tube enters the abdomen. Pacemaker defibrillator remains in place. Right internal jugular central line tip in the SVC 3 cm above the right atrium. Mild cardiomegaly as seen previously. Increased perihilar lung density, more on the right than the left. This could be due to acute edema or aspiration. IMPRESSION: Increased perihilar lung density, more on the right than the left. This could be due to acute edema or aspiration. Lines and tubes well positioned. Electronically Signed   By: Nelson Chimes M.D.   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 Complicated by postoperative hemorrhagic shock s/p takeback, abdominal washout and biliary T tube placement. - Bloody drainage from OG, T tube and ileostomy indicate the patient has had a GI bleed, possibly from  anastomosis when he was coagulopathic. Hgb has remained stable. Continue BID PPI and continue to monitor. - Abthera output is no longer bloody, now serosanguinous. Intraabdominal bleeding seems to have stopped. - 1L bolus LR this morning for intravascular volume depletion - Start TPN today, patient will have a prolonged ileus - Appreciate CCM assistance with vent management - Return to OR today for abdominal washout. Unlikely to be able to close the abdomen today due to edema. - VTE: chemical DVT ppx on hold due to bleeding - Dispo: ICU    LOS: 3 days    Michaelle Birks, MD [facility] Surgery General, Hepatobiliary and Pancreatic Surgery 09/28/2020 7:00 AM

## 2020-09-25 NOTE — Progress Notes (Signed)
Patient unstable requiring multiple blood products, last HGB 5.4 0521. This RN asked MD Zenia Resides if she wanted to initiate MTP, verbal order to initiated MTP at 0721.

## 2020-09-25 NOTE — Progress Notes (Signed)
PT Cancellation Note  Patient Details Name: [name] MRN: [id number] DOB: 03-16-1971   Cancelled Treatment:    Reason Eval/Treat Not Completed: Medical issues which prohibited therapy;Patient not medically ready. RN requesting hold on PT at this time due to pt's unstable condition and risk for further bleeding. Planning for OR today also. Will follow-up another day as appropriate.  [name], PT, DPT Acute Rehabilitation Services  Pager: [phone] Office: [place] 10/09/2020, 8:43 AM

## 2020-09-25 NOTE — Progress Notes (Signed)
NAME:  [name], MRN:  [id number], DOB:  07-04-1971, LOS: 3 ADMISSION DATE:  09/24/2020, CONSULTATION DATE:  09/09/2020 REFERRING MD:  Anesthesia, CHIEF COMPLAINT:  Whipple, post-op shock   Brief History:  50 y.o. M with PMH of neuroendocrine tumor and underwent Whipple procedure 3/14 with intra-operative SMV laceration and repair by vascular with bovine pericardial patch.  Pt had worsening shock overnight and required three pressors despite massive transfusion protocol.  He was taken back to the OR for washout on 3/15 and returned to the ICU intubated   Past Medical History:   has a past medical history of AICD (automatic cardioverter/defibrillator) present, Dyslipidemia, Heart failure with mildly reduced EF, History of kidney stones, HTN (hypertension), ventricular fibrillation, and Nonischemic cardiomyopathy.   Significant Hospital Events:  3/14 Admit to Surgery, to OR for whipple, SMV injury and repair, massive transfusion protocol overnight  3/15 Back to OR for washout, x2. IR for arteriogram. No major bleed source identified in OR, or arterial bleed w IR. Robust product resuscitation >75 products (+ TXA + novoseven) 3/16 off pressors, add'l 39 products overnight. Slowing resuscitation this morning with hemodynamic improvements and slowing drain output; OR x 2 - washout, biliary t tube, wound vac -- washout, wound vac, IR for arteriogram -- no arterial bleed for embolization 3/17 Aggressive balanced transfusion continue overnight with marked hemodynamic improvement since yesterday evening. Off pressors x several hours, Wound vac output significantly slowing (from 530m q15-335m to 50054m1hr+) and output is much thinner, Thin bloody secretions from mouth approx 250m31mecreased RR from 22 to 18, Unable to tolerate NE- severe bradycardia  Consults:  PCCM VVS  IR   Procedures:  3/15 ETT 3/15 left aline >>  Significant Diagnostic Tests:  3/15 mesenteric arteriogram> no identified  arterial source of bleed 3/16 OR wash out, wound vac, IR for ateriogram 3/17 back to OR  Micro Data:  3/15 MRSA>>negative  Antimicrobials:  Ancef 3/14 Flagyl 3/14  Interim History / Subjective:   Remains on Epi 6 mcg/min and vasopressin Sedated on fentanyl 200 mcg/min and propofol 25 mcg/kg/min- on WUA- MAE/ followed some commands  Going back to OR around lunch today for washout UOP 690 ml/ 24 hrs (~0.3 ml/kg/hr) w/ sCr 2.7-> 3.1 NGT output 490 ml Wound vac 1.7L/ 24hr Hgb stable 11.5-> 11.7  Objective   Blood pressure 126/64, pulse 83, temperature 98.6 F (37 C), resp. rate 20, height _0  (1.88 m), weight 95.3 kg, SpO2 100 %.    Vent Mode: PRVC FiO2 (%):  [30 %-40 %] 30 % Set Rate:  [18 bmp] 18 bmp Vt Set:  [650 mL] 650 mL PEEP:  [5 cmH20] 5 cmH20 Plateau Pressure:  [24 cmH20-32 cmH20] 24 cmH20   Intake/Output Summary (Last 24 hours) at 09/26/2020 [id number] data filed at 09/12/2020 0700 Gross per 24 hour  Intake 1930.09 ml  Output 3265 ml  Net -1334.91 ml   Filed Weights   09/12/2020 0639  Weight: 95.3 kg    Examination: General:  critically ill adult male sedated on MV HEENT: MM pink/dry- dried blood on tongue, ETT/ OGT with some dark bloody drainage, NGT clamped, pupils 2/reactive Neuro: sedated CV: rr, no murmur PULM:  Non labored, breathing some over set rate at 21-22, lungs clear GI: distended abd with midline wound vac with serosanguinous drainage with 2 drains , foley- amber urine  Extremities: cool/dry, generalized +2   Skin: no rashes    Resolved Hospital Problem list     Assessment &  Plan:   Acute respiratory failure with hypoxia, requiring mechanical ventilaion -intubated 3/15 -at risk for ards trali  P  - Continue MV support, ABG now given prior resp alk ABG.  Will decrease to 7 cc/kg IBW given increased PIP and abd pathology, then recheck ABG in 1hr - goal Pplat <30 and DP<15  - VAP prevention protocol/ PPI - PAD protocol for sedation>  continue fentanyl- increase ceiling to 400 mcg/hr, consider scheduled ofirmev, and change propofol to precedex given adding TPN today with elevated triglycerides, RASS goal -4/-5, may need to add prn versed - wean FiO2 as able for SpO2 >92%  - No weaning given he will likely have open abd on return from OR - ongoing aggressive pulmonary hygiene  - intermittent CXR  Hemorrhagic shock with coagulopathy and thrombocytopenia s/p MTP -Had an SMA injury with his initial op on 3/14 from tumor location but stable post op. On both take backs 3/15 no obvious major source. No arterial bleed w IR  -s/p 120 blood products P - Hgb stable today - platelets improved today 68-> 96 - continue Epi and vasopressin for MAP goal > 65 (?became bradycardic with NE, would be highly unusual given beta component but will continue current vasopressors for now) - check lactate now - trend CBC - monitor for evidence of developing infection, currently remains afebrile and normal WBC but may need empiric abx if abd remains open today to rule out distributive component   Neuroendocrine tumor s/p whipple and r hemicolectomy, smv repair (3/14); s/p washout x 2 with biliary t tube placement, wound vac application 8/46  Concern for UGIB P -CCS primary -tentatively return to OR today - blood continues from OGT, Ttube and ileostomy concerning for GIB-- dark blood.  Remains on PPI BID. Hgb stable today, plans to investigate further today during OR  AKI -in setting of shock P - remains oliguric, slight increase in sCr - continue foley  - high risk for further renal injury  - Trend BMP / urinary output - Replace electrolytes as indicated - Avoid nephrotoxic agents, ensure adequate renal perfusion   Hypocalcemia Hypomagnesemia Hypokalemia  P - additional 2 gm calcium gluconate today  - KCL runs x 4 - mag ok at 2.3 - BMET in am   HFrEF NICM HTN P -ICU monitoring -holding home meds in setting of shock -has AICD    Inadequate PO intake - CCS adding TPN 3/17   Best practice (evaluated daily)  Diet: NPO Pain/Anxiety/Delirium protocol (if indicated): fentanyl/ precedex  VAP protocol (if indicated): HOB 30 degrees, suction prn DVT prophylaxis: SCD's GI prophylaxis: protonix Glucose control: SSI Mobility: bed rest Disposition: ICU  Goals of Care:  Last date of multidisciplinary goals of care discussion: per primary Family and staff present:  Summary of discussion:  Follow up goals of care discussion due: per primary Code Status:  Full Code.  Brother at bedside, updated from [facility] standpoint.   Labs   CBC: Recent Labs  Lab 09/24/20 0004 09/24/20 0400 09/24/20 0652 09/24/20 1219 09/24/20 1650 09/23/2020 0427  WBC 4.2 2.3*  --  3.3* 3.9* 6.4  NEUTROABS  --   --   --   --   --  5.2  HGB 12.6* 11.0* 9.2* 11.7* 11.5* 11.7*  HCT 35.7* 30.4* 27.0* 33.0* 32.1* 32.6*  MCV 86.4 83.3  --  84.2 83.6 84.7  PLT 69* 50*  --  69* 69*  68* 96*    Basic Metabolic Panel: Recent Labs  Lab  09/09/2020 2153 09/24/20 0004 09/24/20 0400 09/24/20 0652 09/24/20 1219 09/24/20 1650 10/09/2020 0427  NA 144 145 146* 148* 146*  --  145  K 4.6 4.3 3.9 3.8 3.7  --  3.2*  CL 108 108 107  --  106  --  106  CO2 19* 20* 24  --  27  --  25  GLUCOSE 134* 120* 101*  --  89  --  160*  BUN 19 20 22*  --  25*  --  34*  CREATININE 2.32* 2.49* 2.61*  --  2.70*  --  3.10*  CALCIUM 8.6* 7.9* 8.0*  --  8.2*  --  7.7*  MG 2.1 1.9 1.8  --   --  2.0 2.3  PHOS  --   --   --   --   --   --  6.4*   GFR: Estimated Creatinine Clearance: 33.5 mL/min (A) (by C-G formula based on SCr of 3.1 mg/dL (H)). Recent Labs  Lab 09/24/20 0400 09/24/20 1219 09/24/20 1650 09/09/2020 0427  WBC 2.3* 3.3* 3.9* 6.4    Liver Function Tests: Recent Labs  Lab 10/09/2020 1228 09/20/2020 2153 09/24/20 0400 09/24/20 1219 09/15/2020 0427  AST 261* 409* 369* 290* 205*  ALT 101* 128* 104* 96* 134*  ALKPHOS 34* 34* 40 46 57  BILITOT 1.6* 2.4*  2.5* 2.7* 3.7*  PROT 4.3* 4.1* 4.4* 4.6* 4.7*  ALBUMIN 2.3* 2.4* 2.5* 2.5* 2.3*   No results for input(s): LIPASE, AMYLASE in the last 168 hours. No results for input(s): AMMONIA in the last 168 hours.  ABG    Component Value Date/Time   PHART 7.558 (H) 09/24/2020 0652   PCO2ART 30.0 (L) 09/24/2020 0652   PO2ART 72 (L) 09/24/2020 0652   HCO3 26.8 09/24/2020 0652   TCO2 28 09/24/2020 0652   ACIDBASEDEF 2.0 09/14/2020 1953   O2SAT 97.0 09/24/2020 0652     Coagulation Profile: Recent Labs  Lab 09/20/2020 1100 10/08/2020 1228 10/08/2020 1947 09/24/20 0400 09/24/20 1650  INR 1.6* 1.5* 0.9 1.2 1.4*    Cardiac Enzymes: No results for input(s): CKTOTAL, CKMB, CKMBINDEX, TROPONINI in the last 168 hours.  HbA1C: Hgb A1c MFr Bld  Date/Time Value Ref Range Status  09/16/2020 04:13 AM 5.2 4.8 - 5.6 % Final    Comment:    (NOTE) Pre diabetes:          5.7%-6.4%  Diabetes:              >6.4%  Glycemic control for   <7.0% adults with diabetes   11/16/2018 04:41 PM 5.6 4.8 - 5.6 % Final    Comment:             Prediabetes: 5.7 - 6.4          Diabetes: >6.4          Glycemic control for adults with diabetes: <7.0     CBG: Recent Labs  Lab 09/24/20 1957 09/24/20 2029 09/24/20 2342 09/15/2020 0339 09/19/2020 0759  GLUCAP 61* 96 120* 115* 121*    CCT 45 mins  Kennieth Rad, ACNP [facility] 10/05/2020, 8:29 AM

## 2020-09-25 NOTE — Progress Notes (Signed)
NGT noted to be clogged after large bile emesis. XR at bedside for ETT confirmation, XR shows NGT into stomach. RN attempted to slightly pull back in case NGT was malpositioned but immediate resistance met. Dr Grandville Silos to bedside, able to minimally flush but unable to reposition. NGT hooked back to suction with <172m output.

## 2020-09-25 NOTE — Progress Notes (Signed)
PHARMACY - TOTAL PARENTERAL NUTRITION CONSULT NOTE  Indication:  Expected prolonged ileus  Patient Measurements: Height: 6\' 2"  (188 cm) Weight: 95.3 kg (210 lb) IBW/kg (Calculated) : 82.2 TPN AdjBW (KG): 95.3 Body mass index is 26.96 kg/m.  Assessment:  46 YOM with history of mass adjacent to head of the pancreas in 2021.  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  Developed cholecystitis in Feb 2022 and had percutaneous cholecystectomy tube placement.  Presented this admission for neuroendocrine tumor resection with Whipple procedure, also had right total colectomy, colostomy and cholecystectomy on 10/01/2020.  SMV was lacerated during procedure and repaired by bovine pericardial patch.  Developed hemorrhagic shock with bile leak post-op and underwent placement of biliary T tube with abdominal washout on 3/15 PM.  Abdomen remains open.  Pharmacy consulted to manage TPN.  Glucose / Insulin: no hx DM, A1c 5.2% - hypoglycemic on 3/16 PM.  No SSI use. Electrolytes: Na 146, K 3.2 (goal >/= 4), Phos 6.4, CoCa WNL (iCa low at 0.97), Mag 2.3 post 2gm Renal: SCr 1.17 > 3.1, BUN up to 34 Hepatic: AST/ALT 205/134, tbili up to 3.7 (no jaundice on 3/17), TG elevated at 591.  Albumin 2.3, BL prealbumin 11.8 Intake / Output; MIVF: UOP 0.3 ml/kg/hr, NG 531mL, drain 2L, stool 70mL, emesis x1 3/16, EBL ~12L on 3/15-3/16, net +714mL but overload on exam (concentrate TPN per CCM) GI Imaging: none since TPN start GI Surgeries / Procedures:  3/17 OR for washout, VAC replacement.  Unlikely to close abdomen d/t edema  Central access: CVC double lumen placed 09/21/2020 TPN start date: 09/26/19  Nutritional Goals (RD rec on 3/16): 2500-2900 kCal and 165-190g AA per day Goal TPN is 90 ml/hr with 24g/L ILE (ILE 20% of total kCal) = 170g AA, 400g CHO and 52g ILE for a total of 2559 kCal per day  Current Nutrition:  NPO Propofol at 14.25 ml/hr = 684 kCal per day   Plan:  Initiate  concentrated TPN at 45 ml/hr (goal rate 90 ml/hr) No lipid in TPN today due to elevated TG.  To come off of Propofol. Electrolytes in TPN: no Na for now, K 5mEq/L, Ca 28mEq/L, Mag 72mEq/L, no Phos, max acetate  Continue moderate SSI Q4H KCL x 4 runs Ca gluconate 2gm IV x 1 F/U AM labs, CBGs.  Repeat TG and add in TPN if able.  Darya Bigler D. Mina Marble, PharmD, BCPS, [place] 09/10/2020, 10:21 AM

## 2020-09-25 NOTE — Anesthesia Postprocedure Evaluation (Signed)
Anesthesia Post Note  Patient: [name]  Procedure(s) Performed: RE-EXPLORATORY LAPAROTOMY; ABDOMINAL WASHOUT (N/A ) SMALL BOWEL RESECTION APPLICATION OF WOUND VAC; PLACEMENT OF NEGATIVE PRESSURE DRESSING     Patient location during evaluation: SICU Anesthesia Type: General Level of consciousness: sedated Pain management: pain level controlled Vital Signs Assessment: post-procedure vital signs reviewed and stable Respiratory status: patient remains intubated per anesthesia plan Cardiovascular status: stable Postop Assessment: no apparent nausea or vomiting Anesthetic complications: no   No complications documented.  Last Vitals:  Vitals:   09/16/2020 1617 09/30/2020 1700  BP: (!) 121/51 110/63  Pulse: 76 68  Resp: 18 18  Temp: 37.1 C 37 C  SpO2: 100% 100%    Last Pain:  Vitals:   09/12/2020 0700  TempSrc: Bladder  PainSc:                  Catalina Gravel

## 2020-09-25 NOTE — Anesthesia Preprocedure Evaluation (Signed)
Anesthesia Evaluation  Patient identified by MRN, date of birth, ID band Patient unresponsive    Reviewed: Allergy & Precautions, NPO status , Patient's Chart, lab work & pertinent test results, reviewed documented beta blocker date and time   Airway Mallampati: Intubated  TM Distance: >3 FB Neck ROM: Full    Dental  (+) Teeth Intact, Dental Advisory Given   Pulmonary asthma , former smoker,    breath sounds clear to auscultation   + intubated    Cardiovascular hypertension, Pt. on medications and Pt. on home beta blockers +CHF  Normal cardiovascular exam+ dysrhythmias Ventricular Fibrillation + Cardiac Defibrillator  Rhythm:Regular Rate:Normal     Neuro/Psych    GI/Hepatic   Endo/Other    Renal/GU      Musculoskeletal   Abdominal   Peds  Hematology  (+) Blood dyscrasia (Thrombocytopenia), anemia ,   Anesthesia Other Findings   Reproductive/Obstetrics                             Anesthesia Physical Anesthesia Plan  ASA: IV  Anesthesia Plan: General   Post-op Pain Management:    Induction: Intravenous  PONV Risk Score and Plan: 2 and Propofol infusion and Treatment may vary due to age or medical condition  Airway Management Planned: Oral ETT  Additional Equipment: Arterial line and CVP  Intra-op Plan:   Post-operative Plan: Post-operative intubation/ventilation  Informed Consent: I have reviewed the patients History and Physical, chart, labs and discussed the procedure including the risks, benefits and alternatives for the proposed anesthesia with the patient or authorized representative who has indicated his/her understanding and acceptance.     Dental advisory given  Plan Discussed with: CRNA  Anesthesia Plan Comments:         Anesthesia Quick Evaluation

## 2020-09-25 NOTE — Transfer of Care (Signed)
Immediate Anesthesia Transfer of Care Note  Patient: Bobak A Asfour  Procedure(s) Performed: RE-EXPLORATORY LAPAROTOMY; ABDOMINAL WASHOUT (N/A ) SMALL BOWEL RESECTION APPLICATION OF WOUND VAC; PLACEMENT OF NEGATIVE PRESSURE DRESSING  Patient Location: ICU  Anesthesia Type:General  Level of Consciousness: Patient remains intubated per anesthesia plan  Airway & Oxygen Therapy: Patient remains intubated per anesthesia plan and Patient placed on Ventilator (see vital sign flow sheet for setting)  Post-op Assessment: Report given to RN and Post -op Vital signs reviewed and stable  Post vital signs: Reviewed and stable  Last Vitals:  Vitals Value Taken Time  BP    Temp 37.1 C 09/09/2020 1618  Pulse 76 10/07/2020 1618  Resp 18 09/17/2020 1618  SpO2 100 % 09/16/2020 1618  Vitals shown include unvalidated device data.  Last Pain:  Vitals:   09/12/2020 0700  TempSrc: Bladder  PainSc:          Complications: No complications documented.

## 2020-09-25 NOTE — Progress Notes (Signed)
Patient under MTP all day receiving >75 blood products over my shift. Please see blood administration green sheets for all times related to administration. Temperature started at 95 in the AM recovering to stable 97-97.7 throughout the day after bair hugger and belmont initiation. All vitals filed in flowsheets q15 minutes throughout MTP.

## 2020-09-25 NOTE — Progress Notes (Signed)
Pharmacy Antibiotic Note  [name] is a 50 y.o. male admitted on 10/03/2020 for neuroendocrine tumor s/p Whipple procedure and cholecystectomy, right colectomy, colostomy.  SMA lacerated intra-op s/p bovine pericardial patch on 10/02/2020.  Hemorrhagic shock post-op s/p massive transfusion protocol.  S/p washout with biliary tube placement on 10/08/2020.  Abdomen remains open.  Pharmacy has been consulted for vancomycin dosing for intra-abdominal coverage.  Also starting Rocephin and Flagyl.  Renal function worsening, afebrile, WBC WNL  Plan: Vanc 2250mg  IV Q48H for AUC 509 using SCr 3.1 - may need to dose per level CTX 2gm IV Q24H Flagyl 500mg  IV Q8H Monitor renal fxn, clinical progress, vanc level as indicated  Height: 6\' 2"  (188 cm) Weight: 95.3 kg (210 lb) IBW/kg (Calculated) : 82.2  Temp (24hrs), Avg:98.8 F (37.1 C), Min:97.52 F (36.4 C), Max:99.68 F (37.6 C)  Recent Labs  Lab 09/13/2020 2153 09/24/20 0004 09/24/20 0400 09/24/20 1219 09/24/20 1650 09/19/2020 0427 09/21/2020 0955  WBC 4.8 4.2 2.3* 3.3* 3.9* 6.4  --   CREATININE 2.32* 2.49* 2.61* 2.70*  --  3.10*  --   LATICACIDVEN  --   --   --   --   --   --  3.7*    Estimated Creatinine Clearance: 33.5 mL/min (A) (by C-G formula based on SCr of 3.1 mg/dL (H)).    Allergies  Allergen Reactions  . Ace Inhibitors Cough    Vanc 3/17 >> CTX 3/17 >> Flagyl 3/17 >>  3/14 MRSA PCR - negative  Jayquan Bradsher D. Mina Marble, PharmD, BCPS, [place] 09/13/2020, 12:28 PM

## 2020-09-25 NOTE — Progress Notes (Signed)
Blood transfusion sheets reviewed, total of approx 140 blood products given between 3/14 2000 after initial OR procedure and 3/17 1000.

## 2020-09-26 ENCOUNTER — Encounter (HOSPITAL_COMMUNITY): Payer: Self-pay | Admitting: Surgery

## 2020-09-26 ENCOUNTER — Inpatient Hospital Stay (HOSPITAL_COMMUNITY): Payer: Managed Care, Other (non HMO)

## 2020-09-26 DIAGNOSIS — A419 Sepsis, unspecified organism: Secondary | ICD-10-CM | POA: Diagnosis not present

## 2020-09-26 DIAGNOSIS — I428 Other cardiomyopathies: Secondary | ICD-10-CM | POA: Diagnosis not present

## 2020-09-26 LAB — POCT I-STAT 7, (LYTES, BLD GAS, ICA,H+H)
Acid-Base Excess: 4 mmol/L — ABNORMAL HIGH (ref 0.0–2.0)
Acid-Base Excess: 5 mmol/L — ABNORMAL HIGH (ref 0.0–2.0)
Acid-Base Excess: 5 mmol/L — ABNORMAL HIGH (ref 0.0–2.0)
Bicarbonate: 29.6 mmol/L — ABNORMAL HIGH (ref 20.0–28.0)
Bicarbonate: 30.9 mmol/L — ABNORMAL HIGH (ref 20.0–28.0)
Bicarbonate: 29.2 mmol/L — ABNORMAL HIGH (ref 20.0–28.0)
Calcium, Ion: 1.01 mmol/L — ABNORMAL LOW (ref 1.15–1.40)
Calcium, Ion: 1.09 mmol/L — ABNORMAL LOW (ref 1.15–1.40)
Calcium, Ion: 1.05 mmol/L — ABNORMAL LOW (ref 1.15–1.40)
HCT: 29 % — ABNORMAL LOW (ref 39.0–52.0)
HCT: 30 % — ABNORMAL LOW (ref 39.0–52.0)
HCT: 28 % — ABNORMAL LOW (ref 39.0–52.0)
Hemoglobin: 10.2 g/dL — ABNORMAL LOW (ref 13.0–17.0)
Hemoglobin: 9.9 g/dL — ABNORMAL LOW (ref 13.0–17.0)
Hemoglobin: 9.5 g/dL — ABNORMAL LOW (ref 13.0–17.0)
O2 Saturation: 100 %
O2 Saturation: 100 %
O2 Saturation: 97 %
Patient temperature: 37.4
Patient temperature: 37.5
Patient temperature: 38.3
Potassium: 4.1 mmol/L (ref 3.5–5.1)
Potassium: 4.1 mmol/L (ref 3.5–5.1)
Potassium: 4.2 mmol/L (ref 3.5–5.1)
Sodium: 145 mmol/L (ref 135–145)
Sodium: 146 mmol/L — ABNORMAL HIGH (ref 135–145)
Sodium: 145 mmol/L (ref 135–145)
TCO2: 31 mmol/L (ref 22–32)
TCO2: 32 mmol/L (ref 22–32)
TCO2: 30 mmol/L (ref 22–32)
pCO2 arterial: 46.5 mmHg (ref 32.0–48.0)
pCO2 arterial: 50.9 mmHg — ABNORMAL HIGH (ref 32.0–48.0)
pCO2 arterial: 45.3 mmHg (ref 32.0–48.0)
pH, Arterial: 7.393 (ref 7.350–7.450)
pH, Arterial: 7.413 (ref 7.350–7.450)
pH, Arterial: 7.422 (ref 7.350–7.450)
pO2, Arterial: 252 mmHg — ABNORMAL HIGH (ref 83.0–108.0)
pO2, Arterial: 309 mmHg — ABNORMAL HIGH (ref 83.0–108.0)
pO2, Arterial: 93 mmHg (ref 83.0–108.0)

## 2020-09-26 LAB — LACTIC ACID, PLASMA
Lactic Acid, Venous: 2.3 mmol/L (ref 0.5–1.9)
Lactic Acid, Venous: 1.7 mmol/L (ref 0.5–1.9)
Lactic Acid, Venous: 1.6 mmol/L (ref 0.5–1.9)
Lactic Acid, Venous: 1.7 mmol/L (ref 0.5–1.9)

## 2020-09-26 LAB — COMPREHENSIVE METABOLIC PANEL
ALT: 84 U/L — ABNORMAL HIGH (ref 0–44)
AST: 75 U/L — ABNORMAL HIGH (ref 15–41)
Albumin: 1.8 g/dL — ABNORMAL LOW (ref 3.5–5.0)
Alkaline Phosphatase: 58 U/L (ref 38–126)
Anion gap: 9 (ref 5–15)
BUN: 34 mg/dL — ABNORMAL HIGH (ref 6–20)
CO2: 26 mmol/L (ref 22–32)
Calcium: 7.8 mg/dL — ABNORMAL LOW (ref 8.9–10.3)
Chloride: 108 mmol/L (ref 98–111)
Creatinine, Ser: 2.39 mg/dL — ABNORMAL HIGH (ref 0.61–1.24)
GFR, Estimated: 32 mL/min — ABNORMAL LOW (ref >60)
Glucose, Bld: 157 mg/dL — ABNORMAL HIGH (ref 70–99)
Potassium: 4 mmol/L (ref 3.5–5.1)
Sodium: 143 mmol/L (ref 135–145)
Total Bilirubin: 4.3 mg/dL — ABNORMAL HIGH (ref 0.3–1.2)
Total Protein: 4.2 g/dL — ABNORMAL LOW (ref 6.5–8.1)

## 2020-09-26 LAB — ECHOCARDIOGRAM COMPLETE
Area-P 1/2: 3.37 cm2
Calc EF: 41.5 %
Height: 74 in
S' Lateral: 3.6 cm
Single Plane A2C EF: 47.2 %
Single Plane A4C EF: 34.3 %
Weight: 3360 oz

## 2020-09-26 LAB — GLUCOSE, CAPILLARY
Glucose-Capillary: 118 mg/dL — ABNORMAL HIGH (ref 70–99)
Glucose-Capillary: 120 mg/dL — ABNORMAL HIGH (ref 70–99)
Glucose-Capillary: 126 mg/dL — ABNORMAL HIGH (ref 70–99)
Glucose-Capillary: 126 mg/dL — ABNORMAL HIGH (ref 70–99)
Glucose-Capillary: 129 mg/dL — ABNORMAL HIGH (ref 70–99)
Glucose-Capillary: 146 mg/dL — ABNORMAL HIGH (ref 70–99)

## 2020-09-26 LAB — TRIGLYCERIDES: Triglycerides: 304 mg/dL — ABNORMAL HIGH (ref <150)

## 2020-09-26 LAB — MAGNESIUM
Magnesium: 2.1 mg/dL (ref 1.7–2.4)
Magnesium: 1.9 mg/dL (ref 1.7–2.4)

## 2020-09-26 LAB — CBC
HCT: 31 % — ABNORMAL LOW (ref 39.0–52.0)
Hemoglobin: 10.8 g/dL — ABNORMAL LOW (ref 13.0–17.0)
MCH: 30.3 pg (ref 26.0–34.0)
MCHC: 34.8 g/dL (ref 30.0–36.0)
MCV: 86.8 fL (ref 80.0–100.0)
Platelets: 85 10*3/uL — ABNORMAL LOW (ref 150–400)
RBC: 3.57 MIL/uL — ABNORMAL LOW (ref 4.22–5.81)
RDW: 16.1 % — ABNORMAL HIGH (ref 11.5–15.5)
WBC: 8.7 10*3/uL (ref 4.0–10.5)
nRBC: 1.6 % — ABNORMAL HIGH (ref 0.0–0.2)

## 2020-09-26 LAB — PHOSPHORUS: Phosphorus: 5 mg/dL — ABNORMAL HIGH (ref 2.5–4.6)

## 2020-09-26 LAB — CORTISOL: Cortisol, Plasma: 14.3 ug/dL

## 2020-09-26 MED ORDER — MIDAZOLAM HCL 2 MG/2ML IJ SOLN
2.0000 mg | INTRAMUSCULAR | Status: DC | PRN
  Administered 2020-09-26 – 2020-10-05 (×10): 2 mg via INTRAVENOUS
  Filled 2020-09-26 (×7): qty 2

## 2020-09-26 MED ORDER — LACTATED RINGERS IV BOLUS
1000.0000 mL | Freq: Once | INTRAVENOUS | Status: AC
  Administered 2020-09-26: 1000 mL via INTRAVENOUS

## 2020-09-26 MED ORDER — MIDAZOLAM HCL 2 MG/2ML IJ SOLN
2.0000 mg | INTRAMUSCULAR | Status: DC | PRN
  Administered 2020-09-28: 2 mg via INTRAVENOUS
  Filled 2020-09-26 (×4): qty 2

## 2020-09-26 MED ORDER — TRACE MINERALS CU-MN-SE-ZN 300-55-60-3000 MCG/ML IV SOLN
INTRAVENOUS | Status: AC
  Filled 2020-09-26: qty 1137.6

## 2020-09-26 MED ORDER — SODIUM CHLORIDE 0.9 % IV SOLN
0.5000 mg/h | INTRAVENOUS | Status: DC
  Administered 2020-09-26: 0.5 mg/h via INTRAVENOUS
  Administered 2020-09-30: 1 mg/h via INTRAVENOUS
  Filled 2020-09-26 (×3): qty 20

## 2020-09-26 MED ORDER — VANCOMYCIN VARIABLE DOSE PER UNSTABLE RENAL FUNCTION (PHARMACIST DOSING): Status: DC

## 2020-09-26 MED ORDER — VANCOMYCIN HCL 2000 MG/400ML IV SOLN
2000.0000 mg | Freq: Once | INTRAVENOUS | Status: AC
  Administered 2020-09-26: 2000 mg via INTRAVENOUS
  Filled 2020-09-26: qty 400

## 2020-09-26 MED ORDER — ALBUMIN HUMAN 5 % IV SOLN
12.5000 g | Freq: Once | INTRAVENOUS | Status: AC
  Administered 2020-09-26: 12.5 g via INTRAVENOUS
  Filled 2020-09-26: qty 250

## 2020-09-26 NOTE — Op Note (Signed)
Date: 09/26/20  Patient: [name] MRN: [id number]  Preoperative Diagnosis: Open abdomen following Whipple with hemorrhagic shock Postoperative Diagnosis: Small bowel necrosis  Procedure: Re-opening of recent laparotomy, small bowel resection, placement of negative pressure dressing  Surgeon: Michaelle Birks, MD Assistant: Georganna Skeans, MD  EBL: 100 mL (extensive clot evacuated but very minimal new blood loss)  Anesthesia: General  Specimens: Small bowel  Indications: [name] is a 50 yo male who underwent a Whipple 3 days ago for a large neuroendocrine tumor at the head of the pancreas and root of the small bowel mesentery, whose postop course was complicated by hemorrhagic shock and DIC requiring re-exploration and massive resuscitation. His abdomen was left open and bleeding and acidosis have since been corrected. He has overall clinically improved and was brought back today for re-exploration and Abthera change.  Findings: Diffuse necrosis of the small bowel except for the pancreaticobiliary limb and about 50cm beyond the GJ anastomosis. Necrotic bowel, including the ileostomy, were resected. PB limb was dusky but still viable and the T tube had been dislodged. Extensive clot was noted within the small bowel mesentery.  Procedure details: Informed consent was obtained from the patient's family. The patient was brought to the operating room and placed on the table in the supine position. He was already intubated with appropriate lines in place. Perioperative antibiotics were administered per SCIP guidelines. The abdomen was prepped and draped in the usual sterile fashion. A pre-procedure timeout was taken verifying patient identity, surgical site and procedure to be performed.  The [name] was removed. The majority of the small bowel was necrotic. The previously placed laparotomy pads were removed, 11 in total. The small bowel was run from the biliary anastomosis to the ileostomy.  Only the PB limb and proximal 50cm of jejunum beyond the duodenojejunal anastomosis were still viable. The remainder of the small bowel, including the ileostomy, were necrotic. The colon and stomach were viable. The PB limb was dusky but viable. The T tube was no longer in the bile duct and was removed. The biliary anastomosis however remained in tact. We elected to resect the necrotic bowel. The proximal jejunum was transected with a 62mm GIA blue load stapler. The mesentery was divided close to the bowel with Ligasure. Extensive clot burden was present throughout the small bowel mesentery. The ileostomy was taken down from the skin and fascia and the remaining mesentery was divided. The small bowel was passed off the field and sent for routine pathology. There was extensive clot throughout the abdomen that was completely evacuated. The abdomen was hemostatic. It was irrigated with several liters of warmed saline. The stomach was palpated and both the NG tube and OG tube were within the lumen. Retraction of the NG tube was attempted by anesthesia but was not possible. They performed laryngoscopy which showed the NG had been kinked and was stuck to the ETT. They were able to remove the NG under direct visualization. The OG tube was left in place in the stomach and was functional. A new Abthera was placed and applied to suction.  All counts were correct x2 at the end of the procedure. The patient was transported back to the ICU at the completion of the procedure.  Michaelle Birks, MD 09/26/20 6:24 AM

## 2020-09-26 NOTE — Progress Notes (Signed)
Physical Therapy Discharge Patient Details Name: [name] MRN: [id number] DOB: [birth year] Today's Date: 09/26/2020 Time:  -     Patient discharged from PT services secondary to Pt has been unable to participate due to medical complications with continual bleeding and multiple blood transfusions. RN is aware and has issued hold on PT at this time. PT evaluation not initiated secondary to above.    Patient unable to participate in discharge planning and no caregivers available   Please re-consult PT again if pt becomes more medically appropriate.     [name], PT, DPT Acute Rehabilitation Services  Pager: [phone] Office: [phone]   GP     Orvan Falconer 09/26/2020, 10:49 AM

## 2020-09-26 NOTE — Progress Notes (Signed)
[facility] Progress Note Patient Name: [name] DOB: 03-12-1971 MRN: [id number]   Date of Service  09/26/2020  HPI/Events of Note  Patient is intubated and mechanically ventilated, and needs an order for wrist restraints to prevent self-extubation.  eICU Interventions  Bilateral soft wrist restraints ordered.        Kerry Kass Meg Niemeier 09/26/2020, 12:33 AM

## 2020-09-26 NOTE — Progress Notes (Signed)
1 Day Post-Op  Subjective: At takeback to OR yesterday patient was found to have necrosis of the majority of the small bowel. Overnight he has remained on low dose epi and vaso. UOP much improved, creatinine downtrending, no acidosis.   Objective: Vital signs in last 24 hours: Temp:  [98.42 F (36.9 C)-100.94 F (38.3 C)] 100.58 F (38.1 C) (03/18 0645) Pulse Rate:  [55-108] 70 (03/18 0645) Resp:  [17-27] 18 (03/18 0645) BP: (91-144)/(50-97) 109/64 (03/18 0600) SpO2:  [98 %-100 %] 100 % (03/18 0645) Arterial Line BP: (93-171)/(42-85) 117/56 (03/18 0645) FiO2 (%):  [30 %] 30 % (03/18 0330) Last BM Date:  (pta)  Intake/Output from previous day: 03/17 0701 - 03/18 0700 In: 5961.6 [I.V.:2478.9; IV Piggyback:3482.7] Out: 5405 [Urine:1790; Emesis/NG output:1100; Drains:1760; Stool:55; Blood:700] Intake/Output this shift: No intake/output data recorded.  PE: General: intubated, sedated HEENT: OG in place draining blood-tinged fluid. ETT in place. Cordis in place R neck. CV: RRR Resp: intubated, on vent Abdomen: Soft, nondistended. Abthera in place with thin serosanguinous drainage.  LUQ pancreatic stent with clear colorless fluid. GU: foley in place, draining clear urine  Lab Results:  Recent Labs    09/24/2020 0427 09/28/2020 0910 09/26/20 0250 09/26/20 0522  WBC 6.4  --   --  8.7  HGB 11.7*   < > 9.5* 10.8*  HCT 32.6*   < > 28.0* 31.0*  PLT 96*  --   --  85*   < > = values in this interval not displayed.   BMET Recent Labs    09/24/2020 0427 10/08/2020 0910 09/26/20 0250 09/26/20 0522  NA 145   < > 145 143  K 3.2*   < > 4.2 4.0  CL 106  --   --  108  CO2 25  --   --  26  GLUCOSE 160*  --   --  157*  BUN 34*  --   --  34*  CREATININE 3.10*  --   --  2.39*  CALCIUM 7.7*  --   --  7.8*   < > = values in this interval not displayed.   PT/INR Recent Labs    09/24/20 0400 09/24/20 1650  LABPROT 14.6 16.8*  INR 1.2 1.4*   CMP     Component Value Date/Time    NA 143 09/26/2020 0522   NA 142 07/21/2020 0921   K 4.0 09/26/2020 0522   CL 108 09/26/2020 0522   CO2 26 09/26/2020 0522   GLUCOSE 157 (H) 09/26/2020 0522   BUN 34 (H) 09/26/2020 0522   BUN 16 07/21/2020 0921   CREATININE 2.39 (H) 09/26/2020 0522   CREATININE 1.16 03/01/2016 1607   CALCIUM 7.8 (L) 09/26/2020 0522   PROT 4.2 (L) 09/26/2020 0522   PROT 6.7 07/21/2020 0921   ALBUMIN 1.8 (L) 09/26/2020 0522   ALBUMIN 4.1 07/21/2020 0921   AST 75 (H) 09/26/2020 0522   ALT 84 (H) 09/26/2020 0522   ALKPHOS 58 09/26/2020 0522   BILITOT 4.3 (H) 09/26/2020 0522   BILITOT 0.8 07/21/2020 0921   GFRNONAA 32 (L) 09/26/2020 0522   GFRNONAA >89 09/22/2015 1008   GFRAA 82 07/21/2020 0921   GFRAA >89 09/22/2015 1008   Lipase     Component Value Date/Time   LIPASE 23 08/22/2020 0318       Studies/Results: DG CHEST PORT 1 VIEW  Result Date: 09/15/2020 CLINICAL DATA:  Hepatobiliary and pancreatic surgery, intubated EXAM: PORTABLE CHEST 1 VIEW COMPARISON:  09/24/2020 FINDINGS:  Endotracheal tube, right central line, and left AICD remain in place, unchanged. Vascular congestion. Perihilar opacities may reflect edema. No visible effusions or pneumothorax. IMPRESSION: Mild vascular congestion and perihilar opacities, possibly edema. Electronically Signed   By: Rolm Baptise M.D.   On: 10/05/2020 08:11      Assessment/Plan 50 yo male with a neuroendocrine tumor of the head of the pancreas, POD4 s/p right hemicolectomy, end ileostomy, Whipple and patch angioplasty repair of SMV. Complicated by postoperative hemorrhagic shock with DIC. At his takeback to the OR yesterday he had necrosis of the entire small bowel except for the PB limb and proximal 40-50cm of jejunum beyond the DJ anastomosis. The PB limb looked threatened but was still viable. Necrotic bowel was resected. This is obviously a devastating complication and if the patient survives he will have short gut. I think bowel ischemia is most  likely secondary to prolonged hypotension with significant pressor requirement while he was in shock, possibly exacerbated by thrombosis of the SMV repair. I had a long discussion with the patient's family yesterday afternoon at bedside. Effective communication of this complex situation has been limited by the language barrier, but I was able to have a good discussion with them with the use of a phone interpreter and assistance of one of the patient's good friends who has been present throughout, has a good understanding of the situation and is able to interpret for the family. I explained that overall the prognosis at this point is grave, but I think survival is still a possibility. He is overall physiologically much improved, is not acidotic, and renal function is improving. He will have short gut if he survives, and I explained that he will be TPN dependent. At this point the most critical factor will be whether or not the pancreaticobiliary limb of bowel becomes necrotic - if this happens this will not be a survivable situation without any capacity for bile absorption and enterohepatic circulation. The PB limb looked dusky yesterday but was still viable. I will take him back likely tomorrow for another look and we will have further discussions regarding the next steps after that. In the meantime, it is essential to maintain adequate hydration and minimize vasoconstriction to maximize perfusion to the little small bowel that remains.  - Continue TPN and IV fluid hydration - Wean pressors to minimize peripheral vasoconstriction - Appreciate CCM assistance with vent management - Plan to return to OR tomorrow morning - Antibiotics started for ischemic bowel - VTE: chemical DVT ppx on hold due to recent bleeding and thrombocytopenia - Dispo: ICU   LOS: 4 days    Michaelle Birks, MD [facility] Surgery General, Hepatobiliary and Pancreatic Surgery 09/26/20 7:07 AM

## 2020-09-26 NOTE — Progress Notes (Signed)
  Echocardiogram 2D Echocardiogram has been performed.  [name] 09/26/2020, 10:02 AM

## 2020-09-26 NOTE — Progress Notes (Signed)
Nutrition Follow-up  DOCUMENTATION CODES:   Not applicable  INTERVENTION:   TNA to meet nutrition needs  Pt at high risk of malnutrition due to catabolic state.   NUTRITION DIAGNOSIS:   Increased nutrient needs related to post-op healing as evidenced by estimated needs. Ongoing.   GOAL:   Patient will meet greater than or equal to 90% of their needs Progressing.   MONITOR:   Vent status,Labs  REASON FOR ASSESSMENT:   Ventilator    ASSESSMENT:   Pt with a PMH of HTN, HLD, HF with reduced EF, AICD and dx mass adjacent to head of the pancreas in 2021.  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  Developed cholecystitis in Feb 2022 and had percutaneous cholecystectomy tube placement.  Presented this admission for neuroendocrine tumor resection with Whipple procedure, also had right total colectomy, colostomy and cholecystectomy on 10/04/2020.  SMV was lacerated during procedure and repaired by bovine pericardial patch.  Developed hemorrhagic shock with bile leak post-op and underwent placement of biliary T tube with abdominal washout on 3/15.    Pt discussed during ICU rounds and with RN.  Pt started on TPN. Bowel surgery yesterday as documented below.   3/14 s/p whipple; intra-operative SMV laceration s/p repair; massive transfusion protocol - 120 products; maxed on pressors  3/15 s/p washout and VAC placement  3/16 off pressors; VAC output decreasing, hematemesis with NG not functioning, OG placed 3/17 s/p re-opening of recent laparotomy, SBR, VAC placement (per OR report pt with SB necrosis, only viable areas are stomach pb limb, 40-50 cm beyond GJ anastomosis, and colon) Pt will be TPN dependent. Concern that PB limb may become necrotic.  3/18 TPN initiated @ 45 ml/hr with plans to advance to 90 ml on 3/19  Patient is currently intubated on ventilator support MV: 21.2 L/min Temp (24hrs), Avg:99.8 F (37.7 C), Min:98.42 F (36.9 C), Max:100.94  F (38.3 C)  Propofol: off Medications reviewed and include:  Precedex  Epinephrine @ 6 mcg  Vasopressin: .02 units  Labs reviewed: PO4: 5   Output:  RUQ drain: removed Abd drain: 330 ml VAC abd: 900 ml OG tube: 1100 ml Colostomy: 50 ml (now taken down)    Diet Order:   Diet Order            Diet NPO time specified  Diet effective now                 EDUCATION NEEDS:   No education needs have been identified at this time  Skin:  Skin Assessment:  (open abd with VAC)  Last BM:  200 ml via colostomy  Height:   Ht Readings from Last 1 Encounters:  09/12/2020 6\' 2"  (1.88 m)    Weight:   Wt Readings from Last 1 Encounters:  10/02/2020 95.3 kg    Ideal Body Weight:     BMI:  Body mass index is 26.96 kg/m.  Estimated Nutritional Needs:   Kcal:  2500-3704  Protein:  165-190 grams  Fluid:  > 2L/day  Lockie Pares., RD, LDN, CNSC See AMiON for contact information

## 2020-09-26 NOTE — Progress Notes (Signed)
PHARMACY - TOTAL PARENTERAL NUTRITION CONSULT NOTE  Indication:  Expected prolonged ileus  Patient Measurements: Height: 6\' 2"  (188 cm) Weight: 95.3 kg (210 lb) IBW/kg (Calculated) : 82.2 TPN AdjBW (KG): 95.3 Body mass index is 26.96 kg/m.  Assessment:  67 YOM with history of mass adjacent to head of the pancreas in 2021.  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  Developed cholecystitis in Feb 2022 and had percutaneous cholecystectomy tube placement.  Presented this admission for neuroendocrine tumor resection with Whipple procedure, also had right total colectomy, colostomy and cholecystectomy on 10/04/2020.  SMV was lacerated during procedure and repaired by bovine pericardial patch.  Developed hemorrhagic shock with bile leak post-op and underwent placement of biliary T tube with abdominal washout on 3/15 PM.  Abdomen remains open.  Pharmacy consulted to manage TPN.  Glucose / Insulin: no hx DM, A1c 5.2% - hypoglycemic on 3/16 PM.  No SSI use. Electrolytes: Na 143, K 4 (goal >/= 4), Phos 5, CoCa WNL (iCa low at 0.97), Mag 2.1  Renal: SCr 1.17 > 3.1>2.39, BUN up to 34 Hepatic: AST/ALT 205/134>75/84, tbili up to 3.7>4.3, TG elevated at 304 (now off propofol).  Albumin 2.3>1.8, BL prealbumin 11.8 Intake / Output; MIVF: UOP 0.8 ml/kg/hr, NG 1192mL, drain 1760L, stool 69mL, EBL ~12L on 3/15-3/16 + 700 ml, (concentrate TPN per CCM) GI Imaging: none since TPN start GI Surgeries / Procedures:  3/17 OR for washout, VAC replacement.  Unlikely to close abdomen d/t edema - necrosis of the entire small bowel except for the PB limb and proximal 40-50cm of jejunum  Central access: CVC double lumen placed 09/26/2020 TPN start date: 09/26/19  Nutritional Goals (RD rec on 3/16): 2500-2900 kCal and 165-190g AA per day Goal TPN is 90 ml/hr with 24g/L ILE (ILE 20% of total kCal) = 170g AA, 400g CHO and 52g ILE for a total of 2559 kCal per day  Current Nutrition:  NPO   Plan:   Initiate concentrated TPN at 45 ml/hr (goal rate 90 ml/hr) Continue with no lipid in TPN today due to elevated TG.   Propofol now off. Electrolytes in TPN: no Na for now, K 29mEq/L, Ca 34mEq/L, Mag 48mEq/L, no Phos, max acetate  Continue moderate SSI Q4H  F/U AM labs, CBGs.  Repeat TG.  Alanda Slim, PharmD, [facility] Clinical Pharmacist Please see AMION for all Pharmacists' Contact Phone Numbers 09/26/2020, 7:55 AM

## 2020-09-26 NOTE — Progress Notes (Signed)
PT Cancellation Note  Patient Details Name: [name] MRN: [id number] DOB: [birth year]   Cancelled Treatment:    Reason Eval/Treat Not Completed: Medical issues which prohibited therapy;Patient not medically ready. Pt continues to not be appropriate for PT at this time with RN requesting hold on PT today. Will follow-up another day if appropriate.  [name], PT, DPT Acute Rehabilitation Services  Pager: [phone] Office: [place] 09/26/2020, 8:18 AM

## 2020-09-26 NOTE — Progress Notes (Signed)
NAMEJIM Solomon, MRN:  [id number], DOB:  18-Dec-1970, LOS: 4 ADMISSION DATE:  10/01/2020, CONSULTATION DATE:  09/26/20 REFERRING MD:  Anesthesia, CHIEF COMPLAINT:  Whipple, post-op shock   Brief History:  50 y.o. M with PMH of neuroendocrine tumor and underwent Whipple procedure 3/14 with intra-operative SMV laceration and repair by vascular with bovine pericardial patch.  Pt had worsening shock overnight and required three pressors despite massive transfusion protocol (received 150 blood products).  He was taken back to the OR for washout on 3/15 and returned to the ICU intubated.  Taken back 3/17 again found to have small bowel necrosis s/p resection.  Plans again to return 3/19 to OR.   Past Medical History:   has a past medical history of AICD (automatic cardioverter/defibrillator) present, Dyslipidemia, Heart failure with mildly reduced EF, History of kidney stones, HTN (hypertension), ventricular fibrillation, and Nonischemic cardiomyopathy.   Significant Hospital Events:  3/14 Admit to Surgery, to OR for whipple, SMV injury and repair, massive transfusion protocol overnight  3/15 Back to OR for washout, x2. IR for arteriogram. No major bleed source identified in OR, or arterial bleed w IR. Robust product resuscitation >75 products (+ TXA + novoseven) 3/16 off pressors, add'l 39 products overnight. Slowing resuscitation this morning with hemodynamic improvements and slowing drain output; OR x 2 - washout, biliary t tube, wound vac -- washout, wound vac, IR for arteriogram -- no arterial bleed for embolization 3/17 Aggressive balanced transfusion continue overnight with marked hemodynamic improvement since yesterday evening. Off pressors x several hours, Wound vac output significantly slowing (from 578ml q15-86min to 571ml q1hr+) and output is much thinner, Thin bloody secretions from mouth approx 222ml, Decreased RR from 22 to 18, Unable to tolerate NE- severe bradycardia  Consults:   PCCM VVS  IR   Procedures:  3/15 ETT 3/15 R IJ cordis/ CVL>> 3/15 left aline >>  Significant Diagnostic Tests:  3/15 mesenteric arteriogram> no identified arterial source of bleed 3/16 OR wash out, wound vac, IR for ateriogram 3/17 Remains on Epi 6 mcg/min and vasopressin, following some commands, back to OR found to have small bowel necrosis s/p resection, sCr 2.7-> 3.1, Wound vac 1.7L/ 24hr, Hgb stable 11.5-> 11.7  Micro Data:  3/15 MRSA>>negative  Antimicrobials:  Ancef 3/14 Flagyl 3/14 3/17 flagyl >> 3/17 ceftriaxone >> 3/18 vanc >>  Interim History / Subjective:  S/p OR yesterday found to have small bowel necrosis s/p resection, left with open abd and wound vac placement.   Improved UOP 1.7L / 24hr, sCr 3.1-> 2.39 Lactate improving Afebrile  950 ml dark bloody output from OGT, Hgb remains stable 10.8 Remains on 5 mcg/min of epi, attempting to titrate vasopressin off, currently on 0.01 Remains sedated on fentanyl 300 mcg/min, precedex Intermittently waking up this morning and following some commands, nods no to abd pain Plans to return to OR 3/19  Overall, improved shock state from yesterday  Objective   Blood pressure 109/64, pulse 70, temperature (!) 100.58 F (38.1 C), resp. rate 18, height 6\' 2"  (1.88 m), weight 95.3 kg, SpO2 100 %.    Vent Mode: PRVC FiO2 (%):  [30 %] 30 % Set Rate:  [18 bmp] 18 bmp Vt Set:  [580 mL] 580 mL PEEP:  [5 cmH20] 5 cmH20 Plateau Pressure:  [19 cmH20-24 cmH20] 19 cmH20   Intake/Output Summary (Last 24 hours) at 09/26/2020 0828 Last data filed at 09/26/2020 0522 Gross per 24 hour  Intake 4900.54 ml  Output 5405 ml  Net -504.46 ml   Filed Weights   09/11/2020 0639  Weight: 95.3 kg    Examination: General:  Critically ill adult male in NAD HEENT: MM pink/moist, ETT/ OGT - dark bloody drainage, pupils 3/reactive, scleral icterus present Neuro: sedated but will attempt to open eyes, f/c commands- wiggles toes and gives  thumbs up, nods approprietly to questions CV: rr, no murmur PULM:  MV supported breaths, thick tan secretions this morning, now cleared, CTA, no wheeze GI: less distended, wound vac remains, dressing over prior ileostomy site, left drain remains, foley- amber urine Extremities: warm/dry, overall appears to have mildly improved anasarca  Skin: no rashes   Resolved Hospital Problem list     Assessment & Plan:   Acute respiratory failure with hypoxia, requiring mechanical ventilaion -intubated 3/15 -at risk for ards trali  P -Continue MV support, 7cc/kg IBW with goal Pplat <30 and DP<15  -VAP prevention protocol/ PPI -PAD protocol for sedation> change fentanyl to dilaudid for better pain control, continue precedex, add prn versed  -wean FiO2 as able for SpO2 >92%  - no weaning with open abd - intermittent CXR  Hemorrhagic shock with coagulopathy and thrombocytopenia s/p MTP -Had an SMA injury with his initial op on 3/14 from tumor location but stable post op. On both take backs 3/15 no obvious major source. No arterial bleed w IR  -s/p 150 blood products  Suspected UGIB P - Hgb trend remains stable despite OGT output - monitor closely  - platelets improved today 68-> 96-> 85 - continue Epi and vasopressin for MAP goal 65 (became bradycardic with NE), goal is to wean vasopressin off first, then epi - additional Albumin x 1 now - continue MIVF for cumulative total of ~138ml/hr  - lactate improved 3.7-> 2.3 - LFTs and renal function improving - TTE pending today  - check/ trend CVP, goal CVP ideally > 6-8 while on vasopressors  - possible sepsis component from intra-abdominal, abx added 3/17 - continue PPI BID   Neuroendocrine tumor s/p whipple and r hemicolectomy, smv repair (3/14); s/p washout x 2 with biliary t tube placement, wound vac application 7/78  Concern for UGIB P -CCS primary - returned to OR 3/17 found to have small bowel necrosis s/p resection.  Plan per CCS  continue aggressive resuscitative efforts to stabilize shock for bowel perfusion, and going back to OR 3/19 again to examine bowel then determine plan moving forward.   - ongoing wound vac w/ open abd, RASS goal -4/ pain control most important, changing to dilaudid gtt today - continue abx- ceftriaxone, flagyl, vanc  AKI -in setting of shock P - better UOP today and renal function - continue MIVF/ hydration - albumin x 1 - continue foley  - Trend BMP / urinary output - Replace electrolytes as indicated - Avoid nephrotoxic agents, ensure adequate renal perfusion   Hypocalcemia Hypomagnesemia Hypokalemia  P - trend BMET/ Mag  HFrEF NICM HTN P -ICU monitoring -holding home meds in setting of shock -has AICD  - pending TTE  Inadequate PO intake - TPN started 3/17  Best practice (evaluated daily)  Diet: NPO Pain/Anxiety/Delirium protocol (if indicated): fentanyl/ precedex  VAP protocol (if indicated): HOB 30 degrees, suction prn DVT prophylaxis: SCD's GI prophylaxis: protonix Glucose control: SSI Mobility: bed rest Disposition: ICU  Goals of Care:  Last date of multidisciplinary goals of care discussion: per primary Family and staff present:  Summary of discussion:  Follow up goals of care discussion due: per primary Code Status:  Full Code.   No family at bedside on rounds.  Updated per primary team  Labs   CBC: Recent Labs  Lab 09/24/20 0400 09/24/20 0652 09/24/20 1219 09/24/20 1650 09/20/2020 0427 09/18/2020 0910 10/03/2020 1050 09/20/2020 1443 09/14/2020 1520 09/26/20 0250 09/26/20 0522  WBC 2.3*  --  3.3* 3.9* 6.4  --   --   --   --   --  8.7  NEUTROABS  --   --   --   --  5.2  --   --   --   --   --   --   HGB 11.0*   < > 11.7* 11.5* 11.7*   < > 10.5* 10.2* 9.9* 9.5* 10.8*  HCT 30.4*   < > 33.0* 32.1* 32.6*   < > 31.0* 30.0* 29.0* 28.0* 31.0*  MCV 83.3  --  84.2 83.6 84.7  --   --   --   --   --  86.8  PLT 50*  --  69* 69*  68* 96*  --   --   --   --    --  85*   < > = values in this interval not displayed.    Basic Metabolic Panel: Recent Labs  Lab 09/24/20 0004 09/24/20 0400 09/24/20 0652 09/24/20 1219 09/24/20 1650 09/29/2020 0427 09/12/2020 0910 10/01/2020 1050 09/15/2020 1443 09/09/2020 1520 10/04/2020 2249 09/26/20 0250 09/26/20 0522  NA 145 146*   < > 146*  --  145   < > 146* 145 146*  --  145 143  K 4.3 3.9   < > 3.7  --  3.2*   < > 3.5 4.1 4.1  --  4.2 4.0  CL 108 107  --  106  --  106  --   --   --   --   --   --  108  CO2 20* 24  --  27  --  25  --   --   --   --   --   --  26  GLUCOSE 120* 101*  --  89  --  160*  --   --   --   --   --   --  157*  BUN 20 22*  --  25*  --  34*  --   --   --   --   --   --  34*  CREATININE 2.49* 2.61*  --  2.70*  --  3.10*  --   --   --   --   --   --  2.39*  CALCIUM 7.9* 8.0*  --  8.2*  --  7.7*  --   --   --   --   --   --  7.8*  MG 1.9 1.8  --   --  2.0 2.3  --   --   --   --  1.9  --  2.1  PHOS  --   --   --   --   --  6.4*  --   --   --   --   --   --  5.0*   < > = values in this interval not displayed.   GFR: Estimated Creatinine Clearance: 43.5 mL/min (A) (by C-G formula based on SCr of 2.39 mg/dL (H)). Recent Labs  Lab 09/24/20 1219 09/24/20 1650 09/11/2020 0427 09/12/2020 0955 09/26/20 0037 09/26/20 0522  WBC 3.3* 3.9* 6.4  --   --  8.7  LATICACIDVEN  --   --   --  3.7* 2.3*  --     Liver Function Tests: Recent Labs  Lab 10/08/2020 2153 09/24/20 0400 09/24/20 1219 09/21/2020 0427 09/26/20 0522  AST 409* 369* 290* 205* 75*  ALT 128* 104* 96* 134* 84*  ALKPHOS 34* 40 46 57 58  BILITOT 2.4* 2.5* 2.7* 3.7* 4.3*  PROT 4.1* 4.4* 4.6* 4.7* 4.2*  ALBUMIN 2.4* 2.5* 2.5* 2.3* 1.8*   No results for input(s): LIPASE, AMYLASE in the last 168 hours. No results for input(s): AMMONIA in the last 168 hours.  ABG    Component Value Date/Time   PHART 7.422 09/26/2020 0250   PCO2ART 45.3 09/26/2020 0250   PO2ART 93 09/26/2020 0250   HCO3 29.2 (H) 09/26/2020 0250   TCO2 30 09/26/2020  0250   ACIDBASEDEF 2.0 09/12/2020 1953   O2SAT 97.0 09/26/2020 0250     Coagulation Profile: Recent Labs  Lab 10/07/2020 1100 10/03/2020 1228 09/30/2020 1947 09/24/20 0400 09/24/20 1650  INR 1.6* 1.5* 0.9 1.2 1.4*    Cardiac Enzymes: No results for input(s): CKTOTAL, CKMB, CKMBINDEX, TROPONINI in the last 168 hours.  HbA1C: Hgb A1c MFr Bld  Date/Time Value Ref Range Status  09/24/2020 04:13 AM 5.2 4.8 - 5.6 % Final    Comment:    (NOTE) Pre diabetes:          5.7%-6.4%  Diabetes:              >6.4%  Glycemic control for   <7.0% adults with diabetes   11/16/2018 04:41 PM 5.6 4.8 - 5.6 % Final    Comment:             Prediabetes: 5.7 - 6.4          Diabetes: >6.4          Glycemic control for adults with diabetes: <7.0     CBG: Recent Labs  Lab 10/04/2020 1124 09/18/2020 1647 10/08/2020 1939 10/03/2020 2348 09/26/20 0350  GLUCAP 111* 113* 128* 139* 129*    CCT 40 mins  Kennieth Rad, ACNP [facility] 09/26/2020, 8:28 AM

## 2020-09-27 ENCOUNTER — Encounter (HOSPITAL_COMMUNITY): Admission: RE | Disposition: E | Payer: Self-pay | Source: Home / Self Care | Attending: Surgery

## 2020-09-27 ENCOUNTER — Inpatient Hospital Stay (HOSPITAL_COMMUNITY): Payer: Managed Care, Other (non HMO) | Admitting: Certified Registered Nurse Anesthetist

## 2020-09-27 DIAGNOSIS — A419 Sepsis, unspecified organism: Secondary | ICD-10-CM | POA: Diagnosis not present

## 2020-09-27 HISTORY — PX: LAPAROTOMY: SHX154

## 2020-09-27 LAB — POCT I-STAT 7, (LYTES, BLD GAS, ICA,H+H)
Acid-Base Excess: 0 mmol/L (ref 0.0–2.0)
Bicarbonate: 25 mmol/L (ref 20.0–28.0)
Calcium, Ion: 1.06 mmol/L — ABNORMAL LOW (ref 1.15–1.40)
HCT: 27 % — ABNORMAL LOW (ref 39.0–52.0)
Hemoglobin: 9.2 g/dL — ABNORMAL LOW (ref 13.0–17.0)
O2 Saturation: 100 %
Patient temperature: 38
Potassium: 4 mmol/L (ref 3.5–5.1)
Sodium: 145 mmol/L (ref 135–145)
TCO2: 26 mmol/L (ref 22–32)
pCO2 arterial: 45.2 mmHg (ref 32.0–48.0)
pH, Arterial: 7.355 (ref 7.350–7.450)
pO2, Arterial: 204 mmHg — ABNORMAL HIGH (ref 83.0–108.0)

## 2020-09-27 LAB — TYPE AND SCREEN
ABO/RH(D): O NEG
Antibody Screen: NEGATIVE
Unit division: 0
Unit division: 0
Unit division: 0
Unit division: 0
Unit division: 0
Unit division: 0

## 2020-09-27 LAB — BPAM RBC
Blood Product Expiration Date: 202204142359
Blood Product Expiration Date: 202204142359
Blood Product Expiration Date: 202204202359
Blood Product Expiration Date: 202204202359
Blood Product Expiration Date: 202204202359
Blood Product Expiration Date: 202204202359
ISSUE DATE / TIME: 202203160600
ISSUE DATE / TIME: 202203160600
Unit Type and Rh: 5100
Unit Type and Rh: 5100
Unit Type and Rh: 5100
Unit Type and Rh: 5100
Unit Type and Rh: 5100
Unit Type and Rh: 5100

## 2020-09-27 LAB — VANCOMYCIN, RANDOM: Vancomycin Rm: 9

## 2020-09-27 LAB — GLUCOSE, CAPILLARY
Glucose-Capillary: 141 mg/dL — ABNORMAL HIGH (ref 70–99)
Glucose-Capillary: 124 mg/dL — ABNORMAL HIGH (ref 70–99)
Glucose-Capillary: 108 mg/dL — ABNORMAL HIGH (ref 70–99)
Glucose-Capillary: 133 mg/dL — ABNORMAL HIGH (ref 70–99)
Glucose-Capillary: 138 mg/dL — ABNORMAL HIGH (ref 70–99)

## 2020-09-27 LAB — PHOSPHORUS: Phosphorus: 3.6 mg/dL (ref 2.5–4.6)

## 2020-09-27 LAB — CBC
HCT: 30.3 % — ABNORMAL LOW (ref 39.0–52.0)
Hemoglobin: 10.3 g/dL — ABNORMAL LOW (ref 13.0–17.0)
MCH: 29.6 pg (ref 26.0–34.0)
MCHC: 34 g/dL (ref 30.0–36.0)
MCV: 87.1 fL (ref 80.0–100.0)
Platelets: 82 10*3/uL — ABNORMAL LOW (ref 150–400)
RBC: 3.48 MIL/uL — ABNORMAL LOW (ref 4.22–5.81)
RDW: 16.2 % — ABNORMAL HIGH (ref 11.5–15.5)
WBC: 12.4 10*3/uL — ABNORMAL HIGH (ref 4.0–10.5)
nRBC: 1.2 % — ABNORMAL HIGH (ref 0.0–0.2)

## 2020-09-27 LAB — PROTIME-INR
INR: 1.4 — ABNORMAL HIGH (ref 0.8–1.2)
Prothrombin Time: 16.9 seconds — ABNORMAL HIGH (ref 11.4–15.2)

## 2020-09-27 LAB — COMPREHENSIVE METABOLIC PANEL
ALT: 48 U/L — ABNORMAL HIGH (ref 0–44)
AST: 49 U/L — ABNORMAL HIGH (ref 15–41)
Albumin: 1.7 g/dL — ABNORMAL LOW (ref 3.5–5.0)
Alkaline Phosphatase: 60 U/L (ref 38–126)
Anion gap: 12 (ref 5–15)
BUN: 32 mg/dL — ABNORMAL HIGH (ref 6–20)
CO2: 23 mmol/L (ref 22–32)
Calcium: 7.8 mg/dL — ABNORMAL LOW (ref 8.9–10.3)
Chloride: 109 mmol/L (ref 98–111)
Creatinine, Ser: 2.05 mg/dL — ABNORMAL HIGH (ref 0.61–1.24)
GFR, Estimated: 39 mL/min — ABNORMAL LOW (ref >60)
Glucose, Bld: 128 mg/dL — ABNORMAL HIGH (ref 70–99)
Potassium: 4.5 mmol/L (ref 3.5–5.1)
Sodium: 144 mmol/L (ref 135–145)
Total Bilirubin: 5.3 mg/dL — ABNORMAL HIGH (ref 0.3–1.2)
Total Protein: 4.3 g/dL — ABNORMAL LOW (ref 6.5–8.1)

## 2020-09-27 LAB — MAGNESIUM
Magnesium: 2 mg/dL (ref 1.7–2.4)
Magnesium: 1.8 mg/dL (ref 1.7–2.4)

## 2020-09-27 LAB — LACTIC ACID, PLASMA
Lactic Acid, Venous: 1.6 mmol/L (ref 0.5–1.9)
Lactic Acid, Venous: 4.3 mmol/L (ref 0.5–1.9)

## 2020-09-27 LAB — FIBRINOGEN: Fibrinogen: 784 mg/dL — ABNORMAL HIGH (ref 210–475)

## 2020-09-27 LAB — TRIGLYCERIDES: Triglycerides: 283 mg/dL — ABNORMAL HIGH (ref <150)

## 2020-09-27 LAB — APTT: aPTT: 39 seconds — ABNORMAL HIGH (ref 24–36)

## 2020-09-27 SURGERY — LAPAROTOMY, EXPLORATORY
Anesthesia: General | Site: Abdomen

## 2020-09-27 MED ORDER — ACETAMINOPHEN 10 MG/ML IV SOLN: 1000.0000 mg | Freq: Four times a day (QID) | INTRAVENOUS | Status: DC

## 2020-09-27 MED ORDER — VASOPRESSIN 20 UNIT/ML IV SOLN
INTRAVENOUS | Status: AC
  Filled 2020-09-27: qty 1

## 2020-09-27 MED ORDER — 0.9 % SODIUM CHLORIDE (POUR BTL) OPTIME
TOPICAL | Status: DC | PRN
  Administered 2020-09-27: 2000 mL

## 2020-09-27 MED ORDER — FENTANYL CITRATE (PF) 250 MCG/5ML IJ SOLN
INTRAMUSCULAR | Status: AC
  Filled 2020-09-27: qty 5

## 2020-09-27 MED ORDER — ROCURONIUM BROMIDE 10 MG/ML (PF) SYRINGE
PREFILLED_SYRINGE | INTRAVENOUS | Status: DC | PRN
  Administered 2020-09-27: 100 mg via INTRAVENOUS

## 2020-09-27 MED ORDER — LACTATED RINGERS IV SOLN: INTRAVENOUS | Status: DC | PRN

## 2020-09-27 MED ORDER — ALBUMIN HUMAN 5 % IV SOLN: INTRAVENOUS | Status: DC | PRN

## 2020-09-27 MED ORDER — PROPOFOL 10 MG/ML IV BOLUS
INTRAVENOUS | Status: AC
  Filled 2020-09-27: qty 20

## 2020-09-27 MED ORDER — VANCOMYCIN HCL 1750 MG/350ML IV SOLN
1750.0000 mg | INTRAVENOUS | Status: DC
  Administered 2020-09-27 – 2020-09-28 (×2): 1750 mg via INTRAVENOUS
  Filled 2020-09-27 (×3): qty 350

## 2020-09-27 MED ORDER — TRACE MINERALS CU-MN-SE-ZN 300-55-60-3000 MCG/ML IV SOLN
INTRAVENOUS | Status: AC
  Filled 2020-09-27: qty 1137.6

## 2020-09-27 MED ORDER — ACETAMINOPHEN 10 MG/ML IV SOLN
1000.0000 mg | Freq: Four times a day (QID) | INTRAVENOUS | Status: AC
  Administered 2020-09-27 – 2020-09-28 (×4): 1000 mg via INTRAVENOUS
  Filled 2020-09-27 (×4): qty 100

## 2020-09-27 MED ORDER — PROPOFOL 10 MG/ML IV BOLUS
INTRAVENOUS | Status: DC | PRN
  Administered 2020-09-27: 30 mg via INTRAVENOUS

## 2020-09-27 MED ORDER — MIDAZOLAM HCL 2 MG/2ML IJ SOLN
INTRAMUSCULAR | Status: DC | PRN
  Administered 2020-09-27: 4 mg via INTRAVENOUS
  Administered 2020-09-27: 2 mg via INTRAVENOUS

## 2020-09-27 MED ORDER — MIDAZOLAM HCL 2 MG/2ML IJ SOLN
INTRAMUSCULAR | Status: AC
  Filled 2020-09-27: qty 2

## 2020-09-27 MED ORDER — MIDAZOLAM HCL 2 MG/2ML IJ SOLN
INTRAMUSCULAR | Status: AC
  Filled 2020-09-27: qty 4

## 2020-09-27 SURGICAL SUPPLY — 30 items
COVER SURGICAL LIGHT HANDLE (MISCELLANEOUS) ×2 IMPLANT
DRAPE LAPAROSCOPIC ABDOMINAL (DRAPES) ×2 IMPLANT
DRAPE WARM FLUID 44X44 (DRAPES) ×2 IMPLANT
DRESSING MEPILEX FLEX 4X4 (GAUZE/BANDAGES/DRESSINGS) IMPLANT
DRSG AQUACEL AG ADV 3.5X14 (GAUZE/BANDAGES/DRESSINGS) ×1 IMPLANT
DRSG MEPILEX FLEX 4X4 (GAUZE/BANDAGES/DRESSINGS) ×2
ELECT CAUTERY BLADE 6.4 (BLADE) ×2 IMPLANT
ELECT REM PT RETURN 9FT ADLT (ELECTROSURGICAL) ×2
ELECTRODE REM PT RTRN 9FT ADLT (ELECTROSURGICAL) ×1 IMPLANT
GLOVE BIOGEL PI IND STRL 6 (GLOVE) ×1 IMPLANT
GLOVE BIOGEL PI INDICATOR 6 (GLOVE) ×1
GLOVE BIOGEL PI MICRO 5.5 (GLOVE) ×1
GLOVE BIOGEL PI MICRO STRL 5.5 (GLOVE) ×1 IMPLANT
GOWN STRL REUS W/ TWL LRG LVL3 (GOWN DISPOSABLE) ×2 IMPLANT
GOWN STRL REUS W/TWL LRG LVL3 (GOWN DISPOSABLE) ×6
HANDLE SUCTION POOLE (INSTRUMENTS) ×1 IMPLANT
KIT BASIN OR (CUSTOM PROCEDURE TRAY) ×2 IMPLANT
KIT TURNOVER KIT B (KITS) ×2 IMPLANT
NS IRRIG 1000ML POUR BTL (IV SOLUTION) ×4 IMPLANT
PACK GENERAL/GYN (CUSTOM PROCEDURE TRAY) ×2 IMPLANT
PAD ARMBOARD 7.5X6 YLW CONV (MISCELLANEOUS) ×2 IMPLANT
PENCIL SMOKE EVACUATOR (MISCELLANEOUS) ×2 IMPLANT
STAPLER VISISTAT 35W (STAPLE) ×1 IMPLANT
SUCTION POOLE HANDLE (INSTRUMENTS) ×2
SUT PDS AB 1 TP1 96 (SUTURE) ×4 IMPLANT
SUT SILK 2 0 SH CR/8 (SUTURE) ×2 IMPLANT
SUT SILK 2 0 TIES 10X30 (SUTURE) ×2 IMPLANT
SUT SILK 3 0 SH CR/8 (SUTURE) ×2 IMPLANT
SUT SILK 3 0 TIES 10X30 (SUTURE) ×2 IMPLANT
TOWEL GREEN STERILE (TOWEL DISPOSABLE) ×2 IMPLANT

## 2020-09-27 NOTE — Anesthesia Postprocedure Evaluation (Signed)
Anesthesia Post Note  Patient: [name]  Procedure(s) Performed: RE -EXPLORATORY LAPAROTOMY AND [place] OUT (N/A Abdomen)     Patient location during evaluation: SICU Anesthesia Type: General Level of consciousness: sedated Pain management: pain level controlled Vital Signs Assessment: post-procedure vital signs reviewed and stable Respiratory status: patient remains intubated per anesthesia plan Cardiovascular status: stable Postop Assessment: no apparent nausea or vomiting Anesthetic complications: no   No complications documented.  Last Vitals:  Vitals:   09/19/2020 1200 10/08/2020 1300  BP: (!) 107/59 (!) 96/52  Pulse: 72 74  Resp: 18 19  Temp: 37.1 C 37.5 C  SpO2: 100% 100%    Last Pain:  Vitals:   09/19/2020 1200  TempSrc: Bladder  PainSc:                  Belenda Cruise P Daneshia Tavano

## 2020-09-27 NOTE — Anesthesia Procedure Notes (Signed)
Arterial Line Insertion Start/End03/24/2022 7:58 AM, 09/28/2020 8:02 AM Performed by: Darral Dash, DO, anesthesiologist  Patient location: OR. Preanesthetic checklist: patient identified, IV checked, site marked, risks and benefits discussed, surgical consent, monitors and equipment checked, pre-op evaluation, timeout performed and anesthesia consent Lidocaine 1% used for infiltration Left, brachial was placed Catheter size: 20 Fr Hand hygiene performed , maximum sterile barriers used  and Seldinger technique used  Attempts: 1 Procedure performed using ultrasound guided technique. Ultrasound Notes:anatomy identified, needle tip was noted to be adjacent to the nerve/plexus identified, no ultrasound evidence of intravascular and/or intraneural injection and image(s) printed for medical record Following insertion, dressing applied, Biopatch and line sutured. Post procedure assessment: normal and unchanged  Patient tolerated the procedure well with no immediate complications.

## 2020-09-27 NOTE — Progress Notes (Signed)
2 Days Post-Op  Subjective: No acute changes. Remains on epi at 5-6. Creatinine downtrending. A-line dislodged overnight.   Objective: Vital signs in last 24 hours: Temp:  [100.4 F (38 C)-101.12 F (38.4 C)] 100.76 F (38.2 C) (03/19 0715) Pulse Rate:  [30-119] 83 (03/19 0715) Resp:  [16-23] 19 (03/19 0715) BP: (75-148)/(49-104) 108/49 (03/19 0715) SpO2:  [99 %-100 %] 100 % (03/19 0715) Arterial Line BP: (67-159)/(42-73) 96/59 (03/19 0345) FiO2 (%):  [30 %] 30 % (03/19 0341) Last BM Date:  (pta)  Intake/Output from previous day: 03/18 0701 - 03/19 0700 In: 6151.7 [I.V.:4969.3; IV Piggyback:1182.4] Out: 3700 [Urine:2100; Drains:1600] Intake/Output this shift: No intake/output data recorded.  PE: General: intubated, sedated. Moves purposefully when stimulated. HEENT: OG in place draining blood-tinged fluid. ETT in place. Scleral icterus. CV: RRR Resp: intubated, on vent Abdomen: Soft, nondistended. Abthera in place with thin serosanguinous drainage.  LUQ pancreatic stent with clear colorless fluid. GU: foley in place, draining clear urine  Lab Results:  Recent Labs    09/18/2020 0427 09/18/2020 0910 09/26/20 0250 09/26/20 0522  WBC 6.4  --   --  8.7  HGB 11.7*   < > 9.5* 10.8*  HCT 32.6*   < > 28.0* 31.0*  PLT 96*  --   --  85*   < > = values in this interval not displayed.   BMET Recent Labs    09/26/20 0522 09/26/2020 0553  NA 143 144  K 4.0 4.5  CL 108 109  CO2 26 23  GLUCOSE 157* 128*  BUN 34* 32*  CREATININE 2.39* 2.05*  CALCIUM 7.8* 7.8*   PT/INR Recent Labs    09/24/20 1650  LABPROT 16.8*  INR 1.4*   CMP     Component Value Date/Time   NA 144 09/12/2020 0553   NA 142 07/21/2020 0921   K 4.5 09/30/2020 0553   CL 109 09/30/2020 0553   CO2 23 09/09/2020 0553   GLUCOSE 128 (H) 09/14/2020 0553   BUN 32 (H) 10/04/2020 0553   BUN 16 07/21/2020 0921   CREATININE 2.05 (H) 09/12/2020 0553   CREATININE 1.16 03/01/2016 1607   CALCIUM 7.8 (L)  09/30/2020 0553   PROT 4.3 (L) 09/21/2020 0553   PROT 6.7 07/21/2020 0921   ALBUMIN 1.7 (L) 09/14/2020 0553   ALBUMIN 4.1 07/21/2020 0921   AST 49 (H) 10/04/2020 0553   ALT 48 (H) 09/13/2020 0553   ALKPHOS 60 09/26/2020 0553   BILITOT 5.3 (H) 09/15/2020 0553   BILITOT 0.8 07/21/2020 0921   GFRNONAA 39 (L) 10/08/2020 0553   GFRNONAA >89 09/22/2015 1008   GFRAA 82 07/21/2020 0921   GFRAA >89 09/22/2015 1008   Lipase     Component Value Date/Time   LIPASE 23 08/22/2020 0318       Studies/Results: DG Chest Port 1 View  Result Date: 09/26/2020 CLINICAL DATA:  Respiratory failure. EXAM: PORTABLE CHEST 1 VIEW COMPARISON:  09/28/2020 FINDINGS: Stable heart size and appearance of pacing/ICD device. Stable positioning of endotracheal tube with tip approximately 3 cm above the carina. Gastric decompression tube extends below the diaphragm. Lungs show stable mild vascular congestion without evidence of overt airspace edema or pleural fluid. No pneumothorax identified. IMPRESSION: Stable mild vascular congestion. Electronically Signed   By: Aletta Edouard M.D.   On: 09/26/2020 07:55   ECHOCARDIOGRAM COMPLETE  Result Date: 09/26/2020    ECHOCARDIOGRAM REPORT   Patient Name:   LLEYTON BYERS Date of Exam: 09/26/2020 Medical Rec #:  [id number]       Height:       74.0 in Accession #:    [id number]      Weight:       210.0 lb Date of Birth:  December 25, 1970      BSA:          2.219 m Patient Age:    50 years        BP:           108/62 mmHg Patient Gender: M               HR:           70 bpm. Exam Location:  Inpatient Procedure: 2D Echo Indications:    cardiomyopathy  History:        Patient has prior history of Echocardiogram examinations, most                 recent 05/25/2020. Defibrillator; Risk Factors:Hypertension and                 Dyslipidemia.  Sonographer:    Johny Chess Referring Phys: [id number] RAVI AGARWALA  Sonographer Comments: Echo performed with patient supine and on artificial  respirator and Technically difficult study due to poor echo windows. IMPRESSIONS  1. Left ventricular ejection fraction, by estimation, is 40 to 45%. The left ventricle has mildly decreased function. The left ventricle demonstrates global hypokinesis. Left ventricular diastolic parameters are consistent with Grade I diastolic dysfunction (impaired relaxation).  2. Right ventricular systolic function is normal. The right ventricular size is normal. Tricuspid regurgitation signal is inadequate for assessing PA pressure.  3. The mitral valve is grossly normal. Trivial mitral valve regurgitation.  4. The aortic valve is grossly normal. Aortic valve regurgitation is not visualized. No aortic stenosis is present.  5. The inferior vena cava is normal in size with greater than 50% respiratory variability, suggesting right atrial pressure of 3 mmHg. Comparison(s): A prior study was performed on 05/25/20. Prior images reviewed side by side. LV function has decreased slightly. FINDINGS  Left Ventricle: Wall thickness not well visualized for measurement. Left ventricular ejection fraction, by estimation, is 40 to 45%. The left ventricle has mildly decreased function. The left ventricle demonstrates global hypokinesis. The left ventricular internal cavity size was normal in size. Left ventricular diastolic parameters are consistent with Grade I diastolic dysfunction (impaired relaxation). Right Ventricle: The right ventricular size is normal. Right vetricular wall thickness was not well visualized. Right ventricular systolic function is normal. Tricuspid regurgitation signal is inadequate for assessing PA pressure. Left Atrium: Left atrial size was normal in size. Right Atrium: Right atrial size was normal in size. Pericardium: There is no evidence of pericardial effusion. Mitral Valve: The mitral valve is grossly normal. Trivial mitral valve regurgitation. Tricuspid Valve: The tricuspid valve is grossly normal. Tricuspid valve  regurgitation is trivial. Aortic Valve: The aortic valve is grossly normal. Aortic valve regurgitation is not visualized. No aortic stenosis is present. Pulmonic Valve: The pulmonic valve was not well visualized. Pulmonic valve regurgitation is trivial. Aorta: The aortic root is normal in size and structure. Venous: The inferior vena cava is normal in size with greater than 50% respiratory variability, suggesting right atrial pressure of 3 mmHg. IAS/Shunts: The interatrial septum was not well visualized. Additional Comments: A device lead is visualized in the right atrium and right ventricle.  LEFT VENTRICLE PLAX 2D LVIDd:         4.40 cm     Diastology  LVIDs:         3.60 cm     LV e' medial:    7.18 cm/s LVOT diam:     2.00 cm     LV E/e' medial:  8.6 LV SV:         57          LV e' lateral:   13.50 cm/s LV SV Index:   26          LV E/e' lateral: 4.6 LVOT Area:     3.14 cm  LV Volumes (MOD) LV vol d, MOD A2C: 98.0 ml LV vol d, MOD A4C: 79.6 ml LV vol s, MOD A2C: 51.7 ml LV vol s, MOD A4C: 52.3 ml LV SV MOD A2C:     46.3 ml LV SV MOD A4C:     79.6 ml LV SV MOD BP:      37.0 ml RIGHT VENTRICLE             IVC RV S prime:     15.80 cm/s  IVC diam: 1.30 cm TAPSE (M-mode): 1.7 cm LEFT ATRIUM             Index       RIGHT ATRIUM           Index LA diam:        3.10 cm 1.40 cm/m  RA Area:     13.80 cm LA Vol (A2C):   48.5 ml 21.86 ml/m RA Volume:   31.90 ml  14.38 ml/m LA Vol (A4C):   40.2 ml 18.12 ml/m LA Biplane Vol: 46.4 ml 20.91 ml/m  AORTIC VALVE LVOT Vmax:   104.00 cm/s LVOT Vmean:  70.000 cm/s LVOT VTI:    0.182 m  AORTA Ao Root diam: 2.80 cm Ao Asc diam:  2.90 cm MITRAL VALVE MV Area (PHT): 3.37 cm    SHUNTS MV Decel Time: 225 msec    Systemic VTI:  0.18 m MV E velocity: 62.10 cm/s  Systemic Diam: 2.00 cm MV A velocity: 92.50 cm/s MV E/A ratio:  0.67 Cherlynn Kaiser MD Electronically signed by Cherlynn Kaiser MD Signature Date/Time: 09/26/2020/12:41:30 PM    Final       Assessment/Plan 50 yo male  with a neuroendocrine tumor of the head of the pancreas, POD5 s/p right hemicolectomy, end ileostomy, Whipple and patch angioplasty repair of SMV. Complicated by postoperative hemorrhagic shock with DIC with extensive small bowel necrosis. - Return to OR this morning for re-exploration and Abthera change - Continue TPN and IV fluid hydration - Wean epi as tolerated - Appreciate CCM assistance with vent management - Continue antibiotics - VTE: chemical DVT ppx on hold due to thrombocytopenia - Dispo: ICU   LOS: 5 days    Michaelle Birks, MD [facility] Surgery General, Hepatobiliary and Pancreatic Surgery 10/07/2020 7:25 AM

## 2020-09-27 NOTE — Progress Notes (Signed)
PHARMACY - TOTAL PARENTERAL NUTRITION CONSULT NOTE  Indication:  Expected prolonged ileus  Patient Measurements: Height: 6\' 2"  (188 cm) Weight: 95.3 kg (210 lb) IBW/kg (Calculated) : 82.2 TPN AdjBW (KG): 95.3 Body mass index is 26.96 kg/m.  Assessment:  3 YOM with history of mass adjacent to head of the pancreas in 2021.  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  Developed cholecystitis in Feb 2022 and had percutaneous cholecystectomy tube placement.  Presented this admission for neuroendocrine tumor resection with Whipple procedure, also had right total colectomy, colostomy and cholecystectomy on 09/30/2020.  SMV was lacerated during procedure and repaired by bovine pericardial patch.  Developed hemorrhagic shock with bile leak post-op and underwent placement of biliary T tube with abdominal washout on 3/15 PM.  Abdomen remains open.  Pharmacy consulted to manage TPN.  Glucose / Insulin: no hx DM, A1c 5.2% - hypoglycemic on 3/16 PM. CBGs <180, 6 units SSI used in last 24h Electrolytes: Na 144, K 4.5 (goal >/= 4), Phos 3.6, CoCa WNL (iCa low at 1.05), Mag 2 Renal: AKI, SCr down 2.05 (BL ~1-1.2), BUN down to 32 Hepatic: AST/ALT 75/84>49/48, tbili up to 5.3, TG elevated at 283 (down trend, off propofol).  Albumin 2.3>>1.7, BL prealbumin 11.8 Intake / Output; MIVF: UOP 0.9 ml/kg/hr, drain 1600L, no stool OP, EBL ~12L on 3/15-3/16 + 700 ml, (concentrate TPN per CCM) GI Imaging: none since TPN start GI Surgeries / Procedures:  3/14 Whipple procedure with en-bloc right hemicolectomy and end ileostomy, cholecystectomy 3/15 OR for washout, VAC placement 3/17 OR for washout, VAC replacement.  Unlikely to close abdomen d/t edema - necrosis of the entire small bowel except for the PB limb and proximal 40-50cm of jejunum 3/19 - OR pending for re-explore  Central access: CVC double lumen placed 09/10/2020 TPN start date: 09/26/19  Nutritional Goals (RD rec on  3/18): 2500-2900 kCal and 165-190g AA per day, fluid > 2L/day Goal TPN is 90 ml/hr with 24g/L ILE (ILE 20% of total kCal) = 170g AA, 400g CHO and 52g ILE for a total of 2559 kCal per day  Current Nutrition:  NPO  Plan:  Continue concentrated TPN at goal rate of 90 ml/hr. This TPN provides 170 g of protein, 400 g of dextrose for a total of 2041 kcals meeting 100% protein need, 81% of kcal needs Continue with no lipid in TPN today due to elevated TG. Propofol remains off. Electrolytes in TPN: no Na for now, decr K 58mEq/L, Ca 90mEq/L, Mag 35mEq/L, no Phos, max acetate  Add MVI, decr to 1/2 trace elements (scleral icterus noted) Continue moderate SSI Q4H F/U AM labs, CBGs, TG  Thank you for involving pharmacy in this patient's care.  Renold Genta, PharmD, BCPS Clinical Pharmacist Clinical phone for 10/07/2020 until 3p is [phone] 09/21/2020 7:06 AM  **Pharmacist phone directory can be found on [email or web address] listed under [place]**

## 2020-09-27 NOTE — Op Note (Signed)
Date: 09/18/2020  Patient: [name] MRN: [id number]  Preoperative Diagnosis: Open abdomen with small bowel necrosis following a Whipple and right colectomy Postoperative Diagnosis: Same  Procedure: Re-opening of recent laparotomy, abdominal closure  Surgeon: Michaelle Birks, MD Assistant: Romana Juniper, MD  EBL: Minimal  Anesthesia: General  Specimens: None  Indications: [name] is a 50 yo male who underwent a Whipple 5 days ago for a large neuroendocrine tumor at the head of the pancreas and root of the small bowel mesentery, whose postop course was complicated by hemorrhagic shock and DIC requiring re-exploration and massive resuscitation. His abdomen was left open and bleeding and acidosis have since been corrected. He developed diffuse small bowel necrosis and underwent extensive small bowel resection at his last exploration 2 days ago. He was brought back to the OR today for another exploration.  Findings: Necrosis of half the remaining 40cm of small bowel distal to the DJ anastomosis, with diffuse patchy necrosis of the rest of the small bowel including the pancreaticobiliary limb.  Procedure details: Informed consent was obtained from the patient's family prior to the procedure. The patient had already been intubated with lines in place. The abdomen was prepped and draped in the usual sterile fashion. A pre-procedure timeout was taken verifying patient identity, surgical site and procedure to be performed.  The [name] was removed. The remaining small bowel was very edematous, with frank necrosis of the distal half. There were diffuse patchy areas of obvious necrosis of the remaining small bowel and of the entire pancreaticobiliary limb. The liver was pale. The remaining colon was pale in color but appeared viable. The stomach was still viable. At this point I did not feel the situation was survivable as the patient would only have 20cm of remaining alimentary limb, and a  significant portion of that 20cm was necrotic. In addition the PB limb had areas of necrosis, the bile duct anastomosis was not properly functioning as indicated by the development of jaundice, and there was not enough healthy bowel remaining to reconstruct the biliary anastomosis. Dr. Kae Heller and I had a discussion and both agreed that this was not survivable and opted not to perform any further interventions or bowel resections. The fascia was closed with a running looped 1 PDS and the skin was closed with staples. A clean dressing was applied.  All counts were correct x2 at the end of the procedure. The patient remained intubated and was transported to the ICU for further care and planned palliative discussions with the family.  Michaelle Birks, MD 10/07/2020 9:03 AM

## 2020-09-27 NOTE — Progress Notes (Signed)
A-line positional, unable to pull blood back, RT called and A-line was partially pulled out under an intact dressing, pt has been restless multiple times throughout the night.   RT will attempt to place new A-line, E-link aware. Cuff pressures have aligned with A-line pressures throughout the night. Cuff pressures to go off every 15 minutes, will continue to monitor.

## 2020-09-27 NOTE — Progress Notes (Signed)
Critical care attending  Asked to participate in goals of care conversation with patient's family. They have been informed that the patient has such extensive bowel necrosis that he would have to undergo an unsurvivable small bowel resection. The family nevertheless is requesting a second opinion. I have agreed to contact [facility] but I did make it clear to the family that we would only seek a single second opinion, and in the very likely case that they would concur with [place], the family should be then prepare to transition to comfort care.   CRITICAL CARE Performed by: Kipp Brood   Total critical care time: 30 minutes  Critical care time was exclusive of separately billable procedures and treating other patients.  Critical care was necessary to treat or prevent imminent or life-threatening deterioration.  Critical care was time spent personally by me on the following activities: development of treatment plan with patient and/or surrogate as well as nursing, discussions with consultants, evaluation of patient's response to treatment, examination of patient, obtaining history from patient or surrogate, ordering and performing treatments and interventions, ordering and review of laboratory studies, ordering and review of radiographic studies, pulse oximetry, re-evaluation of patient's condition and participation in multidisciplinary rounds.  Kipp Brood, MD [facility] ICU Physician [place]  Pager: [phone] Mobile: [phone] After hours: [phone].

## 2020-09-27 NOTE — Progress Notes (Signed)
I had a meeting with the patient's family with the assistance of a phone interpreter. I explained that unfortunately Izayiah's remaining small intestine is dead, and that he cannot ultimately survive without any viable small intestine. They are understandably devastated. I explained that if we continue supportive measures he will likely progress in the next few days to septic shock and multiorgan failure. I also discussed comfort care measures to focus on making Gilliam's remaining time as peaceful as possible. The family was very clear that they want to continue supporting him as long as possible for religious reasons. We will continue our current supportive measures of TPN, IV fluids, pressors and antibiotics for now. We will also bring up code status with the family but keep the patient full code for now. As the family has more time to process this in the next few days, we will have further discussions with them regarding end of life care.  Michaelle Birks, MD [facility] Surgery General, Hepatobiliary and Pancreatic Surgery 09/09/2020 10:07 AM

## 2020-09-27 NOTE — Progress Notes (Signed)
NAME:  [name], MRN:  [id number], DOB:  1971-04-10, LOS: 5 ADMISSION DATE:  09/21/2020, CONSULTATION DATE:  09/14/2020 REFERRING MD:  Anesthesia, CHIEF COMPLAINT:  Whipple, post-op shock   Brief History:  50 y.o. M with PMH of neuroendocrine tumor and underwent Whipple procedure 3/14 with intra-operative SMV laceration and repair by vascular with bovine pericardial patch.  Pt had worsening shock overnight and required three pressors despite massive transfusion protocol (received 150 blood products).  He was taken back to the OR for washout on 3/15 and returned to the ICU intubated.  Taken back 3/17 again found to have small bowel necrosis s/p resection.  Plans again to return 3/19 to OR.   Past Medical History:   has a past medical history of AICD (automatic cardioverter/defibrillator) present, Dyslipidemia, Heart failure with mildly reduced EF, History of kidney stones, HTN (hypertension), ventricular fibrillation, and Nonischemic cardiomyopathy.   Significant Hospital Events:  3/14 Admit to Surgery, to OR for whipple, SMV injury and repair, massive transfusion protocol overnight  3/15 Back to OR for washout, x2. IR for arteriogram. No major bleed source identified in OR, or arterial bleed w IR. Robust product resuscitation >75 products (+ TXA + novoseven) 3/16 off pressors, add'l 39 products overnight. Slowing resuscitation this morning with hemodynamic improvements and slowing drain output; OR x 2 - washout, biliary t tube, wound vac -- washout, wound vac, IR for arteriogram -- no arterial bleed for embolization 3/17 Aggressive balanced transfusion continue overnight with marked hemodynamic improvement since yesterday evening. Off pressors x several hours, Wound vac output significantly slowing (from 578ml q15-5min to 569ml q1hr+) and output is much thinner, Thin bloody secretions from mouth approx 259ml, Decreased RR from 22 to 18, Unable to tolerate NE- severe bradycardia  Consults:   PCCM VVS  IR   Procedures:  3/15 ETT 3/15 R IJ cordis/ CVL>> 3/15 left aline >>  Significant Diagnostic Tests:  3/15 mesenteric arteriogram> no identified arterial source of bleed 3/16 OR wash out, wound vac, IR for ateriogram 3/17 Remains on Epi 6 mcg/min and vasopressin, following some commands, back to OR found to have small bowel necrosis s/p resection, sCr 2.7-> 3.1, Wound vac 1.7L/ 24hr, Hgb stable 11.5-> 11.7  Micro Data:  3/15 MRSA>>negative  Antimicrobials:  Ancef 3/14 Flagyl 3/14 3/17 flagyl >> 3/17 ceftriaxone >> 3/18 vanc >>  Interim History / Subjective:   Laparotomy today reveals continued necrosis of the small bowel. Deemed to be non-viable situation.  Objective   Blood pressure (!) 105/59, pulse 71, temperature 98.6 F (37 C), resp. rate 18, height 6\' 2"  (1.88 m), weight 95.3 kg, SpO2 100 %.    Vent Mode: PRVC FiO2 (%):  [30 %] 30 % Set Rate:  [18 bmp] 18 bmp Vt Set:  [580 mL] 580 mL PEEP:  [5 cmH20] 5 cmH20 Plateau Pressure:  [20 cmH20-22 cmH20] 22 cmH20   Intake/Output Summary (Last 24 hours) at 10/02/2020 1200 Last data filed at 10/06/2020 1139 Gross per 24 hour  Intake 4995.15 ml  Output 4465 ml  Net 530.15 ml   Filed Weights   09/16/2020 0639  Weight: 95.3 kg    Examination: General:  Critically ill adult male in NAD HEENT: MM pink/moist, ETT/ OGT - dark bloody drainage, pupils 3/reactive, scleral icterus present Neuro: sedated but will attempt to open eyes, f/c commands- wiggles toes and gives thumbs up, nods approprietly to questions CV: rr, no murmur PULM:  MV supported breaths, thick tan secretions this morning, now cleared,  CTA, no wheeze GI: less distended, wound vac remains, dressing over prior ileostomy site, left drain remains, foley- amber urine Extremities: warm/dry, overall appears to have mildly improved anasarca  Skin: no rashes   Resolved Hospital Problem list     Assessment & Plan:   Critically ill due to acute  respiratory failure with hypoxia, requiring mechanical ventilaion Status post Whipple for neuroendocrine tumor Hemorrhagic shock-now resolved Critically ill due to distributive shock due small bowel infarction.  Compensated nonischemic cardiomyopathy with ICD.   AKI  Neuroendocrine tumor s/p whipple and r hemicolectomy, smv repair (3/14); s/p washout x 2 with biliary t tube placement, wound vac application 2/09   Plan:  - Continue current hemodynamic support pending family decision regarding transition to comfort care.    Daily Goals Checklist  Pain/Anxiety/Delirium protocol (if indicated): Precedex, fentanyl, prn versed. VAP protocol (if indicated): bundle in place.  Respiratory support goals: Full ventilatory support Blood pressure target: Titrate epinephrine to keep MAP 65. DVT prophylaxis: SCDs Nutrition Status: On TPN GI prophylaxis: Pantoprazole Fluid status goals: Allow autoregulation Urinary catheter: Assessment of intravascular volume Central lines: Right IJ central line still needed Glucose control: Adequate control on sliding scale insulin Mobility/therapy needs: Bedrest Antibiotic de-escalation: Continue ceftriaxone and vancomycin Restraints: Restraint type: Soft restraint:  bilateral wrist Reason for restraints: Interference with medical treatment Home medication reconciliation: On hold Daily labs: CBC, CMP daily Code Status: Full code Family Communication: Updated by Dr. Zenia Resides.  Goals of care discussion Disposition: ICU.   Goals of Care:  Last date of multidisciplinary goals of care discussion: per primary Family and staff present:  Summary of discussion:  Follow up goals of care discussion due: per primary Code Status:  Full Code.   No family at bedside on rounds.  Updated per primary team  Labs   CBC: Recent Labs  Lab 09/24/20 1219 09/24/20 1650 09/18/2020 0427 09/10/2020 0910 09/26/2020 1520 09/26/20 0250 09/26/20 0522 10/04/2020 0721  09/14/2020 0814  WBC 3.3* 3.9* 6.4  --   --   --  8.7 12.4*  --   NEUTROABS  --   --  5.2  --   --   --   --   --   --   HGB 11.7* 11.5* 11.7*   < > 9.9* 9.5* 10.8* 10.3* 9.2*  HCT 33.0* 32.1* 32.6*   < > 29.0* 28.0* 31.0* 30.3* 27.0*  MCV 84.2 83.6 84.7  --   --   --  86.8 87.1  --   PLT 69* 69*  68* 96*  --   --   --  85* 82*  --    < > = values in this interval not displayed.    Basic Metabolic Panel: Recent Labs  Lab 09/24/20 0400 09/24/20 0652 09/24/20 1219 09/24/20 1650 10/07/2020 0427 10/06/2020 0910 09/12/2020 1520 09/17/2020 2249 09/26/20 0250 09/26/20 0522 09/26/20 1541 10/04/2020 0553 10/06/2020 0814  NA 146*   < > 146*  --  145   < > 146*  --  145 143  --  144 145  K 3.9   < > 3.7  --  3.2*   < > 4.1  --  4.2 4.0  --  4.5 4.0  CL 107  --  106  --  106  --   --   --   --  108  --  109  --   CO2 24  --  27  --  25  --   --   --   --  26  --  23  --   GLUCOSE 101*  --  89  --  160*  --   --   --   --  157*  --  128*  --   BUN 22*  --  25*  --  34*  --   --   --   --  34*  --  32*  --   CREATININE 2.61*  --  2.70*  --  3.10*  --   --   --   --  2.39*  --  2.05*  --   CALCIUM 8.0*  --  8.2*  --  7.7*  --   --   --   --  7.8*  --  7.8*  --   MG 1.8  --   --    < > 2.3  --   --  1.9  --  2.1 1.9 2.0  --   PHOS  --   --   --   --  6.4*  --   --   --   --  5.0*  --  3.6  --    < > = values in this interval not displayed.   GFR: Estimated Creatinine Clearance: 50.7 mL/min (A) (by C-G formula based on SCr of 2.05 mg/dL (H)). Recent Labs  Lab 09/24/20 1650 09/10/2020 0427 09/13/2020 0955 09/26/20 0522 09/26/20 1208 09/26/20 1541 09/26/20 1941 09/26/20 2341 09/17/2020 0651 10/04/2020 0721  WBC 3.9* 6.4  --  8.7  --   --   --   --   --  12.4*  LATICACIDVEN  --   --    < >  --    < > 1.6 1.7 1.6 4.3*  --    < > = values in this interval not displayed.    Liver Function Tests: Recent Labs  Lab 09/24/20 0400 09/24/20 1219 09/26/2020 0427 09/26/20 0522 09/14/2020 0553  AST 369*  290* 205* 75* 49*  ALT 104* 96* 134* 84* 48*  ALKPHOS 40 46 57 58 60  BILITOT 2.5* 2.7* 3.7* 4.3* 5.3*  PROT 4.4* 4.6* 4.7* 4.2* 4.3*  ALBUMIN 2.5* 2.5* 2.3* 1.8* 1.7*   No results for input(s): LIPASE, AMYLASE in the last 168 hours. No results for input(s): AMMONIA in the last 168 hours.  ABG    Component Value Date/Time   PHART 7.355 09/09/2020 0814   PCO2ART 45.2 09/24/2020 0814   PO2ART 204 (H) 10/07/2020 0814   HCO3 25.0 09/28/2020 0814   TCO2 26 10/05/2020 0814   ACIDBASEDEF 2.0 10/04/2020 1953   O2SAT 100.0 10/02/2020 0814     Coagulation Profile: Recent Labs  Lab 10/09/2020 1100 09/24/2020 1228 10/01/2020 1947 09/24/20 0400 09/24/20 1650  INR 1.6* 1.5* 0.9 1.2 1.4*    Cardiac Enzymes: No results for input(s): CKTOTAL, CKMB, CKMBINDEX, TROPONINI in the last 168 hours.  HbA1C: Hgb A1c MFr Bld  Date/Time Value Ref Range Status  09/26/2020 04:13 AM 5.2 4.8 - 5.6 % Final    Comment:    (NOTE) Pre diabetes:          5.7%-6.4%  Diabetes:              >6.4%  Glycemic control for   <7.0% adults with diabetes   11/16/2018 04:41 PM 5.6 4.8 - 5.6 % Final    Comment:             Prediabetes: 5.7 - 6.4  Diabetes: >6.4          Glycemic control for adults with diabetes: <7.0     CBG: Recent Labs  Lab 09/26/20 1609 09/26/20 1950 09/26/20 2335 09/21/2020 0336 09/09/2020 1158  GLUCAP 118* 146* 126* 141* 124*    CRITICAL CARE Performed by: Kipp Brood   Total critical care time: 45 minutes  Critical care time was exclusive of separately billable procedures and treating other patients.  Critical care was necessary to treat or prevent imminent or life-threatening deterioration.  Critical care was time spent personally by me on the following activities: development of treatment plan with patient and/or surrogate as well as nursing, discussions with consultants, evaluation of patient's response to treatment, examination of patient, obtaining history from  patient or surrogate, ordering and performing treatments and interventions, ordering and review of laboratory studies, ordering and review of radiographic studies, pulse oximetry, re-evaluation of patient's condition and participation in multidisciplinary rounds.  Kipp Brood, MD [facility] ICU Physician [place]  Pager: [phone] Mobile: [phone] After hours: [phone].   09/21/2020, 12:00 PM

## 2020-09-27 NOTE — Progress Notes (Addendum)
Situation: On-call chaplain responding to page to attend a family meeting for pt [name]. Chaplain arrived to consult room after the meeting had already begun. Dr. Bobbye Morton and Dr. Zenia Resides were both present.  Background: Facts: Per nurse, [name] had a "whipple procedure" that had "complications" which at this point are "not survivable".  Family: 3 family members were present, one being Mr. Lawrance's brother (per physician). Chaplain was unable to learn other's names/relationships at this time.  Feelings: Family expressed shock and grief, shedding tears at the news that [name]'s complications were "not survivable."  Family also shared that "you don't understand how difficult a time this is for [place] right now." They shared that one of them had just lost his father a couple weeks ago. They also shared about [name] that "He is a good man" sharing that 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 Family expressed a desire to visit with [name] and went to his room. After a brief visit, family left the unit to have time together as a family.  Faith: One family member expressed a belief in "our faith" and a desire to keep the current medical support being provided at this time. He also expressed "God knows his time." and "we will keep him alive until God takes him."  Per his chart, [name] identifies as Muslim; however, chaplain was unable to confirm at this time.  Actions & Assessments: Family seems to be experiencing shock and grief, and perhaps trying to make sense of why this is happening to [name] who is a good man and only 50 years old.   Chaplain offered compassionate presence, acknowledging the grief and difficulty of this news.  Recommendations: For Chaplains: Confirm patient's family relationships. Offer faith-specific support as needed. Continue to hold space for anticipatory grief. For Staff: Continue to allow family time to  process this news and be ready for changing family needs as they continue to process. More family may come to visit, and the family and/or patient's faith may require community clergy to be present sometime in the near future. Chaplains remain available to help coordinate spiritual needs.  Chaplain remains available for follow-up spiritual/emotional support as needed.  Rev. Susanne Borders, MDiv      10/08/2020 1000  Clinical Encounter Type  Visited With Family  Visit Type Initial;Post-op  Referral From Physician  Spiritual Encounters  Spiritual Needs Emotional

## 2020-09-27 NOTE — Progress Notes (Signed)
RT attempted to replace pt. Art-line but was unsuccessful. MD and RN aware.

## 2020-09-27 NOTE — Anesthesia Preprocedure Evaluation (Signed)
Anesthesia Evaluation  Patient identified by MRN, date of birth, ID band Patient unresponsive    Reviewed: Allergy & Precautions, NPO status , Patient's Chart, lab work & pertinent test results, reviewed documented beta blocker date and time   Airway Mallampati: Intubated  TM Distance: >3 FB Neck ROM: Full    Dental  (+) Teeth Intact, Dental Advisory Given   Pulmonary asthma , former smoker,    breath sounds clear to auscultation   + intubated    Cardiovascular hypertension, Pt. on medications and Pt. on home beta blockers +CHF  Normal cardiovascular exam+ dysrhythmias Ventricular Fibrillation + Cardiac Defibrillator  Rhythm:Regular Rate:Normal     Neuro/Psych negative psych ROS   GI/Hepatic Neg liver ROS,   Endo/Other  negative endocrine ROS  Renal/GU      Musculoskeletal   Abdominal (+)  Abdomen: soft.    Peds  Hematology  (+) Blood dyscrasia (Thrombocytopenia), anemia ,   Anesthesia Other Findings Neuroendocrine tumor of the head of the pancreas POD #5 for right hemicolectomy, end ileostomy, Whipple and patch angioplasty repair of SMV complicated by DIC and shock requiring vasopressor support. Here for takeback ex lap after 09/09/2020 OR takeback notable for almost complete SB necrosis.  Reproductive/Obstetrics                             Anesthesia Physical  Anesthesia Plan  ASA: IV  Anesthesia Plan: General   Post-op Pain Management:    Induction: Intravenous  PONV Risk Score and Plan: 2 and Propofol infusion and Treatment may vary due to age or medical condition  Airway Management Planned: Oral ETT  Additional Equipment: Arterial line and CVP  Intra-op Plan:   Post-operative Plan: Post-operative intubation/ventilation  Informed Consent: I have reviewed the patients History and Physical, chart, labs and discussed the procedure including the risks, benefits and alternatives  for the proposed anesthesia with the patient or authorized representative who has indicated his/her understanding and acceptance.     Dental advisory given  Plan Discussed with: CRNA  Anesthesia Plan Comments: (ECHO 09/26/20: IMPRESSIONS  1. Left ventricular ejection fraction, by estimation, is 40 to 45%. The  left ventricle has mildly decreased function. The left ventricle  demonstrates global hypokinesis. Left ventricular diastolic parameters are  consistent with Grade I diastolic  dysfunction (impaired relaxation).  2. Right ventricular systolic function is normal. The right ventricular  size is normal. Tricuspid regurgitation signal is inadequate for assessing  PA pressure.  3. The mitral valve is grossly normal. Trivial mitral valve  regurgitation.  4. The aortic valve is grossly normal. Aortic valve regurgitation is not  visualized. No aortic stenosis is present.  5. The inferior vena cava is normal in size with greater than 50%  respiratory variability, suggesting right atrial pressure of 3 mmHg.   Comparison(s): A prior study was performed on 05/25/20. Prior images  reviewed side by side. LV function has decreased slightly.   Lab Results      Component                Value               Date                      WBC                      8.7  09/26/2020                HGB                      10.8 (L)            09/26/2020                HCT                      31.0 (L)            09/26/2020                MCV                      86.8                09/26/2020                PLT                      85 (L)              09/26/2020           Lab Results      Component                Value               Date                      NA                       143                 09/26/2020                K                        4.0                 09/26/2020                CO2                      26                  09/26/2020                GLUCOSE                   157 (H)             09/26/2020                BUN                      34 (H)              09/26/2020                CREATININE               2.39 (H)            09/26/2020                CALCIUM  7.8 (L)             09/26/2020                GFRNONAA                 32 (L)              09/26/2020                GFRAA                    82                  07/21/2020          )        Anesthesia Quick Evaluation

## 2020-09-27 NOTE — Progress Notes (Signed)
Dr. Bobbye Morton aware of temp, IV tylenol ordered.

## 2020-09-27 NOTE — Progress Notes (Signed)
Pt's middle abdomen incision started leaking serosanguinous drainage, trauma notified. See new orders

## 2020-09-27 NOTE — Progress Notes (Signed)
Pharmacy Antibiotic Note  [name] is a 50 y.o. male admitted on 09/21/2020 for neuroendocrine tumor s/p Whipple procedure and cholecystectomy, right colectomy, colostomy.  SMA lacerated intra-op s/p bovine pericardial patch on 09/11/2020.  Hemorrhagic shock post-op s/p massive transfusion protocol.  S/p washout with biliary tube placement on 10/01/2020.  Abdomen remains open.  Pharmacy has been consulted for vancomycin dosing for intra-abdominal coverage.  Also starting Rocephin and Flagyl.  Re-opening of recent laparotomy with necrotic SB thought to be not survivable on 3/19. Family wishes to continue IV abx. Renal function improved, Tm 101.1, WBC up 12.4. Random vancomycin level is 9 and subtherapeutic.  Plan: Vancomycin 1750 mg IV q24h for AUC 479.7 using SCr 2.05 CTX 2gm IV q24h Flagyl 500mg  IV q8h Monitor renal fxn, vanc level as indicated  Height: 6\' 2"  (188 cm) Weight: 95.3 kg (210 lb) IBW/kg (Calculated) : 82.2  Temp (24hrs), Avg:100.4 F (38 C), Min:98.24 F (36.8 C), Max:101.12 F (38.4 C)  Recent Labs  Lab 09/24/20 0400 09/24/20 1219 09/24/20 1650 09/24/2020 0427 10/09/2020 0955 09/26/20 0522 09/26/20 1208 09/26/20 1541 09/26/20 1941 09/26/20 2341 10/07/2020 0553 09/16/2020 0651 09/30/2020 0721 09/19/2020 1200  WBC 2.3* 3.3* 3.9* 6.4  --  8.7  --   --   --   --   --   --  12.4*  --   CREATININE 2.61* 2.70*  --  3.10*  --  2.39*  --   --   --   --  2.05*  --   --   --   LATICACIDVEN  --   --   --   --    < >  --  1.7 1.6 1.7 1.6  --  4.3*  --   --   VANCORANDOM  --   --   --   --   --   --   --   --   --   --   --   --   --  9   < > = values in this interval not displayed.    Estimated Creatinine Clearance: 50.7 mL/min (A) (by C-G formula based on SCr of 2.05 mg/dL (H)).    Allergies  Allergen Reactions  . Ace Inhibitors Cough    Vanc 3/17 >> CTX 3/17 >> Flagyl 3/17 >>  3/14 MRSA PCR - negative  Thank you for involving pharmacy in this patient's  care.  Renold Genta, PharmD, BCPS Clinical Pharmacist Clinical phone for 09/18/2020 until 3p is [phone] 10/06/2020 1:50 PM  **Pharmacist phone directory can be found on [email or web address] listed under [place]**

## 2020-09-27 NOTE — Progress Notes (Signed)
125 cc of fentanyl wasted in stericycle with Juan Quam, RN.

## 2020-09-27 NOTE — Transfer of Care (Signed)
Immediate Anesthesia Transfer of Care Note  Patient: [name]  Procedure(s) Performed: RE -EXPLORATORY LAPAROTOMY AND [place] OUT (N/A )  Patient Location: ICU  Anesthesia Type:General  Level of Consciousness: Patient remains intubated per anesthesia plan  Airway & Oxygen Therapy: Patient remains intubated per anesthesia plan and Patient placed on Ventilator (see vital sign flow sheet for setting)  Post-op Assessment: Report given to RN and Post -op Vital signs reviewed and stable  Post vital signs: Reviewed and stable  Last Vitals:  Vitals Value Taken Time  BP    Temp    Pulse    Resp    SpO2      Last Pain:  Vitals:   09/19/2020 0000  TempSrc: Bladder  PainSc:          Complications: No complications documented.

## 2020-09-28 DIAGNOSIS — J9600 Acute respiratory failure, unspecified whether with hypoxia or hypercapnia: Secondary | ICD-10-CM | POA: Diagnosis not present

## 2020-09-28 DIAGNOSIS — D3A8 Other benign neuroendocrine tumors: Secondary | ICD-10-CM | POA: Diagnosis not present

## 2020-09-28 DIAGNOSIS — A419 Sepsis, unspecified organism: Secondary | ICD-10-CM | POA: Diagnosis not present

## 2020-09-28 DIAGNOSIS — Z7189 Other specified counseling: Secondary | ICD-10-CM | POA: Diagnosis not present

## 2020-09-28 LAB — MAGNESIUM
Magnesium: 1.9 mg/dL (ref 1.7–2.4)
Magnesium: 2.3 mg/dL (ref 1.7–2.4)

## 2020-09-28 LAB — CBC
HCT: 29.5 % — ABNORMAL LOW (ref 39.0–52.0)
Hemoglobin: 9.5 g/dL — ABNORMAL LOW (ref 13.0–17.0)
MCH: 29.2 pg (ref 26.0–34.0)
MCHC: 32.2 g/dL (ref 30.0–36.0)
MCV: 90.8 fL (ref 80.0–100.0)
Platelets: 102 10*3/uL — ABNORMAL LOW (ref 150–400)
RBC: 3.25 MIL/uL — ABNORMAL LOW (ref 4.22–5.81)
RDW: 16.4 % — ABNORMAL HIGH (ref 11.5–15.5)
WBC: 16.5 10*3/uL — ABNORMAL HIGH (ref 4.0–10.5)
nRBC: 1.1 % — ABNORMAL HIGH (ref 0.0–0.2)

## 2020-09-28 LAB — COMPREHENSIVE METABOLIC PANEL
ALT: 26 U/L (ref 0–44)
AST: 36 U/L (ref 15–41)
Albumin: 1.6 g/dL — ABNORMAL LOW (ref 3.5–5.0)
Alkaline Phosphatase: 60 U/L (ref 38–126)
Anion gap: 6 (ref 5–15)
BUN: 42 mg/dL — ABNORMAL HIGH (ref 6–20)
CO2: 22 mmol/L (ref 22–32)
Calcium: 7.8 mg/dL — ABNORMAL LOW (ref 8.9–10.3)
Chloride: 110 mmol/L (ref 98–111)
Creatinine, Ser: 2.21 mg/dL — ABNORMAL HIGH (ref 0.61–1.24)
GFR, Estimated: 36 mL/min — ABNORMAL LOW (ref >60)
Glucose, Bld: 193 mg/dL — ABNORMAL HIGH (ref 70–99)
Potassium: 4.1 mmol/L (ref 3.5–5.1)
Sodium: 138 mmol/L (ref 135–145)
Total Bilirubin: 6.4 mg/dL — ABNORMAL HIGH (ref 0.3–1.2)
Total Protein: 4.1 g/dL — ABNORMAL LOW (ref 6.5–8.1)

## 2020-09-28 LAB — GLUCOSE, CAPILLARY
Glucose-Capillary: 165 mg/dL — ABNORMAL HIGH (ref 70–99)
Glucose-Capillary: 165 mg/dL — ABNORMAL HIGH (ref 70–99)
Glucose-Capillary: 158 mg/dL — ABNORMAL HIGH (ref 70–99)
Glucose-Capillary: 182 mg/dL — ABNORMAL HIGH (ref 70–99)
Glucose-Capillary: 173 mg/dL — ABNORMAL HIGH (ref 70–99)
Glucose-Capillary: 157 mg/dL — ABNORMAL HIGH (ref 70–99)

## 2020-09-28 MED ORDER — MIDAZOLAM 50MG/50ML (1MG/ML) PREMIX INFUSION
0.5000 mg/h | INTRAVENOUS | Status: DC
  Administered 2020-09-28 (×2): 5 mg/h via INTRAVENOUS
  Administered 2020-09-29: 4 mg/h via INTRAVENOUS
  Administered 2020-09-29: 10 mg/h via INTRAVENOUS
  Administered 2020-09-29: 5 mg/h via INTRAVENOUS
  Administered 2020-09-30 (×4): 10 mg/h via INTRAVENOUS
  Administered 2020-10-01: 6 mg/h via INTRAVENOUS
  Filled 2020-09-28 (×5): qty 50
  Filled 2020-09-28: qty 100
  Filled 2020-09-28 (×3): qty 50

## 2020-09-28 MED ORDER — MAGNESIUM SULFATE 2 GM/50ML IV SOLN
2.0000 g | Freq: Once | INTRAVENOUS | Status: AC
  Administered 2020-09-28: 2 g via INTRAVENOUS
  Filled 2020-09-28: qty 50

## 2020-09-28 MED ORDER — ZINC CHLORIDE 1 MG/ML IV SOLN
INTRAVENOUS | Status: AC
  Filled 2020-09-28: qty 1137.6

## 2020-09-28 MED ORDER — ALBUMIN HUMAN 5 % IV SOLN
12.5000 g | Freq: Once | INTRAVENOUS | Status: AC
  Administered 2020-09-28: 12.5 g via INTRAVENOUS
  Filled 2020-09-28: qty 250

## 2020-09-28 MED ORDER — MAGNESIUM SULFATE IN D5W 1-5 GM/100ML-% IV SOLN: 1.0000 g | Freq: Once | INTRAVENOUS | Status: DC

## 2020-09-28 MED ORDER — ENOXAPARIN SODIUM 30 MG/0.3ML ~~LOC~~ SOLN
30.0000 mg | Freq: Two times a day (BID) | SUBCUTANEOUS | Status: DC
  Administered 2020-09-28 – 2020-09-30 (×5): 30 mg via SUBCUTANEOUS
  Filled 2020-09-28 (×5): qty 0.3

## 2020-09-28 MED ORDER — POLYVINYL ALCOHOL 1.4 % OP SOLN
1.0000 [drp] | OPHTHALMIC | Status: DC | PRN
  Filled 2020-09-28: qty 15

## 2020-09-28 MED ORDER — LACTATED RINGERS IV BOLUS
1000.0000 mL | Freq: Once | INTRAVENOUS | Status: AC
  Administered 2020-09-28: 1000 mL via INTRAVENOUS

## 2020-09-28 NOTE — Consult Note (Signed)
Palliative Medicine Inpatient Consult Note  Reason for consult:  GOC, end of life  HPI: Per Critical Care Progress Note 3/20 "Brief History:  50 y.o. M with PMH of neuroendocrine tumor and underwent Whipple procedure 3/14 with intra-operative SMV laceration and repair by vascular with bovine pericardial patch.  Pt had worsening shock overnight and required three pressors despite massive transfusion protocol (received 150 blood products).  He was taken back to the OR for washout on 3/15 and returned to the ICU intubated.  Taken back 3/17 again found to have small bowel necrosis s/p resection.  Plans again to return 3/19 to OR.   Past Medical History:   has a past medical history of AICD (automatic cardioverter/defibrillator) present, Dyslipidemia, Heart failure with mildly reduced EF, History of kidney stones, HTN (hypertension), ventricular fibrillation, and Nonischemic cardiomyopathy.   Significant Hospital Events:  3/14 Admit to Surgery, to OR for whipple, SMV injury and repair, massive transfusion protocol overnight  3/15 Back to OR for washout, x2. IR for arteriogram. No major bleed source identified in OR, or arterial bleed w IR. Robust product resuscitation >75 products (+ TXA + novoseven) 3/16 off pressors, add'l 39 products overnight. Slowing resuscitation this morning with hemodynamic improvements and slowing drain output; OR x 2 - washout, biliary t tube, wound vac -- washout, wound vac, IR for arteriogram -- no arterial bleed for embolization 3/17 Aggressive balanced transfusion continue overnight with marked hemodynamic improvement since yesterday evening. Off pressors x several hours, Wound vac output significantly slowing (from 525m q15-345m to 50055m1hr+) and output is much thinner, Thin bloody secretions from mouth approx 250m82mecreased RR from 22 to 18, Unable to tolerate NE- severe bradycardia"  "Interim History / Subjective:   Family informed that the patient has a  non-viable degree of small bowel infarction yesterday. [facility] contacted and concurred that transfer would not be beneficial as would not change outcome.  Increasing vasopressor requirements overnight.  Patient is now febrile."   Clinical Assessment/Goals of Care: I have reviewed medical records including EPIC notes, labs and imaging, received report from bedside RN JennAnderson Maltad assessed the patient.    I met with patient's brother Abass, CousVirginia Rochester an additional cousin to further discuss diagnosis prognosis, GOC, EOL wishes, disposition and options.   I introduced Palliative Medicine as specialized medical care for people living with serious illness. It focuses on providing relief from the symptoms and stress of a serious illness. The goal is to improve quality of life for both the patient and the family.  Mr. [place] is married and has 2 boys ages 8 an32 14 a53 a daughter age 63. 45 has his brother Abass and 2 cousins here in the [place]. [place] rest of his family is in his home country. His family says the wife is not coming to the hospital because she is so distressed. They have been keeping her informed. I had tried to call the wife's phone but her voice mailbox is full and I was unable to leave a message, Eltyib Ilnam [phone]. ElwaBarbato a master's degree. His family speaks very highly of him. The whole family adheres to muslim religion/faith where men in the religion/culture make the decisions.   A detailed discussion was had today regarding advanced directives.  Concepts specific to code status,and aggressive treatment was had.  The difference between a aggressive medical intervention path  and a palliative comfort care path for this patient at this time was  had. Values and goals of care important to patient and family were attempted to be elicited. I tried to address what was important at end of life for them.   His family believes he was quite well  prior to admission. Culturally he may not have shared with him how sick he was prior to admission and surgery. The family members present were able to relay to me previous conversations with the doctors and understand how ill Mr. Lang is. His family spoke of being rewarded in the next life for every pain experienced here on earth so they are not concerned with the current trajectory being an uncomfortable or painful dying process.  They asked that we "not use paddles" as it could cause harm. I clarified that they  did not want electrical shocks. They still want chest compressions and continued ventilation in the event of cardiac arrest. They state they discussed this with the [facility] and previously with the patient. They believe that God can heal and they have hope that Mr. Arons can get better. I reiterated how ill he currently is with no options for treatment of the unsalveagable bowel necrosi and that he will die from overwhelming infection.   We discussed the importance of continued conversation with family and their  medical providers regarding overall plan of care and treatment options, ensuring decisions are within the context of the patients values and GOCs. The family has  our contact information. We will continue to follow along with the other providers in the care of this patient.   Provided  "Hard Choices for [employer]" booklet.   Provided "Gone From My Site" booklet.  Decision Maker: Brother Abass as next male kin present.   SUMMARY OF RECOMMENDATIONS    Code Status/Advance Care Planning:  Modified CODE: no defibrillation   Symptom Management:  Agitation: midazolam drip for sedation Pain expected: Hydromorphone drip   Palliative Prophylaxis:   Fever- acetaminophen IV  Artifical tears for dry eyes  Psycho-social/Spiritual:   Desire for further Chaplaincy support: Family was told that their [facility] leader could visit. This may help provide them with more guidance in  decisions.   Additional Recommendations: Continue culturally appropriate support to the family.   Prognosis: Poor, unsalvageable bowel necrosis with no rescue options. Expected with lactic acid increasing to see increasing sepsis over time.  Discharge Planning: Anticipate death will occur in hospital.    Vitals with BMI 09/28/2020 09/28/2020 09/28/2020  Height - - -  Weight - - -  BMI - - -  Systolic 272 - -  Diastolic 69 - -  Pulse 81 84 83     PPS: 10%   This conversation/these recommendations were discussed with patient primary care team, Dr. Bobbye Morton and nursing staff.  Thank you for the opportunity to participate in the care of this patient and family.   Time In:1:00 Time Out:3:50 Total Time: >70 minutes Greater than 50%  of this time was spent counseling and coordinating care related to the above assessment and plan.  Lindell Spar, NP [facility] Palliative Medicine Team Team Cell Phone: [phone] Please utilize secure chat with additional questions, if there is no response within 30 minutes please call the above phone number  Palliative Medicine Team providers are available by phone from 7am to 7pm daily and can be reached through the team cell phone.  Should this patient require assistance outside of these hours, please call the patient's attending physician.

## 2020-09-28 NOTE — Progress Notes (Signed)
[name], MRN:  [id number], DOB:  16-Dec-1970, LOS: 6 ADMISSION DATE:  09/16/2020, CONSULTATION DATE:  09/28/20 REFERRING MD:  Anesthesia, CHIEF COMPLAINT:  Whipple, post-op shock   Brief History:  50 y.o. M with PMH of neuroendocrine tumor and underwent Whipple procedure 3/14 with intra-operative SMV laceration and repair by vascular with bovine pericardial patch.  Pt had worsening shock overnight and required three pressors despite massive transfusion protocol (received 150 blood products).  He was taken back to the OR for washout on 3/15 and returned to the ICU intubated.  Taken back 3/17 again found to have small bowel necrosis s/p resection.  Plans again to return 3/19 to OR.   Past Medical History:   has a past medical history of AICD (automatic cardioverter/defibrillator) present, Dyslipidemia, Heart failure with mildly reduced EF, History of kidney stones, HTN (hypertension), ventricular fibrillation, and Nonischemic cardiomyopathy.   Significant Hospital Events:  3/14 Admit to Surgery, to OR for whipple, SMV injury and repair, massive transfusion protocol overnight  3/15 Back to OR for washout, x2. IR for arteriogram. No major bleed source identified in OR, or arterial bleed w IR. Robust product resuscitation >75 products (+ TXA + novoseven) 3/16 off pressors, add'l 39 products overnight. Slowing resuscitation this morning with hemodynamic improvements and slowing drain output; OR x 2 - washout, biliary t tube, wound vac -- washout, wound vac, IR for arteriogram -- no arterial bleed for embolization 3/17 Aggressive balanced transfusion continue overnight with marked hemodynamic improvement since yesterday evening. Off pressors x several hours, Wound vac output significantly slowing (from 540ml q15-50min to 541ml q1hr+) and output is much thinner, Thin bloody secretions from mouth approx 210ml, Decreased RR from 22 to 18, Unable to tolerate NE- severe bradycardia  Consults:   PCCM VVS  IR   Procedures:  3/15 ETT 3/15 R IJ cordis/ CVL>> 3/15 left aline >>  Significant Diagnostic Tests:  3/15 mesenteric arteriogram> no identified arterial source of bleed 3/16 OR wash out, wound vac, IR for ateriogram 3/17 Remains on Epi 6 mcg/min and vasopressin, following some commands, back to OR found to have small bowel necrosis s/p resection, sCr 2.7-> 3.1, Wound vac 1.7L/ 24hr, Hgb stable 11.5-> 11.7  Micro Data:  3/15 MRSA>>negative  Antimicrobials:  Ancef 3/14 Flagyl 3/14 3/17 flagyl >> 3/17 ceftriaxone >> 3/18 vanc >>  Interim History / Subjective:   Family informed that the patient has a non-viable degree of small bowel infarction yesterday. [facility] contacted and concurred that transfer would not be beneficial as would not change outcome.  Increasing vasopressor requirements overnight.  Patient is now febrile.  Objective   Blood pressure 111/71, pulse 92, temperature 100.22 F (37.9 C), resp. rate 18, height 6\' 2"  (1.88 m), weight 95.3 kg, SpO2 99 %.    Vent Mode: PRVC FiO2 (%):  [30 %] 30 % Set Rate:  [18 bmp] 18 bmp Vt Set:  [580 mL] 580 mL PEEP:  [5 cmH20] 5 cmH20 Plateau Pressure:  [22 cmH20-27 cmH20] 26 cmH20   Intake/Output Summary (Last 24 hours) at 09/28/2020 1033 Last data filed at 09/28/2020 0800 Gross per 24 hour  Intake 4224.35 ml  Output 2250 ml  Net 1974.35 ml   Filed Weights   09/20/2020 0639  Weight: 95.3 kg    Examination: General:  Critically ill adult male in NAD HEENT: MM pink/moist, ETT/ OGT - dark bloody drainage, pupils 3/reactive, scleral icterus present Neuro: sedated but will attempt to open eyes, f/c commands- wiggles toes  and gives thumbs up, nods approprietly to questions, appears distressed. CV: rr, no murmur PULM:  MV supported breaths, thick tan secretions this morning, now cleared, CTA, no wheeze GI: less distended, abdomen now closed with overlying dry dressing. GU: Foley catheter in place with good urine  output. Extremities: warm/dry, overall appears to have mildly improved anasarca  Skin: no rashes   Resolved Hospital Problem list     Assessment & Plan:   Critically ill due to acute respiratory failure with hypoxia, requiring mechanical ventilaion Status post Whipple for neuroendocrine tumor Hemorrhagic shock-now resolved Critically ill due to distributive shock due small bowel infarction.  Compensated nonischemic cardiomyopathy with ICD.   AKI  Neuroendocrine tumor s/p whipple and r hemicolectomy, smv repair (3/14); s/p washout x 2 with biliary t tube placement, wound vac application 9/62   Plan:  - Continue current hemodynamic support pending family decision regarding transition to comfort care.  Palliative care consulted today. -Continue to titrate epinephrine.  Patient is currently receiving in excess of 200 mL an hour of obligate fluids, unlikely to be volume responsive but try fluid challenge. -Will likely become increasingly septic over time as small bowel completely necroses. -Will switch to midazolam to provide deeper level of sedation.   Daily Goals Checklist  Pain/Anxiety/Delirium protocol (if indicated): Versed and fentanyl infusions VAP protocol (if indicated): bundle in place.  Respiratory support goals: Full ventilatory support Blood pressure target: Titrate epinephrine to keep MAP 65. DVT prophylaxis: SCDs Nutrition Status: On TPN GI prophylaxis: Pantoprazole Fluid status goals: Allow autoregulation Urinary catheter: Assessment of intravascular volume, end-of-life care. Central lines: Right IJ central line still needed Glucose control: Adequate control on sliding scale insulin Mobility/therapy needs: Bedrest Antibiotic de-escalation: Continue ceftriaxone and vancomycin Restraints: Restraint type: Soft restraint:  bilateral wrist Reason for restraints: Interference with medical treatment Home medication reconciliation: On hold Daily labs: CBC, CMP  daily Code Status: Full code Family Communication: Updated by Dr. Zenia Resides.  Goals of care discussion Disposition: ICU.   Goals of Care:  Last date of multidisciplinary goals of care discussion: 3/19 Family and staff present: Dr. Lynetta Mare, Ricky Ala and multiple family members Summary of discussion: Family informed that patient has unsalvageable bowel necrosis with no rescue options.  Expectation is the patient will become increasingly septic over time.  Unable to resect bowel because insufficient bowel to create anastomoses.  Family requested second opinion.  Situation discussed with hepatobiliary service at [facility] who concurs. Follow up goals of care discussion due: Palliative care consultation.  Ongoing conversations regarding transition to comfort care Code Status:  Full Code.   Labs   CBC: Recent Labs  Lab 09/24/20 1650 09/21/2020 0427 09/09/2020 0910 09/26/20 0250 09/26/20 0522 09/14/2020 0721 09/20/2020 0814 09/28/20 0533  WBC 3.9* 6.4  --   --  8.7 12.4*  --  16.5*  NEUTROABS  --  5.2  --   --   --   --   --   --   HGB 11.5* 11.7*   < > 9.5* 10.8* 10.3* 9.2* 9.5*  HCT 32.1* 32.6*   < > 28.0* 31.0* 30.3* 27.0* 29.5*  MCV 83.6 84.7  --   --  86.8 87.1  --  90.8  PLT 69*  68* 96*  --   --  85* 82*  --  102*   < > = values in this interval not displayed.    Basic Metabolic Panel: Recent Labs  Lab 09/24/20 1219 09/24/20 1650 09/10/2020 0427 09/24/2020 0910 09/26/20  2694 09/26/20 0522 09/26/20 1541 10/09/2020 0553 09/19/2020 0814 09/24/2020 1700 09/28/20 0533  NA 146*  --  145   < > 145 143  --  144 145  --  138  K 3.7  --  3.2*   < > 4.2 4.0  --  4.5 4.0  --  4.1  CL 106  --  106  --   --  108  --  109  --   --  110  CO2 27  --  25  --   --  26  --  23  --   --  22  GLUCOSE 89  --  160*  --   --  157*  --  128*  --   --  193*  BUN 25*  --  34*  --   --  34*  --  32*  --   --  42*  CREATININE 2.70*  --  3.10*  --   --  2.39*  --  2.05*  --   --  2.21*  CALCIUM 8.2*   --  7.7*  --   --  7.8*  --  7.8*  --   --  7.8*  MG  --    < > 2.3   < >  --  2.1 1.9 2.0  --  1.8 1.9  PHOS  --   --  6.4*  --   --  5.0*  --  3.6  --   --   --    < > = values in this interval not displayed.   GFR: Estimated Creatinine Clearance: 47 mL/min (A) (by C-G formula based on SCr of 2.21 mg/dL (H)). Recent Labs  Lab 10/03/2020 0427 09/13/2020 0955 09/26/20 0522 09/26/20 1208 09/26/20 1541 09/26/20 1941 09/26/20 2341 10/06/2020 0651 10/01/2020 0721 09/28/20 0533  WBC 6.4  --  8.7  --   --   --   --   --  12.4* 16.5*  LATICACIDVEN  --    < >  --    < > 1.6 1.7 1.6 4.3*  --   --    < > = values in this interval not displayed.    Liver Function Tests: Recent Labs  Lab 09/24/20 1219 09/14/2020 0427 09/26/20 0522 10/04/2020 0553 09/28/20 0533  AST 290* 205* 75* 49* 36  ALT 96* 134* 84* 48* 26  ALKPHOS 46 57 58 60 60  BILITOT 2.7* 3.7* 4.3* 5.3* 6.4*  PROT 4.6* 4.7* 4.2* 4.3* 4.1*  ALBUMIN 2.5* 2.3* 1.8* 1.7* 1.6*   No results for input(s): LIPASE, AMYLASE in the last 168 hours. No results for input(s): AMMONIA in the last 168 hours.  ABG    Component Value Date/Time   PHART 7.355 09/16/2020 0814   PCO2ART 45.2 09/18/2020 0814   PO2ART 204 (H) 09/13/2020 0814   HCO3 25.0 09/26/2020 0814   TCO2 26 09/23/2020 0814   ACIDBASEDEF 2.0 09/19/2020 1953   O2SAT 100.0 09/30/2020 0814     Coagulation Profile: Recent Labs  Lab 09/12/2020 1228 09/11/2020 1947 09/24/20 0400 09/24/20 1650 09/11/2020 1909  INR 1.5* 0.9 1.2 1.4* 1.4*    Cardiac Enzymes: No results for input(s): CKTOTAL, CKMB, CKMBINDEX, TROPONINI in the last 168 hours.  HbA1C: Hgb A1c MFr Bld  Date/Time Value Ref Range Status  09/10/2020 04:13 AM 5.2 4.8 - 5.6 % Final    Comment:    (NOTE) Pre diabetes:          5.7%-6.4%  Diabetes:              >6.4%  Glycemic control for   <7.0% adults with diabetes   11/16/2018 04:41 PM 5.6 4.8 - 5.6 % Final    Comment:             Prediabetes: 5.7 - 6.4           Diabetes: >6.4          Glycemic control for adults with diabetes: <7.0     CBG: Recent Labs  Lab 09/26/2020 1609 10/06/2020 1932 09/30/2020 2308 09/28/20 0318 09/28/20 0830  GLUCAP 108* 133* 138* 165* 165*    CRITICAL CARE Performed by: Kipp Brood   Total critical care time: 40 minutes  Critical care time was exclusive of separately billable procedures and treating other patients.  Critical care was necessary to treat or prevent imminent or life-threatening deterioration.  Critical care was time spent personally by me on the following activities: development of treatment plan with patient and/or surrogate as well as nursing, discussions with consultants, evaluation of patient's response to treatment, examination of patient, obtaining history from patient or surrogate, ordering and performing treatments and interventions, ordering and review of laboratory studies, ordering and review of radiographic studies, pulse oximetry, re-evaluation of patient's condition and participation in multidisciplinary rounds.  Kipp Brood, MD [facility] ICU Physician [place]  Pager: [phone] Mobile: [phone] After hours: [phone].   09/28/2020, 10:33 AM

## 2020-09-28 NOTE — Progress Notes (Signed)
Trauma/Critical Care Follow Up Note  Subjective:    Overnight Issues:   Objective:  Vital signs for last 24 hours: Temp:  [98.24 F (36.8 C)-102.92 F (39.4 C)] 100.22 F (37.9 C) (03/20 0800) Pulse Rate:  [67-128] 93 (03/20 0800) Resp:  [18-24] 23 (03/20 0800) BP: (96-126)/(52-85) 107/69 (03/20 0800) SpO2:  [94 %-100 %] 99 % (03/20 0800) Arterial Line BP: (94-168)/(38-82) 113/57 (03/20 0800) FiO2 (%):  [30 %] 30 % (03/20 0800)  Hemodynamic parameters for last 24 hours:    Intake/Output from previous day: 03/19 0701 - 03/20 0700 In: 5412.7 [I.V.:3612.7; IV Piggyback:1800.1] Out: 3490 [Urine:1440; Drains:1050; Blood:100]  Intake/Output this shift: Total I/O In: 161.6 [I.V.:161.6] Out: -   Vent settings for last 24 hours: Vent Mode: PRVC FiO2 (%):  [30 %] 30 % Set Rate:  [18 bmp] 18 bmp Vt Set:  [580 mL] 580 mL PEEP:  [5 cmH20] 5 cmH20 Plateau Pressure:  [22 cmH20-27 cmH20] 26 cmH20  Physical Exam:  Gen: comfortable, no distress HEENT: PERRL Neck: supple CV: RRR, on 12 of epi Pulm: unlabored breathing on MV Abd: soft, distended, abthera in place GU: clear yellow urine, marginal but adequate o/n Extr: wwp, diffusely edematous-stable   Results for orders placed or performed during the hospital encounter of 10/05/2020 (from the past 24 hour(s))  Glucose, capillary     Status: Abnormal   Collection Time: 09/17/2020 11:58 AM  Result Value Ref Range   Glucose-Capillary 124 (H) 70 - 99 mg/dL  Vancomycin, random     Status: None   Collection Time: 10/04/2020 12:00 PM  Result Value Ref Range   Vancomycin Rm 9   Glucose, capillary     Status: Abnormal   Collection Time: 09/12/2020  4:09 PM  Result Value Ref Range   Glucose-Capillary 108 (H) 70 - 99 mg/dL  Magnesium     Status: None   Collection Time: 09/10/2020  5:00 PM  Result Value Ref Range   Magnesium 1.8 1.7 - 2.4 mg/dL  APTT     Status: Abnormal   Collection Time: 09/28/2020  7:09 PM  Result Value Ref Range    aPTT 39 (H) 24 - 36 seconds  Protime-INR     Status: Abnormal   Collection Time: 09/26/2020  7:09 PM  Result Value Ref Range   Prothrombin Time 16.9 (H) 11.4 - 15.2 seconds   INR 1.4 (H) 0.8 - 1.2  Fibrinogen     Status: Abnormal   Collection Time: 09/17/2020  7:09 PM  Result Value Ref Range   Fibrinogen 784 (H) 210 - 475 mg/dL  Glucose, capillary     Status: Abnormal   Collection Time: 10/03/2020  7:32 PM  Result Value Ref Range   Glucose-Capillary 133 (H) 70 - 99 mg/dL  Glucose, capillary     Status: Abnormal   Collection Time: 10/04/2020 11:08 PM  Result Value Ref Range   Glucose-Capillary 138 (H) 70 - 99 mg/dL  Glucose, capillary     Status: Abnormal   Collection Time: 09/28/20  3:18 AM  Result Value Ref Range   Glucose-Capillary 165 (H) 70 - 99 mg/dL  CBC     Status: Abnormal   Collection Time: 09/28/20  5:33 AM  Result Value Ref Range   WBC 16.5 (H) 4.0 - 10.5 K/uL   RBC 3.25 (L) 4.22 - 5.81 MIL/uL   Hemoglobin 9.5 (L) 13.0 - 17.0 g/dL   HCT 29.5 (L) 39.0 - 52.0 %   MCV 90.8 80.0 - 100.0  fL   MCH 29.2 26.0 - 34.0 pg   MCHC 32.2 30.0 - 36.0 g/dL   RDW 16.4 (H) 11.5 - 15.5 %   Platelets 102 (L) 150 - 400 K/uL   nRBC 1.1 (H) 0.0 - 0.2 %  Comprehensive metabolic panel     Status: Abnormal   Collection Time: 09/28/20  5:33 AM  Result Value Ref Range   Sodium 138 135 - 145 mmol/L   Potassium 4.1 3.5 - 5.1 mmol/L   Chloride 110 98 - 111 mmol/L   CO2 22 22 - 32 mmol/L   Glucose, Bld 193 (H) 70 - 99 mg/dL   BUN 42 (H) 6 - 20 mg/dL   Creatinine, Ser 2.21 (H) 0.61 - 1.24 mg/dL   Calcium 7.8 (L) 8.9 - 10.3 mg/dL   Total Protein 4.1 (L) 6.5 - 8.1 g/dL   Albumin 1.6 (L) 3.5 - 5.0 g/dL   AST 36 15 - 41 U/L   ALT 26 0 - 44 U/L   Alkaline Phosphatase 60 38 - 126 U/L   Total Bilirubin 6.4 (H) 0.3 - 1.2 mg/dL   GFR, Estimated 36 (L) >60 mL/min   Anion gap 6 5 - 15  Magnesium     Status: None   Collection Time: 09/28/20  5:33 AM  Result Value Ref Range   Magnesium 1.9 1.7 - 2.4  mg/dL    Assessment & Plan: The plan of care was discussed with the bedside nurse for the night, who is in agreement with this plan and no additional concerns were raised.   Present on Admission: . Neuroendocrine neoplasm of gastrointestinal tract . Calculus of gallbladder with acute cholecystitis . Pancreas cancer ([place]) . Neuroendocrine tumor    LOS: 6 days   Additional comments:I reviewed the patient's new clinical lab test results.   and I reviewed the patients new imaging test results.    50 yo male with a neuroendocrine tumor of the head of the pancreas, POD6 s/p right hemicolectomy, end ileostomy, Whipple and patch angioplasty repair of SMV. Complicated by postoperative hemorrhagic shock with DIC with extensive small bowel necrosis. Multiple takebacks to OR with most recent on 3/19 notable for near complete small bowel necrosis and insufficient small bowel to be compatible with life. Multiple lengthy family discussions held yesterday and second opinion obtained from [facility] with no further surgical options recommended or available.  - Continue TPN and IV fluid hydration - Wean epi as tolerated - Appreciate CCM assistance with vent management - Continue antibiotics - Replete magnesium - VTE: start chemical DVT ppx  - Palliative care consult today - Dispo: ICU  [name], MD Trauma & General Surgery Please use [email or web address] to contact on call provider  09/28/2020  *Care during the described time interval was provided by me. I have reviewed this patient's available data, including medical history, events of note, physical examination and test results as part of my evaluation.

## 2020-09-28 NOTE — Progress Notes (Signed)
Following the family's request to obtain a second opinion from another institution, I spoke with a surgical oncologist at [facility] via phone to discuss this patient. I discussed the operation and the patient's postoperative course, and relayed that the patient had extensive small bowel necrosis and has essentially no viable remaining small bowel. He felt that [facility] did not have any additional services that could help this patient that we are not already providing. We both agreed that transferring this patient to [facility] is probably not in the patient's best interest given his hemodynamic instability. We will continue caring for the patient here.

## 2020-09-28 NOTE — Progress Notes (Signed)
Midline abdominal incision leaking serous fluid at the bottom of incision. MD aware, no new orders.

## 2020-09-28 NOTE — Progress Notes (Signed)
Spoke with family member at bedside at length regarding patient's poor prognosis. Educated on pressors and interventions being used to prolong life. Family member continues to ask if the patient could go home "with a hired nurse" on these medications and survive. I contacted Dr. Zenia Resides, who agreed that I should page Palliative to come speak with family. Palliative has been contacted.

## 2020-09-28 NOTE — Progress Notes (Signed)
PHARMACY - TOTAL PARENTERAL NUTRITION CONSULT NOTE  Indication:  Expected prolonged ileus  Patient Measurements: Height: 6\' 2"  (188 cm) Weight: 95.3 kg (210 lb) IBW/kg (Calculated) : 82.2 TPN AdjBW (KG): 95.3 Body mass index is 26.96 kg/m.  Assessment:  81 YOM with history of mass adjacent to head of the pancreas in 2021.  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  Developed cholecystitis in Feb 2022 and had percutaneous cholecystectomy tube placement.  Presented this admission for neuroendocrine tumor resection with Whipple procedure, also had right total colectomy, colostomy and cholecystectomy on 09/26/2020.  SMV was lacerated during procedure and repaired by bovine pericardial patch.  Developed hemorrhagic shock with bile leak post-op and underwent placement of biliary T tube with abdominal washout on 3/15 PM.  Abdomen remains open.  Pharmacy consulted to manage TPN.  Glucose / Insulin: no hx DM, A1c 5.2% - hypoglycemic on 3/16 PM. CBGs <180, 9 units SSI used in last 24h Electrolytes: Na 138, K 4.1 (goal >/= 4), Phos 3.6, CoCa WNL (iCa low at 1.05), Mag 1.9 (goal >/= 2) Renal: AKI, SCr up 2.21 (BL ~1-1.2), BUN up 42 Hepatic: AST/ALT down WNL, tbili up to 6.4, TG elevated at 283 (down trend, off propofol).  Albumin 2.3>>1.6, BL prealbumin 11.8 Intake / Output; MIVF: UOP 0.9>0.6 ml/kg/hr, drain 1061ml, other OP 927ml, no stool OP, EBL ~12L on 3/15-3/16 + 700 ml, (concentrate TPN per CCM) GI Imaging: none since TPN start GI Surgeries / Procedures:  3/14 Whipple procedure with en-bloc right hemicolectomy and end ileostomy, cholecystectomy 3/15 OR for washout, VAC placement 3/17 OR for washout, VAC replacement.  Unlikely to close abdomen d/t edema - necrosis of the entire small bowel except for the PB limb and proximal 40-50cm of jejunum 3/19 OR for re-opening laparotomy, abdominal closure, found to have necrotic SB and deemed not survivable condition  Central  access: CVC double lumen placed 09/26/2020 TPN start date: 09/26/19  Nutritional Goals (RD rec on 3/18): 2500-2900 kCal and 165-190g AA per day, fluid > 2L/day Goal TPN is 90 ml/hr with 24g/L ILE (ILE 20% of total kCal) = 170g AA, 400g CHO and 52g ILE for a total of 2559 kCal per day  Current Nutrition:  NPO  Plan:  Continue concentrated TPN at goal rate of 90 ml/hr. This TPN provides 170 g of protein, 400 g of dextrose for a total of 2041 kcals meeting 100% protein need, 81% of kcal needs Continue with no lipid in TPN today due to previously elevated TG. Propofol remains off. Consider adding in tomorrow if TG improved. Electrolytes in TPN: incr Na 25 mEq/L, decr K 50mEq/L, Ca 3mEq/L, incr Mag 31mEq/L, no Phos, max acetate  Add MVI, remove trace elements and add back zinc, chromium, selenium (scleral icterus noted) Mag 2 g IV x1 Continue moderate SSI Q4H and adjust as needed F/U TPN labs Mon/Thurs Palliative Care consult today  Thank you for involving pharmacy in this patient's care.  Renold Genta, PharmD, BCPS Clinical Pharmacist Clinical phone for 09/28/2020 until 3p is [phone] 09/28/2020 7:12 AM  **Pharmacist phone directory can be found on [email or web address] listed under [place]**

## 2020-09-29 ENCOUNTER — Encounter (HOSPITAL_COMMUNITY): Payer: Self-pay | Admitting: Surgery

## 2020-09-29 ENCOUNTER — Encounter (HOSPITAL_COMMUNITY): Admission: RE | Disposition: E | Payer: Self-pay | Source: Home / Self Care | Attending: Surgery

## 2020-09-29 ENCOUNTER — Inpatient Hospital Stay (HOSPITAL_COMMUNITY): Payer: Managed Care, Other (non HMO) | Admitting: Anesthesiology

## 2020-09-29 DIAGNOSIS — R579 Shock, unspecified: Secondary | ICD-10-CM | POA: Diagnosis not present

## 2020-09-29 DIAGNOSIS — D3A8 Other benign neuroendocrine tumors: Principal | ICD-10-CM

## 2020-09-29 DIAGNOSIS — J9601 Acute respiratory failure with hypoxia: Secondary | ICD-10-CM | POA: Diagnosis not present

## 2020-09-29 DIAGNOSIS — K559 Vascular disorder of intestine, unspecified: Secondary | ICD-10-CM

## 2020-09-29 HISTORY — PX: BOWEL RESECTION: SHX1257

## 2020-09-29 HISTORY — PX: GASTROSTOMY: SHX5249

## 2020-09-29 HISTORY — PX: LAPAROTOMY: SHX154

## 2020-09-29 LAB — GLUCOSE, CAPILLARY
Glucose-Capillary: 140 mg/dL — ABNORMAL HIGH (ref 70–99)
Glucose-Capillary: 154 mg/dL — ABNORMAL HIGH (ref 70–99)
Glucose-Capillary: 156 mg/dL — ABNORMAL HIGH (ref 70–99)
Glucose-Capillary: 171 mg/dL — ABNORMAL HIGH (ref 70–99)
Glucose-Capillary: 204 mg/dL — ABNORMAL HIGH (ref 70–99)
Glucose-Capillary: 81 mg/dL (ref 70–99)

## 2020-09-29 LAB — DIFFERENTIAL
Abs Immature Granulocytes: 2.25 10*3/uL — ABNORMAL HIGH (ref 0.00–0.07)
Basophils Absolute: 0.1 10*3/uL (ref 0.0–0.1)
Basophils Relative: 0 %
Eosinophils Absolute: 0.9 10*3/uL — ABNORMAL HIGH (ref 0.0–0.5)
Eosinophils Relative: 4 %
Immature Granulocytes: 9 %
Lymphocytes Relative: 6 %
Lymphs Abs: 1.5 10*3/uL (ref 0.7–4.0)
Monocytes Absolute: 1.9 10*3/uL — ABNORMAL HIGH (ref 0.1–1.0)
Monocytes Relative: 8 %
Neutro Abs: 17.9 10*3/uL — ABNORMAL HIGH (ref 1.7–7.7)
Neutrophils Relative %: 73 %
WBC Morphology: INCREASED

## 2020-09-29 LAB — POCT I-STAT 7, (LYTES, BLD GAS, ICA,H+H)
Acid-base deficit: 5 mmol/L — ABNORMAL HIGH (ref 0.0–2.0)
Acid-base deficit: 7 mmol/L — ABNORMAL HIGH (ref 0.0–2.0)
Acid-base deficit: 7 mmol/L — ABNORMAL HIGH (ref 0.0–2.0)
Bicarbonate: 18.8 mmol/L — ABNORMAL LOW (ref 20.0–28.0)
Bicarbonate: 20.2 mmol/L (ref 20.0–28.0)
Bicarbonate: 21.7 mmol/L (ref 20.0–28.0)
Calcium, Ion: 1.09 mmol/L — ABNORMAL LOW (ref 1.15–1.40)
Calcium, Ion: 1.1 mmol/L — ABNORMAL LOW (ref 1.15–1.40)
Calcium, Ion: 1.16 mmol/L (ref 1.15–1.40)
HCT: 26 % — ABNORMAL LOW (ref 39.0–52.0)
HCT: 26 % — ABNORMAL LOW (ref 39.0–52.0)
HCT: 30 % — ABNORMAL LOW (ref 39.0–52.0)
Hemoglobin: 10.2 g/dL — ABNORMAL LOW (ref 13.0–17.0)
Hemoglobin: 8.8 g/dL — ABNORMAL LOW (ref 13.0–17.0)
Hemoglobin: 8.8 g/dL — ABNORMAL LOW (ref 13.0–17.0)
O2 Saturation: 83 %
O2 Saturation: 90 %
O2 Saturation: 99 %
Patient temperature: 37.7
Patient temperature: 38.5
Potassium: 4.2 mmol/L (ref 3.5–5.1)
Potassium: 4.4 mmol/L (ref 3.5–5.1)
Potassium: 4.7 mmol/L (ref 3.5–5.1)
Sodium: 140 mmol/L (ref 135–145)
Sodium: 141 mmol/L (ref 135–145)
Sodium: 142 mmol/L (ref 135–145)
TCO2: 20 mmol/L — ABNORMAL LOW (ref 22–32)
TCO2: 21 mmol/L — ABNORMAL LOW (ref 22–32)
TCO2: 23 mmol/L (ref 22–32)
pCO2 arterial: 38.1 mmHg (ref 32.0–48.0)
pCO2 arterial: 39.9 mmHg (ref 32.0–48.0)
pCO2 arterial: 62 mmHg — ABNORMAL HIGH (ref 32.0–48.0)
pH, Arterial: 7.156 — CL (ref 7.350–7.450)
pH, Arterial: 7.289 — ABNORMAL LOW (ref 7.350–7.450)
pH, Arterial: 7.332 — ABNORMAL LOW (ref 7.350–7.450)
pO2, Arterial: 191 mmHg — ABNORMAL HIGH (ref 83.0–108.0)
pO2, Arterial: 50 mmHg — ABNORMAL LOW (ref 83.0–108.0)
pO2, Arterial: 71 mmHg — ABNORMAL LOW (ref 83.0–108.0)

## 2020-09-29 LAB — CBC
HCT: 29.2 % — ABNORMAL LOW (ref 39.0–52.0)
HCT: 33.4 % — ABNORMAL LOW (ref 39.0–52.0)
HCT: 29.4 % — ABNORMAL LOW (ref 39.0–52.0)
Hemoglobin: 9 g/dL — ABNORMAL LOW (ref 13.0–17.0)
Hemoglobin: 10.5 g/dL — ABNORMAL LOW (ref 13.0–17.0)
Hemoglobin: 9.7 g/dL — ABNORMAL LOW (ref 13.0–17.0)
MCH: 28.8 pg (ref 26.0–34.0)
MCH: 28.5 pg (ref 26.0–34.0)
MCH: 29.2 pg (ref 26.0–34.0)
MCHC: 30.8 g/dL (ref 30.0–36.0)
MCHC: 31.4 g/dL (ref 30.0–36.0)
MCHC: 33 g/dL (ref 30.0–36.0)
MCV: 93.6 fL (ref 80.0–100.0)
MCV: 90.5 fL (ref 80.0–100.0)
MCV: 88.6 fL (ref 80.0–100.0)
Platelets: 99 10*3/uL — ABNORMAL LOW (ref 150–400)
Platelets: 103 10*3/uL — ABNORMAL LOW (ref 150–400)
Platelets: 97 10*3/uL — ABNORMAL LOW (ref 150–400)
RBC: 3.12 MIL/uL — ABNORMAL LOW (ref 4.22–5.81)
RBC: 3.69 MIL/uL — ABNORMAL LOW (ref 4.22–5.81)
RBC: 3.32 MIL/uL — ABNORMAL LOW (ref 4.22–5.81)
RDW: 16.9 % — ABNORMAL HIGH (ref 11.5–15.5)
RDW: 18.9 % — ABNORMAL HIGH (ref 11.5–15.5)
RDW: 19.5 % — ABNORMAL HIGH (ref 11.5–15.5)
WBC: 24.5 10*3/uL — ABNORMAL HIGH (ref 4.0–10.5)
WBC: 31.6 10*3/uL — ABNORMAL HIGH (ref 4.0–10.5)
WBC: 32.5 10*3/uL — ABNORMAL HIGH (ref 4.0–10.5)
nRBC: 1.2 % — ABNORMAL HIGH (ref 0.0–0.2)
nRBC: 1 % — ABNORMAL HIGH (ref 0.0–0.2)
nRBC: 0.9 % — ABNORMAL HIGH (ref 0.0–0.2)

## 2020-09-29 LAB — TRIGLYCERIDES: Triglycerides: 198 mg/dL — ABNORMAL HIGH (ref <150)

## 2020-09-29 LAB — PROTIME-INR
INR: 1.4 — ABNORMAL HIGH (ref 0.8–1.2)
Prothrombin Time: 16.1 seconds — ABNORMAL HIGH (ref 11.4–15.2)

## 2020-09-29 LAB — COMPREHENSIVE METABOLIC PANEL
ALT: 20 U/L (ref 0–44)
ALT: 21 U/L (ref 0–44)
AST: 35 U/L (ref 15–41)
AST: 50 U/L — ABNORMAL HIGH (ref 15–41)
Albumin: 1.4 g/dL — ABNORMAL LOW (ref 3.5–5.0)
Albumin: 1.6 g/dL — ABNORMAL LOW (ref 3.5–5.0)
Alkaline Phosphatase: 67 U/L (ref 38–126)
Alkaline Phosphatase: 78 U/L (ref 38–126)
Anion gap: 7 (ref 5–15)
Anion gap: 11 (ref 5–15)
BUN: 56 mg/dL — ABNORMAL HIGH (ref 6–20)
BUN: 60 mg/dL — ABNORMAL HIGH (ref 6–20)
CO2: 22 mmol/L (ref 22–32)
CO2: 20 mmol/L — ABNORMAL LOW (ref 22–32)
Calcium: 8 mg/dL — ABNORMAL LOW (ref 8.9–10.3)
Calcium: 8 mg/dL — ABNORMAL LOW (ref 8.9–10.3)
Chloride: 111 mmol/L (ref 98–111)
Chloride: 109 mmol/L (ref 98–111)
Creatinine, Ser: 2.93 mg/dL — ABNORMAL HIGH (ref 0.61–1.24)
Creatinine, Ser: 3 mg/dL — ABNORMAL HIGH (ref 0.61–1.24)
GFR, Estimated: 25 mL/min — ABNORMAL LOW (ref >60)
GFR, Estimated: 25 mL/min — ABNORMAL LOW (ref >60)
Glucose, Bld: 179 mg/dL — ABNORMAL HIGH (ref 70–99)
Glucose, Bld: 103 mg/dL — ABNORMAL HIGH (ref 70–99)
Potassium: 4 mmol/L (ref 3.5–5.1)
Potassium: 4.5 mmol/L (ref 3.5–5.1)
Sodium: 140 mmol/L (ref 135–145)
Sodium: 140 mmol/L (ref 135–145)
Total Bilirubin: 4.9 mg/dL — ABNORMAL HIGH (ref 0.3–1.2)
Total Bilirubin: 6.8 mg/dL — ABNORMAL HIGH (ref 0.3–1.2)
Total Protein: 4 g/dL — ABNORMAL LOW (ref 6.5–8.1)
Total Protein: 4.3 g/dL — ABNORMAL LOW (ref 6.5–8.1)

## 2020-09-29 LAB — MAGNESIUM
Magnesium: 2.3 mg/dL (ref 1.7–2.4)
Magnesium: 2.2 mg/dL (ref 1.7–2.4)

## 2020-09-29 LAB — PREALBUMIN: Prealbumin: <5 mg/dL — ABNORMAL LOW (ref 18–38)

## 2020-09-29 LAB — PREPARE RBC (CROSSMATCH)

## 2020-09-29 LAB — SURGICAL PATHOLOGY

## 2020-09-29 LAB — TYPE AND SCREEN
ABO/RH(D): O NEG
Antibody Screen: NEGATIVE

## 2020-09-29 LAB — PHOSPHORUS: Phosphorus: 2.9 mg/dL (ref 2.5–4.6)

## 2020-09-29 SURGERY — LAPAROTOMY, EXPLORATORY
Anesthesia: General | Site: Abdomen

## 2020-09-29 MED ORDER — ROCURONIUM BROMIDE 10 MG/ML (PF) SYRINGE
PREFILLED_SYRINGE | INTRAVENOUS | Status: DC | PRN
  Administered 2020-09-29: 100 mg via INTRAVENOUS
  Administered 2020-09-29: 50 mg via INTRAVENOUS

## 2020-09-29 MED ORDER — PROPOFOL 10 MG/ML IV BOLUS
INTRAVENOUS | Status: AC
  Filled 2020-09-29: qty 20

## 2020-09-29 MED ORDER — ZINC CHLORIDE 1 MG/ML IV SOLN
INTRAVENOUS | Status: AC
  Filled 2020-09-29: qty 1137.6

## 2020-09-29 MED ORDER — ONDANSETRON HCL 4 MG/2ML IJ SOLN
INTRAMUSCULAR | Status: DC | PRN
  Administered 2020-09-29: 4 mg via INTRAVENOUS

## 2020-09-29 MED ORDER — MIDAZOLAM HCL 2 MG/2ML IJ SOLN
INTRAMUSCULAR | Status: AC
  Filled 2020-09-29: qty 2

## 2020-09-29 MED ORDER — ALBUMIN HUMAN 5 % IV SOLN: INTRAVENOUS | Status: DC | PRN

## 2020-09-29 MED ORDER — ACETAMINOPHEN 10 MG/ML IV SOLN
1000.0000 mg | Freq: Once | INTRAVENOUS | Status: AC | PRN
  Administered 2020-09-30: 1000 mg via INTRAVENOUS
  Filled 2020-09-29: qty 100

## 2020-09-29 MED ORDER — SODIUM BICARBONATE 8.4 % IV SOLN
INTRAVENOUS | Status: DC | PRN
  Administered 2020-09-29 (×2): 50 meq via INTRAVENOUS

## 2020-09-29 MED ORDER — CALCIUM GLUCONATE-NACL 1-0.675 GM/50ML-% IV SOLN
1.0000 g | Freq: Once | INTRAVENOUS | Status: AC
  Administered 2020-09-30: 1000 mg via INTRAVENOUS
  Filled 2020-09-29: qty 50

## 2020-09-29 MED ORDER — DEXAMETHASONE SODIUM PHOSPHATE 10 MG/ML IJ SOLN
INTRAMUSCULAR | Status: DC | PRN
  Administered 2020-09-29: 10 mg via INTRAVENOUS

## 2020-09-29 MED ORDER — LACTATED RINGERS IV SOLN: INTRAVENOUS | Status: DC | PRN

## 2020-09-29 MED ORDER — PIPERACILLIN-TAZOBACTAM 3.375 G IVPB
3.3750 g | Freq: Three times a day (TID) | INTRAVENOUS | Status: DC
  Administered 2020-09-29 – 2020-10-08 (×27): 3.375 g via INTRAVENOUS
  Filled 2020-09-29 (×28): qty 50

## 2020-09-29 MED ORDER — SODIUM CHLORIDE 0.9% IV SOLUTION: Freq: Once | INTRAVENOUS | Status: DC

## 2020-09-29 MED ORDER — VANCOMYCIN HCL 1000 MG/200ML IV SOLN: 1000.0000 mg | INTRAVENOUS | Status: DC

## 2020-09-29 MED ORDER — SODIUM CHLORIDE 0.9 % IV SOLN: INTRAVENOUS | Status: DC | PRN

## 2020-09-29 MED ORDER — FENTANYL CITRATE (PF) 250 MCG/5ML IJ SOLN
INTRAMUSCULAR | Status: AC
  Filled 2020-09-29: qty 5

## 2020-09-29 SURGICAL SUPPLY — 71 items
ADH SKN CLS APL DERMABOND .7 (GAUZE/BANDAGES/DRESSINGS)
APL PRP STRL LF DISP 70% ISPRP (MISCELLANEOUS)
BAG DRAINAGE 600ML DEPOT (BAG) ×1 IMPLANT
BAG DRN 24 TWST VLV ADJ (BAG) ×1
BIOPATCH RED 1 DISK 7.0 (GAUZE/BANDAGES/DRESSINGS) ×2 IMPLANT
BLADE CLIPPER SURG (BLADE) IMPLANT
BNDG GAUZE ELAST 4 BULKY (GAUZE/BANDAGES/DRESSINGS) ×1 IMPLANT
CANISTER SUCT 3000ML PPV (MISCELLANEOUS) ×6 IMPLANT
CATH GASTROSTOMY 20FR (CATHETERS) ×1 IMPLANT
CHLORAPREP W/TINT 26 (MISCELLANEOUS) ×1 IMPLANT
COVER SURGICAL LIGHT HANDLE (MISCELLANEOUS) ×2 IMPLANT
COVER WAND RF STERILE (DRAPES) ×2 IMPLANT
DERMABOND ADVANCED (GAUZE/BANDAGES/DRESSINGS)
DERMABOND ADVANCED .7 DNX12 (GAUZE/BANDAGES/DRESSINGS) ×2 IMPLANT
DRAIN CHANNEL 19F RND (DRAIN) ×2 IMPLANT
DRAPE INCISE IOBAN 66X45 STRL (DRAPES) ×1 IMPLANT
DRAPE LAPAROSCOPIC ABDOMINAL (DRAPES) ×2 IMPLANT
DRAPE WARM FLUID 44X44 (DRAPES) ×2 IMPLANT
DRSG OPSITE POSTOP 4X10 (GAUZE/BANDAGES/DRESSINGS) IMPLANT
DRSG OPSITE POSTOP 4X8 (GAUZE/BANDAGES/DRESSINGS) IMPLANT
DRSG PAD ABDOMINAL 8X10 ST (GAUZE/BANDAGES/DRESSINGS) ×2 IMPLANT
DRSG TEGADERM 4X4.75 (GAUZE/BANDAGES/DRESSINGS) ×2 IMPLANT
ELECT BLADE 6.5 EXT (BLADE) IMPLANT
ELECT CAUTERY BLADE 6.4 (BLADE) ×2 IMPLANT
ELECT REM PT RETURN 9FT ADLT (ELECTROSURGICAL) ×2
ELECTRODE REM PT RTRN 9FT ADLT (ELECTROSURGICAL) ×1 IMPLANT
EVACUATOR SILICONE 100CC (DRAIN) ×2 IMPLANT
GLOVE BIOGEL PI IND STRL 6 (GLOVE) ×1 IMPLANT
GLOVE BIOGEL PI INDICATOR 6 (GLOVE) ×1
GLOVE BIOGEL PI MICRO 5.5 (GLOVE) ×1
GLOVE BIOGEL PI MICRO STRL 5.5 (GLOVE) ×1 IMPLANT
GOWN STRL REUS W/ TWL LRG LVL3 (GOWN DISPOSABLE) ×2 IMPLANT
GOWN STRL REUS W/ TWL XL LVL3 (GOWN DISPOSABLE) IMPLANT
GOWN STRL REUS W/TWL LRG LVL3 (GOWN DISPOSABLE) ×4
GOWN STRL REUS W/TWL XL LVL3 (GOWN DISPOSABLE) ×2
HANDLE SUCTION POOLE (INSTRUMENTS) ×1 IMPLANT
KIT BASIN OR (CUSTOM PROCEDURE TRAY) ×2 IMPLANT
KIT TURNOVER KIT B (KITS) ×2 IMPLANT
LIGASURE IMPACT 36 18CM CVD LR (INSTRUMENTS) ×1 IMPLANT
NS IRRIG 1000ML POUR BTL (IV SOLUTION) ×11 IMPLANT
PACK GENERAL/GYN (CUSTOM PROCEDURE TRAY) ×2 IMPLANT
PAD ARMBOARD 7.5X6 YLW CONV (MISCELLANEOUS) ×2 IMPLANT
PENCIL SMOKE EVACUATOR (MISCELLANEOUS) ×2 IMPLANT
PREP POVIDONE IODINE SPRAY 2OZ (MISCELLANEOUS) ×1 IMPLANT
RELOAD PROXIMATE 75MM GREEN (ENDOMECHANICALS) ×4 IMPLANT
RELOAD STAPLE 75 4.5 GRN THCK (ENDOMECHANICALS) IMPLANT
SLEEVE SUCTION CATH 165 (SLEEVE) ×2 IMPLANT
SOL PREP POV-IOD 4OZ 10% (MISCELLANEOUS) ×1 IMPLANT
SPECIMEN JAR LARGE (MISCELLANEOUS) IMPLANT
SPONGE LAP 18X18 RF (DISPOSABLE) ×2 IMPLANT
STAPLER PROXIMATE 75MM BLUE (STAPLE) ×1 IMPLANT
STAPLER VISISTAT 35W (STAPLE) IMPLANT
SUCTION POOLE HANDLE (INSTRUMENTS) ×2
SUT 5.0 PDS RB-1 (SUTURE) ×2
SUT ETHILON 2 0 FS 18 (SUTURE) ×5 IMPLANT
SUT MNCRL AB 4-0 PS2 18 (SUTURE) ×4 IMPLANT
SUT PDS AB 1 TP1 96 (SUTURE) ×6 IMPLANT
SUT PDS AB 4-0 RB1 27 (SUTURE) ×2 IMPLANT
SUT PDS PLUS AB 5-0 RB-1 (SUTURE) IMPLANT
SUT SILK 2 0 SH CR/8 (SUTURE) ×2 IMPLANT
SUT SILK 2 0 TIES 10X30 (SUTURE) ×2 IMPLANT
SUT SILK 3 0 SH CR/8 (SUTURE) ×2 IMPLANT
SUT SILK 3 0 TIES 10X30 (SUTURE) ×2 IMPLANT
SUT VIC AB 3-0 SH 18 (SUTURE) IMPLANT
SUT VIC AB 3-0 SH 27 (SUTURE) ×4
SUT VIC AB 3-0 SH 27XBRD (SUTURE) ×2 IMPLANT
TAPE CLOTH SURG 6X10 WHT LF (GAUZE/BANDAGES/DRESSINGS) ×1 IMPLANT
TOWEL GREEN STERILE (TOWEL DISPOSABLE) ×2 IMPLANT
TRAY FOLEY MTR SLVR 16FR STAT (SET/KITS/TRAYS/PACK) IMPLANT
TUBE FEEDING 8FR 16IN STR KANG (MISCELLANEOUS) ×1 IMPLANT
YANKAUER SUCT BULB TIP NO VENT (SUCTIONS) IMPLANT

## 2020-09-29 NOTE — Progress Notes (Signed)
2 Days Post-Op  Subjective: Increasing pressor requirement, now on epi and vaso. Persistent low grade fevers. WBC trending up. Rising creatinine with decreasing UOP.   Objective: Vital signs in last 24 hours: Temp:  [98.6 F (37 C)-100.8 F (38.2 C)] 100.58 F (38.1 C) (03/21 0600) Pulse Rate:  [76-114] 91 (03/21 0721) Resp:  [18-26] 23 (03/21 0721) BP: (84-133)/(49-75) 91/57 (03/21 0721) SpO2:  [91 %-100 %] 93 % (03/21 0721) Arterial Line BP: (69-185)/(33-86) 100/48 (03/21 0600) FiO2 (%):  [30 %] 30 % (03/21 0721) Last BM Date:  (pta)  Intake/Output from previous day: 03/20 0701 - 03/21 0700 In: 4906.4 [I.V.:3881; IV Piggyback:1025.4] Out: 2355 [Urine:1290; Emesis/NG output:300; Drains:765] Intake/Output this shift: No intake/output data recorded.  PE: General: intubated, sedated. Anasarca. HEENT: OG in place draining serosanguinous fluid. ETT in place. CV: RRR Resp: intubated, on vent Abdomen: Distended, midline incision with staples in place. LUQ pancreatic stent draining clear colorless fluid. GU: foley in place, draining concentrated urine.  Lab Results:  Recent Labs    09/28/20 0533 10/08/2020 0438  WBC 16.5* 24.5*  HGB 9.5* 9.0*  HCT 29.5* 29.2*  PLT 102* 99*   BMET Recent Labs    09/28/20 0533 10/02/2020 0438  NA 138 140  K 4.1 4.0  CL 110 111  CO2 22 22  GLUCOSE 193* 179*  BUN 42* 56*  CREATININE 2.21* 2.93*  CALCIUM 7.8* 8.0*   PT/INR Recent Labs    09/19/2020 1909  LABPROT 16.9*  INR 1.4*   CMP     Component Value Date/Time   NA 140 10/02/2020 0438   NA 142 07/21/2020 0921   K 4.0 10/04/2020 0438   CL 111 10/01/2020 0438   CO2 22 09/16/2020 0438   GLUCOSE 179 (H) 09/19/2020 0438   BUN 56 (H) 09/28/2020 0438   BUN 16 07/21/2020 0921   CREATININE 2.93 (H) 09/09/2020 0438   CREATININE 1.16 03/01/2016 1607   CALCIUM 8.0 (L) 10/09/2020 0438   PROT 4.0 (L) 10/01/2020 0438   PROT 6.7 07/21/2020 0921   ALBUMIN 1.4 (L) 10/03/2020 0438    ALBUMIN 4.1 07/21/2020 0921   AST 35 09/13/2020 0438   ALT 20 09/17/2020 0438   ALKPHOS 67 09/20/2020 0438   BILITOT 4.9 (H) 09/12/2020 0438   BILITOT 0.8 07/21/2020 0921   GFRNONAA 25 (L) 10/05/2020 0438   GFRNONAA >89 09/22/2015 1008   GFRAA 82 07/21/2020 0921   GFRAA >89 09/22/2015 1008   Lipase     Component Value Date/Time   LIPASE 23 08/22/2020 0318       Studies/Results: No results found.    Assessment/Plan 50 yo male with a neuroendocrine tumor of the head of the pancreas, POD7 s/p right hemicolectomy, end ileostomy, Whipple and patch angioplasty repair of SMV. Complicated by postoperative hemorrhagic shock with DIC with extensive small bowel necrosis. Unfortunately at his most recent exploration there was necrosis of the majority of the remaining small bowel including the pancreaticobiliary limb. This is not a survivable situation. There are ongoing goals of care discussions with palliative care, and the family does not want to transition to comfort care at this time. No family was present this morning at the time of my exam. For now we will continue our current supportive care, but I expect the patient will slowly decline over the next few days due to septic shock. - Continue TPN - On antibiotics - Versed and dilaudid for sedation and pain control - VTE: prophylactic lovenox -  Dispo: ICU   LOS: 7 days    Michaelle Birks, MD [facility] Surgery General, Hepatobiliary and Pancreatic Surgery 09/21/2020 8:37 AM

## 2020-09-29 NOTE — Progress Notes (Signed)
PHARMACY - TOTAL PARENTERAL NUTRITION CONSULT NOTE  Indication:  Expected prolonged ileus  Patient Measurements: Height: 6\' 2"  (188 cm) Weight: 95.3 kg (210 lb) IBW/kg (Calculated) : 82.2 TPN AdjBW (KG): 95.3 Body mass index is 26.96 kg/m.  Assessment:  19 YOM with history of mass adjacent to head of the pancreas in 2021.  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  Developed cholecystitis in Feb 2022 and had percutaneous cholecystectomy tube placement.  Presented this admission for neuroendocrine tumor resection with Whipple procedure, also had right total colectomy, colostomy and cholecystectomy on 09/15/2020.  SMV was lacerated during procedure and repaired by bovine pericardial patch.  Developed hemorrhagic shock with bile leak post-op and underwent placement of biliary T tube with abdominal washout on 3/15 PM.  Abdomen remains open.  Pharmacy consulted to manage TPN.  Glucose / Insulin: no hx DM, A1c 5.2% - hypoglycemic on 3/16 PM. CBGs mostly <180, 18 units SSI used in last 24h Electrolytes: all WNL - CoCa WNL (iCa low at 1.06), Mag up to 2.3 (s/p Mag sulfate 2g IV x 1 yesterday; goal >/= 2) Renal: AKI, SCr up to 2.93 (BL ~1-1.2), BUN up to 56 Hepatic: LFTs normalized, tbili down to 4.9, TG down to 198 (off propofol). Albumin 1.4, BL prealbumin <5 Intake / Output; MIVF: UOP 0.6 ml/kg/hr, drain output 1155 ml/24hrs, 150 ml NGT output/24hrs, EBL ~12L on 3/15-3/16, +8.3 ml, (concentrate TPN per CCM) GI Imaging: none since TPN start GI Surgeries / Procedures: 3/14 Whipple procedure with en-bloc right hemicolectomy and end ileostomy, cholecystectomy 3/15 OR for washout, VAC placement 3/17 OR for washout, VAC replacement.  Unlikely to close abdomen d/t edema - necrosis of the entire small bowel except for the PB limb and proximal 40-50cm of jejunum 3/19 OR for re-opening laparotomy, abdominal closure, found to have necrotic SB and deemed not survivable  condition  Central access: CVC double lumen placed 09/26/2020 TPN start date: 09/26/19  Nutritional Goals (RD rec on 3/18): 2500-2900 kCal and 165-190g AA per day, fluid > 2L/day Goal TPN is 90 ml/hr with 24g/L ILE (ILE 20% of total kCal) = 170g AA, 400g CHO and 52g ILE for a total of 2559 kCal per day  Current Nutrition:  TPN; NPO  Plan:  Continue concentrated TPN at goal rate of 90 ml/hr. This TPN provides 170 g of protein, 400 g of dextrose for a total of 2041 kcals meeting 100% protein need, 81% of kcal needs Continue with no lipids in TPN today due to previously elevated TG. Propofol remains off. Consider adding in tomorrow if TG normalize Electrolytes in TPN: continue same today - Na 25 mEq/L, K 75mEq/L, Ca 27mEq/L, Mag 87mEq/L, no Phos, max acetate  Add MVI, remove trace elements and add back zinc, chromium, selenium (scleral icterus noted) Continue moderate SSI Q4H and adjust as needed F/U TPN labs Mon/Thurs, GOC   Arturo Morton, PharmD, BCPS Please check AMION for all [place] contact numbers Clinical Pharmacist 09/21/2020 7:44 AM

## 2020-09-29 NOTE — Progress Notes (Signed)
[place] Progress Note Patient Name: [name] DOB: 1971/01/21 MRN: [id number]   Date of Service  09/15/2020  HPI/Events of Note  ABG request for vent changes as previously discussed with bedside team  eICU Interventions  ABG ordered     Intervention Category Minor Interventions: Other:  Judd Lien 10/06/2020, 11:08 PM

## 2020-09-29 NOTE — Progress Notes (Signed)
Urine output decreased to 75ml/hr. Dr Georgette Dover notified, no new orders.

## 2020-09-29 NOTE — Progress Notes (Signed)
Pharmacy Antibiotic Note  [name] is a 50 y.o. male admitted on 10/01/2020 for neuroendocrine tumor s/p Whipple procedure and cholecystectomy, right colectomy, colostomy. SMA lacerated intra-op s/p bovine pericardial patch on 09/12/2020. Hemorrhagic shock post-op s/p massive transfusion protocol. S/p washout with biliary tube placement on 10/08/2020. Abdomen remains open.  Pharmacy has been consulted for vancomycin dosing for intra-abdominal coverage. Also starting Rocephin and Flagyl.  Re-opening of recent laparotomy with necrotic SB thought to be not survivable on 3/19. Family wishes to continue IV abx. Renal function worsening - SCr up to 2.93 today.  Plan: Empirically adjust vancomycin to 1g IV q24h for worsening renal function - may need to hold and check random level if SCr continues upward trend Goal AUC 400-550. Estimated AUC: 431, SCr used 2.93 CTX 2g IV q24h Flagyl 500mg  IV q8h Monitor renal fxn, vanc level as indicated  Height: 6\' 2"  (188 cm) Weight: 95.3 kg (210 lb) IBW/kg (Calculated) : 82.2  Temp (24hrs), Avg:99.6 F (37.6 C), Min:98.6 F (37 C), Max:100.8 F (38.2 C)  Recent Labs  Lab 10/04/2020 0427 09/17/2020 0955 09/26/20 0522 09/26/20 1208 09/26/20 1541 09/26/20 1941 09/26/20 2341 09/17/2020 0553 09/15/2020 0651 09/26/2020 0721 09/10/2020 1200 09/28/20 0533 09/12/2020 0438  WBC 6.4  --  8.7  --   --   --   --   --   --  12.4*  --  16.5* 24.5*  CREATININE 3.10*  --  2.39*  --   --   --   --  2.05*  --   --   --  2.21* 2.93*  LATICACIDVEN  --    < >  --  1.7 1.6 1.7 1.6  --  4.3*  --   --   --   --   VANCORANDOM  --   --   --   --   --   --   --   --   --   --  9  --   --    < > = values in this interval not displayed.    Estimated Creatinine Clearance: 35.5 mL/min (A) (by C-G formula based on SCr of 2.93 mg/dL (H)).    Allergies  Allergen Reactions  . Ace Inhibitors Cough    Vanc 3/17 >> CTX 3/17 >> Flagyl 3/17 >>  3/14 MRSA PCR - negative   Arturo Morton, PharmD, BCPS Please check AMION for all [place] contact numbers Clinical Pharmacist 10/06/2020 11:31 AM

## 2020-09-29 NOTE — Op Note (Signed)
Date: 10/08/2020  Patient: [name] MRN: [id number]  Preoperative Diagnosis: Small bowel ischemia Postoperative Diagnosis: Same  Procedure: Small bowel resection, take down of choledochojejunal anastomosis and pancreaticojejunal anastomosis, take down of duodenojejunal anastomosis, placement of externalized biliary drain, placement of Stamm gastrostomy tube, intraabdominal washout and placement of intraperitoneal drains  Surgeon: Michaelle Birks, MD Assistant: Georganna Skeans, MD  EBL: 50 mL  Anesthesia: General  Specimens: Small bowel  Indications: Mr. Boxx is a 50 yo male who underwent a Whipple and right colectomy one week ago for a T3N0 neuroendocrine tumor of the head of pancreas. His postoperative course has been complicated by hemorrhagic shock with DIC, for which he underwent re-exploration with abdominal packing, as well as an IR angiogram. After his DIC resolved, he was explored on POD3 and the majority of his small bowel was necrotic. This was resected. He was taken back on POD5 for another exploration, at which time had about 10cm of viable bowel beyond his duodenojejunal anastomosis, with diffuse patchy necrosis of the pancreaticobiliary limb. This was thought to be nonsurvivable and his abdomen was closed. After further extensive discussions with the family, and discussions with surgeons at [facility] regarding the possibility of a small bowel transplant, the decision has been made to bring the patient back today to resect the remaining small bowel to obtain source control, in the remote possibility that the patient could someday be a transplant candidate.  Findings: Necrosis of the jejunum beyond the duodenojejunal anastomosis, as well as diffuse patchy necrosis of the pancreaticobiliary limb. Entire remaining small bowel was resected, including the PB limb. Externalized drains was placed in the bile duct, and the preexisting pancreatic duct stent was left in place. Stamm gastrostomy  tube placed for gastric drainage.  Procedure details: Informed consent was obtained from the patient's family prior to the procedure. The patient was brought to the operating room, was already intubated, and placed on the table in the supine position. General anesthesia was induced and appropriate lines and drains were placed for intraoperative monitoring. Perioperative antibiotics were administered per SCIP guidelines. The abdomen was prepped and draped in the usual sterile fashion. A pre-procedure timeout was taken verifying patient identity, surgical site and procedure to be performed.   The previous skin staples were removed. The fascial sutures were cut and the fascia opened. There was copious drainage of bile-tinged, turbid fluid containing old blood on entry into the abdomen. The majority of the remaining small bowel was necrotic, including large patchy necrotic areas throughout the pancreaticobiliary limb. The small bowel was transected just distal to the duodenojejunal anastomosis using a 50mm GIA stapler blue load. The mesentery was then divided using ligasure and the distal small bowel was passed off the field. Next the distal stomach was cleared of adjacent omentum. The stomach was transected just proximal to the pylorus using two loads of a 45mm GIA stapler with green loads, ensuring that the OG tube tip was free prior to firing the stapler. The choledochojejunal anastomosis was then identified, and appeared to be largely in tact. The common bile duct was divided just proximal to the anastomosis using a 15-blade scalpel. Bleeding from the edges of the bile duct was controlled with cautery. Next the pancreaticojejunal anastomosis was identified. This was very friable and was carefully taken down using gentle blunt finger dissection. The SMV and portal vein were identified deep to the anastomosis and injury to these structures was avoided. The externalized pancreatic duct stent remained in place  across the anastomosis,  and this stent was preserved. The bowel surrounding the stent was sharply cut away to completely free the pancreatic stent without pulling it out of the pancreatic duct. At this point all the anastomoses to the small bowel had been taken down, and the mesentery was divided with ligasure. The remaining small bowel was passed off the field, and all specimens were sent to routine pathology. There was some remaining necrotic mesentery which was removed with ligasure, as well as a necrotic portion of omentum. The colon was examined and appeared viable. The abdomen was then thoroughly irrigated with several liters of warmed saline.  Next, the externalized pancreatic duct stent was secured in place to the pancreatic duct using a 5-0 PDS suture. An 8-Fr pediatric feeding tube was brought onto the field and used to cannulate the common bile duct. Two 4-0 PDS sutures were placed through the edges of the bile duct and tied around the feeding tube to secure it in place within the bile duct. The end of the feeding tube was then brought out through the right abdominal wall and secured to the skin with 2-0 Nylon. Next two rows of pursestring sutures were placed on the distal anterior stomach using 2-0 silk suture. A gastrotomy was made and a 20-Fr mic G tube was inserted into the stomach, and the balloon was inflated with 56mL sterile water. The pursestring sutures were tied down to secure the tube in place. The stomach was then pexied to the abdominal wall around the tube to create a Stamm gastrostomy, using interrupted 2-0 silk suture. The G tube was then pulled flush with the abdominal wall, and the bumper was secured to the skin (6cm at the skin) using 2-0 Nylon suture. The abdomen was again irrigated and appeared hemostatic. Two 19-Fr JP drains were brought onto the field and brought through the right abdominal wall. The posterior drain was placed adjacent to the biliary drain and remnant pancreas.  The anterior drain was looped around the right side of the peritoneal cavity in the dependent portion. Both drains were secured to the skin using 2-0 Nylon suture. The fascia was closed with a running 1 PDS suture. The skin was left open and packed with saline-moistened kerlex.  All counts were correct x2 at the end of the procedure. The patient remained intubated and was transported back to the ICU for further care.  Michaelle Birks, MD 09/28/2020   3:32 PM

## 2020-09-29 NOTE — Progress Notes (Signed)
[place] Progress Note Patient Name: [name] DOB: May 16, 1971 MRN: [id number]   Date of Service  09/12/2020  HPI/Events of Note  Ca++ = 8.0 which corrects to 10.08 (Normal) given albumin = 1.4.  eICU Interventions  No intervention indicated for a normal Ca++ level.      Intervention Category Major Interventions: Electrolyte abnormality - evaluation and management  Illeana Edick Eugene 09/21/2020, 6:02 AM

## 2020-09-29 NOTE — Anesthesia Preprocedure Evaluation (Signed)
Anesthesia Evaluation  Patient identified by MRN, date of birth, ID band Patient unresponsive    Reviewed: Allergy & Precautions, Patient's Chart, lab work & pertinent test resultsPreop documentation limited or incomplete due to emergent nature of procedure.  Airway Mallampati: Intubated       Dental   Pulmonary former smoker,     + decreased breath sounds      Cardiovascular hypertension,  Rhythm:Regular Rate:Normal     Neuro/Psych    GI/Hepatic   Endo/Other    Renal/GU      Musculoskeletal   Abdominal (+) - obese,   Peds  Hematology   Anesthesia Other Findings   Reproductive/Obstetrics                             Anesthesia Physical Anesthesia Plan  ASA: IV and emergent  Anesthesia Plan: General   Post-op Pain Management:    Induction:   PONV Risk Score and Plan:   Airway Management Planned: Oral ETT  Additional Equipment:   Intra-op Plan:   Post-operative Plan: Post-operative intubation/ventilation  Informed Consent: I have reviewed the patients History and Physical, chart, labs and discussed the procedure including the risks, benefits and alternatives for the proposed anesthesia with the patient or authorized representative who has indicated his/her understanding and acceptance.       Plan Discussed with: CRNA and Anesthesiologist  Anesthesia Plan Comments: (50 year old male S/P Whipple procedure with diffuse  intestinal ischemia and sepsis. Worsening renal insufficiency and increasing pressor requirements.  Plan supportive care with post-op ventilation. Use existing art line and central lines.  Roberts Gaudy)        Anesthesia Quick Evaluation

## 2020-09-29 NOTE — Progress Notes (Signed)
ABG results given to Dr. Tamala Julian. Verbal  Order received to increase RR to 30 and FiO2 to 50%. Verbal order also received to obtain repeat ABG at 2200. RT will continue to monitor and be available as needed. RN made aware of changes.

## 2020-09-29 NOTE — Plan of Care (Signed)
TPN only nutrition at this time.

## 2020-09-29 NOTE — Anesthesia Postprocedure Evaluation (Signed)
Anesthesia Post Note  Patient: [name]  Procedure(s) Performed: RE-EXPLORATORY LAPAROTOMY (N/A Abdomen) SMALL BOWEL RESECTION (N/A Abdomen) INSERTION OF GASTROSTOMY TUBE (N/A Abdomen)     Patient location during evaluation: SICU Anesthesia Type: General Level of consciousness: sedated Pain management: pain level controlled Vital Signs Assessment: post-procedure vital signs reviewed and stable Respiratory status: patient remains intubated per anesthesia plan Cardiovascular status: stable Postop Assessment: no apparent nausea or vomiting Anesthetic complications: no   No complications documented.  Last Vitals:  Vitals:   10/08/2020 1100 10/06/2020 1200  BP: 90/60 110/68  Pulse: 90 96  Resp: (!) 23 (!) 24  Temp: 37.9 C 37.9 C  SpO2: 96% 95%    Last Pain:  Vitals:   09/26/2020 0800  TempSrc: Bladder  PainSc:                  Yoltzin Barg,W. EDMOND

## 2020-09-29 NOTE — Progress Notes (Signed)
[name], MRN:  [id number], DOB:  [birth year], LOS: 7 ADMISSION DATE:  09/11/2020, CONSULTATION DATE:  09/11/2020 REFERRING MD:  Anesthesia, CHIEF COMPLAINT:  Whipple, post-op shock   Brief History:  50 y.o. M with PMH of neuroendocrine tumor and underwent Whipple procedure 3/14 with intra-operative SMV laceration and repair by vascular with bovine pericardial patch.  Pt had worsening shock overnight and required three pressors despite massive transfusion protocol (received 150 blood products).  He was taken back to the OR for washout on 3/15 and returned to the ICU intubated.  Taken back 3/17 again found to have small bowel necrosis s/p resection.  Plans again to return 3/19 to OR.   Past Medical History:   has a past medical history of AICD (automatic cardioverter/defibrillator) present, Dyslipidemia, Heart failure with mildly reduced EF, History of kidney stones, HTN (hypertension), ventricular fibrillation, and Nonischemic cardiomyopathy.   Significant Hospital Events:  3/14 Admit to Surgery, to OR for whipple, SMV injury and repair, massive transfusion protocol overnight  3/15 Back to OR for washout, x2. IR for arteriogram. No major bleed source identified in OR, or arterial bleed w IR. Robust product resuscitation >75 products (+ TXA + novoseven) 3/16 off pressors, add'l 39 products overnight. Slowing resuscitation this morning with hemodynamic improvements and slowing drain output; OR x 2 - washout, biliary t tube, wound vac -- washout, wound vac, IR for arteriogram -- no arterial bleed for embolization 3/17 Aggressive balanced transfusion continue overnight with marked hemodynamic improvement since yesterday evening. Off pressors x several hours, Wound vac output significantly slowing (from 565ml q15-73min to 51ml q1hr+) and output is much thinner, Thin bloody secretions from mouth approx 233ml, Decreased RR from 22 to 18, Unable to tolerate NE- severe bradycardia  Consults:   PCCM VVS  IR   Procedures:  3/15 ETT 3/15 R IJ cordis/ CVL>> 3/15 left aline >>  Significant Diagnostic Tests:  3/15 mesenteric arteriogram> no identified arterial source of bleed 3/16 OR wash out, wound vac, IR for ateriogram 3/17 Remains on Epi 6 mcg/min and vasopressin, following some commands, back to OR found to have small bowel necrosis s/p resection, sCr 2.7-> 3.1, Wound vac 1.7L/ 24hr, Hgb stable 11.5-> 11.7 3/19 back to OR, no salvageable small bowel  3/20 palliative meeting with family regarding goals of care - continue Full Code. Dilaudid infusion for comfort. [facility] contacted regarding transfer for second opinion, but they felt transfer would not change outcome.  Micro Data:  3/15 MRSA>>negative  Antimicrobials:  Ancef 3/14 Flagyl 3/14 3/17 flagyl >> 3/17 ceftriaxone >> 3/18 vanc >>  Interim History / Subjective:  Started on vasopressin overnight for worsening shock.   Objective   Blood pressure (!) 89/58, pulse 93, temperature (!) 100.4 F (38 C), resp. rate (!) 24, height 6\' 2"  (1.88 m), weight 95.3 kg, SpO2 94 %.    Vent Mode: PRVC FiO2 (%):  [30 %] 30 % Set Rate:  [18 bmp] 18 bmp Vt Set:  [580 mL] 580 mL PEEP:  [5 cmH20] 5 cmH20 Plateau Pressure:  [28 cmH20-30 cmH20] 28 cmH20   Intake/Output Summary (Last 24 hours) at 10/08/2020 0950 Last data filed at 10/03/2020 0900 Gross per 24 hour  Intake 5414.92 ml  Output 2355 ml  Net 3059.92 ml   Filed Weights   09/13/2020 0639  Weight: 95.3 kg    Examination: General:  Critically ill appearing adult male.  HEENT: ETT, [place]/AT, Scleral icterus. Pupils 71mm and sluggish.  Neuro: Sedated CV: RRR,  on murmur PULM:  Vent supported breaths. Vent synchrony. Coarse bilaterally.  GI: abdomen distended. Drains with bile-like drainage GU: Foley, minimal uop Extremities: anasarca Skin: no rashes   Resolved Hospital Problem list   Hemorrhagic shock-now resolved  Assessment & Plan:   Critically ill due to acute  respiratory failure with hypoxia, requiring mechanical ventilaion - Full ventilatory support. Not candidate for SBT or WUA at this time.  - VAP bundle  Critically ill due to distributive shock due small bowel infarction. Essentially entire small bowel is unsalvageable per Op note 3/19. - Continue current hemodynamic support (epinephrine infusion, vasopressin infusion)  with no escalation of vasopressors at this point as it would not provide benefit.  - MAP goal 90mmHg - Non-survivable injury  Compensated nonischemic cardiomyopathy with ICD.   AKI  Neuroendocrine tumor s/p whipple and r hemicolectomy, smv repair (3/14); s/p washout x 2 with biliary t tube placement, wound vac application 1/60  - abdomen closed 3/19 - management per CCS - Monitor drain output.   GOC - Pain/sedation infusions at the direction of palliative care. Will await ongoing instructions.  -Will likely become increasingly septic over time as small bowel completely necroses.   Daily Goals Checklist  Pain/Anxiety/Delirium protocol (if indicated): Versed and hydromorphine infusions VAP protocol (if indicated): bundle in place.  Respiratory support goals: Full ventilatory support Blood pressure target: Titrate epinephrine/vaso to keep MAP 65. DVT prophylaxis: SCDs Nutrition Status: On TPN GI prophylaxis: Pantoprazole Fluid status goals: Allow autoregulation Urinary catheter: Assessment of intravascular volume, end-of-life care. Central lines: Right IJ central line still needed Glucose control: Adequate control on sliding scale insulin Mobility/therapy needs: Bedrest Antibiotic de-escalation: Continue ceftriaxone and vancomycin Restraints: Restraint type: Soft restraint:  bilateral wrist Reason for restraints: Interference with medical treatment Home medication reconciliation: On hold Daily labs: CBC, CMP daily Code Status: Full code Family Communication: Updated by Dr. Zenia Resides.  Goals of care  discussion Disposition: ICU.   Goals of Care:  Last date of multidisciplinary goals of care discussion: 3/19 Family and staff present: Dr. Lynetta Mare, Ricky Ala and multiple family members Summary of discussion: Family informed that patient has unsalvageable bowel necrosis with no rescue options.  Expectation is the patient will become increasingly septic over time.  Unable to resect bowel because insufficient bowel to create anastomoses.  Family requested second opinion.  Situation discussed with hepatobiliary service at [facility] who concurs. Follow up goals of care discussion due: Palliative care consultation.  Ongoing conversations regarding transition to comfort care Code Status:  Full Code.   Critical care time 40 minutes  Georgann Housekeeper, AGACNP-BC [facility]  See Amion for personal pager PCCM on call pager [phone] until 7pm. Please call Elink 7p-7a. [phone]  09/14/2020 10:07 AM

## 2020-09-29 NOTE — Progress Notes (Addendum)
0830: Request from patient's brother and friend to meet with me this morning regarding the patient's clinical state. Offered the option to include others in the conversation and inclusion of Dr. Zenia Resides was requested. Unfortunately, she was unavailable at the time and family desired to continue the discussion without her. We went through a detailed review of each operative procedure he has undergone up to this point and all questions regarding intra-operative findings were answered to their satisfaction. Multiple questions posed by the friend, all answered to his satisfaction, including the use of pictures that he brought in, pictures, online, and hand-written drawings. He inquired about current organ function status and I explained that most organs are stable although liver and kidneys have already begun to demonstrate some level of dysfunction. Again revisited the topic of small bowel transplant and explained that SBT is not a procedure performed in the acute setting in a critically ill patient and this seemed to be understood. We explored emotions associated with the current situation and an opportunity was provided to allow expression. Family requested to have an additional discussion later in the day when Dr. Zenia Resides was available. Meeting scheduled for 1130AM. Requested the presence of Palliative Medicine Team, however they were unavailable at the time to participate.  Critical care time: 6min   1145: Follow-up meeting held with brother, friend, and Dr. Zenia Resides. Prior to this meeting, another discussion held with [facility], and remote possibility of SBT if debridement of necrotic bowel, externalization of biliary and pancreatic ducts, and stabilization on TPN. Both I and Dr. Zenia Resides agree that there is an extremely unlikely possibility of survival even with this intervention, but there is an absolute zero chance of survival without it. In the interest of pursuing maximal medical interventions for a remote  chance of improvement as requested by the family in this very unfortunate and unexpected situation, Dr. Zenia Resides will proceed with resection today. A very clear explanation of the chances of survival was performed by Dr. Zenia Resides with specific discussion of the high likelihood of continued clinical decline post-operatively, in which case, there are no further interventions to pursue. She explained the risk of liver failure, renal failure, and progression of sepsis.   Additional critical care time: 29min   [name], MD General and [place] [facility]

## 2020-09-29 NOTE — Transfer of Care (Signed)
Immediate Anesthesia Transfer of Care Note  Patient: [name]  Procedure(s) Performed: RE-EXPLORATORY LAPAROTOMY (N/A Abdomen) SMALL BOWEL RESECTION (N/A Abdomen) INSERTION OF GASTROSTOMY TUBE (N/A Abdomen)  Patient Location: NICU  Anesthesia Type:General  Level of Consciousness: Patient remains intubated per anesthesia plan  Airway & Oxygen Therapy: Patient remains intubated per anesthesia plan  Post-op Assessment: Report given to RN and Post -op Vital signs reviewed and stable  Post vital signs: Reviewed and stable  Last Vitals:  Vitals Value Taken Time  BP    Temp 37 C 09/24/2020 1522  Pulse 89 09/14/2020 1522  Resp 23 09/28/2020 1522  SpO2 93 % 10/07/2020 1522  Vitals shown include unvalidated device data.  Last Pain:  Vitals:   09/21/2020 0800  TempSrc: Bladder  PainSc:          Complications: No complications documented.

## 2020-09-29 NOTE — Progress Notes (Signed)
RT attempted SBT with pt this morning. Pt placed on CPAP/PS 14/5. During SBT pt with low tidal volumes and RR up to 45. Pt returned to full support. RT will continue to monitor and be available as needed.

## 2020-09-29 NOTE — Progress Notes (Signed)
eLink Physician-Brief Progress Note Patient Name: [name] DOB: [birth year] MRN: [id number]   Date of Service  10/09/2020  HPI/Events of Note  Notified of fever 101.1 request for IV acetaminophen On piperacillin-tazobactam Continues to be NPO on TPN  eICU Interventions  Ordered one time dose IV acetaminophen Ordered blood cultures, patient at risk for fungemia     Intervention Category Minor Interventions: Routine modifications to care plan (e.g. PRN medications for pain, fever)  Shona Needles Meghana Tullo 10/08/2020, 11:57 PM

## 2020-09-29 NOTE — Progress Notes (Signed)
I again discussed this patient with [facility] surgery at [place] (Dr. Hyman Hopes) who discussed this case with one of their transplant surgeons. They communicated that even without a reconstructed pancreaticobiliary limb, if this patient had adequate external biliary drainage and had source control of abdominal sepsis and was hemodynamically stable, there is a remote possibility that he could be a small bowel transplant candidate at some point. This is not done on an urgent basis and he would need to be stable and tolerating TPN to be considered. I think that to achieve source control and give this patient any chance of improving hemodynamically, we would need to resect his remaining small bowel and place externalized drains into his bile duct, pancreatic duct, and stomach. I discussed this with the patient's brother and family friend and relayed the information I received from [place]. I was very clear that I think overall the likelihood that he will survive and be a candidate for small bowel transplant is very low, but that since he is young and had complete resection of his neuroendocrine tumor I am will to re-explore him and resect his remaining bowel. I discussed that this is not something that is typically done, is beyond the accepted standard of care, and that he may still die of septic shock and/or liver failure even after I remove all the remaining bowel. He may have complications from TPN including liver failure and line sepsis. I also explained that if he survives and stabilizes, he may still not be a candidate for a small bowel transplant and this is something that would have to be determined after extensive workup by the transplant team at [facility]. The family is willing to do anything at this point to achieve even the smallest possibility of survival, and I think at this point we have nothing to lose by resecting the remaining bowel. I discussed that there is a 100% chance he will die if we do nothing further at this  point, and a very likely chance of mortality if I do proceed with further resection. We will proceed to the OR this afternoon where I will resect the remaining ischemic bowel, likely the entire PB limb, and place drains. I was clear that if the patient continues to clinically worsen after this, there will truly be no other options to treat him.  Michaelle Birks, [facility] General, Hepatobiliary and Pancreatic Surgery 09/19/2020 12:32 PM

## 2020-09-29 NOTE — Progress Notes (Signed)
Late entry  Called with nursing staff with a request for family discussion regarding this complex patient. Meeting held with nine family members in person, one family member via video call, Dr. Zenia Resides via phone, and Dr. Windle Guard physically present. Lengthy discussion regarding the patient's clinical state and circumstances leading up to this point. Detailed explanation of DIC and operative findings. Explained the extensive amount of bowel necrosis and the incompatibility with life. We discussed that his other organs are functional at this point, but that the expectation is that they will eventually begin to fail. Family was very unwilling to accept this, understandably, and is advocating for any opportunity to obtain a second option for eligibility for small bowel transplant. Durward Fortes, CCM MD, was brought into the conversation at this point and provided his perspective. The decision was made to pursue a second opinion from [facility], a center that has the capability to perform small bowel transplant. Family was understanding of this offer and expressed gratitude. We briefly explored DNR and comfort care options, which the family is unwilling to consider at this point. Clearly expressed our expectations that resuscitative efforts would not be successful, but in the interest of maintaining shared decision-making, patient will remain full code at this point.   Critical care time: 87min  Trell Secrist N. Tamon Parkerson, MD General and [place] [facility]

## 2020-09-30 ENCOUNTER — Encounter (HOSPITAL_COMMUNITY): Payer: Self-pay | Admitting: Surgery

## 2020-09-30 DIAGNOSIS — D3A8 Other benign neuroendocrine tumors: Secondary | ICD-10-CM | POA: Diagnosis not present

## 2020-09-30 DIAGNOSIS — J9601 Acute respiratory failure with hypoxia: Secondary | ICD-10-CM | POA: Diagnosis not present

## 2020-09-30 DIAGNOSIS — Z978 Presence of other specified devices: Secondary | ICD-10-CM

## 2020-09-30 DIAGNOSIS — R579 Shock, unspecified: Secondary | ICD-10-CM | POA: Diagnosis not present

## 2020-09-30 DIAGNOSIS — K559 Vascular disorder of intestine, unspecified: Secondary | ICD-10-CM | POA: Diagnosis not present

## 2020-09-30 DIAGNOSIS — Z7189 Other specified counseling: Secondary | ICD-10-CM | POA: Diagnosis not present

## 2020-09-30 LAB — TYPE AND SCREEN
ABO/RH(D): O NEG
Antibody Screen: NEGATIVE
Unit division: 0
Unit division: 0

## 2020-09-30 LAB — CBC
HCT: 28.4 % — ABNORMAL LOW (ref 39.0–52.0)
HCT: 28 % — ABNORMAL LOW (ref 39.0–52.0)
Hemoglobin: 9.2 g/dL — ABNORMAL LOW (ref 13.0–17.0)
Hemoglobin: 9.4 g/dL — ABNORMAL LOW (ref 13.0–17.0)
MCH: 28.6 pg (ref 26.0–34.0)
MCH: 29.7 pg (ref 26.0–34.0)
MCHC: 32.4 g/dL (ref 30.0–36.0)
MCHC: 33.6 g/dL (ref 30.0–36.0)
MCV: 88.2 fL (ref 80.0–100.0)
MCV: 88.3 fL (ref 80.0–100.0)
Platelets: 112 10*3/uL — ABNORMAL LOW (ref 150–400)
Platelets: 119 10*3/uL — ABNORMAL LOW (ref 150–400)
RBC: 3.22 MIL/uL — ABNORMAL LOW (ref 4.22–5.81)
RBC: 3.17 MIL/uL — ABNORMAL LOW (ref 4.22–5.81)
RDW: 19.5 % — ABNORMAL HIGH (ref 11.5–15.5)
RDW: 19.7 % — ABNORMAL HIGH (ref 11.5–15.5)
WBC: 33.5 10*3/uL — ABNORMAL HIGH (ref 4.0–10.5)
WBC: 35.6 10*3/uL — ABNORMAL HIGH (ref 4.0–10.5)
nRBC: 1.1 % — ABNORMAL HIGH (ref 0.0–0.2)
nRBC: 1.1 % — ABNORMAL HIGH (ref 0.0–0.2)

## 2020-09-30 LAB — GLUCOSE, CAPILLARY
Glucose-Capillary: 195 mg/dL — ABNORMAL HIGH (ref 70–99)
Glucose-Capillary: 197 mg/dL — ABNORMAL HIGH (ref 70–99)
Glucose-Capillary: 198 mg/dL — ABNORMAL HIGH (ref 70–99)
Glucose-Capillary: 212 mg/dL — ABNORMAL HIGH (ref 70–99)
Glucose-Capillary: 220 mg/dL — ABNORMAL HIGH (ref 70–99)
Glucose-Capillary: 223 mg/dL — ABNORMAL HIGH (ref 70–99)

## 2020-09-30 LAB — BPAM RBC
Blood Product Expiration Date: 202203252359
Blood Product Expiration Date: 202203272359
ISSUE DATE / TIME: 202203211322
ISSUE DATE / TIME: 202203211322
Unit Type and Rh: 9500
Unit Type and Rh: 9500

## 2020-09-30 LAB — TRIGLYCERIDES: Triglycerides: 227 mg/dL — ABNORMAL HIGH (ref <150)

## 2020-09-30 LAB — POCT I-STAT 7, (LYTES, BLD GAS, ICA,H+H)
Acid-base deficit: 6 mmol/L — ABNORMAL HIGH (ref 0.0–2.0)
Bicarbonate: 19.7 mmol/L — ABNORMAL LOW (ref 20.0–28.0)
Calcium, Ion: 1.13 mmol/L — ABNORMAL LOW (ref 1.15–1.40)
HCT: 26 % — ABNORMAL LOW (ref 39.0–52.0)
Hemoglobin: 8.8 g/dL — ABNORMAL LOW (ref 13.0–17.0)
O2 Saturation: 93 %
Patient temperature: 38.6
Potassium: 4.5 mmol/L (ref 3.5–5.1)
Sodium: 140 mmol/L (ref 135–145)
TCO2: 21 mmol/L — ABNORMAL LOW (ref 22–32)
pCO2 arterial: 40.7 mmHg (ref 32.0–48.0)
pH, Arterial: 7.301 — ABNORMAL LOW (ref 7.350–7.450)
pO2, Arterial: 82 mmHg — ABNORMAL LOW (ref 83.0–108.0)

## 2020-09-30 LAB — COMPREHENSIVE METABOLIC PANEL
ALT: 23 U/L (ref 0–44)
AST: 48 U/L — ABNORMAL HIGH (ref 15–41)
Albumin: 1.4 g/dL — ABNORMAL LOW (ref 3.5–5.0)
Alkaline Phosphatase: 78 U/L (ref 38–126)
Anion gap: 10 (ref 5–15)
BUN: 75 mg/dL — ABNORMAL HIGH (ref 6–20)
CO2: 20 mmol/L — ABNORMAL LOW (ref 22–32)
Calcium: 8.2 mg/dL — ABNORMAL LOW (ref 8.9–10.3)
Chloride: 109 mmol/L (ref 98–111)
Creatinine, Ser: 3.13 mg/dL — ABNORMAL HIGH (ref 0.61–1.24)
GFR, Estimated: 23 mL/min — ABNORMAL LOW (ref >60)
Glucose, Bld: 250 mg/dL — ABNORMAL HIGH (ref 70–99)
Potassium: 4.4 mmol/L (ref 3.5–5.1)
Sodium: 139 mmol/L (ref 135–145)
Total Bilirubin: 4.6 mg/dL — ABNORMAL HIGH (ref 0.3–1.2)
Total Protein: 4.3 g/dL — ABNORMAL LOW (ref 6.5–8.1)

## 2020-09-30 LAB — MAGNESIUM
Magnesium: 2.3 mg/dL (ref 1.7–2.4)
Magnesium: 2.4 mg/dL (ref 1.7–2.4)

## 2020-09-30 MED ORDER — HYDROCORTISONE NA SUCCINATE PF 100 MG IJ SOLR
100.0000 mg | Freq: Three times a day (TID) | INTRAMUSCULAR | Status: DC
  Administered 2020-09-30 – 2020-10-02 (×6): 100 mg via INTRAVENOUS
  Filled 2020-09-30 (×6): qty 2

## 2020-09-30 MED ORDER — HEPARIN SODIUM (PORCINE) 5000 UNIT/ML IJ SOLN
5000.0000 [IU] | Freq: Three times a day (TID) | INTRAMUSCULAR | Status: DC
  Administered 2020-09-30 – 2020-10-16 (×48): 5000 [IU] via SUBCUTANEOUS
  Filled 2020-09-30 (×48): qty 1

## 2020-09-30 MED ORDER — ZINC CHLORIDE 1 MG/ML IV SOLN
INTRAVENOUS | Status: AC
  Filled 2020-09-30: qty 1137.6

## 2020-09-30 MED ORDER — SODIUM CHLORIDE 0.9 % IV SOLN
200.0000 mg | Freq: Once | INTRAVENOUS | Status: AC
  Administered 2020-09-30: 200 mg via INTRAVENOUS
  Filled 2020-09-30: qty 200

## 2020-09-30 MED ORDER — SODIUM CHLORIDE 0.9 % IV SOLN
100.0000 mg | INTRAVENOUS | Status: DC
  Administered 2020-10-01 – 2020-10-07 (×7): 100 mg via INTRAVENOUS
  Filled 2020-09-30 (×8): qty 100

## 2020-09-30 NOTE — Progress Notes (Signed)
PHARMACY - TOTAL PARENTERAL NUTRITION CONSULT NOTE  Indication:  Expected prolonged ileus  Patient Measurements: Height: 6\' 2"  (188 cm) Weight: 95.3 kg (210 lb) IBW/kg (Calculated) : 82.2 TPN AdjBW (KG): 95.3 Body mass index is 26.96 kg/m.  Assessment:  51 YOM with history of mass adjacent to head of the pancreas in 2021.  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  Developed cholecystitis in Feb 2022 and had percutaneous cholecystectomy tube placement.  Presented this admission for neuroendocrine tumor resection with Whipple procedure, also had right total colectomy, colostomy and cholecystectomy on 09/12/2020.  SMV was lacerated during procedure and repaired by bovine pericardial patch.  Developed hemorrhagic shock with bile leak post-op and underwent placement of biliary T tube with abdominal washout on 3/15 PM.  Abdomen remains open.  Pharmacy consulted to manage TPN.  Glucose / Insulin: no hx DM, A1c 5.2% - hypoglycemic on 3/16 PM. CBGs previously controlled <180, now elevated in 200s today post-op after receiving decadron x 1. Utilized 17 units SSI used in last 24h Electrolytes: CO2 low 20, CoCa high-normal ~10.3 (iCa low at 1.13 - already given Ca gluconate 1g IV x 1 per MD this AM), others WNL Renal: AKI, SCr up to 3.13 (BL ~1-1.2), BUN up to 75 Hepatic: AST mildly elevated / ALT WNL, tbili down to 4.6, TG back up to 227 (remains off propofol). Albumin 1.4, BL prealbumin <5 Intake / Output; MIVF: UOP 0.7 ml/kg/hr, drain output 1077 ml/24hrs, 150 ml NGT output/24hrs, EBL ~12L on 3/15-3/16, LR at 50 ml/hr; net +10.8L this admit (concentrate TPN per CCM) GI Imaging: none since TPN start GI Surgeries / Procedures: 3/14 Whipple procedure with en-bloc right hemicolectomy and end ileostomy, cholecystectomy 3/15 OR for washout, VAC placement 3/17 OR for washout, VAC replacement.  Unlikely to close abdomen d/t edema - necrosis of the entire small bowel except for the  PB limb and proximal 40-50cm of jejunum 3/19 OR for re-opening laparotomy, abdominal closure, found to have necrotic SB and deemed not survivable condition 3/21 OR for SB resection, take down of choledochojejunal anastomosis and pancreaticojejunal anastomosis, take down of duodenojejunal anastomosis, placement of externalized biliary drain / Stamm gastrostomy tube, intraabdominal washout and placement of intraperitoneal drains  Central access: CVC double lumen placed 09/14/2020 TPN start date: 10/07/2020  Nutritional Goals (RD rec on 3/18): 2500-2900 kCal and 165-190g AA per day, fluid > 2L/day Goal TPN is 90 ml/hr with 24g/L ILE (ILE 20% of total kCal) = 170g AA, 400g CHO and 52g ILE for a total of 2559 kCal per day  Current Nutrition:  TPN; NPO  Plan:  Continue concentrated TPN at goal rate of 90 ml/hr. This TPN provides 170 g of protein, 400 g of dextrose for a total of 2041 kcals meeting 100% protein need, 81% of kcal needs Continue with no lipids in TPN today due to continued elevated TG and trending up again. Propofol remains off. Consider adding in tomorrow if TG trend down Electrolytes in TPN: continue same today - Na 25 mEq/L, K 25mEq/L, Ca 42mEq/L, Mag 80mEq/L, no Phos, max acetate  Add MVI. Remove standard trace elements and add back zinc 5mg , chromium 57mcg, selenium 86mcg (with previous Tbili elevated >6 and scleral icterus noted) Continue moderate SSI Q4H - will hold off on making adjustments at this time, as received steroid dose and previously CBGs were controlled on this regimen D/c MIVF started post-op per discussion with Dr. Tamala Julian (CCM) F/U TPN labs, [place]  Arturo Morton, PharmD, BCPS Please check AMION for all [place] contact numbers Clinical Pharmacist 09/30/2020 10:12 AM

## 2020-09-30 NOTE — Progress Notes (Addendum)
Inpatient Diabetes Program Recommendations  AACE/ADA: New Consensus Statement on Inpatient Glycemic Control (2015)  Target Ranges:  Prepandial:   less than 140 mg/dL      Peak postprandial:   less than 180 mg/dL (1-2 hours)      Critically ill patients:  140 - 180 mg/dL   Lab Results  Component Value Date   GLUCAP 220 (H) 09/30/2020   HGBA1C 5.2 10/09/2020    Review of Glycemic Control Results for TSUGIO, ELISON (MRN [id number]) as of 09/30/2020 12:22  Ref. Range 09/26/2020 07:59 09/26/2020 11:33 09/18/2020 15:33 09/09/2020 20:02 09/28/2020 23:56 09/30/2020 03:58 09/30/2020 08:15 09/30/2020 11:46  Glucose-Capillary Latest Ref Range: 70 - 99 mg/dL 140 (H) 154 (H) 81 171 (H) 204 (H) 212 (H) 223 (H) 220 (H)   Current orders for Inpatient glycemic control:  Novolog 0-15 units Q4  Neuro endocrine tumor whipple 3/14 Will closely monitor glucose trends  Inpatient Diabetes Program Recommendations:    Pt on continuous TPN at 90 ml/hour, insulin not added in for tonight. Trends increased overnight and is remaining elevated today. Pharmacy may need to consider adding insulin in TPN.   Thanks,  Tama Headings RN, MSN, BC-ADM Inpatient Diabetes Coordinator Team Pager [phone] (8a-5p)

## 2020-09-30 NOTE — Progress Notes (Signed)
[name], MRN:  [id number], DOB:  [birth year], LOS: 8 ADMISSION DATE:  09/11/2020, CONSULTATION DATE:  09/30/20 REFERRING MD:  Anesthesia, CHIEF COMPLAINT:  Whipple, post-op shock   Brief History:  50 y.o. M with PMH of neuroendocrine tumor and underwent Whipple procedure 3/14 with intra-operative SMV laceration and repair by vascular with bovine pericardial patch.  Pt had worsening shock overnight and required three pressors despite massive transfusion protocol (received 150 blood products).  He was taken back to the OR for washout on 3/15 and returned to the ICU intubated.  Taken back 3/17 again found to have small bowel necrosis s/p resection.  Plans again to return 3/19 to OR.   Past Medical History:   has a past medical history of AICD (automatic cardioverter/defibrillator) present, Dyslipidemia, Heart failure with mildly reduced EF, History of kidney stones, HTN (hypertension), ventricular fibrillation, and Nonischemic cardiomyopathy.   Significant Hospital Events:  3/14: Admit to Surgery, to OR for whipple, SMV injury and repair, massive transfusion protocol overnight  3/15: Back to OR for washout, x2. IR for arteriogram. No major bleed source identified in OR, or arterial bleed w IR. Robust product resuscitation >75 products (+ TXA + novoseven) 3/16: off pressors, add'l 39 products overnight. Slowing resuscitation this morning with hemodynamic improvements and slowing drain output; OR x 2 - washout, biliary t tube, wound vac -- washout, wound vac, IR for arteriogram -- no arterial bleed for embolization 3/17:  Aggressive balanced transfusion continue overnight with marked hemodynamic improvement since yesterday evening. Off pressors x several hours, Wound vac output significantly slowing (from 574ml q15-3min to 536ml q1hr+) and output is much thinner, Thin bloody secretions from mouth approx 235ml, Decreased RR from 22 to 18, Unable to tolerate NE- severe bradycardia 3/19  taken back to OR. Small bowel noted to be almost completely necrosed. Patient was closed with plans for goals of car discussion.  3/20 GOC family endorses full scope of treatment. Primary team discussed with [facility] the possibility of transfer for small bowel transplant. [facility] felt as though transfer would not change outcome. 3/21 Ongoing discussion with family and [facility]. Patient may be a candidate for transplant if he can stabilize post a small bowel resection. He was taken back to the OR and small bowel was resected.   Consults:  PCCM VVS  IR  Palliative  Procedures:  3/15 ETT 3/15 R IJ cordis/ CVL >> 3/15 left aline >>  Significant Diagnostic Tests:  3/15 mesenteric arteriogram> no identified arterial source of bleed 3/18 Echo > LVEF 40-45%, Grade 1 DD  Micro Data:  3/15 MRSA>>negative  Antimicrobials:  Ancef 3/14 Flagyl 3/14 3/17 flagyl > 3/20 3/17 ceftriaxone > 3/20 3/18 vanc > 3/20  Zosyn 3/21 >  Interim History / Subjective:  Taken back to the OR yesterday for small bowel resection in hopes we can stabilize him enough for transfer to [facility] and consideration of transplantation.   Objective   Blood pressure (!) 119/57, pulse 80, temperature (!) 100.4 F (38 C), resp. rate (!) 30, height 6\' 2"  (1.88 m), weight 95.3 kg, SpO2 97 %.    Vent Mode: PRVC FiO2 (%):  [30 %-50 %] 50 % Set Rate:  [18 bmp-30 bmp] 30 bmp Vt Set:  [580 mL] 580 mL PEEP:  [5 cmH20] 5 cmH20 Plateau Pressure:  [24 cmH20-27 cmH20] 26 cmH20   Intake/Output Summary (Last 24 hours) at 09/30/2020 0827 Last data filed at 09/30/2020 0800 Gross per 24 hour  Intake 5939.45 ml  Output 3622 ml  Net 2317.45 ml   Filed Weights   09/14/2020 0639  Weight: 95.3 kg    Examination: General: Critically ill appearing adult male HEENT: ETT, [place]/AT, Scleral icterus.  Neuro: Sedated CV: RRR, no murmur PULM:  Vent supported breaths.  GI: Abdomen distended. JP drains x 2 with sanguinous, other abdominal drains with  biliary drainage.  GU: Foley Extremities: Anasarca, no acute deformity Skin: No rash, grossly intact. Longitudinal midline abdominal dressing.   Resolved Hospital Problem list   Hemorrhagic shock  Assessment & Plan:   Critically ill due to acute respiratory failure with hypoxia, requiring mechanical ventilaion - Full ventilatory support. Not candidate for SBT at this time. - VAP bundle - ABG reviewed and vent adjusted.  - Hydromorphone and versed infusions for RASS goal -2.   Distributive shock due small bowel infarction. Entire small bowel is unsalvageable per Op note 3/19. He was taken back to OR 3/21 for resection.  - Continue current hemodynamic support (epinephrine infusion, vasopressin infusion). Pressor needs weaning slowly this morning.  - MAP goal 65 mmHg - Zosyn per pharmacy  Neuroendocrine tumor s/p whipple and r hemicolectomy, smv repair (3/14); s/p washout x 2 with biliary t tube placement, wound vac application 3/89. Closed 3/19. Small bowel resection on 3/22.  - management per [facility] - Monitor drain output.  - TPN  - Wound care  Compensated nonischemic cardiomyopathy with ICD: LVEF 45% - hold home bisoprolol, entresto  AKI: creatinine slowly climbing. 11L positive for the admission and continues to be positive day over day. Shock limits diuresis.  - Trend BMP, AM study still pending.  - May need to consider his candidacy for CRRT in the coming days.   [place] - Limited code. No shocks. CPR OK. Hoping to stabilize him for a transfer to [facility].   Daily Goals Checklist  Pain/Anxiety/Delirium protocol (if indicated): Versed and hydromorphine infusions VAP protocol (if indicated): bundle in place.  Respiratory support goals: Full ventilatory support Blood pressure target: Titrate epinephrine/vaso to keep MAP 65. DVT prophylaxis: SCDs Nutrition Status: On TPN GI prophylaxis: Pantoprazole Fluid status goals: Allow autoregulation Urinary catheter: Assessment of  intravascular volume, end-of-life care. Central lines: Right IJ central line still needed Glucose control: Adequate control on sliding scale insulin Mobility/therapy needs: Bedrest Antibiotic de-escalation: Continue ceftriaxone and vancomycin Restraints: Restraint type:NA Reason for restraints:NA Home medication reconciliation: On hold Daily labs: CBC, CMP daily Code Status: Full code Family Communication: Ongoing family discussions by primary team.  Disposition: ICU.   Goals of Care:  Last date of multidisciplinary goals of care discussion: 3/19 Family and staff present: Dr. Lynetta Mare, Ricky Ala and multiple family members Summary of discussion: Family informed that patient has unsalvageable bowel necrosis with no rescue options.  Expectation is the patient will become increasingly septic over time.  Unable to resect bowel because insufficient bowel to create anastomoses.  Family requested second opinion.  Situation discussed with hepatobiliary service at [facility] who concurs. Follow up goals of care discussion due: Palliative care consultation.  Ongoing conversations regarding transition to comfort care Code Status:  Full Code.   Critical care time 43 minutes  Georgann Housekeeper, AGACNP-BC [place] [facility]  See Amion for personal pager PCCM on call pager [phone] until 7pm. Please call Elink 7p-7a. [phone]  09/30/2020 8:27 AM

## 2020-09-30 NOTE — Progress Notes (Signed)
1 Day Post-Op  Subjective: Remains febrile. Good UOP, last Cr/BUN 3.0/60. CMP pending this morning. WBC up to 35. Stable pressor requirement (epi at 15, vaso 0.03).  Objective: Vital signs in last 24 hours: Temp:  [98.78 F (37.1 C)-101.5 F (38.6 C)] 100.8 F (38.2 C) (03/22 0630) Pulse Rate:  [79-98] 81 (03/22 0630) Resp:  [23-31] 30 (03/22 0630) BP: (78-126)/(46-68) 116/54 (03/22 0600) SpO2:  [93 %-100 %] 97 % (03/22 0630) Arterial Line BP: (77-137)/(39-62) 122/54 (03/22 0630) FiO2 (%):  [30 %-50 %] 50 % (03/22 0334) Last BM Date:  (pta)  Intake/Output from previous day: 03/21 0701 - 03/22 0700 In: 5944.2 [I.V.:4700.7; Blood:646; IV Piggyback:597.5] Out: 1025 [Urine:1570; Emesis/NG output:50; Drains:1672; Blood:50] Intake/Output this shift: No intake/output data recorded.  PE: General: intubated, sedated. Anasarca. HEENT: OG in place draining serosanguinous fluid. ETT in place. CV: RRR Resp: intubated, on vent Abdomen: Soft. Midline incision with wet-to-dry dressing in place. RUQ biliary drain with bile. RUQ JPx2 with old blood. LUQ pancreatic stent with clear colorless fluid. G tube to gravity with minimal drainage. GU: foley in place, draining yellow urine  Lab Results:  Recent Labs    09/30/20 0034 09/30/20 0402 09/30/20 0403  WBC 33.5*  --  35.6*  HGB 9.2* 8.8* 9.4*  HCT 28.4* 26.0* 28.0*  PLT 112*  --  119*   BMET Recent Labs    09/21/2020 0438 10/01/2020 1317 10/06/2020 1624 10/01/2020 1715 09/10/2020 2341 09/30/20 0402  NA 140   < > 140   < > 140 140  K 4.0   < > 4.5   < > 4.7 4.5  CL 111  --  109  --   --   --   CO2 22  --  20*  --   --   --   GLUCOSE 179*  --  103*  --   --   --   BUN 56*  --  60*  --   --   --   CREATININE 2.93*  --  3.00*  --   --   --   CALCIUM 8.0*  --  8.0*  --   --   --    < > = values in this interval not displayed.   PT/INR Recent Labs    09/28/2020 1909 10/03/2020 1624  LABPROT 16.9* 16.1*  INR 1.4* 1.4*   CMP      Component Value Date/Time   NA 140 09/30/2020 0402   NA 142 07/21/2020 0921   K 4.5 09/30/2020 0402   CL 109 09/17/2020 1624   CO2 20 (L) 09/17/2020 1624   GLUCOSE 103 (H) 09/14/2020 1624   BUN 60 (H) 09/17/2020 1624   BUN 16 07/21/2020 0921   CREATININE 3.00 (H) 09/30/2020 1624   CREATININE 1.16 03/01/2016 1607   CALCIUM 8.0 (L) 09/15/2020 1624   PROT 4.3 (L) 09/10/2020 1624   PROT 6.7 07/21/2020 0921   ALBUMIN 1.6 (L) 09/21/2020 1624   ALBUMIN 4.1 07/21/2020 0921   AST 50 (H) 09/27/2020 1624   ALT 21 09/28/2020 1624   ALKPHOS 78 10/04/2020 1624   BILITOT 6.8 (H) 09/26/2020 1624   BILITOT 0.8 07/21/2020 0921   GFRNONAA 25 (L) 10/02/2020 1624   GFRNONAA >89 09/22/2015 1008   GFRAA 82 07/21/2020 0921   GFRAA >89 09/22/2015 1008   Lipase     Component Value Date/Time   LIPASE 23 08/22/2020 0318       Studies/Results: No results found.  Assessment/Plan 50 yo male with a neuroendocrine tumor of the head of the pancreas, POD78 s/p right hemicolectomy, end ileostomy, Whipple and patch angioplasty repair of SMV. Complicated by postoperative hemorrhagic shock with DIC with extensive small bowel necrosis. All of his remaining small bowel has been removed and his bile duct, pancreas and stomach are externally drained in a final effort to control his intraabdominal sepsis. He has had no significant clinical improvement overnight. I think his overall prognosis is very poor and he will likely have progressive multiorgan failure secondary to septic shock. I discussed with family yesterday that even with removal of his remaining necrotic bowel I do not think he will survive. - Continue TPN - Zosyn - Supportive care for now - Wound care: saline wet-to-dry dressings BID to midline incision and prior ostomy site - VTE: prophylactic lovenox - Dispo: ICU   LOS: 8 days    Michaelle Birks, MD [facility] Surgery General, Hepatobiliary and Pancreatic Surgery 09/30/20 7:39 AM

## 2020-10-01 DIAGNOSIS — J9601 Acute respiratory failure with hypoxia: Secondary | ICD-10-CM | POA: Diagnosis not present

## 2020-10-01 LAB — MAGNESIUM: Magnesium: 2.5 mg/dL — ABNORMAL HIGH (ref 1.7–2.4)

## 2020-10-01 LAB — COMPREHENSIVE METABOLIC PANEL
ALT: 23 U/L (ref 0–44)
AST: 62 U/L — ABNORMAL HIGH (ref 15–41)
Albumin: 1.4 g/dL — ABNORMAL LOW (ref 3.5–5.0)
Alkaline Phosphatase: 60 U/L (ref 38–126)
Anion gap: 8 (ref 5–15)
BUN: 84 mg/dL — ABNORMAL HIGH (ref 6–20)
CO2: 23 mmol/L (ref 22–32)
Calcium: 8.6 mg/dL — ABNORMAL LOW (ref 8.9–10.3)
Chloride: 109 mmol/L (ref 98–111)
Creatinine, Ser: 2.91 mg/dL — ABNORMAL HIGH (ref 0.61–1.24)
GFR, Estimated: 26 mL/min — ABNORMAL LOW (ref >60)
Glucose, Bld: 201 mg/dL — ABNORMAL HIGH (ref 70–99)
Potassium: 4.1 mmol/L (ref 3.5–5.1)
Sodium: 140 mmol/L (ref 135–145)
Total Bilirubin: 4.4 mg/dL — ABNORMAL HIGH (ref 0.3–1.2)
Total Protein: 4.6 g/dL — ABNORMAL LOW (ref 6.5–8.1)

## 2020-10-01 LAB — CBC
HCT: 24.8 % — ABNORMAL LOW (ref 39.0–52.0)
Hemoglobin: 8.2 g/dL — ABNORMAL LOW (ref 13.0–17.0)
MCH: 29.3 pg (ref 26.0–34.0)
MCHC: 33.1 g/dL (ref 30.0–36.0)
MCV: 88.6 fL (ref 80.0–100.0)
Platelets: 122 10*3/uL — ABNORMAL LOW (ref 150–400)
RBC: 2.8 MIL/uL — ABNORMAL LOW (ref 4.22–5.81)
RDW: 19.6 % — ABNORMAL HIGH (ref 11.5–15.5)
WBC: 24.8 10*3/uL — ABNORMAL HIGH (ref 4.0–10.5)
nRBC: 2.1 % — ABNORMAL HIGH (ref 0.0–0.2)

## 2020-10-01 LAB — GLUCOSE, CAPILLARY
Glucose-Capillary: 190 mg/dL — ABNORMAL HIGH (ref 70–99)
Glucose-Capillary: 191 mg/dL — ABNORMAL HIGH (ref 70–99)
Glucose-Capillary: 215 mg/dL — ABNORMAL HIGH (ref 70–99)
Glucose-Capillary: 222 mg/dL — ABNORMAL HIGH (ref 70–99)
Glucose-Capillary: 223 mg/dL — ABNORMAL HIGH (ref 70–99)
Glucose-Capillary: 229 mg/dL — ABNORMAL HIGH (ref 70–99)

## 2020-10-01 LAB — PHOSPHORUS: Phosphorus: 1.7 mg/dL — ABNORMAL LOW (ref 2.5–4.6)

## 2020-10-01 LAB — TRIGLYCERIDES: Triglycerides: 249 mg/dL — ABNORMAL HIGH (ref <150)

## 2020-10-01 MED ORDER — SODIUM PHOSPHATES 45 MMOLE/15ML IV SOLN
30.0000 mmol | Freq: Once | INTRAVENOUS | Status: AC
  Administered 2020-10-01: 30 mmol via INTRAVENOUS
  Filled 2020-10-01: qty 10

## 2020-10-01 MED ORDER — ZINC CHLORIDE 1 MG/ML IV SOLN
INTRAVENOUS | Status: AC
  Filled 2020-10-01: qty 1137.6

## 2020-10-01 MED ORDER — POTASSIUM PHOSPHATES 15 MMOLE/5ML IV SOLN
30.0000 mmol | Freq: Once | INTRAVENOUS | Status: DC
  Filled 2020-10-01: qty 10

## 2020-10-01 MED ORDER — POTASSIUM & SODIUM PHOSPHATES 280-160-250 MG PO PACK: 1.0000 | PACK | Freq: Three times a day (TID) | ORAL | Status: DC

## 2020-10-01 NOTE — Progress Notes (Signed)
2 Days Post-Op  Subjective: Weaning pressors, on epi at 4. Also weaning sedation. Patient continues to have fevers. Creatinine downtrending. Good UOP.  Objective: Vital signs in last 24 hours: Temp:  [100.2 F (37.9 C)-102.02 F (38.9 C)] 101.5 F (38.6 C) (03/23 0630) Pulse Rate:  [76-84] 78 (03/23 0630) Resp:  [28-31] 30 (03/23 0630) BP: (109-124)/(48-60) 118/54 (03/23 0600) SpO2:  [98 %-100 %] 98 % (03/23 0630) Arterial Line BP: (84-126)/(47-58) 106/51 (03/23 0630) FiO2 (%):  [50 %] 50 % (03/23 0334) Last BM Date:  (pta)  Intake/Output from previous day: 03/22 0701 - 03/23 0700 In: 3767.8 [I.V.:3361.9; IV Piggyback:405.9] Out: 3405 [Urine:2175; Drains:1230] Intake/Output this shift: No intake/output data recorded.  PE: General: intubated, sedated. Anasarca. HEENT: OG and ETT in place. CV: RRR Resp: intubated, on vent Abdomen: Soft. Midline incision open at the skin, wound bed is clean and dry. RUQ biliary drain with bile. RUQ JPx2 with serosanguinous drainage. LUQ pancreatic stent with clear colorless fluid. G tube to gravity with minimal drainage. GU: foley in place, draining yellow urine  Lab Results:  Recent Labs    09/30/20 0403 10/01/20 0419  WBC 35.6* 24.8*  HGB 9.4* 8.2*  HCT 28.0* 24.8*  PLT 119* 122*   BMET Recent Labs    09/30/20 0736 10/01/20 0419  NA 139 140  K 4.4 4.1  CL 109 109  CO2 20* 23  GLUCOSE 250* 201*  BUN 75* 84*  CREATININE 3.13* 2.91*  CALCIUM 8.2* 8.6*   PT/INR Recent Labs    09/18/2020 1624  LABPROT 16.1*  INR 1.4*   CMP     Component Value Date/Time   NA 140 10/01/2020 0419   NA 142 07/21/2020 0921   K 4.1 10/01/2020 0419   CL 109 10/01/2020 0419   CO2 23 10/01/2020 0419   GLUCOSE 201 (H) 10/01/2020 0419   BUN 84 (H) 10/01/2020 0419   BUN 16 07/21/2020 0921   CREATININE 2.91 (H) 10/01/2020 0419   CREATININE 1.16 03/01/2016 1607   CALCIUM 8.6 (L) 10/01/2020 0419   PROT 4.6 (L) 10/01/2020 0419   PROT 6.7  07/21/2020 0921   ALBUMIN 1.4 (L) 10/01/2020 0419   ALBUMIN 4.1 07/21/2020 0921   AST 62 (H) 10/01/2020 0419   ALT 23 10/01/2020 0419   ALKPHOS 60 10/01/2020 0419   BILITOT 4.4 (H) 10/01/2020 0419   BILITOT 0.8 07/21/2020 0921   GFRNONAA 26 (L) 10/01/2020 0419   GFRNONAA >89 09/22/2015 1008   GFRAA 82 07/21/2020 0921   GFRAA >89 09/22/2015 1008   Lipase     Component Value Date/Time   LIPASE 23 08/22/2020 0318       Studies/Results: No results found.    Assessment/Plan 50 yo male with a neuroendocrine tumor of the head of the pancreas, POD9 s/p right hemicolectomy, end ileostomy, Whipple and patch angioplasty repair of SMV. Complicated by postoperative hemorrhagic shock with DIC with extensive small bowel necrosis. All of small bowel has been resected, with external drainage of the bile duct, pancreas and stomach. - Continue TPN - Zosyn - Supportive care for now - Wean sedation - Versed stopped early this morning, if patient does not wake up more with sedation off, will need to obtain head CT - Wound care: saline wet-to-dry dressings BID to midline incision and prior ostomy site - VTE: SQH - Dispo: ICU   LOS: 9 days    Michaelle Birks, MD [facility] Surgery General, Hepatobiliary and Pancreatic Surgery 10/01/20 8:35 AM

## 2020-10-01 NOTE — Progress Notes (Signed)
PHARMACY - TOTAL PARENTERAL NUTRITION CONSULT NOTE  Indication:  Expected prolonged ileus  Patient Measurements: Height: 6\' 2"  (188 cm) Weight: 95.3 kg (210 lb) IBW/kg (Calculated) : 82.2 TPN AdjBW (KG): 95.3 Body mass index is 26.96 kg/m.  Assessment:  83 YOM with history of mass adjacent to head of the pancreas in 2021.  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  Developed cholecystitis in Feb 2022 and had percutaneous cholecystectomy tube placement.  Presented this admission for neuroendocrine tumor resection with Whipple procedure, also had right total colectomy, colostomy and cholecystectomy on 09/26/2020. SMV was lacerated during procedure and repaired by bovine pericardial patch. Developed hemorrhagic shock with bile leak post-op and underwent placement of biliary T tube with abdominal washout on 3/15 PM. Abdomen remains open. Pharmacy consulted to manage TPN.  Glucose / Insulin: no hx DM, A1c 5.2% - hypoglycemic on 3/16 PM. CBGs previously controlled <180, now elevated 190-220 after receiving stress steroids (HC 100mg  IV q8h started 3/22 PM). Utilized 22 units SSI used in last 24h Electrolytes: CoCa high ~10.7 (iCa low at 1.13 on 3/22 - s/p Ca gluconate 1g IV x 1 per MD yesterday), Phos 1.7; Mag high 2.5; others WNL Renal: AKI, SCr down to 2.91 (BL ~1-1.2), BUN up to 84 Hepatic: AST mildly elevated - trend up, ALT WNL, tbili down to 4.4, TG back up to 249 (remains off propofol). Albumin 1.4, BL prealbumin <5 Intake / Output; MIVF: UOP 1 ml/kg/hr, drain output 1585 ml/24hrs, 50 ml NGT output/24hrs, EBL ~12L on 3/15-3/16; net +11.2L this admit (MIVF d/c'd 3/22; concentrate TPN per CCM) GI Imaging: none since TPN start GI Surgeries / Procedures: 3/14 Whipple procedure with en-bloc right hemicolectomy and end ileostomy, cholecystectomy 3/15 OR for washout, VAC placement 3/17 OR for washout, VAC replacement.  Unlikely to close abdomen d/t edema - necrosis of the  entire small bowel except for the PB limb and proximal 40-50cm of jejunum 3/19 OR for re-opening laparotomy, abdominal closure, found to have necrotic SB and deemed not survivable condition 3/21 OR for SB resection, take down of choledochojejunal anastomosis and pancreaticojejunal anastomosis, take down of duodenojejunal anastomosis, placement of externalized biliary drain / Stamm gastrostomy tube, intraabdominal washout and placement of intraperitoneal drains  Central access: CVC double lumen placed 09/20/2020 TPN start date: 09/30/2020  Nutritional Goals (RD rec on 3/18): 2500-2900 kCal, 165-190g AA, fluid >2L/day  Goal TPN is 90 ml/hr with 24g/L SMOF lipids (~20% of total kCal) - will provide 170g AA, 400g CHO, and 52g SMOF lipids, for total 2559 kCal per day  Current Nutrition:  TPN; NPO  Plan:  -Continue concentrated TPN at goal rate of 90 ml/hr. Will cautiously add back lipids today given severity of illness and TG stable in 200s - watch trend closely -Electrolytes in TPN: Na 25 mEq/L, K 61mEq/L, Ca 42mEq/L (may need to reduce but Ca just replaced - watch trend), decrease Mag to 54mEq/L, add back Phos 3 mmol/L, max acetate -Give NaPhos 47mmol IV x 1 -Add MVI. Remove standard trace elements and add back zinc 5mg , chromium 26mcg, selenium 47mcg (with previous Tbili elevated >6 and scleral icterus noted - consider adding back full trace elements if normalizes) -Continue moderate SSI Q4H + add regular insulin 10 units to TPN bag (watch CBGs closely on stress steroids) -F/U TPN labs, GOC   Arturo Morton, PharmD, BCPS Please check AMION for all [place] contact numbers Clinical Pharmacist 10/01/2020 7:44 AM

## 2020-10-01 NOTE — Progress Notes (Signed)
Daily Progress Note   Patient Name: [name]       Date: 10/01/2020 DOB: Nov 09, 1970  Age: 50 y.o. MRN#: [id number] Attending Physician: [name], MD Primary Care Physician: Gildardo Pounds, NP Admit Date: 09/14/2020  Reason for Consultation/Follow-up: Establishing goals of care  Subjective: Patient s/p further resection small bowel 3/21, with placement of external biliary drain, g-tube, and intraperitoneal drains. Pressor requirement improving a little. Urine output is poor.  Discussed case with multiple care team members- patient has very poor prognosis. [facility] is not accepting him at this time for small bowel transplant- he would need to be stable before they would consider accepting him.  No family at bedside.   Review of Systems  Unable to perform ROS: Intubated    Length of Stay: 9  Current Medications: Scheduled Meds:  . sodium chloride   Intravenous Once  . chlorhexidine gluconate (MEDLINE KIT)  15 mL Mouth Rinse BID  . Chlorhexidine Gluconate Cloth  6 each Topical Daily  . heparin injection (subcutaneous)  5,000 Units Subcutaneous Q8H  . hydrocortisone sod succinate (SOLU-CORTEF) inj  100 mg Intravenous Q8H  . insulin aspart  0-15 Units Subcutaneous Q4H  . mouth rinse  15 mL Mouth Rinse 10 times per day  . pantoprazole (PROTONIX) IV  40 mg Intravenous BID    Continuous Infusions: . sodium chloride    . sodium chloride    . anidulafungin    . epinephrine 4 mcg/min (10/01/20 0912)  . HYDROmorphone 1 mg/hr (10/01/20 0800)  . lactated ringers Stopped (09/15/2020 0706)  . midazolam Stopped (10/01/20 0500)  . piperacillin-tazobactam (ZOSYN)  IV 12.5 mL/hr at 10/01/20 0800  . sodium phosphate  Dextrose 5% IVPB 30 mmol (10/01/20 0858)  . TPN ADULT (ION) 90 mL/hr  at 10/01/20 0800  . vasopressin Stopped (10/01/20 0725)    PRN Meds: fentaNYL (SUBLIMAZE) injection, fentaNYL (SUBLIMAZE) injection, midazolam, midazolam, ondansetron (ZOFRAN) IV, polyvinyl alcohol  Physical Exam Vitals and nursing note reviewed.  Constitutional:      Appearance: He is ill-appearing.  Cardiovascular:     Comments: Lower extremity edema Abdominal:     Comments: Multiple drains  Neurological:     Comments: sedated             Vital Signs: BP (!) 110/50   Pulse 81  Temp (!) 101.3 F (38.5 C)   Resp (!) 30   Ht _0  (1.88 m)   Wt 95.3 kg   SpO2 100%   BMI 26.96 kg/m  SpO2: SpO2: 100 % O2 Device: O2 Device: Ventilator O2 Flow Rate: O2 Flow Rate (L/min): 1 L/min  Intake/output summary:   Intake/Output Summary (Last 24 hours) at 10/01/2020 0919 Last data filed at 10/01/2020 0800 Gross per 24 hour  Intake 3575.34 ml  Output 3710 ml  Net -134.66 ml   LBM: Last BM Date:  (pta) Baseline Weight: Weight: 95.3 kg Most recent weight: Weight: 95.3 kg       Palliative Assessment/Data: PPS: 10%     Patient Active Problem List   Diagnosis Date Noted  . Endotracheally intubated   . Acute respiratory failure with hypoxia ([place])   . Small bowel ischemia ([place])   . Shock circulatory ([place])   . Neuroendocrine neoplasm of gastrointestinal tract 09/16/2020  . Pancreas cancer ([place]) 09/26/2020  . Neuroendocrine tumor 09/12/2020  . Calculus of gallbladder with acute cholecystitis 08/26/2020  . Acute cholecystitis 08/26/2020  . Symptomatic cholelithiasis 08/21/2020  . Gout 07/03/2020  . Cholelithiasis 05/27/2020  . Abdominal mass 05/25/2020  . Dermatitis 02/10/2017  . Nephrolithiasis 02/10/2017  . HTN (hypertension) 09/22/2015  . Constipation 09/22/2015  . Asthma, intermittent 02/04/2014  . Ventricular fibrillation ([place]) 11/09/2013  . Environmental allergies 10/11/2013  . IFG (impaired fasting glucose) 10/11/2013  . Hyperlipidemia 10/11/2013  . Chronic  systolic heart failure ([place]) 05/26/2010  . Automatic implantable cardioverter-defibrillator in situ 05/26/2010    Palliative Care Assessment & Plan   Patient Profile: 50 y.o.M with PMH of neuroendocrine tumor and underwent Whipple procedure 3/14 with intra-operative SMV laceration and repairby vascular with bovine pericardial patch. Pt had worsening shock overnight and required three pressors despite massive transfusion protocol (received 150 blood products). He was taken back to the OR for washout on 3/15 and returned to the ICU intubated. Taken back 3/17 again found to have small bowel necrosis s/p resection. Plans again to return 3/19 to OR. Palliative medicine consulted for goals of care.   Assessment/Recommendations/Plan  Patient and family are Muslim from [place]- cultural considerations for care must be taken into account- in this culture family and patients rely a great deal on recommendations made by medical providers- they will accept all offered interventions that will prolong a body- physical suffering is not a consideration in plan of care for this patient and family Recommend that providers set limits based on if options are realistic vs providing futile care  Goals of Care and Additional Recommendations: Limitations on Scope of Treatment: Full Scope Treatment  Code Status: Limited code  Prognosis:  Unable to determine  Discharge Planning: To Be Determined  Care plan was discussed with care team  Thank you for allowing the Palliative Medicine Team to assist in the care of this patient.   Total time- 38 mins Greater than 50%  of this time was spent counseling and coordinating care related to the above assessment and plan.  Mariana Kaufman, AGNP-C Palliative Medicine   Please contact Palliative Medicine Team phone at [phone] for questions and concerns.

## 2020-10-01 NOTE — Progress Notes (Signed)
[facility] Progress Note Patient Name: ARLEE SANTOSUOSSO DOB: 09-10-1970 MRN: [id number]   Date of Service  10/01/2020  HPI/Events of Note  Notified that patient is unresponsive. Was on Versed at 10 now titrated down to 2 Creatinine 3.1, WBC 35 yesterday started on anidulafungin  eICU Interventions  Hold Versed and resume only as needed Checked on blood cultures ordered last night and no result available. Bedside nurse to call lab to verify.     Intervention Category Major Interventions: Change in mental status - evaluation and management  Shona Needles Brigitte Soderberg 10/01/2020, 5:03 AM

## 2020-10-01 NOTE — Progress Notes (Signed)
eLink Physician-Brief Progress Note Patient Name: [name] DOB: 02/13/1971 MRN: [id number]   Date of Service  10/01/2020  HPI/Events of Note  Notified of phos 1.7 Creatinine 2.91  eICU Interventions  Ordered oral phos x doses     Intervention Category Major Interventions: Electrolyte abnormality - evaluation and management  Judd Lien 10/01/2020, 6:36 AM

## 2020-10-01 NOTE — Progress Notes (Addendum)
NAMEEDU ON, MRN:  [id number], DOB:  1971-07-11, LOS: 9 ADMISSION DATE:  09/30/2020, CONSULTATION DATE:  10/01/20 REFERRING MD:  Anesthesia, CHIEF COMPLAINT:  Whipple, post-op shock   Brief History:  50 y.o. M with PMH of neuroendocrine tumor and underwent Whipple procedure 3/14 with intra-operative SMV laceration and repair by vascular with bovine pericardial patch.  Pt had worsening shock overnight and required three pressors despite massive transfusion protocol (received 150 blood products).  He was taken back to the OR for washout on 3/15 and returned to the ICU intubated.  Taken back 3/17 again found to have small bowel necrosis s/p resection.  Plans again to return 3/19 to OR.   Past Medical History:   has a past medical history of AICD (automatic cardioverter/defibrillator) present, Dyslipidemia, Heart failure with mildly reduced EF, History of kidney stones, HTN (hypertension), ventricular fibrillation, and Nonischemic cardiomyopathy.   Significant Hospital Events:  3/14: Admit to Surgery, to OR for whipple, SMV injury and repair, massive transfusion protocol overnight  3/15: Back to OR for washout, x2. IR for arteriogram. No major bleed source identified in OR, or arterial bleed w IR. Robust product resuscitation >75 products (+ TXA + novoseven) 3/16: off pressors, add'l 39 products overnight. Slowing resuscitation this morning with hemodynamic improvements and slowing drain output; OR x 2 - washout, biliary t tube, wound vac -- washout, wound vac, IR for arteriogram -- no arterial bleed for embolization 3/17:  Aggressive balanced transfusion continue overnight with marked hemodynamic improvement since yesterday evening. Off pressors x several hours, Wound vac output significantly slowing (from 58ml q15-61min to 532ml q1hr+) and output is much thinner, Thin bloody secretions from mouth approx 228ml, Decreased RR from 22 to 18, Unable to tolerate NE- severe bradycardia 3/19  taken back to OR. Small bowel noted to be almost completely necrosed. Patient was closed with plans for goals of car discussion.  3/20 GOC family endorses full scope of treatment. Primary team discussed with [facility] the possibility of transfer for small bowel transplant. [facility] felt as though transfer would not change outcome. 3/21 Ongoing discussion with family and [facility]. Patient may be a candidate for transplant if he can stabilize post a small bowel resection. He was taken back to the OR and small bowel was resected.  3/23 weaning pressors   Consults:  PCCM VVS  IR  Palliative  Procedures:  3/15 ETT 3/15 R IJ cordis/ CVL >> 3/15 left aline >>  Significant Diagnostic Tests:  3/15 mesenteric arteriogram> no identified arterial source of bleed 3/18 Echo > LVEF 40-45%, Grade 1 DD  Micro Data:  3/15 MRSA>>negative  Antimicrobials:  Ancef 3/14 Flagyl 3/14 3/17 flagyl > 3/20 3/17 ceftriaxone > 3/18 vanc > 3/20  Zosyn 3/21 >  Interim History / Subjective:  Weaning pressors I shut off vaso this morning Low dose Epi   Versed was stopped. Still on dilaudid gtt   Objective   Blood pressure (!) 118/54, pulse 78, temperature (!) 101.5 F (38.6 C), resp. rate (!) 30, height 6\' 2"  (1.88 m), weight 95.3 kg, SpO2 98 %.    Vent Mode: PRVC FiO2 (%):  [50 %] 50 % Set Rate:  [30 bmp] 30 bmp Vt Set:  [580 mL] 580 mL PEEP:  [5 cmH20] 5 cmH20 Plateau Pressure:  [22 cmH20-26 cmH20] 23 cmH20   Intake/Output Summary (Last 24 hours) at 10/01/2020 0734 Last data filed at 10/01/2020 0600 Gross per 24 hour  Intake 3767.8 ml  Output 3405 ml  Net 362.8 ml   Filed Weights   09/16/2020 0639  Weight: 95.3 kg    Examination: General: Critically ill appearing middle aged m, sedated and intubated NAD  HEENT: NCAT ETT secure trachea midline  Neuro: Sedated. Does not respond to painful stimuli. PERRL very sluggish  CV: RR, occasional PVCs. Cap refill < 3 sec  PULM:  Mechanically ventilated.  Symmetrical chest expansion.  Scattered rhonchi  GI: Distended abdomen, midline incision dressing c/d/i. Serosang fluid in JPs, biliary looking drainage from Pancreatic and bile duct drains.  GU: Edematous testes, foley  Extremities: No acute joint abnormality. Anasarca. No cyanosis or clubbing  Skin: warm, c/d   Resolved Hospital Problem list   Hemorrhagic shock  Assessment & Plan:   Acute encephalopathy -sedating medications, critical illness P -versed off 3/23 -continues on dilaudid gtt-- balance between not oversedating and still managing pain from extensive surgeries / PAD   Acute respiratory failure with hypoxia requiring MV - Full ventilatory support. Not candidate for SBT at this time. - VAP bundle - ABG reviewed and vent adjusted.  - Hydromorphone gtt for RASS goal -2.    Neuroendocrine tumor s/p whipple and R hemicolectomy, SMV repair (3/14; c/b hemorrhagic shock and coagulopathy s/p washout and abthera x 2, biliary ttube (3/15) c/b bowel necrosis as below  - Closed 3/19.  Small bowel necrosis s/p total resection (3/17, 3/19, 3/22)  P - management per CCS - Monitor drain output.  - TPN   - Wound care -plan is supportive care to see if pt improves enough for transfer to [facility] for possible small bowel transplant   Distributive shock in setting of small bowl infarction  Entire small bowel is unsalvageable per Op note 3/19. He was taken back to OR 3/21 for resection.  P -vaso off -cont Epi for MAP > 65. (Has not tolerated NE in the past with multiple episodes of sev. Bradycardia) -solucortef started per primary  - Zosyn per pharmacy -BCx pending   NICM, compensated S/p ICD placement LVEF 45% P - hold home bisoprolol, entresto - -shock   AKI -Cr somewhat stable -- slight downtrend from 2 to 2.9 -significant volume overload-- Shock limits diuresis.  -is still making urine P - Trend renal indices, I/O  Hypophosphatemia -replace  Inadequate PO intake -TPN    Hyperglycemia -managed w SSI + TPN   GOC - Limited code. No shocks. CPR OK.  - Hoping to stabilize him for a transfer to [facility] for possible small bowel transplant  -prognosis  guarded  -Pal care following   Daily Goals Checklist  Pain/Anxiety/Delirium protocol (if indicated):  hydromorphine infusions VAP protocol (if indicated): bundle in place.  Respiratory support goals: Full ventilatory support Blood pressure target: Titrate epinephrine to keep MAP 65. DVT prophylaxis: SCDs Nutrition Status: On TPN GI prophylaxis: Pantoprazole Fluid status goals: Allow autoregulation Urinary catheter: Assessment of intravascular volume,  Central lines: Right IJ central line still needed Glucose control: - in TPN Mobility/therapy needs: Bedrest Restraints: Restraint type:NA Reason for restraints:NA Daily labs: CBC, CMP daily Code Status: Partial - -no CV/DF  Family Communication: Ongoing family discussions by primary team.  Last multidisciplinary [place] 3/19, detailed below Disposition: ICU.   Goals of Care:  Last date of multidisciplinary goals of care discussion: 3/19 Family and staff present: Dr. Lynetta Mare, Ricky Ala and multiple family members Summary of discussion: Family informed that patient has unsalvageable bowel necrosis with no rescue options.  Expectation is the patient will become increasingly septic over time.  Unable to resect bowel because insufficient bowel to create anastomoses.  Family requested second opinion.  Situation discussed with hepatobiliary [facility] at [facility]. Plan is try to stabilize, attempt transfer to [facility] for small bowel transplant consideration    Code Status:  Partial - no CV/DF   CRITICAL CARE Performed by: Cristal Generous   Total critical care time: 52 minutes  Critical care time was exclusive of separately billable procedures and treating other patients.  Critical care was necessary to treat or prevent imminent or life-threatening  deterioration.  Critical care was time spent personally by me on the following activities: development of treatment plan with patient and/or surrogate as well as nursing, discussions with consultants, evaluation of patient's response to treatment, examination of patient, obtaining history from patient or surrogate, ordering and performing treatments and interventions, ordering and review of laboratory studies, ordering and review of radiographic studies, pulse oximetry and re-evaluation of patient's condition.  [name] MSN, AGACNP-BC [place] [phone] If no answer, [phone] 10/01/2020, 7:34 AM

## 2020-10-01 NOTE — Progress Notes (Signed)
Spoke with pt wife, she had extensive questions regarding pt's survivability and other organ functions. Questions were answered as best as possible; reiterating what Dr.'s have already stated to the family: that without his small bowel his other organs will begin to fail leaving his prognosis as very poor. I encouraged the wife to speak with the Dr. again with any further questions for better clarity. Pt wife stated they would continue to pray for the pt.

## 2020-10-02 ENCOUNTER — Inpatient Hospital Stay: Payer: Self-pay

## 2020-10-02 DIAGNOSIS — J9601 Acute respiratory failure with hypoxia: Secondary | ICD-10-CM | POA: Diagnosis not present

## 2020-10-02 LAB — COMPREHENSIVE METABOLIC PANEL
ALT: 52 U/L — ABNORMAL HIGH (ref 0–44)
AST: 107 U/L — ABNORMAL HIGH (ref 15–41)
Albumin: 1.4 g/dL — ABNORMAL LOW (ref 3.5–5.0)
Alkaline Phosphatase: 60 U/L (ref 38–126)
Anion gap: 9 (ref 5–15)
BUN: 88 mg/dL — ABNORMAL HIGH (ref 6–20)
CO2: 23 mmol/L (ref 22–32)
Calcium: 8.8 mg/dL — ABNORMAL LOW (ref 8.9–10.3)
Chloride: 112 mmol/L — ABNORMAL HIGH (ref 98–111)
Creatinine, Ser: 2.53 mg/dL — ABNORMAL HIGH (ref 0.61–1.24)
GFR, Estimated: 30 mL/min — ABNORMAL LOW (ref >60)
Glucose, Bld: 261 mg/dL — ABNORMAL HIGH (ref 70–99)
Potassium: 3.4 mmol/L — ABNORMAL LOW (ref 3.5–5.1)
Sodium: 144 mmol/L (ref 135–145)
Total Bilirubin: 4.9 mg/dL — ABNORMAL HIGH (ref 0.3–1.2)
Total Protein: 5 g/dL — ABNORMAL LOW (ref 6.5–8.1)

## 2020-10-02 LAB — GLUCOSE, CAPILLARY
Glucose-Capillary: 210 mg/dL — ABNORMAL HIGH (ref 70–99)
Glucose-Capillary: 222 mg/dL — ABNORMAL HIGH (ref 70–99)
Glucose-Capillary: 222 mg/dL — ABNORMAL HIGH (ref 70–99)
Glucose-Capillary: 226 mg/dL — ABNORMAL HIGH (ref 70–99)
Glucose-Capillary: 236 mg/dL — ABNORMAL HIGH (ref 70–99)
Glucose-Capillary: 240 mg/dL — ABNORMAL HIGH (ref 70–99)
Glucose-Capillary: 250 mg/dL — ABNORMAL HIGH (ref 70–99)

## 2020-10-02 LAB — CBC WITH DIFFERENTIAL/PLATELET
Abs Immature Granulocytes: 0.2 10*3/uL — ABNORMAL HIGH (ref 0.00–0.07)
Basophils Absolute: 0 10*3/uL (ref 0.0–0.1)
Basophils Relative: 0 %
Eosinophils Absolute: 0 10*3/uL (ref 0.0–0.5)
Eosinophils Relative: 0 %
HCT: 26.7 % — ABNORMAL LOW (ref 39.0–52.0)
Hemoglobin: 8.5 g/dL — ABNORMAL LOW (ref 13.0–17.0)
Lymphocytes Relative: 3 %
Lymphs Abs: 0.6 10*3/uL — ABNORMAL LOW (ref 0.7–4.0)
MCH: 29.1 pg (ref 26.0–34.0)
MCHC: 31.8 g/dL (ref 30.0–36.0)
MCV: 91.4 fL (ref 80.0–100.0)
Monocytes Absolute: 1 10*3/uL (ref 0.1–1.0)
Monocytes Relative: 5 %
Myelocytes: 1 %
Neutro Abs: 18.2 10*3/uL — ABNORMAL HIGH (ref 1.7–7.7)
Neutrophils Relative %: 91 %
Platelets: 200 10*3/uL (ref 150–400)
RBC: 2.92 MIL/uL — ABNORMAL LOW (ref 4.22–5.81)
RDW: 19.1 % — ABNORMAL HIGH (ref 11.5–15.5)
WBC: 20 10*3/uL — ABNORMAL HIGH (ref 4.0–10.5)
nRBC: 2.7 % — ABNORMAL HIGH (ref 0.0–0.2)
nRBC: 6 /100 WBC — ABNORMAL HIGH

## 2020-10-02 LAB — CBC
HCT: 25.9 % — ABNORMAL LOW (ref 39.0–52.0)
Hemoglobin: 8.4 g/dL — ABNORMAL LOW (ref 13.0–17.0)
MCH: 29.4 pg (ref 26.0–34.0)
MCHC: 32.4 g/dL (ref 30.0–36.0)
MCV: 90.6 fL (ref 80.0–100.0)
Platelets: 181 10*3/uL (ref 150–400)
RBC: 2.86 MIL/uL — ABNORMAL LOW (ref 4.22–5.81)
RDW: 19.2 % — ABNORMAL HIGH (ref 11.5–15.5)
WBC: 17.5 10*3/uL — ABNORMAL HIGH (ref 4.0–10.5)
nRBC: 3 % — ABNORMAL HIGH (ref 0.0–0.2)

## 2020-10-02 LAB — PHOSPHORUS: Phosphorus: 2.5 mg/dL (ref 2.5–4.6)

## 2020-10-02 LAB — SURGICAL PATHOLOGY

## 2020-10-02 LAB — TRAUMA TEG PANEL
CFF Max Amplitude: 64.3 mm — ABNORMAL HIGH (ref 15–32)
Citrated Kaolin (R): 6.9 min (ref 4.6–9.1)
Citrated Rapid TEG (MA): 67.7 mm (ref 52–70)
Lysis at 30 Minutes: 0 % (ref 0.0–2.6)

## 2020-10-02 LAB — MAGNESIUM: Magnesium: 2.6 mg/dL — ABNORMAL HIGH (ref 1.7–2.4)

## 2020-10-02 MED ORDER — ZINC CHLORIDE 1 MG/ML IV SOLN
INTRAVENOUS | Status: AC
  Filled 2020-10-02: qty 1137.6

## 2020-10-02 MED ORDER — FENTANYL CITRATE (PF) 100 MCG/2ML IJ SOLN: 50.0000 ug | INTRAMUSCULAR | Status: DC | PRN

## 2020-10-02 MED ORDER — INSULIN ASPART 100 UNIT/ML ~~LOC~~ SOLN
0.0000 [IU] | SUBCUTANEOUS | Status: DC
  Administered 2020-10-02 (×4): 7 [IU] via SUBCUTANEOUS
  Administered 2020-10-03: 4 [IU] via SUBCUTANEOUS
  Administered 2020-10-03: 3 [IU] via SUBCUTANEOUS
  Administered 2020-10-03 (×3): 7 [IU] via SUBCUTANEOUS
  Administered 2020-10-04: 4 [IU] via SUBCUTANEOUS
  Administered 2020-10-04: 7 [IU] via SUBCUTANEOUS
  Administered 2020-10-04 (×2): 11 [IU] via SUBCUTANEOUS
  Administered 2020-10-04 – 2020-10-05 (×5): 7 [IU] via SUBCUTANEOUS
  Administered 2020-10-05: 4 [IU] via SUBCUTANEOUS
  Administered 2020-10-05 (×2): 7 [IU] via SUBCUTANEOUS
  Administered 2020-10-06: 4 [IU] via SUBCUTANEOUS
  Administered 2020-10-06: 7 [IU] via SUBCUTANEOUS
  Administered 2020-10-06 (×2): 4 [IU] via SUBCUTANEOUS
  Administered 2020-10-06: 7 [IU] via SUBCUTANEOUS
  Administered 2020-10-06 – 2020-10-07 (×5): 4 [IU] via SUBCUTANEOUS
  Administered 2020-10-07: 7 [IU] via SUBCUTANEOUS
  Administered 2020-10-07: 4 [IU] via SUBCUTANEOUS
  Administered 2020-10-07: 3 [IU] via SUBCUTANEOUS
  Administered 2020-10-08 – 2020-10-09 (×8): 4 [IU] via SUBCUTANEOUS
  Administered 2020-10-09 (×3): 7 [IU] via SUBCUTANEOUS
  Administered 2020-10-09 – 2020-10-10 (×2): 4 [IU] via SUBCUTANEOUS
  Administered 2020-10-10 (×2): 7 [IU] via SUBCUTANEOUS
  Administered 2020-10-10: 4 [IU] via SUBCUTANEOUS
  Administered 2020-10-10: 7 [IU] via SUBCUTANEOUS
  Administered 2020-10-10 – 2020-10-11 (×2): 4 [IU] via SUBCUTANEOUS
  Administered 2020-10-11: 7 [IU] via SUBCUTANEOUS
  Administered 2020-10-11 – 2020-10-14 (×17): 4 [IU] via SUBCUTANEOUS
  Administered 2020-10-14 (×2): 7 [IU] via SUBCUTANEOUS
  Administered 2020-10-14 (×2): 4 [IU] via SUBCUTANEOUS
  Administered 2020-10-15: 7 [IU] via SUBCUTANEOUS
  Administered 2020-10-15: 11 [IU] via SUBCUTANEOUS
  Administered 2020-10-15: 7 [IU] via SUBCUTANEOUS
  Administered 2020-10-15: 4 [IU] via SUBCUTANEOUS
  Administered 2020-10-15: 7 [IU] via SUBCUTANEOUS
  Administered 2020-10-15: 11 [IU] via SUBCUTANEOUS
  Administered 2020-10-16: 7 [IU] via SUBCUTANEOUS
  Administered 2020-10-16 (×3): 4 [IU] via SUBCUTANEOUS
  Administered 2020-10-17 (×2): 7 [IU] via SUBCUTANEOUS
  Administered 2020-10-17: 11 [IU] via SUBCUTANEOUS
  Administered 2020-10-17: 4 [IU] via SUBCUTANEOUS
  Administered 2020-10-17: 11 [IU] via SUBCUTANEOUS
  Administered 2020-10-18 (×2): 4 [IU] via SUBCUTANEOUS
  Administered 2020-10-18: 7 [IU] via SUBCUTANEOUS
  Administered 2020-10-18: 6 [IU] via SUBCUTANEOUS
  Administered 2020-10-18 – 2020-10-19 (×6): 4 [IU] via SUBCUTANEOUS
  Administered 2020-10-19: 3 [IU] via SUBCUTANEOUS
  Administered 2020-10-19 – 2020-10-20 (×5): 4 [IU] via SUBCUTANEOUS
  Administered 2020-10-20: 3 [IU] via SUBCUTANEOUS
  Administered 2020-10-20: 4 [IU] via SUBCUTANEOUS
  Administered 2020-10-21: 7 [IU] via SUBCUTANEOUS
  Administered 2020-10-21: 4 [IU] via SUBCUTANEOUS
  Administered 2020-10-21: 7 [IU] via SUBCUTANEOUS
  Administered 2020-10-21 – 2020-10-22 (×4): 4 [IU] via SUBCUTANEOUS
  Administered 2020-10-22: 7 [IU] via SUBCUTANEOUS
  Administered 2020-10-22 – 2020-10-23 (×8): 4 [IU] via SUBCUTANEOUS
  Administered 2020-10-23 (×2): 7 [IU] via SUBCUTANEOUS
  Administered 2020-10-23: 4 [IU] via SUBCUTANEOUS
  Administered 2020-10-24: 3 [IU] via SUBCUTANEOUS
  Administered 2020-10-24: 4 [IU] via SUBCUTANEOUS
  Administered 2020-10-24: 3 [IU] via SUBCUTANEOUS
  Administered 2020-10-24 (×2): 4 [IU] via SUBCUTANEOUS
  Administered 2020-10-25: 3 [IU] via SUBCUTANEOUS
  Administered 2020-10-25 (×3): 4 [IU] via SUBCUTANEOUS
  Administered 2020-10-25 – 2020-10-26 (×3): 3 [IU] via SUBCUTANEOUS
  Administered 2020-10-26: 4 [IU] via SUBCUTANEOUS
  Administered 2020-10-26 (×3): 3 [IU] via SUBCUTANEOUS
  Administered 2020-10-26: 2 [IU] via SUBCUTANEOUS
  Administered 2020-10-26 – 2020-10-27 (×6): 3 [IU] via SUBCUTANEOUS
  Administered 2020-10-27: 4 [IU] via SUBCUTANEOUS
  Administered 2020-10-28 (×4): 3 [IU] via SUBCUTANEOUS
  Administered 2020-10-28: 4 [IU] via SUBCUTANEOUS
  Administered 2020-10-29 (×2): 3 [IU] via SUBCUTANEOUS
  Administered 2020-10-29: 4 [IU] via SUBCUTANEOUS
  Administered 2020-10-29 (×3): 3 [IU] via SUBCUTANEOUS
  Administered 2020-10-30 (×2): 4 [IU] via SUBCUTANEOUS
  Administered 2020-10-30: 3 [IU] via SUBCUTANEOUS
  Administered 2020-10-30: 4 [IU] via SUBCUTANEOUS
  Administered 2020-10-31 – 2020-11-06 (×32): 3 [IU] via SUBCUTANEOUS

## 2020-10-02 MED ORDER — POLYETHYLENE GLYCOL 3350 17 G PO PACK
17.0000 g | PACK | Freq: Every day | ORAL | Status: DC
  Filled 2020-10-02: qty 1

## 2020-10-02 MED ORDER — HYDROCORTISONE NA SUCCINATE PF 100 MG IJ SOLR
50.0000 mg | Freq: Two times a day (BID) | INTRAMUSCULAR | Status: DC
  Administered 2020-10-02 – 2020-10-04 (×4): 50 mg via INTRAVENOUS
  Filled 2020-10-02 (×4): qty 2

## 2020-10-02 MED ORDER — NOREPINEPHRINE 4 MG/250ML-% IV SOLN
0.0000 ug/min | INTRAVENOUS | Status: DC
  Administered 2020-10-02: 1 ug/min via INTRAVENOUS
  Administered 2020-10-02: 12 ug/min via INTRAVENOUS
  Administered 2020-10-03: 6 ug/min via INTRAVENOUS
  Filled 2020-10-02 (×3): qty 250

## 2020-10-02 MED ORDER — DEXMEDETOMIDINE HCL IN NACL 400 MCG/100ML IV SOLN
0.0000 ug/kg/h | INTRAVENOUS | Status: DC
  Administered 2020-10-02: 0.4 ug/kg/h via INTRAVENOUS
  Filled 2020-10-02: qty 100

## 2020-10-02 MED ORDER — DOCUSATE SODIUM 50 MG/5ML PO LIQD
100.0000 mg | Freq: Two times a day (BID) | ORAL | Status: DC
  Filled 2020-10-02: qty 10

## 2020-10-02 MED ORDER — POTASSIUM PHOSPHATES 15 MMOLE/5ML IV SOLN
20.0000 mmol | Freq: Once | INTRAVENOUS | Status: AC
  Administered 2020-10-02: 20 mmol via INTRAVENOUS
  Filled 2020-10-02: qty 6.67

## 2020-10-02 MED ORDER — FENTANYL CITRATE (PF) 100 MCG/2ML IJ SOLN
50.0000 ug | INTRAMUSCULAR | Status: DC | PRN
Start: 2020-10-02 — End: 2020-10-04

## 2020-10-02 MED ORDER — POTASSIUM CHLORIDE 10 MEQ/50ML IV SOLN
10.0000 meq | INTRAVENOUS | Status: AC
  Administered 2020-10-02 (×4): 10 meq via INTRAVENOUS
  Filled 2020-10-02 (×4): qty 50

## 2020-10-02 MED ORDER — HYDROCORTISONE NA SUCCINATE PF 100 MG IJ SOLR: 50.0000 mg | Freq: Three times a day (TID) | INTRAMUSCULAR | Status: DC

## 2020-10-02 NOTE — Progress Notes (Signed)
PHARMACY - TOTAL PARENTERAL NUTRITION CONSULT NOTE  Indication:  Expected prolonged ileus  Patient Measurements: Height: 6\' 2"  (188 cm) Weight: 124.9 kg (275 lb 5.7 oz) IBW/kg (Calculated) : 82.2 TPN AdjBW (KG): 95.3 Body mass index is 35.35 kg/m.  Assessment:  48 YOM with history of mass adjacent to head of the pancreas in 2021.  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  Developed cholecystitis in Feb 2022 and had percutaneous cholecystectomy tube placement.  Presented this admission for neuroendocrine tumor resection with Whipple procedure, also had right total colectomy, colostomy and cholecystectomy on 10/08/2020. SMV was lacerated during procedure and repaired by bovine pericardial patch. Developed hemorrhagic shock with bile leak post-op and underwent placement of biliary T tube with abdominal washout on 3/15 PM. Abdomen remains open. Pharmacy consulted to manage TPN.  Glucose / Insulin: no hx DM, A1c 5.2%  CBGs previously controlled <180, now elevated 222-261 after receiving stress steroids (HC 100mg  IV q8h started 3/22 PM). Utilized 28 units SSI used in last 24h + 10 units in the TPN bag Electrolytes: CoCa high ~10.7 (iCa low at 1.13 on 3/22 - s/p Ca gluconate 1g IV x 1 per MD yesterday), K 3.4 - will replace, Phos 2.5; Mag high 2.6; others WNL Renal: AKI, SCr down to 2.91>2.53 (BL ~1-1.2), BUN up to 84>88 Hepatic: AST mildly elevated - trend up, ALT WNL, tbili  4.9, TG back up to 249 (remains off propofol). Albumin 1.4, BL prealbumin <5 Intake / Output; MIVF: UOP 1.2 ml/kg/hr, drain output 1115 ml/24hrs, EBL ~12L on 3/15-3/16; net +11.2L this admit (MIVF d/c'd 3/22; concentrate TPN per CCM) GI Imaging: none since TPN start GI Surgeries / Procedures: 3/14 Whipple procedure with en-bloc right hemicolectomy and end ileostomy, cholecystectomy 3/15 OR for washout, VAC placement 3/17 OR for washout, VAC replacement.  Unlikely to close abdomen d/t edema - necrosis  of the entire small bowel except for the PB limb and proximal 40-50cm of jejunum 3/19 OR for re-opening laparotomy, abdominal closure, found to have necrotic SB and deemed not survivable condition 3/21 OR for SB resection, take down of choledochojejunal anastomosis and pancreaticojejunal anastomosis, take down of duodenojejunal anastomosis, placement of externalized biliary drain / Stamm gastrostomy tube, intraabdominal washout and placement of intraperitoneal drains  Central access: CVC double lumen placed 09/14/2020 TPN start date: 10/09/2020  Nutritional Goals (RD rec on 3/18): 2500-2900 kCal, 165-190g AA, fluid >2L/day  Goal TPN is 90 ml/hr with 24g/L SMOF lipids (~20% of total kCal) - will provide 170g AA, 400g CHO, and 52g SMOF lipids, for total 2559 kCal per day  Current Nutrition:  TPN; NPO  Plan:  -Continue concentrated TPN at goal rate of 90 ml/hr. Will cautiously add back lipids today given severity of illness and TG stable in 200s - watch trend closely -Electrolytes in TPN: Na 25 mEq/L, incr K 70mEq/L, decr Ca 20mEq/L, decr Mag to 82mEq/L, add back Phos 3 mmol/L, max acetate -Give KCL 10 meq IV x 4 -Add MVI. Remove standard trace elements and add back zinc 5mg , chromium 50mcg, selenium 34mcg (with previous Tbili elevated >6 and scleral icterus noted - consider adding back full trace elements if normalizes) -Continue moderate SSI Q4H + incr regular insulin 20 units to TPN bag (watch CBGs closely on stress steroids) -F/U TPN labs, GOC   Alanda Slim, PharmD, [facility] Clinical Pharmacist Please see AMION for all Pharmacists' Contact Phone Numbers 10/02/2020, 7:13 AM

## 2020-10-02 NOTE — Progress Notes (Signed)
[name], MRN:  [id number], DOB:  09/29/1970, LOS: 32 ADMISSION DATE:  09/15/2020, CONSULTATION DATE:  10/02/20 REFERRING MD:  Anesthesia, CHIEF COMPLAINT:  Whipple, post-op shock   Brief History:  50 y.o. M with PMH of neuroendocrine tumor and underwent Whipple procedure 3/14 with intra-operative SMV laceration and repair by vascular with bovine pericardial patch.  Pt had worsening shock overnight and required three pressors despite massive transfusion protocol (received 150 blood products).  He was taken back to the OR for washout on 3/15 and returned to the ICU intubated.  Taken back 3/17 again found to have small bowel necrosis s/p resection.  Plans again to return 3/19 to OR.   Totality of small bowel has been resected. The case was discussed with [facility] who may consider eval for sm intestinal transplant pending clinical course.  Continuing aggressive supportive care.   Past Medical History:   has a past medical history of AICD (automatic cardioverter/defibrillator) present, Dyslipidemia, Heart failure with mildly reduced EF, History of kidney stones, HTN (hypertension), ventricular fibrillation, and Nonischemic cardiomyopathy.   Significant Hospital Events:  3/14: Admit to Surgery, to OR for whipple, SMV injury and repair, massive transfusion protocol overnight  3/15: Back to OR for washout, x2. IR for arteriogram. No major bleed source identified in OR, or arterial bleed w IR. Robust product resuscitation >75 products (+ TXA + novoseven) 3/16: off pressors, add'l 39 products overnight. Slowing resuscitation this morning with hemodynamic improvements and slowing drain output; OR x 2 - washout, biliary t tube, wound vac -- washout, wound vac, IR for arteriogram -- no arterial bleed for embolization 3/17:  Aggressive balanced transfusion continue overnight with marked hemodynamic improvement since yesterday evening. Off pressors x several hours, Wound vac output significantly  slowing (from 536ml q15-30min to 538ml q1hr+) and output is much thinner, Thin bloody secretions from mouth approx 229ml, Decreased RR from 22 to 18, Unable to tolerate NE- severe bradycardia 3/19 taken back to OR. Small bowel noted to be almost completely necrosed. Patient was closed with plans for goals of car discussion.  3/20 GOC family endorses full scope of treatment. Primary team discussed with [facility] the possibility of transfer for small bowel transplant. [facility] felt as though transfer would not change outcome. 3/21 Ongoing discussion with family and [facility]. Patient may be a candidate for transplant if he can stabilize post a small bowel resection. He was taken back to the OR and small bowel was resected.  3/23 weaning pressors. Versed off. Remains encephalopathic 3/24 off pressors. Low dose dilaudid gtt -- only weakly grimaces to pain. Changing sedation to precedex + PRN fentanyl. Long discussion with 2 brothers regarding clinical case -- tried to clarify that while the term "stable" has been used, in this instance is meaning that he has not declined from previous shift but is in fact still critically ill with multisystem organ dysfunction.   Consults:  PCCM VVS  IR  Palliative  Procedures:  3/15 ETT 3/15 R IJ cordis/ CVL >> 3/15 left aline >>  Significant Diagnostic Tests:  3/15 mesenteric arteriogram> no identified arterial source of bleed 3/18 Echo > LVEF 40-45%, Grade 1 DD  Micro Data:  3/15 MRSA>>negative 3/23 BCx>> (no growth < 24 hrs)   Antimicrobials:  Ancef 3/14 Flagyl 3/14 3/17 flagyl > 3/20 3/17 ceftriaxone > 3/18 vanc > 3/20  Zosyn 3/21 > Eraxis 3/23>   Interim History / Subjective:  Off Epi -- SBPs low 100s  On dilaudid gtt  Making  about 100ml/kg/hr urine. Cr has downtrended a bit-- still elevated  BCx have not resulted. CBC hasnt returned -- afebrile overnight   Spoke with 2 brothers at bedside regarding persistent critical illness. Acknowledged small  improvements with certain metrics but tried to ensure understanding of the the broader critical illness   Objective   Blood pressure 119/62, pulse 79, temperature 98.78 F (37.1 C), resp. rate (!) 30, height 6\' 2"  (1.88 m), weight 124.9 kg, SpO2 100 %.    Vent Mode: PRVC FiO2 (%):  [40 %-50 %] 40 % Set Rate:  [30 bmp] 30 bmp Vt Set:  [580 mL] 580 mL PEEP:  [5 cmH20] 5 cmH20 Plateau Pressure:  [22 cmH20-24 cmH20] 23 cmH20   Intake/Output Summary (Last 24 hours) at 10/02/2020 0819 Last data filed at 10/02/2020 0500 Gross per 24 hour  Intake 2099.44 ml  Output 4310 ml  Net -2210.56 ml   Filed Weights   09/14/2020 0639 10/01/20 1503  Weight: 95.3 kg 124.9 kg    Examination: General:  Critically ill appearing middle aged M intubated and sedated, NAD  HEENT: NCAT. Mild scleral icterus. ETT secure. RIJ CVC.  Neuro: On low-dose dilaudid gtt. Faint grimace to noxious stimuli. Pupils 13mm sluggish. Does not open eyes to stimulation or follow command  CV: RR occasional PVCs on monitor. Cap refill < 3seconds.  PULM:  Symmetrical chest expansion, mechanically ventilated Diminished bibasilar sounds  GI: soft distended abdomen. Midline incision dressing c/d/i. 2x JPs with serosang fluid. Biliary appearing fluid in pancreatic and bile duct drains.   GU: Marked testicular edema, engulfing penis. Foley. Dark straw colored urine  Extremities: Anasarca. No acute joint deformity. No cyanosis or clubbing Skin: c/d/w  Resolved Hospital Problem list   Hemorrhagic shock  Assessment & Plan:   Acute encephalopathy -- suspect metabolic encephalopathy  -sedating medications, critical illness, multisystem organ dysfunction -with AKI and totality of sedation this admission, anticipate this will take some time to clear. Exam 3/24 (on just dilaudid) is better than 3/23 (dilaudid + versed) as pt now grimaces to pain P -RASS goal -1 to -2 -versed dc 3/23. Dc dilaudid 3/24 and start precedex gtt + PRN fent  -If  not improving, CT H   Acute respiratory failure with hypoxia requiring intubation  P -has bee intubated since 3/15 -- need to consider a trach, it does not seem like the family has intentions to deviate from aggressive care & his vent settings are now low enough that he may safely tolerate this.  -Full MV support, not a candidate for SBT at present -VAP -PAD as above  -will get CXR 3/25 AM  Neuroendocrine tumor s/p whipple and R hemicolectomy, SMV repair(3/14 whipple -- c/b hemorrhagic shock and coagulopathy s/p washout and abthera x 2, biliary ttube 3/15-- c/b bowel necrosis requiring multiple returns to OR as below  - Closed 3/19.  Small bowel necrosis s/p total  resection (3/17, 3/19, 3/22)  P - management per CCS - TPN  (currently going through R IJ CVC)  - Wound care - monitor outpt from drains - The current plan is to continue supportive care in effort to stabilize enough for eval for small intestinal transplant   Distributive shock/suspected septic shock in setting of small bowl infarction-- improving  Entire small bowel is unsalvageable per Op note 3/19. He was taken back to OR 3/21 for resection.  P - off pressors 3/24 AM with soft but acceptable Bps -Will leave Epi on MAR -- restart if needed for  MAP > 65 (did not tolerate NE in past with multiple episodes of reflexive bradycardia)  - Zosyn, eraxis - BCx sent 3/23 -- follow   NICM, compensated, s/p ICD placement  LVEF 45% P - cont to hold home bisoprolol, entresto - -shock   AKI, non-oliguric -Cr with interval downtrend.  -significant volume overload-- Shock limits diuresis.  -is still making urine Testicular edema, anasarca  P - Trend renal indices, I/O - maintain foley   Hypophosphatemia -replace  Malnutrition -total small bowel resection. Incr metabolic demand in setting of critical illness, surgeries  P -TPN   Hyperglycemia  -SSI   L/T/D -R IJ CVC placed 3/15 -- needs central access for TPN -- would  prefer PICC but pt with AKI (though improving).  -arterial line-- consider dc 3/24  If able to stay off epi  -Foley - need to continue due to extreme edema  -ETT placed 3/15 -- will need a trach  -PIVs- continue   [place] - Limited code. No shocks. CPR OK.   - Hoping to stabilize him for a transfer to [facility] for possible small bowel transplant evaluation -Family maintains hope for "total recovery" -- I (grace CCM NP) spoke with brothers at length 3/24 regarding clinical picture.   -prognosis very guarded. Is making some clinical improvements but underlying problem has a generally poor prognosis-- which this case is further compounded by additional acute problems.    Daily Goals Checklist  Pain/Anxiety/Delirium protocol (if indicated):  3/24-- changing to precedex + PRN fent  VAP protocol (if indicated): bundle in place.  DVT prophylaxis: SCDs Nutrition Status: On TPN GI prophylaxis: Pantoprazole Urinary catheter: Still needed 3/24 Central lines: Right IJ central line still needed -- hope toe xchange for PICC when able  Glucose control: -SSI  Mobility/therapy needs: Bedrest Restraints: Restraint type:NA Reason for restraints:NA Daily labs: CBC, CMP daily Code Status: Partial - -no CV/DF  Family Communication: Ongoing family discussions by primary team.  Last multidisciplinary [place] 3/19, detailed below Brothers updated at bedside 3/24 by CCM  Disposition: ICU.   Goals of Care:  Last date of multidisciplinary goals of care discussion: 3/19 Family and staff present: Dr. Lynetta Mare, Ricky Ala and multiple family members Summary of discussion: Family wishes for aggressive care. The target is to stabilize and possibly transfer to [facility] for small intestine transplant evaluation  Code Status:  Partial - no CV/DF   CRITICAL CARE Performed by: Cristal Generous   Total critical care time: 62 minutes  Critical care time was exclusive of separately billable procedures and treating  other patients.  Critical care was necessary to treat or prevent imminent or life-threatening deterioration.  Critical care was time spent personally by me on the following activities: development of treatment plan with patient and/or surrogate as well as nursing, discussions with consultants, evaluation of patient's response to treatment, examination of patient, obtaining history from patient or surrogate, ordering and performing treatments and interventions, ordering and review of laboratory studies, ordering and review of radiographic studies, pulse oximetry and re-evaluation of patient's condition.   [name] MSN, AGACNP-BC [place] [phone] If no answer, [phone] 10/02/2020, 9:53 AM

## 2020-10-02 NOTE — Progress Notes (Signed)
Nutrition Follow-up  DOCUMENTATION CODES:   Not applicable  INTERVENTION:   TNA to meet nutrition needs  Pt at high risk of malnutrition due to catabolic state.  Multiple drains in place with > 1 L output  NUTRITION DIAGNOSIS:   Increased nutrient needs related to post-op healing as evidenced by estimated needs. Ongoing.   GOAL:   Patient will meet greater than or equal to 90% of their needs Met on TNA.   MONITOR:   Vent status,Labs  REASON FOR ASSESSMENT:   Ventilator    ASSESSMENT:   Pt with a PMH of HTN, HLD, HF with reduced EF, AICD and dx mass adjacent to head of the pancreas in 2021.  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  Developed cholecystitis in Feb 2022 and had percutaneous cholecystectomy tube placement.  Presented this admission for neuroendocrine tumor resection with Whipple procedure, also had right total colectomy, colostomy and cholecystectomy on 09/28/2020.  SMV was lacerated during procedure and repaired by bovine pericardial patch.  Developed hemorrhagic shock with bile leak post-op and underwent placement of biliary T tube with abdominal washout on 3/15.    Pt discussed during ICU rounds and with RN. Per CCM [facility] evaluating for SB transplant. Sedation weaning, CCM considering trach placement.   3/14 s/p whipple; intra-operative SMV laceration s/p repair; massive transfusion protocol - 120 products; maxed on pressors  3/15 s/p washout and VAC placement  3/16 off pressors; VAC output decreasing, hematemesis with NG not functioning, OG placed 3/17 s/p re-opening of recent laparotomy, SBR, VAC placement (per OR report pt with SB necrosis, only viable areas are stomach pb limb, 40-50 cm beyond GJ anastomosis, and colon) Pt will be TPN dependent. Concern that PB limb may become necrotic.  3/18 TPN initiated @ 45 ml/hr with plans to advance to 90 ml on 3/19 3/22 entirety of SB resected due to necrosis.   Patient is currently  intubated on ventilator support MV: 8.5 L/min Temp (24hrs), Avg:99.6 F (37.6 C), Min:98.6 F (37 C), Max:100.22 F (37.9 C)  Medications reviewed and include: solucortef, SSI Precedex  Levophed @ 1 mcg   Labs reviewed: K+ 3.4 CBG's: 210-236   Output:  Drain 3: 225 ml  R drain: 175 ml  RUQ drain: 335 ml  RLQ drain: 350 ml  20 F G tube: 30 ml  TPN @ 90 provides: 2559 kcal and 170 grams protein   Diet Order:   Diet Order            Diet NPO time specified  Diet effective now                 EDUCATION NEEDS:   No education needs have been identified at this time  Skin:  Skin Assessment: Reviewed RN Assessment  Last BM:  200 ml via colostomy  Height:   Ht Readings from Last 1 Encounters:  09/17/2020 6' 2"  (1.88 m)    Weight:   Wt Readings from Last 1 Encounters:  10/01/20 124.9 kg    Ideal Body Weight:     BMI:  Body mass index is 35.35 kg/m.  Estimated Nutritional Needs:   Kcal:  2800-3000  Protein:  185-210 grams  Fluid:  > 2L/day  Lockie Pares., RD, LDN, CNSC See AMiON for contact information

## 2020-10-02 NOTE — Progress Notes (Signed)
Attempted to get a consent for PICC placement with patient's wife and brother, unable to get a hold of them with no answer via phone. RN made aware. Will follow up.

## 2020-10-02 NOTE — Progress Notes (Signed)
Dilaudid drip discontinued. 70cc Dilaudid wasted in med room witnessed by [employer].

## 2020-10-02 NOTE — Progress Notes (Signed)
3 Days Post-Op  Subjective: Off pressors as of 6am today. Afebrile overnight. High UOP, net negative yesterday. Creatinine slowly downtrending. Now grimacing to pain. Tbili stable.  Objective: Vital signs in last 24 hours: Temp:  [98.78 F (37.1 C)-101.3 F (38.5 C)] 98.78 F (37.1 C) (03/24 0600) Pulse Rate:  [66-81] 79 (03/24 0733) Resp:  [12-31] 30 (03/24 0733) BP: (103-126)/(46-68) 119/62 (03/24 0600) SpO2:  [92 %-100 %] 100 % (03/24 0733) Arterial Line BP: (99-127)/(46-59) 127/59 (03/24 0600) FiO2 (%):  [40 %-50 %] 40 % (03/24 0733) Weight:  [124.9 kg] 124.9 kg (03/23 1503) Last BM Date:  (pta)  Intake/Output from previous day: 03/23 0701 - 03/24 0700 In: 2218.7 [I.V.:1695.2; IV Piggyback:498.5] Out: 9163 [Urine:3650; Drains:1115] Intake/Output this shift: No intake/output data recorded.  PE: General: intubated,  anasarca HEENT: OG and ETT in place. CV: RRR Resp: intubated, on vent Abdomen: Soft. Midline incision open at the skin, wound bed is clean and dry. RUQ biliary drain with bile. RUQ JPx2 with serosanguinous drainage. LUQ pancreatic stent with clear colorless fluid. G tube to gravity with minimal drainage. GU: foley in place, draining yellow urine  Lab Results:  Recent Labs    09/30/20 0403 10/01/20 0419  WBC 35.6* 24.8*  HGB 9.4* 8.2*  HCT 28.0* 24.8*  PLT 119* 122*   BMET Recent Labs    10/01/20 0419 10/02/20 0500  NA 140 144  K 4.1 3.4*  CL 109 112*  CO2 23 23  GLUCOSE 201* 261*  BUN 84* 88*  CREATININE 2.91* 2.53*  CALCIUM 8.6* 8.8*   PT/INR Recent Labs    09/16/2020 1624  LABPROT 16.1*  INR 1.4*   CMP     Component Value Date/Time   NA 144 10/02/2020 0500   NA 142 07/21/2020 0921   K 3.4 (L) 10/02/2020 0500   CL 112 (H) 10/02/2020 0500   CO2 23 10/02/2020 0500   GLUCOSE 261 (H) 10/02/2020 0500   BUN 88 (H) 10/02/2020 0500   BUN 16 07/21/2020 0921   CREATININE 2.53 (H) 10/02/2020 0500   CREATININE 1.16 03/01/2016 1607    CALCIUM 8.8 (L) 10/02/2020 0500   PROT 5.0 (L) 10/02/2020 0500   PROT 6.7 07/21/2020 0921   ALBUMIN 1.4 (L) 10/02/2020 0500   ALBUMIN 4.1 07/21/2020 0921   AST 107 (H) 10/02/2020 0500   ALT 52 (H) 10/02/2020 0500   ALKPHOS 60 10/02/2020 0500   BILITOT 4.9 (H) 10/02/2020 0500   BILITOT 0.8 07/21/2020 0921   GFRNONAA 30 (L) 10/02/2020 0500   GFRNONAA >89 09/22/2015 1008   GFRAA 82 07/21/2020 0921   GFRAA >89 09/22/2015 1008   Lipase     Component Value Date/Time   LIPASE 23 08/22/2020 0318       Studies/Results: No results found.    Assessment/Plan 50 yo male with a neuroendocrine tumor of the head of the pancreas, POD10 s/p right hemicolectomy, end ileostomy, Whipple and patch angioplasty repair of SMV. Complicated by postoperative hemorrhagic shock with DIC with extensive small bowel necrosis. All of small bowel has been resected, with external drainage of the bile duct, pancreas and stomach. - TPN - Continue antibiotics - Wean sedation, patient has started to grimace to pain but overall still somnolent. Discussed with CCM, will stop dilaudid gtt and transition to intermittent pain meds. - Wound care: saline wet-to-dry dressings BID to midline incision and prior ostomy site - I talked to patient's brother at bedside yesterday. Explained that he seems to be  slowly recovering from septic shock and overall his long-term prognosis is still very guarded. We will continue supportive care. - VTE: SQH - Dispo: ICU   LOS: 10 days    Michaelle Birks, MD [facility] General, Hepatobiliary and Pancreatic Surgery 10/02/20 8:42 AM

## 2020-10-03 ENCOUNTER — Inpatient Hospital Stay (HOSPITAL_COMMUNITY): Payer: Managed Care, Other (non HMO)

## 2020-10-03 DIAGNOSIS — K559 Vascular disorder of intestine, unspecified: Secondary | ICD-10-CM | POA: Diagnosis not present

## 2020-10-03 DIAGNOSIS — R579 Shock, unspecified: Secondary | ICD-10-CM | POA: Diagnosis not present

## 2020-10-03 DIAGNOSIS — D3A8 Other benign neuroendocrine tumors: Secondary | ICD-10-CM | POA: Diagnosis not present

## 2020-10-03 DIAGNOSIS — J9601 Acute respiratory failure with hypoxia: Secondary | ICD-10-CM | POA: Diagnosis not present

## 2020-10-03 LAB — COMPREHENSIVE METABOLIC PANEL
ALT: 89 U/L — ABNORMAL HIGH (ref 0–44)
AST: 140 U/L — ABNORMAL HIGH (ref 15–41)
Albumin: 1.5 g/dL — ABNORMAL LOW (ref 3.5–5.0)
Alkaline Phosphatase: 83 U/L (ref 38–126)
Anion gap: 11 (ref 5–15)
BUN: 106 mg/dL — ABNORMAL HIGH (ref 6–20)
CO2: 21 mmol/L — ABNORMAL LOW (ref 22–32)
Calcium: 8.9 mg/dL (ref 8.9–10.3)
Chloride: 111 mmol/L (ref 98–111)
Creatinine, Ser: 2.82 mg/dL — ABNORMAL HIGH (ref 0.61–1.24)
GFR, Estimated: 27 mL/min — ABNORMAL LOW (ref >60)
Glucose, Bld: 229 mg/dL — ABNORMAL HIGH (ref 70–99)
Potassium: 3.5 mmol/L (ref 3.5–5.1)
Sodium: 143 mmol/L (ref 135–145)
Total Bilirubin: 4.2 mg/dL — ABNORMAL HIGH (ref 0.3–1.2)
Total Protein: 4.9 g/dL — ABNORMAL LOW (ref 6.5–8.1)

## 2020-10-03 LAB — GLUCOSE, CAPILLARY
Glucose-Capillary: 215 mg/dL — ABNORMAL HIGH (ref 70–99)
Glucose-Capillary: 224 mg/dL — ABNORMAL HIGH (ref 70–99)
Glucose-Capillary: 204 mg/dL — ABNORMAL HIGH (ref 70–99)
Glucose-Capillary: 191 mg/dL — ABNORMAL HIGH (ref 70–99)
Glucose-Capillary: 185 mg/dL — ABNORMAL HIGH (ref 70–99)
Glucose-Capillary: 214 mg/dL — ABNORMAL HIGH (ref 70–99)

## 2020-10-03 LAB — URINALYSIS, ROUTINE W REFLEX MICROSCOPIC
Bilirubin Urine: NEGATIVE
Glucose, UA: 50 mg/dL — AB
Ketones, ur: NEGATIVE mg/dL
Leukocytes,Ua: NEGATIVE
Nitrite: NEGATIVE
Protein, ur: NEGATIVE mg/dL
Specific Gravity, Urine: 1.014 (ref 1.005–1.030)
pH: 5 (ref 5.0–8.0)

## 2020-10-03 LAB — CBC
HCT: 28.9 % — ABNORMAL LOW (ref 39.0–52.0)
Hemoglobin: 9.3 g/dL — ABNORMAL LOW (ref 13.0–17.0)
MCH: 29.9 pg (ref 26.0–34.0)
MCHC: 32.2 g/dL (ref 30.0–36.0)
MCV: 92.9 fL (ref 80.0–100.0)
Platelets: 255 10*3/uL (ref 150–400)
RBC: 3.11 MIL/uL — ABNORMAL LOW (ref 4.22–5.81)
RDW: 19.4 % — ABNORMAL HIGH (ref 11.5–15.5)
WBC: 27.1 10*3/uL — ABNORMAL HIGH (ref 4.0–10.5)
nRBC: 2.1 % — ABNORMAL HIGH (ref 0.0–0.2)

## 2020-10-03 LAB — PHOSPHORUS: Phosphorus: 2.5 mg/dL (ref 2.5–4.6)

## 2020-10-03 LAB — MAGNESIUM: Magnesium: 2.6 mg/dL — ABNORMAL HIGH (ref 1.7–2.4)

## 2020-10-03 LAB — TRIGLYCERIDES: Triglycerides: 726 mg/dL — ABNORMAL HIGH (ref <150)

## 2020-10-03 MED ORDER — POTASSIUM CHLORIDE 10 MEQ/50ML IV SOLN
10.0000 meq | INTRAVENOUS | Status: AC
  Administered 2020-10-03 (×4): 10 meq via INTRAVENOUS
  Filled 2020-10-03 (×4): qty 50

## 2020-10-03 MED ORDER — SODIUM CHLORIDE 0.9% FLUSH
10.0000 mL | INTRAVENOUS | Status: DC | PRN
  Administered 2020-10-20 – 2020-11-20 (×2): 10 mL

## 2020-10-03 MED ORDER — ZINC CHLORIDE 1 MG/ML IV SOLN
INTRAVENOUS | Status: AC
  Filled 2020-10-03: qty 1238.4

## 2020-10-03 MED ORDER — INSULIN DETEMIR 100 UNIT/ML ~~LOC~~ SOLN
10.0000 [IU] | Freq: Once | SUBCUTANEOUS | Status: AC
  Administered 2020-10-03: 10 [IU] via SUBCUTANEOUS
  Filled 2020-10-03: qty 0.1

## 2020-10-03 MED ORDER — SODIUM CHLORIDE 0.9% FLUSH
10.0000 mL | Freq: Two times a day (BID) | INTRAVENOUS | Status: DC
  Administered 2020-10-04 (×2): 10 mL
  Administered 2020-10-04 – 2020-10-05 (×2): 20 mL
  Administered 2020-10-05 – 2020-10-06 (×2): 10 mL
  Administered 2020-10-06: 30 mL
  Administered 2020-10-07: 40 mL
  Administered 2020-10-07 – 2020-10-11 (×9): 10 mL
  Administered 2020-10-12: 20 mL
  Administered 2020-10-12 – 2020-10-19 (×13): 10 mL
  Administered 2020-10-19: 20 mL
  Administered 2020-10-20 – 2020-10-23 (×7): 10 mL
  Administered 2020-10-23: 20 mL
  Administered 2020-10-24 – 2020-10-25 (×4): 10 mL
  Administered 2020-10-26: 20 mL
  Administered 2020-10-26: 10 mL
  Administered 2020-10-27: 20 mL
  Administered 2020-10-27 – 2020-11-02 (×11): 10 mL
  Administered 2020-11-02: 20 mL
  Administered 2020-11-03 (×2): 10 mL
  Administered 2020-11-04: 30 mL
  Administered 2020-11-04 – 2020-11-05 (×2): 10 mL
  Administered 2020-11-05: 30 mL
  Administered 2020-11-06: 10 mL
  Administered 2020-11-06: 40 mL
  Administered 2020-11-07 – 2020-11-15 (×16): 10 mL
  Administered 2020-11-16: 20 mL
  Administered 2020-11-16 – 2020-11-23 (×13): 10 mL

## 2020-10-03 NOTE — Progress Notes (Signed)
Discussion held with patient's friend and brother for a clinical update. Discussed improved renal function and evidence of hepatocellular dysfunction. Again reiterated that in my opinion, the patient is not stable/appopriate at this point for transfer. Friend opened the discussion regarding extubation and I expressed that I do not feel the patient will be a candidate for extubation and likely would need a tracheostomy to liberate from the ventilator. They verbalized understanding. We furthered the discussion regarding not initiating chest compressions if cardiac arrest occurs. I explained that if he were to get sick enough for his heart to stop, in this clinical scenario, the likelihood that resuscitation would result in a functional outcome that matches their goals (SBT), would essentially be zero. Another family member stepped into the room during this portion of the conversation and was firm in the desire to pursue resuscitation with chest compressions, however, the patient's friend seemed to be considering my words. The friend and brother expressed that they would further the conversation with other family members and discuss with their imam. They continue to be in agreement against defibrillation. No changes were made to code status.   During the conversation, the patient was hypotensive to MAPs high 50s. Levophed was initiated and a CBC sent. Levo as high as 10, may need to consider transition to epi.   Critical care time: 43min  [name], MD General and [facility]

## 2020-10-03 NOTE — Progress Notes (Signed)
Pt transported to and from CT scan on the ventilator without incident. 

## 2020-10-03 NOTE — Progress Notes (Signed)
[name], MRN:  [id number], DOB:  [birth year], LOS: 5 ADMISSION DATE:  10/01/2020, CONSULTATION DATE:  10/03/20 REFERRING MD:  Anesthesia, CHIEF COMPLAINT:  Whipple, post-op shock   Brief History:  50 y.o. M with PMH of neuroendocrine tumor and underwent Whipple procedure 3/14 with intra-operative SMV laceration and repair by vascular with bovine pericardial patch.  Pt had worsening shock overnight and required three pressors despite massive transfusion protocol (received 150 blood products).  He was taken back to the OR for washout on 3/15 and returned to the ICU intubated.  Taken back 3/17 again found to have small bowel necrosis s/p resection.  Plans again to return 3/19 to OR.   Totality of small bowel has been resected. The case was discussed with [facility] who may consider eval for sm intestinal transplant pending clinical course.  Continuing aggressive supportive care.   Past Medical History:   has a past medical history of AICD (automatic cardioverter/defibrillator) present, Dyslipidemia, Heart failure with mildly reduced EF, History of kidney stones, HTN (hypertension), ventricular fibrillation, and Nonischemic cardiomyopathy.   Significant Hospital Events:  3/14: Admit to Surgery, to OR for whipple, SMV injury and repair, massive transfusion protocol overnight  3/15: Back to OR for washout, x2. IR for arteriogram. No major bleed source identified in OR, or arterial bleed w IR. Robust product resuscitation >75 products (+ TXA + novoseven) 3/16: off pressors, add'l 39 products overnight. Slowing resuscitation this morning with hemodynamic improvements and slowing drain output; OR x 2 - washout, biliary t tube, wound vac -- washout, wound vac, IR for arteriogram -- no arterial bleed for embolization 3/17:  Aggressive balanced transfusion continue overnight with marked hemodynamic improvement since yesterday evening. Off pressors x several hours, Wound vac output significantly  slowing (from 52ml q15-88min to 519ml q1hr+) and output is much thinner, Thin bloody secretions from mouth approx 259ml, Decreased RR from 22 to 18, Unable to tolerate NE- severe bradycardia 3/19 taken back to OR. Small bowel noted to be almost completely necrosed. Patient was closed with plans for goals of car discussion.  3/20 GOC family endorses full scope of treatment. Primary team discussed with [facility] the possibility of transfer for small bowel transplant. [facility] felt as though transfer would not change outcome. 3/21 Ongoing discussion with family and [facility]. Patient may be a candidate for transplant if he can stabilize post a small bowel resection. He was taken back to the OR and small bowel was resected.  3/23 weaning pressors. Versed off. Remains encephalopathic 3/24 off pressors. Low dose dilaudid gtt -- only weakly grimaces to pain. Changing sedation to precedex + PRN fentanyl. Long discussion with 2 brothers regarding clinical case -- tried to clarify that while the term "stable" has been used, in this instance is meaning that he has not declined from previous shift but is in fact still critically ill with multisystem organ dysfunction.  3/25 Cr and BUN increased. Off sedation   Consults:  PCCM VVS  IR  Palliative  Procedures:  3/15 ETT 3/15 R IJ cordis/ CVL >> 3/15 left aline >>  Significant Diagnostic Tests:  3/15 mesenteric arteriogram> no identified arterial source of bleed 3/18 Echo > LVEF 40-45%, Grade 1 DD  Micro Data:  3/15 MRSA>>negative 3/23 BCx>> (no growth < 24 hrs)   Antimicrobials:  Ancef 3/14 Flagyl 3/14 3/17 flagyl > 3/20 3/17 ceftriaxone > 3/18 vanc > 3/20  Zosyn 3/21 > Eraxis 3/23>   Interim History / Subjective:  Started on NE 3/24  afternoon-- has been weaned down to 4. SBPs 140s on 4 so I shut it off   Cr has increased in interval, along with BUN (>100 now). UOP has decreased from prior shifts, but is still adequate. Averaging 0.8-0.9 ml/kg/hr  Has  been off continuous sedation for almost 24 hrs   Back on PSV this morning   TMax 102.02, WBC down to 20     Objective   Blood pressure (!) 150/59, pulse 90, temperature (!) 100.4 F (38 C), resp. rate (!) 24, height 6\' 2"  (1.88 m), weight 124.9 kg, SpO2 100 %.    Vent Mode: PRVC FiO2 (%):  [40 %] 40 % Set Rate:  [30 bmp] 30 bmp Vt Set:  [580 mL] 580 mL PEEP:  [5 cmH20] 5 cmH20 Pressure Support:  [5 cmH20] 5 cmH20 Plateau Pressure:  [19 cmH20-33 cmH20] 19 cmH20   Intake/Output Summary (Last 24 hours) at 10/03/2020 1001 Last data filed at 10/03/2020 0900 Gross per 24 hour  Intake 4708.58 ml  Output 4635 ml  Net 73.58 ml   Filed Weights   09/28/2020 0639 10/01/20 1503  Weight: 95.3 kg 124.9 kg    Examination: General: Critically ill appearing middle aged man. Intubated, not on sedation HEENT: [place]. RIJ CVC. ETT secure. Unchanged scleral icterus   Neuro: Off sedation. Grimaces to pain. Localized LLE to noxious stimuli.  CV: reg rate. Some PVCs. s1s2 cap refill < 3 sec.  PULM:  Symmetrical chest expansion, no accessory use on PSV/CPAP. Diminished bibasilar sounds.  GI: Distended, soft. Midline abdominal incision dressed. JPs with serosanguinous drainage.  GU: Edematous scrotum, engulfing penis. Foley with dark yellow urine  Extremities: Anasarca. No acute joint deformity. No cyanosis or clubbing  Skin: warm, c/d  Resolved Hospital Problem list   Hemorrhagic shock hypophosphatemia  Assessment & Plan:   Acute encephalopathy - metabolic encephalopathy   -sedating medications, critical illness, multisystem organ dysfunction -with AKI + uremia and totality of sedation this admission, anticipate this will take some time to clear. P -off continuous sedation (precedex + PRNs available, goal RASS0) -trend neuro exam   Acute respiratory failure with hypoxia requiring intubation -has bee intubated since 3/15  -CXR 3/25 with some patchy opacities -- probably some edema, maybe a  component of atelectasis given prolonged PSV P - On PSV again - not a candidate for extubation with sever encephalopathy  - Again, if this course of care to continue need to consider a trach  -VAP -sedation as above  -could consider some lasix for pulm vascular congestion/edema. Ok to defer 3/25 though as oxygenation is ok & renal function worse   Neuroendocrine tumor s/p whipple and R hemicolectomy, SMV repair (3/14 whipple -- c/b hemorrhagic shock and coagulopathy s/p washout and abthera x 2, biliary ttube 3/15-- c/b bowel necrosis requiring multiple returns to OR as below  - Closed 3/19.  Small bowel necrosis s/p total resection (3/17, 3/19, 3/22)  P - management per CCS - TPN  (currently going through R IJ CVC)   - Wound care - monitor outpt from drains - The current plan is to continue supportive care in effort to stabilize enough for eval for small intestinal transplant   Distributive shock/suspected septic shock in setting of small bowl infarction -- improving  Entire small bowel is unsalvageable per Op note 3/19. He was taken back to OR 3/21 for resection.  BCx 3/23  No growth at 2 days  P - Shut off NE 3/25  - ICU monitoring  -  if needs pressors again, does seem to have better response to Epi than NE  - Zosyn, eraxis - BCx sent 3/23 -- no growth at 2d  - still needs central access for TPN, would prefer PICC   NICM, compensated- s/p placement  LVEF 45% P - cont to hold home bisoprolol, entresto -- shock   AKI, non-oliguric -worse uremia -significant volume overload-- Shock limits diuresis.  -Cr had plateau  Scrotal edema, anasarca, volume overload P -Trend renal indices, I/O -maintain foley   Malnutrition -total small bowel resection. Incr metabolic demand in setting of critical illness, surgeries  P -TPN  -electrolyte optimization via TPN per pharmacist   Hyperglycemia -SSI + correction via TPN per pharmacist   L/T/D -R IJ CVC placed 3/15 -- needs central  access for TPN, would prefer PICC  -arterial line -Foley - need to continue due to extreme edema  -ETT placed 3/15 -- will need a trach if aggressive care is to be continued  -PIVs- continue   [place] - Limited code. No shocks. CPR OK.  -Family maintains hope for "total recovery" -- I (grace CCM NP) spoke with brothers at length 3/24 regarding clinical picture.   - The plan we are working toward is to stabilize him for a transfer to [facility] for possible small bowel transplant evaluation.   -overall prognosis remains very guarded from my standpoint. Some improvements to certain aspects of present critical illness, some declines. If critical illness improved/resolved, he would still need to be eval'd for small intestine transplant -- with underlying NICM I worry about his candidacy independent of factors pertaining to current critical illness    Daily Goals Checklist  Pain/Anxiety/Delirium protocol (if indicated): off sedation but  precedex + PRN fent available VAP protocol (if indicated): bundle in place.  DVT prophylaxis: SCDs Nutrition Status: On TPN GI prophylaxis: Pantoprazole Urinary catheter: still needed 3/25 Central lines: Right IJ central line still needed -- hope to exchange for PICC when able  Glucose control: -SSI  Mobility/therapy needs: Bedrest Restraints: Restraint type:NA Reason for restraints:NA Daily labs: CBC, CMP daily Code Status: Partial - -no CV/DF (has an ICD however) Family Communication: Ongoing family discussions by primary team.  Last multidisciplinary [place] 3/19, detailed below Brothers updated at bedside 3/24 by CCM  Disposition: ICU.   Goals of Care:  Last date of multidisciplinary goals of care discussion: 3/19 Family and staff present: Dr. Lynetta Mare, Ricky Ala and multiple family members Summary of discussion: Family wishes for aggressive care. The target is to stabilize and possibly transfer to [facility] for small intestine transplant evaluation    Code Status:  Partial - no CV/DF by [place] (has an ICD which is not to be turned off)   CRITICAL CARE Performed by: Cristal Generous   Total critical care time: 64 minutes  Critical care time was exclusive of separately billable procedures and treating other patients.  Critical care was necessary to treat or prevent imminent or life-threatening deterioration.  Critical care was time spent personally by me on the following activities: development of treatment plan with patient and/or surrogate as well as nursing, discussions with consultants, evaluation of patient's response to treatment, examination of patient, obtaining history from patient or surrogate, ordering and performing treatments and interventions, ordering and review of laboratory studies, ordering and review of radiographic studies, pulse oximetry and re-evaluation of patient's condition.   [name] MSN, AGACNP-BC [place] [phone] If no answer, [phone] 10/03/2020, 11:07 AM

## 2020-10-03 NOTE — Progress Notes (Signed)
4 Days Post-Op  Subjective: Pressors resumed yesterday, on levo at 6 this morning. Continues to have intermittent fevers. Making urine but creatinine slightly up, BUN>100. Bilirubin remains elevated, biliary drain is functioning.   Objective: Vital signs in last 24 hours: Temp:  [98.6 F (37 C)-102.02 F (38.9 C)] 100.22 F (37.9 C) (03/25 0600) Pulse Rate:  [57-93] 59 (03/25 0600) Resp:  [14-35] 28 (03/25 0600) BP: (93-147)/(39-64) 140/53 (03/25 0600) SpO2:  [97 %-100 %] 100 % (03/25 0737) Arterial Line BP: (90-155)/(42-92) 95/92 (03/25 0600) FiO2 (%):  [40 %] 40 % (03/25 0737) Last BM Date:  (pta)  Intake/Output from previous day: 03/24 0701 - 03/25 0700 In: 4450.2 [I.V.:3386.7; IV Piggyback:1063.5] Out: 4755 [Urine:2590; Drains:2165] Intake/Output this shift: No intake/output data recorded.  PE: General: intubated,  anasarca HEENT: OG and ETT in place. CV: RRR Resp: intubated, on vent Abdomen: Soft. Midline incision open at the skin, wound bed is clean and dry. RUQ biliary drain with bile. RUQ JPx2 with serosanguinous drainage. LUQ pancreatic stent with clear colorless fluid. G tube to gravity with minimal drainage. GU: foley in place, draining yellow urine  Lab Results:  Recent Labs    10/02/20 1028 10/02/20 1208  WBC 17.5* 20.0*  HGB 8.4* 8.5*  HCT 25.9* 26.7*  PLT 181 200   BMET Recent Labs    10/02/20 0500 10/03/20 0342  NA 144 143  K 3.4* 3.5  CL 112* 111  CO2 23 21*  GLUCOSE 261* 229*  BUN 88* 106*  CREATININE 2.53* 2.82*  CALCIUM 8.8* 8.9   PT/INR No results for input(s): LABPROT, INR in the last 72 hours. CMP     Component Value Date/Time   NA 143 10/03/2020 0342   NA 142 07/21/2020 0921   K 3.5 10/03/2020 0342   CL 111 10/03/2020 0342   CO2 21 (L) 10/03/2020 0342   GLUCOSE 229 (H) 10/03/2020 0342   BUN 106 (H) 10/03/2020 0342   BUN 16 07/21/2020 0921   CREATININE 2.82 (H) 10/03/2020 0342   CREATININE 1.16 03/01/2016 1607    CALCIUM 8.9 10/03/2020 0342   PROT 4.9 (L) 10/03/2020 0342   PROT 6.7 07/21/2020 0921   ALBUMIN 1.5 (L) 10/03/2020 0342   ALBUMIN 4.1 07/21/2020 0921   AST 140 (H) 10/03/2020 0342   ALT 89 (H) 10/03/2020 0342   ALKPHOS 83 10/03/2020 0342   BILITOT 4.2 (H) 10/03/2020 0342   BILITOT 0.8 07/21/2020 0921   GFRNONAA 27 (L) 10/03/2020 0342   GFRNONAA >89 09/22/2015 1008   GFRAA 82 07/21/2020 0921   GFRAA >89 09/22/2015 1008   Lipase     Component Value Date/Time   LIPASE 23 08/22/2020 0318       Studies/Results: DG CHEST PORT 1 VIEW  Result Date: 10/03/2020 CLINICAL DATA:  Intubation EXAM: PORTABLE CHEST 1 VIEW COMPARISON:  Radiograph 09/26/2020 FINDINGS: Battery pack overlies left chest wall with pacer/defibrillator lead directed towards the cardiac apex. Right IJ vascular sheath with catheter tip in the mid SVC. Transesophageal tube tip and side port terminate below the margins of imaging beyond the GE junction. Telemetry leads and external support devices overlie the chest. Diffuse pulmonary vascular congestion. Mixed hazy and patchy opacities are present throughout the lungs which could reflect edema or airspace disease with more confluent opacity in the retrocardiac space. Left effusion not excluded. No visible right effusion or pneumothorax. Additional areas of more bandlike opacity likely reflecting subsegmental atelectasis. Prominence of the cardiac silhouette may be related to  portable technique and low volumes. No other acute osseous or soft tissue abnormality. IMPRESSION: 1. Diffuse hazy and patchy opacities throughout the lungs with more confluent opacity in the retrocardiac space. Could reflect atelectasis, edema and/or airspace disease with layering effusion. 2. Additional areas of subsegmental atelectasis. 3. Support devices as above. Electronically Signed   By: Lovena Le M.D.   On: 10/03/2020 05:52   [place] EKG SITE RITE  Result Date: 10/02/2020 If Site Rite image not  attached, placement could not be confirmed due to current cardiac rhythm.     Assessment/Plan 50 yo male with a neuroendocrine tumor of the head of the pancreas, POD11 s/p right hemicolectomy, end ileostomy, Whipple and patch angioplasty repair of SMV. Complicated by postoperative hemorrhagic shock with DIC with extensive small bowel necrosis. All of small bowel has been resected, with external drainage of the bile duct, pancreas and stomach. - TPN - Continue antibiotics. Patient has ongoing sepsis. - Wound care: saline wet-to-dry dressings BID to midline incision and prior ostomy site - Family not at bedside this morning. I will find time to talk to them either today or tomorrow. Prognosis remains very guarded. I don't think mental status will improve with the degree of uremia he has and we will likely need to consider dialysis if it continues to rise. Will also need to discuss trach in the next few days as he is not currently close to liberating from the ventilator. - VTE: SQH - Dispo: ICU   LOS: 11 days    Michaelle Birks, MD [facility] Surgery General, Hepatobiliary and Pancreatic Surgery 10/03/20 7:54 AM

## 2020-10-03 NOTE — Progress Notes (Addendum)
PHARMACY - TOTAL PARENTERAL NUTRITION CONSULT NOTE  Indication:  Expected prolonged ileus  Patient Measurements: Height: 6\' 2"  (188 cm) Weight: 124.9 kg (275 lb 5.7 oz) IBW/kg (Calculated) : 82.2 TPN AdjBW (KG): 95.3 Body mass index is 35.35 kg/m.  Assessment:  55 YOM with history of mass adjacent to head of the pancreas in 2021.  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  Developed cholecystitis in Feb 2022 and had percutaneous cholecystectomy tube placement.  Presented this admission for neuroendocrine tumor resection with Whipple procedure, also had right total colectomy, colostomy and cholecystectomy on 09/24/2020. SMV was lacerated during procedure and repaired by bovine pericardial patch. Developed hemorrhagic shock with bile leak post-op and underwent placement of biliary T tube with abdominal washout on 3/15 PM. Abdomen remains open. Pharmacy consulted to manage TPN.  Glucose / Insulin: no hx DM, A1c 5.2%  CBGs previously controlled <180, now elevated 210-229 after receiving stress steroids (HC 100mg  IV q8h started 3/22 PM). Utilized 40 units SSI used in last 24h + 20 units in the TPN bag Electrolytes:  K 3.5 - will replace, Phos 2.5; Mag high 2.6; others WNL Renal: AKI, SCr 2.91>2.53>2.82 (BL ~1-1.2), BUN up to 84>88>106 Hepatic: AST mildly elevated - trend up, ALT WNL, tbili  4.9, TG back up to 249>726 - will remove lipids from TPN. Albumin 1.5, BL prealbumin <5 Intake / Output; MIVF: UOP 0.9 ml/kg/hr, drain output 2165 ml/24hrs,  concentrate TPN per CCM) GI Imaging: none since TPN start GI Surgeries / Procedures: 3/14 Whipple procedure with en-bloc right hemicolectomy and end ileostomy, cholecystectomy 3/15 OR for washout, VAC placement 3/17 OR for washout, VAC replacement.  Unlikely to close abdomen d/t edema - necrosis of the entire small bowel except for the PB limb and proximal 40-50cm of jejunum 3/19 OR for re-opening laparotomy, abdominal closure,  found to have necrotic SB and deemed not survivable condition 3/21 OR for SB resection, take down of choledochojejunal anastomosis and pancreaticojejunal anastomosis, take down of duodenojejunal anastomosis, placement of externalized biliary drain / Stamm gastrostomy tube, intraabdominal washout and placement of intraperitoneal drains  Central access: CVC double lumen placed 09/18/2020 TPN start date: 09/15/2020  Nutritional Goals (RD rec on 3/24): 2800-3000 kCal, 185-210g AA, fluid >2L/day  Goal TPN is 90 ml/hr - will provide 185g AA, 583g CHO,  for total 2725 kCal per day  Current Nutrition:  TPN; NPO  Plan:  -Continue concentrated TPN at goal rate of 90 ml/hr. Will remove lipids today given TG 726 - watch trend closely -Electrolytes in TPN: Na 25 mEq/L, incr K 10mEq/L, decr Ca 67mEq/L, decr Mag to 30mEq/L, add back Phos 3 mmol/L, max acetate -Give KCL 10 meq IV x 4 -Add MVI. Remove standard trace elements and add back zinc 5mg , chromium 17mcg, selenium 45mcg (with previous Tbili elevated >6 and scleral icterus noted - consider adding back full trace elements if normalizes) -Continue moderate SSI Q4H + incr regular insulin 40 units to TPN bag (watch CBGs closely on stress steroids and increase of CHO due to inability to tolerate lipids) -F/U TPN labs, GOC   Alanda Slim, PharmD, [facility] Clinical Pharmacist Please see AMION for all Pharmacists' Contact Phone Numbers 10/03/2020, 7:16 AM

## 2020-10-03 NOTE — Progress Notes (Signed)
CSW received consult to assist in writing a letter for patient's family to send to the [place] so family can come from a different country. CSW spoke with patient's brother, [name]. He provided info on who will be coming (his brother, [name] DOB 08/25/86). CSW will provide letter to brother.   Nadia Rayyan LCSW

## 2020-10-03 NOTE — Progress Notes (Signed)
1Peripherally Inserted Central Catheter Placement  The IV Nurse has discussed with the patient and/or persons authorized to consent for the patient, the purpose of this procedure and the potential benefits and risks involved with this procedure.  The benefits include less needle sticks, lab draws from the catheter, and the patient may be discharged home with the catheter. Risks include, but not limited to, infection, bleeding, blood clot (thrombus formation), and puncture of an artery; nerve damage and irregular heartbeat and possibility to perform a PICC exchange if needed/ordered by physician.  Alternatives to this procedure were also discussed.  Bard Power PICC patient education guide, fact sheet on infection prevention and patient information card has been provided to patient /or left at bedside.    PICC Placement Documentation  PICC Triple Lumen 10/03/20 PICC Right Brachial 41 cm 0 cm (Active)  Indication for Insertion or Continuance of Line Prolonged intravenous therapies 10/03/20 1043  Exposed Catheter (cm) 0 cm 10/03/20 1043  Site Assessment Clean;Dry;Intact 10/03/20 1043  Lumen #1 Status Flushed;Blood return noted;Saline locked 10/03/20 1043  Lumen #2 Status Flushed;Blood return noted;Saline locked 10/03/20 1043  Lumen #3 Status Flushed;Blood return noted;Saline locked 10/03/20 1043  Dressing Type Transparent 10/03/20 1043  Dressing Status Clean;Dry;Intact 10/03/20 1043  Antimicrobial disc in place? Yes 10/03/20 1043  Dressing Change Due 10/10/20 10/03/20 1043       Scotty Court 10/03/2020, 10:45 AM

## 2020-10-03 NOTE — TOC CAGE-AID Note (Signed)
Transition of Care [facility]) - CAGE-AID Screening   Patient Details  Name: IZIK BINGMAN MRN: [id number] Date of Birth: [birth year]  Transition of [facility]) CM/SW Contact:    Benard Halsted, LCSW Phone Number: 10/03/2020, 9:23 AM   Clinical Narrative: Patient not able to participate in screening.    CAGE-AID Screening: Substance Abuse Screening unable to be completed due to: : Patient unable to participate

## 2020-10-04 ENCOUNTER — Inpatient Hospital Stay (HOSPITAL_COMMUNITY): Payer: Managed Care, Other (non HMO)

## 2020-10-04 DIAGNOSIS — Z9289 Personal history of other medical treatment: Secondary | ICD-10-CM

## 2020-10-04 DIAGNOSIS — J9601 Acute respiratory failure with hypoxia: Secondary | ICD-10-CM | POA: Diagnosis not present

## 2020-10-04 DIAGNOSIS — D3A8 Other benign neuroendocrine tumors: Secondary | ICD-10-CM | POA: Diagnosis not present

## 2020-10-04 LAB — PHOSPHORUS: Phosphorus: 1.9 mg/dL — ABNORMAL LOW (ref 2.5–4.6)

## 2020-10-04 LAB — CBC
HCT: 26.5 % — ABNORMAL LOW (ref 39.0–52.0)
Hemoglobin: 8.6 g/dL — ABNORMAL LOW (ref 13.0–17.0)
MCH: 29.2 pg (ref 26.0–34.0)
MCHC: 32.5 g/dL (ref 30.0–36.0)
MCV: 89.8 fL (ref 80.0–100.0)
Platelets: 259 10*3/uL (ref 150–400)
RBC: 2.95 MIL/uL — ABNORMAL LOW (ref 4.22–5.81)
RDW: 19.3 % — ABNORMAL HIGH (ref 11.5–15.5)
WBC: 16.8 10*3/uL — ABNORMAL HIGH (ref 4.0–10.5)
nRBC: 1.1 % — ABNORMAL HIGH (ref 0.0–0.2)

## 2020-10-04 LAB — BASIC METABOLIC PANEL
Anion gap: 12 (ref 5–15)
BUN: 115 mg/dL — ABNORMAL HIGH (ref 6–20)
CO2: 23 mmol/L (ref 22–32)
Calcium: 8.7 mg/dL — ABNORMAL LOW (ref 8.9–10.3)
Chloride: 113 mmol/L — ABNORMAL HIGH (ref 98–111)
Creatinine, Ser: 2.87 mg/dL — ABNORMAL HIGH (ref 0.61–1.24)
GFR, Estimated: 26 mL/min — ABNORMAL LOW (ref >60)
Glucose, Bld: 249 mg/dL — ABNORMAL HIGH (ref 70–99)
Potassium: 3.2 mmol/L — ABNORMAL LOW (ref 3.5–5.1)
Sodium: 148 mmol/L — ABNORMAL HIGH (ref 135–145)

## 2020-10-04 LAB — GLUCOSE, CAPILLARY
Glucose-Capillary: 196 mg/dL — ABNORMAL HIGH (ref 70–99)
Glucose-Capillary: 241 mg/dL — ABNORMAL HIGH (ref 70–99)
Glucose-Capillary: 276 mg/dL — ABNORMAL HIGH (ref 70–99)
Glucose-Capillary: 273 mg/dL — ABNORMAL HIGH (ref 70–99)
Glucose-Capillary: 215 mg/dL — ABNORMAL HIGH (ref 70–99)
Glucose-Capillary: 213 mg/dL — ABNORMAL HIGH (ref 70–99)

## 2020-10-04 LAB — MAGNESIUM
Magnesium: 2.5 mg/dL — ABNORMAL HIGH (ref 1.7–2.4)
Magnesium: 2.5 mg/dL — ABNORMAL HIGH (ref 1.7–2.4)

## 2020-10-04 LAB — POTASSIUM: Potassium: 3.4 mmol/L — ABNORMAL LOW (ref 3.5–5.1)

## 2020-10-04 LAB — TRIGLYCERIDES: Triglycerides: 740 mg/dL — ABNORMAL HIGH (ref <150)

## 2020-10-04 MED ORDER — EPINEPHRINE HCL 5 MG/250ML IV SOLN IN NS
INTRAVENOUS | Status: AC
  Administered 2020-10-04: 0.5 ug/min via INTRAVENOUS
  Filled 2020-10-04: qty 250

## 2020-10-04 MED ORDER — POLYETHYLENE GLYCOL 3350 17 G PO PACK: 17.0000 g | PACK | Freq: Every day | ORAL | Status: DC

## 2020-10-04 MED ORDER — FENTANYL 2500MCG IN NS 250ML (10MCG/ML) PREMIX INFUSION
50.0000 ug/h | INTRAVENOUS | Status: DC
  Administered 2020-10-05 – 2020-10-06 (×2): 50 ug/h via INTRAVENOUS
  Administered 2020-10-07: 100 ug/h via INTRAVENOUS
  Filled 2020-10-04 (×3): qty 250

## 2020-10-04 MED ORDER — ZINC CHLORIDE 1 MG/ML IV SOLN
INTRAVENOUS | Status: AC
  Filled 2020-10-04: qty 1238.4

## 2020-10-04 MED ORDER — EPINEPHRINE HCL 5 MG/250ML IV SOLN IN NS
0.5000 ug/min | INTRAVENOUS | Status: DC
  Administered 2020-10-05: 0.5 ug/min via INTRAVENOUS
  Administered 2020-10-06: 2 ug/min via INTRAVENOUS
  Filled 2020-10-04: qty 250

## 2020-10-04 MED ORDER — POTASSIUM CHLORIDE 10 MEQ/50ML IV SOLN
10.0000 meq | INTRAVENOUS | Status: AC
  Administered 2020-10-04 (×2): 10 meq via INTRAVENOUS
  Filled 2020-10-04 (×2): qty 50

## 2020-10-04 MED ORDER — POTASSIUM PHOSPHATES 15 MMOLE/5ML IV SOLN
30.0000 mmol | Freq: Once | INTRAVENOUS | Status: AC
  Administered 2020-10-04: 30 mmol via INTRAVENOUS
  Filled 2020-10-04: qty 10

## 2020-10-04 MED ORDER — FENTANYL CITRATE (PF) 100 MCG/2ML IJ SOLN
50.0000 ug | Freq: Once | INTRAMUSCULAR | Status: DC
  Filled 2020-10-04: qty 2

## 2020-10-04 MED ORDER — DOCUSATE SODIUM 50 MG/5ML PO LIQD: 100.0000 mg | Freq: Two times a day (BID) | ORAL | Status: DC

## 2020-10-04 MED ORDER — POTASSIUM CHLORIDE 10 MEQ/100ML IV SOLN
10.0000 meq | INTRAVENOUS | Status: AC
  Administered 2020-10-04 – 2020-10-05 (×2): 10 meq via INTRAVENOUS
  Filled 2020-10-04 (×2): qty 100

## 2020-10-04 MED ORDER — FENTANYL CITRATE (PF) 100 MCG/2ML IJ SOLN
50.0000 ug | Freq: Once | INTRAMUSCULAR | Status: AC
Start: 2020-10-04 — End: 2020-10-04
  Administered 2020-10-04: 50 ug via INTRAVENOUS
  Filled 2020-10-04: qty 2

## 2020-10-04 MED ORDER — METOPROLOL TARTRATE 5 MG/5ML IV SOLN
2.5000 mg | INTRAVENOUS | Status: AC | PRN
  Administered 2020-10-05 (×2): 2.5 mg via INTRAVENOUS
  Filled 2020-10-04 (×2): qty 5

## 2020-10-04 NOTE — Progress Notes (Signed)
eLink Physician-Brief Progress Note Patient Name: VOSHON PETRO DOB: Sep 09, 1970 MRN: [id number]   Date of Service  10/04/2020  HPI/Events of Note  Potassium at 3.2, PVC's.  eICU Interventions  Potassium chloride 10 meq every hour x 2 doses. Ordered intravenously.      Intervention Category Minor Interventions: Electrolytes abnormality - evaluation and management  Elmer Sow 10/04/2020, 11:30 PM

## 2020-10-04 NOTE — Procedures (Signed)
[facility] Tele[facility]urology EEG  HISTORY:  50 year old male presents with altered mentation.  INTRODUCTION:  A digital EEG was performed in the laboratory using the standard international 10/20 system of electrode placement in addition to one channel of EKG monitoring.  Hyperventilation and Photic Stimulation were not  performed.  This tracing captures the patient in the intubated and sedated states.     DESCRIPTION OF RECORD:  In the intubated state there is diffuse slowing of the background consisting of 6 Hz frequency activity, which is very low amplitude.  Continuous generalized slowing with intermixed frequencies in the theta and delta ranges is seen throughout the record. Excessive electrode artifact is seen in the right temporal lead which limits interpretation of this region.  Heart rate is 110 bpm.  IMPRESSION:  This is an abnormal adult EEG in the intubated and sedated state.  Of note, this is a low amplitude recording and excessive electrode artifact is seen in the right temporal lead which limits interpretation of this region..  The slowing of the background rhythm and continuous generalized slowing are consistent with an underlying moderate encephalopathy.  Alternatively, it could be due to the effect of sedative medications or bilateral cerebral dysfunction.  No epileptiform abnormalities or electroencephalographic seizures are identified in this record.  This does not rule out the diagnosis of epilepsy.  If clinically indicated a repeat EEG or 24 hour EEG may be helpful. Clinical correlation is recommended.

## 2020-10-04 NOTE — Progress Notes (Signed)
EEG complete - results pending 

## 2020-10-04 NOTE — Progress Notes (Signed)
PHARMACY - TOTAL PARENTERAL NUTRITION CONSULT NOTE  Indication:  Expected prolonged ileus  Patient Measurements: Height: 6\' 2"  (188 cm) Weight: 124.9 kg (275 lb 5.7 oz) IBW/kg (Calculated) : 82.2 TPN AdjBW (KG): 95.3 Body mass index is 35.35 kg/m.  Assessment:  56 YOM with history of mass adjacent to head of the pancreas in 2021.  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  Developed cholecystitis in Feb 2022 and had percutaneous cholecystectomy tube placement.  Presented this admission for neuroendocrine tumor resection with Whipple procedure, also had right total colectomy, colostomy and cholecystectomy on 09/24/2020. SMV was lacerated during procedure and repaired by bovine pericardial patch. Developed hemorrhagic shock with bile leak post-op and underwent placement of biliary T tube with abdominal washout on 3/15 PM. Abdomen remains open. Pharmacy consulted to manage TPN.  Glucose / Insulin: no hx DM, A1c 5.2%  CBGs previously controlled <180, now elevated 185-249 after starting stress steroids (HC last weaned to 50mg  IV q12h on 3/24). Utilized 32 units SSI used in last 24h + 40 units in the TPN bag + received levemir 10 units x 1 dose 3/25 AM Electrolytes: Na 143>148, K 3.5>3.2 (s/p K runs x 4 yesterday), CL high 113 / CO2 WNL, corrected Ca high ~10.7, Phos low 1.9; Mag high 2.5 Renal: AKI, SCr 2.87 - relatively stable today (BL ~1-1.2), BUN up to 115 Hepatic: LFTs mildly elevated - trend up, Tbili down to 4.2, TG back up to 740 (leveling off today, lipids removed from TPN again 3/25 with large trend up). Albumin 1.5, BL Prealbumin <5 Intake / Output; MIVF: UOP 0.9 ml/kg/hr, drain output 1573ml/24hrs; net +6.7L this admit (concentrate TPN per CCM) GI Imaging: none since TPN start GI Surgeries / Procedures: 3/14 Whipple procedure with en-bloc right hemicolectomy and end ileostomy, cholecystectomy 3/15 OR for washout, VAC placement 3/17 OR for washout, VAC  replacement.  Unlikely to close abdomen d/t edema - necrosis of the entire small bowel except for the PB limb and proximal 40-50cm of jejunum 3/19 OR for re-opening laparotomy, abdominal closure, found to have necrotic SB and deemed not survivable condition 3/21 OR for SB resection, take down of choledochojejunal anastomosis and pancreaticojejunal anastomosis, take down of duodenojejunal anastomosis, placement of externalized biliary drain / Stamm gastrostomy tube, intraabdominal washout and placement of intraperitoneal drains  Central access: R IJ CVC placed 3/15; changed to PICC placed 10/03/20 TPN start date: 09/16/2020  Nutritional Goals (RD recommendation on 3/24): 2800-3000 kCal, 185-210g AA, fluid >2L/day  Goal TPN is 90 ml/hr - will provide 185g AA, 583g CHO, for total 2726 kCal per day  Current Nutrition:  TPN; NPO  Plan:  -Continue concentrated TPN at goal rate of 90 ml/hr at 1800. Patient has been unable to tolerate lipids in TPN so far - removed again on 3/25 with TG up to >700. Watch trend closely. -Electrolytes in TPN: reduce Na to 20 mEq/L, increase K to 70mEq/L, remove Ca/Mag, increase Phos to 5 mmol/L, change Cl:Ac ratio to 1:2 -Give KPhos 19mmol IV x 1 + K runs x 2 -Add MVI. Remove standard trace elements and add back zinc 5mg , chromium 44mcg, selenium 84mcg (with previous Tbili elevated >6 and scleral icterus noted). Consider adding back full trace elements if normalizes. -Continue resistant SSI Q4H + decrease to 25 units regular insulin to TPN bag in anticipation of CBGs dropping again with stress steroids d/c'd 3/26 AM. Watch CBGs closely (increase of CHO in TPN starting 3/25 due to inability  to tolerate lipids) -F/U TPN labs, Surgery plans, GOC   [name], PharmD, BCPS Please check AMION for all [place] contact numbers Clinical Pharmacist 10/04/2020 7:32 AM

## 2020-10-04 NOTE — Progress Notes (Signed)
[name], MRN:  [id number], DOB:  31-May-1971, LOS: 12 ADMISSION DATE:  09/28/2020, CONSULTATION DATE:  10/04/20 REFERRING MD:  Anesthesia, CHIEF COMPLAINT:  Whipple, post-op shock   Brief History:  50 y.o. M with PMH of neuroendocrine tumor and underwent Whipple procedure 3/14 with intra-operative SMV laceration and repair by vascular with bovine pericardial patch.  Pt had worsening shock overnight and required three pressors despite massive transfusion protocol (received 150 blood products).  He was taken back to the OR for washout on 3/15 and returned to the ICU intubated.  Taken back 3/17 again found to have small bowel necrosis s/p resection.  Plans again to return 3/19 to OR.   Totality of small bowel has been resected. The case was discussed with [facility] who may consider eval for sm intestinal transplant pending clinical course.  Continuing aggressive supportive care.   Past Medical History:   has a past medical history of AICD (automatic cardioverter/defibrillator) present, Dyslipidemia, Heart failure with mildly reduced EF, History of kidney stones, HTN (hypertension), ventricular fibrillation, and Nonischemic cardiomyopathy.   Significant Hospital Events:  3/14: Admit to Surgery, to OR for whipple, SMV injury and repair, massive transfusion protocol overnight  3/15: Back to OR for washout, x2. IR for arteriogram. No major bleed source identified in OR, or arterial bleed w IR. Robust product resuscitation >75 products (+ TXA + novoseven) 3/16: off pressors, add'l 39 products overnight. Slowing resuscitation this morning with hemodynamic improvements and slowing drain output; OR x 2 - washout, biliary t tube, wound vac -- washout, wound vac, IR for arteriogram -- no arterial bleed for embolization 3/17:  Aggressive balanced transfusion continue overnight with marked hemodynamic improvement since yesterday evening. Off pressors x several hours, Wound vac output significantly  slowing (from 561ml q15-56min to 540ml q1hr+) and output is much thinner, Thin bloody secretions from mouth approx 225ml, Decreased RR from 22 to 18, Unable to tolerate NE- severe bradycardia 3/19 taken back to OR. Small bowel noted to be almost completely necrosed. Patient was closed with plans for goals of car discussion.  3/20 GOC family endorses full scope of treatment. Primary team discussed with [facility] the possibility of transfer for small bowel transplant. [facility] felt as though transfer would not change outcome. 3/21 Ongoing discussion with family and [facility]. Patient may be a candidate for transplant if he can stabilize post a small bowel resection. He was taken back to the OR and small bowel was resected.  3/23 weaning pressors. Versed off. Remains encephalopathic 3/24 off pressors. Low dose dilaudid gtt -- only weakly grimaces to pain. Changing sedation to precedex + PRN fentanyl. Long discussion with 2 brothers regarding clinical case -- tried to clarify that while the term "stable" has been used, in this instance is meaning that he has not declined from previous shift but is in fact still critically ill with multisystem organ dysfunction.  3/25 Cr and BUN increased. Off sedation   Consults:  PCCM VVS  IR  Palliative  Procedures:  3/15 ETT 3/15 R IJ cordis/ CVL >> 3/15 left aline >>  Significant Diagnostic Tests:  3/15 mesenteric arteriogram> no identified arterial source of bleed 3/18 Echo > LVEF 40-45%, Grade 1 DD  Micro Data:  3/15 MRSA>>negative 3/23 BCx>> (no growth < 24 hrs)   Antimicrobials:  Ancef 3/14 Flagyl 3/14 3/17 flagyl > 3/20 3/17 ceftriaxone > 3/18 vanc > 3/20  Zosyn 3/21 > Eraxis 3/23>   Interim History / Subjective:  No events, remains poorly  responsive off sedation but improving, occasional following commands per nursing. Excellent urine output.    Objective   Blood pressure (!) 150/63, pulse 79, temperature 99.68 F (37.6 C), resp. rate (!) 27,  height 6\' 2"  (1.88 m), weight 124.9 kg, SpO2 97 %.    Vent Mode: PRVC FiO2 (%):  [40 %] 40 % Set Rate:  [15 bmp-30 bmp] 15 bmp Vt Set:  [580 mL] 580 mL PEEP:  [5 cmH20] 5 cmH20 Pressure Support:  [5 cmH20] 5 cmH20 Plateau Pressure:  [20 cmH20-25 cmH20] 25 cmH20   Intake/Output Summary (Last 24 hours) at 10/04/2020 0758 Last data filed at 10/04/2020 0700 Gross per 24 hour  Intake 2694.32 ml  Output 4495 ml  Net -1800.68 ml   Filed Weights   10/06/2020 0639 10/01/20 1503  Weight: 95.3 kg 124.9 kg    Examination: Constitutional: no acute distress on vent  Eyes: pupils equal, not tracking Ears, nose, mouth, and throat: ETT minimal secretions Cardiovascular: RRR but lots of PVCs Respiratory: Scattered rhonci, triggering vent Gastrointestinal: multiple drains in place, dressing in place without strikethrough Skin: No rashes, normal turgor Ext: diffuse anasarca worst in scrotal area overall improving over last several days Neurologic: opens eyes for me but not following commands Psychiatric: RASS -2  WBC improved BUN/Cr up slightly TG up Sugars a bit better Fever curve seems better  Resolved Hospital Problem list   Hemorrhagic shock hypophosphatemia  Assessment & Plan:  MODS after complications from Whipple  S/p total SB resection  Septic shock- resolved Ischemic ATN worsening but slowly, no immediate indication for HD Metabolic encephalopathy hopefully lingering sedation effect, CT head neg Worsening transaminitis  Hypertriglyceridemia- worsening Hypokalemia stable Hyperglycemia improved Baseline NICCM with ICD in place   Continue vent support, VAP prevention bundle, mental status precludes extubation at present  Check spot EEG but suspect he will continue to improve neurologically  Hold on HD for now, starting this may make SB transplant candidacy tricky  Continue eraxis/zosyn x 7 days post resection  TPN insulin adjustments appreciated by pharmD  Keep an  eye on TG  Consider clonidine patch if BPs continue to be elevated  Guarded prognosis  Daily Goals Checklist  Pain/Anxiety/Delirium protocol (if indicated): off VAP protocol (if indicated): bundle in place.  DVT prophylaxis: SCDs Nutrition Status: TPN GI prophylaxis: Pantoprazole Urinary catheter: still needed 3/25 Central lines: PICC 3/25 Glucose control: -SSI + TPN Mobility/therapy needs: Bedrest Restraints: Restraint type:NA Reason for restraints:NA Daily labs: CBC, CMP daily Code Status: Partial - -no CV/DF (has an ICD however) Family Communication: per primary Disposition: ICU.   Goals of Care:  Last date of multidisciplinary goals of care discussion: 3/19 Family and staff present: Dr. Lynetta Mare, Ricky Ala and multiple family members Summary of discussion: Family wishes for aggressive care. The target is to stabilize and possibly transfer to [facility] for small intestine transplant evaluation   Code Status:  Partial - no CV/DF by [place] (has an ICD which is not to be turned off)   Patient critically ill due to respiratory failure Interventions to address this today ventilator titration Risk of deterioration without these interventions is high  I personally spent 38 minutes providing critical care not including any separately billable procedures  Erskine Emery MD [facility]  Prefer epic messenger for cross cover needs If after hours, please call E-link

## 2020-10-04 NOTE — Progress Notes (Signed)
[place] Progress Note Patient Name: LASHAN GLUTH DOB: 1970/12/18 MRN: [id number]   Date of Service  10/04/2020  HPI/Events of Note  Red alert Camera: Discussed with bed side RN.  Hypertensive and tachy 180, now 156.  In synchrony with vent. sats ok.  On versed gtt. Earlier fentanyl did help for tachy improvement.  S/p small bowel surgery/Neuroendocrine tumor related complications. Could be in pain.  eICU Interventions  - Lopressor 2.5 mg q5 mins x 2 dose for HR > 120. No hx of a fib. If recurring get EKG - start Fenta gtt for analgesia.      Intervention Category Intermediate Interventions: Arrhythmia - evaluation and management;Pain - evaluation and management;Hypertension - evaluation and management  Elmer Sow 10/04/2020, 11:58 PM

## 2020-10-04 NOTE — Progress Notes (Signed)
[facility] Progress Note Patient Name: [name] DOB: [birth year] MRN: [id number]   Date of Service  10/04/2020  HPI/Events of Note  Pt going tachy 160 and throwing a lot of PVC's.  Pt also has no pain meds. May be pain related as well.   Starting wake up. Not on pain meds. S/p Small bowel resection. On Vent. Off of pressors. Increasing BP also. Versed IV.  eICU Interventions  Fenta 50  Once IV stat K/po4 stat. If low replace     Intervention Category Intermediate Interventions: Arrhythmia - evaluation and management;Pain - evaluation and management  Elmer Sow 10/04/2020, 9:51 PM

## 2020-10-04 NOTE — Progress Notes (Signed)
5 Days Post-Op   Subjective/Chief Complaint: No acute changes overnight   Objective: Vital signs in last 24 hours: Temp:  [99.68 F (37.6 C)-101.12 F (38.4 C)] 99.86 F (37.7 C) (03/26 0800) Pulse Rate:  [79-103] 97 (03/26 0800) Resp:  [20-31] 25 (03/26 0800) BP: (116-173)/(53-136) 155/73 (03/26 0800) SpO2:  [90 %-100 %] 95 % (03/26 0800) Arterial Line BP: (100-174)/(45-75) 174/73 (03/26 0800) FiO2 (%):  [40 %] 40 % (03/26 0800) Last BM Date:  (PTA)  Intake/Output from previous day: 03/25 0701 - 03/26 0700 In: 2694.3 [I.V.:2196.1; IV Piggyback:498.2] Out: 4495 [Urine:2750; Drains:1745] Intake/Output this shift: Total I/O In: 102.4 [I.V.:90; IV Piggyback:12.4] Out: -   PE: General: intubated,  anasarca HEENT: OG and ETT in place. CV: RRR Resp: intubated, on vent Abdomen: Soft. Midline incision open at the skin, wound bed is clean and dry. RUQ biliary drain with bile. RUQ JPx2 with serosanguinous drainage. LUQ pancreatic stent with clear colorless fluid. G tube to gravity with minimal drainage. GU: foley in place, draining yellow urine Neuro: followed commands for RN this AM  Lab Results:  Recent Labs    10/03/20 1356 10/04/20 0521  WBC 27.1* 16.8*  HGB 9.3* 8.6*  HCT 28.9* 26.5*  PLT 255 259   BMET Recent Labs    10/03/20 0342 10/04/20 0521  NA 143 148*  K 3.5 3.2*  CL 111 113*  CO2 21* 23  GLUCOSE 229* 249*  BUN 106* 115*  CREATININE 2.82* 2.87*  CALCIUM 8.9 8.7*  Studies/Results: CT HEAD WO CONTRAST  Result Date: 10/03/2020 CLINICAL DATA:  Mental status changes. Patient not regaining consciousness. EXAM: CT HEAD WITHOUT CONTRAST TECHNIQUE: Contiguous axial images were obtained from the base of the skull through the vertex without intravenous contrast. COMPARISON:  None. FINDINGS: Brain: The brain shows a normal appearance without evidence of malformation, atrophy, old or acute small or large vessel infarction, mass lesion, hemorrhage, hydrocephalus or  extra-axial collection. Vascular: No hyperdense vessel. No evidence of atherosclerotic calcification. Skull: Normal.  No traumatic finding.  No focal bone lesion. Sinuses/Orbits: Scattered opacified ethmoid air cells. Some fluid in the sphenoid sinus. Orbits negative. Other: None significant IMPRESSION: 1. Normal appearance of the brain itself. 2. Scattered opacified ethmoid air cells. Some fluid in the sphenoid sinus. Electronically Signed   By: Nelson Chimes M.D.   On: 10/03/2020 14:30   DG CHEST PORT 1 VIEW  Result Date: 10/03/2020 CLINICAL DATA:  Intubation EXAM: PORTABLE CHEST 1 VIEW COMPARISON:  Radiograph 09/26/2020 FINDINGS: Battery pack overlies left chest wall with pacer/defibrillator lead directed towards the cardiac apex. Right IJ vascular sheath with catheter tip in the mid SVC. Transesophageal tube tip and side port terminate below the margins of imaging beyond the GE junction. Telemetry leads and external support devices overlie the chest. Diffuse pulmonary vascular congestion. Mixed hazy and patchy opacities are present throughout the lungs which could reflect edema or airspace disease with more confluent opacity in the retrocardiac space. Left effusion not excluded. No visible right effusion or pneumothorax. Additional areas of more bandlike opacity likely reflecting subsegmental atelectasis. Prominence of the cardiac silhouette may be related to portable technique and low volumes. No other acute osseous or soft tissue abnormality. IMPRESSION: 1. Diffuse hazy and patchy opacities throughout the lungs with more confluent opacity in the retrocardiac space. Could reflect atelectasis, edema and/or airspace disease with layering effusion. 2. Additional areas of subsegmental atelectasis. 3. Support devices as above. Electronically Signed   By: Elwin Sleight.D.  On: 10/03/2020 05:52   [place] EKG SITE RITE  Result Date: 10/02/2020 If Site Rite image not attached, placement could not be confirmed due to  current cardiac rhythm.   Anti-infectives: Anti-infectives (From admission, onward)   Start     Dose/Rate Route Frequency Ordered Stop   10/01/20 1100  anidulafungin (ERAXIS) 100 mg in sodium chloride 0.9 % 100 mL IVPB       "Followed by" Linked Group Details   100 mg 78 mL/hr over 100 Minutes Intravenous Every 24 hours 09/30/20 1014     09/30/20 1100  anidulafungin (ERAXIS) 200 mg in sodium chloride 0.9 % 200 mL IVPB       "Followed by" Linked Group Details   200 mg 78 mL/hr over 200 Minutes Intravenous  Once 09/30/20 1014 09/30/20 1516   10/06/2020 1600  vancomycin (VANCOREADY) IVPB 1000 mg/200 mL  Status:  Discontinued        1,000 mg 200 mL/hr over 60 Minutes Intravenous Every 24 hours 10/09/2020 1137 10/03/2020 1510   09/16/2020 1400  piperacillin-tazobactam (ZOSYN) IVPB 3.375 g        3.375 g 12.5 mL/hr over 240 Minutes Intravenous Every 8 hours 09/30/2020 1322     09/15/2020 1430  vancomycin (VANCOREADY) IVPB 1750 mg/350 mL  Status:  Discontinued        1,750 mg 175 mL/hr over 120 Minutes Intravenous Every 24 hours 09/30/2020 1349 09/20/2020 1137   09/26/20 1130  vancomycin (VANCOREADY) IVPB 2000 mg/400 mL        2,000 mg 200 mL/hr over 120 Minutes Intravenous  Once 09/26/20 1031 09/26/20 1411   09/26/20 1030  vancomycin variable dose per unstable renal function (pharmacist dosing)  Status:  Discontinued         Does not apply See admin instructions 09/26/20 1031 09/28/20 0816   09/21/2020 2200  metroNIDAZOLE (FLAGYL) IVPB 500 mg  Status:  Discontinued        500 mg 100 mL/hr over 60 Minutes Intravenous Every 8 hours 09/21/2020 1820 09/29/2020 1322   09/19/2020 2000  cefTRIAXone (ROCEPHIN) 2 g in sodium chloride 0.9 % 100 mL IVPB  Status:  Discontinued        2 g 200 mL/hr over 30 Minutes Intravenous Every 24 hours 09/21/2020 1820 10/02/2020 1322   09/20/2020 1330  vancomycin (VANCOCIN) 2,250 mg in sodium chloride 0.9 % 500 mL IVPB  Status:  Discontinued        2,250 mg 250 mL/hr over 120 Minutes  Intravenous Every 48 hours 09/19/2020 1228 09/28/2020 1320   10/05/2020 1300  cefTRIAXone (ROCEPHIN) 2 g in sodium chloride 0.9 % 100 mL IVPB  Status:  Discontinued        2 g 200 mL/hr over 30 Minutes Intravenous Every 24 hours 09/30/2020 1208 10/03/2020 1320   10/08/2020 1300  metroNIDAZOLE (FLAGYL) IVPB 500 mg  Status:  Discontinued        500 mg 100 mL/hr over 60 Minutes Intravenous Every 8 hours 09/24/2020 1208 09/09/2020 1320   09/11/2020 0915  metroNIDAZOLE (FLAGYL) IVPB 500 mg  Status:  Discontinued        500 mg 100 mL/hr over 60 Minutes Intravenous To Surgery 09/17/2020 0903 09/21/2020 0951   09/17/2020 0630  ceFAZolin (ANCEF) IVPB 2g/100 mL premix       "And" Linked Group Details   2 g 200 mL/hr over 30 Minutes Intravenous On call to O.R. 09/13/2020 9147 10/07/2020 1215   10/04/2020 0630  metroNIDAZOLE (FLAGYL) IVPB 500 mg       "  And" Linked Group Details   500 mg 100 mL/hr over 60 Minutes Intravenous On call to O.R. 09/12/2020 5615 09/19/2020 0820      Assessment/Plan: 50 yo male with a neuroendocrine tumor of the head of the pancreas, POD12 s/p right hemicolectomy, end ileostomy, Whipple and patch angioplasty repair of SMV. Complicated by postoperative hemorrhagic shock with DIC with extensive small bowel necrosis. All of small bowel has been resected, with external drainage of the bile duct, pancreas and stomach. - TPN - Continue antibiotics. Patient has ongoing sepsis. - Wound care: saline wet-to-dry dressings BID to midline incision and prior ostomy site - Family not at bedside this morning. Prognosis remains very guarded.  Will likely need to consider dialysis if uremia continues to rise. Will also need to discuss trach in the next few days as he is not currently close to liberating from the ventilator. - VTE: SQH - Dispo: ICU   LOS: 12 days    Ralene Ok 10/04/2020

## 2020-10-05 DIAGNOSIS — J9601 Acute respiratory failure with hypoxia: Secondary | ICD-10-CM | POA: Diagnosis not present

## 2020-10-05 DIAGNOSIS — Z9289 Personal history of other medical treatment: Secondary | ICD-10-CM | POA: Diagnosis not present

## 2020-10-05 DIAGNOSIS — D3A8 Other benign neuroendocrine tumors: Secondary | ICD-10-CM | POA: Diagnosis not present

## 2020-10-05 LAB — CBC
HCT: 26.8 % — ABNORMAL LOW (ref 39.0–52.0)
Hemoglobin: 8.6 g/dL — ABNORMAL LOW (ref 13.0–17.0)
MCH: 29.4 pg (ref 26.0–34.0)
MCHC: 32.1 g/dL (ref 30.0–36.0)
MCV: 91.5 fL (ref 80.0–100.0)
Platelets: 253 10*3/uL (ref 150–400)
RBC: 2.93 MIL/uL — ABNORMAL LOW (ref 4.22–5.81)
RDW: 20.2 % — ABNORMAL HIGH (ref 11.5–15.5)
WBC: 15.1 10*3/uL — ABNORMAL HIGH (ref 4.0–10.5)
nRBC: 1 % — ABNORMAL HIGH (ref 0.0–0.2)

## 2020-10-05 LAB — GLUCOSE, CAPILLARY
Glucose-Capillary: 190 mg/dL — ABNORMAL HIGH (ref 70–99)
Glucose-Capillary: 204 mg/dL — ABNORMAL HIGH (ref 70–99)
Glucose-Capillary: 208 mg/dL — ABNORMAL HIGH (ref 70–99)
Glucose-Capillary: 210 mg/dL — ABNORMAL HIGH (ref 70–99)
Glucose-Capillary: 223 mg/dL — ABNORMAL HIGH (ref 70–99)
Glucose-Capillary: 227 mg/dL — ABNORMAL HIGH (ref 70–99)

## 2020-10-05 LAB — COMPREHENSIVE METABOLIC PANEL
ALT: 187 U/L — ABNORMAL HIGH (ref 0–44)
AST: 191 U/L — ABNORMAL HIGH (ref 15–41)
Albumin: 1.5 g/dL — ABNORMAL LOW (ref 3.5–5.0)
Alkaline Phosphatase: 83 U/L (ref 38–126)
Anion gap: 9 (ref 5–15)
BUN: 126 mg/dL — ABNORMAL HIGH (ref 6–20)
CO2: 23 mmol/L (ref 22–32)
Calcium: 8.5 mg/dL — ABNORMAL LOW (ref 8.9–10.3)
Chloride: 118 mmol/L — ABNORMAL HIGH (ref 98–111)
Creatinine, Ser: 3.21 mg/dL — ABNORMAL HIGH (ref 0.61–1.24)
GFR, Estimated: 23 mL/min — ABNORMAL LOW (ref >60)
Glucose, Bld: 230 mg/dL — ABNORMAL HIGH (ref 70–99)
Potassium: 3.4 mmol/L — ABNORMAL LOW (ref 3.5–5.1)
Sodium: 150 mmol/L — ABNORMAL HIGH (ref 135–145)
Total Bilirubin: 4.7 mg/dL — ABNORMAL HIGH (ref 0.3–1.2)
Total Protein: 5.1 g/dL — ABNORMAL LOW (ref 6.5–8.1)

## 2020-10-05 LAB — POTASSIUM: Potassium: 3.9 mmol/L (ref 3.5–5.1)

## 2020-10-05 LAB — PHOSPHORUS: Phosphorus: 2.5 mg/dL (ref 2.5–4.6)

## 2020-10-05 LAB — TRIGLYCERIDES: Triglycerides: 631 mg/dL — ABNORMAL HIGH (ref <150)

## 2020-10-05 LAB — MAGNESIUM: Magnesium: 2.6 mg/dL — ABNORMAL HIGH (ref 1.7–2.4)

## 2020-10-05 MED ORDER — DEXMEDETOMIDINE HCL IN NACL 400 MCG/100ML IV SOLN
0.4000 ug/kg/h | INTRAVENOUS | Status: AC
  Administered 2020-10-05 – 2020-10-06 (×4): 0.4 ug/kg/h via INTRAVENOUS
  Administered 2020-10-06: 0.8 ug/kg/h via INTRAVENOUS
  Administered 2020-10-07: 0.4 ug/kg/h via INTRAVENOUS
  Administered 2020-10-07: 0.3 ug/kg/h via INTRAVENOUS
  Administered 2020-10-08: 0.4 ug/kg/h via INTRAVENOUS
  Filled 2020-10-05 (×9): qty 100

## 2020-10-05 MED ORDER — ZINC CHLORIDE 1 MG/ML IV SOLN
INTRAVENOUS | Status: AC
  Filled 2020-10-05: qty 1238.4

## 2020-10-05 MED ORDER — POTASSIUM CHLORIDE 10 MEQ/100ML IV SOLN
10.0000 meq | INTRAVENOUS | Status: AC
  Administered 2020-10-05 (×2): 10 meq via INTRAVENOUS
  Filled 2020-10-05 (×2): qty 100

## 2020-10-05 MED ORDER — FENTANYL CITRATE (PF) 100 MCG/2ML IJ SOLN
50.0000 ug | INTRAMUSCULAR | Status: DC | PRN
  Administered 2020-10-05 – 2020-10-08 (×3): 50 ug via INTRAVENOUS
  Filled 2020-10-05 (×3): qty 2

## 2020-10-05 NOTE — Progress Notes (Signed)
eLink Physician-Brief Progress Note Patient Name: [name] DOB: [birth year] MRN: [id number]   Date of Service  10/05/2020  HPI/Events of Note  Received request for prn pain medication Was on Fentanyl drip last night, off this morning Seen adequately sedated on Precedex BP 125/56  HR 68  eICU Interventions  Fentanyl 50 mcg IV q 4 prn ordered     Intervention Category Intermediate Interventions: Pain - evaluation and management  Shona Needles [place] 10/05/2020, 8:16 PM

## 2020-10-05 NOTE — Progress Notes (Signed)
PHARMACY - TOTAL PARENTERAL NUTRITION CONSULT NOTE  Indication:  Expected prolonged ileus  Patient Measurements: Height: 6\' 2"  (188 cm) Weight: 124.9 kg (275 lb 5.7 oz) IBW/kg (Calculated) : 82.2 TPN AdjBW (KG): 95.3 Body mass index is 35.35 kg/m.  Assessment:  54 YOM with history of mass adjacent to head of the pancreas in 2021.  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  Developed cholecystitis in Feb 2022 and had percutaneous cholecystectomy tube placement.  Presented this admission for neuroendocrine tumor resection with Whipple procedure, also had right total colectomy, colostomy and cholecystectomy on 10/06/2020. SMV was lacerated during procedure and repaired by bovine pericardial patch. Developed hemorrhagic shock with bile leak post-op and underwent placement of biliary T tube with abdominal washout on 3/15 PM. Abdomen remains open. Pharmacy consulted to manage TPN.  Glucose / Insulin: no hx DM, A1c 5.2%. CBGs previously controlled <180, now elevated 190-230s after starting stress steroids (HC d/c'd 3/26). Utilized 46 units SSI used in last 24h + 25 units in the TPN bag Electrolytes: Na up to 150, K up to 3.4 (s/p K runs x 4 total yesterday), CL high 118 / CO2 WNL, corrected Ca high ~10.5, Phos up to 2.5 (s/p KPhos 86mmol x 1 yesterday); Mag high 2.6 Renal: AKI, SCr up to 3.21, (BL ~1-1.2), BUN up to 126 Hepatic: LFTs moderately elevated - trend up, Tbili up to 4.7, TG peaked at 740, now back down to 631 (lipids removed from TPN again 3/25). Albumin 1.5, BL Prealbumin <5 Intake / Output; MIVF: UOP 1.1 ml/kg/hr, drain output 171ml/24hrs; net +4.8L this admit (concentrate TPN per CCM) GI Imaging: none since TPN start GI Surgeries / Procedures: 3/14 Whipple procedure with en-bloc right hemicolectomy and end ileostomy, cholecystectomy 3/15 OR for washout, VAC placement 3/17 OR for washout, VAC replacement.  Unlikely to close abdomen d/t edema - necrosis of the  entire small bowel except for the PB limb and proximal 40-50cm of jejunum 3/19 OR for re-opening laparotomy, abdominal closure, found to have necrotic SB and deemed not survivable condition 3/21 OR for SB resection, take down of choledochojejunal anastomosis and pancreaticojejunal anastomosis, take down of duodenojejunal anastomosis, placement of externalized biliary drain / Stamm gastrostomy tube, intraabdominal washout and placement of intraperitoneal drains  Central access: R IJ CVC placed 3/15; changed to PICC placed 10/03/20 TPN start date: 09/13/2020  Nutritional Goals (RD recommendation on 3/24): 2800-3000 kCal, 185-210g AA, fluid >2L/day  Goal TPN is 90 ml/hr - will provide 185g AA, 583g CHO, for total 2726 kCal per day  Current Nutrition:  TPN; NPO  Plan:  -Continue concentrated TPN at goal rate of 90 ml/hr at 1800. Patient has been unable to tolerate lipids in TPN so far - removed again on 3/25 with TG up to >700. Watch trend closely. -Electrolytes in TPN: increase K to 30 mEq/L, remove Na/Ca/Mag, Phos 5 mmol/L, max acetate -K runs x 2 already ordered per MD -Add MVI. Remove standard trace elements and add back zinc 5mg , chromium 35mcg, selenium 30mcg (with previous Tbili elevated >6 and scleral icterus noted). Consider removing chromium/selenium with renal insufficiency if doesn't improve. -Continue resistant SSI Q4H + increase back to 40 units regular insulin to TPN bag (reduced 3/26 in anticipation of CBGs dropping again with stress steroids d/c'd but have remained high-stable). Watch CBGs closely for further adjustments - note, increase of CHO in TPN starting 3/25 due to inability to tolerate lipids. -F/U TPN labs, Surgery plans, GOC  Arturo Morton, PharmD, BCPS Please check AMION for all [place] contact numbers Clinical Pharmacist 10/05/2020 8:25 AM

## 2020-10-05 NOTE — Progress Notes (Signed)
[facility] Progress Note Patient Name: [name] DOB: June 27, 1971 MRN: [id number]   Date of Service  10/05/2020  HPI/Events of Note  K+ 3.4 Crt 3.21 On TPN  eICU Interventions  IV  20 meq potassium replacement ordered.      Intervention Category Intermediate Interventions: Electrolyte abnormality - evaluation and management  Elmer Sow 10/05/2020, 5:48 AM

## 2020-10-05 NOTE — Progress Notes (Signed)
[name], MRN:  [id number], DOB:  1971/04/08, LOS: 31 ADMISSION DATE:  10/01/2020, CONSULTATION DATE:  10/05/20 REFERRING MD:  Anesthesia, CHIEF COMPLAINT:  Whipple, post-op shock   Brief History:  50 y.o. M with PMH of neuroendocrine tumor and underwent Whipple procedure 3/14 with intra-operative SMV laceration and repair by vascular with bovine pericardial patch.  Pt had worsening shock overnight and required three pressors despite massive transfusion protocol (received 150 blood products).  He was taken back to the OR for washout on 3/15 and returned to the ICU intubated.  Taken back 3/17 again found to have small bowel necrosis s/p resection.  Plans again to return 3/19 to OR.   Totality of small bowel has been resected. The case was discussed with [facility] who may consider eval for sm intestinal transplant pending clinical course.  Continuing aggressive supportive care.   Past Medical History:   has a past medical history of AICD (automatic cardioverter/defibrillator) present, Dyslipidemia, Heart failure with mildly reduced EF, History of kidney stones, HTN (hypertension), ventricular fibrillation, and Nonischemic cardiomyopathy.   Significant Hospital Events:  3/14: Admit to Surgery, to OR for whipple, SMV injury and repair, massive transfusion protocol overnight  3/15: Back to OR for washout, x2. IR for arteriogram. No major bleed source identified in OR, or arterial bleed w IR. Robust product resuscitation >75 products (+ TXA + novoseven) 3/16: off pressors, add'l 39 products overnight. Slowing resuscitation this morning with hemodynamic improvements and slowing drain output; OR x 2 - washout, biliary t tube, wound vac -- washout, wound vac, IR for arteriogram -- no arterial bleed for embolization 3/17:  Aggressive balanced transfusion continue overnight with marked hemodynamic improvement since yesterday evening. Off pressors x several hours, Wound vac output significantly  slowing (from 529ml q15-34min to 570ml q1hr+) and output is much thinner, Thin bloody secretions from mouth approx 237ml, Decreased RR from 22 to 18, Unable to tolerate NE- severe bradycardia 3/19 taken back to OR. Small bowel noted to be almost completely necrosed. Patient was closed with plans for goals of car discussion.  3/20 GOC family endorses full scope of treatment. Primary team discussed with [facility] the possibility of transfer for small bowel transplant. [facility] felt as though transfer would not change outcome. 3/21 Ongoing discussion with family and [facility]. Patient may be a candidate for transplant if he can stabilize post a small bowel resection. He was taken back to the OR and small bowel was resected.  3/23 weaning pressors. Versed off. Remains encephalopathic 3/24 off pressors. Low dose dilaudid gtt -- only weakly grimaces to pain. Changing sedation to precedex + PRN fentanyl. Long discussion with 2 brothers regarding clinical case -- tried to clarify that while the term "stable" has been used, in this instance is meaning that he has not declined from previous shift but is in fact still critically ill with multisystem organ dysfunction.  3/25 Cr and BUN increased. Off sedation  3/26 Pressors off, CT head benign, BUN/Cr continue to creep up 3/27 Tachycardia/HTN overnight improved with fentanyl gtt  Consults:  PCCM VVS  IR  Palliative  Procedures:   Patient Lines/Drains/Airways Status    Active Line/Drains/Airways    Name Placement date Placement time Site Days   Arterial Line 09/26/2020 Left Brachial 10/04/2020  0758  Brachial  8   PICC Triple Lumen 10/03/20 PICC Right Brachial 41 cm 0 cm 10/03/20  1043  - 2   Closed System Drain 3 Left;Medial Abdomen Other (Comment) 5 Fr. 10/07/2020  1347  Abdomen  13   Closed System Drain Right;Anterior RLQ Bulb (JP) 19 Fr. 09/19/2020  1437  RLQ  6   Closed System Drain Right;Anterior RUQ Bulb (JP) 19 Fr. 09/18/2020  1438  RUQ  6   Closed System Drain Right   -  -  -  -   NG/OG Tube Orogastric Center mouth 09/24/20  [place] mouth  11   Gastrostomy/Enterostomy Gastrostomy 20 Fr. LUQ 10/09/2020  1433  LUQ  6   Urethral Catheter Budd Palmer, RN Temperature probe 16 Fr. 09/24/20  1051  Temperature probe  11   Airway 8 mm 10/04/2020  0752  - 12   Incision (Closed) 10/03/2020 Groin Right 09/17/2020  1844  - 12   Incision (Closed) 09/12/2020 Abdomen Other (Comment) 09/12/2020  1434  - 6           Significant Diagnostic Tests:  See radiology tab  Micro Data:  See micro tab  Antimicrobials:   Zosyn 3/21 > Eraxis 3/23>   Interim History / Subjective:  Started on fentanyl overnight for tachycardia and hypertension. Febrile this AM. Poorly responsive on vent. Continues to auto-diurese   Objective   Blood pressure (!) 169/77, pulse 99, temperature (!) 100.94 F (38.3 C), resp. rate (!) 23, height 6\' 2"  (1.88 m), weight 124.9 kg, SpO2 96 %.    Vent Mode: PSV FiO2 (%):  [40 %-45 %] 40 % Set Rate:  [15 bmp] 15 bmp Vt Set:  [580 mL] 580 mL PEEP:  [5 cmH20-8 cmH20] 8 cmH20 Pressure Support:  [8 cmH20] 8 cmH20 Plateau Pressure:  [21 cmH20-25 cmH20] 23 cmH20   Intake/Output Summary (Last 24 hours) at 10/05/2020 0715 Last data filed at 10/05/2020 0700 Gross per 24 hour  Intake 3458.53 ml  Output 5070 ml  Net -1611.47 ml   Filed Weights   09/15/2020 0639 10/01/20 1503  Weight: 95.3 kg 124.9 kg    Examination: Constitutional: ill appearing man on vent  Eyes: Pupils small but reactive Ears, nose, mouth, and throat: ETT in place, minimal secretions Cardiovascular: RRR, ext warm Respiratory: scattered mechanical breath sounds, triggering vent Gastrointestinal: multiple drains in place (see LDA tab) Skin: No rashes, normal turgor Neurologic: I got him to move RLE to command and try to open eyes but he would not do much else, does withdraw to pain Psychiatric: cannot assess Ext: improving anasarca  Sodium up K slightly low AST/ALT up CBC  stable   Resolved Hospital Problem list   Hemorrhagic shock hypophosphatemia  Assessment & Plan:  -MODS after complications from Whipple/colectomy for neuroendorine pancreatic tumor -S/p total SB resectionrelated to complications from hemorrhagic shock, ischemia after Whipple -Septic shock- resolved -Ischemic ATN worsening but slowly, no immediate indication for HD -Metabolic encephalopathy likely lingering sedation effect, CT head neg; EEG without obvious seizures -Worsening transaminitis  -Hypertriglyceridemia- stable related to lack of small bowel -Hypokalemia stable -Hyperglycemia stable -Baseline NICCM with ICD in place   Continue vent support, VAP prevention bundle, mental status precludes extubation at present  Continue eraxis/zosyn with duration TBD  TPN insulin adjustments appreciated by pharmD  Keep an eye on TG  Bps and HR respond to sedation level, need to find happy balance between this and getting neuro exam; will try precedex rather than fentanyl  Guarded prognosis  Overall will not offer HD or trach at present; no urgent indications for either; he is making good urine; K looks okay, volume status improving, acid-base status stable.  I worry if  we do trach and HD he will be not have a destination.  Discussed with Dr. Rosendo Gros and we agree to defer this to Drs. Bobbye Morton and Zenia Resides who are more familiar with the discussions with [facility] transplant team.  As Dr. Bobbye Morton is critical care trained and coming on service tomorrow morning, PCCM will be available PRN until Dr. Zenia Resides is back.  Daily Goals Checklist  Pain/Anxiety/Delirium protocol (if indicated): off VAP protocol (if indicated): bundle in place.  DVT prophylaxis: Heparin Nutrition Status: TPN GI prophylaxis: Pantoprazole Urinary catheter: still needed 3/25 Central lines: PICC 3/25 Glucose control: -SSI + TPN Mobility/therapy needs: Bedrest Restraints: Restraint type:NA Reason for restraints:NA Daily labs:  CBC, CMP daily Code Status: Partial - -no CV/DF (has an ICD however) Family Communication: per primary Disposition: ICU.   Goals of Care:  Last date of multidisciplinary goals of care discussion: 3/19 Family and staff present: Dr. Lynetta Mare, Ricky Ala and multiple family members Summary of discussion: Family wishes for aggressive care. The target is to stabilize and possibly transfer to [facility] for small intestine transplant evaluation   Code Status:  Partial - no CV/DF by [place] (has an ICD which is not to be turned off)  Patient critically ill due to respiratory failure Interventions to address this today ventilator titration Risk of deterioration without these interventions is high  I personally spent 35 minutes providing critical care not including any separately billable procedures  Erskine Emery MD [facility]  Prefer epic messenger for cross cover needs If after hours, please call E-link

## 2020-10-05 NOTE — Progress Notes (Signed)
Changed A-line dressing. Changed biopatch on JP drains and biliary drain. Changed split gauze on G tube. Changed wet-to-dry dressing on abdomen. Placed foam on staple site that is weeping. Placed foam on R thigh broken blister.

## 2020-10-05 NOTE — Progress Notes (Signed)
   10/05/20 0000  Provider Notification  Provider Name/Title Dr. Prudencio Burly  Date Provider Notified 10/05/20  Time Provider Notified 0000  Notification Type Call  Notification Reason Change in status (HR 180 sustained)  Provider response See new orders  Date of Provider Response 10/05/20  Time of Provider Response 0000

## 2020-10-05 NOTE — Progress Notes (Signed)
6 Days Post-Op   Subjective/Chief Complaint: Pt with some tachycardia overnight No other acute events  Objective: Vital signs in last 24 hours: Temp:  [99.68 F (37.6 C)-101.66 F (38.7 C)] 100.94 F (38.3 C) (03/27 0700) Pulse Rate:  [89-166] 104 (03/27 0700) Resp:  [22-30] 22 (03/27 0700) BP: (115-172)/(53-103) 172/96 (03/27 0700) SpO2:  [89 %-97 %] 96 % (03/27 0700) Arterial Line BP: (101-174)/(42-80) 144/68 (03/27 0700) FiO2 (%):  [40 %-45 %] 40 % (03/27 0704) Last BM Date:  (PTA)  Intake/Output from previous day: 03/26 0701 - 03/27 0700 In: 3458.5 [I.V.:2275.3; IV Piggyback:1183.3] Out: 5070 [Urine:3400; Drains:1670] Intake/Output this shift: No intake/output data recorded.  PE: General: intubated, anasarca HEENT: OG and ETT in place. CV: RRR Resp: intubated, on vent Abdomen: Soft. Midline incision open at the skin, wound bed is clean and dry. RUQ biliary drain with bile. RUQ JPx2 with serosanguinous drainage. LUQ pancreatic stent with clear colorless fluid. G tube to gravity with minimal drainage. GU: foley in place, draining yellow urine Neuro: spot eye opening overnight  Lab Results:  Recent Labs    10/04/20 0521 10/05/20 0407  WBC 16.8* 15.1*  HGB 8.6* 8.6*  HCT 26.5* 26.8*  PLT 259 253   BMET Recent Labs    10/04/20 0521 10/04/20 2226 10/05/20 0407  NA 148*  --  150*  K 3.2* 3.4* 3.4*  CL 113*  --  118*  CO2 23  --  23  GLUCOSE 249*  --  230*  BUN 115*  --  126*  CREATININE 2.87*  --  3.21*  CALCIUM 8.7*  --  8.5*   PT/INR No results for input(s): LABPROT, INR in the last 72 hours. ABG No results for input(s): PHART, HCO3 in the last 72 hours.  Invalid input(s): PCO2, PO2  Studies/Results: CT HEAD WO CONTRAST  Result Date: 10/03/2020 CLINICAL DATA:  Mental status changes. Patient not regaining consciousness. EXAM: CT HEAD WITHOUT CONTRAST TECHNIQUE: Contiguous axial images were obtained from the base of the skull through the vertex  without intravenous contrast. COMPARISON:  None. FINDINGS: Brain: The brain shows a normal appearance without evidence of malformation, atrophy, old or acute small or large vessel infarction, mass lesion, hemorrhage, hydrocephalus or extra-axial collection. Vascular: No hyperdense vessel. No evidence of atherosclerotic calcification. Skull: Normal.  No traumatic finding.  No focal bone lesion. Sinuses/Orbits: Scattered opacified ethmoid air cells. Some fluid in the sphenoid sinus. Orbits negative. Other: None significant IMPRESSION: 1. Normal appearance of the brain itself. 2. Scattered opacified ethmoid air cells. Some fluid in the sphenoid sinus. Electronically Signed   By: Nelson Chimes M.D.   On: 10/03/2020 14:30   EEG adult  Result Date: 10/04/2020 Theodosia Blender, MD     10/04/2020 12:11 PM [facility] EEG HISTORY:  50 year old male presents with altered mentation. INTRODUCTION:  A digital EEG was performed in the laboratory using the standard international 10/20 system of electrode placement in addition to one channel of EKG monitoring.  Hyperventilation and Photic Stimulation were not  performed.  This tracing captures the patient in the intubated and sedated states.  DESCRIPTION OF RECORD:  In the intubated state there is diffuse slowing of the background consisting of 6 Hz frequency activity, which is very low amplitude.  Continuous generalized slowing with intermixed frequencies in the theta and delta ranges is seen throughout the record. Excessive electrode artifact is seen in the right temporal lead which limits interpretation of this region. Heart rate is 110  bpm. IMPRESSION:  This is an abnormal adult EEG in the intubated and sedated state.  Of note, this is a low amplitude recording and excessive electrode artifact is seen in the right temporal lead which limits interpretation of this region..  The slowing of the background rhythm and continuous generalized slowing are  consistent with an underlying moderate encephalopathy.  Alternatively, it could be due to the effect of sedative medications or bilateral cerebral dysfunction.  No epileptiform abnormalities or electroencephalographic seizures are identified in this record.  This does not rule out the diagnosis of epilepsy.  If clinically indicated a repeat EEG or 24 hour EEG may be helpful. Clinical correlation is recommended.    Anti-infectives: Anti-infectives (From admission, onward)   Start     Dose/Rate Route Frequency Ordered Stop   10/01/20 1100  anidulafungin (ERAXIS) 100 mg in sodium chloride 0.9 % 100 mL IVPB       "Followed by" Linked Group Details   100 mg 78 mL/hr over 100 Minutes Intravenous Every 24 hours 09/30/20 1014     09/30/20 1100  anidulafungin (ERAXIS) 200 mg in sodium chloride 0.9 % 200 mL IVPB       "Followed by" Linked Group Details   200 mg 78 mL/hr over 200 Minutes Intravenous  Once 09/30/20 1014 09/30/20 1516   10/02/2020 1600  vancomycin (VANCOREADY) IVPB 1000 mg/200 mL  Status:  Discontinued        1,000 mg 200 mL/hr over 60 Minutes Intravenous Every 24 hours 10/03/2020 1137 09/12/2020 1510   09/12/2020 1400  piperacillin-tazobactam (ZOSYN) IVPB 3.375 g        3.375 g 12.5 mL/hr over 240 Minutes Intravenous Every 8 hours 10/04/2020 1322     09/26/2020 1430  vancomycin (VANCOREADY) IVPB 1750 mg/350 mL  Status:  Discontinued        1,750 mg 175 mL/hr over 120 Minutes Intravenous Every 24 hours 09/26/2020 1349 10/09/2020 1137   09/26/20 1130  vancomycin (VANCOREADY) IVPB 2000 mg/400 mL        2,000 mg 200 mL/hr over 120 Minutes Intravenous  Once 09/26/20 1031 09/26/20 1411   09/26/20 1030  vancomycin variable dose per unstable renal function (pharmacist dosing)  Status:  Discontinued         Does not apply See admin instructions 09/26/20 1031 09/28/20 0816   10/06/2020 2200  metroNIDAZOLE (FLAGYL) IVPB 500 mg  Status:  Discontinued        500 mg 100 mL/hr over 60 Minutes Intravenous Every 8  hours 09/09/2020 1820 10/01/2020 1322   09/15/2020 2000  cefTRIAXone (ROCEPHIN) 2 g in sodium chloride 0.9 % 100 mL IVPB  Status:  Discontinued        2 g 200 mL/hr over 30 Minutes Intravenous Every 24 hours 09/18/2020 1820 09/26/2020 1322   09/28/2020 1330  vancomycin (VANCOCIN) 2,250 mg in sodium chloride 0.9 % 500 mL IVPB  Status:  Discontinued        2,250 mg 250 mL/hr over 120 Minutes Intravenous Every 48 hours 09/18/2020 1228 10/03/2020 1320   10/02/2020 1300  cefTRIAXone (ROCEPHIN) 2 g in sodium chloride 0.9 % 100 mL IVPB  Status:  Discontinued        2 g 200 mL/hr over 30 Minutes Intravenous Every 24 hours 09/15/2020 1208 09/14/2020 1320   10/04/2020 1300  metroNIDAZOLE (FLAGYL) IVPB 500 mg  Status:  Discontinued        500 mg 100 mL/hr over 60 Minutes Intravenous Every 8 hours 09/16/2020 1208 09/16/2020  1320   09/10/2020 0915  metroNIDAZOLE (FLAGYL) IVPB 500 mg  Status:  Discontinued        500 mg 100 mL/hr over 60 Minutes Intravenous To Surgery 09/24/2020 0903 09/17/2020 0951   09/15/2020 0630  ceFAZolin (ANCEF) IVPB 2g/100 mL premix       "And" Linked Group Details   2 g 200 mL/hr over 30 Minutes Intravenous On call to O.R. 09/15/2020 5465 09/30/2020 1215   10/02/2020 0630  metroNIDAZOLE (FLAGYL) IVPB 500 mg       "And" Linked Group Details   500 mg 100 mL/hr over 60 Minutes Intravenous On call to O.R. 09/17/2020 0354 09/18/2020 0820      Assessment/Plan: 50 yo male with a neuroendocrine tumor of the head of the pancreas, POD12s/p right hemicolectomy, end ileostomy, Whipple and patch angioplasty repair of SMV. Complicated by postoperative hemorrhagic shock with DIC with extensive small bowel necrosis. All of small bowel has been resected, with external drainage of the bile duct, pancreas and stomach. - TPN, DC'd steriods - Continue antibiotics. Patient has ongoing sepsis. Pt off pressors. - Wound care: saline wet-to-dry dressings BID to midline incision and prior ostomy site -Family not at bedside this morning.  Prognosis remains very guarded.  Will likely need to consider dialysis if uremia continues to rise. Will also need to discuss trach in the next few days as he is not currently close to liberating from the ventilator. - VTE: SQH - Dispo: ICU   LOS: 13 days    Ralene Ok 10/05/2020

## 2020-10-05 NOTE — Progress Notes (Signed)
Patient's brother and sister in law (from [place]) visited this AM.

## 2020-10-06 LAB — COMPREHENSIVE METABOLIC PANEL
ALT: 182 U/L — ABNORMAL HIGH (ref 0–44)
AST: 142 U/L — ABNORMAL HIGH (ref 15–41)
Albumin: 1.4 g/dL — ABNORMAL LOW (ref 3.5–5.0)
Alkaline Phosphatase: 75 U/L (ref 38–126)
Anion gap: 8 (ref 5–15)
BUN: 123 mg/dL — ABNORMAL HIGH (ref 6–20)
CO2: 23 mmol/L (ref 22–32)
Calcium: 8.7 mg/dL — ABNORMAL LOW (ref 8.9–10.3)
Chloride: 123 mmol/L — ABNORMAL HIGH (ref 98–111)
Creatinine, Ser: 3.03 mg/dL — ABNORMAL HIGH (ref 0.61–1.24)
GFR, Estimated: 24 mL/min — ABNORMAL LOW (ref >60)
Glucose, Bld: 218 mg/dL — ABNORMAL HIGH (ref 70–99)
Potassium: 2.8 mmol/L — ABNORMAL LOW (ref 3.5–5.1)
Sodium: 154 mmol/L — ABNORMAL HIGH (ref 135–145)
Total Bilirubin: 4.3 mg/dL — ABNORMAL HIGH (ref 0.3–1.2)
Total Protein: 5.3 g/dL — ABNORMAL LOW (ref 6.5–8.1)

## 2020-10-06 LAB — DIFFERENTIAL
Abs Immature Granulocytes: 0.38 10*3/uL — ABNORMAL HIGH (ref 0.00–0.07)
Basophils Absolute: 0 10*3/uL (ref 0.0–0.1)
Basophils Relative: 0 %
Eosinophils Absolute: 0 10*3/uL (ref 0.0–0.5)
Eosinophils Relative: 0 %
Immature Granulocytes: 3 %
Lymphocytes Relative: 3 %
Lymphs Abs: 0.4 10*3/uL — ABNORMAL LOW (ref 0.7–4.0)
Monocytes Absolute: 0.4 10*3/uL (ref 0.1–1.0)
Monocytes Relative: 3 %
Neutro Abs: 11 10*3/uL — ABNORMAL HIGH (ref 1.7–7.7)
Neutrophils Relative %: 91 %

## 2020-10-06 LAB — CBC
HCT: 26.4 % — ABNORMAL LOW (ref 39.0–52.0)
Hemoglobin: 8.3 g/dL — ABNORMAL LOW (ref 13.0–17.0)
MCH: 29.1 pg (ref 26.0–34.0)
MCHC: 31.4 g/dL (ref 30.0–36.0)
MCV: 92.6 fL (ref 80.0–100.0)
Platelets: 228 10*3/uL (ref 150–400)
RBC: 2.85 MIL/uL — ABNORMAL LOW (ref 4.22–5.81)
RDW: 20.5 % — ABNORMAL HIGH (ref 11.5–15.5)
WBC: 12.2 10*3/uL — ABNORMAL HIGH (ref 4.0–10.5)
nRBC: 0.6 % — ABNORMAL HIGH (ref 0.0–0.2)

## 2020-10-06 LAB — CULTURE, BLOOD (ROUTINE X 2)
Culture: NO GROWTH
Culture: NO GROWTH

## 2020-10-06 LAB — GLUCOSE, CAPILLARY
Glucose-Capillary: 165 mg/dL — ABNORMAL HIGH (ref 70–99)
Glucose-Capillary: 179 mg/dL — ABNORMAL HIGH (ref 70–99)
Glucose-Capillary: 184 mg/dL — ABNORMAL HIGH (ref 70–99)
Glucose-Capillary: 196 mg/dL — ABNORMAL HIGH (ref 70–99)
Glucose-Capillary: 199 mg/dL — ABNORMAL HIGH (ref 70–99)
Glucose-Capillary: 205 mg/dL — ABNORMAL HIGH (ref 70–99)

## 2020-10-06 LAB — TRIGLYCERIDES: Triglycerides: 613 mg/dL — ABNORMAL HIGH (ref <150)

## 2020-10-06 LAB — MAGNESIUM: Magnesium: 2.4 mg/dL (ref 1.7–2.4)

## 2020-10-06 LAB — PREALBUMIN: Prealbumin: 25.1 mg/dL (ref 18–38)

## 2020-10-06 LAB — PHOSPHORUS: Phosphorus: 1.3 mg/dL — ABNORMAL LOW (ref 2.5–4.6)

## 2020-10-06 MED ORDER — MIDAZOLAM HCL 2 MG/2ML IJ SOLN: 1.0000 mg | INTRAMUSCULAR | Status: DC | PRN

## 2020-10-06 MED ORDER — POTASSIUM CHLORIDE 10 MEQ/50ML IV SOLN: 10.0000 meq | INTRAVENOUS | Status: DC

## 2020-10-06 MED ORDER — POTASSIUM PHOSPHATES 15 MMOLE/5ML IV SOLN
30.0000 mmol | Freq: Once | INTRAVENOUS | Status: AC
  Administered 2020-10-06: 30 mmol via INTRAVENOUS
  Filled 2020-10-06: qty 10

## 2020-10-06 MED ORDER — POTASSIUM PHOSPHATES 15 MMOLE/5ML IV SOLN
20.0000 mmol | Freq: Once | INTRAVENOUS | Status: DC
  Filled 2020-10-06: qty 6.67

## 2020-10-06 MED ORDER — ZINC CHLORIDE 1 MG/ML IV SOLN
INTRAVENOUS | Status: AC
  Filled 2020-10-06: qty 1238.4

## 2020-10-06 MED ORDER — POTASSIUM PHOSPHATES 15 MMOLE/5ML IV SOLN: 45.0000 mmol | Freq: Once | INTRAVENOUS | Status: DC

## 2020-10-06 MED ORDER — POTASSIUM CHLORIDE 10 MEQ/50ML IV SOLN
10.0000 meq | INTRAVENOUS | Status: AC
  Administered 2020-10-06 (×6): 10 meq via INTRAVENOUS
  Filled 2020-10-06 (×7): qty 50

## 2020-10-06 NOTE — Progress Notes (Signed)
eLink Physician-Brief Progress Note Patient Name: [name] DOB: [birth year] MRN: [id number]   Date of Service  10/06/2020  HPI/Events of Note  Notified of K 2.8, Phos 1.3 Creatinine 3.03  Noted Na 154 Significant output from drain 645 cc tonight Ongoing TPN  eICU Interventions  Ordered K total 60 meqs IV Kphos 20 mmol May need to start IVF or adjust TPN for hypernatremia     Intervention Category Major Interventions: Electrolyte abnormality - evaluation and management  Judd Lien 10/06/2020, 6:39 AM

## 2020-10-06 NOTE — Progress Notes (Signed)
PHARMACY - TOTAL PARENTERAL NUTRITION CONSULT NOTE  Indication:  Expected prolonged ileus  Patient Measurements: Height: 6\' 2"  (188 cm) Weight: 124.9 kg (275 lb 5.7 oz) IBW/kg (Calculated) : 82.2 TPN AdjBW (KG): 95.3 Body mass index is 35.35 kg/m.  Assessment:  25 YOM with history of mass adjacent to head of the pancreas in 2021.  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  Developed cholecystitis in Feb 2022 and had percutaneous cholecystectomy tube placement.  Presented this admission for neuroendocrine tumor resection with Whipple procedure, also had right total colectomy, colostomy and cholecystectomy on 09/24/2020. SMV was lacerated during procedure and repaired by bovine pericardial patch. Developed hemorrhagic shock with bile leak post-op and underwent placement of biliary T tube with abdominal washout on 3/15 PM. Abdomen remains open. Pharmacy consulted to manage TPN.  Glucose / Insulin: no hx DM, A1c 5.2%. CBGs previously controlled <180, now elevated 190s-220s (HC d/c'd 3/26). Utilized 42 units SSI used in last 24h + 40 units in the TPN bag Electrolytes: Na up to 154 despite removal of Na from TPN (free water deficit of ~ 3.5L), K down to 2.8, CL high 123 / CO2 WNL, corrected Ca high ~10.4, Phos down to 1.3; Mg wnl Renal: AKI, SCr improved to 3.03, (BL ~1-1.2), BUN stable at 123 Hepatic: LFTs moderately elevated - trend down, Tbili down to 4.3, TG peaked at 740, now back down to 613 (lipids removed from TPN again 3/25). Albumin 1.4, BL Prealbumin significantly improved to 25  Intake / Output; MIVF: UOP 1.1 ml/kg/hr, drain output 1131ml/24hrs; -1.2L yest, net +4.8L this admit (concentrate TPN per CCM) GI Imaging: none since TPN start GI Surgeries / Procedures: 3/14 Whipple procedure with en-bloc right hemicolectomy and end ileostomy, cholecystectomy 3/15 OR for washout, VAC placement 3/17 OR for washout, VAC replacement.  Unlikely to close abdomen d/t edema -  necrosis of the entire small bowel except for the PB limb and proximal 40-50cm of jejunum 3/19 OR for re-opening laparotomy, abdominal closure, found to have necrotic SB and deemed not survivable condition 3/21 OR for SB resection, take down of choledochojejunal anastomosis and pancreaticojejunal anastomosis, take down of duodenojejunal anastomosis, placement of externalized biliary drain / Stamm gastrostomy tube, intraabdominal washout and placement of intraperitoneal drains  Central access: R IJ CVC placed 3/15; changed to PICC placed 10/03/20 TPN start date: 09/26/2020  Nutritional Goals (RD recommendation on 3/24): 2800-3000 kCal, 185-210g AA, fluid >2L/day  Goal TPN is 90 ml/hr - will provide 185g AA, 583g CHO, for total 2726 kCal per day  Current Nutrition:  TPN; NPO  Plan:  -Continue concentrated TPN at goal rate of 90 ml/hr at 1800. Patient has been unable to tolerate lipids in TPN so far - removed again on 3/25 with TG up to >700. Watch trend closely. -Electrolytes in TPN: increase K to 40 mEq/L, remove Na/Ca/Mag, Increase Phos to 15 mmol/L, max acetate -K runs x 6 & KPhos 30 mmol already ordered per MD -Add MVI. Remove standard trace elements and add back zinc 5mg , chromium 3mcg, selenium 82mcg (with previous Tbili elevated >6 and scleral icterus noted). Consider removing chromium/selenium with renal insufficiency if doesn't improve. -Continue resistant SSI Q4H + 40 units regular insulin to TPN bag (reduced 3/26 in anticipation of CBGs dropping again with stress steroids d/c'd but have remained high-stable). Watch CBGs closely for further adjustments - note, increase of CHO in TPN starting 3/25 due to inability to tolerate lipids. -F/U TPN labs, Surgery plans,  GOC   Albertina Parr, PharmD., BCPS, BCCCP Clinical Pharmacist Please refer to [facility] for unit-specific pharmacist

## 2020-10-06 NOTE — Progress Notes (Signed)
General Surgery Follow Up Note  Subjective:    Overnight Issues:   Objective:  Vital signs for last 24 hours: Temp:  [96.44 F (35.8 C)-100.5 F (38.1 C)] 98.4 F (36.9 C) (03/28 0400) Pulse Rate:  [57-102] 64 (03/28 0800) Resp:  [15-28] 18 (03/28 0800) BP: (91-154)/(48-75) 106/62 (03/28 0800) SpO2:  [95 %-100 %] 100 % (03/28 0800) Arterial Line BP: (92-168)/(41-70) 122/55 (03/28 0800) FiO2 (%):  [40 %] 40 % (03/28 0314)  Hemodynamic parameters for last 24 hours:    Intake/Output from previous day: 03/27 0701 - 03/28 0700 In: 3193.4 [I.V.:2813.1; IV Piggyback:380.3] Out: 4445 [Urine:3300; Drains:1145]  Intake/Output this shift: Total I/O In: 147.2 [I.V.:129.6; Other:5; IV Piggyback:12.5] Out: 1 [Urine:375; Drains:115]  Vent settings for last 24 hours: Vent Mode: PRVC FiO2 (%):  [40 %] 40 % Set Rate:  [15 bmp] 15 bmp Vt Set:  [580 mL] 580 mL PEEP:  [5 cmH20-6 cmH20] 5 cmH20 Pressure Support:  [5 cmH20-6 cmH20] 5 cmH20 Plateau Pressure:  [21 cmH20-22 cmH20] 22 cmH20  Physical Exam:  Gen: comfortable, no distress Neuro: regards me this , moves spont, does not follow commands HEENT: intubated Neck: supple CV: RRR Pulm: unlabored breathing, mechanically ventilated Abd: soft, nontender GU: clear, yellow urine Extr: wwp, no edema   Results for orders placed or performed during the hospital encounter of 09/28/2020 (from the past 24 hour(s))  Potassium     Status: None   Collection Time: 10/05/20  9:48 AM  Result Value Ref Range   Potassium 3.9 3.5 - 5.1 mmol/L  Glucose, capillary     Status: Abnormal   Collection Time: 10/05/20 11:51 AM  Result Value Ref Range   Glucose-Capillary 227 (H) 70 - 99 mg/dL  Glucose, capillary     Status: Abnormal   Collection Time: 10/05/20  3:58 PM  Result Value Ref Range   Glucose-Capillary 210 (H) 70 - 99 mg/dL  Glucose, capillary     Status: Abnormal   Collection Time: 10/05/20  7:52 PM  Result Value Ref Range    Glucose-Capillary 208 (H) 70 - 99 mg/dL  Glucose, capillary     Status: Abnormal   Collection Time: 10/05/20 11:42 PM  Result Value Ref Range   Glucose-Capillary 223 (H) 70 - 99 mg/dL  Glucose, capillary     Status: Abnormal   Collection Time: 10/06/20  3:42 AM  Result Value Ref Range   Glucose-Capillary 205 (H) 70 - 99 mg/dL  Comprehensive metabolic panel     Status: Abnormal   Collection Time: 10/06/20  4:56 AM  Result Value Ref Range   Sodium 154 (H) 135 - 145 mmol/L   Potassium 2.8 (L) 3.5 - 5.1 mmol/L   Chloride 123 (H) 98 - 111 mmol/L   CO2 23 22 - 32 mmol/L   Glucose, Bld 218 (H) 70 - 99 mg/dL   BUN 123 (H) 6 - 20 mg/dL   Creatinine, Ser 3.03 (H) 0.61 - 1.24 mg/dL   Calcium 8.7 (L) 8.9 - 10.3 mg/dL   Total Protein 5.3 (L) 6.5 - 8.1 g/dL   Albumin 1.4 (L) 3.5 - 5.0 g/dL   AST 142 (H) 15 - 41 U/L   ALT 182 (H) 0 - 44 U/L   Alkaline Phosphatase 75 38 - 126 U/L   Total Bilirubin 4.3 (H) 0.3 - 1.2 mg/dL   GFR, Estimated 24 (L) >60 mL/min   Anion gap 8 5 - 15  Magnesium     Status: None  Collection Time: 10/06/20  4:56 AM  Result Value Ref Range   Magnesium 2.4 1.7 - 2.4 mg/dL  Phosphorus     Status: Abnormal   Collection Time: 10/06/20  4:56 AM  Result Value Ref Range   Phosphorus 1.3 (L) 2.5 - 4.6 mg/dL  CBC     Status: Abnormal   Collection Time: 10/06/20  4:56 AM  Result Value Ref Range   WBC 12.2 (H) 4.0 - 10.5 K/uL   RBC 2.85 (L) 4.22 - 5.81 MIL/uL   Hemoglobin 8.3 (L) 13.0 - 17.0 g/dL   HCT 26.4 (L) 39.0 - 52.0 %   MCV 92.6 80.0 - 100.0 fL   MCH 29.1 26.0 - 34.0 pg   MCHC 31.4 30.0 - 36.0 g/dL   RDW 20.5 (H) 11.5 - 15.5 %   Platelets 228 150 - 400 K/uL   nRBC 0.6 (H) 0.0 - 0.2 %  Differential     Status: Abnormal   Collection Time: 10/06/20  4:56 AM  Result Value Ref Range   Neutrophils Relative % 91 %   Neutro Abs 11.0 (H) 1.7 - 7.7 K/uL   Lymphocytes Relative 3 %   Lymphs Abs 0.4 (L) 0.7 - 4.0 K/uL   Monocytes Relative 3 %   Monocytes Absolute 0.4  0.1 - 1.0 K/uL   Eosinophils Relative 0 %   Eosinophils Absolute 0.0 0.0 - 0.5 K/uL   Basophils Relative 0 %   Basophils Absolute 0.0 0.0 - 0.1 K/uL   Immature Granulocytes 3 %   Abs Immature Granulocytes 0.38 (H) 0.00 - 0.07 K/uL  Triglycerides     Status: Abnormal   Collection Time: 10/06/20  4:56 AM  Result Value Ref Range   Triglycerides 613 (H) <150 mg/dL  Prealbumin     Status: None   Collection Time: 10/06/20  4:56 AM  Result Value Ref Range   Prealbumin 25.1 18 - 38 mg/dL  Glucose, capillary     Status: Abnormal   Collection Time: 10/06/20  7:52 AM  Result Value Ref Range   Glucose-Capillary 199 (H) 70 - 99 mg/dL    Assessment & Plan: The plan of care was discussed with the bedside nurse for the day, TK, who is in agreement with this plan and no additional concerns were raised.   Present on Admission: . Neuroendocrine neoplasm of gastrointestinal tract . Calculus of gallbladder with acute cholecystitis . Pancreas cancer ([name]) . Neuroendocrine tumor    LOS: 14 days   Additional comments:I reviewed the patient's new clinical lab test results.   and I reviewed the patients new imaging test results.    45M with a neuroendocrine tumor of the head of the pancreas  Pancreatic NET - POD14s/p right hemicolectomy, end ileostomy, Whipple and patch angioplasty repair of SMV. Complicated by postoperative hemorrhagic shock with DIC with extensive small bowel necrosis. All of small bowel has been resected, with external drainage of the bile duct, pancreas and stomach. - 1.1L out of drains combined, continue to monitor - pull OGT VDRF - PSV this AM, unlikely to liberate from the ventilator due to ICU acquired weakness. Shock - epi off this morning FEN - TPN, remove OGT as above, replete hypokalemia and hypophosphatemia DVT - SCDs, SQH ID - zosyn day 8/10, eraxis day 6/7 Goals of care - family desires maximal medical therapy in the hopes of a small bowel transplant. Palliative  care already engaged. Began discussion regarding trach last week, which would be a significant decision point in  his care. Given patient's improved neurologic state, would like to see if sedation can be lifted further to allow him to participate in discussions of whether to proceed with trach. Dispo - ICU   Critical Care Total Time: 50 minutes  [name], MD Trauma & General Surgery Please use [email or web address] to contact on call provider  10/06/2020  *Care during the described time interval was provided by me. I have reviewed this patient's available data, including medical history, events of note, physical examination and test results as part of my evaluation.

## 2020-10-06 NOTE — Progress Notes (Signed)
After bath and before moist to dry dressing, patient began grimacing, crying and wincing. He has been moving all extremities to pain and guarding. Fentanyl gtt restarted. This RN will continue to monitor and titrate fentanyl back down

## 2020-10-07 LAB — CBC
HCT: 25.2 % — ABNORMAL LOW (ref 39.0–52.0)
Hemoglobin: 8 g/dL — ABNORMAL LOW (ref 13.0–17.0)
MCH: 29.3 pg (ref 26.0–34.0)
MCHC: 31.7 g/dL (ref 30.0–36.0)
MCV: 92.3 fL (ref 80.0–100.0)
Platelets: 198 10*3/uL (ref 150–400)
RBC: 2.73 MIL/uL — ABNORMAL LOW (ref 4.22–5.81)
RDW: 21.2 % — ABNORMAL HIGH (ref 11.5–15.5)
WBC: 11.1 10*3/uL — ABNORMAL HIGH (ref 4.0–10.5)
nRBC: 0.3 % — ABNORMAL HIGH (ref 0.0–0.2)

## 2020-10-07 LAB — GLUCOSE, CAPILLARY
Glucose-Capillary: 146 mg/dL — ABNORMAL HIGH (ref 70–99)
Glucose-Capillary: 156 mg/dL — ABNORMAL HIGH (ref 70–99)
Glucose-Capillary: 179 mg/dL — ABNORMAL HIGH (ref 70–99)
Glucose-Capillary: 191 mg/dL — ABNORMAL HIGH (ref 70–99)
Glucose-Capillary: 199 mg/dL — ABNORMAL HIGH (ref 70–99)
Glucose-Capillary: 206 mg/dL — ABNORMAL HIGH (ref 70–99)

## 2020-10-07 LAB — COMPREHENSIVE METABOLIC PANEL
ALT: 146 U/L — ABNORMAL HIGH (ref 0–44)
AST: 100 U/L — ABNORMAL HIGH (ref 15–41)
Albumin: 1.3 g/dL — ABNORMAL LOW (ref 3.5–5.0)
Alkaline Phosphatase: 65 U/L (ref 38–126)
Anion gap: 7 (ref 5–15)
BUN: 105 mg/dL — ABNORMAL HIGH (ref 6–20)
CO2: 23 mmol/L (ref 22–32)
Calcium: 8.4 mg/dL — ABNORMAL LOW (ref 8.9–10.3)
Chloride: 127 mmol/L — ABNORMAL HIGH (ref 98–111)
Creatinine, Ser: 2.54 mg/dL — ABNORMAL HIGH (ref 0.61–1.24)
GFR, Estimated: 30 mL/min — ABNORMAL LOW (ref >60)
Glucose, Bld: 215 mg/dL — ABNORMAL HIGH (ref 70–99)
Potassium: 3.5 mmol/L (ref 3.5–5.1)
Sodium: 157 mmol/L — ABNORMAL HIGH (ref 135–145)
Total Bilirubin: 4 mg/dL — ABNORMAL HIGH (ref 0.3–1.2)
Total Protein: 5.1 g/dL — ABNORMAL LOW (ref 6.5–8.1)

## 2020-10-07 MED ORDER — ZINC CHLORIDE 1 MG/ML IV SOLN
INTRAVENOUS | Status: AC
  Filled 2020-10-07: qty 1840.8

## 2020-10-07 NOTE — Progress Notes (Addendum)
Patient ID: [name], male   DOB: 1971/06/01, 50 y.o.   MRN: [id number] 8 Days Post-Op   Subjective: On vent ROS negative except as listed above. Objective: Vital signs in last 24 hours: Temp:  [98.3 F (36.8 C)-100.6 F (38.1 C)] 98.3 F (36.8 C) (03/29 0400) Pulse Rate:  [56-102] 89 (03/29 0800) Resp:  [13-27] 17 (03/29 0800) BP: (98-137)/(54-83) 131/79 (03/29 0800) SpO2:  [98 %-100 %] 100 % (03/29 0800) Arterial Line BP: (105-153)/(45-75) 146/75 (03/29 0800) FiO2 (%):  [40 %] 40 % (03/29 0400) Last BM Date:  (PTA)  Intake/Output from previous day: 03/28 0701 - 03/29 0700 In: 3808.3 [I.V.:2707.1; IV Piggyback:1096.2] Out: 0932 [Urine:4405; Drains:1740] Intake/Output this shift: Total I/O In: 121.9 [I.V.:109.4; IV Piggyback:12.5] Out: -   General appearance: on vent Resp: clear to auscultation bilaterally Cardio: regular rate and rhythm GI: drains functional, midline wound OK Extremities: some edema Neurologic: Mental status: aroused and F/C  Lab Results: CBC  Recent Labs    10/05/20 0407 10/06/20 0456  WBC 15.1* 12.2*  HGB 8.6* 8.3*  HCT 26.8* 26.4*  PLT 253 228   BMET Recent Labs    10/06/20 0456 10/07/20 0545  NA 154* 157*  K 2.8* 3.5  CL 123* 127*  CO2 23 23  GLUCOSE 218* 215*  BUN 123* 105*  CREATININE 3.03* 2.54*  CALCIUM 8.7* 8.4*   PT/INR No results for input(s): LABPROT, INR in the last 72 hours. ABG No results for input(s): PHART, HCO3 in the last 72 hours.  Invalid input(s): PCO2, PO2  Studies/Results: No results found.  Anti-infectives: Anti-infectives (From admission, onward)   Start     Dose/Rate Route Frequency Ordered Stop   10/01/20 1100  anidulafungin (ERAXIS) 100 mg in sodium chloride 0.9 % 100 mL IVPB       "Followed by" Linked Group Details   100 mg 78 mL/hr over 100 Minutes Intravenous Every 24 hours 09/30/20 1014     09/30/20 1100  anidulafungin (ERAXIS) 200 mg in sodium chloride 0.9 % 200 mL IVPB        "Followed by" Linked Group Details   200 mg 78 mL/hr over 200 Minutes Intravenous  Once 09/30/20 1014 09/30/20 1516   09/26/2020 1600  vancomycin (VANCOREADY) IVPB 1000 mg/200 mL  Status:  Discontinued        1,000 mg 200 mL/hr over 60 Minutes Intravenous Every 24 hours 10/08/2020 1137 09/30/2020 1510   10/01/2020 1400  piperacillin-tazobactam (ZOSYN) IVPB 3.375 g        3.375 g 12.5 mL/hr over 240 Minutes Intravenous Every 8 hours 09/30/2020 1322     09/15/2020 1430  vancomycin (VANCOREADY) IVPB 1750 mg/350 mL  Status:  Discontinued        1,750 mg 175 mL/hr over 120 Minutes Intravenous Every 24 hours 09/24/2020 1349 09/18/2020 1137   09/26/20 1130  vancomycin (VANCOREADY) IVPB 2000 mg/400 mL        2,000 mg 200 mL/hr over 120 Minutes Intravenous  Once 09/26/20 1031 09/26/20 1411   09/26/20 1030  vancomycin variable dose per unstable renal function (pharmacist dosing)  Status:  Discontinued         Does not apply See admin instructions 09/26/20 1031 09/28/20 0816   09/24/2020 2200  metroNIDAZOLE (FLAGYL) IVPB 500 mg  Status:  Discontinued        500 mg 100 mL/hr over 60 Minutes Intravenous Every 8 hours 09/19/2020 1820 10/01/2020 1322   09/28/2020 2000  cefTRIAXone (ROCEPHIN) 2 g in  sodium chloride 0.9 % 100 mL IVPB  Status:  Discontinued        2 g 200 mL/hr over 30 Minutes Intravenous Every 24 hours 10/09/2020 1820 09/17/2020 1322   10/04/2020 1330  vancomycin (VANCOCIN) 2,250 mg in sodium chloride 0.9 % 500 mL IVPB  Status:  Discontinued        2,250 mg 250 mL/hr over 120 Minutes Intravenous Every 48 hours 09/26/2020 1228 09/14/2020 1320   09/16/2020 1300  cefTRIAXone (ROCEPHIN) 2 g in sodium chloride 0.9 % 100 mL IVPB  Status:  Discontinued        2 g 200 mL/hr over 30 Minutes Intravenous Every 24 hours 09/09/2020 1208 09/09/2020 1320   09/12/2020 1300  metroNIDAZOLE (FLAGYL) IVPB 500 mg  Status:  Discontinued        500 mg 100 mL/hr over 60 Minutes Intravenous Every 8 hours 10/09/2020 1208 10/08/2020 1320   09/12/2020 0915   metroNIDAZOLE (FLAGYL) IVPB 500 mg  Status:  Discontinued        500 mg 100 mL/hr over 60 Minutes Intravenous To Surgery 09/16/2020 0903 09/19/2020 0951   10/01/2020 0630  ceFAZolin (ANCEF) IVPB 2g/100 mL premix       "And" Linked Group Details   2 g 200 mL/hr over 30 Minutes Intravenous On call to O.R. 10/05/2020 0347 09/17/2020 1215   10/07/2020 0630  metroNIDAZOLE (FLAGYL) IVPB 500 mg       "And" Linked Group Details   500 mg 100 mL/hr over 60 Minutes Intravenous On call to O.R. 10/03/2020 4259 09/26/2020 0820      Assessment/Plan: 47M with a neuroendocrine tumor of the head of the pancreas  Pancreatic NET - POD14s/p right hemicolectomy, end ileostomy, Whipple and patch angioplasty repair of SMV. Complicated by postoperative hemorrhagic shock with DIC with extensive small bowel necrosis. All of small bowel has been resected, with external drainage of the bile duct, pancreas and stomach. - 1.7L out of drains combined, continue to monitor - pull OGT VDRF - PSV this AM, F/C well, may allow trial of extubation soon. Then he would be able to discuss re-intubation if needed Shock - epi off FEN - TPN, remove OGT as above, replete lytes P lab results Protein calorie malnutrition DVT - SCDs, SQH ID - zosyn day 9/10, eraxis day 7/7 Goals of care - family desires maximal medical therapy in the hopes of a small bowel transplant. Palliative care already engaged. Began discussion regarding possible trach last week. Dispo - ICU  Labs are being drawn now Critical care 34 min  LOS: 15 days    Georganna Skeans, MD, MPH, FACS Trauma & General Surgery Use [email or web address] to contact on call provider  10/07/2020

## 2020-10-07 NOTE — Progress Notes (Signed)
PHARMACY - TOTAL PARENTERAL NUTRITION CONSULT NOTE  Indication:  Expected prolonged ileus  Patient Measurements: Height: 6\' 2"  (188 cm) Weight: 124.9 kg (275 lb 5.7 oz) IBW/kg (Calculated) : 82.2 TPN AdjBW (KG): 95.3 Body mass index is 35.35 kg/m.  Assessment:  61 YOM with history of mass adjacent to head of the pancreas in 2021.  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  Developed cholecystitis in Feb 2022 and had percutaneous cholecystectomy tube placement.  Presented this admission for neuroendocrine tumor resection with Whipple procedure, also had right total colectomy, colostomy and cholecystectomy on 09/28/2020. SMV was lacerated during procedure and repaired by bovine pericardial patch. Developed hemorrhagic shock with bile leak post-op and underwent placement of biliary T tube with abdominal washout on 3/15 PM. Abdomen remains open. Pharmacy consulted to manage TPN.  Glucose / Insulin: no hx DM, A1c 5.2%. CBGs previously controlled <180, now elevated 190s-200s (HC d/c'd 3/26). Utilized 27 units SSI used in last 24h + 40 units in the TPN bag Electrolytes: Na up to 157 despite removal of Na from TPN (free water deficit of ~ 9L), K improved to 3.5, CL high 127 / CO2 WNL, corrected Ca high ~10.4, Phos down to 1.3; Mg wnl Renal: AKI, SCr improved to 2.54, (BL ~1-1.2), BUN improved to 1.5 Hepatic: LFTs moderately elevated - trend down, Tbili down to 4, TG peaked at 740, now back down to 613 (lipids removed from TPN again 3/25). Albumin 1.3, BL Prealbumin significantly improved to 25  Intake / Output; MIVF: UOP 1.5 ml/kg/hr, drain output 174ml/24hrs; -2.3L yest, net +1.5L this admit (concentrate TPN per CCM) GI Imaging: none since TPN start GI Surgeries / Procedures: 3/14 Whipple procedure with en-bloc right hemicolectomy and end ileostomy, cholecystectomy 3/15 OR for washout, VAC placement 3/17 OR for washout, VAC replacement.  Unlikely to close abdomen d/t edema  - necrosis of the entire small bowel except for the PB limb and proximal 40-50cm of jejunum 3/19 OR for re-opening laparotomy, abdominal closure, found to have necrotic SB and deemed not survivable condition 3/21 OR for SB resection, take down of choledochojejunal anastomosis and pancreaticojejunal anastomosis, take down of duodenojejunal anastomosis, placement of externalized biliary drain / Stamm gastrostomy tube, intraabdominal washout and placement of intraperitoneal drains  Central access: R IJ CVC placed 3/15; changed to PICC placed 10/03/20 TPN start date: 10/08/2020  Nutritional Goals (RD recommendation on 3/24): 2800-3000 kCal, 185-210g AA, fluid >2L/day  Goal TPN is 90 ml/hr - will provide 185g AA, 583g CHO, for total 2726 kCal per day  Current Nutrition:  TPN; NPO  Plan:  -Given significant free water deficit, will liberalize volume in TPN bag. Increase TPN rate of 130 ml/hr at 1800. TPN to provide 185g AA and 2748 kCal. Patient has been unable to tolerate lipids in TPN so far - removed again on 3/25 with TG up to >700. Watch trend closely. -Electrolytes in TPN: Decrease K to 30 mEq/L to adjust for rate increase, remove Na/Ca/Mag, Decrease Phos to 12 mmol/L to adjust for rate increase, max acetate -Add MVI. Remove standard trace elements and add back zinc 5mg , chromium 81mcg, selenium 36mcg (with previous Tbili elevated >6 and scleral icterus noted). Consider removing chromium/selenium with renal insufficiency if doesn't improve. -Continue resistant SSI Q4H + 40 units regular insulin to TPN bag (reduced 3/26 in anticipation of CBGs dropping again with stress steroids d/c'd but have remained high-stable). Watch CBGs closely for further adjustments - note, increase of CHO in  TPN starting 3/25 due to inability to tolerate lipids. -F/U TPN labs, Surgery plans, GOC   Albertina Parr, PharmD., BCPS, BCCCP Clinical Pharmacist Please refer to [facility] for unit-specific pharmacist

## 2020-10-08 LAB — COMPREHENSIVE METABOLIC PANEL
ALT: 134 U/L — ABNORMAL HIGH (ref 0–44)
AST: 92 U/L — ABNORMAL HIGH (ref 15–41)
Albumin: 1.3 g/dL — ABNORMAL LOW (ref 3.5–5.0)
Alkaline Phosphatase: 64 U/L (ref 38–126)
BUN: 102 mg/dL — ABNORMAL HIGH (ref 6–20)
CO2: 23 mmol/L (ref 22–32)
Calcium: 8.5 mg/dL — ABNORMAL LOW (ref 8.9–10.3)
Chloride: >130 mmol/L (ref 98–111)
Creatinine, Ser: 2.62 mg/dL — ABNORMAL HIGH (ref 0.61–1.24)
GFR, Estimated: 29 mL/min — ABNORMAL LOW (ref >60)
Glucose, Bld: 197 mg/dL — ABNORMAL HIGH (ref 70–99)
Potassium: 4.2 mmol/L (ref 3.5–5.1)
Sodium: 158 mmol/L — ABNORMAL HIGH (ref 135–145)
Total Bilirubin: 4.2 mg/dL — ABNORMAL HIGH (ref 0.3–1.2)
Total Protein: 5.5 g/dL — ABNORMAL LOW (ref 6.5–8.1)

## 2020-10-08 LAB — PHOSPHORUS: Phosphorus: 3.2 mg/dL (ref 2.5–4.6)

## 2020-10-08 LAB — CBC
HCT: 26.5 % — ABNORMAL LOW (ref 39.0–52.0)
Hemoglobin: 8 g/dL — ABNORMAL LOW (ref 13.0–17.0)
MCH: 28.6 pg (ref 26.0–34.0)
MCHC: 30.2 g/dL (ref 30.0–36.0)
MCV: 94.6 fL (ref 80.0–100.0)
Platelets: 197 10*3/uL (ref 150–400)
RBC: 2.8 MIL/uL — ABNORMAL LOW (ref 4.22–5.81)
RDW: 21.3 % — ABNORMAL HIGH (ref 11.5–15.5)
WBC: 10.2 10*3/uL (ref 4.0–10.5)
nRBC: 0.4 % — ABNORMAL HIGH (ref 0.0–0.2)

## 2020-10-08 LAB — GLUCOSE, CAPILLARY
Glucose-Capillary: 165 mg/dL — ABNORMAL HIGH (ref 70–99)
Glucose-Capillary: 166 mg/dL — ABNORMAL HIGH (ref 70–99)
Glucose-Capillary: 168 mg/dL — ABNORMAL HIGH (ref 70–99)
Glucose-Capillary: 180 mg/dL — ABNORMAL HIGH (ref 70–99)
Glucose-Capillary: 198 mg/dL — ABNORMAL HIGH (ref 70–99)
Glucose-Capillary: 200 mg/dL — ABNORMAL HIGH (ref 70–99)

## 2020-10-08 MED ORDER — ORAL CARE MOUTH RINSE
15.0000 mL | Freq: Two times a day (BID) | OROMUCOSAL | Status: DC
  Administered 2020-10-08 – 2020-10-14 (×13): 15 mL via OROMUCOSAL

## 2020-10-08 MED ORDER — HYDROMORPHONE HCL 1 MG/ML IJ SOLN
0.5000 mg | INTRAMUSCULAR | Status: DC | PRN
  Administered 2020-10-08 – 2020-10-14 (×18): 1 mg via INTRAVENOUS
  Filled 2020-10-08 (×19): qty 1

## 2020-10-08 MED ORDER — DEXTROSE 5 % IV SOLN: INTRAVENOUS | Status: DC

## 2020-10-08 MED ORDER — ZINC CHLORIDE 1 MG/ML IV SOLN
INTRAVENOUS | Status: AC
  Filled 2020-10-08: qty 1840.8

## 2020-10-08 MED ORDER — CHLORHEXIDINE GLUCONATE 0.12 % MT SOLN
15.0000 mL | Freq: Two times a day (BID) | OROMUCOSAL | Status: DC
  Administered 2020-10-08 – 2020-10-14 (×13): 15 mL via OROMUCOSAL
  Filled 2020-10-08 (×10): qty 15

## 2020-10-08 NOTE — Progress Notes (Signed)
Nutrition Follow-up  DOCUMENTATION CODES:   Not applicable  INTERVENTION:   TNA to meet nutrition needs  Pt at high risk of malnutrition due to catabolic state.  Multiple drains in place with > 1 L output  NUTRITION DIAGNOSIS:   Increased nutrient needs related to post-op healing as evidenced by estimated needs. Ongoing.   GOAL:   Patient will meet greater than or equal to 90% of their needs Met on TNA.   MONITOR:   Vent status,Labs  REASON FOR ASSESSMENT:   Ventilator    ASSESSMENT:   Pt with a PMH of HTN, HLD, HF with reduced EF, AICD and dx mass adjacent to head of the pancreas in 2021.  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  Developed cholecystitis in Feb 2022 and had percutaneous cholecystectomy tube placement.  Presented this admission for neuroendocrine tumor resection with Whipple procedure, also had right total colectomy, colostomy and cholecystectomy on 10/06/2020.  SMV was lacerated during procedure and repaired by bovine pericardial patch.  Developed hemorrhagic shock with bile leak post-op and underwent placement of biliary T tube with abdominal washout on 3/15.    Pt discussed during ICU rounds and with RN. Per CCM [facility] evaluating for SB transplant. Sedation weaning, extubated today.  Albumin remains low indicating high inflammatory state and high risk of mortatlity. Albumin does not corralate with nutrition status.   Admission weight: 210 lb (question stated weight), current weight 275 lb. Noted severe edema present on exam.   3/14 s/p whipple; intra-operative SMV laceration s/p repair; massive transfusion protocol - 120 products; maxed on pressors  3/15 s/p washout and VAC placement  3/16 off pressors; VAC output decreasing, hematemesis with NG not functioning, OG placed 3/17 s/p re-opening of recent laparotomy, SBR, VAC placement (per OR report pt with SB necrosis, only viable areas are stomach pb limb, 40-50 cm beyond GJ  anastomosis, and colon) Pt will be TPN dependent. Concern that PB limb may become necrotic.  3/18 TPN initiated @ 45 ml/hr with plans to advance to 90 ml on 3/19 3/22 entirety of SB resected due to necrosis.  3/25 lipids removed from TPN due to elevated TG 3/30 extubated    Medications reviewed and include: SSI, protonix  Precedex   Labs reviewed: Na 158, BUN: 102, Cr: 2.62, Albumin 1.3, TG: 740 --> 613 CBG's: 166-199   Output:  UOP: 3835 ml   Drain 3: 660 ml  R drain: 265 ml  RUQ drain: 105 ml  RLQ drain: 555 ml  20 F G tube: 105 ml Total output: 1690 ml   TPN @ 130 provides: 2748 kcal and 185 grams protein   Diet Order:   Diet Order            Diet NPO time specified  Diet effective now                 EDUCATION NEEDS:   No education needs have been identified at this time  Skin:  Skin Assessment: Reviewed RN Assessment  Last BM:  none  Height:   Ht Readings from Last 1 Encounters:  10/02/2020 _0  (1.88 m)    Weight:   Wt Readings from Last 1 Encounters:  10/01/20 124.9 kg    Ideal Body Weight:     BMI:  Body mass index is 35.35 kg/m.  Estimated Nutritional Needs:   Kcal:  2800-3000  Protein:  185-210 grams  Fluid:  > 2L/day  Dexter Sauser P., RD,  LDN, CNSC See AMiON for contact information

## 2020-10-08 NOTE — Progress Notes (Incomplete)
PHARMACY - TOTAL PARENTERAL NUTRITION CONSULT NOTE  Indication:  Expected prolonged ileus  Patient Measurements: Height: 6\' 2"  (188 cm) Weight: 124.9 kg (275 lb 5.7 oz) IBW/kg (Calculated) : 82.2 TPN AdjBW (KG): 95.3 Body mass index is 35.35 kg/m.  Assessment:  54 YOM with history of mass adjacent to head of the pancreas in 2021.  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  Developed cholecystitis in Feb 2022 and had percutaneous cholecystectomy tube placement.  Presented this admission for neuroendocrine tumor resection with Whipple procedure, also had right total colectomy, colostomy and cholecystectomy on 09/10/2020. SMV was lacerated during procedure and repaired by bovine pericardial patch. Developed hemorrhagic shock with bile leak post-op and underwent placement of biliary T tube with abdominal washout on 3/15 PM. Abdomen remains open. Pharmacy consulted to manage TPN.  Glucose / Insulin: no hx DM, A1c 5.2%. CBGs previously controlled <180, now elevated 150s-190s (HC d/c'd 3/26). Utilized 23 units SSI used in last 24h + 40 units in the TPN bag Electrolytes: Na up to 158 despite removal of Na from TPN and increasing free water in TPN bag (free water deficit of ~ 9L), K improved to 4.2, CL >130 / CO2 WNL, corrected Ca high ~10.4, Phos 3.2; Mg wnl Renal: AKI - SCr relatively stable at 2.62, (BL ~1-1.2), BUN improved to 102 Hepatic: LFTs moderately elevated - trend down, tbili 4.2, TG peaked at 740, now back down to 613 (lipids removed from TPN again 3/25). Albumin 1.3, BL Prealbumin significantly improved to 25  Intake / Output; MIVF: UOP 1.3 ml/kg/hr, drain output 1657ml/24hrs; UOP 3.8L, -1.9L yest, net -443mL this admit (concentrate TPN per CCM) GI Imaging: none since TPN start GI Surgeries / Procedures:  3/14 Whipple procedure with R hemicolectomy and end ileostomy, cholecystectomy 3/15 washout, VAC placement 3/17 washout, VAC replacement.  Necrosis of entire  small bowel except for the PB limb and proximal 40-50cm of jejunum 3/19 re-opening laparotomy, abd closure.  Necrotic SB. 3/21 SB resection, take down of choledochojejunal anastomosis and pancreaticojejunal anastomosis, take down of duodenojejunal anastomosis, placement of externalized biliary drain / Stamm gastrostomy tube, IA washout and placement of intraperitoneal drains  Central access: R IJ CVC placed 3/15; changed to PICC placed 10/03/20 TPN start date: 10/08/2020  Nutritional Goals (RD rec on 3/30): 2800-3000 kCal, 185-210g AA, fluid >2L/day  Current Nutrition:  TPN  Plan:  - Continue TPN rate of 130 ml/hr  - TPN provides 185g AA and 2748 kCal. Patient has been unable to tolerate lipids in TPN so far - removed again on 3/25 with TG up to >700. Watch trend closely. - Electrolytes in TPN: Decrease K to 25 mEq/L, remove Na/Ca/Mag, Phos 12 mmol/L, max acetate - Daily multivitamin in TPN.  Remove standard trace elements and add back zinc 5mg , chromium 69mcg, selenium 23mcg  - Continue resistant SSI Q4H and 40 units regular insulin to TPN bag (reduced 3/26 in anticipation of CBGs dropping again with stress steroids d/c'd but have remained high-stable).  Note, increase of CHO in TPN starting 3/25 due to inability to tolerate lipids. -Trauma MD planning to add D5 infusion for additional free water and sodium correction -monitor CBGs  -F/U TPN labs, Surgery plans, GOC -Planning to extubate today

## 2020-10-08 NOTE — Progress Notes (Signed)
SLP Cancellation Note  Patient Details Name: [name] MRN: [id number] DOB: 07-Sep-1970   Cancelled treatment:       Reason Eval/Treat Not Completed: Other (comment). New SLP swallow eval orders after extubation this am. Pt with prolonged intubation, on TPN given complex abdominal surgery and removal of small bowel. Will follow for readiness for swallow eval in the following days after discussion with CCS team.    Marvette Schamp, Katherene Ponto 10/08/2020, 10:15 AM

## 2020-10-08 NOTE — Progress Notes (Signed)
Trauma/Critical Care Follow Up Note  Subjective:    Overnight Issues:   Objective:  Vital signs for last 24 hours: Temp:  [97.8 F (36.6 C)-99.2 F (37.3 C)] 98.8 F (37.1 C) (03/30 0800) Pulse Rate:  [58-129] 91 (03/30 1000) Resp:  [11-21] 20 (03/30 1000) BP: (99-150)/(45-79) 118/79 (03/30 1000) SpO2:  [92 %-100 %] 95 % (03/30 1000) Arterial Line BP: (93-149)/(45-73) 114/58 (03/30 0800) FiO2 (%):  [32 %-40 %] 32 % (03/30 0926)  Hemodynamic parameters for last 24 hours:    Intake/Output from previous day: 03/29 0701 - 03/30 0700 In: 3541.5 [I.V.:3258.9; IV Piggyback:282.6] Out: 5277 [Urine:3835; Drains:1690]  Intake/Output this shift: Total I/O In: 476.1 [I.V.:442.1; IV Piggyback:33.9] Out: 635 [Urine:600; Drains:35]  Vent settings for last 24 hours: Vent Mode: CPAP;PSV FiO2 (%):  [32 %-40 %] 32 % Set Rate:  [15 bmp] 15 bmp Vt Set:  [580 mL] 580 mL PEEP:  [5 cmH20] 5 cmH20 Pressure Support:  [5 cmH20] 5 cmH20 Plateau Pressure:  [15 cmH20-16 cmH20] 16 cmH20  Physical Exam:  Gen: comfortable, no distress Neuro: non-focal exam, f/c HEENT: PERRL Neck: supple CV: RRR Pulm: unlabored breathing Abd: soft, NT, multiple drains-stable GU: clear yellow urine, foley Extr: wwp, no edema   Results for orders placed or performed during the hospital encounter of 09/10/2020 (from the past 24 hour(s))  Glucose, capillary     Status: Abnormal   Collection Time: 10/07/20 11:54 AM  Result Value Ref Range   Glucose-Capillary 156 (H) 70 - 99 mg/dL  Glucose, capillary     Status: Abnormal   Collection Time: 10/07/20  3:33 PM  Result Value Ref Range   Glucose-Capillary 146 (H) 70 - 99 mg/dL  Glucose, capillary     Status: Abnormal   Collection Time: 10/07/20  8:02 PM  Result Value Ref Range   Glucose-Capillary 179 (H) 70 - 99 mg/dL  Glucose, capillary     Status: Abnormal   Collection Time: 10/07/20 11:48 PM  Result Value Ref Range   Glucose-Capillary 199 (H) 70 - 99  mg/dL  Glucose, capillary     Status: Abnormal   Collection Time: 10/08/20  4:09 AM  Result Value Ref Range   Glucose-Capillary 166 (H) 70 - 99 mg/dL  Comprehensive metabolic panel     Status: Abnormal   Collection Time: 10/08/20  5:44 AM  Result Value Ref Range   Sodium 158 (H) 135 - 145 mmol/L   Potassium 4.2 3.5 - 5.1 mmol/L   Chloride >130 (HH) 98 - 111 mmol/L   CO2 23 22 - 32 mmol/L   Glucose, Bld 197 (H) 70 - 99 mg/dL   BUN 102 (H) 6 - 20 mg/dL   Creatinine, Ser 2.62 (H) 0.61 - 1.24 mg/dL   Calcium 8.5 (L) 8.9 - 10.3 mg/dL   Total Protein 5.5 (L) 6.5 - 8.1 g/dL   Albumin 1.3 (L) 3.5 - 5.0 g/dL   AST 92 (H) 15 - 41 U/L   ALT 134 (H) 0 - 44 U/L   Alkaline Phosphatase 64 38 - 126 U/L   Total Bilirubin 4.2 (H) 0.3 - 1.2 mg/dL   GFR, Estimated 29 (L) >60 mL/min   Anion gap NOT CALCULATED 5 - 15  CBC     Status: Abnormal   Collection Time: 10/08/20  5:44 AM  Result Value Ref Range   WBC 10.2 4.0 - 10.5 K/uL   RBC 2.80 (L) 4.22 - 5.81 MIL/uL   Hemoglobin 8.0 (L) 13.0 - 17.0  g/dL   HCT 26.5 (L) 39.0 - 52.0 %   MCV 94.6 80.0 - 100.0 fL   MCH 28.6 26.0 - 34.0 pg   MCHC 30.2 30.0 - 36.0 g/dL   RDW 21.3 (H) 11.5 - 15.5 %   Platelets 197 150 - 400 K/uL   nRBC 0.4 (H) 0.0 - 0.2 %  Phosphorus     Status: None   Collection Time: 10/08/20  5:44 AM  Result Value Ref Range   Phosphorus 3.2 2.5 - 4.6 mg/dL  Glucose, capillary     Status: Abnormal   Collection Time: 10/08/20  8:42 AM  Result Value Ref Range   Glucose-Capillary 198 (H) 70 - 99 mg/dL    Assessment & Plan: The plan of care was discussed with the bedside nurse for the day, who is in agreement with this plan and no additional concerns were raised.   Present on Admission: . Neuroendocrine neoplasm of gastrointestinal tract . Calculus of gallbladder with acute cholecystitis . Pancreas cancer ([place]) . Neuroendocrine tumor    LOS: 16 days   Additional comments:I reviewed the patient's new clinical lab test results.    and I reviewed the patients new imaging test results.    19M with a neuroendocrine tumor of the head of the pancreas  Pancreatic NET - POD15s/p right hemicolectomy, end ileostomy, Whipple and patch angioplasty repair of SMV. Complicated by postoperative hemorrhagic shock with DIC with extensive small bowel necrosis. All of small bowel has been resected, with external drainage of the bile duct, pancreas and stomach. - 1.7L out of drains combined, continue to monitor VDRF - PSV this AM, F/C well, extubate this AM Shock - resolved FEN - TPN, FW added to TPN yest and remains hypernatremic, start D5W Protein calorie malnutrition DVT - SCDs, SQH ID - s/p 10d zosyn and 7d eraxis Foley - d/c Goals of care - family desires maximal medical therapy in the hopes of a small bowel transplant. Palliative care already engaged.  Dispo - ICU   Critical Care Total Time: 45 minutes  [name], MD Trauma & General Surgery Please use [email or web address] to contact on call provider  10/08/2020  *Care during the described time interval was provided by me. I have reviewed this patient's available data, including medical history, events of note, physical examination and test results as part of my evaluation.

## 2020-10-08 NOTE — Progress Notes (Signed)
PHARMACY - TOTAL PARENTERAL NUTRITION CONSULT NOTE  Indication:  Expected prolonged ileus  Patient Measurements: Height: 6\' 2"  (188 cm) Weight: 124.9 kg (275 lb 5.7 oz) IBW/kg (Calculated) : 82.2 TPN AdjBW (KG): 95.3 Body mass index is 35.35 kg/m.  Assessment:  72 YOM with history of mass adjacent to head of the pancreas in 2021.  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  Developed cholecystitis in Feb 2022 and had percutaneous cholecystectomy tube placement.  Presented this admission for neuroendocrine tumor resection with Whipple procedure, also had right total colectomy, colostomy and cholecystectomy on 09/28/2020. SMV was lacerated during procedure and repaired by bovine pericardial patch. Developed hemorrhagic shock with bile leak post-op and underwent placement of biliary T tube with abdominal washout on 3/15 PM. Abdomen remains open. Pharmacy consulted to manage TPN.  Glucose / Insulin: no hx DM, A1c 5.2%. CBGs previously controlled <180, now elevated 150s-190s (HC d/c'd 3/26). Utilized 23 units SSI used in last 24h + 40 units in the TPN bag Electrolytes: Na up to 158 despite removal of Na from TPN and increasing free water in TPN bag (free water deficit of ~ 9L), K improved to 4.2, CL >130 / CO2 WNL, corrected Ca high ~10.4, Phos 3.2; Mg wnl Renal: AKI, SCr relatively stable at 2.62, (BL ~1-1.2), BUN improved to 102 Hepatic: LFTs moderately elevated - trend down, Tbili 4.2, TG peaked at 740, now back down to 613 (lipids removed from TPN again 3/25). Albumin 1.3, BL Prealbumin significantly improved to 25  Intake / Output; MIVF: UOP 1.3 ml/kg/hr, drain output 1667ml/24hrs; UOP 3.8L, -1.9L yest, net -418mL this admit (concentrate TPN per CCM) GI Imaging: none since TPN start GI Surgeries / Procedures: 3/14 Whipple procedure with en-bloc right hemicolectomy and end ileostomy, cholecystectomy 3/15 OR for washout, VAC placement 3/17 OR for washout, VAC  replacement.  Unlikely to close abdomen d/t edema - necrosis of the entire small bowel except for the PB limb and proximal 40-50cm of jejunum 3/19 OR for re-opening laparotomy, abdominal closure, found to have necrotic SB and deemed not survivable condition 3/21 OR for SB resection, take down of choledochojejunal anastomosis and pancreaticojejunal anastomosis, take down of duodenojejunal anastomosis, placement of externalized biliary drain / Stamm gastrostomy tube, intraabdominal washout and placement of intraperitoneal drains  Central access: R IJ CVC placed 3/15; changed to PICC placed 10/03/20 TPN start date: 09/15/2020  Nutritional Goals (RD recommendation on 3/24): 2800-3000 kCal, 185-210g AA, fluid >2L/day  Goal TPN is 90 ml/hr - will provide 185g AA, 583g CHO, for total 2726 kCal per day  Current Nutrition:  TPN; NPO  Plan:  -Continue TPN rate of 130 ml/hr at 1800. TPN to provide 185g AA and 2748 kCal. Patient has been unable to tolerate lipids in TPN so far - removed again on 3/25 with TG up to >700. Watch trend closely. -Electrolytes in TPN: Decrease K to 25 mEq/L, remove Na/Ca/Mag, Continue Phos 12 mmol/L, max acetate -Add MVI. Remove standard trace elements and add back zinc 5mg , chromium 82mcg, selenium 74mcg (with previous Tbili elevated >6 and scleral icterus noted). Consider removing chromium/selenium with renal insufficiency if doesn't improve. -Continue resistant SSI Q4H + 40 units regular insulin to TPN bag (reduced 3/26 in anticipation of CBGs dropping again with stress steroids d/c'd but have remained high-stable). Watch CBGs closely for further adjustments - note, increase of CHO in TPN starting 3/25 due to inability to tolerate lipids. -Trauma MD planning to add D5 infusion for  additional free water and sodium correction -monitor CBGs  -F/U TPN labs, Surgery plans, GOC -Planning to extubate today    Albertina Parr, PharmD., BCPS, BCCCP Clinical Pharmacist Please refer  to [facility] for unit-specific pharmacist

## 2020-10-08 NOTE — Progress Notes (Signed)
Date and time results received: 10/08/20 0655   Test: Chloride Critical Value: Greater than 130  Name of Provider Notified: Dr. Donne Hazel  Orders Received? Or Actions Taken?: No orders received.

## 2020-10-08 NOTE — Procedures (Signed)
Extubation Procedure Note  Patient Details:   Name: [name] DOB: 1971-01-05 MRN: [id number]   Airway Documentation:    Vent end date: 10/08/20 Vent end time: 0922   Evaluation  O2 sats: stable throughout Complications: No apparent complications Patient did tolerate procedure well. Bilateral Breath Sounds: Rhonchi   No, pt could not speak post extubation.  Pt extubated to 3 l/m [name] per physician order.  Earney Navy 10/08/2020, 9:22 AM

## 2020-10-09 DIAGNOSIS — K559 Vascular disorder of intestine, unspecified: Secondary | ICD-10-CM | POA: Diagnosis not present

## 2020-10-09 DIAGNOSIS — D3A8 Other benign neuroendocrine tumors: Secondary | ICD-10-CM | POA: Diagnosis not present

## 2020-10-09 DIAGNOSIS — Z7189 Other specified counseling: Secondary | ICD-10-CM | POA: Diagnosis not present

## 2020-10-09 DIAGNOSIS — J9601 Acute respiratory failure with hypoxia: Secondary | ICD-10-CM | POA: Diagnosis not present

## 2020-10-09 LAB — COMPREHENSIVE METABOLIC PANEL
ALT: 114 U/L — ABNORMAL HIGH (ref 0–44)
AST: 74 U/L — ABNORMAL HIGH (ref 15–41)
Albumin: 1.4 g/dL — ABNORMAL LOW (ref 3.5–5.0)
Alkaline Phosphatase: 66 U/L (ref 38–126)
Anion gap: 5 (ref 5–15)
BUN: 96 mg/dL — ABNORMAL HIGH (ref 6–20)
CO2: 23 mmol/L (ref 22–32)
Calcium: 8.8 mg/dL — ABNORMAL LOW (ref 8.9–10.3)
Chloride: 130 mmol/L — ABNORMAL HIGH (ref 98–111)
Creatinine, Ser: 2.58 mg/dL — ABNORMAL HIGH (ref 0.61–1.24)
GFR, Estimated: 30 mL/min — ABNORMAL LOW (ref >60)
Glucose, Bld: 232 mg/dL — ABNORMAL HIGH (ref 70–99)
Potassium: 4.2 mmol/L (ref 3.5–5.1)
Sodium: 158 mmol/L — ABNORMAL HIGH (ref 135–145)
Total Bilirubin: 4.2 mg/dL — ABNORMAL HIGH (ref 0.3–1.2)
Total Protein: 5.8 g/dL — ABNORMAL LOW (ref 6.5–8.1)

## 2020-10-09 LAB — MAGNESIUM: Magnesium: 2 mg/dL (ref 1.7–2.4)

## 2020-10-09 LAB — CBC
HCT: 25.3 % — ABNORMAL LOW (ref 39.0–52.0)
Hemoglobin: 7.7 g/dL — ABNORMAL LOW (ref 13.0–17.0)
MCH: 28.5 pg (ref 26.0–34.0)
MCHC: 30.4 g/dL (ref 30.0–36.0)
MCV: 93.7 fL (ref 80.0–100.0)
Platelets: 179 10*3/uL (ref 150–400)
RBC: 2.7 MIL/uL — ABNORMAL LOW (ref 4.22–5.81)
RDW: 21.6 % — ABNORMAL HIGH (ref 11.5–15.5)
WBC: 10.2 10*3/uL (ref 4.0–10.5)
nRBC: 0.3 % — ABNORMAL HIGH (ref 0.0–0.2)

## 2020-10-09 LAB — PHOSPHORUS: Phosphorus: 3.3 mg/dL (ref 2.5–4.6)

## 2020-10-09 LAB — GLUCOSE, CAPILLARY
Glucose-Capillary: 197 mg/dL — ABNORMAL HIGH (ref 70–99)
Glucose-Capillary: 211 mg/dL — ABNORMAL HIGH (ref 70–99)
Glucose-Capillary: 191 mg/dL — ABNORMAL HIGH (ref 70–99)
Glucose-Capillary: 198 mg/dL — ABNORMAL HIGH (ref 70–99)
Glucose-Capillary: 233 mg/dL — ABNORMAL HIGH (ref 70–99)
Glucose-Capillary: 200 mg/dL — ABNORMAL HIGH (ref 70–99)

## 2020-10-09 MED ORDER — ZINC CHLORIDE 1 MG/ML IV SOLN
INTRAVENOUS | Status: AC
  Filled 2020-10-09: qty 1840.8

## 2020-10-09 NOTE — Progress Notes (Signed)
Nutrition Follow-up  DOCUMENTATION CODES:   Not applicable  INTERVENTION:   TNA to meet nutrition needs Last SMOF lipids 3/24; currently off due to high TG. Discussed with Pharmacy rechecking TG and giving intralipid to avoid EFAD.   Pt at high risk of malnutrition due to catabolic state.  Multiple drains in place with > 1 L output  NUTRITION DIAGNOSIS:   Increased nutrient needs related to post-op healing as evidenced by estimated needs. Ongoing.   GOAL:   Patient will meet greater than or equal to 90% of their needs Met on TNA.   MONITOR:   Vent status,Labs  REASON FOR ASSESSMENT:   Ventilator    ASSESSMENT:   Pt with a PMH of HTN, HLD, HF with reduced EF, AICD and dx mass adjacent to head of the pancreas in 2021.  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  Developed cholecystitis in Feb 2022 and had percutaneous cholecystectomy tube placement.  Presented this admission for neuroendocrine tumor resection with Whipple procedure, also had right total colectomy, colostomy and cholecystectomy on 09/13/2020.  SMV was lacerated during procedure and repaired by bovine pericardial patch.  Developed hemorrhagic shock with bile leak post-op and underwent placement of biliary T tube with abdominal washout on 3/15.    Pt discussed during ICU rounds and with RN. Per CCM [facility] evaluating for SB transplant. Sedation weaning, extubated today.  Albumin remains low indicating high inflammatory state and high risk of mortatlity. Albumin does not corralate with nutrition status.   Admission weight: 210 lb (question stated weight), current weight 275 lb. Noted severe edema present on exam.   3/14 s/p whipple; intra-operative SMV laceration s/p repair; massive transfusion protocol - 120 products; maxed on pressors  3/15 s/p washout and VAC placement  3/16 off pressors; VAC output decreasing, hematemesis with NG not functioning, OG placed 3/17 s/p re-opening of  recent laparotomy, SBR, VAC placement (per OR report pt with SB necrosis, only viable areas are stomach pb limb, 40-50 cm beyond GJ anastomosis, and colon) Pt will be TPN dependent. Concern that PB limb may become necrotic.  3/18 TPN initiated @ 45 ml/hr with plans to advance to 90 ml on 3/19 3/22 entirety of SB resected due to necrosis.  3/25 lipids removed from TPN due to elevated TG 3/30 extubated    Medications reviewed and include: SSI, protonix  Precedex   Labs reviewed: Na 158, BUN: 102, Cr: 2.62, Albumin 1.3, TG: 740 --> 613 CBG's: 166-199   Output:  UOP: 3835 ml   Drain 3: 660 ml  R drain: 265 ml  RUQ drain: 105 ml  RLQ drain: 555 ml  20 F G tube: 105 ml Total output: 1690 ml   TPN @ 130 provides: 2748 kcal and 185 grams protein   Diet Order:   Diet Order            Diet NPO time specified  Diet effective now                 EDUCATION NEEDS:   No education needs have been identified at this time  Skin:  Skin Assessment: Reviewed RN Assessment  Last BM:  none  Height:   Ht Readings from Last 1 Encounters:  09/16/2020 _0  (1.88 m)    Weight:   Wt Readings from Last 1 Encounters:  10/01/20 124.9 kg    Ideal Body Weight:     BMI:  Body mass index is 35.35 kg/m.  Estimated Nutritional Needs:   Kcal:  2800-3000  Protein:  185-210 grams  Fluid:  > 2L/day  Lockie Pares., RD, LDN, CNSC See AMiON for contact information

## 2020-10-09 NOTE — Progress Notes (Signed)
Daily Progress Note   Patient Name: [name]       Date: 10/09/2020 DOB: [birth year]  Age: 50 y.o. MRN#: [id number] Attending Physician: [name], MD Primary Care Physician: Gildardo Pounds, NP Admit Date: 09/12/2020  Reason for Consultation/Follow-up: Establishing goals of care  Subjective: Patient has been extubated. He is lethargic. He nods no to pain or shortness of breath, however, he appears diaphoretic and RR are increased.  He is not able to verbalize why he is in the hospital or any of his medical conditions. His speech is unintelligible- in [place] and then in Arabic, even with his brother at bedside.  Spoke with patient's brother outside of room. He is encouraged by the fact that his brother is waking and has been taking off of ventilator. He has no plans to change goals of care. Will accept all interventions offered. He expects that patient will be transferred to [facility] for a bowel transplant. Discussed with him that if patient begins to decline, or requires reintubation or tracheostomy it is very unlikely that [facility] will accept him for transplant. His brother requests that medical providers not discuss the patient's medical condition with him until he is more awake and alert.    Review of Systems  Unable to perform ROS: Acuity of condition    Length of Stay: 17  Current Medications: Scheduled Meds:  . sodium chloride   Intravenous Once  . chlorhexidine  15 mL Mouth Rinse BID  . Chlorhexidine Gluconate Cloth  6 each Topical Daily  . heparin injection (subcutaneous)  5,000 Units Subcutaneous Q8H  . insulin aspart  0-20 Units Subcutaneous Q4H  . mouth rinse  15 mL Mouth Rinse q12n4p  . pantoprazole (PROTONIX) IV  40 mg Intravenous BID  . sodium chloride flush   10-40 mL Intracatheter Q12H    Continuous Infusions: . sodium chloride 250 mL (10/07/20 0916)  . sodium chloride 250 mL (10/03/20 0904)  . dextrose 75 mL/hr at 10/09/20 0931  . lactated ringers Stopped (09/13/2020 0706)  . TPN ADULT (ION) 130 mL/hr at 10/09/20 0900    PRN Meds: HYDROmorphone (DILAUDID) injection, ondansetron (ZOFRAN) IV, polyvinyl alcohol, sodium chloride flush  Physical Exam Vitals and nursing note reviewed.  Constitutional:      Appearance: He is ill-appearing and diaphoretic.  Pulmonary:  Comments: Increased effort and rate Neurological:     Comments:  Lethargic, unintelligible speech             Vital Signs: BP 138/72   Pulse 89   Temp 99.1 F (37.3 C) (Oral)   Resp (!) 27   Ht 6\' 2"  (1.88 m)   Wt 124.9 kg   SpO2 95%   BMI 35.35 kg/m  SpO2: SpO2: 95 % O2 Device: O2 Device: Nasal Cannula O2 Flow Rate: O2 Flow Rate (L/min): 2 L/min  Intake/output summary:   Intake/Output Summary (Last 24 hours) at 10/09/2020 1002 Last data filed at 10/09/2020 4163 Gross per 24 hour  Intake 4046.8 ml  Output 6800 ml  Net -2753.2 ml   LBM: Last BM Date:  (PTA) Baseline Weight: Weight: 95.3 kg Most recent weight: Weight: 124.9 kg       Palliative Assessment/Data: PPS: 10%    Flowsheet Rows   Flowsheet Row Most Recent Value  Intake Tab   Referral Department Surgery  Unit at Time of Referral ICU  Date Notified 09/28/20  Palliative Care Type New Palliative care  Reason for referral Clarify Goals of Care, End of Life Care Assistance  Date of Admission 10/09/2020  Date first seen by Palliative Care 09/28/20  # of days Palliative referral response time 0 Day(s)  # of days IP prior to Palliative referral 6  Clinical Assessment   Psychosocial & Spiritual Assessment   Palliative Care Outcomes       Patient Active Problem List   Diagnosis Date Noted  . Endotracheally intubated   . Acute respiratory failure with hypoxia ([place])   . Small bowel ischemia  ([place])   . Shock circulatory ([place])   . Neuroendocrine neoplasm of gastrointestinal tract 09/15/2020  . Pancreas cancer ([place]) 09/18/2020  . Neuroendocrine tumor 09/09/2020  . Calculus of gallbladder with acute cholecystitis 08/26/2020  . Acute cholecystitis 08/26/2020  . Symptomatic cholelithiasis 08/21/2020  . Gout 07/03/2020  . Cholelithiasis 05/27/2020  . Abdominal mass 05/25/2020  . Dermatitis 02/10/2017  . Nephrolithiasis 02/10/2017  . HTN (hypertension) 09/22/2015  . Constipation 09/22/2015  . Asthma, intermittent 02/04/2014  . Ventricular fibrillation ([place]) 11/09/2013  . Environmental allergies 10/11/2013  . IFG (impaired fasting glucose) 10/11/2013  . Hyperlipidemia 10/11/2013  . Chronic systolic heart failure ([place]) 05/26/2010  . Automatic implantable cardioverter-defibrillator in situ 05/26/2010    Palliative Care Assessment & Plan   Patient Profile: 50 y.o.M with PMH of neuroendocrine tumor and underwent Whipple procedure 3/14 with intra-operative SMV laceration and repairby vascular with bovine pericardial patch. Pt had worsening shock overnight and required three pressors despite massive transfusion protocol (received 150 blood products). He was taken back to the OR for washout on 3/15 and returned to the ICU intubated. Taken back 3/17 again found to have small bowel necrosis s/p resection. Plans again to return 3/19 to OR. Palliative medicine consulted for goals of care.   Assessment/Recommendations/Plan  Continue current scope of care Family is likely to accept all offered medical interventions- recommend providers communicate honestly about patient's likelihood of transferring to [facility]  Goals of Care and Additional Recommendations: Limitations on Scope of Treatment: Full Scope Treatment  Code Status: Limited code- no defibrilation  Prognosis:  Unable to determine  Discharge Planning: To Be Determined  Care plan was discussed with patient's  brother  Thank you for allowing the Palliative Medicine Team to assist in the care of this patient.   Total time: 36 mins Greater than 50%  of  this time was spent counseling and coordinating care related to the above assessment and plan.  Mariana Kaufman, AGNP-C Palliative Medicine   Please contact Palliative Medicine Team phone at [phone] for questions and concerns.

## 2020-10-09 NOTE — Progress Notes (Signed)
Previous RN suspected that family gave patient some sips of water due to the patient having episodes of emesis. Family was notified multiple times they were not allowed to give the patient water. Family was not in the room when this RN came on shift and patient admitted to family giving him water today. Unit leadership was notified.

## 2020-10-09 NOTE — TOC Initial Note (Signed)
Transition of Care [facility]) - Initial/Assessment Note    Patient Details  Name: [name] MRN: [id number] Date of Birth: 1970-09-16  Transition of Care [facility]) CM/SW Contact:    Ella Bodo, RN Phone Number: 10/09/2020, 5:09 PM  Clinical Narrative:  50 yo male presenting with neuroendocrine tumor of the head of the pancrease. s/p right hemicolectomy, end ileostomy, Whipple and patch angioplasty repair of SMV. postoperative hemorrhagic shock with DIC with extensive small bowel necrosis. All of small bowel has been resected, with external drainage of the bile duct, pancreas and stomach. Extubated 3/30.  Prior to admission, patient independent and living at home with spouse and children.  PT/OT recommending CIR and consult has been placed.  Family able to provide 24-hour care at discharge.                  Expected Discharge Plan: IP Rehab Facility Barriers to Discharge: Continued Medical Work up          Expected Discharge Plan and Services Expected Discharge Plan: [place]   Discharge Planning Services: CM Consult   Living arrangements for the past 2 months: Single Family Home                                      Prior Living Arrangements/Services Living arrangements for the past 2 months: Single Family Home Lives with:: Spouse Patient language and need for interpreter reviewed:: Yes        Need for Family Participation in Patient Care: Yes (Comment) Care giver support system in place?: Yes (comment)             Emotional Assessment Appearance:: Appears stated age Attitude/Demeanor/Rapport: Engaged Affect (typically observed): Appropriate Orientation: : Oriented to Self,Oriented to Place,Oriented to Situation      Admission diagnosis:  Pancreas cancer ([place]) [C25.9] Neuroendocrine tumor [D3A.8] Patient Active Problem List   Diagnosis Date Noted  . Endotracheally intubated   . Acute respiratory failure with hypoxia ([place])   . Small bowel  ischemia ([place])   . Shock circulatory ([place])   . Neuroendocrine neoplasm of gastrointestinal tract 10/04/2020  . Pancreas cancer ([place]) 10/09/2020  . Neuroendocrine tumor 09/12/2020  . Calculus of gallbladder with acute cholecystitis 08/26/2020  . Acute cholecystitis 08/26/2020  . Symptomatic cholelithiasis 08/21/2020  . Gout 07/03/2020  . Cholelithiasis 05/27/2020  . Abdominal mass 05/25/2020  . Dermatitis 02/10/2017  . Nephrolithiasis 02/10/2017  . HTN (hypertension) 09/22/2015  . Constipation 09/22/2015  . Asthma, intermittent 02/04/2014  . Ventricular fibrillation ([place]) 11/09/2013  . Environmental allergies 10/11/2013  . IFG (impaired fasting glucose) 10/11/2013  . Hyperlipidemia 10/11/2013  . Chronic systolic heart failure ([place]) 05/26/2010  . Automatic implantable cardioverter-defibrillator in situ 05/26/2010   PCP:  Gildardo Pounds, NP Pharmacy:   [place], [place] [address] Phone: [phone] Fax: [phone]  [place], [place] - [address] [place] [address] Phone: [phone] Fax: [phone]  [place], [place] [address] Phone: [phone] Fax: [place], [place] [facility] [place] [address] [place] [address] Phone: [phone] Fax: [phone]     Social Determinants  of Health (SDOH) Interventions    Readmission Risk Interventions No flowsheet data found.  Reinaldo Raddle, RN, BSN  Trauma/Neuro ICU Case Manager [phone]

## 2020-10-09 NOTE — Progress Notes (Signed)
PHARMACY - TOTAL PARENTERAL NUTRITION CONSULT NOTE  Indication:  Expected prolonged ileus  Patient Measurements: Height: 6\' 2"  (188 cm) Weight: 124.9 kg (275 lb 5.7 oz) IBW/kg (Calculated) : 82.2 TPN AdjBW (KG): 95.3 Body mass index is 35.35 kg/m.  Assessment:  58 YOM with history of mass adjacent to head of the pancreas in 2021.  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  Developed cholecystitis in Feb 2022 and had percutaneous cholecystectomy tube placement.  Presented this admission for neuroendocrine tumor resection with Whipple procedure, also had right total colectomy, colostomy and cholecystectomy on 10/09/2020. SMV was lacerated during procedure and repaired by bovine pericardial patch. Developed hemorrhagic shock with bile leak post-op and underwent placement of biliary T tube with abdominal washout on 3/15 PM. Abdomen remains open. Pharmacy consulted to manage TPN.  Glucose / Insulin: no hx DM, A1c 5.2%. CBGs previously controlled <180, now elevated on D5W (HC d/c'd 3/26, so insulin in TPN reduced but CBGs remains elevated-stable).  Utilized 20 units SSI used in last 24h + 40 units in TPN Electrolytes: Na/CL 158/130 (no Na in TPN, D5W, FW deficit ~ 9L), CoCa high at 10.9 (none in TPN), others WNL Renal: AKI - SCr relatively stable at 2.58, (BL ~1-1.2), BUN improved to 96 Hepatic: LFTs moderately elevated - trend down, tbili 4.2 (scleral yellowing) Albumin 1.4, prealbumin WNL TG peaked at 740, now back down to 613 (received SMOFlipids on 3/23 and 3/24). Intake / Output; MIVF: UOP 1.3 ml/kg/hr, D5W at 50 ml/hr, drain 1850mL, net -3.6L GI Imaging: none since TPN start GI Surgeries / Procedures:  3/14 Whipple procedure with R hemicolectomy and end ileostomy, cholecystectomy 3/15 washout, VAC placement 3/17 washout, VAC replacement.  Necrosis of entire small bowel except for the PB limb and proximal 40-50cm of jejunum 3/19 re-opening laparotomy, abd closure.   Necrotic SB. 3/21 SB resection, take down of choledochojejunal anastomosis and pancreaticojejunal anastomosis, take down of duodenojejunal anastomosis, placement of externalized biliary drain / Stamm gastrostomy tube, IA washout and placement of intraperitoneal drains 3/30 extubation  Central access: R IJ CVC placed 3/15; changed to PICC placed 10/03/20 TPN start date: 10/03/2020  Nutritional Goals (RD rec on 3/30): 2800-3000 kCal, 185-210g AA, fluid >2L/day  Current Nutrition:  TPN  Plan:  - Continue TPN at 130 ml/hr - No lipids in TPN due to elevated TG (will give Intralipids bolus 2 times per week, if able, to prevent EFAD) - TPN provides 185g AA and 2858 kCal, meeting 100% of patient needs - Electrolytes in TPN: K 25 mEq/L, remove Na/Ca/Mag, Phos 12 mmol/L, max acetate - Daily multivitamin in TPN.  Remove standard trace elements and add back zinc 5mg , chromium 59mcg, selenium 78mcg  - Continue resistant SSI Q4H and increase to 50 units regular insulin in TPN  - Increase D5W to 75 ml/hr per Trauma for hypernatremia  - F/U AM labs, repeat TG in AM  [name] D. Mina Marble, PharmD, BCPS, [place] 10/09/2020, 10:24 AM

## 2020-10-09 NOTE — Progress Notes (Signed)
SLP Cancellation Note  Patient Details Name: [name] MRN: [id number] DOB: [birth year]   Cancelled treatment:       Reason Eval/Treat Not Completed: Patient not medically ready. Discussed with Dr. [place] Silos. Pt is not having a good morning today, he is cold and anxious on a cooling blanket. Maybe tomorrow try eval just for sips of water for comfort, will drain through G tube.    Mario [place], Katherene [place]onto 10/09/2020, 9:25 AM

## 2020-10-09 NOTE — Progress Notes (Signed)
Patient ID: [name], male   DOB: January 25, 1971, 50 y.o.   MRN: [id number] 10 Days Post-Op   Subjective: Cold from being on cooling blanket Denies abdominal pain ROS negative except as listed above. Objective: Vital signs in last 24 hours: Temp:  [98.9 F (37.2 C)-99.1 F (37.3 C)] 99.1 F (37.3 C) (03/31 0400) Pulse Rate:  [87-103] 95 (03/31 0800) Resp:  [17-27] 26 (03/31 0800) BP: (118-143)/(68-84) 140/80 (03/31 0800) SpO2:  [94 %-100 %] 96 % (03/31 0800) Arterial Line BP: (128-160)/(62-72) 154/64 (03/31 0800) FiO2 (%):  [32 %] 32 % (03/30 0926) Last BM Date:  (PTA)  Intake/Output from previous day: 03/30 0701 - 03/31 0700 In: 4167.8 [I.V.:4133.8; IV Piggyback:33.9] Out: 5785 [Urine:3850; Drains:1935] Intake/Output this shift: Total I/O In: 175.4 [I.V.:175.4] Out: 690 [Urine:550; Drains:140]  General appearance: cooperative Resp: clear to auscultation bilaterally Cardio: regular rate and rhythm GI: midline wound with some fascial eschar, WTD, drains working Extremities: calves soft  Lab Results: CBC  Recent Labs    10/08/20 0544 10/09/20 0411  WBC 10.2 10.2  HGB 8.0* 7.7*  HCT 26.5* 25.3*  PLT 197 179   BMET Recent Labs    10/08/20 0544 10/09/20 0411  NA 158* 158*  K 4.2 4.2  CL >130* 130*  CO2 23 23  GLUCOSE 197* 232*  BUN 102* 96*  CREATININE 2.62* 2.58*  CALCIUM 8.5* 8.8*   PT/INR No results for input(s): LABPROT, INR in the last 72 hours. ABG No results for input(s): PHART, HCO3 in the last 72 hours.  Invalid input(s): PCO2, PO2  Studies/Results: No results found.  Anti-infectives: Anti-infectives (From admission, onward)   Start     Dose/Rate Route Frequency Ordered Stop   10/01/20 1100  anidulafungin (ERAXIS) 100 mg in sodium chloride 0.9 % 100 mL IVPB  Status:  Discontinued       "Followed by" Linked Group Details   100 mg 78 mL/hr over 100 Minutes Intravenous Every 24 hours 09/30/20 1014 10/08/20 0951   09/30/20 1100   anidulafungin (ERAXIS) 200 mg in sodium chloride 0.9 % 200 mL IVPB       "Followed by" Linked Group Details   200 mg 78 mL/hr over 200 Minutes Intravenous  Once 09/30/20 1014 09/30/20 1516   09/17/2020 1600  vancomycin (VANCOREADY) IVPB 1000 mg/200 mL  Status:  Discontinued        1,000 mg 200 mL/hr over 60 Minutes Intravenous Every 24 hours 10/08/2020 1137 09/11/2020 1510   09/13/2020 1400  piperacillin-tazobactam (ZOSYN) IVPB 3.375 g  Status:  Discontinued        3.375 g 12.5 mL/hr over 240 Minutes Intravenous Every 8 hours 10/07/2020 1322 10/08/20 0952   10/04/2020 1430  vancomycin (VANCOREADY) IVPB 1750 mg/350 mL  Status:  Discontinued        1,750 mg 175 mL/hr over 120 Minutes Intravenous Every 24 hours 09/18/2020 1349 09/09/2020 1137   09/26/20 1130  vancomycin (VANCOREADY) IVPB 2000 mg/400 mL        2,000 mg 200 mL/hr over 120 Minutes Intravenous  Once 09/26/20 1031 09/26/20 1411   09/26/20 1030  vancomycin variable dose per unstable renal function (pharmacist dosing)  Status:  Discontinued         Does not apply See admin instructions 09/26/20 1031 09/28/20 0816   09/24/2020 2200  metroNIDAZOLE (FLAGYL) IVPB 500 mg  Status:  Discontinued        500 mg 100 mL/hr over 60 Minutes Intravenous Every 8 hours 10/09/2020 1820 10/08/2020  1322   10/04/2020 2000  cefTRIAXone (ROCEPHIN) 2 g in sodium chloride 0.9 % 100 mL IVPB  Status:  Discontinued        2 g 200 mL/hr over 30 Minutes Intravenous Every 24 hours 09/15/2020 1820 09/11/2020 1322   10/03/2020 1330  vancomycin (VANCOCIN) 2,250 mg in sodium chloride 0.9 % 500 mL IVPB  Status:  Discontinued        2,250 mg 250 mL/hr over 120 Minutes Intravenous Every 48 hours 10/05/2020 1228 09/18/2020 1320   09/19/2020 1300  cefTRIAXone (ROCEPHIN) 2 g in sodium chloride 0.9 % 100 mL IVPB  Status:  Discontinued        2 g 200 mL/hr over 30 Minutes Intravenous Every 24 hours 09/16/2020 1208 09/20/2020 1320   09/30/2020 1300  metroNIDAZOLE (FLAGYL) IVPB 500 mg  Status:  Discontinued         500 mg 100 mL/hr over 60 Minutes Intravenous Every 8 hours 09/28/2020 1208 10/02/2020 1320   10/03/2020 0915  metroNIDAZOLE (FLAGYL) IVPB 500 mg  Status:  Discontinued        500 mg 100 mL/hr over 60 Minutes Intravenous To Surgery 10/03/2020 0903 09/11/2020 0951   09/09/2020 0630  ceFAZolin (ANCEF) IVPB 2g/100 mL premix       "And" Linked Group Details   2 g 200 mL/hr over 30 Minutes Intravenous On call to O.R. 10/09/2020 1007 09/21/2020 1215   09/24/2020 0630  metroNIDAZOLE (FLAGYL) IVPB 500 mg       "And" Linked Group Details   500 mg 100 mL/hr over 60 Minutes Intravenous On call to O.R. 09/26/2020 1219 09/19/2020 0820      Assessment/Plan: 49M with a neuroendocrine tumor of the head of the pancreas  Pancreatic NET - POD15s/p right hemicolectomy, end ileostomy, Whipple and patch angioplasty repair of SMV. Complicated by postoperative hemorrhagic shock with DIC with extensive small bowel necrosis. All of small bowel has been resected, with external drainage of the bile duct, pancreas and stomach. - 1.8L out of drains combined, continue to monitor VDRF - PSV this AM, F/C well, extubate this AM Shock - resolved AKI - sl improved today FEN - TPN, FW added to TPN but remains hypernatremic, increase D5W to 75/h today Protein calorie malnutrition DVT - SCDs, SQH ID - s/p 10d zosyn and 7d eraxis Foley - d/c Goals of care - family desires maximal medical therapy in the hopes of a small bowel transplant. Palliative care already engaged.  Dispo - ICU   LOS: 25 days    Georganna Skeans, MD, MPH, FACS Trauma & General Surgery Use [email or web address] to contact on call provider  10/09/2020

## 2020-10-09 NOTE — Evaluation (Signed)
Occupational Therapy Evaluation Patient Details Name: [name] MRN: [id number] DOB: [birth year] Today's Date: 10/09/2020    History of Present Illness 50 yo male presenting with neuroendocrine tumor of the head of the pancrease. s/p right hemicolectomy, end ileostomy, Whipple and patch angioplasty repair of SMV. postoperative hemorrhagic shock with DIC with extensive small bowel necrosis. All of small bowel has been resected, with external drainage of the bile duct, pancreas and stomach. Extubated 3/30. PMH including AICD, heart failure, HTN, and dyslipidemia.   Clinical Impression   PTA, pt was living with his wife and was independent. Pt currently requiring Max-Total A for ADLs and bed mobility. Pt presenting with decreased strength, balance, and activity tolerance. Pt also with fever and supine in coooling blanket upon arrival; shivering throughout session. Pt motivated to participate despite pain and fatigue. Difficult to assess cognition as pt soft spoken and shivering. Pt would benefit from further acute OT to facilitate safe dc. Recommend dc to CIR for intensive OT to optimize safety, independence with ADLs, and return to PLOF.     Follow Up Recommendations  CIR    Equipment Recommendations  Other (comment) (Defer to nex tvenue)    Recommendations for Other Services PT consult;Speech consult;Rehab consult     Precautions / Restrictions Precautions Precautions: Fall      Mobility Bed Mobility Overal bed mobility: Needs Assistance Bed Mobility: Supine to Sit;Sit to Supine     Supine to sit: Total assist Sit to supine: Total assist   General bed mobility comments: Total A +2 to bring BLEs towards EOB and then elevatr trunk    Transfers                 General transfer comment: Defer due to safety    Balance Overall balance assessment: Needs assistance Sitting-balance support: No upper extremity supported;Feet supported Sitting balance-[name]:  Poor Sitting balance - Comments: Reliant on Max A for sitting balance Postural control: Posterior lean                                 ADL either performed or assessed with clinical judgement   ADL Overall ADL's : Needs assistance/impaired                                       General ADL Comments: Max-Total A for ADLs. Max hand over hand for holding yankauer and bringing to mouth     Vision Baseline Vision/History: Wears glasses Patient Visual Report: No change from baseline       Perception     Praxis      Pertinent Vitals/Pain Pain Assessment: Faces Faces Pain Scale: Hurts little more Pain Location: Generalized Pain Descriptors / Indicators: Discomfort;Grimacing Pain Intervention(s): Monitored during session;Limited activity within patient's tolerance;Repositioned     Hand Dominance Right   Extremity/Trunk Assessment Upper Extremity Assessment Upper Extremity Assessment: RUE deficits/detail;LUE deficits/detail RUE Deficits / Details: Increased edema. Weak grasp strength. Unable to bring hand to mouth. RUE Coordination: decreased fine motor;decreased gross motor LUE Deficits / Details: Increased edema. Weak grasp strength. Unable to bring hand to mouth. LUE Coordination: decreased fine motor;decreased gross motor   Lower Extremity Assessment Lower Extremity Assessment: Defer to PT evaluation   Cervical / Trunk Assessment Cervical / Trunk Assessment: Other exceptions Cervical / Trunk Exceptions: forward rounding of shoulder and poor  neck control   Communication Communication Communication: No difficulties;Other (comment) (Soft spoken)   Cognition Arousal/Alertness: Awake/alert Behavior During Therapy: WFL for tasks assessed/performed Overall Cognitive Status: Difficult to assess                                 General Comments: Followin cues. Soft spoken and difficult to understand   General Comments  VSS on 2L     Exercises     Shoulder Instructions      Home Living Family/patient expects to be discharged to:: Private residence Living Arrangements: Spouse/significant other;Children Available Help at Discharge: Family;Available 24 hours/day Type of Home: House Home Access: Stairs to enter [employer] of Steps: 3 Entrance Stairs-Rails: None Home Layout: One level     Bathroom Shower/Tub: Teacher, early years/pre: Standard     Home Equipment: None          Prior Functioning/Environment Level of Independence: Independent        Comments: ADLs, IADLs, and working        OT Problem List: Decreased strength;Decreased range of motion;Decreased activity tolerance;Impaired balance (sitting and/or standing);Decreased cognition;Decreased safety awareness;Decreased knowledge of precautions;Decreased knowledge of use of DME or AE;Decreased coordination;Pain;Impaired UE functional use;Increased edema      OT Treatment/Interventions: Self-care/ADL training;Therapeutic exercise;Energy conservation;DME and/or AE instruction;Therapeutic activities;Patient/family education    OT Goals(Current goals can be found in the care plan section)    OT Frequency: Min 2X/week   Barriers to D/C:            Co-evaluation PT/OT/SLP Co-Evaluation/Treatment: Yes Reason for Co-Treatment: For patient/therapist safety;To address functional/ADL transfers   OT goals addressed during session: ADL's and self-care      AM-PAC OT "6 Clicks" Daily Activity     Outcome Measure Help from another person eating meals?: Total Help from another person taking care of personal grooming?: A Lot Help from another person toileting, which includes using toliet, bedpan, or urinal?: Total Help from another person bathing (including washing, rinsing, drying)?: Total Help from another person to put on and taking off regular upper body clothing?: Total Help from another person to put on and taking off  regular lower body clothing?: Total 6 Click Score: 7   End of Session Equipment Utilized During Treatment: Oxygen (2L) Nurse Communication: Mobility status  Activity Tolerance: Patient limited by fatigue Patient left: in bed;with call bell/phone within reach;with bed alarm set;with family/visitor present  OT Visit Diagnosis: Unsteadiness on feet (R26.81);Other abnormalities of gait and mobility (R26.89);Muscle weakness (generalized) (M62.81);Pain Pain - part of body:  (Generalized)                Time: 3875-6433 OT Time Calculation (min): 23 min Charges:  OT General Charges $OT Visit: 1 Visit OT Evaluation $OT Eval High Complexity: 1 High  Dayane Hillenburg MSOT, OTR/L Acute Rehab Pager: [phone] Office: [place] 10/09/2020, 11:53 AM

## 2020-10-09 NOTE — Evaluation (Signed)
Physical Therapy Evaluation Patient Details Name: [name] MRN: [id number] DOB: Sep 29, 1970 Today's Date: 10/09/2020   History of Present Illness  50 yo male presenting 3/14 with neuroendocrine tumor of the head of the pancrease. s/p right hemicolectomy, end ileostomy, Whipple and patch angioplasty repair of SMV. postoperative hemorrhagic shock with DIC with extensive small bowel necrosis. All of small bowel has been resected, with external drainage of the bile duct, pancreas and stomach. Extubated 3/30. PMH including AICD, heart failure, HTN, and dyslipidemia.   Clinical Impression   PTA, pt was living with his wife and was independent. Pt currently requiring Max-Total A for bed mobility and sitting balance at EOB, but does display attempts to assist with trunk and limb movements. Pt presenting with decreased strength, balance, coordination, and activity tolerance. Pt also with fever and supine in cooling blanket upon arrival; shivering throughout session. Pt motivated to participate despite pain and fatigue. Difficult to assess cognition as pt soft spoken and shivering. Recommend d/c to CIR for intensive PT to optimize safety and independence with functional mobility as pt is young and motivated. Will continue to follow acutely.    Follow Up Recommendations CIR    Equipment Recommendations  Other (comment) (TBD as pt progresses)    Recommendations for Other Services Rehab consult     Precautions / Restrictions Precautions Precautions: Fall Precaution Comments: A-line, JP drains at abdomen Restrictions Weight Bearing Restrictions: No      Mobility  Bed Mobility Overal bed mobility: Needs Assistance Bed Mobility: Supine to Sit;Sit to Supine     Supine to sit: Total assist Sit to supine: Total assist   General bed mobility comments: Total A +2 to manage legs and trunk with supine <> sit EOB, cuing pt to assist with sliding legs off EOB but min activation noted.     Transfers                 General transfer comment: Defer due to safety  Ambulation/Gait                Stairs            Wheelchair Mobility    Modified Rankin (Stroke Patients Only)       Balance Overall balance assessment: Needs assistance Sitting-balance support: No upper extremity supported;Feet supported Sitting balance-[name]: Poor Sitting balance - Comments: Reliant on Max A for sitting balance. Cued pt to lean anteriorly, but only minor min activation noted for brief period. Postural control: Posterior lean                                   Pertinent Vitals/Pain Pain Assessment: Faces Faces Pain Scale: Hurts little more Pain Location: Generalized Pain Descriptors / Indicators: Discomfort;Grimacing Pain Intervention(s): Limited activity within patient's tolerance;Monitored during session;Repositioned    Home Living Family/patient expects to be discharged to:: Private residence Living Arrangements: Spouse/significant other;Children Available Help at Discharge: Family;Available 24 hours/day Type of Home: House Home Access: Stairs to enter Entrance Stairs-Rails: None Entrance Stairs-Number of Steps: 3 Home Layout: One level Home Equipment: None      Prior Function Level of Independence: Independent         Comments: ADLs, IADLs, and working     Hand Dominance   Dominant Hand: Right    Extremity/Trunk Assessment   Upper Extremity Assessment Upper Extremity Assessment: Defer to OT evaluation RUE Deficits / Details: Increased edema. Weak grasp  strength. Unable to bring hand to mouth. RUE Coordination: decreased fine motor;decreased gross motor LUE Deficits / Details: Increased edema. Weak grasp strength. Unable to bring hand to mouth. LUE Coordination: decreased fine motor;decreased gross motor    Lower Extremity Assessment Lower Extremity Assessment: Generalized weakness (edema noted distally, difficulty  lifting legs or sliding legs laterally on bed)    Cervical / Trunk Assessment Cervical / Trunk Assessment: Other exceptions Cervical / Trunk Exceptions: forward rounding of shoulder and poor neck control  Communication   Communication: No difficulties;Other (comment) (Soft spoken)  Cognition Arousal/Alertness: Awake/alert Behavior During Therapy: WFL for tasks assessed/performed Overall Cognitive Status: Difficult to assess                                 General Comments: Followin cues. Soft spoken and difficult to understand      General Comments General comments (skin integrity, edema, etc.): VSS on 2L    Exercises     Assessment/Plan    PT Assessment Patient needs continued PT services  PT Problem List Decreased strength;Decreased range of motion;Decreased activity tolerance;Decreased balance;Decreased mobility;Decreased coordination;Decreased knowledge of use of DME;Decreased safety awareness;Decreased knowledge of precautions;Pain       PT Treatment Interventions DME instruction;Gait training;Stair training;Functional mobility training;Therapeutic activities;Therapeutic exercise;Neuromuscular re-education;Balance training;Cognitive remediation;Patient/family education    PT Goals (Current goals can be found in the Care Plan section)  Acute Rehab PT Goals Patient Stated Goal: to get warm and try to sit up PT Goal Formulation: With patient/family Time For Goal Achievement: 10/23/20 Potential to Achieve Goals: Good    Frequency Min 3X/week   Barriers to discharge        Co-evaluation PT/OT/SLP Co-Evaluation/Treatment: Yes Reason for Co-Treatment: For patient/therapist safety;To address functional/ADL transfers PT goals addressed during session: Mobility/safety with mobility;Balance OT goals addressed during session: ADL's and self-care       AM-PAC PT "6 Clicks" Mobility  Outcome Measure Help needed turning from your back to your side while in a  flat bed without using bedrails?: Total Help needed moving from lying on your back to sitting on the side of a flat bed without using bedrails?: Total Help needed moving to and from a bed to a chair (including a wheelchair)?: Total Help needed standing up from a chair using your arms (e.g., wheelchair or bedside chair)?: Total Help needed to walk in hospital room?: Total Help needed climbing 3-5 steps with a railing? : Total 6 Click Score: 6    End of Session Equipment Utilized During Treatment: Oxygen Activity Tolerance: Patient limited by fatigue;Patient limited by pain Patient left: in bed;with call bell/phone within reach;with bed alarm set;with family/visitor present Nurse Communication: Mobility status PT Visit Diagnosis: Muscle weakness (generalized) (M62.81);Difficulty in walking, not elsewhere classified (R26.2);Pain Pain - part of body:  (generalized, likely abdomen)    Time: 2706-2376 PT Time Calculation (min) (ACUTE ONLY): 23 min   Charges:   PT Evaluation $PT Eval High Complexity: 1 High          [name], PT, DPT Acute Rehabilitation Services  Pager: [phone] Office: [phone]   Orvan Falconer 10/09/2020, 1:48 PM

## 2020-10-09 NOTE — Progress Notes (Signed)
Inpatient Rehab Admissions Coordinator Note:   Per therapy recommendations, pt was screened for CIR candidacy by Shann Medal, PT, DPT.  At this time we are recommending a CIR consult and I will place an order per our protocol.  Please contact me with questions.   Shann Medal, PT, DPT [phone] 10/09/20 3:51 PM

## 2020-10-10 LAB — CBC
HCT: 25.7 % — ABNORMAL LOW (ref 39.0–52.0)
Hemoglobin: 7.9 g/dL — ABNORMAL LOW (ref 13.0–17.0)
MCH: 28.3 pg (ref 26.0–34.0)
MCHC: 30.7 g/dL (ref 30.0–36.0)
MCV: 92.1 fL (ref 80.0–100.0)
Platelets: 195 10*3/uL (ref 150–400)
RBC: 2.79 MIL/uL — ABNORMAL LOW (ref 4.22–5.81)
RDW: 21.5 % — ABNORMAL HIGH (ref 11.5–15.5)
WBC: 10 10*3/uL (ref 4.0–10.5)
nRBC: 0.3 % — ABNORMAL HIGH (ref 0.0–0.2)

## 2020-10-10 LAB — COMPREHENSIVE METABOLIC PANEL
ALT: 95 U/L — ABNORMAL HIGH (ref 0–44)
AST: 66 U/L — ABNORMAL HIGH (ref 15–41)
Albumin: 1.4 g/dL — ABNORMAL LOW (ref 3.5–5.0)
Alkaline Phosphatase: 75 U/L (ref 38–126)
Anion gap: 5 (ref 5–15)
BUN: 87 mg/dL — ABNORMAL HIGH (ref 6–20)
CO2: 20 mmol/L — ABNORMAL LOW (ref 22–32)
Calcium: 8.7 mg/dL — ABNORMAL LOW (ref 8.9–10.3)
Chloride: 130 mmol/L — ABNORMAL HIGH (ref 98–111)
Creatinine, Ser: 2.42 mg/dL — ABNORMAL HIGH (ref 0.61–1.24)
GFR, Estimated: 32 mL/min — ABNORMAL LOW (ref >60)
Glucose, Bld: 265 mg/dL — ABNORMAL HIGH (ref 70–99)
Potassium: 3.9 mmol/L (ref 3.5–5.1)
Sodium: 155 mmol/L — ABNORMAL HIGH (ref 135–145)
Total Bilirubin: 4.3 mg/dL — ABNORMAL HIGH (ref 0.3–1.2)
Total Protein: 6.2 g/dL — ABNORMAL LOW (ref 6.5–8.1)

## 2020-10-10 LAB — GLUCOSE, CAPILLARY
Glucose-Capillary: 236 mg/dL — ABNORMAL HIGH (ref 70–99)
Glucose-Capillary: 223 mg/dL — ABNORMAL HIGH (ref 70–99)
Glucose-Capillary: 208 mg/dL — ABNORMAL HIGH (ref 70–99)
Glucose-Capillary: 188 mg/dL — ABNORMAL HIGH (ref 70–99)
Glucose-Capillary: 157 mg/dL — ABNORMAL HIGH (ref 70–99)
Glucose-Capillary: 195 mg/dL — ABNORMAL HIGH (ref 70–99)

## 2020-10-10 LAB — TRIGLYCERIDES: Triglycerides: 259 mg/dL — ABNORMAL HIGH (ref <150)

## 2020-10-10 MED ORDER — SODIUM CHLORIDE 0.9 % IV SOLN
12.5000 mg | Freq: Four times a day (QID) | INTRAVENOUS | Status: DC | PRN
  Administered 2020-10-10 (×2): 12.5 mg via INTRAVENOUS
  Filled 2020-10-10 (×2): qty 0.5

## 2020-10-10 MED ORDER — FAT EMULSION PLANT BASED 20% (INTRALIPID)IV EMUL
250.0000 mL | INTRAVENOUS | Status: AC
  Filled 2020-10-10: qty 250

## 2020-10-10 MED ORDER — SODIUM CHLORIDE 0.9 % IV SOLN
6.2500 mg | Freq: Four times a day (QID) | INTRAVENOUS | Status: DC | PRN
  Administered 2020-10-18 – 2020-11-24 (×43): 6.25 mg via INTRAVENOUS
  Filled 2020-10-10 (×48): qty 0.25

## 2020-10-10 MED ORDER — INSULIN ASPART 100 UNIT/ML ~~LOC~~ SOLN
2.0000 [IU] | SUBCUTANEOUS | Status: DC
  Administered 2020-10-10 – 2020-10-11 (×6): 2 [IU] via SUBCUTANEOUS

## 2020-10-10 MED ORDER — ZINC CHLORIDE 1 MG/ML IV SOLN
INTRAVENOUS | Status: AC
  Filled 2020-10-10: qty 1840.8

## 2020-10-10 NOTE — Progress Notes (Signed)
Patient shaking through entire body despite cooling blanket being off for about 3 hours. T- 98.7, CBG 200s. Blanket placed on patient, no change. Trauma paged.

## 2020-10-10 NOTE — Progress Notes (Signed)
Trauma/Critical Care Follow Up Note  Subjective:    Overnight Issues:   Objective:  Vital signs for last 24 hours: Temp:  [99.3 F (37.4 C)-100.6 F (38.1 C)] 100.6 F (38.1 C) (03/31 2200) Pulse Rate:  [77-99] 98 (04/01 0900) Resp:  [19-30] 23 (04/01 0900) BP: (117-150)/(64-117) 139/80 (04/01 0900) SpO2:  [93 %-100 %] 96 % (04/01 0900) Arterial Line BP: (136-174)/(67-96) 152/73 (04/01 0900)  Hemodynamic parameters for last 24 hours:    Intake/Output from previous day: 03/31 0701 - 04/01 0700 In: 4812.3 [I.V.:4760.4; IV Piggyback:51.9] Out: 5580 [Urine:4000; Drains:1580]  Intake/Output this shift: Total I/O In: 510 [I.V.:485; Other:25] Out: -   Vent settings for last 24 hours:    Physical Exam:  Gen: comfortable, no distress Neuro: non-focal exam, conversant, but weak, oriented to self and place HEENT: PERRL Neck: supple CV: RRR Pulm: unlabored breathing Abd: soft, NT, drains in place and functional GU: clear yellow urine Extr: wwp   Results for orders placed or performed during the hospital encounter of 09/16/2020 (from the past 24 hour(s))  Glucose, capillary     Status: Abnormal   Collection Time: 10/09/20 11:56 AM  Result Value Ref Range   Glucose-Capillary 191 (H) 70 - 99 mg/dL  Glucose, capillary     Status: Abnormal   Collection Time: 10/09/20  4:20 PM  Result Value Ref Range   Glucose-Capillary 198 (H) 70 - 99 mg/dL  Glucose, capillary     Status: Abnormal   Collection Time: 10/09/20  7:42 PM  Result Value Ref Range   Glucose-Capillary 233 (H) 70 - 99 mg/dL  Glucose, capillary     Status: Abnormal   Collection Time: 10/09/20 11:19 PM  Result Value Ref Range   Glucose-Capillary 200 (H) 70 - 99 mg/dL  Glucose, capillary     Status: Abnormal   Collection Time: 10/10/20  3:45 AM  Result Value Ref Range   Glucose-Capillary 236 (H) 70 - 99 mg/dL  Comprehensive metabolic panel     Status: Abnormal   Collection Time: 10/10/20  3:51 AM  Result  Value Ref Range   Sodium 155 (H) 135 - 145 mmol/L   Potassium 3.9 3.5 - 5.1 mmol/L   Chloride 130 (H) 98 - 111 mmol/L   CO2 20 (L) 22 - 32 mmol/L   Glucose, Bld 265 (H) 70 - 99 mg/dL   BUN 87 (H) 6 - 20 mg/dL   Creatinine, Ser 2.42 (H) 0.61 - 1.24 mg/dL   Calcium 8.7 (L) 8.9 - 10.3 mg/dL   Total Protein 6.2 (L) 6.5 - 8.1 g/dL   Albumin 1.4 (L) 3.5 - 5.0 g/dL   AST 66 (H) 15 - 41 U/L   ALT 95 (H) 0 - 44 U/L   Alkaline Phosphatase 75 38 - 126 U/L   Total Bilirubin 4.3 (H) 0.3 - 1.2 mg/dL   GFR, Estimated 32 (L) >60 mL/min   Anion gap 5 5 - 15  CBC     Status: Abnormal   Collection Time: 10/10/20  3:51 AM  Result Value Ref Range   WBC 10.0 4.0 - 10.5 K/uL   RBC 2.79 (L) 4.22 - 5.81 MIL/uL   Hemoglobin 7.9 (L) 13.0 - 17.0 g/dL   HCT 25.7 (L) 39.0 - 52.0 %   MCV 92.1 80.0 - 100.0 fL   MCH 28.3 26.0 - 34.0 pg   MCHC 30.7 30.0 - 36.0 g/dL   RDW 21.5 (H) 11.5 - 15.5 %   Platelets 195 150 -  400 K/uL   nRBC 0.3 (H) 0.0 - 0.2 %  Triglycerides     Status: Abnormal   Collection Time: 10/10/20  3:51 AM  Result Value Ref Range   Triglycerides 259 (H) <150 mg/dL  Glucose, capillary     Status: Abnormal   Collection Time: 10/10/20  8:19 AM  Result Value Ref Range   Glucose-Capillary 223 (H) 70 - 99 mg/dL    Assessment & Plan: The plan of care was discussed with the bedside nurse for the day, who is in agreement with this plan and no additional concerns were raised.   Present on Admission: . Neuroendocrine neoplasm of gastrointestinal tract . Calculus of gallbladder with acute cholecystitis . Pancreas cancer ([place]) . Neuroendocrine tumor    LOS: 18 days   Additional comments:I reviewed the patient's new clinical lab test results.   and I reviewed the patients new imaging test results.    66M with a neuroendocrine tumor of the head of the pancreas  Pancreatic NET - POD15s/p right hemicolectomy, end ileostomy, Whipple and patch angioplasty repair of SMV. Complicated by  postoperative hemorrhagic shock with DIC with extensive small bowel necrosis. All of small bowel has been resected, with external drainage of the bile duct, pancreas and stomach. - 1.6L out of drains combined, continue to monitor VDRF- extubated 3/30 Shock- resolved AKI - stable FEN - TPN, FW added to TPN but remains hypernatremic,D5W to 75/h yest and slightly improved, continue Protein calorie malnutrition DVT - SCDs, SQH ID- s/p 10d zosyn and 7d eraxis Foley - removed Goals of care- family desires maximal medical therapy in the hopes of a small bowel transplant. Palliative care already engaged.  [place], MD Trauma & General Surgery Please use [email or web address] to contact on call provider  10/10/2020  *Care during the described time interval was provided by me. I have reviewed this patient's available data, including medical history, events of note, physical examination and test results as part of my evaluation.

## 2020-10-10 NOTE — Progress Notes (Signed)
Inpatient Rehab Admissions:  Inpatient Rehab Consult received.  I met with patient at the bedside for rehabilitation assessment and to discuss goals and expectations of an inpatient rehab admission.  Pt acknowledged understanding.  Pt indicated that he is interested in pursuing CIR.  Pt soft spoken and difficult to understand. Pt gave permission to contact wife, [name].  Called wife who told AC she would be coming to hospital today and would call AC when she arrived.  Will continue to follow.  Signed: Lauren Graves Madden, MS, CCC-SLP Admissions Coordinator [phone]   

## 2020-10-10 NOTE — Progress Notes (Signed)
PHARMACY - TOTAL PARENTERAL NUTRITION CONSULT NOTE  Indication:  Expected prolonged ileus  Patient Measurements: Height: 6\' 2"  (188 cm) Weight: 124.9 kg (275 lb 5.7 oz) IBW/kg (Calculated) : 82.2 TPN AdjBW (KG): 95.3 Body mass index is 35.35 kg/m.  Assessment:  19 YOM with history of mass adjacent to head of the pancreas in 2021.  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  Developed cholecystitis in Feb 2022 and had percutaneous cholecystectomy tube placement.  Presented this admission for neuroendocrine tumor resection with Whipple procedure, also had right total colectomy, colostomy and cholecystectomy on 09/26/2020. SMV was lacerated during procedure and repaired by bovine pericardial patch. Developed hemorrhagic shock with bile leak post-op and underwent placement of biliary T tube with abdominal washout on 3/15 PM. Abdomen remains open. Pharmacy consulted to manage TPN.  Glucose / Insulin: no hx DM, A1c 5.2%. CBGs previously controlled <180, now elevated 200-260 on D5W @ 75 (HC d/c'd 3/26, so insulin in TPN reduced but CBGs remains elevated-stable).  Utilized 18 units SSI in the past 12 hours since incr to 50 units in TPN. Will add 2 units q4h (hold for CBG<150) and adjust prn to help manage CBGs with D5W Electrolytes: Na/CL 155 (no Na in TPN, D5W, FW deficit ~ 9L), Cl high, HCO3 low (on max acetate) CoCa high at 10.8 (none in TPN), K 3.9 Renal: AKI - SCr down 2.42 << 2.58, (BL ~1-1.2), BUN down 87 << 96 Hepatic: LFTs mildly elevated - trend down, tbili 4.3 (scleral yellowing) Albumin 1.4, prealbumin 25.1 (3/28) TG peaked at 740, now back down to 259 on 4/1 (received SMOFlipids on 3/23 and 3/24) - will attempt to supplement EFA 100g/wk and monitor TG trends Intake / Output; MIVF: UOP 1.3 ml/kg/hr, emesis x 3 charted, D5W at 75 ml/hr, drain (4 total): 1510 cc/24h, 140 cc from gastromy, net -3.9L GI Imaging: none since TPN start GI Surgeries / Procedures:  3/14  Whipple procedure with R hemicolectomy and end ileostomy, cholecystectomy 3/15 washout, VAC placement 3/17 washout, VAC replacement.  Necrosis of entire small bowel except for the PB limb and proximal 40-50cm of jejunum 3/19 re-opening laparotomy, abd closure.  Necrotic SB. 3/21 SB resection, take down of choledochojejunal anastomosis and pancreaticojejunal anastomosis, take down of duodenojejunal anastomosis, placement of externalized biliary drain / Stamm gastrostomy tube, IA washout and placement of intraperitoneal drains 3/30 extubation  Central access: R IJ CVC placed 3/15; changed to PICC placed 10/03/20 TPN start date: 09/26/2020  Nutritional Goals (RD rec on 3/30): 2800-3000 kCal, 185-210g AA, fluid >2L/day  Current Nutrition:  TPN  Plan:  - Continue TPN at 130 ml/hr - Will give intralipid 20% 250 cc infused over 12 hours (~50g lipids) today and attempt to supplement 2x/wk for now while monitoring TG trends (to prevent EFAD)  - TPN with intralipid 2x/wk provides a daily average of 85g AA and 2983 kCal, meeting 100% of patient needs - Electrolytes in TPN: K 25 mEq/L, remove Na/Ca/Mag, Phos 12 mmol/L, max acetate - Daily multivitamin in TPN.  Remove standard trace elements and add back zinc 5mg , chromium 58mcg, selenium 63mcg  - Continue resistant SSI Q4H, add 2 units novolog q4h (hold for CBG<150), and continue with 50 units regular insulin in TPN  - D5W at 75 ml/hr per Trauma for hypernatremia  - F/U AM labs, will add standing TG for Mon/Thur  Thank you for allowing pharmacy to be a part of this patient's care.  Alycia Rossetti, PharmD, BCPS  Clinical Pharmacist Clinical phone for 10/10/2020: [phone] 10/10/2020 9:17 AM   **Pharmacist phone directory can now be found on [email or web address] ([email or web address]).  Listed under [place].

## 2020-10-10 NOTE — Progress Notes (Signed)
Patient had an emesis around 0100, zofran given. Second emesis around 0330, trauma paged.

## 2020-10-10 NOTE — Progress Notes (Signed)
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 s/p right hemicolectomy, end ileostomy, Whipple and patch angioplasty repair of SMV. postoperative hemorrhagic shock with DIC with extensive small bowel necrosis. All of small bowel has been resected, with external drainage of the bile duct, pancreas and stomach. Extubated 3/30. PMH including AICD, heart failure, HTN, and dyslipidemia.    PT Comments    Pt very willing to participate and motivated to improve. Pt attempted to assist with pulling self to upright sitting position from lying back on elevated HOB with bed in egress position, maxAx2. Once sitting up he was able to maintain static balance with min-modA today for brief periods of time before he fatigued. Attempted to activate UE and lower extremity muscles, but pt with poor activation. Needing AAROM to facilitate bil UEs and pt unable to lift legs off bed to try LAQ. Due to inability to activate quads enough to perform LAQ, held off on standing as he is at high risk for falls. Will continue to follow acutely. Current recommendations remain appropriate.    Follow Up Recommendations  CIR     Equipment Recommendations  Other (comment) (TBD as pt progresses)    Recommendations for Other Services       Precautions / Restrictions Precautions Precautions: Fall Precaution Comments: x2 JP drains at abdomen and x2 biliary drains Restrictions Weight Bearing Restrictions: No    Mobility  Bed Mobility Overal bed mobility: Needs Assistance Bed Mobility: Supine to Sit     Supine to sit: Max assist;+2 for physical assistance;+2 for safety/equipment     General bed mobility comments: Bed in egress position, cuing pt to pull self anteriorly to sit up with pt attempting to do so but needing maxAx2.     Transfers                 General transfer comment: Defer due to safety  Ambulation/Gait                 Stairs             Wheelchair Mobility    Modified Rankin (Stroke Patients Only)       Balance Overall balance assessment: Needs assistance Sitting-balance support: Feet supported;Bilateral upper extremity supported Sitting balance-[name]: Poor Sitting balance - Comments: Min-modA for static sitting balance with UE support in egress position. Postural control: Posterior lean                                  Cognition Arousal/Alertness: Awake/alert Behavior During Therapy: WFL for tasks assessed/performed Overall Cognitive Status: Difficult to assess                                 General Comments: Followin cues. Soft spoken and difficult to understand      Exercises General Exercises - Upper Extremity Shoulder Flexion: Strengthening;AAROM;Both;5 reps;Seated Elbow Flexion: Right;5 reps;Seated;Strengthening;AAROM Elbow Extension: AAROM;Strengthening;Right;5 reps;Seated General Exercises - Lower Extremity Ankle Circles/Pumps: Strengthening;Both;AAROM;5 reps;Supine Long Arc Quad:  (attempted but unsuccessful in clearing leg from bed)    General Comments        Pertinent Vitals/Pain Pain Assessment: Faces Faces Pain Scale: Hurts little more Pain Location: Generalized Pain Descriptors /  Indicators: Discomfort;Grimacing Pain Intervention(s): Limited activity within patient's tolerance;Monitored during session;Repositioned    Home Living                      Prior Function            PT Goals (current goals can now be found in the care plan section) Acute Rehab PT Goals Patient Stated Goal: to sit up PT Goal Formulation: With patient/family Time For Goal Achievement: 10/23/20 Potential to Achieve Goals: Good Progress towards PT goals: Progressing toward goals    Frequency    Min  3X/week      PT Plan Current plan remains appropriate    Co-evaluation              AM-PAC PT "6 Clicks" Mobility   Outcome Measure  Help needed turning from your back to your side while in a flat bed without using bedrails?: Total Help needed moving from lying on your back to sitting on the side of a flat bed without using bedrails?: Total Help needed moving to and from a bed to a chair (including a wheelchair)?: Total Help needed standing up from a chair using your arms (e.g., wheelchair or bedside chair)?: Total Help needed to walk in hospital room?: Total Help needed climbing 3-5 steps with a railing? : Total 6 Click Score: 6    End of Session Equipment Utilized During Treatment: Oxygen Activity Tolerance: Patient limited by fatigue;Patient limited by pain Patient left: in bed;with call bell/phone within reach;with bed alarm set Nurse Communication: Mobility status PT Visit Diagnosis: Muscle weakness (generalized) (M62.81);Difficulty in walking, not elsewhere classified (R26.2);Pain Pain - part of body:  (generalized, likely abdomen)     Time: 3833-3832 PT Time Calculation (min) (ACUTE ONLY): 27 min  Charges:  $Neuromuscular Re-education: 23-37 mins                     [name], PT, DPT Acute Rehabilitation Services  Pager: [phone] Office: [place] 10/10/2020, 3:54 PM

## 2020-10-10 NOTE — Evaluation (Addendum)
Clinical/Bedside Swallow Evaluation Patient Details  Name: [name] MRN: [id number] Date of Birth: 02-22-1971  Today's Date: 10/10/2020 Time: SLP Start Time (ACUTE ONLY): 0903 SLP Stop Time (ACUTE ONLY): 0925 SLP Time Calculation (min) (ACUTE ONLY): 22 min  Past Medical History:  Past Medical History:  Diagnosis Date  . AICD (automatic cardioverter/defibrillator) present   . Dyslipidemia   . Heart failure with mildly reduced EF    EF 35-40 in 2011 // Echo 06/2019: EF 45-50, Gr 2 DD, RAE, mild MR, mild TR, mild PI, RVSP 33.6  . History of kidney stones   . HTN (hypertension)   . Hx of ventricular fibrillation    Hx of VFib arrest s/p AICD placement  . Nonischemic cardiomyopathy    Past Surgical History:  Past Surgical History:  Procedure Laterality Date  . APPLICATION OF WOUND VAC N/A 10/09/2020   Procedure: APPLICATION OF WOUND VAC;  Surgeon: [name], MD;  Location: [place];  Service: General;  Laterality: N/A;  . APPLICATION OF WOUND VAC N/A 10/09/2020   Procedure: APPLICATION OF WOUND VAC;  Surgeon: [name], MD;  Location: [place];  Service: General;  Laterality: N/A;  . APPLICATION OF WOUND VAC  09/24/2020   Procedure: APPLICATION OF WOUND VAC; PLACEMENT OF NEGATIVE PRESSURE DRESSING;  Surgeon: [name], MD;  Location: [place];  Service: General;;  . BOWEL RESECTION  09/13/2020   Procedure: SMALL BOWEL RESECTION;  Surgeon: [name], MD;  Location: [place];  Service: General;;  . BOWEL RESECTION N/A 10/08/2020   Procedure: SMALL BOWEL RESECTION;  Surgeon: [name], MD;  Location: [place];  Service: General;  Laterality: N/A;  . CARDIAC DEFIBRILLATOR PLACEMENT    . CHOLECYSTECTOMY N/A 09/17/2020   Procedure: CHOLECYSTECTOMY;  Surgeon: [name], MD;  Location: [place];  Service: General;  Laterality: N/A;  . COLECTOMY N/A 10/02/2020   Procedure: RIGHT TOTAL COLECTOMY;  Surgeon: [name], MD;  Location: [place];  Service: General;  Laterality:  N/A;  . COLOSTOMY  09/21/2020   Procedure: COLOSTOMY;  Surgeon: [name], MD;  Location: [place];  Service: General;;  . ERCP N/A 05/27/2020   Procedure: ENDOSCOPIC RETROGRADE CHOLANGIOPANCREATOGRAPHY (ERCP);  Surgeon: Clarene Essex, MD;  Location: [place];  Service: Endoscopy;  Laterality: N/A;  . ESOPHAGOGASTRODUODENOSCOPY (EGD) WITH PROPOFOL N/A 06/25/2020   Procedure: ESOPHAGOGASTRODUODENOSCOPY (EGD) WITH PROPOFOL;  Surgeon: Arta Silence, MD;  Location: [facility];  Service: Endoscopy;  Laterality: N/A;  . FINE NEEDLE ASPIRATION N/A 06/25/2020   Procedure: FINE NEEDLE ASPIRATION (FNA) LINEAR;  Surgeon: Arta Silence, MD;  Location: [facility];  Service: Endoscopy;  Laterality: N/A;  . GASTROSTOMY N/A 09/20/2020   Procedure: INSERTION OF GASTROSTOMY TUBE;  Surgeon: [name], MD;  Location: [place];  Service: General;  Laterality: N/A;  . IR ANGIOGRAM SELECTIVE EACH ADDITIONAL VESSEL  09/28/2020  . IR ANGIOGRAM SELECTIVE EACH ADDITIONAL VESSEL  09/14/2020  . IR ANGIOGRAM SELECTIVE EACH ADDITIONAL VESSEL  10/09/2020  . IR ANGIOGRAM VISCERAL SELECTIVE  09/15/2020  . IR PERC CHOLECYSTOSTOMY  08/27/2020  . LAPAROTOMY N/A 09/13/2020   Procedure: REOPENING OF LAPAROTOMY ABDOMINAL WASHOUT;  Surgeon: [name], MD;  Location: [place];  Service: General;  Laterality: N/A;  . LAPAROTOMY N/A 09/18/2020   Procedure: RE-EXPLORATORY LAPAROTOMY;  Surgeon: [name], MD;  Location: [place];  Service: General;  Laterality: N/A;  . LAPAROTOMY N/A 09/10/2020   Procedure: RE-EXPLORATORY LAPAROTOMY; ABDOMINAL WASHOUT;  Surgeon:  [name], MD;  Location: [place];  Service: General;  Laterality: N/A;  . LAPAROTOMY N/A 09/24/2020   Procedure: RE -EXPLORATORY LAPAROTOMY AND [place];  Surgeon: [name], MD;  Location: [place];  Service: General;  Laterality: N/A;  . LAPAROTOMY N/A 09/20/2020   Procedure: RE-EXPLORATORY LAPAROTOMY;  Surgeon: [name], MD;  Location: [place];   Service: General;  Laterality: N/A;  . PACEMAKER IMPLANT    . PATCH ANGIOPLASTY N/A 09/30/2020   Procedure: PATCH ANGIOPLASTY SUPERIOR MESENTERIC VEIN;  Surgeon: Elam Dutch, MD;  Location: [facility];  Service: Vascular;  Laterality: N/A;  . RADIOLOGY WITH ANESTHESIA N/A 09/18/2020   Procedure: RADIOLOGY WITH ANESTHESIA;  Surgeon: Radiologist, Medication, MD;  Location: [place];  Service: Radiology;  Laterality: N/A;  . REMOVAL OF STONES  05/27/2020   Procedure: REMOVAL OF STONES;  Surgeon: Clarene Essex, MD;  Location: [facility] ENDOSCOPY;  Service: Endoscopy;;  . repeat echo  2008    demonstrated near normalization  . SPHINCTEROTOMY  05/27/2020   Procedure: SPHINCTEROTOMY;  Surgeon: Clarene Essex, MD;  Location: [facility] ENDOSCOPY;  Service: Endoscopy;;  . TRANSTHORACIC ECHOCARDIOGRAM  11/09/2005, 01/15/2009, 02/07/2010  . UPPER ESOPHAGEAL ENDOSCOPIC ULTRASOUND (EUS) N/A 06/25/2020   Procedure: UPPER ESOPHAGEAL ENDOSCOPIC ULTRASOUND (EUS);  Surgeon: Arta Silence, MD;  Location: [facility];  Service: Endoscopy;  Laterality: N/A;  . WHIPPLE PROCEDURE N/A 10/06/2020   Procedure: WHIPPLE PROCEDURE;  Surgeon: [name], MD;  Location: [place];  Service: General;  Laterality: N/A;  ROOM 2 STARTING AT 07:30AM FOR 380 MIN   HPI:  9 yr with history of mass adjacent to head of the pancreas in 2021.  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  Developed cholecystitis in Feb 2022 and had percutaneous cholecystectomy tube placement.  Presented this admission for neuroendocrine tumor resection with Whipple procedure, also had right total colectomy, colostomy and cholecystectomy on 09/14/2020. SMV was lacerated during procedure and repaired by bovine pericardial patch. Developed hemorrhagic shock with bile leak post-op and underwent placement of biliary T tube with abdominal washout on 3/15 PM. All of small bowel has been resected, with external drainage of the bile duct, pancreas and stomach.  Pt on TPN. intubated 3/14-3/30.   Assessment / Plan / Recommendation Clinical Impression   Pt with complicated hospital course seen for swallow assessment per MD for comfort with s/s aspiration. He does not have small bowel and anything he takes po will be elimiated via G-tube. He is significantly weak with minimal respiratory effort and produces throat clear in attempts to cough. Grimaces while suctioned and affirms throat pain. His neck appears and feels edematous on examination and palpation and vocal quality is intermittent and minimal vocalization with attempts. There were immediate and delayed throat clears after ice chips, 1/2 teaspoon water, 1/3 tsp applejuice (mainly a taste). Multiple swallows. No additional po's given due to needing thin liquids from GI source and risk of increased viscosity. Dr. Bobbye Morton prefers strict NPO (no ice chips/water) at this time. ST will continue treatment for goal of comfort feeds in near future.   SLP Visit Diagnosis: Dysphagia, unspecified (R13.10)    Aspiration Risk  Moderate aspiration risk;Severe aspiration risk    Diet Recommendation NPO   Medication Administration: Via alternative means    Other  Recommendations Oral Care Recommendations: Oral care QID   Follow up Recommendations 24 hour supervision/assistance;Skilled Nursing facility      Frequency and Duration min 1 x/week  2 weeks       Prognosis Prognosis for Safe Diet Advancement:  (fair) Barriers to Reach Goals: Severity of deficits      Swallow Study   General HPI: 86 yr with history of mass adjacent to head of the pancreas in 2021.  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  Developed cholecystitis in Feb 2022 and had percutaneous cholecystectomy tube placement.  Presented this admission for neuroendocrine tumor resection with Whipple procedure, also had right total colectomy, colostomy and cholecystectomy on 09/13/2020. SMV was lacerated during procedure  and repaired by bovine pericardial patch. Developed hemorrhagic shock with bile leak post-op and underwent placement of biliary T tube with abdominal washout on 3/15 PM. All of small bowel has been resected, with external drainage of the bile duct, pancreas and stomach. Pt on TPN. intubated 3/14-3/30. Type of Study: Bedside Swallow Evaluation Previous Swallow Assessment: none Diet Prior to this Study: NPO;TNA Temperature Spikes Noted: No Respiratory Status: Room air History of Recent Intubation: Yes Length of Intubations (days): 15 days Date extubated: 10/08/20 Behavior/Cognition: Cooperative;Pleasant mood;Requires cueing (awake) Oral Cavity Assessment: Dry Oral Care Completed by SLP: Yes Oral Cavity - Dentition: Adequate natural dentition Vision: Functional for self-feeding Self-Feeding Abilities: Total assist Patient Positioning: Upright in bed Baseline Vocal Quality: Low vocal intensity;Hoarse Volitional Cough: Other (Comment) (unable- was throat clear) Volitional Swallow:  (not observed)    Oral/Motor/Sensory Function Overall Oral Motor/Sensory Function: Generalized oral weakness   Ice Chips Ice chips: Impaired Presentation: Spoon Oral Phase Impairments: Reduced lingual movement/coordination Pharyngeal Phase Impairments:  (swallow not observed)   Thin Liquid Thin Liquid: Impaired Presentation: Cup;Spoon Oral Phase Impairments: Reduced lingual movement/coordination Oral Phase Functional Implications: Oral holding (mild) Pharyngeal  Phase Impairments: Throat Clearing - Immediate;Throat Clearing - Delayed    Nectar Thick Nectar Thick Liquid: Not tested   Honey Thick Honey Thick Liquid: Not tested   Puree Puree: Not tested   Solid     Solid: Not tested      Houston Siren 10/10/2020,10:00 AM Orbie Pyo Colvin Caroli.Ed Risk analyst [phone] Office [phone]

## 2020-10-10 DEATH — deceased

## 2020-10-11 ENCOUNTER — Inpatient Hospital Stay (HOSPITAL_COMMUNITY): Payer: Managed Care, Other (non HMO)

## 2020-10-11 DIAGNOSIS — K559 Vascular disorder of intestine, unspecified: Secondary | ICD-10-CM | POA: Diagnosis not present

## 2020-10-11 DIAGNOSIS — Z515 Encounter for palliative care: Secondary | ICD-10-CM

## 2020-10-11 DIAGNOSIS — D3A8 Other benign neuroendocrine tumors: Secondary | ICD-10-CM | POA: Diagnosis not present

## 2020-10-11 DIAGNOSIS — Z7189 Other specified counseling: Secondary | ICD-10-CM | POA: Diagnosis not present

## 2020-10-11 DIAGNOSIS — J9601 Acute respiratory failure with hypoxia: Secondary | ICD-10-CM | POA: Diagnosis not present

## 2020-10-11 LAB — CBC
HCT: 26.6 % — ABNORMAL LOW (ref 39.0–52.0)
Hemoglobin: 8.2 g/dL — ABNORMAL LOW (ref 13.0–17.0)
MCH: 28.6 pg (ref 26.0–34.0)
MCHC: 30.8 g/dL (ref 30.0–36.0)
MCV: 92.7 fL (ref 80.0–100.0)
Platelets: 221 10*3/uL (ref 150–400)
RBC: 2.87 MIL/uL — ABNORMAL LOW (ref 4.22–5.81)
RDW: 21.2 % — ABNORMAL HIGH (ref 11.5–15.5)
WBC: 10.7 10*3/uL — ABNORMAL HIGH (ref 4.0–10.5)
nRBC: 0.5 % — ABNORMAL HIGH (ref 0.0–0.2)

## 2020-10-11 LAB — COMPREHENSIVE METABOLIC PANEL
ALT: 89 U/L — ABNORMAL HIGH (ref 0–44)
AST: 74 U/L — ABNORMAL HIGH (ref 15–41)
Albumin: 1.5 g/dL — ABNORMAL LOW (ref 3.5–5.0)
Alkaline Phosphatase: 80 U/L (ref 38–126)
Anion gap: 7 (ref 5–15)
BUN: 83 mg/dL — ABNORMAL HIGH (ref 6–20)
CO2: 18 mmol/L — ABNORMAL LOW (ref 22–32)
Calcium: 8.6 mg/dL — ABNORMAL LOW (ref 8.9–10.3)
Chloride: 125 mmol/L — ABNORMAL HIGH (ref 98–111)
Creatinine, Ser: 2.41 mg/dL — ABNORMAL HIGH (ref 0.61–1.24)
GFR, Estimated: 32 mL/min — ABNORMAL LOW (ref >60)
Glucose, Bld: 191 mg/dL — ABNORMAL HIGH (ref 70–99)
Potassium: 3.6 mmol/L (ref 3.5–5.1)
Sodium: 150 mmol/L — ABNORMAL HIGH (ref 135–145)
Total Bilirubin: 4.6 mg/dL — ABNORMAL HIGH (ref 0.3–1.2)
Total Protein: 6.7 g/dL (ref 6.5–8.1)

## 2020-10-11 LAB — GLUCOSE, CAPILLARY
Glucose-Capillary: 177 mg/dL — ABNORMAL HIGH (ref 70–99)
Glucose-Capillary: 185 mg/dL — ABNORMAL HIGH (ref 70–99)
Glucose-Capillary: 185 mg/dL — ABNORMAL HIGH (ref 70–99)
Glucose-Capillary: 198 mg/dL — ABNORMAL HIGH (ref 70–99)
Glucose-Capillary: 199 mg/dL — ABNORMAL HIGH (ref 70–99)
Glucose-Capillary: 209 mg/dL — ABNORMAL HIGH (ref 70–99)

## 2020-10-11 MED ORDER — MIDAZOLAM HCL 2 MG/2ML IJ SOLN
INTRAMUSCULAR | Status: AC
  Filled 2020-10-11: qty 2

## 2020-10-11 MED ORDER — ZINC CHLORIDE 1 MG/ML IV SOLN
INTRAVENOUS | Status: AC
  Filled 2020-10-11: qty 1840.8

## 2020-10-11 MED ORDER — ROCURONIUM BROMIDE 10 MG/ML (PF) SYRINGE
PREFILLED_SYRINGE | INTRAVENOUS | Status: AC
  Filled 2020-10-11: qty 10

## 2020-10-11 MED ORDER — FENTANYL CITRATE (PF) 100 MCG/2ML IJ SOLN
INTRAMUSCULAR | Status: AC
  Filled 2020-10-11: qty 2

## 2020-10-11 MED ORDER — INSULIN ASPART 100 UNIT/ML ~~LOC~~ SOLN
3.0000 [IU] | SUBCUTANEOUS | Status: DC
  Administered 2020-10-11 – 2020-10-12 (×6): 3 [IU] via SUBCUTANEOUS

## 2020-10-11 NOTE — Progress Notes (Signed)
Pt SpO2 at 30% when RN walked in the room. No changes in pt response or hemodynamics. Oxygen needs increased with pt requiring NRB and BVM. Trauma MD [facility] notified of the situation and Elink consulted. Pt was stabilized after NT suctioning 2x with SpO2 100%. New orders received. RN to continue to monitor.

## 2020-10-11 NOTE — Progress Notes (Signed)
CCM event Note Cross coverage for trauma services  Pt became acutely hypoxic with O2 sats 30%.  The nurse states that he has been very rhonchus through the shift but has been able to clear his airway.  The pt was minimally responsive.  The nursing staff contacted elink and had started to bag the pt with the bvm.  His O2 sats quickly improved as I arrived.  Pt does not appear to be in distress.  +bilateral rhonchi in all lung fields.  CXR did not show significant changes. NT suctioning produced a large amount of mucus.  This was repeated x2.  Pt does not need to be intubated at this time. Would continue with NT suctioning prn.  Will sign off for now

## 2020-10-11 NOTE — Progress Notes (Signed)
Trauma/Critical Care Follow Up Note  Subjective:    Overnight Issues: No acute events  Objective:  Vital signs for last 24 hours: Temp:  [99.3 F (37.4 C)-100.1 F (37.8 C)] 99.5 F (37.5 C) (04/02 1600) Pulse Rate:  [81-103] 94 (04/02 1700) Resp:  [19-29] 19 (04/02 1700) BP: (130-151)/(75-89) 130/83 (04/02 1700) SpO2:  [57 %-100 %] 97 % (04/02 1700)  Hemodynamic parameters for last 24 hours:    Intake/Output from previous day: 04/01 0701 - 04/02 0700 In: 4909.6 [I.V.:4884.6] Out: 5465 [Urine:3875; Drains:1590]  Intake/Output this shift: Total I/O In: 2038.1 [I.V.:2038.1] Out: 2505 [Urine:1925; Drains:580]  Vent settings for last 24 hours:    Physical Exam:  Gen: comfortable, no distress Neuro: non-focal exam, conversant, but weak, oriented to self and place HEENT: PERRL Neck: supple CV: RRR Pulm: unlabored breathing Abd: soft, NT, drains in place and functional GU: clear yellow urine Extr: wwp   Results for orders placed or performed during the hospital encounter of 10/02/2020 (from the past 24 hour(s))  Glucose, capillary     Status: Abnormal   Collection Time: 10/10/20  7:35 PM  Result Value Ref Range   Glucose-Capillary 157 (H) 70 - 99 mg/dL  Glucose, capillary     Status: Abnormal   Collection Time: 10/10/20 11:25 PM  Result Value Ref Range   Glucose-Capillary 195 (H) 70 - 99 mg/dL  Glucose, capillary     Status: Abnormal   Collection Time: 10/11/20  3:41 AM  Result Value Ref Range   Glucose-Capillary 185 (H) 70 - 99 mg/dL  Comprehensive metabolic panel     Status: Abnormal   Collection Time: 10/11/20  4:59 AM  Result Value Ref Range   Sodium 150 (H) 135 - 145 mmol/L   Potassium 3.6 3.5 - 5.1 mmol/L   Chloride 125 (H) 98 - 111 mmol/L   CO2 18 (L) 22 - 32 mmol/L   Glucose, Bld 191 (H) 70 - 99 mg/dL   BUN 83 (H) 6 - 20 mg/dL   Creatinine, Ser 2.41 (H) 0.61 - 1.24 mg/dL   Calcium 8.6 (L) 8.9 - 10.3 mg/dL   Total Protein 6.7 6.5 - 8.1 g/dL    Albumin 1.5 (L) 3.5 - 5.0 g/dL   AST 74 (H) 15 - 41 U/L   ALT 89 (H) 0 - 44 U/L   Alkaline Phosphatase 80 38 - 126 U/L   Total Bilirubin 4.6 (H) 0.3 - 1.2 mg/dL   GFR, Estimated 32 (L) >60 mL/min   Anion gap 7 5 - 15  CBC     Status: Abnormal   Collection Time: 10/11/20  4:59 AM  Result Value Ref Range   WBC 10.7 (H) 4.0 - 10.5 K/uL   RBC 2.87 (L) 4.22 - 5.81 MIL/uL   Hemoglobin 8.2 (L) 13.0 - 17.0 g/dL   HCT 26.6 (L) 39.0 - 52.0 %   MCV 92.7 80.0 - 100.0 fL   MCH 28.6 26.0 - 34.0 pg   MCHC 30.8 30.0 - 36.0 g/dL   RDW 21.2 (H) 11.5 - 15.5 %   Platelets 221 150 - 400 K/uL   nRBC 0.5 (H) 0.0 - 0.2 %  Glucose, capillary     Status: Abnormal   Collection Time: 10/11/20  8:43 AM  Result Value Ref Range   Glucose-Capillary 209 (H) 70 - 99 mg/dL  Glucose, capillary     Status: Abnormal   Collection Time: 10/11/20 12:15 PM  Result Value Ref Range   Glucose-Capillary 185 (H)  70 - 99 mg/dL  Glucose, capillary     Status: Abnormal   Collection Time: 10/11/20  3:58 PM  Result Value Ref Range   Glucose-Capillary 199 (H) 70 - 99 mg/dL    Assessment & Plan: The plan of care was discussed with the bedside nurse for the day, who is in agreement with this plan and no additional concerns were raised.   Present on Admission: . Neuroendocrine neoplasm of gastrointestinal tract . Calculus of gallbladder with acute cholecystitis . Pancreas cancer ([place]) . Neuroendocrine tumor    LOS: 19 days   Additional comments:I reviewed the patient's new clinical lab test results.   and I reviewed the patients new imaging test results.    [name] with a neuroendocrine tumor of the head of the pancreas  Pancreatic NET - s/p right hemicolectomy, end ileostomy, Whipple and patch angioplasty repair of SMV on 10/01/2020. Complicated by postoperative hemorrhagic shock with DIC with extensive small bowel necrosis. All of small bowel has been resected, with external drainage of the bile duct, pancreas and  stomach. - significant drainage, continue to monitor VDRF- extubated 3/30 Shock- resolved AKI - stable FEN - TPN, FW added to TPN, remains hypernatremic but some improvement Protein calorie malnutrition - TPN DVT - SCDs, SQH ID- s/p 10d zosyn and 7d eraxis Foley - removed Goals of care- family desires maximal medical therapy in the hopes of a small bowel transplant. Palliative care already engaged.  [place], MD  Trauma & General Surgery Please use [email or web address] to contact on call provider  10/11/2020  *Care during the described time interval was provided by me. I have reviewed this patient's available data, including medical history, events of note, physical examination and test results as part of my evaluation.

## 2020-10-11 NOTE — Progress Notes (Signed)
PHARMACY - TOTAL PARENTERAL NUTRITION CONSULT NOTE  Indication:  Expected prolonged ileus  Patient Measurements: Height: 6\' 2"  (188 cm) Weight: 124.9 kg (275 lb 5.7 oz) IBW/kg (Calculated) : 82.2 TPN AdjBW (KG): 95.3 Body mass index is 35.35 kg/m.  Assessment:  41 YOM with history of mass adjacent to head of the pancreas in 2021.  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  Developed cholecystitis in Feb 2022 and had percutaneous cholecystectomy tube placement.  Presented this admission for neuroendocrine tumor resection with Whipple procedure, also had right total colectomy, colostomy and cholecystectomy on 09/30/2020. SMV was lacerated during procedure and repaired by bovine pericardial patch. Developed hemorrhagic shock with bile leak post-op and underwent placement of biliary T tube with abdominal washout on 3/15 PM. Abdomen remains open. Pharmacy consulted to manage TPN.  Glucose / Insulin: no hx DM, A1c 5.2%. CBGs previously controlled <180, now slightly elevated 150-200 on D5W @ 75.  Utilized 41 units novolog in the past 24h hours. 50 units in TPN - will adjust to 3 units q4h (hold for CBG<150) and adjust prn to help manage CBGs with D5W Electrolytes: Na 150 (no Na in TPN, D5W, FW deficit ~ 9L), Cl 125 << 130, HCO3 low (on max acetate) CoCa high at 10.6 (none in TPN), K 3.6 Renal: AKI - SCr stable at 2.41 << 2.42 (BL ~1-1.2), BUN down 83 << 87 Hepatic: LFTs mildly elevated, tbili 4.6 (scleral yellowing) Albumin 1.5, prealbumin 25.1 (3/28) TG peaked at 740, now back down to 259 on 4/1 (received SMOFlipids on 3/23 and 3/24) - will attempt to supplement EFA 100g/wk and monitor TG trends - will plan for Mon/Thurs supplementation. Next TG check on 4/4 AM. Intake / Output; MIVF: UOP 1.3 ml/kg/hr, D5W at 75 ml/hr, drain (4 total): 1625 cc/24h, 80 cc from gastromy, net -3.9L GI Imaging: none since TPN start GI Surgeries / Procedures:  3/14 Whipple procedure with R  hemicolectomy and end ileostomy, cholecystectomy 3/15 washout, VAC placement 3/17 washout, VAC replacement.  Necrosis of entire small bowel except for the PB limb and proximal 40-50cm of jejunum 3/19 re-opening laparotomy, abd closure.  Necrotic SB. 3/21 SB resection, take down of choledochojejunal anastomosis and pancreaticojejunal anastomosis, take down of duodenojejunal anastomosis, placement of externalized biliary drain / Stamm gastrostomy tube, IA washout and placement of intraperitoneal drains 3/30 extubation  Central access: R IJ CVC placed 3/15; changed to PICC placed 10/03/20 TPN start date: 10/06/2020  Nutritional Goals (RD rec on 3/30): 2800-3000 kCal, 185-210g AA, fluid >2L/day  Current Nutrition:  NPO - failed SLP eval 4/1 TPN  Plan:  - Continue TPN at 130 ml/hr - Planning intralipid 20% 250 cc/12h (~50g lipids) supplements 2x/wk for now to prevent EFAD - last 4/1, planning Mon/Thurs moving forward while monitoring TG trends - TPN with intralipid 2x/wk provides a daily average of 85g AA and 2983 kCal, meeting 100% of patient needs - Electrolytes in TPN: incr K to 35 mEq/L, remove Na/Ca, add Mg 2 mEq/L, Phos 12 mmol/L, max acetate - Daily multivitamin in TPN.  Remove standard trace elements and add back zinc 5mg , chromium 56mcg, selenium 46mcg  - Continue resistant SSI Q4H, incr to 3 units novolog q4h (hold for CBG<150), and continue with 50 units regular insulin in TPN  - D5W at 75 ml/hr per Trauma for hypernatremia  - Standard TPN labs, TG on Mon/Thur, BMET + Mg/Phos in the AM  F/u surgery note  Thank you for allowing pharmacy  to be a part of this patient's care.  Alycia Rossetti, PharmD, BCPS Clinical Pharmacist Clinical phone for 10/11/2020: [phone] 10/11/2020 7:29 AM   **Pharmacist phone directory can now be found on [email or web address] ([email or web address]).  Listed under [place].

## 2020-10-11 NOTE — Progress Notes (Signed)
eLink Physician-Brief Progress Note Patient Name: [name] DOB: 13-Mar-1971 MRN: [id number]   Date of Service  10/11/2020  HPI/Events of Note  Notified of acute desaturation  Ambubagging ongoing with improvement in saturation Weak looking but responsive Now sats 100% on 100% NRB mask  Rhonchi on bedside exam with upper airway noise. Report of several episodes of vomiting yesterday. Still on TPN  eICU Interventions  NT suction Strict aspiration precaution  CCM team at beside assessing need for intubation     Intervention Category Major Interventions: Respiratory failure - evaluation and management  Shona Needles Anadia Helmes 10/11/2020, 4:08 AM

## 2020-10-11 NOTE — Progress Notes (Addendum)
Inpatient Rehab Admissions Coordinator:  Attempted to contact wife, Inam.  Left a message.  Awaiting return call.  Also attempted to contact brother, Abass today.  Tried to contact him x2.  No answer and not able to leave a message.  Will continue to follow.    Addendum:  Abass returned call.  Explained CIR goals and expectations.  He did not indicate whether or not he was interested in pt pursuing CIR.   He mentioned pt going to [facility] for a transplant. He was unsure of date.  Stated he would call [facility] back when he had more information regarding pt's hospital stay and transplant.  Will continue to follow.   Addendum:  Another family member called.  She stated she is Inam's sister.  She was asking for clarification regarding CIR goals and expectations. She also asked for medical updates.  AC explained CIR to her.  AC requested she call the pt's nurse for medical updates.  Will continue to follow.   Gayland Curry, [place], [place] Admissions Coordinator [phone]

## 2020-10-11 NOTE — Progress Notes (Signed)
Daily Progress Note   Patient Name: [name]       Date: 10/11/2020 DOB: [birth year]  Age: 50 y.o. MRN#: [id number] Attending Physician: [name], MD Primary Care Physician: Gildardo Pounds, NP Admit Date: 09/16/2020  Reason for Consultation/Follow-up: Establishing goals of care  Subjective: Events of last night noted. Patient today is awake, weak. Nods "no" to pain. Speech is low and unintelligible. No family at bedside.   Review of Systems  Unable to perform ROS: Acuity of condition    Length of Stay: 19  Current Medications: Scheduled Meds:  . sodium chloride   Intravenous Once  . chlorhexidine  15 mL Mouth Rinse BID  . Chlorhexidine Gluconate Cloth  6 each Topical Daily  . heparin injection (subcutaneous)  5,000 Units Subcutaneous Q8H  . insulin aspart  0-20 Units Subcutaneous Q4H  . insulin aspart  3 Units Subcutaneous Q4H  . mouth rinse  15 mL Mouth Rinse q12n4p  . pantoprazole (PROTONIX) IV  40 mg Intravenous BID  . sodium chloride flush  10-40 mL Intracatheter Q12H    Continuous Infusions: . sodium chloride 250 mL (10/07/20 0916)  . sodium chloride 250 mL (10/03/20 0904)  . dextrose 75 mL/hr at 10/11/20 1000  . lactated ringers Stopped (10/09/2020 0706)  . promethazine (PHENERGAN) injection    . TPN ADULT (ION) 130 mL/hr at 10/11/20 1000  . TPN ADULT (ION)      PRN Meds: HYDROmorphone (DILAUDID) injection, ondansetron (ZOFRAN) IV, polyvinyl alcohol, promethazine (PHENERGAN) injection, sodium chloride flush  Physical Exam Vitals and nursing note reviewed.  Constitutional:      Appearance: He is diaphoretic.             Vital Signs: BP 133/79   Pulse 81   Temp 99.8 F (37.7 C) (Rectal)   Resp (!) 21   Ht 6\' 2"  (1.88 m)   Wt 124.9 kg   SpO2  98%   BMI 35.35 kg/m  SpO2: SpO2: 98 % O2 Device: O2 Device: Nasal Cannula O2 Flow Rate: O2 Flow Rate (L/min): 2 L/min  Intake/output summary:   Intake/Output Summary (Last 24 hours) at 10/11/2020 1203 Last data filed at 10/11/2020 1000 Gross per 24 hour  Intake 4992.67 ml  Output 6975 ml  Net -1982.33 ml   LBM: Last BM Date:  (  pta) Baseline Weight: Weight: 95.3 kg Most recent weight: Weight: 124.9 kg       Palliative Assessment/Data: PPS: 10%    Flowsheet Rows   Flowsheet Row Most Recent Value  Intake Tab   Referral Department Surgery  Unit at Time of Referral ICU  Date Notified 09/28/20  Palliative Care Type New Palliative care  Reason for referral Clarify Goals of Care, End of Life Care Assistance  Date of Admission 09/09/2020  Date first seen by Palliative Care 09/28/20  # of days Palliative referral response time 0 Day(s)  # of days IP prior to Palliative referral 6  Clinical Assessment   Psychosocial & Spiritual Assessment   Palliative Care Outcomes       Patient Active Problem List   Diagnosis Date Noted  . Endotracheally intubated   . Acute respiratory failure with hypoxia ([place])   . Small bowel ischemia ([place])   . Shock circulatory ([place])   . Neuroendocrine neoplasm of gastrointestinal tract 09/14/2020  . Pancreas cancer ([place]) 09/11/2020  . Neuroendocrine tumor 09/21/2020  . Calculus of gallbladder with acute cholecystitis 08/26/2020  . Acute cholecystitis 08/26/2020  . Symptomatic cholelithiasis 08/21/2020  . Gout 07/03/2020  . Cholelithiasis 05/27/2020  . Abdominal mass 05/25/2020  . Dermatitis 02/10/2017  . Nephrolithiasis 02/10/2017  . HTN (hypertension) 09/22/2015  . Constipation 09/22/2015  . Asthma, intermittent 02/04/2014  . Ventricular fibrillation ([place]) 11/09/2013  . Environmental allergies 10/11/2013  . IFG (impaired fasting glucose) 10/11/2013  . Hyperlipidemia 10/11/2013  . Chronic systolic heart failure ([place]) 05/26/2010  . Automatic  implantable cardioverter-defibrillator in situ 05/26/2010    Palliative Care Assessment & Plan   Patient Profile: 50 y.o. M with PMH of neuroendocrine tumor and underwent Whipple procedure 3/14 with intra-operative SMV laceration and repair by vascular with bovine pericardial patch.  Pt had worsening shock overnight and required three pressors despite massive transfusion protocol (received 150 blood products).  He was taken back to the OR for washout on 3/15 and returned to the ICU intubated.  Taken back 3/17 again found to have small bowel necrosis s/p resection.  Plans again to return 3/19 to OR. Palliative medicine consulted for goals of care.    Assessment/Recommendations/Plan  Continue current scope of care Noted CIR has been recommended Thus far family has endorsed all aggressive care- PMT will follow peripherally- please call if further Palliative assistance is needed  Goals of Care and Additional Recommendations: Limitations on Scope of Treatment: Full Scope Treatment  Code Status: Limited code  Prognosis:  Unable to determine  Discharge Planning: To Be Determined   Thank you for allowing the Palliative Medicine Team to assist in the care of this patient.   Total time: 28 mins Greater than 50%  of this time was spent counseling and coordinating care related to the above assessment and plan.  Mariana Kaufman, AGNP-C Palliative Medicine   Please contact Palliative Medicine Team phone at [phone] for questions and concerns.

## 2020-10-12 LAB — COMPREHENSIVE METABOLIC PANEL
ALT: 80 U/L — ABNORMAL HIGH (ref 0–44)
AST: 75 U/L — ABNORMAL HIGH (ref 15–41)
Albumin: 1.3 g/dL — ABNORMAL LOW (ref 3.5–5.0)
Alkaline Phosphatase: 73 U/L (ref 38–126)
Anion gap: 5 (ref 5–15)
BUN: 79 mg/dL — ABNORMAL HIGH (ref 6–20)
CO2: 18 mmol/L — ABNORMAL LOW (ref 22–32)
Calcium: 8.6 mg/dL — ABNORMAL LOW (ref 8.9–10.3)
Chloride: 126 mmol/L — ABNORMAL HIGH (ref 98–111)
Creatinine, Ser: 2.23 mg/dL — ABNORMAL HIGH (ref 0.61–1.24)
GFR, Estimated: 35 mL/min — ABNORMAL LOW (ref >60)
Glucose, Bld: 170 mg/dL — ABNORMAL HIGH (ref 70–99)
Potassium: 3.8 mmol/L (ref 3.5–5.1)
Sodium: 149 mmol/L — ABNORMAL HIGH (ref 135–145)
Total Bilirubin: 3.9 mg/dL — ABNORMAL HIGH (ref 0.3–1.2)
Total Protein: 6.3 g/dL — ABNORMAL LOW (ref 6.5–8.1)

## 2020-10-12 LAB — GLUCOSE, CAPILLARY
Glucose-Capillary: 171 mg/dL — ABNORMAL HIGH (ref 70–99)
Glucose-Capillary: 167 mg/dL — ABNORMAL HIGH (ref 70–99)
Glucose-Capillary: 167 mg/dL — ABNORMAL HIGH (ref 70–99)
Glucose-Capillary: 159 mg/dL — ABNORMAL HIGH (ref 70–99)
Glucose-Capillary: 184 mg/dL — ABNORMAL HIGH (ref 70–99)
Glucose-Capillary: 195 mg/dL — ABNORMAL HIGH (ref 70–99)

## 2020-10-12 LAB — PHOSPHORUS: Phosphorus: 3.4 mg/dL (ref 2.5–4.6)

## 2020-10-12 LAB — MAGNESIUM: Magnesium: 1.9 mg/dL (ref 1.7–2.4)

## 2020-10-12 MED ORDER — ZINC CHLORIDE 1 MG/ML IV SOLN
INTRAVENOUS | Status: AC
  Filled 2020-10-12: qty 1840.8

## 2020-10-12 MED ORDER — INSULIN ASPART 100 UNIT/ML ~~LOC~~ SOLN
2.0000 [IU] | SUBCUTANEOUS | Status: DC
  Administered 2020-10-12 – 2020-10-14 (×12): 2 [IU] via SUBCUTANEOUS

## 2020-10-12 MED ORDER — MAGNESIUM SULFATE IN D5W 1-5 GM/100ML-% IV SOLN
1.0000 g | Freq: Once | INTRAVENOUS | Status: AC
  Administered 2020-10-12: 1 g via INTRAVENOUS
  Filled 2020-10-12: qty 100

## 2020-10-12 NOTE — Progress Notes (Signed)
Follow up - Trauma and Critical Care  Patient Details:    [name] is an 50 y.o. male.  Anti-infectives:  Anti-infectives (From admission, onward)   Start     Dose/Rate Route Frequency Ordered Stop   10/01/20 1100  anidulafungin (ERAXIS) 100 mg in sodium chloride 0.9 % 100 mL IVPB  Status:  Discontinued       "Followed by" Linked Group Details   100 mg 78 mL/hr over 100 Minutes Intravenous Every 24 hours 09/30/20 1014 10/08/20 0951   09/30/20 1100  anidulafungin (ERAXIS) 200 mg in sodium chloride 0.9 % 200 mL IVPB       "Followed by" Linked Group Details   200 mg 78 mL/hr over 200 Minutes Intravenous  Once 09/30/20 1014 09/30/20 1516   10/05/2020 1600  vancomycin (VANCOREADY) IVPB 1000 mg/200 mL  Status:  Discontinued        1,000 mg 200 mL/hr over 60 Minutes Intravenous Every 24 hours 09/24/2020 1137 09/26/2020 1510   09/16/2020 1400  piperacillin-tazobactam (ZOSYN) IVPB 3.375 g  Status:  Discontinued        3.375 g 12.5 mL/hr over 240 Minutes Intravenous Every 8 hours 09/22/2020 1322 10/08/20 0952   09/16/2020 1430  vancomycin (VANCOREADY) IVPB 1750 mg/350 mL  Status:  Discontinued        1,750 mg 175 mL/hr over 120 Minutes Intravenous Every 24 hours 09/20/2020 1349 09/17/2020 1137   09/26/20 1130  vancomycin (VANCOREADY) IVPB 2000 mg/400 mL        2,000 mg 200 mL/hr over 120 Minutes Intravenous  Once 09/26/20 1031 09/26/20 1411   09/26/20 1030  vancomycin variable dose per unstable renal function (pharmacist dosing)  Status:  Discontinued         Does not apply See admin instructions 09/26/20 1031 09/28/20 0816   09/17/2020 2200  metroNIDAZOLE (FLAGYL) IVPB 500 mg  Status:  Discontinued        500 mg 100 mL/hr over 60 Minutes Intravenous Every 8 hours 09/10/2020 1820 09/21/2020 1322   10/02/2020 2000  cefTRIAXone (ROCEPHIN) 2 g in sodium chloride 0.9 % 100 mL IVPB  Status:  Discontinued        2 g 200 mL/hr over 30 Minutes Intravenous Every 24 hours 09/18/2020 1820 09/12/2020 1322   10/01/2020 1330   vancomycin (VANCOCIN) 2,250 mg in sodium chloride 0.9 % 500 mL IVPB  Status:  Discontinued        2,250 mg 250 mL/hr over 120 Minutes Intravenous Every 48 hours 09/12/2020 1228 10/01/2020 1320   10/05/2020 1300  cefTRIAXone (ROCEPHIN) 2 g in sodium chloride 0.9 % 100 mL IVPB  Status:  Discontinued        2 g 200 mL/hr over 30 Minutes Intravenous Every 24 hours 09/30/2020 1208 09/17/2020 1320   09/24/2020 1300  metroNIDAZOLE (FLAGYL) IVPB 500 mg  Status:  Discontinued        500 mg 100 mL/hr over 60 Minutes Intravenous Every 8 hours 09/11/2020 1208 09/17/2020 1320   09/11/2020 0915  metroNIDAZOLE (FLAGYL) IVPB 500 mg  Status:  Discontinued        500 mg 100 mL/hr over 60 Minutes Intravenous To Surgery 10/01/2020 0903 09/30/2020 0951   10/03/2020 0630  ceFAZolin (ANCEF) IVPB 2g/100 mL premix       "And" Linked Group Details   2 g 200 mL/hr over 30 Minutes Intravenous On call to O.R. 10/03/2020 6387 10/07/2020 1215   10/05/2020 0630  metroNIDAZOLE (FLAGYL) IVPB 500 mg       "  And" Linked Group Details   500 mg 100 mL/hr over 60 Minutes Intravenous On call to O.R. 09/21/2020 0627 09/12/2020 0820      Consults:    Chief Complaint/Subjective:    Overnight Issues: No acute events  Objective:  Vital signs for last 24 hours: Temp:  [99.4 F (37.4 C)-100.4 F (38 C)] 100.4 F (38 C) (04/03 0800) Pulse Rate:  [81-97] 91 (04/03 0800) Resp:  [19-29] 26 (04/03 0900) BP: (122-146)/(71-88) 132/81 (04/03 0900) SpO2:  [97 %-100 %] 100 % (04/03 0800)  Hemodynamic parameters for last 24 hours:    Intake/Output from previous day: 04/02 0701 - 04/03 0700 In: 4839.2 [I.V.:4839.2] Out: 5735 [Urine:4075; Drains:1660]  Intake/Output this shift: Total I/O In: 410.1 [I.V.:410.1] Out: -   Vent settings for last 24 hours:    Physical Exam:  Gen: somnolent but arousable HEENT: normocephalic, atraumatic Resp: slightly rhonchus, responding to NT suctionin Cardiovascular: RRR Abdomen: distended, drains with bilious and  darker thin output Ext: trace edema Neuro: arousable, moves all extremities, mouths simple answers   Assessment/Plan:   Pancreatic NET - s/p right hemicolectomy, end ileostomy, Whipple and patch angioplasty repair of SMV on 09/10/2020. Complicated by postoperative hemorrhagic shock with DIC with extensive small bowel necrosis. All of small bowel has been resected, with external drainage of the bile duct, pancreas and stomach. - significant drainage, continue to monitor VDRF- extubated 3/30 Shock- resolved AKI- stable Hypernatremia - free water added with some improvement, decrease free water in half today FEN - TPN Protein calorie malnutrition - TPN DVT - SCDs, SQH ID- s/p 10d zosyn and 7d eraxis Foley - removed Goals of care- family desires maximal medical therapy in the hopes of a small bowel transplant. Palliative care already engaged.  Dispo - ICU   LOS: 20 days   [name] 10/12/2020  *Care during the described time interval was provided by me and/or other providers on the critical care team.  I have reviewed this patient's available data, including medical history, events of note, physical examination and test results as part of my evaluation.

## 2020-10-12 NOTE — Progress Notes (Signed)
PHARMACY - TOTAL PARENTERAL NUTRITION CONSULT NOTE  Indication:  Expected prolonged ileus  Patient Measurements: Height: 6\' 2"  (188 cm) Weight: 124.9 kg (275 lb 5.7 oz) IBW/kg (Calculated) : 82.2 TPN AdjBW (KG): 95.3 Body mass index is 35.35 kg/m.  Assessment:  24 YOM with history of mass adjacent to head of the pancreas in 2021.  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  Developed cholecystitis in Feb 2022 and had percutaneous cholecystectomy tube placement.  Presented this admission for neuroendocrine tumor resection with Whipple procedure, also had right total colectomy, colostomy and cholecystectomy on 10/07/2020. SMV was lacerated during procedure and repaired by bovine pericardial patch. Developed hemorrhagic shock with bile leak post-op and underwent placement of biliary T tube with abdominal washout on 3/15 PM. Abdomen remains open. Pharmacy consulted to manage TPN.  Glucose / Insulin: no hx DM, A1c 5.2%. CBGs 180-200 on D5W @ 75 + TPN.  Utilized 42 units novolog in the past 24h hours. 50 units in TPN - D5W reduced to 35 cc/hr this AM - will reduce scheduled novolog to 2 units q4h (hold for CBG<150) and will adjust prn to help manage CBGs with D5W. Once off - will work with insulin adjustments in TPN bag  Electrolytes: Na 149 (no Na in TPN, D5W, FW deficit ~ 9L), Cl 126, HCO3 low (on max acetate) CoCa high at 10.8 (none in TPN), K 3.8 Renal: AKI - SCr down 2.23 << 2.41 (BL ~1-1.2), BUN down 79 Hepatic: LFTs mildly elevated, Tbili 3.9 (scleral yellowing still noted per RN 4/3) Albumin 1.3, prealbumin 25.1 (3/28) TG peaked at 740, now back down to 259 on 4/1 (received SMOFlipids on 3/23 and 3/24, intralipid on 4/1) - will attempt to supplement EFA 100g/wk and monitor TG trends - will plan for Mon/Thurs supplementation. Next TG check on 4/4 AM. Intake / Output; MIVF: UOP 1.4 ml/kg/hr, D5W at 75 ml/hr, drain (4 total): 1610 cc/24h, 100 cc from gastromy, net  -10.2L GI Imaging: none since TPN start GI Surgeries / Procedures:  3/14 Whipple procedure with R hemicolectomy and end ileostomy, cholecystectomy 3/15 washout, VAC placement 3/17 washout, VAC replacement.  Necrosis of entire small bowel except for the PB limb and proximal 40-50cm of jejunum 3/19 re-opening laparotomy, abd closure.  Necrotic SB. 3/21 SB resection, take down of choledochojejunal anastomosis and pancreaticojejunal anastomosis, take down of duodenojejunal anastomosis, placement of externalized biliary drain / Stamm gastrostomy tube, IA washout and placement of intraperitoneal drains 3/30 extubation  Central access: R IJ CVC placed 3/15; changed to PICC placed 10/03/20 TPN start date: 09/27/2020  Nutritional Goals (RD rec on 3/30): 2800-3000 kCal, 185-210g AA, fluid >2L/day  Current Nutrition:  NPO - failed SLP eval 4/1 TPN  Plan:  - Continue TPN at 130 ml/hr - Planning intralipid 20% 250 cc/12h (~50g lipids) supplements 2x/wk for now to prevent EFAD - last 4/1, planning Mon/Thurs moving forward while monitoring TG trends - TPN with intralipid 2x/wk provides a daily average of 85g AA and 2983 kCal, meeting 100% of patient needs - Electrolytes in TPN: cont K at 35 mEq/L, remove Na/Ca, incr Mg 3 mEq/L, Phos 12 mmol/L, max acetate - Magnesium 1g IV x 1 dose today - Daily multivitamin in TPN.  Remove standard trace elements and add back zinc 5mg , chromium 18mcg, selenium 30mcg  - Continue resistant SSI Q4H, reduce to 2 units novolog q4h (hold for CBG<150) with drop in D5W rate, and continue with 50 units regular insulin in  TPN  - D5W reduced to 35 ml/hr per Trauma for hypernatremia - Standard TPN labs, TG on Mon/Thur  Thank you for allowing pharmacy to be a part of this patient's care.  Alycia Rossetti, PharmD, BCPS Clinical Pharmacist Clinical phone for 10/12/2020: [phone] 10/12/2020 7:41 AM   **Pharmacist phone directory can now be found on [email or web address] ([email or web address]).  Listed under  [place].

## 2020-10-13 LAB — MAGNESIUM: Magnesium: 2.3 mg/dL (ref 1.7–2.4)

## 2020-10-13 LAB — DIFFERENTIAL
Abs Immature Granulocytes: 0.25 10*3/uL — ABNORMAL HIGH (ref 0.00–0.07)
Basophils Absolute: 0 10*3/uL (ref 0.0–0.1)
Basophils Relative: 0 %
Eosinophils Absolute: 0.2 10*3/uL (ref 0.0–0.5)
Eosinophils Relative: 2 %
Immature Granulocytes: 2 %
Lymphocytes Relative: 8 %
Lymphs Abs: 0.9 10*3/uL (ref 0.7–4.0)
Monocytes Absolute: 0.9 10*3/uL (ref 0.1–1.0)
Monocytes Relative: 8 %
Neutro Abs: 8.9 10*3/uL — ABNORMAL HIGH (ref 1.7–7.7)
Neutrophils Relative %: 80 %

## 2020-10-13 LAB — COMPREHENSIVE METABOLIC PANEL
ALT: 81 U/L — ABNORMAL HIGH (ref 0–44)
AST: 78 U/L — ABNORMAL HIGH (ref 15–41)
Albumin: 1.4 g/dL — ABNORMAL LOW (ref 3.5–5.0)
Alkaline Phosphatase: 82 U/L (ref 38–126)
Anion gap: 8 (ref 5–15)
BUN: 79 mg/dL — ABNORMAL HIGH (ref 6–20)
CO2: 17 mmol/L — ABNORMAL LOW (ref 22–32)
Calcium: 8.7 mg/dL — ABNORMAL LOW (ref 8.9–10.3)
Chloride: 123 mmol/L — ABNORMAL HIGH (ref 98–111)
Creatinine, Ser: 2.22 mg/dL — ABNORMAL HIGH (ref 0.61–1.24)
GFR, Estimated: 35 mL/min — ABNORMAL LOW (ref >60)
Glucose, Bld: 184 mg/dL — ABNORMAL HIGH (ref 70–99)
Potassium: 4 mmol/L (ref 3.5–5.1)
Sodium: 148 mmol/L — ABNORMAL HIGH (ref 135–145)
Total Bilirubin: 4 mg/dL — ABNORMAL HIGH (ref 0.3–1.2)
Total Protein: 7.1 g/dL (ref 6.5–8.1)

## 2020-10-13 LAB — CBC
HCT: 25.7 % — ABNORMAL LOW (ref 39.0–52.0)
Hemoglobin: 7.7 g/dL — ABNORMAL LOW (ref 13.0–17.0)
MCH: 27.9 pg (ref 26.0–34.0)
MCHC: 30 g/dL (ref 30.0–36.0)
MCV: 93.1 fL (ref 80.0–100.0)
Platelets: 237 10*3/uL (ref 150–400)
RBC: 2.76 MIL/uL — ABNORMAL LOW (ref 4.22–5.81)
RDW: 21.2 % — ABNORMAL HIGH (ref 11.5–15.5)
WBC: 11.1 10*3/uL — ABNORMAL HIGH (ref 4.0–10.5)
nRBC: 0.5 % — ABNORMAL HIGH (ref 0.0–0.2)

## 2020-10-13 LAB — PREALBUMIN: Prealbumin: 13.3 mg/dL — ABNORMAL LOW (ref 18–38)

## 2020-10-13 LAB — GLUCOSE, CAPILLARY
Glucose-Capillary: 161 mg/dL — ABNORMAL HIGH (ref 70–99)
Glucose-Capillary: 171 mg/dL — ABNORMAL HIGH (ref 70–99)
Glucose-Capillary: 180 mg/dL — ABNORMAL HIGH (ref 70–99)
Glucose-Capillary: 180 mg/dL — ABNORMAL HIGH (ref 70–99)
Glucose-Capillary: 182 mg/dL — ABNORMAL HIGH (ref 70–99)
Glucose-Capillary: 193 mg/dL — ABNORMAL HIGH (ref 70–99)

## 2020-10-13 LAB — TRIGLYCERIDES: Triglycerides: 299 mg/dL — ABNORMAL HIGH (ref <150)

## 2020-10-13 LAB — PHOSPHORUS: Phosphorus: 3.8 mg/dL (ref 2.5–4.6)

## 2020-10-13 MED ORDER — IPRATROPIUM-ALBUTEROL 0.5-2.5 (3) MG/3ML IN SOLN
3.0000 mL | Freq: Four times a day (QID) | RESPIRATORY_TRACT | Status: DC
  Administered 2020-10-13 – 2020-10-17 (×16): 3 mL via RESPIRATORY_TRACT
  Filled 2020-10-13 (×15): qty 3

## 2020-10-13 MED ORDER — ZINC CHLORIDE 1 MG/ML IV SOLN
INTRAVENOUS | Status: AC
  Filled 2020-10-13: qty 1840.8

## 2020-10-13 MED ORDER — ZINC CHLORIDE 1 MG/ML IV SOLN
INTRAVENOUS | Status: DC
  Filled 2020-10-13: qty 1840.8

## 2020-10-13 MED ORDER — FAT EMULSION PLANT BASED 20% (INTRALIPID)IV EMUL
250.0000 mL | INTRAVENOUS | Status: AC
  Administered 2020-10-13: 250 mL via INTRAVENOUS
  Filled 2020-10-13: qty 250

## 2020-10-13 NOTE — Progress Notes (Signed)
Nasal trumpet placed in pts left nare to facilitate nasotracheal suction. Pt tolerated well.

## 2020-10-13 NOTE — Progress Notes (Signed)
PHARMACY - TOTAL PARENTERAL NUTRITION CONSULT NOTE  Indication:  Expected prolonged ileus  Patient Measurements: Height: 6\' 2"  (188 cm) Weight: 124.9 kg (275 lb 5.7 oz) IBW/kg (Calculated) : 82.2 TPN AdjBW (KG): 95.3 Body mass index is 35.35 kg/m.  Assessment:  12 YOM with history of mass adjacent to head of the pancreas in 2021.  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  Developed cholecystitis in Feb 2022 and had percutaneous cholecystectomy tube placement.  Presented this admission for neuroendocrine tumor resection with Whipple procedure, also had right total colectomy, colostomy and cholecystectomy on 10/02/2020. SMV was lacerated during procedure and repaired by bovine pericardial patch. Developed hemorrhagic shock with bile leak post-op and underwent placement of biliary T tube with abdominal washout on 3/15 PM. Abdomen remains open. Pharmacy consulted to manage TPN.  Glucose / Insulin: no hx DM, A1c 5.2%. CBGs 161-184 on D5W @ 35 + TPN.  Utilized 37 units novolog in the past 24h hours. 50 units in TPN -scheduled novolog to 2 units q4h (hold for CBG<150) and will adjust prn to help manage CBGs with D5W. Once off - will work with insulin adjustments in TPN bag  Electrolytes: Na 149>148 (no Na in TPN, D5W, FW deficit down to ~4L), Cl 123, HCO3 low (on max acetate) CoCa high at 10.4 (none in TPN), K 4, Mg 2.3, Phos 3.8  Renal: AKI - SCr down 2.22 (BL ~1-1.2), BUN stable at 79 Hepatic: LFTs mildly elevated, Tbili 4 (scleral yellowing still noted per RN 4/3) Albumin 1.4, prealbumin 25.1>13.3 (4.4) TG peaked at 740, now back down to 299 on 4/4 (received SMOFlipids on 3/23 and 3/24, intralipid on 4/1) - will attempt to supplement EFA 100g/wk and monitor TG trends - will plan for Mon/Thurs supplementation. Next TG check on 4/7 AM. Intake / Output; MIVF: UOP 1.8 ml/kg/hr, D5W at 35 ml/hr, drain (4 total): 1860 cc/24h, 50 cc from gastromy, net -6.8L GI Imaging: none  since TPN start GI Surgeries / Procedures:  3/14 Whipple procedure with R hemicolectomy and end ileostomy, cholecystectomy 3/15 washout, VAC placement 3/17 washout, VAC replacement.  Necrosis of entire small bowel except for the PB limb and proximal 40-50cm of jejunum 3/19 re-opening laparotomy, abd closure.  Necrotic SB. 3/21 SB resection, take down of choledochojejunal anastomosis and pancreaticojejunal anastomosis, take down of duodenojejunal anastomosis, placement of externalized biliary drain / Stamm gastrostomy tube, IA washout and placement of intraperitoneal drains 3/30 extubation  Central access: R IJ CVC placed 3/15; changed to PICC placed 10/03/20 TPN start date: 10/01/2020  Nutritional Goals (RD rec on 3/30): 2800-3000 kCal, 185-210g AA, fluid >2L/day  Current Nutrition:  NPO - failed SLP eval 4/1 TPN  Plan:  - Continue TPN at 130 ml/hr - Planning intralipid 20% 250 cc/12h (~50g lipids) supplements 2x/wk for now to prevent EFAD - last 4/1, planning Mon/Thurs moving forward while monitoring TG trends - TPN with intralipid 2x/wk provides a daily average of 85g AA and 2983 kCal, meeting 100% of patient needs - Electrolytes in TPN: cont K at 35 mEq/L, remove Na/Ca, Mg 3 mEq/L, Phos 12 mmol/L, max acetate - Daily multivitamin in TPN.  Remove standard trace elements and add back zinc 5mg , chromium 75mcg, selenium 51mcg  - Continue resistant SSI Q4H, 2 units novolog q4h (hold for CBG<150), and continue with 50 units regular insulin in TPN  - Standard TPN labs, TG on Mon/Thur  Thank you for allowing pharmacy to be a part of this patient's  care.  [name], PharmD., BCPS, BCCCP Clinical Pharmacist Please refer to [facility] for unit-specific pharmacist

## 2020-10-13 NOTE — Progress Notes (Signed)
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 s/p right hemicolectomy, end ileostomy, Whipple and patch angioplasty repair of SMV. postoperative hemorrhagic shock with DIC with extensive small bowel necrosis. All of small bowel has been resected, with external drainage of the bile duct, pancreas and stomach. Extubated 3/30. PMH including AICD, heart failure, HTN, and dyslipidemia.    PT Comments    Session focused on therapeutic exercises for ROM/strengthening, truncal and core activation, and pre transfer training. Pt requiring two person maximal assist for repositioning in bed. Bed placed in egress position with feet supported with focus on midline positioning, anterior weight shift and upright posture. Pt able to functionally cough (albeit weakly) with abdominal pillow splinting and OT suctioned several times. Pt only able to achieve minimal hip clearance with attempt to partially stand; no initiation noted. Pt continues with gross weakness, decreased cognition, and decreased endurance. Continue to recommend post acute rehab to address deficits.   Vitals: SpO2 97% on 2L O2, BP supine 138/84, sitting in Egress 140/76, after exercises 144/85   Follow Up Recommendations  CIR     Equipment Recommendations  Wheelchair (measurements PT);Wheelchair cushion (measurements PT);Hospital bed;Other (comment) (hoyer lift)    Recommendations for Other Services Rehab consult     Precautions / Restrictions Precautions Precautions: Fall Precaution Comments: x2 JP drains at abdomen and x2 biliary drains Restrictions Weight Bearing Restrictions: No    Mobility  Bed Mobility               General bed mobility comments: Bed in egress position    Transfers                 General transfer comment:  Attempted to achieve hip clearance with bed in egress position with feet supported. TotalA + 2 to elevate hips minimally from bed  Ambulation/Gait                 Stairs             Wheelchair Mobility    Modified Rankin (Stroke Patients Only)       Balance Overall balance assessment: Needs assistance Sitting-balance support: Feet supported;Bilateral upper extremity supported Sitting balance-[name]: Poor Sitting balance - Comments: Pt with difficulty maintaining sitting balance when listing trunk off bed support (bed in egress). Max A for support Postural control: Posterior lean                                  Cognition Arousal/Alertness: Awake/alert Behavior During Therapy: WFL for tasks assessed/performed Overall Cognitive Status: Difficult to assess                                 General Comments: Soft spoken and difficult to understand. Flucuating between eye open and closed. Follow simple, direct commands inconsistently. Noting pt perseverating at times and prior commands; such as pt performing leg exercises during grooming task (which happened right before grooming task). As session progressed, pt following cues more consistently. When asking pt "where are you" pt answered [place].      Exercises General Exercises - Upper Extremity Shoulder Flexion: AAROM;Both;5 reps;Seated Elbow Flexion: 5 reps;Seated;Strengthening;AAROM;Both Elbow Extension: AAROM;5 reps;Seated;Both General Exercises - Lower Extremity  Ankle Circles/Pumps: Strengthening;Both;5 reps;AROM;Seated Short Arc Quad: AAROM;Both;5 reps;Seated Long Arc Quad: AAROM;Both;5 reps;Seated Other Exercises Other Exercises: Forward reach while sitting in egress of bed; x5 BUE; fatigues quickly; RUE weaker than LUE Other Exercises: Lifting trunk off bed with Max A; x5    General Comments General comments (skin integrity, edema, etc.): SpO2 97% on 2L O2. HR 90-109. BP  supine 138/84 (100), sitting in engress 140/76, after sit<>stand 144/85      Pertinent Vitals/Pain Pain Assessment: Faces Faces Pain Scale: Hurts little more Pain Location: Generalized Pain Descriptors / Indicators: Discomfort;Grimacing Pain Intervention(s): Limited activity within patient's tolerance;Monitored during session    Home Living                      Prior Function            PT Goals (current goals can now be found in the care plan section) Acute Rehab PT Goals Patient Stated Goal: to sit up Potential to Achieve Goals: Fair    Frequency    Min 3X/week      PT Plan Current plan remains appropriate    Co-evaluation PT/OT/SLP Co-Evaluation/Treatment: Yes Reason for Co-Treatment: Complexity of the patient's impairments (multi-system involvement);For patient/therapist safety;To address functional/ADL transfers PT goals addressed during session: Mobility/safety with mobility;Strengthening/ROM        AM-PAC PT "6 Clicks" Mobility   Outcome Measure  Help needed turning from your back to your side while in a flat bed without using bedrails?: Total Help needed moving from lying on your back to sitting on the side of a flat bed without using bedrails?: Total Help needed moving to and from a bed to a chair (including a wheelchair)?: Total Help needed standing up from a chair using your arms (e.g., wheelchair or bedside chair)?: Total Help needed to walk in hospital room?: Total Help needed climbing 3-5 steps with a railing? : Total 6 Click Score: 6    End of Session Equipment Utilized During Treatment: Oxygen Activity Tolerance: Patient limited by fatigue Patient left: in bed;with call bell/phone within reach;with bed alarm set Nurse Communication: Mobility status PT Visit Diagnosis: Muscle weakness (generalized) (M62.81);Difficulty in walking, not elsewhere classified (R26.2);Pain     Time: 4193-7902 PT Time Calculation (min) (ACUTE ONLY): 31  min  Charges:  $Therapeutic Activity: 8-22 mins                     Wyona Almas, PT, DPT Acute Rehabilitation Services Pager [phone] Office [place] 10/13/2020, 10:21 AM

## 2020-10-13 NOTE — Progress Notes (Signed)
14 Days Post-Op  Subjective: No acute changes. RN reports desat overnight, SpO2 is 98% on 2L this morning. Patient nods yes/no to questions but appears very weak. Tbili stable at 4, alk phos is normal. BUN/Cr stable, good UOP.  Objective: Vital signs in last 24 hours: Temp:  [99.6 F (37.6 C)-100.7 F (38.2 C)] 100.1 F (37.8 C) (04/04 0400) Pulse Rate:  [88-100] 96 (04/04 0700) Resp:  [16-27] 25 (04/04 0700) BP: (128-145)/(77-89) 136/89 (04/04 0700) SpO2:  [93 %-100 %] 96 % (04/04 0700) Last BM Date:  (pta)  Intake/Output from previous day: 04/03 0701 - 04/04 0700 In: 3950 [I.V.:3850; IV Piggyback:100] Out: 6660 [Urine:4800; Drains:1860] Intake/Output this shift: No intake/output data recorded.  PE: General: resting in bed, deconditioned Neuro: alert, nods yes/no to questions Resp: mildly increased work of breathing on 2L nasal cannula, course breath sounds Abdomen: soft, nondistended, appropriately tender. Midline incision with skin open, some fibrinous exudate at the wound base. RUQ biliary drain with bile, JPx2 with serosanguinous drainage. LUQ pancreatic stent draining clear colorless fluid. GU: condom cath draining clear yellow urine  Lab Results:  Recent Labs    10/11/20 0459 10/13/20 0519  WBC 10.7* 11.1*  HGB 8.2* 7.7*  HCT 26.6* 25.7*  PLT 221 237   BMET Recent Labs    10/12/20 0500 10/13/20 0519  NA 149* 148*  K 3.8 4.0  CL 126* 123*  CO2 18* 17*  GLUCOSE 170* 184*  BUN 79* 79*  CREATININE 2.23* 2.22*  CALCIUM 8.6* 8.7*   PT/INR No results for input(s): LABPROT, INR in the last 72 hours. CMP     Component Value Date/Time   NA 148 (H) 10/13/2020 0519   NA 142 07/21/2020 0921   K 4.0 10/13/2020 0519   CL 123 (H) 10/13/2020 0519   CO2 17 (L) 10/13/2020 0519   GLUCOSE 184 (H) 10/13/2020 0519   BUN 79 (H) 10/13/2020 0519   BUN 16 07/21/2020 0921   CREATININE 2.22 (H) 10/13/2020 0519   CREATININE 1.16 03/01/2016 1607   CALCIUM 8.7 (L)  10/13/2020 0519   PROT 7.1 10/13/2020 0519   PROT 6.7 07/21/2020 0921   ALBUMIN 1.4 (L) 10/13/2020 0519   ALBUMIN 4.1 07/21/2020 0921   AST 78 (H) 10/13/2020 0519   ALT 81 (H) 10/13/2020 0519   ALKPHOS 82 10/13/2020 0519   BILITOT 4.0 (H) 10/13/2020 0519   BILITOT 0.8 07/21/2020 0921   GFRNONAA 35 (L) 10/13/2020 0519   GFRNONAA >89 09/22/2015 1008   GFRAA 82 07/21/2020 0921   GFRAA >89 09/22/2015 1008   Lipase     Component Value Date/Time   LIPASE 23 08/22/2020 0318       Studies/Results: No results found.    Assessment/Plan 50 yo male 3 weeks s/p Whipple, right colectomy and SMV repair for T3N0 neuroendocrine tumor. C/b postop hemorrhagic shock with DIC and subsequent diffuse small bowel necrosis, now s/p small bowel resection with external drainage of bile duct, pancreatic duct and stomach. - Extubated last week. Coarse breath sounds with weak cough. Needs aggressive pulmonary toilet and physical therapy. Scheduled duonebs ordered. Needs IS and flutter valve. - Continue TPN. Hypernatremia improving. On D5W infusion. - Tbili persistently elevated at 4.0. Suspect this is from underlying liver dysfunction and not inadequate biliary drainage, as drain is functioning and alk phos is normal. Fractionated bilirubin is pending. - AKI: stable, good UOP, creatinine slowly downtrending - PT following - Abx off, WBC is normal, afebrile - Continue WTD dressings  BID to midline wound. Will consider vac placement this week. - VTE: SQ[facility] - Dispo: ICU   LOS: 21 days    Michaelle Birks, MD [facility] Surgery General, Hepatobiliary and Pancreatic Surgery 10/13/20 8:00 AM

## 2020-10-13 NOTE — Progress Notes (Signed)
Inpatient Rehab Admissions Coordinator:  Pt is not medically ready for potential CIR admission.  Will continue to follow.   Gayland Curry, [place], [place] Admissions Coordinator [phone]

## 2020-10-13 NOTE — Progress Notes (Signed)
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 s/p right hemicolectomy, end ileostomy, Whipple and patch angioplasty repair of SMV. postoperative hemorrhagic shock with DIC with extensive small bowel necrosis. All of small bowel has been resected, with external drainage of the bile duct, pancreas and stomach. Extubated 3/30. PMH including AICD, heart failure, HTN, and dyslipidemia.   OT comments  Pt continues to present with decreased balance, strength, cognition, and activity tolerance. Pt highly motivated to participate despite fatigue. Use of bed egress to optimize upright posture in seated position with feet supported. Max A +2 for repositioning in bed. Pt participating in [place] exercises, BLE AAROM exercises, grooming task, reaching activity, and trunk support. Continue to recommend dc to CIR for intensive OT and will continue to follow acutely as admitted.   SpO2 97% on 2L O2. HR 90-109. BP supine 138/84, sitting in engress 140/76, after exercises 144/85   Follow Up Recommendations  CIR    Equipment Recommendations  Other (comment) (Defer to nex tvenue)    Recommendations for Other Services PT consult;Speech consult;Rehab consult    Precautions / Restrictions Precautions Precautions: Fall Precaution Comments: x2 JP drains at abdomen and x2 biliary drains       Mobility Bed Mobility               General bed mobility comments: Bed in egress position    Transfers                 General transfer comment: Defer due to safety    Balance Overall balance assessment: Needs assistance Sitting-balance support: Feet supported;Bilateral upper extremity supported Sitting balance-[name]: Poor Sitting balance - Comments: Pt with difficulty maintaining sitting balance when  listing trunk off bed support (bed in egress). Max A for support Postural control: Posterior lean                                 ADL either performed or assessed with clinical judgement   ADL Overall ADL's : Needs assistance/impaired     Grooming: Wash/dry face;Moderate assistance;Sitting (bed in chair position) Grooming Details (indicate cue type and reason): Pt with difficulty bringing RUE to face and requiring Min A for support elbow and bring to face. Pt then fatigues quickly and only wiping his eye gentle for a couple seconds. Requiring assistance to complete task fully                             Functional mobility during ADLs: Total assistance;+2 for physical assistance (unable to achieve bottom off sitting surface during sit<>stand with bed in egress) General ADL Comments: Continues to present with poor acitivty tolerance, strength, and balance. Noting poor cognition and attention     Vision       Perception     Praxis      Cognition Arousal/Alertness: Awake/alert Behavior During Therapy: WFL for tasks assessed/performed Overall Cognitive Status: Difficult to assess                                 General Comments: Soft spoken and difficult to understand. Flucuating between eye open and closed. Follow simple, direct commands  inconsistenly. Noting pt perseverating at times and prior commands; such as pt performing leg exercises during grooming task (which happened right before grooming task). As session progressed, pt following cues more consistently. When asking pt "where are you" pt answered [place].        Exercises Exercises: General Upper Extremity;General Lower Extremity;Other exercises General Exercises - Upper Extremity Shoulder Flexion: AAROM;Both;5 reps;Seated Elbow Flexion: 5 reps;Seated;Strengthening;AAROM;Both Elbow Extension: AAROM;5 reps;Seated;Both General Exercises - Lower Extremity Ankle Circles/Pumps:  Strengthening;Both;AAROM;5 reps;Supine Long Arc Quad: AAROM;Both;5 reps;Seated Other Exercises Other Exercises: Forward reach while sitting in egress of bed; x5 BUE; fatigues quickly; RUE weaker than LUE Other Exercises: Lifting trunk off bed with Max A; x5   Shoulder Instructions       General Comments SpO2 97% on 2L O2. HR 90-109. BP supine 138/84 (100), sitting in engress 140/76, after sit<>stand 144/85    Pertinent Vitals/ Pain       Pain Assessment: Faces Faces Pain Scale: Hurts little more Pain Location: Generalized Pain Descriptors / Indicators: Discomfort;Grimacing Pain Intervention(s): Monitored during session;Limited activity within patient's tolerance;Repositioned  Home Living                                          Prior Functioning/Environment              Frequency  Min 2X/week        Progress Toward Goals  OT Goals(current goals can now be found in the care plan section)  Progress towards OT goals: Progressing toward goals  Acute Rehab OT Goals Patient Stated Goal: to sit up ADL Goals Pt Will Perform Grooming: with min assist;sitting;bed level Pt Will Transfer to Toilet: with +2 assist;stand pivot transfer;bedside commode;with max assist Additional ADL Goal #1: Pt will perform bed mobility with Mod A +2 in preparation for ADLs Additional ADL Goal #2: Pt will tolerate sitting at EOB for 10 minutes with Min A in preparation for ADLs  Plan Discharge plan remains appropriate    Co-evaluation                 AM-PAC OT "6 Clicks" Daily Activity     Outcome Measure   Help from another person eating meals?: Total Help from another person taking care of personal grooming?: A Lot Help from another person toileting, which includes using toliet, bedpan, or urinal?: Total Help from another person bathing (including washing, rinsing, drying)?: Total Help from another person to put on and taking off regular upper body clothing?:  Total Help from another person to put on and taking off regular lower body clothing?: Total 6 Click Score: 7    End of Session Equipment Utilized During Treatment: Oxygen (2L)  OT Visit Diagnosis: Unsteadiness on feet (R26.81);Other abnormalities of gait and mobility (R26.89);Muscle weakness (generalized) (M62.81);Pain Pain - part of body:  (Generalized)   Activity Tolerance Patient limited by fatigue   Patient Left in bed;with call bell/phone within reach;with bed alarm set;with family/visitor present   Nurse Communication Mobility status        Time: 6433-2951 OT Time Calculation (min): 30 min  Charges: OT General Charges $OT Visit: 1 Visit OT Treatments $Therapeutic Activity: 8-22 mins  [place], OTR/L Acute Rehab Pager: [phone] Office: [place] 10/13/2020, 9:48 AM

## 2020-10-14 ENCOUNTER — Inpatient Hospital Stay (HOSPITAL_COMMUNITY): Payer: Managed Care, Other (non HMO)

## 2020-10-14 DIAGNOSIS — D3A8 Other benign neuroendocrine tumors: Secondary | ICD-10-CM | POA: Diagnosis not present

## 2020-10-14 DIAGNOSIS — R5381 Other malaise: Secondary | ICD-10-CM

## 2020-10-14 DIAGNOSIS — N179 Acute kidney failure, unspecified: Secondary | ICD-10-CM | POA: Diagnosis not present

## 2020-10-14 DIAGNOSIS — T17908A Unspecified foreign body in respiratory tract, part unspecified causing other injury, initial encounter: Secondary | ICD-10-CM | POA: Diagnosis not present

## 2020-10-14 DIAGNOSIS — J9621 Acute and chronic respiratory failure with hypoxia: Secondary | ICD-10-CM

## 2020-10-14 LAB — GLUCOSE, CAPILLARY
Glucose-Capillary: 176 mg/dL — ABNORMAL HIGH (ref 70–99)
Glucose-Capillary: 191 mg/dL — ABNORMAL HIGH (ref 70–99)
Glucose-Capillary: 196 mg/dL — ABNORMAL HIGH (ref 70–99)
Glucose-Capillary: 199 mg/dL — ABNORMAL HIGH (ref 70–99)
Glucose-Capillary: 218 mg/dL — ABNORMAL HIGH (ref 70–99)
Glucose-Capillary: 220 mg/dL — ABNORMAL HIGH (ref 70–99)

## 2020-10-14 LAB — COMPREHENSIVE METABOLIC PANEL
ALT: 86 U/L — ABNORMAL HIGH (ref 0–44)
AST: 91 U/L — ABNORMAL HIGH (ref 15–41)
Albumin: 1.5 g/dL — ABNORMAL LOW (ref 3.5–5.0)
Alkaline Phosphatase: 95 U/L (ref 38–126)
Anion gap: 8 (ref 5–15)
BUN: 78 mg/dL — ABNORMAL HIGH (ref 6–20)
CO2: 15 mmol/L — ABNORMAL LOW (ref 22–32)
Calcium: 8.7 mg/dL — ABNORMAL LOW (ref 8.9–10.3)
Chloride: 122 mmol/L — ABNORMAL HIGH (ref 98–111)
Creatinine, Ser: 2.24 mg/dL — ABNORMAL HIGH (ref 0.61–1.24)
GFR, Estimated: 35 mL/min — ABNORMAL LOW (ref >60)
Glucose, Bld: 215 mg/dL — ABNORMAL HIGH (ref 70–99)
Potassium: 4.7 mmol/L (ref 3.5–5.1)
Sodium: 145 mmol/L (ref 135–145)
Total Bilirubin: 3.9 mg/dL — ABNORMAL HIGH (ref 0.3–1.2)
Total Protein: 7.7 g/dL (ref 6.5–8.1)

## 2020-10-14 LAB — BILIRUBIN, FRACTIONATED(TOT/DIR/INDIR)
Bilirubin, Direct: 2.2 mg/dL — ABNORMAL HIGH (ref 0.0–0.2)
Indirect Bilirubin: 1.5 mg/dL — ABNORMAL HIGH (ref 0.3–0.9)
Total Bilirubin: 3.7 mg/dL — ABNORMAL HIGH (ref 0.3–1.2)

## 2020-10-14 LAB — POCT I-STAT 7, (LYTES, BLD GAS, ICA,H+H)
Acid-base deficit: 10 mmol/L — ABNORMAL HIGH (ref 0.0–2.0)
Bicarbonate: 16.3 mmol/L — ABNORMAL LOW (ref 20.0–28.0)
Calcium, Ion: 1.35 mmol/L (ref 1.15–1.40)
HCT: 29 % — ABNORMAL LOW (ref 39.0–52.0)
Hemoglobin: 9.9 g/dL — ABNORMAL LOW (ref 13.0–17.0)
O2 Saturation: 100 %
Patient temperature: 97.7
Potassium: 5.3 mmol/L — ABNORMAL HIGH (ref 3.5–5.1)
Sodium: 149 mmol/L — ABNORMAL HIGH (ref 135–145)
TCO2: 17 mmol/L — ABNORMAL LOW (ref 22–32)
pCO2 arterial: 37.7 mmHg (ref 32.0–48.0)
pH, Arterial: 7.242 — ABNORMAL LOW (ref 7.350–7.450)
pO2, Arterial: 296 mmHg — ABNORMAL HIGH (ref 83.0–108.0)

## 2020-10-14 MED ORDER — PIPERACILLIN-TAZOBACTAM 3.375 G IVPB 30 MIN
3.3750 g | Freq: Once | INTRAVENOUS | Status: AC
  Administered 2020-10-14: 3.375 g via INTRAVENOUS
  Filled 2020-10-14 (×2): qty 50

## 2020-10-14 MED ORDER — SODIUM BICARBONATE 8.4 % IV SOLN
100.0000 meq | Freq: Once | INTRAVENOUS | Status: AC
  Administered 2020-10-14: 100 meq via INTRAVENOUS
  Filled 2020-10-14: qty 50

## 2020-10-14 MED ORDER — NOREPINEPHRINE 4 MG/250ML-% IV SOLN
INTRAVENOUS | Status: AC
  Filled 2020-10-14: qty 250

## 2020-10-14 MED ORDER — FENTANYL CITRATE (PF) 100 MCG/2ML IJ SOLN
INTRAMUSCULAR | Status: AC
  Filled 2020-10-14: qty 2

## 2020-10-14 MED ORDER — MIDAZOLAM HCL 2 MG/2ML IJ SOLN
2.0000 mg | INTRAMUSCULAR | Status: DC | PRN
  Administered 2020-10-14 – 2020-10-22 (×7): 2 mg via INTRAVENOUS
  Filled 2020-10-14 (×8): qty 2

## 2020-10-14 MED ORDER — NOREPINEPHRINE 4 MG/250ML-% IV SOLN
0.0000 ug/min | INTRAVENOUS | Status: DC
  Administered 2020-10-14: 8 ug/min via INTRAVENOUS
  Administered 2020-10-14: 10 ug/min via INTRAVENOUS
  Administered 2020-10-15 (×3): 8 ug/min via INTRAVENOUS
  Administered 2020-10-16: 3 ug/min via INTRAVENOUS
  Administered 2020-10-17: 4 ug/min via INTRAVENOUS
  Filled 2020-10-14 (×5): qty 250

## 2020-10-14 MED ORDER — MIDAZOLAM HCL 2 MG/2ML IJ SOLN
INTRAMUSCULAR | Status: AC
  Filled 2020-10-14: qty 2

## 2020-10-14 MED ORDER — DOCUSATE SODIUM 50 MG/5ML PO LIQD: 100.0000 mg | Freq: Two times a day (BID) | ORAL | Status: DC

## 2020-10-14 MED ORDER — ZINC CHLORIDE 1 MG/ML IV SOLN
INTRAVENOUS | Status: AC
  Filled 2020-10-14: qty 1840.8

## 2020-10-14 MED ORDER — ORAL CARE MOUTH RINSE
15.0000 mL | OROMUCOSAL | Status: DC
  Administered 2020-10-14 – 2020-10-17 (×26): 15 mL via OROMUCOSAL

## 2020-10-14 MED ORDER — POLYETHYLENE GLYCOL 3350 17 G PO PACK: 17.0000 g | PACK | Freq: Every day | ORAL | Status: DC

## 2020-10-14 MED ORDER — FENTANYL CITRATE (PF) 100 MCG/2ML IJ SOLN
50.0000 ug | INTRAMUSCULAR | Status: DC | PRN
  Administered 2020-10-14 (×3): 100 ug via INTRAVENOUS
  Administered 2020-10-15: 200 ug via INTRAVENOUS
  Administered 2020-10-15 (×2): 100 ug via INTRAVENOUS
  Filled 2020-10-14 (×4): qty 2
  Filled 2020-10-14: qty 4
  Filled 2020-10-14: qty 2

## 2020-10-14 MED ORDER — ETOMIDATE 2 MG/ML IV SOLN
INTRAVENOUS | Status: AC
  Administered 2020-10-14: 20 mg
  Filled 2020-10-14: qty 20

## 2020-10-14 MED ORDER — PIPERACILLIN-TAZOBACTAM 3.375 G IVPB
3.3750 g | Freq: Three times a day (TID) | INTRAVENOUS | Status: DC
  Administered 2020-10-15 – 2020-10-18 (×10): 3.375 g via INTRAVENOUS
  Filled 2020-10-14 (×10): qty 50

## 2020-10-14 MED ORDER — MIDAZOLAM HCL 2 MG/2ML IJ SOLN
2.0000 mg | INTRAMUSCULAR | Status: DC | PRN
  Administered 2020-10-14: 2 mg via INTRAVENOUS
  Filled 2020-10-14 (×2): qty 2

## 2020-10-14 MED ORDER — FENTANYL CITRATE (PF) 100 MCG/2ML IJ SOLN: 50.0000 ug | INTRAMUSCULAR | Status: DC | PRN

## 2020-10-14 MED ORDER — SODIUM CHLORIDE 0.9 % IV BOLUS
1000.0000 mL | Freq: Once | INTRAVENOUS | Status: AC
  Administered 2020-10-14: 1000 mL via INTRAVENOUS

## 2020-10-14 MED ORDER — CHLORHEXIDINE GLUCONATE 0.12% ORAL RINSE (MEDLINE KIT)
15.0000 mL | Freq: Two times a day (BID) | OROMUCOSAL | Status: DC
  Administered 2020-10-14 – 2020-10-17 (×6): 15 mL via OROMUCOSAL

## 2020-10-14 MED ORDER — ROCURONIUM BROMIDE 10 MG/ML (PF) SYRINGE
PREFILLED_SYRINGE | INTRAVENOUS | Status: AC
  Administered 2020-10-14: 100 mg
  Filled 2020-10-14: qty 10

## 2020-10-14 NOTE — Progress Notes (Signed)
Pharmacy Antibiotic Note  [name] is a 50 y.o. male admitted on 09/30/2020 with pneumonia.  Pharmacy has been consulted for zosyn dosing.  Pt has been here for an extended period of time for neuroendocrine tumor. He has had several courses of abx. He spiked fever tonight. Empiric Zosyn was ordered to be restarted.   Scr 2.24 CrCl~55 ml/min  Plan: Zosyn 3.375g IV x1 then q8 hr  Height: 6\' 2"  (188 cm) Weight: 124.9 kg (275 lb 5.7 oz) IBW/kg (Calculated) : 82.2  Temp (24hrs), Avg:98.8 F (37.1 C), Min:97.7 F (36.5 C), Max:102.9 F (39.4 C)  Recent Labs  Lab 10/08/20 0544 10/09/20 0411 10/10/20 0351 10/11/20 0459 10/12/20 0500 10/13/20 0519 10/14/20 0223  WBC 10.2 10.2 10.0 10.7*  --  11.1*  --   CREATININE 2.62* 2.58* 2.42* 2.41* 2.23* 2.22* 2.24*    Estimated Creatinine Clearance: 56 mL/min (A) (by C-G formula based on SCr of 2.24 mg/dL (H)).    Allergies  Allergen Reactions  . Ace Inhibitors Cough    Antimicrobials this admission: Vanc 3/18 >> 3/21 CTX 3/17 >> 3/21 Flagyl 3/17 >> 3/21 Zosyn 3/21 >> (3/30) 4/5>> Eraxis 3/22 >> 3/29  Dose adjustments this admission:   Microbiology results: 3/14 MRSA PCR - negative 3/23 BCx - negF 4/5 resp>>  Onnie Boer, PharmD, [place], AAHIVP, CPP Infectious Disease Pharmacist 10/14/2020 9:13 PM

## 2020-10-14 NOTE — Procedures (Signed)
Intubation Procedure Note  [name]  [id number]  [birth year]  Date:10/14/20  Time:9:28 AM   Provider Performing:Bartosz Luginbill W Heber Hugo    Procedure: Intubation (84210)  Indication(s) Respiratory Failure  Consent Risks of the procedure as well as the alternatives and risks of each were explained to the patient and/or caregiver.  Consent for the procedure was obtained and is signed in the bedside chart. Dr. Zenia Resides with surgery discussed intubation in detail with family.    Anesthesia Etomidate and Rocuronium   Time Out Verified patient identification, verified procedure, site/side was marked, verified correct patient position, special equipment/implants available, medications/allergies/relevant history reviewed, required imaging and test results available.   Sterile Technique Usual hand hygeine, masks, and gloves were used   Procedure Description Patient positioned in bed supine.  Sedation given as noted above.  Patient was intubated with endotracheal tube using Glidescope.  View was Grade 1 full glottis .  Number of attempts was 1.  Colorimetric CO2 detector was consistent with tracheal placement.   Complications/Tolerance None; patient tolerated the procedure well. Chest X-ray is ordered to verify placement.   EBL None   Specimen(s) None   Georgann Housekeeper, AGACNP-BC [facility]  See Amion for personal pager PCCM on call pager [phone] until 7pm. Please call Elink 7p-7a. [phone]  10/14/2020 9:29 AM

## 2020-10-14 NOTE — Progress Notes (Signed)
Nutrition Follow-up  DOCUMENTATION CODES:   Not applicable  INTERVENTION:   TNA to meet nutrition needs 20% intralipid 250 ml/12 hr every Mon, Thur to prevent EFAD   Pt at high risk of malnutrition due to catabolic state.  Multiple drains in place with  1645 ml output  NUTRITION DIAGNOSIS:   Increased nutrient needs related to post-op healing as evidenced by estimated needs. Ongoing.   GOAL:   Patient will meet greater than or equal to 90% of their needs Met on TNA.   MONITOR:   Vent status,Labs  REASON FOR ASSESSMENT:   Ventilator    ASSESSMENT:   Pt with a PMH of HTN, HLD, HF with reduced EF, AICD and dx mass adjacent to head of the pancreas in 2021.  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  Developed cholecystitis in Feb 2022 and had percutaneous cholecystectomy tube placement.  Presented this admission for neuroendocrine tumor resection with Whipple procedure, also had right total colectomy, colostomy and cholecystectomy on 09/24/2020.  SMV was lacerated during procedure and repaired by bovine pericardial patch.  Developed hemorrhagic shock with bile leak post-op and underwent placement of biliary T tube with abdominal washout on 3/15.    Pt discussed during ICU rounds and with RN. Per CCM [facility] evaluating for SB transplant.  Pt re-intubated this am per MD likely due to asp PNA, pt having difficulty managing his secretions.    3/14 s/p whipple; intra-operative SMV laceration s/p repair; massive transfusion protocol - 120 products; maxed on pressors  3/15 s/p washout and VAC placement  3/16 off pressors; VAC output decreasing, hematemesis with NG not functioning, OG placed 3/17 s/p re-opening of recent laparotomy, SBR, VAC placement (per OR report pt with SB necrosis, only viable areas are stomach pb limb, 40-50 cm beyond GJ anastomosis, and colon) Pt will be TPN dependent. Concern that PB limb may become necrotic.  3/18 TPN initiated @ 45  ml/hr with plans to advance to 90 ml on 3/19 3/22 entirety of SB resected due to necrosis.  3/25 lipids removed from TPN due to elevated TG 3/30 extubated  4/5 re-intubated  Medications reviewed and include: SSI, protonix (colace and miralax ordered, asked to D/C) Precedex  Levophed @ 10 mcg  Phenergan IV Labs reviewed: Na 158, BUN: 102, Cr: 2.62, Albumin 1.3, TG: 740 --> 613-->299 CBG's: 166-199   Output:  UOP: 3825 ml   Drain 3: 400 ml  R drain: 670 ml  RUQ drain: 470 ml  RLQ drain: 105 ml  20 F G tube: 0 ml Total output: 1645 ml   TPN @ 130 provides: 2748 kcal and 185 grams protein   Diet Order:   Diet Order            Diet NPO time specified  Diet effective now                 EDUCATION NEEDS:   No education needs have been identified at this time  Skin:  Skin Assessment: Reviewed RN Assessment  Last BM:  none  Height:   Ht Readings from Last 1 Encounters:  10/14/20 _0  (1.88 m)    Weight:   Wt Readings from Last 1 Encounters:  10/01/20 124.9 kg    Ideal Body Weight:     BMI:  Body mass index is 35.35 kg/m.  Estimated Nutritional Needs:   Kcal:  2800-3000  Protein:  185-210 grams  Fluid:  > 2L/day  Wanda Rideout P.,  RD, LDN, CNSC See AMiON for contact information

## 2020-10-14 NOTE — Progress Notes (Signed)
[name], MRN:  [id number], DOB:  07/03/1971, LOS: 24 ADMISSION DATE:  09/17/2020, CONSULTATION DATE:  10/14/20 REFERRING MD:  Anesthesia, CHIEF COMPLAINT:  Whipple, post-op shock   Brief History:  50 y.o. M with PMH of neuroendocrine tumor and underwent Whipple procedure 3/14 with intra-operative SMV laceration and repair by vascular with bovine pericardial patch.  Pt had worsening shock overnight and required three pressors despite massive transfusion protocol (received 150 blood products).  He was taken back to the OR for washout on 3/15 and returned to the ICU intubated.  Taken back 3/17 again found to have small bowel necrosis s/p resection.  Plans again to return 3/19 to OR.   Totality of small bowel has been resected. The case was discussed with [facility] who may consider eval for sm intestinal transplant pending clinical course.  Continuing aggressive supportive care.   Past Medical History:   has a past medical history of AICD (automatic cardioverter/defibrillator) present, Dyslipidemia, Heart failure with mildly reduced EF, History of kidney stones, HTN (hypertension), ventricular fibrillation, and Nonischemic cardiomyopathy.   Significant Hospital Events:  3/14: Admit to Surgery, to OR for whipple, SMV injury and repair, massive transfusion protocol overnight  3/15: Back to OR for washout, x2. IR for arteriogram. No major bleed source identified in OR, or arterial bleed w IR. Robust product resuscitation >75 products (+ TXA + novoseven) 3/16: off pressors, add'l 39 products overnight. Slowing resuscitation this morning with hemodynamic improvements and slowing drain output; OR x 2 - washout, biliary t tube, wound vac -- washout, wound vac, IR for arteriogram -- no arterial bleed for embolization 3/17:  Aggressive balanced transfusion continue overnight with marked hemodynamic improvement since yesterday evening. Off pressors x several hours, Wound vac output significantly  slowing (from 54ml q15-26min to 527ml q1hr+) and output is much thinner, Thin bloody secretions from mouth approx 232ml, Decreased RR from 22 to 18, Unable to tolerate NE- severe bradycardia 3/19 taken back to OR. Small bowel noted to be almost completely necrosed. Patient was closed with plans for goals of car discussion.  3/20 GOC family endorses full scope of treatment. Primary team discussed with [facility] the possibility of transfer for small bowel transplant. [facility] felt as though transfer would not change outcome. 3/21 Ongoing discussion with family and [facility]. Patient may be a candidate for transplant if he can stabilize post a small bowel resection. He was taken back to the OR and small bowel was resected.  3/23 weaning pressors. Versed off. Remains encephalopathic 3/24 off pressors. Low dose dilaudid gtt -- only weakly grimaces to pain. Changing sedation to precedex + PRN fentanyl. Long discussion with 2 brothers regarding clinical case -- tried to clarify that while the term "stable" has been used, in this instance is meaning that he has not declined from previous shift but is in fact still critically ill with multisystem organ dysfunction.  3/25 Cr and BUN increased. Off sedation  3/26 Pressors off, CT head benign, BUN/Cr continue to creep up 3/27 Tachycardia/HTN overnight improved with fentanyl gtt 2/28 PCCM sign off. Trauma to take over vent/CCM needs.  3/30 extubated 4/2 desaturated overnight to the 30s improved with BVM and NTS.  4/5 poor airway protection. PCCM consulted re: intubation.   Consults:  PCCM VVS  IR  Palliative  Procedures:  PICC 3/25 > JP x 2  Biliary drain Pancreatic stent drain ETT 4/5 >   Significant Diagnostic Tests:  See radiology tab  Micro Data:  See micro tab  Antimicrobials:  Zosyn 3/21 > 3/29 Eraxis 3/23> 3/29  Interim History / Subjective:  Less responsive overnight. Not managing secretions. PCCM consulted for airway.   Objective   Blood  pressure 129/82, pulse 95, temperature 97.7 F (36.5 C), temperature source Axillary, resp. rate (!) 26, height 6\' 2"  (1.88 m), weight 124.9 kg, SpO2 98 %.        Intake/Output Summary (Last 24 hours) at 10/14/2020 0824 Last data filed at 10/14/2020 0600 Gross per 24 hour  Intake 3851.08 ml  Output 5470 ml  Net -1618.92 ml   Filed Weights   09/16/2020 0639 10/01/20 1503  Weight: 95.3 kg 124.9 kg    Examination:  General:  Chornically ill appearing male Neuro:  Lethargic. No meaningful response. No cough/gag HEENT:  [place]/AT, No JVD noted, PERRL Cardiovascular:  RRR, no MRG Lungs:  Rhonchi throughout.  Abdomen:  Soft, non-distended, non-tender. Multiple drains. JPx2 with murky serosanguinous output. Biliary drain with bile. Pancreatic stent drain.  Musculoskeletal:  No acute deformity Skin:  Intact, MMM    Resolved Hospital Problem list   Hemorrhagic shock hypophosphatemia  Assessment & Plan:   Acute on chronic hypoxemic respiratory failure: due to poor airway protection in the setting of deconditioning and almost certain aspiration.  - STAT intubation - Full vent support - Minimize sedation - Levophed started for post intubation hypotension, hopeful to wean off quickly.  - VAP bundle - Low threshold to initiate treatment for aspiration pneumonia.  Neuroendocrine tumor of the pancreas. S/p  Whipple 9/45 complicated by hemorrhagic shock and DIC. Further complicated by small bowel necrosis s/p complete resection and external drainage of bile duct, pancreatic duct, and stomach.  - Management per surgery - TPN per pharmacy - Ultimate goal is for small bowel transplant at [facility], however, he would need to be quite stable before he would be considered. With the current setback of being re-intubated, this seems quite a ways off. Per surgery.  - Patient will almost certainly require tracheostomy.   AKI: Creatinine remains stable at ~2.25. Electrolytes largely WNL - Trend  BMP  Hyperglycemia: - Per primary  Anemia of critical illness - follow CBC - Transfuse for hgb < 7.    Daily Goals Checklist  Pain/Anxiety/Delirium protocol (if indicated): PRN only for RASS goal 0 to -1 VAP protocol (if indicated): bundle in place.  DVT prophylaxis: Heparin Nutrition Status: TPN GI prophylaxis: Pantoprazole Urinary catheter: external Central lines: PICC 3/25 Glucose control: -SSI + TPN Mobility/therapy needs: Bedrest Restraints: Restraint type:NA Reason for restraints:NA Daily labs: CBC, CMP daily Code Status: Partial - -no CV/DF (has an ICD however) Family Communication: per primary Disposition: ICU.   Goals of Care:  Last date of multidisciplinary goals of care discussion: 9/5 Family and staff present: Dr. Zenia Resides Summary of discussion: Family wishes for aggressive care.   Code Status:  Partial - no CV/DF by [place] (has an ICD which is not to be turned off)  Critical Care time 50 minutes.    Georgann Housekeeper, AGACNP-BC [facility]  See Amion for personal pager PCCM on call pager [phone] until 7pm. Please call Elink 7p-7a. [phone]  10/14/2020 9:34 AM

## 2020-10-14 NOTE — Progress Notes (Signed)
eLink Physician-Brief Progress Note Patient Name: [name] DOB: [birth year] MRN: [id number]   Date of Service  10/14/2020  HPI/Events of Note  Fever to 102.9 F - Patient intubated for respiratory secretions and aspiration. Nursing request for cooling blanket.   eICU Interventions  Plan: 1. Tracheal aspirate culture now. 2. Zosyn per pharmacy consultation. 3. Cooling blanket PRN.      Intervention Category Major Interventions: Infection - evaluation and management  [name] 10/14/2020, 9:01 PM

## 2020-10-14 NOTE — Progress Notes (Signed)
    15 Days Post-Op  Subjective: Increased work of breathing this morning with tachypnea. Very weak. Creatinine stable. Bilirubin 3.9. Drains functioning.   Objective: Vital signs in last 24 hours: Temp:  [97.8 F (36.6 C)-99.2 F (37.3 C)] 97.8 F (36.6 C) (04/05 0400) Pulse Rate:  [75-102] 95 (04/05 0600) Resp:  [18-32] 26 (04/05 0600) BP: (124-144)/(73-93) 129/82 (04/05 0600) SpO2:  [87 %-100 %] 98 % (04/05 0600) Last BM Date:  (pta)  Intake/Output from previous day: 04/04 0701 - 04/05 0700 In: 4016.1 [I.V.:4016.1] Out: 7622 [Urine:3825; Drains:1645] Intake/Output this shift: No intake/output data recorded.  PE: General: appears ill Neuro: alert, nods yes/no to questions Resp: tachypneic with increased work of breathing CV: RRR Abdomen: soft, nondistended, midline incision open at skin, wound is clean and dry. JPx2 with turbid serosanguinous fluid. Biliary drain with bile. LUQ pancreatic stent with clear colorless fluid. G tube in place to gravity LUQ.   Lab Results:  Recent Labs    10/13/20 0519  WBC 11.1*  HGB 7.7*  HCT 25.7*  PLT 237   BMET Recent Labs    10/13/20 0519 10/14/20 0223  NA 148* 145  K 4.0 4.7  CL 123* 122*  CO2 17* 15*  GLUCOSE 184* 215*  BUN 79* 78*  CREATININE 2.22* 2.24*  CALCIUM 8.7* 8.7*   PT/INR No results for input(s): LABPROT, INR in the last 72 hours. CMP     Component Value Date/Time   NA 145 10/14/2020 0223   NA 142 07/21/2020 0921   K 4.7 10/14/2020 0223   CL 122 (H) 10/14/2020 0223   CO2 15 (L) 10/14/2020 0223   GLUCOSE 215 (H) 10/14/2020 0223   BUN 78 (H) 10/14/2020 0223   BUN 16 07/21/2020 0921   CREATININE 2.24 (H) 10/14/2020 0223   CREATININE 1.16 03/01/2016 1607   CALCIUM 8.7 (L) 10/14/2020 0223   PROT 7.7 10/14/2020 0223   PROT 6.7 07/21/2020 0921   ALBUMIN 1.5 (L) 10/14/2020 0223   ALBUMIN 4.1 07/21/2020 0921   AST 91 (H) 10/14/2020 0223   ALT 86 (H) 10/14/2020 0223   ALKPHOS 95 10/14/2020 0223    BILITOT 3.9 (H) 10/14/2020 0223   BILITOT 0.8 07/21/2020 0921   GFRNONAA 35 (L) 10/14/2020 0223   GFRNONAA >89 09/22/2015 1008   GFRAA 82 07/21/2020 0921   GFRAA >89 09/22/2015 1008   Lipase     Component Value Date/Time   LIPASE 23 08/22/2020 0318       Studies/Results: No results found.  Assessment/Plan 50 yo male 3 weeks s/p Whipple, right colectomy and SMV repair for T3N0 neuroendocrine tumor. C/b postop hemorrhagic shock with DIC and subsequent diffuse small bowel necrosis, now s/p small bowel resection with external drainage of bile duct, pancreatic duct and stomach. - ABG and CXR pending this morning. Continue duonebs, NT suctioning, aggressive pulmonary toilet. Patient is having difficulty clearing secretions due to weakness. At high risk for reintubation today. Pulm CCM consulted. - Continue TPN. G tube to gravity, strict NPO. - Consider starting antibiotics if evidence of pneumonia on CXR, given low grade fevers and respiratory distress - VTE: SQH - Dispo: ICU    LOS: 22 days    Michaelle Birks, MD [facility] General, Hepatobiliary and Pancreatic Surgery 10/14/20 8:01 AM

## 2020-10-14 NOTE — Progress Notes (Signed)
Shift Summary: Intubated at 0905  N: Lethargic and oriented x2 this AM. Follows commands weakly, PERRLA, afebrile.   R: before intubation, pt tachypneic in 40s, diaphoretic, endorsed SOB, unable to clear secretions, and overall appeared to be struggling to breathe, though 02 saturations >96%. CCM consult agreed to intubate. Now dim lung sounds, moderate oral and ETT secretions.   CV: NSR, rare PVC. BP requiring titrated levophed.   GI: Strict NPO. Changed abdominal BID dressing. BG requiring SS coverage. No tones or BM. TPN running.   GU: external catheter with adequate UOP.   No new skin issues. Dressings changed and drains emptied.   Sairah Knobloch RN

## 2020-10-14 NOTE — Progress Notes (Signed)
Intubation at 0905. Verbal order by MD for 2mg  Versed, 158mcg Fentanyl, 20mg  Etomidate, 100 mg Rocuronium. During intubation, 20mg  Etomidate and 100mg  Roc given per MD verbal order during procedure. Versed and Fentanyl not used, wasted at pyxis with second RN witness.   CXR obtained for verification.   Bill Yohn RN

## 2020-10-14 NOTE — Progress Notes (Signed)
PHARMACY - TOTAL PARENTERAL NUTRITION CONSULT NOTE  Indication:  Expected prolonged ileus  Patient Measurements: Height: 6\' 2"  (188 cm) Weight: 124.9 kg (275 lb 5.7 oz) IBW/kg (Calculated) : 82.2 TPN AdjBW (KG): 95.3 Body mass index is 35.35 kg/m.  Assessment:  83 YOM with history of mass adjacent to head of the pancreas in 2021.  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  Developed cholecystitis in Feb 2022 and had percutaneous cholecystectomy tube placement.  Presented this admission for neuroendocrine tumor resection with Whipple procedure, also had right total colectomy, colostomy and cholecystectomy on 09/18/2020. SMV was lacerated during procedure and repaired by bovine pericardial patch. Developed hemorrhagic shock with bile leak post-op and underwent placement of biliary T tube with abdominal washout on 3/15 PM. Abdomen remains open. Pharmacy consulted to manage TPN.  Glucose / Insulin: no hx DM, A1c 5.2%. CBGs 180s-190s on D5W @ 35 + TPN.  Utilized 36 units novolog in the past 24h hours. 50 units in TPN -scheduled novolog to 2 units q4h (hold for CBG<150). MD d/c'ed D5w infusion today  Electrolytes: Na normalized (no Na in TPN), Cl 122, HCO3 low (on max acetate) CoCa high at 10.4 (none in TPN), K up to 4.7, Mg 2.3, Phos 3.8  Renal: AKI - SCr stable at 2.24 (BL ~1-1.2), BUN stable at 78 Hepatic: LFTs mildly elevated, Tbili 3.9 (scleral yellowing still noted per RN 4/3) Albumin 1.5, prealbumin 25.1>13.3 (4.4) TG peaked at 740, now back down to 299 on 4/4 - will attempt to supplement EFA 100g/wk and monitor TG trends - will plan for Mon/Thurs supplementation. Next TG check on 4/7 AM. Intake / Output; MIVF: UOP 1.3 ml/kg/hr, drain (4 total): 1856 cc/24h, 0 cc from gastrostomy, net -8.3L GI Imaging: none since TPN start GI Surgeries / Procedures:  3/14 Whipple procedure with R hemicolectomy and end ileostomy, cholecystectomy 3/15 washout, VAC placement 3/17  washout, VAC replacement.  Necrosis of entire small bowel except for the PB limb and proximal 40-50cm of jejunum 3/19 re-opening laparotomy, abd closure.  Necrotic SB. 3/21 SB resection, take down of choledochojejunal anastomosis and pancreaticojejunal anastomosis, take down of duodenojejunal anastomosis, placement of externalized biliary drain / Stamm gastrostomy tube, IA washout and placement of intraperitoneal drains 3/30 extubation  Central access: R IJ CVC placed 3/15; changed to PICC placed 10/03/20 TPN start date: 10/03/2020  Nutritional Goals (RD rec on 3/30): 2800-3000 kCal, 185-210g AA, fluid >2L/day  Current Nutrition:  NPO - failed SLP eval 4/1 TPN  Plan:  - Continue TPN at 130 ml/hr. Will plan to decrease free water in TPN as Na continues to improve  - Planning intralipid 20% 250 cc/12h (~50g lipids) supplements 2x/wk for now to prevent EFAD - last 4/4, planning Mon/Thurs moving forward while monitoring TG trends - TPN with intralipid 2x/wk provides a daily average of 85g AA and 2983 kCal, meeting 100% of patient needs - Electrolytes in TPN: Decrease K to 20 mEq/L, remove Na/Ca, Continue Mg 3 mEq/L, Phos 12 mmol/L, max acetate - Daily multivitamin in TPN.  Remove standard trace elements and add back zinc 5mg , chromium 29mcg, selenium 81mcg  - Continue resistant SSI Q4H and continue with 50 units regular insulin in TPN. D/c scheduled novolog since D5w infusion was stopped  - Standard TPN labs, TG on Mon/Thur  Thank you for allowing pharmacy to be a part of this patient's care.  Albertina Parr, PharmD., BCPS, BCCCP Clinical Pharmacist Please refer to [facility] for unit-specific pharmacist

## 2020-10-15 ENCOUNTER — Inpatient Hospital Stay (HOSPITAL_COMMUNITY): Payer: Managed Care, Other (non HMO)

## 2020-10-15 DIAGNOSIS — J9621 Acute and chronic respiratory failure with hypoxia: Secondary | ICD-10-CM | POA: Diagnosis not present

## 2020-10-15 DIAGNOSIS — Z978 Presence of other specified devices: Secondary | ICD-10-CM | POA: Diagnosis not present

## 2020-10-15 DIAGNOSIS — N179 Acute kidney failure, unspecified: Secondary | ICD-10-CM | POA: Diagnosis not present

## 2020-10-15 DIAGNOSIS — D3A8 Other benign neuroendocrine tumors: Secondary | ICD-10-CM | POA: Diagnosis not present

## 2020-10-15 LAB — COMPREHENSIVE METABOLIC PANEL
ALT: 87 U/L — ABNORMAL HIGH (ref 0–44)
AST: 87 U/L — ABNORMAL HIGH (ref 15–41)
Albumin: 1.4 g/dL — ABNORMAL LOW (ref 3.5–5.0)
Alkaline Phosphatase: 91 U/L (ref 38–126)
Anion gap: 9 (ref 5–15)
BUN: 102 mg/dL — ABNORMAL HIGH (ref 6–20)
CO2: 16 mmol/L — ABNORMAL LOW (ref 22–32)
Calcium: 8.5 mg/dL — ABNORMAL LOW (ref 8.9–10.3)
Chloride: 118 mmol/L — ABNORMAL HIGH (ref 98–111)
Creatinine, Ser: 3.1 mg/dL — ABNORMAL HIGH (ref 0.61–1.24)
GFR, Estimated: 24 mL/min — ABNORMAL LOW (ref >60)
Glucose, Bld: 308 mg/dL — ABNORMAL HIGH (ref 70–99)
Potassium: 5 mmol/L (ref 3.5–5.1)
Sodium: 143 mmol/L (ref 135–145)
Total Bilirubin: 4 mg/dL — ABNORMAL HIGH (ref 0.3–1.2)
Total Protein: 7.5 g/dL (ref 6.5–8.1)

## 2020-10-15 LAB — GLUCOSE, CAPILLARY
Glucose-Capillary: 104 mg/dL — ABNORMAL HIGH (ref 70–99)
Glucose-Capillary: 222 mg/dL — ABNORMAL HIGH (ref 70–99)
Glucose-Capillary: 230 mg/dL — ABNORMAL HIGH (ref 70–99)
Glucose-Capillary: 233 mg/dL — ABNORMAL HIGH (ref 70–99)
Glucose-Capillary: 254 mg/dL — ABNORMAL HIGH (ref 70–99)
Glucose-Capillary: 272 mg/dL — ABNORMAL HIGH (ref 70–99)
Glucose-Capillary: >600 mg/dL (ref 70–99)

## 2020-10-15 LAB — BASIC METABOLIC PANEL
Anion gap: 8 (ref 5–15)
Anion gap: 11 (ref 5–15)
BUN: 115 mg/dL — ABNORMAL HIGH (ref 6–20)
BUN: 121 mg/dL — ABNORMAL HIGH (ref 6–20)
CO2: 12 mmol/L — ABNORMAL LOW (ref 22–32)
CO2: 15 mmol/L — ABNORMAL LOW (ref 22–32)
Calcium: 8.1 mg/dL — ABNORMAL LOW (ref 8.9–10.3)
Calcium: 8 mg/dL — ABNORMAL LOW (ref 8.9–10.3)
Chloride: 119 mmol/L — ABNORMAL HIGH (ref 98–111)
Chloride: 117 mmol/L — ABNORMAL HIGH (ref 98–111)
Creatinine, Ser: 3.54 mg/dL — ABNORMAL HIGH (ref 0.61–1.24)
Creatinine, Ser: 3.68 mg/dL — ABNORMAL HIGH (ref 0.61–1.24)
GFR, Estimated: 20 mL/min — ABNORMAL LOW (ref >60)
GFR, Estimated: 19 mL/min — ABNORMAL LOW (ref >60)
Glucose, Bld: 274 mg/dL — ABNORMAL HIGH (ref 70–99)
Glucose, Bld: 109 mg/dL — ABNORMAL HIGH (ref 70–99)
Potassium: 5.8 mmol/L — ABNORMAL HIGH (ref 3.5–5.1)
Potassium: 4.8 mmol/L (ref 3.5–5.1)
Sodium: 139 mmol/L (ref 135–145)
Sodium: 143 mmol/L (ref 135–145)

## 2020-10-15 LAB — POCT I-STAT 7, (LYTES, BLD GAS, ICA,H+H)
Acid-base deficit: 13 mmol/L — ABNORMAL HIGH (ref 0.0–2.0)
Bicarbonate: 13.4 mmol/L — ABNORMAL LOW (ref 20.0–28.0)
Calcium, Ion: 1.27 mmol/L (ref 1.15–1.40)
HCT: 23 % — ABNORMAL LOW (ref 39.0–52.0)
Hemoglobin: 7.8 g/dL — ABNORMAL LOW (ref 13.0–17.0)
O2 Saturation: 95 %
Patient temperature: 99.6
Potassium: 5.5 mmol/L — ABNORMAL HIGH (ref 3.5–5.1)
Sodium: 146 mmol/L — ABNORMAL HIGH (ref 135–145)
TCO2: 14 mmol/L — ABNORMAL LOW (ref 22–32)
pCO2 arterial: 33.4 mmHg (ref 32.0–48.0)
pH, Arterial: 7.214 — ABNORMAL LOW (ref 7.350–7.450)
pO2, Arterial: 91 mmHg (ref 83.0–108.0)

## 2020-10-15 LAB — CBC
HCT: 26 % — ABNORMAL LOW (ref 39.0–52.0)
Hemoglobin: 7 g/dL — ABNORMAL LOW (ref 13.0–17.0)
MCH: 28.5 pg (ref 26.0–34.0)
MCHC: 26.9 g/dL — ABNORMAL LOW (ref 30.0–36.0)
MCV: 105.7 fL — ABNORMAL HIGH (ref 80.0–100.0)
Platelets: 283 10*3/uL (ref 150–400)
RBC: 2.46 MIL/uL — ABNORMAL LOW (ref 4.22–5.81)
RDW: 21.7 % — ABNORMAL HIGH (ref 11.5–15.5)
WBC: 15.4 10*3/uL — ABNORMAL HIGH (ref 4.0–10.5)
nRBC: 0.3 % — ABNORMAL HIGH (ref 0.0–0.2)

## 2020-10-15 LAB — PHOSPHORUS: Phosphorus: 7.5 mg/dL — ABNORMAL HIGH (ref 2.5–4.6)

## 2020-10-15 LAB — NA AND K (SODIUM & POTASSIUM), RAND UR
Potassium Urine: 36 mmol/L
Sodium, Ur: 61 mmol/L

## 2020-10-15 LAB — PREPARE RBC (CROSSMATCH)

## 2020-10-15 MED ORDER — FENTANYL 2500MCG IN NS 250ML (10MCG/ML) PREMIX INFUSION
50.0000 ug/h | INTRAVENOUS | Status: DC
  Administered 2020-10-15: 50 ug/h via INTRAVENOUS
  Administered 2020-10-16: 100 ug/h via INTRAVENOUS
  Filled 2020-10-15 (×2): qty 250

## 2020-10-15 MED ORDER — DEXTROSE 10 % IV SOLN: INTRAVENOUS | Status: AC

## 2020-10-15 MED ORDER — FENTANYL BOLUS VIA INFUSION
50.0000 ug | INTRAVENOUS | Status: DC | PRN
  Filled 2020-10-15: qty 100

## 2020-10-15 MED ORDER — ZINC CHLORIDE 1 MG/ML IV SOLN
INTRAVENOUS | Status: DC
  Filled 2020-10-15: qty 1840.8

## 2020-10-15 MED ORDER — INSULIN ASPART 100 UNIT/ML ~~LOC~~ SOLN
2.0000 [IU] | SUBCUTANEOUS | Status: DC
  Administered 2020-10-15 – 2020-10-31 (×93): 2 [IU] via SUBCUTANEOUS

## 2020-10-15 MED ORDER — SODIUM CHLORIDE 0.45 % IV SOLN
INTRAVENOUS | Status: AC
  Filled 2020-10-15 (×5): qty 75

## 2020-10-15 MED ORDER — SODIUM BICARBONATE 8.4 % IV SOLN
100.0000 meq | Freq: Once | INTRAVENOUS | Status: AC
  Administered 2020-10-15: 100 meq via INTRAVENOUS
  Filled 2020-10-15: qty 100

## 2020-10-15 MED ORDER — SODIUM CHLORIDE 0.9 % IV BOLUS
1000.0000 mL | Freq: Once | INTRAVENOUS | Status: AC
  Administered 2020-10-15: 1000 mL via INTRAVENOUS

## 2020-10-15 MED ORDER — FUROSEMIDE 10 MG/ML IJ SOLN
20.0000 mg | Freq: Once | INTRAMUSCULAR | Status: DC
  Filled 2020-10-15: qty 2

## 2020-10-15 MED ORDER — CALCIUM GLUCONATE-NACL 1-0.675 GM/50ML-% IV SOLN
1.0000 g | Freq: Once | INTRAVENOUS | Status: AC
  Administered 2020-10-15: 1000 mg via INTRAVENOUS
  Filled 2020-10-15: qty 50

## 2020-10-15 MED ORDER — FUROSEMIDE 10 MG/ML IJ SOLN
40.0000 mg | Freq: Once | INTRAMUSCULAR | Status: AC
  Administered 2020-10-15: 40 mg via INTRAVENOUS
  Filled 2020-10-15: qty 4

## 2020-10-15 MED ORDER — SODIUM CHLORIDE 0.9% IV SOLUTION: Freq: Once | INTRAVENOUS | Status: AC

## 2020-10-15 NOTE — Progress Notes (Signed)
Inpatient Rehabilitation Admissions Coordinator  Noted reintubated. We will sign off at this time. Please re consult when appropriate.  Danne Baxter, RN, MSN Rehab Admissions Coordinator [phone] 10/15/2020 4:20 PM

## 2020-10-15 NOTE — Progress Notes (Signed)
16 Days Post-Op  Subjective: Patient intubated yesterday morning. Has been on levophed at 6-7 since intubation. Febrile overnight to 38.4, Zosyn started. WBC 15 this morning, CMP pending.   Objective: Vital signs in last 24 hours: Temp:  [96.7 F (35.9 C)-102.9 F (39.4 C)] 96.7 F (35.9 C) (04/06 0400) Pulse Rate:  [74-102] 87 (04/06 0600) Resp:  [11-35] 24 (04/06 0600) BP: (73-141)/(47-86) 129/58 (04/06 0600) SpO2:  [92 %-100 %] 100 % (04/06 0822) FiO2 (%):  [40 %-100 %] 40 % (04/06 0822) Last BM Date:  (pta)  Intake/Output from previous day: 04/05 0701 - 04/06 0700 In: 4372.2 [I.V.:3494.6; IV Piggyback:877.6] Out: 3650 [Urine:2025; Drains:1625] Intake/Output this shift: No intake/output data recorded.  PE: General: appears ill Resp: intubated, on vent CV: RRR Abdomen: soft, nondistended, midline incision open at skin, wound is clean and dry. JPx2 with turbid seropurulent fluid. Biliary drain with bile. LUQ pancreatic stent with clear colorless fluid. G tube in place to gravity LUQ.   Lab Results:  Recent Labs    10/13/20 0519 10/14/20 1121 10/15/20 0703  WBC 11.1*  --  15.4*  HGB 7.7* 9.9* 7.0*  HCT 25.7* 29.0* 26.0*  PLT 237  --  283   BMET Recent Labs    10/13/20 0519 10/14/20 0223 10/14/20 1121  NA 148* 145 149*  K 4.0 4.7 5.3*  CL 123* 122*  --   CO2 17* 15*  --   GLUCOSE 184* 215*  --   BUN 79* 78*  --   CREATININE 2.22* 2.24*  --   CALCIUM 8.7* 8.7*  --    PT/INR No results for input(s): LABPROT, INR in the last 72 hours. CMP     Component Value Date/Time   NA 149 (H) 10/14/2020 1121   NA 142 07/21/2020 0921   K 5.3 (H) 10/14/2020 1121   CL 122 (H) 10/14/2020 0223   CO2 15 (L) 10/14/2020 0223   GLUCOSE 215 (H) 10/14/2020 0223   BUN 78 (H) 10/14/2020 0223   BUN 16 07/21/2020 0921   CREATININE 2.24 (H) 10/14/2020 0223   CREATININE 1.16 03/01/2016 1607   CALCIUM 8.7 (L) 10/14/2020 0223   PROT 7.7 10/14/2020 0223   PROT 6.7  07/21/2020 0921   ALBUMIN 1.5 (L) 10/14/2020 0223   ALBUMIN 4.1 07/21/2020 0921   AST 91 (H) 10/14/2020 0223   ALT 86 (H) 10/14/2020 0223   ALKPHOS 95 10/14/2020 0223   BILITOT 3.7 (H) 10/14/2020 0739   BILITOT 0.8 07/21/2020 0921   GFRNONAA 35 (L) 10/14/2020 0223   GFRNONAA >89 09/22/2015 1008   GFRAA 82 07/21/2020 0921   GFRAA >89 09/22/2015 1008   Lipase     Component Value Date/Time   LIPASE 23 08/22/2020 0318       Studies/Results: DG CHEST PORT 1 VIEW  Result Date: 10/14/2020 CLINICAL DATA:  Hypoxia EXAM: PORTABLE CHEST 1 VIEW COMPARISON:  October 14, 2020 study obtained earlier in the day FINDINGS: Endotracheal tube tip is 3.9 cm above the carina. Central catheter tip is in the superior vena cava. Pacemaker lead attached to right ventricle. No adenopathy. Lungs clear. Heart size and pulmonary vascular normal. No adenopathy. No bone lesions. IMPRESSION: Tube and catheter positions as described without pneumothorax. Lungs clear. Cardiac silhouette normal. Electronically Signed   By: Lowella Grip III M.D.   On: 10/14/2020 09:38   DG CHEST PORT 1 VIEW  Result Date: 10/14/2020 CLINICAL DATA:  Shortness of breath. EXAM: PORTABLE CHEST 1 VIEW  COMPARISON:  10/11/2020. FINDINGS: Right PICC line stable position. Cardiac pacer stable position. Heart size normal. Mild left base atelectasis/infiltrate. No pleural effusion or pneumothorax. Degenerative changes scoliosis thoracic spine. IMPRESSION: 1. Right PICC line stable position. Cardiac pacer stable position. Heart size stable. 2.  Mild left base atelectasis/infiltrate. Electronically Signed   By: Marcello Moores  Register   On: 10/14/2020 08:31    Assessment/Plan 50 yo male 3 weeks s/p Whipple, right colectomy and SMV repair for T3N0 neuroendocrine tumor. C/b postop hemorrhagic shock with DIC and subsequent diffuse small bowel necrosis, now s/p small bowel resection with external drainage of bile duct, pancreatic duct and stomach. - CT  chest/abd/pelvis today to evaluate for pneumonia and undrained fluid within the abdomen given fevers and worsening leukocytosis. Noncontrast due to AKI. - Continue TPN. G tube to gravity, strict NPO. - Continue antibiotics - Patient will likely need trach due to weakness and difficulty with clearing secretions. I discussed this with family yesterday and they are agreeable. - VTE: SQH - Dispo: ICU. Family has inquired about small bowel transplant. I think this is the patient's only chance for long-term survival but he is currently too ill to be a transplant candidate. I have communicated with family that he has to recover and remain stable on TPN to be considered. They have been clear that they want to continue aggressive medical management.    LOS: 23 days    Michaelle Birks, MD [facility] General, Hepatobiliary and Pancreatic Surgery 10/15/20 8:37 AM

## 2020-10-15 NOTE — Progress Notes (Signed)
Physical Therapy Treatment Patient Details Name: [name] MRN: [id number] DOB: 03-22-1971 Today's Date: 10/15/2020    History of Present Illness 50 yo male presenting 3/14 with neuroendocrine tumor of the head of the pancrease. s/p right hemicolectomy, end ileostomy, Whipple and patch angioplasty repair of SMV. postoperative hemorrhagic shock with DIC with extensive small bowel necrosis. All of small bowel has been resected, with external drainage of the bile duct, pancreas and stomach. Extubated 3/30. Reintubated 4/5. PMH including AICD, heart failure, HTN, and dyslipidemia.    PT Comments    Pt reintubated 4/5. Able to visually track and squeeze my hand, but otherwise not following commands. Performed PROM to all extremities, manual cervical left lateral flexion and rotation stretch, and repositioned to promote neutral alignment. Updated d/c plan.    Follow Up Recommendations  SNF;LTACH     Equipment Recommendations  Wheelchair (measurements PT);Wheelchair cushion (measurements PT);Hospital bed;Other (comment) (hoyer lift)    Recommendations for Other Services       Precautions / Restrictions Precautions Precautions: Fall;Other (comment) Precaution Comments: ETT, x2 JP drains at abdomen and x2 biliary drains Restrictions Weight Bearing Restrictions: No    Mobility  Bed Mobility Overal bed mobility: Needs Assistance             General bed mobility comments: TotalA + 2 for repositioning, Bed in egress position    Transfers                    Ambulation/Gait                 Stairs             Wheelchair Mobility    Modified Rankin (Stroke Patients Only)       Balance                                            Cognition Arousal/Alertness: Awake/alert Behavior During Therapy: WFL for tasks assessed/performed Overall Cognitive Status: Difficult to assess                                 General  Comments: Pt able to visually track and squeeze hands, otherwise not following commands      Exercises General Exercises - Upper Extremity Shoulder Flexion: Both;PROM;10 reps;Supine Elbow Flexion: Both;PROM;10 reps;Supine Elbow Extension: Both;PROM;10 reps;Supine Digit Composite Flexion: PROM;Both;10 reps;Supine Composite Extension: PROM;Both;10 reps;Supine General Exercises - Lower Extremity Ankle Circles/Pumps: Both;PROM;10 reps;Supine Heel Slides: PROM;Both;10 reps;Supine Other Exercises Other Exercises: Supine: left lateral flexion and cervical rotation stretch    General Comments        Pertinent Vitals/Pain Pain Assessment: Faces Faces Pain Scale: Hurts little more Pain Location: neck with movement Pain Descriptors / Indicators: Grimacing Pain Intervention(s): Monitored during session;Repositioned    Home Living                      Prior Function            PT Goals (current goals can now be found in the care plan section) Acute Rehab PT Goals Patient Stated Goal: unable to state Potential to Achieve Goals: Fair Progress towards PT goals: Not progressing toward goals - comment    Frequency    Min 2X/week      PT Plan Discharge plan needs  to be updated    Co-evaluation              AM-PAC PT "6 Clicks" Mobility   Outcome Measure  Help needed turning from your back to your side while in a flat bed without using bedrails?: Total Help needed moving from lying on your back to sitting on the side of a flat bed without using bedrails?: Total Help needed moving to and from a bed to a chair (including a wheelchair)?: Total Help needed standing up from a chair using your arms (e.g., wheelchair or bedside chair)?: Total Help needed to walk in hospital room?: Total Help needed climbing 3-5 steps with a railing? : Total 6 Click Score: 6    End of Session Equipment Utilized During Treatment: Oxygen Activity Tolerance: Patient limited by  lethargy Patient left: in bed;with call bell/phone within reach Nurse Communication: Mobility status PT Visit Diagnosis: Muscle weakness (generalized) (M62.81);Difficulty in walking, not elsewhere classified (R26.2);Pain     Time: 1435-1454 PT Time Calculation (min) (ACUTE ONLY): 19 min  Charges:  $Therapeutic Exercise: 8-22 mins                     Wyona Almas, PT, DPT Acute Rehabilitation Services Pager [phone] Office [place] 10/15/2020, 5:10 PM

## 2020-10-15 NOTE — Progress Notes (Signed)
Pt transported to CT and back to 4N 30 on full vent support. No complications noted.

## 2020-10-15 NOTE — Progress Notes (Signed)
Nurse Case Manager Advice worker), will ensure family has this information  Veda [phone] ext [phone]

## 2020-10-15 NOTE — TOC Progression Note (Addendum)
Transition of Care [facility] - Progression Note    Patient Details  Name: [name] MRN: [id number] Date of Birth: 08-Sep-1970  Transition [facility]) CM/SW Contact  Ella Bodo, RN Phone Number: 10/15/2020, 5:07 PM  Clinical Narrative:   Reached out to Dr. Zenia Resides to see if pt appropriate for LTAC after tracheostomy.  She states she prefers to wait on this for now; feels pt will be in ICU a while longer, and that intensive rehab would be preferable.       Expected Discharge Plan: IP Rehab Facility Barriers to Discharge: Continued Medical Work up  Expected Discharge Plan and Services Expected Discharge Plan: [place]   Discharge Planning Services: CM Consult   Living arrangements for the past 2 months: Single Family Home                                       Social Determinants of Health (SDOH) Interventions    Readmission Risk Interventions No flowsheet data found.  Reinaldo Raddle, RN, BSN  Trauma/Neuro ICU Case Manager [phone]

## 2020-10-15 NOTE — Progress Notes (Signed)
Plan for trach in OR 4/8 AM. Will obtain consent from family.   [name], MD General and [place] [facility]

## 2020-10-15 NOTE — Consult Note (Addendum)
Reason for Consult: Renal failure Referring Physician:  Dr. Bobbye Morton  Chief Complaint: Neuroendocrine tumor here for resection  Assessment/Plan: 1. Acute renal failure - he has reasons to be in ATN  With hypotensive episodes requiring Levophed in the setting of a very complicated surgical history with multiple visits to the OR + DIC + diffuse small bowel necrosis. Creatinine fluctuating represents compromised renal function with unfortunately repeated insults. No evidence of obstruction and UOP may be starting to decrease  over the past 24hrs; continued purulent drainage from JP drain in abdomen. According to charting he is neg 9848 mL during this hospitalization. - Will treat the acidosis with 1/2NS + 1.5 amps HCO3; the Na will help with distal delivery for Na/K exchange and drive the K intracellularly as well. - Continue insulin and will also challenge with Lasix for the hyperkalemia as well. - Will check with inpatient pharmacy to determine whether the K can be decreased further in the TPN -> hold until TPN is remade tomorrow (currently 35mq/L) -> inpt pharmacy to stop the TPN and hand D10 instead. - [facility]. - RRT is a possibility especially with the family wanting aggressive medical treatment; but I would favor this only as a bridge to small bowel transplant with a defined time interval of RRT to determine whether there is any improvement in overall status. - Avoid nephrotoxins and dose medications for a GFR < 15 ml/min. 2. Respirator failure reintubated now on the ventilator. 3. Neuroendocrine tumor s/p Whipple with unfortunate extensive small bowel necrosis s/p resection. 4. Severe malnutrition on TPN. 5. Anemia   HPI: Billy FINNIGANis an 50y.o. male CHF with EF 35-40%, HTN, AICD secondary to vfib, nonischemic CM, nephrolithiasis who initially presented in 05/2020 with jaundice and abdominal pain with CT showing a 4cm mass adjacent  to the head of the pancreas. He had an ERCP with sludge from the bile duct + sphincterotomy leading to normalization of LFT's. Later EUS 06/25/20 showed extrinsic compression of the 3rd portion of the duodenum with FNA showing a neuroendocrine tumor. Unfortunately he developed acute cholecystitis in 08/2020 -> perc cholecystostomy tube with improvement of symptoms. He had Whipple and right hemicholectomy on 3/14 but required a abdominal washout + biliary t-tube the following day for hemorrhagic shock requiring mutliple pressors with blood transfusions with noted bile leak at the choledochojejunal anastomotic site on re-exploration. Patient had 412mof iohexol on 3/16 with no e/o active arterial bleeding at that time. Patient also had to return to the OR on 3/17 with small bowel resection with PB limb and proximal 50cm of jejunum beyond the anastomosis still viable -> eventually all of the small bowel was resected. Hypernatremia treated with D5W infusion.  Creatinine was in the 1.2-1.44 range in mid to late 2021 but  was in the low 2's earlier this admission; his creatinine had  fluctuated between 2.5-3.5  improving to 2.2  4/5 but since then has uptrended to 3.54 on day of consultation. UOP has remained good but appeared to be decreasing since 4/5. According to charting he is neg 9848 during this hospitalization. There have been relative hypotensive episodes as well but he is currently off  Levophed.   ROS Pertinent items are noted in HPI.  Chemistry and CBC: Creat  Date/Time Value Ref Range Status  03/01/2016 04:07 PM 1.16 0.60 - 1.35 mg/dL Final  09/22/2015 10:08 AM 0.98 0.60 - 1.35 mg/dL Final  07/16/2014 01:05 PM  1.18 0.50 - 1.35 mg/dL Final  01/29/2014 04:24 PM 1.09 0.50 - 1.35 mg/dL Final   Creatinine, Ser  Date/Time Value Ref Range Status  10/15/2020 04:00 PM 3.54 (H) 0.61 - 1.24 mg/dL Final  10/15/2020 08:59 AM 3.10 (H) 0.61 - 1.24 mg/dL Final  10/14/2020 02:23 AM 2.24 (H) 0.61 - 1.24 mg/dL  Final  10/13/2020 05:19 AM 2.22 (H) 0.61 - 1.24 mg/dL Final  10/12/2020 05:00 AM 2.23 (H) 0.61 - 1.24 mg/dL Final  10/11/2020 04:59 AM 2.41 (H) 0.61 - 1.24 mg/dL Final  10/10/2020 03:51 AM 2.42 (H) 0.61 - 1.24 mg/dL Final  10/09/2020 04:11 AM 2.58 (H) 0.61 - 1.24 mg/dL Final  10/08/2020 05:44 AM 2.62 (H) 0.61 - 1.24 mg/dL Final  10/07/2020 05:45 AM 2.54 (H) 0.61 - 1.24 mg/dL Final  10/06/2020 04:56 AM 3.03 (H) 0.61 - 1.24 mg/dL Final  10/05/2020 04:07 AM 3.21 (H) 0.61 - 1.24 mg/dL Final  10/04/2020 05:21 AM 2.87 (H) 0.61 - 1.24 mg/dL Final  10/03/2020 03:42 AM 2.82 (H) 0.61 - 1.24 mg/dL Final  10/02/2020 05:00 AM 2.53 (H) 0.61 - 1.24 mg/dL Final  10/01/2020 04:19 AM 2.91 (H) 0.61 - 1.24 mg/dL Final  09/30/2020 07:36 AM 3.13 (H) 0.61 - 1.24 mg/dL Final  10/01/2020 04:24 PM 3.00 (H) 0.61 - 1.24 mg/dL Final  09/10/2020 04:38 AM 2.93 (H) 0.61 - 1.24 mg/dL Final  09/28/2020 05:33 AM 2.21 (H) 0.61 - 1.24 mg/dL Final  09/16/2020 05:53 AM 2.05 (H) 0.61 - 1.24 mg/dL Final  09/26/2020 05:22 AM 2.39 (H) 0.61 - 1.24 mg/dL Final  09/24/2020 04:27 AM 3.10 (H) 0.61 - 1.24 mg/dL Final  09/24/2020 12:19 PM 2.70 (H) 0.61 - 1.24 mg/dL Final  09/24/2020 04:00 AM 2.61 (H) 0.61 - 1.24 mg/dL Final  09/24/2020 12:04 AM 2.49 (H) 0.61 - 1.24 mg/dL Final  09/12/2020 09:53 PM 2.32 (H) 0.61 - 1.24 mg/dL Final  10/05/2020 12:28 PM 2.14 (H) 0.61 - 1.24 mg/dL Final  09/13/2020 10:22 AM 1.90 (H) 0.61 - 1.24 mg/dL Final  10/02/2020 09:13 AM 1.60 (H) 0.61 - 1.24 mg/dL Final  09/11/2020 04:13 AM 2.53 (H) 0.61 - 1.24 mg/dL Final    Comment:    DELTA CHECK NOTED  09/18/2020 05:28 PM 1.28 (H) 0.61 - 1.24 mg/dL Final  09/16/2020 02:28 PM 1.17 0.61 - 1.24 mg/dL Final  08/26/2020 04:18 PM 1.21 0.61 - 1.24 mg/dL Final  08/22/2020 03:18 AM 1.18 0.61 - 1.24 mg/dL Final  08/21/2020 10:37 AM 1.07 0.61 - 1.24 mg/dL Final  07/21/2020 09:21 AM 1.19 0.76 - 1.27 mg/dL Final  07/03/2020 04:43 PM 0.96 0.76 - 1.27 mg/dL Final   05/28/2020 02:24 AM 1.37 (H) 0.61 - 1.24 mg/dL Final  05/27/2020 03:54 AM 1.39 (H) 0.61 - 1.24 mg/dL Final  05/26/2020 03:34 AM 1.41 (H) 0.61 - 1.24 mg/dL Final  05/25/2020 03:36 AM 1.50 (H) 0.61 - 1.24 mg/dL Final  05/24/2020 07:56 PM 1.44 (H) 0.61 - 1.24 mg/dL Final  12/03/2019 05:03 PM 1.27 0.76 - 1.27 mg/dL Final  06/12/2019 08:28 AM 1.34 (H) 0.76 - 1.27 mg/dL Final  05/18/2019 04:07 PM 1.40 (H) 0.76 - 1.27 mg/dL Final  11/16/2018 04:41 PM 1.27 0.76 - 1.27 mg/dL Final  06/13/2018 04:23 PM 1.30 (H) 0.76 - 1.27 mg/dL Final  04/03/2018 02:15 PM 1.39 (H) 0.76 - 1.27 mg/dL Final  01/21/2018 04:27 PM 2.04 (H) 0.61 - 1.24 mg/dL Final  01/21/2018 01:13 PM 2.00 (H) 0.61 - 1.24 mg/dL Final  08/08/2017 12:14 PM 1.18 0.76 - 1.27 mg/dL  Final   Recent Labs  Lab 10/09/20 0411 10/10/20 0351 10/11/20 0459 10/12/20 0500 10/13/20 0519 10/14/20 0223 10/14/20 1121 10/15/20 0859 10/15/20 1353 10/15/20 1600  NA 158* 155* 150* 149* 148* 145 149* 143 146* 139  K 4.2 3.9 3.6 3.8 4.0 4.7 5.3* 5.0 5.5* 5.8*  CL 130* 130* 125* 126* 123* 122*  --  118*  --  119*  CO2 23 20* 18* 18* 17* 15*  --  16*  --  12*  GLUCOSE 232* 265* 191* 170* 184* 215*  --  308*  --  274*  BUN 96* 87* 83* 79* 79* 78*  --  102*  --  115*  CREATININE 2.58* 2.42* 2.41* 2.23* 2.22* 2.24*  --  3.10*  --  3.54*  CALCIUM 8.8* 8.7* 8.6* 8.6* 8.7* 8.7*  --  8.5*  --  8.1*  PHOS 3.3  --   --  3.4 3.8  --   --   --   --  7.5*   Recent Labs  Lab 10/10/20 0351 10/11/20 0459 10/13/20 0519 10/14/20 1121 10/15/20 0703 10/15/20 1353  WBC 10.0 10.7* 11.1*  --  15.4*  --   NEUTROABS  --   --  8.9*  --   --   --   HGB 7.9* 8.2* 7.7* 9.9* 7.0* 7.8*  HCT 25.7* 26.6* 25.7* 29.0* 26.0* 23.0*  MCV 92.1 92.7 93.1  --  105.7*  --   PLT 195 221 237  --  283  --    Liver Function Tests: Recent Labs  Lab 10/13/20 0519 10/14/20 0223 10/14/20 0739 10/15/20 0859  AST 78* 91*  --  87*  ALT 81* 86*  --  87*  ALKPHOS 82 95  --  91   BILITOT 4.0* 3.9* 3.7* 4.0*  PROT 7.1 7.7  --  7.5  ALBUMIN 1.4* 1.5*  --  1.4*   No results for input(s): LIPASE, AMYLASE in the last 168 hours. No results for input(s): AMMONIA in the last 168 hours. Cardiac Enzymes: No results for input(s): CKTOTAL, CKMB, CKMBINDEX, TROPONINI in the last 168 hours. Iron Studies: No results for input(s): IRON, TIBC, TRANSFERRIN, FERRITIN in the last 72 hours. PT/INR: @LABRCNTIP (inr:5)  Xrays/Other Studies: ) Results for orders placed or performed during the hospital encounter of 09/17/2020 (from the past 48 hour(s))  Glucose, capillary     Status: Abnormal   Collection Time: 10/13/20  8:00 PM  Result Value Ref Range   Glucose-Capillary 182 (H) 70 - 99 mg/dL    Comment: Glucose reference range applies only to samples taken after fasting for at least 8 hours.  Glucose, capillary     Status: Abnormal   Collection Time: 10/13/20 11:51 PM  Result Value Ref Range   Glucose-Capillary 193 (H) 70 - 99 mg/dL    Comment: Glucose reference range applies only to samples taken after fasting for at least 8 hours.  Comprehensive metabolic panel     Status: Abnormal   Collection Time: 10/14/20  2:23 AM  Result Value Ref Range   Sodium 145 135 - 145 mmol/L   Potassium 4.7 3.5 - 5.1 mmol/L   Chloride 122 (H) 98 - 111 mmol/L   CO2 15 (L) 22 - 32 mmol/L   Glucose, Bld 215 (H) 70 - 99 mg/dL    Comment: Glucose reference range applies only to samples taken after fasting for at least 8 hours.   BUN 78 (H) 6 - 20 mg/dL   Creatinine, Ser 2.24 (H) 0.61 -  1.24 mg/dL   Calcium 8.7 (L) 8.9 - 10.3 mg/dL   Total Protein 7.7 6.5 - 8.1 g/dL   Albumin 1.5 (L) 3.5 - 5.0 g/dL   AST 91 (H) 15 - 41 U/L   ALT 86 (H) 0 - 44 U/L   Alkaline Phosphatase 95 38 - 126 U/L   Total Bilirubin 3.9 (H) 0.3 - 1.2 mg/dL   GFR, Estimated 35 (L) >60 mL/min    Comment: (NOTE) Calculated using the CKD-EPI Creatinine Equation (2021)    Anion gap 8 5 - 15    Comment: Performed at [facility] [address] [place], [place] [address]  Glucose, capillary     Status: Abnormal   Collection Time: 10/14/20  4:06 AM  Result Value Ref Range   Glucose-Capillary 199 (H) 70 - 99 mg/dL    Comment: Glucose reference range applies only to samples taken after fasting for at least 8 hours.  Bilirubin, fractionated(tot/dir/indir)     Status: Abnormal   Collection Time: 10/14/20  7:39 AM  Result Value Ref Range   Total Bilirubin 3.7 (H) 0.3 - 1.2 mg/dL   Bilirubin, Direct 2.2 (H) 0.0 - 0.2 mg/dL   Indirect Bilirubin 1.5 (H) 0.3 - 0.9 mg/dL    Comment: Performed at [facility] [address], [place], [place] [address]  Glucose, capillary     Status: Abnormal   Collection Time: 10/14/20  8:06 AM  Result Value Ref Range   Glucose-Capillary 196 (H) 70 - 99 mg/dL    Comment: Glucose reference range applies only to samples taken after fasting for at least 8 hours.  Culture, Respiratory w Gram Stain     Status: None (Preliminary result)   Collection Time: 10/14/20  9:32 AM   Specimen: Tracheal Aspirate; Respiratory  Result Value Ref Range   Specimen Description TRACHEAL ASPIRATE    Special Requests Normal    Gram Stain      ABUNDANT WBC PRESENT, PREDOMINANTLY PMN ABUNDANT GRAM NEGATIVE COCCOBACILLI Performed at [facility], [place] [address], [place], [place] [address]    Culture PENDING    Report Status PENDING   I-STAT 7, (LYTES, BLD GAS, ICA, H+H)     Status: Abnormal   Collection Time: 10/14/20 11:21 AM  Result Value Ref Range   pH, Arterial 7.242 (L) 7.350 - 7.450   pCO2 arterial 37.7 32.0 - 48.0 mmHg   pO2, Arterial 296 (H) 83.0 - 108.0 mmHg   Bicarbonate 16.3 (L) 20.0 - 28.0 mmol/L   TCO2 17 (L) 22 - 32 mmol/L   O2 Saturation 100.0 %   Acid-base deficit 10.0 (H) 0.0 - 2.0 mmol/L   Sodium 149 (H) 135 - 145 mmol/L   Potassium 5.3 (H) 3.5 - 5.1 mmol/L   Calcium, Ion 1.35 1.15 - 1.40 mmol/L   HCT 29.0 (L) 39.0 - 52.0 %   Hemoglobin 9.9 (L) 13.0 - 17.0 g/dL    Patient temperature 97.7 F    Collection site Brachial    Drawn by RT    Sample type ARTERIAL   Glucose, capillary     Status: Abnormal   Collection Time: 10/14/20 11:22 AM  Result Value Ref Range   Glucose-Capillary 220 (H) 70 - 99 mg/dL    Comment: Glucose reference range applies only to samples taken after fasting for at least 8 hours.  Glucose, capillary     Status: Abnormal   Collection Time: 10/14/20  4:03 PM  Result Value Ref  Range   Glucose-Capillary >600 (HH) 70 - 99 mg/dL    Comment: Glucose reference range applies only to samples taken after fasting for at least 8 hours. QUESTIONABLE RESULTS - CHARGE CREDITED REPEATED TO VERIFY Performed at [facility], [place] [address] [place], [place] [address]   Glucose, capillary     Status: Abnormal   Collection Time: 10/14/20  4:05 PM  Result Value Ref Range   Glucose-Capillary 218 (H) 70 - 99 mg/dL    Comment: Glucose reference range applies only to samples taken after fasting for at least 8 hours.  Glucose, capillary     Status: Abnormal   Collection Time: 10/14/20  7:58 PM  Result Value Ref Range   Glucose-Capillary 176 (H) 70 - 99 mg/dL    Comment: Glucose reference range applies only to samples taken after fasting for at least 8 hours.  Glucose, capillary     Status: Abnormal   Collection Time: 10/14/20 11:44 PM  Result Value Ref Range   Glucose-Capillary 191 (H) 70 - 99 mg/dL    Comment: Glucose reference range applies only to samples taken after fasting for at least 8 hours.  Glucose, capillary     Status: Abnormal   Collection Time: 10/15/20  3:50 AM  Result Value Ref Range   Glucose-Capillary 254 (H) 70 - 99 mg/dL    Comment: Glucose reference range applies only to samples taken after fasting for at least 8 hours.  CBC     Status: Abnormal   Collection Time: 10/15/20  7:03 AM  Result Value Ref Range   WBC 15.4 (H) 4.0 - 10.5 K/uL   RBC 2.46 (L) 4.22 - 5.81 MIL/uL   Hemoglobin 7.0 (L) 13.0 - 17.0 g/dL     Comment: REPEATED TO VERIFY   HCT 26.0 (L) 39.0 - 52.0 %   MCV 105.7 (H) 80.0 - 100.0 fL   MCH 28.5 26.0 - 34.0 pg   MCHC 26.9 (L) 30.0 - 36.0 g/dL   RDW 21.7 (H) 11.5 - 15.5 %   Platelets 283 150 - 400 K/uL    Comment: REPEATED TO VERIFY   nRBC 0.3 (H) 0.0 - 0.2 %    Comment: Performed at [facility] [address], [place], [place] [address]  Glucose, capillary     Status: Abnormal   Collection Time: 10/15/20  8:37 AM  Result Value Ref Range   Glucose-Capillary 272 (H) 70 - 99 mg/dL    Comment: Glucose reference range applies only to samples taken after fasting for at least 8 hours.  Comprehensive metabolic panel     Status: Abnormal   Collection Time: 10/15/20  8:59 AM  Result Value Ref Range   Sodium 143 135 - 145 mmol/L   Potassium 5.0 3.5 - 5.1 mmol/L   Chloride 118 (H) 98 - 111 mmol/L   CO2 16 (L) 22 - 32 mmol/L   Glucose, Bld 308 (H) 70 - 99 mg/dL    Comment: Glucose reference range applies only to samples taken after fasting for at least 8 hours.   BUN 102 (H) 6 - 20 mg/dL   Creatinine, Ser 3.10 (H) 0.61 - 1.24 mg/dL   Calcium 8.5 (L) 8.9 - 10.3 mg/dL   Total Protein 7.5 6.5 - 8.1 g/dL   Albumin 1.4 (L) 3.5 - 5.0 g/dL   AST 87 (H) 15 - 41 U/L   ALT 87 (H) 0 - 44 U/L   Alkaline Phosphatase 91 38 - 126 U/L  Total Bilirubin 4.0 (H) 0.3 - 1.2 mg/dL   GFR, Estimated 24 (L) >60 mL/min    Comment: (NOTE) Calculated using the CKD-EPI Creatinine Equation (2021)    Anion gap 9 5 - 15    Comment: Performed at [facility] [address], [place], [place] [address]  Glucose, capillary     Status: Abnormal   Collection Time: 10/15/20 12:27 PM  Result Value Ref Range   Glucose-Capillary 230 (H) 70 - 99 mg/dL    Comment: Glucose reference range applies only to samples taken after fasting for at least 8 hours.  Prepare RBC (crossmatch)     Status: None   Collection Time: 10/15/20  1:37 PM  Result Value Ref Range   Order Confirmation      ORDER PROCESSED BY  BLOOD BANK Performed at [facility], [place] [address] [place], [place] [address]   Type and screen [place]     Status: None (Preliminary result)   Collection Time: 10/15/20  1:37 PM  Result Value Ref Range   ABO/RH(D) O NEG    Antibody Screen NEG    Sample Expiration 10/18/2020,2359    Unit Number [phone]    Blood Component Type RED CELLS,LR    Unit division 00    Status of Unit ISSUED    Transfusion Status OK TO TRANSFUSE    Crossmatch Result COMPATIBLE   I-STAT 7, (LYTES, BLD GAS, ICA, H+H)     Status: Abnormal   Collection Time: 10/15/20  1:53 PM  Result Value Ref Range   pH, Arterial 7.214 (L) 7.350 - 7.450   pCO2 arterial 33.4 32.0 - 48.0 mmHg   pO2, Arterial 91 83.0 - 108.0 mmHg   Bicarbonate 13.4 (L) 20.0 - 28.0 mmol/L   TCO2 14 (L) 22 - 32 mmol/L   O2 Saturation 95.0 %   Acid-base deficit 13.0 (H) 0.0 - 2.0 mmol/L   Sodium 146 (H) 135 - 145 mmol/L   Potassium 5.5 (H) 3.5 - 5.1 mmol/L   Calcium, Ion 1.27 1.15 - 1.40 mmol/L   HCT 23.0 (L) 39.0 - 52.0 %   Hemoglobin 7.8 (L) 13.0 - 17.0 g/dL   Patient temperature 99.6 F    Collection site Radial    Drawn by RT    Sample type ARTERIAL   Glucose, capillary     Status: Abnormal   Collection Time: 10/15/20  3:57 PM  Result Value Ref Range   Glucose-Capillary 233 (H) 70 - 99 mg/dL    Comment: Glucose reference range applies only to samples taken after fasting for at least 8 hours.  Basic metabolic panel     Status: Abnormal   Collection Time: 10/15/20  4:00 PM  Result Value Ref Range   Sodium 139 135 - 145 mmol/L    Comment: DELTA CHECK NOTED   Potassium 5.8 (H) 3.5 - 5.1 mmol/L   Chloride 119 (H) 98 - 111 mmol/L   CO2 12 (L) 22 - 32 mmol/L   Glucose, Bld 274 (H) 70 - 99 mg/dL    Comment: Glucose reference range applies only to samples taken after fasting for at least 8 hours.   BUN 115 (H) 6 - 20 mg/dL   Creatinine, Ser 3.54 (H) 0.61 - 1.24 mg/dL   Calcium 8.1 (L) 8.9 - 10.3 mg/dL    GFR, Estimated 20 (L) >60 mL/min    Comment: (NOTE) Calculated using the CKD-EPI Creatinine Equation (2021)    Anion gap 8  5 - 15    Comment: Performed at [facility], [place] [address] [place], [place] [address]  Phosphorus     Status: Abnormal   Collection Time: 10/15/20  4:00 PM  Result Value Ref Range   Phosphorus 7.5 (H) 2.5 - 4.6 mg/dL    Comment: Performed at [facility] [address], [place], [place] [address]   DG CHEST PORT 1 VIEW  Result Date: 10/14/2020 CLINICAL DATA:  Hypoxia EXAM: PORTABLE CHEST 1 VIEW COMPARISON:  October 14, 2020 study obtained earlier in the day FINDINGS: Endotracheal tube tip is 3.9 cm above the carina. Central catheter tip is in the superior vena cava. Pacemaker lead attached to right ventricle. No adenopathy. Lungs clear. Heart size and pulmonary vascular normal. No adenopathy. No bone lesions. IMPRESSION: Tube and catheter positions as described without pneumothorax. Lungs clear. Cardiac silhouette normal. Electronically Signed   By: Lowella Grip III M.D.   On: 10/14/2020 09:38   DG CHEST PORT 1 VIEW  Result Date: 10/14/2020 CLINICAL DATA:  Shortness of breath. EXAM: PORTABLE CHEST 1 VIEW COMPARISON:  10/11/2020. FINDINGS: Right PICC line stable position. Cardiac pacer stable position. Heart size normal. Mild left base atelectasis/infiltrate. No pleural effusion or pneumothorax. Degenerative changes scoliosis thoracic spine. IMPRESSION: 1. Right PICC line stable position. Cardiac pacer stable position. Heart size stable. 2.  Mild left base atelectasis/infiltrate. Electronically Signed   By: Marcello Moores  Register   On: 10/14/2020 08:31   CT CHEST ABDOMEN PELVIS WO CONTRAST  Result Date: 10/15/2020 CLINICAL DATA:  Fever, leukocytosis. EXAM: CT CHEST, ABDOMEN AND PELVIS WITHOUT CONTRAST TECHNIQUE: Multidetector CT imaging of the chest, abdomen and pelvis was performed following the standard protocol without IV contrast. COMPARISON:  August 21, 2020.  FINDINGS: CT CHEST FINDINGS Cardiovascular: No significant vascular findings. Normal heart size. No pericardial effusion. Mediastinum/Nodes: Endotracheal tube is in good position. Thyroid gland is unremarkable. Esophagus is unremarkable. No adenopathy is noted. Lungs/Pleura: No pneumothorax or pleural effusion is noted. Large right lower lobe airspace opacity is noted concerning for pneumonia or possibly atelectasis. Mild left lower lobe opacity is noted concerning for pneumonia or atelectasis. Musculoskeletal: No chest wall mass or suspicious bone lesions identified. CT ABDOMEN PELVIS FINDINGS Hepatobiliary: Status post cholecystectomy. No biliary dilatation is noted. Probable small subcapsular fluid collection is seen posteriorly and inferiorly along the right hepatic lobe. Pancreas: Status post surgical resection of pancreatic head and mass. There appears to be a stent or catheter within the expected position of pancreatic duct which is nondilated. This stent appears to be externalized. Swelling of the residual pancreas is noted which most likely is postoperative in etiology. Spleen: Normal in size without focal abnormality. Adrenals/Urinary Tract: Adrenal glands appear normal. Right renal atrophy is noted. No hydronephrosis or renal obstruction is noted. No renal or ureteral calculi are noted. Urinary bladder is unremarkable. Stomach/Bowel: Gastrostomy tube is seen well position within the distal gastric lumen. The patient appears to be status post right colectomy. At least 3 surgical drains are seen entering the right lower quadrant which extend to the right upper quadrant and porta hepatis region. No bowel dilatation is noted. The patient appears to have undergone significant small bowel resection is well. Vascular/Lymphatic: No significant vascular findings are present. No enlarged abdominal or pelvic lymph nodes. Reproductive: Prostate is unremarkable. Other: 5.4 x 5.2 cm fluid collection is noted in the  posterior portion of the pelvis in the pre rectal space concerning for abscess. Small amount of gas and  fluid is noted in the abdomen consistent with postoperative status. Large midline ventral surgical incision is noted. Musculoskeletal: No acute or significant osseous findings. IMPRESSION: Status post surgical resection of pancreatic head and mass. Patient also appears to be status post right colectomy and significant small bowel resection. 5.4 x 5.2 cm fluid collection is noted in the posterior portion of the pelvis in the pre rectal space concerning for abscess. Gastrostomy tube is in good position. At least 3 surgical drains are seen entering the right lower quadrant with tips directed toward the right upper quadrant and porta hepatis region. Also noted is apparent stent or catheter within the pancreatic duct which is externalized. Bilateral posterior basilar airspace opacities are noted, right much larger than left, concerning for pneumonia or possibly atelectasis. Electronically Signed   By: Marijo Conception M.D.   On: 10/15/2020 12:12    PMH:   Past Medical History:  Diagnosis Date  . AICD (automatic cardioverter/defibrillator) present   . Dyslipidemia   . Heart failure with mildly reduced EF    EF 35-40 in 2011 // Echo 06/2019: EF 45-50, Gr 2 DD, RAE, mild MR, mild TR, mild PI, RVSP 33.6  . History of kidney stones   . HTN (hypertension)   . Hx of ventricular fibrillation    Hx of VFib arrest s/p AICD placement  . Nonischemic cardiomyopathy     PSH:   Past Surgical History:  Procedure Laterality Date  . APPLICATION OF WOUND VAC N/A 09/09/2020   Procedure: APPLICATION OF WOUND VAC;  Surgeon: [name], MD;  Location: [facility];  Service: General;  Laterality: N/A;  . APPLICATION OF WOUND VAC N/A 09/28/2020   Procedure: APPLICATION OF WOUND VAC;  Surgeon: [name], MD;  Location: [facility];  Service: General;  Laterality: N/A;  . APPLICATION OF WOUND VAC  09/30/2020   Procedure:  APPLICATION OF WOUND VAC; PLACEMENT OF NEGATIVE PRESSURE DRESSING;  Surgeon: [name], MD;  Location: [facility];  Service: General;;  . BOWEL RESECTION  10/04/2020   Procedure: SMALL BOWEL RESECTION;  Surgeon: [name], MD;  Location: [facility];  Service: General;;  . BOWEL RESECTION N/A 09/12/2020   Procedure: SMALL BOWEL RESECTION;  Surgeon: [name], MD;  Location: [facility];  Service: General;  Laterality: N/A;  . CARDIAC DEFIBRILLATOR PLACEMENT    . CHOLECYSTECTOMY N/A 09/26/2020   Procedure: CHOLECYSTECTOMY;  Surgeon: [name], MD;  Location: [facility];  Service: General;  Laterality: N/A;  . COLECTOMY N/A 09/14/2020   Procedure: RIGHT TOTAL COLECTOMY;  Surgeon: [name], MD;  Location: [facility];  Service: General;  Laterality: N/A;  . COLOSTOMY  09/13/2020   Procedure: COLOSTOMY;  Surgeon: [name], MD;  Location: [facility];  Service: General;;  . ERCP N/A 05/27/2020   Procedure: ENDOSCOPIC RETROGRADE CHOLANGIOPANCREATOGRAPHY (ERCP);  Surgeon: Clarene Essex, MD;  Location: [place];  Service: Endoscopy;  Laterality: N/A;  . ESOPHAGOGASTRODUODENOSCOPY (EGD) WITH PROPOFOL N/A 06/25/2020   Procedure: ESOPHAGOGASTRODUODENOSCOPY (EGD) WITH PROPOFOL;  Surgeon: Arta Silence, MD;  Location: [facility];  Service: Endoscopy;  Laterality: N/A;  . FINE NEEDLE ASPIRATION N/A 06/25/2020   Procedure: FINE NEEDLE ASPIRATION (FNA) LINEAR;  Surgeon: Arta Silence, MD;  Location: [facility];  Service: Endoscopy;  Laterality: N/A;  . GASTROSTOMY N/A 09/24/2020   Procedure: INSERTION OF GASTROSTOMY TUBE;  Surgeon: [name], MD;  Location: [place];  Service: General;  Laterality: N/A;  . IR [place]  ADDITIONAL VESSEL  09/16/2020  . IR ANGIOGRAM SELECTIVE EACH ADDITIONAL VESSEL  09/11/2020  . IR ANGIOGRAM SELECTIVE EACH ADDITIONAL VESSEL  09/20/2020  . IR ANGIOGRAM VISCERAL SELECTIVE  09/30/2020  . IR PERC CHOLECYSTOSTOMY  08/27/2020  . LAPAROTOMY N/A 09/13/2020    Procedure: REOPENING OF LAPAROTOMY ABDOMINAL WASHOUT;  Surgeon: [name], MD;  Location: [place];  Service: General;  Laterality: N/A;  . LAPAROTOMY N/A 09/21/2020   Procedure: RE-EXPLORATORY LAPAROTOMY;  Surgeon: [name], MD;  Location: [place];  Service: General;  Laterality: N/A;  . LAPAROTOMY N/A 09/26/2020   Procedure: RE-EXPLORATORY LAPAROTOMY; ABDOMINAL WASHOUT;  Surgeon: [name], MD;  Location: [place];  Service: General;  Laterality: N/A;  . LAPAROTOMY N/A 10/03/2020   Procedure: RE -EXPLORATORY LAPAROTOMY AND [place];  Surgeon: [name], MD;  Location: [place];  Service: General;  Laterality: N/A;  . LAPAROTOMY N/A 09/16/2020   Procedure: RE-EXPLORATORY LAPAROTOMY;  Surgeon: [name], MD;  Location: [place];  Service: General;  Laterality: N/A;  . PACEMAKER IMPLANT    . PATCH ANGIOPLASTY N/A 09/10/2020   Procedure: PATCH ANGIOPLASTY SUPERIOR MESENTERIC VEIN;  Surgeon: Elam Dutch, MD;  [facility]: [facility] OR;  Service: Vascular;  Laterality: N/A;  . RADIOLOGY WITH ANESTHESIA N/A 09/28/2020   Procedure: RADIOLOGY WITH ANESTHESIA;  Surgeon: Radiologist, Medication, MD;  Location: [place];  Service: Radiology;  Laterality: N/A;  . REMOVAL OF STONES  05/27/2020   Procedure: REMOVAL OF STONES;  Surgeon: Clarene Essex, MD;  Location: [facility] ENDOSCOPY;  Service: Endoscopy;;  . repeat echo  2008    demonstrated near normalization  . SPHINCTEROTOMY  05/27/2020   Procedure: SPHINCTEROTOMY;  Surgeon: Clarene Essex, MD;  Location: [facility] ENDOSCOPY;  Service: Endoscopy;;  . TRANSTHORACIC ECHOCARDIOGRAM  11/09/2005, 01/15/2009, 02/07/2010  . UPPER ESOPHAGEAL ENDOSCOPIC ULTRASOUND (EUS) N/A 06/25/2020   Procedure: UPPER ESOPHAGEAL ENDOSCOPIC ULTRASOUND (EUS);  Surgeon: Arta Silence, MD;  Location: [facility];  Service: Endoscopy;  Laterality: N/A;  . WHIPPLE PROCEDURE N/A 10/09/2020   Procedure: WHIPPLE PROCEDURE;  Surgeon: [name], MD;  Location: [place];  Service: General;   Laterality: N/A;  ROOM 2 STARTING AT 07:30AM FOR 380 MIN    Allergies:  Allergies  Allergen Reactions  . Ace Inhibitors Cough    Medications:   Prior to Admission medications   Medication Sig Start Date End Date Taking? Authorizing Provider  acetaminophen (TYLENOL) 500 MG tablet Take 2 tablets (1,000 mg total) by mouth every 6 (six) hours. Patient taking differently: Take 1,000 mg by mouth every 6 (six) hours as needed for mild pain. 08/22/20  Yes [name], MD  allopurinol (ZYLOPRIM) 100 MG tablet Take one tablet by mouth daily for 1 week and then 2 tablets by mouth daily Patient taking differently: Take 200 mg by mouth daily. 07/03/20  Yes Swords, Darrick Penna, MD  amiodarone (PACERONE) 200 MG tablet Take 1 tablet (200 mg total) by mouth daily. Patient taking differently: Take 200 mg by mouth daily with supper. 07/30/20  Yes Evans Lance, MD  aspirin 81 MG tablet Take 1 tablet (81 mg total) by mouth 2 (two) times daily. Patient taking differently: Take 81 mg by mouth daily. 09/22/15  Yes Funches, Josalyn, MD  bisoprolol (ZEBETA) 10 MG tablet Take 1 tablet by mouth once daily Patient taking differently: Take 10 mg by mouth daily. 08/25/20  Yes Evans Lance, MD  budesonide-formoterol [facility]) 80-4.5 MCG/ACT inhaler Inhale 2 puffs into the lungs 2 (  two) times daily. Must keep upcoming office visit for refills Patient taking differently: Inhale 2 puffs into the lungs 2 (two) times daily as needed (for flares). 11/09/19  Yes Fulp, Cammie, MD  sacubitril-valsartan (ENTRESTO) 24-26 MG Take 1 tablet by mouth 2 (two) times daily. 06/25/20  Yes Evans Lance, MD  amiodarone (PACERONE) 200 MG tablet TAKE 1 TABLET (200 MG TOTAL) BY MOUTH DAILY. 07/30/20 07/30/21  Evans Lance, MD  amiodarone (PACERONE) 200 MG tablet TAKE 1 TABLET (200 MG TOTAL) BY MOUTH DAILY. 11/15/19 11/14/20  Evans Lance, MD  bisoprolol (ZEBETA) 10 MG tablet TAKE 1 TABLET (10 MG TOTAL) BY MOUTH DAILY. 11/15/19 11/14/20   Evans Lance, MD  oxyCODONE (OXY IR/ROXICODONE) 5 MG immediate release tablet Take 1-2 tablets (5-10 mg total) by mouth every 6 (six) hours as needed for severe pain. Patient not taking: Reported on 09/10/2020 08/22/20   [name], MD    Discontinued Meds:   Medications Discontinued During This Encounter  Medication Reason  . colchicine 0.6 MG tablet Expired Prescription  . bisoprolol (ZEBETA) tablet 10 mg   . heparin 6,000 Units in sodium chloride 0.9 % 500 mL irrigation Patient Discharge  . hemostatic agents Patient Discharge  . 0.9 % irrigation (POUR BTL) Patient Discharge  . lactated ringers infusion Patient Transfer  . meperidine (DEMEROL) injection 6.25-12.5 mg Patient Transfer  . amisulpride (BARHEMSYS) injection 10 mg Patient Transfer  . HYDROmorphone (DILAUDID) injection 0.25-0.5 mg Patient Transfer  . phenylephrine (NEOSYNEPHRINE) 10-0.9 MG/250ML-% infusion Patient Transfer  . norepinephrine (LEVOPHED) 75m in 25826mpremix infusion   . phenylephrine (NEOSYNEPHRINE) 10-0.9 MG/250ML-% infusion   . norepinephrine (LEVOPHED) 26m26mn 250m49memix infusion   . norepinephrine (LEVOPHED) 26mg 28m250mL 626mix infusion   . hemostatic agents Patient Discharge  . hemostatic agents Patient Discharge  . 0.9 % irrigation (POUR BTL) Patient Discharge  . metroNIDAZOLE (FLAGYL) IVPB 500 mg Patient Transfer  . amiodarone (PACERONE) tablet 200 mg   . naloxone (NARCAN) injection 0.4 mg   . sodium chloride flush (NS) 0.9 % injection 9 mL   . HYDROmorphone (DILAUDID) 1 mg/mL PCA injection   . diphenhydrAMINE (BENADRYL) injection 12.5 mg   . phenol (CHLORASEPTIC) mouth spray 1 spray   . hemostatic agents Patient Discharge  . 0.9 % irrigation (POUR BTL) Patient Discharge  . magnesium sulfate IVPB 1 g 100 mL   . phenylephrine CONCENTRATED 100mg i36mdium chloride 0.9% 250mL (081m/mL) 926musion   . gelatin adsorbable (GELFOAM/SURGIFOAM) 12-7 MM sponge 12-7 mm Returned to ADS  . 0.9 %  sodium  chloride infusion (Manually program via Guardrails IV Fluids) Expired Prescription  . 0.9 %  sodium chloride infusion (Manually program via Guardrails IV Fluids) Expired Prescription  . 0.9 %  sodium chloride infusion (Manually program via Guardrails IV Fluids) Expired Prescription  . 0.9 %  sodium chloride infusion (Manually program via Guardrails IV Fluids) Expired Prescription  . 0.9 %  sodium chloride infusion (Manually program via Guardrails IV Fluids) Expired Prescription  . 0.9 %  sodium chloride infusion (Manually program via Guardrails IV Fluids) Expired Prescription  . 0.9 %  sodium chloride infusion (Manually program via Guardrails IV Fluids) Expired Prescription  . pantoprazole (PROTONIX) injection 40 mg   . norepinephrine (LEVOPHED) 16 mg in 250mL prem37mnfusion   . polyethylene glycol (MIRALAX / GLYCOLAX) packet 17 g   . docusate (COLACE) 50 MG/5ML liquid 100 mg   . propofol (DIPRIVAN) 1000 MG/100ML infusion   .  TPN ADULT (ION)   . dextrose 5 %-0.45 % sodium chloride infusion   . cefTRIAXone (ROCEPHIN) 2 g in sodium chloride 0.9 % 100 mL IVPB   . metroNIDAZOLE (FLAGYL) IVPB 500 mg   . vancomycin (VANCOCIN) 2,250 mg in sodium chloride 0.9 % 500 mL IVPB   . 0.9 % irrigation (POUR BTL) Patient Discharge  . 0.9 % irrigation (POUR BTL) Patient Discharge  . acetaminophen (OFIRMEV) IV 1,000 mg   . acetaminophen (OFIRMEV) IV 1,000 mg   . vancomycin variable dose per unstable renal function (pharmacist dosing)   . magnesium sulfate IVPB 1 g 100 mL   . fentaNYL 2583mg in NS 2584m(1076mml) infusion-PREMIX   . dexmedetomidine (PRECEDEX) 400 MCG/100ML (4 mcg/mL) infusion   . vancomycin (VANCOREADY) IVPB 1750 mg/350 mL   . cefTRIAXone (ROCEPHIN) 2 g in sodium chloride 0.9 % 100 mL IVPB Change in therapy  . metroNIDAZOLE (FLAGYL) IVPB 500 mg Change in therapy  . vancomycin (VANCOREADY) IVPB 1000 mg/200 mL Discontinued by provider  . fluticasone furoate-vilanterol (BREO ELLIPTA) 100-25  MCG/INH 1 puff Discontinued by provider  . lactated ringers infusion Discontinued by provider  . enoxaparin (LOVENOX) injection 30 mg   . potassium & sodium phosphates (PHOS-NAK) 280-160-250 MG packet 1 packet Inpatient Standard  . potassium PHOSPHATE 30 mmol in dextrose 5 % 500 mL infusion Discontinued by provider  . fentaNYL (SUBLIMAZE) injection 50 mcg   . fentaNYL (SUBLIMAZE) injection 50-200 mcg   . HYDROmorphone (DILAUDID) 200 mg in sodium chloride 0.9 % 100 mL (2 mg/mL) infusion   . midazolam (VERSED) 50 mg/50 mL (1 mg/mL) premix infusion   . midazolam (VERSED) injection 2 mg   . hydrocortisone sodium succinate (SOLU-CORTEF) 100 MG injection 100 mg   . vasopressin (PITRESSIN) 20 Units in sodium chloride 0.9 % 100 mL infusion-*FOR SHOCK*   . EPINEPHrine (ADRENALIN) 4 mg in NS 250 mL (0.016 mg/mL) premix infusion   . docusate (COLACE) 50 MG/5ML liquid 100 mg   . polyethylene glycol (MIRALAX / GLYCOLAX) packet 17 g   . insulin aspart (novoLOG) injection 0-15 Units   . hydrocortisone sodium succinate (SOLU-CORTEF) 100 MG injection 50 mg   . fentaNYL (SUBLIMAZE) injection 50 mcg   . fentaNYL (SUBLIMAZE) injection 50-200 mcg   . dexmedetomidine (PRECEDEX) 400 MCG/100ML (4 mcg/mL) infusion Discontinued by provider  . hydrocortisone sodium succinate (SOLU-CORTEF) 100 MG injection 50 mg Discontinued by provider  . norepinephrine (LEVOPHED) 4mg3m 250mL66mmix infusion Discontinued by provider  . docusate (COLACE) 50 MG/5ML liquid 100 mg   . polyethylene glycol (MIRALAX / GLYCOLAX) packet 17 g   . potassium PHOSPHATE 45 mmol in dextrose 5 % 500 mL infusion   . potassium chloride 10 mEq in 50 mL *CENTRAL LINE* IVPB   . midazolam (VERSED) injection 2 mg   . potassium PHOSPHATE 20 mmol in dextrose 5 % 500 mL infusion   . potassium chloride 10 mEq in 50 mL *CENTRAL LINE* IVPB Duplicate  . EPINEPHrine (ADRENALIN) 4 mg in NS 250 mL (0.016 mg/mL) premix infusion   . fentaNYL (SUBLIMAZE) injection  50 mcg   . fentaNYL 2500mcg46mNS 250mL (70mg/m37mnfusion-PREMIX   . fentaNYL (SUBLIMAZE) injection 50 mcg   . midazolam (VERSED) injection 1 mg   . anidulafungin (ERAXIS) 100 mg in sodium chloride 0.9 % 100 mL IVPB   . piperacillin-tazobactam (ZOSYN) IVPB 3.375 g   . chlorhexidine gluconate (MEDLINE KIT) (PERIDEX) 0.12 % solution 15 mL   . MEDLINE mouth  rinse   . promethazine (PHENERGAN) 12.5 mg in sodium chloride 0.9 % 50 mL IVPB   . midazolam (VERSED) 2 MG/2ML injection Returned to ADS  . fentaNYL (SUBLIMAZE) 100 MCG/2ML injection Returned to ADS  . rocuronium bromide 100 MG/10ML SOSY Returned to ADS  . insulin aspart (novoLOG) injection 2 Units   . insulin aspart (novoLOG) injection 3 Units   . TPN ADULT (ION)   . dextrose 5 % solution   . insulin aspart (novoLOG) injection 2 Units   . docusate (COLACE) 50 MG/5ML liquid 100 mg   . polyethylene glycol (MIRALAX / GLYCOLAX) packet 17 g   . chlorhexidine (PERIDEX) 0.12 % solution 15 mL Duplicate  . MEDLINE mouth rinse Duplicate  . HYDROmorphone (DILAUDID) injection 0.5-1 mg   . fentaNYL (SUBLIMAZE) injection 50 mcg   . fentaNYL (SUBLIMAZE) injection 50-200 mcg   . furosemide (LASIX) injection 20 mg     Social History:  reports that he quit smoking about 22 years ago. His smoking use included cigarettes. He has a 0.50 pack-year smoking history. He has never used smokeless tobacco. He reports that he does not drink alcohol and does not use drugs.  Family History:   Family History  Problem Relation Age of Onset  . Diabetes Father   . Heart Problems Father        unsure of what kind  . Asthma Maternal Uncle   . Diabetes Maternal Grandmother     Blood pressure (!) 102/55, pulse 83, temperature (!) 97.4 F (36.3 C), temperature source Rectal, resp. rate (!) 22, height 6' 2"  (1.88 m), weight 94.4 kg, SpO2 100 %. General appearance: alert Head: Normocephalic, without obvious abnormality, atraumatic Eyes: negative Neck: no  adenopathy, no carotid bruit, supple, symmetrical, trachea midline and thyroid not enlarged, symmetric, no tenderness/mass/nodules Resp: clear to auscultation bilaterally and on ventilator Chest wall: no tenderness Cardio: regular rate and rhythm GI: no BS, soft Extremities: edema trace Pulses: 2+ and symmetric Skin: Skin color, texture, turgor normal. No rashes or lesions       [name], Hunt Oris, MD 10/15/2020, 7:19 PM

## 2020-10-15 NOTE — Progress Notes (Signed)
SLP Cancellation Note  Patient Details Name: [name] MRN: [id number] DOB: [birth year]   Cancelled treatment:        Pt re-intubated 4/5. Noted plans for trach placement 4/8. Will follow along for appropriateness for PMV at some point.    Houston Siren 10/15/2020, 3:25 PM  Orbie Pyo Colvin Caroli.Ed Risk analyst [phone] Office [phone]

## 2020-10-15 NOTE — Progress Notes (Signed)
PHARMACY - TOTAL PARENTERAL NUTRITION CONSULT NOTE  Indication:  Expected prolonged ileus  Patient Measurements: Height: 6\' 2"  (188 cm) Weight: 124.9 kg (275 lb 5.7 oz) IBW/kg (Calculated) : 82.2 TPN AdjBW (KG): 95.3 Body mass index is 35.35 kg/m.  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  Developed cholecystitis in Feb 2022 and had percutaneous cholecystectomy tube placement.  Presented this admission for neuroendocrine tumor resection with Whipple procedure, also had right total colectomy, colostomy and cholecystectomy on 10/01/2020. SMV was lacerated during procedure and repaired by bovine pericardial patch. Developed hemorrhagic shock with bile leak post-op and underwent placement of biliary T tube with abdominal washout on 3/15 PM. Abdomen remains open. Pharmacy consulted to manage TPN.  Glucose / Insulin: no hx DM, A1c 5.2%. CBGs 180s-190s on D5W @ 35 + TPN.  Utilized 39 units novolog in the past 24h hours. 50 units in TPN -scheduled novolog to 2 units q4h (hold for CBG<150). MD d/c'ed D5w infusion today  Electrolytes: Na normalized (no Na in TPN), Cl 122, HCO3 low (on max acetate) CoCa high at 10.4 (none in TPN), K up to 4.7, Mg 2.3, Phos 3.8  Renal: AKI - SCr stable at 2.24 (BL ~1-1.2), BUN stable at 78 Hepatic: LFTs mildly elevated, Tbili 3.9 (scleral yellowing still noted per RN 4/3) Albumin 1.5, prealbumin 25.1>13.3 (4.4) TG peaked at 740, now back down to 299 on 4/4 - will attempt to supplement EFA 100g/wk and monitor TG trends - will plan for Mon/Thurs supplementation. Next TG check on 4/7 AM. Intake / Output; MIVF: UOP 1.3 ml/kg/hr, drain (4 total): 9373 cc/24h, 0 cc from gastrostomy, net -8.3L GI Imaging: none since TPN start GI Surgeries / Procedures:  3/14 Whipple procedure with R hemicolectomy and end ileostomy, cholecystectomy 3/15 washout, VAC placement 3/17  washout, VAC replacement.  Necrosis of entire small bowel except for the PB limb and proximal 40-50cm of jejunum 3/19 re-opening laparotomy, abd closure.  Necrotic SB. 3/21 SB resection, take down of choledochojejunal anastomosis and pancreaticojejunal anastomosis, take down of duodenojejunal anastomosis, placement of externalized biliary drain / Stamm gastrostomy tube, IA washout and placement of intraperitoneal drains 3/30 extubation  Central access: R IJ CVC placed 3/15; changed to PICC placed 10/03/20 TPN start date: 09/16/2020  Nutritional Goals (RD rec on 3/30): 2800-3000 kCal, 185-210g AA, fluid >2L/day  Current Nutrition:  NPO - failed SLP eval 4/1 TPN  Plan:  - Continue TPN at 130 ml/hr. Will plan to decrease free water in TPN as Na continues to improve  - Planning intralipid 20% 250 cc/12h (~50g lipids) supplements 2x/wk for now to prevent EFAD - last 4/4, planning Mon/Thurs moving forward while monitoring TG trends - TPN with intralipid 2x/wk provides a daily average of 85g AA and 2983 kCal, meeting 100% of patient needs - Electrolytes in TPN: Decrease K to 5 mEq/L, Add Na 20 meQ/L/ Remove Ca, Continue Mg 3 mEq/L, Phos 12 mmol/L, max acetate - Daily multivitamin in TPN.  Remove standard trace elements and add back zinc 5mg , chromium 49mcg, selenium 80mcg  - Continue resistant SSI Q4H and continue 50 units regular insulin in TPN and add 2u regular insulin q6h - Standard TPN labs, TG on Mon/Thur  Trach planned for 4/8   Thank you for allowing pharmacy to be a part of this patient's care.  Albertina Parr, PharmD., BCPS, BCCCP Clinical Pharmacist Please  refer to [facility] for unit-specific pharmacist

## 2020-10-15 NOTE — Progress Notes (Signed)
NAMELEKEITH Solomon, MRN:  [id number], DOB:  1970/12/07, LOS: 34 ADMISSION DATE:  09/12/2020, CONSULTATION DATE:  10/15/20 REFERRING MD:  Anesthesia, CHIEF COMPLAINT:  Whipple, post-op shock   Brief History:  50 y.o. M with PMH of neuroendocrine tumor and underwent Whipple procedure 3/14 with intra-operative SMV laceration and repair by vascular with bovine pericardial patch.  Pt had worsening shock overnight and required three pressors despite massive transfusion protocol (received 150 blood products).  He was taken back to the OR for washout on 3/15 and returned to the ICU intubated.  Taken back 3/17 again found to have small bowel necrosis s/p resection.  Plans again to return 3/19 to OR.   Totality of small bowel has been resected. The case was discussed with [facility] who may consider eval for sm intestinal transplant pending clinical course.  Continuing aggressive supportive care.   Past Medical History:   has a past medical history of AICD (automatic cardioverter/defibrillator) present, Dyslipidemia, Heart failure with mildly reduced EF, History of kidney stones, HTN (hypertension), ventricular fibrillation, and Nonischemic cardiomyopathy.   Significant Hospital Events:  3/14: Admit to Surgery, to OR for whipple, SMV injury and repair, massive transfusion protocol overnight  3/15: Back to OR for washout, x2. IR for arteriogram. No major bleed source identified in OR, or arterial bleed w IR. Robust product resuscitation >75 products (+ TXA + novoseven) 3/16: off pressors, add'l 39 products overnight. Slowing resuscitation this morning with hemodynamic improvements and slowing drain output; OR x 2 - washout, biliary t tube, wound vac -- washout, wound vac, IR for arteriogram -- no arterial bleed for embolization 3/17:  Aggressive balanced transfusion continue overnight with marked hemodynamic improvement since yesterday evening. Off pressors x several hours, Wound vac output significantly  slowing (from 577ml q15-60min to 59ml q1hr+) and output is much thinner, Thin bloody secretions from mouth approx 28ml, Decreased RR from 22 to 18, Unable to tolerate NE- severe bradycardia 3/19 taken back to OR. Small bowel noted to be almost completely necrosed. Patient was closed with plans for goals of car discussion.  3/20 GOC family endorses full scope of treatment. Primary team discussed with [facility] the possibility of transfer for small bowel transplant. [facility] felt as though transfer would not change outcome. 3/21 Ongoing discussion with family and [facility]. Patient may be a candidate for transplant if he can stabilize post a small bowel resection. He was taken back to the OR and small bowel was resected.  3/23 weaning pressors. Versed off. Remains encephalopathic 3/24 off pressors. Low dose dilaudid gtt -- only weakly grimaces to pain. Changing sedation to precedex + PRN fentanyl. Long discussion with 2 brothers regarding clinical case -- tried to clarify that while the term "stable" has been used, in this instance is meaning that he has not declined from previous shift but is in fact still critically ill with multisystem organ dysfunction.  3/25 Cr and BUN increased. Off sedation  3/26 Pressors off, CT head benign, BUN/Cr continue to creep up 3/27 Tachycardia/HTN overnight improved with fentanyl gtt 2/28 PCCM sign off. Trauma to take over vent/CCM needs.  3/30 extubated 4/2 desaturated overnight to the 30s improved with BVM and NTS.  4/5 poor airway protection. PCCM consulted for re-intubation.   Consults:  PCCM VVS  IR  Palliative  Procedures:  PICC 3/25 > JP x 2  Biliary drain Pancreatic stent drain ETT 2/24>>3/13; 3/15 >> 3/29;  4/5 >   Significant Diagnostic Tests:  See radiology tab  Micro  Data:  See micro tab 4/5 trach asp >>  Antimicrobials:  Zosyn 3/21 > 3/29; 4/5 >> Eraxis 3/23> 3/29  Interim History / Subjective:  Febrile overnight up to 104 this am, on cooling  blanket Plans for CT chest/ abd/ pelvis today Starting fentanyl gtt due to frequent bolusing  Remain on minimal vent settings NE at 7 mcg/min Remains hyperglycemic  Pending CMET WBC 11.1-> 15.4, Hgb remains at/above 7  Objective   Blood pressure (!) 129/58, pulse 83, temperature (!) 96.7 F (35.9 C), temperature source Oral, resp. rate (!) 24, height 6\' 2"  (1.88 m), weight 124.9 kg, SpO2 100 %.    Vent Mode: PRVC FiO2 (%):  [40 %] 40 % Set Rate:  [24 bmp] 24 bmp Vt Set:  [650 mL] 650 mL PEEP:  [5 cmH20] 5 cmH20 Plateau Pressure:  [13 cmH20-28 cmH20] 13 cmH20   Intake/Output Summary (Last 24 hours) at 10/15/2020 0930 Last data filed at 10/15/2020 6712 Gross per 24 hour  Intake 4102.37 ml  Output 3650 ml  Net 452.37 ml   Filed Weights   09/20/2020 0639 10/01/20 1503  Weight: 95.3 kg 124.9 kg   Examination:  General:  Chronically ill appearing male lying in bed in NAD HEENT: MM pink/moist, ETT, pupils 4/reactive Neuro: opens eyes to voice, follows commands in all extremites, able to wiggle toes, squeeze hands, and give thumbs up.  Nods yes to pain but unable to point to area hurting CV: rr, NSR, no murmur PULM:  Non labored on MV, coarse throughout, some mod tan secretions GI: slightly distended, large midline abd dressing cdi, 2 JP drains w/ murky/turbid-  and bili drain on right, left pancreatic drain (clear) and gtube to drain- > minimal, condom cath in place Extremities: warm/dry Skin: no rashes   +722/ net -18.7L   Resolved Hospital Problem list   Hemorrhagic shock hypophosphatemia  Assessment & Plan:   Acute on chronic hypoxemic respiratory failure: due to poor airway protection in the setting of deconditioning and almost certain aspiration.  -Continue MV support, 4-8cc/kg IBW with goal Pplat <30 and DP<15  - remains on minimal vent settings  -VAP prevention protocol/ PPI -PAD protocol for sedation> changing to fentanyl gtt given frequent pushes, prn versed for RASS  goal 0/-1 -wean FiO2 as able for SpO2 >92%  -daily SAT & SBT- hold for today, going for CT - follow trach asp, placed empirically back on zosyn  - will discuss tracheostomy with attending  Neuroendocrine tumor of the pancreas. S/p  Whipple 4/58 complicated by hemorrhagic shock and DIC. Further complicated by small bowel necrosis s/p complete resection and external drainage of bile duct, pancreatic duct, and stomach.  - Management per surgery - CT chest/ abd/ pelvis today  - TPN per pharmacy - Ultimate goal is for small bowel transplant at [facility], however, he would need to be quite stable before he would be considered.  AKI: Creatinine remains stable at ~2.25. Electrolytes largely WNL - pending CMET  Hyperglycemia: - Per primary  Anemia of critical illness - follow CBC - Transfuse for hgb < 7.   Daily Goals Checklist  Pain/Anxiety/Delirium protocol (if indicated): PRN only for RASS goal 0 to -1 VAP protocol (if indicated): bundle in place.  DVT prophylaxis: Heparin Nutrition Status: TPN GI prophylaxis: Pantoprazole Urinary catheter: external Central lines: PICC 3/25 Glucose control: -SSI + TPN Mobility/therapy needs: Bedrest Restraints: Restraint type:NA Reason for restraints:NA Daily labs: CBC, CMP daily Code Status: Partial - -no CV/DF (has an ICD however)  Family Communication: per primary Disposition: ICU.   Goals of Care:  Last date of multidisciplinary goals of care discussion: 33/5 Family and staff present: Dr. Zenia Resides Summary of discussion: Family wishes for aggressive care.   Code Status:  Partial - no CV/DF by [place] (has an ICD which is not to be turned off)  CCT 30 mins    Kennieth Rad, ACNP [facility] 10/15/2020, 9:52 AM

## 2020-10-15 NOTE — Progress Notes (Signed)
ABG results given to Dr Bobbye Morton

## 2020-10-16 DIAGNOSIS — N179 Acute kidney failure, unspecified: Secondary | ICD-10-CM | POA: Diagnosis not present

## 2020-10-16 DIAGNOSIS — D3A8 Other benign neuroendocrine tumors: Secondary | ICD-10-CM | POA: Diagnosis not present

## 2020-10-16 DIAGNOSIS — T17908A Unspecified foreign body in respiratory tract, part unspecified causing other injury, initial encounter: Secondary | ICD-10-CM | POA: Diagnosis not present

## 2020-10-16 DIAGNOSIS — J9601 Acute respiratory failure with hypoxia: Secondary | ICD-10-CM | POA: Diagnosis not present

## 2020-10-16 LAB — COMPREHENSIVE METABOLIC PANEL
ALT: 76 U/L — ABNORMAL HIGH (ref 0–44)
AST: 80 U/L — ABNORMAL HIGH (ref 15–41)
Albumin: 1.2 g/dL — ABNORMAL LOW (ref 3.5–5.0)
Alkaline Phosphatase: 77 U/L (ref 38–126)
Anion gap: 12 (ref 5–15)
BUN: 120 mg/dL — ABNORMAL HIGH (ref 6–20)
CO2: 15 mmol/L — ABNORMAL LOW (ref 22–32)
Calcium: 7.7 mg/dL — ABNORMAL LOW (ref 8.9–10.3)
Chloride: 114 mmol/L — ABNORMAL HIGH (ref 98–111)
Creatinine, Ser: 4.04 mg/dL — ABNORMAL HIGH (ref 0.61–1.24)
GFR, Estimated: 17 mL/min — ABNORMAL LOW (ref >60)
Glucose, Bld: 183 mg/dL — ABNORMAL HIGH (ref 70–99)
Potassium: 4.4 mmol/L (ref 3.5–5.1)
Sodium: 141 mmol/L (ref 135–145)
Total Bilirubin: 3.9 mg/dL — ABNORMAL HIGH (ref 0.3–1.2)
Total Protein: 6.8 g/dL (ref 6.5–8.1)

## 2020-10-16 LAB — CBC
HCT: 23.7 % — ABNORMAL LOW (ref 39.0–52.0)
Hemoglobin: 7.3 g/dL — ABNORMAL LOW (ref 13.0–17.0)
MCH: 28.3 pg (ref 26.0–34.0)
MCHC: 30.8 g/dL (ref 30.0–36.0)
MCV: 91.9 fL (ref 80.0–100.0)
Platelets: 242 10*3/uL (ref 150–400)
RBC: 2.58 MIL/uL — ABNORMAL LOW (ref 4.22–5.81)
RDW: 20 % — ABNORMAL HIGH (ref 11.5–15.5)
WBC: 11 10*3/uL — ABNORMAL HIGH (ref 4.0–10.5)

## 2020-10-16 LAB — TRIGLYCERIDES: Triglycerides: 363 mg/dL — ABNORMAL HIGH (ref <150)

## 2020-10-16 LAB — GLUCOSE, CAPILLARY
Glucose-Capillary: 164 mg/dL — ABNORMAL HIGH (ref 70–99)
Glucose-Capillary: 158 mg/dL — ABNORMAL HIGH (ref 70–99)
Glucose-Capillary: 106 mg/dL — ABNORMAL HIGH (ref 70–99)
Glucose-Capillary: 101 mg/dL — ABNORMAL HIGH (ref 70–99)
Glucose-Capillary: 171 mg/dL — ABNORMAL HIGH (ref 70–99)
Glucose-Capillary: 203 mg/dL — ABNORMAL HIGH (ref 70–99)

## 2020-10-16 LAB — POCT I-STAT 7, (LYTES, BLD GAS, ICA,H+H)
Acid-base deficit: 11 mmol/L — ABNORMAL HIGH (ref 0.0–2.0)
Bicarbonate: 14.3 mmol/L — ABNORMAL LOW (ref 20.0–28.0)
Calcium, Ion: 1.16 mmol/L (ref 1.15–1.40)
HCT: 23 % — ABNORMAL LOW (ref 39.0–52.0)
Hemoglobin: 7.8 g/dL — ABNORMAL LOW (ref 13.0–17.0)
O2 Saturation: 95 %
Potassium: 4.3 mmol/L (ref 3.5–5.1)
Sodium: 145 mmol/L (ref 135–145)
TCO2: 15 mmol/L — ABNORMAL LOW (ref 22–32)
pCO2 arterial: 29.8 mmHg — ABNORMAL LOW (ref 32.0–48.0)
pH, Arterial: 7.289 — ABNORMAL LOW (ref 7.350–7.450)
pO2, Arterial: 85 mmHg (ref 83.0–108.0)

## 2020-10-16 LAB — MAGNESIUM: Magnesium: 2.3 mg/dL (ref 1.7–2.4)

## 2020-10-16 LAB — PHOSPHORUS: Phosphorus: 8 mg/dL — ABNORMAL HIGH (ref 2.5–4.6)

## 2020-10-16 MED ORDER — FAT EMULSION PLANT BASED 20% (INTRALIPID)IV EMUL
250.0000 mL | INTRAVENOUS | Status: AC
  Administered 2020-10-16: 250 mL via INTRAVENOUS
  Filled 2020-10-16: qty 250

## 2020-10-16 MED ORDER — ZINC CHLORIDE 1 MG/ML IV SOLN
INTRAVENOUS | Status: AC
  Filled 2020-10-16: qty 1238.4

## 2020-10-16 NOTE — Progress Notes (Signed)
17 Days Post-Op  Subjective: Hyperkalemic yesterday, responded to lasix challenge. Transfused 1u PRBCs, given 1L bolus with lasix. Levophed down to 3 this morning. Afebrile for last 24 hours. BUN/Cr continue to trend up. CBC pending. CT yesterday showed RLL pneumonia, as well as a pelvic fluid collection. BAL growing GNRs.   Objective: Vital signs in last 24 hours: Temp:  [97.4 F (36.3 C)-100.1 F (37.8 C)] 97.7 F (36.5 C) (04/07 0400) Pulse Rate:  [73-94] 74 (04/07 0645) Resp:  [20-29] 24 (04/07 0645) BP: (83-126)/(47-78) 105/66 (04/07 0645) SpO2:  [94 %-100 %] 100 % (04/07 0645) Arterial Line BP: (98-135)/(45-62) 115/52 (04/07 0645) FiO2 (%):  [40 %] 40 % (04/07 0332) Weight:  [94.4 kg] 94.4 kg (04/06 1503) Last BM Date:  (pta)  Intake/Output from previous day: 04/06 0701 - 04/07 0700 In: 5739.4 [I.V.:4260; Blood:300.8; IV Piggyback:1178.6] Out: 8119 [Urine:1900; Drains:1270] Intake/Output this shift: No intake/output data recorded.  PE: General: appears ill Resp: intubated, on vent CV: RRR Abdomen: soft, nondistended, midline incision open at skin, wound is clean and dry. JPx2 with turbid seropurulent fluid. Biliary drain with bile. LUQ pancreatic stent with clear colorless fluid. G tube in place to gravity LUQ.   Lab Results:  Recent Labs    10/15/20 0703 10/15/20 1353  WBC 15.4*  --   HGB 7.0* 7.8*  HCT 26.0* 23.0*  PLT 283  --    BMET Recent Labs    10/15/20 2214 10/16/20 0500  NA 143 141  K 4.8 4.4  CL 117* 114*  CO2 15* 15*  GLUCOSE 109* 183*  BUN 121* 120*  CREATININE 3.68* 4.04*  CALCIUM 8.0* 7.7*   PT/INR No results for input(s): LABPROT, INR in the last 72 hours. CMP     Component Value Date/Time   NA 141 10/16/2020 0500   NA 142 07/21/2020 0921   K 4.4 10/16/2020 0500   CL 114 (H) 10/16/2020 0500   CO2 15 (L) 10/16/2020 0500   GLUCOSE 183 (H) 10/16/2020 0500   BUN 120 (H) 10/16/2020 0500   BUN 16 07/21/2020 0921   CREATININE  4.04 (H) 10/16/2020 0500   CREATININE 1.16 03/01/2016 1607   CALCIUM 7.7 (L) 10/16/2020 0500   PROT 6.8 10/16/2020 0500   PROT 6.7 07/21/2020 0921   ALBUMIN 1.2 (L) 10/16/2020 0500   ALBUMIN 4.1 07/21/2020 0921   AST 80 (H) 10/16/2020 0500   ALT 76 (H) 10/16/2020 0500   ALKPHOS 77 10/16/2020 0500   BILITOT 3.9 (H) 10/16/2020 0500   BILITOT 0.8 07/21/2020 0921   GFRNONAA 17 (L) 10/16/2020 0500   GFRNONAA >89 09/22/2015 1008   GFRAA 82 07/21/2020 0921   GFRAA >89 09/22/2015 1008   Lipase     Component Value Date/Time   LIPASE 23 08/22/2020 0318       Studies/Results: DG CHEST PORT 1 VIEW  Result Date: 10/14/2020 CLINICAL DATA:  Hypoxia EXAM: PORTABLE CHEST 1 VIEW COMPARISON:  October 14, 2020 study obtained earlier in the day FINDINGS: Endotracheal tube tip is 3.9 cm above the carina. Central catheter tip is in the superior vena cava. Pacemaker lead attached to right ventricle. No adenopathy. Lungs clear. Heart size and pulmonary vascular normal. No adenopathy. No bone lesions. IMPRESSION: Tube and catheter positions as described without pneumothorax. Lungs clear. Cardiac silhouette normal. Electronically Signed   By: Lowella Grip III M.D.   On: 10/14/2020 09:38   DG CHEST PORT 1 VIEW  Result Date: 10/14/2020 CLINICAL DATA:  Shortness of breath. EXAM: PORTABLE CHEST 1 VIEW COMPARISON:  10/11/2020. FINDINGS: Right PICC line stable position. Cardiac pacer stable position. Heart size normal. Mild left base atelectasis/infiltrate. No pleural effusion or pneumothorax. Degenerative changes scoliosis thoracic spine. IMPRESSION: 1. Right PICC line stable position. Cardiac pacer stable position. Heart size stable. 2.  Mild left base atelectasis/infiltrate. Electronically Signed   By: Marcello Moores  Register   On: 10/14/2020 08:31   CT CHEST ABDOMEN PELVIS WO CONTRAST  Result Date: 10/15/2020 CLINICAL DATA:  Fever, leukocytosis. EXAM: CT CHEST, ABDOMEN AND PELVIS WITHOUT CONTRAST TECHNIQUE:  Multidetector CT imaging of the chest, abdomen and pelvis was performed following the standard protocol without IV contrast. COMPARISON:  August 21, 2020. FINDINGS: CT CHEST FINDINGS Cardiovascular: No significant vascular findings. Normal heart size. No pericardial effusion. Mediastinum/Nodes: Endotracheal tube is in good position. Thyroid gland is unremarkable. Esophagus is unremarkable. No adenopathy is noted. Lungs/Pleura: No pneumothorax or pleural effusion is noted. Large right lower lobe airspace opacity is noted concerning for pneumonia or possibly atelectasis. Mild left lower lobe opacity is noted concerning for pneumonia or atelectasis. Musculoskeletal: No chest wall mass or suspicious bone lesions identified. CT ABDOMEN PELVIS FINDINGS Hepatobiliary: Status post cholecystectomy. No biliary dilatation is noted. Probable small subcapsular fluid collection is seen posteriorly and inferiorly along the right hepatic lobe. Pancreas: Status post surgical resection of pancreatic head and mass. There appears to be a stent or catheter within the expected position of pancreatic duct which is nondilated. This stent appears to be externalized. Swelling of the residual pancreas is noted which most likely is postoperative in etiology. Spleen: Normal in size without focal abnormality. Adrenals/Urinary Tract: Adrenal glands appear normal. Right renal atrophy is noted. No hydronephrosis or renal obstruction is noted. No renal or ureteral calculi are noted. Urinary bladder is unremarkable. Stomach/Bowel: Gastrostomy tube is seen well position within the distal gastric lumen. The patient appears to be status post right colectomy. At least 3 surgical drains are seen entering the right lower quadrant which extend to the right upper quadrant and porta hepatis region. No bowel dilatation is noted. The patient appears to have undergone significant small bowel resection is well. Vascular/Lymphatic: No significant vascular  findings are present. No enlarged abdominal or pelvic lymph nodes. Reproductive: Prostate is unremarkable. Other: 5.4 x 5.2 cm fluid collection is noted in the posterior portion of the pelvis in the pre rectal space concerning for abscess. Small amount of gas and fluid is noted in the abdomen consistent with postoperative status. Large midline ventral surgical incision is noted. Musculoskeletal: No acute or significant osseous findings. IMPRESSION: Status post surgical resection of pancreatic head and mass. Patient also appears to be status post right colectomy and significant small bowel resection. 5.4 x 5.2 cm fluid collection is noted in the posterior portion of the pelvis in the pre rectal space concerning for abscess. Gastrostomy tube is in good position. At least 3 surgical drains are seen entering the right lower quadrant with tips directed toward the right upper quadrant and porta hepatis region. Also noted is apparent stent or catheter within the pancreatic duct which is externalized. Bilateral posterior basilar airspace opacities are noted, right much larger than left, concerning for pneumonia or possibly atelectasis. Electronically Signed   By: Marijo Conception M.D.   On: 10/15/2020 12:12    Assessment/Plan 50 yo male 3 weeks s/p Whipple, right colectomy and SMV repair for T3N0 neuroendocrine tumor. C/b postop hemorrhagic shock with DIC and subsequent diffuse small bowel necrosis, now  s/p small bowel resection with external drainage of bile duct, pancreatic duct and stomach. - Patient is septic, most likely secondary to aspiration pneumonia. Continue Zosyn. F/u speciation of BAL cultures. - Pelvic fluid collection: Will ask IR to evaluate if this is drainable. Abdomen otherwise appears well-drained on imaging. - TPN paused due to hyperkalemia, discuss with pharmacy if potassium can be further reduced for next bag tonight. - Scheduled for trach tomorrow with Dr. Mayo Ao - VTE: SQH - Dispo: ICU. I  think overall prognosis at this point given sepsis with multisystem organ dysfunction in the setting of complete small resection is very poor. I will have ongoing discussions with family but they have indicated clearly in previous conversations that they would like full care.   LOS: 24 days    Michaelle Birks, MD [facility] Surgery General, Hepatobiliary and Pancreatic Surgery 10/16/20 7:25 AM

## 2020-10-16 NOTE — Progress Notes (Addendum)
[facility] Progress Note    Assessment/ Plan:   Assessment/Plan: 1. Acute renal failure - he has reasons to be in ATN  With hypotensive episodes requiring Levophed in the setting of a very complicated surgical history with multiple visits to the OR + DIC + diffuse small bowel necrosis. Creatinine fluctuating represents compromised renal function with unfortunately repeated insults. No evidence of obstruction and UOP may be starting to decrease  over the past 24hrs; continued purulent drainage from JP drain in abdomen. According to charting he is neg 9848 mL during this hospitalization. - RRT is a possibility especially with the family wanting aggressive medical treatment; but I would favor this only as a bridge to small bowel transplant with a defined time interval of RRT to determine whether there is any improvement in overall status.  For now, making adequate urine and no hard indication for RRT at present.  Continue to follow. - Avoid nephrotoxins and dose medications for a GFR < 15 ml/min.  2. Respiratory failure reintubated now on the ventilator--> some concern for pneumonia 3. Hyperkalemia: resolved, TPN being reformulated 4. Neuroendocrine tumor s/p Whipple with unfortunate extensive small bowel necrosis s/p resection.  Family is hopeful for a small bowel transplant, however this is quite a long ways off d/t multiple issues 5. Severe malnutrition on TPN. 6. Anemia   Subjective:    Seen in room.  Awake and alert on the vent.     Objective:   BP 114/72   Pulse 82   Temp 98.2 F (36.8 C) (Rectal)   Resp (!) 24   Ht 6' 2"  (1.88 m)   Wt 94.4 kg   SpO2 100%   BMI 26.72 kg/m   Intake/Output Summary (Last 24 hours) at 10/16/2020 1437 Last data filed at 10/16/2020 1337 Gross per 24 hour  Intake 6519.94 ml  Output 4375 ml  Net 2144.94 ml   Weight change:   Physical Exam: Gen: ill-appearing on the vent CVS: tachycardic Resp: coarse mechanical Abd: distended,  pancreatic drain in place, biliary drain, 2 JPs and a drain from the colostomy stoma Ext: 1+ anasarca  Imaging: CT CHEST ABDOMEN PELVIS WO CONTRAST  Result Date: 10/15/2020 CLINICAL DATA:  Fever, leukocytosis. EXAM: CT CHEST, ABDOMEN AND PELVIS WITHOUT CONTRAST TECHNIQUE: Multidetector CT imaging of the chest, abdomen and pelvis was performed following the standard protocol without IV contrast. COMPARISON:  August 21, 2020. FINDINGS: CT CHEST FINDINGS Cardiovascular: No significant vascular findings. Normal heart size. No pericardial effusion. Mediastinum/Nodes: Endotracheal tube is in good position. Thyroid gland is unremarkable. Esophagus is unremarkable. No adenopathy is noted. Lungs/Pleura: No pneumothorax or pleural effusion is noted. Large right lower lobe airspace opacity is noted concerning for pneumonia or possibly atelectasis. Mild left lower lobe opacity is noted concerning for pneumonia or atelectasis. Musculoskeletal: No chest wall mass or suspicious bone lesions identified. CT ABDOMEN PELVIS FINDINGS Hepatobiliary: Status post cholecystectomy. No biliary dilatation is noted. Probable small subcapsular fluid collection is seen posteriorly and inferiorly along the right hepatic lobe. Pancreas: Status post surgical resection of pancreatic head and mass. There appears to be a stent or catheter within the expected position of pancreatic duct which is nondilated. This stent appears to be externalized. Swelling of the residual pancreas is noted which most likely is postoperative in etiology. Spleen: Normal in size without focal abnormality. Adrenals/Urinary Tract: Adrenal glands appear normal. Right renal atrophy is noted. No hydronephrosis or renal obstruction is noted. No renal or ureteral calculi are noted. Urinary bladder  is unremarkable. Stomach/Bowel: Gastrostomy tube is seen well position within the distal gastric lumen. The patient appears to be status post right colectomy. At least 3 surgical  drains are seen entering the right lower quadrant which extend to the right upper quadrant and porta hepatis region. No bowel dilatation is noted. The patient appears to have undergone significant small bowel resection is well. Vascular/Lymphatic: No significant vascular findings are present. No enlarged abdominal or pelvic lymph nodes. Reproductive: Prostate is unremarkable. Other: 5.4 x 5.2 cm fluid collection is noted in the posterior portion of the pelvis in the pre rectal space concerning for abscess. Small amount of gas and fluid is noted in the abdomen consistent with postoperative status. Large midline ventral surgical incision is noted. Musculoskeletal: No acute or significant osseous findings. IMPRESSION: Status post surgical resection of pancreatic head and mass. Patient also appears to be status post right colectomy and significant small bowel resection. 5.4 x 5.2 cm fluid collection is noted in the posterior portion of the pelvis in the pre rectal space concerning for abscess. Gastrostomy tube is in good position. At least 3 surgical drains are seen entering the right lower quadrant with tips directed toward the right upper quadrant and porta hepatis region. Also noted is apparent stent or catheter within the pancreatic duct which is externalized. Bilateral posterior basilar airspace opacities are noted, right much larger than left, concerning for pneumonia or possibly atelectasis. Electronically Signed   By: Marijo Conception M.D.   On: 10/15/2020 12:12    Labs: BMET Recent Labs  Lab 10/12/20 0500 10/13/20 0519 10/14/20 0223 10/14/20 1121 10/15/20 0859 10/15/20 1353 10/15/20 1600 10/15/20 2214 10/16/20 0500 10/16/20 0853  NA 149* 148* 145 149* 143 146* 139 143 141 145  K 3.8 4.0 4.7 5.3* 5.0 5.5* 5.8* 4.8 4.4 4.3  CL 126* 123* 122*  --  118*  --  119* 117* 114*  --   CO2 18* 17* 15*  --  16*  --  12* 15* 15*  --   GLUCOSE 170* 184* 215*  --  308*  --  274* 109* 183*  --   BUN 79*  79* 78*  --  102*  --  115* 121* 120*  --   CREATININE 2.23* 2.22* 2.24*  --  3.10*  --  3.54* 3.68* 4.04*  --   CALCIUM 8.6* 8.7* 8.7*  --  8.5*  --  8.1* 8.0* 7.7*  --   PHOS 3.4 3.8  --   --   --   --  7.5*  --  8.0*  --    CBC Recent Labs  Lab 10/11/20 0459 10/13/20 0519 10/14/20 1121 10/15/20 0703 10/15/20 1353 10/16/20 0500 10/16/20 0853  WBC 10.7* 11.1*  --  15.4*  --  11.0*  --   NEUTROABS  --  8.9*  --   --   --   --   --   HGB 8.2* 7.7*   < > 7.0* 7.8* 7.3* 7.8*  HCT 26.6* 25.7*   < > 26.0* 23.0* 23.7* 23.0*  MCV 92.7 93.1  --  105.7*  --  91.9  --   PLT 221 237  --  283  --  242  --    < > = values in this interval not displayed.    Medications:    . sodium chloride   Intravenous Once  . chlorhexidine gluconate (MEDLINE KIT)  15 mL Mouth Rinse BID  . Chlorhexidine Gluconate Cloth  6  each Topical Daily  . insulin aspart  0-20 Units Subcutaneous Q4H  . insulin aspart  2 Units Subcutaneous Q4H  . ipratropium-albuterol  3 mL Nebulization Q6H  . mouth rinse  15 mL Mouth Rinse 10 times per day  . pantoprazole (PROTONIX) IV  40 mg Intravenous BID  . sodium chloride flush  10-40 mL Intracatheter Q12H      Madelon Lips, MD 10/16/2020, 2:37 PM

## 2020-10-16 NOTE — Progress Notes (Signed)
Interventional Radiology Brief Note:  [name] is a 50 year old male with history of neuroendocrine tumor s/p Whipple procedure 1/73/56 complicated by hemorrhagic shock and bile leak.  He returned to the OR 09/23/2020 with IR assistance for multi-selective visceral angiogram, however there was no evidence of arterial bleeding. His abdomen was left open and upon returning to the OR 3/17 was found to have diffuse necrosis of the small bowel requiring resection. Ultimately on 10/07/2020 he underwent further entire small bowel resection due to ischemia in hopes of a potential small bowel transplant in the future. Patient has remained critical with tenuous status since this time with sepsis, multisystem organ dysfunction and ongoing respiratory failure requiring intubation. CT Abdomen Pelvis obtained 4/6 due to fever and leukocytosis.  Imaging shows a fluid collection in the posterior pelvis.  IR consulted for aspiration and drainage.   Imaging reviewed by Dr. Pascal Lux who has discussed the case with Dr. Zenia Resides.  The fluid collection is approachable for a transgluteal drain placement, however would require challenging positioning for patient with midline wound and on vent.  He is currently clinically unchanged.  Decision made to hold on drain placement for now.  Surgery will re-consult if needed.   Brynda Greathouse, MS RD PA-C 11:17 AM

## 2020-10-16 NOTE — Progress Notes (Signed)
PHARMACY - TOTAL PARENTERAL NUTRITION CONSULT NOTE  Indication:  Expected prolonged ileus  Patient Measurements: Height: 6\' 2"  (188 cm) Weight: 124.9 kg (275 lb 5.7 oz) IBW/kg (Calculated) : 82.2 TPN AdjBW (KG): 95.3 Body mass index is 35.35 kg/m.  Assessment:  27 YOM with history of mass adjacent to head of the pancreas in 2021. 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  Developed cholecystitis in Feb 2022 and had percutaneous cholecystectomy tube placement.  Presented this admission for neuroendocrine tumor resection with Whipple procedure, also had right total colectomy, colostomy and cholecystectomy on 10/02/2020. SMV was lacerated during procedure and repaired by bovine pericardial patch. Developed hemorrhagic shock with bile leak post-op and underwent placement of biliary T tube with abdominal washout on 3/15 PM. Abdomen remains open. Pharmacy consulted to manage TPN.  Glucose / Insulin: no hx DM, A1c 5.2%. CBGs 150s-230s on TPN (held yesterday due to hyperK).  Utilized 39 units novolog in the past 24h hours. 50 units in TPN -scheduled novolog to 2 units q4h (hold for CBG<150).  Electrolytes: Na down to 141, Cl 122, HCO3 low (on max acetate) iCa 1.16 (none in TPN), K peaked at 5.8 requiring treatment, K 4.4 this AM, Mg 2.3, Phos up to 8  Renal: AKI - SCr up to 4.04 (BL ~1-1.2), BUN up to 120 Hepatic: LFTs mildly elevated, Tbili 3.9 (scleral yellowing still noted per RN 4/3) Albumin 1.2, prealbumin 25.1>13.3 (4.4) TG peaked at 740, now back up to 363 on 4/7 - will attempt to supplement EFA 100g/wk and monitor TG trends - will plan for Mon/Thurs supplementation. Next TG check on 4/7 AM. Intake / Output; MIVF: UOP 0.8 ml/kg/hr, drain (4 total): 1220 cc/24h, 50 cc from gastrostomy, +2.5L yest  GI Imaging: none since TPN start GI Surgeries / Procedures:  3/14 Whipple procedure with R hemicolectomy and end ileostomy, cholecystectomy 3/15 washout, VAC placement 3/17  washout, VAC replacement.  Necrosis of entire small bowel except for the PB limb and proximal 40-50cm of jejunum 3/19 re-opening laparotomy, abd closure.  Necrotic SB. 3/21 SB resection, take down of choledochojejunal anastomosis and pancreaticojejunal anastomosis, take down of duodenojejunal anastomosis, placement of externalized biliary drain / Stamm gastrostomy tube, IA washout and placement of intraperitoneal drains 3/30 extubation  Central access: R IJ CVC placed 3/15; changed to PICC placed 10/03/20 TPN start date: 09/09/2020  Nutritional Goals (RD rec on 3/30): 2800-3000 kCal, 185-210g AA, fluid >2L/day  Current Nutrition:  NPO - failed SLP eval 4/1 TPN  Plan:  - Concentrate TPN back to 90 ml/hr since Na has normalized  - Planning intralipid 20% 250 cc/12h (~50g lipids) supplements 2x/wk for now to prevent EFAD - last 4/4, planning Mon/Thurs moving forward while monitoring TG trends - TPN with intralipid 2x/wk provides a daily average of 85g AA and 2983 kCal, meeting 100% of patient needs.  - Electrolytes in TPN: Remove K from TPN, Increase Na to 30 meQ/L, Remove Ca & Phos, Continue Mg 3 mEq/L, max acetate - Daily multivitamin in TPN.  Remove standard trace elements and add back zinc 5mg , chromium 65mcg, selenium 60mcg  - Continue resistant SSI Q4H and continue 50 units regular insulin in TPN and add 2u regular insulin q6h - Standard TPN labs, TG on Mon/Thur  Trach planned for 4/8   Thank you for allowing pharmacy to be a part of this patient's care.  Albertina Parr, PharmD., BCPS, BCCCP Clinical Pharmacist Please refer to [facility] for unit-specific pharmacist

## 2020-10-16 NOTE — Progress Notes (Signed)
[name], MRN:  [id number], DOB:  02-18-1971, LOS: 24 ADMISSION DATE:  09/20/2020, CONSULTATION DATE:  10/16/20 REFERRING MD:  Anesthesia, CHIEF COMPLAINT:  Whipple, post-op shock   Brief History:  50 y.o. M with PMH of neuroendocrine tumor and underwent Whipple procedure 3/14 with intra-operative SMV laceration and repair by vascular with bovine pericardial patch.  Pt had worsening shock overnight and required three pressors despite massive transfusion protocol (received 150 blood products).  He was taken back to the OR for washout on 3/15 and returned to the ICU intubated.  Taken back 3/17 again found to have small bowel necrosis s/p resection.  Plans again to return 3/19 to OR.   Totality of small bowel has been resected. The case was discussed with [facility] who may consider eval for sm intestinal transplant pending clinical course.  Continuing aggressive supportive care.   Past Medical History:   has a past medical history of AICD (automatic cardioverter/defibrillator) present, Dyslipidemia, Heart failure with mildly reduced EF, History of kidney stones, HTN (hypertension), ventricular fibrillation, and Nonischemic cardiomyopathy.  Significant Hospital Events:  3/14: Admit to Surgery, to OR for whipple, SMV injury and repair, massive transfusion protocol overnight  3/15: Back to OR for washout, x2. IR for arteriogram. No major bleed source identified in OR, or arterial bleed w IR. Robust product resuscitation >75 products (+ TXA + novoseven) 3/16: off pressors, add'l 39 products overnight. Slowing resuscitation this morning with hemodynamic improvements and slowing drain output; OR x 2 - washout, biliary t tube, wound vac -- washout, wound vac, IR for arteriogram -- no arterial bleed for embolization 3/17:  Aggressive balanced transfusion continue overnight with marked hemodynamic improvement since yesterday evening. Off pressors x several hours, Wound vac output significantly slowing  (from 532ml q15-61min to 565ml q1hr+) and output is much thinner, Thin bloody secretions from mouth approx 245ml, Decreased RR from 22 to 18, Unable to tolerate NE- severe bradycardia 3/19 taken back to OR. Small bowel noted to be almost completely necrosed. Patient was closed with plans for goals of car discussion.  3/20 GOC family endorses full scope of treatment. Primary team discussed with [facility] the possibility of transfer for small bowel transplant. [facility] felt as though transfer would not change outcome. 3/21 Ongoing discussion with family and [facility]. Patient may be a candidate for transplant if he can stabilize post a small bowel resection. He was taken back to the OR and small bowel was resected.  3/23 weaning pressors. Versed off. Remains encephalopathic 3/24 off pressors. Low dose dilaudid gtt -- only weakly grimaces to pain. Changing sedation to precedex + PRN fentanyl. Long discussion with 2 brothers regarding clinical case -- tried to clarify that while the term "stable" has been used, in this instance is meaning that he has not declined from previous shift but is in fact still critically ill with multisystem organ dysfunction.  3/25 Cr and BUN increased. Off sedation  3/26 Pressors off, CT head benign, BUN/Cr continue to creep up 3/27 Tachycardia/HTN overnight improved with fentanyl gtt 2/28 PCCM sign off. Trauma to take over vent/CCM needs.  3/30 extubated 4/2 desaturated overnight to the 30s improved with BVM and NTS.  4/5 poor airway protection. PCCM consulted for re-intubation.  4/6 CT a/p Large RLL opacity, smaller LLL opacity. Post op changes. 5.4cm fluid collection in posterior pelvis, concerning for abscess. Started on Zosyn  4/7 got transfused overnight and then diuresed. Worse renal function  Consults:  PCCM VVS  IR  Palliative  Procedures:  PICC 3/25 > JP x 2  Biliary drain Pancreatic stent drain ETT 2/24>>3/13; 3/15 >> 3/29;  4/5 >   Significant Diagnostic Tests:   See Radiology tab   Most recent significant exam was CT a/p 4/6: which revealed R>L pulmonary opacities, and pelvic fluid collection concerning for abscess as well as detailing the numerous post-op changes.   Micro Data:  See micro tab 4/5 trach asp >> Gram neg coccobacilli   Antimicrobials:  Zosyn 3/21 > 3/29; 4/5 >> Eraxis 3/23> 3/29  Interim History / Subjective:  Got 1 PRBC Remains on levo -- down 5 to 3 On fent gtt  Cr continuing to uptrend, now at 4.04 and BUN 120. Trigs 363. WBC 11  Off his tpn due to hyperkalemia.   An ABG was acquired this morning --metabolic acidosis   Objective   Blood pressure 105/66, pulse 74, temperature 97.7 F (36.5 C), temperature source Rectal, resp. rate (!) 24, height 6\' 2"  (1.88 m), weight 94.4 kg, SpO2 100 %.    Vent Mode: PRVC FiO2 (%):  [40 %] 40 % Set Rate:  [24 bmp] 24 bmp Vt Set:  [650 mL] 650 mL PEEP:  [5 cmH20] 5 cmH20 Plateau Pressure:  [13 cmH20-29 cmH20] 29 cmH20   Intake/Output Summary (Last 24 hours) at 10/16/2020 0704 Last data filed at 10/16/2020 0600 Gross per 24 hour  Intake 5739.4 ml  Output 3170 ml  Net 2569.4 ml   Filed Weights   09/14/2020 0639 10/01/20 1503 10/15/20 1503  Weight: 95.3 kg 124.9 kg 94.4 kg   Examination:  General:  Chronically and critically ill appearing middle aged M, intubated lightly sedated NAD  HEENT: [place]. ETT secure. Mild scleral icterus  Neuro: Awakens to voice, follows commands. Tremor BUE L>R. Weak.  CV: rr with some PVCs. Cap refill < 3 seconds. s1s2  PULM:  Symmetrical chest expansion, bilateral rhonchi, diminished bibasilar sounds, moderate thick tan secretions GI: Round, mild tenderness localized to surgical site. Multiple drains-- JPs with turbid fluid.  Extremities: Non pitting edema. No cyanosis or clubbing  Skin: c/d/w no rash   Resolved Hospital Problem list   Hemorrhagic shock hypophosphatemia  Assessment & Plan:   Acute on chronic hypoxemic respiratory failure  requiring reintubation -reintubated 4/5 Suspected aspiration PNA  P -zosyn -VAP/PAD  -Trauma to do trach in OR 4/8   Pancreatic neuroendocrine tumor s/p whipple 3/21 b/c hemorrhagic shock, c/b small bowel necrosis now s/p total small bowel resection and externa; drainage for bile duct pancreatic duct and stomach.  - per CCS -TPN on hold with hyperkalemia  - Family's continued hope is for transfer to [facility] for small bowel transplant. Suspect pt has quite a ways to go before stable enough to be considered, and am unsure if underlying conditions like NICM would preclude candidacy. Defer these conversations to CCS   Shock, suspect septic in setting of PNA Pelvic fluid collection, possible abscess P -NE for MAP > 65 -zosyn -CCS to consult IR to eval if fluid collection is amenable to drainage    AKI, worse  Cr up to 4 and BUN 120 (4/7) P -nephro following  -supportive care.   NAGMA -suspect this is from renal failure above   Transaminitis, mild -trend PRN   Hyperglycemia - rSSI   Anemia of critical illness  - follow CBC - Transfuse for hgb < 7.   NICM s/p ICD -if shock doesn't improve w abx, could consider echo   [place] Suspect poor prognosis.  The  family hopes for a small intestine transplant -- I think there are currently several acute barriers before the pt could even possibly be considered for potential candidacy -He has required reintubation, he has PNA, possible pelvic abscess, shock, he is severely debilitated, his renal function is worse (temporarily off TPN for electrolyte abnormalities due to worse renal function)   Daily Goals Checklist  Pain/Anxiety/Delirium protocol (if indicated): PRN only for RASS goal 0 to -1 VAP protocol (if indicated): bundle in place.  DVT prophylaxis: SCD Nutrition Status: TPN (intermittently on hold due to renal function/electrolyte abnormalities) GI prophylaxis: Pantoprazole Urinary catheter: external Central lines: PICC  3/25 Glucose control: -SSI + TPN Mobility/therapy needs: Bedrest Restraints: Restraint type:NA Reason for restraints:NA Daily labs: CBC, CMP daily Code Status: Partial - -no CV/DF (has an ICD however) Family Communication: per primary Disposition: ICU.   Goals of Care:  Last date of multidisciplinary goals of care discussion: 24/5 Family and staff present: Dr. Zenia Resides Summary of discussion: Family wishes for aggressive care.   Code Status:  Partial - no CV/DF by [place] (has an ICD which is not to be turned off)  CRITICAL CARE Performed by: Cristal Generous   Total critical care time: 49 minutes  Critical care time was exclusive of separately billable procedures and treating other patients. Critical care was necessary to treat or prevent imminent or life-threatening deterioration.  Critical care was time spent personally by me on the following activities: development of treatment plan with patient and/or surrogate as well as nursing, discussions with consultants, evaluation of patient's response to treatment, examination of patient, obtaining history from patient or surrogate, ordering and performing treatments and interventions, ordering and review of laboratory studies, ordering and review of radiographic studies, pulse oximetry and re-evaluation of patient's condition.  [name] MSN, AGACNP-BC [place] [phone] If no answer, [phone] 10/16/2020, 7:05 AM

## 2020-10-17 ENCOUNTER — Inpatient Hospital Stay (HOSPITAL_COMMUNITY): Payer: Managed Care, Other (non HMO) | Admitting: Certified Registered"

## 2020-10-17 ENCOUNTER — Inpatient Hospital Stay (HOSPITAL_COMMUNITY): Payer: Managed Care, Other (non HMO)

## 2020-10-17 ENCOUNTER — Encounter (HOSPITAL_COMMUNITY): Admission: RE | Disposition: E | Payer: Self-pay | Source: Home / Self Care | Attending: Surgery

## 2020-10-17 DIAGNOSIS — J9601 Acute respiratory failure with hypoxia: Secondary | ICD-10-CM | POA: Diagnosis not present

## 2020-10-17 HISTORY — PX: TRACHEOSTOMY TUBE PLACEMENT: SHX814

## 2020-10-17 LAB — CULTURE, RESPIRATORY W GRAM STAIN: Special Requests: NORMAL

## 2020-10-17 LAB — COMPREHENSIVE METABOLIC PANEL
ALT: 80 U/L — ABNORMAL HIGH (ref 0–44)
AST: 86 U/L — ABNORMAL HIGH (ref 15–41)
Albumin: 1.2 g/dL — ABNORMAL LOW (ref 3.5–5.0)
Alkaline Phosphatase: 83 U/L (ref 38–126)
Anion gap: 11 (ref 5–15)
BUN: 130 mg/dL — ABNORMAL HIGH (ref 6–20)
CO2: 15 mmol/L — ABNORMAL LOW (ref 22–32)
Calcium: 7.8 mg/dL — ABNORMAL LOW (ref 8.9–10.3)
Chloride: 112 mmol/L — ABNORMAL HIGH (ref 98–111)
Creatinine, Ser: 4.45 mg/dL — ABNORMAL HIGH (ref 0.61–1.24)
GFR, Estimated: 15 mL/min — ABNORMAL LOW (ref >60)
Glucose, Bld: 233 mg/dL — ABNORMAL HIGH (ref 70–99)
Potassium: 4.1 mmol/L (ref 3.5–5.1)
Sodium: 138 mmol/L (ref 135–145)
Total Bilirubin: 3.7 mg/dL — ABNORMAL HIGH (ref 0.3–1.2)
Total Protein: 6.8 g/dL (ref 6.5–8.1)

## 2020-10-17 LAB — GLUCOSE, CAPILLARY
Glucose-Capillary: 188 mg/dL — ABNORMAL HIGH (ref 70–99)
Glucose-Capillary: 200 mg/dL — ABNORMAL HIGH (ref 70–99)
Glucose-Capillary: 221 mg/dL — ABNORMAL HIGH (ref 70–99)
Glucose-Capillary: 241 mg/dL — ABNORMAL HIGH (ref 70–99)
Glucose-Capillary: 255 mg/dL — ABNORMAL HIGH (ref 70–99)
Glucose-Capillary: 283 mg/dL — ABNORMAL HIGH (ref 70–99)

## 2020-10-17 LAB — PHOSPHORUS: Phosphorus: 7.4 mg/dL — ABNORMAL HIGH (ref 2.5–4.6)

## 2020-10-17 LAB — CBC
HCT: 23.2 % — ABNORMAL LOW (ref 39.0–52.0)
Hemoglobin: 7.3 g/dL — ABNORMAL LOW (ref 13.0–17.0)
MCH: 28.6 pg (ref 26.0–34.0)
MCHC: 31.5 g/dL (ref 30.0–36.0)
MCV: 91 fL (ref 80.0–100.0)
Platelets: 252 10*3/uL (ref 150–400)
RBC: 2.55 MIL/uL — ABNORMAL LOW (ref 4.22–5.81)
RDW: 20 % — ABNORMAL HIGH (ref 11.5–15.5)
WBC: 11.4 10*3/uL — ABNORMAL HIGH (ref 4.0–10.5)
nRBC: 0.4 % — ABNORMAL HIGH (ref 0.0–0.2)

## 2020-10-17 LAB — MAGNESIUM: Magnesium: 2.3 mg/dL (ref 1.7–2.4)

## 2020-10-17 SURGERY — CREATION, TRACHEOSTOMY
Anesthesia: General | Site: Neck

## 2020-10-17 MED ORDER — ORAL CARE MOUTH RINSE
15.0000 mL | Freq: Two times a day (BID) | OROMUCOSAL | Status: DC
  Administered 2020-10-17 – 2020-11-11 (×51): 15 mL via OROMUCOSAL

## 2020-10-17 MED ORDER — DEXAMETHASONE SODIUM PHOSPHATE 10 MG/ML IJ SOLN
INTRAMUSCULAR | Status: DC | PRN
  Administered 2020-10-17: 5 mg via INTRAVENOUS

## 2020-10-17 MED ORDER — SODIUM CHLORIDE 0.9 % IV SOLN: INTRAVENOUS | Status: DC | PRN

## 2020-10-17 MED ORDER — MIDAZOLAM HCL 5 MG/5ML IJ SOLN
INTRAMUSCULAR | Status: DC | PRN
  Administered 2020-10-17: 2 mg via INTRAVENOUS

## 2020-10-17 MED ORDER — STERILE WATER FOR INJECTION IV SOLN
INTRAVENOUS | Status: DC
  Filled 2020-10-17 (×3): qty 1000

## 2020-10-17 MED ORDER — DEXAMETHASONE SODIUM PHOSPHATE 10 MG/ML IJ SOLN
INTRAMUSCULAR | Status: AC
  Filled 2020-10-17: qty 2

## 2020-10-17 MED ORDER — PHENYLEPHRINE 40 MCG/ML (10ML) SYRINGE FOR IV PUSH (FOR BLOOD PRESSURE SUPPORT)
PREFILLED_SYRINGE | INTRAVENOUS | Status: DC | PRN
  Administered 2020-10-17: 120 ug via INTRAVENOUS
  Administered 2020-10-17: 200 ug via INTRAVENOUS

## 2020-10-17 MED ORDER — ONDANSETRON HCL 4 MG/2ML IJ SOLN
INTRAMUSCULAR | Status: DC | PRN
  Administered 2020-10-17: 4 mg via INTRAVENOUS

## 2020-10-17 MED ORDER — MIDAZOLAM HCL 2 MG/2ML IJ SOLN
INTRAMUSCULAR | Status: AC
  Filled 2020-10-17: qty 2

## 2020-10-17 MED ORDER — PROPOFOL 10 MG/ML IV BOLUS
INTRAVENOUS | Status: DC | PRN
  Administered 2020-10-17 (×2): 10 mg via INTRAVENOUS

## 2020-10-17 MED ORDER — IPRATROPIUM-ALBUTEROL 0.5-2.5 (3) MG/3ML IN SOLN
3.0000 mL | Freq: Four times a day (QID) | RESPIRATORY_TRACT | Status: DC | PRN
  Administered 2020-11-12 – 2020-11-15 (×3): 3 mL via RESPIRATORY_TRACT
  Filled 2020-10-17 (×3): qty 3

## 2020-10-17 MED ORDER — ZINC CHLORIDE 1 MG/ML IV SOLN
INTRAVENOUS | Status: AC
  Filled 2020-10-17: qty 1238.4

## 2020-10-17 MED ORDER — ROCURONIUM BROMIDE 10 MG/ML (PF) SYRINGE
PREFILLED_SYRINGE | INTRAVENOUS | Status: DC | PRN
  Administered 2020-10-17 (×2): 30 mg via INTRAVENOUS

## 2020-10-17 MED ORDER — 0.9 % SODIUM CHLORIDE (POUR BTL) OPTIME
TOPICAL | Status: DC | PRN
  Administered 2020-10-17: 1000 mL

## 2020-10-17 MED ORDER — CHLORHEXIDINE GLUCONATE 0.12 % MT SOLN
15.0000 mL | Freq: Two times a day (BID) | OROMUCOSAL | Status: DC
  Administered 2020-10-17 – 2020-11-11 (×51): 15 mL via OROMUCOSAL
  Filled 2020-10-17 (×25): qty 15

## 2020-10-17 MED ORDER — PROPOFOL 10 MG/ML IV BOLUS
INTRAVENOUS | Status: AC
  Filled 2020-10-17: qty 20

## 2020-10-17 MED ORDER — ONDANSETRON HCL 4 MG/2ML IJ SOLN
INTRAMUSCULAR | Status: AC
  Filled 2020-10-17: qty 4

## 2020-10-17 MED ORDER — SUGAMMADEX SODIUM 200 MG/2ML IV SOLN
INTRAVENOUS | Status: DC | PRN
  Administered 2020-10-17: 150 mg via INTRAVENOUS

## 2020-10-17 SURGICAL SUPPLY — 35 items
BLADE CLIPPER SURG (BLADE) IMPLANT
CANISTER SUCT 3000ML PPV (MISCELLANEOUS) ×2 IMPLANT
COVER SURGICAL LIGHT HANDLE (MISCELLANEOUS) ×2 IMPLANT
COVER WAND RF STERILE (DRAPES) ×2 IMPLANT
DRAPE UTILITY XL STRL (DRAPES) ×2 IMPLANT
DRSG TEGADERM 4X4.75 (GAUZE/BANDAGES/DRESSINGS) ×2 IMPLANT
DRSG TELFA 3X8 NADH (GAUZE/BANDAGES/DRESSINGS) ×2 IMPLANT
ELECT CAUTERY BLADE 6.4 (BLADE) ×2 IMPLANT
ELECT REM PT RETURN 9FT ADLT (ELECTROSURGICAL) ×2
ELECTRODE REM PT RTRN 9FT ADLT (ELECTROSURGICAL) ×1 IMPLANT
GAUZE 4X4 16PLY RFD (DISPOSABLE) ×2 IMPLANT
GLOVE BIO SURGEON STRL SZ 6.5 (GLOVE) ×2 IMPLANT
GLOVE BIOGEL PI IND STRL 6 (GLOVE) ×1 IMPLANT
GLOVE BIOGEL PI INDICATOR 6 (GLOVE) ×1
GOWN STRL REUS W/ TWL LRG LVL3 (GOWN DISPOSABLE) ×2 IMPLANT
GOWN STRL REUS W/TWL LRG LVL3 (GOWN DISPOSABLE) ×4
INTRODUCER TRACH BLUE RHINO 6F (TUBING) ×1 IMPLANT
INTRODUCER TRACH BLUE RHINO 8F (TUBING) IMPLANT
KIT BASIN OR (CUSTOM PROCEDURE TRAY) ×2 IMPLANT
KIT TURNOVER KIT B (KITS) ×2 IMPLANT
NS IRRIG 1000ML POUR BTL (IV SOLUTION) ×2 IMPLANT
PACK EENT II TURBAN DRAPE (CUSTOM PROCEDURE TRAY) ×2 IMPLANT
PAD ARMBOARD 7.5X6 YLW CONV (MISCELLANEOUS) ×4 IMPLANT
PAD DRESSING TELFA 3X8 NADH (GAUZE/BANDAGES/DRESSINGS) ×1 IMPLANT
PENCIL BUTTON HOLSTER BLD 10FT (ELECTRODE) ×2 IMPLANT
SPONGE DRAIN TRACH 4X4 STRL 2S (GAUZE/BANDAGES/DRESSINGS) ×1 IMPLANT
SPONGE INTESTINAL PEANUT (DISPOSABLE) ×2 IMPLANT
SUT SILK 2 0 SH (SUTURE) IMPLANT
SUT VIC AB 2-0 SH 27 (SUTURE)
SUT VIC AB 2-0 SH 27XBRD (SUTURE) IMPLANT
SUT VICRYL AB 3 0 TIES (SUTURE) ×2 IMPLANT
SYR 20ML LL LF (SYRINGE) ×2 IMPLANT
TOWEL GREEN STERILE (TOWEL DISPOSABLE) ×2 IMPLANT
TOWEL GREEN STERILE FF (TOWEL DISPOSABLE) ×2 IMPLANT
TUBE CONNECTING 12X1/4 (SUCTIONS) ×2 IMPLANT

## 2020-10-17 NOTE — Progress Notes (Signed)
Trauma/Critical Care Follow Up Note  Subjective:    Overnight Issues:   Objective:  Vital signs for last 24 hours: Temp:  [98 F (36.7 C)-99 F (37.2 C)] 98.3 F (36.8 C) (04/08 0800) Pulse Rate:  [78-91] 84 (04/08 0800) Resp:  [21-27] 25 (04/08 0800) BP: (95-123)/(48-72) 112/62 (04/08 0800) SpO2:  [99 %-100 %] 100 % (04/08 0800) Arterial Line BP: (104-145)/(45-60) 132/54 (04/08 0800) FiO2 (%):  [30 %] 30 % (04/08 0400)  Hemodynamic parameters for last 24 hours:    Intake/Output from previous day: 04/07 0701 - 04/08 0700 In: 4069.1 [I.V.:3917.5; IV Piggyback:151.7] Out: 3460 [Urine:2350; Drains:1110]  Intake/Output this shift: Total I/O In: 234.5 [I.V.:209.5; IV Piggyback:25] Out: -   Vent settings for last 24 hours: Vent Mode: PRVC FiO2 (%):  [30 %] 30 % Set Rate:  [24 bmp] 24 bmp Vt Set:  [650 mL] 650 mL PEEP:  [5 cmH20] 5 cmH20 Plateau Pressure:  [22 cmH20-30 cmH20] 26 cmH20  Physical Exam:  Gen: comfortable, no distress Neuro: interactive HEENT: PERRL Neck: supple CV: RRR Pulm: unlabored breathing Abd: soft, NT, midline incision with good granulation tissue GU: clear yellow urine Extr: wwp, 1+ edema   Results for orders placed or performed during the hospital encounter of 09/15/2020 (from the past 24 hour(s))  I-STAT 7, (LYTES, BLD GAS, ICA, H+H)     Status: Abnormal   Collection Time: 10/16/20  8:53 AM  Result Value Ref Range   pH, Arterial 7.289 (L) 7.350 - 7.450   pCO2 arterial 29.8 (L) 32.0 - 48.0 mmHg   pO2, Arterial 85 83.0 - 108.0 mmHg   Bicarbonate 14.3 (L) 20.0 - 28.0 mmol/L   TCO2 15 (L) 22 - 32 mmol/L   O2 Saturation 95.0 %   Acid-base deficit 11.0 (H) 0.0 - 2.0 mmol/L   Sodium 145 135 - 145 mmol/L   Potassium 4.3 3.5 - 5.1 mmol/L   Calcium, Ion 1.16 1.15 - 1.40 mmol/L   HCT 23.0 (L) 39.0 - 52.0 %   Hemoglobin 7.8 (L) 13.0 - 17.0 g/dL   Collection site art line    Drawn by RT    Sample type ARTERIAL   Glucose, capillary      Status: Abnormal   Collection Time: 10/16/20 12:24 PM  Result Value Ref Range   Glucose-Capillary 106 (H) 70 - 99 mg/dL  Glucose, capillary     Status: Abnormal   Collection Time: 10/16/20  4:53 PM  Result Value Ref Range   Glucose-Capillary 101 (H) 70 - 99 mg/dL  Glucose, capillary     Status: Abnormal   Collection Time: 10/16/20  7:32 PM  Result Value Ref Range   Glucose-Capillary 171 (H) 70 - 99 mg/dL  Glucose, capillary     Status: Abnormal   Collection Time: 10/16/20 11:26 PM  Result Value Ref Range   Glucose-Capillary 203 (H) 70 - 99 mg/dL  Glucose, capillary     Status: Abnormal   Collection Time: 10/20/2020  3:40 AM  Result Value Ref Range   Glucose-Capillary 188 (H) 70 - 99 mg/dL  CBC     Status: Abnormal   Collection Time: 11/05/2020  4:26 AM  Result Value Ref Range   WBC 11.4 (H) 4.0 - 10.5 K/uL   RBC 2.55 (L) 4.22 - 5.81 MIL/uL   Hemoglobin 7.3 (L) 13.0 - 17.0 g/dL   HCT 23.2 (L) 39.0 - 52.0 %   MCV 91.0 80.0 - 100.0 fL   MCH 28.6 26.0 - 34.0  pg   MCHC 31.5 30.0 - 36.0 g/dL   RDW 20.0 (H) 11.5 - 15.5 %   Platelets 252 150 - 400 K/uL   nRBC 0.4 (H) 0.0 - 0.2 %  Comprehensive metabolic panel     Status: Abnormal   Collection Time: 11/07/2020  4:26 AM  Result Value Ref Range   Sodium 138 135 - 145 mmol/L   Potassium 4.1 3.5 - 5.1 mmol/L   Chloride 112 (H) 98 - 111 mmol/L   CO2 15 (L) 22 - 32 mmol/L   Glucose, Bld 233 (H) 70 - 99 mg/dL   BUN 130 (H) 6 - 20 mg/dL   Creatinine, Ser 4.45 (H) 0.61 - 1.24 mg/dL   Calcium 7.8 (L) 8.9 - 10.3 mg/dL   Total Protein 6.8 6.5 - 8.1 g/dL   Albumin 1.2 (L) 3.5 - 5.0 g/dL   AST 86 (H) 15 - 41 U/L   ALT 80 (H) 0 - 44 U/L   Alkaline Phosphatase 83 38 - 126 U/L   Total Bilirubin 3.7 (H) 0.3 - 1.2 mg/dL   GFR, Estimated 15 (L) >60 mL/min   Anion gap 11 5 - 15  Phosphorus     Status: Abnormal   Collection Time: 11/02/2020  4:26 AM  Result Value Ref Range   Phosphorus 7.4 (H) 2.5 - 4.6 mg/dL  Magnesium     Status: None    Collection Time: 11/04/2020  4:26 AM  Result Value Ref Range   Magnesium 2.3 1.7 - 2.4 mg/dL  Glucose, capillary     Status: Abnormal   Collection Time: 11/05/2020  7:59 AM  Result Value Ref Range   Glucose-Capillary 241 (H) 70 - 99 mg/dL    Assessment & Plan: The plan of care was discussed with the bedside nurse for the day, who is in agreement with this plan and no additional concerns were raised.   Present on Admission: . Neuroendocrine neoplasm of gastrointestinal tract . Calculus of gallbladder with acute cholecystitis . Pancreas cancer ([place]) . Neuroendocrine tumor    LOS: 25 days   Additional comments:I reviewed the patient's new clinical lab test results.   and I reviewed the patients new imaging test results.     41M with a neuroendocrine tumor of the head of the pancreas  Pancreatic NET - s/p right hemicolectomy, end ileostomy, Whipple and patch angioplasty repair of SMV. Complicated by postoperative hemorrhagic shock with DIC with extensive small bowel necrosis. All of small bowel has been resected, with external drainage of the bile duct, pancreas and stomach. - 1.1L out of drains combined, continue to monitor VDRF- extubated 3/30, reintubated 4/4, plan for trach today Shock- levo off this AM AKI- stable FEN - TPN, continue Protein calorie malnutrition DVT - SCDs, SQH ID- s/p 10d zosyn and 7d eraxis, empiric abx restarted 4/4 Goals of care- family desires maximal medical therapy in the hopes of a small bowel transplant. Palliative care already engaged.  [place], MD Trauma & General Surgery Please use [email or web address] to contact on call provider  10/30/2020  *Care during the described time interval was provided by me. I have reviewed this patient's available data, including medical history, events of note, physical examination and test results as part of my evaluation.

## 2020-10-17 NOTE — Anesthesia Preprocedure Evaluation (Signed)
Anesthesia Evaluation  Patient identified by MRN, date of birth, ID band Patient awake    Reviewed: Allergy & Precautions, NPO status , Patient's Chart, lab work & pertinent test results  Airway Mallampati: Intubated       Dental   Pulmonary asthma , former smoker,    breath sounds clear to auscultation       Cardiovascular hypertension,  Rhythm:Regular Rate:Normal     Neuro/Psych    GI/Hepatic Neg liver ROS, History noted CG   Endo/Other    Renal/GU Renal disease     Musculoskeletal   Abdominal   Peds  Hematology   Anesthesia Other Findings   Reproductive/Obstetrics                             Anesthesia Physical Anesthesia Plan  ASA: III  Anesthesia Plan: General   Post-op Pain Management:    Induction: Intravenous  PONV Risk Score and Plan: Treatment may vary due to age or medical condition  Airway Management Planned: Oral ETT  Additional Equipment:   Intra-op Plan:   Post-operative Plan: Post-operative intubation/ventilation  Informed Consent: I have reviewed the patients History and Physical, chart, labs and discussed the procedure including the risks, benefits and alternatives for the proposed anesthesia with the patient or authorized representative who has indicated his/her understanding and acceptance.     Dental advisory given  Plan Discussed with: CRNA and Anesthesiologist  Anesthesia Plan Comments:         Anesthesia Quick Evaluation

## 2020-10-17 NOTE — Progress Notes (Signed)
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 s/p right hemicolectomy, end ileostomy, Whipple and patch angioplasty repair of SMV. postoperative hemorrhagic shock with DIC with extensive small bowel necrosis. All of small bowel has been resected, with external drainage of the bile duct, pancreas and stomach. Extubated 3/30. Reintubated 4/5. S/p tracheostomy 4/8. PMH including AICD, heart failure, HTN, and dyslipidemia.    PT Comments    Pt now s/p tracheostomy this date with no significant changes in his functional status. Pt continuing to require TAx2 to lean anteriorly to sit up with bed in the egress position. Pt with minimal activation of muscles, especially in the arms compared to the legs. Pt unable to squeeze hands or track faces with eyes when cued this date. However, he would make eye contact or widen his eyes when his name was stated on occasion. Pt is motivated to participate and improve, agreeing to session with head nods and attempting to perform tasks when cued, but he demonstrates significant weakness. Positioned pt with HOB elevated to > 40 degrees and head turned towards L with use of towel roll to discourage his tendency to rotate his head to the R. Placed order and called ortho tech for pt to obtain hard PRAFOs to decrease risk of ankle plantarflexion contractures and pressure ulcers. Will continue to follow acutely. Current recommendations remain appropriate.   Follow Up Recommendations  SNF;LTACH     Equipment Recommendations  Wheelchair (measurements PT);Wheelchair cushion (measurements PT);Hospital bed;Other (comment) (hoyer lift)    Recommendations for Other Services       Precautions / Restrictions Precautions Precautions: Fall Precaution Comments: Trach on vent; x2 JP drains at  abdomen and x2 biliary drains; A-line Restrictions Weight Bearing Restrictions: No    Mobility  Bed Mobility Overal bed mobility: Needs Assistance Bed Mobility: Supine to Sit     Supine to sit: +2 for physical assistance;+2 for safety/equipment;Total assist;HOB elevated     General bed mobility comments: Bed in egress position, cuing pt to pull self anteriorly to sit up, but no physical attempt noted by pt, thus TAx2.    Transfers                 General transfer comment: Defer due to inability to sit up without TA.  Ambulation/Gait                 Stairs             Wheelchair Mobility    Modified Rankin (Stroke Patients Only)       Balance Overall balance assessment: Needs assistance Sitting-balance support: Bilateral upper extremity supported;Feet unsupported Sitting balance-[name]: Zero Sitting balance - Comments: No activation in trunk noted thus TA for sitting with back unsupproted on bed with bed in egress position. Postural control: Posterior lean;Right lateral lean                                  Cognition Arousal/Alertness: Awake/alert Behavior During Therapy: Flat affect Overall Cognitive Status: Difficult to assess                                 General Comments: Pt following <25% cues, needing extra time  to process and attempt. Pt with flat affect and not tracking faces with eyes. Able to gain pt's attention on occasion through stating his name.      Exercises Other Exercises Other Exercises: AAROM for cervical extension intermittently throughout session, attempt by pt noted but min success Other Exercises: Cued pt to squeeze hand, no success Other Exercises: Cued pt to flex elbows with gravity eliminated, noted trace activation of wrist flexors with attempt but no success Other Exercises: Cued pt to perform LAQ, x2 reps with <50% AROM Other Exercises: Pt performing ankle  dorsiflexion/plantarflexion ~3-4x bil AROM    General Comments General comments (skin integrity, edema, etc.): PRVC, PEEP 5, 30% concentration; VSS but BP measures consistently soft      Pertinent Vitals/Pain Pain Assessment: Faces Faces Pain Scale: Hurts a little bit Pain Location: Generalized with movement Pain Descriptors / Indicators: Discomfort;Grimacing Pain Intervention(s): Limited activity within patient's tolerance;Monitored during session;Repositioned    Home Living                      Prior Function            PT Goals (current goals can now be found in the care plan section) Acute Rehab PT Goals Patient Stated Goal: to participate with PT PT Goal Formulation: Patient unable to participate in goal setting Time For Goal Achievement: 10/23/20 Potential to Achieve Goals: Fair Progress towards PT goals: Not progressing toward goals - comment (pt with medical decline recently)    Frequency    Min 2X/week      PT Plan Current plan remains appropriate    Co-evaluation              AM-PAC PT "6 Clicks" Mobility   Outcome Measure  Help needed turning from your back to your side while in a flat bed without using bedrails?: Total Help needed moving from lying on your back to sitting on the side of a flat bed without using bedrails?: Total Help needed moving to and from a bed to a chair (including a wheelchair)?: Total Help needed standing up from a chair using your arms (e.g., wheelchair or bedside chair)?: Total Help needed to walk in hospital room?: Total Help needed climbing 3-5 steps with a railing? : Total 6 Click Score: 6    End of Session Equipment Utilized During Treatment: Oxygen Activity Tolerance: Patient limited by lethargy Patient left: in bed;with call bell/phone within reach;with bed alarm set;with SCD's reapplied;with nursing/sitter in room Nurse Communication: Mobility status;Other (comment) (ordering hard bil PRAFOs for pt) PT  Visit Diagnosis: Muscle weakness (generalized) (M62.81);Difficulty in walking, not elsewhere classified (R26.2);Pain Pain - part of body:  (generalized)     Time: 6720-9470 PT Time Calculation (min) (ACUTE ONLY): 32 min  Charges:  $Therapeutic Exercise: 8-22 mins $Therapeutic Activity: 8-22 mins                     [name], PT, DPT Acute Rehabilitation Services  Pager: [phone] Office: [phone]    Orvan Falconer 10/10/2020, 6:31 PM

## 2020-10-17 NOTE — Progress Notes (Signed)
PT Cancellation Note  Patient Details Name: [name] MRN: [id number] DOB: 30-Aug-1970   Cancelled Treatment:    Reason Eval/Treat Not Completed: Patient at procedure or test/unavailable. Pt off the floor at OR. Will follow-up later as time permits.   [name], PT, DPT Acute Rehabilitation Services  Pager: [phone] Office: [place] 10/13/2020, 10:01 AM

## 2020-10-17 NOTE — Progress Notes (Signed)
OT Cancellation Note  Patient Details Name: [name] MRN: [id number] DOB: 26-Jan-1971   Cancelled Treatment:    Reason Eval/Treat Not Completed: Patient at procedure or test/ unavailable (Pt off the floor at OR. Will return as schedule allows.)  [place], OTR/L Acute Rehab Pager: [phone] Office: [phone] 10/16/2020, 9:34 AM

## 2020-10-17 NOTE — Op Note (Signed)
   Operative Note  Date: 10/31/2020  Procedure: percutaneous tracheostomy without bronchoscopic assistance  Pre-op diagnosis: prolonged mechanical ventilation Post-op diagnosis: same  Indication and clinical history: The patient  is a 50 y.o. year old male with prolonged mechanical ventilation  Surgeon: [name], MD Assistant: Grandville Silos, MD  Anesthesia: general  Findings:  . Specimen: none . EBL: <5cc  . Drains/Implants: #6 Shiley cuffed tracheostomy tube  Disposition: ICU  Description of procedure: The patient was positioned supine with a shoulder roll. Time-out was performed verifying correct patient, procedure, signature of informed consent, and pre-operative antibiotics as indicated. MAC induction was uneventful and the patient was confirmed to be on 100% FiO2. The neck was prepped and draped in the usual sterile fashion. Palpation of neck anatomy was performed. A longitudinal incision was made in the neck and deepened down to the trachea.   The pilot balloon was deflated and the endotracheal tube slowly retracted proximally until the tip of the endotracheal tube was palpated just below the cricoid cartilage. An introducer needle was inserted between the second and third tracheal rings. A guidewire was passed through the introducer needle and the needle removed. Serial dilation was performed and a #6 cuffed Shiley and stylet were inserted over the guidewire and the guidewire and stylet removed. The inner cannula was inserted, the pilot balloon on the tracheostomy inflated, and the ventilator tubing disconnected from the endotracheal tube and connected to the tracheostomy. Chest rise, appropriate end-tidal CO2, and return tidal volumes were confirmed. The tracheostomy tube was sutured in four quadrants and a tracheostomy tie applied to the neck. The endotracheal tube was removed. The patient tolerated the procedure well. There were no complications. Post-procedure chest x-ray was  ordered to confirm tube position and the absence of a pneumothorax.   [name], MD General and [facility]

## 2020-10-17 NOTE — Progress Notes (Signed)
Nutrition Follow-up  DOCUMENTATION CODES:   Not applicable  INTERVENTION:  TNA to meet nutrition needs 20% intralipid 250 ml/12 hr every Mon, Thur to prevent EFAD   Pt at high risk of malnutrition due to catabolic state.  Multiple drains in place with 1110 ml output  NUTRITION DIAGNOSIS:   Increased nutrient needs related to post-op healing as evidenced by estimated needs; ongoing  GOAL:   Patient will meet greater than or equal to 90% of their needs; met with TPN  MONITOR:   Vent status,Labs  REASON FOR ASSESSMENT:   Ventilator    ASSESSMENT:   Pt with a PMH of HTN, HLD, HF with reduced EF, AICD and dx mass adjacent to head of the pancreas in 2021.  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  Developed cholecystitis in Feb 2022 and had percutaneous cholecystectomy tube placement.  Presented this admission for neuroendocrine tumor resection with Whipple procedure, also had right total colectomy, colostomy and cholecystectomy on 10/01/2020.  SMV was lacerated during procedure and repaired by bovine pericardial patch.  Developed hemorrhagic shock with bile leak post-op and underwent placement of biliary T tube with abdominal washout on 3/15. Per CCM [facility] evaluating for SB transplant.   3/14 s/p whipple; intra-operative SMV laceration s/p repair; massive transfusion protocol - 120 products; maxed on pressors  3/15 s/p washout and VAC placement  3/16 off pressors; VAC output decreasing, hematemesis with NG not functioning, OG placed 3/17 s/p re-opening of recent laparotomy, SBR, VAC placement (per OR report pt with SB necrosis, only viable areas are stomach pb limb, 40-50 cm beyond GJ anastomosis, and colon) Pt will be TPN dependent. Concern that PB limb may become necrotic.  3/18 TPN initiated @ 45 ml/hr with plans to advance to 90 ml on 3/19 3/22 entirety of SB resected due to necrosis.  3/25 lipids removed from TPN due to elevated TG 3/30 extubated   4/5 re-intubated 4/8 s/p trach placement  Pt underwent tracheostomy procedure today. RD contacted via pharmacy regarding worsening renal function and question to decrease protein in TPN. Per Nephrology, pt with ARF in ATN, compromised renal function due to repeated insults, critical illness. Likely need RRT within the next 24-48 hours. Discussed with Pharmacy, pt with increased caloric and protein needs due to catabolic state/illness. Plans to continue current TPN prescription with no plans to decrease protein needs. Pharmacy continues to work on EFA infusion to prevent EFAD.   Medications reviewed and include: SSI, protonix Precedex  Levophed @ 6 mcg  Phenergan IV Labs reviewed: BUN: 130, Cr: 4.45, Albumin 1.2, TG:363 CBG's: 101-164   Output:  UOP: 2350 ml   Drain 3: 350 ml  R drain: 445 ml  RUQ drain: 180 ml  RLQ drain: 135 ml  20 F G tube: 0 ml Total output: 1110 ml   TPN @ 90 ml/hr provides: 2983 kcal and 185 grams protein   Diet Order:   Diet Order            Diet NPO time specified  Diet effective now                 EDUCATION NEEDS:   No education needs have been identified at this time  Skin:  Skin Assessment: Skin Integrity Issues: Skin Integrity Issues:: Incisions,Wound VAC Wound Vac: abdomen Incisions: abdomen, neck  Last BM:  none  Height:   Ht Readings from Last 1 Encounters:  10/14/20 6' 2"  (1.88 m)  Weight:   Wt Readings from Last 1 Encounters:  10/15/20 94.4 kg    BMI:  Body mass index is 26.72 kg/m.  Estimated Nutritional Needs:   Kcal:  2800-3000  Protein:  185-210 grams  Fluid:  > 2L/day  Corrin Parker, MS, RD, LDN RD pager number/after hours weekend pager number on Amion.

## 2020-10-17 NOTE — Progress Notes (Signed)
Orthopedic Tech Progress Note Patient Details:  [name] November 03, 1970 [id number]  Ortho Devices Type of Ortho Device: Prafo boot/shoe Ortho Device/Splint Location: BLE Ortho Device/Splint Interventions: Ordered,Application,Adjustment   Post Interventions Patient Tolerated: Well Instructions Provided: Other (comment)   [name] 10/13/2020, 5:23 PM

## 2020-10-17 NOTE — Transfer of Care (Signed)
Immediate Anesthesia Transfer of Care Note  Patient: [name]  Procedure(s) Performed: TRACHEOSTOMY (N/A Neck)  Patient Location: ICU  Anesthesia Type:General  Level of Consciousness: drowsy  Airway & Oxygen Therapy: Patient Spontanous Breathing and Patient connected to nasal cannula oxygen  Post-op Assessment: Report given to RN and Post -op Vital signs reviewed and stable  Post vital signs: Reviewed and stable  Last Vitals:  Vitals Value Taken Time  BP    Temp    Pulse 83 11/03/2020 1029  Resp 24 11/04/2020 1029  SpO2 98 % 10/25/2020 1029  Vitals shown include unvalidated device data.  Last Pain:  Vitals:   10/23/2020 0800  TempSrc: Rectal  PainSc:          Complications: No complications documented.

## 2020-10-17 NOTE — Progress Notes (Signed)
Pharmacy Antibiotic Note  [name] is a 50 y.o. male admitted on 09/11/2020 with pneumonia.  Pharmacy has been consulted for zosyn dosing.  Pt has been here for an extended period of time for neuroendocrine tumor. He has had several courses of abx. Empiric Zosyn was restarted on 4/5. WBC down to 11.4 and fevers seem to have abated.   Scr up to 4.45, CrCl ~ 23 mL/min   Plan: Continue Zosyn 3.375 gm IV Q 8 hours (EI infusion)  Height: 6\' 2"  (188 cm) Weight: 94.4 kg (208 lb 1.8 oz) IBW/kg (Calculated) : 82.2  Temp (24hrs), Avg:98.5 F (36.9 C), Min:98 F (36.7 C), Max:99 F (37.2 C)  Recent Labs  Lab 10/11/20 0459 10/12/20 0500 10/13/20 0519 10/14/20 0223 10/15/20 0703 10/15/20 0859 10/15/20 1600 10/15/20 2214 10/16/20 0500 10/29/2020 0426  WBC 10.7*  --  11.1*  --  15.4*  --   --   --  11.0* 11.4*  CREATININE 2.41*   < > 2.22*   < >  --  3.10* 3.54* 3.68* 4.04* 4.45*   < > = values in this interval not displayed.    Estimated Creatinine Clearance: 23.3 mL/min (A) (by C-G formula based on SCr of 4.45 mg/dL (H)).    Allergies  Allergen Reactions  . Ace Inhibitors Cough    Antimicrobials this admission: Vanc 3/18 >> 3/21 CTX 3/17 >> 3/21 Flagyl 3/17 >> 3/21 Zosyn 3/21 >> 3/30; 4/5>> Eraxis 3/22 >> 3/29  Dose adjustments this admission:   Microbiology results: 3/14 MRSA PCR - negative 3/23 BCx - negF 4/5 TA >> rare GNRs, abundant coccobacilli    Albertina Parr, PharmD., BCPS, BCCCP Clinical Pharmacist Please refer to [facility] for unit-specific pharmacist

## 2020-10-17 NOTE — Anesthesia Postprocedure Evaluation (Signed)
Anesthesia Post Note  Patient: [name]  Procedure(s) Performed: TRACHEOSTOMY (N/A Neck)     Patient location during evaluation: PACU Anesthesia Type: General Level of consciousness: patient remains intubated per anesthesia plan Pain management: pain level controlled Vital Signs Assessment: post-procedure vital signs reviewed and stable Respiratory status: patient remains intubated per anesthesia plan Postop Assessment: no apparent nausea or vomiting Anesthetic complications: no   No complications documented.  Last Vitals:  Vitals:   11/06/2020 1030 10/11/2020 1045  BP: 118/69   Pulse: 82 84  Resp: (!) 21 (!) 21  Temp:    SpO2: 97% 98%    Last Pain:  Vitals:   10/21/2020 0800  TempSrc: Rectal  PainSc:                  [name]

## 2020-10-17 NOTE — Progress Notes (Signed)
PHARMACY - TOTAL PARENTERAL NUTRITION CONSULT NOTE  Indication:  Expected prolonged ileus  Patient Measurements: Height: 6\' 2"  (188 cm) Weight: 124.9 kg (275 lb 5.7 oz) IBW/kg (Calculated) : 82.2 TPN AdjBW (KG): 95.3 Body mass index is 35.35 kg/m.  Assessment:  65 YOM with history of mass adjacent to head of the pancreas in 2021. 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  Developed cholecystitis in Feb 2022 and had percutaneous cholecystectomy tube placement.  Presented this admission for neuroendocrine tumor resection with Whipple procedure, also had right total colectomy, colostomy and cholecystectomy on 09/28/2020. SMV was lacerated during procedure and repaired by bovine pericardial patch. Developed hemorrhagic shock with bile leak post-op and underwent placement of biliary T tube with abdominal washout on 3/15 PM. Abdomen remains open. Pharmacy consulted to manage TPN.  Glucose / Insulin: no hx DM, A1c 5.2%. CBGs 150s-230s on TPN (held yesterday due to hyperK).  Utilized 31 units novolog in the past 24h hours. 50 units in TPN -scheduled novolog to 2 units q4h (hold for CBG<150).  Electrolytes: Na down to 138, Cl 112, HCO3 low (on max acetate) iCa 1.16 (none in TPN), K peaked at 5.8 requiring treatment, K 4.1 this AM (no K in TPN), Mg 2.3, Phos down to 7.4 (no phos in TPN)   Renal: AKI - SCr up to 4.45 (BL ~1-1.2), BUN up to 130 Hepatic: LFTs mildly elevated, Tbili 3.7 (scleral yellowing still noted per RN 4/3) Albumin 1.2, prealbumin 25.1>13.3 (4.4) TG peaked at 740, now back up to 363 on 4/7 - will attempt to supplement EFA 100g/wk and monitor TG trends - will plan for Mon/Thurs supplementation. Next TG check on 4/11 AM. Intake / Output; MIVF: UOP 1 ml/kg/hr, drain (4 total): 1110 cc/24h, 0 cc from gastrostomy, +66mL yest  GI Imaging: none since TPN start GI Surgeries / Procedures:  3/14 Whipple procedure with R hemicolectomy and end ileostomy,  cholecystectomy 3/15 washout, VAC placement 3/17 washout, VAC replacement.  Necrosis of entire small bowel except for the PB limb and proximal 40-50cm of jejunum 3/19 re-opening laparotomy, abd closure.  Necrotic SB. 3/21 SB resection, take down of choledochojejunal anastomosis and pancreaticojejunal anastomosis, take down of duodenojejunal anastomosis, placement of externalized biliary drain / Stamm gastrostomy tube, IA washout and placement of intraperitoneal drains 3/30 extubation  Central access: R IJ CVC placed 3/15; changed to PICC placed 10/03/20 TPN start date: 09/17/2020  Nutritional Goals (RD rec on 3/30): 2800-3000 kCal, 185-210g AA, fluid >2L/day  Current Nutrition:  NPO - failed SLP eval 4/1 TPN  Plan:  - Continue concentrated TPN at 90 ml/hr. Adding sodium bicarb at 50 mL/hr for persistent metabolic acidosis despite max acetate in TPN bag.  - Planning intralipid 20% 250 cc/12h (~50g lipids) supplements 2x/wk for now to prevent EFAD - last 4/4, planning Mon/Thurs moving forward while monitoring TG trends - TPN with intralipid 2x/wk provides a daily average of 85g AA and 2983 kCal, meeting 100% of patient needs.  - Electrolytes in TPN: Remove K from TPN, Increase Na to 50 meQ/L, Remove Ca & Phos, Continue Mg 3 mEq/L, max acetate - Daily multivitamin in TPN.  Remove standard trace elements and add back zinc 5mg , chromium 81mcg, selenium 43mcg  - Continue resistant SSI Q4H and continue 50 units regular insulin in TPN and 2u regular insulin q6h - Standard TPN labs, TG on Mon/Thur -To OR for trach today    Thank you for allowing pharmacy to be a part  of this patient's care.  Albertina Parr, PharmD., BCPS, BCCCP Clinical Pharmacist Please refer to [facility] for unit-specific pharmacist

## 2020-10-17 NOTE — Consult Note (Signed)
WOC Nurse Consult Note: Patient receiving care in [facility] 4N30. Reason for Consult: NPWT to midline abdominal wound Wound type: surgical Pressure Injury POA: Yes/No/NA Measurement: 22 cm x 5 cm x 4 cm Wound bed: approximately 25 % yellow in base of wound, remainder pink, clean Drainage (amount, consistency, odor) to be determined Periwound: intact Dressing procedure/placement/frequency: two pieces of black foam placed into wound. Drape applied, immediate seal obtained. Patient tolerated without difficulty. Due to the straightforward nature of this dressing application, I am turning this over for bedside RN to perform. [place] nurse will not follow at this time.  Please re-consult the [place] team if needed.  Val Riles, RN, MSN, CWOCN, CNS-BC, pager [phone]

## 2020-10-17 NOTE — Progress Notes (Signed)
18 Days Post-Op  Subjective: No acute changes. Afebrile. Levo at 2. BUN/Cr trending up.    Objective: Vital signs in last 24 hours: Temp:  [97.5 F (36.4 C)-99 F (37.2 C)] 98 F (36.7 C) (04/08 0400) Pulse Rate:  [78-91] 81 (04/08 0600) Resp:  [21-27] 22 (04/08 0600) BP: (95-123)/(48-72) 111/64 (04/08 0600) SpO2:  [99 %-100 %] 100 % (04/08 0600) Arterial Line BP: (104-145)/(45-60) 122/51 (04/08 0600) FiO2 (%):  [30 %] 30 % (04/08 0400) Last BM Date:  (pta)  Intake/Output from previous day: 04/07 0701 - 04/08 0700 In: 4069.1 [I.V.:3917.5; IV Piggyback:151.7] Out: 3460 [Urine:2350; Drains:1110] Intake/Output this shift: No intake/output data recorded.  PE: General: appears ill Neuro: awake, follows commands, GCS 11T Resp: intubated, on vent CV: RRR Abdomen: soft, nondistended, midline incision open at skin, wound is clean and dry. JPx2 with milk fluid, likely pancreatic. Biliary drain with bile. LUQ pancreatic stent with clear colorless fluid. G tube in place to gravity LUQ.   Lab Results:  Recent Labs    10/16/20 0500 10/16/20 0853 10/18/2020 0426  WBC 11.0*  --  11.4*  HGB 7.3* 7.8* 7.3*  HCT 23.7* 23.0* 23.2*  PLT 242  --  252   BMET Recent Labs    10/16/20 0500 10/16/20 0853 10/15/2020 0426  NA 141 145 138  K 4.4 4.3 4.1  CL 114*  --  112*  CO2 15*  --  15*  GLUCOSE 183*  --  233*  BUN 120*  --  130*  CREATININE 4.04*  --  4.45*  CALCIUM 7.7*  --  7.8*   PT/INR No results for input(s): LABPROT, INR in the last 72 hours. CMP     Component Value Date/Time   NA 138 10/29/2020 0426   NA 142 07/21/2020 0921   K 4.1 10/13/2020 0426   CL 112 (H) 11/02/2020 0426   CO2 15 (L) 10/21/2020 0426   GLUCOSE 233 (H) 11/04/2020 0426   BUN 130 (H) 10/16/2020 0426   BUN 16 07/21/2020 0921   CREATININE 4.45 (H) 11/07/2020 0426   CREATININE 1.16 03/01/2016 1607   CALCIUM 7.8 (L) 10/21/2020 0426   PROT 6.8 10/22/2020 0426   PROT 6.7 07/21/2020 0921    ALBUMIN 1.2 (L) 10/23/2020 0426   ALBUMIN 4.1 07/21/2020 0921   AST 86 (H) 11/01/2020 0426   ALT 80 (H) 10/15/2020 0426   ALKPHOS 83 10/26/2020 0426   BILITOT 3.7 (H) 10/20/2020 0426   BILITOT 0.8 07/21/2020 0921   GFRNONAA 15 (L) 10/10/2020 0426   GFRNONAA >89 09/22/2015 1008   GFRAA 82 07/21/2020 0921   GFRAA >89 09/22/2015 1008   Lipase     Component Value Date/Time   LIPASE 23 08/22/2020 0318       Studies/Results: CT CHEST ABDOMEN PELVIS WO CONTRAST  Result Date: 10/15/2020 CLINICAL DATA:  Fever, leukocytosis. EXAM: CT CHEST, ABDOMEN AND PELVIS WITHOUT CONTRAST TECHNIQUE: Multidetector CT imaging of the chest, abdomen and pelvis was performed following the standard protocol without IV contrast. COMPARISON:  August 21, 2020. FINDINGS: CT CHEST FINDINGS Cardiovascular: No significant vascular findings. Normal heart size. No pericardial effusion. Mediastinum/Nodes: Endotracheal tube is in good position. Thyroid gland is unremarkable. Esophagus is unremarkable. No adenopathy is noted. Lungs/Pleura: No pneumothorax or pleural effusion is noted. Large right lower lobe airspace opacity is noted concerning for pneumonia or possibly atelectasis. Mild left lower lobe opacity is noted concerning for pneumonia or atelectasis. Musculoskeletal: No chest wall mass or suspicious bone  lesions identified. CT ABDOMEN PELVIS FINDINGS Hepatobiliary: Status post cholecystectomy. No biliary dilatation is noted. Probable small subcapsular fluid collection is seen posteriorly and inferiorly along the right hepatic lobe. Pancreas: Status post surgical resection of pancreatic head and mass. There appears to be a stent or catheter within the expected position of pancreatic duct which is nondilated. This stent appears to be externalized. Swelling of the residual pancreas is noted which most likely is postoperative in etiology. Spleen: Normal in size without focal abnormality. Adrenals/Urinary Tract: Adrenal glands  appear normal. Right renal atrophy is noted. No hydronephrosis or renal obstruction is noted. No renal or ureteral calculi are noted. Urinary bladder is unremarkable. Stomach/Bowel: Gastrostomy tube is seen well position within the distal gastric lumen. The patient appears to be status post right colectomy. At least 3 surgical drains are seen entering the right lower quadrant which extend to the right upper quadrant and porta hepatis region. No bowel dilatation is noted. The patient appears to have undergone significant small bowel resection is well. Vascular/Lymphatic: No significant vascular findings are present. No enlarged abdominal or pelvic lymph nodes. Reproductive: Prostate is unremarkable. Other: 5.4 x 5.2 cm fluid collection is noted in the posterior portion of the pelvis in the pre rectal space concerning for abscess. Small amount of gas and fluid is noted in the abdomen consistent with postoperative status. Large midline ventral surgical incision is noted. Musculoskeletal: No acute or significant osseous findings. IMPRESSION: Status post surgical resection of pancreatic head and mass. Patient also appears to be status post right colectomy and significant small bowel resection. 5.4 x 5.2 cm fluid collection is noted in the posterior portion of the pelvis in the pre rectal space concerning for abscess. Gastrostomy tube is in good position. At least 3 surgical drains are seen entering the right lower quadrant with tips directed toward the right upper quadrant and porta hepatis region. Also noted is apparent stent or catheter within the pancreatic duct which is externalized. Bilateral posterior basilar airspace opacities are noted, right much larger than left, concerning for pneumonia or possibly atelectasis. Electronically Signed   By: Marijo Conception M.D.   On: 10/15/2020 12:12    Assessment/Plan 50 yo male 3 weeks s/p Whipple, right colectomy and SMV repair for T3N0 neuroendocrine tumor. C/b postop  hemorrhagic shock with DIC and subsequent diffuse small bowel necrosis, now s/p small bowel resection with external drainage of bile duct, pancreatic duct and stomach. - Continue Zosyn for aspiration pneumonia, BAL cultures pending. - Pelvic fluid not easily amenable to drainage. Fevers and leukocytosis have resolved with abx. Will defer drain placement for now as I do not feel this is the primary source of sepsis. - AKI: potassium normal, patient is making urine. Nephrology following, do not feel RRT is indicated yet, appreciate recommendations. - Continue TPN, potassium removed - To OR today for trach with Dr. Bobbye Morton - VTE: [facility] - Dispo: ICU.    LOS: 25 days    Michaelle Birks, MD [facility] General, Hepatobiliary and Pancreatic Surgery 10/22/2020 7:39 AM

## 2020-10-17 NOTE — Progress Notes (Signed)
[name], MRN:  [id number], DOB:  [birth year], LOS: 34 ADMISSION DATE:  09/17/2020, CONSULTATION DATE:  10/25/2020 REFERRING MD:  Anesthesia, CHIEF COMPLAINT:  Whipple, post-op shock   Brief History:  50 y.o. M with PMH of neuroendocrine tumor and underwent Whipple procedure 3/14 with intra-operative SMV laceration and repair by vascular with bovine pericardial patch.  Pt had worsening shock overnight and required three pressors despite massive transfusion protocol (received 150 blood products).  He was taken back to the OR for washout on 3/15 and returned to the ICU intubated.  Taken back 3/17 again found to have small bowel necrosis s/p resection.  Plans again to return 3/19 to OR.   Totality of small bowel has been resected. The case was discussed with [facility] who may consider eval for sm intestinal transplant pending clinical course.  Continuing aggressive supportive care.   Past Medical History:   has a past medical history of AICD (automatic cardioverter/defibrillator) present, Dyslipidemia, Heart failure with mildly reduced EF, History of kidney stones, HTN (hypertension), ventricular fibrillation, and Nonischemic cardiomyopathy.  Significant Hospital Events:  3/14: Admit to Surgery, to OR for whipple, SMV injury and repair, massive transfusion protocol overnight  3/15: Back to OR for washout, x2. IR for arteriogram. No major bleed source identified in OR, or arterial bleed w IR. Robust product resuscitation >75 products (+ TXA + novoseven) 3/16: off pressors, add'l 39 products overnight. Slowing resuscitation this morning with hemodynamic improvements and slowing drain output; OR x 2 - washout, biliary t tube, wound vac -- washout, wound vac, IR for arteriogram -- no arterial bleed for embolization 3/17:  Aggressive balanced transfusion continue overnight with marked hemodynamic improvement since yesterday evening. Off pressors x several hours, Wound vac output significantly slowing  (from 522ml q15-79min to 589ml q1hr+) and output is much thinner, Thin bloody secretions from mouth approx 264ml, Decreased RR from 22 to 18, Unable to tolerate NE- severe bradycardia 3/19 taken back to OR. Small bowel noted to be almost completely necrosed. Patient was closed with plans for goals of car discussion.  3/20 GOC family endorses full scope of treatment. Primary team discussed with [facility] the possibility of transfer for small bowel transplant. [facility] felt as though transfer would not change outcome. 3/21 Ongoing discussion with family and [facility]. Patient may be a candidate for transplant if he can stabilize post a small bowel resection. He was taken back to the OR and small bowel was resected.  3/23 weaning pressors. Versed off. Remains encephalopathic 3/24 off pressors. Low dose dilaudid gtt -- only weakly grimaces to pain. Changing sedation to precedex + PRN fentanyl. Long discussion with 2 brothers regarding clinical case -- tried to clarify that while the term "stable" has been used, in this instance is meaning that he has not declined from previous shift but is in fact still critically ill with multisystem organ dysfunction.  3/25 Cr and BUN increased. Off sedation  3/26 Pressors off, CT head benign, BUN/Cr continue to creep up 3/27 Tachycardia/HTN overnight improved with fentanyl gtt 2/28 PCCM sign off. Trauma to take over vent/CCM needs.  3/30 extubated 4/2 desaturated overnight to the 30s improved with BVM and NTS.  4/5 poor airway protection. PCCM consulted for re-intubation.  4/6 CT a/p Large RLL opacity, smaller LLL opacity. Post op changes. 5.4cm fluid collection in posterior pelvis, concerning for abscess. Started on Zosyn  4/7 got transfused overnight and then diuresed. Worse renal function  Consults:  PCCM VVS  IR  Palliative  Procedures:  PICC 3/25 > JP x 2  Biliary drain Pancreatic stent drain ETT 2/24>>3/13; 3/15 >> 3/29;  4/5 >   Significant Diagnostic Tests:   See Radiology tab   Most recent significant exam was CT a/p 4/6: which revealed R>L pulmonary opacities, and pelvic fluid collection concerning for abscess as well as detailing the numerous post-op changes.   Micro Data:  See micro tab 4/5 trach asp >> Gram neg coccobacilli   Antimicrobials:  Zosyn 3/21 > 3/29; 4/5 >> Eraxis 3/23> 3/29  Interim History / Subjective:  For trach today with Trauma service. Requesting to get up out of bed.   Objective   Blood pressure 112/62, pulse 84, temperature 98.3 F (36.8 C), temperature source Rectal, resp. rate (!) 25, height 6\' 2"  (1.88 m), weight 94.4 kg, SpO2 100 %.    Vent Mode: PRVC FiO2 (%):  [30 %] 30 % Set Rate:  [24 bmp] 24 bmp Vt Set:  [650 mL] 650 mL PEEP:  [5 cmH20] 5 cmH20 Plateau Pressure:  [22 cmH20-30 cmH20] 26 cmH20   Intake/Output Summary (Last 24 hours) at 10/25/2020 0830 Last data filed at 10/30/2020 0800 Gross per 24 hour  Intake 3859.7 ml  Output 3460 ml  Net 399.7 ml   Filed Weights   09/28/2020 0639 10/01/20 1503 10/15/20 1503  Weight: 95.3 kg 124.9 kg 94.4 kg   Examination:  General:  Obese man in no distress.  HEENT: [place]. ETT secure. Mild scleral icterus, mucus membranes are moist Neuro: Awakens to voice, follows commands. Tremor BUE L>R. Generalized weakness.  CV: rr with some PVCs. Cap refill < 3 seconds. S1s2, JVP visible at 3cm ASA PULM:  Symmetrical chest expansion, bilateral rhonchi, diminished bibasilar sounds, moderate thick tan secretions GI: Round, mild tenderness localized to surgical site. Multiple drains-- JPs with turbid fluid. Wound is clean and without purulence.  Extremities: Non pitting edema. No cyanosis or clubbing  Skin: c/d/w no rash   Resolved Hospital Problem list   Hemorrhagic shock hypophosphatemia  Assessment & Plan:   Critically ill due to acute hypoxemic respiratory failure requiring mechanical ventilation. due to generalized weakness and possible aspiration  pneumonia -Continue daily SBT - trach collar  -Complete 7 days of Zosyn  Pancreatic neuroendocrine tumor s/p whipple 3/21 b/c hemorrhagic shock, c/b small bowel necrosis now s/p total small bowel resection and externa; drainage for bile duct pancreatic duct and stomach.  - Continue TPN - Family's continued hope is for transfer to [facility] for small bowel transplant. Suspect pt has quite a ways to go before stable enough to be considered, and am unsure if underlying conditions like NICM would preclude candidacy. Defer these conversations to CCS   Critically ill due to septic shock requiring titration of NE, suspect septic in setting of PNA and possible pelvic abscess.  - Weaned off NE today.    AKI, worse since reintubation - possible hypotensive hit - No current indications for RRT, Nephrology following.  - Patient appears intravascularly volume replete.   NAGMA due to AKI -Increase acetate in TPN  Transaminitis, mild due to TPN - follow.  Hyperglycemia due to stress hyperglycemia and TPN - rSSI   Anemia of critical illness  - follow CBC - Transfuse for hgb < 7.   NICM s/p ICD -Well compensated at this time.   [place] Suspect poor prognosis.  The family hopes for a small intestine transplant -- I think there are currently several acute barriers before the pt could even possibly be considered  for potential candidacy -He has required reintubation, he has PNA, possible pelvic abscess, shock, he is severely debilitated, his renal function is worse (temporarily off TPN for electrolyte abnormalities due to worse renal function)   Daily Goals Checklist  Pain/Anxiety/Delirium protocol (if indicated): PRN only for RASS goal 0 to -1 VAP protocol (if indicated): bundle in place.  DVT prophylaxis: SCD Nutrition Status: TPN  GI prophylaxis: Pantoprazole Urinary catheter: external Central lines: PICC 3/25 Glucose control: -SSI + TPN Mobility/therapy needs: Mobilize to chair if  possiblet Restraints: Restraint type:NA Reason for restraints:NA Daily labs: CBC, CMP daily Code Status: Partial - -no CV/DF (has an ICD however) Family Communication: per primary Disposition: ICU.   Goals of Care:  Last date of multidisciplinary goals of care discussion: 19/5 Family and staff present: Dr. Zenia Resides Summary of discussion: Family wishes for aggressive care.   Code Status:  Partial - no CV/DF by [place] (has an ICD which is not to be turned off)  CRITICAL CARE Performed by: Kipp Brood   Total critical care time: 40 minutes  Critical care time was exclusive of separately billable procedures and treating other patients. Critical care was necessary to treat or prevent imminent or life-threatening deterioration.  Critical care was time spent personally by me on the following activities: development of treatment plan with patient and/or surrogate as well as nursing, discussions with consultants, evaluation of patient's response to treatment, examination of patient, obtaining history from patient or surrogate, ordering and performing treatments and interventions, ordering and review of laboratory studies, ordering and review of radiographic studies, pulse oximetry and re-evaluation of patient's condition.  Kipp Brood, MD [facility] ICU Physician [place]  Pager: [phone] Or Epic Secure Chat After hours: [phone].  10/22/2020, 8:49 AM

## 2020-10-17 NOTE — Progress Notes (Signed)
[facility] Progress Note    Assessment/ Plan:   Assessment/Plan: Acute renal failure - he has reasons to be in ATN  With hypotensive episodes requiring Levophed in the setting of a very complicated surgical history with multiple visits to the OR + DIC + diffuse small bowel necrosis. Creatinine fluctuating represents compromised renal function with unfortunately repeated insults. No evidence of obstruction and UOP may be starting to decrease  over the past 24hrs; continued purulent drainage from JP drain in abdomen.  - Pt and family desire all aggressive interventions, confirmed yesterday.  Cr is worse and BUN in the 130s.  He will need RRT likely in the next 24-48 hrs. - support continued discussions with palliative care - Avoid nephrotoxins and dose medications for a GFR < 15 ml/min.  2. Respiratory failure reintubated now on the ventilator--> some concern for pneumonia 3. Hyperkalemia: resolved, TPN being reformulated 4. Neuroendocrine tumor s/p Whipple with unfortunate extensive small bowel necrosis s/p resection.  Family is hopeful for a small bowel transplant, however this is quite a long ways off d/t multiple issues.  CT 4/6 showing possible pelvic abscess, has been started on Zosyn 5. Severe malnutrition on TPN. 6. Anemia   Subjective:    Seen-- being transported to OR for trach.  Renal function worsening, BUN in the 130s   Objective:   BP 112/62 (BP Location: Left Arm)   Pulse 84   Temp 98.3 F (36.8 C) (Rectal)   Resp (!) 25   Ht _0  (1.88 m)   Wt 94.4 kg   SpO2 100%   BMI 26.72 kg/m   Intake/Output Summary (Last 24 hours) at 10/26/2020 1016 Last data filed at 11/03/2020 2952 Gross per 24 hour  Intake 4028.59 ml  Output 3870 ml  Net 158.59 ml   Weight change:   Physical Exam: Gen: ill-appearing on the vent CVS: tachycardic Resp: coarse mechanical Abd: distended, pancreatic drain in place, biliary drain, 2 JPs and a drain from the colostomy  stoma Ext: 2+ anasarca  Imaging: No results found.  Labs: BMET Recent Labs  Lab 10/12/20 0500 10/13/20 0519 10/14/20 0223 10/14/20 1121 10/15/20 0859 10/15/20 1353 10/15/20 1600 10/15/20 2214 10/16/20 0500 10/16/20 0853 10/30/2020 0426  NA 149* 148* 145   < > 143 146* 139 143 141 145 138  K 3.8 4.0 4.7   < > 5.0 5.5* 5.8* 4.8 4.4 4.3 4.1  CL 126* 123* 122*  --  118*  --  119* 117* 114*  --  112*  CO2 18* 17* 15*  --  16*  --  12* 15* 15*  --  15*  GLUCOSE 170* 184* 215*  --  308*  --  274* 109* 183*  --  233*  BUN 79* 79* 78*  --  102*  --  115* 121* 120*  --  130*  CREATININE 2.23* 2.22* 2.24*  --  3.10*  --  3.54* 3.68* 4.04*  --  4.45*  CALCIUM 8.6* 8.7* 8.7*  --  8.5*  --  8.1* 8.0* 7.7*  --  7.8*  PHOS 3.4 3.8  --   --   --   --  7.5*  --  8.0*  --  7.4*   < > = values in this interval not displayed.   CBC Recent Labs  Lab 10/13/20 0519 10/14/20 1121 10/15/20 0703 10/15/20 1353 10/16/20 0500 10/16/20 0853 10/18/2020 0426  WBC 11.1*  --  15.4*  --  11.0*  --  11.4*  NEUTROABS 8.9*  --   --   --   --   --   --   HGB 7.7*   < > 7.0* 7.8* 7.3* 7.8* 7.3*  HCT 25.7*   < > 26.0* 23.0* 23.7* 23.0* 23.2*  MCV 93.1  --  105.7*  --  91.9  --  91.0  PLT 237  --  283  --  242  --  252   < > = values in this interval not displayed.    Medications:    . [MAR Hold] chlorhexidine gluconate (MEDLINE KIT)  15 mL Mouth Rinse BID  . [MAR Hold] Chlorhexidine Gluconate Cloth  6 each Topical Daily  . [MAR Hold] insulin aspart  0-20 Units Subcutaneous Q4H  . [MAR Hold] insulin aspart  2 Units Subcutaneous Q4H  . [MAR Hold] ipratropium-albuterol  3 mL Nebulization Q6H  . [MAR Hold] mouth rinse  15 mL Mouth Rinse 10 times per day  . [MAR Hold] pantoprazole (PROTONIX) IV  40 mg Intravenous BID  . [MAR Hold] sodium chloride flush  10-40 mL Intracatheter Q12H      Madelon Lips, MD 11/04/2020, 10:16 AM

## 2020-10-18 ENCOUNTER — Inpatient Hospital Stay (HOSPITAL_COMMUNITY): Payer: Managed Care, Other (non HMO)

## 2020-10-18 DIAGNOSIS — J9601 Acute respiratory failure with hypoxia: Secondary | ICD-10-CM | POA: Diagnosis not present

## 2020-10-18 LAB — RENAL FUNCTION PANEL
Albumin: 1.2 g/dL — ABNORMAL LOW (ref 3.5–5.0)
Anion gap: 11 (ref 5–15)
BUN: 152 mg/dL — ABNORMAL HIGH (ref 6–20)
CO2: 21 mmol/L — ABNORMAL LOW (ref 22–32)
Calcium: 7.9 mg/dL — ABNORMAL LOW (ref 8.9–10.3)
Chloride: 111 mmol/L (ref 98–111)
Creatinine, Ser: 4.91 mg/dL — ABNORMAL HIGH (ref 0.61–1.24)
GFR, Estimated: 14 mL/min — ABNORMAL LOW (ref >60)
Glucose, Bld: 177 mg/dL — ABNORMAL HIGH (ref 70–99)
Phosphorus: 4.6 mg/dL (ref 2.5–4.6)
Potassium: 3.4 mmol/L — ABNORMAL LOW (ref 3.5–5.1)
Sodium: 143 mmol/L (ref 135–145)

## 2020-10-18 LAB — COMPREHENSIVE METABOLIC PANEL
ALT: 79 U/L — ABNORMAL HIGH (ref 0–44)
AST: 78 U/L — ABNORMAL HIGH (ref 15–41)
Albumin: 1.3 g/dL — ABNORMAL LOW (ref 3.5–5.0)
Alkaline Phosphatase: 85 U/L (ref 38–126)
Anion gap: 12 (ref 5–15)
BUN: 150 mg/dL — ABNORMAL HIGH (ref 6–20)
CO2: 18 mmol/L — ABNORMAL LOW (ref 22–32)
Calcium: 8.1 mg/dL — ABNORMAL LOW (ref 8.9–10.3)
Chloride: 110 mmol/L (ref 98–111)
Creatinine, Ser: 4.96 mg/dL — ABNORMAL HIGH (ref 0.61–1.24)
GFR, Estimated: 14 mL/min — ABNORMAL LOW (ref >60)
Glucose, Bld: 225 mg/dL — ABNORMAL HIGH (ref 70–99)
Potassium: 3.8 mmol/L (ref 3.5–5.1)
Sodium: 140 mmol/L (ref 135–145)
Total Bilirubin: 3.7 mg/dL — ABNORMAL HIGH (ref 0.3–1.2)
Total Protein: 6.9 g/dL (ref 6.5–8.1)

## 2020-10-18 LAB — CBC
HCT: 22 % — ABNORMAL LOW (ref 39.0–52.0)
Hemoglobin: 7 g/dL — ABNORMAL LOW (ref 13.0–17.0)
MCH: 27.7 pg (ref 26.0–34.0)
MCHC: 31.8 g/dL (ref 30.0–36.0)
MCV: 87 fL (ref 80.0–100.0)
Platelets: 275 10*3/uL (ref 150–400)
RBC: 2.53 MIL/uL — ABNORMAL LOW (ref 4.22–5.81)
RDW: 19.6 % — ABNORMAL HIGH (ref 11.5–15.5)
WBC: 13.3 10*3/uL — ABNORMAL HIGH (ref 4.0–10.5)
nRBC: 0.5 % — ABNORMAL HIGH (ref 0.0–0.2)

## 2020-10-18 LAB — PREPARE RBC (CROSSMATCH)

## 2020-10-18 LAB — GLUCOSE, CAPILLARY
Glucose-Capillary: 217 mg/dL — ABNORMAL HIGH (ref 70–99)
Glucose-Capillary: 185 mg/dL — ABNORMAL HIGH (ref 70–99)
Glucose-Capillary: 172 mg/dL — ABNORMAL HIGH (ref 70–99)
Glucose-Capillary: 164 mg/dL — ABNORMAL HIGH (ref 70–99)
Glucose-Capillary: 158 mg/dL — ABNORMAL HIGH (ref 70–99)
Glucose-Capillary: 174 mg/dL — ABNORMAL HIGH (ref 70–99)

## 2020-10-18 LAB — HEMOGLOBIN AND HEMATOCRIT, BLOOD
HCT: 24.3 % — ABNORMAL LOW (ref 39.0–52.0)
Hemoglobin: 7.7 g/dL — ABNORMAL LOW (ref 13.0–17.0)

## 2020-10-18 LAB — PHOSPHORUS: Phosphorus: 4.6 mg/dL (ref 2.5–4.6)

## 2020-10-18 LAB — MAGNESIUM: Magnesium: 2.5 mg/dL — ABNORMAL HIGH (ref 1.7–2.4)

## 2020-10-18 MED ORDER — SELENIOUS ACID 60 MCG/ML IV SOLN
INTRAVENOUS | Status: AC
  Filled 2020-10-18: qty 1238.4

## 2020-10-18 MED ORDER — PRISMASOL BGK 4/2.5 32-4-2.5 MEQ/L REPLACEMENT SOLN
Status: DC
  Filled 2020-10-18 (×4): qty 5000

## 2020-10-18 MED ORDER — PRISMASOL BGK 4/2.5 32-4-2.5 MEQ/L EC SOLN
Status: DC
  Filled 2020-10-18 (×33): qty 5000

## 2020-10-18 MED ORDER — PIPERACILLIN-TAZOBACTAM 3.375 G IVPB 30 MIN
3.3750 g | Freq: Four times a day (QID) | INTRAVENOUS | Status: DC
  Administered 2020-10-18 – 2020-10-19 (×4): 3.375 g via INTRAVENOUS
  Filled 2020-10-18 (×7): qty 50

## 2020-10-18 MED ORDER — FENTANYL CITRATE (PF) 100 MCG/2ML IJ SOLN
50.0000 ug | INTRAMUSCULAR | Status: DC | PRN
  Administered 2020-10-18: 50 ug via INTRAVENOUS
  Filled 2020-10-18: qty 2

## 2020-10-18 MED ORDER — SODIUM CHLORIDE 0.9 % FOR CRRT: INTRAVENOUS_CENTRAL | Status: DC | PRN

## 2020-10-18 MED ORDER — PRISMASOL BGK 4/2.5 32-4-2.5 MEQ/L REPLACEMENT SOLN
Status: DC
  Filled 2020-10-18 (×7): qty 5000

## 2020-10-18 MED ORDER — FENTANYL BOLUS VIA INFUSION
50.0000 ug | INTRAVENOUS | Status: DC | PRN
  Filled 2020-10-18: qty 100

## 2020-10-18 MED ORDER — SODIUM CHLORIDE 0.9% IV SOLUTION: Freq: Once | INTRAVENOUS | Status: AC

## 2020-10-18 MED ORDER — HEPARIN SODIUM (PORCINE) 1000 UNIT/ML DIALYSIS
1000.0000 [IU] | INTRAMUSCULAR | Status: DC | PRN
  Filled 2020-10-18: qty 6

## 2020-10-18 NOTE — Progress Notes (Signed)
[facility] Progress Note    Assessment/ Plan:   Assessment/Plan: Acute renal failure - he has reasons to be in ATN  With hypotensive episodes requiring Levophed in the setting of a very complicated surgical history with multiple visits to the OR + DIC + diffuse small bowel necrosis. Creatinine fluctuating represents compromised renal function with unfortunately repeated insults. No evidence of obstruction.  continued purulent drainage from JP drain in abdomen.  - remains non-oliguric but BUN/Cr rising.   - Pt and family desire all aggressive interventions, confirmed 10/16/20 - need to start CRRT- d/w PCCM/ CCS, line has been placed, we will start today, all 4K bath, no heparin for now with fresh trach yesterday - Avoid nephrotoxins and dose medications for a GFR < 15 ml/min.  2. Respiratory failure reintubated now on the ventilator--> s/p trach in OR 10/10/2020 3. Hyperkalemia: resolved, TPN being reformulated 4. Neuroendocrine tumor s/p Whipple with unfortunate extensive small bowel necrosis s/p resection.  Family is hopeful for a small bowel transplant, however this is quite a long ways off d/t multiple issues.  CT 4/6 showing possible pelvic abscess, has been started on Zosyn 5. Severe malnutrition on TPN. 6. Anemia   Subjective:    BUN 150s, still awake on vent, s/p trach yesterday.  Discussed need for CRRT today.  He is in agreement and he understands.  Willing to proceed.     Objective:   BP 104/65   Pulse 85   Temp 99.32 F (37.4 C)   Resp 20   Ht 6\' 2"  (1.88 m)   Wt 94.4 kg   SpO2 95%   BMI 26.72 kg/m   Intake/Output Summary (Last 24 hours) at 10/18/2020 1111 Last data filed at 10/18/2020 1100 Gross per 24 hour  Intake 3242.82 ml  Output 4770 ml  Net -1527.18 ml   Weight change:   Physical Exam: Gen: ill-appearing on the vent, awake and alert CVS: tachycardic Resp: coarse mechanical Abd: distended, pancreatic drain in place, biliary drain, 2 JPs and a  drain from the colostomy stoma Ext: 2+ anasarca  Imaging: DG CHEST PORT 1 VIEW  Result Date: 10/31/2020 CLINICAL DATA:  Tracheostomy placement EXAM: PORTABLE CHEST 1 VIEW COMPARISON:  10/14/2020 FINDINGS: Tracheostomy placed in good position.  No pneumothorax Right arm PICC tip in the SVC. Progression of bibasilar airspace disease right greater than left. AICD unchanged in position. IMPRESSION: Satisfactory tracheostomy placement Progression of bibasilar airspace disease right greater than left. Possible atelectasis versus pneumonia. Electronically Signed   By: Franchot Gallo M.D.   On: 10/13/2020 14:57    Labs: BMET Recent Labs  Lab 10/12/20 0500 10/13/20 0519 10/14/20 0223 10/14/20 1121 10/15/20 0859 10/15/20 1353 10/15/20 1600 10/15/20 2214 10/16/20 0500 10/16/20 0853 11/01/2020 0426 10/18/20 0606  NA 149* 148* 145   < > 143 146* 139 143 141 145 138 140  K 3.8 4.0 4.7   < > 5.0 5.5* 5.8* 4.8 4.4 4.3 4.1 3.8  CL 126* 123* 122*  --  118*  --  119* 117* 114*  --  112* 110  CO2 18* 17* 15*  --  16*  --  12* 15* 15*  --  15* 18*  GLUCOSE 170* 184* 215*  --  308*  --  274* 109* 183*  --  233* 225*  BUN 79* 79* 78*  --  102*  --  115* 121* 120*  --  130* 150*  CREATININE 2.23* 2.22* 2.24*  --  3.10*  --  3.54* 3.68* 4.04*  --  4.45* 4.96*  CALCIUM 8.6* 8.7* 8.7*  --  8.5*  --  8.1* 8.0* 7.7*  --  7.8* 8.1*  PHOS 3.4 3.8  --   --   --   --  7.5*  --  8.0*  --  7.4* 4.6   < > = values in this interval not displayed.   CBC Recent Labs  Lab 10/13/20 0519 10/14/20 1121 10/15/20 0703 10/15/20 1353 10/16/20 0500 10/16/20 0853 10/26/2020 0426 10/18/20 0606  WBC 11.1*  --  15.4*  --  11.0*  --  11.4* 13.3*  NEUTROABS 8.9*  --   --   --   --   --   --   --   HGB 7.7*   < > 7.0*   < > 7.3* 7.8* 7.3* 7.0*  HCT 25.7*   < > 26.0*   < > 23.7* 23.0* 23.2* 22.0*  MCV 93.1  --  105.7*  --  91.9  --  91.0 87.0  PLT 237  --  283  --  242  --  252 275   < > = values in this interval not  displayed.    Medications:    . chlorhexidine  15 mL Mouth Rinse BID  . Chlorhexidine Gluconate Cloth  6 each Topical Daily  . insulin aspart  0-20 Units Subcutaneous Q4H  . insulin aspart  2 Units Subcutaneous Q4H  . mouth rinse  15 mL Mouth Rinse q12n4p  . pantoprazole (PROTONIX) IV  40 mg Intravenous BID  . sodium chloride flush  10-40 mL Intracatheter Q12H      Madelon Lips, MD 10/18/2020, 11:11 AM

## 2020-10-18 NOTE — Progress Notes (Signed)
[name], MRN:  [id number], DOB:  May 17, 1971, LOS: 40 ADMISSION DATE:  10/08/2020, CONSULTATION DATE:  10/18/20 REFERRING MD:  Anesthesia, CHIEF COMPLAINT:  Whipple, post-op shock   Brief History:  50 y.o. M with PMH of neuroendocrine tumor and underwent Whipple procedure 3/14 with intra-operative SMV laceration and repair by vascular with bovine pericardial patch.  Pt had worsening shock overnight and required three pressors despite massive transfusion protocol (received 150 blood products).  He was taken back to the OR for washout on 3/15 and returned to the ICU intubated.  Taken back 3/17 again found to have small bowel necrosis s/p resection.  Plans again to return 3/19 to OR.   Totality of small bowel has been resected. The case was discussed with [facility] who may consider eval for sm intestinal transplant pending clinical course.  Continuing aggressive supportive care.   Past Medical History:   has a past medical history of AICD (automatic cardioverter/defibrillator) present, Dyslipidemia, Heart failure with mildly reduced EF, History of kidney stones, HTN (hypertension), ventricular fibrillation, and Nonischemic cardiomyopathy.  Significant Hospital Events:  3/14: Admit to Surgery, to OR for whipple, SMV injury and repair, massive transfusion protocol overnight  3/15: Back to OR for washout, x2. IR for arteriogram. No major bleed source identified in OR, or arterial bleed w IR. Robust product resuscitation >75 products (+ TXA + novoseven) 3/16: off pressors, add'l 39 products overnight. Slowing resuscitation this morning with hemodynamic improvements and slowing drain output; OR x 2 - washout, biliary t tube, wound vac -- washout, wound vac, IR for arteriogram -- no arterial bleed for embolization 3/17:  Aggressive balanced transfusion continue overnight with marked hemodynamic improvement since yesterday evening. Off pressors x several hours, Wound vac output significantly slowing  (from 530ml q15-66min to 565ml q1hr+) and output is much thinner, Thin bloody secretions from mouth approx 258ml, Decreased RR from 22 to 18, Unable to tolerate NE- severe bradycardia 3/19 taken back to OR. Small bowel noted to be almost completely necrosed. Patient was closed with plans for goals of car discussion.  3/20 GOC family endorses full scope of treatment. Primary team discussed with [facility] the possibility of transfer for small bowel transplant. [facility] felt as though transfer would not change outcome. 3/21 Ongoing discussion with family and [facility]. Patient may be a candidate for transplant if he can stabilize post a small bowel resection. He was taken back to the OR and small bowel was resected.  3/23 weaning pressors. Versed off. Remains encephalopathic 3/24 off pressors. Low dose dilaudid gtt -- only weakly grimaces to pain. Changing sedation to precedex + PRN fentanyl. Long discussion with 2 brothers regarding clinical case -- tried to clarify that while the term "stable" has been used, in this instance is meaning that he has not declined from previous shift but is in fact still critically ill with multisystem organ dysfunction.  3/25 Cr and BUN increased. Off sedation  3/26 Pressors off, CT head benign, BUN/Cr continue to creep up 3/27 Tachycardia/HTN overnight improved with fentanyl gtt 2/28 PCCM sign off. Trauma to take over vent/CCM needs.  3/30 extubated 4/2 desaturated overnight to the 30s improved with BVM and NTS.  4/5 poor airway protection. PCCM consulted for re-intubation.  4/6 CT a/p Large RLL opacity, smaller LLL opacity. Post op changes. 5.4cm fluid collection in posterior pelvis, concerning for abscess. Started on Zosyn  4/7 got transfused overnight and then diuresed. Worse renal function 4/9 transfused again. HD line inserted and started on  CRRT  Consults:  PCCM VVS  IR  Palliative  Procedures:  PICC 3/25 > JP x 2  Biliary drain Pancreatic stent drain ETT  2/24>>3/13; 3/15 >> 3/29;  4/5 >   Significant Diagnostic Tests:  See Radiology tab   Most recent significant exam was CT a/p 4/6: which revealed R>L pulmonary opacities, and pelvic fluid collection concerning for abscess as well as detailing the numerous post-op changes.   Micro Data:  See micro tab 4/5 trach asp >> Gram neg coccobacilli   Antimicrobials:  Zosyn 3/21 > 3/29; 4/5 >> Eraxis 3/23> 3/29  Interim History / Subjective:   Uneventful tracheostomy yesterday. Tolerating SBT this morning. Episodes of nausea with emesis of clear secretions.  Creatinine continues to climb - Dr Hollie Salk requesting to start CRRT.   Objective   Blood pressure 113/66, pulse 86, temperature 99.32 F (37.4 C), resp. rate (!) 25, height 6\' 2"  (1.88 m), weight 94.4 kg, SpO2 95 %.    Vent Mode: PSV;CPAP FiO2 (%):  [30 %] 30 % Set Rate:  [24 bmp] 24 bmp Vt Set:  [650 mL] 650 mL PEEP:  [5 cmH20] 5 cmH20 Pressure Support:  [5 cmH20-10 cmH20] 10 cmH20 Plateau Pressure:  [22 cmH20-27 cmH20] 22 cmH20   Intake/Output Summary (Last 24 hours) at 10/18/2020 1041 Last data filed at 10/18/2020 0800 Gross per 24 hour  Intake 2852.6 ml  Output 3995 ml  Net -1142.4 ml   Filed Weights   09/12/2020 0639 10/01/20 1503 10/15/20 1503  Weight: 95.3 kg 124.9 kg 94.4 kg   Examination:  General:  Obese man in no distress.  HEENT: [place]. Tracheostomy in place.  Mild scleral icterus, mucus membranes are moist. Neuro: Awakens to voice, follows commands. Generalized weakness but moves all limbs.  CV: rr with some PVCs. Cap refill < 3 seconds. S1s2, JVP visible at 3cm ASA PULM:  Symmetrical chest expansion, chest clear bilaterally GI: Round, mild tenderness localized to surgical site. Multiple drains-- JPs with turbid fluid. VAC dressing in place. Extremities: Non pitting edema. No cyanosis or clubbing  Skin: c/d/w no rash   Resolved Hospital Problem list   Hemorrhagic shock hypophosphatemia  Assessment & Plan:    Critically ill due to acute hypoxemic respiratory failure requiring mechanical ventilation. due to generalized weakness and possible aspiration pneumonia -Continue daily SBT - trach collar today for 2h intervals bid with rest on full support in between. -Complete 7 days of Zosyn  Pancreatic neuroendocrine tumor s/p whipple 3/21 b/c hemorrhagic shock, c/b small bowel necrosis now s/p total small bowel resection and externa; drainage for bile duct pancreatic duct and stomach.  - Continue TPN - Will need tunneled access for TPN to decrease infection risk.  - Family's continued hope is for transfer to [facility] for small bowel transplant. Suspect pt has quite a ways to go before stable enough to be considered, and am unsure if underlying conditions like NICM would preclude candidacy. Defer these conversations to CCS   Critically ill due to septic shock requiring titration of NE, suspect septic in setting of PNA and possible pelvic abscess.  - Weaned off NE yesterday.  - Discontinue arterial line tomorrow if remains off pressors.     AKI, worse since reintubation - possible hypotensive hit - Will start CRRT today.  - Nausea may be result of uremia.   NAGMA due to AKI -Increased acetate in TPN -Started bicarb infusion -should correct with CRRT  Transaminitis, mild due to TPN - follow as improving.  Hyperglycemia due to stress hyperglycemia and TPN - rSSI   Anemia of critical illness  - follow CBC - Transfuse for hgb < 7.   NICM s/p ICD -Well compensated at this time.   [place] Suspect poor prognosis.  The family hopes for a small intestine transplant -- I think there are currently several acute barriers before the pt could even possibly be considered for potential candidacy   Daily Goals Checklist  Pain/Anxiety/Delirium protocol (if indicated): PRN only for RASS goal 0 to -1 VAP protocol (if indicated): bundle in place.  DVT prophylaxis: SCD Nutrition Status: TPN - needs tunneled  line.  GI prophylaxis: Pantoprazole Urinary catheter: external Central lines: PICC 3/25 Glucose control: -SSI + TPN Mobility/therapy needs: Mobilize to chair if possible Restraints: Restraint type:NA Reason for restraints:NA Daily labs: CBC, CMP daily Code Status: Partial - -no CV/DF (has an ICD however) Family Communication: per primary Disposition: ICU.  Goals of Care:  Last date of multidisciplinary goals of care discussion: 23/5 Family and staff present: Dr. Zenia Resides Summary of discussion: Family wishes for aggressive care.   Code Status:  Partial - no CV/DF by [place] (has an ICD which is not to be turned off)  CRITICAL CARE Performed by: Kipp Brood   Total critical care time: 40 minutes  Critical care time was exclusive of separately billable procedures and treating other patients. Critical care was necessary to treat or prevent imminent or life-threatening deterioration.  Critical care was time spent personally by me on the following activities: development of treatment plan with patient and/or surrogate as well as nursing, discussions with consultants, evaluation of patient's response to treatment, examination of patient, obtaining history from patient or surrogate, ordering and performing treatments and interventions, ordering and review of laboratory studies, ordering and review of radiographic studies, pulse oximetry and re-evaluation of patient's condition.  Kipp Brood, MD [facility] ICU Physician [place]  Pager: [phone] Or Epic Secure Chat After hours: [phone].  10/18/2020, 10:41 AM

## 2020-10-18 NOTE — Progress Notes (Addendum)
PHARMACY - TOTAL PARENTERAL NUTRITION CONSULT NOTE  Indication:  Short bowel syndrome  Patient Measurements: Height: 6\' 2"  (188 cm) Weight: 124.9 kg (275 lb 5.7 oz) IBW/kg (Calculated) : 82.2 TPN AdjBW (KG): 95.3 Body mass index is 35.35 kg/m.  Assessment:  21 YOM with history of mass adjacent to head of the pancreas in 2021. 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  Developed cholecystitis in Feb 2022 and had percutaneous cholecystectomy tube placement.  Presented this admission for neuroendocrine tumor resection with Whipple procedure, also had right total colectomy, colostomy and cholecystectomy on 10/09/2020. SMV was lacerated during procedure and repaired by bovine pericardial patch. Developed hemorrhagic shock with bile leak post-op and underwent placement of biliary T tube with abdominal washout on 3/15 PM. Abdomen remains open. Pharmacy consulted to manage TPN.  Glucose / Insulin: no hx DM, A1c 5.2%. CBGs elevated on TPN (was in the 100s several days ago).  Utilized 40 units SSI in the past 24h hours, 50 units in TPN, Novolog 2 units Q4H (10 units in past 24 hrs, hold for CBG < 150).   Electrolytes: CO2 up to 18 (max acetate in TPN), CoCa WNL at 10.26 (iCa 1.16 low normal on 4/7 - none in TPN), Mag slightly high at 2.5 (none in TPN), others WNL Renal: AKI - SCr up to 4.96 (BL ~1-1.2), BUN up to 150 Hepatic: LFTs mildly elevated, tbili 3.7 (scleral yellowing still noted per RN 4/3) Albumin 1.3, prealbumin 25.1 > 13.3 (4/4) TG peaked at 740, now at 363 on 4/7 - Intralipids 100g/wk and monitor TG trends Intake / Output; MIVF: UOP 1.2 ml/kg/hr, drain (4 total): 158mL, 0 cc from gastrostomy, net -11.7L GI Imaging:  4/6 CT - pelvic fluid collection concerning for abscess GI Surgeries / Procedures:  3/14 Whipple procedure with R hemicolectomy and end ileostomy, cholecystectomy 3/15 washout, VAC placement 3/17 washout, VAC replacement.  Necrosis of entire small  bowel except for the PB limb and proximal 40-50cm of jejunum 3/19 re-opening laparotomy, abd closure.  Necrotic SB. 3/21 SB resection, take down of choledochojejunal anastomosis and pancreaticojejunal anastomosis, take down of duodenojejunal anastomosis, placement of externalized biliary drain / Stamm gastrostomy tube, IA washout and placement of intraperitoneal drains 3/30 extubation 4/8 trach  Central access: R IJ CVC placed 3/15; changed to PICC placed 10/03/20 TPN start date: 09/16/2020  Nutritional Goals (RD rec on 3/30): 2800-3000 kCal, 185-210g AA, fluid >2L/day  Current Nutrition:  TPN  Plan:  - Continue concentrated TPN at 90 ml/hr - Intralipid 20% 255mL Mon/Thurs for now to prevent EFAD - TPN with Intralipid 2x/wk provides a daily average of 185g AA and 2869 kCal, meeting 100% of patient needs.  - Electrolytes in TPN: Na 20mEq/L, no K / Phos, add Ca 75mEq/L, remove Mag, max acetate - Daily multivitamin in TPN.  Remove standard trace elements and add back zinc 5mg , chromium 20mcg, selenium 63mcg - Continue resistant SSI Q4H, 50 units regular insulin in TPN, Novolog 2 units SQ Q6H.  CBGs in the 100s several days ago, increase insulin in TPN if CBGs remain elevated - Bicarb gtt at 50 ml/hr for acidosis despite max acetate in TPN - Standard TPN labs and TG on Mon/Thurs  [name] D. Mina Marble, PharmD, BCPS, [place] 10/18/2020, 7:30 AM   ========================  Addendum: Starting CRRT Will remove chromium from TPN tomorrow since order already entered for today Change Zosyn to 3.375gm IV Q6H - 30 min infusion  Cornelious Diven D. Mina Marble, PharmD, BCPS, [place]  10/18/2020, 11:22 AM

## 2020-10-18 NOTE — Progress Notes (Signed)
1 Day Post-Op  Subjective: BUN up to 150 today. Afebrile. Levo at 2 with MAP of 80. UOP remains high. Trach done yesterday, patient is alert this morning. Tolerated PSV trial yesterday afternoon, on PRVC this morning.    Objective: Vital signs in last 24 hours: Temp:  [98.3 F (36.8 C)-99.86 F (37.7 C)] 99.32 F (37.4 C) (04/09 0600) Pulse Rate:  [78-92] 81 (04/09 0600) Resp:  [15-25] 24 (04/09 0600) BP: (94-130)/(49-72) 123/66 (04/09 0600) SpO2:  [95 %-100 %] 95 % (04/09 0600) Arterial Line BP: (91-152)/(44-67) 152/64 (04/09 0600) FiO2 (%):  [30 %] 30 % (04/09 0259) Last BM Date:  (pta)  Intake/Output from previous day: 04/08 0701 - 04/09 0700 In: 3394 [I.V.:3252.2; IV Piggyback:141.9] Out: 4193 [Urine:2800; Drains:1595; Blood:10] Intake/Output this shift: No intake/output data recorded.  PE: General: no acute distress Neuro: awake, follows commands HEENT: trach in place, site clean and dry. Scleral icterus. Resp: normal work of breathing Vent Mode: PRVC FiO2 (%):  [30 %] 30 % Set Rate:  [24 bmp] 24 bmp Vt Set:  [650 mL] 650 mL PEEP:  [5 cmH20] 5 cmH20 Pressure Support:  [5 cmH20] 5 cmH20 Plateau Pressure:  [22 XTK24-09 cmH20] 22 cmH20 CV: RRR Abdomen: soft, nondistended, wound vac in place on midline incision. JPx2 with milky fluid, likely pancreatic. Biliary drain with bile. LUQ pancreatic stent with clear colorless fluid. G tube in place to gravity LUQ.   Lab Results:  Recent Labs    10/13/2020 0426 10/18/20 0606  WBC 11.4* 13.3*  HGB 7.3* 7.0*  HCT 23.2* 22.0*  PLT 252 275   BMET Recent Labs    11/03/2020 0426 10/18/20 0606  NA 138 140  K 4.1 3.8  CL 112* 110  CO2 15* 18*  GLUCOSE 233* 225*  BUN 130* 150*  CREATININE 4.45* 4.96*  CALCIUM 7.8* 8.1*   PT/INR No results for input(s): LABPROT, INR in the last 72 hours. CMP     Component Value Date/Time   NA 140 10/18/2020 0606   NA 142 07/21/2020 0921   K 3.8 10/18/2020 0606   CL 110  10/18/2020 0606   CO2 18 (L) 10/18/2020 0606   GLUCOSE 225 (H) 10/18/2020 0606   BUN 150 (H) 10/18/2020 0606   BUN 16 07/21/2020 0921   CREATININE 4.96 (H) 10/18/2020 0606   CREATININE 1.16 03/01/2016 1607   CALCIUM 8.1 (L) 10/18/2020 0606   PROT 6.9 10/18/2020 0606   PROT 6.7 07/21/2020 0921   ALBUMIN 1.3 (L) 10/18/2020 0606   ALBUMIN 4.1 07/21/2020 0921   AST 78 (H) 10/18/2020 0606   ALT 79 (H) 10/18/2020 0606   ALKPHOS 85 10/18/2020 0606   BILITOT 3.7 (H) 10/18/2020 0606   BILITOT 0.8 07/21/2020 0921   GFRNONAA 14 (L) 10/18/2020 0606   GFRNONAA >89 09/22/2015 1008   GFRAA 82 07/21/2020 0921   GFRAA >89 09/22/2015 1008   Lipase     Component Value Date/Time   LIPASE 23 08/22/2020 0318       Studies/Results: DG CHEST PORT 1 VIEW  Result Date: 10/21/2020 CLINICAL DATA:  Tracheostomy placement EXAM: PORTABLE CHEST 1 VIEW COMPARISON:  10/14/2020 FINDINGS: Tracheostomy placed in good position.  No pneumothorax Right arm PICC tip in the SVC. Progression of bibasilar airspace disease right greater than left. AICD unchanged in position. IMPRESSION: Satisfactory tracheostomy placement Progression of bibasilar airspace disease right greater than left. Possible atelectasis versus pneumonia. Electronically Signed   By: Franchot Gallo M.D.  On: 10/16/2020 14:57    Assessment/Plan 50 yo male s/p Whipple, right colectomy and SMV repair for T3N0 neuroendocrine tumor. C/b postop hemorrhagic shock with DIC and subsequent diffuse small bowel necrosis, now s/p small bowel resection with external drainage of bile duct, pancreatic duct and stomach. - Continue abx for Klebsiella pneumonia - Worsening AKI with significantly elevated BUN. Will discuss with nephrology if RRT is indicated today. Patient will need line placed for HD. - Continue TPN - Defer vent management to CCM, hopefully trach will assist patient in weaning from vent - VTE: SQH - Dispo: ICU  I had a discussion with the  patient's brother and wife yesterday afternoon. I explained that he now has multiorgan dysfunction, with a worsening AKI and will likely need dialysis soon. I discussed that I am not very optimistic that RRT will change his overall outcome, but I think it would be reasonable to proceed for a period of time to see if he improves. Brother reports they will discuss with the rest of their family, but they are ok with him getting dialysis if necessary. I again emphasized that without any remaining small bowel, the patient is at high risk for many complications.   LOS: 26 days    Michaelle Birks, MD [facility] Surgery General, Hepatobiliary and Pancreatic Surgery 10/18/20 7:44 AM

## 2020-10-18 NOTE — Procedures (Signed)
Central Venous Catheter Insertion Procedure Note  [name]  [id number]  April 21, 1971  Date:10/18/20  Time:10:38 AM   Provider Performing:Cherelle Midkiff   Procedure: Insertion of Non-tunneled Central Venous Catheter(36556)with US guidance (32761)    Indication(s) Hemodialysis  Consent Risks of the procedure as well as the alternatives and risks of each were explained to the patient and/or caregiver.  Consent for the procedure was obtained and is signed in the bedside chart  Anesthesia Topical only with 1% lidocaine  versed 2mg .   Timeout Verified patient identification, verified procedure, site/side was marked, verified correct patient position, special equipment/implants available, medications/allergies/relevant history reviewed, required imaging and test results available.  Sterile Technique Maximal sterile technique including full sterile barrier drape, hand hygiene, sterile gown, sterile gloves, mask, hair covering, sterile ultrasound probe cover (if used).  Procedure Description Area of catheter insertion was cleaned with chlorhexidine and draped in sterile fashion.   With real-time ultrasound guidance a 13 F 15 cm Power Trialysis HD catheter was placed into the right internal jugular vein.  Nonpulsatile blood flow and easy flushing noted in all ports.  The catheter was sutured in place and sterile dressing applied.     Complications/Tolerance None; patient tolerated the procedure well. Chest X-ray is ordered to verify placement for internal jugular or subclavian cannulation.  Chest x-ray is not ordered for femoral cannulation.  EBL Minimal  Kipp Brood, MD [facility] ICU Physician [place]  Pager: [phone] Or Epic Secure Chat After hours: [phone].  10/18/2020, 10:40 AM

## 2020-10-19 DIAGNOSIS — J9601 Acute respiratory failure with hypoxia: Secondary | ICD-10-CM | POA: Diagnosis not present

## 2020-10-19 LAB — COMPREHENSIVE METABOLIC PANEL
ALT: 84 U/L — ABNORMAL HIGH (ref 0–44)
AST: 95 U/L — ABNORMAL HIGH (ref 15–41)
Albumin: 1.3 g/dL — ABNORMAL LOW (ref 3.5–5.0)
Alkaline Phosphatase: 87 U/L (ref 38–126)
Anion gap: 8 (ref 5–15)
BUN: 78 mg/dL — ABNORMAL HIGH (ref 6–20)
CO2: 25 mmol/L (ref 22–32)
Calcium: 7.7 mg/dL — ABNORMAL LOW (ref 8.9–10.3)
Chloride: 106 mmol/L (ref 98–111)
Creatinine, Ser: 2.57 mg/dL — ABNORMAL HIGH (ref 0.61–1.24)
GFR, Estimated: 30 mL/min — ABNORMAL LOW (ref >60)
Glucose, Bld: 167 mg/dL — ABNORMAL HIGH (ref 70–99)
Potassium: 3 mmol/L — ABNORMAL LOW (ref 3.5–5.1)
Sodium: 139 mmol/L (ref 135–145)
Total Bilirubin: 3.8 mg/dL — ABNORMAL HIGH (ref 0.3–1.2)
Total Protein: 7.2 g/dL (ref 6.5–8.1)

## 2020-10-19 LAB — TYPE AND SCREEN
ABO/RH(D): O NEG
Antibody Screen: NEGATIVE
Unit division: 0
Unit division: 0

## 2020-10-19 LAB — RENAL FUNCTION PANEL
Albumin: 1.4 g/dL — ABNORMAL LOW (ref 3.5–5.0)
Anion gap: 7 (ref 5–15)
BUN: 54 mg/dL — ABNORMAL HIGH (ref 6–20)
CO2: 27 mmol/L (ref 22–32)
Calcium: 7.8 mg/dL — ABNORMAL LOW (ref 8.9–10.3)
Chloride: 100 mmol/L (ref 98–111)
Creatinine, Ser: 1.87 mg/dL — ABNORMAL HIGH (ref 0.61–1.24)
GFR, Estimated: 44 mL/min — ABNORMAL LOW (ref >60)
Glucose, Bld: 206 mg/dL — ABNORMAL HIGH (ref 70–99)
Phosphorus: 2.8 mg/dL (ref 2.5–4.6)
Potassium: 3.4 mmol/L — ABNORMAL LOW (ref 3.5–5.1)
Sodium: 134 mmol/L — ABNORMAL LOW (ref 135–145)

## 2020-10-19 LAB — CBC
HCT: 25.6 % — ABNORMAL LOW (ref 39.0–52.0)
Hemoglobin: 8.3 g/dL — ABNORMAL LOW (ref 13.0–17.0)
MCH: 27.9 pg (ref 26.0–34.0)
MCHC: 32.4 g/dL (ref 30.0–36.0)
MCV: 86.2 fL (ref 80.0–100.0)
Platelets: 270 10*3/uL (ref 150–400)
RBC: 2.97 MIL/uL — ABNORMAL LOW (ref 4.22–5.81)
RDW: 18.6 % — ABNORMAL HIGH (ref 11.5–15.5)
WBC: 10.3 10*3/uL (ref 4.0–10.5)
nRBC: 0.7 % — ABNORMAL HIGH (ref 0.0–0.2)

## 2020-10-19 LAB — GLUCOSE, CAPILLARY
Glucose-Capillary: 150 mg/dL — ABNORMAL HIGH (ref 70–99)
Glucose-Capillary: 158 mg/dL — ABNORMAL HIGH (ref 70–99)
Glucose-Capillary: 170 mg/dL — ABNORMAL HIGH (ref 70–99)
Glucose-Capillary: 171 mg/dL — ABNORMAL HIGH (ref 70–99)
Glucose-Capillary: 172 mg/dL — ABNORMAL HIGH (ref 70–99)
Glucose-Capillary: 182 mg/dL — ABNORMAL HIGH (ref 70–99)

## 2020-10-19 LAB — BPAM RBC
Blood Product Expiration Date: 202205062359
Blood Product Expiration Date: 202205102359
ISSUE DATE / TIME: 202204061638
ISSUE DATE / TIME: 202204090938
Unit Type and Rh: 5100
Unit Type and Rh: 5100

## 2020-10-19 LAB — MAGNESIUM: Magnesium: 2.1 mg/dL (ref 1.7–2.4)

## 2020-10-19 LAB — PHOSPHORUS: Phosphorus: 1.8 mg/dL — ABNORMAL LOW (ref 2.5–4.6)

## 2020-10-19 MED ORDER — POTASSIUM CHLORIDE 10 MEQ/100ML IV SOLN
10.0000 meq | INTRAVENOUS | Status: DC
  Administered 2020-10-19 (×2): 10 meq via INTRAVENOUS
  Filled 2020-10-19 (×4): qty 100

## 2020-10-19 MED ORDER — PIPERACILLIN-TAZOBACTAM 3.375 G IVPB 30 MIN
3.3750 g | Freq: Four times a day (QID) | INTRAVENOUS | Status: DC
  Administered 2020-10-19 – 2020-10-21 (×7): 3.375 g via INTRAVENOUS
  Filled 2020-10-19 (×15): qty 50

## 2020-10-19 MED ORDER — ZINC CHLORIDE 1 MG/ML IV SOLN
INTRAVENOUS | Status: DC
  Filled 2020-10-19 (×3): qty 1246.4

## 2020-10-19 MED ORDER — POTASSIUM PHOSPHATES 15 MMOLE/5ML IV SOLN
25.0000 mmol | Freq: Once | INTRAVENOUS | Status: AC
  Administered 2020-10-19: 25 mmol via INTRAVENOUS
  Filled 2020-10-19: qty 8.33

## 2020-10-19 MED ORDER — HEPARIN SODIUM (PORCINE) 5000 UNIT/ML IJ SOLN
5000.0000 [IU] | Freq: Three times a day (TID) | INTRAMUSCULAR | Status: DC
  Administered 2020-10-19 – 2020-10-21 (×6): 5000 [IU] via SUBCUTANEOUS
  Filled 2020-10-19 (×7): qty 1

## 2020-10-19 MED ORDER — HEPARIN SODIUM (PORCINE) 1000 UNIT/ML DIALYSIS
1000.0000 [IU] | INTRAMUSCULAR | Status: DC | PRN
  Administered 2020-10-20: 2400 [IU] via INTRAVENOUS_CENTRAL
  Filled 2020-10-19: qty 3
  Filled 2020-10-19 (×2): qty 6

## 2020-10-19 MED ORDER — ZINC CHLORIDE 1 MG/ML IV SOLN
INTRAVENOUS | Status: AC
  Filled 2020-10-19: qty 1246.4

## 2020-10-19 NOTE — Progress Notes (Signed)
PHARMACY - TOTAL PARENTERAL NUTRITION CONSULT NOTE  Indication:  Short bowel syndrome  Patient Measurements: Height: 6\' 2"  (188 cm) Weight: 124.9 kg (275 lb 5.7 oz) IBW/kg (Calculated) : 82.2 TPN AdjBW (KG): 95.3 Body mass index is 35.35 kg/m.  Assessment:  12 YOM with history of mass adjacent to head of the pancreas in 2021. 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  Developed cholecystitis in Feb 2022 and had percutaneous cholecystectomy tube placement.  Presented this admission for neuroendocrine tumor resection with Whipple procedure, also had right total colectomy, colostomy and cholecystectomy on 09/30/2020. SMV was lacerated during procedure and repaired by bovine pericardial patch. Developed hemorrhagic shock with bile leak post-op and underwent placement of biliary T tube with abdominal washout on 3/15 PM. Abdomen remains open. Pharmacy consulted to manage TPN.  Glucose / Insulin: no hx DM, A1c 5.2%. CBGs controlled.  Utilized 33 units SSI in the past 24h hours, 50 units in TPN, Novolog 2 units Q4H (hold for CBG < 150).   Electrolytes: K 3.8 > 3, Phos 4.6 > 1.8, CO2 normalized (max acetate in TPN), CoCa down to 9.86 (iCa 1.16 low normal on 4/7), others WNL Renal: CRRT started 4/9, 4K bath (BL 1-1.2), BUN down to 78 Hepatic: LFTs mildly elevated, tbili 3.8 (scleral yellowing still noted per RN 4/3) Albumin 1.3, prealbumin 25.1 > 13.3 (4/4) TG peaked at 740, now at 363 on 4/7 - Intralipids 100g/wk and monitor TG trends Intake / Output; MIVF: UOP 1.2 ml/kg/hr, drain (4 total): 1564mL, 0 cc from gastrostomy, net -11.7L GI Imaging:  4/6 CT - pelvic fluid collection concerning for abscess GI Surgeries / Procedures:  3/14 Whipple procedure with R hemicolectomy and end ileostomy, cholecystectomy 3/15 washout, VAC placement 3/17 washout, VAC replacement.  Necrosis of entire small bowel except for the PB limb and proximal 40-50cm of jejunum 3/19 re-opening  laparotomy, abd closure.  Necrotic SB. 3/21 SB resection, take down of choledochojejunal anastomosis and pancreaticojejunal anastomosis, take down of duodenojejunal anastomosis, placement of externalized biliary drain / Stamm gastrostomy tube, IA washout and placement of intraperitoneal drains 3/30 extubation 4/8 trach  Central access: R IJ CVC placed 3/15; changed to PICC placed 10/03/20 TPN start date: 10/06/2020  Nutritional Goals (RD rec on 3/30): 2800-3000 kCal, 185-210g AA, fluid >2L/day  Current Nutrition:  TPN  Plan:  - Adjust TPN to provide volume for electrolytes adjustments - Concentrated TPN at 95 ml/hr - Intralipid 20% 254mL Mon/Thurs for now to prevent EFAD - TPN with Intralipid 2x/wk provides a daily average of 187g AA and 2869 kCal, meeting 100% of patient needs.  - Electrolytes in TPN: Na 22mEq/L, add K 39mEq/L, Ca 13mEq/L, add back Mag 40mEq/L, add Phos 48mmol/L, max acetate - Daily multivitamin in TPN.  Remove standard trace elements and add back zinc 5mg , and selenium 43mcg.  Remove chromium while on RRT. - Continue resistant SSI Q4H, 50 units regular insulin in TPN, Novolog 2 units SQ Q6H. - Bicarb gtt at 50 ml/hr per MD - Discussed with Renal, give KPhos 25 mmol IV and KCL x 2 runs, add lytes to TPN - Standard TPN labs and TG on Mon/Thurs  Leilah Polimeni D. Mina Marble, PharmD, BCPS, [place] 10/19/2020, 10:56 AM

## 2020-10-19 NOTE — Progress Notes (Signed)
[facility] Progress Note    Assessment/ Plan:   Assessment/Plan: 1.  Acute renal failure - he has reasons to be in ATN  With hypotensive episodes requiring Levophed in the setting of a very complicated surgical history with multiple visits to the OR + DIC + diffuse small bowel necrosis. Creatinine fluctuating represents compromised renal function with unfortunately repeated insults. No evidence of obstruction.  continued purulent drainage from JP drain in abdomen.  - remains non-oliguric but BUN/Cr rising.   - Pt and family desire all aggressive interventions, confirmed 10/16/20 - started CRRT 10/18/20- d/w PCCM/ CCS, all 4K bath, no heparin - d/w pharmacy K of 3.0--> giving Kphos as supplement today and reformulating TPN to include K and Phos now that on CRRT - Avoid nephrotoxins and dose medications for a GFR < 15 ml/min.  2. Respiratory failure reintubated now on the ventilator--> s/p trach in OR 10/31/2020  3. Neuroendocrine tumor s/p Whipple with unfortunate extensive small bowel necrosis s/p resection.  Family is hopeful for a small bowel transplant, however this is quite a long ways off d/t multiple issues.  CT 4/6 showing possible pelvic abscess, has been started on Zosyn  4. Severe malnutrition on TPN.  5. Anemia- Hgb 8.3   Subjective:    CRRT started yesterday, K 3.0 this AM on 4K bath.  On TC this AM.     Objective:   BP 116/70   Pulse 72   Temp 97.7 F (36.5 C)   Resp 15   Ht 6\' 2"  (1.88 m)   Wt 103.2 kg   SpO2 99%   BMI 29.21 kg/m   Intake/Output Summary (Last 24 hours) at 10/19/2020 1122 Last data filed at 10/19/2020 1100 Gross per 24 hour  Intake 4121.8 ml  Output 4571 ml  Net -449.2 ml   Weight change:   Physical Exam: Gen: ill-appearing on the vent, awake and alert CVS: tachycardic Resp: coarse mechanical Abd: distended, pancreatic drain in place, biliary drain, 2 JPs and a drain from the colostomy stoma Ext: 2+ anasarca  Imaging: DG  Chest 1 View  Result Date: 10/18/2020 CLINICAL DATA:  Chronic ventilator dependent respiratory failure. Bedside central venous catheter placement. EXAM: PORTABLE CHEST 1 VIEW COMPARISON:  11/03/2020 and earlier. FINDINGS: The new RIGHT jugular central venous catheter tip projects over the mid SVC. No evidence of pneumothorax or mediastinal hematoma. RIGHT arm PICC tip projects over the cavoatrial junction, unchanged. Tracheostomy tube tip in satisfactory position above the carina. LEFT subclavian pacing defibrillator unchanged and appears intact. Cardiac silhouette upper normal in size to slightly enlarged for AP portable technique. Improved aeration in the lung bases since yesterday, with mild atelectasis persisting. No new pulmonary parenchymal abnormalities. IMPRESSION: 1. New RIGHT jugular central venous catheter tip projects over the mid SVC. No acute complicating features. 2. Remaining support apparatus satisfactory. 3. Improved aeration in the lung bases with mild atelectasis persisting. No new abnormalities. Electronically Signed   By: Evangeline Dakin M.D.   On: 10/18/2020 11:41   DG CHEST PORT 1 VIEW  Result Date: 10/16/2020 CLINICAL DATA:  Tracheostomy placement EXAM: PORTABLE CHEST 1 VIEW COMPARISON:  10/14/2020 FINDINGS: Tracheostomy placed in good position.  No pneumothorax Right arm PICC tip in the SVC. Progression of bibasilar airspace disease right greater than left. AICD unchanged in position. IMPRESSION: Satisfactory tracheostomy placement Progression of bibasilar airspace disease right greater than left. Possible atelectasis versus pneumonia. Electronically Signed   By: Franchot Gallo M.D.   On: 10/20/2020 14:57  Labs: BMET Recent Labs  Lab 10/13/20 0519 10/14/20 0223 10/15/20 1600 10/15/20 2214 10/16/20 0500 10/16/20 0853 11/03/2020 0426 10/18/20 0606 10/18/20 1601 10/19/20 0522  NA 148*   < > 139 143 141 145 138 140 143 139  K 4.0   < > 5.8* 4.8 4.4 4.3 4.1 3.8 3.4* 3.0*   CL 123*   < > 119* 117* 114*  --  112* 110 111 106  CO2 17*   < > 12* 15* 15*  --  15* 18* 21* 25  GLUCOSE 184*   < > 274* 109* 183*  --  233* 225* 177* 167*  BUN 79*   < > 115* 121* 120*  --  130* 150* 152* 78*  CREATININE 2.22*   < > 3.54* 3.68* 4.04*  --  4.45* 4.96* 4.91* 2.57*  CALCIUM 8.7*   < > 8.1* 8.0* 7.7*  --  7.8* 8.1* 7.9* 7.7*  PHOS 3.8  --  7.5*  --  8.0*  --  7.4* 4.6 4.6 1.8*   < > = values in this interval not displayed.   CBC Recent Labs  Lab 10/13/20 0519 10/14/20 1121 10/16/20 0500 10/16/20 0853 10/14/2020 0426 10/18/20 0606 10/18/20 1407 10/19/20 0522  WBC 11.1*   < > 11.0*  --  11.4* 13.3*  --  10.3  NEUTROABS 8.9*  --   --   --   --   --   --   --   HGB 7.7*   < > 7.3*   < > 7.3* 7.0* 7.7* 8.3*  HCT 25.7*   < > 23.7*   < > 23.2* 22.0* 24.3* 25.6*  MCV 93.1   < > 91.9  --  91.0 87.0  --  86.2  PLT 237   < > 242  --  252 275  --  270   < > = values in this interval not displayed.    Medications:    . chlorhexidine  15 mL Mouth Rinse BID  . Chlorhexidine Gluconate Cloth  6 each Topical Daily  . insulin aspart  0-20 Units Subcutaneous Q4H  . insulin aspart  2 Units Subcutaneous Q4H  . mouth rinse  15 mL Mouth Rinse q12n4p  . pantoprazole (PROTONIX) IV  40 mg Intravenous BID  . sodium chloride flush  10-40 mL Intracatheter Q12H      Madelon Lips, MD 10/19/2020, 11:22 AM

## 2020-10-19 NOTE — Progress Notes (Signed)
Patient placed back on full support after 2 hours on ATC per MD order. Patient tolerated ATC well for the 2 hours.

## 2020-10-19 NOTE — Progress Notes (Signed)
2 Days Post-Op  Subjective: CRRT started yesterday. Patient is tolerating without pressors. BUN 78 this morning. Bili stable at 3.8. Afebrile. Drains continue to function. Patient is alert, pain controlled. On PRVC this morning but has been doing brief trach collar trials.   Objective: Vital signs in last 24 hours: Temp:  [96.62 F (35.9 C)-99.5 F (37.5 C)] 98.06 F (36.7 C) (04/10 0600) Pulse Rate:  [58-91] 68 (04/10 0600) Resp:  [15-28] 24 (04/10 0600) BP: (97-170)/(61-78) 113/72 (04/10 0600) SpO2:  [94 %-100 %] 98 % (04/10 0600) Arterial Line BP: (121-168)/(52-72) 148/67 (04/10 0600) FiO2 (%):  [30 %] 30 % (04/10 0243) Weight:  [103.2 kg] 103.2 kg (04/10 0400) Last BM Date:  (pta)  Intake/Output from previous day: 04/09 0701 - 04/10 0700 In: 3547.3 [I.V.:3003.9; Blood:322; IV Piggyback:221.4] Out: 4081 [KGYJE:5631; Drains:1310] Intake/Output this shift: Total I/O In: 1343.1 [I.V.:1259.7; IV Piggyback:83.5] Out: 2077 [Urine:450; Drains:650; Other:977]  PE: General: no acute distress Neuro: awake, follows commands HEENT: trach in place, site clean and dry. Scleral icterus. Resp: normal work of breathing, on full vent support Vent Mode: PRVC FiO2 (%):  [30 %] 30 % Set Rate:  [24 bmp] 24 bmp Vt Set:  [650 mL] 650 mL PEEP:  [5 cmH20] 5 cmH20 Pressure Support:  [10 cmH20] 10 cmH20 Plateau Pressure:  [24 cmH20] 24 cmH20 CV: RRR Abdomen: soft, nondistended, wound vac in place on midline incision. JPx2 with milky fluid, likely pancreatic. Biliary drain with bile. LUQ pancreatic stent with clear colorless fluid. G tube in place to gravity LUQ.   Lab Results:  Recent Labs    10/18/20 0606 10/18/20 1407 10/19/20 0522  WBC 13.3*  --  10.3  HGB 7.0* 7.7* 8.3*  HCT 22.0* 24.3* 25.6*  PLT 275  --  270   BMET Recent Labs    10/18/20 1601 10/19/20 0522  NA 143 139  K 3.4* 3.0*  CL 111 106  CO2 21* 25  GLUCOSE 177* 167*  BUN 152* 78*  CREATININE 4.91* 2.57*   CALCIUM 7.9* 7.7*   PT/INR No results for input(s): LABPROT, INR in the last 72 hours. CMP     Component Value Date/Time   NA 139 10/19/2020 0522   NA 142 07/21/2020 0921   K 3.0 (L) 10/19/2020 0522   CL 106 10/19/2020 0522   CO2 25 10/19/2020 0522   GLUCOSE 167 (H) 10/19/2020 0522   BUN 78 (H) 10/19/2020 0522   BUN 16 07/21/2020 0921   CREATININE 2.57 (H) 10/19/2020 0522   CREATININE 1.16 03/01/2016 1607   CALCIUM 7.7 (L) 10/19/2020 0522   PROT 7.2 10/19/2020 0522   PROT 6.7 07/21/2020 0921   ALBUMIN 1.3 (L) 10/19/2020 0522   ALBUMIN 4.1 07/21/2020 0921   AST 95 (H) 10/19/2020 0522   ALT 84 (H) 10/19/2020 0522   ALKPHOS 87 10/19/2020 0522   BILITOT 3.8 (H) 10/19/2020 0522   BILITOT 0.8 07/21/2020 0921   GFRNONAA 30 (L) 10/19/2020 0522   GFRNONAA >89 09/22/2015 1008   GFRAA 82 07/21/2020 0921   GFRAA >89 09/22/2015 1008   Lipase     Component Value Date/Time   LIPASE 23 08/22/2020 0318       Studies/Results: DG Chest 1 View  Result Date: 10/18/2020 CLINICAL DATA:  Chronic ventilator dependent respiratory failure. Bedside central venous catheter placement. EXAM: PORTABLE CHEST 1 VIEW COMPARISON:  10/23/2020 and earlier. FINDINGS: The new RIGHT jugular central venous catheter tip projects over the mid SVC. No  evidence of pneumothorax or mediastinal hematoma. RIGHT arm PICC tip projects over the cavoatrial junction, unchanged. Tracheostomy tube tip in satisfactory position above the carina. LEFT subclavian pacing defibrillator unchanged and appears intact. Cardiac silhouette upper normal in size to slightly enlarged for AP portable technique. Improved aeration in the lung bases since yesterday, with mild atelectasis persisting. No new pulmonary parenchymal abnormalities. IMPRESSION: 1. New RIGHT jugular central venous catheter tip projects over the mid SVC. No acute complicating features. 2. Remaining support apparatus satisfactory. 3. Improved aeration in the lung bases  with mild atelectasis persisting. No new abnormalities. Electronically Signed   By: Evangeline Dakin M.D.   On: 10/18/2020 11:41   DG CHEST PORT 1 VIEW  Result Date: 10/30/2020 CLINICAL DATA:  Tracheostomy placement EXAM: PORTABLE CHEST 1 VIEW COMPARISON:  10/14/2020 FINDINGS: Tracheostomy placed in good position.  No pneumothorax Right arm PICC tip in the SVC. Progression of bibasilar airspace disease right greater than left. AICD unchanged in position. IMPRESSION: Satisfactory tracheostomy placement Progression of bibasilar airspace disease right greater than left. Possible atelectasis versus pneumonia. Electronically Signed   By: Franchot Gallo M.D.   On: 10/22/2020 14:57    Assessment/Plan 50 yo male 1 month s/p Whipple, right colectomy and SMV repair for T3N0 neuroendocrine tumor. C/b postop hemorrhagic shock with DIC and subsequent diffuse small bowel necrosis, now s/p small bowel resection with external drainage of bile duct, pancreatic duct and stomach. - Continue abx for Klebsiella pneumonia, complete a 7-day course - Acute renal failure: on CRRT, nephrology following. Continues to make urine. - Continue TPN - G tube to gravity - Vent wean as tolerated - VTE: SQH - Dispo: ICU    LOS: 27 days    Michaelle Birks, MD [facility] Surgery General, Hepatobiliary and Pancreatic Surgery 10/19/20 6:37 AM

## 2020-10-19 NOTE — Progress Notes (Signed)
Pt has been on trach collar for 3 hours, per Dr. Michelle Piper request and is tolerating well. Notified RT who will place the pt back on the ventilator following shift change as pt is still tolerating for now.  Dewaine Oats

## 2020-10-19 NOTE — Progress Notes (Signed)
[name], MRN:  [id number], DOB:  07-05-1971, LOS: 76 ADMISSION DATE:  10/01/2020, CONSULTATION DATE:  10/19/20 REFERRING MD:  Anesthesia, CHIEF COMPLAINT:  Whipple, post-op shock   Brief History:  50 y.o. M with PMH of neuroendocrine tumor and underwent Whipple procedure 3/14 with intra-operative SMV laceration and repair by vascular with bovine pericardial patch.  Pt had worsening shock overnight and required three pressors despite massive transfusion protocol (received 150 blood products).  He was taken back to the OR for washout on 3/15 and returned to the ICU intubated.  Taken back 3/17 again found to have small bowel necrosis s/p resection.  Plans again to return 3/19 to OR.   Totality of small bowel has been resected. The case was discussed with [facility] who may consider eval for sm intestinal transplant pending clinical course.  Continuing aggressive supportive care.   Past Medical History:   has a past medical history of AICD (automatic cardioverter/defibrillator) present, Dyslipidemia, Heart failure with mildly reduced EF, History of kidney stones, HTN (hypertension), ventricular fibrillation, and Nonischemic cardiomyopathy.  Significant Hospital Events:  3/14: Admit to Surgery, to OR for whipple, SMV injury and repair, massive transfusion protocol overnight  3/15: Back to OR for washout, x2. IR for arteriogram. No major bleed source identified in OR, or arterial bleed w IR. Robust product resuscitation >75 products (+ TXA + novoseven) 3/16: off pressors, add'l 39 products overnight. Slowing resuscitation this morning with hemodynamic improvements and slowing drain output; OR x 2 - washout, biliary t tube, wound vac -- washout, wound vac, IR for arteriogram -- no arterial bleed for embolization 3/17:  Aggressive balanced transfusion continue overnight with marked hemodynamic improvement since yesterday evening. Off pressors x several hours, Wound vac output significantly slowing  (from 553ml q15-10min to 513ml q1hr+) and output is much thinner, Thin bloody secretions from mouth approx 2100ml, Decreased RR from 22 to 18, Unable to tolerate NE- severe bradycardia 3/19 taken back to OR. Small bowel noted to be almost completely necrosed. Patient was closed with plans for goals of car discussion.  3/20 GOC family endorses full scope of treatment. Primary team discussed with [facility] the possibility of transfer for small bowel transplant. [facility] felt as though transfer would not change outcome. 3/21 Ongoing discussion with family and [facility]. Patient may be a candidate for transplant if he can stabilize post a small bowel resection. He was taken back to the OR and small bowel was resected.  3/23 weaning pressors. Versed off. Remains encephalopathic 3/24 off pressors. Low dose dilaudid gtt -- only weakly grimaces to pain. Changing sedation to precedex + PRN fentanyl. Long discussion with 2 brothers regarding clinical case -- tried to clarify that while the term "stable" has been used, in this instance is meaning that he has not declined from previous shift but is in fact still critically ill with multisystem organ dysfunction.  3/25 Cr and BUN increased. Off sedation  3/26 Pressors off, CT head benign, BUN/Cr continue to creep up 3/27 Tachycardia/HTN overnight improved with fentanyl gtt 2/28 PCCM sign off. Trauma to take over vent/CCM needs.  3/30 extubated 4/2 desaturated overnight to the 30s improved with BVM and NTS.  4/5 poor airway protection. PCCM consulted for re-intubation.  4/6 CT a/p Large RLL opacity, smaller LLL opacity. Post op changes. 5.4cm fluid collection in posterior pelvis, concerning for abscess. Started on Zosyn  4/7 got transfused overnight and then diuresed. Worse renal function 4/9 transfused again. HD line inserted and started on  CRRT 4/10 started on trach collar trials.  Consults:  PCCM VVS  IR  Palliative  Procedures:  PICC 3/25 > JP x 2  Biliary  drain Pancreatic stent drain ETT 2/24>>3/13; 3/15 >> 3/29;  4/5 >   Significant Diagnostic Tests:  See Radiology tab   Most recent significant exam was CT a/p 4/6: which revealed R>L pulmonary opacities, and pelvic fluid collection concerning for abscess as well as detailing the numerous post-op changes.   Micro Data:  See micro tab 4/5 trach asp >> Gram neg coccobacilli   Antimicrobials:  Zosyn 3/21 > 3/29; 4/5 >> Eraxis 3/23> 3/29  Interim History / Subjective:   Started on CRRT yesterday.  Still running even.  We will try pulling fluid today.  On trach collar.  No voiced complaints.  Objective   Blood pressure 112/71, pulse 70, temperature 98.42 F (36.9 C), resp. rate (!) 22, height 6\' 2"  (1.88 m), weight 103.2 kg, SpO2 100 %.    Vent Mode: PRVC FiO2 (%):  [30 %-40 %] 40 % Set Rate:  [24 bmp] 24 bmp Vt Set:  [650 mL] 650 mL PEEP:  [5 cmH20] 5 cmH20 Pressure Support:  [10 cmH20] 10 cmH20 Plateau Pressure:  [24 cmH20-25 cmH20] 25 cmH20   Intake/Output Summary (Last 24 hours) at 10/19/2020 1041 Last data filed at 10/19/2020 1000 Gross per 24 hour  Intake 4327.06 ml  Output 5472 ml  Net -1144.94 ml   Filed Weights   10/01/20 1503 10/15/20 1503 10/19/20 0400  Weight: 124.9 kg 94.4 kg 103.2 kg   Examination:  General:  Obese man in no distress.  HEENT: [place]. Tracheostomy in place.  Mild scleral icterus, mucus membranes are moist. Neuro: Awakens to voice, follows commands. Generalized weakness but moves all limbs.  CV: rr with some PVCs. Cap refill < 3 seconds. S1s2, PULM:  Symmetrical chest expansion, chest clear bilaterally.   GI: Round, mild tenderness localized to surgical site. Multiple drains-- JPs with turbid fluid. VAC dressing in place. Extremities:.  No edema, significant muscle wasting. Skin: c/d/w no rash   Resolved Hospital Problem list   Hemorrhagic shock hypophosphatemia  Assessment & Plan:   Critically ill due to acute hypoxemic respiratory  failure requiring mechanical ventilation. due to generalized weakness and possible aspiration pneumonia -Continue daily SBT - trach collar today for 2h intervals bid with rest on full support in between. -Complete 7 days of Zosyn  Pancreatic neuroendocrine tumor s/p whipple 3/21 b/c hemorrhagic shock, c/b small bowel necrosis now s/p total small bowel resection and externa; drainage for bile duct pancreatic duct and stomach.  - Continue TPN - Will need tunneled access for TPN to decrease infection risk.  - Family's continued hope is for transfer to [facility] for small bowel transplant. Suspect pt has quite a ways to go before stable enough to be considered, and am unsure if underlying conditions like NICM would preclude candidacy. Defer these conversations to CCS   Critically ill due to septic shock requiring titration of NE, suspect septic in setting of PNA and possible pelvic abscess.  - Weaned off NE yesterday.  - Discontinue arterial line tomorrow if tolerates fluid removal with CRRT   AKI, worse since reintubation - possible hypotensive hit -We will start removing fluid with CRRT today. - Nausea may be result of uremia.   Transaminitis, mild due to TPN - follow as improving.   Hyperglycemia due to stress hyperglycemia and TPN - rSSI   Anemia of critical illness  - follow  CBC - Transfuse for hgb < 7.   NICM s/p ICD -Well compensated at this time.   [place] Suspect poor prognosis.  The family hopes for a small intestine transplant -- I think there are currently several acute barriers before the pt could even possibly be considered for potential candidacy   Daily Goals Checklist  Pain/Anxiety/Delirium protocol (if indicated): PRN only for RASS goal 0 to -1 VAP protocol (if indicated): bundle in place.  DVT prophylaxis: SCD Nutrition Status: TPN - needs tunneled line.  GI prophylaxis: Pantoprazole Urinary catheter: external Central lines: PICC 3/25 Glucose control: -SSI +  TPN Mobility/therapy needs: Mobilize to chair if possible Restraints: Restraint type:NA Reason for restraints:NA Daily labs: CBC, CMP daily Code Status: Partial - -no CV/DF (has an ICD however) Family Communication: per primary Disposition: ICU.  Goals of Care:  Last date of multidisciplinary goals of care discussion: 72/5 Family and staff present: Dr. Zenia Resides Summary of discussion: Family wishes for aggressive care.   Code Status:  Partial - no CV/DF by [place] (has an ICD which is not to be turned off)  CRITICAL CARE Performed by: Kipp Brood   Total critical care time: 40 minutes  Critical care time was exclusive of separately billable procedures and treating other patients. Critical care was necessary to treat or prevent imminent or life-threatening deterioration.  Critical care was time spent personally by me on the following activities: development of treatment plan with patient and/or surrogate as well as nursing, discussions with consultants, evaluation of patient's response to treatment, examination of patient, obtaining history from patient or surrogate, ordering and performing treatments and interventions, ordering and review of laboratory studies, ordering and review of radiographic studies, pulse oximetry and re-evaluation of patient's condition.  Kipp Brood, MD [facility] [place] ICU Physician [place]  Pager: [phone] Or Epic Secure Chat After hours: [phone].  10/19/2020, 10:41 AM

## 2020-10-20 ENCOUNTER — Encounter (HOSPITAL_COMMUNITY): Payer: Self-pay | Admitting: Surgery

## 2020-10-20 ENCOUNTER — Inpatient Hospital Stay (HOSPITAL_COMMUNITY): Payer: Managed Care, Other (non HMO)

## 2020-10-20 DIAGNOSIS — J9601 Acute respiratory failure with hypoxia: Secondary | ICD-10-CM | POA: Diagnosis not present

## 2020-10-20 LAB — DIFFERENTIAL
Abs Immature Granulocytes: 1.14 10*3/uL — ABNORMAL HIGH (ref 0.00–0.07)
Basophils Absolute: 0.1 10*3/uL (ref 0.0–0.1)
Basophils Relative: 0 %
Eosinophils Absolute: 0 10*3/uL (ref 0.0–0.5)
Eosinophils Relative: 0 %
Immature Granulocytes: 9 %
Lymphocytes Relative: 11 %
Lymphs Abs: 1.4 10*3/uL (ref 0.7–4.0)
Monocytes Absolute: 1.2 10*3/uL — ABNORMAL HIGH (ref 0.1–1.0)
Monocytes Relative: 9 %
Neutro Abs: 9.3 10*3/uL — ABNORMAL HIGH (ref 1.7–7.7)
Neutrophils Relative %: 71 %

## 2020-10-20 LAB — COMPREHENSIVE METABOLIC PANEL
ALT: 94 U/L — ABNORMAL HIGH (ref 0–44)
AST: 98 U/L — ABNORMAL HIGH (ref 15–41)
Albumin: 1.5 g/dL — ABNORMAL LOW (ref 3.5–5.0)
Alkaline Phosphatase: 115 U/L (ref 38–126)
Anion gap: 13 (ref 5–15)
BUN: 43 mg/dL — ABNORMAL HIGH (ref 6–20)
CO2: 22 mmol/L (ref 22–32)
Calcium: 8.1 mg/dL — ABNORMAL LOW (ref 8.9–10.3)
Chloride: 99 mmol/L (ref 98–111)
Creatinine, Ser: 1.61 mg/dL — ABNORMAL HIGH (ref 0.61–1.24)
GFR, Estimated: 52 mL/min — ABNORMAL LOW (ref >60)
Glucose, Bld: 177 mg/dL — ABNORMAL HIGH (ref 70–99)
Potassium: 3.4 mmol/L — ABNORMAL LOW (ref 3.5–5.1)
Sodium: 134 mmol/L — ABNORMAL LOW (ref 135–145)
Total Bilirubin: 3.9 mg/dL — ABNORMAL HIGH (ref 0.3–1.2)
Total Protein: 7.8 g/dL (ref 6.5–8.1)

## 2020-10-20 LAB — GLUCOSE, CAPILLARY
Glucose-Capillary: 150 mg/dL — ABNORMAL HIGH (ref 70–99)
Glucose-Capillary: 180 mg/dL — ABNORMAL HIGH (ref 70–99)
Glucose-Capillary: 160 mg/dL — ABNORMAL HIGH (ref 70–99)
Glucose-Capillary: 195 mg/dL — ABNORMAL HIGH (ref 70–99)
Glucose-Capillary: 182 mg/dL — ABNORMAL HIGH (ref 70–99)

## 2020-10-20 LAB — CBC
HCT: 30 % — ABNORMAL LOW (ref 39.0–52.0)
Hemoglobin: 9.7 g/dL — ABNORMAL LOW (ref 13.0–17.0)
MCH: 27.7 pg (ref 26.0–34.0)
MCHC: 32.3 g/dL (ref 30.0–36.0)
MCV: 85.7 fL (ref 80.0–100.0)
Platelets: 331 10*3/uL (ref 150–400)
RBC: 3.5 MIL/uL — ABNORMAL LOW (ref 4.22–5.81)
RDW: 18 % — ABNORMAL HIGH (ref 11.5–15.5)
WBC: 13.1 10*3/uL — ABNORMAL HIGH (ref 4.0–10.5)
nRBC: 0.7 % — ABNORMAL HIGH (ref 0.0–0.2)

## 2020-10-20 LAB — RENAL FUNCTION PANEL
Albumin: 1.6 g/dL — ABNORMAL LOW (ref 3.5–5.0)
Anion gap: 10 (ref 5–15)
BUN: 40 mg/dL — ABNORMAL HIGH (ref 6–20)
CO2: 23 mmol/L (ref 22–32)
Calcium: 8.4 mg/dL — ABNORMAL LOW (ref 8.9–10.3)
Chloride: 100 mmol/L (ref 98–111)
Creatinine, Ser: 1.58 mg/dL — ABNORMAL HIGH (ref 0.61–1.24)
GFR, Estimated: 53 mL/min — ABNORMAL LOW (ref >60)
Glucose, Bld: 195 mg/dL — ABNORMAL HIGH (ref 70–99)
Phosphorus: 2.2 mg/dL — ABNORMAL LOW (ref 2.5–4.6)
Potassium: 3.4 mmol/L — ABNORMAL LOW (ref 3.5–5.1)
Sodium: 133 mmol/L — ABNORMAL LOW (ref 135–145)

## 2020-10-20 LAB — MAGNESIUM: Magnesium: 2.5 mg/dL — ABNORMAL HIGH (ref 1.7–2.4)

## 2020-10-20 LAB — TRIGLYCERIDES: Triglycerides: 299 mg/dL — ABNORMAL HIGH (ref <150)

## 2020-10-20 LAB — PHOSPHORUS: Phosphorus: 2 mg/dL — ABNORMAL LOW (ref 2.5–4.6)

## 2020-10-20 LAB — PREALBUMIN: Prealbumin: 22.8 mg/dL (ref 18–38)

## 2020-10-20 MED ORDER — FAT EMULSION PLANT BASED 20% (INTRALIPID)IV EMUL
250.0000 mL | INTRAVENOUS | Status: AC
  Administered 2020-10-20: 250 mL via INTRAVENOUS
  Filled 2020-10-20: qty 250

## 2020-10-20 MED ORDER — FENTANYL CITRATE (PF) 100 MCG/2ML IJ SOLN
50.0000 ug | INTRAMUSCULAR | Status: DC | PRN
  Administered 2020-10-20 – 2020-10-24 (×4): 100 ug via INTRAVENOUS
  Administered 2020-10-26: 25 ug via INTRAVENOUS
  Administered 2020-10-26: 50 ug via INTRAVENOUS
  Administered 2020-10-26 – 2020-10-27 (×5): 100 ug via INTRAVENOUS
  Administered 2020-10-27: 50 ug via INTRAVENOUS
  Administered 2020-10-28 (×3): 100 ug via INTRAVENOUS
  Administered 2020-10-28: 50 ug via INTRAVENOUS
  Administered 2020-10-28 – 2020-10-31 (×11): 100 ug via INTRAVENOUS
  Administered 2020-10-31 (×4): 50 ug via INTRAVENOUS
  Administered 2020-10-31 – 2020-11-01 (×3): 100 ug via INTRAVENOUS
  Administered 2020-11-01 (×2): 50 ug via INTRAVENOUS
  Administered 2020-11-01 – 2020-11-04 (×20): 100 ug via INTRAVENOUS
  Filled 2020-10-20 (×53): qty 2

## 2020-10-20 MED ORDER — ZINC CHLORIDE 1 MG/ML IV SOLN
INTRAVENOUS | Status: AC
  Filled 2020-10-20: qty 1246.4

## 2020-10-20 NOTE — Progress Notes (Addendum)
[facility] Progress Note     Assessment/ Plan:   1.  Acute renal failure - he has reasons to be in ATN With hypotensive episodes requiring Levophed in the setting of a very complicated surgical history with multiple visits to the OR + DIC + diffuse small bowel necrosis. Creatinine fluctuating represents compromised renal function with unfortunately repeated insults. No evidence of obstruction.  continued purulent drainage from JP drain in abdomen.  - remains non-oliguric but BUN/Cr rising.   - Pt and family desire all aggressive interventions, confirmed 10/16/20 - started CRRT 10/18/20- d/w PCCM/ CCS, all 4K bath, no heparin - d/w pharmacy K of 3.0--> giving Kphos as supplement today and reformulating TPN to include K and Phos now that on CRRT - Avoid nephrotoxins and dose medications for a GFR < 15 ml/min.  Seen on CRRT RIJ temp Pre/post/qd 400/200/2000 All 4k baths Net uf 66ml/hr   - plan on stopping CRRT at 1800 today (he will have received 48hrs of CRRT); labs tomorrow + monitor UOP and determine if there are any signs of recovery. If not then will challenge with iHD on Wed.  2. Respiratory failure reintubated now on the ventilator--> s/p trach in OR 11/06/2020  3. Neuroendocrine tumor s/p Whipple with unfortunate extensive small bowel necrosis s/p resection.  Family is hopeful for a small bowel transplant, however this is quite a long ways off d/t multiple issues.  CT 4/6 showing possible pelvic abscess, has been started on Zosyn  4. Severe malnutrition on TPN.  5. Anemia- Hgb 8.3   Subjective:   CRRT started 4/9 1600,  K 3's  this AM on 4K bath.  On TC this AM.    Objective:   BP 119/87   Pulse 85   Temp 98.6 F (37 C)   Resp (!) 21   Ht 6\' 2"  (1.88 m)   Wt 103.2 kg   SpO2 97%   BMI 29.21 kg/m   Intake/Output Summary (Last 24 hours) at 10/20/2020 1245 Last data filed at 10/20/2020 1200 Gross per 24 hour  Intake 3084.74 ml  Output 5967 ml  Net  -2882.26 ml   Weight change:   Physical Exam: Gen: ill-appearing on the vent, awake and alert CVS: tachycardic Resp: coarse mechanical Abd: distended, pancreatic drain in place, biliary drain, 2 JPs and a drain from the colostomy stoma Ext: 1+ anasarca  Imaging: DG CHEST PORT 1 VIEW  Result Date: 10/20/2020 CLINICAL DATA:  Respiratory failure. EXAM: PORTABLE CHEST 1 VIEW COMPARISON:  October 18, 2020. FINDINGS: The heart size and mediastinal contours are within normal limits. No pneumothorax or pleural effusion is noted. Tracheostomy tube is in good position. Left-sided pacemaker is unchanged. Right internal jugular catheter is unchanged. Mild right basilar subsegmental atelectasis or infiltrate is noted. The visualized skeletal structures are unremarkable. IMPRESSION: Mild right basilar subsegmental atelectasis or infiltrate. Stable support apparatus. Electronically Signed   By: Marijo Conception M.D.   On: 10/20/2020 08:16    Labs: BMET Recent Labs  Lab 10/16/20 0500 10/16/20 0853 10/24/2020 0426 10/18/20 0606 10/18/20 1601 10/19/20 0522 10/19/20 1545 10/20/20 0515  NA 141 145 138 140 143 139 134* 134*  K 4.4 4.3 4.1 3.8 3.4* 3.0* 3.4* 3.4*  CL 114*  --  112* 110 111 106 100 99  CO2 15*  --  15* 18* 21* 25 27 22   GLUCOSE 183*  --  233* 225* 177* 167* 206* 177*  BUN 120*  --  130* 150* 152*  78* 54* 43*  CREATININE 4.04*  --  4.45* 4.96* 4.91* 2.57* 1.87* 1.61*  CALCIUM 7.7*  --  7.8* 8.1* 7.9* 7.7* 7.8* 8.1*  PHOS 8.0*  --  7.4* 4.6 4.6 1.8* 2.8 2.0*   CBC Recent Labs  Lab 10/10/2020 0426 10/18/20 0606 10/18/20 1407 10/19/20 0522 10/20/20 0515  WBC 11.4* 13.3*  --  10.3 13.1*  NEUTROABS  --   --   --   --  9.3*  HGB 7.3* 7.0* 7.7* 8.3* 9.7*  HCT 23.2* 22.0* 24.3* 25.6* 30.0*  MCV 91.0 87.0  --  86.2 85.7  PLT 252 275  --  270 331    Medications:    . chlorhexidine  15 mL Mouth Rinse BID  . Chlorhexidine Gluconate Cloth  6 each Topical Daily  . heparin injection  (subcutaneous)  5,000 Units Subcutaneous Q8H  . insulin aspart  0-20 Units Subcutaneous Q4H  . insulin aspart  2 Units Subcutaneous Q4H  . mouth rinse  15 mL Mouth Rinse q12n4p  . pantoprazole (PROTONIX) IV  40 mg Intravenous BID  . sodium chloride flush  10-40 mL Intracatheter Q12H      Otelia Santee, MD 10/20/2020, 12:45 PM

## 2020-10-20 NOTE — Progress Notes (Signed)
Pt placed on TC at this time for evening wean.

## 2020-10-20 NOTE — Progress Notes (Signed)
PHARMACY - TOTAL PARENTERAL NUTRITION CONSULT NOTE  Indication:  Short bowel syndrome  Patient Measurements: Height: 6\' 2"  (188 cm) Weight: 124.9 kg (275 lb 5.7 oz) IBW/kg (Calculated) : 82.2 TPN AdjBW (KG): 95.3 Body mass index is 35.35 kg/m.  Assessment:  32 YOM with history of mass adjacent to head of the pancreas in 2021. 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  Developed cholecystitis in Feb 2022 and had percutaneous cholecystectomy tube placement.  Presented this admission for neuroendocrine tumor resection with Whipple procedure, also had right total colectomy, colostomy and cholecystectomy on 09/24/2020. SMV was lacerated during procedure and repaired by bovine pericardial patch. Developed hemorrhagic shock with bile leak post-op and underwent placement of biliary T tube with abdominal washout on 3/15 PM. Abdomen remains open. Pharmacy consulted to manage TPN.  Glucose / Insulin: no hx DM, A1c 5.2%. CBGs controlled.  Utilized 34 units SSI in the past 24h hours, 50 units in TPN, Novolog 2 units Q4H (hold for CBG < 150).   Electrolytes: K stable at 3.4, Phos 1.8 > 2, CO2 normalized (max acetate in TPN), CoCa down to 9.7 (iCa 1.16 low normal on 4/7), others WNL Renal: CRRT started 4/9, 4K bath (BL 1-1.2), BUN down to 43 Hepatic: LFTs mildly elevated, tbili 3.9 (scleral yellowing still noted per RN 4/3) Albumin 1.5, prealbumin 22.8 (4/11) TG peaked at 740, now at 299 on 4/11 - Intralipids 100g/wk and monitor TG trends Intake / Output; MIVF: UOP 0.9 ml/kg/hr, drain (4 total): 1279mL, 50 cc from gastrostomy, net -8L GI Imaging:  4/6 CT - pelvic fluid collection concerning for abscess GI Surgeries / Procedures:  3/14 Whipple procedure with R hemicolectomy and end ileostomy, cholecystectomy 3/15 washout, VAC placement 3/17 washout, VAC replacement.  Necrosis of entire small bowel except for the PB limb and proximal 40-50cm of jejunum 3/19 re-opening  laparotomy, abd closure.  Necrotic SB. 3/21 SB resection, take down of choledochojejunal anastomosis and pancreaticojejunal anastomosis, take down of duodenojejunal anastomosis, placement of externalized biliary drain / Stamm gastrostomy tube, IA washout and placement of intraperitoneal drains 3/30 extubation 4/8 trach  Central access: R IJ CVC placed 3/15; changed to PICC placed 10/03/20 TPN start date: 09/17/2020  Nutritional Goals (RD rec on 3/30): 2800-3000 kCal, 185-210g AA, fluid >2L/day  Current Nutrition:  TPN  Plan:  - Adjust TPN to provide volume for electrolytes adjustments - Concentrated TPN at 95 ml/hr - Intralipid 20% 294mL Mon/Thurs for now to prevent EFAD - TPN with Intralipid 2x/wk provides a daily average of 187g AA and 2869 kCal, meeting 100% of patient needs.  - Electrolytes in TPN: Na 42mEq/L, continue K 63mEq/L, Ca 30mEq/L, Continue Mag 45mEq/L, Continue Phos 69mmol/L, max acetate - Daily multivitamin in TPN.  Remove standard trace elements and add back zinc 5mg , and selenium 59mcg.  Remove chromium while on RRT. - Continue resistant SSI Q4H, 50 units regular insulin in TPN, Novolog 2 units SQ Q6H. - Standard TPN labs and TG on Mon/Thurs  Albertina Parr, PharmD., BCPS, BCCCP Clinical Pharmacist Please refer to [facility] for unit-specific pharmacist

## 2020-10-20 NOTE — Progress Notes (Addendum)
[name], MRN:  [id number], DOB:  12-May-1971, LOS: 57 ADMISSION DATE:  09/13/2020, CONSULTATION DATE:  10/20/20 REFERRING MD:  Anesthesia, CHIEF COMPLAINT:  Whipple, post-op shock   Brief History:  50 y.o. M with PMH of neuroendocrine tumor and underwent Whipple procedure 3/14 with intra-operative SMV laceration and repair by vascular with bovine pericardial patch.  Pt had worsening shock overnight and required three pressors despite massive transfusion protocol (received 150 blood products).  He was taken back to the OR for washout on 3/15 and returned to the ICU intubated.  Taken back 3/17 again found to have small bowel necrosis s/p resection.  Plans again to return 3/19 to OR.   Totality of small bowel has been resected. The case was discussed with [name] who may consider eval for sm intestinal transplant pending clinical course.  Continuing aggressive supportive care.   Past Medical History:   has a past medical history of AICD (automatic cardioverter/defibrillator) present, Dyslipidemia, Heart failure with mildly reduced EF, History of kidney stones, HTN (hypertension), ventricular fibrillation, and Nonischemic cardiomyopathy.  Significant Solomon Events:  3/14: Admit to Surgery, to OR for whipple, SMV injury and repair, massive transfusion protocol overnight  3/15: Back to OR for washout, x2. IR for arteriogram. No major bleed source identified in OR, or arterial bleed w IR. Robust product resuscitation >75 products (+ TXA + novoseven) 3/16: off pressors, add'l 39 products overnight. Slowing resuscitation this morning with hemodynamic improvements and slowing drain output; OR x 2 - washout, biliary t tube, wound vac -- washout, wound vac, IR for arteriogram -- no arterial bleed for embolization 3/17:  Aggressive balanced transfusion continue overnight with marked hemodynamic improvement since yesterday evening. Off pressors x several hours, Wound vac output significantly slowing  (from 585ml q15-28min to 520ml q1hr+) and output is much thinner, Thin bloody secretions from mouth approx 247ml, Decreased RR from 22 to 18, Unable to tolerate NE- severe bradycardia 3/19 taken back to OR. Small bowel noted to be almost completely necrosed. Patient was closed with plans for goals of car discussion.  3/20 GOC family endorses full scope of treatment. Primary team discussed with [facility] the possibility of transfer for small bowel transplant. [facility] felt as though transfer would not change outcome. 3/21 Ongoing discussion with family and [facility]. Patient may be a candidate for transplant if he can stabilize post a small bowel resection. He was taken back to the OR and small bowel was resected.  3/23 weaning pressors. Versed off. Remains encephalopathic 3/24 off pressors. Low dose dilaudid gtt -- only weakly grimaces to pain. Changing sedation to precedex + PRN fentanyl. Long discussion with 2 brothers regarding clinical case -- tried to clarify that while the term "stable" has been used, in this instance is meaning that he has not declined from previous shift but is in fact still critically ill with multisystem organ dysfunction.  3/25 Cr and BUN increased. Off sedation  3/26 Pressors off, CT head benign, BUN/Cr continue to creep up 3/27 Tachycardia/HTN overnight improved with fentanyl gtt 2/28 PCCM sign off. Trauma to take over vent/CCM needs.  3/30 extubated 4/2 desaturated overnight to the 30s improved with BVM and NTS.  4/5 poor airway protection. PCCM consulted for re-intubation.  4/6 CT a/p Large RLL opacity, smaller LLL opacity. Post op changes. 5.4cm fluid collection in posterior pelvis, concerning for abscess. Started on Zosyn  4/7 got transfused overnight and then diuresed. Worse renal function 4/9 transfused again. HD line inserted and started on  CRRT 4/10 started on trach collar trials.  Consults:  PCCM VVS  IR  Palliative  Procedures:  PICC 3/25 > JP x 2  Biliary  drain Pancreatic stent drain ETT 2/24>>3/13; 3/15 >> 3/29;  4/5 >  Significant Diagnostic Tests:  See Radiology tab   Most recent significant exam was CT a/p 4/6: which revealed R>L pulmonary opacities, and pelvic fluid collection concerning for abscess as well as detailing the numerous post-op changes.   Micro Data:  See micro tab 4/5 trach asp >> Gram neg coccobacilli   Antimicrobials:  Zosyn 3/21 > 3/29; 4/5 >> Eraxis 3/23> 3/29  Interim History / Subjective:   Tolerating CRRT without vasopressors. Plan to transition to IHD. Continues to have nausea.   Objective   Blood pressure 111/80, pulse 81, temperature 98.78 F (37.1 C), resp. rate (!) 24, height 6\' 2"  (1.88 m), weight 103.2 kg, SpO2 96 %.    Vent Mode: PRVC FiO2 (%):  [30 %-40 %] 30 % Set Rate:  [24 bmp] 24 bmp Vt Set:  [650 mL] 650 mL PEEP:  [5 cmH20] 5 cmH20 Plateau Pressure:  [21 cmH20-30 cmH20] 30 cmH20   Intake/Output Summary (Last 24 hours) at 10/20/2020 0751 Last data filed at 10/20/2020 0700 Gross per 24 hour  Intake 4086.24 ml  Output 5965 ml  Net -1878.76 ml   Filed Weights   10/01/20 1503 10/15/20 1503 10/19/20 0400  Weight: 124.9 kg 94.4 kg 103.2 kg   Examination:  General:  Obese man in no distress.  HEENT: [place]. Tracheostomy in place.  Mild scleral icterus, mucus membranes are moist. Neuro: Awakens to voice, follows commands. Generalized weakness but moves all limbs.  CV: rr with some PVCs. Cap refill < 3 seconds. S1s2, PULM:  Symmetrical chest expansion, chest clear bilaterally.   GI: Round, mild tenderness localized to surgical site. Multiple drains-- JPs with turbid fluid. VAC dressing in place. Extremities:.  No edema, significant muscle wasting. Skin: c/d/w no rash   Resolved Solomon Problem list   Hemorrhagic shock hypophosphatemia  Assessment & Plan:   Critically ill due to acute hypoxemic respiratory failure requiring mechanical ventilation. due to generalized weakness and  possible aspiration pneumonia -Continue daily SBT - trach collar today for 3-4 intervals bid with rest on full support in between. - Start working with Passy-Muir valve.  -Complete 7 days of Zosyn  Pancreatic neuroendocrine tumor s/p whipple 3/21 b/c hemorrhagic shock, c/b small bowel necrosis now s/p total small bowel resection and externa; drainage for bile duct pancreatic duct and stomach.  - Continue TPN - Will need tunneled access for TPN to decrease infection risk.  - Family's continued hope is for transfer to [facility] for small bowel transplant. Suspect pt has quite a ways to go before stable enough to be considered, and am unsure if underlying conditions like NICM would preclude candidacy.   Critically ill due to septic shock requiring titration of NE, suspect septic in setting of PNA and possible pelvic abscess.  - Weaned off NE yesterday.  - Discontinue arterial line tomorrow if tolerates fluid removal with CRRT   AKI, worse since reintubation - possible hypotensive hit -We will start removing fluid with CRRT today. - Nausea may be result of uremia. - Will obtain CXR to evaluate for gastric distention.    Transaminitis, mild due to TPN - follow as improving.   Hyperglycemia due to stress hyperglycemia and TPN - rSSI   Anemia of critical illness  - follow CBC - Transfuse for hgb <  7.   NICM s/p ICD -Well compensated at this time.   [place] Suspect poor prognosis.  The family hopes for a small intestine transplant -- I think there are currently several acute barriers before the pt could even possibly be considered for potential candidacy   Daily Goals Checklist  Pain/Anxiety/Delirium protocol (if indicated): PRN only for RASS goal 0 to -1 VAP protocol (if indicated): bundle in place.  DVT prophylaxis: SCD Nutrition Status: TPN - needs tunneled line.  GI prophylaxis: Pantoprazole Urinary catheter: external Central lines: PICC 3/25 Glucose control: -SSI +  TPN Mobility/therapy needs: Mobilize to chair if possible Restraints: Restraint type:NA Reason for restraints:NA Daily labs: CBC, CMP daily Code Status: Partial - -no CV/DF (has an ICD however) Family Communication: per primary Disposition: ICU.  Goals of Care:  Last date of multidisciplinary goals of care discussion: 76/5 Family and staff present: Dr. Zenia Resides Summary of discussion: Family wishes for aggressive care.   Code Status:  Partial - no CV/DF by [place] (has an ICD which is not to be turned off)  CRITICAL CARE Performed by: Kipp Brood   Total critical care time: 40 minutes  Critical care time was exclusive of separately billable procedures and treating other patients. Critical care was necessary to treat or prevent imminent or life-threatening deterioration.  Critical care was time spent personally by me on the following activities: development of treatment plan with patient and/or surrogate as well as nursing, discussions with consultants, evaluation of patient's response to treatment, examination of patient, obtaining history from patient or surrogate, ordering and performing treatments and interventions, ordering and review of laboratory studies, ordering and review of radiographic studies, pulse oximetry and re-evaluation of patient's condition.  Kipp Brood, MD [facility] ICU Physician [place]  Pager: [phone] Or Epic Secure Chat After hours: [phone].  10/20/2020, 7:51 AM

## 2020-10-20 NOTE — Progress Notes (Signed)
RT placed patient on ventilator at this time to complete evening trach collar wean. Patient tolerating vent well at this time. RT will monitor as needed.

## 2020-10-20 NOTE — Progress Notes (Signed)
Pt placed on 35% ATC per MD order. Pt is tolerating well at this time. RN aware. RT to continue to monitor.

## 2020-10-20 NOTE — Evaluation (Signed)
[facility] Speaking Valve - Evaluation Patient Details  Name: [name] MRN: [id number] Date of Birth: [birth year]  Today's Date: 10/20/2020 Time: 6962-9528 SLP Time Calculation (min) (ACUTE ONLY): 22 min  Past Medical History:  Past Medical History:  Diagnosis Date  . AICD (automatic cardioverter/defibrillator) present   . Dyslipidemia   . Heart failure with mildly reduced EF    EF 35-40 in 2011 // Echo 06/2019: EF 45-50, Gr 2 DD, RAE, mild MR, mild TR, mild PI, RVSP 33.6  . History of kidney stones   . HTN (hypertension)   . Hx of ventricular fibrillation    Hx of VFib arrest s/p AICD placement  . Nonischemic cardiomyopathy    Past Surgical History:  Past Surgical History:  Procedure Laterality Date  . APPLICATION OF WOUND VAC N/A 09/28/2020   Procedure: APPLICATION OF WOUND VAC;  Surgeon: [name], MD;  Location: [place];  Service: General;  Laterality: N/A;  . APPLICATION OF WOUND VAC N/A 10/03/2020   Procedure: APPLICATION OF WOUND VAC;  Surgeon: [name], MD;  Location: [place];  Service: General;  Laterality: N/A;  . APPLICATION OF WOUND VAC  10/03/2020   Procedure: APPLICATION OF WOUND VAC; PLACEMENT OF NEGATIVE PRESSURE DRESSING;  Surgeon: [name], MD;  Location: [place];  Service: General;;  . BOWEL RESECTION  09/13/2020   Procedure: SMALL BOWEL RESECTION;  Surgeon: [name], MD;  Location: [place];  Service: General;;  . BOWEL RESECTION N/A 10/01/2020   Procedure: SMALL BOWEL RESECTION;  Surgeon: [name], MD;  Location: [place];  Service: General;  Laterality: N/A;  . CARDIAC DEFIBRILLATOR PLACEMENT    . CHOLECYSTECTOMY N/A 10/07/2020   Procedure: CHOLECYSTECTOMY;  Surgeon: [name], MD;  Location: [place];  Service: General;  Laterality: N/A;  . COLECTOMY N/A 09/24/2020   Procedure: RIGHT TOTAL COLECTOMY;  Surgeon: [name], MD;  Location: [place];  Service: General;  Laterality: N/A;  . COLOSTOMY  10/07/2020   Procedure:  COLOSTOMY;  Surgeon: [name], MD;  Location: [place];  Service: General;;  . ERCP N/A 05/27/2020   Procedure: ENDOSCOPIC RETROGRADE CHOLANGIOPANCREATOGRAPHY (ERCP);  Surgeon: Clarene Essex, MD;  Location: [place];  Service: Endoscopy;  Laterality: N/A;  . ESOPHAGOGASTRODUODENOSCOPY (EGD) WITH PROPOFOL N/A 06/25/2020   Procedure: ESOPHAGOGASTRODUODENOSCOPY (EGD) WITH PROPOFOL;  Surgeon: Arta Silence, MD;  Location: [facility];  Service: Endoscopy;  Laterality: N/A;  . FINE NEEDLE ASPIRATION N/A 06/25/2020   Procedure: FINE NEEDLE ASPIRATION (FNA) LINEAR;  Surgeon: Arta Silence, MD;  Location: [facility];  Service: Endoscopy;  Laterality: N/A;  . GASTROSTOMY N/A 09/21/2020   Procedure: INSERTION OF GASTROSTOMY TUBE;  Surgeon: [name], MD;  Location: [place];  Service: General;  Laterality: N/A;  . IR ANGIOGRAM SELECTIVE EACH ADDITIONAL VESSEL  09/24/2020  . IR ANGIOGRAM SELECTIVE EACH ADDITIONAL VESSEL  09/17/2020  . IR ANGIOGRAM SELECTIVE EACH ADDITIONAL VESSEL  09/16/2020  . IR ANGIOGRAM VISCERAL SELECTIVE  09/19/2020  . IR PERC CHOLECYSTOSTOMY  08/27/2020  . LAPAROTOMY N/A 09/18/2020   Procedure: REOPENING OF LAPAROTOMY ABDOMINAL WASHOUT;  Surgeon: [name], MD;  Location: [place];  Service: General;  Laterality: N/A;  . LAPAROTOMY N/A 09/20/2020   Procedure: RE-EXPLORATORY LAPAROTOMY;  Surgeon: [name], MD;  Location: [place];  Service: General;  Laterality: N/A;  . LAPAROTOMY N/A 10/07/2020   Procedure: RE-EXPLORATORY LAPAROTOMY; ABDOMINAL WASHOUT;  Surgeon: [name], MD;  Location: [place];  Service: General;  Laterality: N/A;  . LAPAROTOMY N/A 10/01/2020   Procedure: RE -EXPLORATORY LAPAROTOMY AND [place];  Surgeon: [name], MD;  Location: [place];  Service: General;  Laterality: N/A;  . LAPAROTOMY N/A 10/05/2020   Procedure: RE-EXPLORATORY LAPAROTOMY;  Surgeon: [name], MD;  Location: [place];  Service: General;  Laterality: N/A;  . PACEMAKER  IMPLANT    . PATCH ANGIOPLASTY N/A 09/21/2020   Procedure: PATCH ANGIOPLASTY SUPERIOR MESENTERIC VEIN;  Surgeon: Elam Dutch, MD;  Location: [facility] OR;  Service: Vascular;  Laterality: N/A;  . RADIOLOGY WITH ANESTHESIA N/A 10/04/2020   Procedure: RADIOLOGY WITH ANESTHESIA;  Surgeon: Radiologist, Medication, MD;  Location: [place];  Service: Radiology;  Laterality: N/A;  . REMOVAL OF STONES  05/27/2020   Procedure: REMOVAL OF STONES;  Surgeon: Clarene Essex, MD;  Location: [facility] END[facility];  Service: Endoscopy;;  . repeat echo  2008    demonstrated near normalization  . SPHINCTEROTOMY  05/27/2020   Procedure: SPHINCTEROTOMY;  Surgeon: Clarene Essex, MD;  Location: [facility] ENDOSCOPY;  Service: Endoscopy;;  . TRACHEOSTOMY TUBE PLACEMENT N/A 10/27/2020   Procedure: TRACHEOSTOMY;  Surgeon: [name], MD;  Location: [place];  Service: General;  Laterality: N/A;  . TRANSTHORACIC ECHOCARDIOGRAM  11/09/2005, 01/15/2009, 02/07/2010  . UPPER ESOPHAGEAL ENDOSCOPIC ULTRASOUND (EUS) N/A 06/25/2020   Procedure: UPPER ESOPHAGEAL ENDOSCOPIC ULTRASOUND (EUS);  Surgeon: Arta Silence, MD;  Location: [facility];  Service: Endoscopy;  Laterality: N/A;  . WHIPPLE PROCEDURE N/A 09/14/2020   Procedure: WHIPPLE PROCEDURE;  Surgeon: [name], MD;  Location: [place];  Service: General;  Laterality: N/A;  ROOM 2 STARTING AT 07:30AM FOR 380 MIN   HPI:  37 yr with history of mass adjacent to head of the pancreas in 2021.  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  Developed cholecystitis in Feb 2022 and had percutaneous cholecystectomy tube placement.  Presented this admission for neuroendocrine tumor resection with Whipple procedure, also had right total colectomy, colostomy and cholecystectomy on 09/18/2020. SMV was lacerated during procedure and repaired by bovine pericardial patch. Developed hemorrhagic shock with bile leak post-op and underwent placement of biliary T tube with abdominal washout on  3/15 PM. All of small bowel has been resected, with external drainage of the bile duct, pancreas and stomach. Pt on TPN. intubated 3/14-3/30.   Assessment / Plan / Recommendation Clinical Impression  Pt able to access upper airway with adequate air flow for vibratory movement of vocal cords with PMV. Once cuff was deflated and reflexive coughs cleared secretions via trach, speaking valve donned and stable throughout evaluation. Pt noted to speak during inhalation and therapist demonstrated onset of phonation at expiratory phase of respiratory cycle. He was able to demonstrate this 70% of the time. Pt's brother asking great questions re: reason for trach, how PMV works and educated pt and brother re: use of PMV. Per RN pt on trach collar yesterday for total 5 hours (two 2.5 session) and plan to stay on TC as able. Will limit use with valve this morning as brothers visiting to conserve energy. Recommend pt wear valve intermittently with full supervison from [place] and staff. SLP Visit Diagnosis: Aphonia (R49.1)    SLP Assessment  Patient needs continued Speech Lanaguage Pathology Services    Follow Up Recommendations  24 hour supervision/assistance;Skilled Nursing facility    Frequency and Duration min 2x/week  2 weeks    PMSV Trial PMSV was placed for: 18 min Able  to redirect subglottic air through upper airway: Yes Able to Attain Phonation: Yes Voice Quality: Hoarse;Low vocal intensity Able to Expectorate Secretions: No attempts Breath Support for Phonation: Moderately decreased Respirations During Trial:  (19-21) SpO2 During Trial: 96 % Pulse During Trial:  (89) Behavior: Alert;Controlled;Cooperative;Responsive to questions   Tracheostomy Tube       Vent Dependency  FiO2 (%): 35 %    Cuff Deflation Trial  GO Tolerated Cuff Deflation: Yes Length of Time for Cuff Deflation Trial:  (22 min session and remained deflated) Behavior: Alert;Controlled;Cooperative;Responsive to questions         Houston Siren 10/20/2020, 11:17 AM  Orbie Pyo Colvin Caroli.Ed Risk analyst [phone] Office [phone]

## 2020-10-20 NOTE — Progress Notes (Signed)
3 Days Post-Op  Subjective: Tolerated 3 hours of trach collar yesterday evening. Endorses abdominal pain this morning. Afebrile, remains off pressors and tolerating CRRT.   Objective: Vital signs in last 24 hours: Temp:  [97.7 F (36.5 C)-99.14 F (37.3 C)] 98.96 F (37.2 C) (04/11 0600) Pulse Rate:  [61-84] 80 (04/11 0600) Resp:  [14-25] 22 (04/11 0600) BP: (93-136)/(69-102) 104/74 (04/11 0600) SpO2:  [96 %-100 %] 97 % (04/11 0600) Arterial Line BP: (131-155)/(65-74) 131/67 (04/10 2300) FiO2 (%):  [30 %-40 %] 30 % (04/11 0338) Last BM Date:  (pta)  Intake/Output from previous day: 04/10 0701 - 04/11 0700 In: 3958.4 [I.V.:2922.1; IV Piggyback:946.4] Out: 44 [Urine:960; Drains:1235] Intake/Output this shift: Total I/O In: 1192.4 [I.V.:1045.2; Other:80; IV Piggyback:67.2] Out: 2153 [Urine:310; Drains:466; TIWPY:0998]  PE: General: no acute distress Neuro: awake, follows commands HEENT: trach in place, site clean and dry. Scleral icterus. Resp: normal work of breathing, on vent Vent Mode: PRVC FiO2 (%):  [30 %-40 %] 30 % Set Rate:  [24 bmp] 24 bmp Vt Set:  [650 mL] 650 mL PEEP:  [5 cmH20] 5 cmH20 Plateau Pressure:  [21 cmH20-30 cmH20] 30 cmH20 CV: RRR Abdomen: soft, nondistended, wound vac in place on midline incision. JPx2 with milky brown fluid. Biliary drain with bile. LUQ pancreatic stent with clear colorless fluid. G tube in place to gravity LUQ.   Lab Results:  Recent Labs    10/19/20 0522 10/20/20 0515  WBC 10.3 13.1*  HGB 8.3* 9.7*  HCT 25.6* 30.0*  PLT 270 331   BMET Recent Labs    10/19/20 1545 10/20/20 0515  NA 134* 134*  K 3.4* 3.4*  CL 100 99  CO2 27 22  GLUCOSE 206* 177*  BUN 54* 43*  CREATININE 1.87* 1.61*  CALCIUM 7.8* 8.1*   PT/INR No results for input(s): LABPROT, INR in the last 72 hours. CMP     Component Value Date/Time   NA 134 (L) 10/20/2020 0515   NA 142 07/21/2020 0921   K 3.4 (L) 10/20/2020 0515   CL 99  10/20/2020 0515   CO2 22 10/20/2020 0515   GLUCOSE 177 (H) 10/20/2020 0515   BUN 43 (H) 10/20/2020 0515   BUN 16 07/21/2020 0921   CREATININE 1.61 (H) 10/20/2020 0515   CREATININE 1.16 03/01/2016 1607   CALCIUM 8.1 (L) 10/20/2020 0515   PROT 7.8 10/20/2020 0515   PROT 6.7 07/21/2020 0921   ALBUMIN 1.5 (L) 10/20/2020 0515   ALBUMIN 4.1 07/21/2020 0921   AST 98 (H) 10/20/2020 0515   ALT 94 (H) 10/20/2020 0515   ALKPHOS 115 10/20/2020 0515   BILITOT 3.9 (H) 10/20/2020 0515   BILITOT 0.8 07/21/2020 0921   GFRNONAA 52 (L) 10/20/2020 0515   GFRNONAA >89 09/22/2015 1008   GFRAA 82 07/21/2020 0921   GFRAA >89 09/22/2015 1008   Lipase     Component Value Date/Time   LIPASE 23 08/22/2020 0318       Studies/Results: DG Chest 1 View  Result Date: 10/18/2020 CLINICAL DATA:  Chronic ventilator dependent respiratory failure. Bedside central venous catheter placement. EXAM: PORTABLE CHEST 1 VIEW COMPARISON:  10/31/2020 and earlier. FINDINGS: The new RIGHT jugular central venous catheter tip projects over the mid SVC. No evidence of pneumothorax or mediastinal hematoma. RIGHT arm PICC tip projects over the cavoatrial junction, unchanged. Tracheostomy tube tip in satisfactory position above the carina. LEFT subclavian pacing defibrillator unchanged and appears intact. Cardiac silhouette upper normal in size to slightly enlarged  for AP portable technique. Improved aeration in the lung bases since yesterday, with mild atelectasis persisting. No new pulmonary parenchymal abnormalities. IMPRESSION: 1. New RIGHT jugular central venous catheter tip projects over the mid SVC. No acute complicating features. 2. Remaining support apparatus satisfactory. 3. Improved aeration in the lung bases with mild atelectasis persisting. No new abnormalities. Electronically Signed   By: Evangeline Dakin M.D.   On: 10/18/2020 11:41    Assessment/Plan 50 yo male 1 month s/p Whipple, right colectomy and SMV repair for  T3N0 neuroendocrine tumor. C/b postop hemorrhagic shock with DIC and subsequent diffuse small bowel necrosis, now s/p small bowel resection with external drainage of bile duct, pancreatic duct and stomach. - Continue abx for Klebsiella pneumonia, complete a 7-day course - Acute renal failure: on CRRT, nephrology following. - Continue TPN - G tube to gravity - Vent wean as tolerated, continue trach collar trials - Fentanyl dose increased - Vac change today, change MWF - VTE: SQH - Dispo: ICU    LOS: 28 days    Michaelle Birks, MD [facility] Surgery General, Hepatobiliary and Pancreatic Surgery 10/20/20 6:53 AM

## 2020-10-20 NOTE — Progress Notes (Signed)
Physical Therapy Treatment Patient Details Name: [name] MRN: [id number] DOB: 02-16-1971 Today's Date: 10/20/2020    History of Present Illness 50 yo male presenting 3/14 with neuroendocrine tumor of the head of the pancrease. s/p right hemicolectomy, end ileostomy, Whipple and patch angioplasty repair of SMV. postoperative hemorrhagic shock with DIC with extensive small bowel necrosis. All of small bowel has been resected, with external drainage of the bile duct, pancreas and stomach. Extubated 3/30. Reintubated 4/5. S/p tracheostomy 4/8. Started on CRRT 4/9. Started on trach collar trials 4/10.  PMH including AICD, heart failure, HTN, and dyslipidemia.    PT Comments    Pt assessed on PRVC, 30% FiO2, 5 PEEP. Seemingly improved alertness and cognition in comparison to prior sessions; following all one step commands and participatory. Session focused on bed level exercises for BUE/BLE strengthening and ROM. Due to issues with CRRT connection (RN present), deferred edge of bed this date. Will progress pt as tolerated.     Follow Up Recommendations  SNF;LTACH     Equipment Recommendations  Wheelchair (measurements PT);Wheelchair cushion (measurements PT);Hospital bed;Other (comment) (hoyer lift)    Recommendations for Other Services       Precautions / Restrictions Precautions Precautions: Fall Precaution Comments: Trach on vent; x2 JP drains at abdomen and x2 biliary drains; wound vac; A-line; CRRT Restrictions Weight Bearing Restrictions: No    Mobility  Bed Mobility               General bed mobility comments: Deferred, bed height to 60 degrees    Transfers                    Ambulation/Gait                 Stairs             Wheelchair Mobility    Modified Rankin (Stroke Patients Only)       Balance                                            Cognition Arousal/Alertness: Awake/alert Behavior During  Therapy: Flat affect Overall Cognitive Status: Difficult to assess                                 General Comments: Pt following all one step commands, nodding yes/no appropriately      Exercises General Exercises - Upper Extremity Shoulder Flexion: AAROM;Both;10 reps;Supine Elbow Flexion: AROM;Both;10 reps;Supine Elbow Extension: AROM;Both;10 reps;Supine General Exercises - Lower Extremity Ankle Circles/Pumps: AROM;Both;10 reps;Supine Short Arc Quad: AAROM;Both;10 reps;Supine Heel Slides: AROM;Both;10 reps;Supine Hip ABduction/ADduction: AAROM;Both;10 reps;Supine    General Comments        Pertinent Vitals/Pain Pain Assessment: No/denies pain Pain Descriptors / Indicators: Discomfort;Grimacing    Home Living                      Prior Function            PT Goals (current goals can now be found in the care plan section) Acute Rehab PT Goals PT Goal Formulation: Patient unable to participate in goal setting Time For Goal Achievement: 10/23/20 Potential to Achieve Goals: Fair Progress towards PT goals: Progressing toward goals    Frequency    Min 3X/week  PT Plan Frequency needs to be updated    Co-evaluation              AM-PAC PT "6 Clicks" Mobility   Outcome Measure  Help needed turning from your back to your side while in a flat bed without using bedrails?: Total Help needed moving from lying on your back to sitting on the side of a flat bed without using bedrails?: Total Help needed moving to and from a bed to a chair (including a wheelchair)?: Total Help needed standing up from a chair using your arms (e.g., wheelchair or bedside chair)?: Total Help needed to walk in hospital room?: Total Help needed climbing 3-5 steps with a railing? : Total 6 Click Score: 6    End of Session Equipment Utilized During Treatment: Oxygen Activity Tolerance: Patient tolerated treatment well Patient left: in bed;with call bell/phone  within reach;with nursing/sitter in room;with SCD's reapplied Nurse Communication: Mobility status PT Visit Diagnosis: Muscle weakness (generalized) (M62.81);Difficulty in walking, not elsewhere classified (R26.2);Pain Pain - part of body:  (generalized)     Time: 7209-1980 PT Time Calculation (min) (ACUTE ONLY): 17 min  Charges:  $Therapeutic Exercise: 8-22 mins                     Wyona Almas, PT, DPT Acute Rehabilitation Services Pager [phone] Office [phone]    Deno Etienne 10/20/2020, 4:19 PM

## 2020-10-21 ENCOUNTER — Inpatient Hospital Stay (HOSPITAL_COMMUNITY): Payer: Managed Care, Other (non HMO)

## 2020-10-21 DIAGNOSIS — J9601 Acute respiratory failure with hypoxia: Secondary | ICD-10-CM | POA: Diagnosis not present

## 2020-10-21 LAB — PHOSPHORUS: Phosphorus: 3.1 mg/dL (ref 2.5–4.6)

## 2020-10-21 LAB — COMPREHENSIVE METABOLIC PANEL
ALT: 98 U/L — ABNORMAL HIGH (ref 0–44)
AST: 96 U/L — ABNORMAL HIGH (ref 15–41)
Albumin: 1.7 g/dL — ABNORMAL LOW (ref 3.5–5.0)
Alkaline Phosphatase: 124 U/L (ref 38–126)
Anion gap: 11 (ref 5–15)
BUN: 64 mg/dL — ABNORMAL HIGH (ref 6–20)
CO2: 22 mmol/L (ref 22–32)
Calcium: 8.3 mg/dL — ABNORMAL LOW (ref 8.9–10.3)
Chloride: 98 mmol/L (ref 98–111)
Creatinine, Ser: 2.67 mg/dL — ABNORMAL HIGH (ref 0.61–1.24)
GFR, Estimated: 28 mL/min — ABNORMAL LOW (ref >60)
Glucose, Bld: 208 mg/dL — ABNORMAL HIGH (ref 70–99)
Potassium: 3.2 mmol/L — ABNORMAL LOW (ref 3.5–5.1)
Sodium: 131 mmol/L — ABNORMAL LOW (ref 135–145)
Total Bilirubin: 4 mg/dL — ABNORMAL HIGH (ref 0.3–1.2)
Total Protein: 7.8 g/dL (ref 6.5–8.1)

## 2020-10-21 LAB — CBC
HCT: 31.2 % — ABNORMAL LOW (ref 39.0–52.0)
Hemoglobin: 10.3 g/dL — ABNORMAL LOW (ref 13.0–17.0)
MCH: 28.6 pg (ref 26.0–34.0)
MCHC: 33 g/dL (ref 30.0–36.0)
MCV: 86.7 fL (ref 80.0–100.0)
Platelets: 411 10*3/uL — ABNORMAL HIGH (ref 150–400)
RBC: 3.6 MIL/uL — ABNORMAL LOW (ref 4.22–5.81)
RDW: 17.7 % — ABNORMAL HIGH (ref 11.5–15.5)
WBC: 19.2 10*3/uL — ABNORMAL HIGH (ref 4.0–10.5)
nRBC: 0.9 % — ABNORMAL HIGH (ref 0.0–0.2)

## 2020-10-21 LAB — RENAL FUNCTION PANEL
Albumin: 1.6 g/dL — ABNORMAL LOW (ref 3.5–5.0)
Anion gap: 14 (ref 5–15)
BUN: 89 mg/dL — ABNORMAL HIGH (ref 6–20)
CO2: 23 mmol/L (ref 22–32)
Calcium: 8.6 mg/dL — ABNORMAL LOW (ref 8.9–10.3)
Chloride: 97 mmol/L — ABNORMAL LOW (ref 98–111)
Creatinine, Ser: 3.46 mg/dL — ABNORMAL HIGH (ref 0.61–1.24)
GFR, Estimated: 21 mL/min — ABNORMAL LOW (ref >60)
Glucose, Bld: 194 mg/dL — ABNORMAL HIGH (ref 70–99)
Phosphorus: 4.8 mg/dL — ABNORMAL HIGH (ref 2.5–4.6)
Potassium: 3.5 mmol/L (ref 3.5–5.1)
Sodium: 134 mmol/L — ABNORMAL LOW (ref 135–145)

## 2020-10-21 LAB — MAGNESIUM: Magnesium: 2.5 mg/dL — ABNORMAL HIGH (ref 1.7–2.4)

## 2020-10-21 LAB — PROTIME-INR
INR: 1.9 — ABNORMAL HIGH (ref 0.8–1.2)
Prothrombin Time: 22.1 seconds — ABNORMAL HIGH (ref 11.4–15.2)

## 2020-10-21 LAB — GLUCOSE, CAPILLARY
Glucose-Capillary: 161 mg/dL — ABNORMAL HIGH (ref 70–99)
Glucose-Capillary: 161 mg/dL — ABNORMAL HIGH (ref 70–99)
Glucose-Capillary: 163 mg/dL — ABNORMAL HIGH (ref 70–99)
Glucose-Capillary: 187 mg/dL — ABNORMAL HIGH (ref 70–99)
Glucose-Capillary: 192 mg/dL — ABNORMAL HIGH (ref 70–99)
Glucose-Capillary: 201 mg/dL — ABNORMAL HIGH (ref 70–99)
Glucose-Capillary: 213 mg/dL — ABNORMAL HIGH (ref 70–99)

## 2020-10-21 MED ORDER — SODIUM CHLORIDE 0.9% FLUSH
5.0000 mL | Freq: Three times a day (TID) | INTRAVENOUS | Status: DC
  Administered 2020-10-21 – 2020-11-23 (×94): 5 mL

## 2020-10-21 MED ORDER — PIPERACILLIN-TAZOBACTAM IN DEX 2-0.25 GM/50ML IV SOLN
2.2500 g | Freq: Three times a day (TID) | INTRAVENOUS | Status: DC
  Administered 2020-10-21: 2.25 g via INTRAVENOUS
  Filled 2020-10-21 (×2): qty 50

## 2020-10-21 MED ORDER — ZINC CHLORIDE 1 MG/ML IV SOLN
INTRAVENOUS | Status: DC
  Filled 2020-10-21: qty 1246.4

## 2020-10-21 MED ORDER — PIPERACILLIN-TAZOBACTAM IN DEX 2-0.25 GM/50ML IV SOLN
2.2500 g | Freq: Three times a day (TID) | INTRAVENOUS | Status: DC
  Administered 2020-10-21 – 2020-10-25 (×13): 2.25 g via INTRAVENOUS
  Filled 2020-10-21 (×15): qty 50

## 2020-10-21 MED ORDER — POTASSIUM CHLORIDE 10 MEQ/50ML IV SOLN
10.0000 meq | INTRAVENOUS | Status: AC
  Administered 2020-10-21 (×3): 10 meq via INTRAVENOUS
  Filled 2020-10-21 (×3): qty 50

## 2020-10-21 MED ORDER — LIDOCAINE-EPINEPHRINE 1 %-1:100000 IJ SOLN
INTRAMUSCULAR | Status: AC
  Filled 2020-10-21: qty 1

## 2020-10-21 MED ORDER — ZINC CHLORIDE 1 MG/ML IV SOLN
INTRAVENOUS | Status: AC
  Filled 2020-10-21: qty 1246.4

## 2020-10-21 MED ORDER — HEPARIN SODIUM (PORCINE) 5000 UNIT/ML IJ SOLN
5000.0000 [IU] | Freq: Three times a day (TID) | INTRAMUSCULAR | Status: DC
  Administered 2020-10-22 – 2020-11-04 (×38): 5000 [IU] via SUBCUTANEOUS
  Filled 2020-10-21 (×36): qty 1

## 2020-10-21 MED ORDER — FENTANYL CITRATE (PF) 100 MCG/2ML IJ SOLN
INTRAMUSCULAR | Status: AC
  Administered 2020-10-21: 25 ug via INTRAVENOUS
  Filled 2020-10-21: qty 2

## 2020-10-21 MED ORDER — SODIUM CHLORIDE 0.9 % IV SOLN
200.0000 mg | Freq: Once | INTRAVENOUS | Status: AC
  Administered 2020-10-21: 200 mg via INTRAVENOUS
  Filled 2020-10-21: qty 200

## 2020-10-21 MED ORDER — MIDAZOLAM HCL 2 MG/2ML IJ SOLN
INTRAMUSCULAR | Status: AC
  Administered 2020-10-21: 0.5 mg via INTRAVENOUS
  Filled 2020-10-21: qty 2

## 2020-10-21 MED ORDER — SODIUM CHLORIDE 0.9 % IV SOLN
100.0000 mg | INTRAVENOUS | Status: DC
  Administered 2020-10-22 – 2020-10-28 (×7): 100 mg via INTRAVENOUS
  Filled 2020-10-21 (×9): qty 100

## 2020-10-21 NOTE — Procedures (Signed)
Pre procedural Dx: Post op fluid collection Post procedural Dx: Same  Technically successful CT guided placed of a 10 Fr drainage catheter placement into dominant post op hematoma within the root of the abdominal mesentery yielding only 2 cc of bloody fluid despite attempted lavage aspiration.  All aspirated fluid was sent to the laboratory for analysis.    EBL: Trace Complications: None immediate  Ronny Bacon, MD Pager #: [phone]

## 2020-10-21 NOTE — Progress Notes (Signed)
Pt transported to CT and back to 4N 30 on full vent support with no complications noted.

## 2020-10-21 NOTE — Progress Notes (Signed)
Inpatient Diabetes Program Recommendations  AACE/ADA: New Consensus Statement on Inpatient Glycemic Control   Target Ranges:  Prepandial:   less than 140 mg/dL      Peak postprandial:   less than 180 mg/dL (1-2 hours)      Critically ill patients:  140 - 180 mg/dL   Results for [name], [name] (MRN [id number]) as of 10/21/2020 11:15  Ref. Range 10/20/2020 08:00 10/20/2020 12:02 10/20/2020 15:51 10/20/2020 19:59 10/21/2020 00:19 10/21/2020 04:06 10/21/2020 07:51  Glucose-Capillary Latest Ref Range: 70 - 99 mg/dL 180 (H) 160 (H) 195 (H) 182 (H) 187 (H) 201 (H) 213 (H)   Review of Glycemic Control  Current orders for Inpatient glycemic control: Novolog 0-20 units Q4H, Novolog 2 units Q4H, TPN @ 95 ml/hr with 50 units of regular insulin  Inpatient Diabetes Program Recommendations:    Insulin: Please consider increasing Novolog for TPN coverage to 4 units Q4H.  Thanks, Barnie Alderman, RN, MSN, CDE Diabetes Coordinator Inpatient Diabetes Program [phone] (Team Pager from 8am to 5pm)

## 2020-10-21 NOTE — Progress Notes (Signed)
PHARMACY - TOTAL PARENTERAL NUTRITION CONSULT NOTE  Indication:  Short bowel syndrome  Patient Measurements: Height: 6\' 2"  (188 cm) Weight: 124.9 kg (275 lb 5.7 oz) IBW/kg (Calculated) : 82.2 TPN AdjBW (KG): 95.3 Body mass index is 35.35 kg/m.  Assessment:  70 YOM with history of mass adjacent to head of the pancreas in 2021. 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  Developed cholecystitis in Feb 2022 and had percutaneous cholecystectomy tube placement.  Presented this admission for neuroendocrine tumor resection with Whipple procedure, also had right total colectomy, colostomy and cholecystectomy on 09/28/2020. SMV was lacerated during procedure and repaired by bovine pericardial patch. Developed hemorrhagic shock with bile leak post-op and underwent placement of biliary T tube with abdominal washout on 3/15 PM. Abdomen remains open. Pharmacy consulted to manage TPN.  Glucose / Insulin: no hx DM, A1c 5.2%. CBGs 182-213.  Utilized 39 units SSI in the past 24h hours, 50 units in TPN, Novolog 2 units Q4H (hold for CBG < 150).   Electrolytes: Na down to 131, K down to 3.2, Phos up to 3.1, CO2 normalized (max acetate in TPN), CoCa 9.7 (iCa 1.16 low normal on 4/7), others WNL Renal: CRRT started 4/9 > 4/11, transitioned to iHD with first session tentatively planned for 4/13, BUN up to 64 Hepatic: LFTs mildly elevated, tbili 3.9 (scleral yellowing still noted per RN 4/3) Albumin 1.5, prealbumin 22.8 (4/11) TG peaked at 740, now at 299 on 4/11 - Intralipids 100g/wk and monitor TG trends Intake / Output; MIVF: UOP 0.4 ml/kg/hr, drain (4 total): 1461mL, 150 cc from gastrostomy, net -9L GI Imaging:  4/6 CT - pelvic fluid collection concerning for abscess GI Surgeries / Procedures:  3/14 Whipple procedure with R hemicolectomy and end ileostomy, cholecystectomy 3/15 washout, VAC placement 3/17 washout, VAC replacement.  Necrosis of entire small bowel except for the PB limb  and proximal 40-50cm of jejunum 3/19 re-opening laparotomy, abd closure.  Necrotic SB. 3/21 SB resection, take down of choledochojejunal anastomosis and pancreaticojejunal anastomosis, take down of duodenojejunal anastomosis, placement of externalized biliary drain / Stamm gastrostomy tube, IA washout and placement of intraperitoneal drains 3/30 extubation 4/8 trach  Central access: R IJ CVC placed 3/15; changed to PICC placed 10/03/20 TPN start date: 09/18/2020  Nutritional Goals (RD rec on 3/30): 2800-3000 kCal, 185-210g AA, fluid >2L/day  Current Nutrition:  TPN  Plan:  - Adjust TPN to provide volume for electrolytes adjustments - Concentrated TPN at 95 ml/hr - Intralipid 20% 283mL Mon/Thurs for now to prevent EFAD - TPN with Intralipid 2x/wk provides a daily average of 187g AA and 2869 kCal, meeting 100% of patient needs.  - Electrolytes in TPN: Increase Na to 169mEq/L, continue K 10 meQ/L, Remove Phos, Ca 42mEq/L, Continue Mag 32mEq/L, max acetate - Daily multivitamin in TPN.  Remove standard trace elements and add back zinc 5mg , and selenium 21mcg.  Remove chromium while on RRT. - MD has ordered 4 runs of IV KCl  - Continue resistant SSI Q4H, 50 units regular insulin in TPN, Novolog 2 units SQ Q6H. - Standard TPN labs and TG on Mon/Thurs  Albertina Parr, PharmD., BCPS, BCCCP Clinical Pharmacist Please refer to [facility] for unit-specific pharmacist

## 2020-10-21 NOTE — Addendum Note (Signed)
Encounter addended by: Richrd Sox F on: 10/21/2020 4:25 PM  Actions taken: Narrator Event Log accessed

## 2020-10-21 NOTE — Progress Notes (Signed)
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 s/p right hemicolectomy, end ileostomy, Whipple and patch angioplasty repair of SMV. postoperative hemorrhagic shock with DIC with extensive small bowel necrosis. All of small bowel has been resected, with external drainage of the bile duct, pancreas and stomach. Extubated 3/30. Reintubated 4/5. S/p tracheostomy 4/8. Started on CRRT 4/9. Started on trach collar trials 4/10.  Discontinued CRRT 4/11. PMH including AICD, heart failure, HTN, and dyslipidemia.    PT Comments    Pt demonstrates improvement in cognition, activity tolerance, and participation in therapy session. Pt requiring two person mod-maximal assist for bed mobility and able to sit on edge of bed today. SpO2 98% on 35% ATC. Continues with generalized weakness, decreased endurance, balance impairments. In light of progress above and successful weaning trials, updated d/c recommendation to CIR.   Vitals: BP supine 96/75 BP sitting 86/60 BP sitting after a few minutes: 95/64 BP return to supine: 101/72   Follow Up Recommendations  CIR     Equipment Recommendations  Wheelchair (measurements PT);Wheelchair cushion (measurements PT);Hospital bed;Other (comment) (hoyer lift)    Recommendations for Other Services Rehab consult     Precautions / Restrictions Precautions Precautions: Fall Precaution Comments: Trach collar; x2 JP drains at abdomen and x2 biliary drains; wound vac Restrictions Weight Bearing Restrictions: No    Mobility  Bed Mobility Overal bed mobility: Needs Assistance Bed Mobility: Supine to Sit;Sit to Supine     Supine to sit: Mod assist;+2 for physical assistance;HOB elevated Sit to supine: Max assist;+2 for physical assistance   General bed mobility comments: Mod A  +2 to bring BLEs towards EOB and then elevate trunk. Max A for sitting balance at EOB due to poor activity tolerance and jerky movements with repositioning. Max A +2 for retur nto supine    Transfers Overall transfer level: Needs assistance Equipment used: 2 person hand held assist Transfers: Sit to/from Stand Sit to Stand: Max assist;+2 safety/equipment;+2 physical assistance;From elevated surface         General transfer comment: Max A +2 to achieve minimal hip clearance from edge of bed. Pt unable to achieve standing position  Ambulation/Gait                 Stairs             Wheelchair Mobility    Modified Rankin (Stroke Patients Only)       Balance Overall balance assessment: Needs assistance Sitting-balance support: Bilateral upper extremity supported;Feet unsupported Sitting balance-[name]: Poor Sitting balance - Comments: Max A for sitting balance   Standing balance support: Bilateral upper extremity supported;During functional activity Standing balance-[name]: Zero Standing balance comment: Max A +2 to maintain standing                            Cognition Arousal/Alertness: Awake/alert Behavior During Therapy: Flat affect Overall Cognitive Status: Difficult to assess                                 General Comments: Pt following simple commands consistently. Noting decreased attention with fatigue      Exercises      General Comments General comments (skin integrity, edema, etc.): VSS on trach  collar. BP soft: supine 96/75, sitting 86/60, sitting after a few minutes 95/64, and supine 101/72      Pertinent Vitals/Pain Pain Assessment: Faces Faces Pain Scale: Hurts little more Pain Location: Generalized with movement Pain Descriptors / Indicators: Discomfort;Grimacing Pain Intervention(s): Limited activity within patient's tolerance;Monitored during session    Home Living                       Prior Function            PT Goals (current goals can now be found in the care plan section) Acute Rehab PT Goals Patient Stated Goal: to participate with PT PT Goal Formulation: With patient Time For Goal Achievement: 10/23/20 Potential to Achieve Goals: Fair Progress towards PT goals: Progressing toward goals    Frequency    Min 3X/week      PT Plan Frequency needs to be updated;Discharge plan needs to be updated    Co-evaluation PT/OT/SLP Co-Evaluation/Treatment: Yes Reason for Co-Treatment: Complexity of the patient's impairments (multi-system involvement);For patient/therapist safety;To address functional/ADL transfers PT goals addressed during session: Mobility/safety with mobility OT goals addressed during session: ADL's and self-care      AM-PAC PT "6 Clicks" Mobility   Outcome Measure  Help needed turning from your back to your side while in a flat bed without using bedrails?: A Lot Help needed moving from lying on your back to sitting on the side of a flat bed without using bedrails?: A Lot Help needed moving to and from a bed to a chair (including a wheelchair)?: Total Help needed standing up from a chair using your arms (e.g., wheelchair or bedside chair)?: Total Help needed to walk in hospital room?: Total Help needed climbing 3-5 steps with a railing? : Total 6 Click Score: 8    End of Session Equipment Utilized During Treatment: Oxygen Activity Tolerance: Patient tolerated treatment well Patient left: in bed;with call bell/phone within reach;with nursing/sitter in room;with SCD's reapplied Nurse Communication: Mobility status PT Visit Diagnosis: Muscle weakness (generalized) (M62.81);Difficulty in walking, not elsewhere classified (R26.2);Pain     Time: 0923-3007 PT Time Calculation (min) (ACUTE ONLY): 29 min  Charges:  $Therapeutic Activity: 8-22 mins                     Wyona Almas, PT, DPT Acute Rehabilitation Services Pager  [phone] Office [phone]    Deno Etienne 10/21/2020, 4:16 PM

## 2020-10-21 NOTE — Consult Note (Signed)
Chief Complaint: Patient was seen in consultation today for intra-abdominal fluid collection/aspiration and drainage.  Referring Physician(s): [name] ([place])  Supervising Physician: Arne Cleveland  Patient Status: [facility] - In-pt  History of Present Illness: [name] is a 50 y.o. male with a past medical history of hypertension, dyslipidemia, nonischemic cardiomyopathy, history of vfib s/p AICD, HD, nephrolithiasis, and neuroendocrine tumor of pancreas. He was directly admitted to [facility] 10/06/2020 for management of neuroendocrine tumor of pancreas. He underwent whipple procedure with hemicolectomy in OR by Dr. Zenia Resides 09/15/2020. Hospital course complicated by hemorrhagic shock with DIC and subsequent diffuse small bowel necrosis s/p small bowel resection 09/09/2020, poor airway protection/acute hypoxemic respiratory failure s/p tracheostomy 10/20/2020, acute kidney injury requiring dialysis (on CRRT beginning 10/18/2020, switched to intermittent HD 10/21/2020). Repeat CT abdomen/pelvis today revealed intra-abdominal fluid collection.  CT abdomen/pelvis today: 1. Stable sequela of Whipple, total small bowel resection and subtotal colectomy with stable positioning of multiple surgical drains, pancreatic duct catheter and balloon retention gastrostomy tube. 2. No new definable/drainable fluid collections within the abdomen or pelvis. 3. Unchanged ill-defined fluid collections within the left side of the root of the abdominal mesentery, the ventral aspect of the abdominal mesentery, the subcapsular aspect of the right lobe of the liver and the pelvic cul-de-sac all of which are incompletely evaluated on this noncontrast examination though may represent areas of evolving hematomas giving imaging stability for the past week. 4. Redemonstrated ill-defined stranding surrounding the remaining portions of the pancreas without definable/drainable fluid collection on this noncontrast examination. 5.  Improved aeration of the lung bases with persistent consolidative opacities within the right lower lobe, nonspecific though could be seen in the setting of aspiration.  IR consulted by Dr. Zenia Resides for possible image-guided intra-abdominal fluid collection aspiration/possible drain placement. Patient awake and alert sitting in bed, tracheostomy in place. No passy-muir valve in place- history difficult to obtain secondary to this.  LD SQ Heparin 0516 this AM.   Past Medical History:  Diagnosis Date  . AICD (automatic cardioverter/defibrillator) present   . Dyslipidemia   . Heart failure with mildly reduced EF    EF 35-40 in 2011 // Echo 06/2019: EF 45-50, Gr 2 DD, RAE, mild MR, mild TR, mild PI, RVSP 33.6  . History of kidney stones   . HTN (hypertension)   . Hx of ventricular fibrillation    Hx of VFib arrest s/p AICD placement  . Nonischemic cardiomyopathy     Past Surgical History:  Procedure Laterality Date  . APPLICATION OF WOUND VAC N/A 09/10/2020   Procedure: APPLICATION OF WOUND VAC;  Surgeon: [name], MD;  Location: [place];  Service: General;  Laterality: N/A;  . APPLICATION OF WOUND VAC N/A 10/01/2020   Procedure: APPLICATION OF WOUND VAC;  Surgeon: [name], MD;  Location: [place];  Service: General;  Laterality: N/A;  . APPLICATION OF WOUND VAC  10/08/2020   Procedure: APPLICATION OF WOUND VAC; PLACEMENT OF NEGATIVE PRESSURE DRESSING;  Surgeon: [name], MD;  Location: [place];  Service: General;;  . BOWEL RESECTION  09/27/2020   Procedure: SMALL BOWEL RESECTION;  Surgeon: [name], MD;  Location: [place];  Service: General;;  . BOWEL RESECTION N/A 09/16/2020   Procedure: SMALL BOWEL RESECTION;  Surgeon: [name], MD;  Location: [place];  Service: General;  Laterality: N/A;  . CARDIAC DEFIBRILLATOR PLACEMENT    . CHOLECYSTECTOMY N/A 09/14/2020   Procedure: CHOLECYSTECTOMY;  Surgeon: [name],  MD;  Location: [place];  Service: General;  Laterality:  N/A;  . COLECTOMY N/A 09/19/2020   Procedure: RIGHT TOTAL COLECTOMY;  Surgeon: [name], MD;  Location: [place];  Service: General;  Laterality: N/A;  . COLOSTOMY  09/11/2020   Procedure: COLOSTOMY;  Surgeon: [name], MD;  Location: [place];  Service: General;;  . ERCP N/A 05/27/2020   Procedure: ENDOSCOPIC RETROGRADE CHOLANGIOPANCREATOGRAPHY (ERCP);  Surgeon: Clarene Essex, MD;  Location: [place];  Service: Endoscopy;  Laterality: N/A;  . ESOPHAGOGASTRODUODENOSCOPY (EGD) WITH PROPOFOL N/A 06/25/2020   Procedure: ESOPHAGOGASTRODUODENOSCOPY (EGD) WITH PROPOFOL;  Surgeon: Arta Silence, MD;  Location: [facility];  Service: Endoscopy;  Laterality: N/A;  . FINE NEEDLE ASPIRATION N/A 06/25/2020   Procedure: FINE NEEDLE ASPIRATION (FNA) LINEAR;  Surgeon: Arta Silence, MD;  Location: [facility];  Service: Endoscopy;  Laterality: N/A;  . GASTROSTOMY N/A 10/01/2020   Procedure: INSERTION OF GASTROSTOMY TUBE;  Surgeon: [name], MD;  Location: [place];  Service: General;  Laterality: N/A;  . IR ANGIOGRAM SELECTIVE EACH ADDITIONAL VESSEL  09/30/2020  . IR ANGIOGRAM SELECTIVE EACH ADDITIONAL VESSEL  10/07/2020  . IR ANGIOGRAM SELECTIVE EACH ADDITIONAL VESSEL  10/08/2020  . IR ANGIOGRAM VISCERAL SELECTIVE  09/24/2020  . IR PERC CHOLECYSTOSTOMY  08/27/2020  . LAPAROTOMY N/A 10/08/2020   Procedure: REOPENING OF LAPAROTOMY ABDOMINAL WASHOUT;  Surgeon: [name], MD;  Location: [place];  Service: General;  Laterality: N/A;  . LAPAROTOMY N/A 09/13/2020   Procedure: RE-EXPLORATORY LAPAROTOMY;  Surgeon: [name], MD;  Location: [place];  Service: General;  Laterality: N/A;  . LAPAROTOMY N/A 10/02/2020   Procedure: RE-EXPLORATORY LAPAROTOMY; ABDOMINAL WASHOUT;  Surgeon: [name], MD;  Location: [place];  Service: General;  Laterality: N/A;  . LAPAROTOMY N/A 09/24/2020   Procedure: RE -EXPLORATORY LAPAROTOMY AND [place];  Surgeon: [name], MD;  Location: [place];  Service:  General;  Laterality: N/A;  . LAPAROTOMY N/A 10/05/2020   Procedure: RE-EXPLORATORY LAPAROTOMY;  Surgeon: [name], MD;  Location: [place];  Service: General;  Laterality: N/A;  . PACEMAKER IMPLANT    . PATCH ANGIOPLASTY N/A 10/07/2020   Procedure: PATCH ANGIOPLASTY SUPERIOR MESENTERIC VEIN;  Surgeon: Elam Dutch, MD;  Location: [facility] OR;  Service: Vascular;  Laterality: N/A;  . RADIOLOGY WITH ANESTHESIA N/A 09/19/2020   Procedure: RADIOLOGY WITH ANESTHESIA;  Surgeon: Radiologist, Medication, MD;  Location: [place];  Service: Radiology;  Laterality: N/A;  . REMOVAL OF STONES  05/27/2020   Procedure: REMOVAL OF STONES;  Surgeon: Clarene Essex, MD;  Location: [facility] ENDOSCOPY;  Service: Endoscopy;;  . repeat echo  2008    demonstrated near normalization  . SPHINCTEROTOMY  05/27/2020   Procedure: SPHINCTEROTOMY;  Surgeon: Clarene Essex, MD;  Location[facility];  Service: Endoscopy;;  . TRACHEOSTOMY TUBE PLACEMENT N/A 10/22/2020   Procedure: TRACHEOSTOMY;  Surgeon: [name], MD;  Location: [place];  Service: General;  Laterality: N/A;  . TRANSTHORACIC ECHOCARDIOGRAM  11/09/2005, 01/15/2009, 02/07/2010  . UPPER ESOPHAGEAL ENDOSCOPIC ULTRASOUND (EUS) N/A 06/25/2020   Procedure: UPPER ESOPHAGEAL ENDOSCOPIC ULTRASOUND (EUS);  Surgeon: Arta Silence, MD;  Location: [facility];  Service: Endoscopy;  Laterality: N/A;  . WHIPPLE PROCEDURE N/A 09/14/2020   Procedure: WHIPPLE PROCEDURE;  Surgeon: [name], MD;  Location: [place];  Service: General;  Laterality: N/A;  ROOM 2 STARTING AT 07:30AM FOR 380 MIN    Allergies: Ace inhibitors  Medications: Prior to Admission medications  Medication Sig Start Date End Date Taking? Authorizing Provider  acetaminophen (TYLENOL) 500 MG tablet Take 2 tablets (1,000 mg total) by mouth every 6 (six) hours. Patient taking differently: Take 1,000 mg by mouth every 6 (six) hours as needed for mild pain. 08/22/20  Yes [name], MD  allopurinol  (ZYLOPRIM) 100 MG tablet Take one tablet by mouth daily for 1 week and then 2 tablets by mouth daily Patient taking differently: Take 200 mg by mouth daily. 07/03/20  Yes Swords, Darrick Penna, MD  amiodarone (PACERONE) 200 MG tablet Take 1 tablet (200 mg total) by mouth daily. Patient taking differently: Take 200 mg by mouth daily with supper. 07/30/20  Yes Evans Lance, MD  aspirin 81 MG tablet Take 1 tablet (81 mg total) by mouth 2 (two) times daily. Patient taking differently: Take 81 mg by mouth daily. 09/22/15  Yes Funches, Josalyn, MD  bisoprolol (ZEBETA) 10 MG tablet Take 1 tablet by mouth once daily Patient taking differently: Take 10 mg by mouth daily. 08/25/20  Yes Evans Lance, MD  budesonide-formoterol [facility]) 80-4.5 MCG/ACT inhaler Inhale 2 puffs into the lungs 2 (two) times daily. Must keep upcoming office visit for refills Patient taking differently: Inhale 2 puffs into the lungs 2 (two) times daily as needed (for flares). 11/09/19  Yes Fulp, Cammie, MD  sacubitril-valsartan (ENTRESTO) 24-26 MG Take 1 tablet by mouth 2 (two) times daily. 06/25/20  Yes Evans Lance, MD  amiodarone (PACERONE) 200 MG tablet TAKE 1 TABLET (200 MG TOTAL) BY MOUTH DAILY. 07/30/20 07/30/21  Evans Lance, MD  amiodarone (PACERONE) 200 MG tablet TAKE 1 TABLET (200 MG TOTAL) BY MOUTH DAILY. 11/15/19 11/14/20  Evans Lance, MD  bisoprolol (ZEBETA) 10 MG tablet TAKE 1 TABLET (10 MG TOTAL) BY MOUTH DAILY. 11/15/19 11/14/20  Evans Lance, MD  oxyCODONE (OXY IR/ROXICODONE) 5 MG immediate release tablet Take 1-2 tablets (5-10 mg total) by mouth every 6 (six) hours as needed for severe pain. Patient not taking: Reported on 09/10/2020 08/22/20   [name], MD     Family History  Problem Relation Age of Onset  . Diabetes Father   . Heart Problems Father        unsure of what kind  . Asthma Maternal Uncle   . Diabetes Maternal Grandmother     Social History   Socioeconomic History  . Marital status:  Married    Spouse name: Not on file  . Number of children: Not on file  . Years of education: Not on file  . Highest education level: Not on file  Occupational History    Employer: [employer]  Tobacco Use  . Smoking status: Former Smoker    Packs/day: 0.25    Years: 2.00    Pack years: 0.50    Types: Cigarettes    Quit date: 07/12/1998    Years since quitting: 22.2  . Smokeless tobacco: Never Used  Vaping Use  . Vaping Use: Never used  Substance and Sexual Activity  . Alcohol use: No    Comment: Denies  . Drug use: No  . Sexual activity: Not on file  Other Topics Concern  . Not on file  Social History Narrative   The patient is married.  He is in native of [place],     living in [place] for 9 years.  He has a history of tobacco use,     but quit smoking a year ago.  Denies alcohol abuse.  Social Determinants of Health   Financial Resource Strain: Not on file  Food Insecurity: Not on file  Transportation Needs: Not on file  Physical Activity: Not on file  Stress: Not on file  Social Connections: Not on file     Review of Systems: A 12 point ROS discussed and pertinent positives are indicated in the HPI above.  All other systems are negative.  Review of Systems  Reason unable to perform ROS: Tracheostomy.    Vital Signs: BP 112/71   Pulse (!) 102   Temp 98.9 F (37.2 C) (Oral)   Resp (!) 26   Ht 6\' 2"  (1.88 m)   Wt 227 lb 8.2 oz (103.2 kg)   SpO2 97%   BMI 29.21 kg/m   Physical Exam Vitals and nursing note reviewed.  Constitutional:      General: He is not in acute distress.    Comments: Tracheostomy.  Cardiovascular:     Rate and Rhythm: Normal rate and regular rhythm.     Heart sounds: Normal heart sounds. No murmur heard.   Pulmonary:     Effort: Pulmonary effort is normal. No respiratory distress.     Breath sounds: Normal breath sounds. No wheezing.     Comments: Tracheostomy. Abdominal:     Comments: (+) midline wound, woundvac in  place. (+) three right surgical drains, one left surgical drain, gastrostomy tube to gravity.  Skin:    General: Skin is warm and dry.  Neurological:     Mental Status: He is alert.     Comments: Tracheostomy.      MD Evaluation Airway: Other (comments) (Tracheostomy.) Heart: WNL Abdomen: WNL Chest/ Lungs: WNL ASA  Classification: 3 Mallampati/Airway Score: Three (Tracheostomy.)   Imaging: CT ABDOMEN PELVIS WO CONTRAST  Result Date: 10/21/2020 CLINICAL DATA:  History neuroendocrine tumor, post Whipple with intraoperative SMV laceration, ultimately with complete resection of the small bowel, now with postoperative abdominal pain and leukocytosis. EXAM: CT ABDOMEN AND PELVIS WITHOUT CONTRAST TECHNIQUE: Multidetector CT imaging of the abdomen and pelvis was performed following the standard protocol without IV contrast. COMPARISON:  CT the chest, abdomen pelvis-10/15/2020 FINDINGS: Lower chest: Limited visualization of the lower thorax demonstrates improved aeration of the lung bases with persistent consolidative opacities within the right lower lobe with associated air bronchograms. Ill-defined somewhat nodular tree-in-bud opacities are seen within the left lower lobe though improved compared to the 10/15/2020 examination. No new focal airspace opacities. No pleural effusion. Normal heart size. No pericardial effusion. Pacer lead terminates within the right ventricular apex. No pericardial effusion. Hepatobiliary: Normal hepatic contour. Redemonstrated approximately 7.6 x 6.0 x 2.6 cm fluid collection about the subcapsular aspect of the posterior segment of the right lobe of the liver (coronal image 58, series 6; axial image 31, series 3). Post cholecystectomy. Pancreas: Redemonstrated pancreatic drain within the remaining portions of the pancreas. A minimal amount of ill-defined fluid surrounds the pancreas though without definable/drainable fluid collection on this noncontrast examination.  Spleen: Redemonstrated borderline splenomegaly with the spleen measuring 15 cm in length (image 22, series 3). Adrenals/Urinary Tract: Redemonstrated apparent asymmetric atrophy of the right kidney in comparison to the left. No renal stones. No urine obstruction. Normal noncontrast appearance the bilateral adrenal glands. Normal noncontrast appearance of the urinary bladder given degree of distention. Stomach/Bowel: Sequela Whipple and gastrojejunostomy with absence of the entirety the of small-bowel and ascending colon. Stable positioning balloon retention gastrostomy tube and multiple surgical drainage catheters. No new definable/drainable fluid collection within the abdomen  or pelvis. Redemonstrated fluid collection with the pelvic cul-de-sac measuring approximately 4.9 x 4.9 cm (image 81, series 3) grossly unchanged compared to the 10/15/2020 examination, previously, 5.4 x 5.2 cm. Ill-defined fluid collection/hematoma within the left side of the root of the abdominal mesentery is unchanged measuring approximately 11.0 x 10.1 x 3.4 cm (coronal image 39, series 6; axial image 49, series 3), previously, 10.7 x 10.3 x 3.7 cm Separate ill-defined serpiginous fluid collection within the ventral aspect of the abdominal mesentery is also unchanged measuring approximately 4.0 x 3.2 x 2.2 cm (axial image 48, series 3; coronal image 25, series 6). Vascular/Lymphatic: Normal caliber the abdominal aorta. No bulky retroperitoneal, mesenteric, pelvic or inguinal lymphadenopathy on this noncontrast examination. Reproductive: Normal appearance the prostate gland. Other: Open wound within the ventral aspect of the right mid abdomen. Nodular areas of subcutaneous stranding within the left ventral abdominal wall likely at the location of subcutaneous medication administration. Mild diffuse body wall anasarca. Musculoskeletal: No acute or aggressive osseous abnormalities. IMPRESSION: 1. Stable sequela of Whipple, total small bowel  resection and subtotal colectomy with stable positioning of multiple surgical drains, pancreatic duct catheter and balloon retention gastrostomy tube. 2. No new definable/drainable fluid collections within the abdomen or pelvis. 3. Unchanged ill-defined fluid collections within the left side of the root of the abdominal mesentery, the ventral aspect of the abdominal mesentery, the subcapsular aspect of the right lobe of the liver and the pelvic cul-de-sac all of which are incompletely evaluated on this noncontrast examination though may represent areas of evolving hematomas giving imaging stability for the past week. 4. Redemonstrated ill-defined stranding surrounding the remaining portions of the pancreas without definable/drainable fluid collection on this noncontrast examination. 5. Improved aeration of the lung bases with persistent consolidative opacities within the right lower lobe, nonspecific though could be seen in the setting of aspiration. Electronically Signed   By: [name] M.D.   On: 10/21/2020 10:28   DG Chest 1 View  Result Date: 10/18/2020 CLINICAL DATA:  Chronic ventilator dependent respiratory failure. Bedside central venous catheter placement. EXAM: PORTABLE CHEST 1 VIEW COMPARISON:  10/26/2020 and earlier. FINDINGS: The new RIGHT jugular central venous catheter tip projects over the mid SVC. No evidence of pneumothorax or mediastinal hematoma. RIGHT arm PICC tip projects over the cavoatrial junction, unchanged. Tracheostomy tube tip in satisfactory position above the carina. LEFT subclavian pacing defibrillator unchanged and appears intact. Cardiac silhouette upper normal in size to slightly enlarged for AP portable technique. Improved aeration in the lung bases since yesterday, with mild atelectasis persisting. No new pulmonary parenchymal abnormalities. IMPRESSION: 1. New RIGHT jugular central venous catheter tip projects over the mid SVC. No acute complicating features. 2. Remaining  support apparatus satisfactory. 3. Improved aeration in the lung bases with mild atelectasis persisting. No new abnormalities. Electronically Signed   By: Evangeline Dakin M.D.   On: 10/18/2020 11:41   CT HEAD WO CONTRAST  Result Date: 10/03/2020 CLINICAL DATA:  Mental status changes. Patient not regaining consciousness. EXAM: CT HEAD WITHOUT CONTRAST TECHNIQUE: Contiguous axial images were obtained from the base of the skull through the vertex without intravenous contrast. COMPARISON:  None. FINDINGS: Brain: The brain shows a normal appearance without evidence of malformation, atrophy, old or acute small or large vessel infarction, mass lesion, hemorrhage, hydrocephalus or extra-axial collection. Vascular: No hyperdense vessel. No evidence of atherosclerotic calcification. Skull: Normal.  No traumatic finding.  No focal bone lesion. Sinuses/Orbits: Scattered opacified ethmoid air cells. Some fluid in the  sphenoid sinus. Orbits negative. Other: None significant IMPRESSION: 1. Normal appearance of the brain itself. 2. Scattered opacified ethmoid air cells. Some fluid in the sphenoid sinus. Electronically Signed   By: Nelson Chimes M.D.   On: 10/03/2020 14:30   IR Angiogram Visceral Selective  Result Date: 09/18/2020 INDICATION: 50 year old male with severe postoperative bleeding following Whipple procedure. He was taken back to the operating room for abdominal washout and no definitive arterial source for bleeding was identified. However, the patient continues to lose blood requiring multi unit transfusion. He presents to interventional radiology for visceral arteriography and possible embolization if a bleeding source can be identified. EXAM: SELECTIVE VISCERAL ARTERIOGRAPHY; ADDITIONAL ARTERIOGRAPHY MEDICATIONS: None ANESTHESIA/SEDATION: Provided by general anesthesia CONTRAST:  36mL OMNIPAQUE IOHEXOL 300 MG/ML SOLN, 38mL OMNIPAQUE IOHEXOL 300 MG/ML SOLN FLUOROSCOPY TIME:  Fluoroscopy Time: 7 minutes 42  seconds (1742 mGy). COMPLICATIONS: None immediate. PROCEDURE: Informed consent was obtained from the patient following explanation of the procedure, risks, benefits and alternatives. The patient understands, agrees and consents for the procedure. All questions were addressed. A time out was performed prior to the initiation of the procedure. Maximal barrier sterile technique utilized including caps, mask, sterile gowns, sterile gloves, large sterile drape, hand hygiene, and Betadine prep. The right common femoral artery was interrogated with ultrasound and found to be widely patent. An image was obtained and stored for the medical record. Local anesthesia was attained by infiltration with 1% lidocaine. A small dermatotomy was made. Under real-time sonographic guidance, the vessel was punctured with a 21 gauge micropuncture needle. Using standard technique, the initial micro needle was exchanged over a 0.018 micro wire for a transitional 4 [place] micro sheath. The micro sheath was then exchanged over a 0.035 wire for a 5 French vascular sheath. A C2 cobra catheter was advanced over a Bentson wire and used to select the common trunk of the celiac artery and superior mesenteric artery. An arteriogram was performed. There is preferential filling of the celiac axis. The 5 French catheter was then advanced into the superior mesenteric artery over a glidewire. Arteriography was performed. There is severe vaso constriction of the SMA and its branches. No convincing evidence of active arterial bleeding on the initial images. The C2 cobra catheter was then advanced into the common hepatic artery. Arteriography was performed. The dorsal pancreatic artery is patent and unremarkable. The stump of the GDA appears grossly unremarkable. Significant vaso constriction throughout the proper hepatic and intrahepatic arteries. There appears to be an accessory left gastric artery arising distally from the segment 2 hepatic artery. A  renegade STC microcatheter was advanced over a Fathom 16 microwire into the gastroduodenal stump. Contrast injection was performed. The gastric stump is secure. No evidence of contrast extravasation. The microcatheter was removed. The 5 French catheter was brought back into the origin of the superior mesenteric artery. The microcatheter was reintroduced. The microcatheter was advanced into the origin of the middle colic artery. Contrast injection was performed demonstrating no evidence of active bleeding or arterial injury. The microcatheter was next advanced into the inferior pancreaticoduodenal artery and arteriography was performed. Again, no evidence of active bleeding or arterial injury. The microcatheter was advanced into the right middle colic artery and additional arteriography was performed. Again, no evidence of arterial injury or active bleeding. At this point, all of the vessels in the region were successfully interrogated with no evidence of active arterial bleeding. The procedure was terminated. The catheters and wires were removed. Hemostasis was attained with the assistance  of a Celt arterial closure device. IMPRESSION: Multi selective visceral arteriography demonstrates no evidence of active arterial bleeding or vascular injury at this time. Electronically Signed   By: Jacqulynn Cadet M.D.   On: 10/03/2020 19:21   IR Angiogram Selective Each Additional Vessel  Result Date: 09/21/2020 INDICATION: 50 year old male with severe postoperative bleeding following Whipple procedure. He was taken back to the operating room for abdominal washout and no definitive arterial source for bleeding was identified. However, the patient continues to lose blood requiring multi unit transfusion. He presents to interventional radiology for visceral arteriography and possible embolization if a bleeding source can be identified. EXAM: SELECTIVE VISCERAL ARTERIOGRAPHY; ADDITIONAL ARTERIOGRAPHY MEDICATIONS: None  ANESTHESIA/SEDATION: Provided by general anesthesia CONTRAST:  69mL OMNIPAQUE IOHEXOL 300 MG/ML SOLN, 57mL OMNIPAQUE IOHEXOL 300 MG/ML SOLN FLUOROSCOPY TIME:  Fluoroscopy Time: 7 minutes 42 seconds (1742 mGy). COMPLICATIONS: None immediate. PROCEDURE: Informed consent was obtained from the patient following explanation of the procedure, risks, benefits and alternatives. The patient understands, agrees and consents for the procedure. All questions were addressed. A time out was performed prior to the initiation of the procedure. Maximal barrier sterile technique utilized including caps, mask, sterile gowns, sterile gloves, large sterile drape, hand hygiene, and Betadine prep. The right common femoral artery was interrogated with ultrasound and found to be widely patent. An image was obtained and stored for the medical record. Local anesthesia was attained by infiltration with 1% lidocaine. A small dermatotomy was made. Under real-time sonographic guidance, the vessel was punctured with a 21 gauge micropuncture needle. Using standard technique, the initial micro needle was exchanged over a 0.018 micro wire for a transitional 4 [place] micro sheath. The micro sheath was then exchanged over a 0.035 wire for a 5 French vascular sheath. A C2 cobra catheter was advanced over a Bentson wire and used to select the common trunk of the celiac artery and superior mesenteric artery. An arteriogram was performed. There is preferential filling of the celiac axis. The 5 French catheter was then advanced into the superior mesenteric artery over a glidewire. Arteriography was performed. There is severe vaso constriction of the SMA and its branches. No convincing evidence of active arterial bleeding on the initial images. The C2 cobra catheter was then advanced into the common hepatic artery. Arteriography was performed. The dorsal pancreatic artery is patent and unremarkable. The stump of the GDA appears grossly unremarkable.  Significant vaso constriction throughout the proper hepatic and intrahepatic arteries. There appears to be an accessory left gastric artery arising distally from the segment 2 hepatic artery. A renegade STC microcatheter was advanced over a Fathom 16 microwire into the gastroduodenal stump. Contrast injection was performed. The gastric stump is secure. No evidence of contrast extravasation. The microcatheter was removed. The 5 French catheter was brought back into the origin of the superior mesenteric artery. The microcatheter was reintroduced. The microcatheter was advanced into the origin of the middle colic artery. Contrast injection was performed demonstrating no evidence of active bleeding or arterial injury. The microcatheter was next advanced into the inferior pancreaticoduodenal artery and arteriography was performed. Again, no evidence of active bleeding or arterial injury. The microcatheter was advanced into the right middle colic artery and additional arteriography was performed. Again, no evidence of arterial injury or active bleeding. At this point, all of the vessels in the region were successfully interrogated with no evidence of active arterial bleeding. The procedure was terminated. The catheters and wires were removed. Hemostasis was attained with the assistance of  a Celt arterial closure device. IMPRESSION: Multi selective visceral arteriography demonstrates no evidence of active arterial bleeding or vascular injury at this time. Electronically Signed   By: Jacqulynn Cadet M.D.   On: 09/12/2020 19:21   IR Angiogram Selective Each Additional Vessel  Result Date: 10/07/2020 INDICATION: 50 year old male with severe postoperative bleeding following Whipple procedure. He was taken back to the operating room for abdominal washout and no definitive arterial source for bleeding was identified. However, the patient continues to lose blood requiring multi unit transfusion. He presents to  interventional radiology for visceral arteriography and possible embolization if a bleeding source can be identified. EXAM: SELECTIVE VISCERAL ARTERIOGRAPHY; ADDITIONAL ARTERIOGRAPHY MEDICATIONS: None ANESTHESIA/SEDATION: Provided by general anesthesia CONTRAST:  52mL OMNIPAQUE IOHEXOL 300 MG/ML SOLN, 55mL OMNIPAQUE IOHEXOL 300 MG/ML SOLN FLUOROSCOPY TIME:  Fluoroscopy Time: 7 minutes 42 seconds (1742 mGy). COMPLICATIONS: None immediate. PROCEDURE: Informed consent was obtained from the patient following explanation of the procedure, risks, benefits and alternatives. The patient understands, agrees and consents for the procedure. All questions were addressed. A time out was performed prior to the initiation of the procedure. Maximal barrier sterile technique utilized including caps, mask, sterile gowns, sterile gloves, large sterile drape, hand hygiene, and Betadine prep. The right common femoral artery was interrogated with ultrasound and found to be widely patent. An image was obtained and stored for the medical record. Local anesthesia was attained by infiltration with 1% lidocaine. A small dermatotomy was made. Under real-time sonographic guidance, the vessel was punctured with a 21 gauge micropuncture needle. Using standard technique, the initial micro needle was exchanged over a 0.018 micro wire for a transitional 4 [place] micro sheath. The micro sheath was then exchanged over a 0.035 wire for a 5 French vascular sheath. A C2 cobra catheter was advanced over a Bentson wire and used to select the common trunk of the celiac artery and superior mesenteric artery. An arteriogram was performed. There is preferential filling of the celiac axis. The 5 French catheter was then advanced into the superior mesenteric artery over a glidewire. Arteriography was performed. There is severe vaso constriction of the SMA and its branches. No convincing evidence of active arterial bleeding on the initial images. The C2 cobra  catheter was then advanced into the common hepatic artery. Arteriography was performed. The dorsal pancreatic artery is patent and unremarkable. The stump of the GDA appears grossly unremarkable. Significant vaso constriction throughout the proper hepatic and intrahepatic arteries. There appears to be an accessory left gastric artery arising distally from the segment 2 hepatic artery. A renegade STC microcatheter was advanced over a Fathom 16 microwire into the gastroduodenal stump. Contrast injection was performed. The gastric stump is secure. No evidence of contrast extravasation. The microcatheter was removed. The 5 French catheter was brought back into the origin of the superior mesenteric artery. The microcatheter was reintroduced. The microcatheter was advanced into the origin of the middle colic artery. Contrast injection was performed demonstrating no evidence of active bleeding or arterial injury. The microcatheter was next advanced into the inferior pancreaticoduodenal artery and arteriography was performed. Again, no evidence of active bleeding or arterial injury. The microcatheter was advanced into the right middle colic artery and additional arteriography was performed. Again, no evidence of arterial injury or active bleeding. At this point, all of the vessels in the region were successfully interrogated with no evidence of active arterial bleeding. The procedure was terminated. The catheters and wires were removed. Hemostasis was attained with the assistance of a  Celt arterial closure device. IMPRESSION: Multi selective visceral arteriography demonstrates no evidence of active arterial bleeding or vascular injury at this time. Electronically Signed   By: Jacqulynn Cadet M.D.   On: 09/28/2020 19:21   IR Angiogram Selective Each Additional Vessel  Result Date: 09/09/2020 INDICATION: 50 year old male with severe postoperative bleeding following Whipple procedure. He was taken back to the operating  room for abdominal washout and no definitive arterial source for bleeding was identified. However, the patient continues to lose blood requiring multi unit transfusion. He presents to interventional radiology for visceral arteriography and possible embolization if a bleeding source can be identified. EXAM: SELECTIVE VISCERAL ARTERIOGRAPHY; ADDITIONAL ARTERIOGRAPHY MEDICATIONS: None ANESTHESIA/SEDATION: Provided by general anesthesia CONTRAST:  40mL OMNIPAQUE IOHEXOL 300 MG/ML SOLN, 76mL OMNIPAQUE IOHEXOL 300 MG/ML SOLN FLUOROSCOPY TIME:  Fluoroscopy Time: 7 minutes 42 seconds (1742 mGy). COMPLICATIONS: None immediate. PROCEDURE: Informed consent was obtained from the patient following explanation of the procedure, risks, benefits and alternatives. The patient understands, agrees and consents for the procedure. All questions were addressed. A time out was performed prior to the initiation of the procedure. Maximal barrier sterile technique utilized including caps, mask, sterile gowns, sterile gloves, large sterile drape, hand hygiene, and Betadine prep. The right common femoral artery was interrogated with ultrasound and found to be widely patent. An image was obtained and stored for the medical record. Local anesthesia was attained by infiltration with 1% lidocaine. A small dermatotomy was made. Under real-time sonographic guidance, the vessel was punctured with a 21 gauge micropuncture needle. Using standard technique, the initial micro needle was exchanged over a 0.018 micro wire for a transitional 4 [place] micro sheath. The micro sheath was then exchanged over a 0.035 wire for a 5 French vascular sheath. A C2 cobra catheter was advanced over a Bentson wire and used to select the common trunk of the celiac artery and superior mesenteric artery. An arteriogram was performed. There is preferential filling of the celiac axis. The 5 French catheter was then advanced into the superior mesenteric artery over a  glidewire. Arteriography was performed. There is severe vaso constriction of the SMA and its branches. No convincing evidence of active arterial bleeding on the initial images. The C2 cobra catheter was then advanced into the common hepatic artery. Arteriography was performed. The dorsal pancreatic artery is patent and unremarkable. The stump of the GDA appears grossly unremarkable. Significant vaso constriction throughout the proper hepatic and intrahepatic arteries. There appears to be an accessory left gastric artery arising distally from the segment 2 hepatic artery. A renegade STC microcatheter was advanced over a Fathom 16 microwire into the gastroduodenal stump. Contrast injection was performed. The gastric stump is secure. No evidence of contrast extravasation. The microcatheter was removed. The 5 French catheter was brought back into the origin of the superior mesenteric artery. The microcatheter was reintroduced. The microcatheter was advanced into the origin of the middle colic artery. Contrast injection was performed demonstrating no evidence of active bleeding or arterial injury. The microcatheter was next advanced into the inferior pancreaticoduodenal artery and arteriography was performed. Again, no evidence of active bleeding or arterial injury. The microcatheter was advanced into the right middle colic artery and additional arteriography was performed. Again, no evidence of arterial injury or active bleeding. At this point, all of the vessels in the region were successfully interrogated with no evidence of active arterial bleeding. The procedure was terminated. The catheters and wires were removed. Hemostasis was attained with the assistance of a Celt  arterial closure device. IMPRESSION: Multi selective visceral arteriography demonstrates no evidence of active arterial bleeding or vascular injury at this time. Electronically Signed   By: Jacqulynn Cadet M.D.   On: 09/09/2020 19:21   DG CHEST  PORT 1 VIEW  Result Date: 10/20/2020 CLINICAL DATA:  Respiratory failure. EXAM: PORTABLE CHEST 1 VIEW COMPARISON:  October 18, 2020. FINDINGS: The heart size and mediastinal contours are within normal limits. No pneumothorax or pleural effusion is noted. Tracheostomy tube is in good position. Left-sided pacemaker is unchanged. Right internal jugular catheter is unchanged. Mild right basilar subsegmental atelectasis or infiltrate is noted. The visualized skeletal structures are unremarkable. IMPRESSION: Mild right basilar subsegmental atelectasis or infiltrate. Stable support apparatus. Electronically Signed   By: Marijo Conception M.D.   On: 10/20/2020 08:16   DG CHEST PORT 1 VIEW  Result Date: 11/08/2020 CLINICAL DATA:  Tracheostomy placement EXAM: PORTABLE CHEST 1 VIEW COMPARISON:  10/14/2020 FINDINGS: Tracheostomy placed in good position.  No pneumothorax Right arm PICC tip in the SVC. Progression of bibasilar airspace disease right greater than left. AICD unchanged in position. IMPRESSION: Satisfactory tracheostomy placement Progression of bibasilar airspace disease right greater than left. Possible atelectasis versus pneumonia. Electronically Signed   By: Franchot Gallo M.D.   On: 11/07/2020 14:57   DG CHEST PORT 1 VIEW  Result Date: 10/14/2020 CLINICAL DATA:  Hypoxia EXAM: PORTABLE CHEST 1 VIEW COMPARISON:  October 14, 2020 study obtained earlier in the day FINDINGS: Endotracheal tube tip is 3.9 cm above the carina. Central catheter tip is in the superior vena cava. Pacemaker lead attached to right ventricle. No adenopathy. Lungs clear. Heart size and pulmonary vascular normal. No adenopathy. No bone lesions. IMPRESSION: Tube and catheter positions as described without pneumothorax. Lungs clear. Cardiac silhouette normal. Electronically Signed   By: Lowella Grip III M.D.   On: 10/14/2020 09:38   DG CHEST PORT 1 VIEW  Result Date: 10/14/2020 CLINICAL DATA:  Shortness of breath. EXAM: PORTABLE CHEST 1  VIEW COMPARISON:  10/11/2020. FINDINGS: Right PICC line stable position. Cardiac pacer stable position. Heart size normal. Mild left base atelectasis/infiltrate. No pleural effusion or pneumothorax. Degenerative changes scoliosis thoracic spine. IMPRESSION: 1. Right PICC line stable position. Cardiac pacer stable position. Heart size stable. 2.  Mild left base atelectasis/infiltrate. Electronically Signed   By: Marcello Moores  Register   On: 10/14/2020 08:31   DG CHEST PORT 1 VIEW  Result Date: 10/11/2020 CLINICAL DATA:  Hypoxia EXAM: PORTABLE CHEST 1 VIEW COMPARISON:  10/03/2020 FINDINGS: Normal heart size and mediastinal contours. Defibrillator from the left. Reinflated left lower lobe since prior. There is no edema, consolidation, effusion, or pneumothorax. Right PICC with tip at the SVC. IMPRESSION: Improved left lower lobe aeration since prior. No acute or focal finding. Electronically Signed   By: Monte Fantasia M.D.   On: 10/11/2020 04:52   DG CHEST PORT 1 VIEW  Result Date: 10/03/2020 CLINICAL DATA:  Intubation EXAM: PORTABLE CHEST 1 VIEW COMPARISON:  Radiograph 09/26/2020 FINDINGS: Battery pack overlies left chest wall with pacer/defibrillator lead directed towards the cardiac apex. Right IJ vascular sheath with catheter tip in the mid SVC. Transesophageal tube tip and side port terminate below the margins of imaging beyond the GE junction. Telemetry leads and external support devices overlie the chest. Diffuse pulmonary vascular congestion. Mixed hazy and patchy opacities are present throughout the lungs which could reflect edema or airspace disease with more confluent opacity in the retrocardiac space. Left effusion not excluded. No visible right  effusion or pneumothorax. Additional areas of more bandlike opacity likely reflecting subsegmental atelectasis. Prominence of the cardiac silhouette may be related to portable technique and low volumes. No other acute osseous or soft tissue abnormality.  IMPRESSION: 1. Diffuse hazy and patchy opacities throughout the lungs with more confluent opacity in the retrocardiac space. Could reflect atelectasis, edema and/or airspace disease with layering effusion. 2. Additional areas of subsegmental atelectasis. 3. Support devices as above. Electronically Signed   By: Lovena Le M.D.   On: 10/03/2020 05:52   DG Chest Port 1 View  Result Date: 09/26/2020 CLINICAL DATA:  Respiratory failure. EXAM: PORTABLE CHEST 1 VIEW COMPARISON:  09/10/2020 FINDINGS: Stable heart size and appearance of pacing/ICD device. Stable positioning of endotracheal tube with tip approximately 3 cm above the carina. Gastric decompression tube extends below the diaphragm. Lungs show stable mild vascular congestion without evidence of overt airspace edema or pleural fluid. No pneumothorax identified. IMPRESSION: Stable mild vascular congestion. Electronically Signed   By: Aletta Edouard M.D.   On: 09/26/2020 07:55   DG CHEST PORT 1 VIEW  Result Date: 09/24/2020 CLINICAL DATA:  Hepatobiliary and pancreatic surgery, intubated EXAM: PORTABLE CHEST 1 VIEW COMPARISON:  09/24/2020 FINDINGS: Endotracheal tube, right central line, and left AICD remain in place, unchanged. Vascular congestion. Perihilar opacities may reflect edema. No visible effusions or pneumothorax. IMPRESSION: Mild vascular congestion and perihilar opacities, possibly edema. Electronically Signed   By: Rolm Baptise M.D.   On: 10/01/2020 08:11   DG Chest Port 1 View  Result Date: 09/24/2020 CLINICAL DATA:  Endotracheal tube EXAM: PORTABLE CHEST 1 VIEW COMPARISON:  Chest x-ray 10/03/2020 FINDINGS: Interval retraction of an endotracheal tube with tip terminating 4.5 cm above the carina. Right internal jugular central venous catheter with tip terminating overlying the expected region of the distal superior vena cava. Enteric tube coursing below the hemidiaphragm with tip collimated off view and side port overlying the expected  region of the gastric lumen. Left chest wall cardiac defibrillator in stable position. The heart size and mediastinal contours are unchanged. Persistent, slightly improved, bilateral patchy airspace opacities. No pulmonary edema. No pleural effusion. No pneumothorax. No acute osseous abnormality. IMPRESSION: 1. Persistent, slightly improved, bilateral patchy airspace opacities. 2. Lines and tubes in appropriate position. Electronically Signed   By: Iven Finn M.D.   On: 09/24/2020 04:51   DG CHEST PORT 1 VIEW  Result Date: 09/14/2020 CLINICAL DATA:  Intubated EXAM: PORTABLE CHEST 1 VIEW COMPARISON:  10/07/2020 FINDINGS: Endotracheal tube tip 2 cm above the carina. Orogastric or nasogastric tube enters the abdomen. Pacemaker defibrillator remains in place. Right internal jugular central line tip in the SVC 3 cm above the right atrium. Mild cardiomegaly as seen previously. Increased perihilar lung density, more on the right than the left. This could be due to acute edema or aspiration. IMPRESSION: Increased perihilar lung density, more on the right than the left. This could be due to acute edema or aspiration. Lines and tubes well positioned. Electronically Signed   By: Nelson Chimes M.D.   On: 09/26/2020 12:25   DG CHEST PORT 1 VIEW  Result Date: 09/13/2020 CLINICAL DATA:  Central line placement, postop blood pool procedure. EXAM: PORTABLE CHEST 1 VIEW COMPARISON:  02/01/2014 FINDINGS: Right internal jugular sheath tip projects over the brachiocephalic SVC confluence. No pneumothorax. Enteric tube in place with tip and side-port below the diaphragm in the stomach. Single lead left-sided pacemaker in place. Lower lung volumes. Normal heart size for technique. No pleural fluid  or pulmonary edema. No focal airspace disease. IMPRESSION: 1. Right internal jugular sheath tip projects over the brachiocephalic SVC confluence. No pneumothorax. 2. Enteric tube with tip and side-port below the diaphragm in the  stomach. Electronically Signed   By: Keith Rake M.D.   On: 10/02/2020 18:08   EEG adult  Result Date: 10/04/2020 Theodosia Blender, MD     10/04/2020 12:11 PM [facility] EEG HISTORY:  50 year old male presents with altered mentation. INTRODUCTION:  A digital EEG was performed in the laboratory using the standard international 10/20 system of electrode placement in addition to one channel of EKG monitoring.  Hyperventilation and Photic Stimulation were not  performed.  This tracing captures the patient in the intubated and sedated states.  DESCRIPTION OF RECORD:  In the intubated state there is diffuse slowing of the background consisting of 6 Hz frequency activity, which is very low amplitude.  Continuous generalized slowing with intermixed frequencies in the theta and delta ranges is seen throughout the record. Excessive electrode artifact is seen in the right temporal lead which limits interpretation of this region. Heart rate is 110 bpm. IMPRESSION:  This is an abnormal adult EEG in the intubated and sedated state.  Of note, this is a low amplitude recording and excessive electrode artifact is seen in the right temporal lead which limits interpretation of this region..  The slowing of the background rhythm and continuous generalized slowing are consistent with an underlying moderate encephalopathy.  Alternatively, it could be due to the effect of sedative medications or bilateral cerebral dysfunction.  No epileptiform abnormalities or electroencephalographic seizures are identified in this record.  This does not rule out the diagnosis of epilepsy.  If clinically indicated a repeat EEG or 24 hour EEG may be helpful. Clinical correlation is recommended.   ECHOCARDIOGRAM COMPLETE  Result Date: 09/26/2020    ECHOCARDIOGRAM REPORT   Patient Name:   AVREY HYSER Date of Exam: 09/26/2020 Medical Rec #:  [id number]       Height:       74.0 in Accession #:    [id number]      Weight:        210.0 lb Date of Birth:  1970/07/29      BSA:          2.219 m Patient Age:    67 years        BP:           108/62 mmHg Patient Gender: M               HR:           70 bpm. Exam Location:  Inpatient Procedure: 2D Echo Indications:    cardiomyopathy  History:        Patient has prior history of Echocardiogram examinations, most                 recent 05/25/2020. Defibrillator; Risk Factors:Hypertension and                 Dyslipidemia.  Sonographer:    Johny Chess Referring Phys: [id number] RAVI AGARWALA  Sonographer Comments: Echo performed with patient supine and on artificial respirator and Technically difficult study due to poor echo windows. IMPRESSIONS  1. Left ventricular ejection fraction, by estimation, is 40 to 45%. The left ventricle has mildly decreased function. The left ventricle demonstrates global hypokinesis. Left ventricular diastolic parameters are consistent with Grade I diastolic dysfunction (impaired relaxation).  2. Right ventricular  systolic function is normal. The right ventricular size is normal. Tricuspid regurgitation signal is inadequate for assessing PA pressure.  3. The mitral valve is grossly normal. Trivial mitral valve regurgitation.  4. The aortic valve is grossly normal. Aortic valve regurgitation is not visualized. No aortic stenosis is present.  5. The inferior vena cava is normal in size with greater than 50% respiratory variability, suggesting right atrial pressure of 3 mmHg. Comparison(s): A prior study was performed on 05/25/20. Prior images reviewed side by side. LV function has decreased slightly. FINDINGS  Left Ventricle: Wall thickness not well visualized for measurement. Left ventricular ejection fraction, by estimation, is 40 to 45%. The left ventricle has mildly decreased function. The left ventricle demonstrates global hypokinesis. The left ventricular internal cavity size was normal in size. Left ventricular diastolic parameters are consistent with Grade I  diastolic dysfunction (impaired relaxation). Right Ventricle: The right ventricular size is normal. Right vetricular wall thickness was not well visualized. Right ventricular systolic function is normal. Tricuspid regurgitation signal is inadequate for assessing PA pressure. Left Atrium: Left atrial size was normal in size. Right Atrium: Right atrial size was normal in size. Pericardium: There is no evidence of pericardial effusion. Mitral Valve: The mitral valve is grossly normal. Trivial mitral valve regurgitation. Tricuspid Valve: The tricuspid valve is grossly normal. Tricuspid valve regurgitation is trivial. Aortic Valve: The aortic valve is grossly normal. Aortic valve regurgitation is not visualized. No aortic stenosis is present. Pulmonic Valve: The pulmonic valve was not well visualized. Pulmonic valve regurgitation is trivial. Aorta: The aortic root is normal in size and structure. Venous: The inferior vena cava is normal in size with greater than 50% respiratory variability, suggesting right atrial pressure of 3 mmHg. IAS/Shunts: The interatrial septum was not well visualized. Additional Comments: A device lead is visualized in the right atrium and right ventricle.  LEFT VENTRICLE PLAX 2D LVIDd:         4.40 cm     Diastology LVIDs:         3.60 cm     LV e' medial:    7.18 cm/s LVOT diam:     2.00 cm     LV E/e' medial:  8.6 LV SV:         57          LV e' lateral:   13.50 cm/s LV SV Index:   26          LV E/e' lateral: 4.6 LVOT Area:     3.14 cm  LV Volumes (MOD) LV vol d, MOD A2C: 98.0 ml LV vol d, MOD A4C: 79.6 ml LV vol s, MOD A2C: 51.7 ml LV vol s, MOD A4C: 52.3 ml LV SV MOD A2C:     46.3 ml LV SV MOD A4C:     79.6 ml LV SV MOD BP:      37.0 ml RIGHT VENTRICLE             IVC RV S prime:     15.80 cm/s  IVC diam: 1.30 cm TAPSE (M-mode): 1.7 cm LEFT ATRIUM             Index       RIGHT ATRIUM           Index LA diam:        3.10 cm 1.40 cm/m  RA Area:     13.80 cm LA Vol (A2C):   48.5 ml 21.86  ml/m RA Volume:  31.90 ml  14.38 ml/m LA Vol (A4C):   40.2 ml 18.12 ml/m LA Biplane Vol: 46.4 ml 20.91 ml/m  AORTIC VALVE LVOT Vmax:   104.00 cm/s LVOT Vmean:  70.000 cm/s LVOT VTI:    0.182 m  AORTA Ao Root diam: 2.80 cm Ao Asc diam:  2.90 cm MITRAL VALVE MV Area (PHT): 3.37 cm    SHUNTS MV Decel Time: 225 msec    Systemic VTI:  0.18 m MV E velocity: 62.10 cm/s  Systemic Diam: 2.00 cm MV A velocity: 92.50 cm/s MV E/A ratio:  0.67 Cherlynn Kaiser MD Electronically signed by Cherlynn Kaiser MD Signature Date/Time: 09/26/2020/12:41:30 PM    Final    [place] EKG SITE RITE  Result Date: 10/02/2020 If Site Rite image not attached, placement could not be confirmed due to current cardiac rhythm.  CT CHEST ABDOMEN PELVIS WO CONTRAST  Result Date: 10/15/2020 CLINICAL DATA:  Fever, leukocytosis. EXAM: CT CHEST, ABDOMEN AND PELVIS WITHOUT CONTRAST TECHNIQUE: Multidetector CT imaging of the chest, abdomen and pelvis was performed following the standard protocol without IV contrast. COMPARISON:  August 21, 2020. FINDINGS: CT CHEST FINDINGS Cardiovascular: No significant vascular findings. Normal heart size. No pericardial effusion. Mediastinum/Nodes: Endotracheal tube is in good position. Thyroid gland is unremarkable. Esophagus is unremarkable. No adenopathy is noted. Lungs/Pleura: No pneumothorax or pleural effusion is noted. Large right lower lobe airspace opacity is noted concerning for pneumonia or possibly atelectasis. Mild left lower lobe opacity is noted concerning for pneumonia or atelectasis. Musculoskeletal: No chest wall mass or suspicious bone lesions identified. CT ABDOMEN PELVIS FINDINGS Hepatobiliary: Status post cholecystectomy. No biliary dilatation is noted. Probable small subcapsular fluid collection is seen posteriorly and inferiorly along the right hepatic lobe. Pancreas: Status post surgical resection of pancreatic head and mass. There appears to be a stent or catheter within the expected position  of pancreatic duct which is nondilated. This stent appears to be externalized. Swelling of the residual pancreas is noted which most likely is postoperative in etiology. Spleen: Normal in size without focal abnormality. Adrenals/Urinary Tract: Adrenal glands appear normal. Right renal atrophy is noted. No hydronephrosis or renal obstruction is noted. No renal or ureteral calculi are noted. Urinary bladder is unremarkable. Stomach/Bowel: Gastrostomy tube is seen well position within the distal gastric lumen. The patient appears to be status post right colectomy. At least 3 surgical drains are seen entering the right lower quadrant which extend to the right upper quadrant and porta hepatis region. No bowel dilatation is noted. The patient appears to have undergone significant small bowel resection is well. Vascular/Lymphatic: No significant vascular findings are present. No enlarged abdominal or pelvic lymph nodes. Reproductive: Prostate is unremarkable. Other: 5.4 x 5.2 cm fluid collection is noted in the posterior portion of the pelvis in the pre rectal space concerning for abscess. Small amount of gas and fluid is noted in the abdomen consistent with postoperative status. Large midline ventral surgical incision is noted. Musculoskeletal: No acute or significant osseous findings. IMPRESSION: Status post surgical resection of pancreatic head and mass. Patient also appears to be status post right colectomy and significant small bowel resection. 5.4 x 5.2 cm fluid collection is noted in the posterior portion of the pelvis in the pre rectal space concerning for abscess. Gastrostomy tube is in good position. At least 3 surgical drains are seen entering the right lower quadrant with tips directed toward the right upper quadrant and porta hepatis region. Also noted is apparent stent or catheter within the pancreatic  duct which is externalized. Bilateral posterior basilar airspace opacities are noted, right much larger than  left, concerning for pneumonia or possibly atelectasis. Electronically Signed   By: Marijo Conception M.D.   On: 10/15/2020 12:12    Labs:  CBC: Recent Labs    10/18/20 0606 10/18/20 1407 10/19/20 0522 10/20/20 0515 10/21/20 0511  WBC 13.3*  --  10.3 13.1* 19.2*  HGB 7.0* 7.7* 8.3* 9.7* 10.3*  HCT 22.0* 24.3* 25.6* 30.0* 31.2*  PLT 275  --  270 331 411*    COAGS: Recent Labs    09/15/2020 1228 09/30/2020 1947 09/24/20 0400 09/24/20 1650 09/18/2020 1909 10/08/2020 1624  INR 1.5* 0.9 1.2 1.4* 1.4* 1.4*  APTT 37* 35  --  32 39*  --     BMP: Recent Labs    12/03/19 1703 05/24/20 1956 07/03/20 1643 07/21/20 0921 08/21/20 1037 10/19/20 1545 10/20/20 0515 10/20/20 1643 10/21/20 0511  NA 144   < > 142 142   < > 134* 134* 133* 131*  K 4.6   < > 3.7 4.2   < > 3.4* 3.4* 3.4* 3.2*  CL 109*   < > 103 104   < > 100 99 100 98  CO2 21   < > 25 27   < > 27 22 23 22   GLUCOSE 103*   < > 105* 83   < > 206* 177* 195* 208*  BUN 15   < > 18 16   < > 54* 43* 40* 64*  CALCIUM 9.5   < > 9.1 9.5   < > 7.8* 8.1* 8.4* 8.3*  CREATININE 1.27   < > 0.96 1.19   < > 1.87* 1.61* 1.58* 2.67*  GFRNONAA 66   < > 92 71   < > 44* 52* 53* 28*  GFRAA 77  --  107 82  --   --   --   --   --    < > = values in this interval not displayed.    LIVER FUNCTION TESTS: Recent Labs    10/18/20 0606 10/18/20 1601 10/19/20 0522 10/19/20 1545 10/20/20 0515 10/20/20 1643 10/21/20 0511  BILITOT 3.7*  --  3.8*  --  3.9*  --  4.0*  AST 78*  --  95*  --  98*  --  96*  ALT 79*  --  84*  --  94*  --  98*  ALKPHOS 85  --  87  --  115  --  124  PROT 6.9  --  7.2  --  7.8  --  7.8  ALBUMIN 1.3*   < > 1.3* 1.4* 1.5* 1.6* 1.7*   < > = values in this interval not displayed.     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 Zenia Resides; complicated by hemorrhagic shock with DIC and subsequent diffuse small bowel necrosis s/p small bowel resection  11/27/8414; further complication by intra-abdominal fluid collection. Plan for image-guided intra-abdominal fluid collection aspiration with possible drain placement in IR tentatively for today pending IR schedule. Patient is NPO. Afebrile, leukocytosis. Heparin held per IR protocol. INR pending.  Risks and benefits discussed with the patient including bleeding, infection, damage to adjacent structures, bowel perforation/fistula connection, and sepsis. All of the patient's questions were answered, patient is agreeable to proceed. Consent obtained by patient's brother, signed and in IR control room.   Thank you for this interesting consult.  I  greatly enjoyed meeting Tavonte A Bellomo and look forward to participating in their care.  A copy of this report was sent to the requesting provider on this date.  Electronically Signed: Earley Abide, PA-C 10/21/2020, 2:29 PM   I spent a total of 40 Minutes in face to face in clinical consultation, greater than 50% of which was counseling/coordinating care for intra-abdominal fluid collection/aspiration and drainage.

## 2020-10-21 NOTE — Progress Notes (Signed)
Pharmacy Antibiotic Note  [name] is a 50 y.o. male admitted on 10/07/2020 with pneumonia and intra-abdominal infection.  Pharmacy has been consulted for zosyn dosing. Of note CRRT was stopped yesterday and now planning to transition to iHD   Given worsening leukocytosis and persistent intra-abdominal collections on CT abdomen, MD wants to continue antibiotics for now. Also adding anidulafungin to cover for fungal infection.   Plan: Change Zosyn 2.25 gm IV Q 8 hours Anidulafungin per MD   Height: 6\' 2"  (188 cm) Weight: 103.2 kg (227 lb 8.2 oz) IBW/kg (Calculated) : 82.2  Temp (24hrs), Avg:99.9 F (37.7 C), Min:98.6 F (37 C), Max:101.3 F (38.5 C)  Recent Labs  Lab 10/15/2020 0426 10/18/20 0606 10/18/20 1601 10/19/20 0522 10/19/20 1545 10/20/20 0515 10/20/20 1643 10/21/20 0511  WBC 11.4* 13.3*  --  10.3  --  13.1*  --  19.2*  CREATININE 4.45* 4.96*   < > 2.57* 1.87* 1.61* 1.58* 2.67*   < > = values in this interval not displayed.    Estimated Creatinine Clearance: 42.9 mL/min (A) (by C-G formula based on SCr of 2.67 mg/dL (H)).    Allergies  Allergen Reactions  . Ace Inhibitors Cough    Antimicrobials this admission: Vanc 3/18 >> 3/21 CTX 3/17 >> 3/21 Flagyl 3/17 >> 3/21 Zosyn 3/21 >> 3/30; 4/5>> Eraxis 3/22 >> 3/29; 4/12 >>   Dose adjustments this admission:   Microbiology results: 3/14 MRSA PCR - negative 3/23 BCx - negF 4/5 TA >> Rare klebsiella  4/12 BCx >>    Albertina Parr, PharmD., BCPS, BCCCP Clinical Pharmacist Please refer to [facility] for unit-specific pharmacist

## 2020-10-21 NOTE — Progress Notes (Signed)
4 Days Post-Op  Subjective: Patient is very alert this morning, asking questions. Febrile overnight to 38.5, WBC up to 19. CRRT stopped yesterday evening. Tolerating trach collar trials twice daily.   Objective: Vital signs in last 24 hours: Temp:  [98.6 F (37 C)-101.3 F (38.5 C)] 100.58 F (38.1 C) (04/12 0600) Pulse Rate:  [38-107] 100 (04/12 0600) Resp:  [17-29] 26 (04/12 0600) BP: (100-129)/(57-87) 116/77 (04/12 0600) SpO2:  [93 %-100 %] 95 % (04/12 0600) Arterial Line BP: (105-138)/(62-72) 105/62 (04/12 0600) FiO2 (%):  [30 %-35 %] 30 % (04/12 0217) Last BM Date:  (pta)  Intake/Output from previous day: 04/11 0701 - 04/12 0700 In: 2540.5 [I.V.:2370.5; IV Piggyback:150] Out: 2979 [Urine:895; Drains:1402] Intake/Output this shift: No intake/output data recorded.  PE: General: no acute distress Neuro: awake, follows commands HEENT: trach in place, site clean and dry. Scleral icterus. Resp: normal work of breathing, on vent Vent Mode: PRVC FiO2 (%):  [30 %-35 %] 30 % Set Rate:  [24 bmp] 24 bmp Vt Set:  [650 mL] 650 mL PEEP:  [5 cmH20] 5 cmH20 Plateau Pressure:  [21 cmH20-22 cmH20] 22 cmH20 CV: RRR Abdomen: soft, nondistended, wound vac in place on midline incision. JPx2 with milky brown fluid. Biliary drain with bile. LUQ pancreatic stent with clear colorless fluid. G tube in place to gravity LUQ.   Lab Results:  Recent Labs    10/20/20 0515 10/21/20 0511  WBC 13.1* 19.2*  HGB 9.7* 10.3*  HCT 30.0* 31.2*  PLT 331 411*   BMET Recent Labs    10/20/20 1643 10/21/20 0511  NA 133* 131*  K 3.4* 3.2*  CL 100 98  CO2 23 22  GLUCOSE 195* 208*  BUN 40* 64*  CREATININE 1.58* 2.67*  CALCIUM 8.4* 8.3*   PT/INR No results for input(s): LABPROT, INR in the last 72 hours. CMP     Component Value Date/Time   NA 131 (L) 10/21/2020 0511   NA 142 07/21/2020 0921   K 3.2 (L) 10/21/2020 0511   CL 98 10/21/2020 0511   CO2 22 10/21/2020 0511   GLUCOSE 208  (H) 10/21/2020 0511   BUN 64 (H) 10/21/2020 0511   BUN 16 07/21/2020 0921   CREATININE 2.67 (H) 10/21/2020 0511   CREATININE 1.16 03/01/2016 1607   CALCIUM 8.3 (L) 10/21/2020 0511   PROT 7.8 10/21/2020 0511   PROT 6.7 07/21/2020 0921   ALBUMIN 1.7 (L) 10/21/2020 0511   ALBUMIN 4.1 07/21/2020 0921   AST 96 (H) 10/21/2020 0511   ALT 98 (H) 10/21/2020 0511   ALKPHOS 124 10/21/2020 0511   BILITOT 4.0 (H) 10/21/2020 0511   BILITOT 0.8 07/21/2020 0921   GFRNONAA 28 (L) 10/21/2020 0511   GFRNONAA >89 09/22/2015 1008   GFRAA 82 07/21/2020 0921   GFRAA >89 09/22/2015 1008   Lipase     Component Value Date/Time   LIPASE 23 08/22/2020 0318       Studies/Results: DG CHEST PORT 1 VIEW  Result Date: 10/20/2020 CLINICAL DATA:  Respiratory failure. EXAM: PORTABLE CHEST 1 VIEW COMPARISON:  October 18, 2020. FINDINGS: The heart size and mediastinal contours are within normal limits. No pneumothorax or pleural effusion is noted. Tracheostomy tube is in good position. Left-sided pacemaker is unchanged. Right internal jugular catheter is unchanged. Mild right basilar subsegmental atelectasis or infiltrate is noted. The visualized skeletal structures are unremarkable. IMPRESSION: Mild right basilar subsegmental atelectasis or infiltrate. Stable support apparatus. Electronically Signed   By: Jeneen Rinks  Murlean Caller M.D.   On: 10/20/2020 08:16    Assessment/Plan 50 yo male 1 month s/p Whipple, right colectomy and SMV repair for T3N0 neuroendocrine tumor. C/b postop hemorrhagic shock with DIC and subsequent diffuse small bowel necrosis, now s/p small bowel resection with external drainage of bile duct, pancreatic duct and stomach. - Continue antibiotics for pneumonia - Fever and leukocytosis: noncon CT abd/pelvis today to evaluate for undrained intraabdominal fluid. Will also send blood cultures as patient has indwelling central lines. - Acute renal failure: Off CRRT, transition to iHD. - Continue TPN - G tube  to gravity - Vent wean as tolerated, continue trach collar trials - Vac change MWF - VTE: SQH - Dispo: ICU    LOS: 29 days    Michaelle Birks, MD [facility] Surgery General, Hepatobiliary and Pancreatic Surgery 10/21/20 7:43 AM

## 2020-10-21 NOTE — Progress Notes (Signed)
[name] Progress Note     Assessment/ Plan:   1.Acute renal failure - he has reasons to be in ATN With hypotensive episodes requiring Levophed in the setting of a very complicated surgical history with multiple visits to the OR + DIC + diffuse small bowel necrosis. Creatinine fluctuating represents compromised renal function with unfortunately repeated insults. No evidence of obstruction. continued purulent drainage from JP drain in abdomen.  - remains non-oliguric but BUN/Cr rising.  - Pt and family desire all aggressive interventions, confirmed 10/16/20 -startedCRRT 10/18/20-10/20/20 1800d/w PCCM/ CCS, all 4K bath, no heparin - Avoid nephrotoxins and dose medications for a GFR < 15 ml/min.  - Will continue monitoring for any signs of renal recovery; decent UOP overnight but renal function going wrong direction. Will re-evaluate tomorrow but if no signs of recovery then likely will need to challenge with iHD later in the week.   2. Respiratory failure reintubated now on the ventilator--> s/p trach in OR 10/10/2020  3. Neuroendocrine tumor s/p Whipple with unfortunate extensive small bowel necrosis s/p resection. Family is hopeful for a small bowel transplant, however this is quite a long ways off d/t multiple issues. CT 4/6 showing possible pelvic abscess, had been started on Zosyn  4. Severe malnutrition on TPN.  5. Anemia- Hgb 8.3   Subjective:   CRRT started 4/9 1600 and stopped 4/11 1800.  He denies n/v/dyspnea.    Objective:   BP 117/80   Pulse 99   Temp (!) 97.16 F (36.2 C)   Resp (!) 26   Ht 6\' 2"  (1.88 m)   Wt 103.2 kg   SpO2 94%   BMI 29.21 kg/m   Intake/Output Summary (Last 24 hours) at 10/21/2020 0945 Last data filed at 10/21/2020 0800 Gross per 24 hour  Intake 2747.84 ml  Output 3476 ml  Net -728.16 ml   Weight change:   Physical Exam: Gen: ill-appearing on the vent, awake and alert  CVS: tachycardic Resp: coarse  mechanical Abd: distended, pancreatic drain in place, biliary drain, 2 JPs and a drain from the colostomy stoma Ext: tr edema  Imaging: DG CHEST PORT 1 VIEW  Result Date: 10/20/2020 CLINICAL DATA:  Respiratory failure. EXAM: PORTABLE CHEST 1 VIEW COMPARISON:  October 18, 2020. FINDINGS: The heart size and mediastinal contours are within normal limits. No pneumothorax or pleural effusion is noted. Tracheostomy tube is in good position. Left-sided pacemaker is unchanged. Right internal jugular catheter is unchanged. Mild right basilar subsegmental atelectasis or infiltrate is noted. The visualized skeletal structures are unremarkable. IMPRESSION: Mild right basilar subsegmental atelectasis or infiltrate. Stable support apparatus. Electronically Signed   By: Marijo Conception M.D.   On: 10/20/2020 08:16    Labs: BMET Recent Labs  Lab 10/18/20 0606 10/18/20 1601 10/19/20 0522 10/19/20 1545 10/20/20 0515 10/20/20 1643 10/21/20 0511  NA 140 143 139 134* 134* 133* 131*  K 3.8 3.4* 3.0* 3.4* 3.4* 3.4* 3.2*  CL 110 111 106 100 99 100 98  CO2 18* 21* 25 27 22 23 22   GLUCOSE 225* 177* 167* 206* 177* 195* 208*  BUN 150* 152* 78* 54* 43* 40* 64*  CREATININE 4.96* 4.91* 2.57* 1.87* 1.61* 1.58* 2.67*  CALCIUM 8.1* 7.9* 7.7* 7.8* 8.1* 8.4* 8.3*  PHOS 4.6 4.6 1.8* 2.8 2.0* 2.2* 3.1   CBC Recent Labs  Lab 10/18/20 0606 10/18/20 1407 10/19/20 0522 10/20/20 0515 10/21/20 0511  WBC 13.3*  --  10.3 13.1* 19.2*  NEUTROABS  --   --   --  9.3*  --   HGB 7.0* 7.7* 8.3* 9.7* 10.3*  HCT 22.0* 24.3* 25.6* 30.0* 31.2*  MCV 87.0  --  86.2 85.7 86.7  PLT 275  --  270 331 411*    Medications:    . chlorhexidine  15 mL Mouth Rinse BID  . Chlorhexidine Gluconate Cloth  6 each Topical Daily  . heparin injection (subcutaneous)  5,000 Units Subcutaneous Q8H  . insulin aspart  0-20 Units Subcutaneous Q4H  . insulin aspart  2 Units Subcutaneous Q4H  . mouth rinse  15 mL Mouth Rinse q12n4p  . pantoprazole  (PROTONIX) IV  40 mg Intravenous BID  . sodium chloride flush  10-40 mL Intracatheter Q12H      Otelia Santee, MD 10/21/2020, 9:45 AM

## 2020-10-21 NOTE — Progress Notes (Signed)
OT Cancellation Note  Patient Details Name: [name] MRN: [id number] DOB: 03/15/1971   Cancelled Treatment:    Reason Eval/Treat Not Completed: Patient at procedure or test/ unavailable (Abdominal CT. Will return as schedule allows.)  [place], OTR/L Acute Rehab Pager: [phone] Office: [phone] 10/21/2020, 8:52 AM

## 2020-10-21 NOTE — Progress Notes (Signed)
  Speech Language Pathology Treatment: Nada Boozer Speaking valve  Patient Details Name: [name] MRN: [id number] DOB: Aug 04, 1970 Today's Date: 10/21/2020 Time: 1202-1222 SLP Time Calculation (min) (ACUTE ONLY): 20 min  Assessment / Plan / Recommendation Clinical Impression  Pt seen for PMV treatment on trach collar with increase in secretions noted today. Cuff deflation resulted in copious secretions with strong reflexive cough via trach and oral cavity. Partial clearance with continued coughs and required RN to deep suction. Once valve donned, it remained in place for duration of session only removed via SLP to check for upper airway mobilization of exhaled air. No indications of back pressure present. Today he was better able to speak on exhalation and support sentence level discourse with therapist and wife who arrived. Although not at baseline, his intensity and intelligibility was improved from yesterday. RR ranged 24-32, SpO2 99% and HR 102. Following deep suctioning RR and work of breathing increased and cues given to pause, take break to slow respirations. Slowly he has been able to increase time on trach collar. Recommend he wear valve intermittently with full supervision.   HPI HPI: 33 yr with history of mass adjacent to head of the pancreas in 2021.  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  Developed cholecystitis in Feb 2022 and had percutaneous cholecystectomy tube placement.  Presented this admission for neuroendocrine tumor resection with Whipple procedure, also had right total colectomy, colostomy and cholecystectomy on 09/16/2020. SMV was lacerated during procedure and repaired by bovine pericardial patch. Developed hemorrhagic shock with bile leak post-op and underwent placement of biliary T tube with abdominal washout on 3/15 PM. All of small bowel has been resected, with external drainage of the bile duct, pancreas and stomach. Pt on TPN. intubated  3/14-3/30.      SLP Plan  Continue with current plan of care       Recommendations         Patient may use Passy-Muir Speech Valve: Intermittently with supervision PMSV Supervision: Full         Oral Care Recommendations: Oral care QID Follow up Recommendations: 24 hour supervision/assistance;Skilled Nursing facility SLP Visit Diagnosis: Aphonia (R49.1) Plan: Continue with current plan of care                       Houston Siren 10/21/2020, 1:22 PM  Orbie Pyo Colvin Caroli.Ed Risk analyst [phone] Office [phone]

## 2020-10-21 NOTE — Progress Notes (Signed)
Pt placed on full vent support per MD order. Pt tolerated 4 hrs ATC. RN aware.

## 2020-10-21 NOTE — Progress Notes (Signed)
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 s/p right hemicolectomy, end ileostomy, Whipple and patch angioplasty repair of SMV. postoperative hemorrhagic shock with DIC with extensive small bowel necrosis. All of small bowel has been resected, with external drainage of the bile duct, pancreas and stomach. Extubated 3/30. Reintubated 4/5. S/p tracheostomy 4/8. Started on CRRT 4/9. Started on trach collar trials 4/10.  PMH including AICD, heart failure, HTN, and dyslipidemia.   OT comments  Pt progressing towards established OT goals. Pt continues to present with decreased strength, balance, and activity tolerance with increased fatigue. Pt very motivated to participate in therapy despite fatigue and agreeable to EOB. Pt performing bed mobility with Mod-Max A +2. Pt reporting dizziness with positional changes; BP soft but no significant changes. Return to supine. RN present and aware. Otherwise, VSS on trach collar. Continue to recommend dc to CIR and will continue to follow acutely as admitted.   BP: supine 96/75, sitting 86/60, sitting after a few minutes 95/64, and supine 101/72   Follow Up Recommendations  CIR    Equipment Recommendations  Other (comment) (Defer to next venue)    Recommendations for Other Services PT consult;Speech consult;Rehab consult    Precautions / Restrictions Precautions Precautions: Fall Precaution Comments: Trach collar; x2 JP drains at abdomen and x2 biliary drains; wound vac       Mobility Bed Mobility Overal bed mobility: Needs Assistance Bed Mobility: Supine to Sit;Sit to Supine     Supine to sit: Mod assist;+2 for physical assistance;HOB elevated Sit to supine: Max assist;+2 for physical assistance   General bed mobility comments: Mod A +2 to bring BLEs  towards EOB and then elevate trunk. Max A for sitting balance at EOB due to poor activity tolerance and jerky movements with repositioning. Max A +2 for retur nto supine    Transfers Overall transfer level: Needs assistance Equipment used: 2 person hand held assist Transfers: Sit to/from Stand Sit to Stand: Max assist;+2 safety/equipment;+2 physical assistance;From elevated surface         General transfer comment: Max A +2 for power up and to clear hips from    Balance Overall balance assessment: Needs assistance Sitting-balance support: Bilateral upper extremity supported;Feet unsupported Sitting balance-[name]: Poor Sitting balance - Comments: Max A for sitting balance   Standing balance support: Bilateral upper extremity supported;During functional activity Standing balance-[name]: Zero Standing balance comment: Max A +2 to maintain standing                           ADL either performed or assessed with clinical judgement   ADL Overall ADL's : Needs assistance/impaired                                     Functional mobility during ADLs: Maximal assistance;+2 for physical assistance;+2 for safety/equipment (sit<>stand at EOB) General ADL Comments: Focuses session on increasing activity tolerance and sitting balance. Pt performing bed mobility to then sit at EOB. Pt reporting dizziness and presents wit hsoft BP throughout. RN present. Returned to supine due to fatigue and dizziness.     Vision       Perception     Praxis  Cognition Arousal/Alertness: Awake/alert Behavior During Therapy: Flat affect Overall Cognitive Status: Difficult to assess                                 General Comments: Pt following simple commands consistently. Noting decreased attention with fatigue        Exercises     Shoulder Instructions       General Comments VSS on trach collar. BP soft: supine 96/75, sitting 86/60,  sitting after a few minutes 95/64, and supine 101/72    Pertinent Vitals/ Pain       Pain Assessment: Faces Faces Pain Scale: Hurts little more Pain Location: Generalized with movement Pain Descriptors / Indicators: Discomfort;Grimacing Pain Intervention(s): Monitored during session;Repositioned  Home Living                                          Prior Functioning/Environment              Frequency  Min 2X/week        Progress Toward Goals  OT Goals(current goals can now be found in the care plan section)  Progress towards OT goals: Progressing toward goals  Acute Rehab OT Goals Patient Stated Goal: to participate with PT ADL Goals Pt Will Perform Grooming: with min assist;sitting;bed level Pt Will Transfer to Toilet: with +2 assist;stand pivot transfer;bedside commode;with max assist Additional ADL Goal #1: Pt will perform bed mobility with Mod A +2 in preparation for ADLs Additional ADL Goal #2: Pt will tolerate sitting at EOB for 10 minutes with Min A in preparation for ADLs  Plan Discharge plan remains appropriate    Co-evaluation    PT/OT/SLP Co-Evaluation/Treatment: Yes Reason for Co-Treatment: For patient/therapist safety;To address functional/ADL transfers   OT goals addressed during session: ADL's and self-care      AM-PAC OT "6 Clicks" Daily Activity     Outcome Measure   Help from another person eating meals?: Total Help from another person taking care of personal grooming?: A Lot Help from another person toileting, which includes using toliet, bedpan, or urinal?: Total Help from another person bathing (including washing, rinsing, drying)?: Total Help from another person to put on and taking off regular upper body clothing?: Total Help from another person to put on and taking off regular lower body clothing?: Total 6 Click Score: 7    End of Session Equipment Utilized During Treatment: Oxygen  OT Visit Diagnosis:  Unsteadiness on feet (R26.81);Other abnormalities of gait and mobility (R26.89);Muscle weakness (generalized) (M62.81);Pain Pain - part of body:  (Generalized)   Activity Tolerance Patient tolerated treatment well   Patient Left in bed;with call bell/phone within reach;with family/visitor present;with nursing/sitter in room;with SCD's reapplied   Nurse Communication Mobility status        Time: 1030-1057 OT Time Calculation (min): 27 min  Charges: OT General Charges $OT Visit: 1 Visit OT Treatments $Therapeutic Activity: 8-22 mins  [employer], OTR/L Acute Rehab Pager: [phone] Office: [place] 10/21/2020, 1:57 PM

## 2020-10-21 NOTE — Progress Notes (Signed)
Pt placed on 35% ATC per wean protocol. RN aware. Rt to continue to monitor.

## 2020-10-21 NOTE — Progress Notes (Signed)
Pt transported back from CT to 4N30 accompanied by RN, Transporter, and RT. Reconnected to unit monitor. VSS. No s/s of distress at this time. Manuela Schwartz RN at bedside to receive pt. Handoff completed.

## 2020-10-21 NOTE — Progress Notes (Addendum)
[name], MRN:  [id number], DOB:  06-28-1971, LOS: 59 ADMISSION DATE:  09/15/2020, CONSULTATION DATE:  10/21/20 REFERRING MD:  Anesthesia, CHIEF COMPLAINT:  Whipple, post-op shock   Brief History:  50 y.o. M with PMH of neuroendocrine tumor and underwent Whipple procedure 3/14 with intra-operative SMV laceration and repair by vascular with bovine pericardial patch.  Pt had worsening shock overnight and required three pressors despite massive transfusion protocol (received 150 blood products).  He was taken back to the OR for washout on 3/15 and returned to the ICU intubated.  Taken back 3/17 again found to have small bowel necrosis s/p resection.  Plans again to return 3/19 to OR.   Totality of small bowel has been resected. The case was discussed with [facility] who may consider eval for sm intestinal transplant pending clinical course.  Continuing aggressive supportive care.   Past Medical History:   has a past medical history of AICD (automatic cardioverter/defibrillator) present, Dyslipidemia, Heart failure with mildly reduced EF, History of kidney stones, HTN (hypertension), ventricular fibrillation, and Nonischemic cardiomyopathy.  Significant Hospital Events:  3/14: Admit to Surgery, to OR for whipple, SMV injury and repair, massive transfusion protocol overnight  3/15: Back to OR for washout, x2. IR for arteriogram. No major bleed source identified in OR, or arterial bleed w IR. Robust product resuscitation >75 products (+ TXA + novoseven) 3/16: off pressors, add'l 39 products overnight. Slowing resuscitation this morning with hemodynamic improvements and slowing drain output; OR x 2 - washout, biliary t tube, wound vac -- washout, wound vac, IR for arteriogram -- no arterial bleed for embolization 3/17:  Aggressive balanced transfusion continue overnight with marked hemodynamic improvement since yesterday evening. Off pressors x several hours, Wound vac output significantly slowing  (from 597ml q15-69min to 563ml q1hr+) and output is much thinner, Thin bloody secretions from mouth approx 231ml, Decreased RR from 22 to 18, Unable to tolerate NE- severe bradycardia 3/19 taken back to OR. Small bowel noted to be almost completely necrosed. Patient was closed with plans for goals of car discussion.  3/20 GOC family endorses full scope of treatment. Primary team discussed with [facility] the possibility of transfer for small bowel transplant. [facility] felt as though transfer would not change outcome. 3/21 Ongoing discussion with family and [facility]. Patient may be a candidate for transplant if he can stabilize post a small bowel resection. He was taken back to the OR and small bowel was resected.  3/23 weaning pressors. Versed off. Remains encephalopathic 3/24 off pressors. Low dose dilaudid gtt -- only weakly grimaces to pain. Changing sedation to precedex + PRN fentanyl. Long discussion with 2 brothers regarding clinical case -- tried to clarify that while the term "stable" has been used, in this instance is meaning that he has not declined from previous shift but is in fact still critically ill with multisystem organ dysfunction.  3/25 Cr and BUN increased. Off sedation  3/26 Pressors off, CT head benign, BUN/Cr continue to creep up 3/27 Tachycardia/HTN overnight improved with fentanyl gtt 2/28 PCCM sign off. Trauma to take over vent/CCM needs.  3/30 extubated 4/2 desaturated overnight to the 30s improved with BVM and NTS.  4/5 poor airway protection. PCCM consulted for re-intubation.  4/6 CT a/p Large RLL opacity, smaller LLL opacity. Post op changes. 5.4cm fluid collection in posterior pelvis, concerning for abscess. Started on Zosyn  4/7 got transfused overnight and then diuresed. Worse renal function 4/9 transfused again. HD line inserted and started on  CRRT 4/10 started on trach collar trials. 4/12 off CRRT and transitioned to IHD.  Consults:  PCCM VVS  IR  Palliative  Procedures:   PICC 3/25 > JP x 2  Biliary drain Pancreatic stent drain ETT 2/24>>3/13; 3/15 >> 3/29;  4/5 >  Significant Diagnostic Tests:  See Radiology tab   Most recent significant exam was CT a/p 4/6: which revealed R>L pulmonary opacities, and pelvic fluid collection concerning for abscess as well as detailing the numerous post-op changes.   Micro Data:  See micro tab 4/5 trach asp >> Gram neg coccobacilli   Antimicrobials:  Zosyn 3/21 > 3/29; 4/5 >> Eraxis 3/23> 3/29  Interim History / Subjective:   Remains off vasopressors.  More awake today. Complains of dyspnea. Trach collar trial only completed later in the evening.   Objective   Blood pressure 116/77, pulse 100, temperature (!) 100.58 F (38.1 C), resp. rate (!) 26, height 6\' 2"  (1.88 m), weight 103.2 kg, SpO2 95 %.    Vent Mode: PRVC FiO2 (%):  [30 %-35 %] 30 % Set Rate:  [24 bmp] 24 bmp Vt Set:  [650 mL] 650 mL PEEP:  [5 cmH20] 5 cmH20 Plateau Pressure:  [21 cmH20-22 cmH20] 22 cmH20   Intake/Output Summary (Last 24 hours) at 10/21/2020 0747 Last data filed at 10/21/2020 0518 Gross per 24 hour  Intake 2540.52 ml  Output 3633 ml  Net -1092.48 ml   Filed Weights   10/01/20 1503 10/15/20 1503 10/19/20 0400  Weight: 124.9 kg 94.4 kg 103.2 kg   Examination:  General:  Obese man in no distress.  HEENT: [name]. Tracheostomy in place.  Mild scleral icterus, mucus membranes are moist. Neuro: Awakens to voice, follows commands. Generalized weakness but moves all limbs, 3/5 strength CV: rr with some PVCs. Cap refill < 3 seconds. S1s2, PULM:  Symmetrical chest expansion, chest clear bilaterally.   GI: Round, mild tenderness localized to surgical site. Multiple drains-- JPs with turbid fluid. VAC dressing in place. Extremities:.  No edema, significant muscle wasting. Skin: c/d/w no rash   Resolved Hospital Problem list   Hemorrhagic shock hypophosphatemia  Assessment & Plan:   Critically ill due to acute hypoxemic  respiratory failure requiring mechanical ventilation. due to generalized weakness and possible aspiration pneumonia -Continue daily SBT - trach collar today for 3-4 intervals bid with rest on full support in between. Complete trials during the day to allow uninterrupted sleep at nighttime.  - Start working with Passy-Muir valve.  -Complete 7 days of Zosyn - PT - up to chair today.   Pancreatic neuroendocrine tumor s/p whipple 3/21 b/c hemorrhagic shock, c/b small bowel necrosis now s/p total small bowel resection and externa; drainage for bile duct pancreatic duct and stomach.  - Continue TPN - Will need tunneled access for TPN to decrease infection risk. Will need to hold off for now given fever and leukocytosis.  - Family's continued hope is for transfer to [facility] for small bowel transplant. Suspect pt has quite a ways to go before stable enough to be considered, and am unsure if underlying conditions like NICM would preclude candidacy.   Fever and leukocytosis - Blood cultures and CT abdomen - Start empiric fungal coverage.   Was Critically ill due to septic shock requiring titration of NE, suspect septic in setting of PNA and possible pelvic abscess.  - Weaned off NE yesterday.  - Discontinue arterial line today.   AKI, worse since reintubation - possible hypotensive hit -Transition to IHD tomorrow.  -  Nausea appears to have improved.   Transaminitis, mild due to TPN - follow as improving.   Hyperglycemia due to stress hyperglycemia and TPN - rSSI   Anemia of critical illness  - follow CBC - Transfuse for hgb < 7.   NICM s/p ICD - EF has improved based on recent echocardiogram.  -Well compensated at this time.  - Lack of digestive tract precludes usual GDHFT.  [place] Suspect poor prognosis.  The family hopes for a small intestine transplant -- I think there are currently several acute barriers before the pt could even possibly be considered for potential candidacy   Daily  Goals Checklist  Pain/Anxiety/Delirium protocol (if indicated): PRN only for RASS goal 0 to -1 VAP protocol (if indicated): bundle in place.  DVT prophylaxis: SCD Nutrition Status: TPN - needs tunneled line.  GI prophylaxis: Pantoprazole Urinary catheter: external Central lines: PICC 3/25 Glucose control: -SSI + TPN Mobility/therapy needs: Mobilize to chair if possible Restraints: Restraint type:NA Reason for restraints:NA Daily labs: CBC, CMP daily Code Status: Partial - -no CV/DF (has an ICD however) Family Communication: per primary Disposition: ICU.  Goals of Care:  Last date of multidisciplinary goals of care discussion: 24/5 Family and staff present: Dr. Zenia Resides Summary of discussion: Family wishes for aggressive care.   Code Status:  Partial - no CV/DF by [place] (has an ICD which is not to be turned off)  CRITICAL CARE Performed by: Kipp Brood   Total critical care time: 40 minutes  Critical care time was exclusive of separately billable procedures and treating other patients. Critical care was necessary to treat or prevent imminent or life-threatening deterioration.  Critical care was time spent personally by me on the following activities: development of treatment plan with patient and/or surrogate as well as nursing, discussions with consultants, evaluation of patient's response to treatment, examination of patient, obtaining history from patient or surrogate, ordering and performing treatments and interventions, ordering and review of laboratory studies, ordering and review of radiographic studies, pulse oximetry and re-evaluation of patient's condition.  Kipp Brood, MD [facility] ICU Physician [place]  Pager: [phone] Or Epic Secure Chat After hours: [phone].  10/21/2020, 7:47 AM

## 2020-10-21 NOTE — Addendum Note (Signed)
Encounter addended by: Richrd Sox F on: 10/21/2020 4:25 PM  Actions taken: Narrator Event Log accessed, Event accepted in Narrator

## 2020-10-21 NOTE — Addendum Note (Signed)
Encounter addended by: Richrd Sox F on: 10/21/2020 4:07 PM  Actions taken: Narrator Event Log accessed

## 2020-10-21 NOTE — Progress Notes (Signed)
IR narrator down. CT guided LLQ drain placed.   Procedure start time: 1600 Procedure end time: 8675  Pt tolerated well. No s/s of distress throughout procedure. Vitals signs remained stable. No complications noted. Pt follows commands and denies and pain and discomfort at this time. Report called to [employer].

## 2020-10-21 NOTE — Progress Notes (Signed)
Pt transported to CT and back to 4N 30 on full vent support. No complications noted.

## 2020-10-22 DIAGNOSIS — D3A8 Other benign neuroendocrine tumors: Secondary | ICD-10-CM | POA: Diagnosis not present

## 2020-10-22 LAB — COMPREHENSIVE METABOLIC PANEL
ALT: 96 U/L — ABNORMAL HIGH (ref 0–44)
AST: 86 U/L — ABNORMAL HIGH (ref 15–41)
Albumin: 1.6 g/dL — ABNORMAL LOW (ref 3.5–5.0)
Alkaline Phosphatase: 105 U/L (ref 38–126)
Anion gap: 13 (ref 5–15)
BUN: 112 mg/dL — ABNORMAL HIGH (ref 6–20)
CO2: 25 mmol/L (ref 22–32)
Calcium: 8.6 mg/dL — ABNORMAL LOW (ref 8.9–10.3)
Chloride: 96 mmol/L — ABNORMAL LOW (ref 98–111)
Creatinine, Ser: 4.06 mg/dL — ABNORMAL HIGH (ref 0.61–1.24)
GFR, Estimated: 17 mL/min — ABNORMAL LOW (ref >60)
Glucose, Bld: 206 mg/dL — ABNORMAL HIGH (ref 70–99)
Potassium: 3.1 mmol/L — ABNORMAL LOW (ref 3.5–5.1)
Sodium: 134 mmol/L — ABNORMAL LOW (ref 135–145)
Total Bilirubin: 4.2 mg/dL — ABNORMAL HIGH (ref 0.3–1.2)
Total Protein: 7.7 g/dL (ref 6.5–8.1)

## 2020-10-22 LAB — RENAL FUNCTION PANEL
Albumin: 1.6 g/dL — ABNORMAL LOW (ref 3.5–5.0)
Anion gap: 13 (ref 5–15)
BUN: 129 mg/dL — ABNORMAL HIGH (ref 6–20)
CO2: 27 mmol/L (ref 22–32)
Calcium: 8.5 mg/dL — ABNORMAL LOW (ref 8.9–10.3)
Chloride: 95 mmol/L — ABNORMAL LOW (ref 98–111)
Creatinine, Ser: 4.35 mg/dL — ABNORMAL HIGH (ref 0.61–1.24)
GFR, Estimated: 16 mL/min — ABNORMAL LOW (ref >60)
Glucose, Bld: 189 mg/dL — ABNORMAL HIGH (ref 70–99)
Phosphorus: 5 mg/dL — ABNORMAL HIGH (ref 2.5–4.6)
Potassium: 3 mmol/L — ABNORMAL LOW (ref 3.5–5.1)
Sodium: 135 mmol/L (ref 135–145)

## 2020-10-22 LAB — HEPATITIS B SURFACE ANTIGEN: Hepatitis B Surface Ag: NONREACTIVE

## 2020-10-22 LAB — CBC
HCT: 31 % — ABNORMAL LOW (ref 39.0–52.0)
Hemoglobin: 9.6 g/dL — ABNORMAL LOW (ref 13.0–17.0)
MCH: 27.4 pg (ref 26.0–34.0)
MCHC: 31 g/dL (ref 30.0–36.0)
MCV: 88.3 fL (ref 80.0–100.0)
Platelets: 361 10*3/uL (ref 150–400)
RBC: 3.51 MIL/uL — ABNORMAL LOW (ref 4.22–5.81)
RDW: 17.6 % — ABNORMAL HIGH (ref 11.5–15.5)
WBC: 17.6 10*3/uL — ABNORMAL HIGH (ref 4.0–10.5)
nRBC: 0.5 % — ABNORMAL HIGH (ref 0.0–0.2)

## 2020-10-22 LAB — GLUCOSE, CAPILLARY
Glucose-Capillary: 153 mg/dL — ABNORMAL HIGH (ref 70–99)
Glucose-Capillary: 169 mg/dL — ABNORMAL HIGH (ref 70–99)
Glucose-Capillary: 178 mg/dL — ABNORMAL HIGH (ref 70–99)
Glucose-Capillary: 187 mg/dL — ABNORMAL HIGH (ref 70–99)
Glucose-Capillary: 197 mg/dL — ABNORMAL HIGH (ref 70–99)
Glucose-Capillary: 216 mg/dL — ABNORMAL HIGH (ref 70–99)

## 2020-10-22 LAB — MAGNESIUM: Magnesium: 2.4 mg/dL (ref 1.7–2.4)

## 2020-10-22 LAB — PHOSPHORUS: Phosphorus: 5.2 mg/dL — ABNORMAL HIGH (ref 2.5–4.6)

## 2020-10-22 MED ORDER — HEPARIN SODIUM (PORCINE) 1000 UNIT/ML IJ SOLN
INTRAMUSCULAR | Status: AC
  Administered 2020-10-22: 1000 [IU]
  Filled 2020-10-22: qty 4

## 2020-10-22 MED ORDER — LIDOCAINE HCL (PF) 1 % IJ SOLN: 5.0000 mL | INTRAMUSCULAR | Status: DC | PRN

## 2020-10-22 MED ORDER — HEPARIN SODIUM (PORCINE) 1000 UNIT/ML DIALYSIS
1000.0000 [IU] | INTRAMUSCULAR | Status: DC | PRN
  Administered 2020-10-26: 1000 [IU] via INTRAVENOUS_CENTRAL
  Filled 2020-10-22: qty 1

## 2020-10-22 MED ORDER — ZINC CHLORIDE 1 MG/ML IV SOLN
INTRAVENOUS | Status: AC
  Filled 2020-10-22: qty 1246.4

## 2020-10-22 MED ORDER — LIDOCAINE-PRILOCAINE 2.5-2.5 % EX CREA
1.0000 "application " | TOPICAL_CREAM | CUTANEOUS | Status: DC | PRN
  Filled 2020-10-22: qty 5

## 2020-10-22 MED ORDER — SODIUM CHLORIDE 0.9 % IV SOLN: 100.0000 mL | INTRAVENOUS | Status: DC | PRN

## 2020-10-22 MED ORDER — PENTAFLUOROPROP-TETRAFLUOROETH EX AERO: 1.0000 "application " | INHALATION_SPRAY | CUTANEOUS | Status: DC | PRN

## 2020-10-22 MED ORDER — ALTEPLASE 2 MG IJ SOLR: 2.0000 mg | Freq: Once | INTRAMUSCULAR | Status: DC | PRN

## 2020-10-22 NOTE — Progress Notes (Signed)
5 Days Post-Op  Subjective: CT yesterday showed stable abdominal collections. Drainage attempted given fevers and rising WBC and concern for undrained pancreatic fluid in upper abdomen, but fluid was consistent with old hematoma. Minimal drainage overnight. Patient has been afebrile overnight, WBC 17 this morning. CMP pending. BUN up to 89 last night. Patient continues to make urine. Sat up in bed with PT yesterday.   Objective: Vital signs in last 24 hours: Temp:  [96.62 F (35.9 C)-101.3 F (38.5 C)] 98.7 F (37.1 C) (04/13 0400) Pulse Rate:  [36-102] 94 (04/13 0700) Resp:  [15-30] 24 (04/13 0700) BP: (81-119)/(40-80) 94/56 (04/13 0700) SpO2:  [89 %-100 %] 96 % (04/13 0700) Arterial Line BP: (107)/(63) 107/63 (04/12 0800) FiO2 (%):  [30 %-100 %] 40 % (04/13 0332) Last BM Date:  (pta)  Intake/Output from previous day: 04/12 0701 - 04/13 0700 In: 3122.2 [I.V.:2405.7; IV Piggyback:716.5] Out: 3080 [Urine:1000; Drains:2080] Intake/Output this shift: No intake/output data recorded.  PE: General: no acute distress Neuro: alert, follows commands HEENT: trach in place, site clean and dry. Scleral icterus. Resp: normal work of breathing, on vent Vent Mode: PRVC FiO2 (%):  [30 %-100 %] 40 % Set Rate:  [24 bmp] 24 bmp Vt Set:  [650 mL] 650 mL PEEP:  [5 cmH20] 5 cmH20 CV: RRR Abdomen: soft, nondistended, wound vac in place on midline incision. JPx2 with milky brown fluid. Biliary drain with bile. LUQ pancreatic stent with clear colorless fluid. G tube in place to gravity LUQ. LUQ perc drain with scant old blood.   Lab Results:  Recent Labs    10/21/20 0511 10/22/20 0607  WBC 19.2* 17.6*  HGB 10.3* 9.6*  HCT 31.2* 31.0*  PLT 411* 361   BMET Recent Labs    10/21/20 0511 10/21/20 1756  NA 131* 134*  K 3.2* 3.5  CL 98 97*  CO2 22 23  GLUCOSE 208* 194*  BUN 64* 89*  CREATININE 2.67* 3.46*  CALCIUM 8.3* 8.6*   PT/INR Recent Labs    10/21/20 1418  LABPROT  22.1*  INR 1.9*   CMP     Component Value Date/Time   NA 134 (L) 10/21/2020 1756   NA 142 07/21/2020 0921   K 3.5 10/21/2020 1756   CL 97 (L) 10/21/2020 1756   CO2 23 10/21/2020 1756   GLUCOSE 194 (H) 10/21/2020 1756   BUN 89 (H) 10/21/2020 1756   BUN 16 07/21/2020 0921   CREATININE 3.46 (H) 10/21/2020 1756   CREATININE 1.16 03/01/2016 1607   CALCIUM 8.6 (L) 10/21/2020 1756   PROT 7.8 10/21/2020 0511   PROT 6.7 07/21/2020 0921   ALBUMIN 1.6 (L) 10/21/2020 1756   ALBUMIN 4.1 07/21/2020 0921   AST 96 (H) 10/21/2020 0511   ALT 98 (H) 10/21/2020 0511   ALKPHOS 124 10/21/2020 0511   BILITOT 4.0 (H) 10/21/2020 0511   BILITOT 0.8 07/21/2020 0921   GFRNONAA 21 (L) 10/21/2020 1756   GFRNONAA >89 09/22/2015 1008   GFRAA 82 07/21/2020 0921   GFRAA >89 09/22/2015 1008   Lipase     Component Value Date/Time   LIPASE 23 08/22/2020 0318       Studies/Results: CT ABDOMEN PELVIS WO CONTRAST  Result Date: 10/21/2020 CLINICAL DATA:  History neuroendocrine tumor, post Whipple with intraoperative SMV laceration, ultimately with complete resection of the small bowel, now with postoperative abdominal pain and leukocytosis. EXAM: CT ABDOMEN AND PELVIS WITHOUT CONTRAST TECHNIQUE: Multidetector CT imaging of the abdomen and pelvis was  performed following the standard protocol without IV contrast. COMPARISON:  CT the chest, abdomen pelvis-10/15/2020 FINDINGS: Lower chest: Limited visualization of the lower thorax demonstrates improved aeration of the lung bases with persistent consolidative opacities within the right lower lobe with associated air bronchograms. Ill-defined somewhat nodular tree-in-bud opacities are seen within the left lower lobe though improved compared to the 10/15/2020 examination. No new focal airspace opacities. No pleural effusion. Normal heart size. No pericardial effusion. Pacer lead terminates within the right ventricular apex. No pericardial effusion. Hepatobiliary: Normal  hepatic contour. Redemonstrated approximately 7.6 x 6.0 x 2.6 cm fluid collection about the subcapsular aspect of the posterior segment of the right lobe of the liver (coronal image 58, series 6; axial image 31, series 3). Post cholecystectomy. Pancreas: Redemonstrated pancreatic drain within the remaining portions of the pancreas. A minimal amount of ill-defined fluid surrounds the pancreas though without definable/drainable fluid collection on this noncontrast examination. Spleen: Redemonstrated borderline splenomegaly with the spleen measuring 15 cm in length (image 22, series 3). Adrenals/Urinary Tract: Redemonstrated apparent asymmetric atrophy of the right kidney in comparison to the left. No renal stones. No urine obstruction. Normal noncontrast appearance the bilateral adrenal glands. Normal noncontrast appearance of the urinary bladder given degree of distention. Stomach/Bowel: Sequela Whipple and gastrojejunostomy with absence of the entirety the of small-bowel and ascending colon. Stable positioning balloon retention gastrostomy tube and multiple surgical drainage catheters. No new definable/drainable fluid collection within the abdomen or pelvis. Redemonstrated fluid collection with the pelvic cul-de-sac measuring approximately 4.9 x 4.9 cm (image 81, series 3) grossly unchanged compared to the 10/15/2020 examination, previously, 5.4 x 5.2 cm. Ill-defined fluid collection/hematoma within the left side of the root of the abdominal mesentery is unchanged measuring approximately 11.0 x 10.1 x 3.4 cm (coronal image 39, series 6; axial image 49, series 3), previously, 10.7 x 10.3 x 3.7 cm Separate ill-defined serpiginous fluid collection within the ventral aspect of the abdominal mesentery is also unchanged measuring approximately 4.0 x 3.2 x 2.2 cm (axial image 48, series 3; coronal image 25, series 6). Vascular/Lymphatic: Normal caliber the abdominal aorta. No bulky retroperitoneal, mesenteric, pelvic or  inguinal lymphadenopathy on this noncontrast examination. Reproductive: Normal appearance the prostate gland. Other: Open wound within the ventral aspect of the right mid abdomen. Nodular areas of subcutaneous stranding within the left ventral abdominal wall likely at the location of subcutaneous medication administration. Mild diffuse body wall anasarca. Musculoskeletal: No acute or aggressive osseous abnormalities. IMPRESSION: 1. Stable sequela of Whipple, total small bowel resection and subtotal colectomy with stable positioning of multiple surgical drains, pancreatic duct catheter and balloon retention gastrostomy tube. 2. No new definable/drainable fluid collections within the abdomen or pelvis. 3. Unchanged ill-defined fluid collections within the left side of the root of the abdominal mesentery, the ventral aspect of the abdominal mesentery, the subcapsular aspect of the right lobe of the liver and the pelvic cul-de-sac all of which are incompletely evaluated on this noncontrast examination though may represent areas of evolving hematomas giving imaging stability for the past week. 4. Redemonstrated ill-defined stranding surrounding the remaining portions of the pancreas without definable/drainable fluid collection on this noncontrast examination. 5. Improved aeration of the lung bases with persistent consolidative opacities within the right lower lobe, nonspecific though could be seen in the setting of aspiration. Electronically Signed   By: [name] M.D.   On: 10/21/2020 10:28   DG CHEST PORT 1 VIEW  Result Date: 10/20/2020 CLINICAL DATA:  Respiratory failure. EXAM: PORTABLE  CHEST 1 VIEW COMPARISON:  October 18, 2020. FINDINGS: The heart size and mediastinal contours are within normal limits. No pneumothorax or pleural effusion is noted. Tracheostomy tube is in good position. Left-sided pacemaker is unchanged. Right internal jugular catheter is unchanged. Mild right basilar subsegmental atelectasis or  infiltrate is noted. The visualized skeletal structures are unremarkable. IMPRESSION: Mild right basilar subsegmental atelectasis or infiltrate. Stable support apparatus. Electronically Signed   By: Marijo Conception M.D.   On: 10/20/2020 08:16   CT IMAGE GUIDED DRAINAGE BY PERCUTANEOUS CATHETER  Result Date: 10/21/2020 INDICATION: History of pancreatic neuroendocrine tumor, post Whipple with intraoperative sn the laceration ultimately requiring complete resection of the small bowel found to have a persistent fluid collection within the root of the abdominal mesentery. Given persistent fevers, request made for CT-guided aspiration and/or drainage catheter placement for infection source control purposes. EXAM: CT IMAGE GUIDED DRAINAGE BY PERCUTANEOUS CATHETER COMPARISON:  CT abdomen pelvis-earlier same day; 10/15/2020 MEDICATIONS: The patient is currently admitted to the hospital and receiving intravenous antibiotics. The antibiotics were administered within an appropriate time frame prior to the initiation of the procedure. ANESTHESIA/SEDATION: Moderate (conscious) sedation was employed during this procedure. A total of Versed 0.5 mg and Fentanyl 25 mcg was administered intravenously. Moderate Sedation Time: 15 minutes. The patient's level of consciousness and vital signs were monitored continuously by radiology nursing throughout the procedure under my direct supervision. CONTRAST:  None COMPLICATIONS: None immediate. PROCEDURE: Informed written consent was obtained from the patient's family after a discussion of the risks, benefits and alternatives to treatment. The patient was placed supine on the CT gantry and a pre procedural CT was performed re-demonstrating the known abscess/fluid collection within the root of the abdominal mesentery with dominant component measuring approximately 9.7 x 3.5 cm (image 15, series 3). The procedure was planned. A timeout was performed prior to the initiation of the procedure.  The skin overlying the left lateral abdomen was prepped and draped in the usual sterile fashion. The overlying soft tissues were anesthetized with 1% lidocaine with epinephrine. Appropriate trajectory was planned with the use of a 22 gauge spinal needle. An 18 gauge trocar needle was advanced into the abscess/fluid collection and a short Amplatz super stiff wire was coiled within the collection. Appropriate positioning was confirmed with a limited CT scan. The tract was serially dilated allowing placement of a 10 [place] all-purpose drainage catheter. Appropriate positioning was confirmed with a limited postprocedural CT scan. Despite appropriate drainage catheter positioning, only approximately 2 cc of thick bloody fluid was able to be aspirated. As such, several rounds of attempted lavage aspiration for attempted however failed to yield any additional fluid. The tube was connected to a JP bulb and sutured in place. A dressing was placed. The patient tolerated the procedure well without immediate post procedural complication. IMPRESSION: Successful CT guided placement of a 10 French all purpose drain catheter into the postoperative hematoma within the root of the abdominal mesentery with aspiration of only 2 mL of thick bloody fluid. Samples were sent to the laboratory as requested by the ordering clinical team. PLAN: As this fluid collection appears to represent a hematoma, if the drainage catheter fails to provide any significant yield within the next 1-2 days would have low hesitation to remove the drainage catheter at the patient's bedside. Above was discussed with providing surgeon, Dr. Zenia Resides, the time of procedure completion. Electronically Signed   By: [name] M.D.   On: 10/21/2020 16:58    Assessment/Plan  50 yo male 1 month s/p Whipple, right colectomy and SMV repair for T3N0 neuroendocrine tumor. C/b postop hemorrhagic shock with DIC and subsequent diffuse small bowel necrosis, now s/p small bowel  resection with external drainage of bile duct, pancreatic duct and stomach. - Continue zosyn, antifungal coverage also added - Blood cultures negative to date - Acute renal failure: intermittent HD, nephrology following - Continue TPN. Will need tunneled line when fevers and leukocytosis improve. - G tube to gravity - Vent wean as tolerated, continue trach collar trials - Vac change MWF - VTE: SQH - Dispo: ICU    LOS: 30 days    Michaelle Birks, MD [facility] Surgery General, Hepatobiliary and Pancreatic Surgery 10/22/20 7:31 AM

## 2020-10-22 NOTE — Progress Notes (Signed)
[name], MRN:  [id number], DOB:  01/18/1971, LOS: 95 ADMISSION DATE:  09/24/2020, CONSULTATION DATE:  10/22/20 REFERRING MD:  Anesthesia, CHIEF COMPLAINT:  Whipple, post-op shock   Brief History:  50 y.o. M with PMH of neuroendocrine tumor and underwent Whipple procedure 3/14 with intra-operative SMV laceration and repair by vascular with bovine pericardial patch.  Pt had worsening shock overnight and required three pressors despite massive transfusion protocol (received 150 blood products).  He was taken back to the OR for washout on 3/15 and returned to the ICU intubated.  Taken back 3/17 again found to have small bowel necrosis s/p resection.  Plans again to return 3/19 to OR.   Totality of small bowel has been resected. The case was discussed with [facility] who may consider eval for sm intestinal transplant pending clinical course.  Continuing aggressive supportive care.   Past Medical History:   has a past medical history of AICD (automatic cardioverter/defibrillator) present, Dyslipidemia, Heart failure with mildly reduced EF, History of kidney stones, HTN (hypertension), ventricular fibrillation, and Nonischemic cardiomyopathy.  Significant Hospital Events:  3/14: Admit to Surgery, to OR for whipple, SMV injury and repair, massive transfusion protocol overnight  3/15: Back to OR for washout, x2. IR for arteriogram. No major bleed source identified in OR, or arterial bleed w IR. Robust product resuscitation >75 products (+ TXA + novoseven) 3/16: off pressors, add'l 39 products overnight. Slowing resuscitation this morning with hemodynamic improvements and slowing drain output; OR x 2 - washout, biliary t tube, wound vac -- washout, wound vac, IR for arteriogram -- no arterial bleed for embolization 3/17:  Aggressive balanced transfusion continue overnight with marked hemodynamic improvement since yesterday evening. Off pressors x several hours, Wound vac output significantly slowing  (from 541ml q15-86min to 544ml q1hr+) and output is much thinner, Thin bloody secretions from mouth approx 293ml, Decreased RR from 22 to 18, Unable to tolerate NE- severe bradycardia 3/19 taken back to OR. Small bowel noted to be almost completely necrosed. Patient was closed with plans for goals of car discussion.  3/20 GOC family endorses full scope of treatment. Primary team discussed with [facility] the possibility of transfer for small bowel transplant. [facility] felt as though transfer would not change outcome. 3/21 Ongoing discussion with family and [facility]. Patient may be a candidate for transplant if he can stabilize post a small bowel resection. He was taken back to the OR and small bowel was resected.  3/23 weaning pressors. Versed off. Remains encephalopathic 3/24 off pressors. Low dose dilaudid gtt -- only weakly grimaces to pain. Changing sedation to precedex + PRN fentanyl. Long discussion with 2 brothers regarding clinical case -- tried to clarify that while the term "stable" has been used, in this instance is meaning that he has not declined from previous shift but is in fact still critically ill with multisystem organ dysfunction.  3/25 Cr and BUN increased. Off sedation  3/26 Pressors off, CT head benign, BUN/Cr continue to creep up 3/27 Tachycardia/HTN overnight improved with fentanyl gtt 2/28 PCCM sign off. Trauma to take over vent/CCM needs.  3/30 extubated 4/2 desaturated overnight to the 30s improved with BVM and NTS.  4/5 poor airway protection. PCCM consulted for re-intubation.  4/6 CT a/p Large RLL opacity, smaller LLL opacity. Post op changes. 5.4cm fluid collection in posterior pelvis, concerning for abscess. Started on Zosyn  4/7 got transfused overnight and then diuresed. Worse renal function 4/9 transfused again. HD line inserted and started on  CRRT 4/10 started on trach collar trials. 4/12 off CRRT and transitioned to IHD.  Consults:  PCCM VVS  IR  Palliative  Procedures:   PICC 3/25 > JP x 2  Biliary drain Pancreatic stent drain ETT 2/24>>3/13; 3/15 >> 3/29;  4/5 >  Significant Diagnostic Tests:  See Radiology tab   Most recent significant exam was CT a/p 4/6: which revealed R>L pulmonary opacities, and pelvic fluid collection concerning for abscess as well as detailing the numerous post-op changes.   Micro Data:  See micro tab 4/5 trach asp >> Gram neg coccobacilli   Antimicrobials:  Zosyn 3/21 > 3/29; 4/5 >> Eraxis 3/23> 3/29  Interim History / Subjective:   Attempted drainage of peripancreatic fluid collection for new fever and worsening leukocytosis. Unable to aspirate as collection appears to be clot. Off CRRT - trial of IHD on Friday. Tolerating trach collar trials.   Objective   Blood pressure (!) 94/56, pulse 93, temperature (!) 97.3 F (36.3 C), temperature source Axillary, resp. rate (!) 24, height 6\' 2"  (1.88 m), weight 103.2 kg, SpO2 99 %.    Vent Mode: PRVC FiO2 (%):  [35 %-100 %] 35 % Set Rate:  [24 bmp] 24 bmp Vt Set:  [650 mL] 650 mL PEEP:  [5 cmH20] 5 cmH20   Intake/Output Summary (Last 24 hours) at 10/22/2020 1048 Last data filed at 10/22/2020 7342 Gross per 24 hour  Intake 2514.81 ml  Output 2505 ml  Net 9.81 ml   Filed Weights   10/01/20 1503 10/15/20 1503 10/19/20 0400  Weight: 124.9 kg 94.4 kg 103.2 kg   Examination:  General:  Obese man in no distress.  HEENT: [place]. Tracheostomy in place.  Mild scleral icterus, mucus membranes are moist. Neuro: Awake and follows commands. Generalized weakness but moves all limbs, 3/5 strength. Moves himself in bed.  CV: rr with some PVCs. Cap refill < 3 seconds. S1s2, PULM:  Symmetrical chest expansion, chest clear bilaterally.   GI: Round, mild tenderness localized to surgical site. Multiple drains-- JPs with turbid fluid. VAC dressing in place. Extremities:.  No edema, significant muscle wasting. Skin: c/d/w no rash   Resolved Hospital Problem list   Hemorrhagic  shock hypophosphatemia Septic shock  Assessment & Plan:   Critically ill due to acute hypoxemic respiratory failure requiring mechanical ventilation. due to generalized weakness and possible aspiration pneumonia -Continue daily SBT - trach collar today for 3-4 intervals bid with rest on full support in between. Complete trials during the day to allow uninterrupted sleep at nighttime.  - Continue 4h trach collar bid today. If does well today and Thu, consider going full day Fri. Continue to rest 8pm-8am. -Start working with Passy-Muir valve.  - PT - up to chair today.   Pancreatic neuroendocrine tumor s/p whipple 3/21 b/c hemorrhagic shock, c/b small bowel necrosis now s/p total small bowel resection and externa; drainage for bile duct pancreatic duct and stomach.  - Continue TPN - Will need tunneled access for TPN to decrease infection risk. Will need to hold off for now given fever and leukocytosis.  - Family's continued hope is for transfer to [facility] for small bowel transplant. Suspect pt has quite a ways to go before stable enough to be considered, and am unsure if underlying conditions like NICM would preclude candidacy.   Fever and leukocytosis - Blood cultures and CT abdomen - Continue current Zosyn and Eraxis for further 10 days.    AKI, worse since reintubation - possible hypotensive hit -Transition  to IHD Friday  .  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   NICM s/p ICD - EF has improved based on recent echocardiogram.  -Well compensated at this time.  - Lack of digestive tract precludes usual GDHFT.  [place] Suspect poor prognosis.  The family hopes for a small intestine transplant --[facility] has suggested he might be candidate but only if we can get him to the point being rehabilitation ready.     Daily Goals Checklist  Pain/Anxiety/Delirium protocol (if indicated):  PRN only for RASS goal 0 to -1 VAP protocol (if indicated): bundle in place.  DVT prophylaxis: SCD Nutrition Status: TPN - needs tunneled line. Plan for early next week if fever and leukocytosis improve.  GI prophylaxis: Pantoprazole Urinary catheter: external Central lines: PICC 3/25 Glucose control: -SSI + TPN Mobility/therapy needs: Mobilize to chair if possible Restraints: Restraint type:NA Reason for restraints:NA Daily labs: CBC, CMP daily Code Status: Partial - -no CV/DF (has an ICD however) Family Communication: per primary Disposition: ICU.  Goals of Care:  Last date of multidisciplinary goals of care discussion: 35/5 Family and staff present: Dr. Zenia Resides Summary of discussion: Family wishes for aggressive care.   Code Status:  Partial - no CV/DF by [place] (has an ICD which is not to be turned off)  CRITICAL CARE Performed by: Kipp Brood   Total critical care time: 35 minutes  Critical care time was exclusive of separately billable procedures and treating other patients. Critical care was necessary to treat or prevent imminent or life-threatening deterioration.  Critical care was time spent personally by me on the following activities: development of treatment plan with patient and/or surrogate as well as nursing, discussions with consultants, evaluation of patient's response to treatment, examination of patient, obtaining history from patient or surrogate, ordering and performing treatments and interventions, ordering and review of laboratory studies, ordering and review of radiographic studies, pulse oximetry and re-evaluation of patient's condition.  Kipp Brood, MD [facility] ICU Physician [place]  Pager: [phone] Or Epic Secure Chat After hours: [phone].  10/22/2020, 10:48 AM

## 2020-10-22 NOTE — Progress Notes (Signed)
PHARMACY - TOTAL PARENTERAL NUTRITION CONSULT NOTE  Indication:  Short bowel syndrome  Patient Measurements: Height: 6\' 2"  (188 cm) Weight: 124.9 kg (275 lb 5.7 oz) IBW/kg (Calculated) : 82.2 TPN AdjBW (KG): 95.3 Body mass index is 35.35 kg/m.  Assessment:  88 YOM with history of mass adjacent to head of the pancreas in 2021. 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  Developed cholecystitis in Feb 2022 and had percutaneous cholecystectomy tube placement.  Presented this admission for neuroendocrine tumor resection with Whipple procedure, also had right total colectomy, colostomy and cholecystectomy on 09/10/2020. SMV was lacerated during procedure and repaired by bovine pericardial patch. Developed hemorrhagic shock with bile leak post-op and underwent placement of biliary T tube with abdominal washout on 3/15 PM. Abdomen remains open. Pharmacy consulted to manage TPN.  Glucose / Insulin: no hx DM, A1c 5.2%. CBGs 161-216.  Utilized 39 units SSI in the past 24h hours, 50 units in TPN, Novolog 2 units Q4H (hold for CBG < 150).   Electrolytes: Na up to 134, K down to 3.1, Phos up to 5.2, CO2 normalized (max acetate in TPN), CoCa 9.7 (iCa 1.16 low normal on 4/7), others WNL Renal: CRRT started 4/9 > 4/11, tentative plan for iHD with first if renal fx does not improve, BUN up to 112  Hepatic: LFTs mildly elevated, tbili 4.2 (scleral yellowing still noted per RN 4/3) Albumin 1.6, prealbumin 22.8 (4/11) TG peaked at 740, now at 299 on 4/11 - Intralipids 100g/wk and monitor TG trends Intake / Output; MIVF: UOP 0.4 ml/kg/hr, drain (5 total): 2067mL, 75 cc from gastrostomy, net -9L GI Imaging:  4/6 CT - pelvic fluid collection concerning for abscess GI Surgeries / Procedures:  3/14 Whipple procedure with R hemicolectomy and end ileostomy, cholecystectomy 3/15 washout, VAC placement 3/17 washout, VAC replacement.  Necrosis of entire small bowel except for the PB limb and  proximal 40-50cm of jejunum 3/19 re-opening laparotomy, abd closure.  Necrotic SB. 3/21 SB resection, take down of choledochojejunal anastomosis and pancreaticojejunal anastomosis, take down of duodenojejunal anastomosis, placement of externalized biliary drain / Stamm gastrostomy tube, IA washout and placement of intraperitoneal drains 3/30 extubation 4/8 trach  Central access: R IJ CVC placed 3/15; changed to PICC placed 10/03/20 TPN start date: 09/15/2020  Nutritional Goals (RD rec on 3/30): 2800-3000 kCal, 185-210g AA, fluid >2L/day  Current Nutrition:  TPN  Plan:  - Continue concentrated TPN at 95 ml/hr - Intralipid 20% 248mL Mon/Thurs for now to prevent EFAD - TPN with Intralipid 2x/wk provides a daily average of 187g AA and 2869 kCal, meeting 100% of patient needs.  - Electrolytes in TPN: Continue Na 167mEq/L, K 10 meQ/L, Remove Phos, Ca 54mEq/L, Continue Mag 13mEq/L, max acetate - Daily multivitamin in TPN.  Remove standard trace elements and add back zinc 5mg , and selenium 56mcg.  Remove chromium while on RRT. - Continue resistant SSI Q4H, 50 units regular insulin in TPN, Novolog 2 units SQ Q6H. - Standard TPN labs and TG on Mon/Thurs -Planning trial of iHD today with 4K bath  Albertina Parr, PharmD., BCPS, BCCCP Clinical Pharmacist Please refer to [facility] for unit-specific pharmacist

## 2020-10-22 NOTE — Progress Notes (Signed)
Referring Physician(s): [name] ([place])  Supervising Physician: Corrie Mckusick  Patient Status:  [facility] - In-pt  Chief Complaint: Left lateral drain F/U  Subjective:  Neuroendocrine tumor of pancreass/p Whipple procedure with hemicolectomy in OR 09/18/2020 by Dr. Zenia Resides; complicated by hemorrhagic shock with DIC and subsequent diffuse small bowel necrosis s/p small bowel resection 0/97/3532; further complication by intra-abdominal fluid collection s/p left lateral abdominal drain placement in IR 10/21/2020. Patient laying in bed resting comfortably, tracheostomy in place. He opens eyes to voice and nods to yes/no questions appropriately. Left lateral abdominal drain site c/d/i.   Allergies: Ace inhibitors  Medications: Prior to Admission medications   Medication Sig Start Date End Date Taking? Authorizing Provider  acetaminophen (TYLENOL) 500 MG tablet Take 2 tablets (1,000 mg total) by mouth every 6 (six) hours. Patient taking differently: Take 1,000 mg by mouth every 6 (six) hours as needed for mild pain. 08/22/20  Yes [name], MD  allopurinol (ZYLOPRIM) 100 MG tablet Take one tablet by mouth daily for 1 week and then 2 tablets by mouth daily Patient taking differently: Take 200 mg by mouth daily. 07/03/20  Yes Swords, Darrick Penna, MD  amiodarone (PACERONE) 200 MG tablet Take 1 tablet (200 mg total) by mouth daily. Patient taking differently: Take 200 mg by mouth daily with supper. 07/30/20  Yes Evans Lance, MD  aspirin 81 MG tablet Take 1 tablet (81 mg total) by mouth 2 (two) times daily. Patient taking differently: Take 81 mg by mouth daily. 09/22/15  Yes Funches, Josalyn, MD  bisoprolol (ZEBETA) 10 MG tablet Take 1 tablet by mouth once daily Patient taking differently: Take 10 mg by mouth daily. 08/25/20  Yes Evans Lance, MD  budesonide-formoterol [facility]) 80-4.5 MCG/ACT inhaler Inhale 2 puffs into the lungs 2 (two) times daily. Must keep upcoming office visit  for refills Patient taking differently: Inhale 2 puffs into the lungs 2 (two) times daily as needed (for flares). 11/09/19  Yes Fulp, Cammie, MD  sacubitril-valsartan (ENTRESTO) 24-26 MG Take 1 tablet by mouth 2 (two) times daily. 06/25/20  Yes Evans Lance, MD  amiodarone (PACERONE) 200 MG tablet TAKE 1 TABLET (200 MG TOTAL) BY MOUTH DAILY. 07/30/20 07/30/21  Evans Lance, MD  amiodarone (PACERONE) 200 MG tablet TAKE 1 TABLET (200 MG TOTAL) BY MOUTH DAILY. 11/15/19 11/14/20  Evans Lance, MD  bisoprolol (ZEBETA) 10 MG tablet TAKE 1 TABLET (10 MG TOTAL) BY MOUTH DAILY. 11/15/19 11/14/20  Evans Lance, MD  oxyCODONE (OXY IR/ROXICODONE) 5 MG immediate release tablet Take 1-2 tablets (5-10 mg total) by mouth every 6 (six) hours as needed for severe pain. Patient not taking: Reported on 09/10/2020 08/22/20   [name], MD     Vital Signs: BP (!) 94/56   Pulse 93   Temp (!) 97.3 F (36.3 C) (Axillary)   Resp (!) 24   Ht 6' 2"  (1.88 m)   Wt 227 lb 8.2 oz (103.2 kg)   SpO2 99%   BMI 29.21 kg/m   Physical Exam Vitals and nursing note reviewed.  Constitutional:      General: He is not in acute distress.    Comments: Tracheostomy.  Pulmonary:     Effort: Pulmonary effort is normal. No respiratory distress.     Comments: Tracheostomy. Abdominal:     Comments: (+) midline wound, woundvac in place. (+) three right surgical drains, one left surgical drain, gastrostomy tube to gravity.  Left lateral abdominal  drain site without erythema, drainage, or active bleeding; minimal output of thick bloody fluid in suction bulb- drain flushes easily but resistance is met with aspiration.  Skin:    General: Skin is warm and dry.  Neurological:     Mental Status: He is alert.     Comments: Tracheostomy. Nods appropriately to yes/no questions.     Imaging: CT ABDOMEN PELVIS WO CONTRAST  Result Date: 10/21/2020 CLINICAL DATA:  History neuroendocrine tumor, post Whipple with intraoperative SMV  laceration, ultimately with complete resection of the small bowel, now with postoperative abdominal pain and leukocytosis. EXAM: CT ABDOMEN AND PELVIS WITHOUT CONTRAST TECHNIQUE: Multidetector CT imaging of the abdomen and pelvis was performed following the standard protocol without IV contrast. COMPARISON:  CT the chest, abdomen pelvis-10/15/2020 FINDINGS: Lower chest: Limited visualization of the lower thorax demonstrates improved aeration of the lung bases with persistent consolidative opacities within the right lower lobe with associated air bronchograms. Ill-defined somewhat nodular tree-in-bud opacities are seen within the left lower lobe though improved compared to the 10/15/2020 examination. No new focal airspace opacities. No pleural effusion. Normal heart size. No pericardial effusion. Pacer lead terminates within the right ventricular apex. No pericardial effusion. Hepatobiliary: Normal hepatic contour. Redemonstrated approximately 7.6 x 6.0 x 2.6 cm fluid collection about the subcapsular aspect of the posterior segment of the right lobe of the liver (coronal image 58, series 6; axial image 31, series 3). Post cholecystectomy. Pancreas: Redemonstrated pancreatic drain within the remaining portions of the pancreas. A minimal amount of ill-defined fluid surrounds the pancreas though without definable/drainable fluid collection on this noncontrast examination. Spleen: Redemonstrated borderline splenomegaly with the spleen measuring 15 cm in length (image 22, series 3). Adrenals/Urinary Tract: Redemonstrated apparent asymmetric atrophy of the right kidney in comparison to the left. No renal stones. No urine obstruction. Normal noncontrast appearance the bilateral adrenal glands. Normal noncontrast appearance of the urinary bladder given degree of distention. Stomach/Bowel: Sequela Whipple and gastrojejunostomy with absence of the entirety the of small-bowel and ascending colon. Stable positioning balloon  retention gastrostomy tube and multiple surgical drainage catheters. No new definable/drainable fluid collection within the abdomen or pelvis. Redemonstrated fluid collection with the pelvic cul-de-sac measuring approximately 4.9 x 4.9 cm (image 81, series 3) grossly unchanged compared to the 10/15/2020 examination, previously, 5.4 x 5.2 cm. Ill-defined fluid collection/hematoma within the left side of the root of the abdominal mesentery is unchanged measuring approximately 11.0 x 10.1 x 3.4 cm (coronal image 39, series 6; axial image 49, series 3), previously, 10.7 x 10.3 x 3.7 cm Separate ill-defined serpiginous fluid collection within the ventral aspect of the abdominal mesentery is also unchanged measuring approximately 4.0 x 3.2 x 2.2 cm (axial image 48, series 3; coronal image 25, series 6). Vascular/Lymphatic: Normal caliber the abdominal aorta. No bulky retroperitoneal, mesenteric, pelvic or inguinal lymphadenopathy on this noncontrast examination. Reproductive: Normal appearance the prostate gland. Other: Open wound within the ventral aspect of the right mid abdomen. Nodular areas of subcutaneous stranding within the left ventral abdominal wall likely at the location of subcutaneous medication administration. Mild diffuse body wall anasarca. Musculoskeletal: No acute or aggressive osseous abnormalities. IMPRESSION: 1. Stable sequela of Whipple, total small bowel resection and subtotal colectomy with stable positioning of multiple surgical drains, pancreatic duct catheter and balloon retention gastrostomy tube. 2. No new definable/drainable fluid collections within the abdomen or pelvis. 3. Unchanged ill-defined fluid collections within the left side of the root of the abdominal mesentery, the ventral aspect of the  abdominal mesentery, the subcapsular aspect of the right lobe of the liver and the pelvic cul-de-sac all of which are incompletely evaluated on this noncontrast examination though may represent  areas of evolving hematomas giving imaging stability for the past week. 4. Redemonstrated ill-defined stranding surrounding the remaining portions of the pancreas without definable/drainable fluid collection on this noncontrast examination. 5. Improved aeration of the lung bases with persistent consolidative opacities within the right lower lobe, nonspecific though could be seen in the setting of aspiration. Electronically Signed   By: [name] M.D.   On: 10/21/2020 10:28   DG Chest 1 View  Result Date: 10/18/2020 CLINICAL DATA:  Chronic ventilator dependent respiratory failure. Bedside central venous catheter placement. EXAM: PORTABLE CHEST 1 VIEW COMPARISON:  10/24/2020 and earlier. FINDINGS: The new RIGHT jugular central venous catheter tip projects over the mid SVC. No evidence of pneumothorax or mediastinal hematoma. RIGHT arm PICC tip projects over the cavoatrial junction, unchanged. Tracheostomy tube tip in satisfactory position above the carina. LEFT subclavian pacing defibrillator unchanged and appears intact. Cardiac silhouette upper normal in size to slightly enlarged for AP portable technique. Improved aeration in the lung bases since yesterday, with mild atelectasis persisting. No new pulmonary parenchymal abnormalities. IMPRESSION: 1. New RIGHT jugular central venous catheter tip projects over the mid SVC. No acute complicating features. 2. Remaining support apparatus satisfactory. 3. Improved aeration in the lung bases with mild atelectasis persisting. No new abnormalities. Electronically Signed   By: Evangeline Dakin M.D.   On: 10/18/2020 11:41   DG CHEST PORT 1 VIEW  Result Date: 10/20/2020 CLINICAL DATA:  Respiratory failure. EXAM: PORTABLE CHEST 1 VIEW COMPARISON:  October 18, 2020. FINDINGS: The heart size and mediastinal contours are within normal limits. No pneumothorax or pleural effusion is noted. Tracheostomy tube is in good position. Left-sided pacemaker is unchanged. Right internal  jugular catheter is unchanged. Mild right basilar subsegmental atelectasis or infiltrate is noted. The visualized skeletal structures are unremarkable. IMPRESSION: Mild right basilar subsegmental atelectasis or infiltrate. Stable support apparatus. Electronically Signed   By: Marijo Conception M.D.   On: 10/20/2020 08:16   CT IMAGE GUIDED DRAINAGE BY PERCUTANEOUS CATHETER  Result Date: 10/21/2020 INDICATION: History of pancreatic neuroendocrine tumor, post Whipple with intraoperative sn the laceration ultimately requiring complete resection of the small bowel found to have a persistent fluid collection within the root of the abdominal mesentery. Given persistent fevers, request made for CT-guided aspiration and/or drainage catheter placement for infection source control purposes. EXAM: CT IMAGE GUIDED DRAINAGE BY PERCUTANEOUS CATHETER COMPARISON:  CT abdomen pelvis-earlier same day; 10/15/2020 MEDICATIONS: The patient is currently admitted to the hospital and receiving intravenous antibiotics. The antibiotics were administered within an appropriate time frame prior to the initiation of the procedure. ANESTHESIA/SEDATION: Moderate (conscious) sedation was employed during this procedure. A total of Versed 0.5 mg and Fentanyl 25 mcg was administered intravenously. Moderate Sedation Time: 15 minutes. The patient's level of consciousness and vital signs were monitored continuously by radiology nursing throughout the procedure under my direct supervision. CONTRAST:  None COMPLICATIONS: None immediate. PROCEDURE: Informed written consent was obtained from the patient's family after a discussion of the risks, benefits and alternatives to treatment. The patient was placed supine on the CT gantry and a pre procedural CT was performed re-demonstrating the known abscess/fluid collection within the root of the abdominal mesentery with dominant component measuring approximately 9.7 x 3.5 cm (image 15, series 3). The procedure  was planned. A timeout was performed prior  to the initiation of the procedure. The skin overlying the left lateral abdomen was prepped and draped in the usual sterile fashion. The overlying soft tissues were anesthetized with 1% lidocaine with epinephrine. Appropriate trajectory was planned with the use of a 22 gauge spinal needle. An 18 gauge trocar needle was advanced into the abscess/fluid collection and a short Amplatz super stiff wire was coiled within the collection. Appropriate positioning was confirmed with a limited CT scan. The tract was serially dilated allowing placement of a 10 [place] all-purpose drainage catheter. Appropriate positioning was confirmed with a limited postprocedural CT scan. Despite appropriate drainage catheter positioning, only approximately 2 cc of thick bloody fluid was able to be aspirated. As such, several rounds of attempted lavage aspiration for attempted however failed to yield any additional fluid. The tube was connected to a JP bulb and sutured in place. A dressing was placed. The patient tolerated the procedure well without immediate post procedural complication. IMPRESSION: Successful CT guided placement of a 10 French all purpose drain catheter into the postoperative hematoma within the root of the abdominal mesentery with aspiration of only 2 mL of thick bloody fluid. Samples were sent to the laboratory as requested by the ordering clinical team. PLAN: As this fluid collection appears to represent a hematoma, if the drainage catheter fails to provide any significant yield within the next 1-2 days would have low hesitation to remove the drainage catheter at the patient's bedside. Above was discussed with providing surgeon, Dr. Zenia Resides, the time of procedure completion. Electronically Signed   By: [name] M.D.   On: 10/21/2020 16:58    Labs:  CBC: Recent Labs    10/19/20 0522 10/20/20 0515 10/21/20 0511 10/22/20 0607  WBC 10.3 13.1* 19.2* 17.6*  HGB 8.3* 9.7*  10.3* 9.6*  HCT 25.6* 30.0* 31.2* 31.0*  PLT 270 331 411* 361    COAGS: Recent Labs    09/19/2020 1228 10/06/2020 1947 09/24/20 0400 09/24/20 1650 09/12/2020 1909 09/14/2020 1624 10/21/20 1418  INR 1.5* 0.9   < > 1.4* 1.4* 1.4* 1.9*  APTT 37* 35  --  32 39*  --   --    < > = values in this interval not displayed.    BMP: Recent Labs    12/03/19 1703 05/24/20 1956 07/03/20 1643 07/21/20 0921 08/21/20 1037 10/20/20 1643 10/21/20 0511 10/21/20 1756 10/22/20 0607  NA 144   < > 142 142   < > 133* 131* 134* 134*  K 4.6   < > 3.7 4.2   < > 3.4* 3.2* 3.5 3.1*  CL 109*   < > 103 104   < > 100 98 97* 96*  CO2 21   < > 25 27   < > 23 22 23 25   GLUCOSE 103*   < > 105* 83   < > 195* 208* 194* 206*  BUN 15   < > 18 16   < > 40* 64* 89* 112*  CALCIUM 9.5   < > 9.1 9.5   < > 8.4* 8.3* 8.6* 8.6*  CREATININE 1.27   < > 0.96 1.19   < > 1.58* 2.67* 3.46* 4.06*  GFRNONAA 66   < > 92 71   < > 53* 28* 21* 17*  GFRAA 77  --  107 82  --   --   --   --   --    < > = values in this interval not displayed.    LIVER FUNCTION  TESTS: Recent Labs    10/19/20 0522 10/19/20 1545 10/20/20 0515 10/20/20 1643 10/21/20 0511 10/21/20 1756 10/22/20 0607  BILITOT 3.8*  --  3.9*  --  4.0*  --  4.2*  AST 95*  --  98*  --  96*  --  86*  ALT 84*  --  94*  --  98*  --  96*  ALKPHOS 87  --  115  --  124  --  105  PROT 7.2  --  7.8  --  7.8  --  7.7  ALBUMIN 1.3*   < > 1.5* 1.6* 1.7* 1.6* 1.6*   < > = values in this interval not displayed.    Assessment and Plan:  Neuroendocrine tumor of pancreass/p Whipple procedure with hemicolectomy in OR 10/09/2020 by Dr. Zenia Resides; complicated by hemorrhagic shock with DIC and subsequent diffuse small bowel necrosis s/p small bowel resection 1/58/6825; further complication by intra-abdominal fluid collection s/p left lateral abdominal drain placement in IR 10/21/2020. Left lateral abdominal drain stable with minimal output of thick bloody fluid in suction bulb- drain  flushes easily but resistance is met with aspiration. Per Dr. Pascal Lux procedure note "As this fluid collection appears to represent a hematoma, if the drainage catheter fails to provide any significant yield within the next 1-2 days would have low hesitation to remove the drainage catheter at the patient's bedside."- will re-address output tomorrow and possibly pull at bedside at that time. For now continue current drain management- continue with Qshift flushes/monitor of output. Further plans per CCS/CCM- appreciate and agree with management. IR to follow.   Electronically Signed: Earley Abide, PA-C 10/22/2020, 9:41 AM   I spent a total of 15 Minutes at the the patient's bedside AND on the patient's hospital floor or unit, greater than 50% of which was counseling/coordinating care for intra-abdominal fluid collection s/p left lateral abdominal drain placement.

## 2020-10-22 NOTE — Progress Notes (Signed)
[facility] Progress Note     Assessment/ Plan:   1.Acute renal failure - he has reasons to be in ATN With hypotensive episodes requiring Levophed in the setting of a very complicated surgical history with multiple visits to the OR + DIC + diffuse small bowel necrosis. Creatinine fluctuating represents compromised renal function with unfortunately repeated insults. No evidence of obstruction. continued purulent drainage from JP drain in abdomen.   - Pt and family desire all aggressive interventions, confirmed 10/16/20 -startedCRRT 10/18/20-10/20/20 1800d/w PCCM/ CCS, all 4K bath, no heparin - Avoid nephrotoxins and dose medications for a GFR < 15 ml/min.  - Will continue monitoring for any signs of renal recovery;  UOP increased now that he's off CRRT but patient very catabolic.  - Will give a trial of iHD today; very possible he won't tolerate or that he may be too catabolic with iHD (would require daily iHD). If he doesn't tolerate iHD or if he's too catabolic then will have to restart CRRT.   2. Respiratory failure reintubated now on the ventilator--> s/p trach in OR 10/10/2020  3. Neuroendocrine tumor s/p Whipple with unfortunate extensive small bowel necrosis s/p resection. Family is hopeful for a small bowel transplant, however this is quite a long ways off d/t multiple issues. CT 4/6 showing possible pelvic abscess, had been started on Zosyn  4. Severe malnutrition on TPN.  5. Anemia- Hgb 8.3   Subjective:   CRRT started 4/9 1600 and stopped 4/11 1800.     Objective:   BP (!) 94/56   Pulse 93   Temp (!) 97.3 F (36.3 C) (Axillary)   Resp (!) 24   Ht 6\' 2"  (1.88 m)   Wt 103.2 kg   SpO2 99%   BMI 29.21 kg/m   Intake/Output Summary (Last 24 hours) at 10/22/2020 1009 Last data filed at 10/22/2020 8099 Gross per 24 hour  Intake 2514.81 ml  Output 2505 ml  Net 9.81 ml   Weight change:   Physical Exam: Gen: ill-appearing on the vent, awake and  alert  CVS: tachycardic Resp: coarse mechanical Abd: distended, pancreatic drain in place, biliary drain, 2 JPs and a drain from the colostomy stoma Ext: tr edema Access: RIJ temp  Imaging: CT ABDOMEN PELVIS WO CONTRAST  Result Date: 10/21/2020 CLINICAL DATA:  History neuroendocrine tumor, post Whipple with intraoperative SMV laceration, ultimately with complete resection of the small bowel, now with postoperative abdominal pain and leukocytosis. EXAM: CT ABDOMEN AND PELVIS WITHOUT CONTRAST TECHNIQUE: Multidetector CT imaging of the abdomen and pelvis was performed following the standard protocol without IV contrast. COMPARISON:  CT the chest, abdomen pelvis-10/15/2020 FINDINGS: Lower chest: Limited visualization of the lower thorax demonstrates improved aeration of the lung bases with persistent consolidative opacities within the right lower lobe with associated air bronchograms. Ill-defined somewhat nodular tree-in-bud opacities are seen within the left lower lobe though improved compared to the 10/15/2020 examination. No new focal airspace opacities. No pleural effusion. Normal heart size. No pericardial effusion. Pacer lead terminates within the right ventricular apex. No pericardial effusion. Hepatobiliary: Normal hepatic contour. Redemonstrated approximately 7.6 x 6.0 x 2.6 cm fluid collection about the subcapsular aspect of the posterior segment of the right lobe of the liver (coronal image 58, series 6; axial image 31, series 3). Post cholecystectomy. Pancreas: Redemonstrated pancreatic drain within the remaining portions of the pancreas. A minimal amount of ill-defined fluid surrounds the pancreas though without definable/drainable fluid collection on this noncontrast examination. Spleen: Redemonstrated borderline splenomegaly  with the spleen measuring 15 cm in length (image 22, series 3). Adrenals/Urinary Tract: Redemonstrated apparent asymmetric atrophy of the right kidney in comparison to the  left. No renal stones. No urine obstruction. Normal noncontrast appearance the bilateral adrenal glands. Normal noncontrast appearance of the urinary bladder given degree of distention. Stomach/Bowel: Sequela Whipple and gastrojejunostomy with absence of the entirety the of small-bowel and ascending colon. Stable positioning balloon retention gastrostomy tube and multiple surgical drainage catheters. No new definable/drainable fluid collection within the abdomen or pelvis. Redemonstrated fluid collection with the pelvic cul-de-sac measuring approximately 4.9 x 4.9 cm (image 81, series 3) grossly unchanged compared to the 10/15/2020 examination, previously, 5.4 x 5.2 cm. Ill-defined fluid collection/hematoma within the left side of the root of the abdominal mesentery is unchanged measuring approximately 11.0 x 10.1 x 3.4 cm (coronal image 39, series 6; axial image 49, series 3), previously, 10.7 x 10.3 x 3.7 cm Separate ill-defined serpiginous fluid collection within the ventral aspect of the abdominal mesentery is also unchanged measuring approximately 4.0 x 3.2 x 2.2 cm (axial image 48, series 3; coronal image 25, series 6). Vascular/Lymphatic: Normal caliber the abdominal aorta. No bulky retroperitoneal, mesenteric, pelvic or inguinal lymphadenopathy on this noncontrast examination. Reproductive: Normal appearance the prostate gland. Other: Open wound within the ventral aspect of the right mid abdomen. Nodular areas of subcutaneous stranding within the left ventral abdominal wall likely at the location of subcutaneous medication administration. Mild diffuse body wall anasarca. Musculoskeletal: No acute or aggressive osseous abnormalities. IMPRESSION: 1. Stable sequela of Whipple, total small bowel resection and subtotal colectomy with stable positioning of multiple surgical drains, pancreatic duct catheter and balloon retention gastrostomy tube. 2. No new definable/drainable fluid collections within the abdomen or  pelvis. 3. Unchanged ill-defined fluid collections within the left side of the root of the abdominal mesentery, the ventral aspect of the abdominal mesentery, the subcapsular aspect of the right lobe of the liver and the pelvic cul-de-sac all of which are incompletely evaluated on this noncontrast examination though may represent areas of evolving hematomas giving imaging stability for the past week. 4. Redemonstrated ill-defined stranding surrounding the remaining portions of the pancreas without definable/drainable fluid collection on this noncontrast examination. 5. Improved aeration of the lung bases with persistent consolidative opacities within the right lower lobe, nonspecific though could be seen in the setting of aspiration. Electronically Signed   By: [name] M.D.   On: 10/21/2020 10:28   CT IMAGE GUIDED DRAINAGE BY PERCUTANEOUS CATHETER  Result Date: 10/21/2020 INDICATION: History of pancreatic neuroendocrine tumor, post Whipple with intraoperative sn the laceration ultimately requiring complete resection of the small bowel found to have a persistent fluid collection within the root of the abdominal mesentery. Given persistent fevers, request made for CT-guided aspiration and/or drainage catheter placement for infection source control purposes. EXAM: CT IMAGE GUIDED DRAINAGE BY PERCUTANEOUS CATHETER COMPARISON:  CT abdomen pelvis-earlier same day; 10/15/2020 MEDICATIONS: The patient is currently admitted to the hospital and receiving intravenous antibiotics. The antibiotics were administered within an appropriate time frame prior to the initiation of the procedure. ANESTHESIA/SEDATION: Moderate (conscious) sedation was employed during this procedure. A total of Versed 0.5 mg and Fentanyl 25 mcg was administered intravenously. Moderate Sedation Time: 15 minutes. The patient's level of consciousness and vital signs were monitored continuously by radiology nursing throughout the procedure under my  direct supervision. CONTRAST:  None COMPLICATIONS: None immediate. PROCEDURE: Informed written consent was obtained from the patient's family after a discussion of the  risks, benefits and alternatives to treatment. The patient was placed supine on the CT gantry and a pre procedural CT was performed re-demonstrating the known abscess/fluid collection within the root of the abdominal mesentery with dominant component measuring approximately 9.7 x 3.5 cm (image 15, series 3). The procedure was planned. A timeout was performed prior to the initiation of the procedure. The skin overlying the left lateral abdomen was prepped and draped in the usual sterile fashion. The overlying soft tissues were anesthetized with 1% lidocaine with epinephrine. Appropriate trajectory was planned with the use of a 22 gauge spinal needle. An 18 gauge trocar needle was advanced into the abscess/fluid collection and a short Amplatz super stiff wire was coiled within the collection. Appropriate positioning was confirmed with a limited CT scan. The tract was serially dilated allowing placement of a 10 [place] all-purpose drainage catheter. Appropriate positioning was confirmed with a limited postprocedural CT scan. Despite appropriate drainage catheter positioning, only approximately 2 cc of thick bloody fluid was able to be aspirated. As such, several rounds of attempted lavage aspiration for attempted however failed to yield any additional fluid. The tube was connected to a JP bulb and sutured in place. A dressing was placed. The patient tolerated the procedure well without immediate post procedural complication. IMPRESSION: Successful CT guided placement of a 10 French all purpose drain catheter into the postoperative hematoma within the root of the abdominal mesentery with aspiration of only 2 mL of thick bloody fluid. Samples were sent to the laboratory as requested by the ordering clinical team. PLAN: As this fluid collection appears to  represent a hematoma, if the drainage catheter fails to provide any significant yield within the next 1-2 days would have low hesitation to remove the drainage catheter at the patient's bedside. Above was discussed with providing surgeon, Dr. Zenia Resides, the time of procedure completion. Electronically Signed   By: [name] M.D.   On: 10/21/2020 16:58    Labs: BMET Recent Labs  Lab 10/19/20 0522 10/19/20 1545 10/20/20 0515 10/20/20 1643 10/21/20 0511 10/21/20 1756 10/22/20 0607  NA 139 134* 134* 133* 131* 134* 134*  K 3.0* 3.4* 3.4* 3.4* 3.2* 3.5 3.1*  CL 106 100 99 100 98 97* 96*  CO2 25 27 22 23 22 23 25   GLUCOSE 167* 206* 177* 195* 208* 194* 206*  BUN 78* 54* 43* 40* 64* 89* 112*  CREATININE 2.57* 1.87* 1.61* 1.58* 2.67* 3.46* 4.06*  CALCIUM 7.7* 7.8* 8.1* 8.4* 8.3* 8.6* 8.6*  PHOS 1.8* 2.8 2.0* 2.2* 3.1 4.8* 5.2*   CBC Recent Labs  Lab 10/19/20 0522 10/20/20 0515 10/21/20 0511 10/22/20 0607  WBC 10.3 13.1* 19.2* 17.6*  NEUTROABS  --  9.3*  --   --   HGB 8.3* 9.7* 10.3* 9.6*  HCT 25.6* 30.0* 31.2* 31.0*  MCV 86.2 85.7 86.7 88.3  PLT 270 331 411* 361    Medications:    . chlorhexidine  15 mL Mouth Rinse BID  . Chlorhexidine Gluconate Cloth  6 each Topical Daily  . heparin injection (subcutaneous)  5,000 Units Subcutaneous Q8H  . insulin aspart  0-20 Units Subcutaneous Q4H  . insulin aspart  2 Units Subcutaneous Q4H  . mouth rinse  15 mL Mouth Rinse q12n4p  . pantoprazole (PROTONIX) IV  40 mg Intravenous BID  . sodium chloride flush  10-40 mL Intracatheter Q12H  . sodium chloride flush  5 mL Intracatheter Q8H      Otelia Santee, MD 10/22/2020, 10:09 AM

## 2020-10-23 DIAGNOSIS — D3A8 Other benign neuroendocrine tumors: Secondary | ICD-10-CM | POA: Diagnosis not present

## 2020-10-23 LAB — GLUCOSE, CAPILLARY
Glucose-Capillary: 202 mg/dL — ABNORMAL HIGH (ref 70–99)
Glucose-Capillary: 214 mg/dL — ABNORMAL HIGH (ref 70–99)
Glucose-Capillary: 155 mg/dL — ABNORMAL HIGH (ref 70–99)
Glucose-Capillary: 156 mg/dL — ABNORMAL HIGH (ref 70–99)
Glucose-Capillary: 161 mg/dL — ABNORMAL HIGH (ref 70–99)
Glucose-Capillary: 158 mg/dL — ABNORMAL HIGH (ref 70–99)

## 2020-10-23 LAB — COMPREHENSIVE METABOLIC PANEL
ALT: 105 U/L — ABNORMAL HIGH (ref 0–44)
AST: 103 U/L — ABNORMAL HIGH (ref 15–41)
Albumin: 1.5 g/dL — ABNORMAL LOW (ref 3.5–5.0)
Alkaline Phosphatase: 98 U/L (ref 38–126)
Anion gap: 12 (ref 5–15)
BUN: 74 mg/dL — ABNORMAL HIGH (ref 6–20)
CO2: 29 mmol/L (ref 22–32)
Calcium: 8.4 mg/dL — ABNORMAL LOW (ref 8.9–10.3)
Chloride: 93 mmol/L — ABNORMAL LOW (ref 98–111)
Creatinine, Ser: 3.32 mg/dL — ABNORMAL HIGH (ref 0.61–1.24)
GFR, Estimated: 22 mL/min — ABNORMAL LOW (ref >60)
Glucose, Bld: 205 mg/dL — ABNORMAL HIGH (ref 70–99)
Potassium: 3 mmol/L — ABNORMAL LOW (ref 3.5–5.1)
Sodium: 134 mmol/L — ABNORMAL LOW (ref 135–145)
Total Bilirubin: 4.4 mg/dL — ABNORMAL HIGH (ref 0.3–1.2)
Total Protein: 7.5 g/dL (ref 6.5–8.1)

## 2020-10-23 LAB — RENAL FUNCTION PANEL
Albumin: 1.5 g/dL — ABNORMAL LOW (ref 3.5–5.0)
Anion gap: 15 (ref 5–15)
BUN: 93 mg/dL — ABNORMAL HIGH (ref 6–20)
CO2: 27 mmol/L (ref 22–32)
Calcium: 8.5 mg/dL — ABNORMAL LOW (ref 8.9–10.3)
Chloride: 93 mmol/L — ABNORMAL LOW (ref 98–111)
Creatinine, Ser: 4.12 mg/dL — ABNORMAL HIGH (ref 0.61–1.24)
GFR, Estimated: 17 mL/min — ABNORMAL LOW (ref >60)
Glucose, Bld: 144 mg/dL — ABNORMAL HIGH (ref 70–99)
Phosphorus: 3.5 mg/dL (ref 2.5–4.6)
Potassium: 3.3 mmol/L — ABNORMAL LOW (ref 3.5–5.1)
Sodium: 135 mmol/L (ref 135–145)

## 2020-10-23 LAB — CBC
HCT: 28.7 % — ABNORMAL LOW (ref 39.0–52.0)
Hemoglobin: 9.3 g/dL — ABNORMAL LOW (ref 13.0–17.0)
MCH: 27.8 pg (ref 26.0–34.0)
MCHC: 32.4 g/dL (ref 30.0–36.0)
MCV: 85.7 fL (ref 80.0–100.0)
Platelets: 334 10*3/uL (ref 150–400)
RBC: 3.35 MIL/uL — ABNORMAL LOW (ref 4.22–5.81)
RDW: 17.4 % — ABNORMAL HIGH (ref 11.5–15.5)
WBC: 15.2 10*3/uL — ABNORMAL HIGH (ref 4.0–10.5)
nRBC: 0.4 % — ABNORMAL HIGH (ref 0.0–0.2)

## 2020-10-23 LAB — TRIGLYCERIDES: Triglycerides: 328 mg/dL — ABNORMAL HIGH (ref <150)

## 2020-10-23 LAB — MAGNESIUM: Magnesium: 2 mg/dL (ref 1.7–2.4)

## 2020-10-23 LAB — PHOSPHORUS: Phosphorus: 3 mg/dL (ref 2.5–4.6)

## 2020-10-23 MED ORDER — FAT EMULSION PLANT BASED 20% (INTRALIPID)IV EMUL
250.0000 mL | INTRAVENOUS | Status: AC
  Administered 2020-10-23: 250 mL via INTRAVENOUS
  Filled 2020-10-23: qty 250

## 2020-10-23 MED ORDER — ZINC CHLORIDE 1 MG/ML IV SOLN
INTRAVENOUS | Status: AC
  Filled 2020-10-23: qty 1246.4

## 2020-10-23 MED ORDER — POTASSIUM CHLORIDE 10 MEQ/50ML IV SOLN
10.0000 meq | INTRAVENOUS | Status: AC
  Administered 2020-10-23 (×3): 10 meq via INTRAVENOUS
  Filled 2020-10-23 (×3): qty 50

## 2020-10-23 MED ORDER — FAT EMULSION PLANT BASED 20% (INTRALIPID)IV EMUL
250.0000 mL | INTRAVENOUS | Status: DC
  Filled 2020-10-23: qty 250

## 2020-10-23 MED ORDER — HEPARIN SODIUM (PORCINE) 1000 UNIT/ML IJ SOLN
INTRAMUSCULAR | Status: AC
  Filled 2020-10-23: qty 8

## 2020-10-23 NOTE — Progress Notes (Signed)
6 Days Post-Op  Subjective: Patient has been afebrile for last 24 hours. WBC slowly downtrending. Had iHD yesterday. Off pressors this morning.  Objective: Vital signs in last 24 hours: Temp:  [97.3 F (36.3 C)-99.2 F (37.3 C)] 98.8 F (37.1 C) (04/14 0400) Pulse Rate:  [42-108] 87 (04/14 0600) Resp:  [18-29] 24 (04/14 0600) BP: (68-122)/(52-87) 101/63 (04/14 0600) SpO2:  [91 %-100 %] 97 % (04/14 0600) FiO2 (%):  [28 %-35 %] 30 % (04/14 0336) Weight:  [103 kg] 103 kg (04/13 1958) Last BM Date:  (pta)  Intake/Output from previous day: 04/13 0701 - 04/14 0700 In: 2561.2 [I.V.:2231.2; IV Piggyback:330] Out: 2545 [Urine:1250; Drains:1295] Intake/Output this shift: No intake/output data recorded.  PE: General: no acute distress Neuro: alert, follows commands HEENT: trach in place, site clean and dry. Resp: normal work of breathing, on vent Vent Mode: PRVC FiO2 (%):  [28 %-35 %] 30 % Set Rate:  [24 bmp] 24 bmp Vt Set:  [650 mL] 650 mL PEEP:  [5 cmH20] 5 cmH20 Pressure Support:  [10 cmH20] 10 cmH20 Plateau Pressure:  [20 FGH82-99 cmH20] 24 cmH20 CV: RRR Abdomen: soft, nondistended, wound vac in place on midline incision. RLQ ostomy site is clean and granulating. JPx2 with milky brown fluid. Biliary drain with bile. LUQ pancreatic stent with clear colorless fluid. G tube in place to gravity LUQ. LUQ perc drain with scant old blood.   Lab Results:  Recent Labs    10/22/20 0607 10/23/20 0552  WBC 17.6* 15.2*  HGB 9.6* 9.3*  HCT 31.0* 28.7*  PLT 361 334   BMET Recent Labs    10/22/20 1634 10/23/20 0552  NA 135 134*  K 3.0* 3.0*  CL 95* 93*  CO2 27 29  GLUCOSE 189* 205*  BUN 129* 74*  CREATININE 4.35* 3.32*  CALCIUM 8.5* 8.4*   PT/INR Recent Labs    10/21/20 1418  LABPROT 22.1*  INR 1.9*   CMP     Component Value Date/Time   NA 134 (L) 10/23/2020 0552   NA 142 07/21/2020 0921   K 3.0 (L) 10/23/2020 0552   CL 93 (L) 10/23/2020 0552   CO2 29  10/23/2020 0552   GLUCOSE 205 (H) 10/23/2020 0552   BUN 74 (H) 10/23/2020 0552   BUN 16 07/21/2020 0921   CREATININE 3.32 (H) 10/23/2020 0552   CREATININE 1.16 03/01/2016 1607   CALCIUM 8.4 (L) 10/23/2020 0552   PROT 7.5 10/23/2020 0552   PROT 6.7 07/21/2020 0921   ALBUMIN 1.5 (L) 10/23/2020 0552   ALBUMIN 4.1 07/21/2020 0921   AST 103 (H) 10/23/2020 0552   ALT 105 (H) 10/23/2020 0552   ALKPHOS 98 10/23/2020 0552   BILITOT 4.4 (H) 10/23/2020 0552   BILITOT 0.8 07/21/2020 0921   GFRNONAA 22 (L) 10/23/2020 0552   GFRNONAA >89 09/22/2015 1008   GFRAA 82 07/21/2020 0921   GFRAA >89 09/22/2015 1008   Lipase     Component Value Date/Time   LIPASE 23 08/22/2020 0318       Studies/Results: CT ABDOMEN PELVIS WO CONTRAST  Result Date: 10/21/2020 CLINICAL DATA:  History neuroendocrine tumor, post Whipple with intraoperative SMV laceration, ultimately with complete resection of the small bowel, now with postoperative abdominal pain and leukocytosis. EXAM: CT ABDOMEN AND PELVIS WITHOUT CONTRAST TECHNIQUE: Multidetector CT imaging of the abdomen and pelvis was performed following the standard protocol without IV contrast. COMPARISON:  CT the chest, abdomen pelvis-10/15/2020 FINDINGS: Lower chest: Limited visualization of the  lower thorax demonstrates improved aeration of the lung bases with persistent consolidative opacities within the right lower lobe with associated air bronchograms. Ill-defined somewhat nodular tree-in-bud opacities are seen within the left lower lobe though improved compared to the 10/15/2020 examination. No new focal airspace opacities. No pleural effusion. Normal heart size. No pericardial effusion. Pacer lead terminates within the right ventricular apex. No pericardial effusion. Hepatobiliary: Normal hepatic contour. Redemonstrated approximately 7.6 x 6.0 x 2.6 cm fluid collection about the subcapsular aspect of the posterior segment of the right lobe of the liver (coronal  image 58, series 6; axial image 31, series 3). Post cholecystectomy. Pancreas: Redemonstrated pancreatic drain within the remaining portions of the pancreas. A minimal amount of ill-defined fluid surrounds the pancreas though without definable/drainable fluid collection on this noncontrast examination. Spleen: Redemonstrated borderline splenomegaly with the spleen measuring 15 cm in length (image 22, series 3). Adrenals/Urinary Tract: Redemonstrated apparent asymmetric atrophy of the right kidney in comparison to the left. No renal stones. No urine obstruction. Normal noncontrast appearance the bilateral adrenal glands. Normal noncontrast appearance of the urinary bladder given degree of distention. Stomach/Bowel: Sequela Whipple and gastrojejunostomy with absence of the entirety the of small-bowel and ascending colon. Stable positioning balloon retention gastrostomy tube and multiple surgical drainage catheters. No new definable/drainable fluid collection within the abdomen or pelvis. Redemonstrated fluid collection with the pelvic cul-de-sac measuring approximately 4.9 x 4.9 cm (image 81, series 3) grossly unchanged compared to the 10/15/2020 examination, previously, 5.4 x 5.2 cm. Ill-defined fluid collection/hematoma within the left side of the root of the abdominal mesentery is unchanged measuring approximately 11.0 x 10.1 x 3.4 cm (coronal image 39, series 6; axial image 49, series 3), previously, 10.7 x 10.3 x 3.7 cm Separate ill-defined serpiginous fluid collection within the ventral aspect of the abdominal mesentery is also unchanged measuring approximately 4.0 x 3.2 x 2.2 cm (axial image 48, series 3; coronal image 25, series 6). Vascular/Lymphatic: Normal caliber the abdominal aorta. No bulky retroperitoneal, mesenteric, pelvic or inguinal lymphadenopathy on this noncontrast examination. Reproductive: Normal appearance the prostate gland. Other: Open wound within the ventral aspect of the right mid  abdomen. Nodular areas of subcutaneous stranding within the left ventral abdominal wall likely at the location of subcutaneous medication administration. Mild diffuse body wall anasarca. Musculoskeletal: No acute or aggressive osseous abnormalities. IMPRESSION: 1. Stable sequela of Whipple, total small bowel resection and subtotal colectomy with stable positioning of multiple surgical drains, pancreatic duct catheter and balloon retention gastrostomy tube. 2. No new definable/drainable fluid collections within the abdomen or pelvis. 3. Unchanged ill-defined fluid collections within the left side of the root of the abdominal mesentery, the ventral aspect of the abdominal mesentery, the subcapsular aspect of the right lobe of the liver and the pelvic cul-de-sac all of which are incompletely evaluated on this noncontrast examination though may represent areas of evolving hematomas giving imaging stability for the past week. 4. Redemonstrated ill-defined stranding surrounding the remaining portions of the pancreas without definable/drainable fluid collection on this noncontrast examination. 5. Improved aeration of the lung bases with persistent consolidative opacities within the right lower lobe, nonspecific though could be seen in the setting of aspiration. Electronically Signed   By: [name] M.D.   On: 10/21/2020 10:28   CT IMAGE GUIDED DRAINAGE BY PERCUTANEOUS CATHETER  Result Date: 10/21/2020 INDICATION: History of pancreatic neuroendocrine tumor, post Whipple with intraoperative sn the laceration ultimately requiring complete resection of the small bowel found to have a persistent fluid  collection within the root of the abdominal mesentery. Given persistent fevers, request made for CT-guided aspiration and/or drainage catheter placement for infection source control purposes. EXAM: CT IMAGE GUIDED DRAINAGE BY PERCUTANEOUS CATHETER COMPARISON:  CT abdomen pelvis-earlier same day; 10/15/2020 MEDICATIONS: The  patient is currently admitted to the hospital and receiving intravenous antibiotics. The antibiotics were administered within an appropriate time frame prior to the initiation of the procedure. ANESTHESIA/SEDATION: Moderate (conscious) sedation was employed during this procedure. A total of Versed 0.5 mg and Fentanyl 25 mcg was administered intravenously. Moderate Sedation Time: 15 minutes. The patient's level of consciousness and vital signs were monitored continuously by radiology nursing throughout the procedure under my direct supervision. CONTRAST:  None COMPLICATIONS: None immediate. PROCEDURE: Informed written consent was obtained from the patient's family after a discussion of the risks, benefits and alternatives to treatment. The patient was placed supine on the CT gantry and a pre procedural CT was performed re-demonstrating the known abscess/fluid collection within the root of the abdominal mesentery with dominant component measuring approximately 9.7 x 3.5 cm (image 15, series 3). The procedure was planned. A timeout was performed prior to the initiation of the procedure. The skin overlying the left lateral abdomen was prepped and draped in the usual sterile fashion. The overlying soft tissues were anesthetized with 1% lidocaine with epinephrine. Appropriate trajectory was planned with the use of a 22 gauge spinal needle. An 18 gauge trocar needle was advanced into the abscess/fluid collection and a short Amplatz super stiff wire was coiled within the collection. Appropriate positioning was confirmed with a limited CT scan. The tract was serially dilated allowing placement of a 10 [place] all-purpose drainage catheter. Appropriate positioning was confirmed with a limited postprocedural CT scan. Despite appropriate drainage catheter positioning, only approximately 2 cc of thick bloody fluid was able to be aspirated. As such, several rounds of attempted lavage aspiration for attempted however failed to  yield any additional fluid. The tube was connected to a JP bulb and sutured in place. A dressing was placed. The patient tolerated the procedure well without immediate post procedural complication. IMPRESSION: Successful CT guided placement of a 10 French all purpose drain catheter into the postoperative hematoma within the root of the abdominal mesentery with aspiration of only 2 mL of thick bloody fluid. Samples were sent to the laboratory as requested by the ordering clinical team. PLAN: As this fluid collection appears to represent a hematoma, if the drainage catheter fails to provide any significant yield within the next 1-2 days would have low hesitation to remove the drainage catheter at the patient's bedside. Above was discussed with providing surgeon, Dr. Zenia Resides, the time of procedure completion. Electronically Signed   By: [name] M.D.   On: 10/21/2020 16:58    Assessment/Plan 50 yo male 1 month s/p Whipple, right colectomy and SMV repair for T3N0 neuroendocrine tumor. C/b postop hemorrhagic shock with DIC and subsequent diffuse small bowel necrosis, now s/p small bowel resection with external drainage of bile duct, pancreatic duct and stomach. - Continue zosyn and antifungal - Blood cultures negative to date - Acute renal failure: intermittent HD, nephrology following - Continue TPN. Will ultimately need tunneled line. - G tube to gravity - Continue trach collar trials during day - Vac change MWF - Aggressive PT/OT - VTE: SQH - Dispo: ICU    LOS: 31 days    Michaelle Birks, MD [facility], Hepatobiliary and Pancreatic Surgery 10/23/20 7:20 AM

## 2020-10-23 NOTE — Progress Notes (Signed)
NAMEMICHAELA SHANKEL, MRN:  [id number], DOB:  12-Apr-1971, LOS: 43 ADMISSION DATE:  10/06/2020, CONSULTATION DATE:  10/23/20 REFERRING MD:  Anesthesia, CHIEF COMPLAINT:  Whipple, post-op shock   Brief History:  50 y.o. M with PMH of neuroendocrine tumor and underwent Whipple procedure 3/14 with intra-operative SMV laceration and repair by vascular with bovine pericardial patch.  Pt had worsening shock overnight and required three pressors despite massive transfusion protocol (received 150 blood products).  He was taken back to the OR for washout on 3/15 and returned to the ICU intubated.  Taken back 3/17 again found to have small bowel necrosis s/p resection.  Plans again to return 3/19 to OR.   Totality of small bowel has been resected. The case was discussed with [facility] who may consider eval for sm intestinal transplant pending clinical course.  Continuing aggressive supportive care.   Past Medical History:   has a past medical history of AICD (automatic cardioverter/defibrillator) present, Dyslipidemia, Heart failure with mildly reduced EF, History of kidney stones, HTN (hypertension), ventricular fibrillation, and Nonischemic cardiomyopathy.  Significant Hospital Events:  3/14: Admit to Surgery, to OR for whipple, SMV injury and repair, massive transfusion protocol overnight  3/15: Back to OR for washout, x2. IR for arteriogram. No major bleed source identified in OR, or arterial bleed w IR. Robust product resuscitation >75 products (+ TXA + novoseven) 3/16: off pressors, add'l 39 products overnight. Slowing resuscitation this morning with hemodynamic improvements and slowing drain output; OR x 2 - washout, biliary t tube, wound vac -- washout, wound vac, IR for arteriogram -- no arterial bleed for embolization 3/17:  Aggressive balanced transfusion continue overnight with marked hemodynamic improvement since yesterday evening. Off pressors x several hours, Wound vac output significantly slowing  (from 571ml q15-110min to 567ml q1hr+) and output is much thinner, Thin bloody secretions from mouth approx 287ml, Decreased RR from 22 to 18, Unable to tolerate NE- severe bradycardia 3/19 taken back to OR. Small bowel noted to be almost completely necrosed. Patient was closed with plans for goals of car discussion.  3/20 GOC family endorses full scope of treatment. Primary team discussed with [facility] the possibility of transfer for small bowel transplant. [facility] felt as though transfer would not change outcome. 3/21 Ongoing discussion with family and [facility]. Patient may be a candidate for transplant if he can stabilize post a small bowel resection. He was taken back to the OR and small bowel was resected.  3/23 weaning pressors. Versed off. Remains encephalopathic 3/24 off pressors. Low dose dilaudid gtt -- only weakly grimaces to pain. Changing sedation to precedex + PRN fentanyl. Long discussion with 2 brothers regarding clinical case -- tried to clarify that while the term "stable" has been used, in this instance is meaning that he has not declined from previous shift but is in fact still critically ill with multisystem organ dysfunction.  3/25 Cr and BUN increased. Off sedation  3/26 Pressors off, CT head benign, BUN/Cr continue to creep up 3/27 Tachycardia/HTN overnight improved with fentanyl gtt 2/28 PCCM sign off. Trauma to take over vent/CCM needs.  3/30 extubated 4/2 desaturated overnight to the 30s improved with BVM and NTS.  4/5 poor airway protection. PCCM consulted for re-intubation.  4/6 CT a/p Large RLL opacity, smaller LLL opacity. Post op changes. 5.4cm fluid collection in posterior pelvis, concerning for abscess. Started on Zosyn  4/7 got transfused overnight and then diuresed. Worse renal function 4/9 transfused again. HD line inserted and started on  CRRT 4/10 started on trach collar trials. 4/12 off CRRT and transitioned to IHD. Surgery placed abdominal drain with drainage consistent  with hematoma 4/13 Tolerated dialysis  Consults:  PCCM VVS  IR  Palliative  Procedures:  PICC 3/25 > JP x 2  Biliary drain Pancreatic stent drain ETT 2/24>>3/13; 3/15 >> 3/29;  4/5 >  Significant Diagnostic Tests:  See Radiology tab   Most recent significant exam was CT a/p 4/6: which revealed R>L pulmonary opacities, and pelvic fluid collection concerning for abscess as well as detailing the numerous post-op changes.   Micro Data:  See micro tab 4/5 trach asp >> Gram neg coccobacilli   Antimicrobials:  Zosyn 3/21 > 3/29; 4/5 >> Eraxis 3/23> 3/29  Interim History / Subjective:   Tolerated dialysis yesterday. Renal planning for repeat sedation Tolerating trach collar yesterday    Objective   Blood pressure 101/63, pulse 87, temperature 99.1 F (37.3 C), temperature source Oral, resp. rate (!) 24, height 6\' 2"  (1.88 m), weight 103 kg, SpO2 97 %.    Vent Mode: PRVC FiO2 (%):  [28 %-30 %] 28 % Set Rate:  [24 bmp] 24 bmp Vt Set:  [650 mL] 650 mL PEEP:  [5 cmH20] 5 cmH20 Pressure Support:  [10 cmH20] 10 cmH20 Plateau Pressure:  [20 cmH20-24 cmH20] 24 cmH20   Intake/Output Summary (Last 24 hours) at 10/23/2020 1105 Last data filed at 10/23/2020 1610 Gross per 24 hour  Intake 2253.08 ml  Output 2545 ml  Net -291.92 ml   Filed Weights   10/19/20 0400 10/22/20 1630 10/22/20 1958  Weight: 103.2 kg 103 kg 103 kg   Physical Exam: General: Chronically ill-appearing, no acute distress HENT: [place], AT, OP clear, MMM Neck: Trach in place, c/d/i Eyes: EOMI Respiratory: Clear to auscultation bilaterally.  No crackles, wheezing or rales Cardiovascular: RRR, -M/R/G, no JVD GI: Wound vac in plcae, multiple drains in place, Extremities:Muscle wasting, -edema,-tenderness Neuro: AAO x4, CNII-XII grossly intact, follows commands, moves extremities x 4  K 3.0 BUN/Cr 74/3.32 Albumin 1.5 AST 103  ALT 105  WBC 15.2 Hg 9.3  Resolved Hospital Problem list   Hemorrhagic  shock hypophosphatemia Septic shock  Assessment & Plan:   Critically ill due to acute hypoxemic respiratory failure requiring mechanical ventilation. due to generalized weakness and possible aspiration pneumonia -Daily SBT and trach collar as tolerated with rest on full support in between. Complete trials during the day to allow uninterrupted sleep at nighttime.  - Start working with Passy-Muir valve.  - Continue PT  Pancreatic neuroendocrine tumor s/p whipple 3/21 b/c hemorrhagic shock, c/b small bowel necrosis now s/p total small bowel resection and externa; drainage for bile duct pancreatic duct and stomach.  - Continue TPN - Will need tunneled access for TPN to decrease infection risk. Will need to hold off for now given fever and leukocytosis.  - Family's continued hope is for transfer to [facility] for small bowel transplant. Suspect pt has quite a ways to go before stable enough to be considered, and am unsure if underlying conditions like NICM would preclude candidacy.   Fever and leukocytosis Afebrile 48 hours. CT abdomen with no new fluid collections. - F/u blood cultures from 4/12  - Continue current Zosyn and Eraxis for further 10 days.    AKI, worse since reintubation - possible hypotensive hit [facility] consult. Plan for CRRT vs HD for this upcoming weekend .  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   NICM s/p ICD - EF has improved based on recent echocardiogram.  -Well compensated at this time.  - Lack of digestive tract precludes usual GDHFT.  [place] Suspect poor prognosis.  The family hopes for a small intestine transplant --[facility] has suggested he might be candidate but only if we can get him to the point being rehabilitation ready.     Daily Goals Checklist  Pain/Anxiety/Delirium protocol (if indicated): PRN only for RASS goal 0 to  -1 VAP protocol (if indicated): bundle in place.  DVT prophylaxis: SCD Nutrition Status: TPN - needs tunneled line. Plan for early next week if fever and leukocytosis improve.  GI prophylaxis: Pantoprazole Urinary catheter: external Central lines: PICC 3/25 Glucose control: -SSI + TPN Mobility/therapy needs: Mobilize to chair if possible Restraints: Restraint type:NA Reason for restraints:NA Daily labs: CBC, CMP daily Code Status: Partial - -no CV/DF (has an ICD however) Family Communication: per primary Disposition: ICU.  Goals of Care:  Last date of multidisciplinary goals of care discussion: 50/5 Family and staff present: Dr. Zenia Resides Summary of discussion: Family wishes for aggressive care.   Code Status:  Partial - no CV/DF by [place] (has an ICD which is not to be turned off)     The patient is critically ill with multiple organ systems failure and requires high complexity decision making for assessment and support, frequent evaluation and titration of therapies, application of advanced monitoring technologies and extensive interpretation of multiple databases.  Independent Critical Care Time: 36 Minutes.   Rodman Pickle, M.D. [facility] Pulmonary/Critical Care Medicine 10/23/2020 11:05 AM   Please see Amion for pager number to reach on-call Pulmonary and Critical Care Team.

## 2020-10-23 NOTE — Progress Notes (Signed)
Nutrition Follow-up  DOCUMENTATION CODES:   Not applicable  INTERVENTION:   TNA to meet nutrition needs 20% intralipid 250 ml/12 hr every Mon, Thur to prevent EFAD   Pt at high risk of malnutrition due to catabolic state.  Multiple drains in place with  1295 ml output  NUTRITION DIAGNOSIS:   Increased nutrient needs related to post-op healing as evidenced by estimated needs. Ongoing.   GOAL:   Patient will meet greater than or equal to 90% of their needs Met on TNA.   MONITOR:   Vent status,Labs  REASON FOR ASSESSMENT:   Ventilator    ASSESSMENT:   Pt with a PMH of HTN, HLD, HF with reduced EF, AICD and dx mass adjacent to head of the pancreas in 2021.  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  Developed cholecystitis in Feb 2022 and had percutaneous cholecystectomy tube placement.  Presented this admission for neuroendocrine tumor resection with Whipple procedure, also had right total colectomy, colostomy and cholecystectomy on 09/16/2020.  SMV was lacerated during procedure and repaired by bovine pericardial patch.  Developed hemorrhagic shock with bile leak post-op and underwent placement of biliary T tube with abdominal washout on 3/15.    Pt discussed during ICU rounds and with RN. Per CCM [facility] evaluating for SB transplant.  Pt on trach collar. Complains of nausea today has PRN medications for this.  Per renal may need to reconsider CRRT.    3/14 s/p whipple; intra-operative SMV laceration s/p repair; massive transfusion protocol - 120 products; maxed on pressors  3/15 s/p washout and VAC placement  3/16 off pressors; VAC output decreasing, hematemesis with NG not functioning, OG placed 3/17 s/p re-opening of recent laparotomy, SBR, VAC placement (per OR report pt with SB necrosis, only viable areas are stomach pb limb, 40-50 cm beyond GJ anastomosis, and colon) Pt will be TPN dependent. Concern that PB limb may become necrotic.  3/18 TPN  initiated @ 45 ml/hr with plans to advance to 90 ml on 3/19 3/22 entirety of SB resected due to necrosis.  3/25 lipids removed from TPN due to elevated TG 3/30 extubated  4/5 re-intubated 4/9-4/12 CRRT 4/13 iHD  Medications reviewed and include: SSI, novolog, protonix  Phenergan IV PRN KCl runs Labs reviewed: Na 134, K+: 3, BUN: 74, Cr: 3.32, Albumin 1.5, TG: 740 --> 328 CBG's: 187-214   Output:  UOP: 1250 ml   Drain 3: 450 ml  LLQ drain: 5 ml  R drain: 275 ml  RUQ drain: 55 ml  RLQ drain: 435 ml  20 F G tube: 25 ml Total output: 1295 ml  UF: 0 ml I&O: -6.9 L  TPN @ 95 ml/hr (250 ml 20% intralipid Mon, Thur) 7 day average provides: 2869 kcal and 187 grams protein   Diet Order:   Diet Order            Diet NPO time specified  Diet effective now                 EDUCATION NEEDS:   No education needs have been identified at this time  Skin:  Skin Assessment: Skin Integrity Issues: Skin Integrity Issues:: Stage III Stage III: neck Wound Vac: NA Incisions: abdomen, neck  Last BM:  none  Height:   Ht Readings from Last 1 Encounters:  10/14/20 _0  (1.88 m)    Weight:   Wt Readings from Last 1 Encounters:  10/22/20 103 kg    Ideal  Body Weight:     BMI:  Body mass index is 29.15 kg/m.  Estimated Nutritional Needs:   Kcal:  2800-3000  Protein:  185-210 grams  Fluid:  > 2L/day  Lockie Pares., RD, LDN, CNSC See AMiON for contact information

## 2020-10-23 NOTE — Progress Notes (Signed)
[facility] Progress Note     Assessment/ Plan:   1.Acute renal failure - he has reasons to be in ATN With hypotensive episodes requiring Levophed in the setting of a very complicated surgical history with multiple visits to the OR + DIC + diffuse small bowel necrosis. Creatinine fluctuating represents compromised renal function with unfortunately repeated insults. No evidence of obstruction. continued purulent drainage from JP drain in abdomen.   - Pt and family desire all aggressive interventions, confirmed 10/16/20 -startedCRRT 10/18/20-10/20/20 1800d/w PCCM/ CCS, all 4K bath, no heparin - Avoid nephrotoxins and dose medications for a GFR < 15 ml/min.  - Will continue monitoring for any signs of renal recovery;  UOP increased now that he's off CRRT but patient very catabolic.  - Tolerated  iHD #1 on 4/13; planning on another treatment today and then next one Sat. We will see if it's possible to perform iHD 4x/ week; if he's  too catabolic then  will have to convert to  CRRT.   2. Respiratory failure reintubated now on the ventilator--> s/p trach in OR 10/25/2020  3. Neuroendocrine tumor s/p Whipple with unfortunate extensive small bowel necrosis s/p resection. Family is hopeful for a small bowel transplant, however this is quite a long ways off d/t multiple issues. CT 4/6 showing possible pelvic abscess, had been started on Zosyn  4. Severe malnutrition on TPN.  5. Anemia- Hgb 8.3   Subjective:   CRRT started 4/9 1600 and stopped 4/11 1800.  C/o nausea but denies pain/ chills.    Objective:   BP 101/63   Pulse 87   Temp 98.8 F (37.1 C) (Oral)   Resp (!) 24   Ht 6\' 2"  (1.88 m)   Wt 103 kg   SpO2 97%   BMI 29.15 kg/m   Intake/Output Summary (Last 24 hours) at 10/23/2020 0754 Last data filed at 10/23/2020 7062 Gross per 24 hour  Intake 2561.18 ml  Output 2545 ml  Net 16.18 ml   Weight change:   Physical Exam: Gen: ill-appearing on the vent,  awake and alert  CVS: tachycardic Resp: coarse mechanical Abd: distended, pancreatic drain in place, biliary drain, 2 JPs and a drain from the colostomy stoma Ext: no edema in LE Access: RIJ temp  Imaging: CT ABDOMEN PELVIS WO CONTRAST  Result Date: 10/21/2020 CLINICAL DATA:  History neuroendocrine tumor, post Whipple with intraoperative SMV laceration, ultimately with complete resection of the small bowel, now with postoperative abdominal pain and leukocytosis. EXAM: CT ABDOMEN AND PELVIS WITHOUT CONTRAST TECHNIQUE: Multidetector CT imaging of the abdomen and pelvis was performed following the standard protocol without IV contrast. COMPARISON:  CT the chest, abdomen pelvis-10/15/2020 FINDINGS: Lower chest: Limited visualization of the lower thorax demonstrates improved aeration of the lung bases with persistent consolidative opacities within the right lower lobe with associated air bronchograms. Ill-defined somewhat nodular tree-in-bud opacities are seen within the left lower lobe though improved compared to the 10/15/2020 examination. No new focal airspace opacities. No pleural effusion. Normal heart size. No pericardial effusion. Pacer lead terminates within the right ventricular apex. No pericardial effusion. Hepatobiliary: Normal hepatic contour. Redemonstrated approximately 7.6 x 6.0 x 2.6 cm fluid collection about the subcapsular aspect of the posterior segment of the right lobe of the liver (coronal image 58, series 6; axial image 31, series 3). Post cholecystectomy. Pancreas: Redemonstrated pancreatic drain within the remaining portions of the pancreas. A minimal amount of ill-defined fluid surrounds the pancreas though without definable/drainable fluid collection on this  noncontrast examination. Spleen: Redemonstrated borderline splenomegaly with the spleen measuring 15 cm in length (image 22, series 3). Adrenals/Urinary Tract: Redemonstrated apparent asymmetric atrophy of the right kidney in  comparison to the left. No renal stones. No urine obstruction. Normal noncontrast appearance the bilateral adrenal glands. Normal noncontrast appearance of the urinary bladder given degree of distention. Stomach/Bowel: Sequela Whipple and gastrojejunostomy with absence of the entirety the of small-bowel and ascending colon. Stable positioning balloon retention gastrostomy tube and multiple surgical drainage catheters. No new definable/drainable fluid collection within the abdomen or pelvis. Redemonstrated fluid collection with the pelvic cul-de-sac measuring approximately 4.9 x 4.9 cm (image 81, series 3) grossly unchanged compared to the 10/15/2020 examination, previously, 5.4 x 5.2 cm. Ill-defined fluid collection/hematoma within the left side of the root of the abdominal mesentery is unchanged measuring approximately 11.0 x 10.1 x 3.4 cm (coronal image 39, series 6; axial image 49, series 3), previously, 10.7 x 10.3 x 3.7 cm Separate ill-defined serpiginous fluid collection within the ventral aspect of the abdominal mesentery is also unchanged measuring approximately 4.0 x 3.2 x 2.2 cm (axial image 48, series 3; coronal image 25, series 6). Vascular/Lymphatic: Normal caliber the abdominal aorta. No bulky retroperitoneal, mesenteric, pelvic or inguinal lymphadenopathy on this noncontrast examination. Reproductive: Normal appearance the prostate gland. Other: Open wound within the ventral aspect of the right mid abdomen. Nodular areas of subcutaneous stranding within the left ventral abdominal wall likely at the location of subcutaneous medication administration. Mild diffuse body wall anasarca. Musculoskeletal: No acute or aggressive osseous abnormalities. IMPRESSION: 1. Stable sequela of Whipple, total small bowel resection and subtotal colectomy with stable positioning of multiple surgical drains, pancreatic duct catheter and balloon retention gastrostomy tube. 2. No new definable/drainable fluid collections  within the abdomen or pelvis. 3. Unchanged ill-defined fluid collections within the left side of the root of the abdominal mesentery, the ventral aspect of the abdominal mesentery, the subcapsular aspect of the right lobe of the liver and the pelvic cul-de-sac all of which are incompletely evaluated on this noncontrast examination though may represent areas of evolving hematomas giving imaging stability for the past week. 4. Redemonstrated ill-defined stranding surrounding the remaining portions of the pancreas without definable/drainable fluid collection on this noncontrast examination. 5. Improved aeration of the lung bases with persistent consolidative opacities within the right lower lobe, nonspecific though could be seen in the setting of aspiration. Electronically Signed   By: [name] M.D.   On: 10/21/2020 10:28   CT IMAGE GUIDED DRAINAGE BY PERCUTANEOUS CATHETER  Result Date: 10/21/2020 INDICATION: History of pancreatic neuroendocrine tumor, post Whipple with intraoperative sn the laceration ultimately requiring complete resection of the small bowel found to have a persistent fluid collection within the root of the abdominal mesentery. Given persistent fevers, request made for CT-guided aspiration and/or drainage catheter placement for infection source control purposes. EXAM: CT IMAGE GUIDED DRAINAGE BY PERCUTANEOUS CATHETER COMPARISON:  CT abdomen pelvis-earlier same day; 10/15/2020 MEDICATIONS: The patient is currently admitted to the hospital and receiving intravenous antibiotics. The antibiotics were administered within an appropriate time frame prior to the initiation of the procedure. ANESTHESIA/SEDATION: Moderate (conscious) sedation was employed during this procedure. A total of Versed 0.5 mg and Fentanyl 25 mcg was administered intravenously. Moderate Sedation Time: 15 minutes. The patient's level of consciousness and vital signs were monitored continuously by radiology nursing throughout the  procedure under my direct supervision. CONTRAST:  None COMPLICATIONS: None immediate. PROCEDURE: Informed written consent was obtained from the patient's  family after a discussion of the risks, benefits and alternatives to treatment. The patient was placed supine on the CT gantry and a pre procedural CT was performed re-demonstrating the known abscess/fluid collection within the root of the abdominal mesentery with dominant component measuring approximately 9.7 x 3.5 cm (image 15, series 3). The procedure was planned. A timeout was performed prior to the initiation of the procedure. The skin overlying the left lateral abdomen was prepped and draped in the usual sterile fashion. The overlying soft tissues were anesthetized with 1% lidocaine with epinephrine. Appropriate trajectory was planned with the use of a 22 gauge spinal needle. An 18 gauge trocar needle was advanced into the abscess/fluid collection and a short Amplatz super stiff wire was coiled within the collection. Appropriate positioning was confirmed with a limited CT scan. The tract was serially dilated allowing placement of a 10 [place] all-purpose drainage catheter. Appropriate positioning was confirmed with a limited postprocedural CT scan. Despite appropriate drainage catheter positioning, only approximately 2 cc of thick bloody fluid was able to be aspirated. As such, several rounds of attempted lavage aspiration for attempted however failed to yield any additional fluid. The tube was connected to a JP bulb and sutured in place. A dressing was placed. The patient tolerated the procedure well without immediate post procedural complication. IMPRESSION: Successful CT guided placement of a 10 French all purpose drain catheter into the postoperative hematoma within the root of the abdominal mesentery with aspiration of only 2 mL of thick bloody fluid. Samples were sent to the laboratory as requested by the ordering clinical team. PLAN: As this fluid  collection appears to represent a hematoma, if the drainage catheter fails to provide any significant yield within the next 1-2 days would have low hesitation to remove the drainage catheter at the patient's bedside. Above was discussed with providing surgeon, Dr. Zenia Resides, the time of procedure completion. Electronically Signed   By: [name] M.D.   On: 10/21/2020 16:58    Labs: BMET Recent Labs  Lab 10/20/20 0515 10/20/20 1643 10/21/20 0511 10/21/20 1756 10/22/20 0607 10/22/20 1634 10/23/20 0552  NA 134* 133* 131* 134* 134* 135 134*  K 3.4* 3.4* 3.2* 3.5 3.1* 3.0* 3.0*  CL 99 100 98 97* 96* 95* 93*  CO2 22 23 22 23 25 27 29   GLUCOSE 177* 195* 208* 194* 206* 189* 205*  BUN 43* 40* 64* 89* 112* 129* 74*  CREATININE 1.61* 1.58* 2.67* 3.46* 4.06* 4.35* 3.32*  CALCIUM 8.1* 8.4* 8.3* 8.6* 8.6* 8.5* 8.4*  PHOS 2.0* 2.2* 3.1 4.8* 5.2* 5.0* 3.0   CBC Recent Labs  Lab 10/20/20 0515 10/21/20 0511 10/22/20 0607 10/23/20 0552  WBC 13.1* 19.2* 17.6* 15.2*  NEUTROABS 9.3*  --   --   --   HGB 9.7* 10.3* 9.6* 9.3*  HCT 30.0* 31.2* 31.0* 28.7*  MCV 85.7 86.7 88.3 85.7  PLT 331 411* 361 334    Medications:    . chlorhexidine  15 mL Mouth Rinse BID  . Chlorhexidine Gluconate Cloth  6 each Topical Daily  . heparin injection (subcutaneous)  5,000 Units Subcutaneous Q8H  . insulin aspart  0-20 Units Subcutaneous Q4H  . insulin aspart  2 Units Subcutaneous Q4H  . mouth rinse  15 mL Mouth Rinse q12n4p  . pantoprazole (PROTONIX) IV  40 mg Intravenous BID  . sodium chloride flush  10-40 mL Intracatheter Q12H  . sodium chloride flush  5 mL Intracatheter Q8H      Otelia Santee,  MD 10/23/2020, 7:54 AM

## 2020-10-23 NOTE — Plan of Care (Signed)
  Problem: Nutrition: Goal: Adequate nutrition will be maintained Outcome: Progressing   

## 2020-10-23 NOTE — Progress Notes (Signed)
PHARMACY - TOTAL PARENTERAL NUTRITION CONSULT NOTE  Indication:  Short bowel syndrome  Patient Measurements: Height: 6\' 2"  (188 cm) Weight: 124.9 kg (275 lb 5.7 oz) IBW/kg (Calculated) : 82.2 TPN AdjBW (KG): 95.3 Body mass index is 35.35 kg/m.  Assessment:  40 YOM with history of mass adjacent to head of the pancreas in 2021. 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  Developed cholecystitis in Feb 2022 and had percutaneous cholecystectomy tube placement.  Presented this admission for neuroendocrine tumor resection with Whipple procedure, also had right total colectomy, colostomy and cholecystectomy on 09/09/2020. SMV was lacerated during procedure and repaired by bovine pericardial patch. Developed hemorrhagic shock with bile leak post-op and underwent placement of biliary T tube with abdominal washout on 3/15 PM. Abdomen remains open. Pharmacy consulted to manage TPN.  Glucose / Insulin: no hx DM, A1c 5.2%. CBGs mostly controlled. Utilized 27 units SSI in the past 24h hours, 50 units in TPN, Novolog 2 units Q4H (hold for CBG < 150).   Electrolytes: low Na/CL, K down to 3, CoCa high normal at 10.4 (iCa 1.16 low normal on 4/7), Phos normalized, others WNL Renal: CRRT started 4/9 > 4/11, tolerated first iHD on 4/13 and to get HD 4/14 and 4/15 (4K baths), BUN down to 74 Hepatic: LFTs mildly elevated, tbili 4.4 (scleral yellowing still noted per RN 4/3) Albumin 1.5, prealbumin 22.8 (4/11) TG peaked at 740, now at 328 on 4/14 - Intralipids 100g/wk and monitor TG trends Intake / Output; MIVF: UOP 0.5 ml/kg/hr, drain (5 total): 1295L, 0cc from gastrostomy, net -6.9L (down) GI Imaging:  4/6 CT - pelvic fluid collection concerning for abscess GI Surgeries / Procedures:  3/14 Whipple procedure with R hemicolectomy and end ileostomy, cholecystectomy 3/15 washout, VAC placement 3/17 washout, VAC replacement.  Necrosis of entire small bowel except for the PB limb and  proximal 40-50cm of jejunum 3/19 re-opening laparotomy, abd closure.  Necrotic SB. 3/21 SB resection, take down of choledochojejunal anastomosis and pancreaticojejunal anastomosis, take down of duodenojejunal anastomosis, placement of externalized biliary drain / Stamm gastrostomy tube, IA washout and placement of intraperitoneal drains 3/30 extubation 4/8 trach  Central access: R IJ CVC placed 3/15; changed to PICC placed 10/03/20 TPN start date: 09/24/2020  Nutritional Goals (RD rec on 3/30): 2800-3000 kCal, 185-210g AA, fluid >2L/day  Current Nutrition:  TPN  Plan:  - Continue concentrated TPN at 95 ml/hr - Intralipid 20% 264mL Mon/Thurs for now to prevent EFAD - TPN with Intralipid 2x/wk provides a daily average of 187g AA and 2869 kCal, meeting 100% of patient needs.  - Electrolytes in TPN: increase Na 182mEq/L, increase K 81mEq/L, reduce Ca 38mEq/L, Mag 42mEq/L, no Phos for now, change Cl:Ac 1:2 - Daily multivitamin in TPN.  Remove standard trace elements and add back zinc 5mg , and selenium 30mcg.  Remove chromium while on RRT. - Continue resistant SSI Q4H, increase regular insulin in TPN to 60 units per DM coordinator recommendation, Novolog 2 units SQ Q6H. - KCL x 3 runs per discussion with Renal - Renal function BID  Sua Spadafora D. Mina Marble, PharmD, BCPS, [place] 10/23/2020, 11:14 AM

## 2020-10-23 NOTE — Progress Notes (Signed)
Inpatient Diabetes Program Recommendations  AACE/ADA: New Consensus Statement on Inpatient Glycemic Control   Target Ranges:  Prepandial:   less than 140 mg/dL      Peak postprandial:   less than 180 mg/dL (1-2 hours)      Critically ill patients:  140 - 180 mg/dL   Results for GORDON, VANDUNK (MRN [id number]) as of 10/23/2020 10:13  Ref. Range 10/22/2020 08:10 10/22/2020 11:51 10/22/2020 15:28 10/22/2020 20:11 10/22/2020 23:54 10/23/2020 03:48 10/23/2020 08:05  Glucose-Capillary Latest Ref Range: 70 - 99 mg/dL 216 (H) 178 (H) 169 (H) 153 (H) 187 (H) 202 (H) 214 (H)   Review of Glycemic Control  Current orders for Inpatient glycemic control: Novolog 0-20 units Q4H, Novolog 2 units Q4H, TPN @ 95 ml/hr with 50 units of regular insulin  Inpatient Diabetes Program Recommendations:    Insulin: Please consider increasing regular insulin in TPN to 60 units.  Thanks, Barnie Alderman, RN, MSN, CDE Diabetes Coordinator Inpatient Diabetes Program [phone] (Team Pager from 8am to 5pm)

## 2020-10-23 NOTE — Progress Notes (Signed)
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 s/p right hemicolectomy, end ileostomy, Whipple and patch angioplasty repair of SMV. postoperative hemorrhagic shock with DIC with extensive small bowel necrosis. All of small bowel has been resected, with external drainage of the bile duct, pancreas and stomach. Extubated 3/30. Reintubated 4/5. S/p tracheostomy 4/8. Started on CRRT 4/9. Started on trach collar trials 4/10.  Discontinued CRRT 4/11. PMH including AICD, heart failure, HTN, and dyslipidemia.    PT Comments    The pt is making slow but steady progress with PT goals for mobility and strength. The pt remains highly motivated, and was able to demo good engagement and effort in session focused on exercises for BLE and UE. The pt was able to generate good force against min/mod resistance, and consistently achieve full ROM against gravity. The pt and his family were educated on HEP of anti-gravity exercises he can perform for further progress outside of therapy sessions. Will continue to progress with strengthening, balance, and OOB mobility as tolerated. Continue to recommend CIR level therapies.     Follow Up Recommendations  CIR     Equipment Recommendations  Wheelchair (measurements PT);Wheelchair cushion (measurements PT);Hospital bed;Other (comment) (hoyer lift)    Recommendations for Other Services       Precautions / Restrictions Precautions Precautions: Fall Precaution Comments: Trach collar; x2 JP drains at abdomen and x2 biliary drains; wound vac Restrictions Weight Bearing Restrictions: No    Mobility  Bed Mobility Overal bed mobility: Needs Assistance Bed Mobility: Supine to Sit;Rolling Rolling: Min assist   Supine to sit: Mod assist     General bed mobility comments: minA  with verbal cues for positioning/sequencing to complete rolls for placement of pad. +2 to reposition in bed. pt able to pull trunk forward from elevated HOB with BUE support on bed rail and modA of 1    Transfers                 General transfer comment: deferred due to lack of +2 assistance, session focused on bed-level exercises           Cognition Arousal/Alertness: Awake/alert Behavior During Therapy: Flat affect Overall Cognitive Status: Difficult to assess                                 General Comments: pt following cues and simple commands consistently. able to verbalize when in need of break      Exercises General Exercises - Upper Extremity Elbow Flexion: Strengthening;Both;10 reps (against min manual resistance) Elbow Extension: Strengthening;Both;10 reps (against min manual resistance) General Exercises - Lower Extremity Short Arc Quad: AROM;Strengthening;Both;5 reps;Seated (x5 AROM, x5 against min manual resistance) Heel Slides: AROM;Strengthening;Both;5 reps;Seated (x5 AROM, x5 against min manual resistance) Hip ABduction/ADduction: AROM;Both;5 reps;Seated (SLR with hip abd/add) Straight Leg Raises: AROM;Both;10 reps Hip Flexion/Marching: AROM;Both;5 reps;Seated    General Comments General comments (skin integrity, edema, etc.): BP consistently soft (90s/50s) but slightly improved with consistent exercise      Pertinent Vitals/Pain Pain Assessment: Faces Faces Pain Scale: Hurts little more Pain Location: grimacing with muscular fatigue and increased ROM Pain Descriptors / Indicators: Discomfort;Grimacing Pain Intervention(s): Limited activity within patient's tolerance;Monitored during session;Repositioned           PT Goals (current  goals can now be found in the care plan section) Acute Rehab PT Goals Patient Stated Goal: to participate with PT PT Goal Formulation: With patient Time For Goal Achievement: 10/23/20 Potential to  Achieve Goals: Fair Progress towards PT goals: Progressing toward goals    Frequency    Min 3X/week      PT Plan Frequency needs to be updated;Discharge plan needs to be updated       AM-PAC PT "6 Clicks" Mobility   Outcome Measure  Help needed turning from your back to your side while in a flat bed without using bedrails?: A Lot Help needed moving from lying on your back to sitting on the side of a flat bed without using bedrails?: A Lot Help needed moving to and from a bed to a chair (including a wheelchair)?: Total Help needed standing up from a chair using your arms (e.g., wheelchair or bedside chair)?: Total Help needed to walk in hospital room?: Total Help needed climbing 3-5 steps with a railing? : Total 6 Click Score: 8    End of Session Equipment Utilized During Treatment: Oxygen Activity Tolerance: Patient tolerated treatment well Patient left: in bed;with call bell/phone within reach;with family/visitor present (in chair position in bed) Nurse Communication: Mobility status PT Visit Diagnosis: Muscle weakness (generalized) (M62.81);Difficulty in walking, not elsewhere classified (R26.2);Pain Pain - part of body:  (generalized)     Time: 3474-2595 PT Time Calculation (min) (ACUTE ONLY): 28 min  Charges:  $Therapeutic Exercise: 8-22 mins $Therapeutic Activity: 8-22 mins                     Karma Ganja, PT, DPT   Acute Rehabilitation Department Pager #: [phone]   Otho Bellows 10/23/2020, 5:58 PM

## 2020-10-23 NOTE — Progress Notes (Signed)
  Speech Language Pathology Treatment: Cognitive-Linquistic;Passy Muir Speaking valve  Patient Details Name: [name] MRN: [id number] DOB: 02/14/1971 Today's Date: 10/23/2020 Time: 4008-6761 SLP Time Calculation (min) (ACUTE ONLY): 20 min  Assessment / Plan / Recommendation Clinical Impression  Per pt and chart documentation tolerated trach collar for 4 hours in the morning and time on trach collar in the afternoon. PMV donned and pt reported phlegm in pharynx. Provided education and implementation of expectoration techniques versus using [place]. Head of bed elevated and cued to cough/gargle with saliva in attempts to mobilize mucous to oral cavity which was successful. Pt will respond in sentences but mostly in single word or short phrases suspected per preference. Expression was adequately coordinated with exhalation and increases intensity when requested. All vitals within normal range. Answered brother's [facility]), questions re: valve. Pt desired to leave valve donned while brother present. Educated if he becomes fatigued or time exceeds and 1.5 hours, request RN to remove valve.  '  HPI HPI: 61 yr with history of mass adjacent to head of the pancreas in 2021.  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  Developed cholecystitis in Feb 2022 and had percutaneous cholecystectomy tube placement.  Presented this admission for neuroendocrine tumor resection with Whipple procedure, also had right total colectomy, colostomy and cholecystectomy on 09/30/2020. SMV was lacerated during procedure and repaired by bovine pericardial patch. Developed hemorrhagic shock with bile leak post-op and underwent placement of biliary T tube with abdominal washout on 3/15 PM. All of small bowel has been resected, with external drainage of the bile duct, pancreas and stomach. Pt on TPN. intubated 3/14-3/30.      SLP Plan  Continue with current plan of care       Recommendations          Patient may use Passy-Muir Speech Valve: Intermittently with supervision PMSV Supervision: Full         General recommendations:  (depends on progress) Oral Care Recommendations: Oral care QID Follow up Recommendations: 24 hour supervision/assistance;Skilled Nursing facility SLP Visit Diagnosis: Aphonia (R49.1) Plan: Continue with current plan of care       GO                [name] 10/23/2020, 11:24 AM

## 2020-10-23 NOTE — Progress Notes (Signed)
Referring Physician(s): Dr. Michaelle Birks  Supervising Physician: Jacqulynn Cadet  Patient Status:  [facility] - In-pt  Chief Complaint:  Abdominal fluid collection  Subjective: Patient laying in bed resting comfortably in bed.  Alert, cooperative with exam. Left lateral abdominal drain site c/d/i with small amount of dark, bloody output.  Allergies: Ace inhibitors  Medications: Prior to Admission medications   Medication Sig Start Date End Date Taking? Authorizing Provider  acetaminophen (TYLENOL) 500 MG tablet Take 2 tablets (1,000 mg total) by mouth every 6 (six) hours. Patient taking differently: Take 1,000 mg by mouth every 6 (six) hours as needed for mild pain. 08/22/20  Yes [name], MD  allopurinol (ZYLOPRIM) 100 MG tablet Take one tablet by mouth daily for 1 week and then 2 tablets by mouth daily Patient taking differently: Take 200 mg by mouth daily. 07/03/20  Yes Swords, Darrick Penna, MD  amiodarone (PACERONE) 200 MG tablet Take 1 tablet (200 mg total) by mouth daily. Patient taking differently: Take 200 mg by mouth daily with supper. 07/30/20  Yes Evans Lance, MD  aspirin 81 MG tablet Take 1 tablet (81 mg total) by mouth 2 (two) times daily. Patient taking differently: Take 81 mg by mouth daily. 09/22/15  Yes Funches, Josalyn, MD  bisoprolol (ZEBETA) 10 MG tablet Take 1 tablet by mouth once daily Patient taking differently: Take 10 mg by mouth daily. 08/25/20  Yes Evans Lance, MD  budesonide-formoterol [facility]) 80-4.5 MCG/ACT inhaler Inhale 2 puffs into the lungs 2 (two) times daily. Must keep upcoming office visit for refills Patient taking differently: Inhale 2 puffs into the lungs 2 (two) times daily as needed (for flares). 11/09/19  Yes Fulp, Cammie, MD  sacubitril-valsartan (ENTRESTO) 24-26 MG Take 1 tablet by mouth 2 (two) times daily. 06/25/20  Yes Evans Lance, MD  amiodarone (PACERONE) 200 MG tablet TAKE 1 TABLET (200 MG TOTAL) BY MOUTH DAILY. 07/30/20  07/30/21  Evans Lance, MD  amiodarone (PACERONE) 200 MG tablet TAKE 1 TABLET (200 MG TOTAL) BY MOUTH DAILY. 11/15/19 11/14/20  Evans Lance, MD  bisoprolol (ZEBETA) 10 MG tablet TAKE 1 TABLET (10 MG TOTAL) BY MOUTH DAILY. 11/15/19 11/14/20  Evans Lance, MD  oxyCODONE (OXY IR/ROXICODONE) 5 MG immediate release tablet Take 1-2 tablets (5-10 mg total) by mouth every 6 (six) hours as needed for severe pain. Patient not taking: Reported on 09/10/2020 08/22/20   [name], MD     Vital Signs: BP (!) 99/45   Pulse 89   Temp 98.5 F (36.9 C) (Oral)   Resp (!) 23   Ht 6\' 2"  (1.88 m)   Wt 227 lb 1.2 oz (103 kg)   SpO2 95%   BMI 29.15 kg/m   Physical Exam Vitals and nursing note reviewed.   NAD, alert, resting comfortably on trach Abdomen: Left lateral abdominal drain site without erythema, drainage, or active bleeding; small amount of dark, bloody output. 5 mL out per documentation, another 10 mL or so in bulb. Flushes easily, slow to aspirate.  Imaging: CT ABDOMEN PELVIS WO CONTRAST  Result Date: 10/21/2020 CLINICAL DATA:  History neuroendocrine tumor, post Whipple with intraoperative SMV laceration, ultimately with complete resection of the small bowel, now with postoperative abdominal pain and leukocytosis. EXAM: CT ABDOMEN AND PELVIS WITHOUT CONTRAST TECHNIQUE: Multidetector CT imaging of the abdomen and pelvis was performed following the standard protocol without IV contrast. COMPARISON:  CT the chest, abdomen pelvis-10/15/2020 FINDINGS: Lower chest: Limited visualization  of the lower thorax demonstrates improved aeration of the lung bases with persistent consolidative opacities within the right lower lobe with associated air bronchograms. Ill-defined somewhat nodular tree-in-bud opacities are seen within the left lower lobe though improved compared to the 10/15/2020 examination. No new focal airspace opacities. No pleural effusion. Normal heart size. No pericardial effusion. Pacer lead  terminates within the right ventricular apex. No pericardial effusion. Hepatobiliary: Normal hepatic contour. Redemonstrated approximately 7.6 x 6.0 x 2.6 cm fluid collection about the subcapsular aspect of the posterior segment of the right lobe of the liver (coronal image 58, series 6; axial image 31, series 3). Post cholecystectomy. Pancreas: Redemonstrated pancreatic drain within the remaining portions of the pancreas. A minimal amount of ill-defined fluid surrounds the pancreas though without definable/drainable fluid collection on this noncontrast examination. Spleen: Redemonstrated borderline splenomegaly with the spleen measuring 15 cm in length (image 22, series 3). Adrenals/Urinary Tract: Redemonstrated apparent asymmetric atrophy of the right kidney in comparison to the left. No renal stones. No urine obstruction. Normal noncontrast appearance the bilateral adrenal glands. Normal noncontrast appearance of the urinary bladder given degree of distention. Stomach/Bowel: Sequela Whipple and gastrojejunostomy with absence of the entirety the of small-bowel and ascending colon. Stable positioning balloon retention gastrostomy tube and multiple surgical drainage catheters. No new definable/drainable fluid collection within the abdomen or pelvis. Redemonstrated fluid collection with the pelvic cul-de-sac measuring approximately 4.9 x 4.9 cm (image 81, series 3) grossly unchanged compared to the 10/15/2020 examination, previously, 5.4 x 5.2 cm. Ill-defined fluid collection/hematoma within the left side of the root of the abdominal mesentery is unchanged measuring approximately 11.0 x 10.1 x 3.4 cm (coronal image 39, series 6; axial image 49, series 3), previously, 10.7 x 10.3 x 3.7 cm Separate ill-defined serpiginous fluid collection within the ventral aspect of the abdominal mesentery is also unchanged measuring approximately 4.0 x 3.2 x 2.2 cm (axial image 48, series 3; coronal image 25, series 6).  Vascular/Lymphatic: Normal caliber the abdominal aorta. No bulky retroperitoneal, mesenteric, pelvic or inguinal lymphadenopathy on this noncontrast examination. Reproductive: Normal appearance the prostate gland. Other: Open wound within the ventral aspect of the right mid abdomen. Nodular areas of subcutaneous stranding within the left ventral abdominal wall likely at the location of subcutaneous medication administration. Mild diffuse body wall anasarca. Musculoskeletal: No acute or aggressive osseous abnormalities. IMPRESSION: 1. Stable sequela of Whipple, total small bowel resection and subtotal colectomy with stable positioning of multiple surgical drains, pancreatic duct catheter and balloon retention gastrostomy tube. 2. No new definable/drainable fluid collections within the abdomen or pelvis. 3. Unchanged ill-defined fluid collections within the left side of the root of the abdominal mesentery, the ventral aspect of the abdominal mesentery, the subcapsular aspect of the right lobe of the liver and the pelvic cul-de-sac all of which are incompletely evaluated on this noncontrast examination though may represent areas of evolving hematomas giving imaging stability for the past week. 4. Redemonstrated ill-defined stranding surrounding the remaining portions of the pancreas without definable/drainable fluid collection on this noncontrast examination. 5. Improved aeration of the lung bases with persistent consolidative opacities within the right lower lobe, nonspecific though could be seen in the setting of aspiration. Electronically Signed   By: [name] M.D.   On: 10/21/2020 10:28   DG CHEST PORT 1 VIEW  Result Date: 10/20/2020 CLINICAL DATA:  Respiratory failure. EXAM: PORTABLE CHEST 1 VIEW COMPARISON:  October 18, 2020. FINDINGS: The heart size and mediastinal contours are within normal limits. No  pneumothorax or pleural effusion is noted. Tracheostomy tube is in good position. Left-sided pacemaker is  unchanged. Right internal jugular catheter is unchanged. Mild right basilar subsegmental atelectasis or infiltrate is noted. The visualized skeletal structures are unremarkable. IMPRESSION: Mild right basilar subsegmental atelectasis or infiltrate. Stable support apparatus. Electronically Signed   By: Marijo Conception M.D.   On: 10/20/2020 08:16   CT IMAGE GUIDED DRAINAGE BY PERCUTANEOUS CATHETER  Result Date: 10/21/2020 INDICATION: History of pancreatic neuroendocrine tumor, post Whipple with intraoperative sn the laceration ultimately requiring complete resection of the small bowel found to have a persistent fluid collection within the root of the abdominal mesentery. Given persistent fevers, request made for CT-guided aspiration and/or drainage catheter placement for infection source control purposes. EXAM: CT IMAGE GUIDED DRAINAGE BY PERCUTANEOUS CATHETER COMPARISON:  CT abdomen pelvis-earlier same day; 10/15/2020 MEDICATIONS: The patient is currently admitted to the hospital and receiving intravenous antibiotics. The antibiotics were administered within an appropriate time frame prior to the initiation of the procedure. ANESTHESIA/SEDATION: Moderate (conscious) sedation was employed during this procedure. A total of Versed 0.5 mg and Fentanyl 25 mcg was administered intravenously. Moderate Sedation Time: 15 minutes. The patient's level of consciousness and vital signs were monitored continuously by radiology nursing throughout the procedure under my direct supervision. CONTRAST:  None COMPLICATIONS: None immediate. PROCEDURE: Informed written consent was obtained from the patient's family after a discussion of the risks, benefits and alternatives to treatment. The patient was placed supine on the CT gantry and a pre procedural CT was performed re-demonstrating the known abscess/fluid collection within the root of the abdominal mesentery with dominant component measuring approximately 9.7 x 3.5 cm (image 15,  series 3). The procedure was planned. A timeout was performed prior to the initiation of the procedure. The skin overlying the left lateral abdomen was prepped and draped in the usual sterile fashion. The overlying soft tissues were anesthetized with 1% lidocaine with epinephrine. Appropriate trajectory was planned with the use of a 22 gauge spinal needle. An 18 gauge trocar needle was advanced into the abscess/fluid collection and a short Amplatz super stiff wire was coiled within the collection. Appropriate positioning was confirmed with a limited CT scan. The tract was serially dilated allowing placement of a 10 [place] all-purpose drainage catheter. Appropriate positioning was confirmed with a limited postprocedural CT scan. Despite appropriate drainage catheter positioning, only approximately 2 cc of thick bloody fluid was able to be aspirated. As such, several rounds of attempted lavage aspiration for attempted however failed to yield any additional fluid. The tube was connected to a JP bulb and sutured in place. A dressing was placed. The patient tolerated the procedure well without immediate post procedural complication. IMPRESSION: Successful CT guided placement of a 10 French all purpose drain catheter into the postoperative hematoma within the root of the abdominal mesentery with aspiration of only 2 mL of thick bloody fluid. Samples were sent to the laboratory as requested by the ordering clinical team. PLAN: As this fluid collection appears to represent a hematoma, if the drainage catheter fails to provide any significant yield within the next 1-2 days would have low hesitation to remove the drainage catheter at the patient's bedside. Above was discussed with providing surgeon, Dr. Zenia Resides, the time of procedure completion. Electronically Signed   By: [name] M.D.   On: 10/21/2020 16:58    Labs:  CBC: Recent Labs    10/20/20 0515 10/21/20 0511 10/22/20 0607 10/23/20 0552  WBC 13.1* 19.2*  17.6* 15.2*  HGB 9.7* 10.3* 9.6* 9.3*  HCT 30.0* 31.2* 31.0* 28.7*  PLT 331 411* 361 334    COAGS: Recent Labs    09/19/2020 1228 09/09/2020 1947 09/24/20 0400 09/24/20 1650 10/09/2020 1909 09/11/2020 1624 10/21/20 1418  INR 1.5* 0.9   < > 1.4* 1.4* 1.4* 1.9*  APTT 37* 35  --  32 39*  --   --    < > = values in this interval not displayed.    BMP: Recent Labs    12/03/19 1703 05/24/20 1956 07/03/20 1643 07/21/20 0921 08/21/20 1037 10/21/20 1756 10/22/20 0607 10/22/20 1634 10/23/20 0552  NA 144   < > 142 142   < > 134* 134* 135 134*  K 4.6   < > 3.7 4.2   < > 3.5 3.1* 3.0* 3.0*  CL 109*   < > 103 104   < > 97* 96* 95* 93*  CO2 21   < > 25 27   < > 23 25 27 29   GLUCOSE 103*   < > 105* 83   < > 194* 206* 189* 205*  BUN 15   < > 18 16   < > 89* 112* 129* 74*  CALCIUM 9.5   < > 9.1 9.5   < > 8.6* 8.6* 8.5* 8.4*  CREATININE 1.27   < > 0.96 1.19   < > 3.46* 4.06* 4.35* 3.32*  GFRNONAA 66   < > 92 71   < > 21* 17* 16* 22*  GFRAA 77  --  107 82  --   --   --   --   --    < > = values in this interval not displayed.    LIVER FUNCTION TESTS: Recent Labs    10/20/20 0515 10/20/20 1643 10/21/20 0511 10/21/20 1756 10/22/20 0607 10/22/20 1634 10/23/20 0552  BILITOT 3.9*  --  4.0*  --  4.2*  --  4.4*  AST 98*  --  96*  --  86*  --  103*  ALT 94*  --  98*  --  96*  --  105*  ALKPHOS 115  --  124  --  105  --  98  PROT 7.8  --  7.8  --  7.7  --  7.5  ALBUMIN 1.5*   < > 1.7* 1.6* 1.6* 1.6* 1.5*   < > = values in this interval not displayed.    Assessment and Plan: Neuroendocrine tumor of pancreass/p Whipple procedure with hemicolectomy in OR 09/20/2020 by Dr. Zenia Resides; complicated by hemorrhagic shock with DIC and subsequent diffuse small bowel necrosis s/p small bowel resection 1/96/2229; further complication by intra-abdominal fluid collection s/p left lateral abdominal drain placement in IR 10/21/2020. Left lateral abdominal drain stable with small amount of daily output. WBC  improved to 15.2 Culture did grow abdundant Kelbsiella. On zosyn. Continue current management for now.    IR to follow.   Electronically Signed: Docia Barrier, PA 10/23/2020, 3:56 PM   I spent a total of 15 Minutes at the the patient's bedside AND on the patient's hospital floor or unit, greater than 50% of which was counseling/coordinating care for intra-abdominal fluid collection.

## 2020-10-24 DIAGNOSIS — D3A8 Other benign neuroendocrine tumors: Secondary | ICD-10-CM | POA: Diagnosis not present

## 2020-10-24 LAB — CBC
HCT: 29.8 % — ABNORMAL LOW (ref 39.0–52.0)
Hemoglobin: 9.7 g/dL — ABNORMAL LOW (ref 13.0–17.0)
MCH: 28.2 pg (ref 26.0–34.0)
MCHC: 32.6 g/dL (ref 30.0–36.0)
MCV: 86.6 fL (ref 80.0–100.0)
Platelets: 351 10*3/uL (ref 150–400)
RBC: 3.44 MIL/uL — ABNORMAL LOW (ref 4.22–5.81)
RDW: 17.3 % — ABNORMAL HIGH (ref 11.5–15.5)
WBC: 15.3 10*3/uL — ABNORMAL HIGH (ref 4.0–10.5)
nRBC: 0.3 % — ABNORMAL HIGH (ref 0.0–0.2)

## 2020-10-24 LAB — RENAL FUNCTION PANEL
Albumin: 1.6 g/dL — ABNORMAL LOW (ref 3.5–5.0)
Albumin: 1.4 g/dL — ABNORMAL LOW (ref 3.5–5.0)
Anion gap: 9 (ref 5–15)
Anion gap: 11 (ref 5–15)
BUN: 26 mg/dL — ABNORMAL HIGH (ref 6–20)
BUN: 61 mg/dL — ABNORMAL HIGH (ref 6–20)
CO2: 29 mmol/L (ref 22–32)
CO2: 27 mmol/L (ref 22–32)
Calcium: 8.4 mg/dL — ABNORMAL LOW (ref 8.9–10.3)
Calcium: 8 mg/dL — ABNORMAL LOW (ref 8.9–10.3)
Chloride: 96 mmol/L — ABNORMAL LOW (ref 98–111)
Chloride: 97 mmol/L — ABNORMAL LOW (ref 98–111)
Creatinine, Ser: 1.59 mg/dL — ABNORMAL HIGH (ref 0.61–1.24)
Creatinine, Ser: 3.55 mg/dL — ABNORMAL HIGH (ref 0.61–1.24)
GFR, Estimated: 53 mL/min — ABNORMAL LOW
GFR, Estimated: 20 mL/min — ABNORMAL LOW (ref >60)
Glucose, Bld: 137 mg/dL — ABNORMAL HIGH (ref 70–99)
Glucose, Bld: 167 mg/dL — ABNORMAL HIGH (ref 70–99)
Phosphorus: 1.3 mg/dL — ABNORMAL LOW (ref 2.5–4.6)
Phosphorus: 5 mg/dL — ABNORMAL HIGH (ref 2.5–4.6)
Potassium: 3 mmol/L — ABNORMAL LOW (ref 3.5–5.1)
Potassium: 3.9 mmol/L (ref 3.5–5.1)
Sodium: 134 mmol/L — ABNORMAL LOW (ref 135–145)
Sodium: 135 mmol/L (ref 135–145)

## 2020-10-24 LAB — GLUCOSE, CAPILLARY
Glucose-Capillary: 130 mg/dL — ABNORMAL HIGH (ref 70–99)
Glucose-Capillary: 173 mg/dL — ABNORMAL HIGH (ref 70–99)
Glucose-Capillary: 151 mg/dL — ABNORMAL HIGH (ref 70–99)
Glucose-Capillary: 170 mg/dL — ABNORMAL HIGH (ref 70–99)
Glucose-Capillary: 150 mg/dL — ABNORMAL HIGH (ref 70–99)

## 2020-10-24 LAB — MAGNESIUM: Magnesium: 2 mg/dL (ref 1.7–2.4)

## 2020-10-24 MED ORDER — POTASSIUM CHLORIDE 10 MEQ/50ML IV SOLN
10.0000 meq | INTRAVENOUS | Status: AC
  Administered 2020-10-24 (×3): 10 meq via INTRAVENOUS
  Filled 2020-10-24 (×3): qty 50

## 2020-10-24 MED ORDER — POTASSIUM PHOSPHATES 15 MMOLE/5ML IV SOLN
30.0000 mmol | Freq: Once | INTRAVENOUS | Status: AC
  Administered 2020-10-24: 30 mmol via INTRAVENOUS
  Filled 2020-10-24: qty 10

## 2020-10-24 MED ORDER — ZINC CHLORIDE 1 MG/ML IV SOLN
INTRAVENOUS | Status: AC
  Filled 2020-10-24: qty 1246.4

## 2020-10-24 NOTE — Progress Notes (Signed)
PHARMACY - TOTAL PARENTERAL NUTRITION CONSULT NOTE  Indication:  Short bowel syndrome  Patient Measurements: Height: 6\' 2"  (188 cm) Weight: 124.9 kg (275 lb 5.7 oz) IBW/kg (Calculated) : 82.2 TPN AdjBW (KG): 95.3 Body mass index is 35.35 kg/m.  Assessment:  29 YOM with history of mass adjacent to head of the pancreas in 2021. 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  Developed cholecystitis in Feb 2022 and had percutaneous cholecystectomy tube placement.  Presented this admission for neuroendocrine tumor resection with Whipple procedure, also had right total colectomy, colostomy and cholecystectomy on 10/08/2020. SMV was lacerated during procedure and repaired by bovine pericardial patch. Developed hemorrhagic shock with bile leak post-op and underwent placement of biliary T tube with abdominal washout on 3/15 PM. Abdomen remains open. Pharmacy consulted to manage TPN.  Glucose / Insulin: no hx DM, A1c 5.2%. CBGs mostly controlled. Utilized 26 units SSI in the past 24h hours, 60 units in TPN, Novolog 2 units Q4H (hold for CBG < 150).   Electrolytes: low Na/CL, K down to 3 (3 runs per Renal), CoCa high normal at 10.3 (iCa 1.16 low normal on 4/7), Phos 1.3 (Renal replacing), others WNL Renal: CRRT started 4/9 > 4/11, tolerated first and second iHD on 4/13 and 4/15, next HD 4/15 (4K baths), BUN down to 26 Hepatic: LFTs mildly elevated, tbili 4.4 (scleral yellowing still noted per RN 4/3) Albumin 1.6, prealbumin 22.8 (4/11) TG peaked at 740, now at 328 on 4/14 - Intralipids 100g/wk and monitor TG trends Intake / Output; MIVF: UOP 0.1 ml/kg/hr, drains (5 total): 1482L, 0cc from gastrostomy, net -5L  GI Imaging:  4/6 CT - pelvic fluid collection concerning for abscess 4/12 CT - no new fluid collections GI Surgeries / Procedures:  3/14 Whipple procedure with R hemicolectomy and end ileostomy, cholecystectomy 3/15 washout, VAC placement 3/17 washout, VAC replacement.   Necrosis of entire small bowel except for the PB limb and proximal 40-50cm of jejunum 3/19 re-opening laparotomy, abd closure.  Necrotic SB. 3/21 SB resection, take down of choledochojejunal anastomosis and pancreaticojejunal anastomosis, take down of duodenojejunal anastomosis, placement of externalized biliary drain / Stamm gastrostomy tube, IA washout and placement of intraperitoneal drains 3/30 extubation 4/8 trach  Central access: R IJ CVC placed 3/15; changed to PICC placed 10/03/20 TPN start date: 10/02/2020  Nutritional Goals (RD rec on 4/14): 2800-3000 kCal, 185-210g AA, fluid >2L/day  Current Nutrition:  TPN  Plan:  - Continue concentrated TPN at 95 ml/hr - Intralipid 20% 211mL Mon/Thurs for now to prevent EFAD - TPN with Intralipid 2x/wk provides a daily average of 187g AA and 2869 kCal, meeting 100% of patient needs.  - Electrolytes in TPN: increase Na 12mEq/L on 4/14, increase K 18mEq/L on 4/14, reduce Ca 69mEq/L on 4/14, Mag 51mEq/L, add Phos 41mEq/L on 4/15, cont Cl:Ac 1:2 - Daily multivitamin in TPN.  Remove standard trace elements and add back zinc 5mg , and selenium 15mcg.  Remove chromium while on RRT. - Continue resistant SSI Q4H, continue 60 units regular insulin in TPN per DM coordinator recommendation, Novolog 2 units SQ Q6H. - Renal function BID  Thank you for involving pharmacy in this patient's care.  Renold Genta, PharmD, BCPS Clinical Pharmacist Clinical phone for 10/24/2020 until 3p is [phone] 10/24/2020 7:16 AM  **Pharmacist phone directory can be found on [email or web address] listed under [place]**

## 2020-10-24 NOTE — Progress Notes (Signed)
Pharmacy Antibiotic Note  [name] is a 50 y.o. male admitted on 09/26/2020 with pneumonia and intra-abdominal infection.  Pharmacy has been consulted for Zosyn dosing. Of note CRRT was stopped yesterday and now planning to transition to iHD - tolerated on 4/13 and 4/14.   Given worsening leukocytosis and persistent intra-abdominal collections on CT abdomen, MD wants to continue antibiotics for now. Also adding anidulafungin to cover for fungal infection.  He is currently afebrile and WBC overall down to 15.3. K pneumo in abscess sensitive to Zosyn (day 10).  Plan: Continue Zosyn 2.25 gm IV Q 8 hours Anidulafungin per MD   Height: 6\' 2"  (188 cm) Weight: 92.9 kg (204 lb 12.9 oz) IBW/kg (Calculated) : 82.2  Temp (24hrs), Avg:98.9 F (37.2 C), Min:98.5 F (36.9 C), Max:99.6 F (37.6 C)  Recent Labs  Lab 10/20/20 0515 10/20/20 1643 10/21/20 0511 10/21/20 1756 10/22/20 0607 10/22/20 1634 10/23/20 0552 10/23/20 1445 10/24/20 0456 10/24/20 0457  WBC 13.1*  --  19.2*  --  17.6*  --  15.2*  --   --  15.3*  CREATININE 1.61*   < > 2.67*   < > 4.06* 4.35* 3.32* 4.12* 1.59*  --    < > = values in this interval not displayed.    Estimated Creatinine Clearance: 65.3 mL/min (A) (by C-G formula based on SCr of 1.59 mg/dL (H)).    Allergies  Allergen Reactions  . Ace Inhibitors Cough    Antimicrobials this admission: Vanc 3/18 >> 3/21 CTX 3/17 >> 3/21 Flagyl 3/17 >> 3/21 Zosyn 3/21 >> 3/30, restarted 4/5 >> Eraxis 3/22 >> 3/29, restarted 4/12 >>  Dose adjustments this admission: 3/19 VR 9 - 1750 mg q24  Microbiology results: 3/14 MRSA PCR - negative 3/23 BCx - negF 4/5 TA >> rare klebsiella   4/12 BCx - NGTD 4/12 Abscess - Kleb pneumo, holding for anaerobes  Thank you for involving pharmacy in this patient's care.  Renold Genta, PharmD, BCPS Clinical Pharmacist Clinical phone for 10/24/2020 until 3p is [phone] 10/24/2020 11:46 AM  **Pharmacist phone directory  can be found on [email or web address] listed under [place]**

## 2020-10-24 NOTE — Progress Notes (Signed)
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 Zenia Resides; complicated by hemorrhagic shockwith DIC and subsequent diffuse small bowel necrosis s/p small bowel resection 0/81/4481; further complication by intra-abdominal fluid collection s/p left lateral abdominal drain placement in IR 10/21/2020. Patient laying in bed resting comfortably, tracheostomy in place. He opens eyes to voice and nods to yes/no questions appropriately. Wife and male family member at bedside. Left lateral abdominal drain site c/d/i.   Allergies: Ace inhibitors  Medications: Prior to Admission medications   Medication Sig Start Date End Date Taking? Authorizing Provider  acetaminophen (TYLENOL) 500 MG tablet Take 2 tablets (1,000 mg total) by mouth every 6 (six) hours. Patient taking differently: Take 1,000 mg by mouth every 6 (six) hours as needed for mild pain. 08/22/20  Yes [name], MD  allopurinol (ZYLOPRIM) 100 MG tablet Take one tablet by mouth daily for 1 week and then 2 tablets by mouth daily Patient taking differently: Take 200 mg by mouth daily. 07/03/20  Yes Swords, Darrick Penna, MD  amiodarone (PACERONE) 200 MG tablet Take 1 tablet (200 mg total) by mouth daily. Patient taking differently: Take 200 mg by mouth daily with supper. 07/30/20  Yes Evans Lance, MD  aspirin 81 MG tablet Take 1 tablet (81 mg total) by mouth 2 (two) times daily. Patient taking differently: Take 81 mg by mouth daily. 09/22/15  Yes Funches, Josalyn, MD  bisoprolol (ZEBETA) 10 MG tablet Take 1 tablet by mouth once daily Patient taking differently: Take 10 mg by mouth daily. 08/25/20  Yes Evans Lance, MD  budesonide-formoterol [facility]) 80-4.5 MCG/ACT inhaler Inhale 2 puffs into the lungs 2 (two)  times daily. Must keep upcoming office visit for refills Patient taking differently: Inhale 2 puffs into the lungs 2 (two) times daily as needed (for flares). 11/09/19  Yes Fulp, Cammie, MD  sacubitril-valsartan (ENTRESTO) 24-26 MG Take 1 tablet by mouth 2 (two) times daily. 06/25/20  Yes Evans Lance, MD  amiodarone (PACERONE) 200 MG tablet TAKE 1 TABLET (200 MG TOTAL) BY MOUTH DAILY. 07/30/20 07/30/21  Evans Lance, MD  amiodarone (PACERONE) 200 MG tablet TAKE 1 TABLET (200 MG TOTAL) BY MOUTH DAILY. 11/15/19 11/14/20  Evans Lance, MD  bisoprolol (ZEBETA) 10 MG tablet TAKE 1 TABLET (10 MG TOTAL) BY MOUTH DAILY. 11/15/19 11/14/20  Evans Lance, MD  oxyCODONE (OXY IR/ROXICODONE) 5 MG immediate release tablet Take 1-2 tablets (5-10 mg total) by mouth every 6 (six) hours as needed for severe pain. Patient not taking: Reported on 09/10/2020 08/22/20   [name], MD     Vital Signs: BP 101/60 (BP Location: Left Arm)   Pulse 92   Temp 99.7 F (37.6 C) (Oral)   Resp (!) 23   Ht 6' 2"  (1.88 m)   Wt 204 lb 12.9 oz (92.9 kg)   SpO2 95%   BMI 26.30 kg/m   Physical Exam Vitals and nursing note reviewed.  Constitutional:      General: He is not in acute distress.    Comments: Tracheostomy.   Pulmonary:     Effort: Pulmonary effort is normal. No respiratory distress.     Comments: Tracheostomy.  Abdominal:     Comments: (+) midline wound, woundvac in place. (+) three right surgical drains, one left surgical  drain, gastrostomy tube to gravity.  Left lateral abdominal drain site without erythema, drainage, or active bleeding; approximately 25 cc output of thick amber fluid in suction bulb; drain flushes easily but resistance is met with aspiration.   Skin:    General: Skin is warm and dry.  Neurological:     Mental Status: He is alert.     Comments: Tracheostomy. Nods appropriately to yes/no questions.       Imaging: CT ABDOMEN PELVIS WO CONTRAST  Result Date: 10/21/2020 CLINICAL  DATA:  History neuroendocrine tumor, post Whipple with intraoperative SMV laceration, ultimately with complete resection of the small bowel, now with postoperative abdominal pain and leukocytosis. EXAM: CT ABDOMEN AND PELVIS WITHOUT CONTRAST TECHNIQUE: Multidetector CT imaging of the abdomen and pelvis was performed following the standard protocol without IV contrast. COMPARISON:  CT the chest, abdomen pelvis-10/15/2020 FINDINGS: Lower chest: Limited visualization of the lower thorax demonstrates improved aeration of the lung bases with persistent consolidative opacities within the right lower lobe with associated air bronchograms. Ill-defined somewhat nodular tree-in-bud opacities are seen within the left lower lobe though improved compared to the 10/15/2020 examination. No new focal airspace opacities. No pleural effusion. Normal heart size. No pericardial effusion. Pacer lead terminates within the right ventricular apex. No pericardial effusion. Hepatobiliary: Normal hepatic contour. Redemonstrated approximately 7.6 x 6.0 x 2.6 cm fluid collection about the subcapsular aspect of the posterior segment of the right lobe of the liver (coronal image 58, series 6; axial image 31, series 3). Post cholecystectomy. Pancreas: Redemonstrated pancreatic drain within the remaining portions of the pancreas. A minimal amount of ill-defined fluid surrounds the pancreas though without definable/drainable fluid collection on this noncontrast examination. Spleen: Redemonstrated borderline splenomegaly with the spleen measuring 15 cm in length (image 22, series 3). Adrenals/Urinary Tract: Redemonstrated apparent asymmetric atrophy of the right kidney in comparison to the left. No renal stones. No urine obstruction. Normal noncontrast appearance the bilateral adrenal glands. Normal noncontrast appearance of the urinary bladder given degree of distention. Stomach/Bowel: Sequela Whipple and gastrojejunostomy with absence of the  entirety the of small-bowel and ascending colon. Stable positioning balloon retention gastrostomy tube and multiple surgical drainage catheters. No new definable/drainable fluid collection within the abdomen or pelvis. Redemonstrated fluid collection with the pelvic cul-de-sac measuring approximately 4.9 x 4.9 cm (image 81, series 3) grossly unchanged compared to the 10/15/2020 examination, previously, 5.4 x 5.2 cm. Ill-defined fluid collection/hematoma within the left side of the root of the abdominal mesentery is unchanged measuring approximately 11.0 x 10.1 x 3.4 cm (coronal image 39, series 6; axial image 49, series 3), previously, 10.7 x 10.3 x 3.7 cm Separate ill-defined serpiginous fluid collection within the ventral aspect of the abdominal mesentery is also unchanged measuring approximately 4.0 x 3.2 x 2.2 cm (axial image 48, series 3; coronal image 25, series 6). Vascular/Lymphatic: Normal caliber the abdominal aorta. No bulky retroperitoneal, mesenteric, pelvic or inguinal lymphadenopathy on this noncontrast examination. Reproductive: Normal appearance the prostate gland. Other: Open wound within the ventral aspect of the right mid abdomen. Nodular areas of subcutaneous stranding within the left ventral abdominal wall likely at the location of subcutaneous medication administration. Mild diffuse body wall anasarca. Musculoskeletal: No acute or aggressive osseous abnormalities. IMPRESSION: 1. Stable sequela of Whipple, total small bowel resection and subtotal colectomy with stable positioning of multiple surgical drains, pancreatic duct catheter and balloon retention gastrostomy tube. 2. No new definable/drainable fluid collections within the abdomen or pelvis. 3. Unchanged ill-defined fluid collections within the  left side of the root of the abdominal mesentery, the ventral aspect of the abdominal mesentery, the subcapsular aspect of the right lobe of the liver and the pelvic cul-de-sac all of which are  incompletely evaluated on this noncontrast examination though may represent areas of evolving hematomas giving imaging stability for the past week. 4. Redemonstrated ill-defined stranding surrounding the remaining portions of the pancreas without definable/drainable fluid collection on this noncontrast examination. 5. Improved aeration of the lung bases with persistent consolidative opacities within the right lower lobe, nonspecific though could be seen in the setting of aspiration. Electronically Signed   By: [name] M.D.   On: 10/21/2020 10:28   CT IMAGE GUIDED DRAINAGE BY PERCUTANEOUS CATHETER  Result Date: 10/21/2020 INDICATION: History of pancreatic neuroendocrine tumor, post Whipple with intraoperative sn the laceration ultimately requiring complete resection of the small bowel found to have a persistent fluid collection within the root of the abdominal mesentery. Given persistent fevers, request made for CT-guided aspiration and/or drainage catheter placement for infection source control purposes. EXAM: CT IMAGE GUIDED DRAINAGE BY PERCUTANEOUS CATHETER COMPARISON:  CT abdomen pelvis-earlier same day; 10/15/2020 MEDICATIONS: The patient is currently admitted to the hospital and receiving intravenous antibiotics. The antibiotics were administered within an appropriate time frame prior to the initiation of the procedure. ANESTHESIA/SEDATION: Moderate (conscious) sedation was employed during this procedure. A total of Versed 0.5 mg and Fentanyl 25 mcg was administered intravenously. Moderate Sedation Time: 15 minutes. The patient's level of consciousness and vital signs were monitored continuously by radiology nursing throughout the procedure under my direct supervision. CONTRAST:  None COMPLICATIONS: None immediate. PROCEDURE: Informed written consent was obtained from the patient's family after a discussion of the risks, benefits and alternatives to treatment. The patient was placed supine on the CT  gantry and a pre procedural CT was performed re-demonstrating the known abscess/fluid collection within the root of the abdominal mesentery with dominant component measuring approximately 9.7 x 3.5 cm (image 15, series 3). The procedure was planned. A timeout was performed prior to the initiation of the procedure. The skin overlying the left lateral abdomen was prepped and draped in the usual sterile fashion. The overlying soft tissues were anesthetized with 1% lidocaine with epinephrine. Appropriate trajectory was planned with the use of a 22 gauge spinal needle. An 18 gauge trocar needle was advanced into the abscess/fluid collection and a short Amplatz super stiff wire was coiled within the collection. Appropriate positioning was confirmed with a limited CT scan. The tract was serially dilated allowing placement of a 10 [place] all-purpose drainage catheter. Appropriate positioning was confirmed with a limited postprocedural CT scan. Despite appropriate drainage catheter positioning, only approximately 2 cc of thick bloody fluid was able to be aspirated. As such, several rounds of attempted lavage aspiration for attempted however failed to yield any additional fluid. The tube was connected to a JP bulb and sutured in place. A dressing was placed. The patient tolerated the procedure well without immediate post procedural complication. IMPRESSION: Successful CT guided placement of a 10 French all purpose drain catheter into the postoperative hematoma within the root of the abdominal mesentery with aspiration of only 2 mL of thick bloody fluid. Samples were sent to the laboratory as requested by the ordering clinical team. PLAN: As this fluid collection appears to represent a hematoma, if the drainage catheter fails to provide any significant yield within the next 1-2 days would have low hesitation to remove the drainage catheter at the patient's bedside. Above  was discussed with providing surgeon, Dr. Zenia Resides, the  time of procedure completion. Electronically Signed   By: [name] M.D.   On: 10/21/2020 16:58    Labs:  CBC: Recent Labs    10/21/20 0511 10/22/20 0607 10/23/20 0552 10/24/20 0457  WBC 19.2* 17.6* 15.2* 15.3*  HGB 10.3* 9.6* 9.3* 9.7*  HCT 31.2* 31.0* 28.7* 29.8*  PLT 411* 361 334 351    COAGS: Recent Labs    09/11/2020 1228 09/21/2020 1947 09/24/20 0400 09/24/20 1650 09/17/2020 1909 09/16/2020 1624 10/21/20 1418  INR 1.5* 0.9   < > 1.4* 1.4* 1.4* 1.9*  APTT 37* 35  --  32 39*  --   --    < > = values in this interval not displayed.    BMP: Recent Labs    12/03/19 1703 05/24/20 1956 07/03/20 1643 07/21/20 0921 08/21/20 1037 10/22/20 1634 10/23/20 0552 10/23/20 1445 10/24/20 0456  NA 144   < > 142 142   < > 135 134* 135 134*  K 4.6   < > 3.7 4.2   < > 3.0* 3.0* 3.3* 3.0*  CL 109*   < > 103 104   < > 95* 93* 93* 96*  CO2 21   < > 25 27   < > 27 29 27 29   GLUCOSE 103*   < > 105* 83   < > 189* 205* 144* 137*  BUN 15   < > 18 16   < > 129* 74* 93* 26*  CALCIUM 9.5   < > 9.1 9.5   < > 8.5* 8.4* 8.5* 8.4*  CREATININE 1.27   < > 0.96 1.19   < > 4.35* 3.32* 4.12* 1.59*  GFRNONAA 66   < > 92 71   < > 16* 22* 17* 53*  GFRAA 77  --  107 82  --   --   --   --   --    < > = values in this interval not displayed.    LIVER FUNCTION TESTS: Recent Labs    10/20/20 0515 10/20/20 1643 10/21/20 0511 10/21/20 1756 10/22/20 0607 10/22/20 1634 10/23/20 0552 10/23/20 1445 10/24/20 0456  BILITOT 3.9*  --  4.0*  --  4.2*  --  4.4*  --   --   AST 98*  --  96*  --  86*  --  103*  --   --   ALT 94*  --  98*  --  96*  --  105*  --   --   ALKPHOS 115  --  124  --  105  --  98  --   --   PROT 7.8  --  7.8  --  7.7  --  7.5  --   --   ALBUMIN 1.5*   < > 1.7*   < > 1.6* 1.6* 1.5* 1.5* 1.6*   < > = values in this interval not displayed.    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 Zenia Resides; complicated  by hemorrhagic shockwith DIC and subsequent diffuse small bowel necrosis s/p small bowel resection 11/17/3265; further complication by intra-abdominal fluid collection s/p left lateral abdominal drain placement in IR 10/21/2020. Left lateral abdominal drain stable with approximately 25 cc output thick amber fluid in suction bulb (additional 87 cc output from drain in past 24 hours per chart). Continue current drain management- continue with Qshift flushes/monitor of output. Plan for  repeat CT/possible drain injection when output <10 cc/day (assess for possible removal). Further plans per CCS/CCM- appreciate and agree with management. IR to follow.   Electronically Signed: Earley Abide, PA-C 10/24/2020, 2:30 PM   I spent a total of 15 Minutes at the the patient's bedside AND on the patient's hospital floor or unit, greater than 50% of which was counseling/coordinating care for intra-abdominal fluid collection s/p left lateral abdominal drain placement.

## 2020-10-24 NOTE — Progress Notes (Signed)
7 Days Post-Op  Subjective: Afebrile, remains stable off pressors. WBC stable at 15. Tolerated HD yesterday. Worked with PT. Has been on trach collar all night.   Objective: Vital signs in last 24 hours: Temp:  [98.5 F (36.9 C)-99.6 F (37.6 C)] 98.6 F (37 C) (04/15 0400) Pulse Rate:  [66-99] 93 (04/15 0700) Resp:  [17-30] 23 (04/15 0700) BP: (89-123)/(45-77) 110/69 (04/15 0700) SpO2:  [88 %-100 %] 98 % (04/15 0700) FiO2 (%):  [28 %] 28 % (04/15 0310) Weight:  [92.9 kg] 92.9 kg (04/15 0345) Last BM Date:  (pta)  Intake/Output from previous day: 04/14 0701 - 04/15 0700 In: 2813.1 [I.V.:2409.9; IV Piggyback:398.2] Out: 2671 [Urine:325; Drains:1482] Intake/Output this shift: No intake/output data recorded.  PE: General: no acute distress Neuro: alert, follows commands  HEENT: trach in place, site clean and dry. Resp: normal work of breathing, on trach collar CV: RRR Abdomen: soft, nondistended, wound vac in place on midline incision. RLQ ostomy site is clean and granulating. JPx2 with milky brown bile-tinged fluid. Biliary drain with bile. LUQ pancreatic stent with clear colorless fluid. G tube in place to gravity LUQ. LUQ perc drain with scant old blood.   Lab Results:  Recent Labs    10/23/20 0552 10/24/20 0457  WBC 15.2* 15.3*  HGB 9.3* 9.7*  HCT 28.7* 29.8*  PLT 334 351   BMET Recent Labs    10/23/20 1445 10/24/20 0456  NA 135 134*  K 3.3* 3.0*  CL 93* 96*  CO2 27 29  GLUCOSE 144* 137*  BUN 93* 26*  CREATININE 4.12* 1.59*  CALCIUM 8.5* 8.4*   PT/INR Recent Labs    10/21/20 1418  LABPROT 22.1*  INR 1.9*   CMP     Component Value Date/Time   NA 134 (L) 10/24/2020 0456   NA 142 07/21/2020 0921   K 3.0 (L) 10/24/2020 0456   CL 96 (L) 10/24/2020 0456   CO2 29 10/24/2020 0456   GLUCOSE 137 (H) 10/24/2020 0456   BUN 26 (H) 10/24/2020 0456   BUN 16 07/21/2020 0921   CREATININE 1.59 (H) 10/24/2020 0456   CREATININE 1.16 03/01/2016 1607    CALCIUM 8.4 (L) 10/24/2020 0456   PROT 7.5 10/23/2020 0552   PROT 6.7 07/21/2020 0921   ALBUMIN 1.6 (L) 10/24/2020 0456   ALBUMIN 4.1 07/21/2020 0921   AST 103 (H) 10/23/2020 0552   ALT 105 (H) 10/23/2020 0552   ALKPHOS 98 10/23/2020 0552   BILITOT 4.4 (H) 10/23/2020 0552   BILITOT 0.8 07/21/2020 0921   GFRNONAA 53 (L) 10/24/2020 0456   GFRNONAA >89 09/22/2015 1008   GFRAA 82 07/21/2020 0921   GFRAA >89 09/22/2015 1008   Lipase     Component Value Date/Time   LIPASE 23 08/22/2020 0318       Studies/Results: No results found.  Assessment/Plan 50 yo male 1 month s/p Whipple, right colectomy and SMV repair for T3N0 neuroendocrine tumor. C/b postop hemorrhagic shock with DIC and subsequent diffuse small bowel necrosis, now s/p small bowel resection with external drainage of bile duct, pancreatic duct and stomach. - Continue antibiotics - Blood cultures negative to date - Acute renal failure: intermittent HD, nephrology following - Continue TPN. Will ultimately need tunneled line. - G tube to gravity - Continue trach collar as tolerated - Vac change MWF, due today - Aggressive PT/OT - Minimal output from recently placed perc drain, plan for removal today - VTE: SQH - Dispo: ICU  LOS: 38 days    Michaelle Birks, MD [facility] Surgery General, Hepatobiliary and Pancreatic Surgery 10/24/20 7:27 AM

## 2020-10-24 NOTE — Progress Notes (Signed)
NAMESANFORD Solomon, MRN:  [id number], DOB:  Mar 31, 1971, LOS: 5 ADMISSION DATE:  09/15/2020, CONSULTATION DATE:  10/24/20 REFERRING MD:  Anesthesia, CHIEF COMPLAINT:  Whipple, post-op shock   Brief History:  50 y.o. M with PMH of neuroendocrine tumor and underwent Whipple procedure 3/14 with intra-operative SMV laceration and repair by vascular with bovine pericardial patch.  Pt had worsening shock overnight and required three pressors despite massive transfusion protocol (received 150 blood products).  He was taken back to the OR for washout on 3/15 and returned to the ICU intubated.  Taken back 3/17 again found to have small bowel necrosis s/p resection.  Plans again to return 3/19 to OR.   Totality of small bowel has been resected. The case was discussed with [facility] who may consider eval for sm intestinal transplant pending clinical course.  Continuing aggressive supportive care.   Past Medical History:   has a past medical history of AICD (automatic cardioverter/defibrillator) present, Dyslipidemia, Heart failure with mildly reduced EF, History of kidney stones, HTN (hypertension), ventricular fibrillation, and Nonischemic cardiomyopathy.  Significant Hospital Events:  3/14: Admit to Surgery, to OR for whipple, SMV injury and repair, massive transfusion protocol overnight  3/15: Back to OR for washout, x2. IR for arteriogram. No major bleed source identified in OR, or arterial bleed w IR. Robust product resuscitation >75 products (+ TXA + novoseven) 3/16: off pressors, add'l 39 products overnight. Slowing resuscitation this morning with hemodynamic improvements and slowing drain output; OR x 2 - washout, biliary t tube, wound vac -- washout, wound vac, IR for arteriogram -- no arterial bleed for embolization 3/17:  Aggressive balanced transfusion continue overnight with marked hemodynamic improvement since yesterday evening. Off pressors x several hours, Wound vac output significantly slowing  (from 551ml q15-24min to 517ml q1hr+) and output is much thinner, Thin bloody secretions from mouth approx 274ml, Decreased RR from 22 to 18, Unable to tolerate NE- severe bradycardia 3/19 taken back to OR. Small bowel noted to be almost completely necrosed. Patient was closed with plans for goals of car discussion.  3/20 GOC family endorses full scope of treatment. Primary team discussed with [facility] the possibility of transfer for small bowel transplant. [facility] felt as though transfer would not change outcome. 3/21 Ongoing discussion with family and [facility]. Patient may be a candidate for transplant if he can stabilize post a small bowel resection. He was taken back to the OR and small bowel was resected.  3/23 weaning pressors. Versed off. Remains encephalopathic 3/24 off pressors. Low dose dilaudid gtt -- only weakly grimaces to pain. Changing sedation to precedex + PRN fentanyl. Long discussion with 2 brothers regarding clinical case -- tried to clarify that while the term "stable" has been used, in this instance is meaning that he has not declined from previous shift but is in fact still critically ill with multisystem organ dysfunction.  3/25 Cr and BUN increased. Off sedation  3/26 Pressors off, CT head benign, BUN/Cr continue to creep up 3/27 Tachycardia/HTN overnight improved with fentanyl gtt 2/28 PCCM sign off. Trauma to take over vent/CCM needs.  3/30 extubated 4/2 desaturated overnight to the 30s improved with BVM and NTS.  4/5 poor airway protection. PCCM consulted for re-intubation.  4/6 CT a/p Large RLL opacity, smaller LLL opacity. Post op changes. 5.4cm fluid collection in posterior pelvis, concerning for abscess. Started on Zosyn  4/7 got transfused overnight and then diuresed. Worse renal function 4/9 transfused again. HD line inserted and started on  CRRT 4/10 started on trach collar trials. 4/12 off CRRT and transitioned to IHD. Surgery placed abdominal drain with drainage consistent  with hematoma 4/13, 4/14 Tolerated dialysis  Consults:  PCCM VVS  IR  Palliative  Procedures:  PICC 3/25 > JP x 2  Biliary drain Pancreatic stent drain ETT 2/24>>3/13; 3/15 >> 3/29;  4/5 >  Significant Diagnostic Tests:  See Radiology tab   Most recent significant exam was CT a/p 4/6: which revealed R>L pulmonary opacities, and pelvic fluid collection concerning for abscess as well as detailing the numerous post-op changes.   Micro Data:  See micro tab 4/5 trach asp >> Klebsiella pneumoniae 4/12 Wound >> pan sensitive Klebsiella pneumoniae  Antimicrobials:  Zosyn 3/21 > 3/29; 4/5 >> Eraxis 3/23> 3/29, 4/12>  Interim History / Subjective:   Tolerated dialysis Participating in PT  Objective   Blood pressure 105/65, pulse 87, temperature 98.5 F (36.9 C), temperature source Oral, resp. rate (!) 21, height 6\' 2"  (1.88 m), weight 92.9 kg, SpO2 97 %.    FiO2 (%):  [28 %] 28 %   Intake/Output Summary (Last 24 hours) at 10/24/2020 1221 Last data filed at 10/24/2020 1100 Gross per 24 hour  Intake 3559.35 ml  Output 1932 ml  Net 1627.35 ml   Filed Weights   10/22/20 1630 10/22/20 1958 10/24/20 0345  Weight: 103 kg 103 kg 92.9 kg   Physical Exam: General: Chronically ill-appearing, no acute distress HENT: [place], AT, OP clear, MMM Neck: Trach in place, c/d/i Eyes: EOMI Respiratory: Clear to auscultation bilaterally.  No crackles, wheezing or rales Cardiovascular: RRR, -M/R/G, no JVD GI: Wound vac in plcae, multiple drains in place, Extremities:Muscle wasting, -edema,-tenderness Neuro: AAO x4, CNII-XII grossly intact, follows commands, moves extremities x 4   K 3.0 BUN/Cr 26/1.59  Imaging, labs and test noted above have been reviewed independently by me.   Resolved Hospital Problem list   Hemorrhagic shock hypophosphatemia Septic shock  Assessment & Plan:   Critically ill due to acute hypoxemic respiratory failure requiring mechanical ventilation. due to  generalized weakness and possible aspiration pneumonia - Trach collar as tolerated during the day - Full vent support during the day - Speech with Passy-Muir valve.  - Continue PT  Pancreatic neuroendocrine tumor s/p whipple 3/21 b/c hemorrhagic shock, c/b small bowel necrosis now s/p total small bowel resection and externa; drainage for bile duct pancreatic duct and stomach.  - Per surgery for managment - Continue TPN - Family's continued hope is for transfer to [facility] for small bowel transplant. Suspect pt has quite a ways to go before stable enough to be considered, and am unsure if underlying conditions like NICM would preclude candidacy.   Fever and leukocytosis - improving Afebrile 72 hours. CT abdomen with no new fluid collections. - F/u blood cultures from 4/12  - Continue current Zosyn and Eraxis for further 10 days.    AKI, worse since reintubation - possible hypotensive hit [place] [facility] consult. Planning for iHD .  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   NICM s/p ICD - EF has improved based on recent echocardiogram.  -Well compensated at this time.  - Lack of digestive tract precludes usual GDHFT.  [place] Suspect poor prognosis.  The family hopes for a small intestine transplant --[facility] has suggested he might be candidate but only if we can get him  to the point being rehabilitation ready.     Daily Goals Checklist  Pain/Anxiety/Delirium protocol (if indicated): PRN only for RASS goal 0 VAP protocol (if indicated): bundle in place.   DVT prophylaxis: SCD Nutrition Status: TPN - needs tunneled line. Plan for early next week if fever and leukocytosis improve.  GI prophylaxis: Pantoprazole Urinary catheter: external Central lines: PICC 3/25 Glucose control: -SSI + TPN Mobility/therapy needs: PT Restraints: Restraint  type:NA Reason for restraints:NA Daily labs: CBC, CMP daily Code Status: Partial - -no CV/DF (has an ICD however) Family Communication: per primary Disposition: ICU.  Goals of Care:  Last date of multidisciplinary goals of care discussion: 69/5 Family and staff present: Dr. Zenia Resides Summary of discussion: Family wishes for aggressive care.   Code Status:  Partial - no CV/DF by [place] (has an ICD which is not to be turned off)  The patient is critically ill with multiple organ systems failure and requires high complexity decision making for assessment and support, frequent evaluation and titration of therapies, application of advanced monitoring technologies and extensive interpretation of multiple databases.  Independent Critical Care Time: 35 Minutes.   Rodman Pickle, M.D. [facility] Pulmonary/Critical Care Medicine 10/24/2020 12:21 PM   Please see Amion for pager number to reach on-call Pulmonary and Critical Care Team.

## 2020-10-24 NOTE — Progress Notes (Signed)
Per PCCM, verbal order given to RRT to have pt placed on mechanical ventilation tonight to help prevent fatigue. Order's placed.

## 2020-10-24 NOTE — Progress Notes (Signed)
[facility] Progress Note     Assessment/ Plan:   1.Acute renal failure -likely has ATN With hypotensive episodes requiring Levophed in the setting of a very complicated surgical history with multiple visits to the OR + DIC + diffuse small bowel necrosis. Creatinine fluctuating represents compromised renal function with unfortunately repeated insults. No evidence of obstruction. continued purulent drainage from JP drain in abdomen.   - Pt and family desire all aggressive interventions, confirmed 10/16/20 -startedCRRT 10/18/20-10/20/20  - Tolerated HD on 4/13 and 4/14; plan for dialysis again tomorrow; likely maintain TTS schedule - Will continue monitoring for any signs of renal recovery;  UOP increased now that he's off CRRT but still requiring RRT  2. Respiratory failure reintubated now on the ventilator--> s/p trach in OR 11/04/2020  3. Neuroendocrine tumor s/p Whipple with unfortunate extensive small bowel necrosis s/p resection. Family is hopeful for a small bowel transplant, however this is quite a long ways off d/t multiple issues. CT 4/6 showing possible pelvic abscess, had been on Zosyn  4. Severe malnutrition on TPN.  5. Anemia- Hgb 9.7.  6. Hypokalemia: continue w/ repletion as needed   Subjective:   Patient tolerated dialysis yesterday without significant issues.  High drain output.  Urine output 300 cc    Objective:   BP 110/69   Pulse 91   Temp 98.5 F (36.9 C) (Oral)   Resp (!) 21   Ht 6\' 2"  (1.88 m)   Wt 92.9 kg   SpO2 97%   BMI 26.30 kg/m   Intake/Output Summary (Last 24 hours) at 10/24/2020 1015 Last data filed at 10/24/2020 0700 Gross per 24 hour  Intake 2937.8 ml  Output 1807 ml  Net 1130.8 ml   Weight change: -10.1 kg  Physical Exam: Gen: ill-appearing, lying in bed, no distress CVS: Normal rate Resp: coarse bilateral breath sounds, mild increased work of breathing Abd: Distention present, drains in place Ext: no edema in  LE Access: RIJ temp  Imaging: No results found.  Labs: BMET Recent Labs  Lab 10/21/20 0511 10/21/20 1756 10/22/20 0607 10/22/20 1634 10/23/20 0552 10/23/20 1445 10/24/20 0456  NA 131* 134* 134* 135 134* 135 134*  K 3.2* 3.5 3.1* 3.0* 3.0* 3.3* 3.0*  CL 98 97* 96* 95* 93* 93* 96*  CO2 22 23 25 27 29 27 29   GLUCOSE 208* 194* 206* 189* 205* 144* 137*  BUN 64* 89* 112* 129* 74* 93* 26*  CREATININE 2.67* 3.46* 4.06* 4.35* 3.32* 4.12* 1.59*  CALCIUM 8.3* 8.6* 8.6* 8.5* 8.4* 8.5* 8.4*  PHOS 3.1 4.8* 5.2* 5.0* 3.0 3.5 1.3*   CBC Recent Labs  Lab 10/20/20 0515 10/21/20 0511 10/22/20 0607 10/23/20 0552 10/24/20 0457  WBC 13.1* 19.2* 17.6* 15.2* 15.3*  NEUTROABS 9.3*  --   --   --   --   HGB 9.7* 10.3* 9.6* 9.3* 9.7*  HCT 30.0* 31.2* 31.0* 28.7* 29.8*  MCV 85.7 86.7 88.3 85.7 86.6  PLT 331 411* 361 334 351    Medications:    . chlorhexidine  15 mL Mouth Rinse BID  . Chlorhexidine Gluconate Cloth  6 each Topical Daily  . heparin injection (subcutaneous)  5,000 Units Subcutaneous Q8H  . insulin aspart  0-20 Units Subcutaneous Q4H  . insulin aspart  2 Units Subcutaneous Q4H  . mouth rinse  15 mL Mouth Rinse q12n4p  . pantoprazole (PROTONIX) IV  40 mg Intravenous BID  . sodium chloride flush  10-40 mL Intracatheter Q12H  . sodium  chloride flush  5 mL Intracatheter Q8H      Reesa Chew  10/24/2020, 10:15 AM

## 2020-10-25 DIAGNOSIS — L8990 Pressure ulcer of unspecified site, unspecified stage: Secondary | ICD-10-CM | POA: Insufficient documentation

## 2020-10-25 DIAGNOSIS — D3A8 Other benign neuroendocrine tumors: Secondary | ICD-10-CM | POA: Diagnosis not present

## 2020-10-25 LAB — RENAL FUNCTION PANEL
Albumin: 1.4 g/dL — ABNORMAL LOW (ref 3.5–5.0)
Albumin: 1.3 g/dL — ABNORMAL LOW (ref 3.5–5.0)
Anion gap: 14 (ref 5–15)
Anion gap: 15 (ref 5–15)
BUN: 87 mg/dL — ABNORMAL HIGH (ref 6–20)
BUN: 108 mg/dL — ABNORMAL HIGH (ref 6–20)
CO2: 26 mmol/L (ref 22–32)
CO2: 25 mmol/L (ref 22–32)
Calcium: 8.5 mg/dL — ABNORMAL LOW (ref 8.9–10.3)
Calcium: 8.4 mg/dL — ABNORMAL LOW (ref 8.9–10.3)
Chloride: 97 mmol/L — ABNORMAL LOW (ref 98–111)
Chloride: 99 mmol/L (ref 98–111)
Creatinine, Ser: 4.64 mg/dL — ABNORMAL HIGH (ref 0.61–1.24)
Creatinine, Ser: 5.63 mg/dL — ABNORMAL HIGH (ref 0.61–1.24)
GFR, Estimated: 15 mL/min — ABNORMAL LOW (ref >60)
GFR, Estimated: 12 mL/min — ABNORMAL LOW (ref >60)
Glucose, Bld: 162 mg/dL — ABNORMAL HIGH (ref 70–99)
Glucose, Bld: 142 mg/dL — ABNORMAL HIGH (ref 70–99)
Phosphorus: 4.8 mg/dL — ABNORMAL HIGH (ref 2.5–4.6)
Phosphorus: 4.9 mg/dL — ABNORMAL HIGH (ref 2.5–4.6)
Potassium: 3.8 mmol/L (ref 3.5–5.1)
Potassium: 3.9 mmol/L (ref 3.5–5.1)
Sodium: 137 mmol/L (ref 135–145)
Sodium: 139 mmol/L (ref 135–145)

## 2020-10-25 LAB — GLUCOSE, CAPILLARY
Glucose-Capillary: 133 mg/dL — ABNORMAL HIGH (ref 70–99)
Glucose-Capillary: 139 mg/dL — ABNORMAL HIGH (ref 70–99)
Glucose-Capillary: 144 mg/dL — ABNORMAL HIGH (ref 70–99)
Glucose-Capillary: 145 mg/dL — ABNORMAL HIGH (ref 70–99)
Glucose-Capillary: 147 mg/dL — ABNORMAL HIGH (ref 70–99)
Glucose-Capillary: 161 mg/dL — ABNORMAL HIGH (ref 70–99)
Glucose-Capillary: 171 mg/dL — ABNORMAL HIGH (ref 70–99)

## 2020-10-25 MED ORDER — ZINC CHLORIDE 1 MG/ML IV SOLN
INTRAVENOUS | Status: AC
  Filled 2020-10-25: qty 1246.4

## 2020-10-25 MED ORDER — PIPERACILLIN-TAZOBACTAM IN DEX 2-0.25 GM/50ML IV SOLN
2.2500 g | Freq: Two times a day (BID) | INTRAVENOUS | Status: DC
  Filled 2020-10-25: qty 50

## 2020-10-25 NOTE — Progress Notes (Signed)
Referring Physician(s): [name]  Supervising Physician: [name], clinical  Patient Status:  [facility] - In-pt  Chief Complaint:  Left abdominal fluid collection/abscess  Subjective: Neuroendocrine tumor of pancreass/p Whipple procedure with hemicolectomy in OR 10/07/2020 by Dr. Zenia Resides; complicated by hemorrhagic shockwith DIC and subsequent diffuse small bowel necrosis s/p small bowel resection 09/16/6576; further complication by intra-abdominal fluid collections/p left lateral abdominal drain placement in IR 10/21/2020. Patient laying in bed resting comfortably, tracheostomy in place. He opens eyes to voice and nods to yes/no questions appropriately; continues to have some abdominal soreness.   Allergies: Ace inhibitors  Medications: Prior to Admission medications   Medication Sig Start Date End Date Taking? Authorizing Provider  acetaminophen (TYLENOL) 500 MG tablet Take 2 tablets (1,000 mg total) by mouth every 6 (six) hours. Patient taking differently: Take 1,000 mg by mouth every 6 (six) hours as needed for mild pain. 08/22/20  Yes [name], MD  allopurinol (ZYLOPRIM) 100 MG tablet Take one tablet by mouth daily for 1 week and then 2 tablets by mouth daily Patient taking differently: Take 200 mg by mouth daily. 07/03/20  Yes Swords, Darrick Penna, MD  amiodarone (PACERONE) 200 MG tablet Take 1 tablet (200 mg total) by mouth daily. Patient taking differently: Take 200 mg by mouth daily with supper. 07/30/20  Yes Evans Lance, MD  aspirin 81 MG tablet Take 1 tablet (81 mg total) by mouth 2 (two) times daily. Patient taking differently: Take 81 mg by mouth daily. 09/22/15  Yes Funches, Josalyn, MD  bisoprolol (ZEBETA) 10 MG tablet Take 1 tablet by mouth once daily Patient taking differently: Take 10 mg by mouth daily. 08/25/20  Yes Evans Lance, MD  budesonide-formoterol [facility]) 80-4.5 MCG/ACT inhaler Inhale 2 puffs into the lungs 2 (two) times daily. Must keep upcoming office  visit for refills Patient taking differently: Inhale 2 puffs into the lungs 2 (two) times daily as needed (for flares). 11/09/19  Yes Fulp, Cammie, MD  sacubitril-valsartan (ENTRESTO) 24-26 MG Take 1 tablet by mouth 2 (two) times daily. 06/25/20  Yes Evans Lance, MD  amiodarone (PACERONE) 200 MG tablet TAKE 1 TABLET (200 MG TOTAL) BY MOUTH DAILY. 07/30/20 07/30/21  Evans Lance, MD  amiodarone (PACERONE) 200 MG tablet TAKE 1 TABLET (200 MG TOTAL) BY MOUTH DAILY. 11/15/19 11/14/20  Evans Lance, MD  bisoprolol (ZEBETA) 10 MG tablet TAKE 1 TABLET (10 MG TOTAL) BY MOUTH DAILY. 11/15/19 11/14/20  Evans Lance, MD  oxyCODONE (OXY IR/ROXICODONE) 5 MG immediate release tablet Take 1-2 tablets (5-10 mg total) by mouth every 6 (six) hours as needed for severe pain. Patient not taking: Reported on 09/10/2020 08/22/20   [name], MD     Vital Signs: BP 105/67 (BP Location: Left Arm)   Pulse 86   Temp 97.9 F (36.6 C) (Oral)   Resp 20   Ht 6\' 2"  (1.88 m)   Wt 204 lb 12.9 oz (92.9 kg)   SpO2 96%   BMI 26.30 kg/m   Physical Exam patient awake, nods yes or no to questions.  Left abdominal drain intact, insertion site okay, mildly tender, output 215 cc of reddish-brown fluid  Imaging: CT IMAGE GUIDED DRAINAGE BY PERCUTANEOUS CATHETER  Result Date: 10/21/2020 INDICATION: History of pancreatic neuroendocrine tumor, post Whipple with intraoperative sn the laceration ultimately requiring complete resection of the small bowel found to have a persistent fluid collection within the root of the abdominal mesentery. Given persistent fevers, request made  for CT-guided aspiration and/or drainage catheter placement for infection source control purposes. EXAM: CT IMAGE GUIDED DRAINAGE BY PERCUTANEOUS CATHETER COMPARISON:  CT abdomen pelvis-earlier same day; 10/15/2020 MEDICATIONS: The patient is currently admitted to the hospital and receiving intravenous antibiotics. The antibiotics were administered within  an appropriate time frame prior to the initiation of the procedure. ANESTHESIA/SEDATION: Moderate (conscious) sedation was employed during this procedure. A total of Versed 0.5 mg and Fentanyl 25 mcg was administered intravenously. Moderate Sedation Time: 15 minutes. The patient's level of consciousness and vital signs were monitored continuously by radiology nursing throughout the procedure under my direct supervision. CONTRAST:  None COMPLICATIONS: None immediate. PROCEDURE: Informed written consent was obtained from the patient's family after a discussion of the risks, benefits and alternatives to treatment. The patient was placed supine on the CT gantry and a pre procedural CT was performed re-demonstrating the known abscess/fluid collection within the root of the abdominal mesentery with dominant component measuring approximately 9.7 x 3.5 cm (image 15, series 3). The procedure was planned. A timeout was performed prior to the initiation of the procedure. The skin overlying the left lateral abdomen was prepped and draped in the usual sterile fashion. The overlying soft tissues were anesthetized with 1% lidocaine with epinephrine. Appropriate trajectory was planned with the use of a 22 gauge spinal needle. An 18 gauge trocar needle was advanced into the abscess/fluid collection and a short Amplatz super stiff wire was coiled within the collection. Appropriate positioning was confirmed with a limited CT scan. The tract was serially dilated allowing placement of a 10 [place] all-purpose drainage catheter. Appropriate positioning was confirmed with a limited postprocedural CT scan. Despite appropriate drainage catheter positioning, only approximately 2 cc of thick bloody fluid was able to be aspirated. As such, several rounds of attempted lavage aspiration for attempted however failed to yield any additional fluid. The tube was connected to a JP bulb and sutured in place. A dressing was placed. The patient tolerated  the procedure well without immediate post procedural complication. IMPRESSION: Successful CT guided placement of a 10 French all purpose drain catheter into the postoperative hematoma within the root of the abdominal mesentery with aspiration of only 2 mL of thick bloody fluid. Samples were sent to the laboratory as requested by the ordering clinical team. PLAN: As this fluid collection appears to represent a hematoma, if the drainage catheter fails to provide any significant yield within the next 1-2 days would have low hesitation to remove the drainage catheter at the patient's bedside. Above was discussed with providing surgeon, Dr. Zenia Resides, the time of procedure completion. Electronically Signed   By: [name] M.D.   On: 10/21/2020 16:58    Labs:  CBC: Recent Labs    10/21/20 0511 10/22/20 0607 10/23/20 0552 10/24/20 0457  WBC 19.2* 17.6* 15.2* 15.3*  HGB 10.3* 9.6* 9.3* 9.7*  HCT 31.2* 31.0* 28.7* 29.8*  PLT 411* 361 334 351    COAGS: Recent Labs    10/08/2020 1228 10/02/2020 1947 09/24/20 0400 09/24/20 1650 09/18/2020 1909 09/09/2020 1624 10/21/20 1418  INR 1.5* 0.9   < > 1.4* 1.4* 1.4* 1.9*  APTT 37* 35  --  32 39*  --   --    < > = values in this interval not displayed.    BMP: Recent Labs    12/03/19 1703 05/24/20 1956 07/03/20 1643 07/21/20 0921 08/21/20 1037 10/24/20 0456 10/24/20 1557 10/24/20 1820 10/25/20 0445  NA 144   < > 142 142   < >  134* 126* 135 137  K 4.6   < > 3.7 4.2   < > 3.0* >7.5* 3.9 3.8  CL 109*   < > 103 104   < > 96* 86* 97* 97*  CO2 21   < > 25 27   < > 29 23 27 26   GLUCOSE 103*   < > 105* 83   < > 137* 1,653* 167* 162*  BUN 15   < > 18 16   < > 26* 52* 61* 87*  CALCIUM 9.5   < > 9.1 9.5   < > 8.4* 7.0* 8.0* 8.5*  CREATININE 1.27   < > 0.96 1.19   < > 1.59* 3.24* 3.55* 4.64*  GFRNONAA 66   < > 92 71   < > 53* 23* 20* 15*  GFRAA 77  --  107 82  --   --   --   --   --    < > = values in this interval not displayed.    LIVER FUNCTION  TESTS: Recent Labs    10/20/20 0515 10/20/20 1643 10/21/20 0511 10/21/20 1756 10/22/20 0607 10/22/20 1634 10/23/20 0552 10/23/20 1445 10/24/20 0456 10/24/20 1557 10/24/20 1820 10/25/20 0445  BILITOT 3.9*  --  4.0*  --  4.2*  --  4.4*  --   --   --   --   --   AST 98*  --  96*  --  86*  --  103*  --   --   --   --   --   ALT 94*  --  98*  --  96*  --  105*  --   --   --   --   --   ALKPHOS 115  --  124  --  105  --  98  --   --   --   --   --   PROT 7.8  --  7.8  --  7.7  --  7.5  --   --   --   --   --   ALBUMIN 1.5*   < > 1.7*   < > 1.6*   < > 1.5*   < > 1.6* 1.2* 1.4* 1.4*   < > = values in this interval not displayed.    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 Zenia Resides; complicated by hemorrhagic shockwith DIC and subsequent diffuse small bowel necrosis s/p small bowel resection 10/02/5571; further complication by intra-abdominal fluid collections/p left lateral abdominal drain placement in IR 10/21/2020; cultures with Klebsiella, afebrile, creatinine 4.64; continue current treatment, monitor output/labs closely; obtain follow-up CT once output diminishes or if clinical status worsens.   Electronically Signed: D. Rowe Robert, PA-C 10/25/2020, 11:45 AM   I spent a total of 15 minutes at the the patient's bedside AND on the patient's hospital floor or unit, greater than 50% of which was counseling/coordinating care for left abdominal abscess drain    Patient ID: [name], male   DOB: 1971/06/19, 50 y.o.   MRN: [id number]

## 2020-10-25 NOTE — Progress Notes (Signed)
PHARMACY NOTE:  ANTIMICROBIAL RENAL DOSAGE ADJUSTMENT  Current antimicrobial regimen includes a mismatch between antimicrobial dosage and estimated renal function.  As per policy approved by the Pharmacy & Therapeutics and Medical Executive Committees, the antimicrobial dosage will be adjusted accordingly.  Current antimicrobial dosage:  piperacillin/tazobactam 3.375g q8hr  Indication: Left abdominal fluid collection/abscess  Renal Function:  Estimated Creatinine Clearance: 18.5 mL/min (A) (by C-G formula based on SCr of 5.63 mg/dL (H)).      Antimicrobial dosage has been changed to:  piperacillin/tazobactam 3.375g q12h     Thank you for allowing pharmacy to be a part of this patient's care.  Benetta Spar, PharmD, BCPS, BCCP Clinical Pharmacist  Please check AMION for all [place] phone numbers After 10:00 PM, call [place] [phone]

## 2020-10-25 NOTE — Progress Notes (Signed)
[name], MRN:  [id number], DOB:  Aug 23, 1970, LOS: 54 ADMISSION DATE:  09/16/2020, CONSULTATION DATE:  10/25/20 REFERRING MD:  Anesthesia, CHIEF COMPLAINT:  Whipple, post-op shock   Brief History:  50 y.o. M with PMH of neuroendocrine tumor and underwent Whipple procedure 3/14 with intra-operative SMV laceration and repair by vascular with bovine pericardial patch.  Pt had worsening shock overnight and required three pressors despite massive transfusion protocol (received 150 blood products).  He was taken back to the OR for washout on 3/15 and returned to the ICU intubated.  Taken back 3/17 again found to have small bowel necrosis s/p resection.  Plans again to return 3/19 to OR.   Totality of small bowel has been resected. The case was discussed with [facility] who may consider eval for sm intestinal transplant pending clinical course.  Continuing aggressive supportive care.   Past Medical History:   has a past medical history of AICD (automatic cardioverter/defibrillator) present, Dyslipidemia, Heart failure with mildly reduced EF, History of kidney stones, HTN (hypertension), ventricular fibrillation, and Nonischemic cardiomyopathy.  Significant Hospital Events:  3/14: Admit to Surgery, to OR for whipple, SMV injury and repair, massive transfusion protocol overnight  3/15: Back to OR for washout, x2. IR for arteriogram. No major bleed source identified in OR, or arterial bleed w IR. Robust product resuscitation >75 products (+ TXA + novoseven) 3/16: off pressors, add'l 39 products overnight. Slowing resuscitation this morning with hemodynamic improvements and slowing drain output; OR x 2 - washout, biliary t tube, wound vac -- washout, wound vac, IR for arteriogram -- no arterial bleed for embolization 3/17:  Aggressive balanced transfusion continue overnight with marked hemodynamic improvement since yesterday evening. Off pressors x several hours, Wound vac output significantly slowing  (from 540ml q15-54min to 553ml q1hr+) and output is much thinner, Thin bloody secretions from mouth approx 24ml, Decreased RR from 22 to 18, Unable to tolerate NE- severe bradycardia 3/19 taken back to OR. Small bowel noted to be almost completely necrosed. Patient was closed with plans for goals of car discussion.  3/20 GOC family endorses full scope of treatment. Primary team discussed with [facility] the possibility of transfer for small bowel transplant. [facility] felt as though transfer would not change outcome. 3/21 Ongoing discussion with family and [facility]. Patient may be a candidate for transplant if he can stabilize post a small bowel resection. He was taken back to the OR and small bowel was resected.  3/23 weaning pressors. Versed off. Remains encephalopathic 3/24 off pressors. Low dose dilaudid gtt -- only weakly grimaces to pain. Changing sedation to precedex + PRN fentanyl. Long discussion with 2 brothers regarding clinical case -- tried to clarify that while the term "stable" has been used, in this instance is meaning that he has not declined from previous shift but is in fact still critically ill with multisystem organ dysfunction.  3/25 Cr and BUN increased. Off sedation  3/26 Pressors off, CT head benign, BUN/Cr continue to creep up 3/27 Tachycardia/HTN overnight improved with fentanyl gtt 2/28 PCCM sign off. Trauma to take over vent/CCM needs.  3/30 extubated 4/2 desaturated overnight to the 30s improved with BVM and NTS.  4/5 poor airway protection. PCCM consulted for re-intubation.  4/6 CT a/p Large RLL opacity, smaller LLL opacity. Post op changes. 5.4cm fluid collection in posterior pelvis, concerning for abscess. Started on Zosyn  4/7 got transfused overnight and then diuresed. Worse renal function 4/9 transfused again. HD line inserted and started on  CRRT 4/10 started on trach collar trials. 4/12 off CRRT and transitioned to IHD. Surgery placed abdominal drain with drainage consistent  with hematoma 4/13, 4/14 Tolerated dialysis 4/15 Placed on vent overnight for rest  Consults:  PCCM VVS  IR  Palliative  Procedures:  PICC 3/25 > JP x 2  Biliary drain Pancreatic stent drain ETT 2/24>>3/13; 3/15 >> 3/29;  4/5 >  Significant Diagnostic Tests:  See Radiology tab   Most recent significant exam was CT a/p 4/6: which revealed R>L pulmonary opacities, and pelvic fluid collection concerning for abscess as well as detailing the numerous post-op changes.   Micro Data:  See micro tab 4/5 trach asp >> Klebsiella pneumoniae 4/12 Wound >> pan sensitive Klebsiella pneumoniae  Antimicrobials:  Zosyn 3/21 > 3/29; 4/5 >> Eraxis 3/23> 3/29, 4/12>  Interim History / Subjective:   Tolerated trach collar yesterday. Mild tachynea without desaturations near end of the day. Placed on vent for rest  Objective   Blood pressure 105/67, pulse 86, temperature 97.9 F (36.6 C), temperature source Oral, resp. rate 20, height 6\' 2"  (1.88 m), weight 92.9 kg, SpO2 96 %.    Vent Mode: PRVC FiO2 (%):  [28 %-30 %] 28 % Set Rate:  [24 bmp] 24 bmp Vt Set:  [650 mL] 650 mL PEEP:  [5 cmH20] 5 cmH20 Plateau Pressure:  [14 cmH20-19 cmH20] 17 cmH20   Intake/Output Summary (Last 24 hours) at 10/25/2020 0951 Last data filed at 10/25/2020 0800 Gross per 24 hour  Intake 3200.59 ml  Output 1790 ml  Net 1410.59 ml   Filed Weights   10/22/20 1630 10/22/20 1958 10/24/20 0345  Weight: 103 kg 103 kg 92.9 kg   Physical Exam: General: Chronically ill-appearing, no acute distress HENT: [place], AT, OP clear, MMM Neck: Trach in place, c/d/i Eyes: EOMI, no scleral icterus Respiratory: Clear to auscultation bilaterally.  No crackles, wheezing or rales Cardiovascular: RRR, -M/R/G, no JVD GI: Wound vac in place, multiple drains in place Extremities:-Edema,-tenderness Neuro: AAO x4, CNII-XII grossly intact, follows commands, muscle wasting  K 3.8 BUN/Cr 87/4.64  Imaging, labs and test noted above  have been reviewed independently by me.  Resolved Hospital Problem list   Hemorrhagic shock hypophosphatemia Septic shock  Assessment & Plan:   Critically ill due to acute hypoxemic respiratory failure requiring mechanical ventilation. due to generalized weakness and possible aspiration pneumonia - Trach collar trial as tolerated - Full vent support as needed - Continue Speech with Passy-Muir valve.  - Continue PT  Pancreatic neuroendocrine tumor s/p whipple 3/21 b/c hemorrhagic shock, c/b small bowel necrosis now s/p total small bowel resection and externa; drainage for bile duct pancreatic duct and stomach.  - Per surgery for managment - Continue TPN - Family's continued hope is for transfer to [facility] for small bowel transplant. Suspect pt has quite a ways to go before stable enough to be considered, and am unsure if underlying conditions like NICM would preclude candidacy.   Fever and leukocytosis - improving Afebrile >72 hours. CT abdomen with no new fluid collections. - F/u blood cultures from 4/12  - Continue current Zosyn and Eraxis for further 10 days.    AKI, worse since reintubation - possible hypotensive hit [place] [facility] consult. HD scheduled today per TTS schedule .  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   NICM s/p ICD - EF  has improved based on recent echocardiogram.  Well compensated at this time.  - Lack of digestive tract precludes usual GDHFT.  [place] Suspect poor prognosis.  The family hopes for a small intestine transplant --[facility] has suggested he might be candidate but only if we can get him to the point being rehabilitation ready.     Daily Goals Checklist  Pain/Anxiety/Delirium protocol (if indicated): PRN only for RASS goal 0 VAP protocol (if indicated): bundle in place.   DVT prophylaxis: SCD Nutrition  Status: TPN - needs tunneled line. Plan for early next week if fever and leukocytosis improve.  GI prophylaxis: Pantoprazole Urinary catheter: external Central lines: PICC 3/25 Glucose control: -SSI + TPN Mobility/therapy needs: PT Restraints: Restraint type:NA Reason for restraints:NA Daily labs: CBC, CMP daily Code Status: Partial - -no CV/DF (has an ICD however) Family Communication: per primary Disposition: ICU.  Goals of Care:  Last date of multidisciplinary goals of care discussion: 91/5 Family and staff present: Dr. Zenia Resides Summary of discussion: Family wishes for aggressive care.   Code Status:  Partial - no CV/DF by [place] (has an ICD which is not to be turned off)  Discussed plan with patient, RN and RT  The patient is critically ill  and requires high complexity decision making for assessment and support, frequent evaluation and titration of therapies, application of advanced monitoring technologies and extensive interpretation of multiple databases.  Independent Critical Care Time: 35 Minutes.   Rodman Pickle, M.D. [facility] Pulmonary/Critical Care Medicine 10/25/2020 9:51 AM   Please see Amion for pager number to reach on-call Pulmonary and Critical Care Team.

## 2020-10-25 NOTE — Progress Notes (Signed)
Patient ID: [name], male   DOB: Jan 29, 1971, 50 y.o.   MRN: [id number] 8 Days Post-Op   Subjective: Awake on vent ROS negative except as listed above. Objective: Vital signs in last 24 hours: Temp:  [98.3 F (36.8 C)-99.7 F (37.6 C)] 98.3 F (36.8 C) (04/15 1600) Pulse Rate:  [75-95] 82 (04/16 0800) Resp:  [17-34] 28 (04/16 0800) BP: (97-112)/(55-73) 105/67 (04/16 0800) SpO2:  [93 %-100 %] 99 % (04/16 0800) FiO2 (%):  [28 %-30 %] 30 % (04/16 0400) Last BM Date:  (pta)  Intake/Output from previous day: 04/15 0701 - 04/16 0700 In: 3219.6 [I.V.:2165.9; IV Piggyback:1053.8] Out: 1790 [Urine:300; Drains:1490] Intake/Output this shift: No intake/output data recorded.  General appearance: alert Resp: clear to auscultation bilaterally Cardio: regular rate and rhythm GI: soft, VAC on midline, drains working  Lab Results: CBC  Recent Labs    10/23/20 0552 10/24/20 0457  WBC 15.2* 15.3*  HGB 9.3* 9.7*  HCT 28.7* 29.8*  PLT 334 351   BMET Recent Labs    10/24/20 1820 10/25/20 0445  NA 135 137  K 3.9 3.8  CL 97* 97*  CO2 27 26  GLUCOSE 167* 162*  BUN 61* 87*  CREATININE 3.55* 4.64*  CALCIUM 8.0* 8.5*   PT/INR No results for input(s): LABPROT, INR in the last 72 hours. ABG No results for input(s): PHART, HCO3 in the last 72 hours.  Invalid input(s): PCO2, PO2  Studies/Results: No results found.  Anti-infectives: Anti-infectives (From admission, onward)   Start     Dose/Rate Route Frequency Ordered Stop   10/22/20 0930  anidulafungin (ERAXIS) 100 mg in sodium chloride 0.9 % 100 mL IVPB       "Followed by" Linked Group Details   100 mg 78 mL/hr over 100 Minutes Intravenous Every 24 hours 10/21/20 0840 11/01/20 0929   10/21/20 2200  piperacillin-tazobactam (ZOSYN) IVPB 2.25 g        2.25 g 100 mL/hr over 30 Minutes Intravenous Every 8 hours 10/21/20 1410     10/21/20 1400  piperacillin-tazobactam (ZOSYN) IVPB 2.25 g  Status:  Discontinued         2.25 g 100 mL/hr over 30 Minutes Intravenous Every 8 hours 10/21/20 0742 10/21/20 1410   10/21/20 0930  anidulafungin (ERAXIS) 200 mg in sodium chloride 0.9 % 200 mL IVPB       "Followed by" Linked Group Details   200 mg 78 mL/hr over 200 Minutes Intravenous  Once 10/21/20 0840 10/21/20 1411   10/19/20 1600  piperacillin-tazobactam (ZOSYN) IVPB 3.375 g  Status:  Discontinued        3.375 g 100 mL/hr over 30 Minutes Intravenous Every 6 hours 10/19/20 1048 10/21/20 0742   10/18/20 1600  piperacillin-tazobactam (ZOSYN) IVPB 3.375 g  Status:  Discontinued        3.375 g 100 mL/hr over 30 Minutes Intravenous Every 6 hours 10/18/20 1123 10/19/20 1048   10/15/20 0400  piperacillin-tazobactam (ZOSYN) IVPB 3.375 g  Status:  Discontinued        3.375 g 12.5 mL/hr over 240 Minutes Intravenous Every 8 hours 10/14/20 2107 10/18/20 1123   10/14/20 2200  piperacillin-tazobactam (ZOSYN) IVPB 3.375 g        3.375 g 100 mL/hr over 30 Minutes Intravenous  Once 10/14/20 2107 10/14/20 2238   10/01/20 1100  anidulafungin (ERAXIS) 100 mg in sodium chloride 0.9 % 100 mL IVPB  Status:  Discontinued       "Followed by" Linked Group Details  100 mg 78 mL/hr over 100 Minutes Intravenous Every 24 hours 09/30/20 1014 10/08/20 0951   09/30/20 1100  anidulafungin (ERAXIS) 200 mg in sodium chloride 0.9 % 200 mL IVPB       "Followed by" Linked Group Details   200 mg 78 mL/hr over 200 Minutes Intravenous  Once 09/30/20 1014 09/30/20 1516   09/13/2020 1600  vancomycin (VANCOREADY) IVPB 1000 mg/200 mL  Status:  Discontinued        1,000 mg 200 mL/hr over 60 Minutes Intravenous Every 24 hours 09/21/2020 1137 09/20/2020 1510   09/28/2020 1400  piperacillin-tazobactam (ZOSYN) IVPB 3.375 g  Status:  Discontinued        3.375 g 12.5 mL/hr over 240 Minutes Intravenous Every 8 hours 09/18/2020 1322 10/08/20 0952   09/21/2020 1430  vancomycin (VANCOREADY) IVPB 1750 mg/350 mL  Status:  Discontinued        1,750 mg 175 mL/hr over 120  Minutes Intravenous Every 24 hours 09/24/2020 1349 09/12/2020 1137   09/26/20 1130  vancomycin (VANCOREADY) IVPB 2000 mg/400 mL        2,000 mg 200 mL/hr over 120 Minutes Intravenous  Once 09/26/20 1031 09/26/20 1411   09/26/20 1030  vancomycin variable dose per unstable renal function (pharmacist dosing)  Status:  Discontinued         Does not apply See admin instructions 09/26/20 1031 09/28/20 0816   10/02/2020 2200  metroNIDAZOLE (FLAGYL) IVPB 500 mg  Status:  Discontinued        500 mg 100 mL/hr over 60 Minutes Intravenous Every 8 hours 09/15/2020 1820 09/11/2020 1322   09/28/2020 2000  cefTRIAXone (ROCEPHIN) 2 g in sodium chloride 0.9 % 100 mL IVPB  Status:  Discontinued        2 g 200 mL/hr over 30 Minutes Intravenous Every 24 hours 09/15/2020 1820 09/26/2020 1322   09/17/2020 1330  vancomycin (VANCOCIN) 2,250 mg in sodium chloride 0.9 % 500 mL IVPB  Status:  Discontinued        2,250 mg 250 mL/hr over 120 Minutes Intravenous Every 48 hours 10/09/2020 1228 09/30/2020 1320   09/20/2020 1300  cefTRIAXone (ROCEPHIN) 2 g in sodium chloride 0.9 % 100 mL IVPB  Status:  Discontinued        2 g 200 mL/hr over 30 Minutes Intravenous Every 24 hours 09/26/2020 1208 09/24/2020 1320   10/06/2020 1300  metroNIDAZOLE (FLAGYL) IVPB 500 mg  Status:  Discontinued        500 mg 100 mL/hr over 60 Minutes Intravenous Every 8 hours 09/24/2020 1208 09/10/2020 1320   09/28/2020 0915  metroNIDAZOLE (FLAGYL) IVPB 500 mg  Status:  Discontinued        500 mg 100 mL/hr over 60 Minutes Intravenous To Surgery 09/18/2020 0903 09/17/2020 0951   10/09/2020 0630  ceFAZolin (ANCEF) IVPB 2g/100 mL premix       "And" Linked Group Details   2 g 200 mL/hr over 30 Minutes Intravenous On call to O.R. 09/21/2020 9518 09/09/2020 1215   09/17/2020 0630  metroNIDAZOLE (FLAGYL) IVPB 500 mg       "And" Linked Group Details   500 mg 100 mL/hr over 60 Minutes Intravenous On call to O.R. 09/12/2020 8416 09/21/2020 0820      Assessment/Plan: 50 yo male 1 month s/p Whipple,  right colectomy and SMV repair for T3N0 neuroendocrine tumor. C/b postop hemorrhagic shock with DIC and subsequent diffuse small bowel necrosis, now s/p small bowel resection with external drainage of bile duct, pancreatic duct and  stomach. - Continue antibiotics - Blood cultures negative to date - Acute renal failure: intermittent HD, nephrology following - Continue TPN. Will ultimately need tunneled line. - G tube to gravity - Continue trach collar as tolerated (did most of the day yesterday and vent overnight) - Vac change MWF - Aggressive PT/OT - Minimal output from recently placed perc drain, plan for removal today - VTE: SQH - Dispo: ICU, communication ongoing by Dr. Zenia Resides with SB transplant program at [facility]  LOS: 4 days    Georganna Skeans, MD, MPH, FACS Trauma & General Surgery Use [email or web address] to contact on call provider  10/25/2020

## 2020-10-25 NOTE — Progress Notes (Addendum)
PHARMACY - TOTAL PARENTERAL NUTRITION CONSULT NOTE  Indication:  Short bowel syndrome  Patient Measurements: Height: 6\' 2"  (188 cm) Weight: 124.9 kg (275 lb 5.7 oz) IBW/kg (Calculated) : 82.2 TPN AdjBW (KG): 95.3 Body mass index is 35.35 kg/m.  Assessment:  16 YOM with history of mass adjacent to head of the pancreas in 2021. 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  Developed cholecystitis in Feb 2022 and had percutaneous cholecystectomy tube placement.  Presented this admission for neuroendocrine tumor resection with Whipple procedure, also had right total colectomy, colostomy and cholecystectomy on 10/07/2020. SMV was lacerated during procedure and repaired by bovine pericardial patch. Developed hemorrhagic shock with bile leak post-op and underwent placement of biliary T tube with abdominal washout on 3/15 PM. Abdomen remains open. Pharmacy consulted to manage TPN.  Glucose / Insulin: no hx DM, A1c 5.2%. CBGs mostly controlled. Utilized 23 units SSI in the past 24h hours, 60 units in TPN, Novolog 2 units Q4H (hold for CBG < 150).   Electrolytes: low CL, K 3.8, CoCa high normal at 10.6 (iCa 1.16 low normal on 4/7), Phos 4.8, others WNL Renal: CRRT started 4/9 > 4/11, tolerated first and second iHD on 4/13 and 4/15, next HD 4/15 (4K baths), BUN up 87 Hepatic: LFTs mildly elevated, tbili 4.4 (scleral yellowing still noted per RN 4/3) Albumin down 1.4, prealbumin 22.8 (4/11) TG peaked at 740, now at 328 on 4/14 - Intralipids 100g/wk and monitor TG trends Intake / Output; MIVF: UOP 0.1 ml/kg/hr, drains (5 total): 1490L, 0cc from gastrostomy, net -3L  GI Imaging:  4/6 CT - pelvic fluid collection concerning for abscess 4/12 CT - no new fluid collections GI Surgeries / Procedures:  3/14 Whipple procedure with R hemicolectomy and end ileostomy, cholecystectomy 3/15 washout, VAC placement 3/17 washout, VAC replacement.  Necrosis of entire small bowel except for the  PB limb and proximal 40-50cm of jejunum 3/19 re-opening laparotomy, abd closure.  Necrotic SB. 3/21 SB resection, take down of choledochojejunal anastomosis and pancreaticojejunal anastomosis, take down of duodenojejunal anastomosis, placement of externalized biliary drain / Stamm gastrostomy tube, IA washout and placement of intraperitoneal drains 3/30 extubation 4/8 trach 4/12 left lateral abdominal drain placed by IR  Central access: R IJ CVC placed 3/15; changed to PICC placed 10/03/20 TPN start date: 09/16/2020  Nutritional Goals (RD rec on 4/14): 2800-3000 kCal, 185-210g AA, fluid >2L/day  Current Nutrition:  TPN  Plan:  - Continue concentrated TPN at 95 ml/hr - Intralipid 20% 216mL Mon/Thurs for now to prevent EFAD - TPN with Intralipid 2x/wk provides a daily average of 187g AA and 2869 kCal, meeting 100% of patient needs.  - Electrolytes in TPN: increase Na 176mEq/L on 4/14, increase K 60mEq/L on 4/14, removed Ca on 4/16, Mag 36mEq/L, add Phos 39mmol/L on 4/15, cont Cl:Ac 1:2 - Daily multivitamin in TPN.  Remove standard trace elements and add back zinc 5mg , and selenium 69mcg.  Remove chromium while on RRT. - Continue resistant SSI Q4H, continue 60 units regular insulin in TPN per DM coordinator recommendation, Novolog 2 units SQ Q6H. - Renal function BID  Thank you for involving pharmacy in this patient's care.  Renold Genta, PharmD, BCPS Clinical Pharmacist Clinical phone for 10/25/2020 until 3p is [phone] 10/25/2020 7:18 AM  **Pharmacist phone directory can be found on [email or web address] listed under [place]**

## 2020-10-25 NOTE — Progress Notes (Signed)
4 Trach sutures removed per protocol. No other sutures visible. RN notified.

## 2020-10-25 NOTE — Progress Notes (Signed)
Inpatient Rehab Admissions Coordinator Note:   Per therapy recommendations, pt was screened for CIR candidacy by Clemens Catholic, [place] CCC-SLP. At this time, Pt.is not medically stable  for CIR, but [facility] team will follow and re-screen 10/27/20  Please contact me with questions.   Clemens Catholic, [place], [place] Admissions Coordinator  [phone] ([place]) [phone] (office)

## 2020-10-25 NOTE — Progress Notes (Signed)
[facility] Progress Note     Assessment/ Plan:   1.Acute renal failure -likely has ATN With hypotensive episodes requiring Levophed in the setting of a very complicated surgical history with multiple visits to the OR + DIC + diffuse small bowel necrosis. Creatinine fluctuating represents compromised renal function with unfortunately repeated insults. No evidence of obstruction. continued purulent drainage from JP drain in abdomen.   - Pt and family desire all aggressive interventions, confirmed 10/16/20 -startedCRRT 10/18/20-10/20/20  - Tolerated HD on 4/13 and 4/14; dialysis again today likely maintaining tts schedule - Will continue monitoring for any signs of renal recovery;  UOP present but still oliguric   2. Respiratory failure reintubated now on the ventilator--> s/p trach in OR 10/11/2020  3. Neuroendocrine tumor s/p Whipple with unfortunate extensive small bowel necrosis s/p resection. Family is hopeful for a small bowel transplant, however this is quite a long ways off d/t multiple issues. CT 4/6 showing possible pelvic abscess, had been on Zosyn  4. Severe malnutrition on TPN.  5. Anemia- Hgb 9.7.  6. Hypokalemia: continue w/ repletion as needed   Subjective:   Stable for 24 hours. Patient without complaints. Minimal UOP.    Objective:   BP 105/67 (BP Location: Left Arm)   Pulse 86   Temp 97.9 F (36.6 C) (Oral)   Resp 20   Ht 6\' 2"  (1.88 m)   Wt 92.9 kg   SpO2 96%   BMI 26.30 kg/m   Intake/Output Summary (Last 24 hours) at 10/25/2020 0849 Last data filed at 10/25/2020 0800 Gross per 24 hour  Intake 3300.53 ml  Output 1790 ml  Net 1510.53 ml   Weight change:   Physical Exam: Gen: ill-appearing, lying in bed, no distress CVS: Normal rate Resp: coarse bilateral breath sounds, mild increased work of breathing Abd: Distention present, drains in place Ext: no edema in LE Access: RIJ temp  Imaging: No results  found.  Labs: BMET Recent Labs  Lab 10/22/20 1634 10/23/20 0552 10/23/20 1445 10/24/20 0456 10/24/20 1557 10/24/20 1820 10/25/20 0445  NA 135 134* 135 134* 126* 135 137  K 3.0* 3.0* 3.3* 3.0* >7.5* 3.9 3.8  CL 95* 93* 93* 96* 86* 97* 97*  CO2 27 29 27 29 23 27 26   GLUCOSE 189* 205* 144* 137* 1,653* 167* 162*  BUN 129* 74* 93* 26* 52* 61* 87*  CREATININE 4.35* 3.32* 4.12* 1.59* 3.24* 3.55* 4.64*  CALCIUM 8.5* 8.4* 8.5* 8.4* 7.0* 8.0* 8.5*  PHOS 5.0* 3.0 3.5 1.3* >30.0* 5.0* 4.8*   CBC Recent Labs  Lab 10/20/20 0515 10/21/20 0511 10/22/20 0607 10/23/20 0552 10/24/20 0457  WBC 13.1* 19.2* 17.6* 15.2* 15.3*  NEUTROABS 9.3*  --   --   --   --   HGB 9.7* 10.3* 9.6* 9.3* 9.7*  HCT 30.0* 31.2* 31.0* 28.7* 29.8*  MCV 85.7 86.7 88.3 85.7 86.6  PLT 331 411* 361 334 351    Medications:    . chlorhexidine  15 mL Mouth Rinse BID  . Chlorhexidine Gluconate Cloth  6 each Topical Daily  . heparin injection (subcutaneous)  5,000 Units Subcutaneous Q8H  . insulin aspart  0-20 Units Subcutaneous Q4H  . insulin aspart  2 Units Subcutaneous Q4H  . mouth rinse  15 mL Mouth Rinse q12n4p  . pantoprazole (PROTONIX) IV  40 mg Intravenous BID  . sodium chloride flush  10-40 mL Intracatheter Q12H  . sodium chloride flush  5 mL Intracatheter Q8H  Reesa Chew  10/25/2020, 8:49 AM

## 2020-10-26 DIAGNOSIS — D3A8 Other benign neuroendocrine tumors: Secondary | ICD-10-CM | POA: Diagnosis not present

## 2020-10-26 LAB — GLUCOSE, CAPILLARY
Glucose-Capillary: 142 mg/dL — ABNORMAL HIGH (ref 70–99)
Glucose-Capillary: 127 mg/dL — ABNORMAL HIGH (ref 70–99)
Glucose-Capillary: 132 mg/dL — ABNORMAL HIGH (ref 70–99)
Glucose-Capillary: 159 mg/dL — ABNORMAL HIGH (ref 70–99)
Glucose-Capillary: 136 mg/dL — ABNORMAL HIGH (ref 70–99)
Glucose-Capillary: 141 mg/dL — ABNORMAL HIGH (ref 70–99)

## 2020-10-26 LAB — CULTURE, BLOOD (ROUTINE X 2)
Culture: NO GROWTH
Culture: NO GROWTH
Special Requests: ADEQUATE

## 2020-10-26 LAB — RENAL FUNCTION PANEL
Albumin: 1.3 g/dL — ABNORMAL LOW (ref 3.5–5.0)
Albumin: 1.3 g/dL — ABNORMAL LOW (ref 3.5–5.0)
Anion gap: 15 (ref 5–15)
Anion gap: 9 (ref 5–15)
BUN: 131 mg/dL — ABNORMAL HIGH (ref 6–20)
BUN: 54 mg/dL — ABNORMAL HIGH (ref 6–20)
CO2: 26 mmol/L (ref 22–32)
CO2: 28 mmol/L (ref 22–32)
Calcium: 8.6 mg/dL — ABNORMAL LOW (ref 8.9–10.3)
Calcium: 8.5 mg/dL — ABNORMAL LOW (ref 8.9–10.3)
Chloride: 100 mmol/L (ref 98–111)
Chloride: 101 mmol/L (ref 98–111)
Creatinine, Ser: 6.62 mg/dL — ABNORMAL HIGH (ref 0.61–1.24)
Creatinine, Ser: 3.57 mg/dL — ABNORMAL HIGH (ref 0.61–1.24)
GFR, Estimated: 10 mL/min — ABNORMAL LOW (ref >60)
GFR, Estimated: 20 mL/min — ABNORMAL LOW (ref >60)
Glucose, Bld: 168 mg/dL — ABNORMAL HIGH (ref 70–99)
Glucose, Bld: 143 mg/dL — ABNORMAL HIGH (ref 70–99)
Phosphorus: 5.1 mg/dL — ABNORMAL HIGH (ref 2.5–4.6)
Phosphorus: 2.8 mg/dL (ref 2.5–4.6)
Potassium: 4 mmol/L (ref 3.5–5.1)
Potassium: 3.6 mmol/L (ref 3.5–5.1)
Sodium: 141 mmol/L (ref 135–145)
Sodium: 138 mmol/L (ref 135–145)

## 2020-10-26 MED ORDER — ZINC CHLORIDE 1 MG/ML IV SOLN
INTRAVENOUS | Status: AC
  Filled 2020-10-26: qty 1246.4

## 2020-10-26 MED ORDER — PIPERACILLIN-TAZOBACTAM IN DEX 2-0.25 GM/50ML IV SOLN
2.2500 g | Freq: Three times a day (TID) | INTRAVENOUS | Status: DC
  Administered 2020-10-26 – 2020-10-29 (×10): 2.25 g via INTRAVENOUS
  Filled 2020-10-26 (×12): qty 50

## 2020-10-26 NOTE — Progress Notes (Signed)
PHARMACY - TOTAL PARENTERAL NUTRITION CONSULT NOTE  Indication:  Short bowel syndrome  Patient Measurements: Height: 6\' 2"  (188 cm) Weight: 124.9 kg (275 lb 5.7 oz) IBW/kg (Calculated) : 82.2 TPN AdjBW (KG): 95.3 Body mass index is 35.35 kg/m.  Assessment:  89 YOM with history of mass adjacent to head of the pancreas in 2021. 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  Developed cholecystitis in Feb 2022 and had percutaneous cholecystectomy tube placement.  Presented this admission for neuroendocrine tumor resection with Whipple procedure, also had right total colectomy, colostomy and cholecystectomy on 09/21/2020. SMV was lacerated during procedure and repaired by bovine pericardial patch. Developed hemorrhagic shock with bile leak post-op and underwent placement of biliary T tube with abdominal washout on 3/15 PM. Abdomen remains open. Pharmacy consulted to manage TPN.  Glucose / Insulin: no hx DM, A1c 5.2%. CBGs mostly controlled. Utilized 19 units SSI in the past 24h hours, 60 units in TPN, Novolog 2 units Q4H (hold for CBG < 150).   Electrolytes: CoCa high normal at 10.8 (iCa 1.16 low normal on 4/7, calcium removed from TPN 4/16), Phos 5.1 (added low amount back to TPN 4/15), others WNL Renal: CRRT started 4/9 > 4/11, tolerated first and second iHD on 4/13 and 4/14, HD off schedule today (4K baths), BUN up 131 Hepatic: LFTs mildly elevated, tbili 4.4 (scleral yellowing still noted per RN 4/3) Albumin down 1.3, prealbumin 22.8 (4/11) TG peaked at 740, now at 328 on 4/14 - Intralipids 100g/wk and monitor TG trends Intake / Output; MIVF: UOP 0.1 ml/kg/hr, drains (5 total): 1534ml, 0cc from gastrostomy, net -1.3L  GI Imaging:  4/6 CT - pelvic fluid collection concerning for abscess 4/12 CT - no new fluid collections GI Surgeries / Procedures:  3/14 Whipple procedure with R hemicolectomy and end ileostomy, cholecystectomy 3/15 washout, VAC placement 3/17 washout,  VAC replacement.  Necrosis of entire small bowel except for the PB limb and proximal 40-50cm of jejunum 3/19 re-opening laparotomy, abd closure.  Necrotic SB. 3/21 SB resection, take down of choledochojejunal anastomosis and pancreaticojejunal anastomosis, take down of duodenojejunal anastomosis, placement of externalized biliary drain / Stamm gastrostomy tube, IA washout and placement of intraperitoneal drains 3/30 extubation 4/8 trach 4/12 left lateral abdominal drain placed by IR  Central access: R IJ CVC placed 3/15; changed to PICC placed 10/03/20 TPN start date: 10/03/2020  Nutritional Goals (RD rec on 4/14): 2800-3000 kCal, 185-210g AA, fluid >2L/day  Current Nutrition:  TPN  Plan:  - Continue concentrated TPN at 95 ml/hr - Intralipid 20% 288mL Mon/Thurs for now to prevent EFAD - TPN with Intralipid 2x/wk provides a daily average of 187g AA and 2869 kCal, meeting 100% of patient needs.  - Electrolytes in TPN: increase Na 158mEq/L on 4/14, increase K 33mEq/L on 4/14, removed Ca on 4/16, Mag 19mEq/L, decr Phos 57mmol/L on 4/17, cont Cl:Ac 1:2 - Daily multivitamin in TPN.  Remove standard trace elements and add back zinc 5mg , and selenium 36mcg.  Remove chromium while on RRT. - Continue resistant SSI Q4H, continue 60 units regular insulin in TPN per DM coordinator recommendation, Novolog 2 units SQ Q6H. - Renal function BID  Thank you for involving pharmacy in this patient's care.  [name], PharmD, BCPS Clinical Pharmacist Clinical phone for 10/26/2020 until 3p is [phone] 10/26/2020 7:20 AM  **Pharmacist phone directory can be found on [email or web address] listed under [place]**

## 2020-10-26 NOTE — Progress Notes (Signed)
Patients trach became dislodged. A #6 cuffless shiley trach was placed. Patient remained stable during trach change.

## 2020-10-26 NOTE — Progress Notes (Signed)
Pharmacy Antibiotic Note  [name] is a 50 y.o. male admitted on 09/28/2020 with pneumonia and intra-abdominal infection.  Pharmacy has been consulted for Zosyn dosing. Of note CRRT was stopped 4/11 and now on iHD with plan for TTS schedule - tolerated on 4/13 and 4/14.   Given worsening leukocytosis and persistent intra-abdominal collections on CT abdomen, MD wants to continue antibiotics for now. Also adding anidulafungin to cover for fungal infection.  He is currently afebrile and WBC overall down to 15.3. K pneumo in abscess sensitive to Zosyn (day 12); also on anidulafungin (day 6).  Plan: Change Zosyn to 2.25 g IV q8h for iHD Anidulafungin per MD  F/U plan for LOT  Height: 6\' 2"  (188 cm) Weight: 95.1 kg (209 lb 10.5 oz) IBW/kg (Calculated) : 82.2  Temp (24hrs), Avg:98.8 F (37.1 C), Min:97.9 F (36.6 C), Max:99.4 F (37.4 C)  Recent Labs  Lab 10/20/20 0515 10/20/20 1643 10/21/20 0511 10/21/20 1756 10/22/20 0607 10/22/20 1634 10/23/20 0552 10/23/20 1445 10/24/20 0457 10/24/20 1557 10/24/20 1820 10/25/20 0445 10/25/20 1716 10/26/20 0458  WBC 13.1*  --  19.2*  --  17.6*  --  15.2*  --  15.3*  --   --   --   --   --   CREATININE 1.61*   < > 2.67*   < > 4.06*   < > 3.32*   < >  --  QUESTIONABLE RESULTS, RECOMMEND RECOLLECT TO VERIFY 3.55* 4.64* 5.63* 6.62*   < > = values in this interval not displayed.    Estimated Creatinine Clearance: 15.7 mL/min (A) (by C-G formula based on SCr of 6.62 mg/dL (H)).    Allergies  Allergen Reactions  . Ace Inhibitors Cough    Antimicrobials this admission: Vanc 3/18 >> 3/21 CTX 3/17 >> 3/21 Flagyl 3/17 >> 3/21 Zosyn 3/21 >> 3/30, restarted 4/5 >> Eraxis 3/22 >> 3/29, restarted 4/12 >>  Dose adjustments this admission: 3/19 VR 9 - 1750 mg q24  Microbiology results: 3/14 MRSA PCR - negative 3/23 BCx - negF 4/5 TA >> rare klebsiella   4/12 BCx - NGTD 4/12 Abscess - Kleb pneumo, holding for anaerobes  Thank you  for involving pharmacy in this patient's care.  Renold Genta, PharmD, BCPS Clinical Pharmacist Clinical phone for 10/26/2020 until 3p is [phone] 10/26/2020 7:23 AM  **Pharmacist phone directory can be found on [email or web address] listed under [place]**

## 2020-10-26 NOTE — Progress Notes (Signed)
[name], MRN:  [id number], DOB:  1971-05-23, LOS: 35 ADMISSION DATE:  10/06/2020, CONSULTATION DATE:  10/26/20 REFERRING MD:  Anesthesia, CHIEF COMPLAINT:  Whipple, post-op shock   Brief History:  50 y.o. M with PMH of neuroendocrine tumor and underwent Whipple procedure 3/14 with intra-operative SMV laceration and repair by vascular with bovine pericardial patch.  Pt had worsening shock overnight and required three pressors despite massive transfusion protocol (received 150 blood products).  He was taken back to the OR for washout on 3/15 and returned to the ICU intubated.  Taken back 3/17 again found to have small bowel necrosis s/p resection.  Plans again to return 3/19 to OR.   Totality of small bowel has been resected. The case was discussed with [facility] who may consider eval for sm intestinal transplant pending clinical course.  Continuing aggressive supportive care.   Past Medical History:   has a past medical history of AICD (automatic cardioverter/defibrillator) present, Dyslipidemia, Heart failure with mildly reduced EF, History of kidney stones, HTN (hypertension), ventricular fibrillation, and Nonischemic cardiomyopathy.  Significant Hospital Events:  3/14: Admit to Surgery, to OR for whipple, SMV injury and repair, massive transfusion protocol overnight  3/15: Back to OR for washout, x2. IR for arteriogram. No major bleed source identified in OR, or arterial bleed w IR. Robust product resuscitation >75 products (+ TXA + novoseven) 3/16: off pressors, add'l 39 products overnight. Slowing resuscitation this morning with hemodynamic improvements and slowing drain output; OR x 2 - washout, biliary t tube, wound vac -- washout, wound vac, IR for arteriogram -- no arterial bleed for embolization 3/17:  Aggressive balanced transfusion continue overnight with marked hemodynamic improvement since yesterday evening. Off pressors x several hours, Wound vac output significantly slowing  (from 546ml q15-33min to 557ml q1hr+) and output is much thinner, Thin bloody secretions from mouth approx 252ml, Decreased RR from 22 to 18, Unable to tolerate NE- severe bradycardia 3/19 taken back to OR. Small bowel noted to be almost completely necrosed. Patient was closed with plans for goals of car discussion.  3/20 GOC family endorses full scope of treatment. Primary team discussed with [facility] the possibility of transfer for small bowel transplant. [facility] felt as though transfer would not change outcome. 3/21 Ongoing discussion with family and [facility]. Patient may be a candidate for transplant if he can stabilize post a small bowel resection. He was taken back to the OR and small bowel was resected.  3/23 weaning pressors. Versed off. Remains encephalopathic 3/24 off pressors. Low dose dilaudid gtt -- only weakly grimaces to pain. Changing sedation to precedex + PRN fentanyl. Long discussion with 2 brothers regarding clinical case -- tried to clarify that while the term "stable" has been used, in this instance is meaning that he has not declined from previous shift but is in fact still critically ill with multisystem organ dysfunction.  3/25 Cr and BUN increased. Off sedation  3/26 Pressors off, CT head benign, BUN/Cr continue to creep up 3/27 Tachycardia/HTN overnight improved with fentanyl gtt 2/28 PCCM sign off. Trauma to take over vent/CCM needs.  3/30 extubated 4/2 desaturated overnight to the 30s improved with BVM and NTS.  4/5 poor airway protection. PCCM consulted for re-intubation.  4/6 CT a/p Large RLL opacity, smaller LLL opacity. Post op changes. 5.4cm fluid collection in posterior pelvis, concerning for abscess. Started on Zosyn  4/7 got transfused overnight and then diuresed. Worse renal function 4/9 transfused again. HD line inserted and started on  CRRT 4/10 started on trach collar trials. 4/12 off CRRT and transitioned to IHD. Surgery placed abdominal drain with drainage consistent  with hematoma 4/13, 4/14 Tolerated dialysis 4/15 Placed on vent overnight for rest 4/16 Tolerating trach collar  Consults:  PCCM VVS  IR  Palliative  Procedures:  PICC 3/25 > JP x 2  Biliary drain Pancreatic stent drain ETT 2/24>>3/13; 3/15 >> 3/29;  4/5 >  Significant Diagnostic Tests:  See Radiology tab   Most recent significant exam was CT a/p 4/6: which revealed R>L pulmonary opacities, and pelvic fluid collection concerning for abscess as well as detailing the numerous post-op changes.   Micro Data:  See micro tab 4/5 trach asp >> Klebsiella pneumoniae 4/12 Wound >> pan sensitive Klebsiella pneumoniae  Antimicrobials:  Zosyn 3/21 > 3/29; 4/5 >> Eraxis 3/23> 3/29, 4/12>  Interim History / Subjective:   Tolerating trach collar >24 hours. Minimal secretions per RN. Receiving HD today  Objective   Blood pressure 117/82, pulse 87, temperature 98.3 F (36.8 C), temperature source Oral, resp. rate 18, height 6\' 2"  (1.88 m), weight 95.1 kg, SpO2 97 %.    FiO2 (%):  [28 %] 28 %   Intake/Output Summary (Last 24 hours) at 10/26/2020 1207 Last data filed at 10/26/2020 1136 Gross per 24 hour  Intake 2644.42 ml  Output 1870 ml  Net 774.42 ml   Filed Weights   10/26/20 0400 10/26/20 0745 10/26/20 1145  Weight: 95.1 kg 95.1 kg 95.1 kg   Physical Exam: General: Chronically ill-appearing, no acute distress HENT: [place], AT, OP clear, MMM Neck: Trach in place, c/d/i Eyes: EOMI, no scleral icterus Respiratory: Clear to auscultation bilaterally.  No crackles, wheezing or rales Cardiovascular: RRR, -M/R/G, no JVD GI: Wound vac in place, multiple drains in place Extremities:-Edema,-tenderness Neuro: AAO x4, CNII-XII grossly intact, follows commands, muscle wasting  K 4.0 BUN/Cr 131/6.62  Imaging, labs and test noted above have been reviewed independently by me.   Resolved Hospital Problem list   Hemorrhagic shock hypophosphatemia Septic shock  Klebsiella pneumoniae  pneumonia Assessment & Plan:   Critically ill due to acute hypoxemic respiratory failure requiring mechanical ventilation. due to generalized weakness and possible aspiration pneumonia - Continue trach collar as tolerated - Full vent support as needed - Trauma plans to downsize trach to size 6 today - Continue Speech with Passy-Muir valve.  - Continue PT  Pancreatic neuroendocrine tumor s/p whipple 3/21 b/c hemorrhagic shock, c/b small bowel necrosis now s/p total small bowel resection and externa; drainage for bile duct pancreatic duct and stomach.  - Per surgery for managment - Continue TPN. PICC ok for long term administration - Family's continued hope is for transfer to [facility] for small bowel transplant. Suspect pt has quite a ways to go before stable enough to be considered, and am unsure if underlying conditions like NICM would preclude candidacy.   Fever and leukocytosis - improving Afebrile >72 hours. CT abdomen with no new fluid collections. Blood cultures neg - Continue current Zosyn and Eraxis for further 10 days.    AKI>ESRD Tolerating iHD -Dialysis today -Appreciate Nephrology consult. Planning for TTS schedule .  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   NICM s/p ICD - EF has improved based on recent echocardiogram.  Well compensated at this time. Per code status - no shocks - Lack of digestive tract precludes usual  GDHFT.  [place] Full code except no shocks The family hopes for a small intestine transplant --[facility] has suggested he might be candidate but only if we can get him to the point being rehabilitation ready.     Daily Goals Checklist  Pain/Anxiety/Delirium protocol (if indicated): PRN only for RASS goal 0 VAP protocol (if indicated): bundle in place.   DVT prophylaxis: SCD Nutrition Status: TPN - needs tunneled line. Plan for early  next week if fever and leukocytosis improve.  GI prophylaxis: Pantoprazole Urinary catheter: external Central lines: PICC 3/25 Glucose control: -SSI + TPN Mobility/therapy needs: PT Restraints: Restraint type:NA Reason for restraints:NA Daily labs: CBC, CMP daily Code Status: Partial - -no CV/DF (has an ICD however) Family Communication: per primary Disposition: ICU.  Goals of Care:  Last date of multidisciplinary goals of care discussion: 53/5 Family and staff present: Dr. Zenia Resides Summary of discussion: Family wishes for aggressive care.   Code Status:  Partial - no CV/DF by [place] (has an ICD which is not to be turned off)  Discussed plan with patient and family yesterday. Plan discussed with RN and RT  The patient is critically ill with multiple organ systems failure and requires high complexity decision making for assessment and support, frequent evaluation and titration of therapies, application of advanced monitoring technologies and extensive interpretation of multiple databases.  Independent Critical Care Time: 35Minutes.   Rodman Pickle, M.D. [facility] Pulmonary/Critical Care Medicine 10/26/2020 12:07 PM   Please see Amion for pager number to reach on-call Pulmonary and Critical Care Team.

## 2020-10-26 NOTE — Progress Notes (Signed)
Trauma/Critical Care Follow Up Note  Subjective:    Overnight Issues:   Objective:  Vital signs for last 24 hours: Temp:  [98.3 F (36.8 C)-99.4 F (37.4 C)] 98.3 F (36.8 C) (04/17 0800) Pulse Rate:  [43-95] 94 (04/17 0845) Resp:  [17-28] 23 (04/17 0845) BP: (96-121)/(55-80) 110/68 (04/17 0845) SpO2:  [93 %-100 %] 100 % (04/17 0845) FiO2 (%):  [28 %] 28 % (04/17 0832) Weight:  [95.1 kg] 95.1 kg (04/17 0745)  Hemodynamic parameters for last 24 hours:    Intake/Output from previous day: 04/16 0701 - 04/17 0700 In: 2594.5 [I.V.:2264.5; IV Piggyback:330] Out: 1815 [Urine:300; Drains:1515]  Intake/Output this shift: Total I/O In: 240 [I.V.:190; IV Piggyback:50] Out: -   Vent settings for last 24 hours: FiO2 (%):  [28 %] 28 %  Physical Exam:  Gen: comfortable, no distress Neuro: non-focal exam HEENT: PERRL Neck: supple CV: RRR Pulm: unlabored breathing Abd: soft, NT, drains in place 1.5L, midline vac with good seal GU: clear yellow urine Extr: wwp, no edema   Results for orders placed or performed during the hospital encounter of 09/15/2020 (from the past 24 hour(s))  Glucose, capillary     Status: Abnormal   Collection Time: 10/25/20 11:53 AM  Result Value Ref Range   Glucose-Capillary 145 (H) 70 - 99 mg/dL  Glucose, capillary     Status: Abnormal   Collection Time: 10/25/20  4:08 PM  Result Value Ref Range   Glucose-Capillary 144 (H) 70 - 99 mg/dL  Renal function panel (daily at 1600)     Status: Abnormal   Collection Time: 10/25/20  5:16 PM  Result Value Ref Range   Sodium 139 135 - 145 mmol/L   Potassium 3.9 3.5 - 5.1 mmol/L   Chloride 99 98 - 111 mmol/L   CO2 25 22 - 32 mmol/L   Glucose, Bld 142 (H) 70 - 99 mg/dL   BUN 108 (H) 6 - 20 mg/dL   Creatinine, Ser 5.63 (H) 0.61 - 1.24 mg/dL   Calcium 8.4 (L) 8.9 - 10.3 mg/dL   Phosphorus 4.9 (H) 2.5 - 4.6 mg/dL   Albumin 1.3 (L) 3.5 - 5.0 g/dL   GFR, Estimated 12 (L) >60 mL/min   Anion gap 15 5 - 15   Glucose, capillary     Status: Abnormal   Collection Time: 10/25/20  7:45 PM  Result Value Ref Range   Glucose-Capillary 139 (H) 70 - 99 mg/dL  Glucose, capillary     Status: Abnormal   Collection Time: 10/25/20 11:34 PM  Result Value Ref Range   Glucose-Capillary 133 (H) 70 - 99 mg/dL  Glucose, capillary     Status: Abnormal   Collection Time: 10/26/20  3:44 AM  Result Value Ref Range   Glucose-Capillary 142 (H) 70 - 99 mg/dL  Renal function panel (daily at 0500)     Status: Abnormal   Collection Time: 10/26/20  4:58 AM  Result Value Ref Range   Sodium 141 135 - 145 mmol/L   Potassium 4.0 3.5 - 5.1 mmol/L   Chloride 100 98 - 111 mmol/L   CO2 26 22 - 32 mmol/L   Glucose, Bld 168 (H) 70 - 99 mg/dL   BUN 131 (H) 6 - 20 mg/dL   Creatinine, Ser 6.62 (H) 0.61 - 1.24 mg/dL   Calcium 8.6 (L) 8.9 - 10.3 mg/dL   Phosphorus 5.1 (H) 2.5 - 4.6 mg/dL   Albumin 1.3 (L) 3.5 - 5.0 g/dL   GFR, Estimated 10 (  L) >60 mL/min   Anion gap 15 5 - 15  Glucose, capillary     Status: Abnormal   Collection Time: 10/26/20  8:06 AM  Result Value Ref Range   Glucose-Capillary 127 (H) 70 - 99 mg/dL    Assessment & Plan: The plan of care was discussed with the bedside nurse for the day, who is in agreement with this plan and no additional concerns were raised.   Present on Admission: . Neuroendocrine neoplasm of gastrointestinal tract . Calculus of gallbladder with acute cholecystitis . Pancreas cancer ([place]) . Neuroendocrine tumor    LOS: 34 days   Additional comments:I reviewed the patient's new clinical lab test results.   and I reviewed the patients new imaging test results.    50 yo male 1 month s/p Whipple, right colectomy and SMV repair for T3N0 neuroendocrine tumor. C/b postop hemorrhagic shock with DIC and subsequent diffuse small bowel necrosis, now s/p small bowel resection with external drainage of bile duct, pancreatic duct and stomach. - Continue antibiotics - Blood cultures negative  to date - Acute renal failure: intermittent HD, nephrology following - Continue TPN. Will ultimately need tunneled line. - G tube to gravity - Continue trach collar as tolerated (did most of the day yesterday and vent overnight). Downsize to [facility] today. - Vac change MWF - Aggressive PT/OT - Minimal output from recently placed perc drain, plan for removal today - VTE: SQH - Dispo: ICU, communication ongoing by Dr. Zenia Resides with SB transplant program at [facility], MD Trauma & General Surgery Please use [email or web address] to contact on call provider  10/26/2020  *Care during the described time interval was provided by me. I have reviewed this patient's available data, including medical history, events of note, physical examination and test results as part of my evaluation.

## 2020-10-26 NOTE — Progress Notes (Signed)
[name] Progress Note     Assessment/ Plan:   1.Acute renal failure -likely has ATN With hypotensive episodes requiring Levophed in the setting of a very complicated surgical history with multiple visits to the OR + DIC + diffuse small bowel necrosis. Creatinine fluctuating represents compromised renal function with unfortunately repeated insults. No evidence of obstruction. continued purulent drainage from JP drain in abdomen.   - Pt and family desire all aggressive interventions, confirmed 10/16/20 -startedCRRT 10/18/20-10/20/20  -Continue dialysis maintaining a TTS schedule at this time; dialysis today off schedule - Will continue monitoring for any signs of renal recovery;  UOP present but still oliguric   2. Respiratory failure reintubated now on the ventilator--> s/p trach in OR 10/22/2020  3. Neuroendocrine tumor s/p Whipple with unfortunate extensive small bowel necrosis s/p resection. Family is hopeful for a small bowel transplant, however this is quite a long ways off d/t multiple issues. CT 4/6 showing possible pelvic abscess, had been on Zosyn  4. Severe malnutrition on TPN.  5. Anemia- Hgb 9.7.  6. Hypokalemia: continue w/ repletion as needed   Subjective:   Unfortunately dialysis was delayed yesterday.  Now getting dialysis this morning.  No complaints per patient    Objective:   BP (!) 119/93   Pulse 88   Temp 98.3 F (36.8 C) (Oral)   Resp 20   Ht 6\' 2"  (1.88 m)   Wt 95.1 kg   SpO2 96%   BMI 26.92 kg/m   Intake/Output Summary (Last 24 hours) at 10/26/2020 4765 Last data filed at 10/26/2020 0800 Gross per 24 hour  Intake 2644.42 ml  Output 1815 ml  Net 829.42 ml   Weight change:   Physical Exam: Gen: ill-appearing, lying in bed, no distress CVS: Normal rate Resp: coarse bilateral breath sounds, mild increased work of breathing Abd: Distention present, drains in place Ext: no edema in LE Access: RIJ temp  Imaging: No results  found.  Labs: BMET Recent Labs  Lab 10/23/20 1445 10/24/20 0456 10/24/20 1557 10/24/20 1820 10/25/20 0445 10/25/20 1716 10/26/20 0458  NA 135 134* QUESTIONABLE RESULTS, RECOMMEND RECOLLECT TO VERIFY 135 137 139 141  K 3.3* 3.0* QUESTIONABLE RESULTS, RECOMMEND RECOLLECT TO VERIFY 3.9 3.8 3.9 4.0  CL 93* 96* QUESTIONABLE RESULTS, RECOMMEND RECOLLECT TO VERIFY 97* 97* 99 100  CO2 27 29 QUESTIONABLE RESULTS, RECOMMEND RECOLLECT TO VERIFY 27 26 25 26   GLUCOSE 144* 137* QUESTIONABLE RESULTS, RECOMMEND RECOLLECT TO VERIFY 167* 162* 142* 168*  BUN 93* 26* QUESTIONABLE RESULTS, RECOMMEND RECOLLECT TO VERIFY 61* 87* 108* 131*  CREATININE 4.12* 1.59* QUESTIONABLE RESULTS, RECOMMEND RECOLLECT TO VERIFY 3.55* 4.64* 5.63* 6.62*  CALCIUM 8.5* 8.4* QUESTIONABLE RESULTS, RECOMMEND RECOLLECT TO VERIFY 8.0* 8.5* 8.4* 8.6*  PHOS 3.5 1.3* QUESTIONABLE RESULTS, RECOMMEND RECOLLECT TO VERIFY 5.0* 4.8* 4.9* 5.1*   CBC Recent Labs  Lab 10/20/20 0515 10/21/20 0511 10/22/20 0607 10/23/20 0552 10/24/20 0457  WBC 13.1* 19.2* 17.6* 15.2* 15.3*  NEUTROABS 9.3*  --   --   --   --   HGB 9.7* 10.3* 9.6* 9.3* 9.7*  HCT 30.0* 31.2* 31.0* 28.7* 29.8*  MCV 85.7 86.7 88.3 85.7 86.6  PLT 331 411* 361 334 351    Medications:    . chlorhexidine  15 mL Mouth Rinse BID  . Chlorhexidine Gluconate Cloth  6 each Topical Daily  . heparin injection (subcutaneous)  5,000 Units Subcutaneous Q8H  . insulin aspart  0-20 Units Subcutaneous Q4H  . insulin aspart  2  Units Subcutaneous Q4H  . mouth rinse  15 mL Mouth Rinse q12n4p  . pantoprazole (PROTONIX) IV  40 mg Intravenous BID  . sodium chloride flush  10-40 mL Intracatheter Q12H  . sodium chloride flush  5 mL Intracatheter Q8H      Reesa Chew  10/26/2020, 9:18 AM

## 2020-10-27 DIAGNOSIS — L0291 Cutaneous abscess, unspecified: Secondary | ICD-10-CM | POA: Diagnosis not present

## 2020-10-27 DIAGNOSIS — J9601 Acute respiratory failure with hypoxia: Secondary | ICD-10-CM | POA: Diagnosis not present

## 2020-10-27 LAB — COMPREHENSIVE METABOLIC PANEL
ALT: 125 U/L — ABNORMAL HIGH (ref 0–44)
AST: 112 U/L — ABNORMAL HIGH (ref 15–41)
Albumin: 1.3 g/dL — ABNORMAL LOW (ref 3.5–5.0)
Alkaline Phosphatase: 82 U/L (ref 38–126)
Anion gap: 13 (ref 5–15)
BUN: 81 mg/dL — ABNORMAL HIGH (ref 6–20)
CO2: 28 mmol/L (ref 22–32)
Calcium: 8.7 mg/dL — ABNORMAL LOW (ref 8.9–10.3)
Chloride: 99 mmol/L (ref 98–111)
Creatinine, Ser: 4.88 mg/dL — ABNORMAL HIGH (ref 0.61–1.24)
GFR, Estimated: 14 mL/min — ABNORMAL LOW (ref >60)
Glucose, Bld: 156 mg/dL — ABNORMAL HIGH (ref 70–99)
Potassium: 3.8 mmol/L (ref 3.5–5.1)
Sodium: 140 mmol/L (ref 135–145)
Total Bilirubin: 3.6 mg/dL — ABNORMAL HIGH (ref 0.3–1.2)
Total Protein: 7.4 g/dL (ref 6.5–8.1)

## 2020-10-27 LAB — DIFFERENTIAL
Abs Immature Granulocytes: 0.5 10*3/uL — ABNORMAL HIGH (ref 0.00–0.07)
Basophils Absolute: 0.1 10*3/uL (ref 0.0–0.1)
Basophils Relative: 0 %
Eosinophils Absolute: 0.3 10*3/uL (ref 0.0–0.5)
Eosinophils Relative: 3 %
Immature Granulocytes: 4 %
Lymphocytes Relative: 13 %
Lymphs Abs: 1.5 10*3/uL (ref 0.7–4.0)
Monocytes Absolute: 1.2 10*3/uL — ABNORMAL HIGH (ref 0.1–1.0)
Monocytes Relative: 11 %
Neutro Abs: 8 10*3/uL — ABNORMAL HIGH (ref 1.7–7.7)
Neutrophils Relative %: 69 %

## 2020-10-27 LAB — GLUCOSE, CAPILLARY
Glucose-Capillary: 135 mg/dL — ABNORMAL HIGH (ref 70–99)
Glucose-Capillary: 137 mg/dL — ABNORMAL HIGH (ref 70–99)
Glucose-Capillary: 145 mg/dL — ABNORMAL HIGH (ref 70–99)
Glucose-Capillary: 146 mg/dL — ABNORMAL HIGH (ref 70–99)
Glucose-Capillary: 149 mg/dL — ABNORMAL HIGH (ref 70–99)
Glucose-Capillary: 152 mg/dL — ABNORMAL HIGH (ref 70–99)

## 2020-10-27 LAB — CBC
HCT: 25.8 % — ABNORMAL LOW (ref 39.0–52.0)
Hemoglobin: 8.1 g/dL — ABNORMAL LOW (ref 13.0–17.0)
MCH: 27.4 pg (ref 26.0–34.0)
MCHC: 31.4 g/dL (ref 30.0–36.0)
MCV: 87.2 fL (ref 80.0–100.0)
Platelets: 244 10*3/uL (ref 150–400)
RBC: 2.96 MIL/uL — ABNORMAL LOW (ref 4.22–5.81)
RDW: 17.3 % — ABNORMAL HIGH (ref 11.5–15.5)
WBC: 11.6 10*3/uL — ABNORMAL HIGH (ref 4.0–10.5)
nRBC: 0 % (ref 0.0–0.2)

## 2020-10-27 LAB — MAGNESIUM: Magnesium: 2 mg/dL (ref 1.7–2.4)

## 2020-10-27 LAB — PHOSPHORUS: Phosphorus: 3.6 mg/dL (ref 2.5–4.6)

## 2020-10-27 LAB — PREALBUMIN: Prealbumin: 18.5 mg/dL (ref 18–38)

## 2020-10-27 MED ORDER — ZINC CHLORIDE 1 MG/ML IV SOLN
INTRAVENOUS | Status: AC
  Filled 2020-10-27: qty 1246.4

## 2020-10-27 MED ORDER — CHLORHEXIDINE GLUCONATE CLOTH 2 % EX PADS
6.0000 | MEDICATED_PAD | Freq: Every day | CUTANEOUS | Status: DC
  Administered 2020-10-27 – 2020-10-30 (×4): 6 via TOPICAL

## 2020-10-27 MED ORDER — FAT EMULSION PLANT BASED 20% (INTRALIPID)IV EMUL
250.0000 mL | INTRAVENOUS | Status: AC
  Administered 2020-10-27: 250 mL via INTRAVENOUS
  Filled 2020-10-27: qty 250

## 2020-10-27 NOTE — Progress Notes (Signed)
PHARMACY - TOTAL PARENTERAL NUTRITION CONSULT NOTE  Indication:  Short bowel syndrome  Patient Measurements: Height: 6\' 2"  (188 cm) Weight: 124.9 kg (275 lb 5.7 oz) IBW/kg (Calculated) : 82.2 TPN AdjBW (KG): 95.3 Body mass index is 35.35 kg/m.  Assessment:  69 YOM with history of mass adjacent to head of the pancreas in 2021. 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  Developed cholecystitis in Feb 2022 and had percutaneous cholecystectomy tube placement.  Presented this admission for neuroendocrine tumor resection with Whipple procedure, also had right total colectomy, colostomy and cholecystectomy on 09/30/2020. SMV was lacerated during procedure and repaired by bovine pericardial patch. Developed hemorrhagic shock with bile leak post-op and underwent placement of biliary T tube with abdominal washout on 3/15 PM. Abdomen remains open. Pharmacy consulted to manage TPN.  Glucose / Insulin: no hx DM, A1c 5.2%. CBGs mostly controlled. Utilized 27 units SSI in the past 24h hours, 60 units in TPN, Novolog 2 units Q4H (hold for CBG < 150).   Electrolytes: CoCa high normal at 10.5 (iCa 1.16 low normal on 4/7, calcium removed from TPN 4/16), Phos 3.6 (added low amount back to TPN 4/15), others WNL Renal: CRRT started 4/9 > 4/11, TTS HD schedule (4K baths), BUN 81 Hepatic: LFTs mildly elevated, tbili 3.6 (scleral yellowing still noted per RN 4/3) Albumin down 1.3, prealbumin 18.5 (4/11) TG peaked at 740, now at 328 on 4/14 - Intralipids 100g/wk and monitor TG trends Intake / Output; MIVF: UOP 0.2 ml/kg/hr, drains (5 total): 1662ml, 0cc from gastrostomy, net -4.9L  GI Imaging:  4/6 CT - pelvic fluid collection concerning for abscess 4/12 CT - no new fluid collections GI Surgeries / Procedures:  3/14 Whipple procedure with R hemicolectomy and end ileostomy, cholecystectomy 3/15 washout, VAC placement 3/17 washout, VAC replacement.  Necrosis of entire small bowel except  for the PB limb and proximal 40-50cm of jejunum 3/19 re-opening laparotomy, abd closure.  Necrotic SB. 3/21 SB resection, take down of choledochojejunal anastomosis and pancreaticojejunal anastomosis, take down of duodenojejunal anastomosis, placement of externalized biliary drain / Stamm gastrostomy tube, IA washout and placement of intraperitoneal drains 3/30 extubation 4/8 trach 4/12 left lateral abdominal drain placed by IR  Central access: R IJ CVC placed 3/15; changed to PICC placed 10/03/20 TPN start date: 09/28/2020  Nutritional Goals (RD rec on 4/14): 2800-3000 kCal, 185-210g AA, fluid >2L/day  Current Nutrition:  TPN  Plan:  - Continue concentrated TPN at 95 ml/hr - Intralipid 20% 278mL Mon/Thurs for now to prevent EFAD - TPN with Intralipid 2x/wk provides a daily average of 187g AA and 2869 kCal, meeting 100% of patient needs.  - Electrolytes in TPN: Continue Na 150mEq/L, Continue K 88mEq/L, remove Ca, continue Mag 30mEq/L, cont Phos 58mmol/L on 4/17, change Cl:Ac 1:1 - Daily multivitamin in TPN.  Remove standard trace elements and add back zinc 5mg , and selenium 91mcg.  Remove chromium while on RRT. - Continue resistant SSI Q4H, continue 60 units regular insulin in TPN per DM coordinator recommendation, Novolog 2 units SQ Q6H. - Renal function BID  Thank you for involving pharmacy in this patient's care.  Albertina Parr, PharmD., BCPS, BCCCP Clinical Pharmacist Please refer to [facility] for unit-specific pharmacist    **Pharmacist phone directory can be found on [email or web address] listed under [place]**

## 2020-10-27 NOTE — Progress Notes (Signed)
[name], MRN:  [id number], DOB:  01-07-1971, LOS: 69 ADMISSION DATE:  10/02/2020, CONSULTATION DATE:  10/27/20 REFERRING MD:  Anesthesia, CHIEF COMPLAINT:  Whipple, post-op shock   Brief History:  50 y.o. M with PMH of neuroendocrine tumor and underwent Whipple procedure 3/14 with intra-operative SMV laceration and repair by vascular with bovine pericardial patch.  Pt had worsening shock overnight and required three pressors despite massive transfusion protocol (received 150 blood products).  He was taken back to the OR for washout on 3/15 and returned to the ICU intubated.  Taken back 3/17 again found to have small bowel necrosis s/p resection.  Plans again to return 3/19 to OR.   Totality of small bowel has been resected. The case was discussed with [facility] who may consider eval for sm intestinal transplant pending clinical course.  Continuing aggressive supportive care.   Past Medical History:   has a past medical history of AICD (automatic cardioverter/defibrillator) present, Dyslipidemia, Heart failure with mildly reduced EF, History of kidney stones, HTN (hypertension), ventricular fibrillation, and Nonischemic cardiomyopathy.  Significant Hospital Events:  3/14: Admit to Surgery, to OR for whipple, SMV injury and repair, massive transfusion protocol overnight  3/15: Back to OR for washout, x2. IR for arteriogram. No major bleed source identified in OR, or arterial bleed w IR. Robust product resuscitation >75 products (+ TXA + novoseven) 3/16: off pressors, add'l 39 products overnight. Slowing resuscitation this morning with hemodynamic improvements and slowing drain output; OR x 2 - washout, biliary t tube, wound vac -- washout, wound vac, IR for arteriogram -- no arterial bleed for embolization 3/17:  Aggressive balanced transfusion continue overnight with marked hemodynamic improvement since yesterday evening. Off pressors x several hours, Wound vac output significantly slowing  (from 580m q15-316m to 50068m1hr+) and output is much thinner, Thin bloody secretions from mouth approx 250m17mecreased RR from 22 to 18, Unable to tolerate NE- severe bradycardia 3/19 taken back to OR. Small bowel noted to be almost completely necrosed. Patient was closed with plans for goals of car discussion.  3/20 GOC family endorses full scope of treatment. Primary team discussed with [facility] the possibility of transfer for small bowel transplant. [facility] felt as though transfer would not change outcome. 3/21 Ongoing discussion with family and [facility]. Patient may be a candidate for transplant if he can stabilize post a small bowel resection. He was taken back to the OR and small bowel was resected.  3/23 weaning pressors. Versed off. Remains encephalopathic 3/24 off pressors. Low dose dilaudid gtt -- only weakly grimaces to pain. Changing sedation to precedex + PRN fentanyl. Long discussion with 2 brothers regarding clinical case -- tried to clarify that while the term "stable" has been used, in this instance is meaning that he has not declined from previous shift but is in fact still critically ill with multisystem organ dysfunction.  3/25 Cr and BUN increased. Off sedation  3/26 Pressors off, CT head benign, BUN/Cr continue to creep up 3/27 Tachycardia/HTN overnight improved with fentanyl gtt 2/28 PCCM sign off. Trauma to take over vent/CCM needs.  3/30 extubated 4/2 desaturated overnight to the 30s improved with BVM and NTS.  4/5 poor airway protection. PCCM consulted for re-intubation.  4/6 CT a/p Large RLL opacity, smaller LLL opacity. Post op changes. 5.4cm fluid collection in posterior pelvis, concerning for abscess. Started on Zosyn  4/7 got transfused overnight and then diuresed. Worse renal function 4/9 transfused again. HD line inserted and started on  CRRT 4/10 started on trach collar trials. 4/12 off CRRT and transitioned to IHD. Surgery placed abdominal drain with minimal output--  during procedure seemed c/w hematoma however since grew out klebsiella  4/13, 4/14 Tolerated dialysis 4/15 Placed on vent overnight for rest 4/16 Tolerating trach collar 4/17 got HD. Trach dislodged overnight, replaced with #6 cuffless shiley.  4/18 afebrile, trach collar + PMV. Up to chair with PT   Consults:  PCCM VVS  IR  Palliative  Procedures:  PICC 3/25 > JP x 2  Biliary drain Pancreatic stent drain ETT 2/24>>3/13; 3/15 >> 3/29;  4/5>>4/8 Trach 4/8> L lateral abdominal drain 4/12>  Significant Diagnostic Tests:  See radiology tab Most recent significant imaging:  CT a/p  4/12> redemonstrated 7.6 x 6 x 2.6 cm fluid collection R lobe of liver. Redomonstrated pelvic fluid collection 4.9 x 4.9cm, 11 x 10.1 x 3.4 cm ill defined fluid collection v hematoma in abdominal mesentery. 4 x 3.2 x 2.2 cm ill defined fluid collection in ventral aspect of abdominal mesentery. Post op changes in setting of whipple, total small bowel resection, pancreatic stent, multiple drains.   Micro Data:  See micro tab Most recent significant findings:   4/5 trach asp >> Klebsiella pneumoniae 4/12 Pelvic abscess >> ampicillin resistant Klebsiella pneumoniae 4/12 BCx> no growth   Antimicrobials:  Zosyn 3/21 > 3/29; 4/5 >> Eraxis 3/23> 3/29, 4/12>  Interim History / Subjective:   Overnight trach was dislodged, replaced by RT. Has his PMV in place today and a pretty clear voice this morning  We talked about his illness course and current plan. This morning asking if he can get up to the chair  We also talked about his favorite Ramadan food -- dates + sweet milk   Cr this morning 4.88 BUN 81  Alk phos 82 AST 112 ALT 125  WBC 11.6 Hgb 8.1   Objective   Blood pressure 117/71, pulse 87, temperature 99.2 F (37.3 C), temperature source Oral, resp. rate 19, height 6' 2"  (1.88 m), weight 99.4 kg, SpO2 98 %.    Vent Mode: Stand-by FiO2 (%):  [28 %] 28 %   Intake/Output Summary (Last 24  hours) at 10/27/2020 0705 Last data filed at 10/27/2020 0600 Gross per 24 hour  Intake 2642.84 ml  Output 2040 ml  Net 602.84 ml   Filed Weights   10/26/20 0745 10/26/20 1145 10/27/20 0400  Weight: 95.1 kg 95.1 kg 99.4 kg   Physical Exam:  General: Ill appearing, middle aged M, reclined in bed, trach collar NAD HENT: [place]. Pink mm. #6 cuffless trach secure, PMV in place. Moderate tan secretions  Respiratory: Symmetrical chest expansion, even an unlabored on trach collar. Upper lobe rhonchi  Cardiovascular: rrr s1s2 no rgm  GI: Soft nd. Midline ound vac, drains in place.  Extremities: No acute joint deformity, no cyanosis or clubbing. No pitting edema. Symmetrically decreased muscle bulk  Neuro: AAOx3 following commands. [facility] Problem list   Hemorrhagic shock hypophosphatemia Septic shock  Klebsiella pneumoniae pneumonia  Assessment & Plan:    Acute hypoxemic respiratory failure requiring mechanical ventilation S/p Tracheostomy -aspiration PNA, improved -Klebsiella pneumoniae PNA, improved  P - Continue trach collar - Downsized to #6 shiley, cuffless  -routine trach care - pulm hygiene - PMV    Pancreatic neuroendocrine tumor s/p whipple and SMV repair 3/21. Post op course c/b hemorrhagic shock, small bowel necrosis s/p total small intestine resection -per CCS  -cont TPN. CCS plans for tunneled access -  Family's hope is for transfer to [facility] for small intestine transplant. Suspect still significant progress to be made with acute issues before candidacy evaluation could occur. Am not sure how pts underlying NICM will impact candidacy, or current renal failure (if no renal recovery)  Klebsiella pneumoniae pelvic abscess  -s/p drain placement 4/12 with IR  P -cont zosyn, eraxis   Acute renal failure with oliguria, requiring RRT -probably ATN -did requiring CRRT initially,  on iHD now -got HD 4/17 (was delayed 4/16 so required an off schedule run)   P -nephro following, plan for TTS iHD if tolerated  -monitoring for signs of renal recovery  -wonder if a urinal would be more dignified for the pt than a condom cath.   Malnutrition Short gut syndrome -in setting of total small bowel resection P -TPN dependent  Anemia, stable -critical illness, renal failure  P - follow CBC - Transfuse for hgb < 7.   NICM s/p ICD -has had improvement of LVEF on last echo  P -unable to do GDHFT given GI abnormalities   GOC - Full code except no shocks - The family hopes for small intestine transplant at [facility]. If not a feasible destination, would likely need to re-evaluate GOC - Pt would like to be aware/involved with decision making    I will reach out to [facility] today (4/18) to discuss if desired for PCCM to continue to follow.    Daily Goals Checklist  Pain/Anxiety/Delirium protocol (if indicated): PRN only for RASS goal 0 VAP protocol (if indicated): bundle in place.   DVT prophylaxis: SCD Nutrition Status: TPN  GI prophylaxis: Pantoprazole Urinary catheter: external Central lines: PICC 3/25 Glucose control: -SSI + TPN Mobility/therapy needs: PT Restraints: Restraint type:NA Reason for restraints:NA Daily labs- CMP Code Status: Partial - -no CV/DF (has an ICD however) Family Communication: per primary Disposition: ICU, hope is for CIR   Goals of Care:  Last date of multidisciplinary goals of care discussion: 14/5 Family and staff present: Dr. Zenia Resides Summary of discussion: Family wishes for aggressive care.   Code Status:  Partial - no CV/DF by [place] (has an ICD which is not to be turned off)    CCT: n/a   [name] MSN, AGACNP-BC [place] [phone] If no answer, [phone] 10/27/2020, 9:42 AM

## 2020-10-27 NOTE — Progress Notes (Addendum)
Occupational Therapy Treatment Patient Details Name: [name] MRN: [id number] DOB: 24-Aug-1970 Today's Date: 10/27/2020    History of present illness 50 yo male presenting 3/14 with neuroendocrine tumor of the head of the pancrease. s/p right hemicolectomy, end ileostomy, Whipple and patch angioplasty repair of SMV. postoperative hemorrhagic shock with DIC with extensive small bowel necrosis. All of small bowel has been resected, with external drainage of the bile duct, pancreas and stomach. Extubated 3/30. Reintubated 4/5. S/p tracheostomy 4/8. Started on CRRT 4/9. Started on trach collar trials 4/10.  Discontinued CRRT 4/11. PMH including AICD, heart failure, HTN, and dyslipidemia.   OT comments  Pt progressing towards established OT goals and demonstrating high motivation to participate in therapy. Pt performing bed mobility with Min A to elevate trunk into upright posture. Pt performing AROM exercises at BUE/BLEs while seated at EOB. Pt performing sit<>stand with Mod A +2; from EOB and then recliner x3 total. Pt requiring Max A +2 for pivot to recliner. VSS throughout. PVS on and encouraging pt to verbalize requests. Continue to highly recommend dc to CIR and will continue to follow acutely as admitted.    Follow Up Recommendations  CIR    Equipment Recommendations  Other (comment) (Defer to next venue)    Recommendations for Other Services PT consult;Speech consult;Rehab consult    Precautions / Restrictions Precautions Precautions: Fall Precaution Comments: Trach collar; x2 JP drains at abdomen and x2 biliary drains; wound vac       Mobility Bed Mobility Overal bed mobility: Needs Assistance Bed Mobility: Rolling;Sidelying to Sit Rolling: Min guard Sidelying to sit: Min assist;+2 for safety/equipment       General bed mobility comments: Min A to elevate trunk    Transfers Overall transfer level: Needs assistance Equipment used: 2 person hand held  assist Transfers: Sit to/from [facility] Sit to Stand: Mod assist;+2 physical assistance;+2 safety/equipment Stand pivot transfers: Max assist;+2 physical assistance;+2 safety/equipment       General transfer comment: Mod A +2 for power up into standing. Max A +2 for maintaining balance and blocking knees.    Balance Overall balance assessment: Needs assistance Sitting-balance support: No upper extremity supported;Feet supported Sitting balance-[name]: Fair Sitting balance - Comments: able to sit at EOB without support or UE   Standing balance support: Bilateral upper extremity supported;During functional activity Standing balance-[name]: Poor Standing balance comment: Max A +2 for maintaining standing; bilateral knees blocked                           ADL either performed or assessed with clinical judgement   ADL Overall ADL's : Needs assistance/impaired                         Toilet Transfer: Maximal assistance;+2 for safety/equipment;+2 for physical assistance;Stand-pivot (simulated to recliner) Toilet Transfer Details (indicate cue type and reason): Max A +2 for maintianing balance during pivot to recliner; bilateral knees blocked         Functional mobility during ADLs: Maximal assistance;+2 for physical assistance General ADL Comments: Focused session on OOB activity to recliner. Sitting at EOB, pt completed AROM exercises BUE/BLEs. Performing standing pivot with Max A +2     Vision       Perception     Praxis      Cognition Arousal/Alertness: Awake/alert Behavior During Therapy: Flat affect Overall Cognitive Status: Difficult to assess  General Comments: PMV in place during session; however, very little verbalization. Pt very motivated to participate in therapy and perform OOB activity. Pt following simple commands. At moments, pt with difficulty sequencing certain  motor movements or requiring cues to slow down and take his time to process.        Exercises Exercises: Other exercises Other Exercises Other Exercises: "punching forward" with forward shoulder flexion to ~90*; x5; seated at EOB   Shoulder Instructions       General Comments VSS; alternating between trach collar and RA with PMV on. BP stable.    Pertinent Vitals/ Pain       Pain Assessment: Faces Faces Pain Scale: Hurts a little bit Pain Location: grimacing with muscular fatigue and increased ROM Pain Descriptors / Indicators: Discomfort;Grimacing Pain Intervention(s): Monitored during session;Repositioned  Home Living                                          Prior Functioning/Environment              Frequency  Min 2X/week        Progress Toward Goals  OT Goals(current goals can now be found in the care plan section)  Progress towards OT goals: Progressing toward goals  Acute Rehab OT Goals Patient Stated Goal: Return to PLOF ADL Goals Pt Will Perform Grooming: with min assist;sitting;bed level Pt Will Perform Upper Body Dressing: with min assist;sitting Pt Will Perform Lower Body Dressing: with min assist;sit to/from stand Pt Will Transfer to Toilet: with min assist;with +2 assist;ambulating;bedside commode Additional ADL Goal #2: Pt will tolerate sitting at EOB for 10 minutes with Supervision in preparation for ADLs  Plan Discharge plan remains appropriate    Co-evaluation    PT/OT/SLP Co-Evaluation/Treatment: Yes Reason for Co-Treatment: For patient/therapist safety;To address functional/ADL transfers;Complexity of the patient's impairments (multi-system involvement)   OT goals addressed during session: ADL's and self-care      AM-PAC OT "6 Clicks" Daily Activity     Outcome Measure   Help from another person eating meals?: Total Help from another person taking care of personal grooming?: A Lot Help from another person  toileting, which includes using toliet, bedpan, or urinal?: A Lot Help from another person bathing (including washing, rinsing, drying)?: Total Help from another person to put on and taking off regular upper body clothing?: Total Help from another person to put on and taking off regular lower body clothing?: Total 6 Click Score: 8    End of Session Equipment Utilized During Treatment: Oxygen  OT Visit Diagnosis: Unsteadiness on feet (R26.81);Other abnormalities of gait and mobility (R26.89);Muscle weakness (generalized) (M62.81);Pain Pain - part of body:  (Generalized)   Activity Tolerance Patient tolerated treatment well   Patient Left in chair;with call bell/phone within reach   Nurse Communication Mobility status        Time: 6761-9509 OT Time Calculation (min): 25 min  Charges: OT General Charges $OT Visit: 1 Visit OT Treatments $Self Care/Home Management : 8-22 mins  [place], OTR/L Acute Rehab Pager: [phone] Office: [place] 10/27/2020, 12:45 PM

## 2020-10-27 NOTE — Progress Notes (Signed)
[facility] Progress Note     Assessment/ Plan:   1.Acute renal failure -likely has ATN With hypotensive episodes requiring Levophed in the setting of a very complicated surgical history with multiple visits to the OR + DIC + diffuse small bowel necrosis. Creatinine fluctuating represents compromised renal function with unfortunately repeated insults. No evidence of obstruction. Dark fluid in JP drains.   - Pt and family desire all aggressive interventions, confirmed 10/16/20 -startedCRRT 10/18/20-10/20/20  -Continue dialysis maintaining a TTS schedule at this time; had dialysis yesterday off schedule.  - Will continue monitoring for any signs of renal recovery;  UOP present but still oliguric with only 400 cc yesterday.  2. Respiratory failure reintubated now on the ventilator--> s/p trach in OR 10/16/2020  3. Neuroendocrine tumor s/p Whipple with unfortunate extensive small bowel necrosis s/p resection. Family is hopeful for a small bowel transplant, however this is quite a long ways off d/t multiple issues. CT 4/6 showing possible pelvic abscess, on Zosyn  4. Severe malnutrition on TPN.  5. Anemia- Hgb dropped to 8.1 today.   6.   Hypokalemia: Potassium normal today. Replete as needed   Subjective:   Pt received hemodialysis yesterday. No complaints today. His brother at the bedside.    Objective:   BP 103/64   Pulse 86   Temp 97.7 F (36.5 C) (Oral)   Resp 19   Ht 6\' 2"  (1.88 m)   Wt 99.4 kg   SpO2 96%   BMI 28.14 kg/m   Intake/Output Summary (Last 24 hours) at 10/27/2020 0913 Last data filed at 10/27/2020 0800 Gross per 24 hour  Intake 2642.19 ml  Output 2040 ml  Net 602.19 ml   Weight change: 0 kg  Physical Exam: GEN: sitting upright in chair, in no acute distress CV: regular rate and rhythm, no murmur, right IJ RESP: no increased work of breathing, trach collar in place ABD: soft, non-tender, JP drain MSK: no LE edema SKIN: warm,  dry NEURO: alert and oriented     Imaging: No results found.  Labs: BMET Recent Labs  Lab 10/24/20 1557 10/24/20 1820 10/25/20 0445 10/25/20 1716 10/26/20 0458 10/26/20 1600 10/27/20 0457  NA QUESTIONABLE RESULTS, RECOMMEND RECOLLECT TO VERIFY 135 137 139 141 138 140  K QUESTIONABLE RESULTS, RECOMMEND RECOLLECT TO VERIFY 3.9 3.8 3.9 4.0 3.6 3.8  CL QUESTIONABLE RESULTS, RECOMMEND RECOLLECT TO VERIFY 97* 97* 99 100 101 99  CO2 QUESTIONABLE RESULTS, RECOMMEND RECOLLECT TO VERIFY 27 26 25 26 28 28   GLUCOSE QUESTIONABLE RESULTS, RECOMMEND RECOLLECT TO VERIFY 167* 162* 142* 168* 143* 156*  BUN QUESTIONABLE RESULTS, RECOMMEND RECOLLECT TO VERIFY 61* 87* 108* 131* 54* 81*  CREATININE QUESTIONABLE RESULTS, RECOMMEND RECOLLECT TO VERIFY 3.55* 4.64* 5.63* 6.62* 3.57* 4.88*  CALCIUM QUESTIONABLE RESULTS, RECOMMEND RECOLLECT TO VERIFY 8.0* 8.5* 8.4* 8.6* 8.5* 8.7*  PHOS QUESTIONABLE RESULTS, RECOMMEND RECOLLECT TO VERIFY 5.0* 4.8* 4.9* 5.1* 2.8 3.6   CBC Recent Labs  Lab 10/22/20 0607 10/23/20 0552 10/24/20 0457 10/27/20 0457  WBC 17.6* 15.2* 15.3* 11.6*  NEUTROABS  --   --   --  8.0*  HGB 9.6* 9.3* 9.7* 8.1*  HCT 31.0* 28.7* 29.8* 25.8*  MCV 88.3 85.7 86.6 87.2  PLT 361 334 351 244    Medications:    . chlorhexidine  15 mL Mouth Rinse BID  . Chlorhexidine Gluconate Cloth  6 each Topical Q0600  . heparin injection (subcutaneous)  5,000 Units Subcutaneous Q8H  . insulin aspart  0-20 Units Subcutaneous  Q4H  . insulin aspart  2 Units Subcutaneous Q4H  . mouth rinse  15 mL Mouth Rinse q12n4p  . pantoprazole (PROTONIX) IV  40 mg Intravenous BID  . sodium chloride flush  10-40 mL Intracatheter Q12H  . sodium chloride flush  5 mL Intracatheter Q8H      Nikitha Mode, DO 10/27/2020, 9:13 AM

## 2020-10-27 NOTE — Progress Notes (Signed)
Referring Physician(s): [name],S  Supervising Physician: Dr. 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 Zenia Resides; complicated by hemorrhagic shockwith DIC and subsequent diffuse small bowel necrosis s/p small bowel resection 08/11/8655; further complication by intra-abdominal fluid collections/p left lateral abdominal drain placement in IR 10/21/2020. Patient laying in bed.    Allergies: Ace inhibitors  Medications:  Current Facility-Administered Medications:  .  0.9 %  sodium chloride infusion, 250 mL, Intravenous, Continuous, [name], MD, Last Rate: 20 mL/hr at 10/23/20 0825, 250 mL at 10/23/20 0825 .  0.9 %  sodium chloride infusion, 100 mL, Intravenous, PRN, Dwana Melena, MD .  0.9 %  sodium chloride infusion, 100 mL, Intravenous, PRN, Dwana Melena, MD .  alteplase (CATHFLO ACTIVASE) injection 2 mg, 2 mg, Intracatheter, Once PRN, Dwana Melena, MD .  [COMPLETED] anidulafungin (ERAXIS) 200 mg in sodium chloride 0.9 % 200 mL IVPB, 200 mg, Intravenous, Once, Stopped at 10/21/20 1411 **FOLLOWED BY** anidulafungin (ERAXIS) 100 mg in sodium chloride 0.9 % 100 mL IVPB, 100 mg, Intravenous, Q24H, Agarwala, Ravi, MD, Last Rate: 78 mL/hr at 10/27/20 0832, 100 mg at 10/27/20 0832 .  chlorhexidine (PERIDEX) 0.12 % solution 15 mL, 15 mL, Mouth Rinse, BID, [name], MD, 15 mL at 10/26/20 2145 .  Chlorhexidine Gluconate Cloth 2 % PADS 6 each, 6 each, Topical, Q0600, [name], MD .  fat emul(INTRAlipid) 20 % infusion 250 mL, 250 mL, Intravenous, Continuous TPN, Mancheril, Darnell Level, RPH .  fentaNYL (SUBLIMAZE) injection 50-100 mcg, 50-100 mcg, Intravenous, Q1H PRN, [name], MD, 50 mcg at 10/27/20 0432 .  heparin injection 1,000 Units, 1,000 Units, Dialysis, PRN, Dwana Melena, MD, 1,000 Units at 10/26/20  1033 .  heparin injection 5,000 Units, 5,000 Units, Subcutaneous, Q8H, Tyrone Apple, RPH, 5,000 Units at 10/27/20 8469 .  insulin aspart (novoLOG) injection 0-20 Units, 0-20 Units, Subcutaneous, Q4H, Bowser, [name], NP, 3 Units at 10/27/20 0831 .  insulin aspart (novoLOG) injection 2 Units, 2 Units, Subcutaneous, Q4H, Lavenia Atlas, RPH, 2 Units at 10/27/20 0831 .  ipratropium-albuterol (DUONEB) 0.5-2.5 (3) MG/3ML nebulizer solution 3 mL, 3 mL, Nebulization, Q6H PRN, Agarwala, Ravi, MD .  lactated ringers infusion, , Intravenous, Continuous, Bowser, [name], NP, Stopped at 10/03/2020 0706 .  lidocaine (PF) (XYLOCAINE) 1 % injection 5 mL, 5 mL, Intradermal, PRN, Dwana Melena, MD .  lidocaine-prilocaine (EMLA) cream 1 application, 1 application, Topical, PRN, Dwana Melena, MD .  MEDLINE mouth rinse, 15 mL, Mouth Rinse, q12n4p, [name], MD, 15 mL at 10/26/20 1735 .  midazolam (VERSED) injection 2 mg, 2 mg, Intravenous, Q15 min PRN, Corey Harold, NP, 2 mg at 10/14/20 1930 .  midazolam (VERSED) injection 2 mg, 2 mg, Intravenous, Q2H PRN, Corey Harold, NP, 2 mg at 10/22/20 0200 .  ondansetron (ZOFRAN) injection 4 mg, 4 mg, Intravenous, Q6H PRN, [name], MD, 4 mg at 10/27/20 0032 .  pantoprazole (PROTONIX) injection 40 mg, 40 mg, Intravenous, BID, Agarwala, Ravi, MD, 40 mg at 10/26/20 2144 .  pentafluoroprop-tetrafluoroeth (GEBAUERS) aerosol 1 application, 1 application, Topical, PRN, Dwana Melena, MD .  piperacillin-tazobactam (ZOSYN) IVPB 2.25 g, 2.25 g, Intravenous, Q8H, Alvira Philips, [place], Stopped at 10/27/20 0645 .  polyvinyl alcohol (LIQUIFILM TEARS) 1.4 % ophthalmic solution 1 drop, 1 drop, Both  Eyes, PRN, Donalynn Furlong, NP .  promethazine (PHENERGAN) 6.25 mg in sodium chloride 0.9 % 50 mL IVPB, 6.25 mg, Intravenous, Q6H PRN, [name], MD, Stopped at 10/27/20 0449 .  sodium chloride flush (NS) 0.9 % injection 10-40 mL, 10-40 mL, Intracatheter, Q12H,  [name], MD, 20 mL at 10/26/20 2145 .  sodium chloride flush (NS) 0.9 % injection 10-40 mL, 10-40 mL, Intracatheter, PRN, [name], MD, 10 mL at 10/20/20 0930 .  sodium chloride flush (NS) 0.9 % injection 5 mL, 5 mL, Intracatheter, Q8H, [name], MD, 5 mL at 10/27/20 [phone] .  TPN ADULT (ION), , Intravenous, Continuous TPN, Alvira Philips, [place], Last Rate: 95 mL/hr at 10/27/20 0800, Infusion Verify at 10/27/20 0800 .  TPN ADULT (ION), , Intravenous, Continuous TPN, Mancheril, Darnell Level, RPH    Vital Signs: BP 103/64   Pulse 86   Temp 97.7 F (36.5 C) (Oral)   Resp 19   Ht 6\' 2"  (1.88 m)   Wt 99.4 kg   SpO2 96%   BMI 28.14 kg/m   Physical Exam patient awake, nods yes or no to questions.  Left abdominal drain intact, insertion site okay, mildly tender, output still dark old bloody  Imaging: No results found.  Labs:  CBC: Recent Labs    10/22/20 0607 10/23/20 0552 10/24/20 0457 10/27/20 0457  WBC 17.6* 15.2* 15.3* 11.6*  HGB 9.6* 9.3* 9.7* 8.1*  HCT 31.0* 28.7* 29.8* 25.8*  PLT 361 334 351 244    COAGS: Recent Labs    09/10/2020 1228 09/14/2020 1947 09/24/20 0400 09/24/20 1650 09/09/2020 1909 09/10/2020 1624 10/21/20 1418  INR 1.5* 0.9   < > 1.4* 1.4* 1.4* 1.9*  APTT 37* 35  --  32 39*  --   --    < > = values in this interval not displayed.    BMP: Recent Labs    12/03/19 1703 05/24/20 1956 07/03/20 1643 07/21/20 0921 08/21/20 1037 10/25/20 1716 10/26/20 0458 10/26/20 1600 10/27/20 0457  NA 144   < > 142 142   < > 139 141 138 140  K 4.6   < > 3.7 4.2   < > 3.9 4.0 3.6 3.8  CL 109*   < > 103 104   < > 99 100 101 99  CO2 21   < > 25 27   < > 25 26 28 28   GLUCOSE 103*   < > 105* 83   < > 142* 168* 143* 156*  BUN 15   < > 18 16   < > 108* 131* 54* 81*  CALCIUM 9.5   < > 9.1 9.5   < > 8.4* 8.6* 8.5* 8.7*  CREATININE 1.27   < > 0.96 1.19   < > 5.63* 6.62* 3.57* 4.88*  GFRNONAA 66   < > 92 71   < > 12* 10* 20* 14*  GFRAA 77  --  107 82   --   --   --   --   --    < > = values in this interval not displayed.    LIVER FUNCTION TESTS: Recent Labs    10/21/20 0511 10/21/20 1756 10/22/20 0607 10/22/20 1634 10/23/20 0552 10/23/20 1445 10/25/20 1716 10/26/20 0458 10/26/20 1600 10/27/20 0457  BILITOT 4.0*  --  4.2*  --  4.4*  --   --   --   --  3.6*  AST 96*  --  86*  --  103*  --   --   --   --  112*  ALT 98*  --  96*  --  105*  --   --   --   --  125*  ALKPHOS 124  --  105  --  98  --   --   --   --  82  PROT 7.8  --  7.7  --  7.5  --   --   --   --  7.4  ALBUMIN 1.7*   < > 1.6*   < > 1.5*   < > 1.3* 1.3* 1.3* 1.3*   < > = values in this interval not displayed.    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 Zenia Resides; complicated by hemorrhagic shockwith DIC and subsequent diffuse small bowel necrosis s/p small bowel resection 08/02/5832; further complication by intra-abdominal fluid collections/p left lateral abdominal drain placement in IR 10/21/2020; cultures with Klebsiella Obtain follow-up CT once output diminishes or if clinical status worsens.   Electronically Signed: Ascencion Dike, PA-C 10/27/2020, 10:55 AM   I spent a total of 15 minutes at the the patient's bedside AND on the patient's hospital floor or unit, greater than 50% of which was counseling/coordinating care for left abdominal abscess drain

## 2020-10-27 NOTE — Progress Notes (Signed)
     10 Days Post-Op  Subjective: Afebrile, vitals stable. Has been on trach collar for over 48 hours, downsized to 6 cuffless.   Objective: Vital signs in last 24 hours: Temp:  [97.9 F (36.6 C)-99.6 F (37.6 C)] 99.2 F (37.3 C) (04/18 0400) Pulse Rate:  [25-101] 81 (04/18 0700) Resp:  [17-25] 19 (04/18 0812) BP: (82-124)/(52-93) 103/64 (04/18 0812) SpO2:  [83 %-100 %] 96 % (04/18 0700) FiO2 (%):  [28 %] 28 % (04/18 0812) Weight:  [95.1 kg-99.4 kg] 99.4 kg (04/18 0400) Last BM Date:  (pta)  Intake/Output from previous day: 04/17 0701 - 04/18 0700 In: 2787.2 [I.V.:2357.1; IV Piggyback:430.1] Out: 2040 [Urine:400; Drains:1640] Intake/Output this shift: No intake/output data recorded.  PE: General: no acute distress Neuro: alert, follows commands HEENT: trach in place, site clean and dry. Resp: normal work of breathing, on trach collar CV: RRR Abdomen: soft, nondistended, wound vac in place on midline incision. RLQ ostomy site is clean and granulating. JPx2 with dark fluid, appears to be old blood. Biliary drain with bile. LUQ pancreatic stent with clear colorless fluid. G tube in place to gravity LUQ.   Lab Results:  Recent Labs    10/27/20 0457  WBC 11.6*  HGB 8.1*  HCT 25.8*  PLT 244   BMET Recent Labs    10/26/20 1600 10/27/20 0457  NA 138 140  K 3.6 3.8  CL 101 99  CO2 28 28  GLUCOSE 143* 156*  BUN 54* 81*  CREATININE 3.57* 4.88*  CALCIUM 8.5* 8.7*   PT/INR No results for input(s): LABPROT, INR in the last 72 hours. CMP     Component Value Date/Time   NA 140 10/27/2020 0457   NA 142 07/21/2020 0921   K 3.8 10/27/2020 0457   CL 99 10/27/2020 0457   CO2 28 10/27/2020 0457   GLUCOSE 156 (H) 10/27/2020 0457   BUN 81 (H) 10/27/2020 0457   BUN 16 07/21/2020 0921   CREATININE 4.88 (H) 10/27/2020 0457   CREATININE 1.16 03/01/2016 1607   CALCIUM 8.7 (L) 10/27/2020 0457   PROT 7.4 10/27/2020 0457   PROT 6.7 07/21/2020 0921   ALBUMIN 1.3 (L)  10/27/2020 0457   ALBUMIN 4.1 07/21/2020 0921   AST 112 (H) 10/27/2020 0457   ALT 125 (H) 10/27/2020 0457   ALKPHOS 82 10/27/2020 0457   BILITOT 3.6 (H) 10/27/2020 0457   BILITOT 0.8 07/21/2020 0921   GFRNONAA 14 (L) 10/27/2020 0457   GFRNONAA >89 09/22/2015 1008   GFRAA 82 07/21/2020 0921   GFRAA >89 09/22/2015 1008   Lipase     Component Value Date/Time   LIPASE 23 08/22/2020 0318       Studies/Results: No results found.  Assessment/Plan 50 yo male 1 month s/p Whipple, right colectomy and SMV repair for T3N0 neuroendocrine tumor. C/b postop hemorrhagic shock with DIC and subsequent diffuse small bowel necrosis, now s/p small bowel resection with external drainage of bile duct, pancreatic duct and stomach. - Intermittent HD TThS - Continue TPN. Consult IR for tunneled line this week. - G tube to gravity - Continue trach collar as tolerated. PMSV trials. - Vac change MWF - Needs aggressive PT/OT for rehabilitation - VTE: SQH - Dispo: ICU. Tentatively plan for discharge to CIR. Referral to [facility] at discharge for transplant evaluation.    LOS: 35 days    Michaelle Birks, MD [facility] Surgery General, Hepatobiliary and Pancreatic Surgery 10/27/20 8:33 AM

## 2020-10-27 NOTE — Progress Notes (Signed)
Physical Therapy Treatment Patient Details Name: [name] MRN: [id number] DOB: April 16, 1971 Today's Date: 10/27/2020    History of Present Illness 50 yo male presenting 3/14 with neuroendocrine tumor of the head of the pancrease. s/p right hemicolectomy, end ileostomy, Whipple and patch angioplasty repair of SMV. postoperative hemorrhagic shock with DIC with extensive small bowel necrosis. All of small bowel has been resected, with external drainage of the bile duct, pancreas and stomach. Extubated 3/30. Reintubated 4/5. S/p tracheostomy 4/8. Started on CRRT 4/9. Started on trach collar trials 4/10.  Discontinued CRRT 4/11. PMH including AICD, heart failure, HTN, and dyslipidemia.    PT Comments    The pt made great progress with mobility this session, completing multiple sit-stand transfers and steps to recliner today. The pt remains eager to participate in therapy, highly motivated to progress with mobility. The pt requires modA of 2 to power up and steady as well as to steady with stepping in short bouts at this time. The pt tolerated well, required short seated rest, but all vitals remained stable at this time. Will continue to benefit from skilled PT to progress ambulation, stability, and endurance.    Follow Up Recommendations  CIR     Equipment Recommendations  Wheelchair (measurements PT);Wheelchair cushion (measurements PT);Hospital bed;Other (comment) (hoyer lift)    Recommendations for Other Services       Precautions / Restrictions Precautions Precautions: Fall Precaution Comments: Trach collar; x2 JP drains at abdomen and x2 biliary drains; wound vac Restrictions Weight Bearing Restrictions: No    Mobility  Bed Mobility Overal bed mobility: Needs Assistance Bed Mobility: Rolling;Sidelying to Sit Rolling: Min guard Sidelying to sit: Min assist;+2 for safety/equipment       General bed mobility comments: Min A to elevate trunk    Transfers Overall transfer  level: Needs assistance Equipment used: 2 person hand held assist Transfers: Sit to/from [facility] Sit to Stand: Mod assist;+2 physical assistance;+2 safety/equipment Stand pivot transfers: Max assist;+2 physical assistance;+2 safety/equipment       General transfer comment: Mod A +2 for power up into standing. Max A +2 for maintaining balance and blocking knees.  Ambulation/Gait Ambulation/Gait assistance: Mod assist;+2 physical assistance Gait Distance (Feet): 4 Feet Assistive device: 2 person hand held assist Gait Pattern/deviations: Step-to pattern;Decreased step length - right;Decreased step length - left;Trunk flexed;Narrow base of support Gait velocity: decreased   General Gait Details: pt taking small lateral steps from bed to recliner, modA of 2 to steady, block bilateral knees, facilitate at hips for steps, and to steady       Balance Overall balance assessment: Needs assistance Sitting-balance support: No upper extremity supported;Feet supported Sitting balance-[name]: Fair Sitting balance - Comments: able to sit at EOB without support or UE   Standing balance support: Bilateral upper extremity supported;During functional activity Standing balance-[name]: Poor Standing balance comment: Max A +2 for maintaining standing; bilateral knees blocked                            Cognition Arousal/Alertness: Awake/alert Behavior During Therapy: Flat affect Overall Cognitive Status: Difficult to assess                                 General Comments: PMV in place during session; however, very little verbalization. Pt very motivated to participate in therapy and perform OOB activity. Pt following simple commands.  At moments, pt with difficulty sequencing certain motor movements or requiring cues to slow down and take his time to process.      Exercises General Exercises - Lower Extremity Hip Flexion/Marching: AROM;Both;5  reps;Seated Heel Raises: AROM;Both;10 reps;Seated Other Exercises Other Exercises: "punching forward" with forward shoulder flexion to ~90*; x5; seated at EOB    General Comments General comments (skin integrity, edema, etc.): VSS; alternating between trach collar and RA with PMV on. BP stable.      Pertinent Vitals/Pain Pain Assessment: Faces Faces Pain Scale: Hurts a little bit Pain Location: grimacing with muscular fatigue and increased ROM Pain Descriptors / Indicators: Discomfort;Grimacing Pain Intervention(s): Limited activity within patient's tolerance;Monitored during session;Repositioned           PT Goals (current goals can now be found in the care plan section) Acute Rehab PT Goals Patient Stated Goal: Return to PLOF PT Goal Formulation: With patient Time For Goal Achievement: 11/10/20 Potential to Achieve Goals: Fair Progress towards PT goals: Progressing toward goals    Frequency    Min 3X/week      PT Plan Current plan remains appropriate    Co-evaluation PT/OT/SLP Co-Evaluation/Treatment: Yes Reason for Co-Treatment: Complexity of the patient's impairments (multi-system involvement);For patient/therapist safety;To address functional/ADL transfers PT goals addressed during session: Mobility/safety with mobility;Balance;Strengthening/ROM OT goals addressed during session: ADL's and self-care      AM-PAC PT "6 Clicks" Mobility   Outcome Measure  Help needed turning from your back to your side while in a flat bed without using bedrails?: A Lot Help needed moving from lying on your back to sitting on the side of a flat bed without using bedrails?: A Lot Help needed moving to and from a bed to a chair (including a wheelchair)?: Total Help needed standing up from a chair using your arms (e.g., wheelchair or bedside chair)?: Total Help needed to walk in hospital room?: Total Help needed climbing 3-5 steps with a railing? : Total 6 Click Score: 8    End  of Session Equipment Utilized During Treatment: Gait belt;Oxygen (trach collar and then RA, PMV) Activity Tolerance: Patient tolerated treatment well Patient left: with call bell/phone within reach;in chair Nurse Communication: Mobility status PT Visit Diagnosis: Muscle weakness (generalized) (M62.81);Difficulty in walking, not elsewhere classified (R26.2);Pain     Time: 7510-2585 PT Time Calculation (min) (ACUTE ONLY): 23 min  Charges:  $Gait Training: 8-22 mins                     Karma Ganja, PT, DPT   Acute Rehabilitation Department Pager #: [phone]   Otho Bellows 10/27/2020, 12:53 PM

## 2020-10-27 NOTE — Progress Notes (Signed)
10/27/2020  Case discussed with primary team -- PCCM will sign off. If patient's clinical status changes or if PCCM can be of further assistance, please re-engage.    [name] MSN, AGACNP-BC [place] for pager details 10/27/2020, 10:55 AM

## 2020-10-28 LAB — AEROBIC/ANAEROBIC CULTURE W GRAM STAIN (SURGICAL/DEEP WOUND)

## 2020-10-28 LAB — RENAL FUNCTION PANEL
Albumin: 1.3 g/dL — ABNORMAL LOW (ref 3.5–5.0)
Anion gap: 16 — ABNORMAL HIGH (ref 5–15)
BUN: 127 mg/dL — ABNORMAL HIGH (ref 6–20)
CO2: 25 mmol/L (ref 22–32)
Calcium: 8.7 mg/dL — ABNORMAL LOW (ref 8.9–10.3)
Chloride: 101 mmol/L (ref 98–111)
Creatinine, Ser: 6.76 mg/dL — ABNORMAL HIGH (ref 0.61–1.24)
GFR, Estimated: 9 mL/min — ABNORMAL LOW (ref >60)
Glucose, Bld: 187 mg/dL — ABNORMAL HIGH (ref 70–99)
Phosphorus: 4.1 mg/dL (ref 2.5–4.6)
Potassium: 3.9 mmol/L (ref 3.5–5.1)
Sodium: 142 mmol/L (ref 135–145)

## 2020-10-28 LAB — GLUCOSE, CAPILLARY
Glucose-Capillary: 122 mg/dL — ABNORMAL HIGH (ref 70–99)
Glucose-Capillary: 125 mg/dL — ABNORMAL HIGH (ref 70–99)
Glucose-Capillary: 130 mg/dL — ABNORMAL HIGH (ref 70–99)
Glucose-Capillary: 132 mg/dL — ABNORMAL HIGH (ref 70–99)
Glucose-Capillary: 145 mg/dL — ABNORMAL HIGH (ref 70–99)
Glucose-Capillary: 167 mg/dL — ABNORMAL HIGH (ref 70–99)

## 2020-10-28 MED ORDER — HEPARIN SODIUM (PORCINE) 1000 UNIT/ML DIALYSIS: 1000.0000 [IU] | INTRAMUSCULAR | Status: DC | PRN

## 2020-10-28 MED ORDER — ZINC CHLORIDE 1 MG/ML IV SOLN
INTRAVENOUS | Status: AC
  Filled 2020-10-28: qty 1246.4

## 2020-10-28 MED ORDER — SODIUM CHLORIDE 0.9 % IV SOLN: 100.0000 mL | INTRAVENOUS | Status: DC | PRN

## 2020-10-28 MED ORDER — LIDOCAINE-PRILOCAINE 2.5-2.5 % EX CREA
1.0000 "application " | TOPICAL_CREAM | CUTANEOUS | Status: DC | PRN
  Filled 2020-10-28: qty 5

## 2020-10-28 MED ORDER — ALTEPLASE 2 MG IJ SOLR: 2.0000 mg | Freq: Once | INTRAMUSCULAR | Status: DC | PRN

## 2020-10-28 MED ORDER — PENTAFLUOROPROP-TETRAFLUOROETH EX AERO: 1.0000 "application " | INHALATION_SPRAY | CUTANEOUS | Status: DC | PRN

## 2020-10-28 MED ORDER — HEPARIN SODIUM (PORCINE) 1000 UNIT/ML IJ SOLN
INTRAMUSCULAR | Status: AC
  Administered 2020-10-28: 2400 [IU] via INTRAVENOUS_CENTRAL
  Filled 2020-10-28: qty 3

## 2020-10-28 MED ORDER — LIDOCAINE HCL (PF) 1 % IJ SOLN: 5.0000 mL | INTRAMUSCULAR | Status: DC | PRN

## 2020-10-28 NOTE — Progress Notes (Addendum)
Inpatient Rehab Admissions Coordinator:   Met with patient at bedside to discuss potential CIR admission. Pt. Stated interest. Will pursue for potential admit  pending bed availability  medical readiness, and insurance authorization. Pt. Currently receiving intermittent HD. Ongoing need for HD will need to be determined prior to admission to CIR and if pt will need long-term HD, he will need to be clipped for outpatient HD.   Clemens Catholic, [place], [place] Admissions Coordinator  [phone] ([place]) [phone] (office)

## 2020-10-28 NOTE — Progress Notes (Signed)
PHARMACY - TOTAL PARENTERAL NUTRITION CONSULT NOTE  Indication:  Short bowel syndrome  Patient Measurements: Height: 6\' 2"  (188 cm) Weight: 124.9 kg (275 lb 5.7 oz) IBW/kg (Calculated) : 82.2 TPN AdjBW (KG): 95.3 Body mass index is 35.35 kg/m.  Assessment:  29 YOM with history of mass adjacent to head of the pancreas in 2021. 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  Developed cholecystitis in Feb 2022 and had percutaneous cholecystectomy tube placement.  Presented this admission for neuroendocrine tumor resection with Whipple procedure, also had right total colectomy, colostomy and cholecystectomy on 09/17/2020. SMV was lacerated during procedure and repaired by bovine pericardial patch. Developed hemorrhagic shock with bile leak post-op and underwent placement of biliary T tube with abdominal washout on 3/15 PM. Abdomen remains open. Pharmacy consulted to manage TPN.  Glucose / Insulin: no hx DM, A1c 5.2%. CBGs mostly controlled. Utilized 13 units SSI in the past 24h hours, 60 units in TPN, Novolog 2 units Q4H (hold for CBG < 150).   Electrolytes: CoCa high normal 10.9, Phos 4.1, others WNL Renal: CRRT started 4/9 > 4/11, now on iHD TTS (4K baths), BUN up 127 Hepatic: AST112/ALT 125/Tbili 3.6 (scleral yellowing still noted per RN 4/3) Albumin 1.3, prealbumin 18.5  TG peaked at 740, now at 328 on 4/14 - Intralipids 100g/wk and monitor TG trends Intake / Output; MIVF: UOP 0.1 ml/kg/hr, drains (5 total): 168ml, 0cc from gastrostomy, net -3.8L since admit    GI Imaging:  4/6 CT - pelvic fluid collection concerning for abscess 4/12 CT - no new fluid collections GI Surgeries / Procedures:  3/14 Whipple procedure with R hemicolectomy and end ileostomy, cholecystectomy 3/15 washout, VAC placement 3/17 washout, VAC replacement.  Necrosis of entire small bowel except for the PB limb and proximal 40-50cm of jejunum 3/19 re-opening laparotomy, abd closure.  Necrotic  SB. 3/21 SB resection, take down of choledochojejunal anastomosis and pancreaticojejunal anastomosis, take down of duodenojejunal anastomosis, placement of externalized biliary drain / Stamm gastrostomy tube, IA washout and placement of intraperitoneal drains 3/30 extubation 4/8 trach 4/12 left lateral abdominal drain placed by IR  Central access: R IJ CVC placed 3/15; changed to PICC placed 10/03/20 TPN start date: 10/01/2020  Nutritional Goals (RD rec on 4/14): 2800-3000 kCal, 185-210g AA, fluid >2L/day  Current Nutrition:  TPN  Plan:  - Continue concentrated TPN at 95 ml/hr - Intralipid 20% 280mL Mon/Thurs for now to prevent EFAD - TPN with Intralipid 2x/wk provides a daily average of 187g AA and 2869 kCal, meeting 100% of patient needs.  - Electrolytes in TPN: Na 159mEq/L, K 64mEq/L, remove Ca, Mag 12mEq/L, phos 36mmol/L,  cont Cl:Ac 1:1 - Daily multivitamin in TPN.  Remove standard trace elements and add back zinc 5mg , and selenium 23mcg.  Remove chromium while on RRT. - Continue resistant SSI Q4H,continue 60u insulin regular in TPN, Novolog 2 units SQ Q4H. - Renal function BID  Thank you for involving pharmacy in this patient's care.  Wilson Singer, PharmD PGY1 Pharmacy Resident 10/28/2020 10:11 AM

## 2020-10-28 NOTE — PMR Pre-admission (Shared)
PMR Admission Coordinator Pre-Admission Assessment  Patient: [name] is an 50 y.o., male MRN: [id number] DOB: September 09, 1970 Height: 6' 2"  (188 cm) Weight: 92.1 kg  Insurance Information HMO: ***    PPO: ***     PCP: ***     IPA: ***     80/20: ***     OTHER: *** PRIMARY: [employer] Managed      Policy#: [id number]      Subscriber: Pt.  CM Name: ***      Phone#: ***     Fax#: *** Pre-Cert#: ***      Employer: *** Benefits:  Phone #: ***     Name: *** Irene Shipper. Date: ***     Deduct: ***      Out of Pocket Max: ***      Life Max: *** CIR: ***      SNF: *** Outpatient: ***     Co-Pay: *** Home Health: ***      Co-Pay: *** DME: ***     Co-Pay: *** Providers: *** SECONDARY:none  Financial Counselor:       Phone#:   The "Data Collection Information Summary" for patients in Inpatient Rehabilitation Facilities with attached "Privacy Act [place] Records" was provided and verbally reviewed with: Patient  Emergency Contact Information Contact Information    Name Relation Home Work [place] [phone]  [phone]   Wynonia Lawman [phone]  [phone]   Hego,Sheref Friend [phone]     [place], abass Brother   [phone]      Current Medical History  Patient Admitting Diagnosis: Neuro-endocrine tumor s/p whipple  History of Present Illness:Pt.  is a 50 yo male with past medical history  Of past medical history of AICD (automatic cardioverter/defibrillator) present, Dyslipidemia, Heart failure with mildly reduced EF, History of kidney stones, HTN (hypertension), ventricular fibrillation, and Nonischemic cardiomyopathy. who was referred for evaluation of a neuroendocrine tumor. He presented in November 2021 with jaundice and abdominal pain and a CT scan showed a 4cm mass adjacent to the head of the pancreas. He underwent ERCP on 11/16, and sludge was swept from the bile duct. A sphincterotomy was performed. LFTs subsequently normalized. He was  discharged home and underwent an outpatient EUS on 06/25/20. This showed extrinsic compression of the third portion of the duodenum, with an adjacent 4cm mass that appeared to be mesenteric. FNA was performed and showed a neuroendocrine tumor. The patient was referred to surgeon for resection, but prior to scheduled resection he developed acute cholecystitis in February 200 He underwent placement of a perc cholecystostomy tube with subsequent resolution of his symptoms. Pt. was admitted to [facility] for surgery 10/06/2020 and underwent a patch angioplasty superior mesenteric vein with bovine pericardial patch with Dr. Oneida Alar, and Whipple procedure and cholecystectomy with Dr. Zenia Resides on 10/02/2020. Pt had worsening shock overnight and required three pressors despite massive transfusion protocol (received 150 blood products).  He was taken back to the OR for washout on 3/15 and returned to the ICU intubated.  Taken back 3/17 again found to have small bowel necrosis s/p resection. Totality of small bowel has been resected. Pt. trached 3/30, developed ARF and began CRRT 10/21/20, now transitioned to iHD.      Patient's medical record from [facility] has been reviewed by the rehabilitation admission coordinator and physician.  Past Medical History  Past Medical History:  Diagnosis Date  . AICD (automatic cardioverter/defibrillator) present   . Dyslipidemia   . Heart failure with mildly  reduced EF    EF 35-40 in 2011 // Echo 06/2019: EF 45-50, Gr 2 DD, RAE, mild MR, mild TR, mild PI, RVSP 33.6  . History of kidney stones   . HTN (hypertension)   . Hx of ventricular fibrillation    Hx of VFib arrest s/p AICD placement  . Nonischemic cardiomyopathy     Family History   family history includes Asthma in his maternal uncle; Diabetes in his father and maternal grandmother; Heart Problems in his father.  Prior Rehab/Hospitalizations Has the patient had prior rehab or hospitalizations prior to  admission? No  Has the patient had major surgery during 100 days prior to admission? No   Current Medications  Current Facility-Administered Medications:  .  0.9 %  sodium chloride infusion, 250 mL, Intravenous, Continuous, [name], MD, Last Rate: 20 mL/hr at 10/23/20 0825, 250 mL at 10/23/20 0825 .  0.9 %  sodium chloride infusion, 100 mL, Intravenous, PRN, Dwana Melena, MD .  0.9 %  sodium chloride infusion, 100 mL, Intravenous, PRN, Dwana Melena, MD .  0.9 %  sodium chloride infusion, 100 mL, Intravenous, PRN, [name], MD .  0.9 %  sodium chloride infusion, 100 mL, Intravenous, PRN, [name], MD .  alteplase (CATHFLO ACTIVASE) injection 2 mg, 2 mg, Intracatheter, Once PRN, [name], MD .  Margrett Rud anidulafungin (ERAXIS) 200 mg in sodium chloride 0.9 % 200 mL IVPB, 200 mg, Intravenous, Once, Stopped at 10/21/20 1411 **FOLLOWED BY** anidulafungin (ERAXIS) 100 mg in sodium chloride 0.9 % 100 mL IVPB, 100 mg, Intravenous, Q24H, Agarwala, Ravi, MD, Last Rate: 78 mL/hr at 10/28/20 1200, Infusion Verify at 10/28/20 1200 .  chlorhexidine (PERIDEX) 0.12 % solution 15 mL, 15 mL, Mouth Rinse, BID, Lovick, [name], MD, 15 mL at 10/28/20 1055 .  Chlorhexidine Gluconate Cloth 2 % PADS 6 each, 6 each, Topical, Q0600, [name], MD, 6 each at 10/28/20 1000 .  fentaNYL (SUBLIMAZE) injection 50-100 mcg, 50-100 mcg, Intravenous, Q1H PRN, [name], MD, 100 mcg at 10/28/20 8250 .  heparin injection 1,000 Units, 1,000 Units, Dialysis, PRN, Dwana Melena, MD, 1,000 Units at 10/26/20 1033 .  heparin injection 1,000 Units, 1,000 Units, Dialysis, PRN, [name], MD .  heparin injection 5,000 Units, 5,000 Units, Subcutaneous, Q8H, Dang, Thuy D, RPH, 5,000 Units at 10/28/20 5397 .  heparin sodium (porcine) 1000 UNIT/ML injection, , , ,  .  insulin aspart (novoLOG) injection 0-20 Units, 0-20 Units, Subcutaneous, Q4H, Bowser, [name], NP, 3 Units at 10/28/20 1248 .  insulin aspart  (novoLOG) injection 2 Units, 2 Units, Subcutaneous, Q4H, Mancheril, Darnell Level, RPH, 2 Units at 10/28/20 1248 .  ipratropium-albuterol (DUONEB) 0.5-2.5 (3) MG/3ML nebulizer solution 3 mL, 3 mL, Nebulization, Q6H PRN, Agarwala, Ravi, MD .  lidocaine (PF) (XYLOCAINE) 1 % injection 5 mL, 5 mL, Intradermal, PRN, [name], MD .  lidocaine-prilocaine (EMLA) cream 1 application, 1 application, Topical, PRN, [name], MD .  MEDLINE mouth rinse, 15 mL, Mouth Rinse, q12n4p, [name], MD, 15 mL at 10/28/20 1248 .  midazolam (VERSED) injection 2 mg, 2 mg, Intravenous, Q15 min PRN, Corey Harold, NP, 2 mg at 10/14/20 1930 .  midazolam (VERSED) injection 2 mg, 2 mg, Intravenous, Q2H PRN, Corey Harold, NP, 2 mg at 10/22/20 0200 .  ondansetron (ZOFRAN) injection 4 mg, 4 mg, Intravenous, Q6H PRN, [name], MD, 4 mg at 10/28/20 1058 .  pantoprazole (PROTONIX) injection 40 mg, 40  mg, Intravenous, BID, Agarwala, Ravi, MD, 40 mg at 10/28/20 1051 .  pentafluoroprop-tetrafluoroeth (GEBAUERS) aerosol 1 application, 1 application, Topical, PRN, [name], MD .  piperacillin-tazobactam (ZOSYN) IVPB 2.25 g, 2.25 g, Intravenous, Q8H, Alvira Philips, [place], Last Rate: 100 mL/hr at 10/28/20 1247, 2.25 g at 10/28/20 1247 .  polyvinyl alcohol (LIQUIFILM TEARS) 1.4 % ophthalmic solution 1 drop, 1 drop, Both Eyes, PRN, Donalynn Furlong, NP .  promethazine (PHENERGAN) 6.25 mg in sodium chloride 0.9 % 50 mL IVPB, 6.25 mg, Intravenous, Q6H PRN, [name], MD, Last Rate: 200 mL/hr at 10/28/20 1256, 6.25 mg at 10/28/20 1256 .  sodium chloride flush (NS) 0.9 % injection 10-40 mL, 10-40 mL, Intracatheter, Q12H, [name], MD, 10 mL at 10/28/20 1056 .  sodium chloride flush (NS) 0.9 % injection 10-40 mL, 10-40 mL, Intracatheter, PRN, [name], MD, 10 mL at 10/20/20 0930 .  sodium chloride flush (NS) 0.9 % injection 5 mL, 5 mL, Intracatheter, Q8H, [name], MD, 5 mL at 10/28/20 0634 .   TPN ADULT (ION), , Intravenous, Continuous TPN, Mancheril, Darnell Level, RPH, Last Rate: 95 mL/hr at 10/28/20 1200, Infusion Verify at 10/28/20 1200 .  TPN ADULT (ION), , Intravenous, Continuous TPN, Wilson Singer I, RPH  Patients Current Diet:  Diet Order            Diet NPO time specified  Diet effective now                 Precautions / Restrictions Precautions Precautions: Fall Precaution Comments: Trach collar; x2 JP drains at abdomen and x2 biliary drains; wound vac Restrictions Weight Bearing Restrictions: No   Has the patient had 2 or more falls or a fall with injury in the past year? Yes  Prior Activity Level  Pt. Was working and active PTA.  Prior Functional Level Self Care: Did the patient need help bathing, dressing, using the toilet or eating? Independent  Indoor Mobility: Did the patient need assistance with walking from room to room (with or without device)? Independent  Stairs: Did the patient need assistance with internal or external stairs (with or without device)? Independent  Functional Cognition: Did the patient need help planning regular tasks such as shopping or remembering to take medications? Independent  Home Assistive Devices / Equipment Home Assistive Devices/Equipment: Blood pressure cuff,Eyeglasses Home Equipment: None  Prior Device Use: Indicate devices/aids used by the patient prior to current illness, exacerbation or injury? None of the above  Current Functional Level Cognition  Overall Cognitive Status: Difficult to assess Difficult to assess due to: Tracheostomy Orientation Level: Oriented X4 General Comments: PMV in place during session; however, very little verbalization. Pt very motivated to participate in therapy and perform OOB activity. Pt following simple commands. At moments, pt with difficulty sequencing certain motor movements or requiring cues to slow down and take his time to process.    Extremity Assessment (includes  Sensation/Coordination)  Upper Extremity Assessment: RUE deficits/detail,LUE deficits/detail RUE Deficits / Details: Weakness. edema. Poor grasp. Able to reach and grasp younkuer off table placed at right side. RUE Coordination: decreased gross motor,decreased fine motor LUE Deficits / Details: Weakness. edema. Poor grasp. Noting shaking and tremors with fatigue LUE Coordination: decreased gross motor,decreased fine motor  Lower Extremity Assessment: Defer to PT evaluation    ADLs  Overall ADL's : Needs assistance/impaired Grooming: Wash/dry face,Moderate assistance,Sitting (bed in chair position) Grooming Details (indicate cue type and reason): Pt with difficulty bringing RUE to face  and requiring Min A for support elbow and bring to face. Pt then fatigues quickly and only wiping his eye gentle for a couple seconds. Requiring assistance to complete task fully Toilet Transfer: Maximal assistance,+2 for safety/equipment,+2 for physical assistance,Stand-pivot (simulated to recliner) Toilet Transfer Details (indicate cue type and reason): Max A +2 for maintianing balance during pivot to recliner; bilateral knees blocked Functional mobility during ADLs: Maximal assistance,+2 for physical assistance General ADL Comments: Focused session on OOB activity to recliner. Sitting at EOB, pt completed AROM exercises BUE/BLEs. Performing standing pivot with Max A +2    Mobility  Overal bed mobility: Needs Assistance Bed Mobility: Rolling,Sidelying to Sit Rolling: Min guard Sidelying to sit: Min assist,+2 for safety/equipment Supine to sit: Mod assist Sit to supine: Max assist,+2 for physical assistance General bed mobility comments: Min A to elevate trunk    Transfers  Overall transfer level: Needs assistance Equipment used: 2 person hand held assist Transfers: Sit to/from [employer] Sit to Stand: Mod assist,+2 physical assistance,+2 safety/equipment Stand pivot transfers: Max  assist,+2 physical assistance,+2 safety/equipment General transfer comment: Mod A +2 for power up into standing. Max A +2 for maintaining balance and blocking knees.    Ambulation / Gait / Stairs / Wheelchair Mobility  Ambulation/Gait Ambulation/Gait assistance: Mod assist,+2 physical assistance Gait Distance (Feet): 4 Feet Assistive device: 2 person hand held assist Gait Pattern/deviations: Step-to pattern,Decreased step length - right,Decreased step length - left,Trunk flexed,Narrow base of support General Gait Details: pt taking small lateral steps from bed to recliner, modA of 2 to steady, block bilateral knees, facilitate at hips for steps, and to steady Gait velocity: decreased    Posture / Balance Dynamic Sitting Balance Sitting balance - Comments: able to sit at EOB without support or UE Balance Overall balance assessment: Needs assistance Sitting-balance support: No upper extremity supported,Feet supported Sitting balance-[name]: Fair Sitting balance - Comments: able to sit at EOB without support or UE Postural control: Posterior lean,Right lateral lean Standing balance support: Bilateral upper extremity supported,During functional activity Standing balance-[name]: Poor Standing balance comment: Max A +2 for maintaining standing; bilateral knees blocked    Special needs/care consideration Dialysis: Hemodialysis Tuesday, Thursday and Saturday, Skin *** and Special service needs ***   Previous Home Environment (from acute therapy documentation) Living Arrangements: Spouse/significant other,Children  Lives With: Spouse Available Help at Discharge: Family,Available 24 hours/day Type of Home: House Home Layout: One level Home Access: Stairs to enter Entrance Stairs-Rails: None Entrance Stairs-Number of Steps: 3 Bathroom Shower/Tub: Chiropodist: Standard Home Care Services: No  Discharge Living Setting Plans for Discharge Living Setting:  Patient's home Type of Home at Discharge: House Discharge Home Layout: One level Discharge Home Access: Stairs to enter Entrance Stairs-Rails: None Entrance Stairs-Number of Steps: 3 Discharge Bathroom Shower/Tub: Tub/shower unit Discharge Bathroom Toilet: Standard Discharge Bathroom Accessibility: Yes How Accessible: Accessible via walker,Accessible via wheelchair Does the patient have any problems obtaining your medications?: No  Social/Family/Support Systems Patient Roles: Spouse Contact Information: [phone] Anticipated Caregiver: Lynnda Child (wife) Brothers can rotate to help as well Anticipated Ambulance person Information: [phone] Ability/Limitations of Caregiver: Family can provide Min A Caregiver Availability: 24/7 Discharge Plan Discussed with Primary Caregiver: Yes Is Caregiver In Agreement with Plan?: Yes Does Caregiver/Family have Issues with Lodging/Transportation while Pt is in Rehab?: Yes  Goals Patient/Family Goal for Rehab: PT/OT MIn A, SLP Supervision Expected length of stay: 21-24 days Pt/Family Agrees to Admission and willing to participate: Yes Program Orientation Provided &  Reviewed with Pt/Caregiver Including Roles  & Responsibilities: Yes  Decrease burden of Care through IP rehab admission: Specialzed equipment needs, Decrease number of caregivers, Bowel and bladder program and Patient/family education  Possible need for SNF placement upon discharge: not anticipated   Patient Condition: I have reviewed medical records from [facility], spoken with CM, and patient and family member. I met with patient at the bedside for inpatient rehabilitation assessment.  Patient will benefit from ongoing PT, OT and SLP, can actively participate in 3 hours of therapy a day 5 days of the week, and can make measurable gains during the admission.  Patient will also benefit from the coordinated team approach during an Inpatient Acute Rehabilitation  admission.  The patient will receive intensive therapy as well as Rehabilitation physician, nursing, social worker, and care management interventions.  Due to bladder management, bowel management, safety, skin/wound care, disease management, medication administration, pain management and patient education the patient requires 24 hour a day rehabilitation nursing.  The patient is currently *** with mobility and basic ADLs.  Discharge setting and therapy post discharge at [facility] IP discharge location:[id number]} is anticipated.  Patient has agreed to participate in the Acute Inpatient Rehabilitation Program and will admit {Time; today/tomorrow:10263}.  Preadmission Screen Completed By:  Genella Mech, 10/28/2020 2:37 PM ______________________________________________________________________   Discussed status with Dr. Marland Kitchen on *** at *** and received approval for admission today.  Admission Coordinator:  Genella Mech, CCC-SLP, time ***Sudie Grumbling ***   Assessment/Plan: Diagnosis: 1. Does the need for close, 24 hr/day Medical supervision in concert with the patient's rehab needs make it unreasonable for this patient to be served in a less intensive setting? {yes_no_potentially:3041433} 2. Co-Morbidities requiring supervision/potential complications: *** 3. Due to {due ZO:1096045}, does the patient require 24 hr/day rehab nursing? {yes_no_potentially:3041433} 4. Does the patient require coordinated care of a physician, rehab nurse, PT, OT, and SLP to address physical and functional deficits in the context of the above medical diagnosis(es)? {yes_no_potentially:3041433} Addressing deficits in the following areas: {deficits:3041436} 5. Can the patient actively participate in an intensive therapy program of at least 3 hrs of therapy 5 days a week? {yes_no_potentially:3041433} 6. The potential for patient to make measurable gains while on inpatient rehab is {potential:3041437} 7. Anticipated functional outcomes upon  discharge from inpatient rehab: {functional outcomes:304600100} PT, {functional outcomes:304600100} OT, {functional outcomes:304600100} SLP 8. Estimated rehab length of stay to reach the above functional goals is: *** 9. Anticipated discharge destination: {anticipated dc setting:21604} 10. Overall Rehab/Functional Prognosis: {potential:3041437}   MD Signature: ***

## 2020-10-28 NOTE — Progress Notes (Signed)
  Speech Language Pathology Treatment: Nada Boozer Speaking valve  Patient Details Name: [name] MRN: [id number] DOB: 02/06/1971 Today's Date: 10/28/2020 Time: 8616-8372 SLP Time Calculation (min) (ACUTE ONLY): 10 min  Assessment / Plan / Recommendation Clinical Impression  Pt is tolerating PMV quite well.  He was wearing valve upon entering room. Per chart and RN, he is beginning capping trials. VS are consistent with and without use of valve. Phonation is clear and of good quality; volume remains low but is functional, only occasional verbal cues needed for intelligibility. Pt is able to effectively eject secretions orally. Discussed status with RN.      HPI HPI: 25 yr with history of mass adjacent to head of the pancreas in 2021.  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  Developed cholecystitis in Feb 2022 and had percutaneous cholecystectomy tube placement.  Presented this admission for neuroendocrine tumor resection with Whipple procedure, also had right total colectomy, colostomy and cholecystectomy on 09/10/2020. SMV was lacerated during procedure and repaired by bovine pericardial patch. Developed hemorrhagic shock with bile leak post-op and underwent placement of biliary T tube with abdominal washout on 3/15 PM. All of small bowel has been resected, with external drainage of the bile duct, pancreas and stomach. Pt on TPN. intubated 3/14-3/30.      SLP Plan  Continue with current plan of care      Recommendations                 Oral Care Recommendations: Oral care QID Follow up Recommendations: Inpatient Rehab SLP Visit Diagnosis: Aphonia (R49.1) Plan: Continue with current plan of care       GO                Assunta Curtis 10/28/2020, 3:55 PM  Myrna Vonseggern L. Tivis Ringer, [place] Office number [phone] Pager [phone]

## 2020-10-28 NOTE — Progress Notes (Signed)
Pharmacy Antibiotic Note  [name] is a 50 y.o. male admitted on 09/19/2020 with pneumonia and intra-abdominal infection.  Pharmacy has been consulted for Zosyn dosing while on HD. Currently on D14 of Zosyn and D8 of anidulafungin.   He is currently afebrile and WBC overall down to 11.6. K pneumo in abscess sensitive to Zosyn  Plan: Continue Zosyn 2.25 gm IV Q 8 hours Anidulafungin per MD  Team considering 10 days of abx therapy from last CT   Height: 6\' 2"  (188 cm) Weight: 94.1 kg (207 lb 7.3 oz) IBW/kg (Calculated) : 82.2  Temp (24hrs), Avg:98.3 F (36.8 C), Min:97.8 F (36.6 C), Max:99.2 F (37.3 C)  Recent Labs  Lab 10/22/20 0607 10/22/20 1634 10/23/20 0552 10/23/20 1445 10/24/20 0457 10/24/20 1557 10/25/20 1716 10/26/20 0458 10/26/20 1600 10/27/20 0457 10/28/20 0455  WBC 17.6*  --  15.2*  --  15.3*  --   --   --   --  11.6*  --   CREATININE 4.06*   < > 3.32*   < >  --    < > 5.63* 6.62* 3.57* 4.88* 6.76*   < > = values in this interval not displayed.    Estimated Creatinine Clearance: 15.4 mL/min (A) (by C-G formula based on SCr of 6.76 mg/dL (H)).    Allergies  Allergen Reactions  . Ace Inhibitors Cough    Antimicrobials this admission: Vanc 3/18 >> 3/21 CTX 3/17 >> 3/21 Flagyl 3/17 >> 3/21 Zosyn 3/21 >> 3/30, restarted 4/5 >> Eraxis 3/22 >> 3/29, restarted 4/12 >>  Dose adjustments this admission: 3/19 VR 9 - 1750 mg q24  Microbiology results: 3/14 MRSA PCR - negative 3/23 BCx - negF 4/5 TA >> rare klebsiella   4/12 BCx - negF 4/12 Abscess - Kleb pneumo, holding for anaerobes  Albertina Parr, PharmD., BCPS, BCCCP Clinical Pharmacist Please refer to [facility] for unit-specific pharmacist    **Pharmacist phone directory can be found on [email or web address] listed under [place]**

## 2020-10-28 NOTE — Progress Notes (Addendum)
[facility] Progress Note     Assessment/ Plan:   1.Acute renal failure -likely has ATN With hypotensive episodes requiring Levophed in the setting of a very complicated surgical history with multiple visits to the OR + DIC + diffuse small bowel necrosis. Creatinine fluctuating represents compromised renal function with unfortunately repeated insults. No evidence of obstruction. Dark fluid in JP drain.   - Pt and family desire all aggressive interventions, confirmed 10/16/20 -startedCRRT 10/18/20-10/20/20  -Continue dialysis with intermittent HD on a  TTS schedule; will have dialysis today - Will continue monitoring for any signs of renal recovery;  UOP present but still oliguric with only 225 cc yesterday. Unsure if this is accurate as no urine reported during day shift.   2. Respiratory failure reintubated now on the ventilator--> s/p trach in OR 10/15/2020  3. Neuroendocrine tumor s/p Whipple with unfortunate extensive small bowel necrosis s/p resection. Family is hopeful for a small bowel transplant, however this is quite a long ways off d/t multiple issues. CT 4/6 showing possible pelvic abscess, on Zosyn  4. Severe malnutrition on TPN.  5. Anemia- most recent Hgb 8.1   6.   Hypokalemia: Potassium normal today. Replete as needed   Subjective:   No complaints today. He is aware that he is to have dialysis today.    Objective:   BP 105/64   Pulse 96   Temp 97.8 F (36.6 C) (Oral)   Resp 15   Ht 6\' 2"  (1.88 m)   Wt 94.1 kg   SpO2 95%   BMI 26.64 kg/m   Intake/Output Summary (Last 24 hours) at 10/28/2020 1003 Last data filed at 10/28/2020 0800 Gross per 24 hour  Intake 2923.55 ml  Output 1595 ml  Net 1328.55 ml   Weight change: -1 kg  Physical Exam:  GEN: laying in bed, in no acute distress  CV: regular rate and rhythm, right IJ, no murmur RESP: trach collar, no increased work of breathing ABD: soft, JP drain with dark fluid MSK: no LE  edema SKIN: warm, dry NEURO: alert and oriented     Imaging: No results found.  Labs: BMET Recent Labs  Lab 10/24/20 1820 10/25/20 0445 10/25/20 1716 10/26/20 0458 10/26/20 1600 10/27/20 0457 10/28/20 0455  NA 135 137 139 141 138 140 142  K 3.9 3.8 3.9 4.0 3.6 3.8 3.9  CL 97* 97* 99 100 101 99 101  CO2 27 26 25 26 28 28 25   GLUCOSE 167* 162* 142* 168* 143* 156* 187*  BUN 61* 87* 108* 131* 54* 81* 127*  CREATININE 3.55* 4.64* 5.63* 6.62* 3.57* 4.88* 6.76*  CALCIUM 8.0* 8.5* 8.4* 8.6* 8.5* 8.7* 8.7*  PHOS 5.0* 4.8* 4.9* 5.1* 2.8 3.6 4.1   CBC Recent Labs  Lab 10/22/20 0607 10/23/20 0552 10/24/20 0457 10/27/20 0457  WBC 17.6* 15.2* 15.3* 11.6*  NEUTROABS  --   --   --  8.0*  HGB 9.6* 9.3* 9.7* 8.1*  HCT 31.0* 28.7* 29.8* 25.8*  MCV 88.3 85.7 86.6 87.2  PLT 361 334 351 244    Medications:    . chlorhexidine  15 mL Mouth Rinse BID  . Chlorhexidine Gluconate Cloth  6 each Topical Q0600  . heparin injection (subcutaneous)  5,000 Units Subcutaneous Q8H  . insulin aspart  0-20 Units Subcutaneous Q4H  . insulin aspart  2 Units Subcutaneous Q4H  . mouth rinse  15 mL Mouth Rinse q12n4p  . pantoprazole (PROTONIX) IV  40 mg Intravenous BID  .  sodium chloride flush  10-40 mL Intracatheter Q12H  . sodium chloride flush  5 mL Intracatheter Q8H      Alyssabeth Bruster, DO 10/28/2020, 10:03 AM

## 2020-10-28 NOTE — Progress Notes (Signed)
Inpatient Rehabilitation Admissions Coordinator  I will place rehab consult per protocol.  Danne Baxter, RN, MSN Rehab Admissions Coordinator [phone] 10/28/2020 9:01 AM

## 2020-10-28 NOTE — Progress Notes (Addendum)
Referring Physician(s): Dr. Zenia Resides, S.  Supervising Physician: Ruthann Cancer  Patient Status:  [facility] - In-pt  Chief Complaint:  S/p left intraabdominal fluid drain placement in 10/21/20 with Dr. Pascal Lux.  Subjective:  Patient sitting in the bad with his family members and nurse at the bedside. States that he is doing better today.  Report nausea, no other abdominal complaints.  Remained afebrile overnight.   Allergies: Ace inhibitors  Medications: Prior to Admission medications   Medication Sig Start Date End Date Taking? Authorizing Provider  acetaminophen (TYLENOL) 500 MG tablet Take 2 tablets (1,000 mg total) by mouth every 6 (six) hours. Patient taking differently: Take 1,000 mg by mouth every 6 (six) hours as needed for mild pain. 08/22/20  Yes [name], MD  allopurinol (ZYLOPRIM) 100 MG tablet Take one tablet by mouth daily for 1 week and then 2 tablets by mouth daily Patient taking differently: Take 200 mg by mouth daily. 07/03/20  Yes Swords, Darrick Penna, MD  amiodarone (PACERONE) 200 MG tablet Take 1 tablet (200 mg total) by mouth daily. Patient taking differently: Take 200 mg by mouth daily with supper. 07/30/20  Yes Evans Lance, MD  aspirin 81 MG tablet Take 1 tablet (81 mg total) by mouth 2 (two) times daily. Patient taking differently: Take 81 mg by mouth daily. 09/22/15  Yes Funches, Josalyn, MD  bisoprolol (ZEBETA) 10 MG tablet Take 1 tablet by mouth once daily Patient taking differently: Take 10 mg by mouth daily. 08/25/20  Yes Evans Lance, MD  budesonide-formoterol [facility]) 80-4.5 MCG/ACT inhaler Inhale 2 puffs into the lungs 2 (two) times daily. Must keep upcoming office visit for refills Patient taking differently: Inhale 2 puffs into the lungs 2 (two) times daily as needed (for flares). 11/09/19  Yes Fulp, Cammie, MD  sacubitril-valsartan (ENTRESTO) 24-26 MG Take 1 tablet by mouth 2 (two) times daily. 06/25/20  Yes Evans Lance, MD  amiodarone  (PACERONE) 200 MG tablet TAKE 1 TABLET (200 MG TOTAL) BY MOUTH DAILY. 07/30/20 07/30/21  Evans Lance, MD  amiodarone (PACERONE) 200 MG tablet TAKE 1 TABLET (200 MG TOTAL) BY MOUTH DAILY. 11/15/19 11/14/20  Evans Lance, MD  bisoprolol (ZEBETA) 10 MG tablet TAKE 1 TABLET (10 MG TOTAL) BY MOUTH DAILY. 11/15/19 11/14/20  Evans Lance, MD  oxyCODONE (OXY IR/ROXICODONE) 5 MG immediate release tablet Take 1-2 tablets (5-10 mg total) by mouth every 6 (six) hours as needed for severe pain. Patient not taking: Reported on 09/10/2020 08/22/20   [name], MD     Vital Signs: BP 105/64   Pulse 95   Temp 97.8 F (36.6 C) (Oral)   Resp (!) 23   Ht 6\' 2"  (1.88 m)   Wt 207 lb 7.3 oz (94.1 kg)   SpO2 98%   BMI 26.64 kg/m   Physical Exam Vitals and nursing note reviewed.  Constitutional:      General: He is not in acute distress.    Appearance: He is ill-appearing.  Cardiovascular:     Rate and Rhythm: Normal rate.  Pulmonary:     Effort: Pulmonary effort is normal.  Abdominal:     Palpations: Abdomen is soft.     Comments: Positive left lateral drain to a suction bulb. Site is unremarkable with no erythema, edema, tenderness, bleeding or drainage. Suture and stat lock in place. Dressing is clean, dry, and intact. 25 ml of blood colored fluid noted in the suction bulb. Drain aspirates and  flushes well.   Skin:    General: Skin is warm and dry.  Neurological:     Mental Status: He is alert and oriented to person, place, and time.     Imaging: No results found.  Labs:  CBC: Recent Labs    10/22/20 0607 10/23/20 0552 10/24/20 0457 10/27/20 0457  WBC 17.6* 15.2* 15.3* 11.6*  HGB 9.6* 9.3* 9.7* 8.1*  HCT 31.0* 28.7* 29.8* 25.8*  PLT 361 334 351 244    COAGS: Recent Labs    10/04/2020 1228 09/14/2020 1947 09/24/20 0400 09/24/20 1650 10/02/2020 1909 09/28/2020 1624 10/21/20 1418  INR 1.5* 0.9   < > 1.4* 1.4* 1.4* 1.9*  APTT 37* 35  --  32 39*  --   --    < > = values in  this interval not displayed.    BMP: Recent Labs    12/03/19 1703 05/24/20 1956 07/03/20 1643 07/21/20 0921 08/21/20 1037 10/26/20 0458 10/26/20 1600 10/27/20 0457 10/28/20 0455  NA 144   < > 142 142   < > 141 138 140 142  K 4.6   < > 3.7 4.2   < > 4.0 3.6 3.8 3.9  CL 109*   < > 103 104   < > 100 101 99 101  CO2 21   < > 25 27   < > 26 28 28 25   GLUCOSE 103*   < > 105* 83   < > 168* 143* 156* 187*  BUN 15   < > 18 16   < > 131* 54* 81* 127*  CALCIUM 9.5   < > 9.1 9.5   < > 8.6* 8.5* 8.7* 8.7*  CREATININE 1.27   < > 0.96 1.19   < > 6.62* 3.57* 4.88* 6.76*  GFRNONAA 66   < > 92 71   < > 10* 20* 14* 9*  GFRAA 77  --  107 82  --   --   --   --   --    < > = values in this interval not displayed.    LIVER FUNCTION TESTS: Recent Labs    10/21/20 0511 10/21/20 1756 10/22/20 0607 10/22/20 1634 10/23/20 0552 10/23/20 1445 10/26/20 0458 10/26/20 1600 10/27/20 0457 10/28/20 0455  BILITOT 4.0*  --  4.2*  --  4.4*  --   --   --  3.6*  --   AST 96*  --  86*  --  103*  --   --   --  112*  --   ALT 98*  --  96*  --  105*  --   --   --  125*  --   ALKPHOS 124  --  105  --  98  --   --   --  82  --   PROT 7.8  --  7.7  --  7.5  --   --   --  7.4  --   ALBUMIN 1.7*   < > 1.6*   < > 1.5*   < > 1.3* 1.3* 1.3* 1.3*   < > = values in this interval not displayed.    Assessment and Plan: Neuroendocrine tumor of pancreass/p Whipple procedure with hemicolectomy in OR 09/18/2020 by Dr. Zenia Resides; complicated by hemorrhagic shockwith DIC and subsequent diffuse small bowel necrosis s/p small bowel resection 0/27/7412; further complication by intra-abdominal fluid collections/p left lateral abdominal drain placement in IR 10/21/2020; cultures with Klebsiella.   Drain aspirates and flushes well,  overnight OP 155 cc of blood colored fluid.   Continue with flushing TID, output recording q shift and dressing changes as needed. Would consider additional imaging when output is less than 10 ml for 24  hours not including flush material.    Further plan per CCS/nephrology/PCCM/SLP Appreciate and agree with the treatment plan IR to follow    Electronically Signed: Tera Mater, PA-C 10/28/2020, 11:13 AM   I spent a total of 15 Minutes at the the patient's bedside AND on the patient's hospital floor or unit, greater than 50% of which was counseling/coordinating care for left lateral intraabdominal fluid collection drain

## 2020-10-28 NOTE — Progress Notes (Signed)
11 Days Post-Op  Subjective: Afebrile, remains stable. Speaking valve in place this morning. No respiratory distress. Worked with PT/OT yesterday, got up to chair.  Objective: Vital signs in last 24 hours: Temp:  [97.7 F (36.5 C)-99.2 F (37.3 C)] 98.2 F (36.8 C) (04/19 0400) Pulse Rate:  [81-103] 92 (04/19 0600) Resp:  [15-29] 16 (04/19 0600) BP: (98-125)/(49-78) 108/65 (04/19 0600) SpO2:  [94 %-100 %] 96 % (04/19 0600) FiO2 (%):  [28 %] 28 % (04/19 0400) Weight:  [94.1 kg] 94.1 kg (04/19 0500) Last BM Date:  (pta)  Intake/Output from previous day: 04/18 0701 - 04/19 0700 In: 2770.9 [I.V.:2441; IV Piggyback:329.9] Out: 1845 [Urine:225; Drains:1620] Intake/Output this shift: Total I/O In: 1374.1 [I.V.:1274.2; IV Piggyback:99.9] Out: 865 [Urine:225; Drains:640]  PE: General: no acute distress Neuro: alert and oriented, no focal deficits HEENT: trach in place, site clean and dry. Resp: normal work of breathing on trach collar CV: RRR Abdomen: soft, nondistended, wound vac in place on midline incision. RLQ ostomy site is clean and granulating. R JPx2 with dark fluid, appears to be old blood. Biliary drain with bile. LUQ pancreatic stent with clear colorless fluid. LLQ perc drain with old blood. G tube in place to gravity LUQ.   Lab Results:  Recent Labs    10/27/20 0457  WBC 11.6*  HGB 8.1*  HCT 25.8*  PLT 244   BMET Recent Labs    10/27/20 0457 10/28/20 0455  NA 140 142  K 3.8 3.9  CL 99 101  CO2 28 25  GLUCOSE 156* 187*  BUN 81* 127*  CREATININE 4.88* 6.76*  CALCIUM 8.7* 8.7*   PT/INR No results for input(s): LABPROT, INR in the last 72 hours. CMP     Component Value Date/Time   NA 142 10/28/2020 0455   NA 142 07/21/2020 0921   K 3.9 10/28/2020 0455   CL 101 10/28/2020 0455   CO2 25 10/28/2020 0455   GLUCOSE 187 (H) 10/28/2020 0455   BUN 127 (H) 10/28/2020 0455   BUN 16 07/21/2020 0921   CREATININE 6.76 (H) 10/28/2020 0455   CREATININE  1.16 03/01/2016 1607   CALCIUM 8.7 (L) 10/28/2020 0455   PROT 7.4 10/27/2020 0457   PROT 6.7 07/21/2020 0921   ALBUMIN 1.3 (L) 10/28/2020 0455   ALBUMIN 4.1 07/21/2020 0921   AST 112 (H) 10/27/2020 0457   ALT 125 (H) 10/27/2020 0457   ALKPHOS 82 10/27/2020 0457   BILITOT 3.6 (H) 10/27/2020 0457   BILITOT 0.8 07/21/2020 0921   GFRNONAA 9 (L) 10/28/2020 0455   GFRNONAA >89 09/22/2015 1008   GFRAA 82 07/21/2020 0921   GFRAA >89 09/22/2015 1008   Lipase     Component Value Date/Time   LIPASE 23 08/22/2020 0318       Studies/Results: No results found.  Assessment/Plan 50 yo male 1 month s/p Whipple, right colectomy and SMV repair for T3N0 neuroendocrine tumor. C/b postop hemorrhagic shock with DIC and subsequent diffuse small bowel necrosis, now s/p small bowel resection with external drainage of bile duct, pancreatic duct and stomach. - Intermittent HD TThS. No recovery of renal function yet. - Continue TPN - G tube to gravity - Begin trach capping trials - Vac change MWF - Needs aggressive PT/OT for rehabilitation, patient is making progress with mobility. - VTE: [facility] - Dispo: ICU. Will discharge to CIR when medically ready.    LOS: 36 days    Michaelle Birks, MD [facility] Surgery General, Hepatobiliary and  Pancreatic Surgery 10/28/20 6:59 AM

## 2020-10-29 LAB — GLUCOSE, CAPILLARY
Glucose-Capillary: 140 mg/dL — ABNORMAL HIGH (ref 70–99)
Glucose-Capillary: 131 mg/dL — ABNORMAL HIGH (ref 70–99)
Glucose-Capillary: 139 mg/dL — ABNORMAL HIGH (ref 70–99)
Glucose-Capillary: 143 mg/dL — ABNORMAL HIGH (ref 70–99)
Glucose-Capillary: 153 mg/dL — ABNORMAL HIGH (ref 70–99)

## 2020-10-29 LAB — BASIC METABOLIC PANEL
Anion gap: 11 (ref 5–15)
BUN: 91 mg/dL — ABNORMAL HIGH (ref 6–20)
CO2: 26 mmol/L (ref 22–32)
Calcium: 8.6 mg/dL — ABNORMAL LOW (ref 8.9–10.3)
Chloride: 101 mmol/L (ref 98–111)
Creatinine, Ser: 5.64 mg/dL — ABNORMAL HIGH (ref 0.61–1.24)
GFR, Estimated: 12 mL/min — ABNORMAL LOW (ref >60)
Glucose, Bld: 134 mg/dL — ABNORMAL HIGH (ref 70–99)
Potassium: 4.3 mmol/L (ref 3.5–5.1)
Sodium: 138 mmol/L (ref 135–145)

## 2020-10-29 LAB — CBC
HCT: 24.6 % — ABNORMAL LOW (ref 39.0–52.0)
Hemoglobin: 7.4 g/dL — ABNORMAL LOW (ref 13.0–17.0)
MCH: 27 pg (ref 26.0–34.0)
MCHC: 30.1 g/dL (ref 30.0–36.0)
MCV: 89.8 fL (ref 80.0–100.0)
Platelets: 183 10*3/uL (ref 150–400)
RBC: 2.74 MIL/uL — ABNORMAL LOW (ref 4.22–5.81)
RDW: 17.1 % — ABNORMAL HIGH (ref 11.5–15.5)
WBC: 10.1 10*3/uL (ref 4.0–10.5)
nRBC: 0 % (ref 0.0–0.2)

## 2020-10-29 LAB — PHOSPHORUS: Phosphorus: 3.2 mg/dL (ref 2.5–4.6)

## 2020-10-29 MED ORDER — ZINC CHLORIDE 1 MG/ML IV SOLN
INTRAVENOUS | Status: AC
  Filled 2020-10-29: qty 1246.4

## 2020-10-29 MED ORDER — CHLORHEXIDINE GLUCONATE CLOTH 2 % EX PADS
6.0000 | MEDICATED_PAD | Freq: Every day | CUTANEOUS | Status: DC
  Administered 2020-10-29 – 2020-11-04 (×7): 6 via TOPICAL

## 2020-10-29 NOTE — Progress Notes (Signed)
Occupational Therapy Treatment Patient Details Name: [name] MRN: [id number] DOB: [birth year] Today's Date: 10/29/2020    History of present illness 50 yo male presenting 3/14 with neuroendocrine tumor of the head of the pancrease. s/p right hemicolectomy, end ileostomy, Whipple and patch angioplasty repair of SMV. postoperative hemorrhagic shock with DIC with extensive small bowel necrosis. All of small bowel has been resected, with external drainage of the bile duct, pancreas and stomach. Extubated 3/30. Reintubated 4/5. S/p tracheostomy 4/8. Started on CRRT 4/9. Started on trach collar trials 4/10.  Discontinued CRRT 4/11. PMH including AICD, heart failure, HTN, and dyslipidemia.   OT comments  Pt seen in conjunction with PT.  Pt currently requires mod A +2 for functional transfers.  He requires cues for hand placement and assist to steady.  He fatigues quickly with activity, but is very motivated to improve and to work with therapies.   Follow Up Recommendations  CIR    Equipment Recommendations  None recommended by OT    Recommendations for Other Services      Precautions / Restrictions Precautions Precautions: Fall Precaution Comments: Trach collar; x2 JP drains at abdomen and x2 biliary drains; wound vac Restrictions Weight Bearing Restrictions: No       Mobility Bed Mobility Overal bed mobility: Needs Assistance Bed Mobility: Rolling;Sidelying to Sit Rolling: Min guard Sidelying to sit: +2 for safety/equipment;Mod assist       General bed mobility comments: Cues for log roll technique and reaching right arm across to rail, modA for trunk to upright sitting position    Transfers Overall transfer level: Needs assistance Equipment used: Rolling walker (2 wheeled) Transfers: Sit to/from [facility] Sit to Stand: Mod assist;+2 physical assistance;Min assist Stand pivot transfers: +2 physical assistance;Mod assist       General transfer  comment: Min-modA + 2 to rise from edge of bed and recliner x 3 total. Pivoting over towards left side, bilateral knee instability noted. cues for upright posture, scapular retraction. Pt fatigues very easily    Balance Overall balance assessment: Needs assistance Sitting-balance support: No upper extremity supported;Feet supported Sitting balance-[name]: Fair Sitting balance - Comments: able to sit at EOB without support or UE   Standing balance support: Bilateral upper extremity supported;During functional activity Standing balance-[name]: Poor Standing balance comment: reliant on bilateral hand support                           ADL either performed or assessed with clinical judgement   ADL Overall ADL's : Needs assistance/impaired     Grooming: Wash/dry face;Minimal assistance;Sitting                   Toilet Transfer: Moderate assistance;+2 for physical assistance;+2 for safety/equipment;Stand-pivot;BSC;RW           Functional mobility during ADLs: Moderate assistance;+2 for physical assistance;+2 for safety/equipment       Vision       Perception     Praxis      Cognition Arousal/Alertness: Awake/alert Behavior During Therapy: WFL for tasks assessed/performed Overall Cognitive Status: Impaired/Different from baseline Area of Impairment: Problem solving;Following commands                       Following Commands: Follows multi-step commands inconsistently     Problem Solving: Difficulty sequencing;Requires verbal cues General Comments: Pt with improved cognition, motivated to participate, following one step commands. Cues provided for  sequencing motor tasks.        Exercises Exercises: Other exercises;General Lower Extremity General Exercises - Lower Extremity Ankle Circles/Pumps: Both;20 reps;Seated Other Exercises Other Exercises: worked on upright sitting with neutral pelvic tilt and thoracic extension x 3 with 5-10  second holds.  He fagtigues quickly   Shoulder Instructions       General Comments VSS    Pertinent Vitals/ Pain       Pain Assessment: Faces Faces Pain Scale: Hurts even more Pain Location: abdomen Pain Descriptors / Indicators: Discomfort;Grimacing Pain Intervention(s): Monitored during session;Limited activity within patient's tolerance;Repositioned  Home Living                                          Prior Functioning/Environment              Frequency  Min 2X/week        Progress Toward Goals  OT Goals(current goals can now be found in the care plan section)  Progress towards OT goals: Progressing toward goals  Acute Rehab OT Goals Patient Stated Goal: Return to [facility]  Plan Discharge plan remains appropriate    Co-evaluation    PT/OT/SLP Co-Evaluation/Treatment: Yes Reason for Co-Treatment: For patient/therapist safety;Complexity of the patient's impairments (multi-system involvement) PT goals addressed during session: Mobility/safety with mobility;Strengthening/ROM OT goals addressed during session: ADL's and self-care;Strengthening/ROM      AM-PAC OT "6 Clicks" Daily Activity     Outcome Measure   Help from another person eating meals?: None Help from another person taking care of personal grooming?: A Lot Help from another person toileting, which includes using toliet, bedpan, or urinal?: A Lot Help from another person bathing (including washing, rinsing, drying)?: A Lot Help from another person to put on and taking off regular upper body clothing?: Total Help from another person to put on and taking off regular lower body clothing?: Total 6 Click Score: 12    End of Session    OT Visit Diagnosis: Unsteadiness on feet (R26.81);Other abnormalities of gait and mobility (R26.89);Muscle weakness (generalized) (M62.81);Pain   Activity Tolerance Patient limited by fatigue   Patient Left in chair;with call bell/phone within  reach   Nurse Communication Mobility status        Time: 9924-2683 OT Time Calculation (min): 34 min  Charges: OT General Charges $OT Visit: 1 Visit OT Treatments $Therapeutic Activity: 8-22 mins  Nilsa Nutting OTR/L Acute Rehabilitation Services Pager [phone] Office [phone]    Lucille Passy M 10/29/2020, 2:40 PM

## 2020-10-29 NOTE — Progress Notes (Signed)
12 Days Post-Op  Subjective: Hemodynamically stable. Had HD yesterday, BMP pending this morning. Afebrile. Trach capped, patient is tolerating. RN reports he has a good cough and is clearing secretions well. Did not get out of bed yesterday.  Objective: Vital signs in last 24 hours: Temp:  [97.8 F (36.6 C)-98.8 F (37.1 C)] 98.8 F (37.1 C) (04/20 0400) Pulse Rate:  [90-104] 91 (04/20 0500) Resp:  [14-23] 15 (04/20 0500) BP: (85-113)/(58-76) 112/67 (04/20 0500) SpO2:  [87 %-100 %] 98 % (04/20 0500) FiO2 (%):  [28 %] 28 % (04/19 0800) Weight:  [90.9 kg-92.1 kg] 91.2 kg (04/20 0500) Last BM Date:  (pta)  Intake/Output from previous day: 04/19 0701 - 04/20 0700 In: 2424.5 [I.V.:2094.3; IV Piggyback:330.2] Out: 2760 [Urine:400; Drains:1360] Intake/Output this shift: Total I/O In: 1050.1 [I.V.:950.2; IV Piggyback:99.9] Out: 790 [Urine:150; Drains:640]  PE: General: no acute distress Neuro: alert and oriented, no focal deficits HEENT: trach capped Resp: normal work of breathing CV: RRR Abdomen: soft, nondistended, wound vac in place on midline incision. RLQ ostomy site is clean and granulating. R JP with dark red milky fluid, consistent with old hematoma and pancreatic fluid. Biliary drain with bile. LUQ pancreatic stent with clear colorless fluid. LLQ perc drain with old blood. G tube in place to gravity LUQ.   Lab Results:  Recent Labs    10/27/20 0457  WBC 11.6*  HGB 8.1*  HCT 25.8*  PLT 244   BMET Recent Labs    10/27/20 0457 10/28/20 0455  NA 140 142  K 3.8 3.9  CL 99 101  CO2 28 25  GLUCOSE 156* 187*  BUN 81* 127*  CREATININE 4.88* 6.76*  CALCIUM 8.7* 8.7*   PT/INR No results for input(s): LABPROT, INR in the last 72 hours. CMP     Component Value Date/Time   NA 142 10/28/2020 0455   NA 142 07/21/2020 0921   K 3.9 10/28/2020 0455   CL 101 10/28/2020 0455   CO2 25 10/28/2020 0455   GLUCOSE 187 (H) 10/28/2020 0455   BUN 127 (H) 10/28/2020  0455   BUN 16 07/21/2020 0921   CREATININE 6.76 (H) 10/28/2020 0455   CREATININE 1.16 03/01/2016 1607   CALCIUM 8.7 (L) 10/28/2020 0455   PROT 7.4 10/27/2020 0457   PROT 6.7 07/21/2020 0921   ALBUMIN 1.3 (L) 10/28/2020 0455   ALBUMIN 4.1 07/21/2020 0921   AST 112 (H) 10/27/2020 0457   ALT 125 (H) 10/27/2020 0457   ALKPHOS 82 10/27/2020 0457   BILITOT 3.6 (H) 10/27/2020 0457   BILITOT 0.8 07/21/2020 0921   GFRNONAA 9 (L) 10/28/2020 0455   GFRNONAA >89 09/22/2015 1008   GFRAA 82 07/21/2020 0921   GFRAA >89 09/22/2015 1008   Lipase     Component Value Date/Time   LIPASE 23 08/22/2020 0318       Studies/Results: No results found.  Assessment/Plan 50 yo male 1 month s/p Whipple, right colectomy and SMV repair for T3N0 neuroendocrine tumor. C/b postop hemorrhagic shock with DIC and subsequent diffuse small bowel necrosis, now s/p small bowel resection with external drainage of bile duct, pancreatic duct and stomach. - Intermittent HD TThS. No recovery of renal function yet. If patient continues on dialysis at discharge will need to have a tunneled HD catheter placed. - Continue TPN - G tube to gravity - Keep trach capped. If tolerating capping for >48 hours will decannulate. - Vac change MWF - Discontinue antibiotics - Needs aggressive PT/OT for rehabilitation,  patient is making progress with mobility. Needs to be out of bed to chair every day, discussed with nursing. - VTE: [facility] - Dispo: ICU. Will discharge to CIR when medically ready.    LOS: 18 days    Michaelle Birks, MD [facility] Surgery General, Hepatobiliary and Pancreatic Surgery 10/29/20 6:40 AM

## 2020-10-29 NOTE — Progress Notes (Signed)
Referring Physician(s): [name],S  Supervising Physician: Mir, Biochemist, clinical  Patient Status:  [facility] - In-pt  Chief Complaint:  Left abdominal fluid collection/abscess  Subjective: Pt sitting up in chair; family in room; no acute changes   Allergies: Ace inhibitors  Medications: Prior to Admission medications   Medication Sig Start Date End Date Taking? Authorizing Provider  acetaminophen (TYLENOL) 500 MG tablet Take 2 tablets (1,000 mg total) by mouth every 6 (six) hours. Patient taking differently: Take 1,000 mg by mouth every 6 (six) hours as needed for mild pain. 08/22/20  Yes [name], MD  allopurinol (ZYLOPRIM) 100 MG tablet Take one tablet by mouth daily for 1 week and then 2 tablets by mouth daily Patient taking differently: Take 200 mg by mouth daily. 07/03/20  Yes Swords, Darrick Penna, MD  amiodarone (PACERONE) 200 MG tablet Take 1 tablet (200 mg total) by mouth daily. Patient taking differently: Take 200 mg by mouth daily with supper. 07/30/20  Yes Evans Lance, MD  aspirin 81 MG tablet Take 1 tablet (81 mg total) by mouth 2 (two) times daily. Patient taking differently: Take 81 mg by mouth daily. 09/22/15  Yes Funches, Josalyn, MD  bisoprolol (ZEBETA) 10 MG tablet Take 1 tablet by mouth once daily Patient taking differently: Take 10 mg by mouth daily. 08/25/20  Yes Evans Lance, MD  budesonide-formoterol [facility]) 80-4.5 MCG/ACT inhaler Inhale 2 puffs into the lungs 2 (two) times daily. Must keep upcoming office visit for refills Patient taking differently: Inhale 2 puffs into the lungs 2 (two) times daily as needed (for flares). 11/09/19  Yes Fulp, Cammie, MD  sacubitril-valsartan (ENTRESTO) 24-26 MG Take 1 tablet by mouth 2 (two) times daily. 06/25/20  Yes Evans Lance, MD  amiodarone (PACERONE) 200 MG tablet TAKE 1 TABLET (200 MG TOTAL) BY MOUTH DAILY. 07/30/20 07/30/21  Evans Lance, MD  amiodarone (PACERONE) 200 MG tablet TAKE 1 TABLET (200 MG TOTAL) BY MOUTH  DAILY. 11/15/19 11/14/20  Evans Lance, MD  bisoprolol (ZEBETA) 10 MG tablet TAKE 1 TABLET (10 MG TOTAL) BY MOUTH DAILY. 11/15/19 11/14/20  Evans Lance, MD  oxyCODONE (OXY IR/ROXICODONE) 5 MG immediate release tablet Take 1-2 tablets (5-10 mg total) by mouth every 6 (six) hours as needed for severe pain. Patient not taking: Reported on 09/10/2020 08/22/20   [name], MD     Vital Signs: BP 113/72   Pulse 88   Temp 98.3 F (36.8 C) (Oral)   Resp 20   Ht 6\' 2"  (1.88 m)   Wt 201 lb 1 oz (91.2 kg)   SpO2 96%   BMI 25.81 kg/m   Physical Exam awake/alert; left IR abd drain intact, OP 300+ cc turbid, bloody fluid; insertion site mildly tender  Imaging: No results found.  Labs:  CBC: Recent Labs    10/23/20 0552 10/24/20 0457 10/27/20 0457 10/29/20 0652  WBC 15.2* 15.3* 11.6* 10.1  HGB 9.3* 9.7* 8.1* 7.4*  HCT 28.7* 29.8* 25.8* 24.6*  PLT 334 351 244 183    COAGS: Recent Labs    10/03/2020 1228 09/17/2020 1947 09/24/20 0400 09/24/20 1650 09/19/2020 1909 09/30/2020 1624 10/21/20 1418  INR 1.5* 0.9   < > 1.4* 1.4* 1.4* 1.9*  APTT 37* 35  --  32 39*  --   --    < > = values in this interval not displayed.    BMP: Recent Labs    12/03/19 1703 05/24/20 1956 07/03/20 1643 07/21/20 8144  08/21/20 1037 10/26/20 1600 10/27/20 0457 10/28/20 0455 10/29/20 0652  NA 144   < > 142 142   < > 138 140 142 138  K 4.6   < > 3.7 4.2   < > 3.6 3.8 3.9 4.3  CL 109*   < > 103 104   < > 101 99 101 101  CO2 21   < > 25 27   < > 28 28 25 26   GLUCOSE 103*   < > 105* 83   < > 143* 156* 187* 134*  BUN 15   < > 18 16   < > 54* 81* 127* 91*  CALCIUM 9.5   < > 9.1 9.5   < > 8.5* 8.7* 8.7* 8.6*  CREATININE 1.27   < > 0.96 1.19   < > 3.57* 4.88* 6.76* 5.64*  GFRNONAA 66   < > 92 71   < > 20* 14* 9* 12*  GFRAA 77  --  107 82  --   --   --   --   --    < > = values in this interval not displayed.    LIVER FUNCTION TESTS: Recent Labs    10/21/20 0511 10/21/20 1756 10/22/20 0607  10/22/20 1634 10/23/20 0552 10/23/20 1445 10/26/20 0458 10/26/20 1600 10/27/20 0457 10/28/20 0455  BILITOT 4.0*  --  4.2*  --  4.4*  --   --   --  3.6*  --   AST 96*  --  86*  --  103*  --   --   --  112*  --   ALT 98*  --  96*  --  105*  --   --   --  125*  --   ALKPHOS 124  --  105  --  98  --   --   --  82  --   PROT 7.8  --  7.7  --  7.5  --   --   --  7.4  --   ALBUMIN 1.7*   < > 1.6*   < > 1.5*   < > 1.3* 1.3* 1.3* 1.3*   < > = values in this interval not displayed.    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 Zenia Resides; complicated by hemorrhagic shockwith DIC and subsequent diffuse small bowel necrosis s/p small bowel resection 9/44/9675; further complicated by intra-abdominal fluid collection,s/p left lateral abdominal drain placement in IR 10/21/2020; cultures with Klebsiella, afebrile; WBC nl; hgb 7.4(8.1), creat 5.64(6.76), K 4.3; continue current treatment, monitor output/labs closely; obtain follow-up CT once output diminishes or if clinical status worsens  Electronically Signed: D. Rowe Robert, PA-C 10/29/2020, 11:08 AM   I spent a total of 15 minutes at the the patient's bedside AND on the patient's hospital floor or unit, greater than 50% of which was counseling/coordinating care for left abdominal abscess drain    Patient ID: [name], male   DOB: 11-07-1970, 50 y.o.   MRN: [id number]

## 2020-10-29 NOTE — Progress Notes (Signed)
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 s/p right hemicolectomy, end ileostomy, Whipple and patch angioplasty repair of SMV. postoperative hemorrhagic shock with DIC with extensive small bowel necrosis. All of small bowel has been resected, with external drainage of the bile duct, pancreas and stomach. Extubated 3/30. Reintubated 4/5. S/p tracheostomy 4/8. Started on CRRT 4/9. Started on trach collar trials 4/10.  Discontinued CRRT 4/11. PMH including AICD, heart failure, HTN, and dyslipidemia.    PT Comments    Pt progressing well towards his physical therapy goals; remains very motivated to participate. Session focused on sit to stands for functional strength training with emphasis on safe transitions and upright posture. Pt able to take pivotal steps from bed to chair, but limited in further distance due to fatigue and low endurance. Pt requiring frequent rest breaks, however, still eager to participate and push himself to perform. Highly recommend CIR to address deficits and maximize functional mobility.     Follow Up Recommendations  CIR     Equipment Recommendations  Wheelchair (measurements PT);Wheelchair cushion (measurements PT);Hospital bed;Rolling walker with 5" wheels;3in1 (PT)    Recommendations for Other Services       Precautions / Restrictions Precautions Precautions: Fall Precaution Comments: Trach collar; x2 JP drains at abdomen and x2 biliary drains; wound vac Restrictions Weight Bearing Restrictions: No    Mobility  Bed Mobility Overal bed mobility: Needs Assistance Bed Mobility: Rolling;Sidelying to Sit Rolling: Min guard Sidelying to sit: +2 for safety/equipment;Mod assist       General bed mobility comments: Cues for log roll technique and reaching right arm  across to rail, modA for trunk to upright sitting position    Transfers Overall transfer level: Needs assistance Equipment used: Rolling walker (2 wheeled) Transfers: Sit to/from [facility] Sit to Stand: Mod assist;+2 physical assistance;Min assist Stand pivot transfers: +2 physical assistance;Mod assist       General transfer comment: Min-modA + 2 to rise from edge of bed and recliner x 3 total. Pivoting over towards left side, bilateral knee instability noted. cues for upright posture, scapular retraction. Pt fatigues very easily  Ambulation/Gait                 Stairs             Wheelchair Mobility    Modified Rankin (Stroke Patients Only)       Balance Overall balance assessment: Needs assistance Sitting-balance support: No upper extremity supported;Feet supported Sitting balance-[name]: Fair Sitting balance - Comments: able to sit at EOB without support or UE   Standing balance support: Bilateral upper extremity supported;During functional activity Standing balance-[name]: Poor Standing balance comment: reliant on bilateral hand support                            Cognition Arousal/Alertness: Awake/alert Behavior During Therapy: WFL for tasks assessed/performed Overall Cognitive Status: Impaired/Different from baseline Area of Impairment: Problem solving;Following commands                       Following Commands: Follows multi-step commands inconsistently     Problem Solving: Difficulty sequencing;Requires verbal cues General Comments: Pt with improved cognition, motivated to participate, following one step commands. Cues provided for sequencing motor tasks.  Exercises General Exercises - Lower Extremity Ankle Circles/Pumps: Both;20 reps;Seated Other Exercises Other Exercises: x2 scapular retractions x 10 s each, x 3 sit to stands    General Comments        Pertinent Vitals/Pain Pain  Assessment: Faces Faces Pain Scale: Hurts even more Pain Location: abdomen Pain Descriptors / Indicators: Discomfort;Grimacing Pain Intervention(s): Limited activity within patient's tolerance;Monitored during session    Home Living                      Prior Function            PT Goals (current goals can now be found in the care plan section) Acute Rehab PT Goals Patient Stated Goal: Return to PLOF PT Goal Formulation: With patient Time For Goal Achievement: 11/10/20 Potential to Achieve Goals: Fair Progress towards PT goals: Progressing toward goals    Frequency    Min 3X/week      PT Plan Current plan remains appropriate    Co-evaluation PT/OT/SLP Co-Evaluation/Treatment: Yes Reason for Co-Treatment: Complexity of the patient's impairments (multi-system involvement);For patient/therapist safety;To address functional/ADL transfers PT goals addressed during session: Mobility/safety with mobility;Strengthening/ROM        AM-PAC PT "6 Clicks" Mobility   Outcome Measure  Help needed turning from your back to your side while in a flat bed without using bedrails?: A Little Help needed moving from lying on your back to sitting on the side of a flat bed without using bedrails?: A Lot Help needed moving to and from a bed to a chair (including a wheelchair)?: A Lot Help needed standing up from a chair using your arms (e.g., wheelchair or bedside chair)?: A Lot Help needed to walk in hospital room?: Total Help needed climbing 3-5 steps with a railing? : Total 6 Click Score: 11    End of Session   Activity Tolerance: Patient tolerated treatment well Patient left: with call bell/phone within reach;in chair Nurse Communication: Mobility status PT Visit Diagnosis: Muscle weakness (generalized) (M62.81);Difficulty in walking, not elsewhere classified (R26.2);Pain     Time: 4580-9983 PT Time Calculation (min) (ACUTE ONLY): 36 min  Charges:  $Therapeutic  Activity: 8-22 mins                     Wyona Almas, PT, DPT Acute Rehabilitation Services Pager [phone] Office [phone]    Deno Etienne 10/29/2020, 1:08 PM

## 2020-10-29 NOTE — Progress Notes (Signed)
PHARMACY - TOTAL PARENTERAL NUTRITION CONSULT NOTE  Indication:  Short bowel syndrome  Patient Measurements: Height: 6\' 2"  (188 cm) Weight: 124.9 kg (275 lb 5.7 oz) IBW/kg (Calculated) : 82.2 TPN AdjBW (KG): 95.3 Body mass index is 35.35 kg/m.  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  Developed cholecystitis in Feb 2022 and had percutaneous cholecystectomy tube placement.  Presented this admission for neuroendocrine tumor resection with Whipple procedure, also had right total colectomy, colostomy and cholecystectomy on 09/21/2020. SMV was lacerated during procedure and repaired by bovine pericardial patch. Developed hemorrhagic shock with bile leak post-op and underwent placement of biliary T tube with abdominal washout on 3/15 PM. Antimicrobial therapies discontinued 4/20.  Pharmacy consulted to manage TPN.  Glucose / Insulin: no hx DM, A1c 5.2%. CBGs mostly controlled. Utilized 14 units SSI in the past 24h hours, 60 units in TPN, Novolog 2 units Q4H (hold for CBG < 150).   Electrolytes: CoCa high normal 10.8, others WNL Renal: CRRT started 4/9 > 4/11, now on iHD TTS (4K baths), BUN up 127 Hepatic: AST112/ALT 125/Tbili 3.6 (scleral yellowing still noted per RN 4/3) Albumin 1.3, prealbumin 18.5  TG peaked at 740, now at 328 on 4/14 - Intralipids 100g/wk and monitor TG trends Intake / Output; MIVF: UOP 0.2 ml/kg/hr, drains (5 total): 132ml, 0cc from gastrostomy, net -3.9L since admit    GI Imaging:  4/6 CT - pelvic fluid collection concerning for abscess 4/12 CT - no new fluid collections GI Surgeries / Procedures:  3/14 Whipple procedure with R hemicolectomy and end ileostomy, cholecystectomy 3/15 washout, VAC placement 3/17 washout, VAC replacement.  Necrosis of entire small bowel except for the PB limb and proximal 40-50cm of jejunum 3/19 re-opening laparotomy, abd closure.   Necrotic SB. 3/21 SB resection, take down of choledochojejunal anastomosis and pancreaticojejunal anastomosis, take down of duodenojejunal anastomosis, placement of externalized biliary drain / Stamm gastrostomy tube, IA washout and placement of intraperitoneal drains 3/30 extubation 4/8 trach 4/12 left lateral abdominal drain placed by IR  Central access: R IJ CVC placed 3/15; changed to PICC placed 10/03/20 TPN start date: 09/10/2020  Nutritional Goals (RD rec on 4/14): 2800-3000 kCal, 185-210g AA, fluid >2L/day  Current Nutrition:  TPN  Plan: (NOT updated) - Continue concentrated TPN at 95 ml/hr - Intralipid 20% 243mL Mon/Thurs for now to prevent EFAD - TPN with Intralipid 2x/wk provides a daily average of 187g AA and 2869 kCal, meeting 100% of patient needs.  - Electrolytes in TPN: Na 112mEq/L, K 73mEq/L, remove Ca, Mag 76mEq/L, phos 60mmol/L,  cont Cl:Ac 1:1 - Daily multivitamin in TPN.  Remove standard trace elements and add back zinc 5mg , and selenium 65mcg.  Remove chromium while on RRT. - Continue resistant SSI Q4H,continue 60u insulin regular in TPN, Novolog 2 units SQ Q4H. - Renal function BID  Thank you for involving pharmacy in this patient's care.  Wilson Singer, PharmD PGY1 Pharmacy Resident 10/29/2020 7:43 AM

## 2020-10-29 NOTE — Progress Notes (Signed)
SLP Cancellation Note  Patient Details Name: [name] MRN: [id number] DOB: 01-27-1971   Cancelled treatment:        Pt is tolerating capping and is heading toward decannulation. SLP will sign off as PMV no longer indicated.  Our service was consulted for a clinical swallow assessment with [name] on 4/1. Its purpose was to determine if he could sip liquids for pleasure. At that time, he showed s/s of poor airway protection and likely aspiration.  No further action was taken.  Given complexity of his medical situation/short bowel syndrome, will await clarification from surgery and new consult orders if SLP is still warranted regarding pharyngeal swallow function.  Thank you, Neri Samek L. Tivis Ringer, [place] CCC/SLP SLP Clinical Specialist Acute Rehabilitation Services Office number [phone] Pager [phone]     Juan Quam Laurice 10/29/2020, 10:26 AM

## 2020-10-29 NOTE — Progress Notes (Signed)
Inpatient Rehab Admissions Coordinator:   I have met with pt. For ongoing discussion regarding potential CIR admit. He remains interested. Pt. Will need to have tunneled cath placed for outpatient HD if medical team feels that he likely need HD long-term.   Clemens Catholic, [place], [place] Admissions Coordinator  [phone] ([place]) [phone] (office)

## 2020-10-29 NOTE — Progress Notes (Addendum)
[facility] Progress Note     Assessment/ Plan:   1.Acute renal failure -likely has ATN With hypotensive episodes requiring Levophed in the setting of a very complicated surgical history with multiple visits to the OR + DIC + diffuse small bowel necrosis. Creatinine fluctuating represents compromised renal function with unfortunately repeated insults. No evidence of obstruction. Dark fluid in JP drain.   - Pt and family desire all aggressive interventions, confirmed 10/16/20 -startedCRRT 10/18/20-10/20/20  -Continue dialysis with intermittent HD on a  TTS schedule; last dialysis 4/19. 1L removed via UF yesterday.  - Will continue monitoring for any signs of renal recovery;  UOP 400 cc, remains oliguric.   2. Respiratory failure reintubated now on the ventilator--> s/p trach in OR 10/27/2020. Trach collar capped overnight.   3. Neuroendocrine tumor s/p Whipple with unfortunate extensive small bowel necrosis s/p resection. Family is hopeful for a small bowel transplant, however this is quite a long ways off d/t multiple issues. CT 4/6 showing possible pelvic abscess, previously on Zosyn  4. Severe malnutrition on TPN.  5. Anemia- Hgb dropped to 7.4. transfuse if <7.   6.   Hypokalemia. Resolved.    Subjective:   States he did well in dialysis yesterday. He has abdominal discomfort this morning. His trach collar was capped all night yesterday.   Objective:   BP 113/72   Pulse 88   Temp 98.3 F (36.8 C) (Oral)   Resp 20   Ht 6\' 2"  (1.88 m)   Wt 91.2 kg   SpO2 96%   BMI 25.81 kg/m   Intake/Output Summary (Last 24 hours) at 10/29/2020 0919 Last data filed at 10/29/2020 0840 Gross per 24 hour  Intake 2569.71 ml  Output 2910 ml  Net -340.29 ml   Weight change: -2 kg  Physical Exam:  GEN: suctioning himself in bed, no acute distress  CV: regular rate and rhythm, no murmur appreciated, right IJ RESP: no increased work of breathing, clear to ascultation  bilaterally with no crackles, wheezes, or rhonchi, trach collar capped  ABD: Bowel sounds present. Soft MSK: no LE edema SKIN: warm, dry NEURO: alert and oriented    Imaging: No results found.  Labs: BMET Recent Labs  Lab 10/25/20 0445 10/25/20 1716 10/26/20 0458 10/26/20 1600 10/27/20 0457 10/28/20 0455 10/29/20 0652  NA 137 139 141 138 140 142 138  K 3.8 3.9 4.0 3.6 3.8 3.9 4.3  CL 97* 99 100 101 99 101 101  CO2 26 25 26 28 28 25 26   GLUCOSE 162* 142* 168* 143* 156* 187* 134*  BUN 87* 108* 131* 54* 81* 127* 91*  CREATININE 4.64* 5.63* 6.62* 3.57* 4.88* 6.76* 5.64*  CALCIUM 8.5* 8.4* 8.6* 8.5* 8.7* 8.7* 8.6*  PHOS 4.8* 4.9* 5.1* 2.8 3.6 4.1 3.2   CBC Recent Labs  Lab 10/23/20 0552 10/24/20 0457 10/27/20 0457 10/29/20 0652  WBC 15.2* 15.3* 11.6* 10.1  NEUTROABS  --   --  8.0*  --   HGB 9.3* 9.7* 8.1* 7.4*  HCT 28.7* 29.8* 25.8* 24.6*  MCV 85.7 86.6 87.2 89.8  PLT 334 351 244 183    Medications:    . chlorhexidine  15 mL Mouth Rinse BID  . Chlorhexidine Gluconate Cloth  6 each Topical Q0600  . heparin injection (subcutaneous)  5,000 Units Subcutaneous Q8H  . insulin aspart  0-20 Units Subcutaneous Q4H  . insulin aspart  2 Units Subcutaneous Q4H  . mouth rinse  15 mL Mouth Rinse q12n4p  .  pantoprazole (PROTONIX) IV  40 mg Intravenous BID  . sodium chloride flush  10-40 mL Intracatheter Q12H  . sodium chloride flush  5 mL Intracatheter Q8H      Dalin Caldera, DO 10/29/2020, 9:19 AM

## 2020-10-30 ENCOUNTER — Encounter (HOSPITAL_COMMUNITY): Payer: Self-pay | Admitting: Surgery

## 2020-10-30 ENCOUNTER — Telehealth: Payer: Self-pay | Admitting: Internal Medicine

## 2020-10-30 ENCOUNTER — Inpatient Hospital Stay (HOSPITAL_COMMUNITY): Payer: Managed Care, Other (non HMO)

## 2020-10-30 DIAGNOSIS — I4891 Unspecified atrial fibrillation: Secondary | ICD-10-CM | POA: Diagnosis not present

## 2020-10-30 DIAGNOSIS — J9601 Acute respiratory failure with hypoxia: Secondary | ICD-10-CM | POA: Diagnosis not present

## 2020-10-30 DIAGNOSIS — D3A8 Other benign neuroendocrine tumors: Secondary | ICD-10-CM | POA: Diagnosis not present

## 2020-10-30 LAB — CBC
HCT: 23.5 % — ABNORMAL LOW (ref 39.0–52.0)
HCT: 24.9 % — ABNORMAL LOW (ref 39.0–52.0)
Hemoglobin: 7.1 g/dL — ABNORMAL LOW (ref 13.0–17.0)
Hemoglobin: 7.7 g/dL — ABNORMAL LOW (ref 13.0–17.0)
MCH: 26.8 pg (ref 26.0–34.0)
MCH: 27 pg (ref 26.0–34.0)
MCHC: 30.2 g/dL (ref 30.0–36.0)
MCHC: 30.9 g/dL (ref 30.0–36.0)
MCV: 88.7 fL (ref 80.0–100.0)
MCV: 87.4 fL (ref 80.0–100.0)
Platelets: 172 10*3/uL (ref 150–400)
Platelets: 211 10*3/uL (ref 150–400)
RBC: 2.65 MIL/uL — ABNORMAL LOW (ref 4.22–5.81)
RBC: 2.85 MIL/uL — ABNORMAL LOW (ref 4.22–5.81)
RDW: 17.1 % — ABNORMAL HIGH (ref 11.5–15.5)
RDW: 16.8 % — ABNORMAL HIGH (ref 11.5–15.5)
WBC: 12 10*3/uL — ABNORMAL HIGH (ref 4.0–10.5)
WBC: 19.8 10*3/uL — ABNORMAL HIGH (ref 4.0–10.5)
nRBC: 0.2 % (ref 0.0–0.2)
nRBC: 0.1 % (ref 0.0–0.2)

## 2020-10-30 LAB — COMPREHENSIVE METABOLIC PANEL
ALT: 129 U/L — ABNORMAL HIGH (ref 0–44)
AST: 105 U/L — ABNORMAL HIGH (ref 15–41)
Albumin: 1.3 g/dL — ABNORMAL LOW (ref 3.5–5.0)
Alkaline Phosphatase: 93 U/L (ref 38–126)
Anion gap: 12 (ref 5–15)
BUN: 136 mg/dL — ABNORMAL HIGH (ref 6–20)
CO2: 23 mmol/L (ref 22–32)
Calcium: 9.4 mg/dL (ref 8.9–10.3)
Chloride: 105 mmol/L (ref 98–111)
Creatinine, Ser: 7.74 mg/dL — ABNORMAL HIGH (ref 0.61–1.24)
GFR, Estimated: 8 mL/min — ABNORMAL LOW (ref >60)
Glucose, Bld: 106 mg/dL — ABNORMAL HIGH (ref 70–99)
Potassium: 5.1 mmol/L (ref 3.5–5.1)
Sodium: 140 mmol/L (ref 135–145)
Total Bilirubin: 3.4 mg/dL — ABNORMAL HIGH (ref 0.3–1.2)
Total Protein: 7.9 g/dL (ref 6.5–8.1)

## 2020-10-30 LAB — POCT I-STAT 7, (LYTES, BLD GAS, ICA,H+H)
Acid-base deficit: 2 mmol/L (ref 0.0–2.0)
Bicarbonate: 22.2 mmol/L (ref 20.0–28.0)
Calcium, Ion: 1.17 mmol/L (ref 1.15–1.40)
HCT: 24 % — ABNORMAL LOW (ref 39.0–52.0)
Hemoglobin: 8.2 g/dL — ABNORMAL LOW (ref 13.0–17.0)
O2 Saturation: 100 %
Patient temperature: 99.2
Potassium: 4 mmol/L (ref 3.5–5.1)
Sodium: 139 mmol/L (ref 135–145)
TCO2: 23 mmol/L (ref 22–32)
pCO2 arterial: 33.7 mmHg (ref 32.0–48.0)
pH, Arterial: 7.428 (ref 7.350–7.450)
pO2, Arterial: 177 mmHg — ABNORMAL HIGH (ref 83.0–108.0)

## 2020-10-30 LAB — COOXEMETRY PANEL
Carboxyhemoglobin: 1.1 % (ref 0.5–1.5)
Methemoglobin: 0.9 % (ref 0.0–1.5)
O2 Saturation: 95.5 %
Total hemoglobin: 8.5 g/dL — ABNORMAL LOW (ref 12.0–16.0)

## 2020-10-30 LAB — PROTIME-INR
INR: 1.3 — ABNORMAL HIGH (ref 0.8–1.2)
Prothrombin Time: 16.5 seconds — ABNORMAL HIGH (ref 11.4–15.2)

## 2020-10-30 LAB — PREPARE RBC (CROSSMATCH)

## 2020-10-30 LAB — BASIC METABOLIC PANEL
Anion gap: 15 (ref 5–15)
BUN: 69 mg/dL — ABNORMAL HIGH (ref 6–20)
CO2: 19 mmol/L — ABNORMAL LOW (ref 22–32)
Calcium: 8.2 mg/dL — ABNORMAL LOW (ref 8.9–10.3)
Chloride: 103 mmol/L (ref 98–111)
Creatinine, Ser: 4.79 mg/dL — ABNORMAL HIGH (ref 0.61–1.24)
GFR, Estimated: 14 mL/min — ABNORMAL LOW (ref >60)
Glucose, Bld: 83 mg/dL (ref 70–99)
Potassium: 4.4 mmol/L (ref 3.5–5.1)
Sodium: 137 mmol/L (ref 135–145)

## 2020-10-30 LAB — HEMOGLOBIN AND HEMATOCRIT, BLOOD
HCT: 29.2 % — ABNORMAL LOW (ref 39.0–52.0)
Hemoglobin: 9.5 g/dL — ABNORMAL LOW (ref 13.0–17.0)

## 2020-10-30 LAB — GLUCOSE, CAPILLARY
Glucose-Capillary: 114 mg/dL — ABNORMAL HIGH (ref 70–99)
Glucose-Capillary: 138 mg/dL — ABNORMAL HIGH (ref 70–99)
Glucose-Capillary: 151 mg/dL — ABNORMAL HIGH (ref 70–99)
Glucose-Capillary: 151 mg/dL — ABNORMAL HIGH (ref 70–99)
Glucose-Capillary: 161 mg/dL — ABNORMAL HIGH (ref 70–99)
Glucose-Capillary: 73 mg/dL (ref 70–99)
Glucose-Capillary: 82 mg/dL (ref 70–99)

## 2020-10-30 LAB — MAGNESIUM
Magnesium: 2.1 mg/dL (ref 1.7–2.4)
Magnesium: 1.5 mg/dL — ABNORMAL LOW (ref 1.7–2.4)

## 2020-10-30 LAB — TRIGLYCERIDES: Triglycerides: 227 mg/dL — ABNORMAL HIGH (ref <150)

## 2020-10-30 LAB — PHOSPHORUS: Phosphorus: 4.1 mg/dL (ref 2.5–4.6)

## 2020-10-30 MED ORDER — HEPARIN SODIUM (PORCINE) 1000 UNIT/ML DIALYSIS
1000.0000 [IU] | INTRAMUSCULAR | Status: DC | PRN
  Administered 2020-10-30 – 2020-11-01 (×2): 2400 [IU] via INTRAVENOUS_CENTRAL
  Filled 2020-10-30 (×2): qty 1

## 2020-10-30 MED ORDER — LIDOCAINE-PRILOCAINE 2.5-2.5 % EX CREA: 1.0000 "application " | TOPICAL_CREAM | CUTANEOUS | Status: DC | PRN

## 2020-10-30 MED ORDER — SODIUM CHLORIDE 0.9 % IV SOLN: 100.0000 mL | INTRAVENOUS | Status: DC | PRN

## 2020-10-30 MED ORDER — AMIODARONE HCL IN DEXTROSE 360-4.14 MG/200ML-% IV SOLN
60.0000 mg/h | INTRAVENOUS | Status: DC
  Administered 2020-10-31 – 2020-11-03 (×7): 30 mg/h via INTRAVENOUS
  Administered 2020-11-04: 60 mg/h via INTRAVENOUS
  Administered 2020-11-04: 30 mg/h via INTRAVENOUS
  Administered 2020-11-04 – 2020-11-10 (×21): 60 mg/h via INTRAVENOUS
  Filled 2020-10-30 (×12): qty 200
  Filled 2020-10-30: qty 400
  Filled 2020-10-30: qty 200
  Filled 2020-10-30: qty 400
  Filled 2020-10-30 (×9): qty 200
  Filled 2020-10-30: qty 400
  Filled 2020-10-30 (×6): qty 200
  Filled 2020-10-30: qty 600
  Filled 2020-10-30: qty 200

## 2020-10-30 MED ORDER — PHENYLEPHRINE HCL-NACL 10-0.9 MG/250ML-% IV SOLN: 0.0000 ug/min | INTRAVENOUS | Status: DC

## 2020-10-30 MED ORDER — SODIUM BICARBONATE 8.4 % IV SOLN
INTRAVENOUS | Status: AC
  Administered 2020-10-30: 50 meq via INTRAVENOUS
  Filled 2020-10-30: qty 50

## 2020-10-30 MED ORDER — SODIUM CHLORIDE 0.9% IV SOLUTION: Freq: Once | INTRAVENOUS | Status: DC

## 2020-10-30 MED ORDER — PENTAFLUOROPROP-TETRAFLUOROETH EX AERO: 1.0000 "application " | INHALATION_SPRAY | CUTANEOUS | Status: DC | PRN

## 2020-10-30 MED ORDER — LIDOCAINE HCL 1 % IJ SOLN
5.0000 mL | Freq: Once | INTRAMUSCULAR | Status: DC
  Filled 2020-10-30: qty 5

## 2020-10-30 MED ORDER — AMIODARONE HCL IN DEXTROSE 360-4.14 MG/200ML-% IV SOLN
60.0000 mg/h | INTRAVENOUS | Status: DC
  Administered 2020-10-30: 60 mg/h via INTRAVENOUS
  Filled 2020-10-30: qty 200

## 2020-10-30 MED ORDER — LACTATED RINGERS IV BOLUS: 500.0000 mL | Freq: Once | INTRAVENOUS | Status: DC

## 2020-10-30 MED ORDER — NOREPINEPHRINE 4 MG/250ML-% IV SOLN
INTRAVENOUS | Status: AC
  Administered 2020-10-30: 2 ug/min via INTRAVENOUS
  Filled 2020-10-30: qty 250

## 2020-10-30 MED ORDER — SODIUM CHLORIDE 0.9% IV SOLUTION: Freq: Once | INTRAVENOUS | Status: AC

## 2020-10-30 MED ORDER — DIGOXIN 0.25 MG/ML IJ SOLN
0.2500 mg | Freq: Four times a day (QID) | INTRAMUSCULAR | Status: AC
Start: 2020-10-30 — End: 2020-10-31
  Administered 2020-10-31: 0.25 mg via INTRAVENOUS
  Filled 2020-10-30: qty 2

## 2020-10-30 MED ORDER — PHENYLEPHRINE HCL-NACL 10-0.9 MG/250ML-% IV SOLN
0.0000 ug/min | INTRAVENOUS | Status: DC
  Administered 2020-10-30: 340 ug/min via INTRAVENOUS
  Administered 2020-10-30: 385 ug/min via INTRAVENOUS
  Administered 2020-10-30: 375 ug/min via INTRAVENOUS
  Administered 2020-10-30: 330 ug/min via INTRAVENOUS
  Administered 2020-10-30: 13.333 ug/min via INTRAVENOUS
  Administered 2020-10-30: 400 ug/min via INTRAVENOUS
  Administered 2020-10-30: 320 ug/min via INTRAVENOUS
  Administered 2020-10-30: 375 ug/min via INTRAVENOUS
  Administered 2020-10-30: 50 ug/min via INTRAVENOUS
  Administered 2020-10-30: 320 ug/min via INTRAVENOUS
  Administered 2020-10-30: 380 ug/min via INTRAVENOUS
  Administered 2020-10-31: 170 ug/min via INTRAVENOUS
  Administered 2020-10-31: 260 ug/min via INTRAVENOUS
  Administered 2020-10-31: 210 ug/min via INTRAVENOUS
  Administered 2020-10-31: 300 ug/min via INTRAVENOUS
  Administered 2020-10-31: 230 ug/min via INTRAVENOUS
  Administered 2020-10-31: 220 ug/min via INTRAVENOUS
  Administered 2020-10-31: 300 ug/min via INTRAVENOUS
  Administered 2020-10-31: 310 ug/min via INTRAVENOUS
  Administered 2020-10-31: 270 ug/min via INTRAVENOUS
  Administered 2020-10-31: 320 ug/min via INTRAVENOUS
  Administered 2020-10-31: 310 ug/min via INTRAVENOUS
  Filled 2020-10-30: qty 1000
  Filled 2020-10-30: qty 250
  Filled 2020-10-30: qty 750
  Filled 2020-10-30: qty 250
  Filled 2020-10-30 (×2): qty 1000
  Filled 2020-10-30: qty 250
  Filled 2020-10-30: qty 500
  Filled 2020-10-30: qty 1000
  Filled 2020-10-30: qty 500

## 2020-10-30 MED ORDER — ZINC CHLORIDE 1 MG/ML IV SOLN
INTRAVENOUS | Status: AC
  Filled 2020-10-30: qty 1246.4

## 2020-10-30 MED ORDER — LIDOCAINE HCL (PF) 1 % IJ SOLN: 5.0000 mL | INTRAMUSCULAR | Status: DC | PRN

## 2020-10-30 MED ORDER — AMIODARONE LOAD VIA INFUSION
150.0000 mg | Freq: Once | INTRAVENOUS | Status: AC
  Administered 2020-10-30: 150 mg via INTRAVENOUS
  Filled 2020-10-30: qty 83.34

## 2020-10-30 MED ORDER — ALTEPLASE 2 MG IJ SOLR: 2.0000 mg | Freq: Once | INTRAMUSCULAR | Status: DC | PRN

## 2020-10-30 MED ORDER — NOREPINEPHRINE 4 MG/250ML-% IV SOLN
0.0000 ug/min | INTRAVENOUS | Status: DC
  Administered 2020-10-30: 10 ug/min via INTRAVENOUS
  Administered 2020-10-31: 7 ug/min via INTRAVENOUS
  Filled 2020-10-30 (×2): qty 250

## 2020-10-30 MED ORDER — ALBUMIN HUMAN 25 % IV SOLN: 50.0000 g | Freq: Once | INTRAVENOUS | Status: DC

## 2020-10-30 MED ORDER — FAT EMULSION PLANT BASED 20% (INTRALIPID)IV EMUL
250.0000 mL | INTRAVENOUS | Status: AC
  Filled 2020-10-30: qty 250

## 2020-10-30 MED ORDER — MAGNESIUM SULFATE 2 GM/50ML IV SOLN
2.0000 g | Freq: Once | INTRAVENOUS | Status: AC
  Administered 2020-10-30: 2 g via INTRAVENOUS
  Filled 2020-10-30: qty 50

## 2020-10-30 MED ORDER — AMIODARONE IV BOLUS ONLY 150 MG/100ML
150.0000 mg | Freq: Once | INTRAVENOUS | Status: AC
  Filled 2020-10-30: qty 100

## 2020-10-30 MED ORDER — PHENYLEPHRINE HCL-NACL 10-0.9 MG/250ML-% IV SOLN
INTRAVENOUS | Status: AC
  Filled 2020-10-30: qty 250

## 2020-10-30 MED ORDER — SODIUM CHLORIDE 0.9 % IV BOLUS
1000.0000 mL | Freq: Once | INTRAVENOUS | Status: AC
  Administered 2020-10-30: 1000 mL via INTRAVENOUS

## 2020-10-30 MED ORDER — SODIUM BICARBONATE 8.4 % IV SOLN: 50.0000 meq | Freq: Once | INTRAVENOUS | Status: AC

## 2020-10-30 NOTE — Telephone Encounter (Signed)
Dr. Zenia Resides from [facility] calling requesting on call provider.  Gave her number for our on call provider Cecilie Kicks.  Attempted to reach her to give her hospital oncall provider unable to reach her.

## 2020-10-30 NOTE — Progress Notes (Signed)
PHARMACY - TOTAL PARENTERAL NUTRITION CONSULT NOTE  Indication:  Short bowel syndrome  Patient Measurements: Height: 6\' 2"  (188 cm) Weight: 124.9 kg (275 lb 5.7 oz) IBW/kg (Calculated) : 82.2 TPN AdjBW (KG): 95.3 Body mass index is 35.35 kg/m.  Assessment:  89 YOM with history of mass adjacent to head of the pancreas in 2021. 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  Developed cholecystitis in Feb 2022 and had percutaneous cholecystectomy tube placement.  Presented this admission for neuroendocrine tumor resection with Whipple procedure, also had right total colectomy, colostomy and cholecystectomy on 09/09/2020. SMV was lacerated during procedure and repaired by bovine pericardial patch. Developed hemorrhagic shock with bile leak post-op and underwent placement of biliary T tube with abdominal washout on 3/15 PM. Antimicrobial therapies discontinued 4/20.  Pharmacy consulted to manage TPN.  Glucose / Insulin: no hx DM, A1c 5.2%. CBGs controlled. Utilized 16 units SSI in the past 24h hours, 60 units in TPN, Novolog 2 units Q4H (hold for CBG < 150).   Electrolytes: CoCa high at 11.2, others WNL Renal: CRRT started 4/9 > 4/11, now on iHD TTS (4K baths), BUN up to 136. Due for HD today  Hepatic: AST105/ALT 129/Tbili 3.4  Albumin 1.3, prealbumin 18.5  TG peaked at 740, now at 328 on 4/14 - Intralipids 100g/wk and monitor TG trends Intake / Output; MIVF: UOP 0.2 ml/kg/hr, drains (5 total): 1376ml, 0cc from gastrostomy, net -3.9L since admit    GI Imaging:  4/6 CT - pelvic fluid collection concerning for abscess 4/12 CT - no new fluid collections GI Surgeries / Procedures:  3/14 Whipple procedure with R hemicolectomy and end ileostomy, cholecystectomy 3/15 washout, VAC placement 3/17 washout, VAC replacement.  Necrosis of entire small bowel except for the PB limb and proximal 40-50cm of jejunum 3/19 re-opening laparotomy, abd closure.  Necrotic SB. 3/21 SB  resection, take down of choledochojejunal anastomosis and pancreaticojejunal anastomosis, take down of duodenojejunal anastomosis, placement of externalized biliary drain / Stamm gastrostomy tube, IA washout and placement of intraperitoneal drains 3/30 extubation 4/8 trach 4/12 left lateral abdominal drain placed by IR  Central access: R IJ CVC placed 3/15; changed to PICC placed 10/03/20 TPN start date: 10/05/2020  Nutritional Goals (RD rec on 4/14): 2800-3000 kCal, 185-210g AA, fluid >2L/day  Current Nutrition:  TPN  Plan: (NOT updated) - Continue concentrated TPN at 95 ml/hr - Intralipid 20% 239mL Mon/Thurs for now to prevent EFAD - TPN with Intralipid 2x/wk provides a daily average of 187g AA and 2869 kCal, meeting 100% of patient needs.  - Electrolytes in TPN: Na 157mEq/L, Decrease K to 5 mEq/L (11.4 mEq total), remove Ca, Mag 54mEq/L, phos 51mmol/L,  Change Cl:Ac to 1:2 - Daily multivitamin in TPN.  Remove standard trace elements and add back zinc 5mg , and selenium 64mcg.  Remove chromium while on RRT. - Continue resistant SSI Q4H,continue 60u insulin regular in TPN, Novolog 2 units SQ Q4H. - Renal function BID -Planning for tunneled line placement tomorrow in IR   Thank you for involving pharmacy in this patient's care.  Albertina Parr, PharmD., BCPS, BCCCP Clinical Pharmacist Please refer to [facility] for unit-specific pharmacist

## 2020-10-30 NOTE — Progress Notes (Signed)
IP rehab admissions - not medically ready for CIR at this point.  Will follow up again for progress.  Noted patient may be ready early next week for CIR.  Call for questions.  [phone]

## 2020-10-30 NOTE — Progress Notes (Signed)
This nurse arrived to hang TPN. Nurses at [facility] declined d/t needing all lumens for other medication administration at this time. Instructed nurse to notify [facility] when able to hang TPN. VU. Fran Lowes, RN [facility]

## 2020-10-30 NOTE — Telephone Encounter (Signed)
[facility] called to talk with DOD about patient that's currently in hospital

## 2020-10-30 NOTE — Progress Notes (Signed)
On 1st evening round, patient temperature at 101.8. Emergency release blood just finishing. Dr.Thompson notified, unsure if response to blood or due to the multiple events that have occurred. No other new symptoms noted. Ice packs encouraged and PR tylenol if needed.   Pt blood tubing switched for 2nd unit of emergency release blood, now infusing. Temp now 101.5  Will continue to closely monitor.

## 2020-10-30 NOTE — Progress Notes (Signed)
RT assessed pt's stoma following trach removal. Pt is still stable on RA, sating 95% with no SOB. RT removed the gauze on the stoma site due to moderate amounts of secretions coming out of stoma. RT cleaned site and reapplied gauze/ tape. Rt will continue to monitor.

## 2020-10-30 NOTE — Progress Notes (Signed)
[name], MRN:  [id number], DOB:  February 27, 1971, LOS: 45 ADMISSION DATE:  10/08/2020, CONSULTATION DATE:  10/30/20 REFERRING MD:  Anesthesia, CHIEF COMPLAINT:  Whipple, post-op shock   Brief History:  50 y.o. M with PMH of neuroendocrine tumor and underwent Whipple procedure 3/14 with intra-operative SMV laceration and repair by vascular with bovine pericardial patch.  Pt had worsening shock overnight and required three pressors despite massive transfusion protocol (received 150 blood products).  He was taken back to the OR for washout on 3/15 and returned to the ICU intubated.  Taken back 3/17 again found to have small bowel necrosis s/p resection.  Plans again to return 3/19 to OR.   Totality of small bowel has been resected. The case was discussed with [facility] who may consider eval for sm intestinal transplant pending clinical course.  Continuing aggressive supportive care.   Past Medical History:   has a past medical history of AICD (automatic cardioverter/defibrillator) present, Dyslipidemia, Heart failure with mildly reduced EF, History of kidney stones, HTN (hypertension), ventricular fibrillation, and Nonischemic cardiomyopathy.  Significant Hospital Events:  3/14: Admit to Surgery, to OR for whipple, SMV injury and repair, massive transfusion protocol overnight  3/15: Back to OR for washout, x2. IR for arteriogram. No major bleed source identified in OR, or arterial bleed w IR. Robust product resuscitation >75 products (+ TXA + novoseven) 3/16: off pressors, add'l 39 products overnight. Slowing resuscitation this morning with hemodynamic improvements and slowing drain output; OR x 2 - washout, biliary t tube, wound vac -- washout, wound vac, IR for arteriogram -- no arterial bleed for embolization 3/17:  Aggressive balanced transfusion continue overnight with marked hemodynamic improvement since yesterday evening. Off pressors x several hours, Wound vac output significantly slowing  (from 536ml q15-51min to 560ml q1hr+) and output is much thinner, Thin bloody secretions from mouth approx 261ml, Decreased RR from 22 to 18, Unable to tolerate NE- severe bradycardia 3/19 taken back to OR. Small bowel noted to be almost completely necrosed. Patient was closed with plans for goals of car discussion.  3/20 GOC family endorses full scope of treatment. Primary team discussed with [facility] the possibility of transfer for small bowel transplant. [facility] felt as though transfer would not change outcome. 3/21 Ongoing discussion with family and [facility]. Patient may be a candidate for transplant if he can stabilize post a small bowel resection. He was taken back to the OR and small bowel was resected.  3/23 weaning pressors. Versed off. Remains encephalopathic 3/24 off pressors. Low dose dilaudid gtt -- only weakly grimaces to pain. Changing sedation to precedex + PRN fentanyl. Long discussion with 2 brothers regarding clinical case -- tried to clarify that while the term "stable" has been used, in this instance is meaning that he has not declined from previous shift but is in fact still critically ill with multisystem organ dysfunction.  3/25 Cr and BUN increased. Off sedation  3/26 Pressors off, CT head benign, BUN/Cr continue to creep up 3/27 Tachycardia/HTN overnight improved with fentanyl gtt 2/28 PCCM sign off. Trauma to take over vent/CCM needs.  3/30 extubated 4/2 desaturated overnight to the 30s improved with BVM and NTS.  4/5 poor airway protection. PCCM consulted for re-intubation.  4/6 CT a/p Large RLL opacity, smaller LLL opacity. Post op changes. 5.4cm fluid collection in posterior pelvis, concerning for abscess. Started on Zosyn  4/7 got transfused overnight and then diuresed. Worse renal function 4/9 transfused again. HD line inserted and started on  CRRT 4/10 started on trach collar trials. 4/12 off CRRT and transitioned to IHD. Surgery placed abdominal drain with minimal output--  during procedure seemed c/w hematoma however since grew out klebsiella  4/13, 4/14 Tolerated dialysis 4/15 Placed on vent overnight for rest 4/16 Tolerating trach collar 4/17 got HD. Trach dislodged overnight, replaced with #6 cuffless shiley.  4/18 afebrile, trach collar + PMV. Up to chair with PT  4/21 called back for hypotension, Afib/RVR   Consults:  PCCM VVS  IR  Palliative  Procedures:  PICC 3/25 > JP x 2  Biliary drain Pancreatic stent drain ETT 2/24>>3/13; 3/15 >> 3/29;  4/5>>4/8 Trach 4/8> L lateral abdominal drain 4/12>  Significant Diagnostic Tests:  See radiology tab Most recent significant imaging:  CT a/p  4/12> redemonstrated 7.6 x 6 x 2.6 cm fluid collection R lobe of liver. Redomonstrated pelvic fluid collection 4.9 x 4.9cm, 11 x 10.1 x 3.4 cm ill defined fluid collection v hematoma in abdominal mesentery. 4 x 3.2 x 2.2 cm ill defined fluid collection in ventral aspect of abdominal mesentery. Post op changes in setting of whipple, total small bowel resection, pancreatic stent, multiple drains.   Micro Data:  See micro tab Most recent significant findings:   4/5 trach asp >> Klebsiella pneumoniae 4/12 Pelvic abscess >> ampicillin resistant Klebsiella pneumoniae 4/12 BCx> no growth   Antimicrobials:  Zosyn 3/21 > 3/29; 4/5 >> Eraxis 3/23> 3/29, 4/12>  Interim History / Subjective:  In extremis.  Objective   Blood pressure (!) 88/50, pulse (!) 105, temperature 98.2 F (36.8 C), temperature source Oral, resp. rate (!) 29, height 6\' 2"  (1.88 m), weight 89.3 kg, SpO2 96 %.        Intake/Output Summary (Last 24 hours) at 10/30/2020 1728 Last data filed at 10/30/2020 1600 Gross per 24 hour  Intake 2254.84 ml  Output 2360 ml  Net -105.16 ml   Filed Weights   10/29/20 0500 10/29/20 1503 10/30/20 0500  Weight: 91.2 kg 89.4 kg 89.3 kg   Physical Exam:  Constitutional: ill appearing middle aged M reclied in bed in distress  HEENT: temporal muscle  wasting. Pink dry mm  Cardiovascular:IRIR s1s2 cap refill M 3 sec thready pulse  Respiratory: symmetrical chest expansion, increased RR. No accessory suye  Gastrointestinal: Soft abd. Midline surgical site. lower abd drain (pancreas) is a bit more bloody Skin: c/d/w, normal turgor  Neurologic: Awake. Follow commands. Lethargic  Psych: anxious appearing    Resolved Hospital Problem list   Hemorrhagic shock hypophosphatemia Septic shock  Klebsiella pneumoniae pneumonia Acute hypoxemic respiratory failure requiring mechanical ventilation  Assessment & Plan:   Afib RVR  SVT  -ICD has defib x 2, remains in unstable atrial tachy rhythm  Shock  P -amio, dig -electrolytes optimization  -ICU monitoring  -coox, ECHO  -Cont NE   Acute respiratory failure with hypoxia  S/p Tracheostomy -- decanulated -- re-inserting 4/21  -aspiration PNA, improved -Klebsiella pneumoniae PNA, improved  Now resp distress related to hemodynamic perturbations P - Check ABG - Replace trach if needed and start on mechanical ventilation   Pancreatic neuroendocrine tumor s/p whipple and SMV repair 3/21. Post op course c/b hemorrhagic shock, small bowel necrosis s/p total small intestine resection -per CCS  -cont TPN. CCS plans for tunneled access -Family's hope is for transfer to [facility] for small intestine transplant. Suspect still significant progress to be made with acute issues before candidacy evaluation could occur. Am not sure how pts underlying NICM will impact candidacy,  or current renal failure (if no renal recovery)  Klebsiella pneumoniae pelvic abscess  -s/p drain placement 4/12 with IR  P -cont zosyn, eraxis   Acute renal failure with oliguria, requiring RRT -probably ATN -did requiring CRRT initially,  on iHD now -got HD 4/17 (was delayed 4/16 so required an off schedule run)  P -nephro following, plan for TTS iHD if tolerated  -monitoring for signs of renal recovery    Malnutrition Short gut syndrome  -in setting of total small bowel resection P -TPN dependent  Anemia, critical illness  -critical illness, renal failure  P - transfusing 4/21 -post PRBC H/H   NICM s/p ICD  -has had improvement of LVEF on last echo  P -unable to do GDHFT given GI abnormalities   GOC - Full code except no shocks - The family hopes for small intestine transplant at [facility]. If not a feasible destination, would likely need to re-evaluate GOC - Pt would like to be aware/involved with decision making    Daily Goals Checklist  Pain/Anxiety/Delirium protocol (if indicated): PRN only for RASS goal 0 VAP protocol (if indicated): bundle in place.   DVT prophylaxis: SCD Nutrition Status: TPN  GI prophylaxis: Pantoprazole Urinary catheter: external Central lines: PICC 3/25 Glucose control: -SSI + TPN Mobility/therapy needs: PT Restraints: Restraint type:NA Reason for restraints:NA Daily labs- CMP Code Status: Partial - -no CV/DF (has an ICD however) Family Communication: per primary Disposition: ICU, hope is for CIR   Goals of Care:  Last date of multidisciplinary goals of care discussion: 72/5 Family and staff present: Dr. Zenia Resides Summary of discussion: Family wishes for aggressive care.   Code Status:  Partial - no CV/DF by [place] (has an ICD which is not to be turned off)    CCT: 45 min  [name] MSN, AGACNP-BC [place] [phone] If no answer, [phone] 10/30/2020, 5:52 PM

## 2020-10-30 NOTE — Progress Notes (Addendum)
Approximately 1/2 through 1L bolus of NS and pt BP beginning to drop again (never came up higher than 89/59). Currently 79/55 and HR sustaining in 110s. Pt continues to complain of intense pain that is increasing. Unable to administer pain medication due to hypotension. Contacted Dr. Zenia Resides, LM with [facility], awaiting call back.

## 2020-10-30 NOTE — Progress Notes (Signed)
Lurline Idol was removed per MD order. Pt is currently stable on RA. Stoma site was cleaned and gauze was taped around opening. RT will monitor pt. Rn at bedside to witness.

## 2020-10-30 NOTE — Procedures (Signed)
Arterial Catheter Insertion Procedure Note  CAMERAN PETTEY  [id number]  Oct 21, 1970  Date:10/30/20  Time:5:52 PM    Provider Performing: [name]    Procedure: Insertion of Arterial Line [phone]) with US guidance ([id number])   Indication(s) Blood pressure monitoring and/or need for frequent ABGs  Consent Risks of the procedure as well as the alternatives and risks of each were explained to the patient and/or caregiver.  Consent for the procedure was obtained and is signed in the bedside chart  Anesthesia None   Time Out Verified patient identification, verified procedure, site/side was marked, verified correct patient position, special equipment/implants available, medications/allergies/relevant history reviewed, required imaging and test results available.   Sterile Technique Maximal sterile technique including full sterile barrier drape, hand hygiene, sterile gown, sterile gloves, mask, hair covering, sterile ultrasound probe cover (if used).   Procedure Description Area of catheter insertion was cleaned with chlorhexidine and draped in sterile fashion. With real-time ultrasound guidance an arterial catheter was placed into the left radial artery.  Appropriate arterial tracings confirmed on monitor.     Complications/Tolerance None; patient tolerated the procedure well.   EBL Minimal   Specimen(s) None

## 2020-10-30 NOTE — Progress Notes (Signed)
RT was called by MD to obtain ABG sample. Pt was clearly in distress and had copious secretions coming out of mouth and stoma. RT assisted Dr. Tamala Julian in placing #6 shiley flex back into pt. Color change on C02 detector was noted. Pt was then placed on vent Ps/CPAP 10/+5, 70% per MD and is tolerating well at this time. RT will monitor.

## 2020-10-30 NOTE — Progress Notes (Signed)
     13 Days Post-Op  Subjective: Afebrile, hemodynamically stable. WBC normal yesterday. Worked with PT/OT. Trach remains capped for >48 hours.  Objective: Vital signs in last 24 hours: Temp:  [97.5 F (36.4 C)-98.8 F (37.1 C)] 98.6 F (37 C) (04/21 0400) Pulse Rate:  [88-104] 98 (04/21 0600) Resp:  [12-27] 24 (04/21 0600) BP: (106-128)/(63-113) 112/75 (04/21 0500) SpO2:  [72 %-99 %] 99 % (04/21 0600) Weight:  [89.3 kg-89.4 kg] 89.3 kg (04/21 0500) Last BM Date:  (pta)  Intake/Output from previous day: 04/20 0701 - 04/21 0700 In: 2198.3 [I.V.:2198.3] Out: 1370 [Drains:1370] Intake/Output this shift: Total I/O In: 1140.1 [I.V.:1140.1] Out: 625 [Drains:625]  PE: General: no acute distress Neuro: alert and oriented, no focal deficits HEENT: trach capped Resp: normal work of breathing CV: RRR Abdomen: soft, nondistended, wound vac in place on midline incision. RLQ ostomy site is clean and granulating. R JP with dark bile-tinged milky fluid. Biliary drain with bile. LUQ pancreatic stent with clear colorless fluid. LLQ perc drain with old blood. G tube in place to gravity LUQ.   Lab Results:  Recent Labs    10/29/20 0652  WBC 10.1  HGB 7.4*  HCT 24.6*  PLT 183   BMET Recent Labs    10/29/20 0652 10/30/20 0508  NA 138 140  K 4.3 5.1  CL 101 105  CO2 26 23  GLUCOSE 134* 106*  BUN 91* 136*  CREATININE 5.64* 7.74*  CALCIUM 8.6* 9.4   PT/INR No results for input(s): LABPROT, INR in the last 72 hours. CMP     Component Value Date/Time   NA 140 10/30/2020 0508   NA 142 07/21/2020 0921   K 5.1 10/30/2020 0508   CL 105 10/30/2020 0508   CO2 23 10/30/2020 0508   GLUCOSE 106 (H) 10/30/2020 0508   BUN 136 (H) 10/30/2020 0508   BUN 16 07/21/2020 0921   CREATININE 7.74 (H) 10/30/2020 0508   CREATININE 1.16 03/01/2016 1607   CALCIUM 9.4 10/30/2020 0508   PROT 7.9 10/30/2020 0508   PROT 6.7 07/21/2020 0921   ALBUMIN 1.3 (L) 10/30/2020 0508   ALBUMIN 4.1  07/21/2020 0921   AST 105 (H) 10/30/2020 0508   ALT 129 (H) 10/30/2020 0508   ALKPHOS 93 10/30/2020 0508   BILITOT 3.4 (H) 10/30/2020 0508   BILITOT 0.8 07/21/2020 0921   GFRNONAA 8 (L) 10/30/2020 0508   GFRNONAA >89 09/22/2015 1008   GFRAA 82 07/21/2020 0921   GFRAA >89 09/22/2015 1008   Lipase     Component Value Date/Time   LIPASE 23 08/22/2020 0318       Studies/Results: No results found.  Assessment/Plan 50 yo male 1 month s/p Whipple, right colectomy and SMV repair for T3N0 neuroendocrine tumor. C/b postop hemorrhagic shock with DIC and subsequent diffuse small bowel necrosis, now s/p small bowel resection with external drainage of bile duct, pancreatic duct and stomach. - Intermittent HD TThS. - Continue TPN. NPO except small amounts of ice chips for comfort. - G tube to gravity - Decannulate trach today - Vac change MWF - Continue PT/OT - VTE: SQH - Dispo: ICU. Planning for discharge to CIR, tentatively next week. Will request IR placement of tunneled line for HD and TPN tomorrow.    LOS: 47 days    Michaelle Birks, MD [facility] Surgery General, Hepatobiliary and Pancreatic Surgery 10/30/20 6:51 AM

## 2020-10-30 NOTE — Progress Notes (Signed)
Patient became hypotensive and extremely tachycardic this afternoon, with HR up to 200. He was given a 1L fluid bolus with minimal response in blood pressure. EKG showed a-fib with RVR. ICD fired 2 separate times while HR was >200, with subsequent transient decrease in HR. Pressors were started, as well as amio bolus and infusion, PRBCs tranfused. HR has decreased to 110s-120s on amio infusion. CCM and cardiology consulted.  On exam all drains have blood tinge, which is new from this morning. Hgb stable at 7.8 prior to starting transfusion. Coags pending. Trach replaced through previous stoma and patient was placed back on vent due to increased work of breathing and copious secretions. HR has improved on amio infusion. Will wean pressors as able now that tachycardia is controlled.  I had a discussion with the patient's family members regarding the recent events. I discussed that we are still working this up and are awaiting an echo, etc. Discussed that he may not recover from this. Family remains optimistic. They have previously made the decision that they do not want [place] to administer any external shocks. They are planning to discuss this and let us know if they want to reverse this decision. They are aware that for now we will not administer any shocks, based on previous discussions.   Michaelle Birks, MD [facility] Surgery General, Hepatobiliary and Pancreatic Surgery 10/30/20 6:57 PM

## 2020-10-30 NOTE — Consult Note (Addendum)
Cardiology Consultation:   Patient ID: [name] MRN: [id number]; DOB: 1971-02-26  Admit date: 10/04/2020 Date of Consult: 10/30/2020  PCP:  Gildardo Pounds, NP   [place]  Cardiologist:  Cristopher Peru, MD  Advanced Practice Provider:  No care team member to display Electrophysiologist:  None        Patient Profile:   [name] is a 50 y.o. male with a hx of NICM with VF, s/p ICD insertion-st jude, for primary prevention but had recurrent VT and VF and placed on amiodarone.  Last VT over 3 years ago, pt is from [place] and lives there part of the time, HTN, HLD and EF 45-50% in 2021 who is being seen today for the evaluation of SVT and PAF with RVR and ICD discharge at the request of Dr. Zenia Resides.  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 Post op with hemorrhage and shock with transfusion going back to OR with abd. Washout and placement of biliary tube. Remained intubated, developed AKI and small bowel necrosis, with no viable remaining small bowel. Back to OR for resection 3/17.  Totality of small bowel has been resected and case discussed with [facility] who may consider eval for sm intestinal transplant.     He has had septic shock as well. Now on dialysis as well.  Lurline Idol has been placed.  He was doing better and on the 18th up to chair with PT.   Most recent Echo 09/26/20 EF 40-45% The left ventricle  demonstrates global hypokinesis. Left ventricular diastolic parameters are consistent with Grade I diastolic dysfunction (impaired relaxation).   On the 21st had hypotension a fib /RVR .  RV normal trivial MR - ICD has shocked  him twice beginning amiodarone  CBC Hgb 7.7 WBC 19.8 plts 211  K+ 5.1, BUN 136, Cr 7.74 AST 105 ALT 129  Mg+ 2.1  EKG:  The EKG was personally reviewed and demonstrates:  A fib wth RVR. Telemetry:  Telemetry was personally reviewed and demonstrates:  SR to ST now + SVT at 270 at times appeared VT but appears to be paced - ICD may have been pacing out to SVT.  BP 80/53 R 26, P 111 now on norepinephrine.   Was on amiodarone as outpt but has not been receiving with Vent and multiple complications.  Pt had trach decannulated today then dialysis.  He could feel the ICD shocks, as well.  Currently alert and anxious.  IV amiodarone is infusing. Pt now with trach on pressure support.        Past Medical History:  Diagnosis Date  . AICD (automatic cardioverter/defibrillator) present   . Dyslipidemia   . Heart failure with mildly reduced EF    EF 35-40 in 2011 // Echo 06/2019: EF 45-50, Gr 2 DD, RAE, mild MR, mild TR, mild PI, RVSP 33.6  . History of kidney stones   . HTN (hypertension)   . Hx of ventricular fibrillation    Hx of VFib arrest s/p AICD placement  . Nonischemic cardiomyopathy     Past Surgical History:  Procedure Laterality Date  . APPLICATION OF WOUND VAC N/A 10/03/2020   Procedure:  APPLICATION OF WOUND VAC;  Surgeon: [name], MD;  Location: [facility];  Service: General;  Laterality: N/A;  . APPLICATION OF WOUND VAC N/A 09/30/2020   Procedure: APPLICATION OF WOUND VAC;  Surgeon: [name], MD;  Location: [facility];  Service: General;  Laterality: N/A;  . APPLICATION OF WOUND VAC  09/28/2020   Procedure: APPLICATION OF WOUND VAC; PLACEMENT OF NEGATIVE PRESSURE DRESSING;  Surgeon: [name], MD;  Location: [place];  Service: General;;  . BOWEL RESECTION  09/26/2020   Procedure: SMALL BOWEL RESECTION;  Surgeon: [name], MD;  Location: [facility];  Service: General;;  . BOWEL RESECTION N/A 09/18/2020   Procedure: SMALL BOWEL RESECTION;  Surgeon: [name], MD;   Location: [facility];  Service: General;  Laterality: N/A;  . CARDIAC DEFIBRILLATOR PLACEMENT    . CHOLECYSTECTOMY N/A 09/30/2020   Procedure: CHOLECYSTECTOMY;  Surgeon: [name], MD;  Location: [facility];  Service: General;  Laterality: N/A;  . COLECTOMY N/A 09/15/2020   Procedure: RIGHT TOTAL COLECTOMY;  Surgeon: [name], MD;  Location: [facility];  Service: General;  Laterality: N/A;  . COLOSTOMY  09/17/2020   Procedure: COLOSTOMY;  Surgeon: [name], MD;  Location: [facility];  Service: General;;  . ERCP N/A 05/27/2020   Procedure: ENDOSCOPIC RETROGRADE CHOLANGIOPANCREATOGRAPHY (ERCP);  Surgeon: Clarene Essex, MD;  Location: [facility];  Service: Endoscopy;  Laterality: N/A;  . ESOPHAGOGASTRODUODENOSCOPY (EGD) WITH PROPOFOL N/A 06/25/2020   Procedure: ESOPHAGOGASTRODUODENOSCOPY (EGD) WITH PROPOFOL;  Surgeon: Arta Silence, MD;  Location: [facility];  Service: Endoscopy;  Laterality: N/A;  . FINE NEEDLE ASPIRATION N/A 06/25/2020   Procedure: FINE NEEDLE ASPIRATION (FNA) LINEAR;  Surgeon: Arta Silence, MD;  Location: [facility];  Service: Endoscopy;  Laterality: N/A;  . GASTROSTOMY N/A 09/14/2020   Procedure: INSERTION OF GASTROSTOMY TUBE;  Surgeon: [name], MD;  Location: [facility];  Service: General;  Laterality: N/A;  . IR ANGIOGRAM SELECTIVE EACH ADDITIONAL VESSEL  09/13/2020  . IR ANGIOGRAM SELECTIVE EACH ADDITIONAL VESSEL  09/29/2020  . IR ANGIOGRAM SELECTIVE EACH ADDITIONAL VESSEL  09/15/2020  . IR ANGIOGRAM VISCERAL SELECTIVE  10/03/2020  . IR PERC CHOLECYSTOSTOMY  08/27/2020  . LAPAROTOMY N/A 09/13/2020   Procedure: REOPENING OF LAPAROTOMY ABDOMINAL WASHOUT;  Surgeon: [name], MD;  Location: [facility];  Service: General;  Laterality: N/A;  . LAPAROTOMY N/A 09/10/2020   Procedure: RE-EXPLORATORY LAPAROTOMY;  Surgeon: [name], MD;  Location: [facility];  Service: General;  Laterality: N/A;  . LAPAROTOMY N/A 10/01/2020   Procedure: RE-EXPLORATORY LAPAROTOMY; ABDOMINAL  WASHOUT;  Surgeon: [name], MD;  Location: [facility];  Service: General;  Laterality: N/A;  . LAPAROTOMY N/A 09/26/2020   Procedure: RE -EXPLORATORY LAPAROTOMY AND [place];  Surgeon: [name], MD;  Location: [facility];  Service: General;  Laterality: N/A;  . LAPAROTOMY N/A 09/30/2020   Procedure: RE-EXPLORATORY LAPAROTOMY;  Surgeon: [name], MD;  Location: [facility];  Service: General;  Laterality: N/A;  . PACEMAKER IMPLANT    . PATCH ANGIOPLASTY N/A 09/21/2020   Procedure: PATCH ANGIOPLASTY SUPERIOR MESENTERIC VEIN;  Surgeon: Elam Dutch, MD;  Location: [facility] OR;  Service: Vascular;  Laterality: N/A;  . RADIOLOGY WITH ANESTHESIA N/A 09/16/2020   Procedure: RADIOLOGY WITH ANESTHESIA;  Surgeon: Radiologist, Medication, MD;  Location: [facility];  Service: Radiology;  Laterality: N/A;  . REMOVAL OF STONES  05/27/2020   Procedure: REMOVAL OF STONES;  Surgeon: Clarene Essex,  MD;  Location: [facility];  Service: Endoscopy;;  . repeat echo  2008    demonstrated near normalization  . SPHINCTEROTOMY  05/27/2020   Procedure: SPHINCTEROTOMY;  Surgeon: Clarene Essex, MD;  Location: [facility] END[facility];  Service: Endoscopy;;  . TRACHEOSTOMY TUBE PLACEMENT N/A 10/13/2020   Procedure: TRACHEOSTOMY;  Surgeon: [name], MD;  Location: [facility];  Service: General;  Laterality: N/A;  . TRANSTHORACIC ECHOCARDIOGRAM  11/09/2005, 01/15/2009, 02/07/2010  . UPPER ESOPHAGEAL ENDOSCOPIC ULTRASOUND (EUS) N/A 06/25/2020   Procedure: UPPER ESOPHAGEAL ENDOSCOPIC ULTRASOUND (EUS);  Surgeon: Arta Silence, MD;  Location: [facility];  Service: Endoscopy;  Laterality: N/A;  . WHIPPLE PROCEDURE N/A 10/01/2020   Procedure: WHIPPLE PROCEDURE;  Surgeon: [name], MD;  Location: [place];  Service: General;  Laterality: N/A;  ROOM 2 STARTING AT 07:30AM FOR 380 MIN     Home Medications:  Prior to Admission medications   Medication Sig Start Date End Date Taking? Authorizing Provider  acetaminophen (TYLENOL) 500 MG tablet  Take 2 tablets (1,000 mg total) by mouth every 6 (six) hours. Patient taking differently: Take 1,000 mg by mouth every 6 (six) hours as needed for mild pain. 08/22/20  Yes [name], MD  allopurinol (ZYLOPRIM) 100 MG tablet Take one tablet by mouth daily for 1 week and then 2 tablets by mouth daily Patient taking differently: Take 200 mg by mouth daily. 07/03/20  Yes Swords, Darrick Penna, MD  amiodarone (PACERONE) 200 MG tablet Take 1 tablet (200 mg total) by mouth daily. Patient taking differently: Take 200 mg by mouth daily with supper. 07/30/20  Yes Evans Lance, MD  aspirin 81 MG tablet Take 1 tablet (81 mg total) by mouth 2 (two) times daily. Patient taking differently: Take 81 mg by mouth daily. 09/22/15  Yes Funches, Josalyn, MD  bisoprolol (ZEBETA) 10 MG tablet Take 1 tablet by mouth once daily Patient taking differently: Take 10 mg by mouth daily. 08/25/20  Yes Evans Lance, MD  budesonide-formoterol [facility]) 80-4.5 MCG/ACT inhaler Inhale 2 puffs into the lungs 2 (two) times daily. Must keep upcoming office visit for refills Patient taking differently: Inhale 2 puffs into the lungs 2 (two) times daily as needed (for flares). 11/09/19  Yes Fulp, Cammie, MD  sacubitril-valsartan (ENTRESTO) 24-26 MG Take 1 tablet by mouth 2 (two) times daily. 06/25/20  Yes Evans Lance, MD  amiodarone (PACERONE) 200 MG tablet TAKE 1 TABLET (200 MG TOTAL) BY MOUTH DAILY. 07/30/20 07/30/21  Evans Lance, MD  amiodarone (PACERONE) 200 MG tablet TAKE 1 TABLET (200 MG TOTAL) BY MOUTH DAILY. 11/15/19 11/14/20  Evans Lance, MD  bisoprolol (ZEBETA) 10 MG tablet TAKE 1 TABLET (10 MG TOTAL) BY MOUTH DAILY. 11/15/19 11/14/20  Evans Lance, MD  oxyCODONE (OXY IR/ROXICODONE) 5 MG immediate release tablet Take 1-2 tablets (5-10 mg total) by mouth every 6 (six) hours as needed for severe pain. Patient not taking: Reported on 09/10/2020 08/22/20   [name], MD    Inpatient Medications: Scheduled Meds: .  sodium chloride   Intravenous Once  . sodium chloride   Intravenous Once  . chlorhexidine  15 mL Mouth Rinse BID  . Chlorhexidine Gluconate Cloth  6 each Topical Q0600  . digoxin  0.25 mg Intravenous Q6H  . heparin injection (subcutaneous)  5,000 Units Subcutaneous Q8H  . insulin aspart  0-20 Units Subcutaneous Q4H  . insulin aspart  2 Units Subcutaneous Q4H  . lidocaine  5 mL Intradermal Once  .  mouth rinse  15 mL Mouth Rinse q12n4p  . pantoprazole (PROTONIX) IV  40 mg Intravenous BID  . sodium chloride flush  10-40 mL Intracatheter Q12H  . sodium chloride flush  5 mL Intracatheter Q8H   Continuous Infusions: . sodium chloride 250 mL (10/23/20 0825)  . sodium chloride    . sodium chloride    . albumin human    . amiodarone 60 mg/hr (10/30/20 1711)   Followed by  . [START ON 10/31/2020] amiodarone    . amiodarone    . fat emul(INTRAlipid)    . norepinephrine (LEVOPHED) Adult infusion 10 mcg/min (10/30/20 1805)  . phenylephrine    . phenylephrine (NEO-SYNEPHRINE) Adult infusion 400 mcg/min (10/30/20 1802)  . promethazine (PHENERGAN) injection (IM or IVPB) Stopped (10/30/20 1442)  . TPN ADULT (ION)     PRN Meds: sodium chloride, sodium chloride, alteplase, fentaNYL (SUBLIMAZE) injection, heparin, ipratropium-albuterol, lidocaine (PF), lidocaine-prilocaine, ondansetron (ZOFRAN) IV, pentafluoroprop-tetrafluoroeth, polyvinyl alcohol, promethazine (PHENERGAN) injection (IM or IVPB), sodium chloride flush  Allergies:    Allergies  Allergen Reactions  . Ace Inhibitors Cough    Social History:   Social History   Socioeconomic History  . Marital status: Married    Spouse name: Not on file  . Number of children: Not on file  . Years of education: Not on file  . Highest education level: Not on file  Occupational History    Employer: [employer]  Tobacco Use  . Smoking status: Former Smoker    Packs/day: 0.25    Years: 2.00    Pack years: 0.50    Types: Cigarettes    Quit date:  07/12/1998    Years since quitting: 22.3  . Smokeless tobacco: Never Used  Vaping Use  . Vaping Use: Never used  Substance and Sexual Activity  . Alcohol use: No    Comment: Denies  . Drug use: No  . Sexual activity: Not on file  Other Topics Concern  . Not on file  Social History Narrative   The patient is married.  He is in native of [place],     living in [place] for 9 years.  He has a history of tobacco use,     but quit smoking a year ago.  Denies alcohol abuse.         Social Determinants of Health   Financial Resource Strain: Not on file  Food Insecurity: Not on file  Transportation Needs: Not on file  Physical Activity: Not on file  Stress: Not on file  Social Connections: Not on file  Intimate Partner Violence: Not on file    Family History:    Family History  Problem Relation Age of Onset  . Diabetes Father   . Heart Problems Father        unsure of what kind  . Asthma Maternal Uncle   . Diabetes Maternal Grandmother      ROS:  Please see the history of present illness.  General:no colds or fevers, no weight changes Skin:no rashes or ulcers HEENT:no blurred vision, no congestion CV:see HPI PUL:see HPI GI:no diarrhea constipation or melena, no indigestion- post whipple procedure and bowel necrosis.   GU:no hematuria, no dysuria no with AKI on HD MS:no joint pain, no claudication Neuro:no syncope, no lightheadedness Endo:no diabetes, no thyroid disease  All other ROS reviewed and negative.     Physical Exam/Data:   Vitals:   10/30/20 1645 10/30/20 1730 10/30/20 1745 10/30/20 1750  BP: (!) 88/50 (!) 98/47 (!) 71/44  Pulse: (!) 105 95 (!) 108   Resp: (!) 29 (!) 30 (!) 28   Temp:  99.2 F (37.3 C)    TempSrc:  Oral    SpO2: 96% 94% 90% 100%  Weight:      Height:        Intake/Output Summary (Last 24 hours) at 10/30/2020 1831 Last data filed at 10/30/2020 1600 Gross per 24 hour  Intake 2161.98 ml  Output 1925 ml  Net 236.98 ml   Last 3  Weights 10/30/2020 10/29/2020 10/29/2020  Weight (lbs) 196 lb 13.9 oz 197 lb 1.5 oz 201 lb 1 oz  Weight (kg) 89.3 kg 89.4 kg 91.2 kg     Body mass index is 25.28 kg/m.  General:  Well nourished, well developed, in no acute distress though anxious back on pressure support, bleeding at incision. Receiving Unit PRBCs.  HEENT: normal Lymph: no adenopathy Neck: no JVD Endocrine:  No thryomegaly Vascular: No carotid bruits; peda pulses 1+ bilaterally  Cardiac:  normal S1, S2; RRR; no murmur gallup rub or click Lungs:  Rhonchi throughout to auscultation bilaterally, no wheezing, + rhonchi no rales  Abd: soft, nontender, no hepatomegaly  Ext: no to trace edema Musculoskeletal:  No deformities, BUE and BLE strength normal and equal Skin: warm and dry  Neuro:  Alert and oriented, no focal abnormalities noted Psych:  Normal affect   Relevant CV Studies: Echo 09/26/20 IMPRESSIONS    1. Left ventricular ejection fraction, by estimation, is 40 to 45%. The  left ventricle has mildly decreased function. The left ventricle  demonstrates global hypokinesis. Left ventricular diastolic parameters are  consistent with Grade I diastolic  dysfunction (impaired relaxation).  2. Right ventricular systolic function is normal. The right ventricular  size is normal. Tricuspid regurgitation signal is inadequate for assessing  PA pressure.  3. The mitral valve is grossly normal. Trivial mitral valve  regurgitation.  4. The aortic valve is grossly normal. Aortic valve regurgitation is not  visualized. No aortic stenosis is present.  5. The inferior vena cava is normal in size with greater than 50%  respiratory variability, suggesting right atrial pressure of 3 mmHg.   Comparison(s): A prior study was performed on 05/25/20. Prior images  reviewed side by side. LV function has decreased slightly.   FINDINGS  Left Ventricle: Wall thickness not well visualized for measurement. Left  ventricular ejection  fraction, by estimation, is 40 to 45%. The left  ventricle has mildly decreased function. The left ventricle demonstrates  global hypokinesis. The left  ventricular internal cavity size was normal in size. Left ventricular  diastolic parameters are consistent with Grade I diastolic dysfunction  (impaired relaxation).   Right Ventricle: The right ventricular size is normal. Right vetricular  wall thickness was not well visualized. Right ventricular systolic  function is normal. Tricuspid regurgitation signal is inadequate for  assessing PA pressure.   Left Atrium: Left atrial size was normal in size.   Right Atrium: Right atrial size was normal in size.   Pericardium: There is no evidence of pericardial effusion.   Mitral Valve: The mitral valve is grossly normal. Trivial mitral valve  regurgitation.   Tricuspid Valve: The tricuspid valve is grossly normal. Tricuspid valve  regurgitation is trivial.   Aortic Valve: The aortic valve is grossly normal. Aortic valve  regurgitation is not visualized. No aortic stenosis is present.   Pulmonic Valve: The pulmonic valve was not well visualized. Pulmonic valve  regurgitation is trivial.   Aorta: The  aortic root is normal in size and structure.   Venous: The inferior vena cava is normal in size with greater than 50%  respiratory variability, suggesting right atrial pressure of 3 mmHg.   IAS/Shunts: The interatrial septum was not well visualized.   Additional Comments: A device lead is visualized in the right atrium and  right ventricle.     LEFT VENTRICLE  PLAX 2D  LVIDd:     4.40 cm   Diastology  LVIDs:     3.60 cm   LV e' medial:  7.18 cm/s  LVOT diam:   2.00 cm   LV E/e' medial: 8.6  LV SV:     57     LV e' lateral:  13.50 cm/s  LV SV Index:  26     LV E/e' lateral: 4.6  LVOT Area:   3.14 cm    LV Volumes (MOD)  LV vol d, MOD A2C: 98.0 ml  LV vol d, MOD A4C: 79.6 ml  LV vol s, MOD  A2C: 51.7 ml  LV vol s, MOD A4C: 52.3 ml  LV SV MOD A2C:   46.3 ml  LV SV MOD A4C:   79.6 ml  LV SV MOD BP:   37.0 ml   RIGHT VENTRICLE       IVC  RV S prime:   15.80 cm/s IVC diam: 1.30 cm  TAPSE (M-mode): 1.7 cm   LEFT ATRIUM       Index    RIGHT ATRIUM      Index  LA diam:    3.10 cm 1.40 cm/m RA Area:   13.80 cm  LA Vol (A2C):  48.5 ml 21.86 ml/m RA Volume:  31.90 ml 14.38 ml/m  LA Vol (A4C):  40.2 ml 18.12 ml/m  LA Biplane Vol: 46.4 ml 20.91 ml/m  AORTIC VALVE  LVOT Vmax:  104.00 cm/s  LVOT Vmean: 70.000 cm/s  LVOT VTI:  0.182 m    AORTA  Ao Root diam: 2.80 cm  Ao Asc diam: 2.90 cm   MITRAL VALVE  MV Area (PHT): 3.37 cm  SHUNTS  MV Decel Time: 225 msec  Systemic VTI: 0.18 m  MV E velocity: 62.10 cm/s Systemic Diam: 2.00 cm  MV A velocity: 92.50 cm/s  MV E/A ratio: 0.67   Laboratory Data:  High Sensitivity Troponin:  No results for input(s): TROPONINIHS in the last 720 hours.   Chemistry Recent Labs  Lab 10/28/20 0455 10/29/20 0652 10/30/20 0508 10/30/20 1757  NA 142 138 140 139  K 3.9 4.3 5.1 4.0  CL 101 101 105  --   CO2 25 26 23   --   GLUCOSE 187* 134* 106*  --   BUN 127* 91* 136*  --   CREATININE 6.76* 5.64* 7.74*  --   CALCIUM 8.7* 8.6* 9.4  --   GFRNONAA 9* 12* 8*  --   ANIONGAP 16* 11 12  --     Recent Labs  Lab 10/27/20 0457 10/28/20 0455 10/30/20 0508  PROT 7.4  --  7.9  ALBUMIN 1.3* 1.3* 1.3*  AST 112*  --  105*  ALT 125*  --  129*  ALKPHOS 82  --  93  BILITOT 3.6*  --  3.4*   Hematology Recent Labs  Lab 10/29/20 0652 10/30/20 0700 10/30/20 1301 10/30/20 1757  WBC 10.1 12.0* 19.8*  --   RBC 2.74* 2.65* 2.85*  --   HGB 7.4* 7.1* 7.7* 8.2*  HCT 24.6* 23.5* 24.9* 24.0*  MCV 89.8 88.7 87.4  --   MCH 27.0 26.8 27.0  --   MCHC 30.1 30.2 30.9  --   RDW 17.1* 17.1* 16.8*  --   PLT 183 172 211  --    BNPNo results for input(s): BNP, PROBNP in the last 168 hours.  DDimer  No results for input(s): DDIMER in the last 168 hours.   Radiology/Studies:  DG Chest 1 View  Result Date: 10/30/2020 CLINICAL DATA:  Shortness of breath. EXAM: CHEST  1 VIEW COMPARISON:  10/20/2020 FINDINGS: The right IJ catheter is stable. The right ventricular pacer wire/AICD is stable. The right PICC line is stable. The cardiac silhouette, mediastinal and hilar contours are normal. The lungs are clear. No pleural effusions, pulmonary edema or pneumothorax. IMPRESSION: No acute cardiopulmonary findings. Electronically Signed   By: Marijo Sanes M.D.   On: 10/30/2020 17:44     Assessment and Plan:   1. A fib RVR and SVT - had been on amiodarone as outpt but none recently due to illness.  Has had bolus and resumed IV amiodarone. His ICD has shocked him twice due to rate. Pt felt and RN was aware. Will have interrogated in AM.  With no small bowel cannot take oral meds.  Will ask pharmacy to give opinion on boluses of amiodarone IV.    Will have EP see in AM.  For now amiodarone in future may use BB but now on pressor.  Strips in shadow chart 2. Anticoagulation with CHA2DS2VASc now with anemia and unable to take oral agents.  possibility of lovenox in future.  3. Respiratory failure on pressure support with trach now. Per CCM 4. Hypotension on norepinephrine drip per CCM  5. Anemia with HGB at 7. 7 but now receiving 1 unit of PRBCs per CCM some bleeding at incision  6. AKI with need for HD had undergone today.    7. S/p whipple, rt colectomy and SMV repair for T3N0 neuroendocrine tumor, C/b postop hemorrhagic shock with DIC and subsequent diffuse small bowel necrosis, now s/p small bowel resection with external drainage of bile duct, pancreatic duct and stomach.  On TPN, G tube to gravity.    Risk Assessment/Risk Scores:          CHA2DS2-VASc Score = 2  This indicates a 2.2% annual risk of stroke. The patient's score is based upon: CHF History: Yes HTN History: Yes Diabetes History:  No Stroke History: No Vascular Disease History: No Age Score: 0 Gender Score: 0     For questions or updates, please contact [place] Please consult [email or web address] for contact info under    Signed, Cecilie Kicks, NP  10/30/2020 6:31 PM  History and all data above reviewed.  Patient examined.  I agree with the findings as above.  The patient has had a long complicated course as above.  Today he had hypotension and subsequent tachycardia with respiratory distress following.  During this acute on chronic decompensation he developed rapid narrow complex arrhythmia.  He had sinus tachycardia with SVT and possibly PAF.  He had some antitachycardia pacing noted and two shocks demonstrated on telemetry.  He had been off the vent and actually had his trach decannulated but was put back on the vent.  He has required vasopressor agents.  He is getting blood transfusion.  The patient exam reveals COR:RRR  ,  Lungs: Decreased breath sounds  ,  Abd: Absent bowel sounds, Ext  No edema.  .  All available labs, radiology testing,  previous records reviewed. Agree with documented assessment and plan.   Atrial arrhythmia:  He has SVT possibly atrial fib.  Unable to take anything other than IV meds.  He now has been loaded on amiodarone and willl have IV therapy.  We will have his device interrogated.  It might be possible, once he is off of pressors, to use low dose IV beta blocker for therapy.  We will discuss with our pharmacists whether there are any options for IV dosing on a scheduled (guided by amio levels) bolus paradigm.  He will not be able to take any PO therapy until he has bowel transplant if that is possible.  If he has recurrent atrial arrhythmia this evening he can have another 150 mg IV bolus.  Wean pressors as possible.  Agree with transfusion and respiratory support.    Jeneen Rinks Camia Dipinto  8:11 PM  10/30/2020

## 2020-10-30 NOTE — Progress Notes (Signed)
[facility] Progress Note     Assessment/ Plan:   1.Acute renal failure -likely has ATN With hypotensive episodes requiring Levophed in the setting of a very complicated surgical history with multiple visits to the OR + DIC + diffuse small bowel necrosis. Creatinine fluctuating represents compromised renal function with unfortunately repeated insults. No evidence of obstruction.   - Pt and family desire all aggressive interventions, confirmed 10/16/20 -startedCRRT 10/18/20-10/20/20  -Continue dialysis with intermittent HD on a  TTS schedule; dialysis today - Will continue monitoring for any signs of renal recovery; previously oliguric with 2 unmeasured urinary occurrences yesterday   2. Respiratory failure reintubated now on the ventilator--> s/p trach in OR 10/11/2020. Trach collar capped on 4/19.  3. Neuroendocrine tumor s/p Whipple with unfortunate extensive small bowel necrosis s/p resection. Family is hopeful for a small bowel transplant, however this is quite a long ways off d/t multiple issues. CT 4/6 showing possible pelvic abscess, previously on Zosyn  4. Severe malnutrition on TPN.  5. Anemia- Hgb 7.1  transfuse if <7.   6.   Hypokalemia. Resolved.    Subjective:   No complaints today. Planning to have trach removed later today.    Objective:   BP 92/71   Pulse (!) 105   Temp 97.7 F (36.5 C) (Oral)   Resp 19   Ht 6\' 2"  (1.88 m)   Wt 89.3 kg   SpO2 96%   BMI 25.28 kg/m   Intake/Output Summary (Last 24 hours) at 10/30/2020 0949 Last data filed at 10/30/2020 0800 Gross per 24 hour  Intake 2198.14 ml  Output 1220 ml  Net 978.14 ml   Weight change: -2.7 kg  Physical Exam:  GEN: resting in bed, in no acute distress  CV: regular rate and rhythm, no murmur, right IJ  RESP: no increased work of breathing, intermittent rhonchi MSK: no LE edema SKIN: warm, dry     Imaging: No results found.  Labs: BMET Recent Labs  Lab 10/25/20 1716  10/26/20 0458 10/26/20 1600 10/27/20 0457 10/28/20 0455 10/29/20 0652 10/30/20 0508  NA 139 141 138 140 142 138 140  K 3.9 4.0 3.6 3.8 3.9 4.3 5.1  CL 99 100 101 99 101 101 105  CO2 25 26 28 28 25 26 23   GLUCOSE 142* 168* 143* 156* 187* 134* 106*  BUN 108* 131* 54* 81* 127* 91* 136*  CREATININE 5.63* 6.62* 3.57* 4.88* 6.76* 5.64* 7.74*  CALCIUM 8.4* 8.6* 8.5* 8.7* 8.7* 8.6* 9.4  PHOS 4.9* 5.1* 2.8 3.6 4.1 3.2 4.1   CBC Recent Labs  Lab 10/24/20 0457 10/27/20 0457 10/29/20 0652 10/30/20 0700  WBC 15.3* 11.6* 10.1 12.0*  NEUTROABS  --  8.0*  --   --   HGB 9.7* 8.1* 7.4* 7.1*  HCT 29.8* 25.8* 24.6* 23.5*  MCV 86.6 87.2 89.8 88.7  PLT 351 244 183 172    Medications:    . chlorhexidine  15 mL Mouth Rinse BID  . Chlorhexidine Gluconate Cloth  6 each Topical Q0600  . Chlorhexidine Gluconate Cloth  6 each Topical Q0600  . heparin injection (subcutaneous)  5,000 Units Subcutaneous Q8H  . insulin aspart  0-20 Units Subcutaneous Q4H  . insulin aspart  2 Units Subcutaneous Q4H  . mouth rinse  15 mL Mouth Rinse q12n4p  . pantoprazole (PROTONIX) IV  40 mg Intravenous BID  . sodium chloride flush  10-40 mL Intracatheter Q12H  . sodium chloride flush  5 mL Intracatheter Q8H  Lyndee Hensen, DO 10/30/2020, 9:49 AM

## 2020-10-30 NOTE — Progress Notes (Signed)
This nurse spoke with [employer] representative. She states at this time "hold off" on TPN. At this time declines PIV to be placed at this time. Instructed to notify VAST when ready for TPA to be hung or with other needs. VU. Fran Lowes, RN VAST

## 2020-10-30 NOTE — Progress Notes (Signed)
Discussed with patient's family. They have had a discussion together and his brother confirmed with me face-to-face that they would like to have shocks delivered if the patient needs it. Code status changed to full code.

## 2020-10-30 NOTE — Progress Notes (Signed)
I spoke with the patient about current condition and treatment plan.  I spoke candidly about my hope that we can support his body through this, but also about my concerns about his body enduring this present decompensation and guarded prognosis overall. Dr. Zenia Resides and Anderson Malta, RN present   Pain is better after fentanyl administration. Pt does endorse feeling very worn out now. All questions answered, leaving so pt can rest.    -Cont levo -Amio -PRBC and FFP as ordered (POCUS looked like he was dry-- has new bloody outpt from drains, and continues to be anemic)  -formal ECHO  -continue emotional support  -2g mag   [name] MSN, AGACNP-BC [place] [phone] If no answer, [phone] 10/30/2020, 6:49 PM

## 2020-10-30 NOTE — Progress Notes (Signed)
Pt BP dropped to 75/61 with HR 140-150s. Pt complains of intense pain in lower left abdominal area. Of note, patient's pancreatic drain is blood tinged and wound vac is leaking serosanguinous fluid. Contacted Dr. Zenia Resides who provided VO for 1040mL NS at 929mL/hr; stat CBC; and [place] change.   Began bolus and drew CBC, waiting for BP to increase prior to [facility] change so pain medication can be administered.  Will continue to monitor.

## 2020-10-30 NOTE — Progress Notes (Signed)
Nutrition Follow-up  DOCUMENTATION CODES:   Not applicable  INTERVENTION:   TNA to meet nutrition needs 20% intralipid 250 ml/12 hr every Mon, Thur to prevent EFAD   Pt at high risk of malnutrition due to catabolic state.   NUTRITION DIAGNOSIS:   Increased nutrient needs related to post-op healing as evidenced by estimated needs. Ongoing.   GOAL:   Patient will meet greater than or equal to 90% of their needs Met on TNA.   MONITOR:   Vent status,Labs  REASON FOR ASSESSMENT:   Ventilator    ASSESSMENT:   Pt with a PMH of HTN, HLD, HF with reduced EF, AICD and dx mass adjacent to head of the pancreas in 2021.  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  Developed cholecystitis in Feb 2022 and had percutaneous cholecystectomy tube placement.  Presented this admission for neuroendocrine tumor resection with Whipple procedure, also had right total colectomy, colostomy and cholecystectomy on 10/09/2020.  SMV was lacerated during procedure and repaired by bovine pericardial patch.  Developed hemorrhagic shock with bile leak post-op and underwent placement of biliary T tube with abdominal washout on 3/15.    Pt discussed during ICU rounds and with RN. Per CCM [facility] evaluating for SB transplant.  Per MD tolerating iHD. Plan for CIR next week. Noted total drain output < 1 L currently.  Later noted documentation from RN - pt developed hypotension requiring addition of levophed   3/14 s/p whipple; intra-operative SMV laceration s/p repair; massive transfusion protocol - 120 products; maxed on pressors  3/15 s/p washout and VAC placement  3/16 off pressors; VAC output decreasing, hematemesis with NG not functioning, OG placed 3/17 s/p re-opening of recent laparotomy, SBR, VAC placement (per OR report pt with SB necrosis, only viable areas are stomach pb limb, 40-50 cm beyond GJ anastomosis, and colon) Pt will be TPN dependent. Concern that PB limb may become  necrotic.  3/18 TPN initiated @ 45 ml/hr with plans to advance to 90 ml on 3/19 3/22 entirety of SB resected due to necrosis.  3/25 lipids removed from TPN due to elevated TG 3/30 extubated  4/5 re-intubated 4/9-4/12 CRRT 4/13 iHD 4/21 decannulated   Medications reviewed and include: SSI, novolog, protonix  Phenergan IV PRN Levophed @ 4 mcg TPN @ 95 ml/hr Labs reviewed: BUN: 136 (preHD), Cr: 7.74, TG: 740 --> 227 CBG's: 151   Output:  UOP: unmeasured x 2   Drain 3: 225 ml  LLQ drain: 225 ml  R drain: 80 ml  RUQ drain: 0 ml  RLQ drain: 215 ml  20 F G tube: 0 ml Abd VAC: 0 ml Total output: 745 ml  UF: 0 ml I&O: -6.9 L  TPN @ 95 ml/hr (250 ml 20% intralipid Mon, Thur) 7 day average provides: 2869 kcal and 187 grams protein   Diet Order:   Diet Order            Diet NPO time specified  Diet effective now                 EDUCATION NEEDS:   No education needs have been identified at this time  Skin:  Skin Assessment: Skin Integrity Issues: Skin Integrity Issues:: Stage III Stage III: neck Wound Vac: NA Incisions: abdomen, neck  Last BM:  none  Height:   Ht Readings from Last 1 Encounters:  10/14/20 6' 2"  (1.88 m)    Weight:   Wt Readings from Last 1  Encounters:  10/30/20 89.3 kg    Ideal Body Weight:     BMI:  Body mass index is 25.28 kg/m.  Estimated Nutritional Needs:   Kcal:  2800-3000  Protein:  185-210 grams  Fluid:  > 2L/day  Lockie Pares., RD, LDN, CNSC See AMiON for contact information

## 2020-10-30 NOTE — Progress Notes (Signed)
Patient BP 75/65 following 1L bolus of NS. Contacted Dr. Zenia Resides who gave VO to begin levophed gtt.

## 2020-10-31 ENCOUNTER — Telehealth: Payer: Self-pay | Admitting: Family Medicine

## 2020-10-31 ENCOUNTER — Inpatient Hospital Stay (HOSPITAL_COMMUNITY): Payer: Managed Care, Other (non HMO)

## 2020-10-31 DIAGNOSIS — J9601 Acute respiratory failure with hypoxia: Secondary | ICD-10-CM | POA: Diagnosis not present

## 2020-10-31 DIAGNOSIS — D3A8 Other benign neuroendocrine tumors: Secondary | ICD-10-CM | POA: Diagnosis not present

## 2020-10-31 DIAGNOSIS — I4891 Unspecified atrial fibrillation: Secondary | ICD-10-CM | POA: Diagnosis not present

## 2020-10-31 DIAGNOSIS — Z930 Tracheostomy status: Secondary | ICD-10-CM

## 2020-10-31 DIAGNOSIS — I5022 Chronic systolic (congestive) heart failure: Secondary | ICD-10-CM | POA: Diagnosis not present

## 2020-10-31 LAB — RENAL FUNCTION PANEL
Albumin: 1.2 g/dL — ABNORMAL LOW (ref 3.5–5.0)
Anion gap: 11 (ref 5–15)
BUN: 64 mg/dL — ABNORMAL HIGH (ref 6–20)
CO2: 15 mmol/L — ABNORMAL LOW (ref 22–32)
Calcium: 7 mg/dL — ABNORMAL LOW (ref 8.9–10.3)
Chloride: 110 mmol/L (ref 98–111)
Creatinine, Ser: 4.48 mg/dL — ABNORMAL HIGH (ref 0.61–1.24)
GFR, Estimated: 15 mL/min — ABNORMAL LOW (ref >60)
Glucose, Bld: 152 mg/dL — ABNORMAL HIGH (ref 70–99)
Phosphorus: 5.6 mg/dL — ABNORMAL HIGH (ref 2.5–4.6)
Potassium: 4.4 mmol/L (ref 3.5–5.1)
Sodium: 136 mmol/L (ref 135–145)

## 2020-10-31 LAB — GLUCOSE, CAPILLARY
Glucose-Capillary: 123 mg/dL — ABNORMAL HIGH (ref 70–99)
Glucose-Capillary: 132 mg/dL — ABNORMAL HIGH (ref 70–99)
Glucose-Capillary: 97 mg/dL (ref 70–99)
Glucose-Capillary: 140 mg/dL — ABNORMAL HIGH (ref 70–99)
Glucose-Capillary: 116 mg/dL — ABNORMAL HIGH (ref 70–99)
Glucose-Capillary: 142 mg/dL — ABNORMAL HIGH (ref 70–99)

## 2020-10-31 LAB — PREPARE FRESH FROZEN PLASMA

## 2020-10-31 LAB — BPAM RBC
Blood Product Expiration Date: 202205202359
Blood Product Expiration Date: 202205202359
Blood Product Expiration Date: 202205202359
ISSUE DATE / TIME: 202204211720
ISSUE DATE / TIME: 202204211802
ISSUE DATE / TIME: 202204211802
Unit Type and Rh: 5100
Unit Type and Rh: 5100
Unit Type and Rh: 5100

## 2020-10-31 LAB — TYPE AND SCREEN
ABO/RH(D): O NEG
Antibody Screen: NEGATIVE
Unit division: 0
Unit division: 0
Unit division: 0

## 2020-10-31 LAB — HEMOGLOBIN AND HEMATOCRIT, BLOOD
HCT: 26.5 % — ABNORMAL LOW (ref 39.0–52.0)
HCT: 24.8 % — ABNORMAL LOW (ref 39.0–52.0)
HCT: 23.4 % — ABNORMAL LOW (ref 39.0–52.0)
Hemoglobin: 8.6 g/dL — ABNORMAL LOW (ref 13.0–17.0)
Hemoglobin: 7.9 g/dL — ABNORMAL LOW (ref 13.0–17.0)
Hemoglobin: 7.7 g/dL — ABNORMAL LOW (ref 13.0–17.0)

## 2020-10-31 LAB — ECHOCARDIOGRAM LIMITED
AR max vel: 1.83 cm2
AV Area VTI: 1.89 cm2
AV Area mean vel: 1.75 cm2
AV Mean grad: 8 mmHg
AV Peak grad: 13.5 mmHg
Ao pk vel: 1.84 m/s
Area-P 1/2: 3.99 cm2
Calc EF: 42.2 %
Height: 74 in
MV VTI: 1.45 cm2
S' Lateral: 5.35 cm
Single Plane A2C EF: 42.8 %
Single Plane A4C EF: 39.8 %
Weight: 3432.12 oz

## 2020-10-31 LAB — BPAM FFP
Blood Product Expiration Date: 202204262359
Blood Product Expiration Date: 202204262359
ISSUE DATE / TIME: 202204211808
ISSUE DATE / TIME: 202204211808
Unit Type and Rh: 600
Unit Type and Rh: 6200

## 2020-10-31 LAB — MAGNESIUM
Magnesium: 1.6 mg/dL — ABNORMAL LOW (ref 1.7–2.4)
Magnesium: 1.9 mg/dL (ref 1.7–2.4)

## 2020-10-31 MED ORDER — MAGNESIUM SULFATE 2 GM/50ML IV SOLN
2.0000 g | Freq: Once | INTRAVENOUS | Status: AC
  Administered 2020-10-31: 2 g via INTRAVENOUS
  Filled 2020-10-31: qty 50

## 2020-10-31 MED ORDER — PHENYLEPHRINE HCL-NACL 20-0.9 MG/250ML-% IV SOLN
0.0000 ug/min | INTRAVENOUS | Status: DC
  Administered 2020-10-31: 130 ug/min via INTRAVENOUS
  Administered 2020-10-31: 160 ug/min via INTRAVENOUS
  Filled 2020-10-31 (×4): qty 250

## 2020-10-31 MED ORDER — ZINC CHLORIDE 1 MG/ML IV SOLN
INTRAVENOUS | Status: AC
  Filled 2020-10-31: qty 1246.4

## 2020-10-31 MED ORDER — FAT EMULSION PLANT BASED 20% (INTRALIPID)IV EMUL
250.0000 mL | INTRAVENOUS | Status: AC
  Administered 2020-10-31 (×2): 250 mL via INTRAVENOUS
  Filled 2020-10-31: qty 250

## 2020-10-31 NOTE — Progress Notes (Incomplete)
  Echocardiogram 2D Echocardiogram has been performed.  Cammy Brochure 10/31/2020, 9:37 AM

## 2020-10-31 NOTE — Progress Notes (Addendum)
PHARMACY - TOTAL PARENTERAL NUTRITION CONSULT NOTE  Indication:  Short bowel syndrome  Patient Measurements: Height: 6\' 2"  (188 cm) Weight: 124.9 kg (275 lb 5.7 oz) IBW/kg (Calculated) : 82.2 TPN AdjBW (KG): 95.3 Body mass index is 35.35 kg/m.  Assessment:  79 YOM with history of mass adjacent to head of the pancreas in 2021. 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  Developed cholecystitis in Feb 2022 and had percutaneous cholecystectomy tube placement.  Presented this admission for neuroendocrine tumor resection with Whipple procedure, also had right total colectomy, colostomy and cholecystectomy on 14-Oct-2020. SMV was lacerated during procedure and repaired by bovine pericardial patch. Developed hemorrhagic shock with bile leak post-op and underwent placement of biliary T tube with abdominal washout on 3/15 PM. Antimicrobial therapies discontinued 4/20.  Pharmacy consulted to manage TPN.  Of note: TPN was not hung yesterday evening in order to preserve lines to allow for transfusion.   Glucose / Insulin: no hx DM, A1c 5.2%. CBGs controlled. Utilized 15 units SSI in the past 24h hours, 60 units in TPN (not given yesterday), Novolog 2 units Q4H (hold for CBG < 150).   CV:  Electrolytes: Na 136, K 4.4, CO2 down to 15, CoCa down to 8.8, Phos 5.6 others WNL Renal: CRRT started 4/9 > 4/11, now on iHD TTS (4K baths), BUN down to 64 after HD yesterday  Hepatic: AST105/ALT 129/Tbili 3.4  Albumin 1.2, prealbumin 18.5  TG peaked at 740, now at 328 on 4/14 - Intralipids 100g/wk and monitor TG trends Intake / Output; MIVF: UOP 0.1 ml/kg/hr, drains (5 total): 1468ml, 50cc from gastrostomy, net +3.4L since admit    GI Imaging:  4/6 CT - pelvic fluid collection concerning for abscess 4/12 CT - no new fluid collections GI Surgeries / Procedures:  3/14 Whipple procedure with R hemicolectomy and end ileostomy, cholecystectomy 3/15 washout, VAC placement 3/17 washout, VAC  replacement.  Necrosis of entire small bowel except for the PB limb and proximal 40-50cm of jejunum 3/19 re-opening laparotomy, abd closure.  Necrotic SB. 3/21 SB resection, take down of choledochojejunal anastomosis and pancreaticojejunal anastomosis, take down of duodenojejunal anastomosis, placement of externalized biliary drain / Stamm gastrostomy tube, IA washout and placement of intraperitoneal drains 3/30 extubation 4/8 trach 4/12 left lateral abdominal drain placed by IR  Central access: R IJ CVC placed 3/15; changed to PICC placed 10/03/20 TPN start date: 10/09/2020  Nutritional Goals (RD rec on 4/14): 2800-3000 kCal, 185-210g AA, fluid >2L/day  Current Nutrition:  TPN  Plan:  - Continue concentrated TPN at 95 ml/hr - Intralipid 20% 258mL Mon/Thurs for now to prevent EFAD. Will give a dose today since patient missed yesterday's dose  - TPN with Intralipid 2x/wk provides a daily average of 187g AA and 2869 kCal, meeting 100% of patient needs.  - Electrolytes in TPN: Na 147mEq/L, Decrease K to 5 mEq/L (11.4 mEq total), remove Ca, Mag 75mEq/L, Remove phos,  Change back to max acetate  -Mg Sulfate 2 gm IV x 1 dose today. Approved by nephrology. Will need to monitor closely  - Daily multivitamin in TPN.  Remove standard trace elements and add back zinc 5mg , and selenium 48mcg.  Remove chromium while on RRT. - Continue resistant SSI Q4H,continue 60u insulin regular in TPN, Novolog 2 units SQ Q4H. - Renal function daily -Planning for tunneled line placement today in IR   Thank you for involving pharmacy in this patient's care.  Albertina Parr, PharmD., BCPS, BCCCP Clinical  Pharmacist Please refer to [facility] for unit-specific pharmacist

## 2020-10-31 NOTE — Progress Notes (Addendum)
Progress Note  Patient Name: [name] Date of Encounter: 10/31/2020  Primary Cardiologist: Cristopher Peru, MD  Subjective   Remains ill on trache collar. Nods to abdominal discomfort but denies CP/SOB.  Inpatient Medications    Scheduled Meds: . sodium chloride   Intravenous Once  . sodium chloride   Intravenous Once  . chlorhexidine  15 mL Mouth Rinse BID  . Chlorhexidine Gluconate Cloth  6 each Topical Q0600  . heparin injection (subcutaneous)  5,000 Units Subcutaneous Q8H  . insulin aspart  0-20 Units Subcutaneous Q4H  . insulin aspart  2 Units Subcutaneous Q4H  . lidocaine  5 mL Intradermal Once  . mouth rinse  15 mL Mouth Rinse q12n4p  . pantoprazole (PROTONIX) IV  40 mg Intravenous BID  . sodium chloride flush  10-40 mL Intracatheter Q12H  . sodium chloride flush  5 mL Intracatheter Q8H   Continuous Infusions: . sodium chloride 250 mL (10/23/20 0825)  . sodium chloride    . sodium chloride    . albumin human    . amiodarone 30 mg/hr (10/31/20 0900)  . fat emul(INTRAlipid)    . norepinephrine (LEVOPHED) Adult infusion 5 mcg/min (10/31/20 0900)  . phenylephrine (NEO-SYNEPHRINE) Adult infusion    . promethazine (PHENERGAN) injection (IM or IVPB) Stopped (10/30/20 1442)  . TPN ADULT (ION)    . TPN ADULT (ION)     PRN Meds: sodium chloride, sodium chloride, alteplase, fentaNYL (SUBLIMAZE) injection, heparin, ipratropium-albuterol, lidocaine (PF), lidocaine-prilocaine, ondansetron (ZOFRAN) IV, pentafluoroprop-tetrafluoroeth, polyvinyl alcohol, promethazine (PHENERGAN) injection (IM or IVPB), sodium chloride flush   Vital Signs    Vitals:   10/31/20 0715 10/31/20 0732 10/31/20 0800 10/31/20 0900  BP: 133/72 114/84 135/71 115/64  Pulse: 67 70 66 69  Resp: (!) 22 (!) 24 (!) 27 (!) 23  Temp:   99.3 F (37.4 C)   TempSrc:   Oral   SpO2: 100% 94% 93% 95%  Weight:      Height:        Intake/Output Summary (Last 24 hours) at 10/31/2020 0933 Last data filed at  10/31/2020 0900 Gross per 24 hour  Intake 9630.86 ml  Output 2605 ml  Net 7025.86 ml   Last 3 Weights 10/31/2020 10/30/2020 10/29/2020  Weight (lbs) 214 lb 8.1 oz 216 lb 4.3 oz 197 lb 1.5 oz  Weight (kg) 97.3 kg 98.1 kg 89.4 kg     Telemetry    NSR, occasional PVCs this AM - very brief episode of what looks like V pacing - Personally Reviewed  Physical Exam   GEN: Chronically ill appearing M  HEENT: Normocephalic, atraumatic, sclera iecteric Neck: No JVD or bruits. Cardiac: RRR no murmurs, rubs, or gallops.  Respiratory: Coarse BS anteriorly with trache collar, no wheezing rales or rhonchi. Breathing is unlabored. GI: Soft,non distended, wound vac in place on midline incision MS: no acute deformity. Extremities: No clubbing or cyanosis. No edema. Distal pedal pulses are 2+ and equal bilaterally. Neuro: Awake, alert. Does not talk with trache collar but nods/shakes head to questions with consistency. Follows commands. Psych:  Calm  Labs    High Sensitivity Troponin:  No results for input(s): TROPONINIHS in the last 720 hours.    Cardiac EnzymesNo results for input(s): TROPONINI in the last 168 hours. No results for input(s): TROPIPOC in the last 168 hours.   Chemistry Recent Labs  Lab 10/27/20 0457 10/28/20 0455 10/29/20 4970 10/30/20 0508 10/30/20 1757 10/30/20 2005 10/31/20 0602  NA 140 142   < >  140 139 137 136  K 3.8 3.9   < > 5.1 4.0 4.4 4.4  CL 99 101   < > 105  --  103 110  CO2 28 25   < > 23  --  19* 15*  GLUCOSE 156* 187*   < > 106*  --  83 152*  BUN 81* 127*   < > 136*  --  69* 64*  CREATININE 4.88* 6.76*   < > 7.74*  --  4.79* 4.48*  CALCIUM 8.7* 8.7*   < > 9.4  --  8.2* 7.0*  PROT 7.4  --   --  7.9  --   --   --   ALBUMIN 1.3* 1.3*  --  1.3*  --   --  1.2*  AST 112*  --   --  105*  --   --   --   ALT 125*  --   --  129*  --   --   --   ALKPHOS 82  --   --  93  --   --   --   BILITOT 3.6*  --   --  3.4*  --   --   --   GFRNONAA 14* 9*   < > 8*  --  14*  15*  ANIONGAP 13 16*   < > 12  --  15 11   < > = values in this interval not displayed.     Hematology Recent Labs  Lab 10/29/20 0652 10/30/20 0700 10/30/20 1301 10/30/20 1757 10/30/20 2005 10/31/20 0300  WBC 10.1 12.0* 19.8*  --   --   --   RBC 2.74* 2.65* 2.85*  --   --   --   HGB 7.4* 7.1* 7.7* 8.2* 9.5* 8.6*  HCT 24.6* 23.5* 24.9* 24.0* 29.2* 26.5*  MCV 89.8 88.7 87.4  --   --   --   MCH 27.0 26.8 27.0  --   --   --   MCHC 30.1 30.2 30.9  --   --   --   RDW 17.1* 17.1* 16.8*  --   --   --   PLT 183 172 211  --   --   --     BNPNo results for input(s): BNP, PROBNP in the last 168 hours.   DDimer No results for input(s): DDIMER in the last 168 hours.   Radiology    DG Chest 1 View  Result Date: 10/30/2020 CLINICAL DATA:  Shortness of breath. EXAM: CHEST  1 VIEW COMPARISON:  10/20/2020 FINDINGS: The right IJ catheter is stable. The right ventricular pacer wire/AICD is stable. The right PICC line is stable. The cardiac silhouette, mediastinal and hilar contours are normal. The lungs are clear. No pleural effusions, pulmonary edema or pneumothorax. IMPRESSION: No acute cardiopulmonary findings. Electronically Signed   By: Rudie Meyer M.D.   On: 10/30/2020 17:44    Cardiac Studies   2d echo 09/26/20 1. Left ventricular ejection fraction, by estimation, is 40 to 45%. The  left ventricle has mildly decreased function. The left ventricle  demonstrates global hypokinesis. Left ventricular diastolic parameters are  consistent with Grade I diastolic  dysfunction (impaired relaxation).  2. Right ventricular systolic function is normal. The right ventricular  size is normal. Tricuspid regurgitation signal is inadequate for assessing  PA pressure.  3. The mitral valve is grossly normal. Trivial mitral valve  regurgitation.  4. The aortic valve is grossly normal. Aortic valve regurgitation is  not  visualized. No aortic stenosis is present.  5. The inferior vena cava is  normal in size with greater than 50%  respiratory variability, suggesting right atrial pressure of 3 mmHg.   Comparison(s): A prior study was performed on 05/25/20. Prior images  reviewed side by side. LV function has decreased slightly.   Patient Profile     50 y.o. male with longstanding NICM, chronic systolic CHF, VT/VF with prior cardiac arrest 2011 (nonobstructive CAD by cath at that time - 20-30% ramus otherwise small vessels) s/p STJ ICD, HTN, HLD, neuroendocrine tumor. Underwent Whipple procedure 3/14 with intra-operative SMV laceration and repair by vascular with bovine pericardial patch. Complex hospital course since that time with worsening hemorrhagic shock and anemia, massive transfusion protocol, small bowel necrosis s/p resection, septic shock, tracheostomy, AKI, DIC. On 10/31/19 developed hypotension, AF RVR and 2 inappropriate ICD shocks for rapid atrial ib.  Assessment & Plan    1. Rapid atrial fib - inappropriate ICDx2 shock for this - with hypotension on pressors not felt to be a candidate for BB at present time - continue IV amiodarone for now - per nurse, pharmacy is looking into whether or not perhaps it could be reconstituted in TPN  - see EP note, appreciate their input - hold off anticoagulation give anemia and inability to take oral agents - can revisit as clinical scenario evolves  2. NICM/chronic systolic CHF - repeat limited echo pending - remains on pressor support therefore not on GDMT at this time  3. History of VT/VF - on amiodarone prior to admission, held in context of severe illness and now issue with resumption of oral due to s/b resection  4. Neuroendocrine tumor s/p Whipple with complex hospital course - per primary teams - persistent hypomagnesemia/hypocalcemia noted - will defer electrolyte management to primary teams  For questions or updates, please contact [place] Please consult [email or web address] for contact info under  Cardiology/STEMI.  Signed, Charlie Pitter, PA-C 10/31/2020, 9:33 AM     History and all data above reviewed.  Patient examined.  I agree with the findings as above.    Much better today.  Abdominal pain but no chest pain or SOB.  Rhythm is sinus.  Pressors still on but have been weaned.  Echo prelim with EF mildly reduced but not significantly different than previous last month.  The patient exam reveals COR:RRR  ,  Lungs: Decreased breath sounds at the bases.   ,  Abd: Absent bowel sounds, Ext No edema  .  All available labs, radiology testing, previous records reviewed. Agree with documented assessment and plan.  Tachycardia:  His rhythm is controlled with resuscitation yesterday including being back on the vent, getting blood and being on amiodarone.  BPs are still low so beta blocker IV scheduled is not an option.  Continue IV amiodarone infusion and with time we will consider some scheduled IV amio bolus plan but likely would do well with beta blockers only if pressure allows.  He will need ICD battery replacement in the future and EP can have him on the radar to consider the timing of this.    Jeneen Rinks Jennine Peddy  11:22 AM  10/31/2020

## 2020-10-31 NOTE — Progress Notes (Signed)
PT [name] was malfunctioning, Rt pulled and attempted to reinsert Arterial line. RT was not successful after multiple attempts, MD notified.

## 2020-10-31 NOTE — Progress Notes (Signed)
[facility] Progress Note     Assessment/ Plan:   1.Acute renal failure -likely has ATN With hypotensive episodes requiring Levophed in the setting of a very complicated surgical history with multiple visits to the OR + DIC + diffuse small bowel necrosis. Creatinine fluctuating represents compromised renal function with unfortunately repeated insults. No evidence of obstruction.   - Pt and family desire all aggressive interventions, confirmed 10/16/20 -startedCRRT 10/18/20-10/20/20  -Continue dialysis with intermittent HD on a  TTS schedule; dialysis yesterday with episodes of A. fib with RVR and hypotension. - Will continue monitoring for any signs of renal recovery; no documented urine output in the morning but 125 cc overnight.  He is oliguric.  2. Respiratory failure reintubated now on the ventilator--> s/p trach in OR 10/16/2020. Trach collar capped on 4/19 but had to be recannulated yesterday.   3. Neuroendocrine tumor s/p Whipple with unfortunate extensive small bowel necrosis s/p resection. Family is hopeful for a small bowel transplant, however this is quite a long ways off d/t multiple issues. CT 4/6 showing possible pelvic abscess, previously on Zosyn.   4. Severe malnutrition on TPN.  TPN held overnight  5. Anemia- Transfuse if <7.  Hemoglobin 8.6 after 2 units PRBCs yesterday.  6.   Hypokalemia. Resolved.   7.   A. fib RVR/SVT: Patient with history of VT and has ICD.  Monitor magnesium closely if repleted.  Plan per cardiology.  Subjective:   Patient expressed concerns of his health today.  He did not do so well on hemodialysis yesterday.   Objective:   BP 115/64   Pulse 69   Temp 99.3 F (37.4 C) (Oral)   Resp (!) 23   Ht 6\' 2"  (1.88 m)   Wt 97.3 kg   SpO2 95%   BMI 27.54 kg/m   Intake/Output Summary (Last 24 hours) at 10/31/2020 1014 Last data filed at 10/31/2020 0900 Gross per 24 hour  Intake 9440.94 ml  Output 2605 ml  Net 6835.94 ml    Weight change: 7.9 kg  Physical Exam:  GEN: Ill-appearing, no acute distress CV: Regular rate and rhythm, no murmurs RESP: no increased work of breathing, clear to ascultation bilaterally with no crackles, wheezes, or rhonchi, trach collar present ABD: Bowel sounds present. Soft, MSK: no lower extremity edema SKIN: warm, dry PYSCH: depressed mood      Imaging: DG Chest 1 View  Result Date: 10/30/2020 CLINICAL DATA:  Shortness of breath. EXAM: CHEST  1 VIEW COMPARISON:  10/20/2020 FINDINGS: The right IJ catheter is stable. The right ventricular pacer wire/AICD is stable. The right PICC line is stable. The cardiac silhouette, mediastinal and hilar contours are normal. The lungs are clear. No pleural effusions, pulmonary edema or pneumothorax. IMPRESSION: No acute cardiopulmonary findings. Electronically Signed   By: Marijo Sanes M.D.   On: 10/30/2020 17:44    Labs: BMET Recent Labs  Lab 10/26/20 0458 10/26/20 1600 10/27/20 0457 10/28/20 0455 10/29/20 0652 10/30/20 0508 10/30/20 1757 10/30/20 2005 10/31/20 0602  NA 141 138 140 142 138 140 139 137 136  K 4.0 3.6 3.8 3.9 4.3 5.1 4.0 4.4 4.4  CL 100 101 99 101 101 105  --  103 110  CO2 26 28 28 25 26 23   --  19* 15*  GLUCOSE 168* 143* 156* 187* 134* 106*  --  83 152*  BUN 131* 54* 81* 127* 91* 136*  --  69* 64*  CREATININE 6.62* 3.57* 4.88* 6.76* 5.64* 7.74*  --  4.79* 4.48*  CALCIUM 8.6* 8.5* 8.7* 8.7* 8.6* 9.4  --  8.2* 7.0*  PHOS 5.1* 2.8 3.6 4.1 3.2 4.1  --   --  5.6*   CBC Recent Labs  Lab 10/27/20 0457 10/29/20 0652 10/30/20 0700 10/30/20 1301 10/30/20 1757 10/30/20 2005 10/31/20 0300  WBC 11.6* 10.1 12.0* 19.8*  --   --   --   NEUTROABS 8.0*  --   --   --   --   --   --   HGB 8.1* 7.4* 7.1* 7.7* 8.2* 9.5* 8.6*  HCT 25.8* 24.6* 23.5* 24.9* 24.0* 29.2* 26.5*  MCV 87.2 89.8 88.7 87.4  --   --   --   PLT 244 183 172 211  --   --   --     Medications:    . sodium chloride   Intravenous Once  . sodium  chloride   Intravenous Once  . chlorhexidine  15 mL Mouth Rinse BID  . Chlorhexidine Gluconate Cloth  6 each Topical Q0600  . heparin injection (subcutaneous)  5,000 Units Subcutaneous Q8H  . insulin aspart  0-20 Units Subcutaneous Q4H  . insulin aspart  2 Units Subcutaneous Q4H  . lidocaine  5 mL Intradermal Once  . mouth rinse  15 mL Mouth Rinse q12n4p  . pantoprazole (PROTONIX) IV  40 mg Intravenous BID  . sodium chloride flush  10-40 mL Intracatheter Q12H  . sodium chloride flush  5 mL Intracatheter Q8H      Hanna Ra, DO 10/31/2020, 10:14 AM

## 2020-10-31 NOTE — Progress Notes (Addendum)
NAMEHOOVER GREWE, MRN:  [id number], DOB:  July 30, 1970, LOS: 73 ADMISSION DATE:  09/15/2020, CONSULTATION DATE:  10/31/20 REFERRING MD:  Anesthesia, CHIEF COMPLAINT:  Whipple, post-op shock   Brief History:  50 y.o. M with PMH of neuroendocrine tumor and underwent Whipple procedure 3/14 with intra-operative SMV laceration and repair by vascular with bovine pericardial patch.  Pt had worsening shock overnight and required three pressors despite massive transfusion protocol (received 150 blood products).  He was taken back to the OR for washout on 3/15 and returned to the ICU intubated.  Taken back 3/17 again found to have small bowel necrosis s/p resection.  Plans again to return 3/19 to OR.   Totality of small bowel has been resected. The case was discussed with [facility] who may consider eval for sm intestinal transplant pending clinical course.  Continuing aggressive supportive care.   Past Medical History:   has a past medical history of AICD (automatic cardioverter/defibrillator) present, Dyslipidemia, Heart failure with mildly reduced EF, History of kidney stones, HTN (hypertension), ventricular fibrillation, and Nonischemic cardiomyopathy.  Significant Hospital Events:  3/14: Admit to Surgery, to OR for whipple, SMV injury and repair, massive transfusion protocol overnight  3/15: Back to OR for washout, x2. IR for arteriogram. No major bleed source identified in OR, or arterial bleed w IR. Robust product resuscitation >75 products (+ TXA + novoseven) 3/16: off pressors, add'l 39 products overnight. Slowing resuscitation this morning with hemodynamic improvements and slowing drain output; OR x 2 - washout, biliary t tube, wound vac -- washout, wound vac, IR for arteriogram -- no arterial bleed for embolization 3/17:  Aggressive balanced transfusion continue overnight with marked hemodynamic improvement since yesterday evening. Off pressors x several hours, Wound vac output significantly slowing  (from 527ml q15-85min to 560ml q1hr+) and output is much thinner, Thin bloody secretions from mouth approx 260ml, Decreased RR from 22 to 18, Unable to tolerate NE- severe bradycardia 3/19 taken back to OR. Small bowel noted to be almost completely necrosed. Patient was closed with plans for goals of car discussion.  3/20 GOC family endorses full scope of treatment. Primary team discussed with [facility] the possibility of transfer for small bowel transplant. [facility] felt as though transfer would not change outcome. 3/21 Ongoing discussion with family and [facility]. Patient may be a candidate for transplant if he can stabilize post a small bowel resection. He was taken back to the OR and small bowel was resected.  3/23 weaning pressors. Versed off. Remains encephalopathic 3/24 off pressors. Low dose dilaudid gtt -- only weakly grimaces to pain. Changing sedation to precedex + PRN fentanyl. Long discussion with 2 brothers regarding clinical case -- tried to clarify that while the term "stable" has been used, in this instance is meaning that he has not declined from previous shift but is in fact still critically ill with multisystem organ dysfunction.  3/25 Cr and BUN increased. Off sedation  3/26 Pressors off, CT head benign, BUN/Cr continue to creep up 3/27 Tachycardia/HTN overnight improved with fentanyl gtt 2/28 PCCM sign off. Trauma to take over vent/CCM needs.  3/30 extubated 4/2 desaturated overnight to the 30s improved with BVM and NTS.  4/5 poor airway protection. PCCM consulted for re-intubation.  4/6 CT a/p Large RLL opacity, smaller LLL opacity. Post op changes. 5.4cm fluid collection in posterior pelvis, concerning for abscess. Started on Zosyn  4/7 got transfused overnight and then diuresed. Worse renal function 4/9 transfused again. HD line inserted and started on  CRRT 4/10 started on trach collar trials. 4/12 off CRRT and transitioned to IHD. Surgery placed abdominal drain with minimal output--  during procedure seemed c/w hematoma however since grew out klebsiella  4/13, 4/14 Tolerated dialysis 4/15 Placed on vent overnight for rest 4/16 Tolerating trach collar 4/17 got HD. Trach dislodged overnight, replaced with #6 cuffless shiley.  4/18 afebrile, trach collar + PMV. Up to chair with PT  4/21 called back for hypotension, Afib/RVR, increased output from his 5 drains and wound vac.Transfused 2 units, re-cannulated, on Levo and Neo.   Consults:  PCCM VVS  IR  Palliative  Procedures:  PICC 3/25 > JP x 2  Biliary drain Pancreatic stent drain ETT 2/24>>3/13; 3/15 >> 3/29;  4/5>>4/8 Trach 4/8> L lateral abdominal drain 4/12>  Significant Diagnostic Tests:  See radiology tab Most recent significant imaging:  CT a/p  4/12> redemonstrated 7.6 x 6 x 2.6 cm fluid collection R lobe of liver. Redomonstrated pelvic fluid collection 4.9 x 4.9cm, 11 x 10.1 x 3.4 cm ill defined fluid collection v hematoma in abdominal mesentery. 4 x 3.2 x 2.2 cm ill defined fluid collection in ventral aspect of abdominal mesentery. Post op changes in setting of whipple, total small bowel resection, pancreatic stent, multiple drains.   Micro Data:  See micro tab Most recent significant findings:   4/5 trach asp >> Klebsiella pneumoniae 4/12 Pelvic abscess >> ampicillin resistant Klebsiella pneumoniae 4/12 BCx> no growth   Antimicrobials:  Zosyn 3/21 > 3/29; 4/5 >> Eraxis 3/23> 3/29, 4/12>  Interim History / Subjective:   4/21>> De cannulated , HD pulled off 1 L. HGB 7.1>> Received 2 units PRBC's Tachy and hypotensive>> Now on Levo/ Neo/ Amio, fluid resuscitation with 1 L IVF Internal defibrillator discharged x 2 Increased drainage from 5 drains and wound vac 4/21 Re cannulated and placed on vent for short period Echo ordered and pending Revisited goals of care with family>> changed status to full code  CXR was clear 4/22 Today>> Back to ATC 28%, awake and alert, sore abdomen and chest ( ?  Secondary to defibrillation) Drains appear to have slowed in volume, but still some bloody color to drainage Remains on Levo at 5, Neo at 170 mcg, and Amio at 30.  Nursing is weaning presors + 3700 cc's  K 4.4, Na 136, CO2 15>> was 19 on 4/21 and was normal prior   Phos 5.6 No Mag today>> will checl as an add on  Remains on 28% ATC  Drain output last 24 L Pancreatic Drain/ stent >> 55 cc's overnight JP Left>> 5 cc's JP right >> 75cc's JP Right #2>>0 cc's Biliary Drain>>  160 cc's Wound Vac >> 70 cc's  Objective   Blood pressure 135/71, pulse 66, temperature 98.6 F (37 C), temperature source Oral, resp. rate (!) 27, height 6\' 2"  (1.88 m), weight 97.3 kg, SpO2 93 %.    Vent Mode: PSV;CPAP FiO2 (%):  [21 %-70 %] 21 % PEEP:  [5 cmH20] 5 cmH20 Pressure Support:  [10 cmH20] 10 cmH20 Plateau Pressure:  [19 cmH20] 19 cmH20   Intake/Output Summary (Last 24 hours) at 10/31/2020 0834 Last data filed at 10/31/2020 0800 Gross per 24 hour  Intake 9331.25 ml  Output 2605 ml  Net 6726.25 ml   Filed Weights   10/29/20 1503 10/30/20 0500 10/31/20 0500  Weight: 89.4 kg 98.1 kg 97.3 kg   Physical Exam:  Constitutional: ill appearing middle aged M reclied in bed in no distress  HEENT: temporal  muscle wasting. Pink dry mm  Cardiovascular:IRIR s1s2 cap refill M 3 sec   Respiratory: Bilateral chest excursion,  Clear throughout. No accessory muscle use, tender to abdomen and chest  Gastrointestinal: Soft abd. Midline surgical site.L  lower abd drain (pancreas) with bloody output larger amount., Additional drains with drainage amounts noted above Skin: c/d/w, normal turgor , Surgical incision and drains Neurologic: Awake.Alert,  Follows commands. Lethargic  Psych: Appears calm, states he is [facility] Problem list   Hemorrhagic shock hypophosphatemia Septic shock  Klebsiella pneumoniae pneumonia Acute hypoxemic respiratory failure requiring mechanical  ventilation  Assessment & Plan:   Afib RVR  SVT  -ICD has defib x 2, remains in unstable atrial tachy rhythm  Shock  P -amio, dig -electrolytes optimization ( Mag > 2 and K > 4) -ICU monitoring  -coox, ECHO >> pending 4/22 -Cont NE  - Increasing CO2 per bmet ? Reactive to events of 4/21>> Monitor  Acute respiratory failure with hypoxia  S/p Tracheostomy -- decanulated -- re-inserting 4/21  -aspiration PNA, improved -Klebsiella pneumoniae PNA, improved  Now resp distress related to hemodynamic perturbations P - ABG and CXR prn - Trach to ATC >> 28% - De-cannulate when able   Pancreatic neuroendocrine tumor s/p whipple and SMV repair 3/21. Post op course c/b hemorrhagic shock, small bowel necrosis s/p total small intestine resection -per CCS  -cont TPN. CCS plans for tunneled access -Family's hope is for transfer to [facility] for small intestine transplant. Suspect still significant progress to be made with acute issues before candidacy evaluation could occur. Am not sure how pts underlying NICM will impact candidacy, or current renal failure (if no renal recovery)  Klebsiella pneumoniae pelvic abscess  -s/p drain placement 4/12 with IR  P -cont zosyn, eraxis   Acute renal failure with oliguria, requiring RRT -probably ATN -did requiring CRRT initially,  Now on pressors may need CVVHD with next treatment  Due 4/23. -got HD 4/21 prior to decompensation -nephro following, plan for TTS iHD if tolerated  -monitoring for signs of renal recovery   Malnutrition Short gut syndrome  -in setting of total small bowel resection P -TPN dependent  Anemia, critical illness  -critical illness, renal failure  P - transfused 2 units  4/21 ( HGB up to 8.6) - CT abdomen and pelvis to look for source of bleed ( IV contrast only) - Trend CBC - Close monitoring of drainage from all drains    NICM s/p ICD  - Echo pending after events of 4/21 - Discharged  4/21 x 2 P -unable to do GDHFT  given GI abnormalities   GOC - Full code except and family requesting shocks ( Has ICD that is functioning properly) - The family hopes for small intestine transplant at [facility]. If not a feasible destination, would likely need to re-evaluate GOC - Pt would like to be aware/involved with decision making    Daily Goals Checklist  Pain/Anxiety/Delirium protocol (if indicated): PRN only for RASS goal 0 VAP protocol (if indicated): bundle in place.   DVT prophylaxis: SCD Nutrition Status: TPN  GI prophylaxis: Pantoprazole Urinary catheter: external Central lines: PICC 3/25 Glucose control: -SSI + TPN Mobility/therapy needs: PT Restraints: Restraint type:NA Reason for restraints:NA Daily labs- CMP Code Status: Partial - -no CV/DF (has an ICD however) Family Communication: per primary Disposition: ICU, hope is for CIR   Goals of Care:  Last date of multidisciplinary goals of care discussion: 72/5 Family and staff present: Dr. Zenia Resides  Summary of discussion: Family wishes for aggressive care.   Code Status:  Full Code as of 4/21- no CV/DF by [place] (has an ICD which is not to be turned off)    CCT: 50 min  Magdalen Spatz, MSN, AGACNP-BC [place] for personal pager PCCM on call pager [phone] Hospital use only>> Not for office patient use 10/31/2020, 8:34 AM

## 2020-10-31 NOTE — Progress Notes (Signed)
A-aline very positional and upon assessment it appeared to be pulled out a small amount. RT attempted to fix a-line but unsuccessful, therefore a-line removed.   Trauma notified and instructed to try and replace. However, if unable it will be okay.  Right arm is restricted, therefore the same arm will need to be reused.Pressures currently stable   Respiratory aware and will attempt to replace.

## 2020-10-31 NOTE — Progress Notes (Signed)
14 Days Post-Op  Subjective: Patient is in sinus rhythm this morning with rate in 60s. Had a fever yesterday evening while blood was infusing, afebrile since then. Hgb 8.6 this morning after 2u PRBCs. Remains on neo and levo. Patient is alert this morning, reports pain is improved from yesterday. Labs pending.  Objective: Vital signs in last 24 hours: Temp:  [97.6 F (36.4 C)-101.8 F (38.8 C)] 98.6 F (37 C) (04/22 0400) Pulse Rate:  [70-151] 80 (04/22 0445) Resp:  [11-32] 21 (04/22 0445) BP: (71-142)/(44-85) 129/77 (04/22 0445) SpO2:  [90 %-100 %] 99 % (04/22 0445) Arterial Line BP: (107-155)/(52-73) 107/61 (04/21 2345) FiO2 (%):  [50 %-70 %] 50 % (04/22 0447) Weight:  [97.3 kg] 97.3 kg (04/22 0500) Last BM Date:  (pta)  Intake/Output from previous day: 04/21 0701 - 04/22 0700 In: 7980.1 [I.V.:6750.8; Blood:1104.3; IV Piggyback:100] Out: 2605 [Urine:125; Drains:1480] Intake/Output this shift: Total I/O In: 7053.2 [I.V.:5873.9; Blood:1104.3; Other:25; IV Piggyback:50] Out: 90 [Urine:125; Drains:560]  PE: General: no acute distress Neuro: alert and oriented, no focal deficits HEENT: trach in place, site clean Resp: normal work of breathing on PSV CV: RRR Abdomen: soft, nondistended, wound vac in place on midline incision. RLQ ostomy site is clean and granulating. R JP drains with dark old blood with bilious tinge. Biliary drain with bile. LUQ pancreatic stent with clear colorless fluid, no blood. LLQ perc drain with old blood. G tube in place to gravity LUQ.   Lab Results:  Recent Labs    10/30/20 0700 10/30/20 1301 10/30/20 1757 10/30/20 2005 10/31/20 0300  WBC 12.0* 19.8*  --   --   --   HGB 7.1* 7.7*   < > 9.5* 8.6*  HCT 23.5* 24.9*   < > 29.2* 26.5*  PLT 172 211  --   --   --    < > = values in this interval not displayed.   BMET Recent Labs    10/30/20 0508 10/30/20 1757 10/30/20 2005  NA 140 139 137  K 5.1 4.0 4.4  CL 105  --  103  CO2 23  --   19*  GLUCOSE 106*  --  83  BUN 136*  --  69*  CREATININE 7.74*  --  4.79*  CALCIUM 9.4  --  8.2*   PT/INR Recent Labs    10/30/20 1722  LABPROT 16.5*  INR 1.3*   CMP     Component Value Date/Time   NA 137 10/30/2020 2005   NA 142 07/21/2020 0921   K 4.4 10/30/2020 2005   CL 103 10/30/2020 2005   CO2 19 (L) 10/30/2020 2005   GLUCOSE 83 10/30/2020 2005   BUN 69 (H) 10/30/2020 2005   BUN 16 07/21/2020 0921   CREATININE 4.79 (H) 10/30/2020 2005   CREATININE 1.16 03/01/2016 1607   CALCIUM 8.2 (L) 10/30/2020 2005   PROT 7.9 10/30/2020 0508   PROT 6.7 07/21/2020 0921   ALBUMIN 1.3 (L) 10/30/2020 0508   ALBUMIN 4.1 07/21/2020 0921   AST 105 (H) 10/30/2020 0508   ALT 129 (H) 10/30/2020 0508   ALKPHOS 93 10/30/2020 0508   BILITOT 3.4 (H) 10/30/2020 0508   BILITOT 0.8 07/21/2020 0921   GFRNONAA 14 (L) 10/30/2020 2005   GFRNONAA >89 09/22/2015 1008   GFRAA 82 07/21/2020 0921   GFRAA >89 09/22/2015 1008   Lipase     Component Value Date/Time   LIPASE 23 08/22/2020 0318       Studies/Results: DG  Chest 1 View  Result Date: 10/30/2020 CLINICAL DATA:  Shortness of breath. EXAM: CHEST  1 VIEW COMPARISON:  10/20/2020 FINDINGS: The right IJ catheter is stable. The right ventricular pacer wire/AICD is stable. The right PICC line is stable. The cardiac silhouette, mediastinal and hilar contours are normal. The lungs are clear. No pleural effusions, pulmonary edema or pneumothorax. IMPRESSION: No acute cardiopulmonary findings. Electronically Signed   By: Marijo Sanes M.D.   On: 10/30/2020 17:44    Assessment/Plan 50 yo male 1 month s/p Whipple, right colectomy and SMV repair for T3N0 neuroendocrine tumor. C/b postop hemorrhagic shock with DIC and subsequent diffuse small bowel necrosis, now s/p small bowel resection with external drainage of bile duct, pancreatic duct and stomach. - Drains no longer bloody this morning. Continue to monitor for signs of bleeding. INR 1.3  yesterday. - Intermittent HD TThS. - TPN held overnight to preserve IV access via PICC. Will resume this morning.  - G tube to gravity - Wean pressors as tolerated, slowly weaning - Formal echo today. Continue amio gtt. Cardiology following, appreciate recommendations. - Vac change MWF - Continue PT/OT as tolerated - VTE: SQH - Dispo: ICU    LOS: 39 days    Michaelle Birks, MD [facility] General, Hepatobiliary and Pancreatic Surgery 10/31/20 6:20 AM

## 2020-10-31 NOTE — Telephone Encounter (Signed)
Error

## 2020-10-31 NOTE — Progress Notes (Signed)
Inpatient Rehab Admissions Coordinator:   Pt. Is not medically ready for CIR at this time, but will follow for potential admit pending medical readiness, insurance authorization, and bed availability. I have also placed multiple calls to Pt.'s wife to confirm support at d/c and have not been able to reach her.   Clemens Catholic, [place], [place] Admissions Coordinator  [phone] ([place]) [phone] (office)

## 2020-10-31 NOTE — Progress Notes (Signed)
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 s/p right hemicolectomy, end ileostomy, Whipple and patch angioplasty repair of SMV. postoperative hemorrhagic shock with DIC with extensive small bowel necrosis. All of small bowel has been resected, with external drainage of the bile duct, pancreas and stomach. Extubated 3/30. Reintubated 4/5. S/p tracheostomy 4/8. Started on CRRT 4/9. Started on trach collar trials 4/10.  Discontinued CRRT 4/11. PMH including AICD, heart failure, HTN, and dyslipidemia.    PT Comments    The pt was eager to participate in therapy this morning despite yesterdays events. Pt able to complete a series of LE exercises with focus on ROM, activation, and moving through full ROM against min-mod resistance as tolerated. The pt reports increased pain with hip flexion, all exercises were kept in pain-free ROM to pt tolerance. The pt remains highly motivated, and will benefit from continued skilled PT to further progress strength and activity tolerance.     Follow Up Recommendations  CIR     Equipment Recommendations  Wheelchair (measurements PT);Wheelchair cushion (measurements PT);Hospital bed;Rolling walker with 5" wheels;3in1 (PT)    Recommendations for Other Services       Precautions / Restrictions Precautions Precautions: Fall Precaution Comments: Trach collar; x2 JP drains at abdomen and x2 biliary drains; wound vac Restrictions Weight Bearing Restrictions: No    Mobility  Bed Mobility Overal bed mobility: Needs Assistance             General bed mobility comments: totalA to reposition, further mobility deferred due to difficult day yesterday          Cognition Arousal/Alertness: Awake/alert Behavior During Therapy: WFL for tasks assessed/performed Overall  Cognitive Status: Impaired/Different from baseline Area of Impairment: Problem solving;Following commands                       Following Commands: Follows multi-step commands inconsistently     Problem Solving: Difficulty sequencing;Requires verbal cues General Comments: pt with good attempt to follow commands, does benefit from repeated cues or multimodal cues at times to complete movements as described by PT. very motivated, seemingly poor insight to severity of condition      Exercises General Exercises - Lower Extremity Ankle Circles/Pumps: AROM;Strengthening;Both;20 reps;Sidelying Quad Sets: AROM;Both;15 reps;Supine Short Arc Quad: AROM;Strengthening;Both;5 reps;Seated (x5 AROM, x5 against min manual resistance) Heel Slides: AROM;Strengthening;Both;5 reps;Seated (x5 AROM, x5 against min manual resistance) Hip ABduction/ADduction: AROM;Both;5 reps;Seated (SLR with hip abd/add)    General Comments General comments (skin integrity, edema, etc.): VSS on trach collar. increased secretions      Pertinent Vitals/Pain Pain Assessment: Faces Faces Pain Scale: Hurts a little bit Pain Location: abdomen, with hip flexion, Pain Descriptors / Indicators: Discomfort;Grimacing Pain Intervention(s): Limited activity within patient's tolerance;Monitored during session;Repositioned           PT Goals (current goals can now be found in the care plan section) Acute Rehab PT Goals Patient Stated Goal: Return to PLOF PT Goal Formulation: With patient Time For Goal Achievement: 11/10/20 Potential to Achieve Goals: Fair Progress towards PT goals: Progressing toward goals    Frequency    Min 3X/week      PT Plan Current plan remains appropriate       AM-PAC PT "6 Clicks" Mobility   Outcome Measure  Help needed turning from your back to  your side while in a flat bed without using bedrails?: A Little Help needed moving from lying on your back to sitting on the side of a  flat bed without using bedrails?: A Lot Help needed moving to and from a bed to a chair (including a wheelchair)?: A Lot Help needed standing up from a chair using your arms (e.g., wheelchair or bedside chair)?: A Lot Help needed to walk in hospital room?: Total Help needed climbing 3-5 steps with a railing? : Total 6 Click Score: 11    End of Session Equipment Utilized During Treatment: Oxygen Activity Tolerance: Patient tolerated treatment well Patient left: in bed;with call bell/phone within reach;with bed alarm set (echo present) Nurse Communication: Mobility status PT Visit Diagnosis: Muscle weakness (generalized) (M62.81);Difficulty in walking, not elsewhere classified (R26.2);Pain     Time: 6546-5035 PT Time Calculation (min) (ACUTE ONLY): 15 min  Charges:  $Therapeutic Exercise: 8-22 mins                    Karma Ganja, PT, DPT   Acute Rehabilitation Department Pager #: [phone]   Otho Bellows 10/31/2020, 12:34 PM

## 2020-10-31 NOTE — Progress Notes (Signed)
  Device interrogation performed.   Pt received inappropriate therapies for AF RVR in 210-230 bpm range.   Currently, VF detection is set to 207 bpm with h/o VT/VF in the past.    Unfortunately, we have no means of programming around inappropriate therapies at this time with single lead device and h/o VT/VF.   Increase detection threshold would prevent therapy for AF/RVR but could also result in under-sensing and not treating true VT/VF.   Re: Amiodarone [email or web address] Absorption of amiodarone has been shown to correlate with absorption of lipids in the duodenum and jejunum. It is unclear how much he will absorb without a small bowel. Suspect he may require larger doses chronically to obtain steady state.      With hypotension on pressors not currently candidate for BB. With renal failure not candidate for any other anti-arrhythmic.   Above discussed with Dr. Lovena Le who is his primary EP. Dr. Curt Bears is in the hospital today and will discuss further with him.   Pt has approximately 4-5 months until ERI.   He is critically ill with multiple system organ failure and prognosis remains guarded.   Legrand Como [address] [place], PA-C  10/31/2020 8:25 AM

## 2020-11-01 DIAGNOSIS — I472 Ventricular tachycardia: Secondary | ICD-10-CM

## 2020-11-01 DIAGNOSIS — N179 Acute kidney failure, unspecified: Secondary | ICD-10-CM | POA: Diagnosis not present

## 2020-11-01 DIAGNOSIS — R579 Shock, unspecified: Secondary | ICD-10-CM | POA: Diagnosis not present

## 2020-11-01 DIAGNOSIS — I480 Paroxysmal atrial fibrillation: Secondary | ICD-10-CM | POA: Diagnosis not present

## 2020-11-01 DIAGNOSIS — I420 Dilated cardiomyopathy: Secondary | ICD-10-CM

## 2020-11-01 DIAGNOSIS — D3A8 Other benign neuroendocrine tumors: Secondary | ICD-10-CM | POA: Diagnosis not present

## 2020-11-01 LAB — CBC
HCT: 25.2 % — ABNORMAL LOW (ref 39.0–52.0)
Hemoglobin: 7.1 g/dL — ABNORMAL LOW (ref 13.0–17.0)
MCH: 28.4 pg (ref 26.0–34.0)
MCHC: 28.2 g/dL — ABNORMAL LOW (ref 30.0–36.0)
MCV: 100.8 fL — ABNORMAL HIGH (ref 80.0–100.0)
Platelets: 105 10*3/uL — ABNORMAL LOW (ref 150–400)
RBC: 2.5 MIL/uL — ABNORMAL LOW (ref 4.22–5.81)
RDW: 18.1 % — ABNORMAL HIGH (ref 11.5–15.5)
WBC: 8 10*3/uL (ref 4.0–10.5)
nRBC: 0.5 % — ABNORMAL HIGH (ref 0.0–0.2)

## 2020-11-01 LAB — HEMOGLOBIN AND HEMATOCRIT, BLOOD
HCT: 22.7 % — ABNORMAL LOW (ref 39.0–52.0)
HCT: 25.1 % — ABNORMAL LOW (ref 39.0–52.0)
HCT: 24.8 % — ABNORMAL LOW (ref 39.0–52.0)
Hemoglobin: 7.6 g/dL — ABNORMAL LOW (ref 13.0–17.0)
Hemoglobin: 8.4 g/dL — ABNORMAL LOW (ref 13.0–17.0)
Hemoglobin: 8.1 g/dL — ABNORMAL LOW (ref 13.0–17.0)

## 2020-11-01 LAB — GLUCOSE, CAPILLARY
Glucose-Capillary: 118 mg/dL — ABNORMAL HIGH (ref 70–99)
Glucose-Capillary: 125 mg/dL — ABNORMAL HIGH (ref 70–99)
Glucose-Capillary: 125 mg/dL — ABNORMAL HIGH (ref 70–99)
Glucose-Capillary: 133 mg/dL — ABNORMAL HIGH (ref 70–99)
Glucose-Capillary: 140 mg/dL — ABNORMAL HIGH (ref 70–99)
Glucose-Capillary: 142 mg/dL — ABNORMAL HIGH (ref 70–99)

## 2020-11-01 LAB — COMPREHENSIVE METABOLIC PANEL
ALT: 121 U/L — ABNORMAL HIGH (ref 0–44)
AST: 116 U/L — ABNORMAL HIGH (ref 15–41)
Albumin: 1.2 g/dL — ABNORMAL LOW (ref 3.5–5.0)
Alkaline Phosphatase: 87 U/L (ref 38–126)
Anion gap: 13 (ref 5–15)
BUN: 110 mg/dL — ABNORMAL HIGH (ref 6–20)
CO2: 20 mmol/L — ABNORMAL LOW (ref 22–32)
Calcium: 8 mg/dL — ABNORMAL LOW (ref 8.9–10.3)
Chloride: 104 mmol/L (ref 98–111)
Creatinine, Ser: 6.74 mg/dL — ABNORMAL HIGH (ref 0.61–1.24)
GFR, Estimated: 9 mL/min — ABNORMAL LOW (ref >60)
Glucose, Bld: 108 mg/dL — ABNORMAL HIGH (ref 70–99)
Potassium: 3.7 mmol/L (ref 3.5–5.1)
Sodium: 137 mmol/L (ref 135–145)
Total Bilirubin: 2.8 mg/dL — ABNORMAL HIGH (ref 0.3–1.2)
Total Protein: 6.1 g/dL — ABNORMAL LOW (ref 6.5–8.1)

## 2020-11-01 LAB — MAGNESIUM: Magnesium: 2.5 mg/dL — ABNORMAL HIGH (ref 1.7–2.4)

## 2020-11-01 LAB — PHOSPHORUS: Phosphorus: 5.7 mg/dL — ABNORMAL HIGH (ref 2.5–4.6)

## 2020-11-01 MED ORDER — ZINC CHLORIDE 1 MG/ML IV SOLN
INTRAVENOUS | Status: AC
  Filled 2020-11-01: qty 1246.4

## 2020-11-01 MED ORDER — SODIUM CHLORIDE 0.9 % IV SOLN: 100.0000 mL | INTRAVENOUS | Status: DC | PRN

## 2020-11-01 MED ORDER — PENTAFLUOROPROP-TETRAFLUOROETH EX AERO: 1.0000 "application " | INHALATION_SPRAY | CUTANEOUS | Status: DC | PRN

## 2020-11-01 MED ORDER — LIDOCAINE-PRILOCAINE 2.5-2.5 % EX CREA
1.0000 | TOPICAL_CREAM | CUTANEOUS | Status: DC | PRN
Start: 2020-11-01 — End: 2020-11-01

## 2020-11-01 MED ORDER — CHLORHEXIDINE GLUCONATE CLOTH 2 % EX PADS: 6.0000 | MEDICATED_PAD | Freq: Every day | CUTANEOUS | Status: DC

## 2020-11-01 MED ORDER — ALTEPLASE 2 MG IJ SOLR: 2.0000 mg | Freq: Once | INTRAMUSCULAR | Status: DC | PRN

## 2020-11-01 MED ORDER — HEPARIN SODIUM (PORCINE) 1000 UNIT/ML DIALYSIS: 1000.0000 [IU] | INTRAMUSCULAR | Status: DC | PRN

## 2020-11-01 MED ORDER — LIDOCAINE HCL (PF) 1 % IJ SOLN: 5.0000 mL | INTRAMUSCULAR | Status: DC | PRN

## 2020-11-01 NOTE — Progress Notes (Signed)
PHARMACY - TOTAL PARENTERAL NUTRITION CONSULT NOTE  Indication:  Short bowel syndrome  Patient Measurements: Height: 6\' 2"  (188 cm) Weight: 124.9 kg (275 lb 5.7 oz) IBW/kg (Calculated) : 82.2 TPN AdjBW (KG): 95.3 Body mass index is 35.35 kg/m.  Assessment:  76 YOM with history of mass adjacent to head of the pancreas in 2021. 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  Developed cholecystitis in Feb 2022 and had percutaneous cholecystectomy tube placement.  Presented this admission for neuroendocrine tumor resection with Whipple procedure, also had right total colectomy, colostomy and cholecystectomy on 09/09/2020. SMV was lacerated during procedure and repaired by bovine pericardial patch. Developed hemorrhagic shock with bile leak post-op and underwent placement of biliary T tube with abdominal washout on 3/15 PM. Antimicrobial therapies discontinued 4/20.  Pharmacy consulted to manage TPN.  Glucose / Insulin: no hx DM, A1c 5.2%. CBGs controlled. Utilized 15 units SSI in the past 24h hours, 60 units in TPN (not given yesterday), Novolog 2 units Q4H (hold for CBG < 150).   CV: BP back to wnl - pressors weaned off Electrolytes: Na 137, K 3.7, CO2 low but up to 20, Cl wnl, Phos 5.7, Mg 2.5, CoCa 10.3  Renal: CRRT started 4/9 > 4/11, now on iHD TTS (4K baths), last 4/21, next planned 4/23 Hepatic: LFTs mildly elevated, Tbili down 2.8 Albumin 1.2, prealbumin 18.5  TG peaked at 740 (3/26) - last value down to 227 (4/21) - intralipids 100g/wk and monitor TG trends Intake / Output; MIVF: UOP 0.1 ml/kg/hr, drains (5 total): 143ml, 0 cc from gastrostomy, net +9.9L since admit    GI Imaging:  4/6 CT - pelvic fluid collection concerning for abscess 4/12 CT - no new fluid collections GI Surgeries / Procedures:  3/14 Whipple procedure with R hemicolectomy and end ileostomy, cholecystectomy 3/15 washout, VAC placement 3/17 washout, VAC replacement.  Necrosis of entire  small bowel except for the PB limb and proximal 40-50cm of jejunum 3/19 re-opening laparotomy, abd closure.  Necrotic SB. 3/21 SB resection, take down of choledochojejunal anastomosis and pancreaticojejunal anastomosis, take down of duodenojejunal anastomosis, placement of externalized biliary drain / Stamm gastrostomy tube, IA washout and placement of intraperitoneal drains 3/30 extubation 4/8 trach 4/12 left lateral abdominal drain placed by IR  Central access: R IJ CVC placed 3/15; changed to PICC placed 10/03/20 TPN start date: 2020/10/14  Nutritional Goals (RD rec on 4/21): 2800-3000 kCal, 185-210g AA, fluid >2L/day  Current Nutrition:  TPN  Plan:  - Continue concentrated TPN at 95 ml/hr - Intralipid 20% 264mL Mon/Thurs for now to prevent EFAD.  - TPN with Intralipid 2x/wk provides a daily average of 187g AA and 2869 kCal, meeting 100% of patient needs.  - Electrolytes in TPN: Na 165mEq/L, incr K to 7 mEq/L, cont with removed Ca/Phos, will cont with low dose Mg 2 mEq/L in setting of Afib but will remove if continues to rise tomorrow, continue max acetate  - Daily multivitamin in TPN.  Remove standard trace elements and add back zinc 5mg , and selenium 55mcg.  Remove chromium while on RRT. - Continue resistant SSI Q4H,continue 60u insulin regular in TPN, Novolog 2 units SQ Q4H. - Renal function daily -Planning for tunneled line placement today in IR   Thank you for allowing pharmacy to be a part of this patient's care.  Alycia Rossetti, PharmD, BCPS Clinical Pharmacist Clinical phone for 11/01/2020: [phone] 11/01/2020 7:30 AM   **Pharmacist phone directory can now be found on  [email or web address] ([email or web address]).  Listed under [facility].

## 2020-11-01 NOTE — Progress Notes (Signed)
Follow up - Trauma and Critical Care  Patient Details:    [name] is an 50 y.o. male.  Anti-infectives:  Anti-infectives (From admission, onward)   Start     Dose/Rate Route Frequency Ordered Stop   10/26/20 1000  piperacillin-tazobactam (ZOSYN) IVPB 2.25 g  Status:  Discontinued        2.25 g 100 mL/hr over 30 Minutes Intravenous Every 12 hours 10/25/20 2233 10/26/20 0715   10/26/20 0815  piperacillin-tazobactam (ZOSYN) IVPB 2.25 g  Status:  Discontinued        2.25 g 100 mL/hr over 30 Minutes Intravenous Every 8 hours 10/26/20 0715 10/29/20 0647   10/22/20 0930  anidulafungin (ERAXIS) 100 mg in sodium chloride 0.9 % 100 mL IVPB  Status:  Discontinued       "Followed by" Linked Group Details   100 mg 78 mL/hr over 100 Minutes Intravenous Every 24 hours 10/21/20 0840 10/29/20 0647   10/21/20 2200  piperacillin-tazobactam (ZOSYN) IVPB 2.25 g  Status:  Discontinued        2.25 g 100 mL/hr over 30 Minutes Intravenous Every 8 hours 10/21/20 1410 10/25/20 2233   10/21/20 1400  piperacillin-tazobactam (ZOSYN) IVPB 2.25 g  Status:  Discontinued        2.25 g 100 mL/hr over 30 Minutes Intravenous Every 8 hours 10/21/20 0742 10/21/20 1410   10/21/20 0930  anidulafungin (ERAXIS) 200 mg in sodium chloride 0.9 % 200 mL IVPB       "Followed by" Linked Group Details   200 mg 78 mL/hr over 200 Minutes Intravenous  Once 10/21/20 0840 10/21/20 1411   10/19/20 1600  piperacillin-tazobactam (ZOSYN) IVPB 3.375 g  Status:  Discontinued        3.375 g 100 mL/hr over 30 Minutes Intravenous Every 6 hours 10/19/20 1048 10/21/20 0742   10/18/20 1600  piperacillin-tazobactam (ZOSYN) IVPB 3.375 g  Status:  Discontinued        3.375 g 100 mL/hr over 30 Minutes Intravenous Every 6 hours 10/18/20 1123 10/19/20 1048   10/15/20 0400  piperacillin-tazobactam (ZOSYN) IVPB 3.375 g  Status:  Discontinued        3.375 g 12.5 mL/hr over 240 Minutes Intravenous Every 8 hours 10/14/20 2107 10/18/20 1123    10/14/20 2200  piperacillin-tazobactam (ZOSYN) IVPB 3.375 g        3.375 g 100 mL/hr over 30 Minutes Intravenous  Once 10/14/20 2107 10/14/20 2238   10/01/20 1100  anidulafungin (ERAXIS) 100 mg in sodium chloride 0.9 % 100 mL IVPB  Status:  Discontinued       "Followed by" Linked Group Details   100 mg 78 mL/hr over 100 Minutes Intravenous Every 24 hours 09/30/20 1014 10/08/20 0951   09/30/20 1100  anidulafungin (ERAXIS) 200 mg in sodium chloride 0.9 % 200 mL IVPB       "Followed by" Linked Group Details   200 mg 78 mL/hr over 200 Minutes Intravenous  Once 09/30/20 1014 09/30/20 1516   09/20/2020 1600  vancomycin (VANCOREADY) IVPB 1000 mg/200 mL  Status:  Discontinued        1,000 mg 200 mL/hr over 60 Minutes Intravenous Every 24 hours 10/04/2020 1137 10/02/2020 1510   09/28/2020 1400  piperacillin-tazobactam (ZOSYN) IVPB 3.375 g  Status:  Discontinued        3.375 g 12.5 mL/hr over 240 Minutes Intravenous Every 8 hours 09/28/2020 1322 10/08/20 0952   10/11/2020 1430  vancomycin (VANCOREADY) IVPB 1750 mg/350 mL  Status:  Discontinued  1,750 mg 175 mL/hr over 120 Minutes Intravenous Every 24 hours 09/13/2020 1349 10/06/2020 1137   09/26/20 1130  vancomycin (VANCOREADY) IVPB 2000 mg/400 mL        2,000 mg 200 mL/hr over 120 Minutes Intravenous  Once 09/26/20 1031 09/26/20 1411   09/26/20 1030  vancomycin variable dose per unstable renal function (pharmacist dosing)  Status:  Discontinued         Does not apply See admin instructions 09/26/20 1031 09/28/20 0816   09/24/2020 2200  metroNIDAZOLE (FLAGYL) IVPB 500 mg  Status:  Discontinued        500 mg 100 mL/hr over 60 Minutes Intravenous Every 8 hours 09/11/2020 1820 10/08/2020 1322   09/12/2020 2000  cefTRIAXone (ROCEPHIN) 2 g in sodium chloride 0.9 % 100 mL IVPB  Status:  Discontinued        2 g 200 mL/hr over 30 Minutes Intravenous Every 24 hours 09/22/2020 1820 10/09/2020 1322   09/19/2020 1330  vancomycin (VANCOCIN) 2,250 mg in sodium chloride 0.9 % 500 mL  IVPB  Status:  Discontinued        2,250 mg 250 mL/hr over 120 Minutes Intravenous Every 48 hours 10/04/2020 1228 09/19/2020 1320   10/03/2020 1300  cefTRIAXone (ROCEPHIN) 2 g in sodium chloride 0.9 % 100 mL IVPB  Status:  Discontinued        2 g 200 mL/hr over 30 Minutes Intravenous Every 24 hours 09/17/2020 1208 10/08/2020 1320   09/19/2020 1300  metroNIDAZOLE (FLAGYL) IVPB 500 mg  Status:  Discontinued        500 mg 100 mL/hr over 60 Minutes Intravenous Every 8 hours 09/16/2020 1208 09/26/2020 1320   10/02/2020 0915  metroNIDAZOLE (FLAGYL) IVPB 500 mg  Status:  Discontinued        500 mg 100 mL/hr over 60 Minutes Intravenous To Surgery 09/22/2020 0903 09/13/2020 0951   24-Sep-2020 0630  ceFAZolin (ANCEF) IVPB 2g/100 mL premix       "And" Linked Group Details   2 g 200 mL/hr over 30 Minutes Intravenous On call to O.R. 09-24-20 [id number] 09-24-20 1215   09-24-20 0630  metroNIDAZOLE (FLAGYL) IVPB 500 mg       "And" Linked Group Details   500 mg 100 mL/hr over 60 Minutes Intravenous On call to O.R. 2020-09-24 [id number] 09-24-20 0820      Consults: Treatment Team:  [name], MD Madelon Lips, MD [facility], [place], MD   Chief Complaint/Subjective:    Overnight Issues: Weaned off pressors  Objective:  Vital signs for last 24 hours: Temp:  [98.2 F (36.8 C)-98.7 F (37.1 C)] 98.2 F (36.8 C) (04/23 0400) Pulse Rate:  [68-89] 78 (04/23 0800) Resp:  [17-24] 19 (04/23 0800) BP: (96-123)/(44-71) 109/65 (04/23 0800) SpO2:  [92 %-98 %] 93 % (04/23 0800) FiO2 (%):  [21 %] 21 % (04/23 0348)  Hemodynamic parameters for last 24 hours:    Intake/Output from previous day: 04/22 0701 - 04/23 0700 In: 3845.5 [I.V.:3745.6; IV Piggyback:100] Out: 1802 [Urine:332; Drains:1470]  Intake/Output this shift: Total I/O In: 208.3 [I.V.:208.3] Out: -   Vent settings for last 24 hours: FiO2 (%):  [21 %] 21 %  Physical Exam:  Gen: NAD HEENT: trach in place on collar Resp: nonlabored Cardiovascular:  RRR Abdomen: soft, drain in place, vac in place Ext: no edema Neuro: mouthing words appropriate, alert   Assessment/Plan:   50 yo male 1 month s/p Whipple, right colectomy and SMV repair for T3N0 neuroendocrine tumor. C/b postop hemorrhagic shock  with DIC and subsequent diffuse small bowel necrosis, now s/p small bowel resection with external drainage of bile duct, pancreatic duct and stomach. -interrmittent HD -vac change MWF -continue PT/OT -g tube to gravity -TPN -amio drip -vte: SQH -dispo: ICU   LOS: 40 days   [name] 11/01/2020  *Care during the described time interval was provided by me and/or other providers on the critical care team.  I have reviewed this patient's available data, including medical history, events of note, physical examination and test results as part of my evaluation.

## 2020-11-01 NOTE — Progress Notes (Signed)
[facility] ROUNDING NOTE   Subjective:   Interval History: 50 year old gentleman with 1 month history of Whipple procedure right colectomy and SMV repair for a T3N0 neuroendocrine tumor.  He has been receiving dialysis on a Tuesday Thursday Saturday schedule.  It appears his last dialysis treatments 10/30/2020.  This was complicated with atrial fibrillation with rapid ventricular rate.  Only 1 L fluid was removed.  He was also transfused 1 unit packed red blood cells 10/30/2020.  Remains in positive fluid balance with very little urine output.  Blood pressure 107/67 pulse 83 temperature 98.2 O2 sats 95% 21% trach collar.  Urine output 315 cc 11/02/2018  IV amiodarone drip. IV TNA  Labs pending this morning.    Objective:  Vital signs in last 24 hours:  Temp:  [98.2 F (36.8 C)-98.7 F (37.1 C)] 98.2 F (36.8 C) (04/23 0400) Pulse Rate:  [68-89] 85 (04/23 0900) Resp:  [17-24] 20 (04/23 0900) BP: (96-123)/(44-71) 114/62 (04/23 0900) SpO2:  [92 %-98 %] 96 % (04/23 0900) FiO2 (%):  [21 %] 21 % (04/23 0348)  Weight change:  Filed Weights   10/29/20 1503 10/30/20 0500 10/31/20 0500  Weight: 89.4 kg 98.1 kg 97.3 kg    Intake/Output: I/O last 3 completed shifts: In: 12025.9 [I.V.:10746.6; Blood:1104.3; Other:25; IV Piggyback:149.9] Out: 2487 [Urine:457; Drains:2030]   Intake/Output this shift:  Total I/O In: 309 [I.V.:309] Out: 15 [Urine:15]  General: no acute distress Neuro: alert and oriented, no focal deficits HEENT: trach in place, site clean Resp: normal work of breathing on PSV CV: RRR Abdomen: soft, nondistended, wound vac in place on midline incision. RLQ ostomy site is clean and granulating. R JP drains with dark old blood with bilious tinge. Biliary drain with bile. LUQ pancreatic stent with clear colorless fluid, no blood. LLQ perc drain with old blood. G tube in place to gravity LUQ.   Basic Metabolic Panel: Recent Labs  Lab 10/27/20 0457  10/28/20 0455 10/29/20 0652 10/30/20 0508 10/30/20 1757 10/30/20 2005 10/31/20 0602 10/31/20 1145  NA 140 142 138 140 139 137 136  --   K 3.8 3.9 4.3 5.1 4.0 4.4 4.4  --   CL 99 101 101 105  --  103 110  --   CO2 28 25 26 23   --  19* 15*  --   GLUCOSE 156* 187* 134* 106*  --  83 152*  --   BUN 81* 127* 91* 136*  --  69* 64*  --   CREATININE 4.88* 6.76* 5.64* 7.74*  --  4.79* 4.48*  --   CALCIUM 8.7* 8.7* 8.6* 9.4  --  8.2* 7.0*  --   MG 2.0  --   --  2.1  --  1.5* 1.6* 1.9  PHOS 3.6 4.1 3.2 4.1  --   --  5.6*  --     Liver Function Tests: Recent Labs  Lab 10/26/20 1600 10/27/20 0457 10/28/20 0455 10/30/20 0508 10/31/20 0602  AST  --  112*  --  105*  --   ALT  --  125*  --  129*  --   ALKPHOS  --  82  --  93  --   BILITOT  --  3.6*  --  3.4*  --   PROT  --  7.4  --  7.9  --   ALBUMIN 1.3* 1.3* 1.3* 1.3* 1.2*   No results for input(s): LIPASE, AMYLASE in the last 168 hours. No results for input(s): AMMONIA in the  last 168 hours.  CBC: Recent Labs  Lab 10/27/20 0457 10/29/20 0652 10/30/20 0700 10/30/20 1301 10/30/20 1757 10/31/20 0300 10/31/20 1145 10/31/20 1911 11/01/20 0628 11/01/20 0900  WBC 11.6* 10.1 12.0* 19.8*  --   --   --   --  8.0  --   NEUTROABS 8.0*  --   --   --   --   --   --   --   --   --   HGB 8.1* 7.4* 7.1* 7.7*   < > 8.6* 7.9* 7.7* 7.1* 7.6*  HCT 25.8* 24.6* 23.5* 24.9*   < > 26.5* 24.8* 23.4* 25.2* 22.7*  MCV 87.2 89.8 88.7 87.4  --   --   --   --  100.8*  --   PLT 244 183 172 211  --   --   --   --  105*  --    < > = values in this interval not displayed.    Cardiac Enzymes: No results for input(s): CKTOTAL, CKMB, CKMBINDEX, TROPONINI in the last 168 hours.  BNP: Invalid input(s): POCBNP  CBG: Recent Labs  Lab 10/31/20 1622 10/31/20 1936 10/31/20 2319 11/01/20 0352 11/01/20 0748  GLUCAP 140* 116* 142* 142* 140*    Microbiology: Results for orders placed or performed during the hospital encounter of 09/18/2020  MRSA PCR  Screening     Status: None   Collection Time: 09/13/2020  9:32 PM   Specimen: Nasal Mucosa; Nasopharyngeal  Result Value Ref Range Status   MRSA by PCR NEGATIVE NEGATIVE Final    Comment:        The GeneXpert MRSA Assay (FDA approved for NASAL specimens only), is one component of a comprehensive MRSA colonization surveillance program. It is not intended to diagnose MRSA infection nor to guide or monitor treatment for MRSA infections. Performed at [facility], [place] [address] [place], [place] [address]   Culture, blood (Routine X 2) w Reflex to ID Panel     Status: None   Collection Time: 10/01/20  5:56 AM   Specimen: BLOOD LEFT HAND  Result Value Ref Range Status   Specimen Description BLOOD LEFT HAND  Final   Special Requests   Final    BOTTLES DRAWN AEROBIC ONLY Blood Culture results may not be optimal due to an inadequate volume of blood received in culture bottles   Culture   Final    NO GROWTH 5 DAYS Performed at [facility], [place] [address] [place], [place] [address]    Report Status 10/06/2020 FINAL  Final  Culture, blood (Routine X 2) w Reflex to ID Panel     Status: None   Collection Time: 10/01/20  6:05 AM   Specimen: BLOOD  Result Value Ref Range Status   Specimen Description BLOOD FINGER  Final   Special Requests   Final    BOTTLES DRAWN AEROBIC ONLY Blood Culture results may not be optimal due to an inadequate volume of blood received in culture bottles   Culture   Final    NO GROWTH 5 DAYS Performed at [facility], [place] [address] [place], [place] [address]    Report Status 10/06/2020 FINAL  Final  Culture, Respiratory w Gram Stain     Status: None   Collection Time: 10/14/20  9:32 AM   Specimen: Tracheal Aspirate; Respiratory  Result Value Ref Range Status   Specimen Description TRACHEAL ASPIRATE  Final   Special Requests Normal  Final  Gram Stain   Final    ABUNDANT WBC PRESENT, PREDOMINANTLY PMN ABUNDANT GRAM NEGATIVE  COCCOBACILLI Performed at [facility], [place] [address], [place], [place] [address]    Culture RARE KLEBSIELLA PNEUMONIAE  Final   Report Status 10/19/2020 FINAL  Final   Organism ID, Bacteria KLEBSIELLA PNEUMONIAE  Final      Susceptibility   Klebsiella pneumoniae - MIC*    AMPICILLIN >=32 RESISTANT Resistant     CEFAZOLIN <=4 SENSITIVE Sensitive     CEFEPIME <=0.12 SENSITIVE Sensitive     CEFTAZIDIME <=1 SENSITIVE Sensitive     CEFTRIAXONE <=0.25 SENSITIVE Sensitive     CIPROFLOXACIN <=0.25 SENSITIVE Sensitive     GENTAMICIN <=1 SENSITIVE Sensitive     IMIPENEM 0.5 SENSITIVE Sensitive     TRIMETH/SULFA <=20 SENSITIVE Sensitive     AMPICILLIN/SULBACTAM 8 SENSITIVE Sensitive     PIP/TAZO <=4 SENSITIVE Sensitive     * RARE KLEBSIELLA PNEUMONIAE  Culture, blood (Routine X 2) w Reflex to ID Panel     Status: None   Collection Time: 10/21/20  8:16 AM   Specimen: BLOOD  Result Value Ref Range Status   Specimen Description BLOOD LEFT ANTECUBITAL  Final   Special Requests   Final    BOTTLES DRAWN AEROBIC AND ANAEROBIC Blood Culture adequate volume   Culture   Final    NO GROWTH 5 DAYS Performed at [facility], [address] [place], [place] [address]    Report Status 10/26/2020 FINAL  Final  Culture, blood (Routine X 2) w Reflex to ID Panel     Status: None   Collection Time: 10/21/20  8:20 AM   Specimen: BLOOD LEFT FOREARM  Result Value Ref Range Status   Specimen Description BLOOD LEFT FOREARM  Final   Special Requests   Final    BOTTLES DRAWN AEROBIC AND ANAEROBIC Blood Culture results may not be optimal due to an inadequate volume of blood received in culture bottles   Culture   Final    NO GROWTH 5 DAYS Performed at [facility], [place] [address], [place], [place] [address]    Report Status 10/26/2020 FINAL  Final  Aerobic/Anaerobic Culture (surgical/deep wound)     Status: None   Collection Time: 10/21/20  4:45 PM   Specimen: Abscess  Result Value Ref  Range Status   Specimen Description ABSCESS  Final   Special Requests NONE  Final   Gram Stain   Final    RARE WBC PRESENT, PREDOMINANTLY PMN FEW GRAM VARIABLE ROD    Culture   Final    ABUNDANT KLEBSIELLA PNEUMONIAE NO ANAEROBES ISOLATED Performed at [facility], [place] [address], [place], [place] [address]    Report Status 10/28/2020 FINAL  Final   Organism ID, Bacteria KLEBSIELLA PNEUMONIAE  Final      Susceptibility   Klebsiella pneumoniae - MIC*    AMPICILLIN RESISTANT Resistant     CEFAZOLIN <=4 SENSITIVE Sensitive     CEFEPIME <=0.12 SENSITIVE Sensitive     CEFTAZIDIME <=1 SENSITIVE Sensitive     CEFTRIAXONE <=0.25 SENSITIVE Sensitive     CIPROFLOXACIN <=0.25 SENSITIVE Sensitive     GENTAMICIN <=1 SENSITIVE Sensitive     IMIPENEM <=0.25 SENSITIVE Sensitive     TRIMETH/SULFA <=20 SENSITIVE Sensitive     AMPICILLIN/SULBACTAM 4 SENSITIVE Sensitive     PIP/TAZO 8 SENSITIVE Sensitive     * ABUNDANT KLEBSIELLA PNEUMONIAE    Coagulation Studies: Recent Labs  10/30/20 1722  LABPROT 16.5*  INR 1.3*    Urinalysis: No results for input(s): COLORURINE, LABSPEC, PHURINE, GLUCOSEU, HGBUR, BILIRUBINUR, KETONESUR, PROTEINUR, UROBILINOGEN, NITRITE, LEUKOCYTESUR in the last 72 hours.  Invalid input(s): APPERANCEUR    Imaging: DG Chest 1 View  Result Date: 10/30/2020 CLINICAL DATA:  Shortness of breath. EXAM: CHEST  1 VIEW COMPARISON:  10/20/2020 FINDINGS: The right IJ catheter is stable. The right ventricular pacer wire/AICD is stable. The right PICC line is stable. The cardiac silhouette, mediastinal and hilar contours are normal. The lungs are clear. No pleural effusions, pulmonary edema or pneumothorax. IMPRESSION: No acute cardiopulmonary findings. Electronically Signed   By: Marijo Sanes M.D.   On: 10/30/2020 17:44   ECHOCARDIOGRAM LIMITED  Result Date: 10/31/2020    ECHOCARDIOGRAM LIMITED REPORT   Patient Name:   [name] Date of Exam: 10/31/2020 Medical  Rec #:  [id number]       Height:       74.0 in Accession #:    [id number]      Weight:       214.5 lb Date of Birth:  November 01, 1970      BSA:          2.239 m Patient Age:    25 years        BP:           115/64 mmHg Patient Gender: M               HR:           69 bpm. Exam Location:  Inpatient Procedure: 2D Echo, Limited Echo, Cardiac Doppler and Color Doppler Indications:    CHF  History:        Patient has prior history of Echocardiogram examinations, most                 recent 09/26/2020. CHF; Risk Factors:Hypertension.  Sonographer:    Cammy Brochure Referring Phys: [phone] [place]  1. Left ventricular ejection fraction, by estimation, is 50%. The left ventricle has mildly decreased function. The left ventricle demonstrates regional wall motion abnormalities with severe basal inferior and inferoseptal hypokinesis. The left ventricular internal cavity size was mildly dilated. Left ventricular diastolic parameters are consistent with Grade II diastolic dysfunction (pseudonormalization).  2. Right ventricular systolic function is normal. The right ventricular size is normal. Peak RV-RA gradient 31 mmHg.  3. Left atrial size was mildly dilated.  4. The mitral valve is normal in structure. Mild mitral valve regurgitation. No evidence of mitral stenosis.  5. The aortic valve is tricuspid. Aortic valve regurgitation is not visualized. Mild aortic valve sclerosis is present, with no evidence of aortic valve stenosis.  6. The IVC was not well-visualized. FINDINGS  Left Ventricle: Left ventricular ejection fraction, by estimation, is 50%. The left ventricle has mildly decreased function. The left ventricle demonstrates regional wall motion abnormalities. The left ventricular internal cavity size was mildly dilated. There is no left ventricular hypertrophy. Left ventricular diastolic parameters are consistent with Grade II diastolic dysfunction (pseudonormalization). Right Ventricle: The right  ventricular size is normal. No increase in right ventricular wall thickness. Right ventricular systolic function is normal. Left Atrium: Left atrial size was mildly dilated. Right Atrium: Right atrial size was normal in size. Mitral Valve: The mitral valve is normal in structure. There is mild calcification of the mitral valve leaflet(s). Mild mitral annular calcification. Mild mitral valve regurgitation. No evidence of mitral valve stenosis. MV peak gradient, 9.6  mmHg. The mean mitral valve gradient is 4.0 mmHg. Tricuspid Valve: The tricuspid valve is normal in structure. Tricuspid valve regurgitation is trivial. Aortic Valve: The aortic valve is tricuspid. Aortic valve regurgitation is not visualized. Mild aortic valve sclerosis is present, with no evidence of aortic valve stenosis. Aortic valve mean gradient measures 8.0 mmHg. Aortic valve peak gradient measures 13.5 mmHg. Aortic valve area, by VTI measures 1.89 cm. Pulmonic Valve: The pulmonic valve was normal in structure. Pulmonic valve regurgitation is not visualized. Aorta: The aortic root is normal in size and structure. Venous: The inferior vena cava was not well visualized. IAS/Shunts: No atrial level shunt detected by color flow Doppler. Additional Comments: A device lead is visualized in the right ventricle. LEFT VENTRICLE PLAX 2D LVIDd:         6.80 cm LVIDs:         5.35 cm LV PW:         1.10 cm LV IVS:        0.90 cm LVOT diam:     2.00 cm LV SV:         64 LV SV Index:   29 LVOT Area:     3.14 cm  LV Volumes (MOD) LV vol d, MOD A2C: 124.0 ml LV vol d, MOD A4C: 123.0 ml LV vol s, MOD A2C: 70.9 ml LV vol s, MOD A4C: 74.0 ml LV SV MOD A2C:     53.1 ml LV SV MOD A4C:     123.0 ml LV SV MOD BP:      54.4 ml RIGHT VENTRICLE RV Basal diam:  2.70 cm RV S prime:     13.40 cm/s TAPSE (M-mode): 4.0 cm LEFT ATRIUM             Index       RIGHT ATRIUM           Index LA diam:        4.50 cm 2.01 cm/m  RA Area:     15.50 cm LA Vol (A2C):   85.3 ml 38.10  ml/m RA Volume:   32.80 ml  14.65 ml/m LA Vol (A4C):   81.5 ml 36.40 ml/m LA Biplane Vol: 82.9 ml 37.02 ml/m  AORTIC VALVE                    PULMONIC VALVE AV Area (Vmax):    1.83 cm     PV Vmax:       1.38 m/s AV Area (Vmean):   1.75 cm     PV Peak grad:  7.6 mmHg AV Area (VTI):     1.89 cm AV Vmax:           184.00 cm/s AV Vmean:          131.000 cm/s AV VTI:            0.340 m AV Peak Grad:      13.5 mmHg AV Mean Grad:      8.0 mmHg LVOT Vmax:         107.00 cm/s LVOT Vmean:        72.900 cm/s LVOT VTI:          0.205 m LVOT/AV VTI ratio: 0.60  AORTA Ao Root diam: 2.70 cm MITRAL VALVE                TRICUSPID VALVE MV Area (PHT): 3.99 cm     TR Peak grad:   30.9 mmHg MV  Area VTI:   1.45 cm     TR Vmax:        278.00 cm/s MV Peak grad:  9.6 mmHg MV Mean grad:  4.0 mmHg     SHUNTS MV Vmax:       1.55 m/s     Systemic VTI:  0.20 m MV Vmean:      85.6 cm/s    Systemic Diam: 2.00 cm MV Decel Time: 190 msec MV E velocity: 140.00 cm/s MV A velocity: 49.90 cm/s MV E/A ratio:  2.81 Loralie Champagne MD Electronically signed by Loralie Champagne MD Signature Date/Time: 10/31/2020/4:32:06 PM    Final      Medications:   . sodium chloride 250 mL (10/23/20 0825)  . sodium chloride    . sodium chloride    . albumin human    . amiodarone Stopped (11/01/20 0854)  . norepinephrine (LEVOPHED) Adult infusion Stopped (10/31/20 1720)  . phenylephrine (NEO-SYNEPHRINE) Adult infusion Stopped (10/31/20 1828)  . promethazine (PHENERGAN) injection (IM or IVPB) Stopped (10/31/20 1549)  . TPN ADULT (ION) Stopped (11/01/20 0854)   . chlorhexidine  15 mL Mouth Rinse BID  . Chlorhexidine Gluconate Cloth  6 each Topical Q0600  . heparin injection (subcutaneous)  5,000 Units Subcutaneous Q8H  . insulin aspart  0-20 Units Subcutaneous Q4H  . insulin aspart  2 Units Subcutaneous Q4H  . lidocaine  5 mL Intradermal Once  . mouth rinse  15 mL Mouth Rinse q12n4p  . pantoprazole (PROTONIX) IV  40 mg Intravenous BID  . sodium  chloride flush  10-40 mL Intracatheter Q12H  . sodium chloride flush  5 mL Intracatheter Q8H   sodium chloride, sodium chloride, alteplase, fentaNYL (SUBLIMAZE) injection, heparin, ipratropium-albuterol, lidocaine (PF), lidocaine-prilocaine, ondansetron (ZOFRAN) IV, pentafluoroprop-tetrafluoroeth, polyvinyl alcohol, promethazine (PHENERGAN) injection (IM or IVPB), sodium chloride flush  Assessment/ Plan:  1.Acute renal failure -likely has ATN With hypotensive episodes requiring Levophed in the setting of a very complicated surgical history with multiple visits to the OR + DIC + diffuse small bowel necrosis. Creatinine fluctuating represents compromised renal function with unfortunately repeated insults. No evidence of obstruction. continued purulent drainage from JP drain in abdomen.   - Pt and family desire all aggressive interventions, confirmed 10/16/20 -startedCRRT 10/18/20-10/20/20  -Continue dialysis maintaining a TTS schedule at this time;  plan dialysis 11/01/2020.  Patient continues to have problems with dialysis.  We may wish to transition patient back to CRRT. - Will continue monitoring for any signs of renal recovery;  UOP present but still oliguric.  2. Respiratory failure reintubated now on the ventilator--> s/p trach in OR 10/29/2020  3. Neuroendocrine tumor s/p Whipple with unfortunate extensive small bowel necrosis s/p resection. Family is hopeful for a small bowel transplant, however this is quite a long ways off d/t multiple issues. CT 4/6 showing possible pelvic abscess, had been on Zosyn  4. Severe malnutrition on TPN.  5. Anemia- Hgb 9.7.  6. Hypokalemia: continue w/ repletion as needed    LOS: [place] @TODAY @9 :35 AM

## 2020-11-01 NOTE — Progress Notes (Signed)
Progress Note  Patient Name: [name] Date of Encounter: 11/01/2020  Primary Cardiologist: Cristopher Peru, MD  Subjective   Remains ill on trache collar. Denies any chest pain or SOB.  Tele shows NSR with PVCs  Inpatient Medications    Scheduled Meds: . chlorhexidine  15 mL Mouth Rinse BID  . Chlorhexidine Gluconate Cloth  6 each Topical Q0600  . heparin injection (subcutaneous)  5,000 Units Subcutaneous Q8H  . insulin aspart  0-20 Units Subcutaneous Q4H  . insulin aspart  2 Units Subcutaneous Q4H  . lidocaine  5 mL Intradermal Once  . mouth rinse  15 mL Mouth Rinse q12n4p  . pantoprazole (PROTONIX) IV  40 mg Intravenous BID  . sodium chloride flush  10-40 mL Intracatheter Q12H  . sodium chloride flush  5 mL Intracatheter Q8H   Continuous Infusions: . sodium chloride 250 mL (10/23/20 0825)  . sodium chloride    . sodium chloride    . albumin human    . amiodarone 30 mg/hr (11/01/20 0800)  . norepinephrine (LEVOPHED) Adult infusion Stopped (10/31/20 1720)  . phenylephrine (NEO-SYNEPHRINE) Adult infusion Stopped (10/31/20 1828)  . promethazine (PHENERGAN) injection (IM or IVPB) Stopped (10/31/20 1549)  . TPN ADULT (ION) 95 mL/hr at 11/01/20 0800   PRN Meds: sodium chloride, sodium chloride, alteplase, fentaNYL (SUBLIMAZE) injection, heparin, ipratropium-albuterol, lidocaine (PF), lidocaine-prilocaine, ondansetron (ZOFRAN) IV, pentafluoroprop-tetrafluoroeth, polyvinyl alcohol, promethazine (PHENERGAN) injection (IM or IVPB), sodium chloride flush   Vital Signs    Vitals:   11/01/20 0500 11/01/20 0600 11/01/20 0700 11/01/20 0800  BP: 106/61 104/63 107/60 109/65  Pulse: 80 79 84 78  Resp: 17 19 19 19   Temp:      TempSrc:      SpO2: 95% 94% 93% 93%  Weight:      Height:        Intake/Output Summary (Last 24 hours) at 11/01/2020 0906 Last data filed at 11/01/2020 0800 Gross per 24 hour  Intake 3435.34 ml  Output 1802 ml  Net 1633.34 ml   Last 3 Weights  10/31/2020 10/30/2020 10/29/2020  Weight (lbs) 214 lb 8.1 oz 216 lb 4.3 oz 197 lb 1.5 oz  Weight (kg) 97.3 kg 98.1 kg 89.4 kg     Telemetry    NSR with PVCs- Personally Reviewed  Physical Exam   GEN: ill appearing HEENT: Normal NECK: No JVD; No carotid bruits.  Trach collar in place LYMPHATICS: No lymphadenopathy CARDIAC:RRR, no murmurs, rubs, gallops RESPIRATORY:  Clear to auscultation without rales, wheezing or rhonchi  ABDOMEN: Soft, non-tender, non-distended MUSCULOSKELETAL:  No edema; No deformity  SKIN: Warm and dry NEUROLOGIC:  Alert and oriented x 3    Labs    High Sensitivity Troponin:  No results for input(s): TROPONINIHS in the last 720 hours.    Cardiac EnzymesNo results for input(s): TROPONINI in the last 168 hours. No results for input(s): TROPIPOC in the last 168 hours.   Chemistry Recent Labs  Lab 10/27/20 0457 10/28/20 0455 10/29/20 0652 10/30/20 0508 10/30/20 1757 10/30/20 2005 10/31/20 0602  NA 140 142   < > 140 139 137 136  K 3.8 3.9   < > 5.1 4.0 4.4 4.4  CL 99 101   < > 105  --  103 110  CO2 28 25   < > 23  --  19* 15*  GLUCOSE 156* 187*   < > 106*  --  83 152*  BUN 81* 127*   < > 136*  --  69* 64*  CREATININE 4.88* 6.76*   < > 7.74*  --  4.79* 4.48*  CALCIUM 8.7* 8.7*   < > 9.4  --  8.2* 7.0*  PROT 7.4  --   --  7.9  --   --   --   ALBUMIN 1.3* 1.3*  --  1.3*  --   --  1.2*  AST 112*  --   --  105*  --   --   --   ALT 125*  --   --  129*  --   --   --   ALKPHOS 82  --   --  93  --   --   --   BILITOT 3.6*  --   --  3.4*  --   --   --   GFRNONAA 14* 9*   < > 8*  --  14* 15*  ANIONGAP 13 16*   < > 12  --  15 11   < > = values in this interval not displayed.     Hematology Recent Labs  Lab 10/30/20 0700 10/30/20 1301 10/30/20 1757 10/31/20 1145 10/31/20 1911 11/01/20 0628  WBC 12.0* 19.8*  --   --   --  8.0  RBC 2.65* 2.85*  --   --   --  2.50*  HGB 7.1* 7.7*   < > 7.9* 7.7* 7.1*  HCT 23.5* 24.9*   < > 24.8* 23.4* 25.2*  MCV  88.7 87.4  --   --   --  100.8*  MCH 26.8 27.0  --   --   --  28.4  MCHC 30.2 30.9  --   --   --  28.2*  RDW 17.1* 16.8*  --   --   --  18.1*  PLT 172 211  --   --   --  105*   < > = values in this interval not displayed.    BNPNo results for input(s): BNP, PROBNP in the last 168 hours.   DDimer No results for input(s): DDIMER in the last 168 hours.   Radiology    DG Chest 1 View  Result Date: 10/30/2020 CLINICAL DATA:  Shortness of breath. EXAM: CHEST  1 VIEW COMPARISON:  10/20/2020 FINDINGS: The right IJ catheter is stable. The right ventricular pacer wire/AICD is stable. The right PICC line is stable. The cardiac silhouette, mediastinal and hilar contours are normal. The lungs are clear. No pleural effusions, pulmonary edema or pneumothorax. IMPRESSION: No acute cardiopulmonary findings. Electronically Signed   By: Marijo Sanes M.D.   On: 10/30/2020 17:44   ECHOCARDIOGRAM LIMITED  Result Date: 10/31/2020    ECHOCARDIOGRAM LIMITED REPORT   Patient Name:   [name] Date of Exam: 10/31/2020 Medical Rec #:  [id number]       Height:       74.0 in Accession #:    [id number]      Weight:       214.5 lb Date of Birth:  [birth year]      BSA:          2.239 m Patient Age:    50 years        BP:           115/64 mmHg Patient Gender: M               HR:           69 bpm. Exam Location:  Inpatient Procedure: 2D Echo, Limited  Echo, Cardiac Doppler and Color Doppler Indications:    CHF  History:        Patient has prior history of Echocardiogram examinations, most                 recent 09/26/2020. CHF; Risk Factors:Hypertension.  Sonographer:    Cammy Brochure Referring Phys: [phone] [place]  1. Left ventricular ejection fraction, by estimation, is 50%. The left ventricle has mildly decreased function. The left ventricle demonstrates regional wall motion abnormalities with severe basal inferior and inferoseptal hypokinesis. The left ventricular internal cavity size was mildly  dilated. Left ventricular diastolic parameters are consistent with Grade II diastolic dysfunction (pseudonormalization).  2. Right ventricular systolic function is normal. The right ventricular size is normal. Peak RV-RA gradient 31 mmHg.  3. Left atrial size was mildly dilated.  4. The mitral valve is normal in structure. Mild mitral valve regurgitation. No evidence of mitral stenosis.  5. The aortic valve is tricuspid. Aortic valve regurgitation is not visualized. Mild aortic valve sclerosis is present, with no evidence of aortic valve stenosis.  6. The IVC was not well-visualized. FINDINGS  Left Ventricle: Left ventricular ejection fraction, by estimation, is 50%. The left ventricle has mildly decreased function. The left ventricle demonstrates regional wall motion abnormalities. The left ventricular internal cavity size was mildly dilated. There is no left ventricular hypertrophy. Left ventricular diastolic parameters are consistent with Grade II diastolic dysfunction (pseudonormalization). Right Ventricle: The right ventricular size is normal. No increase in right ventricular wall thickness. Right ventricular systolic function is normal. Left Atrium: Left atrial size was mildly dilated. Right Atrium: Right atrial size was normal in size. Mitral Valve: The mitral valve is normal in structure. There is mild calcification of the mitral valve leaflet(s). Mild mitral annular calcification. Mild mitral valve regurgitation. No evidence of mitral valve stenosis. MV peak gradient, 9.6 mmHg. The mean mitral valve gradient is 4.0 mmHg. Tricuspid Valve: The tricuspid valve is normal in structure. Tricuspid valve regurgitation is trivial. Aortic Valve: The aortic valve is tricuspid. Aortic valve regurgitation is not visualized. Mild aortic valve sclerosis is present, with no evidence of aortic valve stenosis. Aortic valve mean gradient measures 8.0 mmHg. Aortic valve peak gradient measures 13.5 mmHg. Aortic valve area, by  VTI measures 1.89 cm. Pulmonic Valve: The pulmonic valve was normal in structure. Pulmonic valve regurgitation is not visualized. Aorta: The aortic root is normal in size and structure. Venous: The inferior vena cava was not well visualized. IAS/Shunts: No atrial level shunt detected by color flow Doppler. Additional Comments: A device lead is visualized in the right ventricle. LEFT VENTRICLE PLAX 2D LVIDd:         6.80 cm LVIDs:         5.35 cm LV PW:         1.10 cm LV IVS:        0.90 cm LVOT diam:     2.00 cm LV SV:         64 LV SV Index:   29 LVOT Area:     3.14 cm  LV Volumes (MOD) LV vol d, MOD A2C: 124.0 ml LV vol d, MOD A4C: 123.0 ml LV vol s, MOD A2C: 70.9 ml LV vol s, MOD A4C: 74.0 ml LV SV MOD A2C:     53.1 ml LV SV MOD A4C:     123.0 ml LV SV MOD BP:      54.4 ml RIGHT VENTRICLE RV Basal  diam:  2.70 cm RV S prime:     13.40 cm/s TAPSE (M-mode): 4.0 cm LEFT ATRIUM             Index       RIGHT ATRIUM           Index LA diam:        4.50 cm 2.01 cm/m  RA Area:     15.50 cm LA Vol (A2C):   85.3 ml 38.10 ml/m RA Volume:   32.80 ml  14.65 ml/m LA Vol (A4C):   81.5 ml 36.40 ml/m LA Biplane Vol: 82.9 ml 37.02 ml/m  AORTIC VALVE                    PULMONIC VALVE AV Area (Vmax):    1.83 cm     PV Vmax:       1.38 m/s AV Area (Vmean):   1.75 cm     PV Peak grad:  7.6 mmHg AV Area (VTI):     1.89 cm AV Vmax:           184.00 cm/s AV Vmean:          131.000 cm/s AV VTI:            0.340 m AV Peak Grad:      13.5 mmHg AV Mean Grad:      8.0 mmHg LVOT Vmax:         107.00 cm/s LVOT Vmean:        72.900 cm/s LVOT VTI:          0.205 m LVOT/AV VTI ratio: 0.60  AORTA Ao Root diam: 2.70 cm MITRAL VALVE                TRICUSPID VALVE MV Area (PHT): 3.99 cm     TR Peak grad:   30.9 mmHg MV Area VTI:   1.45 cm     TR Vmax:        278.00 cm/s MV Peak grad:  9.6 mmHg MV Mean grad:  4.0 mmHg     SHUNTS MV Vmax:       1.55 m/s     Systemic VTI:  0.20 m MV Vmean:      85.6 cm/s    Systemic Diam: 2.00 cm MV Decel  Time: 190 msec MV E velocity: 140.00 cm/s MV A velocity: 49.90 cm/s MV E/A ratio:  2.81 Loralie Champagne MD Electronically signed by Loralie Champagne MD Signature Date/Time: 10/31/2020/4:32:06 PM    Final     Cardiac Studies   2d echo 09/26/20 1. Left ventricular ejection fraction, by estimation, is 40 to 45%. The  left ventricle has mildly decreased function. The left ventricle  demonstrates global hypokinesis. Left ventricular diastolic parameters are  consistent with Grade I diastolic  dysfunction (impaired relaxation).  2. Right ventricular systolic function is normal. The right ventricular  size is normal. Tricuspid regurgitation signal is inadequate for assessing  PA pressure.  3. The mitral valve is grossly normal. Trivial mitral valve  regurgitation.  4. The aortic valve is grossly normal. Aortic valve regurgitation is not  visualized. No aortic stenosis is present.  5. The inferior vena cava is normal in size with greater than 50%  respiratory variability, suggesting right atrial pressure of 3 mmHg.   Comparison(s): A prior study was performed on 05/25/20. Prior images  reviewed side by side. LV function has decreased slightly.   2D echo 10/31/2020 IMPRESSIONS  1. Left  ventricular ejection fraction, by estimation, is 50%. The left  ventricle has mildly decreased function. The left ventricle demonstrates  regional wall motion abnormalities with severe basal inferior and  inferoseptal hypokinesis. The left  ventricular internal cavity size was mildly dilated. Left ventricular  diastolic parameters are consistent with Grade II diastolic dysfunction  (pseudonormalization).  2. Right ventricular systolic function is normal. The right ventricular  size is normal. Peak RV-RA gradient 31 mmHg.  3. Left atrial size was mildly dilated.  4. The mitral valve is normal in structure. Mild mitral valve  regurgitation. No evidence of mitral stenosis.  5. The aortic valve is tricuspid.  Aortic valve regurgitation is not  visualized. Mild aortic valve sclerosis is present, with no evidence of  aortic valve stenosis.  6. The IVC was not well-visualized.  Patient Profile     50 y.o. male with longstanding NICM, chronic systolic CHF, VT/VF with prior cardiac arrest 2011 (nonobstructive CAD by cath at that time - 20-30% ramus otherwise small vessels) s/p STJ ICD, HTN, HLD, neuroendocrine tumor. Underwent Whipple procedure 3/14 with intra-operative SMV laceration and repair by vascular with bovine pericardial patch. Complex hospital course since that time with worsening hemorrhagic shock and anemia, massive transfusion protocol, small bowel necrosis s/p resection, septic shock, tracheostomy, AKI, DIC. On 10/31/19 developed hypotension, AF RVR and 2 inappropriate ICD shocks for rapid atrial ib.  Assessment & Plan    1. Rapid atrial fib - inappropriate ICDx2 shock for this - with hypotension on pressors not felt to be a candidate for BB at present time - continue IV amiodarone for now - per nurse, pharmacy is looking into whether or not perhaps it could be reconstituted in TPN - see EP note, appreciate their input - maintaining NSR on tele - hold off anticoagulation give anemia and inability to take oral agents - can revisit as clinical scenario evolves  2. NICM/chronic systolic CHF - repeat limited echo with low normal LVF with EF 50% and basal inferior and inferoseptal HK - remains on pressor support therefore not on GDMT at this time  3. History of VT/VF - on amiodarone prior to admission - held in context of severe illness and now issue with resumption of oral due to s/b resection - He will need ICD battery replacement in the future and EP can have him on the radar to consider the timing of this.  4. Neuroendocrine tumor s/p Whipple with complex hospital course - per primary teams - persistent hypomagnesemia/hypocalcemia noted - will defer electrolyte management to primary  teams - BMET pending this am  5.  Anemia - has gotten large load of blood product -very anemic this am despite tranfusions -Hbg has dropped to 7.1 this am (8.6 yesterday) -per primary team  I have spent a total of 30 minutes with patient reviewing 2D echo , telemetry, EKGs, labs and examining patient as well as establishing an assessment and plan that was discussed with the patient.  > 50% of time was spent in direct patient care.    For questions or updates, please contact [place] Please consult [email or web address] for contact info under Cardiology/STEMI.  Signed, Fransico Him, MD 11/01/2020, 9:06 AM

## 2020-11-02 DIAGNOSIS — R579 Shock, unspecified: Secondary | ICD-10-CM | POA: Diagnosis not present

## 2020-11-02 DIAGNOSIS — N179 Acute kidney failure, unspecified: Secondary | ICD-10-CM | POA: Diagnosis not present

## 2020-11-02 DIAGNOSIS — I480 Paroxysmal atrial fibrillation: Secondary | ICD-10-CM | POA: Diagnosis not present

## 2020-11-02 DIAGNOSIS — D3A8 Other benign neuroendocrine tumors: Secondary | ICD-10-CM | POA: Diagnosis not present

## 2020-11-02 LAB — RENAL FUNCTION PANEL
Albumin: 1.2 g/dL — ABNORMAL LOW (ref 3.5–5.0)
Anion gap: 9 (ref 5–15)
BUN: 61 mg/dL — ABNORMAL HIGH (ref 6–20)
CO2: 28 mmol/L (ref 22–32)
Calcium: 8.1 mg/dL — ABNORMAL LOW (ref 8.9–10.3)
Chloride: 101 mmol/L (ref 98–111)
Creatinine, Ser: 4.31 mg/dL — ABNORMAL HIGH (ref 0.61–1.24)
GFR, Estimated: 16 mL/min — ABNORMAL LOW (ref >60)
Glucose, Bld: 135 mg/dL — ABNORMAL HIGH (ref 70–99)
Phosphorus: 2.4 mg/dL — ABNORMAL LOW (ref 2.5–4.6)
Potassium: 3.2 mmol/L — ABNORMAL LOW (ref 3.5–5.1)
Sodium: 138 mmol/L (ref 135–145)

## 2020-11-02 LAB — GLUCOSE, CAPILLARY
Glucose-Capillary: 121 mg/dL — ABNORMAL HIGH (ref 70–99)
Glucose-Capillary: 122 mg/dL — ABNORMAL HIGH (ref 70–99)
Glucose-Capillary: 124 mg/dL — ABNORMAL HIGH (ref 70–99)
Glucose-Capillary: 129 mg/dL — ABNORMAL HIGH (ref 70–99)
Glucose-Capillary: 129 mg/dL — ABNORMAL HIGH (ref 70–99)
Glucose-Capillary: 136 mg/dL — ABNORMAL HIGH (ref 70–99)

## 2020-11-02 LAB — HEMOGLOBIN AND HEMATOCRIT, BLOOD
HCT: 23.8 % — ABNORMAL LOW (ref 39.0–52.0)
HCT: 24.4 % — ABNORMAL LOW (ref 39.0–52.0)
HCT: 25.6 % — ABNORMAL LOW (ref 39.0–52.0)
Hemoglobin: 7.8 g/dL — ABNORMAL LOW (ref 13.0–17.0)
Hemoglobin: 8 g/dL — ABNORMAL LOW (ref 13.0–17.0)
Hemoglobin: 7.3 g/dL — ABNORMAL LOW (ref 13.0–17.0)

## 2020-11-02 LAB — MAGNESIUM: Magnesium: 2.1 mg/dL (ref 1.7–2.4)

## 2020-11-02 MED ORDER — POTASSIUM PHOSPHATES 15 MMOLE/5ML IV SOLN
10.0000 mmol | Freq: Once | INTRAVENOUS | Status: AC
  Administered 2020-11-02: 10 mmol via INTRAVENOUS
  Filled 2020-11-02: qty 3.33

## 2020-11-02 MED ORDER — POTASSIUM CHLORIDE 10 MEQ/50ML IV SOLN
10.0000 meq | INTRAVENOUS | Status: AC
  Administered 2020-11-02 (×3): 10 meq via INTRAVENOUS
  Filled 2020-11-02 (×3): qty 50

## 2020-11-02 MED ORDER — ZINC CHLORIDE 1 MG/ML IV SOLN
INTRAVENOUS | Status: AC
  Filled 2020-11-02: qty 1246.4

## 2020-11-02 NOTE — Progress Notes (Addendum)
Progress Note  Patient Name: [name] Date of Encounter: 11/02/2020  Primary Cardiologist: Cristopher Peru, MD  Subjective   No chest pain or SOB  Inpatient Medications    Scheduled Meds: . chlorhexidine  15 mL Mouth Rinse BID  . Chlorhexidine Gluconate Cloth  6 each Topical Q0600  . heparin injection (subcutaneous)  5,000 Units Subcutaneous Q8H  . insulin aspart  0-20 Units Subcutaneous Q4H  . lidocaine  5 mL Intradermal Once  . mouth rinse  15 mL Mouth Rinse q12n4p  . pantoprazole (PROTONIX) IV  40 mg Intravenous BID  . sodium chloride flush  10-40 mL Intracatheter Q12H  . sodium chloride flush  5 mL Intracatheter Q8H   Continuous Infusions: . sodium chloride 250 mL (10/23/20 0825)  . sodium chloride    . sodium chloride    . albumin human    . amiodarone 30 mg/hr (11/02/20 0600)  . norepinephrine (LEVOPHED) Adult infusion Stopped (10/31/20 1720)  . phenylephrine (NEO-SYNEPHRINE) Adult infusion Stopped (10/31/20 1828)  . potassium chloride 10 mEq (11/02/20 0951)  . potassium PHOSPHATE IVPB (in mmol)    . promethazine (PHENERGAN) injection (IM or IVPB) Stopped (11/02/20 0313)  . TPN ADULT (ION) 95 mL/hr at 11/02/20 0600   PRN Meds: sodium chloride, sodium chloride, alteplase, fentaNYL (SUBLIMAZE) injection, heparin, ipratropium-albuterol, lidocaine (PF), lidocaine-prilocaine, ondansetron (ZOFRAN) IV, pentafluoroprop-tetrafluoroeth, polyvinyl alcohol, promethazine (PHENERGAN) injection (IM or IVPB), sodium chloride flush   Vital Signs    Vitals:   11/02/20 0500 11/02/20 0600 11/02/20 0800 11/02/20 0804  BP: 111/73 120/63  106/66  Pulse: (!) 106 (!) 102  (!) 113  Resp: 18 20  18   Temp:   98 F (36.7 C)   TempSrc:   Oral   SpO2: 95% 95%  96%  Weight:      Height:        Intake/Output Summary (Last 24 hours) at 11/02/2020 1006 Last data filed at 11/02/2020 0600 Gross per 24 hour  Intake 2597.92 ml  Output 3335 ml  Net -737.08 ml   Last 3 Weights  10/31/2020 10/30/2020 10/29/2020  Weight (lbs) 214 lb 8.1 oz 216 lb 4.3 oz 197 lb 1.5 oz  Weight (kg) 97.3 kg 98.1 kg 89.4 kg     Telemetry    NSR with PVCs and short bursts of PAF- Personally Reviewed  Physical Exam   GEN: ill appearing HEENT: Normal NECK: trach collar LYMPHATICS: No lymphadenopathy CARDIAC:RRR, no murmurs, rubs, gallops RESPIRATORY:  Clear to auscultation without rales, wheezing or rhonchi  ABDOMEN: Soft, non-tender, non-distended MUSCULOSKELETAL:  No edema; No deformity  SKIN: Warm and dry NEUROLOGIC:  Alert and oriented x 3 PSYCHIATRIC:  Normal affect     Labs    High Sensitivity Troponin:  No results for input(s): TROPONINIHS in the last 720 hours.    Cardiac EnzymesNo results for input(s): TROPONINI in the last 168 hours. No results for input(s): TROPIPOC in the last 168 hours.   Chemistry Recent Labs  Lab 10/27/20 0457 10/28/20 0455 10/30/20 0508 10/30/20 1757 10/31/20 0602 11/01/20 0900 11/02/20 0455  NA 140   < > 140   < > 136 137 138  K 3.8   < > 5.1   < > 4.4 3.7 3.2*  CL 99   < > 105   < > 110 104 101  CO2 28   < > 23   < > 15* 20* 28  GLUCOSE 156*   < > 106*   < > 152* 108*  135*  BUN 81*   < > 136*   < > 64* 110* 61*  CREATININE 4.88*   < > 7.74*   < > 4.48* 6.74* 4.31*  CALCIUM 8.7*   < > 9.4   < > 7.0* 8.0* 8.1*  PROT 7.4  --  7.9  --   --  6.1*  --   ALBUMIN 1.3*   < > 1.3*  --  1.2* 1.2* 1.2*  AST 112*  --  105*  --   --  116*  --   ALT 125*  --  129*  --   --  121*  --   ALKPHOS 82  --  93  --   --  87  --   BILITOT 3.6*  --  3.4*  --   --  2.8*  --   GFRNONAA 14*   < > 8*   < > 15* 9* 16*  ANIONGAP 13   < > 12   < > 11 13 9    < > = values in this interval not displayed.     Hematology Recent Labs  Lab 10/30/20 0700 10/30/20 1301 10/30/20 1757 11/01/20 0628 11/01/20 0900 11/01/20 1457 11/01/20 2236 11/02/20 0455  WBC 12.0* 19.8*  --  8.0  --   --   --   --   RBC 2.65* 2.85*  --  2.50*  --   --   --   --   HGB  7.1* 7.7*   < > 7.1*   < > 8.4* 8.1* 7.8*  HCT 23.5* 24.9*   < > 25.2*   < > 25.1* 24.8* 23.8*  MCV 88.7 87.4  --  100.8*  --   --   --   --   MCH 26.8 27.0  --  28.4  --   --   --   --   MCHC 30.2 30.9  --  28.2*  --   --   --   --   RDW 17.1* 16.8*  --  18.1*  --   --   --   --   PLT 172 211  --  105*  --   --   --   --    < > = values in this interval not displayed.    BNPNo results for input(s): BNP, PROBNP in the last 168 hours.   DDimer No results for input(s): DDIMER in the last 168 hours.   Radiology    No results found.  Cardiac Studies   2d echo 09/26/20 1. Left ventricular ejection fraction, by estimation, is 40 to 45%. The  left ventricle has mildly decreased function. The left ventricle  demonstrates global hypokinesis. Left ventricular diastolic parameters are  consistent with Grade I diastolic  dysfunction (impaired relaxation).  2. Right ventricular systolic function is normal. The right ventricular  size is normal. Tricuspid regurgitation signal is inadequate for assessing  PA pressure.  3. The mitral valve is grossly normal. Trivial mitral valve  regurgitation.  4. The aortic valve is grossly normal. Aortic valve regurgitation is not  visualized. No aortic stenosis is present.  5. The inferior vena cava is normal in size with greater than 50%  respiratory variability, suggesting right atrial pressure of 3 mmHg.   Comparison(s): A prior study was performed on 05/25/20. Prior images  reviewed side by side. LV function has decreased slightly.   2D echo 10/31/2020 IMPRESSIONS  1. Left ventricular ejection fraction, by estimation, is 50%.  The left  ventricle has mildly decreased function. The left ventricle demonstrates  regional wall motion abnormalities with severe basal inferior and  inferoseptal hypokinesis. The left  ventricular internal cavity size was mildly dilated. Left ventricular  diastolic parameters are consistent with Grade II diastolic  dysfunction  (pseudonormalization).  2. Right ventricular systolic function is normal. The right ventricular  size is normal. Peak RV-RA gradient 31 mmHg.  3. Left atrial size was mildly dilated.  4. The mitral valve is normal in structure. Mild mitral valve  regurgitation. No evidence of mitral stenosis.  5. The aortic valve is tricuspid. Aortic valve regurgitation is not  visualized. Mild aortic valve sclerosis is present, with no evidence of  aortic valve stenosis.  6. The IVC was not well-visualized.  Patient Profile     50 y.o. male with longstanding NICM, chronic systolic CHF, VT/VF with prior cardiac arrest 2011 (nonobstructive CAD by cath at that time - 20-30% ramus otherwise small vessels) s/p STJ ICD, HTN, HLD, neuroendocrine tumor. Underwent Whipple procedure 3/14 with intra-operative SMV laceration and repair by vascular with bovine pericardial patch. Complex hospital course since that time with worsening hemorrhagic shock and anemia, massive transfusion protocol, small bowel necrosis s/p resection, septic shock, tracheostomy, AKI, DIC. On 10/31/19 developed hypotension, AF RVR and 2 inappropriate ICD shocks for rapid atrial ib.  Assessment & Plan    1. Rapid atrial fib - inappropriate ICDx2 shock for this - with hypotension on pressors not felt to be a candidate for BB at present time - see EP note, appreciate their input - maintaining NSR on tele with intermittent runs of PAF with RVR - continue IV Amio for now and replete K+ to keep > 4 - hold off anticoagulation given anemia and inability to take oral agents - can revisit as clinical scenario evolves  2. NICM/chronic systolic CHF - repeat limited echo showed low normal LVF with EF 50% and basal inferior and inferoseptal HK - remains on pressor support therefore not on GDMT at this time  3. History of VT/VF - on amiodarone prior to admission - held in context of severe illness and now issue with resumption of oral  due to s/b resection - continue IV Amio for now - replete K+ to keep > 4 and Mag > 2 - He will need ICD battery replacement in the future and EP can have him on the radar to consider the timing of this.  4. Neuroendocrine tumor s/p Whipple with complex hospital course - per primary teams - persistent hypomagnesemia/hypocalcemia noted - will defer electrolyte management to primary teams - Mag ok at 2.4  - replete K+  5.  Anemia -has gotten large load of blood product -Hbg dropped yesterday to 7.1 and 7.8 this am -per primary team  I have spent a total of 30 minutes with patient reviewing 2D echo , telemetry, EKGs, labs and examining patient as well as establishing an assessment and plan that was discussed with the patient.  > 50% of time was spent in direct patient care.    For questions or updates, please contact [place] Please consult [email or web address] for contact info under Cardiology/STEMI.  Signed, Fransico Him, MD 11/02/2020, 10:06 AM

## 2020-11-02 NOTE — Progress Notes (Signed)
Follow up - Trauma and Critical Care  Patient Details:    HERSH MINNEY is an 50 y.o. male.  Anti-infectives:  Anti-infectives (From admission, onward)   Start     Dose/Rate Route Frequency Ordered Stop   10/26/20 1000  piperacillin-tazobactam (ZOSYN) IVPB 2.25 g  Status:  Discontinued        2.25 g 100 mL/hr over 30 Minutes Intravenous Every 12 hours 10/25/20 2233 10/26/20 0715   10/26/20 0815  piperacillin-tazobactam (ZOSYN) IVPB 2.25 g  Status:  Discontinued        2.25 g 100 mL/hr over 30 Minutes Intravenous Every 8 hours 10/26/20 0715 10/29/20 0647   10/22/20 0930  anidulafungin (ERAXIS) 100 mg in sodium chloride 0.9 % 100 mL IVPB  Status:  Discontinued       "Followed by" Linked Group Details   100 mg 78 mL/hr over 100 Minutes Intravenous Every 24 hours 10/21/20 0840 10/29/20 0647   10/21/20 2200  piperacillin-tazobactam (ZOSYN) IVPB 2.25 g  Status:  Discontinued        2.25 g 100 mL/hr over 30 Minutes Intravenous Every 8 hours 10/21/20 1410 10/25/20 2233   10/21/20 1400  piperacillin-tazobactam (ZOSYN) IVPB 2.25 g  Status:  Discontinued        2.25 g 100 mL/hr over 30 Minutes Intravenous Every 8 hours 10/21/20 0742 10/21/20 1410   10/21/20 0930  anidulafungin (ERAXIS) 200 mg in sodium chloride 0.9 % 200 mL IVPB       "Followed by" Linked Group Details   200 mg 78 mL/hr over 200 Minutes Intravenous  Once 10/21/20 0840 10/21/20 1411   10/19/20 1600  piperacillin-tazobactam (ZOSYN) IVPB 3.375 g  Status:  Discontinued        3.375 g 100 mL/hr over 30 Minutes Intravenous Every 6 hours 10/19/20 1048 10/21/20 0742   10/18/20 1600  piperacillin-tazobactam (ZOSYN) IVPB 3.375 g  Status:  Discontinued        3.375 g 100 mL/hr over 30 Minutes Intravenous Every 6 hours 10/18/20 1123 10/19/20 1048   10/15/20 0400  piperacillin-tazobactam (ZOSYN) IVPB 3.375 g  Status:  Discontinued        3.375 g 12.5 mL/hr over 240 Minutes Intravenous Every 8 hours 10/14/20 2107 10/18/20 1123    10/14/20 2200  piperacillin-tazobactam (ZOSYN) IVPB 3.375 g        3.375 g 100 mL/hr over 30 Minutes Intravenous  Once 10/14/20 2107 10/14/20 2238   10/01/20 1100  anidulafungin (ERAXIS) 100 mg in sodium chloride 0.9 % 100 mL IVPB  Status:  Discontinued       "Followed by" Linked Group Details   100 mg 78 mL/hr over 100 Minutes Intravenous Every 24 hours 09/30/20 1014 10/08/20 0951   09/30/20 1100  anidulafungin (ERAXIS) 200 mg in sodium chloride 0.9 % 200 mL IVPB       "Followed by" Linked Group Details   200 mg 78 mL/hr over 200 Minutes Intravenous  Once 09/30/20 1014 09/30/20 1516   09/13/2020 1600  vancomycin (VANCOREADY) IVPB 1000 mg/200 mL  Status:  Discontinued        1,000 mg 200 mL/hr over 60 Minutes Intravenous Every 24 hours 10/02/2020 1137 09/11/2020 1510   09/19/2020 1400  piperacillin-tazobactam (ZOSYN) IVPB 3.375 g  Status:  Discontinued        3.375 g 12.5 mL/hr over 240 Minutes Intravenous Every 8 hours 09/17/2020 1322 10/08/20 0952   2020-10-15 1430  vancomycin (VANCOREADY) IVPB 1750 mg/350 mL  Status:  Discontinued  1,750 mg 175 mL/hr over 120 Minutes Intravenous Every 24 hours 09/24/2020 1349 2020-10-13 1137   09/26/20 1130  vancomycin (VANCOREADY) IVPB 2000 mg/400 mL        2,000 mg 200 mL/hr over 120 Minutes Intravenous  Once 09/26/20 1031 09/26/20 1411   09/26/20 1030  vancomycin variable dose per unstable renal function (pharmacist dosing)  Status:  Discontinued         Does not apply See admin instructions 09/26/20 1031 09/28/20 0816   09/11/2020 2200  metroNIDAZOLE (FLAGYL) IVPB 500 mg  Status:  Discontinued        500 mg 100 mL/hr over 60 Minutes Intravenous Every 8 hours 09/28/2020 1820 10-13-2020 1322   10/09/2020 2000  cefTRIAXone (ROCEPHIN) 2 g in sodium chloride 0.9 % 100 mL IVPB  Status:  Discontinued        2 g 200 mL/hr over 30 Minutes Intravenous Every 24 hours 10/08/2020 1820 10/13/20 1322   09/27/2020 1330  vancomycin (VANCOCIN) 2,250 mg in sodium chloride 0.9 % 500 mL  IVPB  Status:  Discontinued        2,250 mg 250 mL/hr over 120 Minutes Intravenous Every 48 hours 09/16/2020 1228 09/18/2020 1320   10/05/2020 1300  cefTRIAXone (ROCEPHIN) 2 g in sodium chloride 0.9 % 100 mL IVPB  Status:  Discontinued        2 g 200 mL/hr over 30 Minutes Intravenous Every 24 hours 09/20/2020 1208 09/28/2020 1320   09/22/2020 1300  metroNIDAZOLE (FLAGYL) IVPB 500 mg  Status:  Discontinued        500 mg 100 mL/hr over 60 Minutes Intravenous Every 8 hours 09/14/2020 1208 09/26/2020 1320   09/27/2020 0915  metroNIDAZOLE (FLAGYL) IVPB 500 mg  Status:  Discontinued        500 mg 100 mL/hr over 60 Minutes Intravenous To Surgery 10/03/2020 0903 10/05/2020 0951   09/27/2020 0630  ceFAZolin (ANCEF) IVPB 2g/100 mL premix       "And" Linked Group Details   2 g 200 mL/hr over 30 Minutes Intravenous On call to O.R. 09/12/2020 6712 09/29/2020 1215   09/14/2020 0630  metroNIDAZOLE (FLAGYL) IVPB 500 mg       "And" Linked Group Details   500 mg 100 mL/hr over 60 Minutes Intravenous On call to O.R. 09/12/2020 4580 10/09/2020 0820      Consults: Treatment Team:  [name], MD Madelon Lips, MD [facility], [place], MD   Chief Complaint/Subjective:    Overnight Issues: Weaned off pressors. Denies complaints today.   Objective:  Vital signs for last 24 hours: Temp:  [98 F (36.7 C)-99.3 F (37.4 C)] 98 F (36.7 C) (04/24 0800) Pulse Rate:  [80-113] 112 (04/24 1129) Resp:  [15-25] 18 (04/24 1129) BP: (99-134)/(53-78) 99/54 (04/24 1129) SpO2:  [92 %-99 %] 94 % (04/24 1129) FiO2 (%):  [21 %] 21 % (04/24 1129)  Hemodynamic parameters for last 24 hours:    Intake/Output from previous day: 04/23 0701 - 04/24 0700 In: 3011.5 [I.V.:2961.5; IV Piggyback:50.1] Out: 3485 [Urine:240; Drains:1245]  Intake/Output this shift: Total I/O In: 527.5 [I.V.:439; IV Piggyback:88.5] Out: 300 [Drains:300]  Vent settings for last 24 hours: FiO2 (%):  [21 %] 21 %  Physical Exam:  Gen: NAD; on trach collar;  on amio gtt; no pressors HEENT: trach in place on collar Resp: nonlabored Cardiovascular: RRR - intermittent sinus tach to 100-115 Abdomen: soft, drain in place, vac in place Ext: no edema Neuro: mouthing words appropriate, alert   Assessment/Plan:  50 yo male 1 month s/p Whipple, right colectomy and SMV repair for T3N0 neuroendocrine tumor. C/b postop hemorrhagic shock with DIC and subsequent diffuse small bowel necrosis, now s/p small bowel resection with external drainage of bile duct, pancreatic duct and stomach.  -interrmittent HD - 2L removed 4/23; next 4/26 -vac change MWF -continue PT/OT -g tube to gravity -TPN -amio drip -vte: SQH -dispo: ICU   LOS: 41 days   *Care during the described time interval was provided by me and/or other providers on the critical care team.  I have reviewed this patient's available data, including medical history, events of note, physical examination and test results as part of my evaluation.  Nadeen Landau, MD [facility] Use [email or web address] to contact on call provider

## 2020-11-02 NOTE — Progress Notes (Signed)
PHARMACY - TOTAL PARENTERAL NUTRITION CONSULT NOTE  Indication:  Short bowel syndrome  Patient Measurements: Height: 6\' 2"  (188 cm) Weight: 124.9 kg (275 lb 5.7 oz) IBW/kg (Calculated) : 82.2 TPN AdjBW (KG): 95.3 Body mass index is 35.35 kg/m.  Assessment:  34 YOM with history of mass adjacent to head of the pancreas in 2021. 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  Developed cholecystitis in Feb 2022 and had percutaneous cholecystectomy tube placement.  Presented this admission for neuroendocrine tumor resection with Whipple procedure, also had right total colectomy, colostomy and cholecystectomy on 10/09/2020. SMV was lacerated during procedure and repaired by bovine pericardial patch. Developed hemorrhagic shock with bile leak post-op and underwent placement of biliary T tube with abdominal washout on 3/15 PM. Antimicrobial therapies discontinued 4/20.  Pharmacy consulted to manage TPN.  Glucose / Insulin: no hx DM, A1c 5.2%. CBGs controlled. Utilized 15 units SSI in the past 24h hours, 60 units in TPN (not given yesterday) CV: BP back to wnl - pressors weaned off Electrolytes: Na 138, K 3.2, CO2/Cl wnl, Phos 2.4, Mg 2.1, CoCa 10.3  Renal: CRRT started 4/9 > 4/11, now on iHD TTS (4K baths), last 4/23 (3.5 hr BFR 400), next planned 4/26 Hepatic: LFTs mildly elevated, Tbili down 2.8, alb 1.2, prealbumin 18.5 (4/18) << 22.8 (4/11) - TG peaked at 740 (3/26) - last value down to 227 (4/21) - intralipids 100g/wk and monitor TG trends.  Intake / Output; MIVF: UOP 0.1 ml/kg/hr, drains (5 total): 1145ml, 20 cc from gastrostomy, net +10.1L since admit     GI Imaging:  4/6 CT - pelvic fluid collection concerning for abscess 4/12 CT - no new fluid collections GI Surgeries / Procedures:  3/14 Whipple procedure with R hemicolectomy and end ileostomy, cholecystectomy 3/15 washout, VAC placement 3/17 washout, VAC replacement.  Necrosis of entire small bowel except for the  PB limb and proximal 40-50cm of jejunum 3/19 re-opening laparotomy, abd closure.  Necrotic SB. 3/21 SB resection, take down of choledochojejunal anastomosis and pancreaticojejunal anastomosis, take down of duodenojejunal anastomosis, placement of externalized biliary drain / Stamm gastrostomy tube, IA washout and placement of intraperitoneal drains 3/30 extubation 4/8 trach 4/12 left lateral abdominal drain placed by IR  Central access: R IJ CVC placed 3/15; changed to PICC placed 10/03/20 TPN start date: 10/04/2020  Nutritional Goals (RD rec on 4/21): 2800-3000 kCal, 185-210g AA, fluid >2L/day  Current Nutrition:  TPN  Plan:  - Continue concentrated TPN at 95 ml/hr - Intralipid 20% 220mL Mon/Thurs for now to prevent EFAD.  - TPN with Intralipid 2x/wk provides a daily average of 187g AA and 2869 kCal, meeting 100% of patient needs.  - Electrolytes in TPN: Na 194mEq/L, incr K to 10 mEq/L, cont with removed Ca, add back low dose Phos 2 mmol/L, incr Mg to 3 mEq/L, adjust to Cl:Ac 1:2 - KPhos 10 mmol x 1 dose today (will provide ~15 mEq KCl) - KCl 10 mEq IV runs x 3 - Daily multivitamin in TPN.  Remove standard trace elements and add back zinc 5mg , and selenium 65mcg.  Remove chromium while on RRT. - Continue resistant SSI Q4H,continue 60u insulin regular in TPN - Renal function daily - F/u TPN labs  Thank you for allowing pharmacy to be a part of this patient's care.  Alycia Rossetti, PharmD, BCPS Clinical Pharmacist Clinical phone for 11/02/2020: [phone] 11/02/2020 7:13 AM   **Pharmacist phone directory can now be found on [email or web address] ([email or web address]).  Listed under [place].

## 2020-11-02 NOTE — Progress Notes (Signed)
[facility] ROUNDING NOTE   Subjective:   Interval History: 50 year old gentleman with 1 month history of Whipple procedure right colectomy and SMV repair for a T3N0 neuroendocrine tumor.  He has been receiving dialysis on a Tuesday Thursday Saturday schedule.  It appears his last dialysis treatments 10/30/2020.  This was complicated with atrial fibrillation with rapid ventricular rate.  Only 1 L fluid was removed.  He was also transfused 1 unit packed red blood cells 10/30/2020.  Remains in positive fluid balance with very little urine output.  Blood pressure 106/66 pulse 109 temperature 98 urine output 465 cc 2 L removed with dialysis 11/01/2020 next treatment will be 11/04/2020  IV amiodarone drip. IV TNA  Sodium 138 potassium 3.2 chloride 101 CO2 28 BUN 1261 creatinine 4.31 glucose 135 calcium 8.1 phosphorus 2.4 albumin 1.2 hemoglobin 7.8    Objective:  Vital signs in last 24 hours:  Temp:  [98 F (36.7 C)-99.3 F (37.4 C)] 98 F (36.7 C) (04/24 0800) Pulse Rate:  [80-113] 113 (04/24 0804) Resp:  [15-25] 18 (04/24 0804) BP: (90-134)/(52-78) 106/66 (04/24 0804) SpO2:  [92 %-99 %] 96 % (04/24 0804) FiO2 (%):  [21 %] 21 % (04/24 0804)  Weight change:  Filed Weights   10/29/20 1503 10/30/20 0500 10/31/20 0500  Weight: 89.4 kg 98.1 kg 97.3 kg    Intake/Output: I/O last 3 completed shifts: In: 4617.4 [I.V.:4567.3; IV Piggyback:50.1] Out: [id number] [Urine:540; Drains:1880; Other:2000]   Intake/Output this shift:  No intake/output data recorded.  General: no acute distress Neuro: alert and oriented, no focal deficits HEENT: trach in place, site clean Resp: normal work of breathing on PSV CV: RRR Abdomen: soft, nondistended, wound vac in place on midline incision. RLQ ostomy site is clean and granulating. R JP drains with dark old blood with bilious tinge. Biliary drain with bile. LUQ pancreatic stent with clear colorless fluid, no blood. LLQ perc drain with old blood.  G tube in place to gravity LUQ.   Basic Metabolic Panel: Recent Labs  Lab 10/29/20 0652 10/30/20 0508 10/30/20 1757 10/30/20 2005 10/31/20 0602 10/31/20 1145 11/01/20 0900 11/02/20 0455  NA 138 140 139 137 136  --  137 138  K 4.3 5.1 4.0 4.4 4.4  --  3.7 3.2*  CL 101 105  --  103 110  --  104 101  CO2 26 23  --  19* 15*  --  20* 28  GLUCOSE 134* 106*  --  83 152*  --  108* 135*  BUN 91* 136*  --  69* 64*  --  110* 61*  CREATININE 5.64* 7.74*  --  4.79* 4.48*  --  6.74* 4.31*  CALCIUM 8.6* 9.4  --  8.2* 7.0*  --  8.0* 8.1*  MG  --  2.1  --  1.5* 1.6* 1.9 2.5*  --   PHOS 3.2 4.1  --   --  5.6*  --  5.7* 2.4*    Liver Function Tests: Recent Labs  Lab 10/27/20 0457 10/28/20 0455 10/30/20 0508 10/31/20 0602 11/01/20 0900 11/02/20 0455  AST 112*  --  105*  --  116*  --   ALT 125*  --  129*  --  121*  --   ALKPHOS 82  --  93  --  87  --   BILITOT 3.6*  --  3.4*  --  2.8*  --   PROT 7.4  --  7.9  --  6.1*  --   ALBUMIN 1.3* 1.3*  1.3* 1.2* 1.2* 1.2*   No results for input(s): LIPASE, AMYLASE in the last 168 hours. No results for input(s): AMMONIA in the last 168 hours.  CBC: Recent Labs  Lab 10/27/20 0457 10/29/20 0652 10/30/20 0700 10/30/20 1301 10/30/20 1757 11/01/20 0628 11/01/20 0900 11/01/20 1457 11/01/20 2236 11/02/20 0455  WBC 11.6* 10.1 12.0* 19.8*  --  8.0  --   --   --   --   NEUTROABS 8.0*  --   --   --   --   --   --   --   --   --   HGB 8.1* 7.4* 7.1* 7.7*   < > 7.1* 7.6* 8.4* 8.1* 7.8*  HCT 25.8* 24.6* 23.5* 24.9*   < > 25.2* 22.7* 25.1* 24.8* 23.8*  MCV 87.2 89.8 88.7 87.4  --  100.8*  --   --   --   --   PLT 244 183 172 211  --  105*  --   --   --   --    < > = values in this interval not displayed.    Cardiac Enzymes: No results for input(s): CKTOTAL, CKMB, CKMBINDEX, TROPONINI in the last 168 hours.  BNP: Invalid input(s): POCBNP  CBG: Recent Labs  Lab 11/01/20 1554 11/01/20 1942 11/01/20 2333 11/02/20 0337 11/02/20 0823   GLUCAP 118* 133* 125* 122* 136*    Microbiology: Results for orders placed or performed during the hospital encounter of 10/06/2020  MRSA PCR Screening     Status: None   Collection Time: 10/06/2020  9:32 PM   Specimen: Nasal Mucosa; Nasopharyngeal  Result Value Ref Range Status   MRSA by PCR NEGATIVE NEGATIVE Final    Comment:        The GeneXpert MRSA Assay (FDA approved for NASAL specimens only), is one component of a comprehensive MRSA colonization surveillance program. It is not intended to diagnose MRSA infection nor to guide or monitor treatment for MRSA infections. Performed at [facility], [place] [address] [place], [place] [address]   Culture, blood (Routine X 2) w Reflex to ID Panel     Status: None   Collection Time: 10/01/20  5:56 AM   Specimen: BLOOD LEFT HAND  Result Value Ref Range Status   Specimen Description BLOOD LEFT HAND  Final   Special Requests   Final    BOTTLES DRAWN AEROBIC ONLY Blood Culture results may not be optimal due to an inadequate volume of blood received in culture bottles   Culture   Final    NO GROWTH 5 DAYS Performed at [facility], [place] [address], [place], [place] [address]    Report Status 10/06/2020 FINAL  Final  Culture, blood (Routine X 2) w Reflex to ID Panel     Status: None   Collection Time: 10/01/20  6:05 AM   Specimen: BLOOD  Result Value Ref Range Status   Specimen Description BLOOD FINGER  Final   Special Requests   Final    BOTTLES DRAWN AEROBIC ONLY Blood Culture results may not be optimal due to an inadequate volume of blood received in culture bottles   Culture   Final    NO GROWTH 5 DAYS Performed at [facility], [place] [address], [place], [place] [address]    Report Status 10/06/2020 FINAL  Final  Culture, Respiratory w Gram Stain     Status: None   Collection Time: 10/14/20  9:32 AM   Specimen: Tracheal  Aspirate; Respiratory  Result Value Ref Range Status   Specimen Description TRACHEAL  ASPIRATE  Final   Special Requests Normal  Final   Gram Stain   Final    ABUNDANT WBC PRESENT, PREDOMINANTLY PMN ABUNDANT GRAM NEGATIVE COCCOBACILLI Performed at [facility], [address], [place], [place] [address]    Culture RARE KLEBSIELLA PNEUMONIAE  Final   Report Status 10/16/2020 FINAL  Final   Organism ID, Bacteria KLEBSIELLA PNEUMONIAE  Final      Susceptibility   Klebsiella pneumoniae - MIC*    AMPICILLIN >=32 RESISTANT Resistant     CEFAZOLIN <=4 SENSITIVE Sensitive     CEFEPIME <=0.12 SENSITIVE Sensitive     CEFTAZIDIME <=1 SENSITIVE Sensitive     CEFTRIAXONE <=0.25 SENSITIVE Sensitive     CIPROFLOXACIN <=0.25 SENSITIVE Sensitive     GENTAMICIN <=1 SENSITIVE Sensitive     IMIPENEM 0.5 SENSITIVE Sensitive     TRIMETH/SULFA <=20 SENSITIVE Sensitive     AMPICILLIN/SULBACTAM 8 SENSITIVE Sensitive     PIP/TAZO <=4 SENSITIVE Sensitive     * RARE KLEBSIELLA PNEUMONIAE  Culture, blood (Routine X 2) w Reflex to ID Panel     Status: None   Collection Time: 10/21/20  8:16 AM   Specimen: BLOOD  Result Value Ref Range Status   Specimen Description BLOOD LEFT ANTECUBITAL  Final   Special Requests   Final    BOTTLES DRAWN AEROBIC AND ANAEROBIC Blood Culture adequate volume   Culture   Final    NO GROWTH 5 DAYS Performed at [facility], [address] [place], [place] [address]    Report Status 10/26/2020 FINAL  Final  Culture, blood (Routine X 2) w Reflex to ID Panel     Status: None   Collection Time: 10/21/20  8:20 AM   Specimen: BLOOD LEFT FOREARM  Result Value Ref Range Status   Specimen Description BLOOD LEFT FOREARM  Final   Special Requests   Final    BOTTLES DRAWN AEROBIC AND ANAEROBIC Blood Culture results may not be optimal due to an inadequate volume of blood received in culture bottles   Culture   Final    NO GROWTH 5 DAYS Performed at [facility], [place] [address], [place], [place] [address]    Report Status 10/26/2020 FINAL  Final   Aerobic/Anaerobic Culture (surgical/deep wound)     Status: None   Collection Time: 10/21/20  4:45 PM   Specimen: Abscess  Result Value Ref Range Status   Specimen Description ABSCESS  Final   Special Requests NONE  Final   Gram Stain   Final    RARE WBC PRESENT, PREDOMINANTLY PMN FEW GRAM VARIABLE ROD    Culture   Final    ABUNDANT KLEBSIELLA PNEUMONIAE NO ANAEROBES ISOLATED Performed at [facility], [place] [address] [place], [place] [address]    Report Status 10/28/2020 FINAL  Final   Organism ID, Bacteria KLEBSIELLA PNEUMONIAE  Final      Susceptibility   Klebsiella pneumoniae - MIC*    AMPICILLIN RESISTANT Resistant     CEFAZOLIN <=4 SENSITIVE Sensitive     CEFEPIME <=0.12 SENSITIVE Sensitive     CEFTAZIDIME <=1 SENSITIVE Sensitive     CEFTRIAXONE <=0.25 SENSITIVE Sensitive     CIPROFLOXACIN <=0.25 SENSITIVE Sensitive     GENTAMICIN <=1 SENSITIVE Sensitive     IMIPENEM <=0.25 SENSITIVE Sensitive     TRIMETH/SULFA <=20 SENSITIVE Sensitive     AMPICILLIN/SULBACTAM 4 SENSITIVE  Sensitive     PIP/TAZO 8 SENSITIVE Sensitive     * ABUNDANT KLEBSIELLA PNEUMONIAE    Coagulation Studies: Recent Labs    10/30/20 1722  LABPROT 16.5*  INR 1.3*    Urinalysis: No results for input(s): COLORURINE, LABSPEC, PHURINE, GLUCOSEU, HGBUR, BILIRUBINUR, KETONESUR, PROTEINUR, UROBILINOGEN, NITRITE, LEUKOCYTESUR in the last 72 hours.  Invalid input(s): APPERANCEUR    Imaging: ECHOCARDIOGRAM LIMITED  Result Date: 10/31/2020    ECHOCARDIOGRAM LIMITED REPORT   Patient Name:   LYRIC HOAR Date of Exam: 10/31/2020 Medical Rec #:  [id number]       Height:       74.0 in Accession #:    [id number]      Weight:       214.5 lb Date of Birth:  10-Jul-1971      BSA:          2.239 m Patient Age:    29 years        BP:           115/64 mmHg Patient Gender: M               HR:           69 bpm. Exam Location:  Inpatient Procedure: 2D Echo, Limited Echo, Cardiac Doppler and Color Doppler  Indications:    CHF  History:        Patient has prior history of Echocardiogram examinations, most                 recent 09/26/2020. CHF; Risk Factors:Hypertension.  Sonographer:    Cammy Brochure Referring Phys: [phone] [place]  1. Left ventricular ejection fraction, by estimation, is 50%. The left ventricle has mildly decreased function. The left ventricle demonstrates regional wall motion abnormalities with severe basal inferior and inferoseptal hypokinesis. The left ventricular internal cavity size was mildly dilated. Left ventricular diastolic parameters are consistent with Grade II diastolic dysfunction (pseudonormalization).  2. Right ventricular systolic function is normal. The right ventricular size is normal. Peak RV-RA gradient 31 mmHg.  3. Left atrial size was mildly dilated.  4. The mitral valve is normal in structure. Mild mitral valve regurgitation. No evidence of mitral stenosis.  5. The aortic valve is tricuspid. Aortic valve regurgitation is not visualized. Mild aortic valve sclerosis is present, with no evidence of aortic valve stenosis.  6. The IVC was not well-visualized. FINDINGS  Left Ventricle: Left ventricular ejection fraction, by estimation, is 50%. The left ventricle has mildly decreased function. The left ventricle demonstrates regional wall motion abnormalities. The left ventricular internal cavity size was mildly dilated. There is no left ventricular hypertrophy. Left ventricular diastolic parameters are consistent with Grade II diastolic dysfunction (pseudonormalization). Right Ventricle: The right ventricular size is normal. No increase in right ventricular wall thickness. Right ventricular systolic function is normal. Left Atrium: Left atrial size was mildly dilated. Right Atrium: Right atrial size was normal in size. Mitral Valve: The mitral valve is normal in structure. There is mild calcification of the mitral valve leaflet(s). Mild mitral annular  calcification. Mild mitral valve regurgitation. No evidence of mitral valve stenosis. MV peak gradient, 9.6 mmHg. The mean mitral valve gradient is 4.0 mmHg. Tricuspid Valve: The tricuspid valve is normal in structure. Tricuspid valve regurgitation is trivial. Aortic Valve: The aortic valve is tricuspid. Aortic valve regurgitation is not visualized. Mild aortic valve sclerosis is present, with no evidence of aortic valve stenosis. Aortic valve mean gradient measures 8.0 mmHg.  Aortic valve peak gradient measures 13.5 mmHg. Aortic valve area, by VTI measures 1.89 cm. Pulmonic Valve: The pulmonic valve was normal in structure. Pulmonic valve regurgitation is not visualized. Aorta: The aortic root is normal in size and structure. Venous: The inferior vena cava was not well visualized. IAS/Shunts: No atrial level shunt detected by color flow Doppler. Additional Comments: A device lead is visualized in the right ventricle. LEFT VENTRICLE PLAX 2D LVIDd:         6.80 cm LVIDs:         5.35 cm LV PW:         1.10 cm LV IVS:        0.90 cm LVOT diam:     2.00 cm LV SV:         64 LV SV Index:   29 LVOT Area:     3.14 cm  LV Volumes (MOD) LV vol d, MOD A2C: 124.0 ml LV vol d, MOD A4C: 123.0 ml LV vol s, MOD A2C: 70.9 ml LV vol s, MOD A4C: 74.0 ml LV SV MOD A2C:     53.1 ml LV SV MOD A4C:     123.0 ml LV SV MOD BP:      54.4 ml RIGHT VENTRICLE RV Basal diam:  2.70 cm RV S prime:     13.40 cm/s TAPSE (M-mode): 4.0 cm LEFT ATRIUM             Index       RIGHT ATRIUM           Index LA diam:        4.50 cm 2.01 cm/m  RA Area:     15.50 cm LA Vol (A2C):   85.3 ml 38.10 ml/m RA Volume:   32.80 ml  14.65 ml/m LA Vol (A4C):   81.5 ml 36.40 ml/m LA Biplane Vol: 82.9 ml 37.02 ml/m  AORTIC VALVE                    PULMONIC VALVE AV Area (Vmax):    1.83 cm     PV Vmax:       1.38 m/s AV Area (Vmean):   1.75 cm     PV Peak grad:  7.6 mmHg AV Area (VTI):     1.89 cm AV Vmax:           184.00 cm/s AV Vmean:          131.000 cm/s  AV VTI:            0.340 m AV Peak Grad:      13.5 mmHg AV Mean Grad:      8.0 mmHg LVOT Vmax:         107.00 cm/s LVOT Vmean:        72.900 cm/s LVOT VTI:          0.205 m LVOT/AV VTI ratio: 0.60  AORTA Ao Root diam: 2.70 cm MITRAL VALVE                TRICUSPID VALVE MV Area (PHT): 3.99 cm     TR Peak grad:   30.9 mmHg MV Area VTI:   1.45 cm     TR Vmax:        278.00 cm/s MV Peak grad:  9.6 mmHg MV Mean grad:  4.0 mmHg     SHUNTS MV Vmax:       1.55 m/s     Systemic VTI:  0.20  m MV Vmean:      85.6 cm/s    Systemic Diam: 2.00 cm MV Decel Time: 190 msec MV E velocity: 140.00 cm/s MV A velocity: 49.90 cm/s MV E/A ratio:  2.81 Loralie Champagne MD Electronically signed by Loralie Champagne MD Signature Date/Time: 10/31/2020/4:32:06 PM    Final      Medications:   . sodium chloride 250 mL (10/23/20 0825)  . sodium chloride    . sodium chloride    . albumin human    . amiodarone 30 mg/hr (11/02/20 0600)  . norepinephrine (LEVOPHED) Adult infusion Stopped (10/31/20 1720)  . phenylephrine (NEO-SYNEPHRINE) Adult infusion Stopped (10/31/20 1828)  . potassium chloride 10 mEq (11/02/20 0837)  . potassium PHOSPHATE IVPB (in mmol)    . promethazine (PHENERGAN) injection (IM or IVPB) Stopped (11/02/20 0313)  . TPN ADULT (ION) 95 mL/hr at 11/02/20 0600   . chlorhexidine  15 mL Mouth Rinse BID  . Chlorhexidine Gluconate Cloth  6 each Topical Q0600  . heparin injection (subcutaneous)  5,000 Units Subcutaneous Q8H  . insulin aspart  0-20 Units Subcutaneous Q4H  . lidocaine  5 mL Intradermal Once  . mouth rinse  15 mL Mouth Rinse q12n4p  . pantoprazole (PROTONIX) IV  40 mg Intravenous BID  . sodium chloride flush  10-40 mL Intracatheter Q12H  . sodium chloride flush  5 mL Intracatheter Q8H   sodium chloride, sodium chloride, alteplase, fentaNYL (SUBLIMAZE) injection, heparin, ipratropium-albuterol, lidocaine (PF), lidocaine-prilocaine, ondansetron (ZOFRAN) IV, pentafluoroprop-tetrafluoroeth, polyvinyl alcohol,  promethazine (PHENERGAN) injection (IM or IVPB), sodium chloride flush  Assessment/ Plan:  1.Acute renal failure -likely has ATN With hypotensive episodes requiring Levophed in the setting of a very complicated surgical history with multiple visits to the OR + DIC + diffuse small bowel necrosis. Creatinine fluctuating represents compromised renal function with unfortunately repeated insults. No evidence of obstruction. continued purulent drainage from JP drain in abdomen.   - Pt and family desire all aggressive interventions, confirmed 10/16/20 -startedCRRT 10/18/20-10/20/20  -Continue dialysis maintaining a TTS schedule at this time;  underwent successful dialysis 11/01/2020 with 2 L removed - Will continue monitoring for any signs of renal recovery;  UOP present but still oliguric.  2. Respiratory failure reintubated now on the ventilator--> s/p trach in OR 10/27/2020  3. Neuroendocrine tumor s/p Whipple with unfortunate extensive small bowel necrosis s/p resection. Family is hopeful for a small bowel transplant, however this is quite a long ways off d/t multiple issues. CT 4/6 showing possible pelvic abscess, had been on Zosyn  4. Severe malnutrition on TPN.  5. Anemia-transfuse as necessary  6. Hypokalemia: continue w/ repletion as needed    LOS: [place] @TODAY @9 :14 AM

## 2020-11-03 DIAGNOSIS — Z95810 Presence of automatic (implantable) cardiac defibrillator: Secondary | ICD-10-CM | POA: Diagnosis not present

## 2020-11-03 DIAGNOSIS — J9601 Acute respiratory failure with hypoxia: Secondary | ICD-10-CM | POA: Diagnosis not present

## 2020-11-03 DIAGNOSIS — Z930 Tracheostomy status: Secondary | ICD-10-CM | POA: Diagnosis not present

## 2020-11-03 DIAGNOSIS — I480 Paroxysmal atrial fibrillation: Secondary | ICD-10-CM | POA: Diagnosis not present

## 2020-11-03 LAB — DIFFERENTIAL
Abs Immature Granulocytes: 0.23 10*3/uL — ABNORMAL HIGH (ref 0.00–0.07)
Basophils Absolute: 0 10*3/uL (ref 0.0–0.1)
Basophils Relative: 0 %
Eosinophils Absolute: 0.2 10*3/uL (ref 0.0–0.5)
Eosinophils Relative: 2 %
Immature Granulocytes: 3 %
Lymphocytes Relative: 16 %
Lymphs Abs: 1.3 10*3/uL (ref 0.7–4.0)
Monocytes Absolute: 0.6 10*3/uL (ref 0.1–1.0)
Monocytes Relative: 7 %
Neutro Abs: 5.9 10*3/uL (ref 1.7–7.7)
Neutrophils Relative %: 72 %

## 2020-11-03 LAB — HEMOGLOBIN AND HEMATOCRIT, BLOOD
HCT: 24.8 % — ABNORMAL LOW (ref 39.0–52.0)
HCT: 27 % — ABNORMAL LOW (ref 39.0–52.0)
HCT: 26.9 % — ABNORMAL LOW (ref 39.0–52.0)
Hemoglobin: 7.9 g/dL — ABNORMAL LOW (ref 13.0–17.0)
Hemoglobin: 8.7 g/dL — ABNORMAL LOW (ref 13.0–17.0)
Hemoglobin: 8.7 g/dL — ABNORMAL LOW (ref 13.0–17.0)

## 2020-11-03 LAB — CBC
HCT: 24.6 % — ABNORMAL LOW (ref 39.0–52.0)
Hemoglobin: 7.9 g/dL — ABNORMAL LOW (ref 13.0–17.0)
MCH: 27.5 pg (ref 26.0–34.0)
MCHC: 32.1 g/dL (ref 30.0–36.0)
MCV: 85.7 fL (ref 80.0–100.0)
Platelets: 143 10*3/uL — ABNORMAL LOW (ref 150–400)
RBC: 2.87 MIL/uL — ABNORMAL LOW (ref 4.22–5.81)
RDW: 18.1 % — ABNORMAL HIGH (ref 11.5–15.5)
WBC: 8.3 10*3/uL (ref 4.0–10.5)
nRBC: 0 % (ref 0.0–0.2)

## 2020-11-03 LAB — TRIGLYCERIDES: Triglycerides: 212 mg/dL — ABNORMAL HIGH (ref <150)

## 2020-11-03 LAB — PHOSPHORUS: Phosphorus: 2.3 mg/dL — ABNORMAL LOW (ref 2.5–4.6)

## 2020-11-03 LAB — COMPREHENSIVE METABOLIC PANEL
ALT: 126 U/L — ABNORMAL HIGH (ref 0–44)
AST: 103 U/L — ABNORMAL HIGH (ref 15–41)
Albumin: 1.2 g/dL — ABNORMAL LOW (ref 3.5–5.0)
Alkaline Phosphatase: 108 U/L (ref 38–126)
Anion gap: 12 (ref 5–15)
BUN: 94 mg/dL — ABNORMAL HIGH (ref 6–20)
CO2: 28 mmol/L (ref 22–32)
Calcium: 8.2 mg/dL — ABNORMAL LOW (ref 8.9–10.3)
Chloride: 99 mmol/L (ref 98–111)
Creatinine, Ser: 6.05 mg/dL — ABNORMAL HIGH (ref 0.61–1.24)
GFR, Estimated: 11 mL/min — ABNORMAL LOW (ref >60)
Glucose, Bld: 129 mg/dL — ABNORMAL HIGH (ref 70–99)
Potassium: 3 mmol/L — ABNORMAL LOW (ref 3.5–5.1)
Sodium: 139 mmol/L (ref 135–145)
Total Bilirubin: 2.7 mg/dL — ABNORMAL HIGH (ref 0.3–1.2)
Total Protein: 6.4 g/dL — ABNORMAL LOW (ref 6.5–8.1)

## 2020-11-03 LAB — PREALBUMIN: Prealbumin: 14.8 mg/dL — ABNORMAL LOW (ref 18–38)

## 2020-11-03 LAB — GLUCOSE, CAPILLARY
Glucose-Capillary: 117 mg/dL — ABNORMAL HIGH (ref 70–99)
Glucose-Capillary: 119 mg/dL — ABNORMAL HIGH (ref 70–99)
Glucose-Capillary: 120 mg/dL — ABNORMAL HIGH (ref 70–99)
Glucose-Capillary: 134 mg/dL — ABNORMAL HIGH (ref 70–99)
Glucose-Capillary: 137 mg/dL — ABNORMAL HIGH (ref 70–99)
Glucose-Capillary: 145 mg/dL — ABNORMAL HIGH (ref 70–99)

## 2020-11-03 LAB — MAGNESIUM: Magnesium: 2 mg/dL (ref 1.7–2.4)

## 2020-11-03 MED ORDER — POTASSIUM CHLORIDE 10 MEQ/50ML IV SOLN
10.0000 meq | INTRAVENOUS | Status: AC
  Administered 2020-11-03 (×4): 10 meq via INTRAVENOUS
  Filled 2020-11-03 (×4): qty 50

## 2020-11-03 MED ORDER — POTASSIUM PHOSPHATES 15 MMOLE/5ML IV SOLN
15.0000 mmol | Freq: Once | INTRAVENOUS | Status: AC
  Administered 2020-11-03: 15 mmol via INTRAVENOUS
  Filled 2020-11-03 (×2): qty 5

## 2020-11-03 MED ORDER — ZINC CHLORIDE 1 MG/ML IV SOLN
INTRAVENOUS | Status: AC
  Filled 2020-11-03: qty 1246.4

## 2020-11-03 MED ORDER — FAT EMULSION PLANT BASED 20% (INTRALIPID)IV EMUL
250.0000 mL | INTRAVENOUS | Status: AC
  Administered 2020-11-03: 250 mL via INTRAVENOUS
  Filled 2020-11-03 (×2): qty 250

## 2020-11-03 MED ORDER — CHLORHEXIDINE GLUCONATE CLOTH 2 % EX PADS
6.0000 | MEDICATED_PAD | Freq: Every day | CUTANEOUS | Status: DC
  Administered 2020-11-03: 6 via TOPICAL

## 2020-11-03 NOTE — Progress Notes (Signed)
Progress Note  Patient Name: [name] Date of Encounter: 11/03/2020  Primary Cardiologist: Cristopher Peru, MD  Subjective   No cardiac complaints will be NPO no enteral intake for long time   Inpatient Medications    Scheduled Meds: . chlorhexidine  15 mL Mouth Rinse BID  . Chlorhexidine Gluconate Cloth  6 each Topical Q0600  . Chlorhexidine Gluconate Cloth  6 each Topical Q0600  . heparin injection (subcutaneous)  5,000 Units Subcutaneous Q8H  . insulin aspart  0-20 Units Subcutaneous Q4H  . lidocaine  5 mL Intradermal Once  . mouth rinse  15 mL Mouth Rinse q12n4p  . pantoprazole (PROTONIX) IV  40 mg Intravenous BID  . sodium chloride flush  10-40 mL Intracatheter Q12H  . sodium chloride flush  5 mL Intracatheter Q8H   Continuous Infusions: . sodium chloride 250 mL (10/23/20 0825)  . sodium chloride    . sodium chloride    . albumin human    . amiodarone 30 mg/hr (11/03/20 0800)  . fat emul(INTRAlipid)    . potassium chloride 10 mEq (11/03/20 1004)  . potassium PHOSPHATE IVPB (in mmol) 15 mmol (11/03/20 1024)  . promethazine (PHENERGAN) injection (IM or IVPB) Stopped (11/03/20 0506)  . TPN ADULT (ION) 95 mL/hr at 11/03/20 0800  . TPN ADULT (ION)     PRN Meds: sodium chloride, sodium chloride, alteplase, fentaNYL (SUBLIMAZE) injection, heparin, ipratropium-albuterol, lidocaine (PF), lidocaine-prilocaine, ondansetron (ZOFRAN) IV, pentafluoroprop-tetrafluoroeth, polyvinyl alcohol, promethazine (PHENERGAN) injection (IM or IVPB), sodium chloride flush   Vital Signs    Vitals:   11/03/20 0600 11/03/20 0700 11/03/20 0800 11/03/20 0809  BP: 111/76 119/72 122/74   Pulse: 82 84 82 84  Resp: 14 16 17 14   Temp:   98.1 F (36.7 C)   TempSrc:   Oral   SpO2: 96% 96% 95% 95%  Weight:      Height:        Intake/Output Summary (Last 24 hours) at 11/03/2020 1041 Last data filed at 11/03/2020 0800 Gross per 24 hour  Intake 2678.58 ml  Output 1885 ml  Net 793.58 ml    Last 3 Weights 10/31/2020 10/30/2020 10/29/2020  Weight (lbs) 214 lb 8.1 oz 216 lb 4.3 oz 197 lb 1.5 oz  Weight (kg) 97.3 kg 98.1 kg 89.4 kg     Telemetry    NSR with PVCs and short bursts of PAF- Personally Reviewed  Physical Exam   Ill black male Trach collar Lungs clear anteriorly Post abdominal surgery  TPN line right neck Plus one LE edema AICD under left clavicle    Labs    High Sensitivity Troponin:  No results for input(s): TROPONINIHS in the last 720 hours.    Cardiac EnzymesNo results for input(s): TROPONINI in the last 168 hours. No results for input(s): TROPIPOC in the last 168 hours.   Chemistry Recent Labs  Lab 10/30/20 0508 10/30/20 1757 11/01/20 0900 11/02/20 0455 11/03/20 0519  NA 140   < > 137 138 139  K 5.1   < > 3.7 3.2* 3.0*  CL 105   < > 104 101 99  CO2 23   < > 20* 28 28  GLUCOSE 106*   < > 108* 135* 129*  BUN 136*   < > 110* 61* 94*  CREATININE 7.74*   < > 6.74* 4.31* 6.05*  CALCIUM 9.4   < > 8.0* 8.1* 8.2*  PROT 7.9  --  6.1*  --  6.4*  ALBUMIN 1.3*   < >  1.2* 1.2* 1.2*  AST 105*  --  116*  --  103*  ALT 129*  --  121*  --  126*  ALKPHOS 93  --  87  --  108  BILITOT 3.4*  --  2.8*  --  2.7*  GFRNONAA 8*   < > 9* 16* 11*  ANIONGAP 12   < > 13 9 12    < > = values in this interval not displayed.     Hematology Recent Labs  Lab 10/30/20 1301 10/30/20 1757 11/01/20 0628 11/01/20 0900 11/02/20 2233 11/03/20 0519 11/03/20 0838  WBC 19.8*  --  8.0  --   --  8.3  --   RBC 2.85*  --  2.50*  --   --  2.87*  --   HGB 7.7*   < > 7.1*   < > 7.3* 7.9* 7.9*  HCT 24.9*   < > 25.2*   < > 25.6* 24.6* 24.8*  MCV 87.4  --  100.8*  --   --  85.7  --   MCH 27.0  --  28.4  --   --  27.5  --   MCHC 30.9  --  28.2*  --   --  32.1  --   RDW 16.8*  --  18.1*  --   --  18.1*  --   PLT 211  --  105*  --   --  143*  --    < > = values in this interval not displayed.    BNPNo results for input(s): BNP, PROBNP in the last 168 hours.   DDimer No  results for input(s): DDIMER in the last 168 hours.   Radiology    No results found.  Cardiac Studies   2d echo 09/26/20 1. Left ventricular ejection fraction, by estimation, is 40 to 45%. The  left ventricle has mildly decreased function. The left ventricle  demonstrates global hypokinesis. Left ventricular diastolic parameters are  consistent with Grade I diastolic  dysfunction (impaired relaxation).  2. Right ventricular systolic function is normal. The right ventricular  size is normal. Tricuspid regurgitation signal is inadequate for assessing  PA pressure.  3. The mitral valve is grossly normal. Trivial mitral valve  regurgitation.  4. The aortic valve is grossly normal. Aortic valve regurgitation is not  visualized. No aortic stenosis is present.  5. The inferior vena cava is normal in size with greater than 50%  respiratory variability, suggesting right atrial pressure of 3 mmHg.   Comparison(s): A prior study was performed on 05/25/20. Prior images  reviewed side by side. LV function has decreased slightly.   2D echo 10/31/2020 IMPRESSIONS  1. Left ventricular ejection fraction, by estimation, is 50%. The left  ventricle has mildly decreased function. The left ventricle demonstrates  regional wall motion abnormalities with severe basal inferior and  inferoseptal hypokinesis. The left  ventricular internal cavity size was mildly dilated. Left ventricular  diastolic parameters are consistent with Grade II diastolic dysfunction  (pseudonormalization).  2. Right ventricular systolic function is normal. The right ventricular  size is normal. Peak RV-RA gradient 31 mmHg.  3. Left atrial size was mildly dilated.  4. The mitral valve is normal in structure. Mild mitral valve  regurgitation. No evidence of mitral stenosis.  5. The aortic valve is tricuspid. Aortic valve regurgitation is not  visualized. Mild aortic valve sclerosis is present, with no evidence of   aortic valve stenosis.  6. The IVC was not well-visualized.  Patient Profile     50 y.o. male with longstanding NICM, chronic systolic CHF, VT/VF with prior cardiac arrest 2011 (nonobstructive CAD by cath at that time - 20-30% ramus otherwise small vessels) s/p STJ ICD, HTN, HLD, neuroendocrine tumor. Underwent Whipple procedure 3/14 with intra-operative SMV laceration and repair by vascular with bovine pericardial patch. Complex hospital course since that time with worsening hemorrhagic shock and anemia, massive transfusion protocol, small bowel necrosis s/p resection, septic shock, tracheostomy, AKI, DIC. On 10/31/19 developed hypotension, AF RVR and 2 inappropriate ICD shocks for rapid atrial ib.  Assessment & Plan    1. Rapid atrial fib - inappropriate ICDx2 shock for this - NSR PAC this am  - continue iv amiodarone as inpatient  -? Transition to just q4 hour iv lopressor when d/c to long term care - DVT prophylaxis heparin for now   2. NICM/chronic systolic CHF - repeat limited echo showed low normal LVF with EF 50% and basal inferior and inferoseptal HK - PRN iv lasix to keep I/O's even other meds on hold   3. History of VT/VF - outpatient f/u EP for EOL and battery concerns  - No VT on amiodarone   4. Neuroendocrine tumor s/p Whipple with complex hospital course - per primary teams - persistent hypomagnesemia/hypocalcemia noted - will defer electrolyte management to primary teams - Mag ok at 2.4  - replete K+  5.  Anemia -has gotten large load of blood product -Hbg 7.9 this am  -per primary team  I have spent a total of 30 minutes with patient reviewing 2D echo , telemetry, EKGs, labs and examining patient as well as establishing an assessment and plan that was discussed with the patient.  > 50% of time was spent in direct patient care.    For questions or updates, please contact [place] Please consult [email or web address] for contact info under  Cardiology/STEMI.  Signed, Jenkins Rouge, MD 11/03/2020, 10:41 AM

## 2020-11-03 NOTE — Progress Notes (Signed)
PHARMACY - TOTAL PARENTERAL NUTRITION CONSULT NOTE  Indication:  Short bowel syndrome  Patient Measurements: Height: 6\' 2"  (188 cm) Weight: 124.9 kg (275 lb 5.7 oz) IBW/kg (Calculated) : 82.2 TPN AdjBW (KG): 95.3 Body mass index is 35.35 kg/m.  Assessment:  19 YOM with history of mass adjacent to head of the pancreas in 2021. 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  Developed cholecystitis in Feb 2022 and had percutaneous cholecystectomy tube placement.  Presented this admission for neuroendocrine tumor resection with Whipple procedure, also had right total colectomy, colostomy and cholecystectomy on October 05, 2020. SMV was lacerated during procedure and repaired by bovine pericardial patch. Developed hemorrhagic shock with bile leak post-op and underwent placement of biliary T tube with abdominal washout on 3/15 PM. Antimicrobial therapies discontinued 4/20. Pharmacy consulted to manage TPN.  Glucose / Insulin: no hx DM, A1c 5.2%. CBGs controlled. Utilized 18 units SSI in the past 24h hours, 60 units in TPN CV: BP back to wnl - pressors weaned off Electrolytes: K 3 (s/p K runs x 3 yesterday), Phos 2.3 (s/p KPhos 47mmol IV x 1 yesterday), CoCa ~10.5; others WNL Renal: CRRT started 4/9 > 4/11, now on iHD TTS (4K baths), last 4/23 (3.5 hr BFR 400), next planned 4/26 Hepatic: LFTs mildly elevated, Tbili down 2.7, alb 1.2, prealbumin 18.5 (4/18) << 22.8 (4/11) - TG peaked at 740 (3/26) - last value down to 212 (4/25) - intralipids 100g/wk and monitor TG trends.  Intake / Output; MIVF: UOP 0.2 ml/kg/hr, drains (5 total): 1779ml, net +13.2L since admit     GI Imaging: 4/6 CT - pelvic fluid collection concerning for abscess 4/12 CT - no new fluid collections GI Surgeries / Procedures: 3/14 Whipple procedure with R hemicolectomy and end ileostomy, cholecystectomy 3/15 washout, VAC placement 3/17 washout, VAC replacement.  Necrosis of entire small bowel except for the PB  limb and proximal 40-50cm of jejunum 3/19 re-opening laparotomy, abd closure.  Necrotic SB. 3/21 SB resection, take down of choledochojejunal anastomosis and pancreaticojejunal anastomosis, take down of duodenojejunal anastomosis, placement of externalized biliary drain / Stamm gastrostomy tube, IA washout and placement of intraperitoneal drains 3/30 extubation 4/8 trach 4/12 left lateral abdominal drain placed by IR  Central access: R IJ CVC placed 3/15; changed to PICC placed 10/03/20 TPN start date: 09/11/2020  Nutritional Goals (RD rec on 4/21): 2800-3000 kCal, 185-210g AA, fluid >2L/day  Current Nutrition:  TPN  Plan:  - Continue concentrated TPN at 95 ml/hr - Intralipid 20% 273mL Mon/Thurs for now to prevent EFAD.  - TPN with Intralipid 2x/wk provides a daily average of 187g AA and 2869 kCal, meeting 100% of patient needs.  - Electrolytes in TPN: Na 171mEq/L, incr K to 15 mEq/L, increase Phos to 4 mmol/L, continue with removed Ca, Mg 3 mEq/L, Cl:Ac 1:2 - KPhos 15 mmol x 1 dose today - KCl 10 mEq IV runs x 4 - Daily multivitamin in TPN. Remove standard trace elements and add back zinc 5mg  and selenium 67mcg. Remove chromium while on RRT. - Continue resistant SSI Q4H + 60 units insulin regular in TPN and adjust as needed - F/u TPN labs, Surgery plans   Arturo Morton, PharmD, BCPS Please check AMION for all [facility] contact numbers Clinical Pharmacist 11/03/2020 7:34 AM

## 2020-11-03 NOTE — Progress Notes (Signed)
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 s/p right hemicolectomy, end ileostomy, Whipple and patch angioplasty repair of SMV. postoperative hemorrhagic shock with DIC with extensive small bowel necrosis. All of small bowel has been resected, with external drainage of the bile duct, pancreas and stomach. Extubated 3/30. Reintubated 4/5. S/p tracheostomy 4/8. Started on CRRT 4/9. Started on trach collar trials 4/10.  Discontinued CRRT 4/11. PMH including AICD, heart failure, HTN, and dyslipidemia.    PT Comments    The pt made excellent progress with mobility this session as he was able to demo good LE movement and activation with a series of exercises in bed, as well as expressing desire to transfer to chair this session. The pt was able to complete sit-stand from EOB as well as lateral steps to recliner with min-modA of 2 to steady through HHA. The pt demos good power in BLE, and good clearance with lateral steps, but was noted to have increased drainage from R abdominal drain site with mobility. The RN was notified and reported drainage to be from removal of drain earlier in the day, but further mobility was deferred due to pt fatigue. Will continue to progress with OOB mobility and gait as able.     Follow Up Recommendations  CIR     Equipment Recommendations  Wheelchair (measurements PT);Wheelchair cushion (measurements PT);Hospital bed;Rolling walker with 5" wheels;3in1 (PT)    Recommendations for Other Services       Precautions / Restrictions Precautions Precautions: Fall Precaution Comments: Trach collar; x2 JP drains at abdomen and x2 biliary drains; wound vac Restrictions Weight Bearing Restrictions: No    Mobility  Bed Mobility Overal bed mobility: Needs Assistance Bed Mobility:  Rolling;Sidelying to Sit Rolling: Min guard Sidelying to sit: Min assist;+2 for safety/equipment       General bed mobility comments: minG for rolling and modA to transfer to sitting EOB, +2 to manage movement/lines/drains safely.    Transfers Overall transfer level: Needs assistance Equipment used: 2 person hand held assist Transfers: Sit to/from [facility] Sit to Stand: Min assist;+2 physical assistance Stand pivot transfers: Mod assist;+2 physical assistance       General transfer comment: pt with good power up from EOB with minA of 2 to steady and manage lines. modA to steady and support while taking side steps to recliner  Ambulation/Gait Ambulation/Gait assistance: Mod assist;+2 physical assistance Gait Distance (Feet): 5 Feet Assistive device: 2 person hand held assist Gait Pattern/deviations: Step-to pattern;Decreased step length - right;Decreased step length - left;Trunk flexed;Narrow base of support Gait velocity: decreased   General Gait Details: modA of 2 to steady with lateral steps. small steps with minimal clearance but no buckling of knees or change in vitals.      Balance Overall balance assessment: Needs assistance Sitting-balance support: No upper extremity supported;Feet supported Sitting balance-[name]: Fair Sitting balance - Comments: able to sit at EOB without support or UE Postural control: Posterior lean;Right lateral lean Standing balance support: Bilateral upper extremity supported;During functional activity Standing balance-[name]: Poor Standing balance comment: reliant on bilateral hand support                            Cognition Arousal/Alertness: Awake/alert Behavior During Therapy: WFL for tasks assessed/performed Overall Cognitive Status: Impaired/Different  from baseline Area of Impairment: Problem solving;Following commands                             Problem Solving: Requires verbal  cues General Comments: pt requiring some cues to initiate movements, able to speak but minimally conversant through session using gestures or head nods mostly.      Exercises General Exercises - Lower Extremity Ankle Circles/Pumps: PROM;Both;20 reps;Supine Quad Sets: AROM;Both;15 reps;Supine Heel Slides: AROM;Both;10 reps;Supine    General Comments General comments (skin integrity, edema, etc.): VSS on 5L 28% trach collar      Pertinent Vitals/Pain Pain Assessment: Faces (pt verbally declining pain, grimacing with some movements) Faces Pain Scale: Hurts a little bit Pain Location: abdomen, with hip flexion, Pain Descriptors / Indicators: Discomfort;Grimacing Pain Intervention(s): Monitored during session;Repositioned           PT Goals (current goals can now be found in the care plan section) Acute Rehab PT Goals Patient Stated Goal: Return to PLOF PT Goal Formulation: With patient Time For Goal Achievement: 11/10/20 Potential to Achieve Goals: Fair Progress towards PT goals: Progressing toward goals    Frequency    Min 3X/week      PT Plan Current plan remains appropriate       AM-PAC PT "6 Clicks" Mobility   Outcome Measure  Help needed turning from your back to your side while in a flat bed without using bedrails?: A Little Help needed moving from lying on your back to sitting on the side of a flat bed without using bedrails?: A Lot Help needed moving to and from a bed to a chair (including a wheelchair)?: A Lot Help needed standing up from a chair using your arms (e.g., wheelchair or bedside chair)?: A Lot Help needed to walk in hospital room?: Total Help needed climbing 3-5 steps with a railing? : Total 6 Click Score: 11    End of Session Equipment Utilized During Treatment: Gait belt;Oxygen Activity Tolerance: Patient tolerated treatment well Patient left: with call bell/phone within reach;in chair;with chair alarm set;with family/visitor present Nurse  Communication: Mobility status PT Visit Diagnosis: Muscle weakness (generalized) (M62.81);Difficulty in walking, not elsewhere classified (R26.2);Pain Pain - part of body:  (abdomen)     Time: 6759-1638 PT Time Calculation (min) (ACUTE ONLY): 24 min  Charges:  $Gait Training: 8-22 mins $Therapeutic Activity: 8-22 mins                     Karma Ganja, PT, DPT   Acute Rehabilitation Department Pager #: [phone]   Otho Bellows 11/03/2020, 4:09 PM

## 2020-11-03 NOTE — Progress Notes (Signed)
    17 Days Post-Op  Subjective: Afebrile. Patient remains off pressors. WBC normal. HR irregular this morning, rate in 70s. No acute complaints. UOP 550 yesterday.   Objective: Vital signs in last 24 hours: Temp:  [97.8 F (36.6 C)-98.7 F (37.1 C)] 98.4 F (36.9 C) (04/25 0400) Pulse Rate:  [75-112] 84 (04/25 0700) Resp:  [14-23] 16 (04/25 0700) BP: (99-120)/(54-78) 119/72 (04/25 0700) SpO2:  [93 %-97 %] 96 % (04/25 0700) FiO2 (%):  [21 %] 21 % (04/25 0342) Last BM Date:  (pta)  Intake/Output from previous day: 04/24 0701 - 04/25 0700 In: 2913.1 [I.V.:2543.3; IV Piggyback:369.8] Out: 2040 [Urine:550; Drains:1490] Intake/Output this shift: No intake/output data recorded.  PE: General: resting comfortably, NAD Neuro: alert and oriented  HEENT: trach in place, site is clean Resp: normal work of breathing, lungs CTAB CV: irregular rhythm, rate 70s Abdomen: soft, nondistended, wound vac in place midline incision. RUQ JP x2 with bile-tinged drainage. Biliary drain with bile. LUQ JP with scant old blood in drain. Pancreatic stent draining clear colorless fluid. Extremities: warm and well-perfused  Lab Results:  Recent Labs    11/01/20 0628 11/01/20 0900 11/02/20 2233 11/03/20 0519  WBC 8.0  --   --  8.3  HGB 7.1*   < > 7.3* 7.9*  HCT 25.2*   < > 25.6* 24.6*  PLT 105*  --   --  143*   < > = values in this interval not displayed.   BMET Recent Labs    11/02/20 0455 11/03/20 0519  NA 138 139  K 3.2* 3.0*  CL 101 99  CO2 28 28  GLUCOSE 135* 129*  BUN 61* 94*  CREATININE 4.31* 6.05*  CALCIUM 8.1* 8.2*   PT/INR No results for input(s): LABPROT, INR in the last 72 hours. CMP     Component Value Date/Time   NA 139 11/03/2020 0519   NA 142 07/21/2020 0921   K 3.0 (L) 11/03/2020 0519   CL 99 11/03/2020 0519   CO2 28 11/03/2020 0519   GLUCOSE 129 (H) 11/03/2020 0519   BUN 94 (H) 11/03/2020 0519   BUN 16 07/21/2020 0921   CREATININE 6.05 (H) 11/03/2020 0519    CREATININE 1.16 03/01/2016 1607   CALCIUM 8.2 (L) 11/03/2020 0519   PROT 6.4 (L) 11/03/2020 0519   PROT 6.7 07/21/2020 0921   ALBUMIN 1.2 (L) 11/03/2020 0519   ALBUMIN 4.1 07/21/2020 0921   AST 103 (H) 11/03/2020 0519   ALT 126 (H) 11/03/2020 0519   ALKPHOS 108 11/03/2020 0519   BILITOT 2.7 (H) 11/03/2020 0519   BILITOT 0.8 07/21/2020 0921   GFRNONAA 11 (L) 11/03/2020 0519   GFRNONAA >89 09/22/2015 1008   GFRAA 82 07/21/2020 0921   GFRAA >89 09/22/2015 1008   Lipase     Component Value Date/Time   LIPASE 23 08/22/2020 0318       Assessment/Plan - NPO, TPN, ice chips for comfort - Continue PT/OT to improve strength and mobility - Amio gtt. Will need to discuss long-term options with cardiology as patient cannot take enteral medications. - Patient remains afebrile with normal WBC. No signs of sepsis. Drains functioning. Will remove the RUQ JP that has not been draining. - VTE: SQH - Dispo: ICU    LOS: 42 days    Michaelle Birks, MD [facility] Surgery General, Hepatobiliary and Pancreatic Surgery 11/03/20 8:06 AM

## 2020-11-03 NOTE — Progress Notes (Addendum)
[facility] NEPHROLOGY PROGRESS NOTE  Assessment/ Plan: Pt is a 50 y.o. yo male with neuroendocrine tumor status post Whipple procedure right colectomy and SMV repair, developed AKI on dialysis.  #Acute kidney injury likely ischemic ATN with hypotensive episode requiring Levophed in the setting of very complicated surgical history and multiple visits to the OR, DIC, diffuse small bowel necrosis.  Creatinine level is fluctuating represents compromised renal function with repeated insults.  No evidence of recovery so far.  He required CRRT from 4/9-10/2009.  Since then undergoing intermittent hemodialysis TTS schedule.  Last HD on 4/23 with 2 L UF.  Plan for next HD tomorrow.  We will continue to monitor for renal recovery.  #Acute respiratory failure required mechanical ventilation. s/p trach on 4/8.  #Neuroendocrine tumor status post Whipple procedure with extensive small bowel necrosis status post resection.  Currently on TPN.  General surgery is following.  #Severe protein calorie malnutrition, hypoalbuminemia on TPN.  #Hypokalemia: Managed with dialysis and TPN.  Repeated potassium chloride.  #Anemia: Hemoglobin stable.  Transfuse PRBC as needed.  Subjective: Seen and examined.  Patient has trach, he is able to follow commands.  Urine output recorded 550 cc.  No new event. Objective Vital signs in last 24 hours: Vitals:   11/03/20 0600 11/03/20 0700 11/03/20 0800 11/03/20 0809  BP: 111/76 119/72 122/74   Pulse: 82 84 82 84  Resp: 14 16 17 14   Temp:   98.1 F (36.7 C)   TempSrc:   Oral   SpO2: 96% 96% 95% 95%  Weight:      Height:       Weight change:   Intake/Output Summary (Last 24 hours) at 11/03/2020 1022 Last data filed at 11/03/2020 0800 Gross per 24 hour  Intake 2678.58 ml  Output 1885 ml  Net 793.58 ml       Labs: Basic Metabolic Panel: Recent Labs  Lab 11/01/20 0900 11/02/20 0455 11/03/20 0519  NA 137 138 139  K 3.7 3.2* 3.0*  CL 104 101  99  CO2 20* 28 28  GLUCOSE 108* 135* 129*  BUN 110* 61* 94*  CREATININE 6.74* 4.31* 6.05*  CALCIUM 8.0* 8.1* 8.2*  PHOS 5.7* 2.4* 2.3*   Liver Function Tests: Recent Labs  Lab 10/30/20 0508 10/31/20 0602 11/01/20 0900 11/02/20 0455 11/03/20 0519  AST 105*  --  116*  --  103*  ALT 129*  --  121*  --  126*  ALKPHOS 93  --  87  --  108  BILITOT 3.4*  --  2.8*  --  2.7*  PROT 7.9  --  6.1*  --  6.4*  ALBUMIN 1.3*   < > 1.2* 1.2* 1.2*   < > = values in this interval not displayed.   No results for input(s): LIPASE, AMYLASE in the last 168 hours. No results for input(s): AMMONIA in the last 168 hours. CBC: Recent Labs  Lab 10/29/20 0652 10/30/20 0700 10/30/20 1301 10/30/20 1757 11/01/20 0628 11/01/20 0900 11/02/20 2233 11/03/20 0519 11/03/20 0838  WBC 10.1 12.0* 19.8*  --  8.0  --   --  8.3  --   NEUTROABS  --   --   --   --   --   --   --  5.9  --   HGB 7.4* 7.1* 7.7*   < > 7.1*   < > 7.3* 7.9* 7.9*  HCT 24.6* 23.5* 24.9*   < > 25.2*   < > 25.6* 24.6* 24.8*  MCV 89.8 88.7 87.4  --  100.8*  --   --  85.7  --   PLT 183 172 211  --  105*  --   --  143*  --    < > = values in this interval not displayed.   Cardiac Enzymes: No results for input(s): CKTOTAL, CKMB, CKMBINDEX, TROPONINI in the last 168 hours. CBG: Recent Labs  Lab 11/02/20 1558 11/02/20 2001 11/02/20 2349 11/03/20 0356 11/03/20 0752  GLUCAP 129* 121* 129* 134* 117*    Iron Studies: No results for input(s): IRON, TIBC, TRANSFERRIN, FERRITIN in the last 72 hours. Studies/Results: No results found.  Medications: Infusions: . sodium chloride 250 mL (10/23/20 0825)  . sodium chloride    . sodium chloride    . albumin human    . amiodarone 30 mg/hr (11/03/20 0800)  . fat emul(INTRAlipid)    . potassium chloride 10 mEq (11/03/20 1004)  . potassium PHOSPHATE IVPB (in mmol)    . promethazine (PHENERGAN) injection (IM or IVPB) Stopped (11/03/20 0506)  . TPN ADULT (ION) 95 mL/hr at 11/03/20 0800  .  TPN ADULT (ION)      Scheduled Medications: . chlorhexidine  15 mL Mouth Rinse BID  . Chlorhexidine Gluconate Cloth  6 each Topical Q0600  . heparin injection (subcutaneous)  5,000 Units Subcutaneous Q8H  . insulin aspart  0-20 Units Subcutaneous Q4H  . lidocaine  5 mL Intradermal Once  . mouth rinse  15 mL Mouth Rinse q12n4p  . pantoprazole (PROTONIX) IV  40 mg Intravenous BID  . sodium chloride flush  10-40 mL Intracatheter Q12H  . sodium chloride flush  5 mL Intracatheter Q8H    have reviewed scheduled and prn medications.  Physical Exam: General: Lying in bed, tracheostomy Heart:RRR, s1s2 nl Lungs: Clear anteriorly Abdomen:soft, non-distended Extremities:No edema Dialysis Access: IJ temporary catheter.  Sharmeka Palmisano Prasad Dondrell Loudermilk 11/03/2020,10:22 AM  LOS: 42 days

## 2020-11-03 NOTE — Progress Notes (Signed)
Inpatient Rehab Admissions Coordinator:   Following for my colleague, Clemens Catholic.  Note still requiring frequent doses of IV fentanyl for pain control.  Would need to have pain managed on PO meds to be able to tolerate CIR.  Will continue to follow.  Mickel Baas will return tomorrow.   Shann Medal, PT, DPT Admissions Coordinator [phone] 11/03/20  12:08 PM

## 2020-11-03 NOTE — Progress Notes (Signed)
[name], MRN:  [id number], DOB:  1970/09/24, LOS: 22 ADMISSION DATE:  09/13/2020, CONSULTATION DATE:  11/03/20 REFERRING MD:  Anesthesia, CHIEF COMPLAINT:  Whipple, post-op shock   Brief History:  50 y.o. M with PMH of neuroendocrine tumor and underwent Whipple procedure 3/14 with intra-operative SMV laceration and repair by vascular with bovine pericardial patch.  Pt had worsening shock overnight and required three pressors despite massive transfusion protocol (received 150 blood products).  He was taken back to the OR for washout on 3/15 and returned to the ICU intubated.  Taken back 3/17 again found to have small bowel necrosis s/p resection.  Plans again to return 3/19 to OR.   Totality of small bowel has been resected. The case was discussed with [facility] who may consider eval for sm intestinal transplant pending clinical course.  Continuing aggressive supportive care.   Past Medical History:   has a past medical history of AICD (automatic cardioverter/defibrillator) present, Dyslipidemia, Heart failure with mildly reduced EF, History of kidney stones, HTN (hypertension), ventricular fibrillation, and Nonischemic cardiomyopathy.  Significant Hospital Events:  3/14: Admit to Surgery, to OR for whipple, SMV injury and repair, massive transfusion protocol overnight  3/15: Back to OR for washout, x2. IR for arteriogram. No major bleed source identified in OR, or arterial bleed w IR. Robust product resuscitation >75 products (+ TXA + novoseven) 3/16: off pressors, add'l 39 products overnight. Slowing resuscitation this morning with hemodynamic improvements and slowing drain output; OR x 2 - washout, biliary t tube, wound vac -- washout, wound vac, IR for arteriogram -- no arterial bleed for embolization 3/17:  Aggressive balanced transfusion continue overnight with marked hemodynamic improvement since yesterday evening. Off pressors x several hours, Wound vac output significantly slowing  (from 511ml q15-82min to 520ml q1hr+) and output is much thinner, Thin bloody secretions from mouth approx 246ml, Decreased RR from 22 to 18, Unable to tolerate NE- severe bradycardia 3/19 taken back to OR. Small bowel noted to be almost completely necrosed. Patient was closed with plans for goals of car discussion.  3/20 GOC family endorses full scope of treatment. Primary team discussed with [facility] the possibility of transfer for small bowel transplant. [facility] felt as though transfer would not change outcome. 3/21 Ongoing discussion with family and [facility]. Patient may be a candidate for transplant if he can stabilize post a small bowel resection. He was taken back to the OR and small bowel was resected.  3/23 weaning pressors. Versed off. Remains encephalopathic 3/24 off pressors. Low dose dilaudid gtt -- only weakly grimaces to pain. Changing sedation to precedex + PRN fentanyl. Long discussion with 2 brothers regarding clinical case -- tried to clarify that while the term "stable" has been used, in this instance is meaning that he has not declined from previous shift but is in fact still critically ill with multisystem organ dysfunction.  3/25 Cr and BUN increased. Off sedation  3/26 Pressors off, CT head benign, BUN/Cr continue to creep up 3/27 Tachycardia/HTN overnight improved with fentanyl gtt 2/28 PCCM sign off. Trauma to take over vent/CCM needs.  3/30 extubated 4/2 desaturated overnight to the 30s improved with BVM and NTS.  4/5 poor airway protection. PCCM consulted for re-intubation.  4/6 CT a/p Large RLL opacity, smaller LLL opacity. Post op changes. 5.4cm fluid collection in posterior pelvis, concerning for abscess. Started on Zosyn  4/7 got transfused overnight and then diuresed. Worse renal function 4/9 transfused again. HD line inserted and started on  CRRT 4/10 started on trach collar trials. 4/12 off CRRT and transitioned to IHD. Surgery placed abdominal drain with minimal output--  during procedure seemed c/w hematoma however since grew out klebsiella  4/13, 4/14 Tolerated dialysis 4/15 Placed on vent overnight for rest 4/16 Tolerating trach collar 4/17 got HD. Trach dislodged overnight, replaced with #6 cuffless shiley.  4/18 afebrile, trach collar + PMV. Up to chair with PT  4/21 called back for hypotension, Afib/RVR, increased output from his 5 drains and wound vac.Transfused 2 units, re-cannulated, on Levo and Neo. 4/24 Continues to tolerate trach collar, 5L 28%   Consults:  PCCM VVS  IR  Palliative  Procedures:  PICC 3/25 > JP x 2  Biliary drain Pancreatic stent drain ETT 2/24>>3/13; 3/15 >> 3/29;  4/5>>4/8 Trach 4/8> L lateral abdominal drain 4/12>  Significant Diagnostic Tests:  See radiology tab Most recent significant imaging:  4/12 CT abd/pelvis>>  Unchanged ill-defined fluid collections within the left side of the root of the abdominal mesentery, the ventral aspect of the abdominal mesentery, the subcapsular aspect of the right lobe of the liver and the pelvic cul-de-sac all of which are incompletely evaluated on this noncontrast examination though may represent areas of evolving hematomas giving imaging stability for the past week. Redemonstrated ill-defined stranding surrounding the remaining portions of the pancreas without definable/drainable fluid collection on this noncontrast examination.     Micro Data:  See micro tab Most recent significant findings:   4/5 trach asp >> Klebsiella pneumoniae 4/12 Pelvic abscess >> ampicillin resistant Klebsiella pneumoniae 4/12 BCx> no growth   Antimicrobials:  Zosyn 3/21 > 3/29; 4/5 >4/19 Eraxis 3/23> 3/29, 4/12>4/19  Interim History / Subjective:   Improving over the weekend, in sinus rhythm on amiodarone, awake and tolerating trach collar. PCCM asked to manage respiratory issues only, rest of management to primary team  Objective   Blood pressure 111/76, pulse 82, temperature 98.4 F  (36.9 C), temperature source Oral, resp. rate 14, height 6\' 2"  (1.88 m), weight 97.3 kg, SpO2 96 %.    FiO2 (%):  [21 %] 21 %   Intake/Output Summary (Last 24 hours) at 11/03/2020 0750 Last data filed at 11/03/2020 0600 Gross per 24 hour  Intake 2913.1 ml  Output 2040 ml  Net 873.1 ml   Filed Weights   10/29/20 1503 10/30/20 0500 10/31/20 0500  Weight: 89.4 kg 98.1 kg 97.3 kg   Physical Exam:  General:  Resting, no distress, de-conditioned appearing HEENT: MM pink/moist, trach in place on ATC, sclera anicteric Neuro: opens eyes and nods to questions CV: s1s2 rrr, no m/r/g PULM:  Clear bilaterally without rhonchi or wheezing, on ATC 5L 28% with sats 94% GI: soft, bsx4 active  Extremities: warm/dry, no edema  Skin: no rashes or lesions   Resolved Hospital Problem list   Hemorrhagic shock hypophosphatemia Septic shock  Klebsiella pneumoniae pneumonia Acute hypoxemic respiratory failure requiring mechanical ventilation  Assessment & Plan:     Acute respiratory failure with hypoxia  S/p Tracheostomy -- decanulated -- re-inserting 4/21  Tolerating trach collar since 4/18  P: -continue trach collar, consider down-sizing pending dispo plan -aspiration PNA treated and improved -continue routine trach care protocol     Daily Goals Checklist  Per primary Code Status: full Code aggressive care       Otilio Carpen Billy Kotarski, PA-C [facility] See Amion for pager If no response to pager , please call [phone] until 7pm After 7:00 pm call Elink  [phone]    

## 2020-11-04 DIAGNOSIS — I480 Paroxysmal atrial fibrillation: Secondary | ICD-10-CM | POA: Diagnosis not present

## 2020-11-04 LAB — GLUCOSE, CAPILLARY
Glucose-Capillary: 121 mg/dL — ABNORMAL HIGH (ref 70–99)
Glucose-Capillary: 125 mg/dL — ABNORMAL HIGH (ref 70–99)
Glucose-Capillary: 138 mg/dL — ABNORMAL HIGH (ref 70–99)
Glucose-Capillary: 140 mg/dL — ABNORMAL HIGH (ref 70–99)
Glucose-Capillary: 144 mg/dL — ABNORMAL HIGH (ref 70–99)
Glucose-Capillary: 145 mg/dL — ABNORMAL HIGH (ref 70–99)

## 2020-11-04 LAB — CBC
HCT: 25.5 % — ABNORMAL LOW (ref 39.0–52.0)
Hemoglobin: 8.2 g/dL — ABNORMAL LOW (ref 13.0–17.0)
MCH: 27.4 pg (ref 26.0–34.0)
MCHC: 32.2 g/dL (ref 30.0–36.0)
MCV: 85.3 fL (ref 80.0–100.0)
Platelets: 161 10*3/uL (ref 150–400)
RBC: 2.99 MIL/uL — ABNORMAL LOW (ref 4.22–5.81)
RDW: 18.7 % — ABNORMAL HIGH (ref 11.5–15.5)
WBC: 9.5 10*3/uL (ref 4.0–10.5)
nRBC: 0 % (ref 0.0–0.2)

## 2020-11-04 LAB — RENAL FUNCTION PANEL
Albumin: 1.2 g/dL — ABNORMAL LOW (ref 3.5–5.0)
Albumin: 1.3 g/dL — ABNORMAL LOW (ref 3.5–5.0)
Anion gap: 14 (ref 5–15)
Anion gap: 16 — ABNORMAL HIGH (ref 5–15)
BUN: 127 mg/dL — ABNORMAL HIGH (ref 6–20)
BUN: 127 mg/dL — ABNORMAL HIGH (ref 6–20)
CO2: 25 mmol/L (ref 22–32)
CO2: 24 mmol/L (ref 22–32)
Calcium: 8.5 mg/dL — ABNORMAL LOW (ref 8.9–10.3)
Calcium: 8.3 mg/dL — ABNORMAL LOW (ref 8.9–10.3)
Chloride: 100 mmol/L (ref 98–111)
Chloride: 99 mmol/L (ref 98–111)
Creatinine, Ser: 6.94 mg/dL — ABNORMAL HIGH (ref 0.61–1.24)
Creatinine, Ser: 7.05 mg/dL — ABNORMAL HIGH (ref 0.61–1.24)
GFR, Estimated: 9 mL/min — ABNORMAL LOW (ref >60)
GFR, Estimated: 9 mL/min — ABNORMAL LOW (ref >60)
Glucose, Bld: 144 mg/dL — ABNORMAL HIGH (ref 70–99)
Glucose, Bld: 147 mg/dL — ABNORMAL HIGH (ref 70–99)
Phosphorus: 2.9 mg/dL (ref 2.5–4.6)
Phosphorus: 2.8 mg/dL (ref 2.5–4.6)
Potassium: 3.5 mmol/L (ref 3.5–5.1)
Potassium: 3.3 mmol/L — ABNORMAL LOW (ref 3.5–5.1)
Sodium: 139 mmol/L (ref 135–145)
Sodium: 139 mmol/L (ref 135–145)

## 2020-11-04 LAB — HEMOGLOBIN AND HEMATOCRIT, BLOOD
HCT: 25.6 % — ABNORMAL LOW (ref 39.0–52.0)
HCT: 25.2 % — ABNORMAL LOW (ref 39.0–52.0)
HCT: 22.6 % — ABNORMAL LOW (ref 39.0–52.0)
Hemoglobin: 8.4 g/dL — ABNORMAL LOW (ref 13.0–17.0)
Hemoglobin: 8.1 g/dL — ABNORMAL LOW (ref 13.0–17.0)
Hemoglobin: 5.8 g/dL — CL (ref 13.0–17.0)

## 2020-11-04 LAB — PREPARE RBC (CROSSMATCH)

## 2020-11-04 MED ORDER — ALTEPLASE 2 MG IJ SOLR
2.0000 mg | Freq: Once | INTRAMUSCULAR | Status: DC | PRN
  Filled 2020-11-04: qty 2

## 2020-11-04 MED ORDER — POTASSIUM PHOSPHATES 15 MMOLE/5ML IV SOLN
20.0000 mmol | Freq: Once | INTRAVENOUS | Status: AC
  Administered 2020-11-04: 20 mmol via INTRAVENOUS
  Filled 2020-11-04 (×2): qty 6.67

## 2020-11-04 MED ORDER — SODIUM CHLORIDE 0.9% IV SOLUTION: Freq: Once | INTRAVENOUS | Status: DC

## 2020-11-04 MED ORDER — PENTAFLUOROPROP-TETRAFLUOROETH EX AERO: 1.0000 "application " | INHALATION_SPRAY | CUTANEOUS | Status: DC | PRN

## 2020-11-04 MED ORDER — HEPARIN SODIUM (PORCINE) 1000 UNIT/ML DIALYSIS
1000.0000 [IU] | INTRAMUSCULAR | Status: DC | PRN
  Administered 2020-11-04: 1000 [IU] via INTRAVENOUS_CENTRAL
  Filled 2020-11-04: qty 1

## 2020-11-04 MED ORDER — FENTANYL 50 MCG/HR TD PT72
1.0000 | MEDICATED_PATCH | TRANSDERMAL | Status: DC
  Administered 2020-11-04: 1 via TRANSDERMAL

## 2020-11-04 MED ORDER — LIDOCAINE-PRILOCAINE 2.5-2.5 % EX CREA: 1.0000 "application " | TOPICAL_CREAM | CUTANEOUS | Status: DC | PRN

## 2020-11-04 MED ORDER — SODIUM CHLORIDE 0.9 % IV SOLN
100.0000 mL | INTRAVENOUS | Status: DC | PRN
  Administered 2020-11-07 – 2020-11-08 (×2): 100 mL via INTRAVENOUS

## 2020-11-04 MED ORDER — AMIODARONE LOAD VIA INFUSION
150.0000 mg | Freq: Once | INTRAVENOUS | Status: AC
  Administered 2020-11-04: 150 mg via INTRAVENOUS
  Filled 2020-11-04: qty 83.34

## 2020-11-04 MED ORDER — ZINC CHLORIDE 1 MG/ML IV SOLN
INTRAVENOUS | Status: AC
  Filled 2020-11-04: qty 1307.2

## 2020-11-04 MED ORDER — FENTANYL CITRATE (PF) 100 MCG/2ML IJ SOLN
50.0000 ug | INTRAMUSCULAR | Status: DC | PRN
  Administered 2020-11-04 (×2): 100 ug via INTRAVENOUS
  Administered 2020-11-04: 50 ug via INTRAVENOUS
  Administered 2020-11-04 (×3): 100 ug via INTRAVENOUS
  Administered 2020-11-05: 50 ug via INTRAVENOUS
  Administered 2020-11-05 (×2): 100 ug via INTRAVENOUS
  Administered 2020-11-05: 50 ug via INTRAVENOUS
  Administered 2020-11-05 (×5): 100 ug via INTRAVENOUS
  Administered 2020-11-06: 50 ug via INTRAVENOUS
  Administered 2020-11-06 – 2020-11-07 (×7): 100 ug via INTRAVENOUS
  Administered 2020-11-07 (×4): 50 ug via INTRAVENOUS
  Administered 2020-11-07 (×3): 100 ug via INTRAVENOUS
  Administered 2020-11-08: 50 ug via INTRAVENOUS
  Administered 2020-11-08 (×3): 100 ug via INTRAVENOUS
  Administered 2020-11-08 (×2): 50 ug via INTRAVENOUS
  Administered 2020-11-08 (×2): 100 ug via INTRAVENOUS
  Administered 2020-11-09: 50 ug via INTRAVENOUS
  Administered 2020-11-09 (×2): 100 ug via INTRAVENOUS
  Administered 2020-11-09 (×3): 50 ug via INTRAVENOUS
  Administered 2020-11-09 – 2020-11-10 (×5): 100 ug via INTRAVENOUS
  Administered 2020-11-10: 50 ug via INTRAVENOUS
  Administered 2020-11-11 – 2020-11-20 (×20): 100 ug via INTRAVENOUS
  Filled 2020-11-04 (×74): qty 2

## 2020-11-04 MED ORDER — HEPARIN SODIUM (PORCINE) 1000 UNIT/ML DIALYSIS: 20.0000 [IU]/kg | INTRAMUSCULAR | Status: DC | PRN

## 2020-11-04 MED ORDER — LIDOCAINE HCL (PF) 1 % IJ SOLN: 5.0000 mL | INTRAMUSCULAR | Status: DC | PRN

## 2020-11-04 MED ORDER — SODIUM CHLORIDE 0.9 % IV SOLN: 100.0000 mL | INTRAVENOUS | Status: DC | PRN

## 2020-11-04 NOTE — Progress Notes (Signed)
[facility] Progress Note    Assessment/ Plan:   Pt is a 50 y.o. yo male with neuroendocrine tumor status post Whipple procedure right colectomy and SMV repair, developed AKI on dialysis.  1.Acute kidney injury likely ischemic ATN with hypotensive episode requiring Levophed in the setting of very complicated surgical history and multiple visits to the OR, DIC, diffuse small bowel necrosis.  Creatinine level is fluctuating represents compromised renal function with repeated insults.  No evidence of recovery so far.  He required CRRT from 4/9-10/2009.  Since then undergoing intermittent hemodialysis TTS schedule.  HD today without ultrafiltration as patient intermittently hypotensive.  Urinary output 1.5 L yesterday.  We will continue to monitor for renal recovery.  2.Acute respiratory failure required mechanical ventilation. s/p trach on 4/8.  3.Neuroendocrine tumor status post Whipple procedure with extensive small bowel necrosis status post resection.  Currently on TPN.  General surgery is following.  4.Severe protein calorie malnutrition, hypoalbuminemia on TPN.  5.Hypokalemia: Managed with dialysis and TPN.  Repeat as needed.  6.Anemia: Hemoglobin stable.  Transfuse PRBC as needed.  Subjective:   HD today.  Patient denies cramping.   Objective:   BP 101/68   Pulse 88   Temp 98.6 F (37 C) (Oral)   Resp 18   Ht 6\' 2"  (1.88 m)   Wt 97.4 kg   SpO2 96%   BMI 27.57 kg/m   Intake/Output Summary (Last 24 hours) at 11/04/2020 3151 Last data filed at 11/04/2020 0800 Gross per 24 hour  Intake 3132.15 ml  Output 2585 ml  Net 547.15 ml   Weight change:   Physical Exam: Gen: Resting in bed, in no acute distress CVS: Regular rate and rhythm, no murmur, right IJ Resp: Clear to auscultation anteriorly Abd: Soft, nondistended Ext: Lower extremity edema  Imaging: No results found.  Labs: BMET Recent Labs  Lab 10/30/20 0508 10/30/20 1757 10/30/20 2005  10/31/20 0602 11/01/20 0900 11/02/20 0455 11/03/20 0519 11/04/20 0553 11/04/20 0713  NA 140   < > 137 136 137 138 139 139 139  K 5.1   < > 4.4 4.4 3.7 3.2* 3.0* 3.5 3.3*  CL 105  --  103 110 104 101 99 100 99  CO2 23  --  19* 15* 20* 28 28 25 24   GLUCOSE 106*  --  83 152* 108* 135* 129* 144* 147*  BUN 136*  --  69* 64* 110* 61* 94* 127* 127*  CREATININE 7.74*  --  4.79* 4.48* 6.74* 4.31* 6.05* 6.94* 7.05*  CALCIUM 9.4  --  8.2* 7.0* 8.0* 8.1* 8.2* 8.5* 8.3*  PHOS 4.1  --   --  5.6* 5.7* 2.4* 2.3* 2.9 2.8   < > = values in this interval not displayed.   CBC Recent Labs  Lab 10/30/20 1301 10/30/20 1757 11/01/20 0628 11/01/20 0900 11/03/20 0519 11/03/20 0838 11/03/20 1609 11/03/20 2100 11/04/20 0340 11/04/20 0713  WBC 19.8*  --  8.0  --  8.3  --   --   --   --  9.5  NEUTROABS  --   --   --   --  5.9  --   --   --   --   --   HGB 7.7*   < > 7.1*   < > 7.9*   < > 8.7* 8.7* 8.4* 8.2*  HCT 24.9*   < > 25.2*   < > 24.6*   < > 27.0* 26.9* 25.6* 25.5*  MCV 87.4  --  100.8*  --  85.7  --   --   --   --  85.3  PLT 211  --  105*  --  143*  --   --   --   --  161   < > = values in this interval not displayed.    Medications:    . amiodarone  150 mg Intravenous Once  . chlorhexidine  15 mL Mouth Rinse BID  . Chlorhexidine Gluconate Cloth  6 each Topical Q0600  . Chlorhexidine Gluconate Cloth  6 each Topical Q0600  . fentaNYL  1 patch Transdermal Q72H  . heparin injection (subcutaneous)  5,000 Units Subcutaneous Q8H  . insulin aspart  0-20 Units Subcutaneous Q4H  . lidocaine  5 mL Intradermal Once  . mouth rinse  15 mL Mouth Rinse q12n4p  . pantoprazole (PROTONIX) IV  40 mg Intravenous BID  . sodium chloride flush  10-40 mL Intracatheter Q12H  . sodium chloride flush  5 mL Intracatheter Q8H      Amya Hlad, DO  11/04/2020, 9:37 AM

## 2020-11-04 NOTE — Progress Notes (Signed)
PHARMACY - TOTAL PARENTERAL NUTRITION CONSULT NOTE  Indication:  Short bowel syndrome  Patient Measurements: Height: 6\' 2"  (188 cm) Weight: 124.9 kg (275 lb 5.7 oz) IBW/kg (Calculated) : 82.2 TPN AdjBW (KG): 95.3 Body mass index is 35.35 kg/m.  Assessment:  [name] with history of mass adjacent to head of the pancreas in 2021. 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  Developed cholecystitis in Feb 2022 and had percutaneous cholecystectomy tube placement.  Presented this admission for neuroendocrine tumor resection with Whipple procedure, also had right total colectomy, colostomy and cholecystectomy on 09/26/2020. SMV was lacerated during procedure and repaired by bovine pericardial patch. Developed hemorrhagic shock with bile leak post-op and underwent placement of biliary T tube with abdominal washout on 3/15 PM. Antimicrobial therapies discontinued 4/20. Pharmacy consulted to manage TPN.  Glucose / Insulin: no hx DM, A1c 5.2%. CBGs controlled. Utilized 9 units SSI in the past 24h hours, 60 units in TPN CV: BP back to wnl - pressors weaned off Electrolytes: K 3.3 (s/p K runs x 4 yesterday), Phos 2.8 (s/p KPhos 59mmol IV x 1 yesterday), CoCa ~10.5; others WNL Renal: CRRT started 4/9 > 4/11, now on iHD TTS (4K baths), last 4/23 (3.5 hr BFR 400), next planned 4/26 Hepatic: LFTs mildly elevated, Tbili down 2.7, alb 1.2, prealbumin 18.5 (4/18) << 22.8 (4/11) - TG peaked at 740 (3/26) - last value down to 212 (4/25) - intralipids 100g/wk  Intake / Output; MIVF: UOP 0.6 ml/kg/hr, drains (5 total): 1217ml, net +5.6L since admit    GI Imaging: 4/6 CT - pelvic fluid collection concerning for abscess 4/12 CT - no new fluid collections GI Surgeries / Procedures: 3/14 Whipple procedure with R hemicolectomy and end ileostomy, cholecystectomy 3/15 washout, VAC placement 3/17 washout, VAC replacement.  Necrosis of entire small bowel except for the PB limb and proximal 40-50cm  of jejunum 3/19 re-opening laparotomy, abd closure.  Necrotic SB. 3/21 SB resection, take down of choledochojejunal anastomosis and pancreaticojejunal anastomosis, take down of duodenojejunal anastomosis, placement of externalized biliary drain / Stamm gastrostomy tube, IA washout and placement of intraperitoneal drains 3/30 extubation 4/8 trach 4/12 left lateral abdominal drain placed by IR  Central access: R IJ CVC placed 3/15; changed to PICC placed 10/03/20 TPN start date: 09/09/2020  Nutritional Goals (RD rec on 4/21): 2800-3000 kCal, 185-210g AA, fluid >2L/day  Current Nutrition:  TPN  Plan:  - Continue concentrated TPN at 95 ml/hr  - Intralipid 20% 26mL Mon/Thurs for now to prevent EFAD.  - TPN with Intralipid 2x/wk provides a daily average of 200g AA and 2869 kCal, meeting 100% of patient needs.  - Electrolytes in TPN: Na 189mEq/L, K 15 mEq/L, increase Phos to 5 mmol/L, continue with removed Ca, Mg 3 mEq/L, Cl:Ac 1:2 - KPhos 20 mmol IV x 1 - Daily multivitamin in TPN. Remove standard trace elements and add back zinc 5mg  and selenium 17mcg. Remove chromium while on RRT. - Continue resistant SSI Q4H + 60 units insulin regular in TPN and adjust as needed - consider adding lipid to TPN MWF tomorrow   [name], PharmD PGY1 Pharmacy Resident 11/04/2020 8:11 AM

## 2020-11-04 NOTE — Progress Notes (Signed)
18 Days Post-Op  Subjective: Afebrile, vitals stable. Starting HD this morning. Received frequent IV fentanyl doses yesterday for pain. Worked with PT/OT yesterday. Endorses some nausea this morning but no other acute complaints.  Objective: Vital signs in last 24 hours: Temp:  [97.8 F (36.6 C)-99.6 F (37.6 C)] 98.6 F (37 C) (04/26 0400) Pulse Rate:  [82-103] 86 (04/26 0600) Resp:  [7-30] 19 (04/26 0600) BP: (110-139)/(66-122) 114/76 (04/26 0600) SpO2:  [94 %-98 %] 94 % (04/26 0600) FiO2 (%):  [21 %] 21 % (04/26 0356) Last BM Date:  (pta)  Intake/Output from previous day: 04/25 0701 - 04/26 0700 In: 3192.1 [I.V.:2690.1; IV Piggyback:492] Out: 2730 [Urine:1500; Drains:1230] Intake/Output this shift: No intake/output data recorded.  PE: General: resting comfortably, NAD Neuro: alert and oriented  HEENT: trach in place, site is clean Resp: normal work of breathing on trach collar CV: RRR Abdomen: soft, nondistended, wound vac in place midline incision with serosanguinous drainage. RUQ JP x1 with old blood and bile-tinged fluid. Biliary drain with bile. LUQ JP with scant old blood in drain. Pancreatic stent draining clear colorless fluid. G tube in place to gravity drainage. Extremities: warm and well-perfused  Lab Results:  Recent Labs    11/03/20 0519 11/03/20 0838 11/03/20 2100 11/04/20 0340  WBC 8.3  --   --   --   HGB 7.9*   < > 8.7* 8.4*  HCT 24.6*   < > 26.9* 25.6*  PLT 143*  --   --   --    < > = values in this interval not displayed.   BMET Recent Labs    11/03/20 0519 11/04/20 0553  NA 139 139  K 3.0* 3.5  CL 99 100  CO2 28 25  GLUCOSE 129* 144*  BUN 94* 127*  CREATININE 6.05* 6.94*  CALCIUM 8.2* 8.5*   PT/INR No results for input(s): LABPROT, INR in the last 72 hours. CMP     Component Value Date/Time   NA 139 11/04/2020 0553   NA 142 07/21/2020 0921   K 3.5 11/04/2020 0553   CL 100 11/04/2020 0553   CO2 25 11/04/2020 0553    GLUCOSE 144 (H) 11/04/2020 0553   BUN 127 (H) 11/04/2020 0553   BUN 16 07/21/2020 0921   CREATININE 6.94 (H) 11/04/2020 0553   CREATININE 1.16 03/01/2016 1607   CALCIUM 8.5 (L) 11/04/2020 0553   PROT 6.4 (L) 11/03/2020 0519   PROT 6.7 07/21/2020 0921   ALBUMIN 1.2 (L) 11/04/2020 0553   ALBUMIN 4.1 07/21/2020 0921   AST 103 (H) 11/03/2020 0519   ALT 126 (H) 11/03/2020 0519   ALKPHOS 108 11/03/2020 0519   BILITOT 2.7 (H) 11/03/2020 0519   BILITOT 0.8 07/21/2020 0921   GFRNONAA 9 (L) 11/04/2020 0553   GFRNONAA >89 09/22/2015 1008   GFRAA 82 07/21/2020 0921   GFRAA >89 09/22/2015 1008   Lipase     Component Value Date/Time   LIPASE 23 08/22/2020 0318       Assessment/Plan - NPO, TPN, ice chips for comfort - Continue PT/OT to improve strength and mobility - Amio gtt. Patient will likely need to continue on IV amio or metoprolol at discharge, will discuss with cardiology and case management to determine how this affects placement options. - Needs tunneled HD catheter and access for TPN at discharge. Will consult IR to place this on a non-dialysis day. - Place fentanyl patch today, slowly uptitrate dose to wean off IV fentanyl - VTE: SQH -  Dispo: ICU    LOS: 4 days    Michaelle Birks, MD [facility] General, Hepatobiliary and Pancreatic Surgery 11/04/20 7:24 AM

## 2020-11-04 NOTE — Progress Notes (Signed)
Progress Note  Patient Name: [name] Date of Encounter: 11/04/2020  Primary Cardiologist: Cristopher Peru, MD  Subjective   More afib/flutter rates 120 2 hours into dialysis  BP soft   Inpatient Medications    Scheduled Meds: . chlorhexidine  15 mL Mouth Rinse BID  . Chlorhexidine Gluconate Cloth  6 each Topical Q0600  . Chlorhexidine Gluconate Cloth  6 each Topical Q0600  . fentaNYL  1 patch Transdermal Q72H  . heparin injection (subcutaneous)  5,000 Units Subcutaneous Q8H  . insulin aspart  0-20 Units Subcutaneous Q4H  . lidocaine  5 mL Intradermal Once  . mouth rinse  15 mL Mouth Rinse q12n4p  . pantoprazole (PROTONIX) IV  40 mg Intravenous BID  . sodium chloride flush  10-40 mL Intracatheter Q12H  . sodium chloride flush  5 mL Intracatheter Q8H   Continuous Infusions: . sodium chloride 250 mL (10/23/20 0825)  . sodium chloride    . sodium chloride    . albumin human    . amiodarone 30 mg/hr (11/04/20 0800)  . promethazine (PHENERGAN) injection (IM or IVPB) Stopped (11/04/20 0534)  . TPN ADULT (ION) 95 mL/hr at 11/04/20 0600   PRN Meds: sodium chloride, sodium chloride, alteplase, fentaNYL (SUBLIMAZE) injection, heparin, heparin, ipratropium-albuterol, lidocaine (PF), lidocaine-prilocaine, ondansetron (ZOFRAN) IV, pentafluoroprop-tetrafluoroeth, polyvinyl alcohol, promethazine (PHENERGAN) injection (IM or IVPB), sodium chloride flush   Vital Signs    Vitals:   11/04/20 0815 11/04/20 0830 11/04/20 0845 11/04/20 0900  BP: 98/71 (!) 88/68 (!) 81/66 113/77  Pulse: 90 96 87 (!) 117  Resp: 19 20 20 20   Temp:      TempSrc:      SpO2: 95% 93% 94% 95%  Weight:      Height:        Intake/Output Summary (Last 24 hours) at 11/04/2020 0919 Last data filed at 11/04/2020 0800 Gross per 24 hour  Intake 3132.15 ml  Output 2585 ml  Net 547.15 ml   Last 3 Weights 11/04/2020 10/31/2020 10/30/2020  Weight (lbs) 214 lb 11.7 oz 214 lb 8.1 oz 216 lb 4.3 oz  Weight (kg)  97.4 kg 97.3 kg 98.1 kg     Telemetry    NSR with PVCs and short bursts of PAF- Personally Reviewed  Physical Exam   Ill black male Trach collar Lungs clear anteriorly Post abdominal surgery  TPN line right neck Plus one LE edema AICD under left clavicle  On dialysis    Labs    High Sensitivity Troponin:  No results for input(s): TROPONINIHS in the last 720 hours.    Cardiac EnzymesNo results for input(s): TROPONINI in the last 168 hours. No results for input(s): TROPIPOC in the last 168 hours.   Chemistry Recent Labs  Lab 10/30/20 0508 10/30/20 1757 11/01/20 0900 11/02/20 0455 11/03/20 0519 11/04/20 0553 11/04/20 0713  NA 140   < > 137   < > 139 139 139  K 5.1   < > 3.7   < > 3.0* 3.5 3.3*  CL 105   < > 104   < > 99 100 99  CO2 23   < > 20*   < > 28 25 24   GLUCOSE 106*   < > 108*   < > 129* 144* 147*  BUN 136*   < > 110*   < > 94* 127* 127*  CREATININE 7.74*   < > 6.74*   < > 6.05* 6.94* 7.05*  CALCIUM 9.4   < > 8.0*   < >  8.2* 8.5* 8.3*  PROT 7.9  --  6.1*  --  6.4*  --   --   ALBUMIN 1.3*   < > 1.2*   < > 1.2* 1.2* 1.3*  AST 105*  --  116*  --  103*  --   --   ALT 129*  --  121*  --  126*  --   --   ALKPHOS 93  --  87  --  108  --   --   BILITOT 3.4*  --  2.8*  --  2.7*  --   --   GFRNONAA 8*   < > 9*   < > 11* 9* 9*  ANIONGAP 12   < > 13   < > 12 14 16*   < > = values in this interval not displayed.     Hematology Recent Labs  Lab 11/01/20 4332 11/01/20 0900 11/03/20 0519 11/03/20 0838 11/03/20 2100 11/04/20 0340 11/04/20 0713  WBC 8.0  --  8.3  --   --   --  9.5  RBC 2.50*  --  2.87*  --   --   --  2.99*  HGB 7.1*   < > 7.9*   < > 8.7* 8.4* 8.2*  HCT 25.2*   < > 24.6*   < > 26.9* 25.6* 25.5*  MCV 100.8*  --  85.7  --   --   --  85.3  MCH 28.4  --  27.5  --   --   --  27.4  MCHC 28.2*  --  32.1  --   --   --  32.2  RDW 18.1*  --  18.1*  --   --   --  18.7*  PLT 105*  --  143*  --   --   --  161   < > = values in this interval not displayed.     BNPNo results for input(s): BNP, PROBNP in the last 168 hours.   DDimer No results for input(s): DDIMER in the last 168 hours.   Radiology    No results found.  Cardiac Studies   2d echo 09/26/20 1. Left ventricular ejection fraction, by estimation, is 40 to 45%. The  left ventricle has mildly decreased function. The left ventricle  demonstrates global hypokinesis. Left ventricular diastolic parameters are  consistent with Grade I diastolic  dysfunction (impaired relaxation).  2. Right ventricular systolic function is normal. The right ventricular  size is normal. Tricuspid regurgitation signal is inadequate for assessing  PA pressure.  3. The mitral valve is grossly normal. Trivial mitral valve  regurgitation.  4. The aortic valve is grossly normal. Aortic valve regurgitation is not  visualized. No aortic stenosis is present.  5. The inferior vena cava is normal in size with greater than 50%  respiratory variability, suggesting right atrial pressure of 3 mmHg.   Comparison(s): A prior study was performed on 05/25/20. Prior images  reviewed side by side. LV function has decreased slightly.   2D echo 10/31/2020 IMPRESSIONS  1. Left ventricular ejection fraction, by estimation, is 50%. The left  ventricle has mildly decreased function. The left ventricle demonstrates  regional wall motion abnormalities with severe basal inferior and  inferoseptal hypokinesis. The left  ventricular internal cavity size was mildly dilated. Left ventricular  diastolic parameters are consistent with Grade II diastolic dysfunction  (pseudonormalization).  2. Right ventricular systolic function is normal. The right ventricular  size is normal. Peak RV-RA gradient  31 mmHg.  3. Left atrial size was mildly dilated.  4. The mitral valve is normal in structure. Mild mitral valve  regurgitation. No evidence of mitral stenosis.  5. The aortic valve is tricuspid. Aortic valve regurgitation is  not  visualized. Mild aortic valve sclerosis is present, with no evidence of  aortic valve stenosis.  6. The IVC was not well-visualized.  Patient Profile     50 y.o. male with longstanding NICM, chronic systolic CHF, VT/VF with prior cardiac arrest 2011 (nonobstructive CAD by cath at that time - 20-30% ramus otherwise small vessels) s/p STJ ICD, HTN, HLD, neuroendocrine tumor. Underwent Whipple procedure 3/14 with intra-operative SMV laceration and repair by vascular with bovine pericardial patch. Complex hospital course since that time with worsening hemorrhagic shock and anemia, massive transfusion protocol, small bowel necrosis s/p resection, septic shock, tracheostomy, AKI, DIC. On 10/31/19 developed hypotension, AF RVR and 2 inappropriate ICD shocks for rapid atrial ib.  Assessment & Plan    1. Rapid atrial fib - inappropriate ICDx2 shock for this - rebolus with amiodarone and increase drip to 60 mg/hr    2. NICM/chronic systolic CHF - repeat limited echo showed low normal LVF with EF 50% and basal inferior and inferoseptal HK - dialysis for volume control ongoing   3. History of VT/VF - outpatient f/u EP for EOL and battery concerns  - No VT on amiodarone   4. Neuroendocrine tumor s/p Whipple with complex hospital course - per primary teams - persistent hypomagnesemia/hypocalcemia noted - will defer electrolyte management to primary teams - K 3.3 improved replete   5.  Anemia -has gotten large load of blood product -Hbg 8.2 this am  -per primary team  I have spent a total of 30 minutes with patient reviewing 2D echo , telemetry, EKGs, labs and examining patient as well as establishing an assessment and plan that was discussed with the patient.  > 50% of time was spent in direct patient care.    For questions or updates, please contact [place] Please consult [email or web address] for contact info under Cardiology/STEMI.  Signed, Jenkins Rouge, MD 11/04/2020, 9:19 AM

## 2020-11-04 NOTE — Progress Notes (Signed)
When changing dressings, RN noticed site where JP drain was removed yesterday, was leaking dark brown fluid. Will continue to monitor.

## 2020-11-04 NOTE — Progress Notes (Signed)
Inpatient Rehab Admissions Coordinator:   Pt. Is not medically ready for CIR at this time . He continues to require frequent  IV fentanyl. I plan to discuss case with rehab MD today, in light of likley long-term need for IV metoprolol and amiodarone. I will update medical team following that discussion.   Clemens Catholic, [place], [place] Admissions Coordinator  [phone] ([place]) [phone] (office)

## 2020-11-04 NOTE — Progress Notes (Signed)
Unable to uf pt. D/t low bp as documented. Dr. Carolin Sicks made aware. Orders to continue tx as tolerated with no uf. Pt. Stable and asymptomatic.

## 2020-11-05 DIAGNOSIS — D3A8 Other benign neuroendocrine tumors: Secondary | ICD-10-CM | POA: Diagnosis not present

## 2020-11-05 LAB — PREPARE RBC (CROSSMATCH)

## 2020-11-05 LAB — PROTIME-INR
INR: 2.2 — ABNORMAL HIGH (ref 0.8–1.2)
INR: 1.3 — ABNORMAL HIGH (ref 0.8–1.2)
Prothrombin Time: 24.7 seconds — ABNORMAL HIGH (ref 11.4–15.2)
Prothrombin Time: 16.1 seconds — ABNORMAL HIGH (ref 11.4–15.2)

## 2020-11-05 LAB — RENAL FUNCTION PANEL
Albumin: 1.3 g/dL — ABNORMAL LOW (ref 3.5–5.0)
Anion gap: 10 (ref 5–15)
BUN: 83 mg/dL — ABNORMAL HIGH (ref 6–20)
CO2: 27 mmol/L (ref 22–32)
Calcium: 8.2 mg/dL — ABNORMAL LOW (ref 8.9–10.3)
Chloride: 99 mmol/L (ref 98–111)
Creatinine, Ser: 5.06 mg/dL — ABNORMAL HIGH (ref 0.61–1.24)
GFR, Estimated: 13 mL/min — ABNORMAL LOW (ref >60)
Glucose, Bld: 140 mg/dL — ABNORMAL HIGH (ref 70–99)
Phosphorus: 3 mg/dL (ref 2.5–4.6)
Potassium: 3.3 mmol/L — ABNORMAL LOW (ref 3.5–5.1)
Sodium: 136 mmol/L (ref 135–145)

## 2020-11-05 LAB — GLUCOSE, CAPILLARY
Glucose-Capillary: 123 mg/dL — ABNORMAL HIGH (ref 70–99)
Glucose-Capillary: 127 mg/dL — ABNORMAL HIGH (ref 70–99)
Glucose-Capillary: 127 mg/dL — ABNORMAL HIGH (ref 70–99)
Glucose-Capillary: 128 mg/dL — ABNORMAL HIGH (ref 70–99)
Glucose-Capillary: 133 mg/dL — ABNORMAL HIGH (ref 70–99)
Glucose-Capillary: 144 mg/dL — ABNORMAL HIGH (ref 70–99)

## 2020-11-05 LAB — CBC
HCT: 31 % — ABNORMAL LOW (ref 39.0–52.0)
Hemoglobin: 10.1 g/dL — ABNORMAL LOW (ref 13.0–17.0)
MCH: 27.4 pg (ref 26.0–34.0)
MCHC: 32.6 g/dL (ref 30.0–36.0)
MCV: 84.2 fL (ref 80.0–100.0)
Platelets: 182 10*3/uL (ref 150–400)
RBC: 3.68 MIL/uL — ABNORMAL LOW (ref 4.22–5.81)
RDW: 18.3 % — ABNORMAL HIGH (ref 11.5–15.5)
WBC: 9.9 10*3/uL (ref 4.0–10.5)
nRBC: 0 % (ref 0.0–0.2)

## 2020-11-05 LAB — HEMOGLOBIN AND HEMATOCRIT, BLOOD
HCT: 24.6 % — ABNORMAL LOW (ref 39.0–52.0)
HCT: 31.7 % — ABNORMAL LOW (ref 39.0–52.0)
Hemoglobin: 6.2 g/dL — CL (ref 13.0–17.0)
Hemoglobin: 10.5 g/dL — ABNORMAL LOW (ref 13.0–17.0)

## 2020-11-05 LAB — APTT: aPTT: 44 seconds — ABNORMAL HIGH (ref 24–36)

## 2020-11-05 LAB — MAGNESIUM: Magnesium: 1.7 mg/dL (ref 1.7–2.4)

## 2020-11-05 MED ORDER — VITAMIN K1 10 MG/ML IJ SOLN
10.0000 mg | Freq: Once | INTRAVENOUS | Status: AC
  Administered 2020-11-05: 10 mg via INTRAVENOUS
  Filled 2020-11-05: qty 1

## 2020-11-05 MED ORDER — ZINC CHLORIDE 1 MG/ML IV SOLN
INTRAVENOUS | Status: AC
  Filled 2020-11-05: qty 1372.8

## 2020-11-05 MED ORDER — SODIUM CHLORIDE 0.9% IV SOLUTION: Freq: Once | INTRAVENOUS | Status: AC

## 2020-11-05 MED ORDER — CHLORHEXIDINE GLUCONATE CLOTH 2 % EX PADS
6.0000 | MEDICATED_PAD | Freq: Every day | CUTANEOUS | Status: DC
  Administered 2020-11-05 – 2020-11-08 (×3): 6 via TOPICAL

## 2020-11-05 NOTE — Progress Notes (Signed)
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 s/p right hemicolectomy, end ileostomy, Whipple and patch angioplasty repair of SMV. postoperative hemorrhagic shock with DIC with extensive small bowel necrosis. All of small bowel has been resected, with external drainage of the bile duct, pancreas and stomach. Extubated 3/30. Reintubated 4/5. S/p tracheostomy 4/8. Started on CRRT 4/9. Started on trach collar trials 4/10.  Discontinued CRRT 4/11. PMH including AICD, heart failure, HTN, and dyslipidemia.    PT Comments    Pt seen in conjunction with OT. Pt very motivated to participate. He is making slow, but steady progress. He was able to ambulate up to ~5 ft with min A +2 for steadying due to noted trunk sway. Pt is at risk for falls. His tolerance to activity is improving as he stood a second time to try to ambulate a step anterior and posterior without UE support before he fatigued again and needed to sit. Will continue to follow acutely. Current recommendations remain appropriate.    Follow Up Recommendations  CIR     Equipment Recommendations  Wheelchair (measurements PT);Wheelchair cushion (measurements PT);Hospital bed;Rolling walker with 5" wheels;3in1 (PT)    Recommendations for Other Services       Precautions / Restrictions Precautions Precautions: Fall Precaution Comments: Trach collar; x2 JP drains at abdomen and x2 biliary drains Restrictions Weight Bearing Restrictions: No    Mobility  Bed Mobility Overal bed mobility: Needs Assistance Bed Mobility: Rolling;Sidelying to Sit Rolling: Min assist Sidelying to sit: Min assist       General bed mobility comments: assist to initiate and assist to lift trunk. Cues for leg management off EOB.    Transfers Overall transfer level:  Needs assistance Equipment used: 2 person hand held assist Transfers: Sit to/from Stand Sit to Stand: Min assist;+2 physical assistance;+2 safety/equipment Stand pivot transfers: Min assist;+2 physical assistance;+2 safety/equipment       General transfer comment: pt requires assist to steady, demonstrating trunk sway but no overt LOB. Sit to stand 1x from EOB and 1x from recliner. MinAx2 bil HHA to stand step to L to recliner from EOB.  Ambulation/Gait Ambulation/Gait assistance: Min assist;+2 physical assistance;+2 safety/equipment Gait Distance (Feet): 5 Feet (x2 bouts of ~5 ft > ~2 ft) Assistive device: 2 person hand held assist;None Gait Pattern/deviations: Decreased step length - right;Decreased step length - left;Trunk flexed;Narrow base of support;Decreased stride length;Shuffle Gait velocity: decreased Gait velocity interpretation: <1.31 ft/sec, indicative of household ambulator General Gait Details: Pt taking side steps to L with bil HHA minAx2 to transfer to recliner, demonstrating mild trunk sway and poor feet clearance but no appreciative knee buckling. During second bout pt took step anterior and posterior without UE support before fatiguing and sitting in chair. Mild posterior trunk sway intermittently.   Stairs             Wheelchair Mobility    Modified Rankin (Stroke Patients Only)       Balance Overall balance assessment: Needs assistance Sitting-balance support: No upper extremity supported;Feet supported Sitting balance-[name]: Fair Sitting balance - Comments: able to maintain EOB sitting with supervision.  Maintains flexed posture   Standing balance support: Bilateral upper extremity supported;During functional activity Standing balance-[name]: Poor Standing balance comment: requires bil. UE support and up to min A for static standing  Cognition Arousal/Alertness: Awake/alert Behavior During Therapy:  WFL for tasks assessed/performed Overall Cognitive Status: Difficult to assess                                 General Comments: Pt follows commands consistently.  He is reserved with minimal interactions      Exercises      General Comments General comments (skin integrity, edema, etc.): Sp02 low 90s with pt on 28% Fi02; HR to 109 with activity; noted wound leakage from L side, notified RN who redressed wound; educate pt to perform LAQ and seated marching to tolerance      Pertinent Vitals/Pain Pain Assessment: Faces Faces Pain Scale: Hurts a little bit Pain Location: abdomen, with hip flexion, Pain Descriptors / Indicators: Discomfort;Grimacing Pain Intervention(s): Limited activity within patient's tolerance;Monitored during session;Repositioned    Home Living                      Prior Function            PT Goals (current goals can now be found in the care plan section) Acute Rehab PT Goals Patient Stated Goal: did not state, but assumed to improve as he was motivated to participate PT Goal Formulation: With patient Time For Goal Achievement: 11/10/20 Potential to Achieve Goals: Fair Progress towards PT goals: Progressing toward goals    Frequency    Min 3X/week      PT Plan Current plan remains appropriate    Co-evaluation PT/OT/SLP Co-Evaluation/Treatment: Yes Reason for Co-Treatment: For patient/therapist safety;To address functional/ADL transfers PT goals addressed during session: Mobility/safety with mobility;Balance OT goals addressed during session: ADL's and self-care      AM-PAC PT "6 Clicks" Mobility   Outcome Measure  Help needed turning from your back to your side while in a flat bed without using bedrails?: A Little Help needed moving from lying on your back to sitting on the side of a flat bed without using bedrails?: A Little Help needed moving to and from a bed to a chair (including a wheelchair)?: A Little Help  needed standing up from a chair using your arms (e.g., wheelchair or bedside chair)?: A Little Help needed to walk in hospital room?: A Lot Help needed climbing 3-5 steps with a railing? : Total 6 Click Score: 15    End of Session Equipment Utilized During Treatment: Gait belt;Oxygen Activity Tolerance: Patient tolerated treatment well Patient left: in chair;with call bell/phone within reach;with chair alarm set;with family/visitor present Nurse Communication: Mobility status PT Visit Diagnosis: Muscle weakness (generalized) (M62.81);Difficulty in walking, not elsewhere classified (R26.2);Pain;Unsteadiness on feet (R26.81);Other abnormalities of gait and mobility (R26.89) Pain - part of body:  (abdomen)     Time: 6301-6010 PT Time Calculation (min) (ACUTE ONLY): 30 min  Charges:  $Gait Training: 8-22 mins                     [name], PT, DPT Acute Rehabilitation Services  Pager: [phone] Office: [place] 11/05/2020, 5:04 PM

## 2020-11-05 NOTE — Progress Notes (Addendum)
[facility] Progress Note    Assessment/ Plan:   Pt is a 50 y.o. yo male with neuroendocrine tumor status post Whipple procedure right colectomy and SMV repair, developed AKI on dialysis.  1.Acute kidney injury likely ischemic ATN with hypotensive episode requiring Levophed in the setting of very complicated surgical history and multiple visits to the OR, DIC, diffuse small bowel necrosis.  Creatinine level is fluctuating represents compromised renal function with repeated insults.  No evidence of recovery so far.  He required CRRT from 4/9-10/2009.  Since then undergoing intermittent hemodialysis TTS schedule.  HD yesterday with episodes of hypotension and tachycardia. He had nearly a 1L removed via ultrafiltration. UOP of 550 cc and one unmeasured occurrence.   HD today without ultrafiltration as patient intermittently hypotensive.  Urinary output 1.5 L yesterday.  We will continue to monitor for renal recovery.  2.Acute respiratory failure required mechanical ventilation. s/p trach on 4/8 that was decannulated for >48 hours then recannulated on 4/21. Pt tolerating trach collar.  3.Neuroendocrine tumor status post Whipple procedure with extensive small bowel necrosis status post resection.  Currently on TPN.  General surgery is following.  4.Severe protein calorie malnutrition, hypoalbuminemia on TPN.  5.Hypokalemia: Managed with dialysis and TPN.  Repeat as needed.  6.Anemia: Patient's hemoglobin dropped to 5.8. He received 1 unit yesterday and is to receive another today. Transfuse as needed.   Subjective:   Patient in better spirits today. Denies cramping with HD yesterday.    Objective:   BP 106/69   Pulse 83   Temp 99.2 F (37.3 C) (Oral)   Resp (!) 22   Ht 6\' 2"  (1.88 m)   Wt 97.4 kg   SpO2 93%   BMI 27.57 kg/m   Intake/Output Summary (Last 24 hours) at 11/05/2020 0840 Last data filed at 11/05/2020 0645 Gross per 24 hour  Intake 3935.41 ml  Output  3116 ml  Net 819.41 ml   Weight change:   Physical Exam: GEN: resting in bed, in no acute distress  CV: regular rate and rhythm, no murmurs, right IJ RESP: no increased work of breathing, clear to ascultation bilaterally with no crackles, wheezes, or rhonchi, trach collar  ABD: Bowel sounds present, soft MSK: no LE edema SKIN: warm, dry    Imaging: No results found.  Labs: BMET Recent Labs  Lab 10/31/20 0602 11/01/20 0900 11/02/20 0455 11/03/20 0519 11/04/20 0553 11/04/20 0713 11/05/20 0711  NA 136 137 138 139 139 139 136  K 4.4 3.7 3.2* 3.0* 3.5 3.3* 3.3*  CL 110 104 101 99 100 99 99  CO2 15* 20* 28 28 25 24 27   GLUCOSE 152* 108* 135* 129* 144* 147* 140*  BUN 64* 110* 61* 94* 127* 127* 83*  CREATININE 4.48* 6.74* 4.31* 6.05* 6.94* 7.05* 5.06*  CALCIUM 7.0* 8.0* 8.1* 8.2* 8.5* 8.3* 8.2*  PHOS 5.6* 5.7* 2.4* 2.3* 2.9 2.8 3.0   CBC Recent Labs  Lab 10/30/20 1301 10/30/20 1757 11/01/20 0628 11/01/20 0900 11/03/20 0519 11/03/20 0838 11/04/20 0713 11/04/20 1500 11/04/20 2059 11/05/20 0540  WBC 19.8*  --  8.0  --  8.3  --  9.5  --   --   --   NEUTROABS  --   --   --   --  5.9  --   --   --   --   --   HGB 7.7*   < > 7.1*   < > 7.9*   < > 8.2* 8.1* 5.8* 6.2*  HCT 24.9*   < > 25.2*   < > 24.6*   < > 25.5* 25.2* 22.6* 24.6*  MCV 87.4  --  100.8*  --  85.7  --  85.3  --   --   --   PLT 211  --  105*  --  143*  --  161  --   --   --    < > = values in this interval not displayed.    Medications:    . sodium chloride   Intravenous Once  . chlorhexidine  15 mL Mouth Rinse BID  . Chlorhexidine Gluconate Cloth  6 each Topical Q0600  . Chlorhexidine Gluconate Cloth  6 each Topical Q0600  . fentaNYL  1 patch Transdermal Q72H  . insulin aspart  0-20 Units Subcutaneous Q4H  . lidocaine  5 mL Intradermal Once  . mouth rinse  15 mL Mouth Rinse q12n4p  . pantoprazole (PROTONIX) IV  40 mg Intravenous BID  . sodium chloride flush  10-40 mL Intracatheter Q12H  . sodium  chloride flush  5 mL Intracatheter Q8H      Jahnya Trindade, DO  11/05/2020, 8:40 AM

## 2020-11-05 NOTE — Progress Notes (Signed)
PHARMACY - TOTAL PARENTERAL NUTRITION CONSULT NOTE  Indication:  Short bowel syndrome  Patient Measurements: (updated) Height: 6\' 2"  (188 cm) Weight: 97.4 kg (214 lb 11.7 oz) IBW/kg (Calculated) : 82.2 TPN AdjBW (KG): 95.3 Body mass index is 27.57 kg/m.  Assessment:  64 YOM with history of mass adjacent to head of the pancreas in 2021. 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  Developed cholecystitis in Feb 2022 and had percutaneous cholecystectomy tube placement.  Presented this admission for neuroendocrine tumor resection with Whipple procedure, also had right total colectomy, colostomy and cholecystectomy on 10/06/2020. SMV was lacerated during procedure and repaired by bovine pericardial patch. Developed hemorrhagic shock with bile leak post-op and underwent placement of biliary T tube with abdominal washout on 3/15 PM. Antimicrobial therapies discontinued 4/20. Pharmacy consulted to manage TPN.   Glucose / Insulin: no hx DM, A1c 5.2%. CBGs controlled. Utilized 3 units SSI in the past 24h hours, 60 units in TPN CV: BP back to wnl - pressors weaned off Electrolytes: K 3.3, Phos 3.0, CoCa ~10.5, Mag 1.7, others WNL, Renal: CRRT started 4/9 > 4/11, now on iHD TTS (4K baths), last 4/23 (3.5 hr BFR 400), next planned 4/26 Hepatic: LFTs mildly elevated, Tbili down 2.7, alb 1.3, prealbumin 18.5 (4/18) << 22.8 (4/11) - TG peaked at 740 (3/26) - last value down to 212 (4/25) - intralipids 100g/wk  Intake / Output; MIVF: UOP 0.6 ml/kg/hr, drains (5 total): 1271ml, net +5.6L since admit    GI Imaging: 4/6 CT - pelvic fluid collection concerning for abscess 4/12 CT - no new fluid collections GI Surgeries / Procedures: 3/14 Whipple procedure with R hemicolectomy and end ileostomy, cholecystectomy 3/15 washout, VAC placement 3/17 washout, VAC replacement.  Necrosis of entire small bowel except for the PB limb and proximal 40-50cm of jejunum 3/19 re-opening laparotomy,  abd closure.  Necrotic SB. 3/21 SB resection, take down of choledochojejunal anastomosis and pancreaticojejunal anastomosis, take down of duodenojejunal anastomosis, placement of externalized biliary drain / Stamm gastrostomy tube, IA washout and placement of intraperitoneal drains 3/30 extubation 4/8 trach 4/12 left lateral abdominal drain placed by IR  Central access: R IJ CVC placed 3/15; changed to PICC placed 10/03/20 TPN start date: 09/29/2020  Nutritional Goals (RD rec on 4/21): 2800-3000 kCal, 185-210g AA, fluid >2L/day  Current Nutrition:  TPN  Plan:  Goal rate 132ml/hr with 19g/L SMOF lipid on MWF --TPN will provide 2833 kcal for weekly average and 205g protein daily --on lipid days (MWF): 3120 kcal with 205g protein, 528g CHO, 50g SMOF lipid --on NON-lipid days (TThSaSun): 2618kcal, 528g CHO, 205g protein   - Electrolytes in TPN: Na 166mEq/L, K 20 mEq/L, Phos to 5 mmol/L, continue with removed Ca, Mg 5 mEq/L, Cl:Ac 1:2 - Daily multivitamin in TPN. Remove standard trace elements and add back zinc 5mg  and selenium 52mcg. Remove chromium while on RRT. - Continue resistant SSI Q4H + 55 units insulin regular in TPN and adjust as needed - Continue twice weekly triglyceride levels   Wilson Singer, PharmD PGY1 Pharmacy Resident 11/05/2020 8:49 AM

## 2020-11-05 NOTE — Progress Notes (Signed)
Referring Physician(s): [name],S  Supervising Physician: Dr. Annamaria Boots  Patient Status:  [facility] - In-pt  Chief Complaint:  Left abdominal fluid collection/abscess  Subjective: Pt resting in bed, no c/o   Allergies: Ace inhibitors  Medications:  Current Facility-Administered Medications:  .  0.9 %  sodium chloride infusion (Manually program via Guardrails IV Fluids), , Intravenous, Once, Lovick, [name], MD .  0.9 %  sodium chloride infusion, 250 mL, Intravenous, Continuous, [name], MD, Last Rate: 20 mL/hr at 10/23/20 0825, 250 mL at 10/23/20 0825 .  0.9 %  sodium chloride infusion, 100 mL, Intravenous, PRN, [name], MD .  0.9 %  sodium chloride infusion, 100 mL, Intravenous, PRN, [name], MD .  albumin human 25 % solution 50 g, 50 g, Intravenous, Once, [name], MD .  alteplase (CATHFLO ACTIVASE) injection 2 mg, 2 mg, Intracatheter, Once PRN, [name], MD .  Margrett Rud amiodarone (NEXTERONE) 1.8 mg/mL load via infusion 150 mg, 150 mg, Intravenous, Once, 150 mg at 10/30/20 1711 **FOLLOWED BY** [EXPIRED] amiodarone (NEXTERONE PREMIX) 360-4.14 MG/200ML-% (1.8 mg/mL) IV infusion, 60 mg/hr, Intravenous, Continuous, Last Rate: 16.67 mL/hr at 10/31/20 0400, 30 mg/hr at 10/31/20 0400 **FOLLOWED BY** amiodarone (NEXTERONE PREMIX) 360-4.14 MG/200ML-% (1.8 mg/mL) IV infusion, 60 mg/hr, Intravenous, Continuous, Nishan, [name], MD, Last Rate: 33.3 mL/hr at 11/05/20 0900, 60 mg/hr at 11/05/20 0900 .  chlorhexidine (PERIDEX) 0.12 % solution 15 mL, 15 mL, Mouth Rinse, BID, Lovick, [name], MD, 15 mL at 11/05/20 1017 .  Chlorhexidine Gluconate Cloth 2 % PADS 6 each, 6 each, Topical, Q0600, [name], MD, 6 each at 11/04/20 0945 .  Chlorhexidine Gluconate Cloth 2 % PADS 6 each, 6 each, Topical, Q0600, [name], MD, 6 each at 11/03/20 1732 .  fentaNYL (DURAGESIC) 50 MCG/HR 1 patch, 1 patch, Transdermal, Q72H, [name], MD,  1 patch at 11/04/20 [phone] .  fentaNYL (SUBLIMAZE) injection 50-100 mcg, 50-100 mcg, Intravenous, Q2H PRN, [name], MD, 100 mcg at 11/05/20 0936 .  heparin injection 1,000 Units, 1,000 Units, Dialysis, PRN, [name], MD, 1,000 Units at 11/04/20 0940 .  heparin injection 1,900 Units, 20 Units/kg, Dialysis, PRN, [name], MD .  insulin aspart (novoLOG) injection 0-20 Units, 0-20 Units, Subcutaneous, Q4H, Bowser, [name], NP, 3 Units at 11/05/20 0735 .  ipratropium-albuterol (DUONEB) 0.5-2.5 (3) MG/3ML nebulizer solution 3 mL, 3 mL, Nebulization, Q6H PRN, Agarwala, Ravi, MD .  lidocaine (PF) (XYLOCAINE) 1 % injection 5 mL, 5 mL, Intradermal, PRN, [name], MD .  lidocaine (XYLOCAINE) 1 % (with pres) injection 5 mL, 5 mL, Intradermal, Once, [name], MD .  lidocaine-prilocaine (EMLA) cream 1 application, 1 application, Topical, PRN, [name], MD .  MEDLINE mouth rinse, 15 mL, Mouth Rinse, q12n4p, [name], MD, 15 mL at 11/04/20 1601 .  ondansetron (ZOFRAN) injection 4 mg, 4 mg, Intravenous, Q6H PRN, [name], MD, 4 mg at 11/04/20 2330 .  pantoprazole (PROTONIX) injection 40 mg, 40 mg, Intravenous, BID, Agarwala, Ravi, MD, 40 mg at 11/05/20 1014 .  pentafluoroprop-tetrafluoroeth (GEBAUERS) aerosol 1 application, 1 application, Topical, PRN, [name], MD .  phytonadione (VITAMIN K) 10 mg in dextrose 5 % 50 mL IVPB, 10 mg, Intravenous, Once, [name], MD, Last Rate: 50 mL/hr at 11/05/20 1019, 10 mg at 11/05/20 1019 .  polyvinyl alcohol (LIQUIFILM TEARS) 1.4 % ophthalmic solution 1 drop, 1 drop, Both Eyes,  PRN, Donalynn Furlong, NP .  promethazine (PHENERGAN) 6.25 mg in sodium chloride 0.9 % 50 mL IVPB, 6.25 mg, Intravenous, Q6H PRN, [name], MD, Stopped at 11/04/20 1953 .  sodium chloride flush (NS) 0.9 % injection 10-40 mL, 10-40 mL, Intracatheter, Q12H, [name], MD, 10 mL at 11/05/20 1020 .   sodium chloride flush (NS) 0.9 % injection 10-40 mL, 10-40 mL, Intracatheter, PRN, [name], MD, 10 mL at 10/20/20 0930 .  sodium chloride flush (NS) 0.9 % injection 5 mL, 5 mL, Intracatheter, Q8H, [name], MD, 5 mL at 11/05/20 0645 .  TPN ADULT (ION), , Intravenous, Continuous TPN, [name], [facility], Last Rate: 95 mL/hr at 11/05/20 0900, Infusion Verify at 11/05/20 0900    Vital Signs: BP 108/63   Pulse 81   Temp 99.2 F (37.3 C)   Resp (!) 22   Ht 6\' 2"  (1.88 m)   Wt 97.4 kg   SpO2 93%   BMI 27.57 kg/m   Physical Exam awake/alert; left IR abd drain intact, still with old bloody output  Imaging: No results found.  Labs:  CBC: Recent Labs    10/30/20 1301 10/30/20 1757 11/01/20 0628 11/01/20 0900 11/03/20 0519 11/03/20 0838 11/04/20 0713 11/04/20 1500 11/04/20 2059 11/05/20 0540  WBC 19.8*  --  8.0  --  8.3  --  9.5  --   --   --   HGB 7.7*   < > 7.1*   < > 7.9*   < > 8.2* 8.1* 5.8* 6.2*  HCT 24.9*   < > 25.2*   < > 24.6*   < > 25.5* 25.2* 22.6* 24.6*  PLT 211  --  105*  --  143*  --  161  --   --   --    < > = values in this interval not displayed.    COAGS: Recent Labs    09/30/2020 1947 09/24/20 0400 09/24/20 1650 October 25, 2020 1909 09/16/2020 1624 10/21/20 1418 10/30/20 1722 11/05/20 0656  INR 0.9   < > 1.4* 1.4* 1.4* 1.9* 1.3* 2.2*  APTT 35  --  32 39*  --   --   --  44*   < > = values in this interval not displayed.    BMP: Recent Labs    12/03/19 1703 05/24/20 1956 07/03/20 1643 07/21/20 0921 08/21/20 1037 11/03/20 0519 11/04/20 0553 11/04/20 0713 11/05/20 0711  NA 144   < > 142 142   < > 139 139 139 136  K 4.6   < > 3.7 4.2   < > 3.0* 3.5 3.3* 3.3*  CL 109*   < > 103 104   < > 99 100 99 99  CO2 21   < > 25 27   < > 28 25 24 27   GLUCOSE 103*   < > 105* 83   < > 129* 144* 147* 140*  BUN 15   < > 18 16   < > 94* 127* 127* 83*  CALCIUM 9.5   < > 9.1 9.5   < > 8.2* 8.5* 8.3* 8.2*  CREATININE 1.27   < > 0.96 1.19   < > 6.05*  6.94* 7.05* 5.06*  GFRNONAA 66   < > 92 71   < > 11* 9* 9* 13*  GFRAA 77  --  107 82  --   --   --   --   --    < > = values  in this interval not displayed.    LIVER FUNCTION TESTS: Recent Labs    10/27/20 0457 10/28/20 0455 10/30/20 0508 10/31/20 0602 11/01/20 0900 11/02/20 0455 11/03/20 0519 11/04/20 0553 11/04/20 0713 11/05/20 0711  BILITOT 3.6*  --  3.4*  --  2.8*  --  2.7*  --   --   --   AST 112*  --  105*  --  116*  --  103*  --   --   --   ALT 125*  --  129*  --  121*  --  126*  --   --   --   ALKPHOS 82  --  93  --  87  --  108  --   --   --   PROT 7.4  --  7.9  --  6.1*  --  6.4*  --   --   --   ALBUMIN 1.3*   < > 1.3*   < > 1.2*   < > 1.2* 1.2* 1.3* 1.3*   < > = values in this interval not displayed.    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 Zenia Resides; complicated by hemorrhagic shockwith DIC and subsequent diffuse small bowel necrosis s/p small bowel resection 9/37/1696; further complicated by intra-abdominal fluid collection,s/p left lateral abdominal drain placement in IR 10/21/2020. Output still old bloody  Electronically Signed: Ascencion Dike, PA-C 11/05/2020, 10:27 AM   I spent a total of 15 minutes at the the patient's bedside AND on the patient's hospital floor or unit, greater than 50% of which was counseling/coordinating care for left abdominal abscess drain    Patient ID: [name], male   DOB: 10/02/1970, 50 y.o.   MRN: [id number]

## 2020-11-05 NOTE — Progress Notes (Signed)
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 s/p right hemicolectomy, end ileostomy, Whipple and patch angioplasty repair of SMV. postoperative hemorrhagic shock with DIC with extensive small bowel necrosis. All of small bowel has been resected, with external drainage of the bile duct, pancreas and stomach. Extubated 3/30. Reintubated 4/5. S/p tracheostomy 4/8. Started on CRRT 4/9. Started on trach collar trials 4/10.  Discontinued CRRT 4/11. PMH including AICD, heart failure, HTN, and dyslipidemia.   OT comments  Pt seen in conjunction with PT.  He is making slow, but steady progress.  He was able to take ~5 steps to chair, then take 2 more steps with min A +2.  He took one seated rest break and then was able to take 2 steps before fatiguing.  HR to 109.   Pt continues to very motivated.  He requires max A, overall, for ADLs.   Follow Up Recommendations  CIR    Equipment Recommendations  None recommended by OT    Recommendations for Other Services      Precautions / Restrictions Precautions Precautions: Fall Precaution Comments: Trach collar; x2 JP drains at abdomen and x2 biliary drains; wound vac       Mobility Bed Mobility Overal bed mobility: Needs Assistance Bed Mobility: Rolling;Sidelying to Sit Rolling: Min assist Sidelying to sit: Min assist       General bed mobility comments: assist to initiate and assist to lift trunk    Transfers Overall transfer level: Needs assistance Equipment used: 2 person hand held assist Transfers: Sit to/from Stand Sit to Stand: Min assist;+2 physical assistance;+2 safety/equipment Stand pivot transfers: Min assist;+2 physical assistance;+2 safety/equipment       General transfer comment: pt requires assist to steady    Balance Overall  balance assessment: Needs assistance Sitting-balance support: No upper extremity supported;Feet supported Sitting balance-[name]: Fair Sitting balance - Comments: able to maintain EOB sitting with supervision.  Maintains flexed posture   Standing balance support: Bilateral upper extremity supported;During functional activity Standing balance-[name]: Poor Standing balance comment: requires bil. UE support and up to min A for static standing                           ADL either performed or assessed with clinical judgement   ADL Overall ADL's : Needs assistance/impaired                         Toilet Transfer: Minimal assistance;+2 for physical assistance;+2 for safety/equipment;Ambulation;Comfort height toilet           Functional mobility during ADLs: Minimal assistance;+2 for physical assistance;+2 for safety/equipment       Vision       Perception     Praxis      Cognition Arousal/Alertness: Awake/alert Behavior During Therapy: WFL for tasks assessed/performed Overall Cognitive Status: Difficult to assess                                 General Comments: Pt follows commands consistently.  He is reserved with minimal interactions        Exercises     Shoulder Instructions       General Comments Sp02 low 90s with pt  on 28% Fi02; HR to 109 with activity    Pertinent Vitals/ Pain       Pain Assessment: Faces Faces Pain Scale: Hurts a little bit Pain Location: abdomen, with hip flexion, Pain Descriptors / Indicators: Discomfort;Grimacing  Home Living                                          Prior Functioning/Environment              Frequency  Min 2X/week        Progress Toward Goals  OT Goals(current goals can now be found in the care plan section)  Progress towards OT goals: Progressing toward goals     Plan Discharge plan remains appropriate    Co-evaluation    PT/OT/SLP  Co-Evaluation/Treatment: Yes Reason for Co-Treatment: To address functional/ADL transfers;For patient/therapist safety   OT goals addressed during session: ADL's and self-care      AM-PAC OT "6 Clicks" Daily Activity     Outcome Measure   Help from another person eating meals?: None Help from another person taking care of personal grooming?: A Lot Help from another person toileting, which includes using toliet, bedpan, or urinal?: A Lot Help from another person bathing (including washing, rinsing, drying)?: A Lot Help from another person to put on and taking off regular upper body clothing?: A Lot Help from another person to put on and taking off regular lower body clothing?: A Lot 6 Click Score: 14    End of Session Equipment Utilized During Treatment: Oxygen  OT Visit Diagnosis: Unsteadiness on feet (R26.81);Other abnormalities of gait and mobility (R26.89);Muscle weakness (generalized) (M62.81);Pain Pain - part of body:  (abdomen)   Activity Tolerance Patient tolerated treatment well   Patient Left in chair;with call bell/phone within reach;with chair alarm set;with family/visitor present   Nurse Communication Mobility status        Time: 2229-7989 OT Time Calculation (min): 30 min  Charges: OT General Charges $OT Visit: 1 Visit OT Treatments $Therapeutic Activity: 8-22 mins  Nilsa Nutting OTR/L Acute Rehabilitation Services Pager [phone] Office [phone]    Lucille Passy M 11/05/2020, 4:41 PM

## 2020-11-05 NOTE — Progress Notes (Signed)
Nutrition Follow-up  DOCUMENTATION CODES:   Not applicable  INTERVENTION:   TNA to meet nutrition needs 20% intralipid 250 ml/12 hr every Mon, Thur to prevent EFAD   Pt at high risk of malnutrition due to catabolic state.   NUTRITION DIAGNOSIS:   Increased nutrient needs related to post-op healing as evidenced by estimated needs. Ongoing.   GOAL:   Patient will meet greater than or equal to 90% of their needs Met on TNA.   MONITOR:   Labs,I & O's  REASON FOR ASSESSMENT:   Ventilator    ASSESSMENT:   Pt with a PMH of HTN, HLD, HF with reduced EF, AICD and dx mass adjacent to head of the pancreas in 2021.  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  Developed cholecystitis in Feb 2022 and had percutaneous cholecystectomy tube placement.  Presented this admission for neuroendocrine tumor resection with Whipple procedure, also had right total colectomy, colostomy and cholecystectomy on 10/01/2020.  SMV was lacerated during procedure and repaired by bovine pericardial patch.  Developed hemorrhagic shock with bile leak post-op and underwent placement of biliary T tube with abdominal washout on 3/15.    Pt discussed during ICU rounds and with RN. Per CCM [facility] evaluating for SB transplant.  Per MD tolerating iHD, nephrology monitoring daily for HD needs, currently receiving TTS. Per nephrology no renal recovery seen yet.  Pt has tolerated TC since events of 4/21. No plans to de-cannulate for now.  Plan for CIR next week. Noted total drain output > 1 L currently and abd VAC removed.   3/14 s/p whipple; intra-operative SMV laceration s/p repair; massive transfusion protocol - 120 products; maxed on pressors  3/15 s/p washout and VAC placement  3/16 off pressors; VAC output decreasing, hematemesis with NG not functioning, OG placed 3/17 s/p re-opening of recent laparotomy, SBR, VAC placement (per OR report pt with SB necrosis, only viable areas are stomach  pb limb, 40-50 cm beyond GJ anastomosis, and colon) Pt will be TPN dependent. Concern that PB limb may become necrotic.  3/18 TPN initiated @ 45 ml/hr with plans to advance to 90 ml on 3/19 3/22 entirety of SB resected due to necrosis.  3/25 lipids removed from TPN due to elevated TG 3/30 extubated  4/5 re-intubated 4/9-4/12 CRRT 4/13 iHD 4/21 decannulated; pt became hypotensive after HD required re-cannulation levo and neo.  4/22 pt back to trach collar  4/27 wound VAC off now wet to dry dressings, decrease in hgb overnight given 1 u PRBC.   Medications reviewed and include: SSI, novolog, protonix Vitamin K x 1  IV phenergan PRN TPN @ 95 ml/hr  Labs reviewed: BUN: 83, Cr: 5.06, TG: 740 --> 212 Hgb: 6.2 CBG's: 123   Output:  UOP: 550 ml    Drain 3: 455 ml  LLQ drain: 20 ml  R drain: 515 ml  RLQ drain: 365 ml  20 F G tube: 50 ml Abd VAC: 225 ml now removed  Total output: 1630 ml  UF: 936 ml I&O: +16 L  TPN @ 95 ml/hr (250 ml 20% intralipid Mon, Thur) 7 day average provides: 2869 kcal and 200 grams protein   Diet Order:   Diet Order            Diet NPO time specified  Diet effective now                 EDUCATION NEEDS:   No education needs have been  identified at this time  Skin:  Skin Assessment: Skin Integrity Issues: Skin Integrity Issues:: Stage III Stage III: neck Wound Vac: NA Incisions: abdomen, neck  Last BM:  none  Height:   Ht Readings from Last 1 Encounters:  10/14/20 6' 2"  (1.88 m)    Weight:   Wt Readings from Last 1 Encounters:  11/04/20 97.4 kg    Ideal Body Weight:     BMI:  Body mass index is 27.57 kg/m.  Estimated Nutritional Needs:   Kcal:  2800-3000  Protein:  185-210 grams  Fluid:  > 2L/day  Lockie Pares., RD, LDN, CNSC See AMiON for contact information

## 2020-11-05 NOTE — Progress Notes (Addendum)
Palliative Care-  Chart reviewed- noted patient is slowly progressing- tolerating trach collar, working with PT. Attempted to visit with patient today, however, he was receiving personal care.  Goals of care have been previously clearly established- full scope, full code- family is aware this likely includes ongoing significant debilitation and facility settings.  Palliative will continue to follow with intermittent chart reviews. If patient has significant decompensation or if medical therapies become maximized and patient is not progressing, please feel free to reach out and we will reconsult.   Mariana Kaufman, AGNP-C Palliative Medicine  No charge

## 2020-11-05 NOTE — Progress Notes (Signed)
[name], MRN:  [id number], DOB:  1970-11-02, LOS: 33 ADMISSION DATE:  09/29/2020, CONSULTATION DATE:  11/05/20 REFERRING MD:  Anesthesia, CHIEF COMPLAINT:  Whipple, post-op shock   Brief History:  50 y.o. M with PMH of neuroendocrine tumor and underwent Whipple procedure 3/14 with intra-operative SMV laceration and repair by vascular with bovine pericardial patch.  Pt had worsening shock overnight and required three pressors despite massive transfusion protocol (received 150 blood products).  He was taken back to the OR for washout on 3/15 and returned to the ICU intubated.  Taken back 3/17 again found to have small bowel necrosis s/p resection.  Plans again to return 3/19 to OR.   Totality of small bowel has been resected. The case was discussed with [facility] who may consider eval for sm intestinal transplant pending clinical course.  Continuing aggressive supportive care.   Past Medical History:   has a past medical history of AICD (automatic cardioverter/defibrillator) present, Dyslipidemia, Heart failure with mildly reduced EF, History of kidney stones, HTN (hypertension), ventricular fibrillation, and Nonischemic cardiomyopathy.  Significant Hospital Events:  3/14: Admit to Surgery, to OR for whipple, SMV injury and repair, massive transfusion protocol overnight  3/15: Back to OR for washout, x2. IR for arteriogram. No major bleed source identified in OR, or arterial bleed w IR. Robust product resuscitation >75 products (+ TXA + novoseven) 3/16: off pressors, add'l 39 products overnight. Slowing resuscitation this morning with hemodynamic improvements and slowing drain output; OR x 2 - washout, biliary t tube, wound vac -- washout, wound vac, IR for arteriogram -- no arterial bleed for embolization 3/17:  Aggressive balanced transfusion continue overnight with marked hemodynamic improvement since yesterday evening. Off pressors x several hours, Wound vac output significantly slowing  (from 545ml q15-69min to 564ml q1hr+) and output is much thinner, Thin bloody secretions from mouth approx 259ml, Decreased RR from 22 to 18, Unable to tolerate NE- severe bradycardia 3/19 taken back to OR. Small bowel noted to be almost completely necrosed. Patient was closed with plans for goals of car discussion.  3/20 GOC family endorses full scope of treatment. Primary team discussed with [facility] the possibility of transfer for small bowel transplant. [facility] felt as though transfer would not change outcome. 3/21 Ongoing discussion with family and [facility]. Patient may be a candidate for transplant if he can stabilize post a small bowel resection. He was taken back to the OR and small bowel was resected.  3/23 weaning pressors. Versed off. Remains encephalopathic 3/24 off pressors. Low dose dilaudid gtt -- only weakly grimaces to pain. Changing sedation to precedex + PRN fentanyl. Long discussion with 2 brothers regarding clinical case -- tried to clarify that while the term "stable" has been used, in this instance is meaning that he has not declined from previous shift but is in fact still critically ill with multisystem organ dysfunction.  3/25 Cr and BUN increased. Off sedation  3/26 Pressors off, CT head benign, BUN/Cr continue to creep up 3/27 Tachycardia/HTN overnight improved with fentanyl gtt 2/28 PCCM sign off. Trauma to take over vent/CCM needs.  3/30 extubated 4/2 desaturated overnight to the 30s improved with BVM and NTS.  4/5 poor airway protection. PCCM consulted for re-intubation.  4/6 CT a/p Large RLL opacity, smaller LLL opacity. Post op changes. 5.4cm fluid collection in posterior pelvis, concerning for abscess. Started on Zosyn  4/7 got transfused overnight and then diuresed. Worse renal function 4/9 transfused again. HD line inserted and started on  CRRT 4/10 started on trach collar trials. 4/12 off CRRT and transitioned to IHD. Surgery placed abdominal drain with minimal output--  during procedure seemed c/w hematoma however since grew out klebsiella  4/13, 4/14 Tolerated dialysis 4/15 Placed on vent overnight for rest 4/16 Tolerating trach collar 4/17 got HD. Trach dislodged overnight, replaced with #6 cuffless shiley.  4/18 afebrile, trach collar + PMV. Up to chair with PT  4/21 called back for hypotension, Afib/RVR, increased output from his 5 drains and wound vac.Transfused 2 units, re-cannulated, on Levo and Neo. 4/24 Continues to tolerate trach collar, 5L 28%   Consults:  PCCM VVS  IR  Palliative  Procedures:  PICC 3/25 > JP x 2  Biliary drain Pancreatic stent drain ETT 2/24>>3/13; 3/15 >> 3/29;  4/5>>4/8 Trach 4/8> L lateral abdominal drain 4/12>  Significant Diagnostic Tests:  See radiology tab Most recent significant imaging:  4/12 CT abd/pelvis>>  Unchanged ill-defined fluid collections within the left side of the root of the abdominal mesentery, the ventral aspect of the abdominal mesentery, the subcapsular aspect of the right lobe of the liver and the pelvic cul-de-sac all of which are incompletely evaluated on this noncontrast examination though may represent areas of evolving hematomas giving imaging stability for the past week. Redemonstrated ill-defined stranding surrounding the remaining portions of the pancreas without definable/drainable fluid collection on this noncontrast examination.  Micro Data:  See micro tab Most recent significant findings:   4/5 trach asp >> Klebsiella pneumoniae 4/12 Pelvic abscess >> ampicillin resistant Klebsiella pneumoniae 4/12 BCx> no growth   Antimicrobials:  Zosyn 3/21 > 3/29; 4/5 >4/19 Eraxis 3/23> 3/29, 4/12>4/19  Interim History / Subjective:   Continues on trach collar. Still copious secretions. Able to talk with PMV. Episode of tachycardia with dialysis yesterday.   Objective   Blood pressure 105/66, pulse 83, temperature 99.2 F (37.3 C), temperature source Oral, resp. rate 20, height 6'  2" (1.88 m), weight 97.4 kg, SpO2 93 %.    FiO2 (%):  [21 %] 21 %   Intake/Output Summary (Last 24 hours) at 11/05/2020 0814 Last data filed at 11/05/2020 0645 Gross per 24 hour  Intake 3935.41 ml  Output 3116 ml  Net 819.41 ml   Filed Weights   10/30/20 0500 10/31/20 0500 11/04/20 0700  Weight: 98.1 kg 97.3 kg 97.4 kg   Physical Exam:  General:  Resting, no distress, de-conditioned appearing HEENT: MM pink/moist, trach in place on ATC, sclera anicteric Neuro: opens eyes and nods to questions CV: s1s2 rrr, no m/r/g PULM:  Clear bilaterally without rhonchi or wheezing, on ATC 5L 28% with sats 94% GI: soft, bsx4 active  Extremities: warm/dry, no edema  Skin: no rashes or lesions   Resolved Hospital Problem list   Hemorrhagic shock hypophosphatemia Septic shock  Klebsiella pneumoniae pneumonia Acute hypoxemic respiratory failure requiring mechanical ventilation  Assessment & Plan:   Acute respiratory failure with hypoxia  S/p Tracheostomy -- decanulated -- re-inserting 4/21  Tolerating trach collar since 4/18  -continue trach collar, would recommend transitioning to cuffless tracheostomy, but not attempting decannulation until later in stay when patient is consistently ambulating.   PAF  - Cardiology plans on eventually transitioning to bolus iv amiodarone.   Short gut syndrome - needs tunneled line for long-term TPN  AKI now on HD - needs tunneled HD line.    Daily Goals Checklist  Per primary Code Status: full Code aggressive care.  Concerned however that long-term plan and candidacy for small bowel transplantation  are far from certain. Decision making is further complicated by the fact that the patient is young and is otherwise progressing. Strongly suggest Palliative care and Ethics consultation to establish lines of communication with family and shared decision making.  Kipp Brood, MD [facility] ICU Physician [place]  Pager: [phone] Or  Epic Secure Chat After hours: [phone].  11/05/2020, 8:29 AM

## 2020-11-05 NOTE — Progress Notes (Signed)
Made aware of Hgb of 5.8 at 2144. Dr. Bobbye Morton made aware at 2150. New orders to transfuse 1 unit of PRBCs, and to draw a CBC at appropriate time after. New type and screen also needed was also needed, so this RN drew type and screen lab.

## 2020-11-05 NOTE — Progress Notes (Signed)
19 Days Post-Op  Subjective: Had tachycardia and low BP during HD yesterday, no pressor requirement. Hgb 5.8 last night, transfused 1u PRBCs. Hgb 6.2 this morning. Patient is in sinus rhythm and normotensive. No new bloody output noted from any drains. Increased output from vac overnight, not frankly bloody.  Objective: Vital signs in last 24 hours: Temp:  [97.9 F (36.6 C)-98.9 F (37.2 C)] 98.8 F (37.1 C) (04/27 0345) Pulse Rate:  [75-120] 86 (04/27 0600) Resp:  [17-31] 22 (04/27 0600) BP: (81-129)/(57-81) 115/66 (04/27 0600) SpO2:  [91 %-97 %] 94 % (04/27 0600) FiO2 (%):  [21 %] 21 % (04/27 0329) Last BM Date:  (pta)  Intake/Output from previous day: 04/26 0701 - 04/27 0700 In: 4282.5 [I.V.:3298.6; Blood:427; IV Piggyback:556.9] Out: 3116 [Urine:550; Drains:1630] Intake/Output this shift: No intake/output data recorded.  PE: General: resting comfortably, NAD Neuro: alert and oriented  HEENT: trach in place, site is clean Resp: normal work of breathing on trach collar CV: RRR Abdomen: soft, nondistended, wound vac removed and wound bed has pink healthy granulation tissue, no fascial defects noted, there is minimal drainage from inferior aspect. RUQ JP x1 with old blood and bile-tinged fluid. Biliary drain with bile. LUQ JP with scant old blood in drain. Pancreatic stent draining clear colorless fluid. G tube in place to gravity drainage, draining clear nonbloody gastric contents. Extremities: warm and well-perfused  Lab Results:  Recent Labs    11/03/20 0519 11/03/20 0838 11/04/20 0713 11/04/20 1500 11/04/20 2059 11/05/20 0540  WBC 8.3  --  9.5  --   --   --   HGB 7.9*   < > 8.2*   < > 5.8* 6.2*  HCT 24.6*   < > 25.5*   < > 22.6* 24.6*  PLT 143*  --  161  --   --   --    < > = values in this interval not displayed.   BMET Recent Labs    11/04/20 0553 11/04/20 0713  NA 139 139  K 3.5 3.3*  CL 100 99  CO2 25 24  GLUCOSE 144* 147*  BUN 127* 127*   CREATININE 6.94* 7.05*  CALCIUM 8.5* 8.3*   PT/INR No results for input(s): LABPROT, INR in the last 72 hours. CMP     Component Value Date/Time   NA 139 11/04/2020 0713   NA 142 07/21/2020 0921   K 3.3 (L) 11/04/2020 0713   CL 99 11/04/2020 0713   CO2 24 11/04/2020 0713   GLUCOSE 147 (H) 11/04/2020 0713   BUN 127 (H) 11/04/2020 0713   BUN 16 07/21/2020 0921   CREATININE 7.05 (H) 11/04/2020 0713   CREATININE 1.16 03/01/2016 1607   CALCIUM 8.3 (L) 11/04/2020 0713   PROT 6.4 (L) 11/03/2020 0519   PROT 6.7 07/21/2020 0921   ALBUMIN 1.3 (L) 11/04/2020 0713   ALBUMIN 4.1 07/21/2020 0921   AST 103 (H) 11/03/2020 0519   ALT 126 (H) 11/03/2020 0519   ALKPHOS 108 11/03/2020 0519   BILITOT 2.7 (H) 11/03/2020 0519   BILITOT 0.8 07/21/2020 0921   GFRNONAA 9 (L) 11/04/2020 0713   GFRNONAA >89 09/22/2015 1008   GFRAA 82 07/21/2020 0921   GFRAA >89 09/22/2015 1008   Lipase     Component Value Date/Time   LIPASE 23 08/22/2020 0318       Assessment/Plan - Acute decline in hgb overnight with no hemodynamic changes. Source is unclear, patient is widely drained with no change in character of drain  output. Patient does not seem to have active bleeding. Will transfuse another unit of PRBCs and check a post-transfusion CBC. Coags pending. - Wound vac removed, will leave off for now and place WTD dressings. - NPO, TPN, ice chips for comfort - PT/OT - Continue amio gtt. Discussed possibility of IV metoprolol at CIR yesterday with rehab coordinator. - Fentanyl patch - VTE: SQH on hold today due to drop in hgb - Dispo: ICU    LOS: 44 days    Michaelle Birks, MD [facility] Surgery General, Hepatobiliary and Pancreatic Surgery 11/05/20 7:40 AM

## 2020-11-05 NOTE — Progress Notes (Signed)
Dr. Bobbye Morton notified of Hgb of 6.2. Verbal orders to transfuse 1 unit of packed red bloods cells. Verbal order to hold morning 0600 heparin.

## 2020-11-05 NOTE — Progress Notes (Signed)
Inpatient Rehab Admissions Coordinator:   Pt. Is not medically ready for CIR admission at this time. I discussed his case with rehab MD who felt that while IC cardiac meds are not a definite barrier to CIR admission, but expressed concern that if Pt. Needs them on an ongoing basis, he may not be able to tolerate CIR level therapies and may benefit from a lower/slower intensity at [facility] vs SNF. Pt. Also cannot come to CIR on telemetry, so cardiology would need to feel comfortable with Pt. Doing intensive rehab while off telemetry. CIR will continue to follow to monitor for progress/tolerance with therapies and if he appears to be able to tolerate and does not need to remain on telemetry for rehab, we can consider him for potential CIR admission.   Clemens Catholic, [place], [place] Admissions Coordinator  [phone] ([place]) [phone] (office)

## 2020-11-06 DIAGNOSIS — I480 Paroxysmal atrial fibrillation: Secondary | ICD-10-CM | POA: Diagnosis not present

## 2020-11-06 LAB — CBC
HCT: 29 % — ABNORMAL LOW (ref 39.0–52.0)
Hemoglobin: 9.8 g/dL — ABNORMAL LOW (ref 13.0–17.0)
MCH: 27.9 pg (ref 26.0–34.0)
MCHC: 33.8 g/dL (ref 30.0–36.0)
MCV: 82.6 fL (ref 80.0–100.0)
Platelets: 232 10*3/uL (ref 150–400)
RBC: 3.51 MIL/uL — ABNORMAL LOW (ref 4.22–5.81)
RDW: 18.7 % — ABNORMAL HIGH (ref 11.5–15.5)
WBC: 9.7 10*3/uL (ref 4.0–10.5)
nRBC: 0 % (ref 0.0–0.2)

## 2020-11-06 LAB — BPAM RBC
Blood Product Expiration Date: 202205092359
Blood Product Expiration Date: 202205222359
ISSUE DATE / TIME: 202204270031
ISSUE DATE / TIME: 202204270753
Unit Type and Rh: 5100
Unit Type and Rh: 9500

## 2020-11-06 LAB — TYPE AND SCREEN
ABO/RH(D): O NEG
Antibody Screen: NEGATIVE
Unit division: 0
Unit division: 0

## 2020-11-06 LAB — COMPREHENSIVE METABOLIC PANEL
ALT: 164 U/L — ABNORMAL HIGH (ref 0–44)
AST: 143 U/L — ABNORMAL HIGH (ref 15–41)
Albumin: 1.2 g/dL — ABNORMAL LOW (ref 3.5–5.0)
Alkaline Phosphatase: 98 U/L (ref 38–126)
Anion gap: 15 (ref 5–15)
BUN: 117 mg/dL — ABNORMAL HIGH (ref 6–20)
CO2: 25 mmol/L (ref 22–32)
Calcium: 8.4 mg/dL — ABNORMAL LOW (ref 8.9–10.3)
Chloride: 96 mmol/L — ABNORMAL LOW (ref 98–111)
Creatinine, Ser: 6.36 mg/dL — ABNORMAL HIGH (ref 0.61–1.24)
GFR, Estimated: 10 mL/min — ABNORMAL LOW (ref >60)
Glucose, Bld: 139 mg/dL — ABNORMAL HIGH (ref 70–99)
Potassium: 3.1 mmol/L — ABNORMAL LOW (ref 3.5–5.1)
Sodium: 136 mmol/L (ref 135–145)
Total Bilirubin: 2.9 mg/dL — ABNORMAL HIGH (ref 0.3–1.2)
Total Protein: 6.5 g/dL (ref 6.5–8.1)

## 2020-11-06 LAB — GLUCOSE, CAPILLARY
Glucose-Capillary: 123 mg/dL — ABNORMAL HIGH (ref 70–99)
Glucose-Capillary: 133 mg/dL — ABNORMAL HIGH (ref 70–99)
Glucose-Capillary: 137 mg/dL — ABNORMAL HIGH (ref 70–99)
Glucose-Capillary: 142 mg/dL — ABNORMAL HIGH (ref 70–99)
Glucose-Capillary: 160 mg/dL — ABNORMAL HIGH (ref 70–99)

## 2020-11-06 LAB — PROTIME-INR
INR: 3.4 — ABNORMAL HIGH (ref 0.8–1.2)
INR: 1.3 — ABNORMAL HIGH (ref 0.8–1.2)
Prothrombin Time: 34.4 seconds — ABNORMAL HIGH (ref 11.4–15.2)
Prothrombin Time: 15.9 seconds — ABNORMAL HIGH (ref 11.4–15.2)

## 2020-11-06 LAB — TRIGLYCERIDES
Triglycerides: 1424 mg/dL — ABNORMAL HIGH (ref <150)
Triglycerides: 234 mg/dL — ABNORMAL HIGH (ref <150)

## 2020-11-06 LAB — MAGNESIUM: Magnesium: 1.9 mg/dL (ref 1.7–2.4)

## 2020-11-06 LAB — PHOSPHORUS: Phosphorus: 2.8 mg/dL (ref 2.5–4.6)

## 2020-11-06 MED ORDER — SODIUM CHLORIDE 0.9 % IV SOLN: 100.0000 mL | INTRAVENOUS | Status: DC | PRN

## 2020-11-06 MED ORDER — POTASSIUM CHLORIDE 10 MEQ/50ML IV SOLN
10.0000 meq | INTRAVENOUS | Status: AC
  Administered 2020-11-06 (×2): 10 meq via INTRAVENOUS
  Filled 2020-11-06 (×2): qty 50

## 2020-11-06 MED ORDER — LIDOCAINE HCL (PF) 1 % IJ SOLN: 5.0000 mL | INTRAMUSCULAR | Status: DC | PRN

## 2020-11-06 MED ORDER — FENTANYL CITRATE (PF) 100 MCG/2ML IJ SOLN
50.0000 ug | Freq: Once | INTRAMUSCULAR | Status: AC
  Administered 2020-11-06: 50 ug via INTRAVENOUS

## 2020-11-06 MED ORDER — VITAMIN K1 10 MG/ML IJ SOLN
10.0000 mg | Freq: Once | INTRAVENOUS | Status: AC
  Administered 2020-11-06: 10 mg via INTRAVENOUS
  Filled 2020-11-06 (×2): qty 1

## 2020-11-06 MED ORDER — ALTEPLASE 2 MG IJ SOLR: 2.0000 mg | Freq: Once | INTRAMUSCULAR | Status: DC | PRN

## 2020-11-06 MED ORDER — HEPARIN SODIUM (PORCINE) 1000 UNIT/ML DIALYSIS
1000.0000 [IU] | INTRAMUSCULAR | Status: DC | PRN
  Administered 2020-11-06: 2400 [IU] via INTRAVENOUS_CENTRAL
  Filled 2020-11-06 (×3): qty 1

## 2020-11-06 MED ORDER — SODIUM CHLORIDE 0.9 % IV SOLN
100.0000 mL | INTRAVENOUS | Status: DC | PRN
  Administered 2020-11-07 – 2020-11-08 (×2): 100 mL via INTRAVENOUS

## 2020-11-06 MED ORDER — FENTANYL 100 MCG/HR TD PT72
1.0000 | MEDICATED_PATCH | TRANSDERMAL | Status: DC
Start: 2020-11-06 — End: 2020-11-08
  Administered 2020-11-06: 1 via TRANSDERMAL
  Filled 2020-11-06: qty 1

## 2020-11-06 MED ORDER — PENTAFLUOROPROP-TETRAFLUOROETH EX AERO: 1.0000 "application " | INHALATION_SPRAY | CUTANEOUS | Status: DC | PRN

## 2020-11-06 MED ORDER — ZINC CHLORIDE 1 MG/ML IV SOLN
INTRAVENOUS | Status: AC
  Filled 2020-11-06: qty 1370.73

## 2020-11-06 MED ORDER — HEPARIN SODIUM (PORCINE) 1000 UNIT/ML DIALYSIS: 20.0000 [IU]/kg | INTRAMUSCULAR | Status: DC | PRN

## 2020-11-06 MED ORDER — LIDOCAINE-PRILOCAINE 2.5-2.5 % EX CREA
1.0000 "application " | TOPICAL_CREAM | CUTANEOUS | Status: DC | PRN
  Filled 2020-11-06: qty 5

## 2020-11-06 NOTE — Progress Notes (Signed)
20 Days Post-Op  Subjective: Hgb 10 yesterday afternoon after blood transfusion. INR corrected to 1.3 after vitamin K yesterday, but is up to 3.3 today. Remaining labs pending.  Objective: Vital signs in last 24 hours: Temp:  [98.3 F (36.8 C)-101.3 F (38.5 C)] 99 F (37.2 C) (04/28 0400) Pulse Rate:  [50-119] 50 (04/28 0733) Resp:  [12-28] 18 (04/28 0733) BP: (91-128)/(49-76) 106/63 (04/28 0733) SpO2:  [88 %-97 %] 96 % (04/28 0733) FiO2 (%):  [21 %-28 %] 28 % (04/28 0733) Weight:  [95.6 kg] 95.6 kg (04/28 0500) Last BM Date:  (pta)  Intake/Output from previous day: 04/27 0701 - 04/28 0700 In: 3873.7 [I.V.:3382.4; Blood:391.3; IV Piggyback:100] Out: 1805 [Urine:580; Drains:1225] Intake/Output this shift: No intake/output data recorded.  PE: General: resting comfortably, NAD Neuro: alert and oriented  HEENT: trach in place, site is clean Resp: normal work of breathing on trach collar CV: RRR Abdomen: soft, nondistended, midline wound has healthy clean granulation tissue, with a pinhole at the inferior aspect that is draining blood-tinged fluid. RUQ JP x1 with old blood and bile-tinged fluid. Biliary drain with bile. LUQ JP with scant old blood in drain. Pancreatic stent draining clear colorless fluid. G tube in place to gravity drainage, draining clear nonbloody gastric contents. Extremities: warm and well-perfused  Lab Results:  Recent Labs    11/04/20 0713 11/04/20 1500 11/05/20 0900 11/05/20 1459  WBC 9.5  --  9.9  --   HGB 8.2*   < > 10.1* 10.5*  HCT 25.5*   < > 31.0* 31.7*  PLT 161  --  182  --    < > = values in this interval not displayed.   BMET Recent Labs    11/04/20 0713 11/05/20 0711  NA 139 136  K 3.3* 3.3*  CL 99 99  CO2 24 27  GLUCOSE 147* 140*  BUN 127* 83*  CREATININE 7.05* 5.06*  CALCIUM 8.3* 8.2*   PT/INR Recent Labs    11/05/20 1835 11/06/20 0508  LABPROT 16.1* 34.4*  INR 1.3* 3.4*   CMP     Component Value Date/Time    NA 136 11/05/2020 0711   NA 142 07/21/2020 0921   K 3.3 (L) 11/05/2020 0711   CL 99 11/05/2020 0711   CO2 27 11/05/2020 0711   GLUCOSE 140 (H) 11/05/2020 0711   BUN 83 (H) 11/05/2020 0711   BUN 16 07/21/2020 0921   CREATININE 5.06 (H) 11/05/2020 0711   CREATININE 1.16 03/01/2016 1607   CALCIUM 8.2 (L) 11/05/2020 0711   PROT 6.4 (L) 11/03/2020 0519   PROT 6.7 07/21/2020 0921   ALBUMIN 1.3 (L) 11/05/2020 0711   ALBUMIN 4.1 07/21/2020 0921   AST 103 (H) 11/03/2020 0519   ALT 126 (H) 11/03/2020 0519   ALKPHOS 108 11/03/2020 0519   BILITOT 2.7 (H) 11/03/2020 0519   BILITOT 0.8 07/21/2020 0921   GFRNONAA 13 (L) 11/05/2020 0711   GFRNONAA >89 09/22/2015 1008   GFRAA 82 07/21/2020 0921   GFRAA >89 09/22/2015 1008   Lipase     Component Value Date/Time   LIPASE 23 08/22/2020 0318       Assessment/Plan - Coagulopathy: Likely related to nutrition. Patient has no enterohepatic circulation or enteric absorption of fat-soluble vitamins. Will give 10mg  IV vitamin K this morning. Patient currently has no signs of active bleeding. - Replace wound vac today - Increased drainage from midline wound - noncon CT abd/pelvis today to assess for undrained collections that may be  decompressing through the midline wound. - NPO, TPN, ice chips for comfort - PT/OT - Amio gtt for afib - Fentanyl patch - dose increased today due to frequent requirements for IV fentanyl for breakthrough pain - VTE: SQH on hold due to elevated INR - Dispo: ICU    LOS: 45 days    Michaelle Birks, MD [facility] Surgery General, Hepatobiliary and Pancreatic Surgery 11/06/20 7:59 AM

## 2020-11-06 NOTE — Progress Notes (Signed)
IP rehab admissions - not medically ready or able to tolerate CIR at this time.  Please see progress note from Clemens Catholic from 11/05/20.  Call for questions.  [phone]

## 2020-11-06 NOTE — Progress Notes (Signed)
[facility] Progress Note    Assessment/ Plan:   Pt is a 50 y.o. yo male with neuroendocrine tumor status post Whipple procedure right colectomy and SMV repair, developed AKI on dialysis.  1.Acute kidney injury likely ischemic ATN with hypotensive episode requiring Levophed in the setting of very complicated surgical history and multiple visits to the OR, DIC, diffuse small bowel necrosis.  Creatinine level is fluctuating represents compromised renal function with repeated insults.   He required CRRT from 4/9-10/2009.  Since then undergoing intermittent hemodialysis TTS schedule.  HD today. UOP only 580 cc yesterday. Continue to monitor for renal recovery.     2.Acute respiratory failure required mechanical ventilation. s/p trach on 4/8 that was decannulated for >48 hours then recannulated on 4/21. Pt tolerating trach collar.  3.Neuroendocrine tumor status post Whipple procedure with extensive small bowel necrosis status post resection.  Currently on TPN.  General surgery is following.  4.Atrial Fibrillation  NICM  systolic heart failure - on amiodarone, HD for to euvolemia; cardiology following.  5.Severe protein calorie malnutrition, hypoalbuminemia on TPN.  6.Hypokalemia: K 3.1 Managed with dialysis and TPN.  Repeat as needed.  7.Anemia: Hgb 10.2 yesterday. He recieved 2 units pRBCs this week. Transfuse as needed.   8. GOC - full code, full scope care previously established by palliative consult  Subjective:   HD today. No significant overnight events.    Objective:   BP 106/63   Pulse (!) 50   Temp 99 F (37.2 C) (Oral)   Resp 18   Ht 6\' 2"  (1.88 m)   Wt 95.6 kg   SpO2 96%   BMI 27.06 kg/m   Intake/Output Summary (Last 24 hours) at 11/06/2020 0835 Last data filed at 11/06/2020 0700 Gross per 24 hour  Intake 3873.68 ml  Output 1805 ml  Net 2068.68 ml   Weight change:   Physical Exam: GEN: ill appearing, resting in bed, in no acute distress   CV: regular rate and rhythm, right IJ RESP: no increased work of breathing, trach collar in place ABD: Bowel sounds present. Soft MSK: no appreciable LE edema SKIN: warm, dry  Imaging: No results found.  Labs: BMET Recent Labs  Lab 11/01/20 0900 11/02/20 0455 11/03/20 0519 11/04/20 0553 11/04/20 0713 11/05/20 0711 11/06/20 0651  NA 137 138 139 139 139 136 136  K 3.7 3.2* 3.0* 3.5 3.3* 3.3* 3.1*  CL 104 101 99 100 99 99 96*  CO2 20* 28 28 25 24 27 25   GLUCOSE 108* 135* 129* 144* 147* 140* 139*  BUN 110* 61* 94* 127* 127* 83* 117*  CREATININE 6.74* 4.31* 6.05* 6.94* 7.05* 5.06* 6.36*  CALCIUM 8.0* 8.1* 8.2* 8.5* 8.3* 8.2* 8.4*  PHOS 5.7* 2.4* 2.3* 2.9 2.8 3.0 2.8   CBC Recent Labs  Lab 11/01/20 0628 11/01/20 0900 11/03/20 0519 11/03/20 0838 11/04/20 0713 11/04/20 1500 11/04/20 2059 11/05/20 0540 11/05/20 0900 11/05/20 1459  WBC 8.0  --  8.3  --  9.5  --   --   --  9.9  --   NEUTROABS  --   --  5.9  --   --   --   --   --   --   --   HGB 7.1*   < > 7.9*   < > 8.2*   < > 5.8* 6.2* 10.1* 10.5*  HCT 25.2*   < > 24.6*   < > 25.5*   < > 22.6* 24.6* 31.0* 31.7*  MCV 100.8*  --  85.7  --  85.3  --   --   --  84.2  --   PLT 105*  --  143*  --  161  --   --   --  182  --    < > = values in this interval not displayed.    Medications:    . sodium chloride   Intravenous Once  . chlorhexidine  15 mL Mouth Rinse BID  . Chlorhexidine Gluconate Cloth  6 each Topical Q0600  . Chlorhexidine Gluconate Cloth  6 each Topical Q0600  . Chlorhexidine Gluconate Cloth  6 each Topical Q0600  . fentaNYL  1 patch Transdermal Q72H  . insulin aspart  0-20 Units Subcutaneous Q4H  . lidocaine  5 mL Intradermal Once  . mouth rinse  15 mL Mouth Rinse q12n4p  . pantoprazole (PROTONIX) IV  40 mg Intravenous BID  . sodium chloride flush  10-40 mL Intracatheter Q12H  . sodium chloride flush  5 mL Intracatheter Q8H      Saprina Chuong, DO 11/06/2020, 8:35 AM

## 2020-11-06 NOTE — Progress Notes (Signed)
Progress Note  Patient Name: [name] Date of Encounter: 11/06/2020  Primary Cardiologist: Cristopher Peru, MD  Subjective   NSR this am Nurse indicates frequent need for abdominal dressing changes   Inpatient Medications    Scheduled Meds: . sodium chloride   Intravenous Once  . chlorhexidine  15 mL Mouth Rinse BID  . Chlorhexidine Gluconate Cloth  6 each Topical Q0600  . Chlorhexidine Gluconate Cloth  6 each Topical Q0600  . Chlorhexidine Gluconate Cloth  6 each Topical Q0600  . fentaNYL  1 patch Transdermal Q72H  . insulin aspart  0-20 Units Subcutaneous Q4H  . lidocaine  5 mL Intradermal Once  . mouth rinse  15 mL Mouth Rinse q12n4p  . pantoprazole (PROTONIX) IV  40 mg Intravenous BID  . sodium chloride flush  10-40 mL Intracatheter Q12H  . sodium chloride flush  5 mL Intracatheter Q8H   Continuous Infusions: . sodium chloride 250 mL (10/23/20 0825)  . sodium chloride    . sodium chloride    . albumin human    . amiodarone 60 mg/hr (11/06/20 0742)  . phytonadione (VITAMIN K) IV    . promethazine (PHENERGAN) injection (IM or IVPB) Stopped (11/06/20 0017)  . TPN ADULT (ION) 110 mL/hr at 11/06/20 0700   PRN Meds: sodium chloride, sodium chloride, alteplase, fentaNYL (SUBLIMAZE) injection, ipratropium-albuterol, lidocaine (PF), lidocaine-prilocaine, ondansetron (ZOFRAN) IV, pentafluoroprop-tetrafluoroeth, polyvinyl alcohol, promethazine (PHENERGAN) injection (IM or IVPB), sodium chloride flush   Vital Signs    Vitals:   11/06/20 0600 11/06/20 0620 11/06/20 0700 11/06/20 0733  BP: 108/64  104/63 106/63  Pulse: 83 79 82 (!) 50  Resp: (!) 21 19 19 18   Temp:      TempSrc:      SpO2: 95% 95% 95% 96%  Weight:      Height:        Intake/Output Summary (Last 24 hours) at 11/06/2020 0802 Last data filed at 11/06/2020 0700 Gross per 24 hour  Intake 3873.68 ml  Output 1805 ml  Net 2068.68 ml   Last 3 Weights 11/06/2020 11/04/2020 10/31/2020  Weight (lbs) 210 lb  12.2 oz 214 lb 11.7 oz 214 lb 8.1 oz  Weight (kg) 95.6 kg 97.4 kg 97.3 kg     Telemetry    NSR with PVCs and short bursts of PAF- Personally Reviewed  Physical Exam   Ill black male Trach collar Lungs clear anteriorly Post abdominal surgery  TPN line right neck Plus one LE edema AICD under left clavicle  On dialysis    Labs    High Sensitivity Troponin:  No results for input(s): TROPONINIHS in the last 720 hours.    Cardiac EnzymesNo results for input(s): TROPONINI in the last 168 hours. No results for input(s): TROPIPOC in the last 168 hours.   Chemistry Recent Labs  Lab 11/01/20 0900 11/02/20 0455 11/03/20 0519 11/04/20 0553 11/04/20 0713 11/05/20 0711 11/06/20 0651  NA 137   < > 139   < > 139 136 136  K 3.7   < > 3.0*   < > 3.3* 3.3* 3.1*  CL 104   < > 99   < > 99 99 96*  CO2 20*   < > 28   < > 24 27 25   GLUCOSE 108*   < > 129*   < > 147* 140* 139*  BUN 110*   < > 94*   < > 127* 83* 117*  CREATININE 6.74*   < > 6.05*   < >  7.05* 5.06* 6.36*  CALCIUM 8.0*   < > 8.2*   < > 8.3* 8.2* 8.4*  PROT 6.1*  --  6.4*  --   --   --  6.5  ALBUMIN 1.2*   < > 1.2*   < > 1.3* 1.3* 1.2*  AST 116*  --  103*  --   --   --  143*  ALT 121*  --  126*  --   --   --  164*  ALKPHOS 87  --  108  --   --   --  98  BILITOT 2.8*  --  2.7*  --   --   --  2.9*  GFRNONAA 9*   < > 11*   < > 9* 13* 10*  ANIONGAP 13   < > 12   < > 16* 10 15   < > = values in this interval not displayed.     Hematology Recent Labs  Lab 11/03/20 0519 11/03/20 0838 11/04/20 0713 11/04/20 1500 11/05/20 0540 11/05/20 0900 11/05/20 1459  WBC 8.3  --  9.5  --   --  9.9  --   RBC 2.87*  --  2.99*  --   --  3.68*  --   HGB 7.9*   < > 8.2*   < > 6.2* 10.1* 10.5*  HCT 24.6*   < > 25.5*   < > 24.6* 31.0* 31.7*  MCV 85.7  --  85.3  --   --  84.2  --   MCH 27.5  --  27.4  --   --  27.4  --   MCHC 32.1  --  32.2  --   --  32.6  --   RDW 18.1*  --  18.7*  --   --  18.3*  --   PLT 143*  --  161  --   --  182   --    < > = values in this interval not displayed.    BNPNo results for input(s): BNP, PROBNP in the last 168 hours.   DDimer No results for input(s): DDIMER in the last 168 hours.   Radiology    No results found.  Cardiac Studies   2d echo 09/26/20 1. Left ventricular ejection fraction, by estimation, is 40 to 45%. The  left ventricle has mildly decreased function. The left ventricle  demonstrates global hypokinesis. Left ventricular diastolic parameters are  consistent with Grade I diastolic  dysfunction (impaired relaxation).  2. Right ventricular systolic function is normal. The right ventricular  size is normal. Tricuspid regurgitation signal is inadequate for assessing  PA pressure.  3. The mitral valve is grossly normal. Trivial mitral valve  regurgitation.  4. The aortic valve is grossly normal. Aortic valve regurgitation is not  visualized. No aortic stenosis is present.  5. The inferior vena cava is normal in size with greater than 50%  respiratory variability, suggesting right atrial pressure of 3 mmHg.   Comparison(s): A prior study was performed on 05/25/20. Prior images  reviewed side by side. LV function has decreased slightly.   2D echo 10/31/2020 IMPRESSIONS  1. Left ventricular ejection fraction, by estimation, is 50%. The left  ventricle has mildly decreased function. The left ventricle demonstrates  regional wall motion abnormalities with severe basal inferior and  inferoseptal hypokinesis. The left  ventricular internal cavity size was mildly dilated. Left ventricular  diastolic parameters are consistent with Grade II diastolic dysfunction  (pseudonormalization).  2. Right  ventricular systolic function is normal. The right ventricular  size is normal. Peak RV-RA gradient 31 mmHg.  3. Left atrial size was mildly dilated.  4. The mitral valve is normal in structure. Mild mitral valve  regurgitation. No evidence of mitral stenosis.  5. The  aortic valve is tricuspid. Aortic valve regurgitation is not  visualized. Mild aortic valve sclerosis is present, with no evidence of  aortic valve stenosis.  6. The IVC was not well-visualized.  Patient Profile     50 y.o. male with longstanding NICM, chronic systolic CHF, VT/VF with prior cardiac arrest 2011 (nonobstructive CAD by cath at that time - 20-30% ramus otherwise small vessels) s/p STJ ICD, HTN, HLD, neuroendocrine tumor. Underwent Whipple procedure 3/14 with intra-operative SMV laceration and repair by vascular with bovine pericardial patch. Complex hospital course since that time with worsening hemorrhagic shock and anemia, massive transfusion protocol, small bowel necrosis s/p resection, septic shock, tracheostomy, AKI, DIC. On 10/31/19 developed hypotension, AF RVR and 2 inappropriate ICD shocks for rapid atrial ib.  Assessment & Plan    1. Rapid atrial fib - inappropriate ICDx2 shock for this - continue amiodarone 60 mg/hr drip  2. NICM/chronic systolic CHF - repeat limited echo showed low normal LVF with EF 50% and basal inferior and inferoseptal HK - dialysis for volume control ongoing   3. History of VT/VF - outpatient f/u EP for EOL and battery concerns  - No VT on amiodarone   4. Neuroendocrine tumor s/p Whipple with complex hospital course - per primary teams - persistent hypomagnesemia/hypocalcemia noted - will defer electrolyte management to primary teams - K 3.3 improved replete   5.  Anemia -has gotten large load of blood product -Hbg 8.2 this am  -per primary team  I have spent a total of 30 minutes with patient reviewing 2D echo , telemetry, EKGs, labs and examining patient as well as establishing an assessment and plan that was discussed with the patient.  > 50% of time was spent in direct patient care.    For questions or updates, please contact [place] Please consult [email or web address] for contact info under Cardiology/STEMI.  Signed, Jenkins Rouge, MD 11/06/2020, 8:02 AM    Patient ID: [name], male   DOB: [birth year], 50 y.o.   MRN: [id number]

## 2020-11-06 NOTE — Progress Notes (Addendum)
PHARMACY - TOTAL PARENTERAL NUTRITION CONSULT NOTE  Indication:  Short bowel syndrome  Patient Measurements: (updated) Height: 6\' 2"  (188 cm) Weight: 97.4 kg (214 lb 11.7 oz) IBW/kg (Calculated) : 82.2 TPN AdjBW (KG): 95.3 Body mass index is 27.57 kg/m.  Assessment:  17 YOM with history of mass adjacent to head of the pancreas in 2021. 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  Developed cholecystitis in Feb 2022 and had percutaneous cholecystectomy tube placement.  Presented this admission for neuroendocrine tumor resection with Whipple procedure, also had right total colectomy, colostomy and cholecystectomy on 10/03/2020. SMV was lacerated during procedure and repaired by bovine pericardial patch. Developed hemorrhagic shock with bile leak post-op and underwent placement of biliary T tube with abdominal washout on 3/15 PM. Antimicrobial therapies discontinued 4/20. Pharmacy consulted to manage TPN.   Glucose / Insulin: no hx DM, A1c 5.2%. CBGs controlled. Utilized 18 units SSI in the past 24h hours, 55 units in TPN CV: BP back to wnl - pressors weaned off Electrolytes: K 3.1, Phos 2.8, CoCa ~10.5, Mag 1.9, others WNL, Renal: CRRT started 4/9 > 4/11, now on iHD TTS (4K baths), last 4/23 (3.5 hr BFR 400), next planned 4/26 Hepatic: LFTs mildly elevated, Tbili down 2.9, alb 1.2, prealbumin 14.8  - TG peaked at 740 (3/26) - last value 234 (4/28)  (Trig 4/27 AM >1400, drawn from TPN line whilst infusing, redraw 234)  Intake / Output; MIVF: UOP 0.6 ml/kg/hr, drains (5 total): 1240ml, net +5.6L since admit    GI Imaging: 4/6 CT - pelvic fluid collection concerning for abscess 4/12 CT - no new fluid collections GI Surgeries / Procedures: 3/14 Whipple procedure with R hemicolectomy and end ileostomy, cholecystectomy 3/15 washout, VAC placement 3/17 washout, VAC replacement.  Necrosis of entire small bowel except for the PB limb and proximal 40-50cm of jejunum 3/19  re-opening laparotomy, abd closure.  Necrotic SB. 3/21 SB resection, take down of choledochojejunal anastomosis and pancreaticojejunal anastomosis, take down of duodenojejunal anastomosis, placement of externalized biliary drain / Stamm gastrostomy tube, IA washout and placement of intraperitoneal drains 3/30 extubation 4/8 trach 4/12 left lateral abdominal drain placed by IR  Central access: R IJ CVC placed 3/15; changed to PICC placed 10/03/20 TPN start date: 09/26/2020  Nutritional Goals (RD rec on 4/21): 2800-3000 kCal, 185-210g AA, fluid >2L/day  --TPN will provide 2833 kcal for weekly average and 205g protein daily --on lipid days (MWF): 3120 kcal with 205g protein, 528g CHO, 50g SMOF lipid --on NON-lipid days (TThSaSun): 2618kcal, 528g CHO, 205g protein   Current Nutrition:  TPN  Plan:  Team asking if we can add vitamin K to TPN -- multivamin contains 149mcg + IV lipid also contains small amount. This meets daily recommended values   Cycle TPN over 18 hours at goal rate (34ml/hr > 150ml/hr > 70ml/hr) -continue to cycle as tolerated  SMOF lipid on MWF only - Electrolytes in TPN: Na 185mEq/L, K 27 mEq/L, Phos 5 mmol/L, continue with removed Ca, Mg 7 mEq/L, Cl:Ac 1:1 - Daily multivitamin in TPN. Remove standard trace elements and add back zinc 5mg  and selenium 56mcg. Remove chromium while on RRT.  - Discontinue sliding scale + 60 units insulin regular in TPN and adjust as needed, adjust CBG to q6h to reduce sticks (goal CBG 140-180)  - Changed triglyceride level to qMonday (no lipid in TPN on Sundays) -will check BMP, Mag, Phos tomorrow AM  Wilson Singer, PharmD PGY1 Pharmacy Resident 11/06/2020 7:28  AM

## 2020-11-07 ENCOUNTER — Inpatient Hospital Stay (HOSPITAL_COMMUNITY): Payer: Managed Care, Other (non HMO)

## 2020-11-07 ENCOUNTER — Ambulatory Visit: Payer: Managed Care, Other (non HMO) | Admitting: Nurse Practitioner

## 2020-11-07 DIAGNOSIS — D3A8 Other benign neuroendocrine tumors: Secondary | ICD-10-CM | POA: Diagnosis not present

## 2020-11-07 DIAGNOSIS — J9601 Acute respiratory failure with hypoxia: Secondary | ICD-10-CM | POA: Diagnosis not present

## 2020-11-07 DIAGNOSIS — N179 Acute kidney failure, unspecified: Secondary | ICD-10-CM | POA: Diagnosis not present

## 2020-11-07 HISTORY — PX: IR CATHETER TUBE CHANGE: IMG717

## 2020-11-07 LAB — RENAL FUNCTION PANEL
Albumin: 1.1 g/dL — ABNORMAL LOW (ref 3.5–5.0)
Anion gap: 11 (ref 5–15)
BUN: 61 mg/dL — ABNORMAL HIGH (ref 6–20)
CO2: 27 mmol/L (ref 22–32)
Calcium: 7.9 mg/dL — ABNORMAL LOW (ref 8.9–10.3)
Chloride: 98 mmol/L (ref 98–111)
Creatinine, Ser: 3.95 mg/dL — ABNORMAL HIGH (ref 0.61–1.24)
GFR, Estimated: 18 mL/min — ABNORMAL LOW (ref >60)
Glucose, Bld: 126 mg/dL — ABNORMAL HIGH (ref 70–99)
Phosphorus: 1.3 mg/dL — ABNORMAL LOW (ref 2.5–4.6)
Potassium: 3.3 mmol/L — ABNORMAL LOW (ref 3.5–5.1)
Sodium: 136 mmol/L (ref 135–145)

## 2020-11-07 LAB — CBC
HCT: 28.3 % — ABNORMAL LOW (ref 39.0–52.0)
Hemoglobin: 9.4 g/dL — ABNORMAL LOW (ref 13.0–17.0)
MCH: 26.9 pg (ref 26.0–34.0)
MCHC: 33.2 g/dL (ref 30.0–36.0)
MCV: 80.9 fL (ref 80.0–100.0)
Platelets: 189 10*3/uL (ref 150–400)
RBC: 3.5 MIL/uL — ABNORMAL LOW (ref 4.22–5.81)
RDW: 19 % — ABNORMAL HIGH (ref 11.5–15.5)
WBC: 9.2 10*3/uL (ref 4.0–10.5)
nRBC: 0 % (ref 0.0–0.2)

## 2020-11-07 LAB — PROTIME-INR
INR: 1.3 — ABNORMAL HIGH (ref 0.8–1.2)
Prothrombin Time: 16 seconds — ABNORMAL HIGH (ref 11.4–15.2)

## 2020-11-07 LAB — GLUCOSE, CAPILLARY
Glucose-Capillary: 177 mg/dL — ABNORMAL HIGH (ref 70–99)
Glucose-Capillary: 90 mg/dL (ref 70–99)
Glucose-Capillary: 78 mg/dL (ref 70–99)
Glucose-Capillary: 144 mg/dL — ABNORMAL HIGH (ref 70–99)

## 2020-11-07 LAB — MAGNESIUM: Magnesium: 1.9 mg/dL (ref 1.7–2.4)

## 2020-11-07 MED ORDER — ZINC CHLORIDE 1 MG/ML IV SOLN
INTRAVENOUS | Status: DC
  Filled 2020-11-07: qty 1370.72

## 2020-11-07 MED ORDER — ZINC CHLORIDE 1 MG/ML IV SOLN
INTRAVENOUS | Status: AC
  Filled 2020-11-07: qty 1370.73

## 2020-11-07 MED ORDER — SODIUM CHLORIDE 0.9 % IV BOLUS
500.0000 mL | Freq: Once | INTRAVENOUS | Status: AC
  Administered 2020-11-07: 500 mL via INTRAVENOUS

## 2020-11-07 MED ORDER — INSULIN ASPART 100 UNIT/ML IJ SOLN
0.0000 [IU] | INTRAMUSCULAR | Status: DC
  Administered 2020-11-07 – 2020-11-08 (×2): 1 [IU] via SUBCUTANEOUS
  Administered 2020-11-08: 2 [IU] via SUBCUTANEOUS
  Administered 2020-11-09: 1 [IU] via SUBCUTANEOUS
  Administered 2020-11-09 – 2020-11-11 (×3): 2 [IU] via SUBCUTANEOUS
  Administered 2020-11-11: 1 [IU] via SUBCUTANEOUS
  Administered 2020-11-11: 3 [IU] via SUBCUTANEOUS
  Administered 2020-11-11: 2 [IU] via SUBCUTANEOUS
  Administered 2020-11-12: 9 [IU] via SUBCUTANEOUS

## 2020-11-07 MED ORDER — ALBUMIN HUMAN 5 % IV SOLN
12.5000 g | Freq: Once | INTRAVENOUS | Status: AC
  Administered 2020-11-07: 12.5 g via INTRAVENOUS
  Filled 2020-11-07: qty 250

## 2020-11-07 MED ORDER — ZINC CHLORIDE 1 MG/ML IV SOLN: INTRAVENOUS | Status: DC

## 2020-11-07 MED ORDER — FENTANYL CITRATE (PF) 100 MCG/2ML IJ SOLN
INTRAMUSCULAR | Status: AC
  Filled 2020-11-07: qty 2

## 2020-11-07 MED ORDER — IOHEXOL 300 MG/ML  SOLN
50.0000 mL | Freq: Once | INTRAMUSCULAR | Status: AC | PRN
  Administered 2020-11-07: 20 mL

## 2020-11-07 MED ORDER — LIDOCAINE HCL 1 % IJ SOLN
INTRAMUSCULAR | Status: AC
  Filled 2020-11-07: qty 20

## 2020-11-07 MED ORDER — SODIUM PHOSPHATES 45 MMOLE/15ML IV SOLN
20.0000 mmol | Freq: Once | INTRAVENOUS | Status: AC
  Administered 2020-11-07: 20 mmol via INTRAVENOUS
  Filled 2020-11-07: qty 6.67

## 2020-11-07 MED ORDER — FENTANYL CITRATE (PF) 100 MCG/2ML IJ SOLN
INTRAMUSCULAR | Status: AC | PRN
  Administered 2020-11-07: 25 ug via INTRAVENOUS

## 2020-11-07 MED ORDER — HEPARIN SODIUM (PORCINE) 5000 UNIT/ML IJ SOLN
5000.0000 [IU] | Freq: Three times a day (TID) | INTRAMUSCULAR | Status: DC
  Administered 2020-11-07 – 2020-11-23 (×47): 5000 [IU] via SUBCUTANEOUS
  Filled 2020-11-07 (×43): qty 1

## 2020-11-07 NOTE — Progress Notes (Signed)
Patient ID: [name], male   DOB: [birth year], 50 y.o.   MRN: [id number] No change during day, due for drain exchange still which will hopefully clear IA infection

## 2020-11-07 NOTE — Progress Notes (Signed)
Referring Physician(s): Dr. Michaelle Birks  Supervising Physician: Arne Cleveland  Patient Status:  [facility] - In-pt  Chief Complaint: Neuroendocrine tumor  Subjective: Patient resting comfortably on trach collar.  Awakens to voice, but appears sleepy and only nods in agreement to my discussion today.  Intermittent hypotension today, 85/39 during visit.  LLQ drain remains in place.   Allergies: Ace inhibitors  Medications: Prior to Admission medications   Medication Sig Start Date End Date Taking? Authorizing Provider  acetaminophen (TYLENOL) 500 MG tablet Take 2 tablets (1,000 mg total) by mouth every 6 (six) hours. Patient taking differently: Take 1,000 mg by mouth every 6 (six) hours as needed for mild pain. 08/22/20  Yes [name], MD  allopurinol (ZYLOPRIM) 100 MG tablet Take one tablet by mouth daily for 1 week and then 2 tablets by mouth daily Patient taking differently: Take 200 mg by mouth daily. 07/03/20  Yes Swords, Darrick Penna, MD  amiodarone (PACERONE) 200 MG tablet Take 1 tablet (200 mg total) by mouth daily. Patient taking differently: Take 200 mg by mouth daily with supper. 07/30/20  Yes Evans Lance, MD  aspirin 81 MG tablet Take 1 tablet (81 mg total) by mouth 2 (two) times daily. Patient taking differently: Take 81 mg by mouth daily. 09/22/15  Yes Funches, Josalyn, MD  bisoprolol (ZEBETA) 10 MG tablet Take 1 tablet by mouth once daily Patient taking differently: Take 10 mg by mouth daily. 08/25/20  Yes Evans Lance, MD  budesonide-formoterol [facility]) 80-4.5 MCG/ACT inhaler Inhale 2 puffs into the lungs 2 (two) times daily. Must keep upcoming office visit for refills Patient taking differently: Inhale 2 puffs into the lungs 2 (two) times daily as needed (for flares). 11/09/19  Yes Fulp, Cammie, MD  sacubitril-valsartan (ENTRESTO) 24-26 MG Take 1 tablet by mouth 2 (two) times daily. 06/25/20  Yes Evans Lance, MD  amiodarone (PACERONE) 200 MG tablet TAKE  1 TABLET (200 MG TOTAL) BY MOUTH DAILY. 07/30/20 07/30/21  Evans Lance, MD  amiodarone (PACERONE) 200 MG tablet TAKE 1 TABLET (200 MG TOTAL) BY MOUTH DAILY. 11/15/19 11/14/20  Evans Lance, MD  bisoprolol (ZEBETA) 10 MG tablet TAKE 1 TABLET (10 MG TOTAL) BY MOUTH DAILY. 11/15/19 11/14/20  Evans Lance, MD  oxyCODONE (OXY IR/ROXICODONE) 5 MG immediate release tablet Take 1-2 tablets (5-10 mg total) by mouth every 6 (six) hours as needed for severe pain. Patient not taking: Reported on 09/10/2020 08/22/20   [name], MD     Vital Signs: BP (!) 85/49   Pulse 82   Temp 98.8 F (37.1 C) (Oral)   Resp (!) 21   Ht 6\' 2"  (1.88 m)   Wt 211 lb 10.3 oz (96 kg)   SpO2 95%   BMI 27.17 kg/m   Physical Exam  NAD, on trach collar with PMSV.  Abdomen: LLQ drain in place. Small amount of old, dark red appearing fluid.  Bulb to suction    Imaging: CT ABDOMEN PELVIS WO CONTRAST  Result Date: 11/07/2020 CLINICAL DATA:  Midline wound drainage a a EXAM: CT ABDOMEN AND PELVIS WITHOUT CONTRAST TECHNIQUE: Multidetector CT imaging of the abdomen and pelvis was performed following the standard protocol without IV contrast. COMPARISON:  10/21/2020 FINDINGS: Lower chest: Small left pleural effusion. Bibasilar atelectasis, right greater than left. Pacemaker leads seen within the right ventricle toward the apex. Mild global cardiomegaly. No pericardial effusion. Gynecomastia noted. Hepatobiliary: The gallbladder has been resected. A retrograde biliary drainage  CT extends from the common duct percutaneously through the right upper quadrant abdominal wall. No intra or extrahepatic biliary ductal dilation. The liver is unremarkable. Decreasing right subhepatic loculated fluid collection, now measuring 1.7 x 4.7 cm (previously measuring 2.6 x 6.0 cm). Pancreas: Resection of the pancreatic head has been performed with a percutaneous retrograde pancreatic drainage catheter seen extending from the main pancreatic duct  through the left upper quadrant abdominal wall. I do not clearly identify the pancreatico enteric anastomosis on this noncontrast examination. Spleen: Progressive splenomegaly with the spleen measuring 16.3 cm in greatest dimension. Adrenals/Urinary Tract: The adrenal glands are unremarkable. Asymmetric atrophy of the right kidney is unchanged. There is compensatory hypertrophy of the left kidney. No hydronephrosis. No intrarenal or ureteral calculi. The bladder is unremarkable. Stomach/Bowel: Balloon retention gastrostomy is seen within the distal stomach. The distal margin of the stomach appears stapled. I do not clearly identify the gastrojejunostomy. Surgical drainage catheters are again seen within the subhepatic space. Since the prior examination, a percutaneous drainage catheter has been placed via the left flank through the anterior pararenal space into the previously noted retroperitoneal hematoma. Much of the small bowel has been resected. No identifiable residual small-bowel is seen on this noncontrast examination. The distal transverse, descending, and rectosigmoid colon remain. Since the prior examination, there has developed extensive loculated gas and fluid within the a surgical bed of the duodenal resection. This may relate to enteric communication/enteric leak, flushing of the drainage catheters with incomplete drainage, or, less likely, gas-forming organism. There is new hyperdensity within this collection, best appreciated on image # 56, in keeping with a hematoma. This curvilinear collection is difficult to measure due to its configuration, but measures at least 4.1 x 16.0 cm at axial image # 49 within the a resection bed of the second and third portion of the duodenum and measures at least 2.6 x 9.1 cm antral laterally in the expected resection bed of the small bowel. Gas from the inferior aspect of this collection appears to extend into the midline incision and likely accounts for the reported  wound drainage. Separate loculated collection is seen within the deep pelvis measuring 4.3 x 4.7 cm at axial image # 81. No gross free intraperitoneal gas. Vascular/Lymphatic: The mesenteric venous structures are not well appreciated on this noncontrast examination. The abdominal vasculature is otherwise unremarkable on this noncontrast examination. Reproductive: Prostate is unremarkable. Other: Subcutaneous infiltration within the left lower quadrant abdominal wall likely relates to subcutaneous injection. There is mild subcutaneous edema involving the flanks most in keeping with mild anasarca. Musculoskeletal: No acute bone abnormality. IMPRESSION: Complex examination. Suspected near complete resection of the small bowel and right hemicolectomy with the distal colon remaining. Resection of the a duodenum, head of the pancreas, and gallbladder in keeping with Whipple resection. The pancreatico biliary enteric limb is not well appreciated on this noncontrast examination. Percutaneous drainage of the biliary and pancreatic effluent noted. Repeat imaging with contrast administration, if possible, would better delineate the enteric and vascular structures. Interval percutaneous drainage catheter placement within the retroperitoneal hematoma noted on prior examination within the left anterior pararenal space. Interval development of large complex loculated collection containing extensive interstitial gas and high attenuation material compatible with blood product within the retroperitoneum within the resection bed of the duodenum and small bowel. This may relate to enteric communication, catheter malfunction and incomplete drainage in combination with flushing of the drainage catheters, or, less likely, aggressive infection. This gas and fluid appears to communicate with  the laparotomy incision inferiorly. 4.7 cm deep pelvic loculated fluid collection stable from prior examination. Right subhepatic loculated fluid  collection appears slightly smaller in the interval. Electronically Signed   By: Fidela Salisbury MD   On: 11/07/2020 03:46    Labs:  CBC: Recent Labs    11/04/20 0713 11/04/20 1500 11/05/20 0900 11/05/20 1459 11/06/20 0734 11/07/20 0522  WBC 9.5  --  9.9  --  9.7 9.2  HGB 8.2*   < > 10.1* 10.5* 9.8* 9.4*  HCT 25.5*   < > 31.0* 31.7* 29.0* 28.3*  PLT 161  --  182  --  232 189   < > = values in this interval not displayed.    COAGS: Recent Labs    09/26/2020 1947 09/24/20 0400 09/24/20 1650 09/24/2020 1909 10/03/2020 1624 11/05/20 0656 11/05/20 1835 11/06/20 0508 11/06/20 1346 11/07/20 0522  INR 0.9   < > 1.4* 1.4*   < > 2.2* 1.3* 3.4* 1.3* 1.3*  APTT 35  --  32 39*  --  44*  --   --   --   --    < > = values in this interval not displayed.    BMP: Recent Labs    12/03/19 1703 05/24/20 1956 07/03/20 1643 07/21/20 0921 08/21/20 1037 11/04/20 0713 11/05/20 0711 11/06/20 0651 11/07/20 0522  NA 144   < > 142 142   < > 139 136 136 136  K 4.6   < > 3.7 4.2   < > 3.3* 3.3* 3.1* 3.3*  CL 109*   < > 103 104   < > 99 99 96* 98  CO2 21   < > 25 27   < > 24 27 25 27   GLUCOSE 103*   < > 105* 83   < > 147* 140* 139* 126*  BUN 15   < > 18 16   < > 127* 83* 117* 61*  CALCIUM 9.5   < > 9.1 9.5   < > 8.3* 8.2* 8.4* 7.9*  CREATININE 1.27   < > 0.96 1.19   < > 7.05* 5.06* 6.36* 3.95*  GFRNONAA 66   < > 92 71   < > 9* 13* 10* 18*  GFRAA 77  --  107 82  --   --   --   --   --    < > = values in this interval not displayed.    LIVER FUNCTION TESTS: Recent Labs    10/30/20 0508 10/31/20 0602 11/01/20 0900 11/02/20 0455 11/03/20 0519 11/04/20 0553 11/04/20 0713 11/05/20 0711 11/06/20 0651 11/07/20 0522  BILITOT 3.4*  --  2.8*  --  2.7*  --   --   --  2.9*  --   AST 105*  --  116*  --  103*  --   --   --  143*  --   ALT 129*  --  121*  --  126*  --   --   --  164*  --   ALKPHOS 93  --  87  --  108  --   --   --  98  --   PROT 7.9  --  6.1*  --  6.4*  --   --   --  6.5   --   ALBUMIN 1.3*   < > 1.2*   < > 1.2*   < > 1.3* 1.3* 1.2* 1.1*   < > = values in this interval not  displayed.    Assessment and Plan: Neuroendocrine tumor of pancreas s/p Whipple procedure with hemicolectomy in OR, complicated by hemorrhagic shock with DIC and small bowel necrosis s/p small bowel resection 09/25/2020. Intra-abdominal fluid collections requiring multiple drain placements with IR Patient currently has a LLQ drain in place which has drain small amounts of dark, bloody fluid daily.  Initially thought to be hematoma, however culture were positive for E. Coli.  Patient s/p repeat CT Abdomen Pelvis yesterday due to increased midline drainage. Imaging shows collection of fluid and gas that extends from midline.  The current LLQ drain is in place but positioned at the edge of the collection.  Discussed with Dr. Vernard Gambles, plan to exchange drain potentially with Thal.  Discussed with patient and brother at bedside.  Patient is NPO.   Risks and benefits discussed with the patient including bleeding, infection, damage to adjacent structures, bowel perforation/fistula connection, and sepsis.  All of the patient's questions were answered, patient is agreeable to proceed. Consent signed and in chart.  Electronically Signed: Docia Barrier, PA 11/07/2020, 1:35 PM   I spent a total of 15 Minutes at the the patient's bedside AND on the patient's hospital floor or unit, greater than 50% of which was counseling/coordinating care for intra-abdominal fluid collection.

## 2020-11-07 NOTE — Progress Notes (Signed)
PHARMACY - TOTAL PARENTERAL NUTRITION CONSULT NOTE  Indication:  Short bowel syndrome  Patient Measurements: (updated) Height: 6\' 2"  (188 cm) Weight: 97.4 kg (214 lb 11.7 oz) IBW/kg (Calculated) : 82.2 TPN AdjBW (KG): 95.3 Body mass index is 27.57 kg/m.  Assessment:  71 YOM with history of mass adjacent to head of the pancreas in 2021. 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  Developed cholecystitis in Feb 2022 and had percutaneous cholecystectomy tube placement.  Presented this admission for neuroendocrine tumor resection with Whipple procedure, also had right total colectomy, colostomy and cholecystectomy on 10/06/2020. SMV was lacerated during procedure and repaired by bovine pericardial patch. Developed hemorrhagic shock with bile leak post-op and underwent placement of biliary T tube with abdominal washout on 3/15 PM. Antimicrobial therapies discontinued 4/20. Pharmacy consulted to manage TPN.   Glucose / Insulin: no hx DM, A1c 5.2%. CBGs controlled. Utilized 3 units SSI in the past 24h (SSI now discontinued), 60 units in TPN CV: BP back to wnl Electrolytes: K 3.3, Phos 1.3, CoCa ~10.2, Mag 1.9, others WNL -received Kcl 16meq IV x2 on 4/28 AM  Renal: CRRT started 4/9 > 4/11, now on iHD TTS (4K baths) Hepatic: LFTs mildly elevated, Tbili down 2.9, alb 1.1, prealbumin 14.8  - TG peaked at 740 (3/26) - last value 234 (4/28)  (Trig 4/27 AM >1400, drawn from TPN line whilst infusing, redraw 234)  Intake / Output; MIVF: UOP 0.2 ml/kg/hr, drains (5 total): 1450ml, net +10.7L since admit    GI Imaging: 4/6 CT - pelvic fluid collection concerning for abscess 4/12 CT - no new fluid collections 4/28 CT - new fluid+gas collection in RLQ  GI Surgeries / Procedures: 3/14 Whipple procedure with R hemicolectomy and end ileostomy, cholecystectomy 3/15 washout, VAC placement 3/17 washout, VAC replacement.  Necrosis of entire small bowel except for the PB limb and  proximal 40-50cm of jejunum 3/19 re-opening laparotomy, abd closure.  Necrotic SB. 3/21 SB resection, take down of choledochojejunal anastomosis and pancreaticojejunal anastomosis, take down of duodenojejunal anastomosis, placement of externalized biliary drain / Stamm gastrostomy tube, IA washout and placement of intraperitoneal drains 3/30 extubation> 4/8 trach 4/12 left lateral abdominal drain placed by IR  Central access: R IJ CVC placed 3/15; changed to PICC placed 10/03/20 TPN start date: 09/26/20  Nutritional Goals (RD rec on 4/21): 2800-3000 kCal, 185-210g AA, fluid >2L/day  --TPN will provide 2833 kcal for weekly average and 205g protein daily --on lipid days (MWF): 3120 kcal with 205g protein, 528g CHO, 50g SMOF lipid --on NON-lipid days (TThSaSun): 2618kcal, 528g CHO, 205g protein   Current Nutrition:  TPN + NPO  Plan:  Cycle TPN over 18 hours at goal rate (75ml/hr > 163ml/hr > 1ml/hr) -continue to cycle as tolerated  SMOF lipid on MWF only  - Electrolytes in TPN: Na 146mEq/L, K 29 mEq/L, Phos 7 mmol/L, continue with removed Ca, Mg 8 mEq/L, Cl:Ac 1:1  -NaPhos 74mmol IV x1 today   - Daily multivitamin in TPN. Remove standard trace elements and add back zinc 5mg  and selenium 56mcg. Remove chromium while on RRT.  - Discontinue sliding scale + 60 units insulin regular in TPN and adjust as needed, adjust CBG check to cyclic TPN  -Custom sliding scale schedule 0200, 1300, 1500, 2000 to correspond with cycle times   - Changed triglyceride level to qMonday (no lipid in TPN on Sundays)   Wilson Singer, PharmD PGY1 Pharmacy Resident 11/07/2020 7:48 AM

## 2020-11-07 NOTE — Sedation Documentation (Signed)
Pt  tolerated procedure well.

## 2020-11-07 NOTE — Progress Notes (Signed)
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 s/p right hemicolectomy, end ileostomy, Whipple and patch angioplasty repair of SMV. postoperative hemorrhagic shock with DIC with extensive small bowel necrosis. All of small bowel has been resected, with external drainage of the bile duct, pancreas and stomach. Extubated 3/30. Reintubated 4/5. S/p tracheostomy 4/8. Started on CRRT 4/9. Started on trach collar trials 4/10.  Discontinued CRRT 4/11. PMH including AICD, heart failure, HTN, and dyslipidemia.    PT Comments    Pt continuing to make steady progress towards his goals, demonstrating improved activity tolerance and high motivation to participate and improve. Pt able to stand and ambulate x3 bouts with a RW short distances of ~6 ft with minAx2 this date. Pt continues to demonstrate lower extremity weakness and impaired reactional strategies that impact his balance and place him at risk for falls via his trunk sway with standing. Pt educated on proper hand placement with transitioning from sitting surface > RW with sit > stand transfers, good carryover. Will continue to follow acutely. Current recommendations remain appropriate.   Follow Up Recommendations  CIR     Equipment Recommendations  Wheelchair (measurements PT);Wheelchair cushion (measurements PT);Hospital bed;Rolling walker with 5" wheels;3in1 (PT)    Recommendations for Other Services       Precautions / Restrictions Precautions Precautions: Fall Precaution Comments: Trach collar; x2 JP drains at abdomen and x2 biliary drains; wound vac; low BP Restrictions Weight Bearing Restrictions: No    Mobility  Bed Mobility Overal bed mobility: Needs Assistance Bed Mobility: Supine to Sit;Sit to Supine     Supine to sit: Min assist Sit to  supine: Mod assist;+2 for safety/equipment;+2 for physical assistance   General bed mobility comments: Pt initiating leg management off EOB and needing minA to lift trunk to sit up. ModAx2 to manage legs and trunk for safety back to supine, cuing pt to descend trunk by leaning onto elbow.    Transfers Overall transfer level: Needs assistance Equipment used: Rolling walker (2 wheeled) Transfers: Sit to/from Stand Sit to Stand: Min assist;+2 physical assistance;+2 safety/equipment         General transfer comment: pt requires assist to steady, demonstrating trunk sway but no overt LOB. Sit to stand 3x from EOB. Cues for hand placement on bed rather than RW to come to stand.  Ambulation/Gait Ambulation/Gait assistance: Min assist;+2 physical assistance;+2 safety/equipment Gait Distance (Feet): 6 Feet (x3 bouts of ~6 ft each bout) Assistive device: Rolling walker (2 wheeled);None Gait Pattern/deviations: Decreased step length - right;Decreased step length - left;Trunk flexed;Narrow base of support;Decreased stride length;Shuffle Gait velocity: decreased Gait velocity interpretation: <1.31 ft/sec, indicative of household ambulator General Gait Details: Pt taking side steps to L and then a few steps anterior <> posterior with RW and minAx2 for steadying and management of lines, demonstrating trunk sway and poor feet clearance but no appreciative knee buckling. During one bout, pt attempted step anterior and posterior without UE support, increased trunk sway noted with posterior steps.   Stairs             Wheelchair Mobility    Modified Rankin (Stroke Patients Only)       Balance Overall balance assessment: Needs assistance Sitting-balance support: No upper extremity supported;Feet supported Sitting balance-[name]: Fair Sitting balance - Comments: able to maintain EOB sitting with supervision.  Maintains flexed posture   Standing balance support: Bilateral upper  extremity supported;During functional activity Standing balance-[name]: Poor Standing balance comment: requires bil. UE support and up to min A for static standing                            Cognition Arousal/Alertness: Awake/alert Behavior During Therapy: WFL for tasks assessed/performed Overall Cognitive Status: Difficult to assess                                 General Comments: Pt follows commands consistently.  He is reserved with minimal interactions but attempting to verbalize desires this date.      Exercises      General Comments General comments (skin integrity, edema, etc.): BP increased with activity, started off low with SBP in 90s, RN aware; 5L 28% FiO2 trach collar; SpO2 decreases to 80s% with activity and recovers to 90s% fairly quickly with sitting rest break      Pertinent Vitals/Pain Pain Assessment: 0-10 Pain Score: 5  Pain Location: abdomen Pain Descriptors / Indicators: Discomfort;Grimacing;Guarding Pain Intervention(s): Limited activity within patient's tolerance;Monitored during session;Repositioned;RN gave pain meds during session    Home Living                      Prior Function            PT Goals (current goals can now be found in the care plan section) Acute Rehab PT Goals Patient Stated Goal: agreeable to session, indicates desire to walk PT Goal Formulation: With patient Time For Goal Achievement: 11/10/20 Potential to Achieve Goals: Fair Progress towards PT goals: Progressing toward goals    Frequency    Min 3X/week      PT Plan Current plan remains appropriate    Co-evaluation              AM-PAC PT "6 Clicks" Mobility   Outcome Measure  Help needed turning from your back to your side while in a flat bed without using bedrails?: A Little Help needed moving from lying on your back to sitting on the side of a flat bed without using bedrails?: A Little Help needed moving to and from  a bed to a chair (including a wheelchair)?: A Little Help needed standing up from a chair using your arms (e.g., wheelchair or bedside chair)?: A Little Help needed to walk in hospital room?: A Lot Help needed climbing 3-5 steps with a railing? : Total 6 Click Score: 15    End of Session Equipment Utilized During Treatment: Gait belt;Oxygen Activity Tolerance: Patient tolerated treatment well Patient left: with call bell/phone within reach;with family/visitor present;in bed;with bed alarm set Nurse Communication: Mobility status;Patient requests pain meds (BP) PT Visit Diagnosis: Muscle weakness (generalized) (M62.81);Difficulty in walking, not elsewhere classified (R26.2);Pain;Unsteadiness on feet (R26.81);Other abnormalities of gait and mobility (R26.89) Pain - part of body:  (abdomen)     Time: 5361-4431 PT Time Calculation (min) (ACUTE ONLY): 27 min  Charges:  $Gait Training: 8-22 mins $Therapeutic Activity: 8-22 mins                     [name], PT, DPT Acute Rehabilitation Services  Pager: [phone] Office: [place] 11/07/2020, 2:20 PM

## 2020-11-07 NOTE — Progress Notes (Signed)
Notified Dr. Zenia Resides that BP still remains soft SBP 85-90, HR NSR 80s. Received new orders for 500 ml NS bolus and 5% Albumin.

## 2020-11-07 NOTE — Progress Notes (Addendum)
Dr. Zenia Resides in room. Made aware of patient's hypotension and drainage from wound vac site.

## 2020-11-07 NOTE — Sedation Documentation (Signed)
Pt resting comfortably at this time. VSS. MD at bedside

## 2020-11-07 NOTE — Progress Notes (Signed)
[name], MRN:  [id number], DOB:  June 26, 1971, LOS: 36 ADMISSION DATE:  09/14/2020, CONSULTATION DATE:  11/07/20 REFERRING MD:  Anesthesia, CHIEF COMPLAINT:  Whipple, post-op shock   Brief History:  50 y.o. M with PMH of neuroendocrine tumor and underwent Whipple procedure 3/14 with intra-operative SMV laceration and repair by vascular with bovine pericardial patch.  Pt had worsening shock overnight and required three pressors despite massive transfusion protocol (received 150 blood products).  He was taken back to the OR for washout on 3/15 and returned to the ICU intubated.  Taken back 3/17 again found to have small bowel necrosis s/p resection.  Went back for ex-lap on 3/19, noted to have necrosis of nearly entirety of bowel, no bowel resected.  Fascia closed.  Case was discussed with family and [facility] transplant surgery for consideration of eventual small bowel transplant.  Electively returned to the OR on 3/21 for resection of remaining necrotic small bowel removed, G-tube placed, JP drains in place (2). Tracheostomy ws performed by Dr. Bobbye Morton on 4/8.  Started CRRT for AKI on 4/9.    Past Medical History:  Neuroendocrine tumor of pancreas, resected 09/21/2020, negative surgical margins, lymph nodes negative Hypertension Chronic Systolic heart failure LVEF 35% in 2011, 2022 LVEF 50%, RVSP normal, LA dilated Non-ischemic cardioyopathy Hyperlipidemia V-fib with arrest, AICD placed in [place] Hospital Events:  3/14: Admit to Surgery, to OR for whipple, SMV injury and repair, massive transfusion protocol overnight  3/15: Back to OR for washout, x2. IR for arteriogram. No major bleed source identified in OR, or arterial bleed w IR. Robust product resuscitation >75 products (+ TXA + novoseven) 3/16: off pressors, add'l 39 products overnight. Slowing resuscitation this morning with hemodynamic improvements and slowing drain output; OR x 2 - washout, biliary t tube, wound vac --  washout, wound vac, IR for arteriogram -- no arterial bleed for embolization 3/17:  Aggressive balanced transfusion continue overnight with marked hemodynamic improvement since yesterday evening. Off pressors x several hours, Wound vac output significantly slowing (from 57ml q15-58min to 525ml q1hr+) and output is much thinner, Thin bloody secretions from mouth approx 273ml, Decreased RR from 22 to 18, Unable to tolerate NE- severe bradycardia 3/19 taken back to OR. Small bowel noted to be almost completely necrosed. Patient was closed with plans for goals of car discussion.  3/20 [place] family requests full scope of treatment. Primary team discussed with [facility] the possibility of transfer for small bowel transplant. [facility] felt as though transfer would not change outcome. 3/21 Ongoing discussion with family and [facility]. Patient may be a candidate for transplant if he can stabilize post a small bowel resection. He was taken back to the OR and small bowel was resected.  3/23 weaning pressors. Versed off. Remains encephalopathic 3/24 off pressors. Low dose dilaudid gtt -- only weakly grimaces to pain. Changing sedation to precedex + PRN fentanyl. Long discussion with 2 brothers regarding clinical case -- tried to clarify that while the term "stable" has been used, in this instance is meaning that he has not declined from previous shift but is in fact still critically ill with multisystem organ dysfunction.  3/25 Cr and BUN increased. Off sedation  3/26 Pressors off, CT head benign, BUN/Cr continue to creep up 3/27 Tachycardia/HTN overnight improved with fentanyl gtt 3/28 PCCM sign off. Trauma to take over vent/CCM needs.  3/30 extubated 4/2 desaturated overnight to the 30s improved with BVM and NTS.  4/5 poor airway protection. PCCM  consulted for re-intubation.  4/6 CT a/p Large RLL opacity, smaller LLL opacity. Post op changes. 5.4cm fluid collection in posterior pelvis, concerning for abscess. Started on Zosyn   4/7 got transfused overnight and then diuresed. Worse renal function 4/8 Tracheostomy performed by general surgery 4/9 transfused again. HD line inserted and started on CRRT 4/10 started on trach collar trials. 4/12 off CRRT and transitioned to IHD. Surgery placed abdominal drain with minimal output-- during procedure seemed c/w hematoma however since grew out klebsiella  4/13, 4/14 Tolerated dialysis 4/15 Placed on vent overnight for rest 4/16 Tolerating trach collar 4/17 got HD. Trach downsized to 6 cuffless, trach dislodged overnight, replaced with #6 cuffless shiley.  4/18 afebrile, trach collar + PMV. Up to chair with PT, PCCM signed off 4/21 decannulated; called back for hypotension, Afib/RVR, increased output from his 5 drains and wound vac.Transfused 2 units, re-cannulated, on Levo and Neo.  ICD fired x2, cardiology consulted 4/24 Continues to tolerate trach collar, 5L 28% 4/29 fever overnight, no complaints, remains on tracheostomy collar   Consults:  PCCM VVS  IR  Palliative Cardiology Nephrology  Procedures:  PICC 3/25 > JP x 2  Biliary drain Pancreatic stent drain ETT 2/24>>3/13; 3/15 >> 3/29;  4/5>>4/8 Trach 4/8> L lateral abdominal drain 4/12>  Significant Diagnostic Tests:  See radiology tab Most recent significant imaging:  4/12 CT abd/pelvis>>  Unchanged ill-defined fluid collections within the left side of the root of the abdominal mesentery, the ventral aspect of the abdominal mesentery, the subcapsular aspect of the right lobe of the liver and the pelvic cul-de-sac all of which are incompletely evaluated on this noncontrast examination though may represent areas of evolving hematomas giving imaging stability for the past week. Redemonstrated ill-defined stranding surrounding the remaining portions of the pancreas without definable/drainable fluid collection on this noncontrast examination.  Micro Data:  See micro tab Most recent significant findings:    4/5 trach asp >> Klebsiella pneumoniae 4/12 Pelvic abscess >> ampicillin resistant Klebsiella pneumoniae 4/12 BCx> no growth   Antimicrobials:  Zosyn 3/21 > 3/29; 4/5 >4/19 Eraxis 3/23> 3/29, 4/12>4/19  Interim History / Subjective:   4/29 fever overnight, no complaints, remains on tracheostomy collar    Objective   Blood pressure (!) 91/53, pulse 85, temperature 99.4 F (37.4 C), temperature source Oral, resp. rate (!) 29, height 6\' 2"  (1.88 m), weight 96 kg, SpO2 94 %.    FiO2 (%):  [21 %-28 %] 21 %   Intake/Output Summary (Last 24 hours) at 11/07/2020 2423 Last data filed at 11/07/2020 0700 Gross per 24 hour  Intake 4023.66 ml  Output 2395 ml  Net 1628.66 ml   Filed Weights   11/04/20 0700 11/06/20 0500 11/06/20 1530  Weight: 97.4 kg 95.6 kg 96 kg   Physical Exam:  General:  Resting comfortably in bed HENT: NCAT OP clear tracheostomy in place, dressing without significant drainage, scant tracheal secretions noted PULM: CTA B, normal effort CV: RRR, no mgr GI: midline wound vac in place, no surrounding erythema, multiple drains in place MSK: normal bulk and tone Neuro: awake, nods head to questions, moves all four extremities    Resolved Hospital Problem list   Hemorrhagic shock hypophosphatemia Septic shock  Klebsiella pneumoniae pneumonia Acute hypoxemic respiratory failure requiring mechanical ventilation  Assessment & Plan:   Acute respiratory failure with hypoxia > improved S/p Tracheostomy -- decannulation 4/21 but near immediate replacement in setting of shock/bleeding/atrial fibrillation; was briefly on mechanical vent support on 4/21, but back  to trach collar on 4/22 Continue tracheostomy collar per routine Maintain cuffed trach for now, consider changing to cuffless on 5/2  PAF  Systolic heart failure  ICD in place Tele IV amiodarone per cardiology F/u cardiology recommendations  Fever: 4/27>4/28; fluid collection changes from 4/29 CT  noted; ddx also includes other typical nosocomial sources though doesn't appear to have pneumonia as respiratory status has been stable Work up and management per primary service  Short gut syndrome TPN to continue  AKI now on HD HD per renal   Daily Goals Checklist  Per primary Code Status: Currently full code with continued aggressive management.   Goals of care: PCCM will continue to support the patient and primary service as best as possible.  Frankly, it is difficult to imagine him becoming a transplant candidate considering advanced cardiac disease without ability to use typical oral medications in addition to dialysis dependence.  Would consider re-consultation with [facility] transplant surgery (if there hasn't been a more recent conversation than that documented in March) and palliative care consultation.   Roselie Awkward, MD [place] PCCM Pager: [phone] Cell: [phone] If no response, please call [phone] until 7pm After 7:00 pm call Elink  [phone]   11/07/2020, 9:28 AM

## 2020-11-07 NOTE — Progress Notes (Signed)
Inpatient Rehab Admissions Coordinator:  Pt not medically ready for CIR at this time.  Will continue to follow progress with therapies and medical workup.  Gayland Curry, [place], [place] Admissions Coordinator [phone]

## 2020-11-07 NOTE — Progress Notes (Signed)
[facility] Progress Note    Assessment/ Plan:   Pt is a 50 y.o. yo male with neuroendocrine tumor status post Whipple procedure right colectomy and SMV repair, developed AKI on dialysis.  1.Acute kidney injury likely ischemic ATN with hypotensive episode requiring Levophed in the setting of very complicated surgical history and multiple visits to the OR, DIC, diffuse small bowel necrosis.    He required CRRT from 4/9-10/2009.  Since then undergoing intermittent hemodialysis TTS schedule. HD yesterday with 500cc UF. UOP has decreased with 410 cc and 1 unmeasured occurrence yesterday.  Creatine 3.95 today the lowest its been in the past 11 days. Continue to monitor for renal recovery.     2.Acute respiratory failure required mechanical ventilation. s/p trach on 4/8 that was decannulated for >48 hours then recannulated on 4/21. Pt tolerating trach collar.  3.Neuroendocrine tumor status post Whipple procedure with extensive small bowel necrosis status post resection.  Currently on TPN.  General surgery is following.  4.Atrial Fibrillation  NICM  systolic heart failure - on amiodarone, HD for to euvolemia; cardiology following.  5.Severe protein calorie malnutrition, hypoalbuminemia on TPN.  6.Hypokalemia: K 3.3  Managed with dialysis and TPN.  Repleat as needed.  7.Anemia: Hgb 9.4 today. He recieved 2 units pRBCs this week. Transfuse as needed.   8. GOC - full code, full scope care previously established by palliative consult  Subjective:   No significant overnight events.    Objective:   BP (!) 91/53   Pulse 83   Temp 99.4 F (37.4 C) (Oral)   Resp (!) 37   Ht 6\' 2"  (1.88 m)   Wt 96 kg   SpO2 95%   BMI 27.17 kg/m   Intake/Output Summary (Last 24 hours) at 11/07/2020 0800 Last data filed at 11/07/2020 0700 Gross per 24 hour  Intake 4133.63 ml  Output 2395 ml  Net 1738.63 ml   Weight change: 0.4 kg  Physical Exam: GEN: resting in bed in no acute  distress  CV: regular rate and rhythm, right IJ  RESP: no increased work of breathing, clear to ascultation bilaterally, trach collar present ABD: Bowel sounds present. Soft MSK: trace pedal edema SKIN: warm, dry    Imaging: CT ABDOMEN PELVIS WO CONTRAST  Result Date: 11/07/2020 CLINICAL DATA:  Midline wound drainage a a EXAM: CT ABDOMEN AND PELVIS WITHOUT CONTRAST TECHNIQUE: Multidetector CT imaging of the abdomen and pelvis was performed following the standard protocol without IV contrast. COMPARISON:  10/21/2020 FINDINGS: Lower chest: Small left pleural effusion. Bibasilar atelectasis, right greater than left. Pacemaker leads seen within the right ventricle toward the apex. Mild global cardiomegaly. No pericardial effusion. Gynecomastia noted. Hepatobiliary: The gallbladder has been resected. A retrograde biliary drainage CT extends from the common duct percutaneously through the right upper quadrant abdominal wall. No intra or extrahepatic biliary ductal dilation. The liver is unremarkable. Decreasing right subhepatic loculated fluid collection, now measuring 1.7 x 4.7 cm (previously measuring 2.6 x 6.0 cm). Pancreas: Resection of the pancreatic head has been performed with a percutaneous retrograde pancreatic drainage catheter seen extending from the main pancreatic duct through the left upper quadrant abdominal wall. I do not clearly identify the pancreatico enteric anastomosis on this noncontrast examination. Spleen: Progressive splenomegaly with the spleen measuring 16.3 cm in greatest dimension. Adrenals/Urinary Tract: The adrenal glands are unremarkable. Asymmetric atrophy of the right kidney is unchanged. There is compensatory hypertrophy of the left kidney. No hydronephrosis. No intrarenal or ureteral calculi. The bladder is unremarkable.  Stomach/Bowel: Balloon retention gastrostomy is seen within the distal stomach. The distal margin of the stomach appears stapled. I do not clearly identify  the gastrojejunostomy. Surgical drainage catheters are again seen within the subhepatic space. Since the prior examination, a percutaneous drainage catheter has been placed via the left flank through the anterior pararenal space into the previously noted retroperitoneal hematoma. Much of the small bowel has been resected. No identifiable residual small-bowel is seen on this noncontrast examination. The distal transverse, descending, and rectosigmoid colon remain. Since the prior examination, there has developed extensive loculated gas and fluid within the a surgical bed of the duodenal resection. This may relate to enteric communication/enteric leak, flushing of the drainage catheters with incomplete drainage, or, less likely, gas-forming organism. There is new hyperdensity within this collection, best appreciated on image # 56, in keeping with a hematoma. This curvilinear collection is difficult to measure due to its configuration, but measures at least 4.1 x 16.0 cm at axial image # 49 within the a resection bed of the second and third portion of the duodenum and measures at least 2.6 x 9.1 cm antral laterally in the expected resection bed of the small bowel. Gas from the inferior aspect of this collection appears to extend into the midline incision and likely accounts for the reported wound drainage. Separate loculated collection is seen within the deep pelvis measuring 4.3 x 4.7 cm at axial image # 81. No gross free intraperitoneal gas. Vascular/Lymphatic: The mesenteric venous structures are not well appreciated on this noncontrast examination. The abdominal vasculature is otherwise unremarkable on this noncontrast examination. Reproductive: Prostate is unremarkable. Other: Subcutaneous infiltration within the left lower quadrant abdominal wall likely relates to subcutaneous injection. There is mild subcutaneous edema involving the flanks most in keeping with mild anasarca. Musculoskeletal: No acute bone  abnormality. IMPRESSION: Complex examination. Suspected near complete resection of the small bowel and right hemicolectomy with the distal colon remaining. Resection of the a duodenum, head of the pancreas, and gallbladder in keeping with Whipple resection. The pancreatico biliary enteric limb is not well appreciated on this noncontrast examination. Percutaneous drainage of the biliary and pancreatic effluent noted. Repeat imaging with contrast administration, if possible, would better delineate the enteric and vascular structures. Interval percutaneous drainage catheter placement within the retroperitoneal hematoma noted on prior examination within the left anterior pararenal space. Interval development of large complex loculated collection containing extensive interstitial gas and high attenuation material compatible with blood product within the retroperitoneum within the resection bed of the duodenum and small bowel. This may relate to enteric communication, catheter malfunction and incomplete drainage in combination with flushing of the drainage catheters, or, less likely, aggressive infection. This gas and fluid appears to communicate with the laparotomy incision inferiorly. 4.7 cm deep pelvic loculated fluid collection stable from prior examination. Right subhepatic loculated fluid collection appears slightly smaller in the interval. Electronically Signed   By: Fidela Salisbury MD   On: 11/07/2020 03:46    Labs: BMET Recent Labs  Lab 11/02/20 1027 11/03/20 2536 11/04/20 6440 11/04/20 3474 11/05/20 0711 11/06/20 0651 11/07/20 0522  NA 138 139 139 139 136 136 136  K 3.2* 3.0* 3.5 3.3* 3.3* 3.1* 3.3*  CL 101 99 100 99 99 96* 98  CO2 28 28 25 24 27 25 27   GLUCOSE 135* 129* 144* 147* 140* 139* 126*  BUN 61* 94* 127* 127* 83* 117* 61*  CREATININE 4.31* 6.05* 6.94* 7.05* 5.06* 6.36* 3.95*  CALCIUM 8.1* 8.2* 8.5* 8.3*  8.2* 8.4* 7.9*  PHOS 2.4* 2.3* 2.9 2.8 3.0 2.8 1.3*   CBC Recent Labs  Lab  11/03/20 0519 11/03/20 0838 11/04/20 0713 11/04/20 1500 11/05/20 0900 11/05/20 1459 11/06/20 0734 11/07/20 0522  WBC 8.3  --  9.5  --  9.9  --  9.7 9.2  NEUTROABS 5.9  --   --   --   --   --   --   --   HGB 7.9*   < > 8.2*   < > 10.1* 10.5* 9.8* 9.4*  HCT 24.6*   < > 25.5*   < > 31.0* 31.7* 29.0* 28.3*  MCV 85.7  --  85.3  --  84.2  --  82.6 80.9  PLT 143*  --  161  --  182  --  232 189   < > = values in this interval not displayed.    Medications:    . chlorhexidine  15 mL Mouth Rinse BID  . Chlorhexidine Gluconate Cloth  6 each Topical Q0600  . Chlorhexidine Gluconate Cloth  6 each Topical Q0600  . Chlorhexidine Gluconate Cloth  6 each Topical Q0600  . fentaNYL  1 patch Transdermal Q72H  . lidocaine  5 mL Intradermal Once  . mouth rinse  15 mL Mouth Rinse q12n4p  . pantoprazole (PROTONIX) IV  40 mg Intravenous BID  . sodium chloride flush  10-40 mL Intracatheter Q12H  . sodium chloride flush  5 mL Intracatheter Q8H      Lariza Cothron, DO 11/07/2020, 8:00 AM

## 2020-11-07 NOTE — Progress Notes (Signed)
21 Days Post-Op  Subjective: BP soft in 90s overnight while patient sleeping. Febrile to 38.6 at midnight. CT scan shows a collection of primarily gas and some fluid in the RLQ, which tracks toward the midline incision. Repeat INR yesterday was normal, first set of labs yesterday likely erroneous.  Objective: Vital signs in last 24 hours: Temp:  [97.6 F (36.4 C)-101.4 F (38.6 C)] 99.4 F (37.4 C) (04/29 0350) Pulse Rate:  [50-100] 85 (04/29 0600) Resp:  [0-29] 25 (04/29 0600) BP: (83-115)/(48-78) 92/52 (04/29 0630) SpO2:  [93 %-100 %] 96 % (04/29 0600) FiO2 (%):  [28 %] 28 % (04/29 0308) Weight:  [96 kg] 96 kg (04/28 1530) Last BM Date:  (pta)  Intake/Output from previous day: 04/28 0701 - 04/29 0700 In: 4055.2 [I.V.:3855.4; IV Piggyback:199.8] Out: 2395 [Urine:410; Drains:1485] Intake/Output this shift: Total I/O In: 2267.2 [I.V.:2217.3; IV Piggyback:49.9] Out: 1430 [Urine:95; Drains:835; Other:500]  PE: General: resting comfortably, NAD Neuro: alert and oriented  HEENT: trach in place, site is clean Resp: normal work of breathing on trach collar CV: RRR Abdomen: soft, nondistended, vac in place on midline wound draining blood-tinged fluid. RUQ JP x1 with old blood and bile-tinged fluid. Biliary drain with bile. LUQ JP with scant old blood in drain. Pancreatic stent draining clear colorless fluid. G tube in place to gravity drainage, draining clear nonbloody gastric contents. Extremities: warm and well-perfused  Lab Results:  Recent Labs    11/06/20 0734 11/07/20 0522  WBC 9.7 9.2  HGB 9.8* 9.4*  HCT 29.0* 28.3*  PLT 232 189   BMET Recent Labs    11/06/20 0651 11/07/20 0522  NA 136 136  K 3.1* 3.3*  CL 96* 98  CO2 25 27  GLUCOSE 139* 126*  BUN 117* 61*  CREATININE 6.36* 3.95*  CALCIUM 8.4* 7.9*   PT/INR Recent Labs    11/06/20 1346 11/07/20 0522  LABPROT 15.9* 16.0*  INR 1.3* 1.3*   CMP     Component Value Date/Time   NA 136 11/07/2020  0522   NA 142 07/21/2020 0921   K 3.3 (L) 11/07/2020 0522   CL 98 11/07/2020 0522   CO2 27 11/07/2020 0522   GLUCOSE 126 (H) 11/07/2020 0522   BUN 61 (H) 11/07/2020 0522   BUN 16 07/21/2020 0921   CREATININE 3.95 (H) 11/07/2020 0522   CREATININE 1.16 03/01/2016 1607   CALCIUM 7.9 (L) 11/07/2020 0522   PROT 6.5 11/06/2020 0651   PROT 6.7 07/21/2020 0921   ALBUMIN 1.1 (L) 11/07/2020 0522   ALBUMIN 4.1 07/21/2020 0921   AST 143 (H) 11/06/2020 0651   ALT 164 (H) 11/06/2020 0651   ALKPHOS 98 11/06/2020 0651   BILITOT 2.9 (H) 11/06/2020 0651   BILITOT 0.8 07/21/2020 0921   GFRNONAA 18 (L) 11/07/2020 0522   GFRNONAA >89 09/22/2015 1008   GFRAA 82 07/21/2020 0921   GFRAA >89 09/22/2015 1008   Lipase     Component Value Date/Time   LIPASE 23 08/22/2020 0318       Assessment/Plan - Febrile overnight so will need to defer tunneled line placement. There is a new gas/fluid collection in the RLQ that was not present on prior imaging. Patient has no small bowel so the gas is not related to an enteric leak, could be from adjacent drains. This collection appears to communicate with his midline incision, which has drained 200-350ml per day for the last several days. This fluid is primarily old hematoma and leakage of bile and  pancreatic fluid around the stents. The previously placed pigtail is within the collection but has not drained for several days. IR consult placed to discuss drainage of this collection, but will need to defer tunneled line placement since the patient was febrile overnight. - NPO, TPN, ice chips for comfort - PT/OT - Amio gtt for afib - Fentanyl patch for pain, with prn IV fentanyl for breakthrough pain - VTE: INR normal, resume SQH after procedure - Dispo: ICU   LOS: 46 days    Michaelle Birks, MD [facility] Surgery General, Hepatobiliary and Pancreatic Surgery 11/07/20 6:43 AM

## 2020-11-07 NOTE — Sedation Documentation (Signed)
VSS, ICU RN at bedside to transport back to 4N with pt

## 2020-11-08 LAB — CBC
HCT: 28.3 % — ABNORMAL LOW (ref 39.0–52.0)
Hemoglobin: 9.1 g/dL — ABNORMAL LOW (ref 13.0–17.0)
MCH: 26.6 pg (ref 26.0–34.0)
MCHC: 32.2 g/dL (ref 30.0–36.0)
MCV: 82.7 fL (ref 80.0–100.0)
Platelets: 201 10*3/uL (ref 150–400)
RBC: 3.42 MIL/uL — ABNORMAL LOW (ref 4.22–5.81)
RDW: 19.7 % — ABNORMAL HIGH (ref 11.5–15.5)
WBC: 8.8 10*3/uL (ref 4.0–10.5)
nRBC: 0 % (ref 0.0–0.2)

## 2020-11-08 LAB — RENAL FUNCTION PANEL
Albumin: 1.2 g/dL — ABNORMAL LOW (ref 3.5–5.0)
Anion gap: 16 — ABNORMAL HIGH (ref 5–15)
BUN: 109 mg/dL — ABNORMAL HIGH (ref 6–20)
CO2: 23 mmol/L (ref 22–32)
Calcium: 8 mg/dL — ABNORMAL LOW (ref 8.9–10.3)
Chloride: 99 mmol/L (ref 98–111)
Creatinine, Ser: 5.63 mg/dL — ABNORMAL HIGH (ref 0.61–1.24)
GFR, Estimated: 12 mL/min — ABNORMAL LOW (ref >60)
Glucose, Bld: 171 mg/dL — ABNORMAL HIGH (ref 70–99)
Phosphorus: 2.5 mg/dL (ref 2.5–4.6)
Potassium: 3.9 mmol/L (ref 3.5–5.1)
Sodium: 138 mmol/L (ref 135–145)

## 2020-11-08 LAB — PROTIME-INR
INR: 1.3 — ABNORMAL HIGH (ref 0.8–1.2)
Prothrombin Time: 16 seconds — ABNORMAL HIGH (ref 11.4–15.2)

## 2020-11-08 LAB — GLUCOSE, CAPILLARY
Glucose-Capillary: 160 mg/dL — ABNORMAL HIGH (ref 70–99)
Glucose-Capillary: 119 mg/dL — ABNORMAL HIGH (ref 70–99)
Glucose-Capillary: 91 mg/dL (ref 70–99)
Glucose-Capillary: 131 mg/dL — ABNORMAL HIGH (ref 70–99)

## 2020-11-08 MED ORDER — ZINC CHLORIDE 1 MG/ML IV SOLN
INTRAVENOUS | Status: AC
  Filled 2020-11-08: qty 1370.72

## 2020-11-08 MED ORDER — FENTANYL 100 MCG/HR TD PT72
1.0000 | MEDICATED_PATCH | TRANSDERMAL | Status: DC
  Administered 2020-11-08 – 2020-11-11 (×2): 1 via TRANSDERMAL
  Filled 2020-11-08 (×2): qty 1

## 2020-11-08 MED ORDER — DIPHENHYDRAMINE HCL 50 MG/ML IJ SOLN
50.0000 mg | Freq: Once | INTRAMUSCULAR | Status: AC
  Administered 2020-11-08: 50 mg via INTRAVENOUS
  Filled 2020-11-08: qty 1

## 2020-11-08 MED ORDER — ALBUMIN HUMAN 5 % IV SOLN
12.5000 g | Freq: Once | INTRAVENOUS | Status: AC
  Administered 2020-11-08: 12.5 g via INTRAVENOUS
  Filled 2020-11-08: qty 250

## 2020-11-08 MED ORDER — MIDODRINE HCL 5 MG PO TABS: 10.0000 mg | ORAL_TABLET | Freq: Three times a day (TID) | ORAL | Status: DC

## 2020-11-08 MED ORDER — CHLORHEXIDINE GLUCONATE CLOTH 2 % EX PADS
6.0000 | MEDICATED_PAD | Freq: Every day | CUTANEOUS | Status: DC
  Administered 2020-11-08 – 2020-11-23 (×16): 6 via TOPICAL

## 2020-11-08 NOTE — Progress Notes (Signed)
22 Days Post-Op   Subjective/Chief Complaint: IR changed IA drain yesterday, temp to 100.2 last 24 hours, bp still low but has been that way for some time and not really responsive to much, awake, alert follows commands  Objective: Vital signs in last 24 hours: Temp:  [98.3 F (36.8 C)-100.2 F (37.9 C)] 99.7 F (37.6 C) (04/30 1200) Pulse Rate:  [79-89] 84 (04/30 1200) Resp:  [12-29] 21 (04/30 1200) BP: (80-97)/(44-62) 87/45 (04/30 1200) SpO2:  [90 %-100 %] 100 % (04/30 1200) FiO2 (%):  [28 %] 28 % (04/30 1110) Last BM Date:  (pta)  Intake/Output from previous day: 04/29 0701 - 04/30 0700 In: 3843.8 [I.V.:3299.1; IV Piggyback:544.8] Out: 3976 [Urine:175; Drains:1615] Intake/Output this shift: Total I/O In: 1101 [I.V.:1101] Out: 370 [Drains:370]  General: NAD Neuro:follows commands HEENT: trach in place, site is clean Resp: clear, on trach collar CV: RRR Abdomen: soft, nondistended, vac in place on midline  RUQ drain with minimal output Biliary drain with bile. LUQ JP with scant old blood in drain. G tube in place to gravity drainage Extremities: cr<2   Lab Results:  Recent Labs    11/07/20 0522 11/08/20 0559  WBC 9.2 8.8  HGB 9.4* 9.1*  HCT 28.3* 28.3*  PLT 189 201   BMET Recent Labs    11/07/20 0522 11/08/20 0559  NA 136 138  K 3.3* 3.9  CL 98 99  CO2 27 23  GLUCOSE 126* 171*  BUN 61* 109*  CREATININE 3.95* 5.63*  CALCIUM 7.9* 8.0*   PT/INR Recent Labs    11/07/20 0522 11/08/20 0559  LABPROT 16.0* 16.0*  INR 1.3* 1.3*   ABG No results for input(s): PHART, HCO3 in the last 72 hours.  Invalid input(s): PCO2, PO2  Studies/Results: CT ABDOMEN PELVIS WO CONTRAST  Result Date: 11/07/2020 CLINICAL DATA:  Midline wound drainage a a EXAM: CT ABDOMEN AND PELVIS WITHOUT CONTRAST TECHNIQUE: Multidetector CT imaging of the abdomen and pelvis was performed following the standard protocol without IV contrast. COMPARISON:  10/21/2020 FINDINGS: Lower  chest: Small left pleural effusion. Bibasilar atelectasis, right greater than left. Pacemaker leads seen within the right ventricle toward the apex. Mild global cardiomegaly. No pericardial effusion. Gynecomastia noted. Hepatobiliary: The gallbladder has been resected. A retrograde biliary drainage CT extends from the common duct percutaneously through the right upper quadrant abdominal wall. No intra or extrahepatic biliary ductal dilation. The liver is unremarkable. Decreasing right subhepatic loculated fluid collection, now measuring 1.7 x 4.7 cm (previously measuring 2.6 x 6.0 cm). Pancreas: Resection of the pancreatic head has been performed with a percutaneous retrograde pancreatic drainage catheter seen extending from the main pancreatic duct through the left upper quadrant abdominal wall. I do not clearly identify the pancreatico enteric anastomosis on this noncontrast examination. Spleen: Progressive splenomegaly with the spleen measuring 16.3 cm in greatest dimension. Adrenals/Urinary Tract: The adrenal glands are unremarkable. Asymmetric atrophy of the right kidney is unchanged. There is compensatory hypertrophy of the left kidney. No hydronephrosis. No intrarenal or ureteral calculi. The bladder is unremarkable. Stomach/Bowel: Balloon retention gastrostomy is seen within the distal stomach. The distal margin of the stomach appears stapled. I do not clearly identify the gastrojejunostomy. Surgical drainage catheters are again seen within the subhepatic space. Since the prior examination, a percutaneous drainage catheter has been placed via the left flank through the anterior pararenal space into the previously noted retroperitoneal hematoma. Much of the small bowel has been resected. No identifiable residual small-bowel is seen on this noncontrast examination.  The distal transverse, descending, and rectosigmoid colon remain. Since the prior examination, there has developed extensive loculated gas and  fluid within the a surgical bed of the duodenal resection. This may relate to enteric communication/enteric leak, flushing of the drainage catheters with incomplete drainage, or, less likely, gas-forming organism. There is new hyperdensity within this collection, best appreciated on image # 56, in keeping with a hematoma. This curvilinear collection is difficult to measure due to its configuration, but measures at least 4.1 x 16.0 cm at axial image # 49 within the a resection bed of the second and third portion of the duodenum and measures at least 2.6 x 9.1 cm antral laterally in the expected resection bed of the small bowel. Gas from the inferior aspect of this collection appears to extend into the midline incision and likely accounts for the reported wound drainage. Separate loculated collection is seen within the deep pelvis measuring 4.3 x 4.7 cm at axial image # 81. No gross free intraperitoneal gas. Vascular/Lymphatic: The mesenteric venous structures are not well appreciated on this noncontrast examination. The abdominal vasculature is otherwise unremarkable on this noncontrast examination. Reproductive: Prostate is unremarkable. Other: Subcutaneous infiltration within the left lower quadrant abdominal wall likely relates to subcutaneous injection. There is mild subcutaneous edema involving the flanks most in keeping with mild anasarca. Musculoskeletal: No acute bone abnormality. IMPRESSION: Complex examination. Suspected near complete resection of the small bowel and right hemicolectomy with the distal colon remaining. Resection of the a duodenum, head of the pancreas, and gallbladder in keeping with Whipple resection. The pancreatico biliary enteric limb is not well appreciated on this noncontrast examination. Percutaneous drainage of the biliary and pancreatic effluent noted. Repeat imaging with contrast administration, if possible, would better delineate the enteric and vascular structures. Interval  percutaneous drainage catheter placement within the retroperitoneal hematoma noted on prior examination within the left anterior pararenal space. Interval development of large complex loculated collection containing extensive interstitial gas and high attenuation material compatible with blood product within the retroperitoneum within the resection bed of the duodenum and small bowel. This may relate to enteric communication, catheter malfunction and incomplete drainage in combination with flushing of the drainage catheters, or, less likely, aggressive infection. This gas and fluid appears to communicate with the laparotomy incision inferiorly. 4.7 cm deep pelvic loculated fluid collection stable from prior examination. Right subhepatic loculated fluid collection appears slightly smaller in the interval. Electronically Signed   By: Fidela Salisbury MD   On: 11/07/2020 03:46    Anti-infectives: Anti-infectives (From admission, onward)   Start     Dose/Rate Route Frequency Ordered Stop   10/26/20 1000  piperacillin-tazobactam (ZOSYN) IVPB 2.25 g  Status:  Discontinued        2.25 g 100 mL/hr over 30 Minutes Intravenous Every 12 hours 10/25/20 2233 10/26/20 0715   10/26/20 0815  piperacillin-tazobactam (ZOSYN) IVPB 2.25 g  Status:  Discontinued        2.25 g 100 mL/hr over 30 Minutes Intravenous Every 8 hours 10/26/20 0715 10/29/20 0647   10/22/20 0930  anidulafungin (ERAXIS) 100 mg in sodium chloride 0.9 % 100 mL IVPB  Status:  Discontinued       "Followed by" Linked Group Details   100 mg 78 mL/hr over 100 Minutes Intravenous Every 24 hours 10/21/20 0840 10/29/20 0647   10/21/20 2200  piperacillin-tazobactam (ZOSYN) IVPB 2.25 g  Status:  Discontinued        2.25 g 100 mL/hr over 30 Minutes Intravenous Every  8 hours 10/21/20 1410 10/25/20 2233   10/21/20 1400  piperacillin-tazobactam (ZOSYN) IVPB 2.25 g  Status:  Discontinued        2.25 g 100 mL/hr over 30 Minutes Intravenous Every 8 hours 10/21/20  0742 10/21/20 1410   10/21/20 0930  anidulafungin (ERAXIS) 200 mg in sodium chloride 0.9 % 200 mL IVPB       "Followed by" Linked Group Details   200 mg 78 mL/hr over 200 Minutes Intravenous  Once 10/21/20 0840 10/21/20 1411   10/19/20 1600  piperacillin-tazobactam (ZOSYN) IVPB 3.375 g  Status:  Discontinued        3.375 g 100 mL/hr over 30 Minutes Intravenous Every 6 hours 10/19/20 1048 10/21/20 0742   10/18/20 1600  piperacillin-tazobactam (ZOSYN) IVPB 3.375 g  Status:  Discontinued        3.375 g 100 mL/hr over 30 Minutes Intravenous Every 6 hours 10/18/20 1123 10/19/20 1048   10/15/20 0400  piperacillin-tazobactam (ZOSYN) IVPB 3.375 g  Status:  Discontinued        3.375 g 12.5 mL/hr over 240 Minutes Intravenous Every 8 hours 10/14/20 2107 10/18/20 1123   10/14/20 2200  piperacillin-tazobactam (ZOSYN) IVPB 3.375 g        3.375 g 100 mL/hr over 30 Minutes Intravenous  Once 10/14/20 2107 10/14/20 2238   10/01/20 1100  anidulafungin (ERAXIS) 100 mg in sodium chloride 0.9 % 100 mL IVPB  Status:  Discontinued       "Followed by" Linked Group Details   100 mg 78 mL/hr over 100 Minutes Intravenous Every 24 hours 09/30/20 1014 10/08/20 0951   09/30/20 1100  anidulafungin (ERAXIS) 200 mg in sodium chloride 0.9 % 200 mL IVPB       "Followed by" Linked Group Details   200 mg 78 mL/hr over 200 Minutes Intravenous  Once 09/30/20 1014 09/30/20 1516   09/15/2020 1600  vancomycin (VANCOREADY) IVPB 1000 mg/200 mL  Status:  Discontinued        1,000 mg 200 mL/hr over 60 Minutes Intravenous Every 24 hours 09/20/2020 1137 10/07/2020 1510   09/09/2020 1400  piperacillin-tazobactam (ZOSYN) IVPB 3.375 g  Status:  Discontinued        3.375 g 12.5 mL/hr over 240 Minutes Intravenous Every 8 hours 09/14/2020 1322 10/08/20 0952   09/15/2020 1430  vancomycin (VANCOREADY) IVPB 1750 mg/350 mL  Status:  Discontinued        1,750 mg 175 mL/hr over 120 Minutes Intravenous Every 24 hours 10/07/2020 1349 09/30/2020 1137   09/26/20  1130  vancomycin (VANCOREADY) IVPB 2000 mg/400 mL        2,000 mg 200 mL/hr over 120 Minutes Intravenous  Once 09/26/20 1031 09/26/20 1411   09/26/20 1030  vancomycin variable dose per unstable renal function (pharmacist dosing)  Status:  Discontinued         Does not apply See admin instructions 09/26/20 1031 09/28/20 0816   10/06/2020 2200  metroNIDAZOLE (FLAGYL) IVPB 500 mg  Status:  Discontinued        500 mg 100 mL/hr over 60 Minutes Intravenous Every 8 hours 09/24/2020 1820 09/14/2020 1322   09/21/2020 2000  cefTRIAXone (ROCEPHIN) 2 g in sodium chloride 0.9 % 100 mL IVPB  Status:  Discontinued        2 g 200 mL/hr over 30 Minutes Intravenous Every 24 hours 09/15/2020 1820 09/19/2020 1322   10/09/2020 1330  vancomycin (VANCOCIN) 2,250 mg in sodium chloride 0.9 % 500 mL IVPB  Status:  Discontinued  2,250 mg 250 mL/hr over 120 Minutes Intravenous Every 48 hours 09/14/2020 1228 09/21/2020 1320   09/24/2020 1300  cefTRIAXone (ROCEPHIN) 2 g in sodium chloride 0.9 % 100 mL IVPB  Status:  Discontinued        2 g 200 mL/hr over 30 Minutes Intravenous Every 24 hours 09/28/2020 1208 09/20/2020 1320   09/13/2020 1300  metroNIDAZOLE (FLAGYL) IVPB 500 mg  Status:  Discontinued        500 mg 100 mL/hr over 60 Minutes Intravenous Every 8 hours 09/24/2020 1208 09/11/2020 1320   09/16/2020 0915  metroNIDAZOLE (FLAGYL) IVPB 500 mg  Status:  Discontinued        500 mg 100 mL/hr over 60 Minutes Intravenous To Surgery 09/18/2020 0903 09/18/2020 0951   10/08/2020 0630  ceFAZolin (ANCEF) IVPB 2g/100 mL premix       "And" Linked Group Details   2 g 200 mL/hr over 30 Minutes Intravenous On call to O.R. 10/04/2020 [id number] 10/06/2020 1215   09/23/2020 0630  metroNIDAZOLE (FLAGYL) IVPB 500 mg       "And" Linked Group Details   500 mg 100 mL/hr over 60 Minutes Intravenous On call to O.R. 09/10/2020 [id number] 09/24/2020 0820      Assessment/Plan:  -Hold on tunneled hd catheter, drain exchanged will follow Febrile overnight so will need to defer tunneled  line placement.  Vac put out less overnight, will need to follow after drain exchange. T - NPO, TPN, ice chips for comfort - PT/OT - Amio gtt for afib, cards following - Fentanyl patch for pain, with prn IV fentanyl for breakthrough pain - VTE: sqh Rolm Bookbinder 11/08/2020

## 2020-11-08 NOTE — Progress Notes (Addendum)
[name] NEPHROLOGY PROGRESS NOTE  Assessment/ Plan: Pt is a 50 y.o. yo male with neuroendocrine tumor status post Whipple procedure right colectomy and SMV repair, developed AKI on dialysis.  #Acute kidney injury likely ischemic ATN with hypotensive episode requiring Levophed in the setting of very complicated surgical history and multiple visits to the OR, DIC, diffuse small bowel necrosis.  Creatinine level is fluctuating represents compromised renal function with repeated insults.  No evidence of recovery so far.  He required CRRT from 4/9-10/2009.  Since then undergoing intermittent hemodialysis TTS schedule.  He has increased drain output and volume status looks okay however both BUN and creatinine level climbing of.  No sign of renal recovery so far.  Plan for dialysis today.  Blood pressure is low therefore order IV albumin and lower dialysate temperature. NPO so unable to order midodrine. Hopefully he will be able to tolerate dialysis.  #Acute respiratory failure required mechanical ventilation. s/p trach on 4/8.  #Neuroendocrine tumor status post Whipple procedure with extensive small bowel necrosis status post resection.  Currently on TPN.  General surgery is following.  #Severe protein calorie malnutrition, hypoalbuminemia on TPN.  #Hypokalemia: Managed with dialysis and TPN.    #Anemia: Hemoglobin stable.  Transfuse PRBC as needed.  #Hypotension: Monitor BP, asymptomatic, he is npo and on TPN so unable to receive midodrine.   D/w RN and pharmacist.   Subjective: Seen and examined.  Patient has trach, he is able to follow commands.  Urine output is recorded only 175 cc however he has drain output of 1.6 L.   Objective Vital signs in last 24 hours: Vitals:   11/08/20 0700 11/08/20 0800 11/08/20 0810 11/08/20 0900  BP: (!) 86/44 (!) 87/50  (!) 89/48  Pulse: 85 85 86 84  Resp: (!) 25 (!) 23 (!) 25 (!) 23  Temp:  100 F (37.8 C)    TempSrc:  Oral    SpO2:  100% 100% 100% 100%  Weight:      Height:       Weight change:   Intake/Output Summary (Last 24 hours) at 11/08/2020 0944 Last data filed at 11/08/2020 0600 Gross per 24 hour  Intake 3467.23 ml  Output 1790 ml  Net 1677.23 ml       Labs: Basic Metabolic Panel: Recent Labs  Lab 11/06/20 0651 11/07/20 0522 11/08/20 0559  NA 136 136 138  K 3.1* 3.3* 3.9  CL 96* 98 99  CO2 25 27 23   GLUCOSE 139* 126* 171*  BUN 117* 61* 109*  CREATININE 6.36* 3.95* 5.63*  CALCIUM 8.4* 7.9* 8.0*  PHOS 2.8 1.3* 2.5   Liver Function Tests: Recent Labs  Lab 11/03/20 0519 11/04/20 0553 11/06/20 0651 11/07/20 0522 11/08/20 0559  AST 103*  --  143*  --   --   ALT 126*  --  164*  --   --   ALKPHOS 108  --  98  --   --   BILITOT 2.7*  --  2.9*  --   --   PROT 6.4*  --  6.5  --   --   ALBUMIN 1.2*   < > 1.2* 1.1* 1.2*   < > = values in this interval not displayed.   No results for input(s): LIPASE, AMYLASE in the last 168 hours. No results for input(s): AMMONIA in the last 168 hours. CBC: Recent Labs  Lab 11/03/20 0519 11/03/20 0838 11/04/20 0713 11/04/20 1500 11/05/20 0900 11/05/20 1459 11/06/20 0734 11/07/20 0522 11/08/20  0559  WBC 8.3  --  9.5  --  9.9  --  9.7 9.2 8.8  NEUTROABS 5.9  --   --   --   --   --   --   --   --   HGB 7.9*   < > 8.2*   < > 10.1*   < > 9.8* 9.4* 9.1*  HCT 24.6*   < > 25.5*   < > 31.0*   < > 29.0* 28.3* 28.3*  MCV 85.7  --  85.3  --  84.2  --  82.6 80.9 82.7  PLT 143*  --  161  --  182  --  232 189 201   < > = values in this interval not displayed.   Cardiac Enzymes: No results for input(s): CKTOTAL, CKMB, CKMBINDEX, TROPONINI in the last 168 hours. CBG: Recent Labs  Lab 11/07/20 1146 11/07/20 1410 11/07/20 1554 11/07/20 1928 11/08/20 0248  GLUCAP 177* 90 78 144* 160*    Iron Studies: No results for input(s): IRON, TIBC, TRANSFERRIN, FERRITIN in the last 72 hours. Studies/Results: CT ABDOMEN PELVIS WO CONTRAST  Result Date:  11/07/2020 CLINICAL DATA:  Midline wound drainage a a EXAM: CT ABDOMEN AND PELVIS WITHOUT CONTRAST TECHNIQUE: Multidetector CT imaging of the abdomen and pelvis was performed following the standard protocol without IV contrast. COMPARISON:  10/21/2020 FINDINGS: Lower chest: Small left pleural effusion. Bibasilar atelectasis, right greater than left. Pacemaker leads seen within the right ventricle toward the apex. Mild global cardiomegaly. No pericardial effusion. Gynecomastia noted. Hepatobiliary: The gallbladder has been resected. A retrograde biliary drainage CT extends from the common duct percutaneously through the right upper quadrant abdominal wall. No intra or extrahepatic biliary ductal dilation. The liver is unremarkable. Decreasing right subhepatic loculated fluid collection, now measuring 1.7 x 4.7 cm (previously measuring 2.6 x 6.0 cm). Pancreas: Resection of the pancreatic head has been performed with a percutaneous retrograde pancreatic drainage catheter seen extending from the main pancreatic duct through the left upper quadrant abdominal wall. I do not clearly identify the pancreatico enteric anastomosis on this noncontrast examination. Spleen: Progressive splenomegaly with the spleen measuring 16.3 cm in greatest dimension. Adrenals/Urinary Tract: The adrenal glands are unremarkable. Asymmetric atrophy of the right kidney is unchanged. There is compensatory hypertrophy of the left kidney. No hydronephrosis. No intrarenal or ureteral calculi. The bladder is unremarkable. Stomach/Bowel: Balloon retention gastrostomy is seen within the distal stomach. The distal margin of the stomach appears stapled. I do not clearly identify the gastrojejunostomy. Surgical drainage catheters are again seen within the subhepatic space. Since the prior examination, a percutaneous drainage catheter has been placed via the left flank through the anterior pararenal space into the previously noted retroperitoneal hematoma.  Much of the small bowel has been resected. No identifiable residual small-bowel is seen on this noncontrast examination. The distal transverse, descending, and rectosigmoid colon remain. Since the prior examination, there has developed extensive loculated gas and fluid within the a surgical bed of the duodenal resection. This may relate to enteric communication/enteric leak, flushing of the drainage catheters with incomplete drainage, or, less likely, gas-forming organism. There is new hyperdensity within this collection, best appreciated on image # 56, in keeping with a hematoma. This curvilinear collection is difficult to measure due to its configuration, but measures at least 4.1 x 16.0 cm at axial image # 49 within the a resection bed of the second and third portion of the duodenum and measures at least 2.6 x 9.1 cm  antral laterally in the expected resection bed of the small bowel. Gas from the inferior aspect of this collection appears to extend into the midline incision and likely accounts for the reported wound drainage. Separate loculated collection is seen within the deep pelvis measuring 4.3 x 4.7 cm at axial image # 81. No gross free intraperitoneal gas. Vascular/Lymphatic: The mesenteric venous structures are not well appreciated on this noncontrast examination. The abdominal vasculature is otherwise unremarkable on this noncontrast examination. Reproductive: Prostate is unremarkable. Other: Subcutaneous infiltration within the left lower quadrant abdominal wall likely relates to subcutaneous injection. There is mild subcutaneous edema involving the flanks most in keeping with mild anasarca. Musculoskeletal: No acute bone abnormality. IMPRESSION: Complex examination. Suspected near complete resection of the small bowel and right hemicolectomy with the distal colon remaining. Resection of the a duodenum, head of the pancreas, and gallbladder in keeping with Whipple resection. The pancreatico biliary  enteric limb is not well appreciated on this noncontrast examination. Percutaneous drainage of the biliary and pancreatic effluent noted. Repeat imaging with contrast administration, if possible, would better delineate the enteric and vascular structures. Interval percutaneous drainage catheter placement within the retroperitoneal hematoma noted on prior examination within the left anterior pararenal space. Interval development of large complex loculated collection containing extensive interstitial gas and high attenuation material compatible with blood product within the retroperitoneum within the resection bed of the duodenum and small bowel. This may relate to enteric communication, catheter malfunction and incomplete drainage in combination with flushing of the drainage catheters, or, less likely, aggressive infection. This gas and fluid appears to communicate with the laparotomy incision inferiorly. 4.7 cm deep pelvic loculated fluid collection stable from prior examination. Right subhepatic loculated fluid collection appears slightly smaller in the interval. Electronically Signed   By: Fidela Salisbury MD   On: 11/07/2020 03:46    Medications: Infusions: . sodium chloride 250 mL (10/23/20 0825)  . sodium chloride Stopped (11/08/20 0549)  . sodium chloride    . sodium chloride Stopped (11/07/20 0546)  . sodium chloride    . amiodarone 60 mg/hr (11/08/20 0742)  . promethazine (PHENERGAN) injection (IM or IVPB) Stopped (11/07/20 2357)  . TPN CYCLIC-ADULT (ION) Stopped (11/08/20 0549)  . TPN CYCLIC-ADULT (ION)      Scheduled Medications: . chlorhexidine  15 mL Mouth Rinse BID  . Chlorhexidine Gluconate Cloth  6 each Topical Q0600  . Chlorhexidine Gluconate Cloth  6 each Topical Q0600  . Chlorhexidine Gluconate Cloth  6 each Topical Q0600  . Chlorhexidine Gluconate Cloth  6 each Topical Q0600  . fentaNYL  1 patch Transdermal Q72H  . heparin injection (subcutaneous)  5,000 Units Subcutaneous Q8H   . insulin aspart  0-9 Units Subcutaneous 4 times per day  . lidocaine  5 mL Intradermal Once  . mouth rinse  15 mL Mouth Rinse q12n4p  . pantoprazole (PROTONIX) IV  40 mg Intravenous BID  . sodium chloride flush  10-40 mL Intracatheter Q12H  . sodium chloride flush  5 mL Intracatheter Q8H    have reviewed scheduled and prn medications.  Physical Exam: General: Lying in bed, tracheostomy Heart:RRR, s1s2 nl Lungs: Clear anteriorly Abdomen:soft, non-distended Extremities: Trace LE edema Dialysis Access: IJ HD catheter in place.  Kayde Atkerson Prasad Veida Spira 11/08/2020,9:44 AM  LOS: 47 days

## 2020-11-08 NOTE — Progress Notes (Signed)
   Appears to be maintaining sinus rhythm -rate in the 80's. Continue IV amiodarone at 60 mg/hr - cardiology will be available over the weekend as needed.  Call with questions.  Pixie Casino, MD, [facility], [place] Director of the Advanced Lipid Disorders &  Cardiovascular Risk Reduction Clinic Diplomate of the American Board of Clinical Lipidology Attending Cardiologist  Direct Dial: [phone]  Fax: [phone]  Website:  [email or web address]

## 2020-11-08 NOTE — Progress Notes (Signed)
Notified Dr. Donne Hazel regarding a continued low BP, currently SBP in 70s. Received new orders for albumin and clarification to notify MD if MAP<60.

## 2020-11-08 NOTE — Progress Notes (Signed)
PHARMACY - TOTAL PARENTERAL NUTRITION CONSULT NOTE  Indication:  Short bowel syndrome  Patient Measurements: (updated) Height: 6\' 2"  (188 cm) Weight: 97.4 kg (214 lb 11.7 oz) IBW/kg (Calculated) : 82.2 TPN AdjBW (KG): 95.3 Body mass index is 27.57 kg/m.  Assessment:  47 YOM with history of mass adjacent to head of the pancreas in 2021. 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  Developed cholecystitis in Feb 2022 and had percutaneous cholecystectomy tube placement.  Presented this admission for neuroendocrine tumor resection with Whipple procedure, also had right total colectomy, colostomy and cholecystectomy on 09/10/2020. SMV was lacerated during procedure and repaired by bovine pericardial patch. Developed hemorrhagic shock with bile leak post-op and underwent placement of biliary T tube with abdominal washout on 3/15 PM. Antimicrobial therapies discontinued 4/20. Pharmacy consulted to manage TPN.   Glucose / Insulin: no hx DM, A1c 5.2%. CBGs 160-170s on TPN, 70-120s off TPN.  Utilized 1 unit sSSI in the past 24 hours and 60 units in TPN Electrolytes: all WNL (Phos up to 2.5 post NaPhos 30mmol) Renal: CRRT started 4/9 > 4/11, now on iHD TTS (4K baths) Hepatic: LFTs mildly elevated, Tbili down 2.9, alb 1.1, prealbumin 14.8  TG peaked at 740 (3/26) - last value 234 (4/28)  Intake / Output; MIVF: UOP 0.1 ml/kg/hr, drains (5 total): 1652ml, net +19.2L GI Imaging: 4/6 CT - pelvic fluid collection concerning for abscess 4/12 CT - no new fluid collections 4/28 CT - new fluid+gas collection in RLQ GI Surgeries / Procedures: 3/14 Whipple procedure with R hemicolectomy and end ileostomy, cholecystectomy 3/15 washout, VAC placement 3/17 washout, VAC replacement.  Necrosis of entire small bowel except for the PB limb and proximal 40-50cm of jejunum 3/19 re-opening laparotomy, abd closure.  Necrotic SB. 3/21 SB resection, take down of choledochojejunal anastomosis and  pancreaticojejunal anastomosis, take down of duodenojejunal anastomosis, placement of externalized biliary drain / Stamm gastrostomy tube, IA washout and placement of intraperitoneal drains 3/30 extubation> 4/8 trach 4/12 left lateral abdominal drain placed by IR  Central access: R IJ CVC placed 3/15; changed to PICC placed 10/03/20 TPN start date: 09/28/2020  Nutritional Goals (RD rec on 4/21): 2800-3000 kCal, 185-210g AA, fluid >2L/day -on lipid days (MWF): 3120 kcal with 205g protein, 528g CHO, 50g SMOF lipid -on NON-lipid days (TThSaSun): 2618kcal, 528g CHO, 205g protein   Current Nutrition:  TPN  Plan:  - Continue cyclic TPN, infuse 5638 mls over 18 hrs: 78 ml/hr x1 hr, 155 ml/hr x 16 hrs, 78 ml/hr x 1hr - Continue 19g/L SMOFlipid on MWF due to elevated TG - TPN provides a weekly avg of 2833 kCal and 205g AA per day, meeting 100% of needs - Electrolytes in TPN: Na 171mEq/L, K 27mEq/L, incr Phos 37mmol/L, no Ca, Mg 10mEq/L, Cl:Ac 1:1 - Daily multivitamin in TPN. Remove standard trace elements and add back zinc 5mg  and selenium 18mcg. Remove chromium while on RRT. - Custom sensitive SSI schedule 0200, 1300, 1500, 2000 to correspond with cycle times  - Continue 60 units insulin regular in TPN  - Standard TPN labs on Mon/Thurs - Monitor hemodynamic stability, may need to transition back to 24-hr TPN infusion - Clinisol on national shortage, transition to Travasol on Mon 5/2  Jeffifer Rabold D. Mina Marble, PharmD, BCPS, [place] 11/08/2020, 9:41 AM

## 2020-11-08 NOTE — Progress Notes (Signed)
Referring Physician(s): Dr. Michaelle Birks  Supervising Physician: Dr. Dwaine Gale  Patient Status:  [facility] - In-pt  Chief Complaint: Neuroendocrine tumor  Subjective: Patient resting comfortably on trach collar.  S/p LLQ drain exchange and upsize yesterday  Allergies: Ace inhibitors  Medications:  Current Facility-Administered Medications:  .  0.9 %  sodium chloride infusion, 250 mL, Intravenous, Continuous, [name], MD, Last Rate: 20 mL/hr at 10/23/20 0825, 250 mL at 10/23/20 0825 .  0.9 %  sodium chloride infusion, 100 mL, Intravenous, PRN, [name], MD, Paused at 11/08/20 [phone] .  0.9 %  sodium chloride infusion, 100 mL, Intravenous, PRN, [name], MD .  0.9 %  sodium chloride infusion, 100 mL, Intravenous, PRN, [name], MD, Stopped at 11/07/20 [phone] .  0.9 %  sodium chloride infusion, 100 mL, Intravenous, PRN, [name], MD .  alteplase (CATHFLO ACTIVASE) injection 2 mg, 2 mg, Intracatheter, Once PRN, [name], MD .  alteplase (CATHFLO ACTIVASE) injection 2 mg, 2 mg, Intracatheter, Once PRN, [name], MD .  Margrett Rud amiodarone (NEXTERONE) 1.8 mg/mL load via infusion 150 mg, 150 mg, Intravenous, Once, 150 mg at 10/30/20 1711 **FOLLOWED BY** [EXPIRED] amiodarone (NEXTERONE PREMIX) 360-4.14 MG/200ML-% (1.8 mg/mL) IV infusion, 60 mg/hr, Intravenous, Continuous, Last Rate: 16.67 mL/hr at 10/31/20 0400, 30 mg/hr at 10/31/20 0400 **FOLLOWED BY** amiodarone (NEXTERONE PREMIX) 360-4.14 MG/200ML-% (1.8 mg/mL) IV infusion, 60 mg/hr, Intravenous, Continuous, Josue Hector, MD, Last Rate: 33.3 mL/hr at 11/08/20 0742, 60 mg/hr at 11/08/20 0742 .  chlorhexidine (PERIDEX) 0.12 % solution 15 mL, 15 mL, Mouth Rinse, BID, [name], MD, 15 mL at 11/07/20 2146 .  Chlorhexidine Gluconate Cloth 2 % PADS 6 each, 6 each, Topical, Q0600, [name], MD, 6 each at 11/04/20 0945 .  Chlorhexidine Gluconate Cloth 2 %  PADS 6 each, 6 each, Topical, Q0600, [name], MD, 6 each at 11/03/20 1732 .  Chlorhexidine Gluconate Cloth 2 % PADS 6 each, 6 each, Topical, Q0600, [name], MD, 6 each at 11/08/20 [phone] .  Chlorhexidine Gluconate Cloth 2 % PADS 6 each, 6 each, Topical, Q0600, [name], MD .  fentaNYL (DURAGESIC) 100 MCG/HR 1 patch, 1 patch, Transdermal, Q72H, [name], MD, 1 patch at 11/06/20 0954 .  fentaNYL (SUBLIMAZE) injection 50-100 mcg, 50-100 mcg, Intravenous, Q2H PRN, [name], MD, 100 mcg at 11/08/20 3790 .  heparin injection 1,000 Units, 1,000 Units, Dialysis, PRN, [name], MD, 2,400 Units at 11/06/20 2000 .  heparin injection 1,900 Units, 20 Units/kg, Dialysis, PRN, [name], MD .  heparin injection 5,000 Units, 5,000 Units, Subcutaneous, Q8H, [name], MD, 5,000 Units at 11/08/20 [phone] .  insulin aspart (novoLOG) injection 0-9 Units, 0-9 Units, Subcutaneous, 4 times per day, [name], [facility], 2 Units at 11/08/20 0250 .  ipratropium-albuterol (DUONEB) 0.5-2.5 (3) MG/3ML nebulizer solution 3 mL, 3 mL, Nebulization, Q6H PRN, Agarwala, Ravi, MD .  lidocaine (PF) (XYLOCAINE) 1 % injection 5 mL, 5 mL, Intradermal, PRN, [name], MD .  lidocaine (PF) (XYLOCAINE) 1 % injection 5 mL, 5 mL, Intradermal, PRN, [name], MD .  lidocaine (XYLOCAINE) 1 % (with pres) injection 5 mL, 5 mL, Intradermal, Once, [name], MD .  lidocaine-prilocaine (EMLA) cream 1 application, 1 application, Topical, PRN, [name], MD .  lidocaine-prilocaine (EMLA) cream 1 application, 1 application, Topical, PRN, [name], MD .  MEDLINE  mouth rinse, 15 mL, Mouth Rinse, q12n4p, Lovick, [name], MD, 15 mL at 11/07/20 1642 .  ondansetron (ZOFRAN) injection 4 mg, 4 mg, Intravenous, Q6H PRN, [name], MD, 4 mg at 11/07/20 2251 .  pantoprazole (PROTONIX) injection 40 mg, 40 mg,  Intravenous, BID, Agarwala, Ravi, MD, 40 mg at 11/07/20 2146 .  pentafluoroprop-tetrafluoroeth (GEBAUERS) aerosol 1 application, 1 application, Topical, PRN, [name], MD .  pentafluoroprop-tetrafluoroeth (GEBAUERS) aerosol 1 application, 1 application, Topical, PRN, [name], MD .  polyvinyl alcohol (LIQUIFILM TEARS) 1.4 % ophthalmic solution 1 drop, 1 drop, Both Eyes, PRN, Donalynn Furlong, NP .  promethazine (PHENERGAN) 6.25 mg in sodium chloride 0.9 % 50 mL IVPB, 6.25 mg, Intravenous, Q6H PRN, [name], MD, Stopped at 11/07/20 2357 .  sodium chloride flush (NS) 0.9 % injection 10-40 mL, 10-40 mL, Intracatheter, Q12H, [name], MD, 10 mL at 11/07/20 2147 .  sodium chloride flush (NS) 0.9 % injection 10-40 mL, 10-40 mL, Intracatheter, PRN, [name], MD, 10 mL at 10/20/20 0930 .  sodium chloride flush (NS) 0.9 % injection 5 mL, 5 mL, Intracatheter, Q8H, [name], MD, 5 mL at 11/08/20 0646 .  TPN CYCLIC-ADULT (ION), , Intravenous, Cyclic-See Admin Instructions, [name], RPH, Paused at 11/08/20 0549    Vital Signs: BP (!) 89/48   Pulse 84   Temp 100 F (37.8 C) (Oral)   Resp (!) 23   Ht 6\' 2"  (1.88 m)   Wt 96 kg   SpO2 100%   BMI 27.17 kg/m   Physical Exam  NAD, on trach collar with PMSV.  Abdomen: LLQ drain in place. This old dark bloody output in bag   Imaging: CT ABDOMEN PELVIS WO CONTRAST  Result Date: 11/07/2020 CLINICAL DATA:  Midline wound drainage a a EXAM: CT ABDOMEN AND PELVIS WITHOUT CONTRAST TECHNIQUE: Multidetector CT imaging of the abdomen and pelvis was performed following the standard protocol without IV contrast. COMPARISON:  10/21/2020 FINDINGS: Lower chest: Small left pleural effusion. Bibasilar atelectasis, right greater than left. Pacemaker leads seen within the right ventricle toward the apex. Mild global cardiomegaly. No pericardial effusion. Gynecomastia noted. Hepatobiliary: The gallbladder has been  resected. A retrograde biliary drainage CT extends from the common duct percutaneously through the right upper quadrant abdominal wall. No intra or extrahepatic biliary ductal dilation. The liver is unremarkable. Decreasing right subhepatic loculated fluid collection, now measuring 1.7 x 4.7 cm (previously measuring 2.6 x 6.0 cm). Pancreas: Resection of the pancreatic head has been performed with a percutaneous retrograde pancreatic drainage catheter seen extending from the main pancreatic duct through the left upper quadrant abdominal wall. I do not clearly identify the pancreatico enteric anastomosis on this noncontrast examination. Spleen: Progressive splenomegaly with the spleen measuring 16.3 cm in greatest dimension. Adrenals/Urinary Tract: The adrenal glands are unremarkable. Asymmetric atrophy of the right kidney is unchanged. There is compensatory hypertrophy of the left kidney. No hydronephrosis. No intrarenal or ureteral calculi. The bladder is unremarkable. Stomach/Bowel: Balloon retention gastrostomy is seen within the distal stomach. The distal margin of the stomach appears stapled. I do not clearly identify the gastrojejunostomy. Surgical drainage catheters are again seen within the subhepatic space. Since the prior examination, a percutaneous drainage catheter has been placed via the left flank through the anterior pararenal space into the previously noted retroperitoneal hematoma. Much of the small bowel has been resected. No identifiable residual small-bowel is seen on this noncontrast examination. The distal transverse, descending, and  rectosigmoid colon remain. Since the prior examination, there has developed extensive loculated gas and fluid within the a surgical bed of the duodenal resection. This may relate to enteric communication/enteric leak, flushing of the drainage catheters with incomplete drainage, or, less likely, gas-forming organism. There is new hyperdensity within this collection,  best appreciated on image # 56, in keeping with a hematoma. This curvilinear collection is difficult to measure due to its configuration, but measures at least 4.1 x 16.0 cm at axial image # 49 within the a resection bed of the second and third portion of the duodenum and measures at least 2.6 x 9.1 cm antral laterally in the expected resection bed of the small bowel. Gas from the inferior aspect of this collection appears to extend into the midline incision and likely accounts for the reported wound drainage. Separate loculated collection is seen within the deep pelvis measuring 4.3 x 4.7 cm at axial image # 81. No gross free intraperitoneal gas. Vascular/Lymphatic: The mesenteric venous structures are not well appreciated on this noncontrast examination. The abdominal vasculature is otherwise unremarkable on this noncontrast examination. Reproductive: Prostate is unremarkable. Other: Subcutaneous infiltration within the left lower quadrant abdominal wall likely relates to subcutaneous injection. There is mild subcutaneous edema involving the flanks most in keeping with mild anasarca. Musculoskeletal: No acute bone abnormality. IMPRESSION: Complex examination. Suspected near complete resection of the small bowel and right hemicolectomy with the distal colon remaining. Resection of the a duodenum, head of the pancreas, and gallbladder in keeping with Whipple resection. The pancreatico biliary enteric limb is not well appreciated on this noncontrast examination. Percutaneous drainage of the biliary and pancreatic effluent noted. Repeat imaging with contrast administration, if possible, would better delineate the enteric and vascular structures. Interval percutaneous drainage catheter placement within the retroperitoneal hematoma noted on prior examination within the left anterior pararenal space. Interval development of large complex loculated collection containing extensive interstitial gas and high attenuation  material compatible with blood product within the retroperitoneum within the resection bed of the duodenum and small bowel. This may relate to enteric communication, catheter malfunction and incomplete drainage in combination with flushing of the drainage catheters, or, less likely, aggressive infection. This gas and fluid appears to communicate with the laparotomy incision inferiorly. 4.7 cm deep pelvic loculated fluid collection stable from prior examination. Right subhepatic loculated fluid collection appears slightly smaller in the interval. Electronically Signed   By: Fidela Salisbury MD   On: 11/07/2020 03:46    Labs:  CBC: Recent Labs    11/05/20 0900 11/05/20 1459 11/06/20 0734 11/07/20 0522 11/08/20 0559  WBC 9.9  --  9.7 9.2 8.8  HGB 10.1* 10.5* 9.8* 9.4* 9.1*  HCT 31.0* 31.7* 29.0* 28.3* 28.3*  PLT 182  --  232 189 201    COAGS: Recent Labs    09/26/2020 1947 09/24/20 0400 09/24/20 1650 10/02/2020 1909 Sep 30, 2020 1624 11/05/20 0656 11/05/20 1835 11/06/20 0508 11/06/20 1346 11/07/20 0522 11/08/20 0559  INR 0.9   < > 1.4* 1.4*   < > 2.2*   < > 3.4* 1.3* 1.3* 1.3*  APTT 35  --  32 39*  --  44*  --   --   --   --   --    < > = values in this interval not displayed.    BMP: Recent Labs    12/03/19 1703 05/24/20 1956 07/03/20 1643 07/21/20 0921 08/21/20 1037 11/05/20 0711 11/06/20 0651 11/07/20 0522 11/08/20 0559  NA 144   < >  142 142   < > 136 136 136 138  K 4.6   < > 3.7 4.2   < > 3.3* 3.1* 3.3* 3.9  CL 109*   < > 103 104   < > 99 96* 98 99  CO2 21   < > 25 27   < > 27 25 27 23   GLUCOSE 103*   < > 105* 83   < > 140* 139* 126* 171*  BUN 15   < > 18 16   < > 83* 117* 61* 109*  CALCIUM 9.5   < > 9.1 9.5   < > 8.2* 8.4* 7.9* 8.0*  CREATININE 1.27   < > 0.96 1.19   < > 5.06* 6.36* 3.95* 5.63*  GFRNONAA 66   < > 92 71   < > 13* 10* 18* 12*  GFRAA 77  --  107 82  --   --   --   --   --    < > = values in this interval not displayed.    LIVER FUNCTION  TESTS: Recent Labs    10/30/20 0508 10/31/20 0602 11/01/20 0900 11/02/20 0455 11/03/20 0519 11/04/20 0553 11/05/20 0711 11/06/20 0651 11/07/20 0522 11/08/20 0559  BILITOT 3.4*  --  2.8*  --  2.7*  --   --  2.9*  --   --   AST 105*  --  116*  --  103*  --   --  143*  --   --   ALT 129*  --  121*  --  126*  --   --  164*  --   --   ALKPHOS 93  --  87  --  108  --   --  98  --   --   PROT 7.9  --  6.1*  --  6.4*  --   --  6.5  --   --   ALBUMIN 1.3*   < > 1.2*   < > 1.2*   < > 1.3* 1.2* 1.1* 1.2*   < > = values in this interval not displayed.    Assessment and Plan: Neuroendocrine tumor of pancreas s/p Whipple procedure with hemicolectomy in OR, complicated by hemorrhagic shock with DIC and small bowel necrosis s/p small bowel resection 09/20/2020. Intra-abdominal fluid collections requiring multiple drain placements with IR S/p exchange/upsize tube yesterday. Continued bloody/hematoma output IR following  Electronically Signed: Ascencion Dike, PA-C 11/08/2020, 9:28 AM   I spent a total of 15 Minutes at the the patient's bedside AND on the patient's hospital floor or unit, greater than 50% of which was counseling/coordinating care for intra-abdominal fluid collection.

## 2020-11-09 LAB — RENAL FUNCTION PANEL
Albumin: 1.6 g/dL — ABNORMAL LOW (ref 3.5–5.0)
Anion gap: 10 (ref 5–15)
BUN: 39 mg/dL — ABNORMAL HIGH (ref 6–20)
CO2: 28 mmol/L (ref 22–32)
Calcium: 8 mg/dL — ABNORMAL LOW (ref 8.9–10.3)
Chloride: 97 mmol/L — ABNORMAL LOW (ref 98–111)
Creatinine, Ser: 2.63 mg/dL — ABNORMAL HIGH (ref 0.61–1.24)
GFR, Estimated: 29 mL/min — ABNORMAL LOW (ref >60)
Glucose, Bld: 143 mg/dL — ABNORMAL HIGH (ref 70–99)
Phosphorus: 1.4 mg/dL — ABNORMAL LOW (ref 2.5–4.6)
Potassium: 3.4 mmol/L — ABNORMAL LOW (ref 3.5–5.1)
Sodium: 135 mmol/L (ref 135–145)

## 2020-11-09 LAB — CBC
HCT: 25.2 % — ABNORMAL LOW (ref 39.0–52.0)
Hemoglobin: 8.4 g/dL — ABNORMAL LOW (ref 13.0–17.0)
MCH: 26.3 pg (ref 26.0–34.0)
MCHC: 33.3 g/dL (ref 30.0–36.0)
MCV: 78.8 fL — ABNORMAL LOW (ref 80.0–100.0)
Platelets: 210 K/uL (ref 150–400)
RBC: 3.2 MIL/uL — ABNORMAL LOW (ref 4.22–5.81)
RDW: 19.4 % — ABNORMAL HIGH (ref 11.5–15.5)
WBC: 8.8 K/uL (ref 4.0–10.5)
nRBC: 0 % (ref 0.0–0.2)

## 2020-11-09 LAB — GLUCOSE, CAPILLARY
Glucose-Capillary: 138 mg/dL — ABNORMAL HIGH (ref 70–99)
Glucose-Capillary: 167 mg/dL — ABNORMAL HIGH (ref 70–99)
Glucose-Capillary: 120 mg/dL — ABNORMAL HIGH (ref 70–99)
Glucose-Capillary: 91 mg/dL (ref 70–99)
Glucose-Capillary: 133 mg/dL — ABNORMAL HIGH (ref 70–99)
Glucose-Capillary: 153 mg/dL — ABNORMAL HIGH (ref 70–99)

## 2020-11-09 LAB — PROTIME-INR
INR: 1.4 — ABNORMAL HIGH (ref 0.8–1.2)
Prothrombin Time: 16.9 s — ABNORMAL HIGH (ref 11.4–15.2)

## 2020-11-09 MED ORDER — POTASSIUM PHOSPHATES 15 MMOLE/5ML IV SOLN
20.0000 mmol | Freq: Once | INTRAVENOUS | Status: AC
  Administered 2020-11-09: 20 mmol via INTRAVENOUS
  Filled 2020-11-09: qty 6.67

## 2020-11-09 MED ORDER — ZINC CHLORIDE 1 MG/ML IV SOLN
INTRAVENOUS | Status: AC
  Filled 2020-11-09: qty 1370.73

## 2020-11-09 MED ORDER — PIPERACILLIN-TAZOBACTAM 3.375 G IVPB 30 MIN
3.3750 g | Freq: Once | INTRAVENOUS | Status: AC
  Administered 2020-11-09: 3.375 g via INTRAVENOUS
  Filled 2020-11-09 (×2): qty 50

## 2020-11-09 MED ORDER — SODIUM CHLORIDE 0.9 % IV SOLN
2.2500 g | Freq: Three times a day (TID) | INTRAVENOUS | Status: DC
  Administered 2020-11-09 – 2020-11-11 (×5): 2.25 g via INTRAVENOUS
  Filled 2020-11-09: qty 10
  Filled 2020-11-09: qty 0.08
  Filled 2020-11-09: qty 10
  Filled 2020-11-09: qty 0.08
  Filled 2020-11-09 (×3): qty 10

## 2020-11-09 MED ORDER — HEPARIN SODIUM (PORCINE) 1000 UNIT/ML IJ SOLN
INTRAMUSCULAR | Status: AC
  Filled 2020-11-09: qty 4

## 2020-11-09 MED ORDER — ZINC CHLORIDE 1 MG/ML IV SOLN
INTRAVENOUS | Status: AC
  Filled 2020-11-09: qty 1370.72

## 2020-11-09 MED ORDER — ALBUMIN HUMAN 25 % IV SOLN
INTRAVENOUS | Status: AC
  Administered 2020-11-09: 25 g
  Filled 2020-11-09: qty 100

## 2020-11-09 NOTE — Progress Notes (Signed)
Pharmacy Antibiotic Note  [name] is a 50 y.o. male with fever and possible sepsis.  Pharmacy has been consulted for zosyn dosing. He is noted with AKI and has been on HD with last on 4/30 -WBC= 8.8, tmax= 100.9, SCr= 2.63 (trend down), noted on TPN  Plan: -Zosyn 3.375gm IV x1 followed by 2.25gm IV q8h -Will follow HD plans, cultures and clinical progress   Height: 6\' 2"  (188 cm) Weight: 96 kg (211 lb 10.3 oz) IBW/kg (Calculated) : 82.2  Temp (24hrs), Avg:99.5 F (37.5 C), Min:98.2 F (36.8 C), Max:100.9 F (38.3 C)  Recent Labs  Lab 11/05/20 0711 11/05/20 0900 11/06/20 0651 11/06/20 0734 11/07/20 0522 11/08/20 0559 11/09/20 0500  WBC  --  9.9  --  9.7 9.2 8.8 8.8  CREATININE 5.06*  --  6.36*  --  3.95* 5.63* 2.63*    Estimated Creatinine Clearance: 39.5 mL/min (A) (by C-G formula based on SCr of 2.63 mg/dL (H)).    Allergies  Allergen Reactions  . Ace Inhibitors Cough     Thank you for allowing pharmacy to be a part of this patient's care.  Hildred Laser, PharmD Clinical Pharmacist **Pharmacist phone directory can now be found on [email or web address] ([email or web address]).  Listed under [place].

## 2020-11-09 NOTE — Progress Notes (Addendum)
23 Days Post-Op   Subjective/Chief Complaint: No events overnight, alert follows commands   Objective: Vital signs in last 24 hours: Temp:  [98.2 F (36.8 C)-99.9 F (37.7 C)] 98.2 F (36.8 C) (05/01 0730) Pulse Rate:  [74-89] 82 (05/01 1030) Resp:  [15-29] 22 (05/01 1030) BP: (54-99)/(37-64) 95/48 (05/01 1030) SpO2:  [89 %-100 %] 96 % (05/01 1030) FiO2 (%):  [28 %] 28 % (05/01 0815) Last BM Date:  (pta)  Intake/Output from previous day: 04/30 0701 - 05/01 0700 In: 3976.3 [I.V.:3753.3; IV Piggyback:222.9] Out: 2031 [Urine:55; Drains:1575] Intake/Output this shift: Total I/O In: 198.4 [I.V.:198.4] Out: -   General: NAD Neuro:follows commands HEENT: trach in place, site is clean Resp: clear, on trach collar CV: RRR Abdomen: soft, nondistended,vac in place on midline  RUQ drain with minimal output Biliary drain with bile. LUQ JP with scant old blood in drain. G tube in place to gravity drainage Extremities: cr<2 secs   Lab Results:  Recent Labs    11/08/20 0559 11/09/20 0500  WBC 8.8 8.8  HGB 9.1* 8.4*  HCT 28.3* 25.2*  PLT 201 210   BMET Recent Labs    11/08/20 0559 11/09/20 0500  NA 138 135  K 3.9 3.4*  CL 99 97*  CO2 23 28  GLUCOSE 171* 143*  BUN 109* 39*  CREATININE 5.63* 2.63*  CALCIUM 8.0* 8.0*   PT/INR Recent Labs    11/08/20 0559 11/09/20 0500  LABPROT 16.0* 16.9*  INR 1.3* 1.4*   ABG No results for input(s): PHART, HCO3 in the last 72 hours.  Invalid input(s): PCO2, PO2  Studies/Results: No results found.  Anti-infectives: Anti-infectives (From admission, onward)   Start     Dose/Rate Route Frequency Ordered Stop   10/26/20 1000  piperacillin-tazobactam (ZOSYN) IVPB 2.25 g  Status:  Discontinued        2.25 g 100 mL/hr over 30 Minutes Intravenous Every 12 hours 10/25/20 2233 10/26/20 0715   10/26/20 0815  piperacillin-tazobactam (ZOSYN) IVPB 2.25 g  Status:  Discontinued        2.25 g 100 mL/hr over 30 Minutes Intravenous  Every 8 hours 10/26/20 0715 10/29/20 0647   10/22/20 0930  anidulafungin (ERAXIS) 100 mg in sodium chloride 0.9 % 100 mL IVPB  Status:  Discontinued       "Followed by" Linked Group Details   100 mg 78 mL/hr over 100 Minutes Intravenous Every 24 hours 10/21/20 0840 10/29/20 0647   10/21/20 2200  piperacillin-tazobactam (ZOSYN) IVPB 2.25 g  Status:  Discontinued        2.25 g 100 mL/hr over 30 Minutes Intravenous Every 8 hours 10/21/20 1410 10/25/20 2233   10/21/20 1400  piperacillin-tazobactam (ZOSYN) IVPB 2.25 g  Status:  Discontinued        2.25 g 100 mL/hr over 30 Minutes Intravenous Every 8 hours 10/21/20 0742 10/21/20 1410   10/21/20 0930  anidulafungin (ERAXIS) 200 mg in sodium chloride 0.9 % 200 mL IVPB       "Followed by" Linked Group Details   200 mg 78 mL/hr over 200 Minutes Intravenous  Once 10/21/20 0840 10/21/20 1411   10/19/20 1600  piperacillin-tazobactam (ZOSYN) IVPB 3.375 g  Status:  Discontinued        3.375 g 100 mL/hr over 30 Minutes Intravenous Every 6 hours 10/19/20 1048 10/21/20 0742   10/18/20 1600  piperacillin-tazobactam (ZOSYN) IVPB 3.375 g  Status:  Discontinued        3.375 g 100 mL/hr over 30 Minutes  Intravenous Every 6 hours 10/18/20 1123 10/19/20 1048   10/15/20 0400  piperacillin-tazobactam (ZOSYN) IVPB 3.375 g  Status:  Discontinued        3.375 g 12.5 mL/hr over 240 Minutes Intravenous Every 8 hours 10/14/20 2107 10/18/20 1123   10/14/20 2200  piperacillin-tazobactam (ZOSYN) IVPB 3.375 g        3.375 g 100 mL/hr over 30 Minutes Intravenous  Once 10/14/20 2107 10/14/20 2238   10/01/20 1100  anidulafungin (ERAXIS) 100 mg in sodium chloride 0.9 % 100 mL IVPB  Status:  Discontinued       "Followed by" Linked Group Details   100 mg 78 mL/hr over 100 Minutes Intravenous Every 24 hours 09/30/20 1014 10/08/20 0951   09/30/20 1100  anidulafungin (ERAXIS) 200 mg in sodium chloride 0.9 % 200 mL IVPB       "Followed by" Linked Group Details   200 mg 78 mL/hr  over 200 Minutes Intravenous  Once 09/30/20 1014 09/30/20 1516   09/18/2020 1600  vancomycin (VANCOREADY) IVPB 1000 mg/200 mL  Status:  Discontinued        1,000 mg 200 mL/hr over 60 Minutes Intravenous Every 24 hours 09/19/2020 1137 09/20/2020 1510   09/23/2020 1400  piperacillin-tazobactam (ZOSYN) IVPB 3.375 g  Status:  Discontinued        3.375 g 12.5 mL/hr over 240 Minutes Intravenous Every 8 hours 09/24/2020 1322 10/08/20 0952   09/22/2020 1430  vancomycin (VANCOREADY) IVPB 1750 mg/350 mL  Status:  Discontinued        1,750 mg 175 mL/hr over 120 Minutes Intravenous Every 24 hours 09/14/2020 1349 09/28/2020 1137   09/26/20 1130  vancomycin (VANCOREADY) IVPB 2000 mg/400 mL        2,000 mg 200 mL/hr over 120 Minutes Intravenous  Once 09/26/20 1031 09/26/20 1411   09/26/20 1030  vancomycin variable dose per unstable renal function (pharmacist dosing)  Status:  Discontinued         Does not apply See admin instructions 09/26/20 1031 09/28/20 0816   09/19/2020 2200  metroNIDAZOLE (FLAGYL) IVPB 500 mg  Status:  Discontinued        500 mg 100 mL/hr over 60 Minutes Intravenous Every 8 hours 09/16/2020 1820 10/02/2020 1322   09/20/2020 2000  cefTRIAXone (ROCEPHIN) 2 g in sodium chloride 0.9 % 100 mL IVPB  Status:  Discontinued        2 g 200 mL/hr over 30 Minutes Intravenous Every 24 hours 09/10/2020 1820 09/14/2020 1322   10/02/2020 1330  vancomycin (VANCOCIN) 2,250 mg in sodium chloride 0.9 % 500 mL IVPB  Status:  Discontinued        2,250 mg 250 mL/hr over 120 Minutes Intravenous Every 48 hours 10/01/2020 1228 10/06/2020 1320   09/21/2020 1300  cefTRIAXone (ROCEPHIN) 2 g in sodium chloride 0.9 % 100 mL IVPB  Status:  Discontinued        2 g 200 mL/hr over 30 Minutes Intravenous Every 24 hours 09/14/2020 1208 09/24/2020 1320   09/23/2020 1300  metroNIDAZOLE (FLAGYL) IVPB 500 mg  Status:  Discontinued        500 mg 100 mL/hr over 60 Minutes Intravenous Every 8 hours 10/05/2020 1208 10/09/2020 1320   10/04/2020 0915  metroNIDAZOLE  (FLAGYL) IVPB 500 mg  Status:  Discontinued        500 mg 100 mL/hr over 60 Minutes Intravenous To Surgery 09/16/2020 0903 09/15/2020 0951   10/04/2020 0630  ceFAZolin (ANCEF) IVPB 2g/100 mL premix       "  And" Linked Group Details   2 g 200 mL/hr over 30 Minutes Intravenous On call to O.R. 10/02/2020 2482 10/04/2020 1215   09/13/2020 0630  metroNIDAZOLE (FLAGYL) IVPB 500 mg       "And" Linked Group Details   500 mg 100 mL/hr over 60 Minutes Intravenous On call to O.R. 09/24/2020 5003 09/21/2020 0820      Assessment/Plan: -Hold on tunneled hd catheter, drain exchanged will follow, not febrile and wbc normal.  Vac with not much output last 24 hours so hopefully drain is helping - NPO, TPN, ice chips for comfort - PT/OT -I dont think treating map is indicated now- he doesn't appear to have infection, tolerates this fine, will not continue to push fluid for numbers.  - Amio gtt for afib, cards following - Fentanyl patchfor pain, with prn IV fentanyl for breakthrough pain - VTE:sqh  Rolm Bookbinder 11/09/2020   Addendum: Has low grade fever again, I am going to send blood cultures. Only real source is abdomen with new drain.  I am going to empirically start on zosyn again.

## 2020-11-09 NOTE — Progress Notes (Signed)
[facility] NEPHROLOGY PROGRESS NOTE  Assessment/ Plan: Pt is a 50 y.o. yo male with neuroendocrine tumor status post Whipple procedure right colectomy and SMV repair, developed AKI on dialysis.  #Acute kidney injury likely ischemic ATN with hypotensive episode requiring Levophed in the setting of very complicated surgical history and multiple visits to the OR, DIC, diffuse small bowel necrosis.  Creatinine level is fluctuating represents compromised renal function with repeated insults.  No evidence of recovery so far.  He required CRRT from 4/9-10/2009.  Since then undergoing intermittent hemodialysis TTS schedule.  He has increased drain output and volume status looks okay however both BUN and creatinine level climbing up.  He had dialysis yesterday for the clearance.   Continue to watch for renal recovery and daily assessment for dialysis need.  #Acute respiratory failure required mechanical ventilation. s/p trach on 4/8.  #Neuroendocrine tumor status post Whipple procedure with extensive small bowel necrosis status post resection.  Currently on TPN.  General surgery and IR  following.  #Severe protein calorie malnutrition, hypoalbuminemia on TPN.  #Hypokalemia: Managed with dialysis and TPN.    #Anemia: Hemoglobin stable.  Transfuse PRBC as needed.  #Hypotension: Monitor BP, asymptomatic, he is npo and on TPN so unable to receive midodrine.  May need pressors.  Subjective: Seen and examined.  He had dialysis yesterday with hypotension.  No UF and required albumin.  Urine output recorded only 55 cc however he has total drain output around 2 L.  No other new event.  Objective Vital signs in last 24 hours: Vitals:   11/09/20 0945 11/09/20 1000 11/09/20 1015 11/09/20 1030  BP: (!) 93/48 (!) 98/50 (!) 97/49 (!) 95/48  Pulse: 80 84 82 82  Resp: (!) 22 (!) 24 (!) 24 (!) 22  Temp:      TempSrc:      SpO2: 97% 97% 95% 96%  Weight:      Height:       Weight change:    Intake/Output Summary (Last 24 hours) at 11/09/2020 1039 Last data filed at 11/09/2020 0800 Gross per 24 hour  Intake 3396.09 ml  Output 2031 ml  Net 1365.09 ml       Labs: Basic Metabolic Panel: Recent Labs  Lab 11/07/20 0522 11/08/20 0559 11/09/20 0500  NA 136 138 135  K 3.3* 3.9 3.4*  CL 98 99 97*  CO2 27 23 28   GLUCOSE 126* 171* 143*  BUN 61* 109* 39*  CREATININE 3.95* 5.63* 2.63*  CALCIUM 7.9* 8.0* 8.0*  PHOS 1.3* 2.5 1.4*   Liver Function Tests: Recent Labs  Lab 11/03/20 0519 11/04/20 0553 11/06/20 0651 11/07/20 0522 11/08/20 0559 11/09/20 0500  AST 103*  --  143*  --   --   --   ALT 126*  --  164*  --   --   --   ALKPHOS 108  --  98  --   --   --   BILITOT 2.7*  --  2.9*  --   --   --   PROT 6.4*  --  6.5  --   --   --   ALBUMIN 1.2*   < > 1.2* 1.1* 1.2* 1.6*   < > = values in this interval not displayed.   No results for input(s): LIPASE, AMYLASE in the last 168 hours. No results for input(s): AMMONIA in the last 168 hours. CBC: Recent Labs  Lab 11/03/20 0519 11/03/20 0838 11/05/20 0900 11/05/20 1459 11/06/20 0734 11/07/20 0522 11/08/20  0559 11/09/20 0500  WBC 8.3   < > 9.9  --  9.7 9.2 8.8 8.8  NEUTROABS 5.9  --   --   --   --   --   --   --   HGB 7.9*   < > 10.1*   < > 9.8* 9.4* 9.1* 8.4*  HCT 24.6*   < > 31.0*   < > 29.0* 28.3* 28.3* 25.2*  MCV 85.7   < > 84.2  --  82.6 80.9 82.7 78.8*  PLT 143*   < > 182  --  232 189 201 210   < > = values in this interval not displayed.   Cardiac Enzymes: No results for input(s): CKTOTAL, CKMB, CKMBINDEX, TROPONINI in the last 168 hours. CBG: Recent Labs  Lab 11/08/20 1312 11/08/20 1516 11/08/20 1916 11/09/20 0322 11/09/20 0742  GLUCAP 119* 91 131* 138* 167*    Iron Studies: No results for input(s): IRON, TIBC, TRANSFERRIN, FERRITIN in the last 72 hours. Studies/Results: No results found.  Medications: Infusions: . sodium chloride 250 mL (10/23/20 0825)  . sodium chloride Stopped  (11/08/20 1119)  . sodium chloride    . sodium chloride 10 mL/hr at 11/09/20 0800  . sodium chloride    . amiodarone 60 mg/hr (11/09/20 0800)  . potassium PHOSPHATE IVPB (in mmol) 20 mmol (11/09/20 0950)  . promethazine (PHENERGAN) injection (IM or IVPB) Stopped (11/08/20 2314)  . TPN CYCLIC-ADULT (ION) 128 mL/hr at 11/09/20 0800  . TPN CYCLIC-ADULT (ION)      Scheduled Medications: . chlorhexidine  15 mL Mouth Rinse BID  . Chlorhexidine Gluconate Cloth  6 each Topical Q0600  . fentaNYL  1 patch Transdermal Q72H  . heparin injection (subcutaneous)  5,000 Units Subcutaneous Q8H  . heparin sodium (porcine)      . insulin aspart  0-9 Units Subcutaneous 4 times per day  . lidocaine  5 mL Intradermal Once  . mouth rinse  15 mL Mouth Rinse q12n4p  . pantoprazole (PROTONIX) IV  40 mg Intravenous BID  . sodium chloride flush  10-40 mL Intracatheter Q12H  . sodium chloride flush  5 mL Intracatheter Q8H    have reviewed scheduled and prn medications.  Physical Exam: General: Lying in bed, tracheostomy Heart:RRR, s1s2 nl Lungs: Clear anteriorly Abdomen:soft, non-distended Extremities: Trace LE edema Dialysis Access: IJ HD catheter in place.  [name] Glennis Monten[place]ro 11/09/2020,10:39 AM  LOS: 48 days

## 2020-11-09 NOTE — Progress Notes (Signed)
PHARMACY - TOTAL PARENTERAL NUTRITION CONSULT NOTE  Indication:  Short bowel syndrome  Patient Measurements: (updated) Height: 6\' 2"  (188 cm) Weight: 97.4 kg (214 lb 11.7 oz) IBW/kg (Calculated) : 82.2 TPN AdjBW (KG): 95.3 Body mass index is 27.57 kg/m.  Assessment:  9 YOM with history of mass adjacent to head of the pancreas in 2021. 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  Developed cholecystitis in Feb 2022 and had percutaneous cholecystectomy tube placement.  Presented this admission for neuroendocrine tumor resection with Whipple procedure, also had right total colectomy, colostomy and cholecystectomy on 09/09/2020. SMV was lacerated during procedure and repaired by bovine pericardial patch. Developed hemorrhagic shock with bile leak post-op and underwent placement of biliary T tube with abdominal washout on 3/15 PM. Antimicrobial therapies discontinued 4/20. Pharmacy consulted to manage TPN.   Glucose / Insulin: no hx DM, A1c 5.2%. CBGs 130-160s on TPN, 90-120s off TPN. Utilized 3 unit sSSI in the past 24 hours and 60 units in TPN Electrolytes: s/p HD on 4/30 - K down 3.4, low CL, Phos down to 1.4, others WNL Renal: CRRT started 4/9 > 4/11, now on iHD TTS (4K baths) Hepatic: LFTs mildly elevated, Tbili down 2.9, albumin 1.6, prealbumin 14.8  TG peaked at 740 (3/26) - last value 234 (4/28)  Intake / Output; MIVF: no UOP, drains (5 total): 1524ml, net +20.6L GI Imaging: 4/6 CT - pelvic fluid collection concerning for abscess 4/12 CT - no new fluid collections 4/28 CT - new fluid+gas collection in RLQ GI Surgeries / Procedures: 3/14 Whipple procedure with R hemicolectomy and end ileostomy, cholecystectomy 3/15 washout, VAC placement 3/17 washout, VAC replacement.  Necrosis of entire small bowel except for the PB limb and proximal 40-50cm of jejunum 3/19 re-opening laparotomy, abd closure.  Necrotic SB. 3/21 SB resection, take down of choledochojejunal  anastomosis and pancreaticojejunal anastomosis, take down of duodenojejunal anastomosis, placement of externalized biliary drain / Stamm gastrostomy tube, IA washout and placement of intraperitoneal drains 3/30 extubation> 4/8 trach 4/12 left lateral abdominal drain placed by IR  Central access: R IJ CVC placed 3/15; changed to PICC placed 10/03/20 TPN start date: 09/23/2020  Nutritional Goals (RD rec on 4/21): 2800-3000 kCal, 185-210g AA, fluid >2L/day -on lipid days (MWF): 3120 kcal with 205g protein, 528g CHO, 50g SMOF lipid -on NON-lipid days (TThSaSun): 2618kcal, 528g CHO, 205g protein   Current Nutrition:  TPN  Plan:  - Continue cyclic TPN, infuse 4580 mls over 18 hrs: 78 ml/hr x1 hr, 155 ml/hr x 16 hrs, 78 ml/hr x 1hr - Continue 19g/L SMOFlipid on MWF due to elevated TG - TPN provides a weekly avg of 2833 kCal and 205g AA per day, meeting 100% of needs - Electrolytes in TPN: incr Na 123mEq/L, incr K 45mEq/L, no Ca, Mg 22mEq/L, incr Phos 70mmol/L, change Cl:Ac 2:1 - Daily multivitamin in TPN. Remove standard trace elements and add back zinc 5mg  and selenium 3mcg. Remove chromium while on RRT. - Custom sensitive SSI schedule 0200, 1300, 1500, 2000 to correspond with cycle times  - Continue 60 units insulin regular in TPN  - KPhos 20 mmol IV x 1 - Standard TPN labs on Mon/Thurs - Monitor hemodynamic stability.  Hold off on shortening TPN cycle further. - Clinisol on national shortage, f/u transition to Travasol if needed  Phyllip Claw D. Mina Marble, PharmD, BCPS, [place] 11/09/2020, 8:55 AM

## 2020-11-09 NOTE — Progress Notes (Signed)
Spoke w/ Dr. Donne Hazel regarding blood pressures consistently low around 90/50 MAP 60, order to monitor for now and notify if BP gets lower.

## 2020-11-09 DEATH — deceased

## 2020-11-10 ENCOUNTER — Inpatient Hospital Stay (HOSPITAL_COMMUNITY): Payer: Managed Care, Other (non HMO)

## 2020-11-10 DIAGNOSIS — D3A8 Other benign neuroendocrine tumors: Secondary | ICD-10-CM | POA: Diagnosis not present

## 2020-11-10 LAB — COMPREHENSIVE METABOLIC PANEL
ALT: 142 U/L — ABNORMAL HIGH (ref 0–44)
AST: 83 U/L — ABNORMAL HIGH (ref 15–41)
Albumin: 1.4 g/dL — ABNORMAL LOW (ref 3.5–5.0)
Alkaline Phosphatase: 66 U/L (ref 38–126)
Anion gap: 14 (ref 5–15)
BUN: 85 mg/dL — ABNORMAL HIGH (ref 6–20)
CO2: 21 mmol/L — ABNORMAL LOW (ref 22–32)
Calcium: 7.9 mg/dL — ABNORMAL LOW (ref 8.9–10.3)
Chloride: 100 mmol/L (ref 98–111)
Creatinine, Ser: 4.93 mg/dL — ABNORMAL HIGH (ref 0.61–1.24)
GFR, Estimated: 14 mL/min — ABNORMAL LOW (ref >60)
Glucose, Bld: 143 mg/dL — ABNORMAL HIGH (ref 70–99)
Potassium: 4.3 mmol/L (ref 3.5–5.1)
Sodium: 135 mmol/L (ref 135–145)
Total Bilirubin: 3.7 mg/dL — ABNORMAL HIGH (ref 0.3–1.2)
Total Protein: 6.7 g/dL (ref 6.5–8.1)

## 2020-11-10 LAB — GLUCOSE, CAPILLARY
Glucose-Capillary: 175 mg/dL — ABNORMAL HIGH (ref 70–99)
Glucose-Capillary: 118 mg/dL — ABNORMAL HIGH (ref 70–99)
Glucose-Capillary: 88 mg/dL (ref 70–99)
Glucose-Capillary: 98 mg/dL (ref 70–99)

## 2020-11-10 LAB — MAGNESIUM: Magnesium: 2.4 mg/dL (ref 1.7–2.4)

## 2020-11-10 LAB — RENAL FUNCTION PANEL
Albumin: 1.3 g/dL — ABNORMAL LOW (ref 3.5–5.0)
Anion gap: 14 (ref 5–15)
BUN: 85 mg/dL — ABNORMAL HIGH (ref 6–20)
CO2: 22 mmol/L (ref 22–32)
Calcium: 8 mg/dL — ABNORMAL LOW (ref 8.9–10.3)
Chloride: 100 mmol/L (ref 98–111)
Creatinine, Ser: 4.94 mg/dL — ABNORMAL HIGH (ref 0.61–1.24)
GFR, Estimated: 14 mL/min — ABNORMAL LOW (ref >60)
Glucose, Bld: 145 mg/dL — ABNORMAL HIGH (ref 70–99)
Phosphorus: 3.9 mg/dL (ref 2.5–4.6)
Potassium: 4.3 mmol/L (ref 3.5–5.1)
Sodium: 136 mmol/L (ref 135–145)

## 2020-11-10 LAB — DIFFERENTIAL
Abs Immature Granulocytes: 0.4 10*3/uL — ABNORMAL HIGH (ref 0.00–0.07)
Basophils Absolute: 0.1 10*3/uL (ref 0.0–0.1)
Basophils Relative: 1 %
Eosinophils Absolute: 0.1 10*3/uL (ref 0.0–0.5)
Eosinophils Relative: 1 %
Lymphocytes Relative: 15 %
Lymphs Abs: 1.3 10*3/uL (ref 0.7–4.0)
Metamyelocytes Relative: 3 %
Monocytes Absolute: 0.4 10*3/uL (ref 0.1–1.0)
Monocytes Relative: 5 %
Myelocytes: 2 %
Neutro Abs: 6.3 10*3/uL (ref 1.7–7.7)
Neutrophils Relative %: 73 %
nRBC: 0 /100 WBC

## 2020-11-10 LAB — PROTIME-INR
INR: 1.3 — ABNORMAL HIGH (ref 0.8–1.2)
Prothrombin Time: 16.6 seconds — ABNORMAL HIGH (ref 11.4–15.2)

## 2020-11-10 LAB — CBC
HCT: 25.9 % — ABNORMAL LOW (ref 39.0–52.0)
Hemoglobin: 8.5 g/dL — ABNORMAL LOW (ref 13.0–17.0)
MCH: 26.3 pg (ref 26.0–34.0)
MCHC: 32.8 g/dL (ref 30.0–36.0)
MCV: 80.2 fL (ref 80.0–100.0)
Platelets: 192 10*3/uL (ref 150–400)
RBC: 3.23 MIL/uL — ABNORMAL LOW (ref 4.22–5.81)
RDW: 20.5 % — ABNORMAL HIGH (ref 11.5–15.5)
WBC: 8.6 10*3/uL (ref 4.0–10.5)
nRBC: 0 % (ref 0.0–0.2)

## 2020-11-10 LAB — PHOSPHORUS: Phosphorus: 4 mg/dL (ref 2.5–4.6)

## 2020-11-10 LAB — PREALBUMIN: Prealbumin: 7.8 mg/dL — ABNORMAL LOW (ref 18–38)

## 2020-11-10 LAB — TRIGLYCERIDES: Triglycerides: 153 mg/dL — ABNORMAL HIGH (ref <150)

## 2020-11-10 MED ORDER — SODIUM CHLORIDE 0.9 % IV BOLUS
500.0000 mL | Freq: Once | INTRAVENOUS | Status: AC
  Administered 2020-11-10: 500 mL via INTRAVENOUS

## 2020-11-10 MED ORDER — DIPHENHYDRAMINE HCL 50 MG/ML IJ SOLN
50.0000 mg | Freq: Every evening | INTRAMUSCULAR | Status: DC | PRN
  Administered 2020-11-10 – 2020-11-23 (×3): 50 mg via INTRAVENOUS
  Filled 2020-11-10 (×3): qty 1

## 2020-11-10 MED ORDER — LORAZEPAM 2 MG/ML IJ SOLN
1.0000 mg | Freq: Once | INTRAMUSCULAR | Status: AC
  Administered 2020-11-10: 1 mg via INTRAVENOUS

## 2020-11-10 MED ORDER — ALTEPLASE 2 MG IJ SOLR
2.0000 mg | Freq: Once | INTRAMUSCULAR | Status: AC
  Administered 2020-11-10: 2 mg

## 2020-11-10 MED ORDER — LORAZEPAM 2 MG/ML IJ SOLN
INTRAMUSCULAR | Status: AC
  Filled 2020-11-10: qty 1

## 2020-11-10 MED ORDER — ZINC CHLORIDE 1 MG/ML IV SOLN
INTRAVENOUS | Status: AC
  Filled 2020-11-10: qty 1370.73

## 2020-11-10 MED ORDER — NOREPINEPHRINE 4 MG/250ML-% IV SOLN
0.0000 ug/min | INTRAVENOUS | Status: DC
  Administered 2020-11-10: 2 ug/min via INTRAVENOUS
  Administered 2020-11-11: 20 ug/min via INTRAVENOUS
  Administered 2020-11-11: 24 ug/min via INTRAVENOUS
  Filled 2020-11-10 (×4): qty 250

## 2020-11-10 NOTE — Progress Notes (Signed)
[name], MRN:  [id number], DOB:  03-13-1971, LOS: 2 ADMISSION DATE:  Oct 12, 2020, CONSULTATION DATE:  11/10/20 REFERRING MD:  Anesthesia, CHIEF COMPLAINT:  Whipple, post-op shock   Brief History:  50 y.o. M with PMH of neuroendocrine tumor and underwent Whipple procedure 3/14 with intra-operative SMV laceration and repair by vascular with bovine pericardial patch.  Pt had worsening shock overnight and required three pressors despite massive transfusion protocol (received 150 blood products).  He was taken back to the OR for washout on 3/15 and returned to the ICU intubated.  Taken back 3/17 again found to have small bowel necrosis s/p resection.  Went back for ex-lap on 3/19, noted to have necrosis of nearly entirety of bowel, no bowel resected.  Fascia closed.  Case was discussed with family and [facility] transplant surgery for consideration of eventual small bowel transplant.  Electively returned to the OR on 3/21 for resection of remaining necrotic small bowel removed, G-tube placed, JP drains in place (2). Tracheostomy ws performed by Dr. Bobbye Morton on 4/8.  Started CRRT for AKI on 4/9.    Past Medical History:  Neuroendocrine tumor of pancreas, resected 2020-10-12, negative surgical margins, lymph nodes negative Hypertension Chronic Systolic heart failure LVEF 35% in 2011, 2022 LVEF 50%, RVSP normal, LA dilated Non-ischemic cardioyopathy Hyperlipidemia V-fib with arrest, AICD placed in [facility] Events:  3/14: Admit to Surgery, to OR for whipple, SMV injury and repair, massive transfusion protocol overnight  3/15: Back to OR for washout, x2. IR for arteriogram. No major bleed source identified in OR, or arterial bleed w IR. Robust product resuscitation >75 products (+ TXA + novoseven) 3/16: off pressors, add'l 39 products overnight. Slowing resuscitation this morning with hemodynamic improvements and slowing drain output; OR x 2 - washout, biliary t tube, wound vac --  washout, wound vac, IR for arteriogram -- no arterial bleed for embolization 3/17:  Aggressive balanced transfusion continue overnight with marked hemodynamic improvement since yesterday evening. Off pressors x several hours, Wound vac output significantly slowing (from 546ml q15-29min to 53ml q1hr+) and output is much thinner, Thin bloody secretions from mouth approx 258ml, Decreased RR from 22 to 18, Unable to tolerate NE- severe bradycardia 3/19 taken back to OR. Small bowel noted to be almost completely necrosed. Patient was closed with plans for goals of car discussion.  3/20 [place] family requests full scope of treatment. Primary team discussed with [facility] the possibility of transfer for small bowel transplant. [facility] felt as though transfer would not change outcome. 3/21 Ongoing discussion with family and [facility]. Patient may be a candidate for transplant if he can stabilize post a small bowel resection. He was taken back to the OR and small bowel was resected.  3/23 weaning pressors. Versed off. Remains encephalopathic 3/24 off pressors. Low dose dilaudid gtt -- only weakly grimaces to pain. Changing sedation to precedex + PRN fentanyl. Long discussion with 2 brothers regarding clinical case -- tried to clarify that while the term "stable" has been used, in this instance is meaning that he has not declined from previous shift but is in fact still critically ill with multisystem organ dysfunction.  3/25 Cr and BUN increased. Off sedation  3/26 Pressors off, CT head benign, BUN/Cr continue to creep up 3/27 Tachycardia/HTN overnight improved with fentanyl gtt 3/28 PCCM sign off. Trauma to take over vent/CCM needs.  3/30 extubated 4/2 desaturated overnight to the 30s improved with BVM and NTS.  4/5 poor airway protection. PCCM  consulted for re-intubation.  4/6 CT a/p Large RLL opacity, smaller LLL opacity. Post op changes. 5.4cm fluid collection in posterior pelvis, concerning for abscess. Started on Zosyn   4/7 got transfused overnight and then diuresed. Worse renal function 4/8 Tracheostomy performed by general surgery 4/9 transfused again. HD line inserted and started on CRRT 4/10 started on trach collar trials. 4/12 off CRRT and transitioned to IHD. Surgery placed abdominal drain with minimal output-- during procedure seemed c/w hematoma however since grew out klebsiella  4/13, 4/14 Tolerated dialysis 4/15 Placed on vent overnight for rest 4/16 Tolerating trach collar 4/17 got HD. Trach downsized to 6 cuffless, trach dislodged overnight, replaced with #6 cuffless shiley.  4/18 afebrile, trach collar + PMV. Up to chair with PT, PCCM signed off 4/21 decannulated; called back for hypotension, Afib/RVR, increased output from his 5 drains and wound vac.Transfused 2 units, re-cannulated, on Levo and Neo.  ICD fired x2, cardiology consulted 4/24 Continues to tolerate trach collar, 5L 28% 4/29 fever overnight, no complaints, remains on tracheostomy collar 4/30-5/1 fever continued, WBC unchanged, blood cultures sent, zosyn restarted   Consults:  PCCM VVS  IR  Palliative Cardiology Nephrology  Procedures:  PICC 3/25 > JP x 2  Biliary drain Pancreatic stent drain ETT 2/24>>3/13; 3/15 >> 3/29;  4/5>>4/8 Trach 4/8> L lateral abdominal drain 4/12>  Significant Diagnostic Tests:  See radiology tab Most recent significant imaging:  4/12 CT abd/pelvis>>  Unchanged ill-defined fluid collections within the left side of the root of the abdominal mesentery, the ventral aspect of the abdominal mesentery, the subcapsular aspect of the right lobe of the liver and the pelvic cul-de-sac all of which are incompletely evaluated on this noncontrast examination though may represent areas of evolving hematomas giving imaging stability for the past week. Redemonstrated ill-defined stranding surrounding the remaining portions of the pancreas without definable/drainable fluid collection on this noncontrast  examination.  Micro Data:  See micro tab Most recent significant findings:   4/5 trach asp >> Klebsiella pneumoniae 4/12 Pelvic abscess >> ampicillin resistant Klebsiella pneumoniae 4/12 BCx> no growth   5/1 blood cultures  Antimicrobials:  Zosyn 3/21 > 3/29; 4/5 >4/19; 5/1 >  Eraxis 3/23> 3/29, 4/12>4/19   Interim History / Subjective:   4/29 fever overnight, no complaints, remains on tracheostomy collar    Objective   Blood pressure (!) 87/46, pulse 82, temperature 98.8 F (37.1 C), temperature source Oral, resp. rate (!) 30, height 6\' 2"  (1.88 m), weight 96 kg, SpO2 97 %.    FiO2 (%):  [28 %] 28 %   Intake/Output Summary (Last 24 hours) at 11/10/2020 0841 Last data filed at 11/10/2020 0800 Gross per 24 hour  Intake 4376.75 ml  Output 1865 ml  Net 2511.75 ml   Filed Weights   11/04/20 0700 11/06/20 0500 11/06/20 1530  Weight: 97.4 kg 95.6 kg 96 kg   Physical Exam:  General:  Resting comfortably in bed HENT: NCAT OP clear tracheostomy in place with minimal drainage PULM: CTA B, normal effort CV: RRR, no mgr GI: no bowel sounds, wound vac, multiple drains MSK: normal bulk and tone Neuro: awake, alert, nods head to conversation, [facility] Problem list   Hemorrhagic shock hypophosphatemia Septic shock  Klebsiella pneumoniae pneumonia Acute hypoxemic respiratory failure requiring mechanical ventilation  Assessment & Plan:   Acute respiratory failure with hypoxia > improved S/p Tracheostomy -- decannulation 4/21 but near immediate replacement in setting of shock/bleeding/atrial fibrillation; was briefly on mechanical  vent support on 4/21, but back to trach collar on 4/22 Would changed to cuffless trach at this point and work with SLP for speaking valve Would hold off on decannulating him though until he is up moving around more given high risk for decompensation  PAF  Systolic heart failure  ICD in place IV amiodarone per  cardiology Tele Per cards  Fever: persistent: broad ddx, probably abdominal related given CT from last week, consider fungemia, nosocomial, non-infectious causes as well Per primary service  No small bowel TPN  AKI now on HD HD per renal   Daily Goals Checklist  Per primary Code Status: Currently full code with continued aggressive management.   Goals of care: would strongly consider palliative and ethics consultation after having another discussion with [facility] transplant surgery.   PCCM will sign off  Roselie Awkward, MD [place] Pager: [phone] Cell: [phone] If no response, please call [phone] until 7pm After 7:00 pm call Elink  [phone]   11/10/2020, 8:41 AM

## 2020-11-10 NOTE — Procedures (Signed)
Tracheostomy Change Note  Patient Details:   Name: [name] DOB: [birth year] MRN: [id number]    Airway Documentation:     Evaluation  O2 sats: stable throughout Complications: No apparent complications Patient did tolerate procedure well. Bilateral Breath Sounds: Diminished    Gonzella Lex 11/10/2020, 2:32 PM

## 2020-11-10 NOTE — Progress Notes (Signed)
Unable to hang TPN at this time. Will have to declot line. Instilling TPA at this time. Fran Lowes, RN VAST

## 2020-11-10 NOTE — Progress Notes (Addendum)
Inpatient Rehabilitation Admissions Coordinator  Patient currently not a candidate for CIR admit. We will sign off at this time. Please re consult [place] if patient improves tolerance with therapies and once medically appropriate. I have alerted Rulon Sera, SWs with [facility] team.  Danne Baxter, RN, MSN Rehab Admissions Coordinator [phone] 11/10/2020 12:08 PM

## 2020-11-10 NOTE — Progress Notes (Signed)
[facility] NEPHROLOGY PROGRESS NOTE  Assessment/ Plan: Pt is a 50 y.o. yo male with neuroendocrine tumor status post Whipple procedure right colectomy and SMV repair, developed AKI on dialysis.  #Acute kidney injury likely ischemic ATN with hypotensive episode requiring Levophed in the setting of very complicated surgical history and multiple visits to the OR, DIC, diffuse small bowel necrosis.  No evidence of GFR recovery so far.  He required CRRT from 4/9-10/2009.  Since then undergoing intermittent hemodialysis TTS schedule.  Plan for HD 5/3: 3.5h, 2K, 400/600, Temp R IJ, no heparin, IVB alb prn IDH  #Acute respiratory failure required mechanical ventilation. s/p trach on 4/8 on TC at this time  #Neuroendocrine tumor status post Whipple procedure with extensive small bowel necrosis status post resection.  Currently on TPN.  General surgery and IR  following.  #Severe protein calorie malnutrition, hypoalbuminemia on TPN.  #Hypokalemia: Managed with dialysis and TPN.    #Anemia: Hemoglobin stable.  Transfuse PRBC as needed.  #Hypotension: Monitor BP, asymptomatic, he is npo and on TPN so unable to receive midodrine.  May need pressors.  Subjective:   No overnight events  Brothers Obas and Abdullah at bedside, answered questions, updated  Pt w/o questions  UOP 0.2L  Delta SCr and BUN do not suggest GFR recovery  Objective Vital signs in last 24 hours: Vitals:   11/10/20 0845 11/10/20 0855 11/10/20 0900 11/10/20 0930  BP: (!) 84/51  (!) 84/47 (!) 86/44  Pulse: 81 80 81 80  Resp: (!) 29 (!) 30 (!) 27 (!) 28  Temp:      TempSrc:      SpO2: 97% 96% 97% 97%  Weight:      Height:       Weight change:   Intake/Output Summary (Last 24 hours) at 11/10/2020 1031 Last data filed at 11/10/2020 0900 Gross per 24 hour  Intake 4180.37 ml  Output 1865 ml  Net 2315.37 ml       Labs: Basic Metabolic Panel: Recent Labs  Lab 11/08/20 0559 11/09/20 0500  11/10/20 0501  NA 138 135 135  136  K 3.9 3.4* 4.3  4.3  CL 99 97* 100  100  CO2 23 28 21*  22  GLUCOSE 171* 143* 143*  145*  BUN 109* 39* 85*  85*  CREATININE 5.63* 2.63* 4.93*  4.94*  CALCIUM 8.0* 8.0* 7.9*  8.0*  PHOS 2.5 1.4* 4.0  3.9   Liver Function Tests: Recent Labs  Lab 11/06/20 0651 11/07/20 0522 11/08/20 0559 11/09/20 0500 11/10/20 0501  AST 143*  --   --   --  83*  ALT 164*  --   --   --  142*  ALKPHOS 98  --   --   --  66  BILITOT 2.9*  --   --   --  3.7*  PROT 6.5  --   --   --  6.7  ALBUMIN 1.2*   < > 1.2* 1.6* 1.4*  1.3*   < > = values in this interval not displayed.   No results for input(s): LIPASE, AMYLASE in the last 168 hours. No results for input(s): AMMONIA in the last 168 hours. CBC: Recent Labs  Lab 11/06/20 0734 11/07/20 0522 11/08/20 0559 11/09/20 0500 11/10/20 0501  WBC 9.7 9.2 8.8 8.8 8.6  NEUTROABS  --   --   --   --  6.3  HGB 9.8* 9.4* 9.1* 8.4* 8.5*  HCT 29.0* 28.3* 28.3* 25.2* 25.9*  MCV 82.6 80.9 82.7 78.8* 80.2  PLT 232 189 201 210 192   Cardiac Enzymes: No results for input(s): CKTOTAL, CKMB, CKMBINDEX, TROPONINI in the last 168 hours. CBG: Recent Labs  Lab 11/09/20 1326 11/09/20 1516 11/09/20 1829 11/09/20 1924 11/10/20 0144  GLUCAP 120* 91 133* 153* 175*    Iron Studies: No results for input(s): IRON, TIBC, TRANSFERRIN, FERRITIN in the last 72 hours. Studies/Results: No results found.  Medications: Infusions: . sodium chloride 250 mL (10/23/20 0825)  . sodium chloride Stopped (11/08/20 1119)  . sodium chloride    . sodium chloride 0 mL (11/10/20 0701)  . sodium chloride    . amiodarone 60 mg/hr (11/10/20 0955)  . piperacillin-tazobactam (ZOSYN)  IV Stopped (11/10/20 0538)  . promethazine (PHENERGAN) injection (IM or IVPB) Stopped (11/10/20 0016)  . TPN CYCLIC-ADULT (ION)    . TPN CYCLIC-ADULT (ION) 20 mL/hr at 11/10/20 0900  . TPN CYCLIC-ADULT (ION)      Scheduled Medications: . chlorhexidine   15 mL Mouth Rinse BID  . Chlorhexidine Gluconate Cloth  6 each Topical Q0600  . fentaNYL  1 patch Transdermal Q72H  . heparin injection (subcutaneous)  5,000 Units Subcutaneous Q8H  . insulin aspart  0-9 Units Subcutaneous 4 times per day  . lidocaine  5 mL Intradermal Once  . LORazepam      . mouth rinse  15 mL Mouth Rinse q12n4p  . pantoprazole (PROTONIX) IV  40 mg Intravenous BID  . sodium chloride flush  10-40 mL Intracatheter Q12H  . sodium chloride flush  5 mL Intracatheter Q8H    have reviewed scheduled and prn medications.  Physical Exam: General: Lying in bed, tracheostomy Heart:RRR, s1s2 nl Lungs: Clear anteriorly Abdomen:soft, non-distended Extremities: Trace LE edema Dialysis Access: IJ HD temp catheter in place.  Nkenge Sonntag B Danell Verno 11/10/2020,10:31 AM  LOS: 49 days

## 2020-11-10 NOTE — Progress Notes (Signed)
Physical Therapy Treatment Patient Details Name: [name] MRN: [id number] DOB: 13-Jul-1970 Today's Date: 11/10/2020    History of Present Illness 50 yo male presenting 3/14 with neuroendocrine tumor of the head of the pancrease. s/p right hemicolectomy, end ileostomy, Whipple and patch angioplasty repair of SMV. postoperative hemorrhagic shock with DIC with extensive small bowel necrosis. All of small bowel has been resected, with external drainage of the bile duct, pancreas and stomach. Extubated 3/30. Reintubated 4/5. S/p tracheostomy 4/8. Started on CRRT 4/9. Started on trach collar trials 4/10.  Discontinued CRRT 4/11. PMH including AICD, heart failure, HTN, and dyslipidemia.    PT Comments    The pt was eager to participate in therapy session this afternoon. He demos good engagement with BLE exercises focusing on improved ROM, strength, and positioning at hips, knees, and ankles. The pt and his brothers were educated on positioning and use of HEP exercises to maintain functional strength and positioning while the pt remains in bed. The pt was able to complete rolling and transition to sitting EOB with modA of 2 to manage lines and elevate trunk from [facility]. The pt's BP remained stable in sitting position, but the pt reported dizziness and requested immediate return to supine due to dizziness and fatigue. The pt was left in the chair position to work on upright tolerance, will continue to progress with OOB mobility as tolerated by the pt in future sessions.     Follow Up Recommendations  CIR     Equipment Recommendations  Wheelchair (measurements PT);Wheelchair cushion (measurements PT);Hospital bed;Rolling walker with 5" wheels;3in1 (PT)    Recommendations for Other Services       Precautions / Restrictions Precautions Precautions: Fall Precaution Comments: map>60; trach collar with PMV, x2 JP drains, x2 biliary drains, wound vac Restrictions Weight Bearing Restrictions: No     Mobility  Bed Mobility Overal bed mobility: Needs Assistance Bed Mobility: Rolling;Sidelying to Sit;Sit to Sidelying Rolling: Min assist Sidelying to sit: Mod assist;+2 for physical assistance     Sit to sidelying: Mod assist;+2 for physical assistance General bed mobility comments: pt required minA to initiate and complete roll, able to reach some with RUE, pulling on bed rail, but assist to complete movement.modA to complete transition to sitting EOB to elevate trunk from [place] transfer comment: pt declined further transfers and mobility, asking to return to supine immediately after coming to sit EOB. BP stable in sitting but pt reporting significant dizziness      Balance Overall balance assessment: Needs assistance Sitting-balance support: No upper extremity supported;Feet supported Sitting balance-[name]: Fair Sitting balance - Comments: minG to minA to maintain static sitting EOB.                                    Cognition Arousal/Alertness: Awake/alert Behavior During Therapy: WFL for tasks assessed/performed Overall Cognitive Status: Difficult to assess Area of Impairment: Problem solving;Following commands                       Following Commands: Follows multi-step commands inconsistently;Follows one step commands with increased time     Problem Solving: Decreased initiation;Difficulty sequencing;Requires verbal cues;Requires tactile cues General Comments: pt attempting to follow commands consistently, but requires increased cue and tactile cues at times to follow commands  correctly.      Exercises General Exercises - Lower Extremity Ankle Circles/Pumps: AROM;Both;5 reps;Supine Short Arc Quad: AROM;Both;10 reps;Supine Heel Slides: AROM;Both;10 reps;Supine    General Comments General comments (skin integrity, edema, etc.): BP: 80/50 (60) supine, 83/52 (61) post BLE ROM, 84/59 (69)  sititng EOB, 77/47 (57) supine rolling in bed, 89/47 (59) sitting in bed at 40*, and 86/56 (67) sitting in bed at 40* after 89min.      Pertinent Vitals/Pain Pain Assessment: Faces Faces Pain Scale: Hurts even more Pain Location: abdomen Pain Descriptors / Indicators: Discomfort;Grimacing;Guarding Pain Intervention(s): Limited activity within patient's tolerance;Monitored during session;Repositioned           PT Goals (current goals can now be found in the care plan section) Acute Rehab PT Goals Patient Stated Goal: agreeable to session, indicates desire to walk PT Goal Formulation: With patient Time For Goal Achievement: Dec 14, 2020 Potential to Achieve Goals: Fair Progress towards PT goals: Progressing toward goals    Frequency    Min 3X/week      PT Plan Current plan remains appropriate    Co-evaluation PT/OT/SLP Co-Evaluation/Treatment: Yes Reason for Co-Treatment: For patient/therapist safety;To address functional/ADL transfers;Complexity of the patient's impairments (multi-system involvement) PT goals addressed during session: Mobility/safety with mobility;Balance;Strengthening/ROM        AM-PAC PT "6 Clicks" Mobility   Outcome Measure  Help needed turning from your back to your side while in a flat bed without using bedrails?: A Little Help needed moving from lying on your back to sitting on the side of a flat bed without using bedrails?: A Little Help needed moving to and from a bed to a chair (including a wheelchair)?: A Little Help needed standing up from a chair using your arms (e.g., wheelchair or bedside chair)?: A Little Help needed to walk in hospital room?: A Lot Help needed climbing 3-5 steps with a railing? : Total 6 Click Score: 15    End of Session Equipment Utilized During Treatment: Oxygen Activity Tolerance: Patient tolerated treatment well Patient left: with call bell/phone within reach;with family/visitor present;in bed;with bed alarm  set Nurse Communication: Mobility status (BP) PT Visit Diagnosis: Muscle weakness (generalized) (M62.81);Difficulty in walking, not elsewhere classified (R26.2);Pain;Unsteadiness on feet (R26.81);Other abnormalities of gait and mobility (R26.89) Pain - part of body:  (generalized)     Time: 1017-5102 PT Time Calculation (min) (ACUTE ONLY): 29 min  Charges:  $Therapeutic Activity: 8-22 mins                     Karma Ganja, PT, DPT   Acute Rehabilitation Department Pager #: [phone]   Otho Bellows 11/10/2020, 3:41 PM

## 2020-11-10 NOTE — Progress Notes (Signed)
24 Days Post-Op  Subjective: Febrile yesterday and overnight, antibiotics started yesterday afternoon and blood cultures sent. Minimal output from midline since left sided drain was repositioned.  Objective: Vital signs in last 24 hours: Temp:  [98.8 F (37.1 C)-102.4 F (39.1 C)] 98.8 F (37.1 C) (05/02 0730) Pulse Rate:  [77-87] 82 (05/02 0730) Resp:  [20-33] 33 (05/02 0730) BP: (80-98)/(33-66) 84/46 (05/02 0730) SpO2:  [93 %-100 %] 97 % (05/02 0730) FiO2 (%):  [28 %] 28 % (05/02 0415) Last BM Date:  (pta)  Intake/Output from previous day: 05/01 0701 - 05/02 0700 In: 4397 [I.V.:3639.9; IV Piggyback:757.1] Out: 1865 [Urine:220; Drains:1645] Intake/Output this shift: No intake/output data recorded.  PE: General: resting comfortably, NAD Neuro: alert and oriented  HEENT: trach in place, site is clean Resp: normal work of breathing on trach collar CV: RRR Abdomen: soft, nondistended, vac in place on midline wound draining blood-tinged fluid. RUQ JP x1 with old blood and bile-tinged fluid. Biliary drain with bile. LUQ perc drain to gravity drainage with cloudy brown fluid. Pancreatic stent draining clear colorless fluid. G tube in place to gravity drainage, draining clear nonbloody gastric contents. Extremities: warm and well-perfused  Lab Results:  Recent Labs    11/09/20 0500 11/10/20 0501  WBC 8.8 8.6  HGB 8.4* 8.5*  HCT 25.2* 25.9*  PLT 210 192   BMET Recent Labs    11/09/20 0500 11/10/20 0501  NA 135 135  136  K 3.4* 4.3  4.3  CL 97* 100  100  CO2 28 21*  22  GLUCOSE 143* 143*  145*  BUN 39* 85*  85*  CREATININE 2.63* 4.93*  4.94*  CALCIUM 8.0* 7.9*  8.0*   PT/INR Recent Labs    11/09/20 0500 11/10/20 0501  LABPROT 16.9* 16.6*  INR 1.4* 1.3*   CMP     Component Value Date/Time   NA 135 11/10/2020 0501   NA 136 11/10/2020 0501   NA 142 07/21/2020 0921   K 4.3 11/10/2020 0501   K 4.3 11/10/2020 0501   CL 100 11/10/2020 0501   CL  100 11/10/2020 0501   CO2 21 (L) 11/10/2020 0501   CO2 22 11/10/2020 0501   GLUCOSE 143 (H) 11/10/2020 0501   GLUCOSE 145 (H) 11/10/2020 0501   BUN 85 (H) 11/10/2020 0501   BUN 85 (H) 11/10/2020 0501   BUN 16 07/21/2020 0921   CREATININE 4.93 (H) 11/10/2020 0501   CREATININE 4.94 (H) 11/10/2020 0501   CREATININE 1.16 03/01/2016 1607   CALCIUM 7.9 (L) 11/10/2020 0501   CALCIUM 8.0 (L) 11/10/2020 0501   PROT 6.7 11/10/2020 0501   PROT 6.7 07/21/2020 0921   ALBUMIN 1.4 (L) 11/10/2020 0501   ALBUMIN 1.3 (L) 11/10/2020 0501   ALBUMIN 4.1 07/21/2020 0921   AST 83 (H) 11/10/2020 0501   ALT 142 (H) 11/10/2020 0501   ALKPHOS 66 11/10/2020 0501   BILITOT 3.7 (H) 11/10/2020 0501   BILITOT 0.8 07/21/2020 0921   GFRNONAA 14 (L) 11/10/2020 0501   GFRNONAA 14 (L) 11/10/2020 0501   GFRNONAA >89 09/22/2015 1008   GFRAA 82 07/21/2020 0921   GFRAA >89 09/22/2015 1008   Lipase     Component Value Date/Time   LIPASE 23 08/22/2020 0318       Assessment/Plan - Continue antibiotics for persistent fevers, source of fever is most likely intraabdominal but pneumonia is also a possibility. Blood cultures pending. - NPO, TPN, ice chips for comfort - PT/OT - Amio  gtt, appreciate cardiology recommendations - Fentanyl patch for pain, with prn IV fentanyl for breakthrough pain - VTE: SQH - Dispo: ICU   LOS: 49 days    Michaelle Birks, MD [facility] Surgery General, Hepatobiliary and Pancreatic Surgery 11/10/20 8:04 AM

## 2020-11-10 NOTE — Progress Notes (Signed)
Blood pressures continue to be soft today, Dr. Zenia Resides ok as long as asymptomatic.  Will continue to monitor.

## 2020-11-10 NOTE — Progress Notes (Signed)
Dr. Zenia Resides paged regarding temp 102.1 F axillary and blood pressures staying soft.  Last BP 84/49 (59).  Ice packs for fever and as long as pt asymptomatic we will continue to monitor blood pressures per Dr. Zenia Resides.

## 2020-11-10 NOTE — Progress Notes (Signed)
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 Zenia Resides; complicated by hemorrhagic shockwith DIC and subsequent diffuse small bowel necrosis s/p small bowel resection 12/11/930; further complication by intra-abdominal fluid collections/p left lateral abdominal drain placement in IR 10/21/2020; s/p left lateral abdominal drain exchange/revision/upsize in IR 11/07/2020. Patient laying in bed resting comfortably, tracheostomy in place. He opens eyes to voice and nods to yes/no questions appropriately. Left lateral abdominal drain site c/d/i.   Allergies: Ace inhibitors  Medications: Prior to Admission medications   Medication Sig Start Date End Date Taking? Authorizing Provider  acetaminophen (TYLENOL) 500 MG tablet Take 2 tablets (1,000 mg total) by mouth every 6 (six) hours. Patient taking differently: Take 1,000 mg by mouth every 6 (six) hours as needed for mild pain. 08/22/20  Yes [name], MD  allopurinol (ZYLOPRIM) 100 MG tablet Take one tablet by mouth daily for 1 week and then 2 tablets by mouth daily Patient taking differently: Take 200 mg by mouth daily. 07/03/20  Yes Swords, Darrick Penna, MD  amiodarone (PACERONE) 200 MG tablet Take 1 tablet (200 mg total) by mouth daily. Patient taking differently: Take 200 mg by mouth daily with supper. 07/30/20  Yes Evans Lance, MD  aspirin 81 MG tablet Take 1 tablet (81 mg total) by mouth 2 (two) times daily. Patient taking differently: Take 81 mg by mouth daily. 09/22/15  Yes Funches, Josalyn, MD  bisoprolol (ZEBETA) 10 MG tablet Take 1 tablet by mouth once daily Patient taking differently: Take 10 mg by mouth daily. 08/25/20  Yes Evans Lance, MD  budesonide-formoterol [facility]) 80-4.5 MCG/ACT inhaler Inhale 2  puffs into the lungs 2 (two) times daily. Must keep upcoming office visit for refills Patient taking differently: Inhale 2 puffs into the lungs 2 (two) times daily as needed (for flares). 11/09/19  Yes Fulp, Cammie, MD  sacubitril-valsartan (ENTRESTO) 24-26 MG Take 1 tablet by mouth 2 (two) times daily. 06/25/20  Yes Evans Lance, MD  amiodarone (PACERONE) 200 MG tablet TAKE 1 TABLET (200 MG TOTAL) BY MOUTH DAILY. 07/30/20 07/30/21  Evans Lance, MD  amiodarone (PACERONE) 200 MG tablet TAKE 1 TABLET (200 MG TOTAL) BY MOUTH DAILY. 11/15/19 11/14/20  Evans Lance, MD  bisoprolol (ZEBETA) 10 MG tablet TAKE 1 TABLET (10 MG TOTAL) BY MOUTH DAILY. 11/15/19 11/14/20  Evans Lance, MD  oxyCODONE (OXY IR/ROXICODONE) 5 MG immediate release tablet Take 1-2 tablets (5-10 mg total) by mouth every 6 (six) hours as needed for severe pain. Patient not taking: Reported on 09/10/2020 08/22/20   [name], MD     Vital Signs: BP (!) 84/55   Pulse 80   Temp (!) 102.1 F (38.9 C) (Axillary)   Resp (!) 26   Ht 6\' 2"  (1.88 m)   Wt 211 lb 10.3 oz (96 kg)   SpO2 96%   BMI 27.17 kg/m   Physical Exam Vitals and nursing note reviewed.  Constitutional:      General: He is not in acute distress.    Comments: Tracheostomy.  Pulmonary:     Effort: Pulmonary effort is normal. No respiratory distress.     Comments: Tracheostomy. Abdominal:     Comments: (+) midline wound, woundvac in place. (+) one right surgical drain, one left surgical drain, gastrostomy tube  to gravity.  Left lateral abdominal drain site without erythema, drainage, or active bleeding; approximately 75 cc output of thick amber fluid in gravity bag.  Skin:    General: Skin is warm and dry.  Neurological:     Mental Status: He is alert.     Comments: Tracheostomy. Nods appropriately to yes/no questions.     Imaging: CT ABDOMEN PELVIS WO CONTRAST  Result Date: 11/07/2020 CLINICAL DATA:  Midline wound drainage a a EXAM: CT ABDOMEN  AND PELVIS WITHOUT CONTRAST TECHNIQUE: Multidetector CT imaging of the abdomen and pelvis was performed following the standard protocol without IV contrast. COMPARISON:  10/21/2020 FINDINGS: Lower chest: Small left pleural effusion. Bibasilar atelectasis, right greater than left. Pacemaker leads seen within the right ventricle toward the apex. Mild global cardiomegaly. No pericardial effusion. Gynecomastia noted. Hepatobiliary: The gallbladder has been resected. A retrograde biliary drainage CT extends from the common duct percutaneously through the right upper quadrant abdominal wall. No intra or extrahepatic biliary ductal dilation. The liver is unremarkable. Decreasing right subhepatic loculated fluid collection, now measuring 1.7 x 4.7 cm (previously measuring 2.6 x 6.0 cm). Pancreas: Resection of the pancreatic head has been performed with a percutaneous retrograde pancreatic drainage catheter seen extending from the main pancreatic duct through the left upper quadrant abdominal wall. I do not clearly identify the pancreatico enteric anastomosis on this noncontrast examination. Spleen: Progressive splenomegaly with the spleen measuring 16.3 cm in greatest dimension. Adrenals/Urinary Tract: The adrenal glands are unremarkable. Asymmetric atrophy of the right kidney is unchanged. There is compensatory hypertrophy of the left kidney. No hydronephrosis. No intrarenal or ureteral calculi. The bladder is unremarkable. Stomach/Bowel: Balloon retention gastrostomy is seen within the distal stomach. The distal margin of the stomach appears stapled. I do not clearly identify the gastrojejunostomy. Surgical drainage catheters are again seen within the subhepatic space. Since the prior examination, a percutaneous drainage catheter has been placed via the left flank through the anterior pararenal space into the previously noted retroperitoneal hematoma. Much of the small bowel has been resected. No identifiable residual  small-bowel is seen on this noncontrast examination. The distal transverse, descending, and rectosigmoid colon remain. Since the prior examination, there has developed extensive loculated gas and fluid within the a surgical bed of the duodenal resection. This may relate to enteric communication/enteric leak, flushing of the drainage catheters with incomplete drainage, or, less likely, gas-forming organism. There is new hyperdensity within this collection, best appreciated on image # 56, in keeping with a hematoma. This curvilinear collection is difficult to measure due to its configuration, but measures at least 4.1 x 16.0 cm at axial image # 49 within the a resection bed of the second and third portion of the duodenum and measures at least 2.6 x 9.1 cm antral laterally in the expected resection bed of the small bowel. Gas from the inferior aspect of this collection appears to extend into the midline incision and likely accounts for the reported wound drainage. Separate loculated collection is seen within the deep pelvis measuring 4.3 x 4.7 cm at axial image # 81. No gross free intraperitoneal gas. Vascular/Lymphatic: The mesenteric venous structures are not well appreciated on this noncontrast examination. The abdominal vasculature is otherwise unremarkable on this noncontrast examination. Reproductive: Prostate is unremarkable. Other: Subcutaneous infiltration within the left lower quadrant abdominal wall likely relates to subcutaneous injection. There is mild subcutaneous edema involving the flanks most in keeping with mild anasarca. Musculoskeletal: No acute bone abnormality. IMPRESSION: Complex examination. Suspected near complete  resection of the small bowel and right hemicolectomy with the distal colon remaining. Resection of the a duodenum, head of the pancreas, and gallbladder in keeping with Whipple resection. The pancreatico biliary enteric limb is not well appreciated on this noncontrast examination.  Percutaneous drainage of the biliary and pancreatic effluent noted. Repeat imaging with contrast administration, if possible, would better delineate the enteric and vascular structures. Interval percutaneous drainage catheter placement within the retroperitoneal hematoma noted on prior examination within the left anterior pararenal space. Interval development of large complex loculated collection containing extensive interstitial gas and high attenuation material compatible with blood product within the retroperitoneum within the resection bed of the duodenum and small bowel. This may relate to enteric communication, catheter malfunction and incomplete drainage in combination with flushing of the drainage catheters, or, less likely, aggressive infection. This gas and fluid appears to communicate with the laparotomy incision inferiorly. 4.7 cm deep pelvic loculated fluid collection stable from prior examination. Right subhepatic loculated fluid collection appears slightly smaller in the interval. Electronically Signed   By: Fidela Salisbury MD   On: 11/07/2020 03:46   IR Catheter Tube Change  Result Date: 11/10/2020 CLINICAL DATA:  Poor output from retroperitoneal drain with enlarging collection on CT EXAM: RETROPERITONEAL CATHETER INJECTION AND EXCHANGE UNDER FLUOROSCOPY TECHNIQUE: The left retroperitoneal drain catheter and surrounding skin were prepped with betadine, draped in usual sterile fashion. Survey fluoroscopic inspection reveals stable position of the left rib peritoneal retroperitoneal pigtail drain catheter. Injection demonstrates contrast extending across the midline into the poorly marginated large collection. The catheter was cut and exchanged over an Amplatz wire for a new 16 French multi sidehole fall drain, advanced across midline into the central aspect of the residual collection. Contrast injection confirms good positioning and patency. Catheter was flushed. Catheter was secured externally with  0 Prolene suture and placed to gravity drain bag. The patient tolerated the procedure well. IMPRESSION: 1. Technically successful exchange and revision of left Retroperitoneal drain catheter, for a 16 [place] Thal device. Electronically Signed   By: Lucrezia Europe M.D.   On: 11/10/2020 09:26    Labs:  CBC: Recent Labs    11/07/20 0522 11/08/20 0559 11/09/20 0500 11/10/20 0501  WBC 9.2 8.8 8.8 8.6  HGB 9.4* 9.1* 8.4* 8.5*  HCT 28.3* 28.3* 25.2* 25.9*  PLT 189 201 210 192    COAGS: Recent Labs    2020-10-12 1947 09/24/20 0400 09/24/20 1650 10/03/2020 1909 10/07/2020 1624 11/05/20 0656 11/05/20 1835 11/07/20 0522 11/08/20 0559 11/09/20 0500 11/10/20 0501  INR 0.9   < > 1.4* 1.4*   < > 2.2*   < > 1.3* 1.3* 1.4* 1.3*  APTT 35  --  32 39*  --  44*  --   --   --   --   --    < > = values in this interval not displayed.    BMP: Recent Labs    12/03/19 1703 05/24/20 1956 07/03/20 1643 07/21/20 0921 08/21/20 1037 11/07/20 0522 11/08/20 0559 11/09/20 0500 11/10/20 0501  NA 144   < > 142 142   < > 136 138 135 135  136  K 4.6   < > 3.7 4.2   < > 3.3* 3.9 3.4* 4.3  4.3  CL 109*   < > 103 104   < > 98 99 97* 100  100  CO2 21   < > 25 27   < > 27 23 28  21*  22  GLUCOSE 103*   < >  105* 83   < > 126* 171* 143* 143*  145*  BUN 15   < > 18 16   < > 61* 109* 39* 85*  85*  CALCIUM 9.5   < > 9.1 9.5   < > 7.9* 8.0* 8.0* 7.9*  8.0*  CREATININE 1.27   < > 0.96 1.19   < > 3.95* 5.63* 2.63* 4.93*  4.94*  GFRNONAA 66   < > 92 71   < > 18* 12* 29* 14*  14*  GFRAA 77  --  107 82  --   --   --   --   --    < > = values in this interval not displayed.    LIVER FUNCTION TESTS: Recent Labs    11/01/20 0900 11/02/20 0455 11/03/20 0519 11/04/20 0553 11/06/20 0651 11/07/20 0522 11/08/20 0559 11/09/20 0500 11/10/20 0501  BILITOT 2.8*  --  2.7*  --  2.9*  --   --   --  3.7*  AST 116*  --  103*  --  143*  --   --   --  83*  ALT 121*  --  126*  --  164*  --   --   --  142*  ALKPHOS  87  --  108  --  98  --   --   --  66  PROT 6.1*  --  6.4*  --  6.5  --   --   --  6.7  ALBUMIN 1.2*   < > 1.2*   < > 1.2* 1.1* 1.2* 1.6* 1.4*  1.3*   < > = values in this interval not displayed.    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 Zenia Resides; complicated by hemorrhagic shockwith DIC and subsequent diffuse small bowel necrosis s/p small bowel resection [id number]; further complication by intra-abdominal fluid collections/p left lateral abdominal drain placement in IR 10/21/2020; s/p left lateral abdominal drain exchange/revision/upsize in IR 11/07/2020. Left lateral abdominal drain stable with approximately 75 cc output thick amber fluid in gravity bag (additional 535 cc output from drain in past 24 hours per chart). Continue current drain management- continue with Qshift flushes/monitor of output. Plan for repeat CT/possible drain injection when output <10 cc/day (assess for possible removal). Further plans per CCS/CCM- appreciate and agree with management. IR to follow.   Electronically Signed: Earley Abide, PA-C 11/10/2020, 12:07 PM   I spent a total of 15 Minutes at the the patient's bedside AND on the patient's [facility] floor or unit, greater than 50% of which was counseling/coordinating care for intra-abdominal fluid collection s/p left lateral abdominal drain placement.

## 2020-11-10 NOTE — Progress Notes (Signed)
Interval cardiology note:  Patient not seen. Telemetry reviewed, chart reviewed.  He is maintaining sinus rhythm on IV amiodarone. Continue IV amiodarone at this time.   Cardiology will follow from [place]. Please contact our service if further goals of care are established for next steps in recommendations.  Elouise Munroe, MD 9:34 AM 11/10/20

## 2020-11-10 NOTE — Progress Notes (Signed)
Occupational Therapy Treatment Patient Details Name: [name] MRN: [id number] DOB: 1971/01/20 Today's Date: 11/10/2020    History of present illness 50 yo male presenting 3/14 with neuroendocrine tumor of the head of the pancrease. s/p right hemicolectomy, end ileostomy, Whipple and patch angioplasty repair of SMV. postoperative hemorrhagic shock with DIC with extensive small bowel necrosis. All of small bowel has been resected, with external drainage of the bile duct, pancreas and stomach. Extubated 3/30. Reintubated 4/5. S/p tracheostomy 4/8. Started on CRRT 4/9. Started on trach collar trials 4/10.  Discontinued CRRT 4/11. PMH including AICD, heart failure, HTN, and dyslipidemia.   OT comments  Pt continues to present with decreased strength and activity tolerance. Pt participating in [place] at bedlevel; x10 each. Pt requiring Min-Mod A+2 for bed mobility. Once at EOB, pt reporting dizziness and requesting to lay back in bed. BP stable. Repositioning pt into chair position at bed. Educating pt and family (brothers) on AROM at bed level throughout the day. Continue to recommend dc to post acute rehab once medically stable and will continue to follow acutely as admitted.  BP: 80/50 (60) supine, 83/52 (61) post BLE ROM, 84/59 (69) sitting EOB, 77/47 (57) supine rolling in bed, 89/47 (59) sitting in bed at 40*, and 86/56 (67) sitting in bed at 40* after 64min.   Follow Up Recommendations  CIR    Equipment Recommendations  None recommended by OT    Recommendations for Other Services PT consult;Speech consult;Rehab consult    Precautions / Restrictions Precautions Precautions: Fall Precaution Comments: map>60; trach collar with PMV, x2 JP drains, x2 biliary drains, wound vac Restrictions Weight Bearing Restrictions: No       Mobility Bed Mobility Overal bed mobility: Needs Assistance Bed Mobility: Rolling;Sidelying to Sit;Sit to Sidelying Rolling: Min assist Sidelying to  sit: Mod assist;+2 for physical assistance     Sit to sidelying: Mod assist;+2 for physical assistance General bed mobility comments: pt required minA to initiate and complete roll, able to reach some with RUE, pulling on bed rail, but assist to complete movement.modA to complete transition to sitting EOB to elevate trunk from [place] transfer comment: pt declined further transfers and mobility, asking to return to supine immediately after coming to sit EOB. BP stable in sitting but pt reporting significant dizziness    Balance Overall balance assessment: Needs assistance Sitting-balance support: No upper extremity supported;Feet supported Sitting balance-[name]: Fair Sitting balance - Comments: minG to minA to maintain static sitting EOB.                                   ADL either performed or assessed with clinical judgement   ADL Overall ADL's : Needs assistance/impaired                                       General ADL Comments: Pt performing AAROM of BLEs and then bed mobility. Once at EOB, pt complaining of head spinning and requesting to return to supine. BP stable in sitting (actually looking better in sitting).     Vision       Perception     Praxis      Cognition Arousal/Alertness: Awake/alert Behavior During Therapy:  WFL for tasks assessed/performed Overall Cognitive Status: Difficult to assess Area of Impairment: Problem solving;Following commands                       Following Commands: Follows multi-step commands inconsistently;Follows one step commands with increased time     Problem Solving: Decreased initiation;Difficulty sequencing;Requires verbal cues;Requires tactile cues General Comments: pt attempting to follow commands consistently, but requires increased cue and tactile cues at times to follow commands correctly. Pt seeming fatigue        Exercises  Exercises: General Lower Extremity General Exercises - Lower Extremity Ankle Circles/Pumps: AROM;Both;5 reps;Supine Short Arc Quad: AROM;Both;10 reps;Supine Heel Slides: AROM;Both;10 reps;Supine   Shoulder Instructions       General Comments BP: 80/50 (60) supine, 83/52 (61) post BLE ROM, 84/59 (69) sititng EOB, 77/47 (57) supine rolling in bed, 89/47 (59) sitting in bed at 40*, and 86/56 (67) sitting in bed at 40* after 25min.    Pertinent Vitals/ Pain       Pain Assessment: Faces Faces Pain Scale: Hurts even more Pain Location: abdomen Pain Descriptors / Indicators: Discomfort;Grimacing;Guarding Pain Intervention(s): Monitored during session;Limited activity within patient's tolerance;Repositioned  Home Living                                          Prior Functioning/Environment              Frequency  Min 2X/week        Progress Toward Goals  OT Goals(current goals can now be found in the care plan section)  Progress towards OT goals: Progressing toward goals  Acute Rehab OT Goals Patient Stated Goal: agreeable to session, indicates desire to walk ADL Goals Pt Will Perform Grooming: with min assist;sitting;bed level Pt Will Perform Upper Body Dressing: with min assist;sitting Pt Will Perform Lower Body Dressing: with min assist;sit to/from stand Pt Will Transfer to Toilet: with min assist;with +2 assist;ambulating;bedside commode Additional ADL Goal #1: Pt will perform bed mobility with Mod A +2 in preparation for ADLs Additional ADL Goal #2: Pt will tolerate sitting at EOB for 10 minutes with Supervision in preparation for ADLs  Plan Discharge plan remains appropriate    Co-evaluation    PT/OT/SLP Co-Evaluation/Treatment: Yes Reason for Co-Treatment: For patient/therapist safety;To address functional/ADL transfers PT goals addressed during session: Mobility/safety with mobility;Balance;Strengthening/ROM OT goals addressed during session:  ADL's and self-care      AM-PAC OT "6 Clicks" Daily Activity     Outcome Measure   Help from another person eating meals?: None Help from another person taking care of personal grooming?: A Lot Help from another person toileting, which includes using toliet, bedpan, or urinal?: A Lot Help from another person bathing (including washing, rinsing, drying)?: A Lot Help from another person to put on and taking off regular upper body clothing?: A Lot Help from another person to put on and taking off regular lower body clothing?: A Lot 6 Click Score: 14    End of Session Equipment Utilized During Treatment: Oxygen (via trach)  OT Visit Diagnosis: Unsteadiness on feet (R26.81);Other abnormalities of gait and mobility (R26.89);Muscle weakness (generalized) (M62.81);Pain Pain - part of body:  (Generalized)   Activity Tolerance Patient limited by fatigue   Patient Left in bed;with call bell/phone within reach;with family/visitor present;with SCD's reapplied   Nurse Communication Mobility status  Time: 4103-0131 OT Time Calculation (min): 36 min  Charges: OT General Charges $OT Visit: 1 Visit OT Treatments $Therapeutic Activity: 8-22 mins  [place], OTR/L Acute Rehab Pager: [phone] Office: [place] 11/10/2020, 5:07 PM

## 2020-11-10 NOTE — Progress Notes (Signed)
PHARMACY - TOTAL PARENTERAL NUTRITION CONSULT NOTE  Indication:  Short bowel syndrome  Patient Measurements: (updated) Height: 6\' 2"  (188 cm) Weight: 97.4 kg (214 lb 11.7 oz) IBW/kg (Calculated) : 82.2 TPN AdjBW (KG): 95.3 Body mass index is 27.57 kg/m.  Assessment:  41 YOM with history of mass adjacent to head of the pancreas in 2021. 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  Developed cholecystitis in Feb 2022 and had percutaneous cholecystectomy tube placement.  Presented this admission for neuroendocrine tumor resection with Whipple procedure, also had right total colectomy, colostomy and cholecystectomy on 10/03/2020. SMV was lacerated during procedure and repaired by bovine pericardial patch. Developed hemorrhagic shock with bile leak post-op and underwent placement of biliary T tube with abdominal washout on 3/15 PM. Antimicrobial therapies discontinued 4/20. Pharmacy consulted to manage TPN.   Glucose / Insulin: no hx DM, A1c 5.2%. CBGs 130-150s on TPN, 90-170s off TPN. Utilized 4 unit sSSI in the past 24 hours and 60 units in TPN Electrolytes: s/p HD on 4/30 - K up to 4.3, others WNL Renal: CRRT started 4/9 > 4/11, now on iHD TTS (4K baths) Hepatic: LFTs mildly elevated, Tbili 3.7, albumin 1.4, prealbumin 14.8 >7.8  TG peaked at 740 (3/26) - last value 153 (5/2)  Intake / Output; MIVF: no UOP, drains (5 total): 167ml, net +17.2L GI Imaging: 4/6 CT - pelvic fluid collection concerning for abscess 4/12 CT - no new fluid collections 4/28 CT - new fluid+gas collection in RLQ GI Surgeries / Procedures: 3/14 Whipple procedure with R hemicolectomy and end ileostomy, cholecystectomy 3/15 washout, VAC placement 3/17 washout, VAC replacement.  Necrosis of entire small bowel except for the PB limb and proximal 40-50cm of jejunum 3/19 re-opening laparotomy, abd closure.  Necrotic SB. 3/21 SB resection, take down of choledochojejunal anastomosis and  pancreaticojejunal anastomosis, take down of duodenojejunal anastomosis, placement of externalized biliary drain / Stamm gastrostomy tube, IA washout and placement of intraperitoneal drains 3/30 extubation> 4/8 trach 4/12 left lateral abdominal drain placed by IR  Central access: R IJ CVC placed 3/15; changed to PICC placed 10/03/20 TPN start date: 2020-10-09  Nutritional Goals (RD rec on 4/21): 2800-3000 kCal, 185-210g AA, fluid >2L/day -on lipid days (MWF): 3120 kcal with 205g protein, 528g CHO, 50g SMOF lipid -on NON-lipid days (TThSaSun): 2618kcal, 528g CHO, 205g protein   Current Nutrition:  TPN  Plan:  - Continue cyclic TPN, infuse 9024 mls over 18 hrs: 78 ml/hr x1 hr, 155 ml/hr x 16 hrs, 78 ml/hr x 1hr - Continue 19g/L SMOFlipid on MWF due to elevated TG - TPN provides a weekly avg of 2833 kCal and 205g AA per day, meeting 100% of needs - Electrolytes in TPN: Na 170mEq/L, K 12mEq/L, no Ca, Mg 42mEq/L, Phos 75mmol/L, Cl:Ac 2:1 - Daily multivitamin in TPN. Remove standard trace elements and add back zinc 5mg  and selenium 24mcg. Remove chromium while on RRT. - Custom sensitive SSI schedule 0200, 1300, 1500, 2000 to correspond with cycle times  - Continue 60 units insulin regular in TPN  - Standard TPN labs on Mon/Thurs - Monitor hemodynamic stability.  Hold off on shortening TPN cycle further.  Albertina Parr, PharmD., BCPS, BCCCP Clinical Pharmacist Please refer to [facility] for unit-specific pharmacist

## 2020-11-11 ENCOUNTER — Inpatient Hospital Stay (HOSPITAL_COMMUNITY): Payer: Managed Care, Other (non HMO)

## 2020-11-11 DIAGNOSIS — J9601 Acute respiratory failure with hypoxia: Secondary | ICD-10-CM | POA: Diagnosis not present

## 2020-11-11 DIAGNOSIS — D3A8 Other benign neuroendocrine tumors: Secondary | ICD-10-CM | POA: Diagnosis not present

## 2020-11-11 DIAGNOSIS — N179 Acute kidney failure, unspecified: Secondary | ICD-10-CM | POA: Diagnosis not present

## 2020-11-11 LAB — RENAL FUNCTION PANEL
Albumin: 1.3 g/dL — ABNORMAL LOW (ref 3.5–5.0)
Albumin: 1.5 g/dL — ABNORMAL LOW (ref 3.5–5.0)
Anion gap: 21 — ABNORMAL HIGH (ref 5–15)
Anion gap: 21 — ABNORMAL HIGH (ref 5–15)
BUN: 121 mg/dL — ABNORMAL HIGH (ref 6–20)
BUN: 96 mg/dL — ABNORMAL HIGH (ref 6–20)
CO2: 15 mmol/L — ABNORMAL LOW (ref 22–32)
CO2: 14 mmol/L — ABNORMAL LOW (ref 22–32)
Calcium: 8.1 mg/dL — ABNORMAL LOW (ref 8.9–10.3)
Calcium: 8.3 mg/dL — ABNORMAL LOW (ref 8.9–10.3)
Chloride: 100 mmol/L (ref 98–111)
Chloride: 100 mmol/L (ref 98–111)
Creatinine, Ser: 6.88 mg/dL — ABNORMAL HIGH (ref 0.61–1.24)
Creatinine, Ser: 5.63 mg/dL — ABNORMAL HIGH (ref 0.61–1.24)
GFR, Estimated: 9 mL/min — ABNORMAL LOW (ref >60)
GFR, Estimated: 12 mL/min — ABNORMAL LOW (ref >60)
Glucose, Bld: 336 mg/dL — ABNORMAL HIGH (ref 70–99)
Glucose, Bld: 174 mg/dL — ABNORMAL HIGH (ref 70–99)
Phosphorus: 7.5 mg/dL — ABNORMAL HIGH (ref 2.5–4.6)
Phosphorus: 6.8 mg/dL — ABNORMAL HIGH (ref 2.5–4.6)
Potassium: 6.7 mmol/L (ref 3.5–5.1)
Potassium: 5.7 mmol/L — ABNORMAL HIGH (ref 3.5–5.1)
Sodium: 136 mmol/L (ref 135–145)
Sodium: 135 mmol/L (ref 135–145)

## 2020-11-11 LAB — POCT I-STAT 7, (LYTES, BLD GAS, ICA,H+H)
Acid-base deficit: 16 mmol/L — ABNORMAL HIGH (ref 0.0–2.0)
Acid-base deficit: 16 mmol/L — ABNORMAL HIGH (ref 0.0–2.0)
Acid-base deficit: 11 mmol/L — ABNORMAL HIGH (ref 0.0–2.0)
Bicarbonate: 15.5 mmol/L — ABNORMAL LOW (ref 20.0–28.0)
Bicarbonate: 15.6 mmol/L — ABNORMAL LOW (ref 20.0–28.0)
Bicarbonate: 18.1 mmol/L — ABNORMAL LOW (ref 20.0–28.0)
Calcium, Ion: 1.18 mmol/L (ref 1.15–1.40)
Calcium, Ion: 1.19 mmol/L (ref 1.15–1.40)
Calcium, Ion: 1.14 mmol/L — ABNORMAL LOW (ref 1.15–1.40)
HCT: 29 % — ABNORMAL LOW (ref 39.0–52.0)
HCT: 31 % — ABNORMAL LOW (ref 39.0–52.0)
HCT: 30 % — ABNORMAL LOW (ref 39.0–52.0)
Hemoglobin: 10.5 g/dL — ABNORMAL LOW (ref 13.0–17.0)
Hemoglobin: 9.9 g/dL — ABNORMAL LOW (ref 13.0–17.0)
Hemoglobin: 10.2 g/dL — ABNORMAL LOW (ref 13.0–17.0)
O2 Saturation: 47 %
O2 Saturation: 83 %
O2 Saturation: 38 %
Patient temperature: 98.8
Patient temperature: 99.4
Patient temperature: 99.4
Potassium: 6.7 mmol/L (ref 3.5–5.1)
Potassium: 6.8 mmol/L (ref 3.5–5.1)
Potassium: 6.5 mmol/L (ref 3.5–5.1)
Sodium: 133 mmol/L — ABNORMAL LOW (ref 135–145)
Sodium: 134 mmol/L — ABNORMAL LOW (ref 135–145)
Sodium: 135 mmol/L (ref 135–145)
TCO2: 17 mmol/L — ABNORMAL LOW (ref 22–32)
TCO2: 17 mmol/L — ABNORMAL LOW (ref 22–32)
TCO2: 20 mmol/L — ABNORMAL LOW (ref 22–32)
pCO2 arterial: 63.8 mmHg — ABNORMAL HIGH (ref 32.0–48.0)
pCO2 arterial: 67.6 mmHg (ref 32.0–48.0)
pCO2 arterial: 56.3 mmHg — ABNORMAL HIGH (ref 32.0–48.0)
pH, Arterial: 6.971 — CL (ref 7.350–7.450)
pH, Arterial: 6.996 — CL (ref 7.350–7.450)
pH, Arterial: 7.118 — CL (ref 7.350–7.450)
pO2, Arterial: 39 mmHg — CL (ref 83.0–108.0)
pO2, Arterial: 75 mmHg — ABNORMAL LOW (ref 83.0–108.0)
pO2, Arterial: 30 mmHg — CL (ref 83.0–108.0)

## 2020-11-11 LAB — CBC
HCT: 28.3 % — ABNORMAL LOW (ref 39.0–52.0)
HCT: 29.1 % — ABNORMAL LOW (ref 39.0–52.0)
Hemoglobin: 8.9 g/dL — ABNORMAL LOW (ref 13.0–17.0)
Hemoglobin: 9.3 g/dL — ABNORMAL LOW (ref 13.0–17.0)
MCH: 25.9 pg — ABNORMAL LOW (ref 26.0–34.0)
MCH: 25.8 pg — ABNORMAL LOW (ref 26.0–34.0)
MCHC: 31.4 g/dL (ref 30.0–36.0)
MCHC: 32 g/dL (ref 30.0–36.0)
MCV: 82.3 fL (ref 80.0–100.0)
MCV: 80.8 fL (ref 80.0–100.0)
Platelets: 309 10*3/uL (ref 150–400)
Platelets: 385 10*3/uL (ref 150–400)
RBC: 3.44 MIL/uL — ABNORMAL LOW (ref 4.22–5.81)
RBC: 3.6 MIL/uL — ABNORMAL LOW (ref 4.22–5.81)
RDW: 22.4 % — ABNORMAL HIGH (ref 11.5–15.5)
RDW: 22.3 % — ABNORMAL HIGH (ref 11.5–15.5)
WBC: 24.1 10*3/uL — ABNORMAL HIGH (ref 4.0–10.5)
WBC: 36.7 10*3/uL — ABNORMAL HIGH (ref 4.0–10.5)
nRBC: 0.3 % — ABNORMAL HIGH (ref 0.0–0.2)
nRBC: 1.2 % — ABNORMAL HIGH (ref 0.0–0.2)

## 2020-11-11 LAB — BASIC METABOLIC PANEL
Anion gap: 19 — ABNORMAL HIGH (ref 5–15)
BUN: 117 mg/dL — ABNORMAL HIGH (ref 6–20)
CO2: 16 mmol/L — ABNORMAL LOW (ref 22–32)
Calcium: 8.5 mg/dL — ABNORMAL LOW (ref 8.9–10.3)
Chloride: 100 mmol/L (ref 98–111)
Creatinine, Ser: 6.8 mg/dL — ABNORMAL HIGH (ref 0.61–1.24)
GFR, Estimated: 9 mL/min — ABNORMAL LOW (ref >60)
Glucose, Bld: 273 mg/dL — ABNORMAL HIGH (ref 70–99)
Potassium: 7 mmol/L (ref 3.5–5.1)
Sodium: 135 mmol/L (ref 135–145)

## 2020-11-11 LAB — HEPATIC FUNCTION PANEL
ALT: 156 U/L — ABNORMAL HIGH (ref 0–44)
AST: 178 U/L — ABNORMAL HIGH (ref 15–41)
Albumin: 1.5 g/dL — ABNORMAL LOW (ref 3.5–5.0)
Alkaline Phosphatase: 90 U/L (ref 38–126)
Bilirubin, Direct: 3.4 mg/dL — ABNORMAL HIGH (ref 0.0–0.2)
Indirect Bilirubin: 2.3 mg/dL — ABNORMAL HIGH (ref 0.3–0.9)
Total Bilirubin: 5.7 mg/dL — ABNORMAL HIGH (ref 0.3–1.2)
Total Protein: 8.2 g/dL — ABNORMAL HIGH (ref 6.5–8.1)

## 2020-11-11 LAB — GLUCOSE, CAPILLARY
Glucose-Capillary: 199 mg/dL — ABNORMAL HIGH (ref 70–99)
Glucose-Capillary: 165 mg/dL — ABNORMAL HIGH (ref 70–99)
Glucose-Capillary: 150 mg/dL — ABNORMAL HIGH (ref 70–99)
Glucose-Capillary: 233 mg/dL — ABNORMAL HIGH (ref 70–99)

## 2020-11-11 LAB — MAGNESIUM: Magnesium: 2.8 mg/dL — ABNORMAL HIGH (ref 1.7–2.4)

## 2020-11-11 LAB — BRAIN NATRIURETIC PEPTIDE: B Natriuretic Peptide: 2755.1 pg/mL — ABNORMAL HIGH (ref 0.0–100.0)

## 2020-11-11 MED ORDER — DEXTROSE 50 % IV SOLN
1.0000 | Freq: Once | INTRAVENOUS | Status: AC
  Administered 2020-11-11: 50 mL via INTRAVENOUS
  Filled 2020-11-11: qty 50

## 2020-11-11 MED ORDER — CALCIUM GLUCONATE-NACL 1-0.675 GM/50ML-% IV SOLN
1.0000 g | Freq: Once | INTRAVENOUS | Status: AC
  Administered 2020-11-11: 1000 mg via INTRAVENOUS
  Filled 2020-11-11: qty 50

## 2020-11-11 MED ORDER — PRISMASOL BGK 0/2.5 32-2.5 MEQ/L EC SOLN
Status: DC
  Filled 2020-11-11 (×4): qty 5000

## 2020-11-11 MED ORDER — SODIUM BICARBONATE 8.4 % IV SOLN
100.0000 meq | Freq: Once | INTRAVENOUS | Status: AC
  Administered 2020-11-11: 100 meq via INTRAVENOUS
  Filled 2020-11-11: qty 100

## 2020-11-11 MED ORDER — FENTANYL 2500MCG IN NS 250ML (10MCG/ML) PREMIX INFUSION
50.0000 ug/h | INTRAVENOUS | Status: DC
  Administered 2020-11-11: 25 ug/h via INTRAVENOUS
  Administered 2020-11-13: 50 ug/h via INTRAVENOUS
  Administered 2020-11-14: 100 ug/h via INTRAVENOUS
  Filled 2020-11-11 (×3): qty 250

## 2020-11-11 MED ORDER — SODIUM CHLORIDE 0.9 % FOR CRRT: INTRAVENOUS_CENTRAL | Status: DC | PRN

## 2020-11-11 MED ORDER — AMIODARONE LOAD VIA INFUSION
150.0000 mg | Freq: Once | INTRAVENOUS | Status: AC
  Administered 2020-11-11: 150 mg via INTRAVENOUS
  Filled 2020-11-11: qty 83.34

## 2020-11-11 MED ORDER — LACTATED RINGERS IV BOLUS
1000.0000 mL | Freq: Once | INTRAVENOUS | Status: AC
  Administered 2020-11-11: 1000 mL via INTRAVENOUS

## 2020-11-11 MED ORDER — HYDROCORTISONE NA SUCCINATE PF 100 MG IJ SOLR
100.0000 mg | Freq: Three times a day (TID) | INTRAMUSCULAR | Status: AC
  Administered 2020-11-11 – 2020-11-13 (×6): 100 mg via INTRAVENOUS
  Filled 2020-11-11 (×6): qty 2

## 2020-11-11 MED ORDER — PIPERACILLIN-TAZOBACTAM 3.375 G IVPB 30 MIN
3.3750 g | Freq: Four times a day (QID) | INTRAVENOUS | Status: AC
  Administered 2020-11-11 – 2020-11-22 (×46): 3.375 g via INTRAVENOUS
  Filled 2020-11-11 (×90): qty 50

## 2020-11-11 MED ORDER — VANCOMYCIN HCL 1000 MG/200ML IV SOLN
1000.0000 mg | INTRAVENOUS | Status: DC
  Administered 2020-11-12: 1000 mg via INTRAVENOUS
  Filled 2020-11-11: qty 200

## 2020-11-11 MED ORDER — PIPERACILLIN-TAZOBACTAM 3.375 G IVPB: 3.3750 g | Freq: Four times a day (QID) | INTRAVENOUS | Status: DC

## 2020-11-11 MED ORDER — ORAL CARE MOUTH RINSE
15.0000 mL | OROMUCOSAL | Status: DC
  Administered 2020-11-11 – 2020-11-23 (×113): 15 mL via OROMUCOSAL

## 2020-11-11 MED ORDER — SODIUM CHLORIDE 0.9 % IV SOLN
100.0000 mg | INTRAVENOUS | Status: AC
  Administered 2020-11-12 – 2020-11-15 (×4): 100 mg via INTRAVENOUS
  Filled 2020-11-11 (×4): qty 100

## 2020-11-11 MED ORDER — EPINEPHRINE HCL 5 MG/250ML IV SOLN IN NS
0.5000 ug/min | INTRAVENOUS | Status: DC
  Administered 2020-11-11: 0.5 ug/min via INTRAVENOUS
  Administered 2020-11-12: 4 ug/min via INTRAVENOUS
  Filled 2020-11-11 (×2): qty 250

## 2020-11-11 MED ORDER — PRISMASOL BGK 0/2.5 32-2.5 MEQ/L EC SOLN
Status: DC
  Filled 2020-11-11 (×10): qty 5000

## 2020-11-11 MED ORDER — AMIODARONE HCL IN DEXTROSE 360-4.14 MG/200ML-% IV SOLN
INTRAVENOUS | Status: AC
  Filled 2020-11-11: qty 200

## 2020-11-11 MED ORDER — NOREPINEPHRINE 16 MG/250ML-% IV SOLN
0.0000 ug/min | INTRAVENOUS | Status: DC
  Administered 2020-11-11: 35 ug/min via INTRAVENOUS
  Administered 2020-11-11 (×2): 40 ug/min via INTRAVENOUS
  Administered 2020-11-12: 36 ug/min via INTRAVENOUS
  Administered 2020-11-12: 8 ug/min via INTRAVENOUS
  Filled 2020-11-11 (×5): qty 250

## 2020-11-11 MED ORDER — CHLORHEXIDINE GLUCONATE 0.12% ORAL RINSE (MEDLINE KIT)
15.0000 mL | Freq: Two times a day (BID) | OROMUCOSAL | Status: DC
  Administered 2020-11-11 – 2020-11-24 (×26): 15 mL via OROMUCOSAL

## 2020-11-11 MED ORDER — SODIUM CHLORIDE 0.9 % IV BOLUS
500.0000 mL | Freq: Once | INTRAVENOUS | Status: AC
  Administered 2020-11-11: 500 mL via INTRAVENOUS

## 2020-11-11 MED ORDER — PRISMASOL BGK 0/2.5 32-2.5 MEQ/L EC SOLN
Status: DC
  Filled 2020-11-11 (×7): qty 5000

## 2020-11-11 MED ORDER — VASOPRESSIN 20 UNITS/100 ML INFUSION FOR SHOCK
0.0000 [IU]/min | INTRAVENOUS | Status: DC
  Administered 2020-11-11 – 2020-11-18 (×15): 0.03 [IU]/min via INTRAVENOUS
  Filled 2020-11-11 (×18): qty 100

## 2020-11-11 MED ORDER — SODIUM BICARBONATE 8.4 % IV SOLN
150.0000 meq | Freq: Once | INTRAVENOUS | Status: AC
  Administered 2020-11-11: 150 meq via INTRAVENOUS
  Filled 2020-11-11: qty 100

## 2020-11-11 MED ORDER — INSULIN ASPART 100 UNIT/ML IV SOLN
5.0000 [IU] | Freq: Once | INTRAVENOUS | Status: AC
  Administered 2020-11-11: 5 [IU] via INTRAVENOUS

## 2020-11-11 MED ORDER — ZINC CHLORIDE 1 MG/ML IV SOLN
INTRAVENOUS | Status: AC
  Filled 2020-11-11: qty 2046

## 2020-11-11 MED ORDER — HEPARIN SODIUM (PORCINE) 1000 UNIT/ML DIALYSIS
1000.0000 [IU] | INTRAMUSCULAR | Status: DC | PRN
  Administered 2020-11-24: 3000 [IU] via INTRAVENOUS_CENTRAL
  Filled 2020-11-11: qty 6
  Filled 2020-11-11: qty 5
  Filled 2020-11-11: qty 6

## 2020-11-11 MED ORDER — SODIUM BICARBONATE 8.4 % IV SOLN
50.0000 meq | Freq: Once | INTRAVENOUS | Status: AC
  Administered 2020-11-11: 50 meq via INTRAVENOUS
  Filled 2020-11-11: qty 50

## 2020-11-11 MED ORDER — VANCOMYCIN HCL 2000 MG/400ML IV SOLN
2000.0000 mg | Freq: Once | INTRAVENOUS | Status: AC
  Administered 2020-11-11: 2000 mg via INTRAVENOUS
  Filled 2020-11-11: qty 400

## 2020-11-11 MED ORDER — SODIUM CHLORIDE 0.9 % IV SOLN
200.0000 mg | Freq: Once | INTRAVENOUS | Status: AC
  Administered 2020-11-11: 200 mg via INTRAVENOUS
  Filled 2020-11-11: qty 200

## 2020-11-11 NOTE — Procedures (Signed)
Arterial Catheter Insertion Procedure Note  [name]  [id number]  Jul 17, 1970  Date:11/11/20  Time:6:05 AM    Provider Performing: Renee Pain    Procedure: Insertion of Arterial Line [phone]) with US guidance ([id number])   Indication(s) Blood pressure monitoring and/or need for frequent ABGs  Consent Unable to obtain consent due to emergent nature of procedure.  Anesthesia None   Time Out Verified patient identification, verified procedure, site/side was marked, verified correct patient position, special equipment/implants available, medications/allergies/relevant history reviewed, required imaging and test results available.   Sterile Technique Maximal sterile technique including full sterile barrier drape, hand hygiene, sterile gown, sterile gloves, mask, hair covering, sterile ultrasound probe cover (if used).   Procedure Description Area of catheter insertion was cleaned with chlorhexidine and draped in sterile fashion. With real-time ultrasound guidance an arterial catheter was placed into the left brachial artery.  Appropriate arterial tracings confirmed on monitor.     Complications/Tolerance None; patient tolerated the procedure well.   EBL Minimal   Specimen(s) None   Renee Pain, MD Board Certified by the ABIM, [place]

## 2020-11-11 NOTE — Progress Notes (Signed)
[facility] NEPHROLOGY PROGRESS NOTE  Assessment/ Plan: Pt is a 50 y.o. yo male with neuroendocrine tumor status post Whipple procedure right colectomy and SMV repair, developed AKI on dialysis.  #AKI HD dependent, prolonged since 4/9. ATN.  No evidence of recovery and sig decompensation overnight now acidotic and hyperkalemic. Restart CRRT, All 2K, Net even UF, no anticoagulation to start.  Agree that prognosis has gone from very poor to worse in past 24h, support palliative involvement and GOC with family.   #Acute respiratory failure required mechanical ventilation. Back on Vent, per CCM. [place] HCAP  #Neuroendocrine tumor status post Whipple procedure with extensive small bowel necrosis status post resection.  TPN held overnight.  #Severe protein calorie malnutrition, hypoalbuminemia  #Anemia: Hemoglobin stable.  Transfuse PRBC as needed.  Per CCM and CCS  #Hypotension: back in severe septic shock on 3 pressors.    # Hyperkalemia, as above  # metabolic and resp acidosis, as above.    Subjective:   Resp failure overnight, back on vent, likely new HCAP, bronch this AM  Back in shock on 3 pressors NE VP Epi  K up to 6.7, acidotic  Family at bedside, will have [place] discussion with CCM  Objective Vital signs in last 24 hours: Vitals:   11/11/20 0800 11/11/20 0900 11/11/20 0905 11/11/20 0906  BP: 117/66 (!) 112/53 (!) 100/52   Pulse: 71 69 67   Resp: (!) 32 (!) 26 (!) 31   Temp: (!) 101.6 F (38.7 C)     TempSrc: Axillary     SpO2: 100% 100% 100% 100%  Weight:    109.6 kg  Height:       Weight change:   Intake/Output Summary (Last 24 hours) at 11/11/2020 1015 Last data filed at 11/11/2020 0900 Gross per 24 hour  Intake 3226.83 ml  Output 1380 ml  Net 1846.83 ml       Labs: Basic Metabolic Panel: Recent Labs  Lab 11/09/20 0500 11/10/20 0501 11/11/20 0300 11/11/20 0336 11/11/20 0411 11/11/20 0642 11/11/20 0739  NA 135 135  136 135   < >  134* 135 136  K 3.4* 4.3  4.3 7.0*   < > 6.8* 6.5* 6.7*  CL 97* 100  100 100  --   --   --  100  CO2 28 21*  22 16*  --   --   --  15*  GLUCOSE 143* 143*  145* 273*  --   --   --  336*  BUN 39* 85*  85* 117*  --   --   --  121*  CREATININE 2.63* 4.93*  4.94* 6.80*  --   --   --  6.88*  CALCIUM 8.0* 7.9*  8.0* 8.5*  --   --   --  8.1*  PHOS 1.4* 4.0  3.9  --   --   --   --  7.5*   < > = values in this interval not displayed.   Liver Function Tests: Recent Labs  Lab 11/06/20 0651 11/07/20 0522 11/09/20 0500 11/10/20 0501 11/11/20 0739  AST 143*  --   --  83*  --   ALT 164*  --   --  142*  --   ALKPHOS 98  --   --  66  --   BILITOT 2.9*  --   --  3.7*  --   PROT 6.5  --   --  6.7  --   ALBUMIN 1.2*   < >  1.6* 1.4*  1.3* 1.3*   < > = values in this interval not displayed.   No results for input(s): LIPASE, AMYLASE in the last 168 hours. No results for input(s): AMMONIA in the last 168 hours. CBC: Recent Labs  Lab 11/07/20 0522 11/08/20 0559 11/09/20 0500 11/10/20 0501 11/11/20 0300 11/11/20 0336 11/11/20 0411 11/11/20 0642  WBC 9.2 8.8 8.8 8.6 24.1*  --   --   --   NEUTROABS  --   --   --  6.3  --   --   --   --   HGB 9.4* 9.1* 8.4* 8.5* 8.9* 9.9* 10.5* 10.2*  HCT 28.3* 28.3* 25.2* 25.9* 28.3* 29.0* 31.0* 30.0*  MCV 80.9 82.7 78.8* 80.2 82.3  --   --   --   PLT 189 201 210 192 309  --   --   --    Cardiac Enzymes: No results for input(s): CKTOTAL, CKMB, CKMBINDEX, TROPONINI in the last 168 hours. CBG: Recent Labs  Lab 11/10/20 0144 11/10/20 1302 11/10/20 1524 11/10/20 2000 11/11/20 0155  GLUCAP 175* 118* 88 98 199*    Iron Studies: No results for input(s): IRON, TIBC, TRANSFERRIN, FERRITIN in the last 72 hours. Studies/Results: DG CHEST PORT 1 VIEW  Result Date: 11/11/2020 CLINICAL DATA:  Tracheostomy tube EXAM: PORTABLE CHEST 1 VIEW COMPARISON:  Yesterday FINDINGS: Right IJ line with tip at the upper SVC. Right PICC with tip at the lower SVC.  Tracheostomy tube in place. Single chamber defibrillator lead. Dense airspace disease asymmetric to the right which has progressed. No visible effusion or air leak. Cardiomegaly. IMPRESSION: 1. Worsening airspace disease on the right. 2. No specific findings related to interval tracheostomy tube change. Electronically Signed   By: Monte Fantasia M.D.   On: 11/11/2020 05:02   DG CHEST PORT 1 VIEW  Result Date: 11/10/2020 CLINICAL DATA:  Fever, dyspnea, tracheostomy EXAM: PORTABLE CHEST 1 VIEW COMPARISON:  10/30/2020 chest radiograph. FINDINGS: Tracheostomy tube tip terminates over the tracheal air column at the level of the thoracic inlet. Right internal jugular central venous catheter terminates in the lower third of the SVC. Right PICC terminates at the cavoatrial junction. Single lead left subclavian ICD with lead tip overlying the right ventricle. Stable cardiomediastinal silhouette with mild cardiomegaly. No pneumothorax. Slight blunting of the costophrenic angles bilaterally. Widespread patchy hazy opacities in both lungs, new. IMPRESSION: 1. Well-positioned tracheostomy tube. 2. New widespread patchy hazy opacities in both lungs, suspect multilobar pneumonia, although a component of cardiogenic pulmonary edema is not excluded given the cardiomegaly. 3. Slight blunting of the costophrenic angles, cannot exclude trace pleural effusions. Electronically Signed   By: Ilona Sorrel M.D.   On: 11/10/2020 14:52    Medications: Infusions: . sodium chloride 250 mL (11/10/20 1411)  . sodium chloride Stopped (11/08/20 1119)  . sodium chloride    . sodium chloride 0 mL (11/10/20 0701)  . sodium chloride    . amiodarone Stopped (11/11/20 0246)  . [START ON 11/12/2020] anidulafungin    . anidulafungin 200 mg (11/11/20 0959)  . epinephrine 0.5 mcg/min (11/11/20 0916)  . norepinephrine (LEVOPHED) Adult infusion 40 mcg/min (11/11/20 0900)  . piperacillin-tazobactam    . prismasol BGK 2/2.5 dialysis solution     . prismasol BGK 2/2.5 replacement solution    . prismasol BGK 2/2.5 replacement solution    . promethazine (PHENERGAN) injection (IM or IVPB) Stopped (11/10/20 1538)  . TPN CYCLIC-ADULT (ION) Stopped (11/11/20 1131)  . TPN CYCLIC-ADULT (ION)    . [  START ON 11/12/2020] vancomycin    . vancomycin 2,000 mg (11/11/20 0924)  . vasopressin 0.03 Units/min (11/11/20 0600)    Scheduled Medications: . chlorhexidine  15 mL Mouth Rinse BID  . Chlorhexidine Gluconate Cloth  6 each Topical Q0600  . fentaNYL  1 patch Transdermal Q72H  . heparin injection (subcutaneous)  5,000 Units Subcutaneous Q8H  . hydrocortisone sodium succinate  100 mg Intravenous Q8H  . insulin aspart  0-9 Units Subcutaneous 4 times per day  . lidocaine  5 mL Intradermal Once  . mouth rinse  15 mL Mouth Rinse q12n4p  . pantoprazole (PROTONIX) IV  40 mg Intravenous BID  . sodium chloride flush  10-40 mL Intracatheter Q12H  . sodium chloride flush  5 mL Intracatheter Q8H    have reviewed scheduled and prn medications.  Physical Exam: General: Lying in bed, on vent Heart:RRR, s1s2 nl Lungs: Clear anteriorly Abdomen:soft, non-distended Extremities: Trace LE edema Dialysis Access: IJ HD temp catheter in place.  Hever Solomon [name] 11/11/2020,10:15 AM  LOS: 50 days

## 2020-11-11 NOTE — Progress Notes (Addendum)
Progress Note  Patient Name: Dolphus Jenny Date of Encounter: 11/11/2020  Primary Cardiologist: Cristopher Peru, MD  Subjective   Decline/overnight events reviewed.  Inpatient Medications    Scheduled Meds: . chlorhexidine  15 mL Mouth Rinse BID  . Chlorhexidine Gluconate Cloth  6 each Topical Q0600  . fentaNYL  1 patch Transdermal Q72H  . heparin injection (subcutaneous)  5,000 Units Subcutaneous Q8H  . hydrocortisone sodium succinate  100 mg Intravenous Q8H  . insulin aspart  0-9 Units Subcutaneous 4 times per day  . lidocaine  5 mL Intradermal Once  . mouth rinse  15 mL Mouth Rinse q12n4p  . pantoprazole (PROTONIX) IV  40 mg Intravenous BID  . sodium chloride flush  10-40 mL Intracatheter Q12H  . sodium chloride flush  5 mL Intracatheter Q8H   Continuous Infusions: . sodium chloride 250 mL (11/10/20 1411)  . sodium chloride Stopped (11/08/20 1119)  . sodium chloride    . sodium chloride 0 mL (11/10/20 0701)  . sodium chloride    . amiodarone Stopped (11/11/20 0246)  . [START ON 11/12/2020] anidulafungin    . anidulafungin 200 mg (11/11/20 0959)  . epinephrine 0.5 mcg/min (11/11/20 0916)  . norepinephrine (LEVOPHED) Adult infusion 40 mcg/min (11/11/20 0900)  . piperacillin-tazobactam    . prismasol BGK 2/2.5 dialysis solution 1,500 mL/hr at 11/11/20 1042  . prismasol BGK 2/2.5 replacement solution 1,000 mL/hr at 11/11/20 1043  . prismasol BGK 2/2.5 replacement solution 500 mL/hr at 11/11/20 1043  . promethazine (PHENERGAN) injection (IM or IVPB) Stopped (11/10/20 1538)  . TPN CYCLIC-ADULT (ION) Stopped (11/11/20 1131)  . TPN CYCLIC-ADULT (ION)    . [START ON 11/12/2020] vancomycin    . vancomycin 2,000 mg (11/11/20 0924)  . vasopressin 0.03 Units/min (11/11/20 0600)   PRN Meds: sodium chloride, sodium chloride, sodium chloride, sodium chloride, alteplase, alteplase, diphenhydrAMINE, fentaNYL (SUBLIMAZE) injection, heparin, heparin, heparin, ipratropium-albuterol,  lidocaine (PF), lidocaine (PF), lidocaine-prilocaine, lidocaine-prilocaine, ondansetron (ZOFRAN) IV, pentafluoroprop-tetrafluoroeth, pentafluoroprop-tetrafluoroeth, polyvinyl alcohol, promethazine (PHENERGAN) injection (IM or IVPB), sodium chloride, sodium chloride flush   Vital Signs    Vitals:   11/11/20 0800 11/11/20 0900 11/11/20 0905 11/11/20 0906  BP: 117/66 (!) 112/53 (!) 100/52   Pulse: 71 69 67   Resp: (!) 32 (!) 26 (!) 31   Temp: (!) 101.6 F (38.7 C)     TempSrc: Axillary     SpO2: 100% 100% 100% 100%  Weight:    109.6 kg  Height:        Intake/Output Summary (Last 24 hours) at 11/11/2020 1049 Last data filed at 11/11/2020 0900 Gross per 24 hour  Intake 3226.83 ml  Output 1380 ml  Net 1846.83 ml   Last 3 Weights 11/11/2020 11/06/2020 11/06/2020  Weight (lbs) 241 lb 10 oz 211 lb 10.3 oz 210 lb 12.2 oz  Weight (kg) 109.6 kg 96 kg 95.6 kg     Telemetry    Variety of rhythms seen including sinus bradycardia, WAP, potential AV dissociation and generally rate controlled atrial fib - Personally Reviewed  Physical Exam   GEN: Ill appearing HEENT: Normocephalic, atraumatic, sclera non-icteric. Neck: No JVD or bruits. Cardiac: Irregular, no murmurs, rubs, or gallops.  Respiratory: Coarse BS with ventilator supported breathing GI: Absent BS Extremities: No edema Neuro:  Drowsy on vent with tremulousness Psych: unable to assess  Labs    High Sensitivity Troponin:  No results for input(s): TROPONINIHS in the last 720 hours.    Cardiac EnzymesNo results for input(s): TROPONINI in  the last 168 hours. No results for input(s): TROPIPOC in the last 168 hours.   Chemistry Recent Labs  Lab 11/06/20 7564 11/07/20 0522 11/09/20 0500 11/10/20 0501 11/11/20 0300 11/11/20 0336 11/11/20 0411 11/11/20 0642 11/11/20 0739  NA 136   < > 135 135  136 135   < > 134* 135 136  K 3.1*   < > 3.4* 4.3  4.3 7.0*   < > 6.8* 6.5* 6.7*  CL 96*   < > 97* 100  100 100  --   --   --  100   CO2 25   < > 28 21*  22 16*  --   --   --  15*  GLUCOSE 139*   < > 143* 143*  145* 273*  --   --   --  336*  BUN 117*   < > 39* 85*  85* 117*  --   --   --  121*  CREATININE 6.36*   < > 2.63* 4.93*  4.94* 6.80*  --   --   --  6.88*  CALCIUM 8.4*   < > 8.0* 7.9*  8.0* 8.5*  --   --   --  8.1*  PROT 6.5  --   --  6.7  --   --   --   --   --   ALBUMIN 1.2*   < > 1.6* 1.4*  1.3*  --   --   --   --  1.3*  AST 143*  --   --  83*  --   --   --   --   --   ALT 164*  --   --  142*  --   --   --   --   --   ALKPHOS 98  --   --  66  --   --   --   --   --   BILITOT 2.9*  --   --  3.7*  --   --   --   --   --   GFRNONAA 10*   < > 29* 14*  14* 9*  --   --   --  9*  ANIONGAP 15   < > 10 14  14  19*  --   --   --  21*   < > = values in this interval not displayed.     Hematology Recent Labs  Lab 11/09/20 0500 11/10/20 0501 11/11/20 0300 11/11/20 0336 11/11/20 0411 11/11/20 0642  WBC 8.8 8.6 24.1*  --   --   --   RBC 3.20* 3.23* 3.44*  --   --   --   HGB 8.4* 8.5* 8.9* 9.9* 10.5* 10.2*  HCT 25.2* 25.9* 28.3* 29.0* 31.0* 30.0*  MCV 78.8* 80.2 82.3  --   --   --   MCH 26.3 26.3 25.9*  --   --   --   MCHC 33.3 32.8 31.4  --   --   --   RDW 19.4* 20.5* 22.4*  --   --   --   PLT 210 192 309  --   --   --     BNP Recent Labs  Lab 11/11/20 0300  BNP 2,755.1*     DDimer No results for input(s): DDIMER in the last 168 hours.   Radiology    DG CHEST PORT 1 VIEW  Result Date: 11/11/2020 CLINICAL DATA:  Tracheostomy tube EXAM: PORTABLE CHEST  1 VIEW COMPARISON:  Yesterday FINDINGS: Right IJ line with tip at the upper SVC. Right PICC with tip at the lower SVC. Tracheostomy tube in place. Single chamber defibrillator lead. Dense airspace disease asymmetric to the right which has progressed. No visible effusion or air leak. Cardiomegaly. IMPRESSION: 1. Worsening airspace disease on the right. 2. No specific findings related to interval tracheostomy tube change. Electronically Signed   By:  Monte Fantasia M.D.   On: 11/11/2020 05:02   DG CHEST PORT 1 VIEW  Result Date: 11/10/2020 CLINICAL DATA:  Fever, dyspnea, tracheostomy EXAM: PORTABLE CHEST 1 VIEW COMPARISON:  10/30/2020 chest radiograph. FINDINGS: Tracheostomy tube tip terminates over the tracheal air column at the level of the thoracic inlet. Right internal jugular central venous catheter terminates in the lower third of the SVC. Right PICC terminates at the cavoatrial junction. Single lead left subclavian ICD with lead tip overlying the right ventricle. Stable cardiomediastinal silhouette with mild cardiomegaly. No pneumothorax. Slight blunting of the costophrenic angles bilaterally. Widespread patchy hazy opacities in both lungs, new. IMPRESSION: 1. Well-positioned tracheostomy tube. 2. New widespread patchy hazy opacities in both lungs, suspect multilobar pneumonia, although a component of cardiogenic pulmonary edema is not excluded given the cardiomegaly. 3. Slight blunting of the costophrenic angles, cannot exclude trace pleural effusions. Electronically Signed   By: Ilona Sorrel M.D.   On: 11/10/2020 14:52    Cardiac Studies    2d echo 09/26/20 1. Left ventricular ejection fraction, by estimation, is 40 to 45%. The  left ventricle has mildly decreased function. The left ventricle  demonstrates global hypokinesis. Left ventricular diastolic parameters are  consistent with Grade I diastolic  dysfunction (impaired relaxation).  2. Right ventricular systolic function is normal. The right ventricular  size is normal. Tricuspid regurgitation signal is inadequate for assessing  PA pressure.  3. The mitral valve is grossly normal. Trivial mitral valve  regurgitation.  4. The aortic valve is grossly normal. Aortic valve regurgitation is not  visualized. No aortic stenosis is present.  5. The inferior vena cava is normal in size with greater than 50%  respiratory variability, suggesting right atrial pressure of 3 mmHg.    Comparison(s): A prior study was performed on 05/25/20. Prior images  reviewed side by side. LV function has decreased slightly.    Patient Profile     50 y.o. male with longstanding NICM, chronic systolic CHF, VT/VF with prior cardiac arrest 2011 (nonobstructive CAD by cath at that time - 20-30% ramus otherwise small vessels) s/p STJ ICD, HTN, HLD, neuroendocrine tumor. Underwent Whipple procedure 3/14 with intra-operative SMV laceration and repairby vascular with bovine pericardial patch. Complex hospital course since that time with worsening hemorrhagic shock and anemia, massive transfusion protocol, small bowel necrosis s/p resection, septic shock, tracheostomy, AKI, DIC. On 10/31/19 developed hypotension, AF RVR and 2 inappropriate ICD shocks for rapid atrial fib. Extremely complex hospital stay with multitude of other medical issues.  Assessment & Plan    1. Rapid atrial fib this admission with inappropriate ICD shock 10/30/20 - received inappropriate ICDx2 shock on 10/30/20 so was started on IV amiodarone at that time (future plan considered a challenge given the patient's lack of small bowel and EP concerns about oral absorption) - not on full dose anticoagulation given hemorrhagic shock this admission - per notes, had recurrent acute respiratory failure overnight with mucus plugging, hyperkalemia with AKI in setting of severe respiratory and metabolic acidosis, septic shock/concern for fungemia - amio drip stopped overnight due to  concern for bradycardia - variety of rhythms seen including sinus bradycardia, WAP, potential AV dissociation and generally rate controlled atrial fib - ICD typically has backup pacing capability but since his HR has been relatively controlled <100, per tentative review with our EP team, OK to continue to hold amiodarone at this time, can revisit as clinical scenario evolves - pulmonary critical care is very concerned about prognosis and do not think he will  survive this illness at this point - will await further goals of care discussion, which will then infer whether we need to de-activate his ICD therapies  2. NICM/chronic systolic CHF - last limited echo 4/22 showed EF 50% with severe basal inferior and  inferoseptal hypokinesis, mild MR - remains on pressor support therefore not on GDMT at this time  3. History of VT/VF, h/o ICD - on amiodarone prior to admission, unable to resume oral due to s/b resection and discontinuity of GI tract - device reported to be at [facility] in 4-5 months per earlier notes this admission  4. Neuroendocrine tumor s/p Whipple with complex hospital course - per primary teams  ADDENDUM: extensive [place] discussion had with patient's family and PCCM. Per Dr. Anastasia Pall note, "They voiced understanding and agreed with my suggested plan of changing code status to DNR but maintaining the current level of support which includes dialysis, mechanical ventilation and vasopressors.  We will not escalate care further as we are currently providing maximum doses of multiple vasopressors." I went to speak to family about deactivation of ICD to align with these goals then patient began to have runs of wide complex tachycardia. Per review with EP PA, these appear to be both NSVT and potentially atrial fib with RVR. PCCM ordered 150mg  IV amiodarone. I went back out to speak to family about his defibrillator. At this time, his brother indicates they do not want to deactivate his defibrillator. I explained that this is a similar concept to external defibrillator and he may undergo shocks similar to what he experienced earlier this admission, which is the opposite of DNR. I told him I am concerned that the ICD shock would not meaningfully impact his recovery/survival and may cause suffering. His brother expresses understanding of this and does not want external shocks/CPR, but wishes to keep the defibrillator active. Rounding MD currently in a  procedure so notified DOD Dr. Tamala Julian of the events.  ADDENDUM #2: Oda Kilts, EP PA, and Dr. Lake Bells joined in with family for additional conversations - 2 brothers present and a 3rd brother on the phone whom helps make medical decisions. Through extensive discussion, the family understands that his defibrillator shock may cause more harm and suffering than clinical benefit and would like the ICD therapies deactivated. They would like to revisit the discussion around his defibrillator in 48 hours if his condition improves. Jonni Sanger used programmer at bedside and tachy-therapies are now off. Cardiology will continue to follow.  For questions or updates, please contact [place] Please consult [email or web address] for contact info under Cardiology/STEMI.  Signed, Charlie Pitter, PA-C 11/11/2020, 10:49 AM    Patient seen and examined with Melina Copa PA-C.  Agree as above, with the following exceptions and changes as noted below. Very challenging situation. Bradycardia overnight so amiodarone held, today demonstrated WCT and received IV amiodarone bolus. Family discussions resulted in DNR, and subsequently after extended discussions, ICD therapies turned off, with a trial of 48 hours. If significant improvement, discussion on resuming ICD therapies can be had per family comfort.  Gen: tracheostomy, CV: iRRR, no murmurs, Lungs: clear, Abd: soft, Extrem: Warm, no edema, Neuro/Psych: alert, responds with and motions. All available labs, radiology testing, previous records reviewed. Discuss with Dr. Lake Bells at the bedside. Agree with bolus amiodarone as needed for now. Very complex situation but [place] discussions have been had with family today. We are available to assist as needed. Please see excellent documentation of events as noted above by my colleague Melina Copa.   Elouise Munroe, MD 11/11/20 5:27 PM

## 2020-11-11 NOTE — Progress Notes (Signed)
Attending:   Seen and examined   Subjective: Difficult night Tracheostomy changed Later in the evening developed hypoxemia Septic shock Back on mechanical ventilation On high dose vasopressors  Objective: Vitals:   11/11/20 0400 11/11/20 0500 11/11/20 0600 11/11/20 0800  BP: (!) 91/46 111/61    Pulse: 60 70 64   Resp: (!) 22 (!) 27 (!) 29   Temp: 99.4 F (37.4 C)   (!) 101.6 F (38.7 C)  TempSrc: Oral   Axillary  SpO2: 97% 98% 99%   Weight:      Height:       Vent Mode: PRVC FiO2 (%):  [28 %-100 %] 100 % Set Rate:  [24 bmp] 24 bmp Vt Set:  [650 mL] 650 mL PEEP:  [8 cmH20] 8 cmH20 Plateau Pressure:  [32 cmH20] 32 cmH20  Intake/Output Summary (Last 24 hours) at 11/11/2020 0844 Last data filed at 11/11/2020 0630 Gross per 24 hour  Intake 2598.56 ml  Output 1380 ml  Net 1218.56 ml    General:  In bed on vent HENT: NCAT tracheostomy in place PULM: Crackles on R, clear on left, vent supported breathing CV: tachycardic, no mgr GI: no bowel sounds, purulent drainage from R posterior drain, biliary drain in place, pancreatic stent drain in place, midline jp drain in place, GJ tube in place, wound vac in place MSK: normal bulk and tone Neuro: drowsy, on ventilator   CBC    Component Value Date/Time   WBC 24.1 (H) 11/11/2020 0300   RBC 3.44 (L) 11/11/2020 0300   HGB 10.2 (L) 11/11/2020 0642   HCT 30.0 (L) 11/11/2020 0642   PLT 309 11/11/2020 0300   MCV 82.3 11/11/2020 0300   MCH 25.9 (L) 11/11/2020 0300   MCHC 31.4 11/11/2020 0300   RDW 22.4 (H) 11/11/2020 0300   LYMPHSABS 1.3 11/10/2020 0501   MONOABS 0.4 11/10/2020 0501   EOSABS 0.1 11/10/2020 0501   BASOSABS 0.1 11/10/2020 0501    BMET    Component Value Date/Time   NA 136 11/11/2020 0739   NA 142 07/21/2020 0921   K 6.7 (HH) 11/11/2020 0739   CL 100 11/11/2020 0739   CO2 15 (L) 11/11/2020 0739   GLUCOSE 336 (H) 11/11/2020 0739   BUN 121 (H) 11/11/2020 0739   BUN 16 07/21/2020 0921   CREATININE 6.88  (H) 11/11/2020 0739   CREATININE 1.16 03/01/2016 1607   CALCIUM 8.1 (L) 11/11/2020 0739   GFRNONAA 9 (L) 11/11/2020 0739   GFRNONAA >89 09/22/2015 1008   GFRAA 82 07/21/2020 0921   GFRAA >89 09/22/2015 1008    CXR images personally reviewed: dense consolidation in the right lung throughout, tracheostomy in place, PICC line in place  Impression/Plan: Acute respiratory failure with hypoxemia due to health care associated pneumonia Concern for mucus plugging > back on mechanical ventilation > continue full vent support > pulmonary toilette measures > bronchoscopy today for airway inspection, possible mucus plugging right lung  Hyperkalemia with AKI in setting of severe respiratory and metabolic acidosis, received insulin/d50 this morning > start CVVHD > stop TPN given potassium concentration in it  Septic shock: due to pneumonia but also has multiple other sources including intra-abdominal abscess (klebsiella), likely fungemia > start antifungal coverage with eraxis > add back vanc > continue zosyn for now  It is very unlikely he will survive this illness at this point.  He has no reasonable destination as he is not a transplant candidate and now dependent on multiple forms of life support  which cannot be maintained indefinitely (TPN, Hemodialysis, mechanical ventilation).  Have explained the severity of his condition with his wife and have requested a family visit.    My cc time 35 minutes  Roselie Awkward, MD [place] [facility] Pager: [phone] Cell: [phone] After 3pm or if no response, call [phone]

## 2020-11-11 NOTE — Progress Notes (Signed)
Patient's ventilator alarming, air heard rushing around the trach. Respiratory checked cuff, trach is positional causing patient to breathe rapidly. Dr. Zenia Resides made aware, and CCM as well. Dr. Zenia Resides agreed to a trach exchange. CCM called to bedside to assess.

## 2020-11-11 NOTE — Progress Notes (Signed)
PHARMACY - TOTAL PARENTERAL NUTRITION CONSULT NOTE  Indication:  Short bowel syndrome  Patient Measurements: (updated) Height: 6\' 2"  (188 cm) Weight: 97.4 kg (214 lb 11.7 oz) IBW/kg (Calculated) : 82.2 TPN AdjBW (KG): 95.3 Body mass index is 27.57 kg/m.  Assessment:  37 YOM with history of mass adjacent to head of the pancreas in 2021. 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  Developed cholecystitis in Feb 2022 and had percutaneous cholecystectomy tube placement.  Presented this admission for neuroendocrine tumor resection with Whipple procedure, also had right total colectomy, colostomy and cholecystectomy on 09/17/2020. SMV was lacerated during procedure and repaired by bovine pericardial patch. Developed hemorrhagic shock with bile leak post-op and underwent placement of biliary T tube with abdominal washout on 3/15 PM. Antimicrobial therapies discontinued 4/20. Pharmacy consulted to manage TPN.   Glucose / Insulin: no hx DM, A1c 5.2%. CBGs trended up on TPN Utilized 2 unit sSSI in the past 24 hours and 60 units in TPN Electrolytes: s/p HD on 4/30 - K up to 6.7, PHos up to 7.5, Mg 2.8, others WNL Renal: CRRT started 4/9 > 4/11 > iHD > CRRT on 5/3  Hepatic: LFTs mildly elevated, Tbili 3.7, albumin 1.3, prealbumin 14.8 >7.8  TG peaked at 740 (3/26) - last value 153 (5/2)  Intake / Output; MIVF: no UOP, drains (5 total): 1654ml, net +17.2L GI Imaging: 4/6 CT - pelvic fluid collection concerning for abscess 4/12 CT - no new fluid collections 4/28 CT - new fluid+gas collection in RLQ GI Surgeries / Procedures: 3/14 Whipple procedure with R hemicolectomy and end ileostomy, cholecystectomy 3/15 washout, VAC placement 3/17 washout, VAC replacement.  Necrosis of entire small bowel except for the PB limb and proximal 40-50cm of jejunum 3/19 re-opening laparotomy, abd closure.  Necrotic SB. 3/21 SB resection, take down of choledochojejunal anastomosis and  pancreaticojejunal anastomosis, take down of duodenojejunal anastomosis, placement of externalized biliary drain / Stamm gastrostomy tube, IA washout and placement of intraperitoneal drains 3/30 extubation> 4/8 trach 4/12 left lateral abdominal drain placed by IR  Central access: R IJ CVC placed 3/15; changed to PICC placed 10/03/20 TPN start date: 10/02/2020  Nutritional Goals (RD rec on 4/21): 2800-3000 kCal, 185-210g AA, fluid >2L/day -on lipid days (MWF): 3120 kcal with 205g protein, 528g CHO, 50g SMOF lipid -on NON-lipid days (TThSaSun): 2618kcal, 528g CHO, 205g protein   Current Nutrition:  TPN  Plan: - Hold TPN this AM in the setting of hyperkalemia. Resume cyclic TPN this evening. Given shortage of clinisol 15%, change amino acids to travasol 10%. Infuse 3100 mls over 18 hrs: 91 ml/hr x1 hr, 182 ml/hr x 16 hrs, 91 ml/hr x 1hr - Continue 19g/L SMOFlipid on MWF due to elevated TG - TPN provides a weekly avg of 2833 kCal and 205g AA per day, meeting 100% of needs - Electrolytes in TPN: Na 174mEq/L, remove K, no Ca, decrease Mg to 5 mEq/L, Decrease Phos to 57mmol/L, Change to max acetate  - Daily multivitamin in TPN. Remove standard trace elements and add back zinc 5mg  and selenium 61mcg. Remove chromium while on RRT. - Custom sensitive SSI schedule 0200, 1300, 1500, 2000 to correspond with cycle times  - Continue 60 units insulin regular in TPN  - Standard TPN labs on Mon/Thurs - Monitor hemodynamic stability.  Hold off on shortening TPN cycle further.  Albertina Parr, PharmD., BCPS, BCCCP Clinical Pharmacist Please refer to [facility] for unit-specific pharmacist

## 2020-11-11 NOTE — Progress Notes (Signed)
ABG critical, PA informed, will not cross over into epic.  PH 6.94 CO2 46.4 PaO2 140 HCO3 10.3 97%

## 2020-11-11 NOTE — Progress Notes (Signed)
Pharmacy Antibiotic Note  Chief [name] is Billy 50 y.o. male with fever and possible sepsis.  Pharmacy has been managing zosyn dosing. Now adding empiric vancomycin and anidulafungin for worsening pneumonia/septic shock.    WBC 24.1, AF. Transitioning to CRRT today   Plan: -Adjust Zosyn to 3.375 gm IV Q 6 hours with CRRT -Vancomycin 2 gm IV load followed by vancomycin 1 gm IV Q 24 hours -Anidulafungin per MD -Monitor CBC, renal fx, cultures and clinical progress -Vanc trough at steady state    Height: 6\' 2"  (188 cm) Weight: 96 kg (211 lb 10.3 oz) IBW/kg (Calculated) : 82.2  Temp (24hrs), Avg:99.7 F (37.6 C), Min:98.1 F (36.7 C), Max:102.1 F (38.9 C)  Recent Labs  Lab 11/07/20 0522 11/08/20 0559 11/09/20 0500 11/10/20 0501 11/11/20 0300  WBC 9.2 8.8 8.8 8.6 24.1*  CREATININE 3.95* 5.63* 2.63* 4.93*  4.94* 6.80*    Estimated Creatinine Clearance: 15.3 mL/min (Billy) (by C-G formula based on SCr of 6.8 mg/dL (H)).    Allergies  Allergen Reactions  . Ace Inhibitors Cough   Vanc 3/18 >> 3/21; 5/3 >>  CTX 3/17 >> 3/21 Flagyl 3/17 >> 3/21 Zosyn 3/21 >>3/30, restarted 4/5 >>4/20; 5/1>>  Eraxis 3/22 >> 3/29, restarted 4/12 >>4/20; 5/3>>  3/19 VR 9 - 1750 mg q24  3/14 MRSA PCR - negative 3/23 BCx - negF 4/5 TA >> rare klebsiella   4/12 BCx - negF 4/12 Abscess - Kleb pneumo 5/1 BCx >> ngtd    Thank you for allowing pharmacy to be Billy part of this patient's care.  Albertina Parr, PharmD., BCPS, BCCCP Clinical Pharmacist Please refer to [facility] for unit-specific pharmacist

## 2020-11-11 NOTE — Plan of Care (Signed)
  Interdisciplinary Goals of Care Family Meeting   Date carried out:: 11/11/2020  Location of the meeting: Conference room  Member's involved: Physician, Family Member or next of kin and Other: CCM NP and Medical Student  Durable Power of Attorney or acting medical decision maker: Wife, Brothers  Discussion: We discussed goals of care for [name] .  We had an extensive conversation regarding [name]'s condition today.  I explained that he now has multi-organ failure and is rapidly declining to the point that he is requiring multiple forms of life support.  I explained that he is not a candidate for a bowel transplant.  In the event of a cardiac arrest CPR and shocks would provide no medical benefit and would cause unnecessary harm and suffering. They voiced understanding and agreed with my suggested plan of changing code status to DNR but maintaining the current level of support which includes dialysis, mechanical ventilation and vasopressors.  We will not escalate care further as we are currently providing maximum doses of multiple vasopressors.    Code status: Full DNR, do not escalate care further  Disposition: Continue current acute care  Time spent for the meeting: 40 minutes  Roselie Awkward 11/11/2020, 11:11 AM

## 2020-11-11 NOTE — Progress Notes (Addendum)
Tachy-therapies turned off after conversation with Dr. Lake Bells and family.   Legrand Como [address] [employer], [place]  11/11/2020 12:23 PM

## 2020-11-11 NOTE — Progress Notes (Signed)
Bedside Rn called reporting, marginal BP  94/42, 86/35, 89/35  making urine has ns @ 100 and HCO3 @ 50.  EMD Dr Genevive Bi would like to monitor for now.

## 2020-11-11 NOTE — Progress Notes (Signed)
[name], MRN:  [id number], DOB:  1971/06/11, LOS: 13 ADMISSION DATE:  09/24/2020, CONSULTATION DATE:  11/11/20 REFERRING MD:  Anesthesia, CHIEF COMPLAINT:  Whipple, post-op shock   Brief History:  50 y.o. M with PMH of neuroendocrine tumor and underwent Whipple procedure 3/14 with intra-operative SMV laceration and repair by vascular with bovine pericardial patch.  Pt had worsening shock overnight and required three pressors despite massive transfusion protocol (received 150 blood products).  He was taken back to the OR for washout on 3/15 and returned to the ICU intubated.  Taken back 3/17 again found to have small bowel necrosis s/p resection.  Went back for ex-lap on 3/19, noted to have necrosis of nearly entirety of bowel, no bowel resected.  Fascia closed.  Case was discussed with family and [facility] transplant surgery for consideration of eventual small bowel transplant.  Electively returned to the OR on 3/21 for resection of remaining necrotic small bowel removed, G-tube placed, JP drains in place (2). Tracheostomy ws performed by Dr. Bobbye Morton on 4/8.  Started CRRT for AKI on 4/9.    Past Medical History:  Neuroendocrine tumor of pancreas, resected 09/13/2020, negative surgical margins, lymph nodes negative Hypertension Chronic Systolic heart failure LVEF 35% in 2011, 2022 LVEF 50%, RVSP normal, LA dilated Non-ischemic cardioyopathy Hyperlipidemia V-fib with arrest, AICD placed in [facility] Events:  3/14: Admit to Surgery, to OR for whipple, SMV injury and repair, massive transfusion protocol overnight  3/15: Back to OR for washout, x2. IR for arteriogram. No major bleed source identified in OR, or arterial bleed w IR. Robust product resuscitation >75 products (+ TXA + novoseven) 3/16: off pressors, add'l 39 products overnight. Slowing resuscitation this morning with hemodynamic improvements and slowing drain output; OR x 2 - washout, biliary t tube, wound vac --  washout, wound vac, IR for arteriogram -- no arterial bleed for embolization 3/17:  Aggressive balanced transfusion continue overnight with marked hemodynamic improvement since yesterday evening. Off pressors x several hours, Wound vac output significantly slowing (from 545m q15-366m to 50047m1hr+) and output is much thinner, Thin bloody secretions from mouth approx 250m60mecreased RR from 22 to 18, Unable to tolerate NE- severe bradycardia 3/19 taken back to OR. Small bowel noted to be almost completely necrosed. Patient was closed with plans for goals of car discussion.  3/20 GOC [place] requests full scope of treatment. Primary team discussed with [facility] the possibility of transfer for small bowel transplant. [facility] felt as though transfer would not change outcome. 3/21 Ongoing discussion with family and [facility]. Patient may be a candidate for transplant if he can stabilize post a small bowel resection. He was taken back to the OR and small bowel was resected.  3/23 weaning pressors. Versed off. Remains encephalopathic 3/24 off pressors. Low dose dilaudid gtt -- only weakly grimaces to pain. Changing sedation to precedex + PRN fentanyl. Long discussion with 2 brothers regarding clinical case -- tried to clarify that while the term "stable" has been used, in this instance is meaning that he has not declined from previous shift but is in fact still critically ill with multisystem organ dysfunction.  3/25 Cr and BUN increased. Off sedation  3/26 Pressors off, CT head benign, BUN/Cr continue to creep up 3/27 Tachycardia/HTN overnight improved with fentanyl gtt 3/28 PCCM sign off. Trauma to take over vent/CCM needs.  3/30 extubated 4/2 desaturated overnight to the 30s improved with BVM and NTS.  4/5 poor airway protection. PCCM  consulted for re-intubation.  4/6 CT a/p Large RLL opacity, smaller LLL opacity. Post op changes. 5.4cm fluid collection in posterior pelvis, concerning for abscess. Started on Zosyn   4/7 got transfused overnight and then diuresed. Worse renal function 4/8 Tracheostomy performed by general surgery 4/9 transfused again. HD line inserted and started on CRRT 4/10 started on trach collar trials. 4/12 off CRRT and transitioned to IHD. Surgery placed abdominal drain with minimal output-- during procedure seemed c/w hematoma however since grew out klebsiella  4/13, 4/14 Tolerated dialysis 4/15 Placed on vent overnight for rest 4/16 Tolerating trach collar 4/17 got HD. Trach downsized to 6 cuffless, trach dislodged overnight, replaced with #6 cuffless shiley.  4/18 afebrile, trach collar + PMV. Up to chair with PT, PCCM signed off 4/21 decannulated; called back for hypotension, Afib/RVR, increased output from his 5 drains and wound vac.Transfused 2 units, re-cannulated, on Levo and Neo.  ICD fired x2, cardiology consulted 4/24 Continues to tolerate trach collar, 5L 28% 4/29 fever overnight, no complaints, remains on tracheostomy collar 4/30-5/1 fever continued, WBC unchanged, blood cultures sent, zosyn restarted 5/2 back on NE for hypotension   Consults:  PCCM VVS  IR  Palliative Cardiology Nephrology  Procedures:  PICC 3/25 > JP x 2  Biliary drain Pancreatic stent drain ETT 2/24>>3/13; 3/15 >> 3/29;  4/5>>4/8 Trach 4/8> changed to cuffed 5/3 >> R IJ temp HD cath 4/9 >> L lateral abdominal drain 4/12> up sized 4/29 >>  Significant Diagnostic Tests:  See radiology tab Most recent significant imaging:  4/12 CT abd/pelvis>>  Unchanged ill-defined fluid collections within the left side of the root of the abdominal mesentery, the ventral aspect of the abdominal mesentery, the subcapsular aspect of the right lobe of the liver and the pelvic cul-de-sac all of which are incompletely evaluated on this noncontrast examination though may represent areas of evolving hematomas giving imaging stability for the past week. Redemonstrated ill-defined stranding surrounding the  remaining portions of the pancreas without definable/drainable fluid collection on this noncontrast examination.  4/29 CT abd/ pelvis >> Complex examination. Suspected near complete resection of the small bowel and right hemicolectomy with the distal colon remaining.  Resection of the a duodenum, head of the pancreas, and gallbladder in keeping with Whipple resection. The pancreatico biliary enteric limb is not well appreciated on this noncontrast examination.  Percutaneous drainage of the biliary and pancreatic effluent noted. Repeat imaging with contrast administration, if possible, would better delineate the enteric and vascular structures.    Interval percutaneous drainage catheter placement within the retroperitoneal hematoma noted on prior examination within the left anterior pararenal space.  Interval development of large complex loculated collection containing extensive interstitial gas and high attenuation material compatible with blood product within the retroperitoneum within the resection bed of the duodenum and small bowel. This may relate to enteric communication, catheter malfunction and incomplete drainage in combination with flushing of the drainage catheters, or, less likely, aggressive infection. This gas and fluid appears to communicate with the laparotomy incision inferiorly.  4.7 cm deep pelvic loculated fluid collection stable from prior examination. Right subhepatic loculated fluid collection appears slightly smaller in the interval.  Micro Data:  See micro tab Most recent significant findings:   4/5 trach asp >> Klebsiella pneumoniae 4/12 Pelvic abscess >> ampicillin resistant Klebsiella pneumoniae 4/12 BCx> no growth   5/1 blood cultures >> 5/3 trach asp >>  Antimicrobials:  Zosyn 3/21 > 3/29; 4/5 >4/19; 5/1 >  Eraxis 3/23> 3/29, 4/12>4/19  Interim History / Subjective:   Started to decompensate yesterday afternoon, back on NE Overnight, bradycardic->  amio stopped, more lethargic, acidotic, hyperkalemic, and hypoxic on trach collar, trach exchanged for cuffed and placed back on vent Trach cx sent Now on NE 40 mcg and vasopressin L radial aline placed overnight not working, replaced with R radial aline per CCS  Febrile this am +1.4L/ net +22L UOP 80 ml/ 24 hrs Per RN, intermittently nodding appropriately to commands    Objective   Blood pressure 111/61, pulse 64, temperature 99.4 F (37.4 C), temperature source Oral, resp. rate (!) 29, height _0  (1.88 m), weight 96 kg, SpO2 99 %.    Vent Mode: PRVC FiO2 (%):  [28 %-100 %] 100 % Set Rate:  [24 bmp] 24 bmp Vt Set:  [650 mL] 650 mL PEEP:  [8 cmH20] 8 cmH20 Plateau Pressure:  [32 cmH20] 32 cmH20   Intake/Output Summary (Last 24 hours) at 11/11/2020 4332 Last data filed at 11/11/2020 0630 Gross per 24 hour  Intake 2786.78 ml  Output 1380 ml  Net 1406.78 ml   Filed Weights   11/04/20 0700 11/06/20 0500 11/06/20 1530  Weight: 97.4 kg 95.6 kg 96 kg   Physical Exam: General:  Ill appearing adult male lying in bed, appears uncomfortable HEENT: MM pink/moist, pupils 3/reactive, midline cuffed 6 shiley Neuro: not currently f/c for me, spont MAE CV: IRIR- 54s PULM:  Tachypneic over set rate, mildly labored, coarse and rhonchi throughout with exp wheeze, thick tan/ bloody secretions GI:  Midline wound in place with wound vac, multiple drain- pancreatic, biliary, JP on R, abscess on left Extremities: warm/dry Skin: no rashes  CXR- new right dense airspace disease, CBC/ BMET, Mag, BNP 2755 reviewed  Resolved Hospital Problem list   Hemorrhagic shock hypophosphatemia Septic shock  Klebsiella pneumoniae pneumonia Acute hypoxemic respiratory failure requiring mechanical ventilation  Assessment & Plan:   Acute respiratory failure with hypoxia > improved but decompensated overnight 5/2 R PNA - aspiration vs HCAP S/p Tracheostomy -- decannulation 4/21 but near immediate replacement  in setting of shock/bleeding/atrial fibrillation; was briefly on mechanical vent support on 4/21, but back to trach collar on 4/22; back on vent 5/3 Improving resp acidosis on ABG since placed back on vent support  - Continue MV support, 4-8cc/kg IBW with goal Pplat <30 and DP<15 - VAP prevention protocol/ PPI - PAD protocol for sedation> prn fentanyl w/ fentanyl patch - wean PEEP/ FiO2 as able for SpO2 >92%  - plans for bronch this morning by Dr. Lake Bells, will send for BAL - vanc added per CCS, follow culture data - trend CXR/ ABG  PAF  Systolic heart failure  ICD in place - IV amiodarone per cardiology- on hold currently secondary to bradycardia overnight  - Tele monitoring - Per cards - Mag 2.8  Septic shock Developed fever 5/1, zosyn added, broad ddx, R PNA, +/- abdominal related given CT from last week and repositioning of abd drain 4/29 +/- fungemia, nosocomial, non-infectious causes as well - Per primary service, discussed with Dr. Zenia Resides and Dr. Lake Bells at bedside.   - H/H stable - continue NE and vasopressin for MAP goal > 65 - Continue zosyn, vanc and eraxis added - Pending bronch, will send BAL - Follow BCx - Trend WBC/ fever curve  - continue aline  No small bowel TPN, currently on hold given hyperkalemia and K contents in TPN  AKI now on HD Hyperkalemia without EKG changes 5/3 AGMA - s/p bicarb overnight -  temporizing measures per CCS for hyperkalemia - per renal, will likely go back on CRRT today from TTS schedule  - trend renal indices - bladder scanned this am with no retention   Daily Goals Checklist   SQ heparin for VTE TPN on hold,  continue SSI sensitive   GOC:  Per Dr. Zenia Resides and Dr. Lake Bells.  No family currently at bedside    CCT: 6 mins  Kennieth Rad, ACNP [place] 11/11/2020, 7:33 AM

## 2020-11-11 NOTE — Progress Notes (Signed)
[facility] Progress Note Patient Name: [name] DOB: 08-Jan-1971 MRN: [id number]   Date of Service  11/11/2020  HPI/Events of Note  Notified of increasing norepinephrine requirement. HR 50s.  eICU Interventions  Hold amiodarone drip. Give 500 cc NS bolus. Follow up labs Will inform bedside CCM team     Intervention Category Major Interventions: Hypotension - evaluation and management;Arrhythmia - evaluation and management  Judd Lien 11/11/2020, 2:56 AM

## 2020-11-11 NOTE — Progress Notes (Signed)
Patient with significant and persistent tracheal cuff leak.  Trach changed to #6 shiley XLT distal without complication.  Positive color change on easy cap. Resolution of cuff leak, good volume return through vent.  CXR pending.  Vitals stable throughout.

## 2020-11-11 NOTE — Progress Notes (Signed)
Notified MD [place] again of patients worsening condition.  Orders to turn off amio, give 586ml NS bolus and informed groundteam will come see patient.  Will continue to monitor.

## 2020-11-11 NOTE — Progress Notes (Signed)
[facility] PCCM  Met with family again late morning, discussed ICD function.  Decided to turn off the ICD as repeated shocks in the setting of refractory septic shock is not medically beneficial.  Appreciate cardiology support.  Maintain amiodarone at this point.  No further boluses, don't titrate up vasopressors at this point.   Roselie Awkward, MD [place] [facility] Pager: [phone] Cell: [phone] If no response, please call [phone] until 7pm After 7:00 pm call Elink  [phone]

## 2020-11-11 NOTE — Progress Notes (Signed)
Notified MD [place] of patient required titration of 27mcg of levo to 99mcgs, also FI02 increased from 28% on trach collar to 60% as well as afib with bradycardia.  Sent off labs, and EKG.  Patient is stable at this time, will continue to monitor.

## 2020-11-11 NOTE — Progress Notes (Signed)
eLink Physician-Brief Progress Note Patient Name: [name] DOB: 1970-11-13 MRN: [id number]   Date of Service  11/11/2020  HPI/Events of Note  Notified of change in rhythm, on amiodarone drip Increasing O2 requirement, started on piperacillin tazobactam yesterday. CXR with worsening of infiltrates  eICU Interventions  Labs to be done now, add on BNP EKG Sent trach specimen for culture as with risk of MDR organism. Blood culture (5/1) NGTD  Refer back to CCM team if continued assistance needed     Intervention Category Major Interventions: Arrhythmia - evaluation and management;Respiratory failure - evaluation and management  Judd Lien 11/11/2020, 1:12 AM

## 2020-11-11 NOTE — Progress Notes (Signed)
eLink Physician-Brief Progress Note Patient Name: [name] DOB: 1970-11-27 MRN: [id number]   Date of Service  11/11/2020  HPI/Events of Note  Patient with tracheostomy cuff leak.  eICU Interventions  PCCM ground crew requested to evaluate tracheostomy.        Frederik Pear 11/11/2020, 8:48 PM

## 2020-11-11 NOTE — Progress Notes (Signed)
25 Days Post-Op  Subjective: Patient became increasingly hypotensive yesterday evening, low dose levophed started yesterday evening. Worsening hypotension overnight, patient is now on levo and vasopressin. Hypoxic and placed back on vent. Severe respiratory and metabolic acidosis with hyperkalemia. Overnight given bicarb and fluid bolus. Amio gtt stopped due to bradycardia. Patient is now in afib with rate in 60s. Somnolent. CXR shows diffuse infiltrates throughout right lung.  Objective: Vital signs in last 24 hours: Temp:  [98.1 F (36.7 C)-102.1 F (38.9 C)] 99.4 F (37.4 C) (05/03 0400) Pulse Rate:  [50-106] 64 (05/03 0600) Resp:  [22-35] 29 (05/03 0600) BP: (63-111)/(33-66) 111/61 (05/03 0500) SpO2:  [86 %-99 %] 99 % (05/03 0600) FiO2 (%):  [28 %-100 %] 100 % (05/03 0418) Last BM Date:  (pta)  Intake/Output from previous day: 05/02 0701 - 05/03 0700 In: 2786.8 [I.V.:2104.5; IV Piggyback:682.3] Out: 1380 [Urine:80; Drains:1300] Intake/Output this shift: No intake/output data recorded.  PE: General: resting comfortably, NAD Neuro: alert and oriented  HEENT: trach in place, site is clean Resp: trach in place, on vent Vent Mode: PRVC FiO2 (%):  [28 %-100 %] 100 % Set Rate:  [24 bmp] 24 bmp Vt Set:  [650 mL] 650 mL PEEP:  [8 cmH20] 8 cmH20 Plateau Pressure:  [32 cmH20] 32 cmH20 CV: irregularly irregular rhythm Abdomen: soft, nondistended, vac in place on midline wound draining blood-tinged fluid. RUQ JP x1 with old blood and bile-tinged fluid. Biliary drain with bile. LUQ perc drain to gravity drainage with cloudy brown fluid. Pancreatic stent draining clear colorless fluid. G tube in place to gravity drainage, draining clear nonbloody gastric contents. Extremities: warm and well-perfused  Lab Results:  Recent Labs    11/10/20 0501 11/11/20 0300 11/11/20 0336 11/11/20 0411 11/11/20 0642  WBC 8.6 24.1*  --   --   --   HGB 8.5* 8.9*   < > 10.5* 10.2*  HCT 25.9*  28.3*   < > 31.0* 30.0*  PLT 192 309  --   --   --    < > = values in this interval not displayed.   BMET Recent Labs    11/10/20 0501 11/11/20 0300 11/11/20 0336 11/11/20 0411 11/11/20 0642  NA 135  136 135   < > 134* 135  K 4.3  4.3 7.0*   < > 6.8* 6.5*  CL 100  100 100  --   --   --   CO2 21*  22 16*  --   --   --   GLUCOSE 143*  145* 273*  --   --   --   BUN 85*  85* 117*  --   --   --   CREATININE 4.93*  4.94* 6.80*  --   --   --   CALCIUM 7.9*  8.0* 8.5*  --   --   --    < > = values in this interval not displayed.   PT/INR Recent Labs    11/09/20 0500 11/10/20 0501  LABPROT 16.9* 16.6*  INR 1.4* 1.3*   CMP     Component Value Date/Time   NA 135 11/11/2020 0642   NA 142 07/21/2020 0921   K 6.5 (HH) 11/11/2020 0642   CL 100 11/11/2020 0300   CO2 16 (L) 11/11/2020 0300   GLUCOSE 273 (H) 11/11/2020 0300   BUN 117 (H) 11/11/2020 0300   BUN 16 07/21/2020 0921   CREATININE 6.80 (H) 11/11/2020 0300   CREATININE 1.16 03/01/2016 1607  CALCIUM 8.5 (L) 11/11/2020 0300   PROT 6.7 11/10/2020 0501   PROT 6.7 07/21/2020 0921   ALBUMIN 1.4 (L) 11/10/2020 0501   ALBUMIN 1.3 (L) 11/10/2020 0501   ALBUMIN 4.1 07/21/2020 0921   AST 83 (H) 11/10/2020 0501   ALT 142 (H) 11/10/2020 0501   ALKPHOS 66 11/10/2020 0501   BILITOT 3.7 (H) 11/10/2020 0501   BILITOT 0.8 07/21/2020 0921   GFRNONAA 9 (L) 11/11/2020 0300   GFRNONAA >89 09/22/2015 1008   GFRAA 82 07/21/2020 0921   GFRAA >89 09/22/2015 1008   Lipase     Component Value Date/Time   LIPASE 23 08/22/2020 0318       Assessment/Plan 50 yo male 6 month s/p Whipple, right colectomy and SMV repair for T3N0 neuroendocrine tumor. C/b postop hemorrhagic shock with DIC and subsequent diffuse small bowel necrosis, now s/p small bowel resection with external drainage of bile duct, pancreatic duct and stomach. - Patient now in septic shock, likely secondary to pneumonia given CXR findings. Given opacification of  entire right lung and known secretions, suspect a mucous plug. Will discuss bronch with CCM this morning. - Abdominal drains are all functioning. - Continue zosyn, add vancomycin - Insulin calcium ordered for hyperkalemia. Discussed with nephrology and will begin CRRT this morning. - Continue TPN - Prn fentanyl for pain - VTE: SQH - Dispo: ICU. Overall prognosis remains poor.   LOS: 50 days    Michaelle Birks, MD [facility] Surgery General, Hepatobiliary and Pancreatic Surgery 11/11/20 7:43 AM

## 2020-11-11 NOTE — Progress Notes (Signed)
5/3 0600  Test: potassium  Critical Value: 7.0  Name of Provider Notified: PA Reese  Orders Received? Or Actions Taken?: No new orders at this time

## 2020-11-11 NOTE — Progress Notes (Signed)
PCCM Interval Progress Note  Received secure chat message from [place] (Dr. Genevive Bi) requesting ground team to assess patient; per Dr. Genevive Bi " day team signed off this morning, now has increased O2 and pressor requirement. They started Pip-Tazo for worsening CXR/fever. Stopped amiodarone and gave fluid bolus, nursing asking bedside team to assess."  On arrival to assess patient, he was significant more lethargic per nursing staff, minimally responsive and not following commands. O2 sats were ~85% on 96% trach collar with tachypnea and poor air movement. Bilateral lung fields with scattered rhonchi and crackles. RT noted that patient's trach was changed to a 6 cuffless earlier in the day and that he was having thick secretions. ABG: pH 6.997/pCO2 63.4/pO2 39/Bicarb 15.6. Repeat iSTAT: 6.97/pCO2 66.3/pO2 73/Bicarb 15.5.  Tracheostomy was changed to 6 cuffed and patient was placed back on vent (PRVC, PEEP 8, 6-8cc/kg, FiO2 100%). Levophed was maxed at 4mcg with MAPs remaining low and vasopressin was started. Bicarb x 3 amps were administered with improvement in pressures. CXR was repeated demonstrating worsening R airspace disease. A-line was placed by Dr. Carson Myrtle (PCCM) for BP monitoring (per Trauma request); repeat ABG pending.  Lestine Mount, PA-C [facility] 11/11/20 6:32 AM  Please see [email or web address] for pager details.  From 7A-7P if no response, please call [phone] After hours, please call ELink [phone]

## 2020-11-11 NOTE — Procedures (Signed)
Tracheostomy Change Note  Patient Details:   Name: [name] DOB: [birth year] MRN: [id number]    Airway Documentation:     Evaluation  O2 sats: stable throughout Complications: No apparent complications Patient did tolerate procedure well. Bilateral Breath Sounds: Rhonchi    [name] [name] 11/11/2020, 4:23 AM

## 2020-11-11 NOTE — Procedures (Signed)
Bronchoscopy Procedure Note  CELESTINO ACKERMAN  [id number]  [birth year]  Date:11/11/20  Time:9:07 AM   Provider Performing:Brent Alonzo Owczarzak   Procedure(s):  Flexible bronchoscopy with bronchial alveolar lavage (46962)  Indication(s) Healthcare associated pneumonia  Consent Risks of the procedure as well as the alternatives and risks of each were explained to the patient and/or caregiver.  Consent for the procedure was obtained and is signed in the bedside chart  Anesthesia IV fentanyl 163mg once   Time Out Verified patient identification, verified procedure, site/side was marked, verified correct patient position, special equipment/implants available, medications/allergies/relevant history reviewed, required imaging and test results available.   Sterile Technique Usual hand hygiene, masks, gowns, and gloves were used   Procedure Description Bronchoscope advanced through endotracheal tube and into airway.  Airways were examined down to subsegmental level with findings noted below.   Following diagnostic evaluation, suctioning performed in the right mainstem bronchus and bronchus intermedius where there was significant bloody secretions.    Findings: Right mainstem bronchus and bronchus intermedius where there was significant bloody secretions.    Complications/Tolerance None; patient tolerated the procedure well. Chest X-ray is not needed post procedure.   EBL Minimal   Specimen(s) resp culture   BRoselie Awkward MD [facility] Pager: [phone]: [phone] no response, please call [phone] until 7pm After 7:00 pm call Elink  [phone]

## 2020-11-12 ENCOUNTER — Inpatient Hospital Stay (HOSPITAL_COMMUNITY): Payer: Managed Care, Other (non HMO)

## 2020-11-12 DIAGNOSIS — Z515 Encounter for palliative care: Secondary | ICD-10-CM | POA: Diagnosis not present

## 2020-11-12 DIAGNOSIS — Z7189 Other specified counseling: Secondary | ICD-10-CM | POA: Diagnosis not present

## 2020-11-12 DIAGNOSIS — A419 Sepsis, unspecified organism: Secondary | ICD-10-CM | POA: Diagnosis not present

## 2020-11-12 DIAGNOSIS — D3A8 Other benign neuroendocrine tumors: Secondary | ICD-10-CM | POA: Diagnosis not present

## 2020-11-12 LAB — RENAL FUNCTION PANEL
Albumin: 1.3 g/dL — ABNORMAL LOW (ref 3.5–5.0)
Albumin: 1.3 g/dL — ABNORMAL LOW (ref 3.5–5.0)
Anion gap: 22 — ABNORMAL HIGH (ref 5–15)
Anion gap: 14 (ref 5–15)
BUN: 66 mg/dL — ABNORMAL HIGH (ref 6–20)
BUN: 58 mg/dL — ABNORMAL HIGH (ref 6–20)
CO2: 18 mmol/L — ABNORMAL LOW (ref 22–32)
CO2: 24 mmol/L (ref 22–32)
Calcium: 7.6 mg/dL — ABNORMAL LOW (ref 8.9–10.3)
Calcium: 7.8 mg/dL — ABNORMAL LOW (ref 8.9–10.3)
Chloride: 96 mmol/L — ABNORMAL LOW (ref 98–111)
Chloride: 99 mmol/L (ref 98–111)
Creatinine, Ser: 3.77 mg/dL — ABNORMAL HIGH (ref 0.61–1.24)
Creatinine, Ser: 2.8 mg/dL — ABNORMAL HIGH (ref 0.61–1.24)
GFR, Estimated: 19 mL/min — ABNORMAL LOW (ref >60)
GFR, Estimated: 27 mL/min — ABNORMAL LOW (ref >60)
Glucose, Bld: 399 mg/dL — ABNORMAL HIGH (ref 70–99)
Glucose, Bld: 268 mg/dL — ABNORMAL HIGH (ref 70–99)
Phosphorus: 6 mg/dL — ABNORMAL HIGH (ref 2.5–4.6)
Phosphorus: 3.9 mg/dL (ref 2.5–4.6)
Potassium: 4.2 mmol/L (ref 3.5–5.1)
Potassium: 4 mmol/L (ref 3.5–5.1)
Sodium: 136 mmol/L (ref 135–145)
Sodium: 137 mmol/L (ref 135–145)

## 2020-11-12 LAB — POCT I-STAT 7, (LYTES, BLD GAS, ICA,H+H)
Acid-base deficit: 16 mmol/L — ABNORMAL HIGH (ref 0.0–2.0)
Acid-base deficit: 21 mmol/L — ABNORMAL HIGH (ref 0.0–2.0)
Acid-base deficit: 7 mmol/L — ABNORMAL HIGH (ref 0.0–2.0)
Acid-base deficit: 8 mmol/L — ABNORMAL HIGH (ref 0.0–2.0)
Bicarbonate: 10.3 mmol/L — ABNORMAL LOW (ref 20.0–28.0)
Bicarbonate: 14.2 mmol/L — ABNORMAL LOW (ref 20.0–28.0)
Bicarbonate: 20.9 mmol/L (ref 20.0–28.0)
Bicarbonate: 20 mmol/L (ref 20.0–28.0)
Calcium, Ion: 1 mmol/L — ABNORMAL LOW (ref 1.15–1.40)
Calcium, Ion: 1.01 mmol/L — ABNORMAL LOW (ref 1.15–1.40)
Calcium, Ion: 1.02 mmol/L — ABNORMAL LOW (ref 1.15–1.40)
Calcium, Ion: 1.03 mmol/L — ABNORMAL LOW (ref 1.15–1.40)
HCT: 31 % — ABNORMAL LOW (ref 39.0–52.0)
HCT: 31 % — ABNORMAL LOW (ref 39.0–52.0)
HCT: 30 % — ABNORMAL LOW (ref 39.0–52.0)
HCT: 30 % — ABNORMAL LOW (ref 39.0–52.0)
Hemoglobin: 10.5 g/dL — ABNORMAL LOW (ref 13.0–17.0)
Hemoglobin: 10.5 g/dL — ABNORMAL LOW (ref 13.0–17.0)
Hemoglobin: 10.2 g/dL — ABNORMAL LOW (ref 13.0–17.0)
Hemoglobin: 10.2 g/dL — ABNORMAL LOW (ref 13.0–17.0)
O2 Saturation: 97 %
O2 Saturation: 97 %
O2 Saturation: 96 %
O2 Saturation: 92 %
Patient temperature: 95.7
Patient temperature: 97.2
Patient temperature: 98.4
Patient temperature: 95.7
Potassium: 4.3 mmol/L (ref 3.5–5.1)
Potassium: 6.2 mmol/L — ABNORMAL HIGH (ref 3.5–5.1)
Potassium: 4.1 mmol/L (ref 3.5–5.1)
Potassium: 4.1 mmol/L (ref 3.5–5.1)
Sodium: 132 mmol/L — ABNORMAL LOW (ref 135–145)
Sodium: 135 mmol/L (ref 135–145)
Sodium: 138 mmol/L (ref 135–145)
Sodium: 135 mmol/L (ref 135–145)
TCO2: 12 mmol/L — ABNORMAL LOW (ref 22–32)
TCO2: 16 mmol/L — ABNORMAL LOW (ref 22–32)
TCO2: 23 mmol/L (ref 22–32)
TCO2: 22 mmol/L (ref 22–32)
pCO2 arterial: 46.4 mmHg (ref 32.0–48.0)
pCO2 arterial: 46.6 mmHg (ref 32.0–48.0)
pCO2 arterial: 54 mmHg — ABNORMAL HIGH (ref 32.0–48.0)
pCO2 arterial: 47.6 mmHg (ref 32.0–48.0)
pH, Arterial: 6.948 — CL (ref 7.350–7.450)
pH, Arterial: 7.081 — CL (ref 7.350–7.450)
pH, Arterial: 7.196 — CL (ref 7.350–7.450)
pH, Arterial: 7.223 — ABNORMAL LOW (ref 7.350–7.450)
pO2, Arterial: 123 mmHg — ABNORMAL HIGH (ref 83.0–108.0)
pO2, Arterial: 140 mmHg — ABNORMAL HIGH (ref 83.0–108.0)
pO2, Arterial: 99 mmHg (ref 83.0–108.0)
pO2, Arterial: 71 mmHg — ABNORMAL LOW (ref 83.0–108.0)

## 2020-11-12 LAB — BASIC METABOLIC PANEL
Anion gap: 25 — ABNORMAL HIGH (ref 5–15)
BUN: 73 mg/dL — ABNORMAL HIGH (ref 6–20)
CO2: 13 mmol/L — ABNORMAL LOW (ref 22–32)
Calcium: 7.6 mg/dL — ABNORMAL LOW (ref 8.9–10.3)
Chloride: 96 mmol/L — ABNORMAL LOW (ref 98–111)
Creatinine, Ser: 4.31 mg/dL — ABNORMAL HIGH (ref 0.61–1.24)
GFR, Estimated: 16 mL/min — ABNORMAL LOW (ref >60)
Glucose, Bld: 355 mg/dL — ABNORMAL HIGH (ref 70–99)
Potassium: 4.4 mmol/L (ref 3.5–5.1)
Sodium: 134 mmol/L — ABNORMAL LOW (ref 135–145)

## 2020-11-12 LAB — CBC
HCT: 28.4 % — ABNORMAL LOW (ref 39.0–52.0)
Hemoglobin: 9 g/dL — ABNORMAL LOW (ref 13.0–17.0)
MCH: 26.1 pg (ref 26.0–34.0)
MCHC: 31.7 g/dL (ref 30.0–36.0)
MCV: 82.3 fL (ref 80.0–100.0)
Platelets: 248 10*3/uL (ref 150–400)
RBC: 3.45 MIL/uL — ABNORMAL LOW (ref 4.22–5.81)
RDW: 22.8 % — ABNORMAL HIGH (ref 11.5–15.5)
WBC: 25.1 10*3/uL — ABNORMAL HIGH (ref 4.0–10.5)
nRBC: 1.4 % — ABNORMAL HIGH (ref 0.0–0.2)

## 2020-11-12 LAB — GLUCOSE, CAPILLARY
Glucose-Capillary: 239 mg/dL — ABNORMAL HIGH (ref 70–99)
Glucose-Capillary: 249 mg/dL — ABNORMAL HIGH (ref 70–99)
Glucose-Capillary: 280 mg/dL — ABNORMAL HIGH (ref 70–99)
Glucose-Capillary: 302 mg/dL — ABNORMAL HIGH (ref 70–99)
Glucose-Capillary: 367 mg/dL — ABNORMAL HIGH (ref 70–99)

## 2020-11-12 LAB — MAGNESIUM: Magnesium: 2.5 mg/dL — ABNORMAL HIGH (ref 1.7–2.4)

## 2020-11-12 MED ORDER — SODIUM PHOSPHATES 45 MMOLE/15ML IV SOLN
30.0000 mmol | Freq: Once | INTRAVENOUS | Status: AC
  Administered 2020-11-12: 30 mmol via INTRAVENOUS
  Filled 2020-11-12: qty 10

## 2020-11-12 MED ORDER — SODIUM BICARBONATE 8.4 % IV SOLN
INTRAVENOUS | Status: AC
  Filled 2020-11-12: qty 50

## 2020-11-12 MED ORDER — PRISMASOL BGK 4/2.5 32-4-2.5 MEQ/L EC SOLN
Status: DC
  Filled 2020-11-12 (×98): qty 5000

## 2020-11-12 MED ORDER — SODIUM BICARBONATE 8.4 % IV SOLN
100.0000 meq | Freq: Once | INTRAVENOUS | Status: AC
  Administered 2020-11-12: 100 meq via INTRAVENOUS
  Filled 2020-11-12: qty 100

## 2020-11-12 MED ORDER — PRISMASOL BGK 4/2.5 32-4-2.5 MEQ/L REPLACEMENT SOLN
Status: DC
  Filled 2020-11-12 (×64): qty 5000

## 2020-11-12 MED ORDER — ZINC CHLORIDE 1 MG/ML IV SOLN
INTRAVENOUS | Status: AC
  Filled 2020-11-12: qty 2052

## 2020-11-12 MED ORDER — INSULIN ASPART 100 UNIT/ML IJ SOLN
0.0000 [IU] | INTRAMUSCULAR | Status: DC
  Administered 2020-11-12 (×2): 3 [IU] via SUBCUTANEOUS
  Administered 2020-11-12: 7 [IU] via SUBCUTANEOUS
  Administered 2020-11-12: 5 [IU] via SUBCUTANEOUS
  Administered 2020-11-13: 7 [IU] via SUBCUTANEOUS
  Administered 2020-11-13: 5 [IU] via SUBCUTANEOUS
  Administered 2020-11-13 (×2): 7 [IU] via SUBCUTANEOUS
  Administered 2020-11-13 – 2020-11-14 (×2): 5 [IU] via SUBCUTANEOUS
  Administered 2020-11-14: 2 [IU] via SUBCUTANEOUS
  Administered 2020-11-14: 3 [IU] via SUBCUTANEOUS

## 2020-11-12 MED ORDER — PRISMASOL BGK 4/2.5 32-4-2.5 MEQ/L REPLACEMENT SOLN
Status: DC
  Filled 2020-11-12 (×32): qty 5000

## 2020-11-12 NOTE — Progress Notes (Signed)
OT Cancellation Note  Patient Details Name: [name] MRN: [id number] DOB: July 14, 1970   Cancelled Treatment:    Reason Eval/Treat Not Completed: Patient not medically ready Holding with pending CRRT needs  Jeri Modena 11/12/2020, 8:11 AM

## 2020-11-12 NOTE — Progress Notes (Signed)
Nutrition Follow-up  DOCUMENTATION CODES:   Not applicable  INTERVENTION:   TNA to meet nutrition needs 19 g/L SMOFlipid on MWF to prevent EFAD due to elevated TG  Pt at high risk of malnutrition due to catabolic state.   NUTRITION DIAGNOSIS:   Increased nutrient needs related to post-op healing as evidenced by estimated needs. Ongoing.   GOAL:   Patient will meet greater than or equal to 90% of their needs Met on TNA.   MONITOR:   Labs,I & O's  REASON FOR ASSESSMENT:   Ventilator    ASSESSMENT:   Pt with a PMH of HTN, HLD, HF with reduced EF, AICD and dx mass adjacent to head of the pancreas in 2021.  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  Developed cholecystitis in Feb 2022 and had percutaneous cholecystectomy tube placement.  Presented this admission for neuroendocrine tumor resection with Whipple procedure, also had right total colectomy, colostomy and cholecystectomy on 10/03/2020.  SMV was lacerated during procedure and repaired by bovine pericardial patch.  Developed hemorrhagic shock with bile leak post-op and underwent placement of biliary T tube with abdominal washout on 3/15.    Pt discussed during ICU rounds and with RN. Per CCM [facility] evaluating for SB transplant.  Noted events of 5/2 pt back on vent support, pressors, and CRRT. Per MD transplant not an option.   3/14 s/p whipple; intra-operative SMV laceration s/p repair; massive transfusion protocol - 120 products; maxed on pressors  3/15 s/p washout and VAC placement  3/16 off pressors; VAC output decreasing, hematemesis with NG not functioning, OG placed 3/17 s/p re-opening of recent laparotomy, SBR, VAC placement (per OR report pt with SB necrosis, only viable areas are stomach pb limb, 40-50 cm beyond GJ anastomosis, and colon) Pt will be TPN dependent. Concern that PB limb may become necrotic.  3/18 TPN initiated @ 45 ml/hr with plans to advance to 90 ml on 3/19 3/22  entirety of SB resected due to necrosis.  3/25 lipids removed from TPN due to elevated TG 3/30 extubated  4/5 re-intubated 4/9-4/12 CRRT 4/13 iHD 4/21 decannulated; pt became hypotensive after HD required re-cannulation levo and neo.  4/22 pt back to trach collar  4/27 wound VAC off now wet to dry dressings, decrease in hgb overnight given 1 u PRBC.  5/2 hypotensive back on pressors  5/3 back to CRRT   Medications reviewed and include: solucortef, SSI, novolog, protonix Fentanyl  Levophed @ 13 mcg Na phos x 1 Vasopressin @ .03 units    Labs reviewed: BUN: 66, Cr: 3.77 TG: 1424 --> 153 CBG's: 249   Output:  UOP: 11 ml    Drain 3: 250 ml  LLQ drain: 5 ml  R drain: 70 ml  RLQ drain: 295 ml  20 F G tube: 0 ml Abd VAC: 70 ml now removed  Total output: 690 ml  UF: 4100 ml I&O: +24 L  TPN @ 150 ml/hr (19 g/L SMOFlipid on MWF) 7 day average provides: 2833 kcal and 205 grams protein   Diet Order:   Diet Order            Diet NPO time specified  Diet effective now                 EDUCATION NEEDS:   No education needs have been identified at this time  Skin:  Skin Assessment: Skin Integrity Issues: Skin Integrity Issues:: Stage III Stage III: neck Wound Vac:  NA Incisions: abdomen, neck  Last BM:  none  Height:   Ht Readings from Last 1 Encounters:  10/14/20 6' 2"  (1.88 m)    Weight:   Wt Readings from Last 1 Encounters:  11/12/20 109.9 kg    BMI:  Body mass index is 31.11 kg/m.  Estimated Nutritional Needs:   Kcal:  2800-3000  Protein:  185-210 grams  Fluid:  > 2L/day  Lockie Pares., RD, LDN, CNSC See AMiON for contact information

## 2020-11-12 NOTE — Progress Notes (Signed)
Critical ABG, still will not crossover into epic   pH 7.19 CO2 54 PaO2 99 HCO3 20.9 96%

## 2020-11-12 NOTE — Progress Notes (Signed)
26 Days Post-Op  Subjective: Off epi. Weaning levophed. Afebrile, WBC remains elevated. Appears to be in sinus rhythm this morning.  Objective: Vital signs in last 24 hours: Temp:  [95.7 F (35.4 C)-98.4 F (36.9 C)] 97.4 F (36.3 C) (05/04 0800) Pulse Rate:  [39-95] 74 (05/04 0800) Resp:  [0-31] 16 (05/04 0800) BP: (75-127)/(38-69) 101/53 (05/04 0800) SpO2:  [93 %-100 %] 100 % (05/04 0800) Arterial Line BP: (80-148)/(42-58) 120/48 (05/04 0800) FiO2 (%):  [90 %-100 %] 100 % (05/04 0738) Weight:  [109.6 kg-109.9 kg] 109.9 kg (05/04 0500) Last BM Date:  (pta)  Intake/Output from previous day: 05/03 0701 - 05/04 0700 In: 6634.4 [I.V.:4724; IV Piggyback:1910.4] Out: 4801 [Urine:11; Drains:690] Intake/Output this shift: Total I/O In: 269.9 [I.V.:219.9; IV Piggyback:50] Out: 266 [Other:266]  PE: General: resting comfortably, NAD Neuro: alert and oriented  HEENT: trach in place, site is clean Resp: trach in place, on vent Vent Mode: PRVC FiO2 (%):  [90 %-100 %] 100 % Set Rate:  [24 bmp-28 bmp] 24 bmp Vt Set:  [616 mL-650 mL] 650 mL PEEP:  [8 cmH20] 8 cmH20 Plateau Pressure:  [26 cmH20-36 cmH20] 36 cmH20 CV: RRR Abdomen: soft, nondistended, vac in place on midline wound draining blood-tinged fluid. RUQ JP x1 with old blood and bile-tinged fluid. Biliary drain with bile. LUQ perc drain to gravity drainage with cloudy brown fluid. Pancreatic stent draining clear colorless fluid. G tube in place to gravity drainage, draining clear nonbloody gastric contents. Extremities: warm and well-perfused  Lab Results:  Recent Labs    11/11/20 1448 11/11/20 2130 11/12/20 0413 11/12/20 0519  WBC 36.7*  --  25.1*  --   HGB 9.3*   < > 9.0* 10.2*  HCT 29.1*   < > 28.4* 30.0*  PLT 385  --  248  --    < > = values in this interval not displayed.   BMET Recent Labs    11/11/20 2359 11/12/20 0043 11/12/20 0413 11/12/20 0519  NA 134*   < > 136 138  K 4.4   < > 4.2 4.1  CL 96*   --  96*  --   CO2 13*  --  18*  --   GLUCOSE 355*  --  399*  --   BUN 73*  --  66*  --   CREATININE 4.31*  --  3.77*  --   CALCIUM 7.6*  --  7.6*  --    < > = values in this interval not displayed.   PT/INR Recent Labs    11/10/20 0501  LABPROT 16.6*  INR 1.3*   CMP     Component Value Date/Time   NA 138 11/12/2020 0519   NA 142 07/21/2020 0921   K 4.1 11/12/2020 0519   CL 96 (L) 11/12/2020 0413   CO2 18 (L) 11/12/2020 0413   GLUCOSE 399 (H) 11/12/2020 0413   BUN 66 (H) 11/12/2020 0413   BUN 16 07/21/2020 0921   CREATININE 3.77 (H) 11/12/2020 0413   CREATININE 1.16 03/01/2016 1607   CALCIUM 7.6 (L) 11/12/2020 0413   PROT 8.2 (H) 11/11/2020 1448   PROT 6.7 07/21/2020 0921   ALBUMIN 1.3 (L) 11/12/2020 0413   ALBUMIN 4.1 07/21/2020 0921   AST 178 (H) 11/11/2020 1448   ALT 156 (H) 11/11/2020 1448   ALKPHOS 90 11/11/2020 1448   BILITOT 5.7 (H) 11/11/2020 1448   BILITOT 0.8 07/21/2020 0921   GFRNONAA 19 (L) 11/12/2020 0413   GFRNONAA >89  09/22/2015 1008   GFRAA 82 07/21/2020 0921   GFRAA >89 09/22/2015 1008   Lipase     Component Value Date/Time   LIPASE 23 08/22/2020 0318       Assessment/Plan 50 yo male 6 month s/p Whipple, right colectomy and SMV repair for T3N0 neuroendocrine tumor. C/b postop hemorrhagic shock with DIC and subsequent diffuse small bowel necrosis, now s/p small bowel resection with external drainage of bile duct, pancreatic duct and stomach. - Continue broad-spectrum antibiotics and antifungal. Cultures negative to date. CXR unchanged, consistent with multifocal pneumonia. - Abdominal drains are all functioning. - CRRT - Continue TPN - Fentanyl for pain control - VTE: SQH - Dispo: ICU. Overall prognosis remains poor. Appreciate CCM assistance with [place] discussions. After discussion with family yesterday, patient is now DNR with no further escalation of current measures. Plan to consult palliative today for ongoing goals of care discussions.  Patient seems to be clinically improving today but remains critically ill and even if he recovers from this episode of sepsis, he will remain at high risk for further complications including pneumonia, bacteremia, etc.   LOS: 51 days    Michaelle Birks, MD [facility] Surgery General, Hepatobiliary and Pancreatic Surgery 11/12/20 8:34 AM

## 2020-11-12 NOTE — Progress Notes (Signed)
[name], MRN:  [id number], DOB:  03-18-1971, LOS: 73 ADMISSION DATE:  09/20/2020, CONSULTATION DATE:  11/12/20 REFERRING MD:  Anesthesia, CHIEF COMPLAINT:  Whipple, post-op shock   Brief History:  50 y.o. M with PMH of neuroendocrine tumor and underwent Whipple procedure 3/14 with intra-operative SMV laceration and repair by vascular with bovine pericardial patch.  Pt had worsening shock overnight and required three pressors despite massive transfusion protocol (received 150 blood products).  He was taken back to the OR for washout on 3/15 and returned to the ICU intubated.  Taken back 3/17 again found to have small bowel necrosis s/p resection.  Went back for ex-lap on 3/19, noted to have necrosis of nearly entirety of bowel, no bowel resected.  Fascia closed.  Case was discussed with family and [facility] transplant surgery for consideration of eventual small bowel transplant.  Electively returned to the OR on 3/21 for resection of remaining necrotic small bowel removed, G-tube placed, JP drains in place (2). Tracheostomy ws performed by Dr. Bobbye Morton on 4/8.  Started CRRT for AKI on 4/9.  See details below regarding complex hospitalization.   Past Medical History:  Neuroendocrine tumor of pancreas, resected 09/13/2020, negative surgical margins, lymph nodes negative Hypertension Chronic Systolic heart failure LVEF 35% in 2011, 2022 LVEF 50%, RVSP normal, LA dilated Non-ischemic cardioyopathy Hyperlipidemia V-fib with arrest, AICD placed in [place] [facility] Events:  3/14: Admit to Surgery, to OR for whipple, SMV injury and repair, massive transfusion protocol overnight  3/15: Back to OR for washout, x2. IR for arteriogram. No major bleed source identified in OR, or arterial bleed w IR. Robust product resuscitation >75 products (+ TXA + novoseven) 3/16: off pressors, add'l 39 products overnight. Slowing resuscitation this morning with hemodynamic improvements and slowing drain  output; OR x 2 - washout, biliary t tube, wound vac -- washout, wound vac, IR for arteriogram -- no arterial bleed for embolization 3/17:  Aggressive balanced transfusion continue overnight with marked hemodynamic improvement since yesterday evening. Off pressors x several hours, Wound vac output significantly slowing (from 541ml q15-30min to 573ml q1hr+) and output is much thinner, Thin bloody secretions from mouth approx 255ml, Decreased RR from 22 to 18, Unable to tolerate NE- severe bradycardia 3/19 taken back to OR. Small bowel noted to be almost completely necrosed. Patient was closed with plans for goals of car discussion.  3/20 [place] family requests full scope of treatment. Primary team discussed with [facility] the possibility of transfer for small bowel transplant. [facility] felt as though transfer would not change outcome. 3/21 Ongoing discussion with family and [facility]. Patient may be a candidate for transplant if he can stabilize post a small bowel resection. He was taken back to the OR and small bowel was resected.  3/23 weaning pressors. Versed off. Remains encephalopathic 3/24 off pressors. Low dose dilaudid gtt -- only weakly grimaces to pain. Changing sedation to precedex + PRN fentanyl. Long discussion with 2 brothers regarding clinical case -- tried to clarify that while the term "stable" has been used, in this instance is meaning that he has not declined from previous shift but is in fact still critically ill with multisystem organ dysfunction.  3/25 Cr and BUN increased. Off sedation  3/26 Pressors off, CT head benign, BUN/Cr continue to creep up 3/27 Tachycardia/HTN overnight improved with fentanyl gtt 3/28 PCCM sign off. Trauma to take over vent/CCM needs.  3/30 extubated 4/2 desaturated overnight to the 30s improved with BVM and NTS.  4/5 poor airway protection. PCCM consulted for re-intubation.  4/6 CT a/p Large RLL opacity, smaller LLL opacity. Post op changes. 5.4cm fluid collection in  posterior pelvis, concerning for abscess. Started on Zosyn  4/7 got transfused overnight and then diuresed. Worse renal function 4/8 Tracheostomy performed by general surgery 4/9 transfused again. HD line inserted and started on CRRT 4/10 started on trach collar trials. 4/12 off CRRT and transitioned to IHD. Surgery placed abdominal drain with minimal output-- during procedure seemed c/w hematoma however since grew out klebsiella  4/13, 4/14 Tolerated dialysis 4/15 Placed on vent overnight for rest 4/16 Tolerating trach collar 4/17 got HD. Trach downsized to 6 cuffless, trach dislodged overnight, replaced with #6 cuffless shiley.  4/18 afebrile, trach collar + PMV. Up to chair with PT, PCCM signed off 4/21 decannulated; called back for hypotension, Afib/RVR, increased output from his 5 drains and wound vac.Transfused 2 units, re-cannulated, on Levo and Neo.  ICD fired x2, cardiology consulted 4/24 Continues to tolerate trach collar, 5L 28% 4/29 fever overnight, no complaints, remains on tracheostomy collar 4/30-5/1 fever continued, WBC unchanged, blood cultures sent, zosyn restarted 5/3 worsening shock, hypoxemia due to HCAP, back on ventilator, CRRT started again, made DNR, turned off ICD   Consults:  PCCM VVS  IR  Palliative Cardiology Nephrology  Procedures:  PICC 3/25 > JP x 2  Biliary drain Pancreatic stent drain ETT 2/24>>3/13; 3/15 >> 3/29;  4/5>>4/8 Trach 4/8> L lateral abdominal drain 4/12>  Significant Diagnostic Tests:  See radiology tab Most recent significant imaging:  4/12 CT abd/pelvis>>  Unchanged ill-defined fluid collections within the left side of the root of the abdominal mesentery, the ventral aspect of the abdominal mesentery, the subcapsular aspect of the right lobe of the liver and the pelvic cul-de-sac all of which are incompletely evaluated on this noncontrast examination though may represent areas of evolving hematomas giving imaging stability for the  past week. Redemonstrated ill-defined stranding surrounding the remaining portions of the pancreas without definable/drainable fluid collection on this noncontrast examination.  Micro Data:  See micro tab Most recent significant findings:   4/5 trach asp >> Klebsiella pneumoniae 4/12 Pelvic abscess >> ampicillin resistant Klebsiella pneumoniae 4/12 BCx> no growth   5/1 blood cultures 5/3 resp culture >  Antimicrobials:  Zosyn 3/21 > 3/29; 4/5 >4/19; 5/1 >  Eraxis 3/23> 3/29, 4/12>4/19   Interim History / Subjective:   Had tracheostomy changed overnight Mixed metabolic and resp acidosis  Pressors weaning Remains on CRRT and mechanical ventilation Made DNR yesterday Turned off ICD  Objective   Blood pressure (!) 103/55, pulse 73, temperature 98.4 F (36.9 C), temperature source Axillary, resp. rate (!) 24, height 6\' 2"  (1.88 m), weight 109.9 kg, SpO2 100 %. CVP:  [12 mmHg-23 mmHg] 18 mmHg  Vent Mode: PRVC FiO2 (%):  [90 %-100 %] 100 % Set Rate:  [24 bmp-28 bmp] 24 bmp Vt Set:  [616 mL-650 mL] 650 mL PEEP:  [8 cmH20] 8 cmH20 Plateau Pressure:  [26 cmH20-36 cmH20] 36 cmH20   Intake/Output Summary (Last 24 hours) at 11/12/2020 0801 Last data filed at 11/12/2020 0700 Gross per 24 hour  Intake 6634.39 ml  Output 4801 ml  Net 1833.39 ml   Filed Weights   11/06/20 1530 11/11/20 0906 11/12/20 0500  Weight: 96 kg 109.6 kg 109.9 kg   Physical Exam:  General:  In bed on vent HENT: NCAT tracheostomy in place PULM: CTA B, vent supported breathing CV: RRR, no mgr GI: no bowel  sounds, wound vac in place, multiple drains in place, GJ in place MSK: normal bulk and tone Neuro: sedated on vent     Resolved Hospital Problem list   Hemorrhagic shock hypophosphatemia Septic shock  Klebsiella pneumoniae pneumonia Acute hypoxemic respiratory failure requiring mechanical ventilation  Assessment & Plan:   Acute respiratory failure with hypoxia > worsened due to HCAP S/p  Tracheostomy  Full mechanical vent support VAP prevention Daily WUA/SBT OK to attempt SBT today  PAF  Systolic heart failure  ICD in place, turned off defib function on 5/3 Tele Amiodarone on hold  Septic shock: hemodynamics improved on 5/4 Stop hydrocortisone on 5/4 Wean off levophed Continue vasopressin   HCAP Multiple abdominal abscesses Possible fungemia Continue current management ABG prn F/u blood and bronch cultures Continue current antibiotics  No small bowel: not a candidate for small bowel transplant TPN per pharmacy and CCS  AKI now on HD Severe metabolic acidosis: most likely due to hypoperfusion from septic shock Hyperkalemia> improved post CRRT Continue CRRT   Daily Goals Checklist  Per primary Code Status: DNR, don't escalate care; multiple conversations with family on 5/3, see notes.  Will consult palliative care to continue working with family and provide continuity in communication.    My cc time 35 minutes  Roselie Awkward, MD [place] [facility] Pager: [phone] Cell: [phone] If no response, please call [phone] until 7pm After 7:00 pm call Elink  [phone]   11/12/2020, 8:01 AM

## 2020-11-12 NOTE — Progress Notes (Addendum)
Daily Progress Note   Patient Name: [name]       Date: 11/12/2020 DOB: 1971/02/07  Age: 50 y.o. MRN#: [id number] Attending Physician: [name], MD Primary Care Physician: Gildardo Pounds, NP Admit Date: 10/01/2020  Reason for Consultation/Follow-up: Establishing goals of care  Subjective: Palliative previously consulted on patient early in admission. Was called by Dr. Lake Bells today to readdress [place].  Has recurrent HCAP with worsening shock yesterday. Patient is back on ventilator support. He is on CRRT. Dependant on TPN for nutrition.   I attempted to call patient's spouse with no answer.  His friend [name] (best friend), brother [name], and friend [name] were in the waiting area.  I approached them regarding setting up a time to meet tomorrow for followup from their meeting with Dr. Lake Bells yesterday. They were not interested in setting up a meeting- questioning the reasoning for Palliative team to be involved. They stated that they had previously spoken with Palliative team and goals of care were established at that time for all medical interventions possible. They inquired if there was an update or change in medical status- they noted that their discussion with primary team this morning indicated patient was improving.  Attempts were made to encourage family to allow for discussion of overall goals of care and for support.  Family was very clear that their religion will not allow them to consent to stopping any current life prolonging interventions. They feel that patient will die when the time is right no matter what interventions are provided.  They continue to endorse their feelings that patient would improve to a point of being transferred to [facility]. I attempted to discuss with  them that patient is not likely to get to be eligible for transplant- they were not receptive.     Review of Systems  Unable to perform ROS: Acuity of condition    Length of Stay: 51  Physical Exam Vitals and nursing note reviewed.  Constitutional:      Appearance: He is ill-appearing and diaphoretic.  Pulmonary:     Comments: Trach, vent Neurological:     Comments: Did not respond to my voice or touch                   Palliative Care Assessment & Plan   Patient  Profile: 50 y.o.M with PMH of neuroendocrine tumor and underwent Whipple procedure 3/14 with intra-operative SMV laceration and repairby vascular with bovine pericardial patch. Pt had worsening shock overnight and required three pressors despite massive transfusion protocol (received 150 blood products). He was taken back to the OR for washout on 3/15 and returned to the ICU intubated. Taken back 3/17 again found to have small bowel necrosis s/p resection. Plans again to return 3/19 to OR.Palliative medicine consulted for goals of care- at that time- family agreed to DNR, but otherwise chose continued aggressive medical care. DNR was later reversed. He continues to be vent dependent, has not improved renal function. He was making incremental progress towards CIR, however, had another setback with septic shock. He has been made DNR again per discussion with PCCM. Palliative reconsulted for assistance with GOC.   Assessment/Recommendations/Plan  Family maintains [place] for continued life sustaining care- artificial life support, dialysis They were very clear regarding their feelings that they or anyone else would be taking patient's life if life support or dialysis or any medical interventions were stopped.  Their belief is that patient will die when it is his time to die- discussed that patient is dying and interventions are preventing his dying- however, family believes that he will die if it is his time even with life  support interventions and therefore all life support should be continued and it is acceptable if he dies while he is on life support Given their very deep rooted religious and cultural beliefs- I do not believe that family will set limits on their own- recommend providers communicate honestly with family regarding potential for transplant, and not offer interventions that are not going to cure patient or improve his functional status- family is unconcerned with suffering due to the belief that suffering on earth is rewarded after death  Support proceeding with futility policy if providers feel that with a high degree of medical certainty that this patient has an incurable and irreversible condition that will result in their death within a relatively short period of time. All medically appropriate and reasonable medical interventions (and more) have and are being done according to accepted practice and standards of care.  In the event of a cardiac or respiratory arrest, an attempt at resuscitation would be unsuccessful at returning this patient to an already fragile and seriously ill state of health and cause unnecessary suffering at the end of his life. I support placing and maintaining a DNR order and support the care plan as outlined by the primary service.   Care plan was discussed with patient's family and care team  Thank you for allowing the Palliative Medicine Team to assist in the care of this patient.   Total time: 68 mins  Greater than 50%  of this time was spent counseling and coordinating care related to the above assessment and plan.  Mariana Kaufman, AGNP-C Palliative Medicine   Please contact Palliative Medicine Team phone at [phone] for questions and concerns.

## 2020-11-12 NOTE — Progress Notes (Signed)
[facility] NEPHROLOGY PROGRESS NOTE  Assessment/ Plan: Pt is a 50 y.o. yo male with neuroendocrine tumor status post Whipple procedure right colectomy and SMV repair, developed AKI on dialysis.  #AKI HD dependent, prolonged since 4/9. ATN.  No evidence of recovery and sig decompensation 5/3 req back on CRRT.  All 4K, Net even UF, no anticoagulation.  Agree that prognosis has gone from very poor to worse in past 24h, support palliative involvement and GOC with family.   #Acute respiratory failure required mechanical ventilation. Back on Vent, per CCM. [place] HCAP  #Neuroendocrine tumor status post Whipple procedure with extensive small bowel necrosis status post resection.    #Severe protein calorie malnutrition, hypoalbuminemia  #Anemia: Hemoglobin stable.  Transfuse PRBC as needed.  Per CCM and CCS  #Hypotension: back in severe septic shock on 3 pressors.    # Hyperkalemia, as above  # metabolic and resp acidosis, as above.    Subjective:   Hypotension and metabolic issues improved overnight  Still high FIO2  CCM/CCS working on [place] with family, palliative consulted  Now all 4K bath, Net even UF, CRRT running well, K 4.1, P 2.5  Objective Vital signs in last 24 hours: Vitals:   11/12/20 1100 11/12/20 1142 11/12/20 1143 11/12/20 1200  BP: (!) 98/53 (!) 100/56  (!) 106/56  Pulse: 69 70  70  Resp: (!) 21 (!) 24  (!) 27  Temp:    97.9 F (36.6 C)  TempSrc:    Oral  SpO2: 100% 100% 100% 100%  Weight:      Height:       Weight change:   Intake/Output Summary (Last 24 hours) at 11/12/2020 1253 Last data filed at 11/12/2020 1200 Gross per 24 hour  Intake 4826.89 ml  Output 5494 ml  Net -667.11 ml       Labs: Basic Metabolic Panel: Recent Labs  Lab 11/11/20 0739 11/11/20 1448 11/11/20 2130 11/11/20 2359 11/12/20 0043 11/12/20 0413 11/12/20 0438 11/12/20 0519  NA 136 135   < > 134*   < > 136 135 138  K 6.7* 5.7*   < > 4.4   < > 4.2 4.1 4.1   CL 100 100  --  96*  --  96*  --   --   CO2 15* 14*  --  13*  --  18*  --   --   GLUCOSE 336* 174*  --  355*  --  399*  --   --   BUN 121* 96*  --  73*  --  66*  --   --   CREATININE 6.88* 5.63*  --  4.31*  --  3.77*  --   --   CALCIUM 8.1* 8.3*  --  7.6*  --  7.6*  --   --   PHOS 7.5* 6.8*  --   --   --  6.0*  --   --    < > = values in this interval not displayed.   Liver Function Tests: Recent Labs  Lab 11/06/20 0651 11/07/20 0522 11/10/20 0501 11/11/20 0739 11/11/20 1448 11/12/20 0413  AST 143*  --  83*  --  178*  --   ALT 164*  --  142*  --  156*  --   ALKPHOS 98  --  66  --  90  --   BILITOT 2.9*  --  3.7*  --  5.7*  --   PROT 6.5  --  6.7  --  8.2*  --   ALBUMIN 1.2*   < > 1.4*  1.3* 1.3* 1.5*  1.5* 1.3*   < > = values in this interval not displayed.   No results for input(s): LIPASE, AMYLASE in the last 168 hours. No results for input(s): AMMONIA in the last 168 hours. CBC: Recent Labs  Lab 11/09/20 0500 11/10/20 0501 11/11/20 0300 11/11/20 0336 11/11/20 1448 11/11/20 2130 11/12/20 0413 11/12/20 0438 11/12/20 0519  WBC 8.8 8.6 24.1*  --  36.7*  --  25.1*  --   --   NEUTROABS  --  6.3  --   --   --   --   --   --   --   HGB 8.4* 8.5* 8.9*   < > 9.3*   < > 9.0* 10.2* 10.2*  HCT 25.2* 25.9* 28.3*   < > 29.1*   < > 28.4* 30.0* 30.0*  MCV 78.8* 80.2 82.3  --  80.8  --  82.3  --   --   PLT 210 192 309  --  385  --  248  --   --    < > = values in this interval not displayed.   Cardiac Enzymes: No results for input(s): CKTOTAL, CKMB, CKMBINDEX, TROPONINI in the last 168 hours. CBG: Recent Labs  Lab 11/11/20 1343 11/11/20 1605 11/11/20 2001 11/12/20 0154 11/12/20 1217  GLUCAP 165* 150* 233* 367* 249*    Iron Studies: No results for input(s): IRON, TIBC, TRANSFERRIN, FERRITIN in the last 72 hours. Studies/Results: DG Chest Port 1 View  Result Date: 11/12/2020 CLINICAL DATA:  Respiratory failure. EXAM: PORTABLE CHEST 1 VIEW COMPARISON:  11/11/2020.  FINDINGS: Tracheostomy tube, right IJ line, right PICC line in stable position. Cardiac pacer stable position. Stable cardiomegaly. Diffuse bilateral pulmonary infiltrates/edema again noted without interim change. No prominent pleural effusion. No pneumothorax. IMPRESSION: 1.  Lines and tubes in stable position. 2.  Cardiac pacer stable position.  Stable cardiomegaly. 3. Diffuse bilateral pulmonary infiltrates/edema again noted without interim change. Electronically Signed   By: Marcello Moores  Register   On: 11/12/2020 05:57   DG CHEST PORT 1 VIEW  Result Date: 11/11/2020 CLINICAL DATA:  Tracheostomy EXAM: PORTABLE CHEST 1 VIEW COMPARISON:  11/11/2020 FINDINGS: Single frontal view of the chest demonstrates a tracheostomy tube overlying tracheal air column tip just below thoracic inlet. Right internal jugular catheter and right-sided PICC line both overlie the superior vena cava. Single lead AICD unchanged. The cardiac silhouette is stable. Persistent multifocal bilateral airspace disease, right greater than left. No significant change since prior study. No effusion or pneumothorax. No acute bony abnormalities. IMPRESSION: 1. Support devices as above. 2. Multifocal bilateral airspace disease asymmetric on the right, grossly stable since prior study. Electronically Signed   By: Randa Ngo M.D.   On: 11/11/2020 21:52   DG CHEST PORT 1 VIEW  Result Date: 11/11/2020 CLINICAL DATA:  Tracheostomy tube EXAM: PORTABLE CHEST 1 VIEW COMPARISON:  Yesterday FINDINGS: Right IJ line with tip at the upper SVC. Right PICC with tip at the lower SVC. Tracheostomy tube in place. Single chamber defibrillator lead. Dense airspace disease asymmetric to the right which has progressed. No visible effusion or air leak. Cardiomegaly. IMPRESSION: 1. Worsening airspace disease on the right. 2. No specific findings related to interval tracheostomy tube change. Electronically Signed   By: Monte Fantasia M.D.   On: 11/11/2020 05:02     Medications: Infusions: .  prismasol BGK 4/2.5 1,000 mL/hr at 11/12/20 1131  .  prismasol BGK 4/2.5 500 mL/hr at 11/12/20 0624  . sodium chloride Stopped (11/12/20 1038)  . sodium chloride Stopped (11/08/20 1119)  . sodium chloride    . sodium chloride 0 mL (11/10/20 0701)  . sodium chloride    . amiodarone Stopped (11/11/20 0246)  . anidulafungin 100 mg (11/12/20 1038)  . epinephrine Stopped (11/12/20 0446)  . fentaNYL infusion INTRAVENOUS 50 mcg/hr (11/12/20 1200)  . norepinephrine (LEVOPHED) Adult infusion 15 mcg/min (11/12/20 1200)  . piperacillin-tazobactam Stopped (11/12/20 0746)  . prismasol BGK 4/2.5 1,500 mL/hr at 11/12/20 0943  . promethazine (PHENERGAN) injection (IM or IVPB) Stopped (11/10/20 1538)  . TPN ADULT (ION)    . vancomycin Stopped (11/12/20 1034)  . vasopressin 0.03 Units/min (11/12/20 1200)    Scheduled Medications: . chlorhexidine gluconate (MEDLINE KIT)  15 mL Mouth Rinse BID  . Chlorhexidine Gluconate Cloth  6 each Topical Q0600  . fentaNYL  1 patch Transdermal Q72H  . heparin injection (subcutaneous)  5,000 Units Subcutaneous Q8H  . hydrocortisone sodium succinate  100 mg Intravenous Q8H  . insulin aspart  0-9 Units Subcutaneous Q4H  . lidocaine  5 mL Intradermal Once  . mouth rinse  15 mL Mouth Rinse 10 times per day  . pantoprazole (PROTONIX) IV  40 mg Intravenous BID  . sodium chloride flush  10-40 mL Intracatheter Q12H  . sodium chloride flush  5 mL Intracatheter Q8H    have reviewed scheduled and prn medications.  Physical Exam: General: Lying in bed, on vent Heart:RRR, s1s2 nl Lungs: Clear anteriorly Abdomen:soft, non-distended Extremities: Trace LE edema Dialysis Access: IJ HD temp catheter in place.  Marlean Mortell B Madonna Flegal 11/12/2020,12:53 PM  LOS: 51 days

## 2020-11-12 NOTE — Progress Notes (Signed)
PHARMACY - TOTAL PARENTERAL NUTRITION CONSULT NOTE  Indication:  Short bowel syndrome  Patient Measurements: (updated) Height: 6\' 2"  (188 cm) Weight: 97.4 kg (214 lb 11.7 oz) IBW/kg (Calculated) : 82.2 TPN AdjBW (KG): 95.3 Body mass index is 27.57 kg/m.  Assessment:  39 YOM with history of mass adjacent to head of the pancreas in 2021. 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  Developed cholecystitis in Feb 2022 and had percutaneous cholecystectomy tube placement.  Presented this admission for neuroendocrine tumor resection with Whipple procedure, also had right total colectomy, colostomy and cholecystectomy on 10/07/2020. SMV was lacerated during procedure and repaired by bovine pericardial patch. Developed hemorrhagic shock with bile leak post-op and underwent placement of biliary T tube with abdominal washout on 3/15 PM. Antimicrobial therapies discontinued 4/20. Pharmacy consulted to manage TPN.   Glucose / Insulin: no hx DM, A1c 5.2%. CBGs up to 300's while on TPN cycle  Utilized 20 unit sSSI in the past 24 hours and 60 units in TPN Electrolytes: Transitioned back to CRRT on 5/3 - K normalized on 2K bags, now back on 4K bags, PHos down to 6, Mg 2.5, CO2 18, iCa 1.02 Renal: CRRT started 4/9 > 4/11 > iHD > CRRT on 5/3  Hepatic: LFTs mildly elevated, Tbili 3.7, albumin 1.3, prealbumin 14.8 >7.8  TG peaked at 740 (3/26) - last value 153 (5/2)  Intake / Output; MIVF: minimal UOP, drains (5 total): 690 mL, net +20.4L GI Imaging: 4/6 CT - pelvic fluid collection concerning for abscess 4/12 CT - no new fluid collections 4/28 CT - new fluid+gas collection in RLQ GI Surgeries / Procedures: 3/14 Whipple procedure with R hemicolectomy and end ileostomy, cholecystectomy 3/15 washout, VAC placement 3/17 washout, VAC replacement.  Necrosis of entire small bowel except for the PB limb and proximal 40-50cm of jejunum 3/19 re-opening laparotomy, abd closure.  Necrotic  SB. 3/21 SB resection, take down of choledochojejunal anastomosis and pancreaticojejunal anastomosis, take down of duodenojejunal anastomosis, placement of externalized biliary drain / Stamm gastrostomy tube, IA washout and placement of intraperitoneal drains 3/30 extubation> 4/8 trach 4/12 left lateral abdominal drain placed by IR  Central access: R IJ CVC placed 3/15; changed to PICC placed 10/03/20 TPN start date: 10/03/2020  Nutritional Goals (RD rec on 4/21): 2800-3000 kCal, 185-210g AA, fluid >2L/day -on lipid days (MWF): 3120 kcal with 205g protein, 528g CHO, 50g SMOF lipid -on NON-lipid days (TThSaSun): 2618kcal, 528g CHO, 205g protein   Current Nutrition:  TPN  Plan: - Stop cycling TPN and return to 24 hour continuous TPN infusion at goal rate of 150 mL/hr  - Continue 19g/L SMOFlipid on MWF due to elevated TG - TPN provides a weekly avg of 2833 kCal and 205g AA per day, meeting 100% of needs - Electrolytes in TPN: Na 112mEq/L, no K, add back Ca 5 meQ/L, continue Mg 5 mEq/L, Continue Phos 52mmol/L, continue max acetate  - Daily multivitamin in TPN. Remove standard trace elements and add back zinc 5mg  and selenium 73mcg. Remove chromium while on RRT. - Switch back to standard sSSI - Continue 60 units insulin regular in TPN  - Standard TPN labs on Mon/Thurs  Albertina Parr, PharmD., BCPS, BCCCP Clinical Pharmacist Please refer to [facility] for unit-specific pharmacist

## 2020-11-12 NOTE — Progress Notes (Signed)
PT Cancellation Note  Patient Details Name: MUSAB WINGARD MRN: [id number] DOB: Dec 05, 1970   Cancelled Treatment:    Reason Eval/Treat Not Completed: Medical issues which prohibited therapy;Patient not medically ready. RN requesting hold on PT this date as pt is not medically stable enough to mobilize. Will follow-up another day as appropriate.   [name], PT, DPT Acute Rehabilitation Services  Pager: [phone] Office: [place] 11/12/2020, 8:12 AM

## 2020-11-12 NOTE — Progress Notes (Addendum)
Referring Physician(s): Dr. Zenia Resides, [facility])  Supervising Physician: [name]  Patient Status:  [facility] - In-pt  Chief Complaint:  S/p 10 French left lateral drain placement on 4/12 with Dr. Pascal Lux and Thora Lance to 69 [place] on 5/2 with Dr. Vernard Gambles   Subjective:  Patient laying in bed, on CRRT.  Patient appears lethargic, keep falling a sleep during assessment. Patient's family members at the bedside.    Allergies: Ace inhibitors  Medications: Prior to Admission medications   Medication Sig Start Date End Date Taking? Authorizing Provider  acetaminophen (TYLENOL) 500 MG tablet Take 2 tablets (1,000 mg total) by mouth every 6 (six) hours. Patient taking differently: Take 1,000 mg by mouth every 6 (six) hours as needed for mild pain. 08/22/20  Yes [name], MD  allopurinol (ZYLOPRIM) 100 MG tablet Take one tablet by mouth daily for 1 week and then 2 tablets by mouth daily Patient taking differently: Take 200 mg by mouth daily. 07/03/20  Yes Swords, Darrick Penna, MD  amiodarone (PACERONE) 200 MG tablet Take 1 tablet (200 mg total) by mouth daily. Patient taking differently: Take 200 mg by mouth daily with supper. 07/30/20  Yes Evans Lance, MD  aspirin 81 MG tablet Take 1 tablet (81 mg total) by mouth 2 (two) times daily. Patient taking differently: Take 81 mg by mouth daily. 09/22/15  Yes Funches, Josalyn, MD  bisoprolol (ZEBETA) 10 MG tablet Take 1 tablet by mouth once daily Patient taking differently: Take 10 mg by mouth daily. 08/25/20  Yes Evans Lance, MD  budesonide-formoterol [facility]) 80-4.5 MCG/ACT inhaler Inhale 2 puffs into the lungs 2 (two) times daily. Must keep upcoming office visit for refills Patient taking differently: Inhale 2 puffs into the lungs 2 (two) times daily as needed (for flares). 11/09/19  Yes Fulp, Cammie, MD  sacubitril-valsartan (ENTRESTO) 24-26 MG Take 1 tablet by mouth 2 (two) times daily. 06/25/20  Yes Evans Lance, MD  amiodarone  (PACERONE) 200 MG tablet TAKE 1 TABLET (200 MG TOTAL) BY MOUTH DAILY. 07/30/20 07/30/21  Evans Lance, MD  amiodarone (PACERONE) 200 MG tablet TAKE 1 TABLET (200 MG TOTAL) BY MOUTH DAILY. 11/15/19 11/14/20  Evans Lance, MD  bisoprolol (ZEBETA) 10 MG tablet TAKE 1 TABLET (10 MG TOTAL) BY MOUTH DAILY. 11/15/19 11/14/20  Evans Lance, MD  oxyCODONE (OXY IR/ROXICODONE) 5 MG immediate release tablet Take 1-2 tablets (5-10 mg total) by mouth every 6 (six) hours as needed for severe pain. Patient not taking: Reported on 09/10/2020 08/22/20   [name], MD     Vital Signs: BP (!) 103/52   Pulse 69   Temp 97.9 F (36.6 C) (Oral)   Resp (!) 24   Ht 6\' 2"  (1.88 m)   Wt 242 lb 4.6 oz (109.9 kg)   SpO2 100%   BMI 31.11 kg/m   Physical Exam Vitals reviewed.  Constitutional:      General: He is not in acute distress.    Appearance: He is ill-appearing.  HENT:     Head: Normocephalic and atraumatic.  Cardiovascular:     Rate and Rhythm: Normal rate.  Pulmonary:     Comments: On trach Abdominal:     Comments: Positive LUQ/left flank drain to a gravity bag. Site is unremarkable with no erythema, edema, tenderness, bleeding or drainage. Suture and stat lock in place. Dressing is clean, dry, and intact. Trace of brown colored fluid noted in the gravity bag. Drain aspirates and  flushes well.Clot/debires noted at the end of tubing.    Skin:    General: Skin is warm and dry.     Imaging: DG Chest Port 1 View  Result Date: 11/12/2020 CLINICAL DATA:  Respiratory failure. EXAM: PORTABLE CHEST 1 VIEW COMPARISON:  11/11/2020. FINDINGS: Tracheostomy tube, right IJ line, right PICC line in stable position. Cardiac pacer stable position. Stable cardiomegaly. Diffuse bilateral pulmonary infiltrates/edema again noted without interim change. No prominent pleural effusion. No pneumothorax. IMPRESSION: 1.  Lines and tubes in stable position. 2.  Cardiac pacer stable position.  Stable cardiomegaly. 3.  Diffuse bilateral pulmonary infiltrates/edema again noted without interim change. Electronically Signed   By: Marcello Moores  Register   On: 11/12/2020 05:57   DG CHEST PORT 1 VIEW  Result Date: 11/11/2020 CLINICAL DATA:  Tracheostomy EXAM: PORTABLE CHEST 1 VIEW COMPARISON:  11/11/2020 FINDINGS: Single frontal view of the chest demonstrates a tracheostomy tube overlying tracheal air column tip just below thoracic inlet. Right internal jugular catheter and right-sided PICC line both overlie the superior vena cava. Single lead AICD unchanged. The cardiac silhouette is stable. Persistent multifocal bilateral airspace disease, right greater than left. No significant change since prior study. No effusion or pneumothorax. No acute bony abnormalities. IMPRESSION: 1. Support devices as above. 2. Multifocal bilateral airspace disease asymmetric on the right, grossly stable since prior study. Electronically Signed   By: Randa Ngo M.D.   On: 11/11/2020 21:52   DG CHEST PORT 1 VIEW  Result Date: 11/11/2020 CLINICAL DATA:  Tracheostomy tube EXAM: PORTABLE CHEST 1 VIEW COMPARISON:  Yesterday FINDINGS: Right IJ line with tip at the upper SVC. Right PICC with tip at the lower SVC. Tracheostomy tube in place. Single chamber defibrillator lead. Dense airspace disease asymmetric to the right which has progressed. No visible effusion or air leak. Cardiomegaly. IMPRESSION: 1. Worsening airspace disease on the right. 2. No specific findings related to interval tracheostomy tube change. Electronically Signed   By: Monte Fantasia M.D.   On: 11/11/2020 05:02   DG CHEST PORT 1 VIEW  Result Date: 11/10/2020 CLINICAL DATA:  Fever, dyspnea, tracheostomy EXAM: PORTABLE CHEST 1 VIEW COMPARISON:  10/30/2020 chest radiograph. FINDINGS: Tracheostomy tube tip terminates over the tracheal air column at the level of the thoracic inlet. Right internal jugular central venous catheter terminates in the lower third of the SVC. Right PICC terminates  at the cavoatrial junction. Single lead left subclavian ICD with lead tip overlying the right ventricle. Stable cardiomediastinal silhouette with mild cardiomegaly. No pneumothorax. Slight blunting of the costophrenic angles bilaterally. Widespread patchy hazy opacities in both lungs, new. IMPRESSION: 1. Well-positioned tracheostomy tube. 2. New widespread patchy hazy opacities in both lungs, suspect multilobar pneumonia, although a component of cardiogenic pulmonary edema is not excluded given the cardiomegaly. 3. Slight blunting of the costophrenic angles, cannot exclude trace pleural effusions. Electronically Signed   By: Ilona Sorrel M.D.   On: 11/10/2020 14:52    Labs:  CBC: Recent Labs    11/10/20 0501 11/11/20 0300 11/11/20 0336 11/11/20 1448 11/11/20 2130 11/12/20 0043 11/12/20 0413 11/12/20 0438 11/12/20 0519  WBC 8.6 24.1*  --  36.7*  --   --  25.1*  --   --   HGB 8.5* 8.9*   < > 9.3*   < > 10.5* 9.0* 10.2* 10.2*  HCT 25.9* 28.3*   < > 29.1*   < > 31.0* 28.4* 30.0* 30.0*  PLT 192 309  --  385  --   --  248  --   --    < > = values in this interval not displayed.    COAGS: Recent Labs    10/08/2020 1947 09/24/20 0400 09/24/20 1650 10/06/2020 1909 09/10/2020 1624 11/05/20 0656 11/05/20 1835 11/07/20 0522 11/08/20 0559 11/09/20 0500 11/10/20 0501  INR 0.9   < > 1.4* 1.4*   < > 2.2*   < > 1.3* 1.3* 1.4* 1.3*  APTT 35  --  32 39*  --  44*  --   --   --   --   --    < > = values in this interval not displayed.    BMP: Recent Labs    12/03/19 1703 05/24/20 1956 07/03/20 1643 07/21/20 0921 08/21/20 1037 11/11/20 0739 11/11/20 1448 11/11/20 2130 11/11/20 2359 11/12/20 0043 11/12/20 0413 11/12/20 0438 11/12/20 0519  NA 144   < > 142 142   < > 136 135   < > 134* 135 136 135 138  K 4.6   < > 3.7 4.2   < > 6.7* 5.7*   < > 4.4 4.3 4.2 4.1 4.1  CL 109*   < > 103 104   < > 100 100  --  96*  --  96*  --   --   CO2 21   < > 25 27   < > 15* 14*  --  13*  --  18*  --    --   GLUCOSE 103*   < > 105* 83   < > 336* 174*  --  355*  --  399*  --   --   BUN 15   < > 18 16   < > 121* 96*  --  73*  --  66*  --   --   CALCIUM 9.5   < > 9.1 9.5   < > 8.1* 8.3*  --  7.6*  --  7.6*  --   --   CREATININE 1.27   < > 0.96 1.19   < > 6.88* 5.63*  --  4.31*  --  3.77*  --   --   GFRNONAA 66   < > 92 71   < > 9* 12*  --  16*  --  19*  --   --   GFRAA 77  --  107 82  --   --   --   --   --   --   --   --   --    < > = values in this interval not displayed.    LIVER FUNCTION TESTS: Recent Labs    11/03/20 0519 11/04/20 0553 11/06/20 0651 11/07/20 0522 11/10/20 0501 11/11/20 0739 11/11/20 1448 11/12/20 0413  BILITOT 2.7*  --  2.9*  --  3.7*  --  5.7*  --   AST 103*  --  143*  --  83*  --  178*  --   ALT 126*  --  164*  --  142*  --  156*  --   ALKPHOS 108  --  98  --  66  --  90  --   PROT 6.4*  --  6.5  --  6.7  --  8.2*  --   ALBUMIN 1.2*   < > 1.2*   < > 1.4*  1.3* 1.3* 1.5*  1.5* 1.3*   < > = values in this interval not displayed.    Assessment and Plan:  S/p 10 [place]  left lateral drain placement on 4/12 with Dr. Pascal Lux and Thora Lance to 83 [place] on 5/2 with Dr. Vernard Gambles   Patient appears lethargic.  Vitals: tachypnea.  CBC: WBC trending down, Hgb droped from 10.5 to 9  Drain flushes and aspirates well. Site clean, dry, dressing and drain intact.  Clot/debries noted at the end of tubing, tubing and the gravity bag exchanged at the bedside. Immediate return of dark red colored fluid noted in the tubing after exchange.  RN states that there had been none to minimal output noted for couple days in the gravity bag.   Discussed with Dr. Pascal Lux for possible follow up CT as OP has been minimal to 5 mL x 2 days.  Dr.Watts stated that follow up CT scan is not indicated as the patient whishes no escalation of care, his code status is DNR.    Continue with flushing TID, output recording q shift and dressing changes as needed.   Further treatment plan per CCS/PCCM/  Cardiology/ Nephrology  Appreciate and agree with the plan.  IR to follow.    Electronically Signed: Tera Mater, PA-C 11/12/2020, 2:55 PM   I spent a total of 25 Minutes at the the patient's bedside AND on the patient's hospital floor or unit, greater than 50% of which was counseling/coordinating care for LUQ/ drain

## 2020-11-12 NOTE — Progress Notes (Signed)
Paged nephrology about patient's potassium level coming down from 6.7 yesterday to 4.2 this morning. Patient's CRRT dialysate fluids are currently 2K 2.5Ca. New orders placed to change fluids to 4K 2.5Ca. Also mentioned the bicarb levels tonight being 10.3, 14.2, and then 20.9 after 5 amps of bicarb from CCM. Nephrology did not write any new orders for bicarb replacement since level is now 20.9.

## 2020-11-13 ENCOUNTER — Inpatient Hospital Stay (HOSPITAL_COMMUNITY): Payer: Managed Care, Other (non HMO)

## 2020-11-13 DIAGNOSIS — D3A8 Other benign neuroendocrine tumors: Secondary | ICD-10-CM | POA: Diagnosis not present

## 2020-11-13 LAB — GLUCOSE, CAPILLARY
Glucose-Capillary: 264 mg/dL — ABNORMAL HIGH (ref 70–99)
Glucose-Capillary: 282 mg/dL — ABNORMAL HIGH (ref 70–99)
Glucose-Capillary: 299 mg/dL — ABNORMAL HIGH (ref 70–99)
Glucose-Capillary: 303 mg/dL — ABNORMAL HIGH (ref 70–99)
Glucose-Capillary: 309 mg/dL — ABNORMAL HIGH (ref 70–99)
Glucose-Capillary: 309 mg/dL — ABNORMAL HIGH (ref 70–99)

## 2020-11-13 LAB — CBC WITH DIFFERENTIAL/PLATELET
Abs Immature Granulocytes: 0.1 10*3/uL — ABNORMAL HIGH (ref 0.00–0.07)
Abs Immature Granulocytes: 0.88 10*3/uL — ABNORMAL HIGH (ref 0.00–0.07)
Basophils Absolute: 0 10*3/uL (ref 0.0–0.1)
Basophils Absolute: 0 10*3/uL (ref 0.0–0.1)
Basophils Relative: 0 %
Basophils Relative: 0 %
Eosinophils Absolute: 0.1 10*3/uL (ref 0.0–0.5)
Eosinophils Absolute: 0 10*3/uL (ref 0.0–0.5)
Eosinophils Relative: 1 %
Eosinophils Relative: 0 %
HCT: 26.5 % — ABNORMAL LOW (ref 39.0–52.0)
HCT: 25 % — ABNORMAL LOW (ref 39.0–52.0)
Hemoglobin: 8.4 g/dL — ABNORMAL LOW (ref 13.0–17.0)
Hemoglobin: 8.1 g/dL — ABNORMAL LOW (ref 13.0–17.0)
Immature Granulocytes: 6 %
Lymphocytes Relative: 3 %
Lymphocytes Relative: 4 %
Lymphs Abs: 0.4 10*3/uL — ABNORMAL LOW (ref 0.7–4.0)
Lymphs Abs: 0.5 10*3/uL — ABNORMAL LOW (ref 0.7–4.0)
MCH: 25.4 pg — ABNORMAL LOW (ref 26.0–34.0)
MCH: 25.7 pg — ABNORMAL LOW (ref 26.0–34.0)
MCHC: 31.7 g/dL (ref 30.0–36.0)
MCHC: 32.4 g/dL (ref 30.0–36.0)
MCV: 80.1 fL (ref 80.0–100.0)
MCV: 79.4 fL — ABNORMAL LOW (ref 80.0–100.0)
Metamyelocytes Relative: 1 %
Monocytes Absolute: 0.5 10*3/uL (ref 0.1–1.0)
Monocytes Absolute: 0.7 10*3/uL (ref 0.1–1.0)
Monocytes Relative: 4 %
Monocytes Relative: 5 %
Neutro Abs: 12.5 10*3/uL — ABNORMAL HIGH (ref 1.7–7.7)
Neutro Abs: 11.7 10*3/uL — ABNORMAL HIGH (ref 1.7–7.7)
Neutrophils Relative %: 91 %
Neutrophils Relative %: 85 %
Platelets: 162 10*3/uL (ref 150–400)
Platelets: 160 10*3/uL (ref 150–400)
RBC: 3.31 MIL/uL — ABNORMAL LOW (ref 4.22–5.81)
RBC: 3.15 MIL/uL — ABNORMAL LOW (ref 4.22–5.81)
RDW: 22.5 % — ABNORMAL HIGH (ref 11.5–15.5)
RDW: 22.8 % — ABNORMAL HIGH (ref 11.5–15.5)
WBC: 13.7 10*3/uL — ABNORMAL HIGH (ref 4.0–10.5)
WBC: 13.8 10*3/uL — ABNORMAL HIGH (ref 4.0–10.5)
nRBC: 1.8 % — ABNORMAL HIGH (ref 0.0–0.2)
nRBC: 2 /100 WBC — ABNORMAL HIGH
nRBC: 2.7 % — ABNORMAL HIGH (ref 0.0–0.2)

## 2020-11-13 LAB — CULTURE, RESPIRATORY W GRAM STAIN: Culture: NO GROWTH

## 2020-11-13 LAB — RENAL FUNCTION PANEL
Albumin: 1.2 g/dL — ABNORMAL LOW (ref 3.5–5.0)
Anion gap: 7 (ref 5–15)
BUN: 50 mg/dL — ABNORMAL HIGH (ref 6–20)
CO2: 27 mmol/L (ref 22–32)
Calcium: 7.9 mg/dL — ABNORMAL LOW (ref 8.9–10.3)
Chloride: 102 mmol/L (ref 98–111)
Creatinine, Ser: 1.83 mg/dL — ABNORMAL HIGH (ref 0.61–1.24)
GFR, Estimated: 45 mL/min — ABNORMAL LOW (ref >60)
Glucose, Bld: 314 mg/dL — ABNORMAL HIGH (ref 70–99)
Phosphorus: 1.5 mg/dL — ABNORMAL LOW (ref 2.5–4.6)
Potassium: 3.5 mmol/L (ref 3.5–5.1)
Sodium: 136 mmol/L (ref 135–145)

## 2020-11-13 LAB — COMPREHENSIVE METABOLIC PANEL
ALT: 227 U/L — ABNORMAL HIGH (ref 0–44)
AST: 161 U/L — ABNORMAL HIGH (ref 15–41)
Albumin: 1.3 g/dL — ABNORMAL LOW (ref 3.5–5.0)
Alkaline Phosphatase: 94 U/L (ref 38–126)
Anion gap: 13 (ref 5–15)
BUN: 49 mg/dL — ABNORMAL HIGH (ref 6–20)
CO2: 25 mmol/L (ref 22–32)
Calcium: 8.1 mg/dL — ABNORMAL LOW (ref 8.9–10.3)
Chloride: 96 mmol/L — ABNORMAL LOW (ref 98–111)
Creatinine, Ser: 2.08 mg/dL — ABNORMAL HIGH (ref 0.61–1.24)
GFR, Estimated: 38 mL/min — ABNORMAL LOW (ref >60)
Glucose, Bld: 343 mg/dL — ABNORMAL HIGH (ref 70–99)
Potassium: 3.6 mmol/L (ref 3.5–5.1)
Sodium: 134 mmol/L — ABNORMAL LOW (ref 135–145)
Total Bilirubin: 6.3 mg/dL — ABNORMAL HIGH (ref 0.3–1.2)
Total Protein: 6.7 g/dL (ref 6.5–8.1)

## 2020-11-13 LAB — PHOSPHORUS: Phosphorus: 3.3 mg/dL (ref 2.5–4.6)

## 2020-11-13 LAB — MAGNESIUM: Magnesium: 2.7 mg/dL — ABNORMAL HIGH (ref 1.7–2.4)

## 2020-11-13 MED ORDER — ZINC CHLORIDE 1 MG/ML IV SOLN
INTRAVENOUS | Status: AC
  Filled 2020-11-13: qty 2052

## 2020-11-13 NOTE — Plan of Care (Signed)
  Problem: Clinical Measurements: Goal: Diagnostic test results will improve Outcome: Progressing   

## 2020-11-13 NOTE — Progress Notes (Signed)
27 Days Post-Op  Subjective: Slowly weaning levophed, at 6 this morning. Vasopressin remains on. WBC downtrending. Tbili 6 today. Output from biliary drain has been low the last 2 days. Afebrile. BAL and blood cultures remain negative to date.   Objective: Vital signs in last 24 hours: Temp:  [95 F (35 C)-97.9 F (36.6 C)] 95 F (35 C) (05/05 0400) Pulse Rate:  [50-103] 95 (05/05 0600) Resp:  [0-30] 30 (05/05 0600) BP: (83-126)/(48-90) 83/54 (05/05 0600) SpO2:  [99 %-100 %] 100 % (05/05 0600) Arterial Line BP: (109-147)/(46-70) 109/53 (05/05 0600) FiO2 (%):  [70 %-100 %] 70 % (05/05 0423) Last BM Date:  (pta)  Intake/Output from previous day: 05/04 0701 - 05/05 0700 In: 3970.4 [I.V.:3310.4; IV Piggyback:660.1] Out: 1937 [Drains:780] Intake/Output this shift: Total I/O In: 2122.5 [I.V.:1972.7; IV Piggyback:149.8] Out: 2130 [Drains:310; Other:1820]  PE: General: resting comfortably, NAD Neuro: alert, no focal deficits HEENT: trach in place, site is clean Resp: trach in place, on vent Vent Mode: PRVC FiO2 (%):  [70 %-100 %] 70 % Set Rate:  [24 bmp-26 bmp] 24 bmp Vt Set:  [650 mL-688 mL] 688 mL PEEP:  [8 cmH20] 8 cmH20 Plateau Pressure:  [33 cmH20-36 cmH20] 33 cmH20 CV: irregular rhythm, rate in 70s Abdomen: soft, nondistended, vac in place on midline wound. RUQ JP x1 with bilious fluid. Biliary drain with bile. LUQ perc drain to gravity drainage with purulent fluid. Pancreatic stent draining clear colorless fluid. G tube in place to gravity drainage, draining clear nonbloody gastric contents. Extremities: warm and well-perfused  Lab Results:  Recent Labs    11/12/20 0413 11/12/20 0438 11/12/20 0519 11/13/20 0430  WBC 25.1*  --   --  13.7*  HGB 9.0*   < > 10.2* 8.4*  HCT 28.4*   < > 30.0* 26.5*  PLT 248  --   --  162   < > = values in this interval not displayed.   BMET Recent Labs    11/12/20 1522 11/13/20 0430  NA 137 134*  K 4.0 3.6  CL 99 96*  CO2  24 25  GLUCOSE 268* 343*  BUN 58* 49*  CREATININE 2.80* 2.08*  CALCIUM 7.8* 8.1*   PT/INR No results for input(s): LABPROT, INR in the last 72 hours. CMP     Component Value Date/Time   NA 134 (L) 11/13/2020 0430   NA 142 07/21/2020 0921   K 3.6 11/13/2020 0430   CL 96 (L) 11/13/2020 0430   CO2 25 11/13/2020 0430   GLUCOSE 343 (H) 11/13/2020 0430   BUN 49 (H) 11/13/2020 0430   BUN 16 07/21/2020 0921   CREATININE 2.08 (H) 11/13/2020 0430   CREATININE 1.16 03/01/2016 1607   CALCIUM 8.1 (L) 11/13/2020 0430   PROT 6.7 11/13/2020 0430   PROT 6.7 07/21/2020 0921   ALBUMIN 1.3 (L) 11/13/2020 0430   ALBUMIN 4.1 07/21/2020 0921   AST 161 (H) 11/13/2020 0430   ALT 227 (H) 11/13/2020 0430   ALKPHOS 94 11/13/2020 0430   BILITOT 6.3 (H) 11/13/2020 0430   BILITOT 0.8 07/21/2020 0921   GFRNONAA 38 (L) 11/13/2020 0430   GFRNONAA >89 09/22/2015 1008   GFRAA 82 07/21/2020 0921   GFRAA >89 09/22/2015 1008   Lipase     Component Value Date/Time   LIPASE 23 08/22/2020 0318       Assessment/Plan 50 yo male 6 weeks s/p Whipple, right colectomy and SMV repair for T3N0 neuroendocrine tumor. C/b postop hemorrhagic shock with  DIC and subsequent diffuse small bowel necrosis, now s/p small bowel resection with external drainage of bile duct, pancreatic duct and stomach. - Continue broad-spectrum antibiotics and antifungal. Cultures remain negative to date. - Rising bilirubin is probably secondary to peritoneal absorption of bile, as the biliary drain is not adequately draining and there is leakage around it, as evidenced by increased output from the adjacent JP drain. I flushed the biliary drainage catheter this morning and aspirated bile, hopefully this will improve drainage. - CRRT - Continue TPN - Fentanyl for pain control - VTE: SQH - Dispo: ICU. Appreciate assistance from CCM and palliative. Palliative care met with family yesterday but per their notes family does not seem very receptive  to discussions and want to continue aggressive medical care. Patient has survived multiple episodes of sepsis/shock at this point and currently seems to be slowly improving. I think it is reasonable to continue our current interventions and see if he is able to make further progress. I have emphasized with family in prior discussions that although a transplant would be the ideal end goal for Othal, there are many criteria he has to meet to get to that point, that he has to be an outpatient and ambulatory to even be considered for a transplant (per my prior discussions directly with the [facility] transplant team), and that he is currently far from that. I agree with sustaining the DNR order even as he is clinically improving, because if he has a cardiopulmonary arrest, attempts at resuscitation at that point would not change his outcome and would likely lead to prolonged suffering.   LOS: 69 days    Michaelle Birks, MD [facility] Surgery General, Hepatobiliary and Pancreatic Surgery 11/13/20 6:09 AM

## 2020-11-13 NOTE — Progress Notes (Signed)
[name], MRN:  [id number], DOB:  09/22/1970, LOS: 64 ADMISSION DATE:  09/11/2020, CONSULTATION DATE:  11/13/20 REFERRING MD:  Anesthesia, CHIEF COMPLAINT:  Whipple, post-op shock   Brief History:  50 y.o. M with PMH of neuroendocrine tumor and underwent Whipple procedure 3/14 with intra-operative SMV laceration and repair by vascular with bovine pericardial patch.  Pt had worsening shock overnight and required three pressors despite massive transfusion protocol (received 150 blood products).  He was taken back to the OR for washout on 3/15 and returned to the ICU intubated.  Taken back 3/17 again found to have small bowel necrosis s/p resection.  Went back for ex-lap on 3/19, noted to have necrosis of nearly entirety of bowel, no bowel resected.  Fascia closed.  Case was discussed with family and [facility] transplant surgery for consideration of eventual small bowel transplant.  Electively returned to the OR on 3/21 for resection of remaining necrotic small bowel removed, G-tube placed, JP drains in place (2). Tracheostomy ws performed by Dr. Bobbye Morton on 4/8.  Started CRRT for AKI on 4/9.  See details below regarding complex hospitalization.   Past Medical History:  Neuroendocrine tumor of pancreas, resected 09/17/2020, negative surgical margins, lymph nodes negative Hypertension Chronic Systolic heart failure LVEF 35% in 2011, 2022 LVEF 50%, RVSP normal, LA dilated Non-ischemic cardioyopathy Hyperlipidemia V-fib with arrest, AICD placed in [facility] Events:  3/14: Admit to Surgery, to OR for whipple, SMV injury and repair, massive transfusion protocol overnight  3/15: Back to OR for washout, x2. IR for arteriogram. No major bleed source identified in OR, or arterial bleed w IR. Robust product resuscitation >75 products (+ TXA + novoseven) 3/16: off pressors, add'l 39 products overnight. Slowing resuscitation this morning with hemodynamic improvements and slowing drain  output; OR x 2 - washout, biliary t tube, wound vac -- washout, wound vac, IR for arteriogram -- no arterial bleed for embolization 3/17:  Aggressive balanced transfusion continue overnight with marked hemodynamic improvement since yesterday evening. Off pressors x several hours, Wound vac output significantly slowing (from 567ml q15-36min to 554ml q1hr+) and output is much thinner, Thin bloody secretions from mouth approx 242ml, Decreased RR from 22 to 18, Unable to tolerate NE- severe bradycardia 3/19 taken back to OR. Small bowel noted to be almost completely necrosed. Patient was closed with plans for goals of car discussion.  3/20 Anselmo family requests full scope of treatment. Primary team discussed with [facility] the possibility of transfer for small bowel transplant. [facility] felt as though transfer would not change outcome. 3/21 Ongoing discussion with family and [facility]. Patient may be a candidate for transplant if he can stabilize post a small bowel resection. He was taken back to the OR and small bowel was resected.  3/23 weaning pressors. Versed off. Remains encephalopathic 3/24 off pressors. Low dose dilaudid gtt -- only weakly grimaces to pain. Changing sedation to precedex + PRN fentanyl. Long discussion with 2 brothers regarding clinical case -- tried to clarify that while the term "stable" has been used, in this instance is meaning that he has not declined from previous shift but is in fact still critically ill with multisystem organ dysfunction.  3/25 Cr and BUN increased. Off sedation  3/26 Pressors off, CT head benign, BUN/Cr continue to creep up 3/27 Tachycardia/HTN overnight improved with fentanyl gtt 3/28 PCCM sign off. Trauma to take over vent/CCM needs.  3/30 extubated 4/2 desaturated overnight to the 30s improved with BVM and NTS.  4/5 poor airway protection. PCCM consulted for re-intubation.  4/6 CT a/p Large RLL opacity, smaller LLL opacity. Post op changes. 5.4cm fluid collection in  posterior pelvis, concerning for abscess. Started on Zosyn  4/7 got transfused overnight and then diuresed. Worse renal function 4/8 Tracheostomy performed by general surgery 4/9 transfused again. HD line inserted and started on CRRT 4/10 started on trach collar trials. 4/12 off CRRT and transitioned to IHD. Surgery placed abdominal drain with minimal output-- during procedure seemed c/w hematoma however since grew out klebsiella  4/13, 4/14 Tolerated dialysis 4/15 Placed on vent overnight for rest 4/16 Tolerating trach collar 4/17 got HD. Trach downsized to 6 cuffless, trach dislodged overnight, replaced with #6 cuffless shiley.  4/18 afebrile, trach collar + PMV. Up to chair with PT, PCCM signed off 4/21 decannulated; called back for hypotension, Afib/RVR, increased output from his 5 drains and wound vac.Transfused 2 units, re-cannulated, on Levo and Neo.  ICD fired x2, cardiology consulted 4/24 Continues to tolerate trach collar, 5L 28% 4/29 fever overnight, no complaints, remains on tracheostomy collar 4/30-5/1 fever continued, WBC unchanged, blood cultures sent, zosyn restarted 5/3 worsening shock, hypoxemia due to HCAP, back on ventilator, CRRT started again, made DNR, turned off ICD   Consults:  PCCM VVS  IR  Palliative Cardiology Nephrology  Procedures:  PICC 3/25 > JP x 2  Biliary drain Pancreatic stent drain ETT 2/24>>3/13; 3/15 >> 3/29;  4/5>>4/8 Trach 4/8> L lateral abdominal drain 4/12>  Significant Diagnostic Tests:  See radiology tab Most recent significant imaging:  4/12 CT abd/pelvis>>  Unchanged ill-defined fluid collections within the left side of the root of the abdominal mesentery, the ventral aspect of the abdominal mesentery, the subcapsular aspect of the right lobe of the liver and the pelvic cul-de-sac all of which are incompletely evaluated on this noncontrast examination though may represent areas of evolving hematomas giving imaging stability for the  past week. Redemonstrated ill-defined stranding surrounding the remaining portions of the pancreas without definable/drainable fluid collection on this noncontrast examination.  Micro Data:  See micro tab Most recent significant findings:   4/5 trach asp >> Klebsiella pneumoniae 4/12 Pelvic abscess >> ampicillin resistant Klebsiella pneumoniae 4/12 BCx> no growth   5/1 blood cultures 5/3 resp culture >  Antimicrobials:  Zosyn 3/21 > 3/29; 4/5 >4/19; 5/1 >  Eraxis 3/23> 3/29, 4/12>4/19   Interim History / Subjective:   Remains on vasopressors, lower doses On mechanical ventilation, FiO2 70%  Objective   Blood pressure (!) 92/51, pulse 66, temperature (!) 95 F (35 C), temperature source Oral, resp. rate 15, height 6\' 2"  (1.88 m), weight 109.9 kg, SpO2 100 %. CVP:  [17 mmHg-22 mmHg] 18 mmHg  Vent Mode: PRVC FiO2 (%):  [70 %-90 %] 70 % Set Rate:  [24 bmp-26 bmp] 24 bmp Vt Set:  [650 mL-688 mL] 688 mL PEEP:  [8 cmH20] 8 cmH20 Plateau Pressure:  [33 cmH20-36 cmH20] 33 cmH20   Intake/Output Summary (Last 24 hours) at 11/13/2020 0739 Last data filed at 11/13/2020 0700 Gross per 24 hour  Intake 4149.86 ml  Output 4562 ml  Net -412.14 ml   Filed Weights   11/06/20 1530 11/11/20 0906 11/12/20 0500  Weight: 96 kg 109.6 kg 109.9 kg   Physical Exam:  General:  In bed on vent HENT: NCAT ETT in place PULM: CTA B, vent supported breathing CV: RRR, no mgr GI: no bowel sounds, wound vac, GJ, multiple drains in place MSK: normal bulk and tone Neuro:drowsy  but will wake up easily  CXR 5/5 bilateral air space disease R > L, tracheostomy in place  Resolved Hospital Problem list   Hemorrhagic shock hypophosphatemia Septic shock  Klebsiella pneumoniae pneumonia Acute hypoxemic respiratory failure requiring mechanical ventilation  Assessment & Plan:   Acute respiratory failure with hypoxia > worsened due to HCAP S/p Tracheostomy  Full mechanical vent support VAP  prevention Daily WUA/SBT OK to wean FiO2 and vent today  PAF  Systolic heart failure  ICD in place, turned off defib function on 5/3 Tele Amiodarone OK if VT  Septic shock: hemodynamics improved on 5/5 but still in shock Stop hydrocortisone today Wean off levophed Vasopressin to continue  HCAP > still no organism identified Multiple abdominal abscesses Possible fungemia Stop vanc If stable 5/6 stop antifungals Continue cefepime  No small bowel: not a candidate for small bowel transplant TPN per pharmacy and CCS  AKI now on HD Severe metabolic acidosis: most likely due to hypoperfusion from septic shock Hyperkalemia> improved post CRRT Continue CRRT    Daily Goals Checklist  Per primary Code Status: DNR. Global: remains critically ill. Dr. Zenia Resides has spoken to [facility] transplant again and updated them about his current condition.  They state that they would consider transplant evaluation for him if he is ambulatory.  Based on this we are continuing aggressive management to see if we can get him to that place.  Everyone understands the likelihood of survival even to hospital discharge is very low.  I updated the family through [facility] today.  Based on this discussion it is reasonable to continue vasopressors (titrating up if necessary, using amiodarone if necessary), but keep the DNR in place because if he has a cardiac arrest he would not survive to hospital discharge and CPR would only give him unnecessary pain and suffering without medical benefit.      My cc time 40 minutes  Roselie Awkward, MD [place] [place] [facility] Pager: [phone] Cell: [phone] If no response, please call [phone] until 7pm After 7:00 pm call Elink  [phone]   11/13/2020, 7:39 AM

## 2020-11-13 NOTE — Progress Notes (Signed)
PHARMACY - TOTAL PARENTERAL NUTRITION CONSULT NOTE  Indication:  Short bowel syndrome  Patient Measurements: (updated) Height: 6\' 2"  (188 cm) Weight: 97.4 kg (214 lb 11.7 oz) IBW/kg (Calculated) : 82.2 TPN AdjBW (KG): 95.3 Body mass index is 27.57 kg/m.  Assessment:  37 YOM with history of mass adjacent to head of the pancreas in 2021. 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  Developed cholecystitis in Feb 2022 and had percutaneous cholecystectomy tube placement.  Presented this admission for neuroendocrine tumor resection with Whipple procedure, also had right total colectomy, colostomy and cholecystectomy on 2020-10-06. SMV was lacerated during procedure and repaired by bovine pericardial patch. Developed hemorrhagic shock with bile leak post-op and underwent placement of biliary T tube with abdominal washout on 3/15 PM. Antimicrobial therapies discontinued 4/20. Pharmacy consulted to manage TPN.   Glucose / Insulin: no hx DM, A1c 5.2%. CBGs consistently up 249-309 Utilized 25 units sSSI in the past 24 hours and 60 units in TPN Electrolytes: Transitioned back to CRRT on 5/3 - K down to 3.6 on 4K CRRT bags, PHos down to 3.3, Mg 2.7, CO2 up to 25, iCa 1.02 Renal: CRRT started 4/9 > 4/11 > iHD > CRRT on 5/3  Hepatic: LFTs mildly elevated, Tbili up to 6.3, albumin 1.3, prealbumin 14.8 >7.8  TG peaked at 740 (3/26) - last value 153 (5/2)  Intake / Output; MIVF: none to minimal UOP, drains (5 total): 780 mL, net +20.L GI Imaging: 4/6 CT - pelvic fluid collection concerning for abscess 4/12 CT - no new fluid collections 4/28 CT - new fluid+gas collection in RLQ GI Surgeries / Procedures: 3/14 Whipple procedure with R hemicolectomy and end ileostomy, cholecystectomy 3/15 washout, VAC placement 3/17 washout, VAC replacement.  Necrosis of entire small bowel except for the PB limb and proximal 40-50cm of jejunum 3/19 re-opening laparotomy, abd closure.  Necrotic  SB. 3/21 SB resection, take down of choledochojejunal anastomosis and pancreaticojejunal anastomosis, take down of duodenojejunal anastomosis, placement of externalized biliary drain / Stamm gastrostomy tube, IA washout and placement of intraperitoneal drains 3/30 extubation> 4/8 trach 4/12 left lateral abdominal drain placed by IR  Central access: R IJ CVC placed 3/15; changed to PICC placed 10/03/20 TPN start date: 09/26/2020  Nutritional Goals (RD rec on 4/21): 2800-3000 kCal, 185-210g AA, fluid >2L/day -on lipid days (MWF): 3120 kcal with 205g protein, 528g CHO, 50g SMOF lipid -on NON-lipid days (TThSaSun): 2618kcal, 528g CHO, 205g protein   Current Nutrition:  TPN  Plan: - Continue TPN infusion at goal rate of 150 mL/hr  - Continue 19g/L SMOFlipid on MWF due to elevated TG - TPN provides a weekly avg of 2833 kCal and 205g AA per day, meeting 100% of needs - Electrolytes in TPN: Na 144mEq/L, add back 10 meq/L K, continue Ca 5 meQ/L, continue Mg 5 mEq/L, Increase Phos to 15 mmol/L, change to Cl:Ac 1:1  - Daily multivitamin in TPN. Remove standard trace elements and add back zinc 5mg  and selenium 75mcg. Remove chromium while on RRT. - Continue sSSI - Increase to 70 units insulin regular in TPN  - Standard TPN labs on Mon/Thurs. Ordering TG tomorrow. If TG remains stable, will consider increasing weekly frequency of lipids   Albertina Parr, PharmD., BCPS, BCCCP Clinical Pharmacist Please refer to [facility] for unit-specific pharmacist

## 2020-11-13 NOTE — Progress Notes (Signed)
[facility] NEPHROLOGY PROGRESS NOTE  Assessment/ Plan: Pt is a 50 y.o. yo male with neuroendocrine tumor status post Whipple procedure right colectomy and SMV repair, developed AKI on dialysis.  #AKI HD dependent, prolonged since 4/9. ATN.  No evidence of recovery and sig decompensation 5/3 req back on CRRT.  All 4K, Net even UF, no anticoagulation.  Grim prognosis.  Palliative working with with family. Replete P if < 3.  Per CCM/CCS could start gentle net negative 10m/h as desired  #Acute respiratory failure required mechanical ventilation. Back on Vent, per CCM. [place] HCAP  #Neuroendocrine tumor status post Whipple procedure with extensive small bowel necrosis status post resection.    #Severe protein calorie malnutrition, hypoalbuminemia  #Anemia: Hemoglobin stable.  Transfuse PRBC as needed.  Per CCM and CCS  #Hypotension: back in severe septic shock on 3 pressors.    # Hyperkalemia, resolved  # metabolic and resp acidosis, as above.    Subjective:   Pressor requirement decreased  Stable labs, K 3.6 P 3.3  Still high FIO2  CC[place]CCS [place] consulted  Now all 4K bath, Net even UF, CRRT running well  Objective Vital signs in last 24 hours: Vitals:   11/13/20 0945 11/13/20 1000 11/13/20 1015 11/13/20 1024  BP: 97/66 105/68 95/60   Pulse: 100 92 66   Resp: (!) 27 10 18  (!) 24  Temp:      TempSrc:      SpO2: 100% 100% 100% 100%  Weight:      Height:       Weight change:   Intake/Output Summary (Last 24 hours) at 11/13/2020 1101 Last data filed at 11/13/2020 1000 Gross per 24 hour  Intake 3968.06 ml  Output 4136 ml  Net -167.94 ml       Labs: Basic Metabolic Panel: Recent Labs  Lab 11/12/20 0413 11/12/20 0438 11/12/20 0519 11/12/20 1522 11/13/20 0430  NA 136   < > 138 137 134*  K 4.2   < > 4.1 4.0 3.6  CL 96*  --   --  99 96*  CO2 18*  --   --  24 25  GLUCOSE 399*  --   --  268* 343*  BUN 66*  --   --   58* 49*  CREATININE 3.77*  --   --  2.80* 2.08*  CALCIUM 7.6*  --   --  7.8* 8.1*  PHOS 6.0*  --   --  3.9 3.3   < > = values in this interval not displayed.   Liver Function Tests: Recent Labs  Lab 11/10/20 0501 11/11/20 0739 11/11/20 1448 11/12/20 0413 11/12/20 1522 11/13/20 0430  AST 83*  --  178*  --   --  161*  ALT 142*  --  156*  --   --  227*  ALKPHOS 66  --  90  --   --  94  BILITOT 3.7*  --  5.7*  --   --  6.3*  PROT 6.7  --  8.2*  --   --  6.7  ALBUMIN 1.4*  1.3*   < > 1.5*  1.5* 1.3* 1.3* 1.3*   < > = values in this interval not displayed.   No results for input(s): LIPASE, AMYLASE in the last 168 hours. No results for input(s): AMMONIA in the last 168 hours. CBC: Recent Labs  Lab 11/10/20 0501 11/11/20 0300 11/11/20 0811505/03/22 1448 11/11/20 2130 11/12/20 0413 11/12/20 [id number]/04/22 00355  11/13/20 0430 11/13/20 0844  WBC 8.6 24.1*  --  36.7*  --  25.1*  --   --  13.7* 13.8*  NEUTROABS 6.3  --   --   --   --   --   --   --  12.5* PENDING  HGB 8.5* 8.9*   < > 9.3*   < > 9.0*   < > 10.2* 8.4* 8.1*  HCT 25.9* 28.3*   < > 29.1*   < > 28.4*   < > 30.0* 26.5* 25.0*  MCV 80.2 82.3  --  80.8  --  82.3  --   --  80.1 79.4*  PLT 192 309  --  385  --  248  --   --  162 160   < > = values in this interval not displayed.   Cardiac Enzymes: No results for input(s): CKTOTAL, CKMB, CKMBINDEX, TROPONINI in the last 168 hours. CBG: Recent Labs  Lab 11/12/20 1552 11/12/20 2021 11/12/20 2345 11/13/20 0341 11/13/20 0814  GLUCAP 239* 280* 302* 309* 299*    Iron Studies: No results for input(s): IRON, TIBC, TRANSFERRIN, FERRITIN in the last 72 hours. Studies/Results: DG Chest Port 1 View  Result Date: 11/13/2020 CLINICAL DATA:  Respiratory failure. EXAM: PORTABLE CHEST 1 VIEW COMPARISON:  11/12/2020. FINDINGS: Tracheostomy tube, right IJ line, right PICC line in stable position. Cardiac pacer stable position. Stable cardiomegaly. Diffuse bilateral pulmonary  infiltrates/edema again noted. No interim change. No pleural effusion. No pneumothorax. IMPRESSION: 1.  Lines and tubes in stable position. 2.  Cardiac pacer in stable position.  Cardiomegaly again noted. 3. Diffuse bilateral pulmonary infiltrates/edema again noted. No interim change. Electronically Signed   By: Marcello Moores  Register   On: 11/13/2020 06:00   DG CHEST PORT 1 VIEW  Result Date: 11/12/2020 CLINICAL DATA:  Tracheostomy tube placement EXAM: PORTABLE CHEST 1 VIEW COMPARISON:  Portable exam 1431 hours compared to 0548 hours FINDINGS: Tracheostomy tube projects over tracheal air column. RIGHT jugular line and RIGHT arm PICC line with tips projecting over SVC. LEFT subclavian ICD with lead projecting over RIGHT ventricle. Enlargement of cardiac silhouette. BILATERAL pulmonary infiltrates RIGHT greater than LEFT consistent with multifocal pneumonia unchanged. No pleural effusion or pneumothorax. IMPRESSION: Persistent BILATERAL pulmonary infiltrates consistent with multifocal pneumonia. Electronically Signed   By: Lavonia Dana M.D.   On: 11/12/2020 16:13   DG Chest Port 1 View  Result Date: 11/12/2020 CLINICAL DATA:  Respiratory failure. EXAM: PORTABLE CHEST 1 VIEW COMPARISON:  11/11/2020. FINDINGS: Tracheostomy tube, right IJ line, right PICC line in stable position. Cardiac pacer stable position. Stable cardiomegaly. Diffuse bilateral pulmonary infiltrates/edema again noted without interim change. No prominent pleural effusion. No pneumothorax. IMPRESSION: 1.  Lines and tubes in stable position. 2.  Cardiac pacer stable position.  Stable cardiomegaly. 3. Diffuse bilateral pulmonary infiltrates/edema again noted without interim change. Electronically Signed   By: Marcello Moores  Register   On: 11/12/2020 05:57   DG CHEST PORT 1 VIEW  Result Date: 11/11/2020 CLINICAL DATA:  Tracheostomy EXAM: PORTABLE CHEST 1 VIEW COMPARISON:  11/11/2020 FINDINGS: Single frontal view of the chest demonstrates a tracheostomy tube  overlying tracheal air column tip just below thoracic inlet. Right internal jugular catheter and right-sided PICC line both overlie the superior vena cava. Single lead AICD unchanged. The cardiac silhouette is stable. Persistent multifocal bilateral airspace disease, right greater than left. No significant change since prior study. No effusion or pneumothorax. No acute bony abnormalities. IMPRESSION: 1. Support devices as above. 2.  Multifocal bilateral airspace disease asymmetric on the right, grossly stable since prior study. Electronically Signed   By: Randa Ngo M.D.   On: 11/11/2020 21:52    Medications: Infusions: .  prismasol BGK 4/2.5 1,000 mL/hr at 11/13/20 0849  .  prismasol BGK 4/2.5 500 mL/hr at 11/13/20 0308  . sodium chloride 10 mL/hr at 11/13/20 0700  . sodium chloride Stopped (11/08/20 1119)  . sodium chloride    . sodium chloride 0 mL (11/10/20 0701)  . sodium chloride    . amiodarone Stopped (11/11/20 0246)  . anidulafungin 100 mg (11/13/20 0922)  . fentaNYL infusion INTRAVENOUS 50 mcg/hr (11/13/20 1038)  . norepinephrine (LEVOPHED) Adult infusion 5 mcg/min (11/13/20 1005)  . piperacillin-tazobactam 3.375 g (11/13/20 0816)  . prismasol BGK 4/2.5 1,500 mL/hr at 11/13/20 1026  . promethazine (PHENERGAN) injection (IM or IVPB) Stopped (11/10/20 1538)  . TPN ADULT (ION) 150 mL/hr at 11/13/20 0700  . TPN ADULT (ION)    . vasopressin 0.03 Units/min (11/13/20 0700)    Scheduled Medications: . chlorhexidine gluconate (MEDLINE KIT)  15 mL Mouth Rinse BID  . Chlorhexidine Gluconate Cloth  6 each Topical Q0600  . heparin injection (subcutaneous)  5,000 Units Subcutaneous Q8H  . insulin aspart  0-9 Units Subcutaneous Q4H  . lidocaine  5 mL Intradermal Once  . mouth rinse  15 mL Mouth Rinse 10 times per day  . pantoprazole (PROTONIX) IV  40 mg Intravenous BID  . sodium chloride flush  10-40 mL Intracatheter Q12H  . sodium chloride flush  5 mL Intracatheter Q8H    have  reviewed scheduled and prn medications.  Physical Exam: General: Lying in bed, on vent Heart:RRR, s1s2 nl Lungs: Clear anteriorly Abdomen:soft, non-distended Extremities: Trace LE edema Dialysis Access: IJ HD temp catheter in place.  [place] 11/13/2020,11:01 AM  LOS: 52 days

## 2020-11-13 NOTE — Progress Notes (Signed)
OT Cancellation Note  Patient Details Name: [name] MRN: [id number] DOB: [birth year]   Cancelled Treatment:    Reason Eval/Treat Not Completed: Medical issues which prohibited therapy;Patient not medically ready. On CRRT and on two pressors. RN request hold. Will return as schedule allows.   [place], OTR/L Acute Rehab Pager: [phone] Office: [phone] 11/13/2020, 8:19 AM

## 2020-11-13 NOTE — Progress Notes (Signed)
PT Cancellation Note  Patient Details Name: [name] MRN: [id number] DOB: [birth year]   Cancelled Treatment:    Reason Eval/Treat Not Completed: Medical issues which prohibited therapy;Patient not medically ready. RN requesting hold on PT services this date as pt is not medically ready. Will follow-up as appropriate another day.   [name], PT, DPT Acute Rehabilitation Services  Pager: [phone] Office: [place] 11/13/2020, 7:49 AM

## 2020-11-14 ENCOUNTER — Inpatient Hospital Stay (HOSPITAL_COMMUNITY): Payer: Managed Care, Other (non HMO)

## 2020-11-14 DIAGNOSIS — J189 Pneumonia, unspecified organism: Secondary | ICD-10-CM

## 2020-11-14 DIAGNOSIS — N179 Acute kidney failure, unspecified: Secondary | ICD-10-CM | POA: Diagnosis not present

## 2020-11-14 DIAGNOSIS — R6521 Severe sepsis with septic shock: Secondary | ICD-10-CM

## 2020-11-14 DIAGNOSIS — D3A8 Other benign neuroendocrine tumors: Secondary | ICD-10-CM | POA: Diagnosis not present

## 2020-11-14 DIAGNOSIS — J9601 Acute respiratory failure with hypoxia: Secondary | ICD-10-CM | POA: Diagnosis not present

## 2020-11-14 DIAGNOSIS — Z9049 Acquired absence of other specified parts of digestive tract: Secondary | ICD-10-CM

## 2020-11-14 DIAGNOSIS — C251 Malignant neoplasm of body of pancreas: Secondary | ICD-10-CM

## 2020-11-14 LAB — CULTURE, BLOOD (ROUTINE X 2)
Culture: NO GROWTH
Culture: NO GROWTH

## 2020-11-14 LAB — RENAL FUNCTION PANEL
Albumin: 1.3 g/dL — ABNORMAL LOW (ref 3.5–5.0)
Anion gap: 7 (ref 5–15)
BUN: 45 mg/dL — ABNORMAL HIGH (ref 6–20)
CO2: 27 mmol/L (ref 22–32)
Calcium: 8 mg/dL — ABNORMAL LOW (ref 8.9–10.3)
Chloride: 101 mmol/L (ref 98–111)
Creatinine, Ser: 1.56 mg/dL — ABNORMAL HIGH (ref 0.61–1.24)
GFR, Estimated: 54 mL/min — ABNORMAL LOW (ref >60)
Glucose, Bld: 200 mg/dL — ABNORMAL HIGH (ref 70–99)
Phosphorus: 3.8 mg/dL (ref 2.5–4.6)
Potassium: 4.2 mmol/L (ref 3.5–5.1)
Sodium: 135 mmol/L (ref 135–145)

## 2020-11-14 LAB — CBC
HCT: 25.1 % — ABNORMAL LOW (ref 39.0–52.0)
Hemoglobin: 8.3 g/dL — ABNORMAL LOW (ref 13.0–17.0)
MCH: 25.9 pg — ABNORMAL LOW (ref 26.0–34.0)
MCHC: 33.1 g/dL (ref 30.0–36.0)
MCV: 78.4 fL — ABNORMAL LOW (ref 80.0–100.0)
Platelets: 172 10*3/uL (ref 150–400)
RBC: 3.2 MIL/uL — ABNORMAL LOW (ref 4.22–5.81)
RDW: 22.6 % — ABNORMAL HIGH (ref 11.5–15.5)
WBC: 17.8 10*3/uL — ABNORMAL HIGH (ref 4.0–10.5)
nRBC: 4.2 % — ABNORMAL HIGH (ref 0.0–0.2)

## 2020-11-14 LAB — TRIGLYCERIDES: Triglycerides: 193 mg/dL — ABNORMAL HIGH (ref <150)

## 2020-11-14 LAB — COMPREHENSIVE METABOLIC PANEL
ALT: 189 U/L — ABNORMAL HIGH (ref 0–44)
AST: 105 U/L — ABNORMAL HIGH (ref 15–41)
Albumin: 1.2 g/dL — ABNORMAL LOW (ref 3.5–5.0)
Alkaline Phosphatase: 101 U/L (ref 38–126)
Anion gap: 7 (ref 5–15)
BUN: 46 mg/dL — ABNORMAL HIGH (ref 6–20)
CO2: 27 mmol/L (ref 22–32)
Calcium: 8.1 mg/dL — ABNORMAL LOW (ref 8.9–10.3)
Chloride: 102 mmol/L (ref 98–111)
Creatinine, Ser: 1.63 mg/dL — ABNORMAL HIGH (ref 0.61–1.24)
GFR, Estimated: 51 mL/min — ABNORMAL LOW (ref >60)
Glucose, Bld: 213 mg/dL — ABNORMAL HIGH (ref 70–99)
Potassium: 3.7 mmol/L (ref 3.5–5.1)
Sodium: 136 mmol/L (ref 135–145)
Total Bilirubin: 6.5 mg/dL — ABNORMAL HIGH (ref 0.3–1.2)
Total Protein: 6.4 g/dL — ABNORMAL LOW (ref 6.5–8.1)

## 2020-11-14 LAB — MAGNESIUM: Magnesium: 2.5 mg/dL — ABNORMAL HIGH (ref 1.7–2.4)

## 2020-11-14 LAB — PHOSPHORUS: Phosphorus: 1.8 mg/dL — ABNORMAL LOW (ref 2.5–4.6)

## 2020-11-14 LAB — GLUCOSE, CAPILLARY
Glucose-Capillary: 214 mg/dL — ABNORMAL HIGH (ref 70–99)
Glucose-Capillary: 166 mg/dL — ABNORMAL HIGH (ref 70–99)
Glucose-Capillary: 193 mg/dL — ABNORMAL HIGH (ref 70–99)
Glucose-Capillary: 192 mg/dL — ABNORMAL HIGH (ref 70–99)
Glucose-Capillary: 157 mg/dL — ABNORMAL HIGH (ref 70–99)
Glucose-Capillary: 155 mg/dL — ABNORMAL HIGH (ref 70–99)

## 2020-11-14 MED ORDER — INSULIN ASPART 100 UNIT/ML IJ SOLN
0.0000 [IU] | INTRAMUSCULAR | Status: DC
  Administered 2020-11-14 (×4): 3 [IU] via SUBCUTANEOUS
  Administered 2020-11-15 (×3): 2 [IU] via SUBCUTANEOUS
  Administered 2020-11-15 (×2): 3 [IU] via SUBCUTANEOUS
  Administered 2020-11-16: 2 [IU] via SUBCUTANEOUS
  Administered 2020-11-16: 3 [IU] via SUBCUTANEOUS
  Administered 2020-11-16 – 2020-11-22 (×8): 2 [IU] via SUBCUTANEOUS

## 2020-11-14 MED ORDER — SODIUM PHOSPHATES 45 MMOLE/15ML IV SOLN
10.0000 mmol | Freq: Once | INTRAVENOUS | Status: DC
  Filled 2020-11-14 (×3): qty 3.33

## 2020-11-14 MED ORDER — POTASSIUM PHOSPHATES 15 MMOLE/5ML IV SOLN
30.0000 mmol | Freq: Once | INTRAVENOUS | Status: AC
  Administered 2020-11-14: 30 mmol via INTRAVENOUS
  Filled 2020-11-14: qty 10

## 2020-11-14 MED ORDER — ZINC CHLORIDE 1 MG/ML IV SOLN
INTRAVENOUS | Status: AC
  Filled 2020-11-14: qty 2052

## 2020-11-14 NOTE — Progress Notes (Addendum)
eLink Physician-Brief Progress Note Patient Name: [name] DOB: [birth year] MRN: [id number]   Date of Service  11/14/2020  HPI/Events of Note  Elevated HR - Rate = 121 on bedside monitor. Read as sinus tachycardia with multifocal PVC's. BP = 113/57 with MAP = 70 by A-line. Currently on a Norepinephrine IV infusion at 8 mcg/min.  eICU Interventions  Plan: 1. 12 Lead EKG STAT. 2. BMP and Mg++ level STAT.     Intervention Category Major Interventions: Arrhythmia - evaluation and management  Edyth Glomb [place] 11/14/2020, 11:30 PM

## 2020-11-14 NOTE — Progress Notes (Signed)
PT Cancellation Note  Patient Details Name: [name] MRN: [id number] DOB: [birth year]   Cancelled Treatment:    Reason Eval/Treat Not Completed: Medical issues which prohibited therapy;Patient not medically ready at this time. He remains intubated and on CRRT, RN asked therapies to continue to hold at this time, will continue to follow and progress as tolerated.   Karma Ganja, PT, DPT   Acute Rehabilitation Department Pager #: [phone]   Otho Bellows 11/14/2020, 2:58 PM

## 2020-11-14 NOTE — Progress Notes (Signed)
Pharmacy Antibiotic Note  [name] is a 50 y.o. male with fever and possible sepsis. Pharmacy has been consulted for zosyn dosing. Also on anidulafungin per MD. He is noted with AKI, transitioned back to CRRT on 5/3.  Plan: Continue Zosyn 3.375gm IV q6h for CRRT dosing Anidulafungin 100mg  IV q24h per MD - d/c tomorrow if stable per CCM Monitor clinical progress, c/s, abx plan/LOT F/u Renal plans    Height: 6\' 2"  (188 cm) Weight: 106.4 kg (234 lb 9.1 oz) IBW/kg (Calculated) : 82.2  Temp (24hrs), Avg:97 F (36.1 C), Min:96 F (35.6 C), Max:98.1 F (36.7 C)  Recent Labs  Lab 11/11/20 1448 11/11/20 2359 11/12/20 0413 11/12/20 1522 11/13/20 0430 11/13/20 0844 11/13/20 1618 11/14/20 0508  WBC 36.7*  --  25.1*  --  13.7* 13.8*  --  17.8*  CREATININE 5.63*   < > 3.77* 2.80* 2.08*  --  1.83* 1.63*   < > = values in this interval not displayed.    Estimated Creatinine Clearance: 71.3 mL/min (A) (by C-G formula based on SCr of 1.63 mg/dL (H)).    Allergies  Allergen Reactions  . Ace Inhibitors Cough     Arturo Morton, PharmD, BCPS Please check AMION for all [place] contact numbers Clinical Pharmacist 11/14/2020 1:50 PM

## 2020-11-14 NOTE — Progress Notes (Signed)
Billy Housekeeper, NP CCM spoke to the family and they wish to have the patient's ICD turned back on.  [employer] was called [phone] and talked to Will.  He will page the representative on call to arrange having the device turned on.

## 2020-11-14 NOTE — Progress Notes (Signed)
PCCM INTERVAL PROGRESS NOTE  I have had a discussion with the patient's brother. They are requesting the ICD be turned back on now that the patient's condition has somewhat improved. He had the understanding we were only turning if off for 48 hours, and then to turn it back on. I assured him if the patients condition were to decline to the point of requiring defibrillation, any hope of recovery to transplantation would almost certainly be gone. He and the family have spoken and feel very strongly the ICD needs to be turned back on.    Georgann Housekeeper, AGACNP-BC [facility]  See Amion for personal pager PCCM on call pager [phone] until 7pm. Please call Elink 7p-7a. [phone]  11/14/2020 5:19 PM

## 2020-11-14 NOTE — Progress Notes (Signed)
NAMEWYLAN Solomon, MRN:  [id number], DOB:  27-Mar-1971, LOS: 66 ADMISSION DATE:  09/15/2020, CONSULTATION DATE:  11/14/20 REFERRING MD:  Anesthesia, CHIEF COMPLAINT:  Whipple, post-op shock   Brief History:  50 y.o. M with PMH of neuroendocrine tumor and underwent Whipple procedure 3/14 with intra-operative SMV laceration and repair by vascular with bovine pericardial patch.  Pt had worsening shock overnight and required three pressors despite massive transfusion protocol (received 150 blood products).  He was taken back to the OR for washout on 3/15 and returned to the ICU intubated.  Taken back 3/17 again found to have small bowel necrosis s/p resection.  Went back for ex-lap on 3/19, noted to have necrosis of nearly entirety of bowel, no bowel resected.  Fascia closed.  Case was discussed with family and [facility] transplant surgery for consideration of eventual small bowel transplant.  Electively returned to the OR on 3/21 for resection of remaining necrotic small bowel removed, G-tube placed, JP drains in place (2). Tracheostomy ws performed by Dr. Bobbye Morton on 4/8.  Started CRRT for AKI on 4/9.  See details below regarding complex hospitalization.   Past Medical History:  Neuroendocrine tumor of pancreas, resected 09/15/2020, negative surgical margins, lymph nodes negative Hypertension Chronic Systolic heart failure LVEF 35% in 2011, 2022 LVEF 50%, RVSP normal, LA dilated Non-ischemic cardioyopathy Hyperlipidemia V-fib with arrest, AICD placed in [place] Hospital Events:  3/14: Admit to Surgery, to OR for whipple, SMV injury and repair, massive transfusion protocol overnight  3/15: Back to OR for washout, x2. IR for arteriogram. No major bleed source identified in OR, or arterial bleed w IR. Robust product resuscitation >75 products (+ TXA + novoseven) 3/16: off pressors, add'l 39 products overnight. Slowing resuscitation this morning with hemodynamic improvements and slowing drain  output; OR x 2 - washout, biliary t tube, wound vac -- washout, wound vac, IR for arteriogram -- no arterial bleed for embolization 3/17:  Aggressive balanced transfusion continue overnight with marked hemodynamic improvement since yesterday evening. Off pressors x several hours, Wound vac output significantly slowing (from 573ml q15-64min to 549ml q1hr+) and output is much thinner, Thin bloody secretions from mouth approx 217ml, Decreased RR from 22 to 18, Unable to tolerate NE- severe bradycardia 3/19 taken back to OR. Small bowel noted to be almost completely necrosed. Patient was closed with plans for goals of car discussion.  3/20 [place] family requests full scope of treatment. Primary team discussed with [facility] the possibility of transfer for small bowel transplant. [facility] felt as though transfer would not change outcome. 3/21 Ongoing discussion with family and [facility]. Patient may be a candidate for transplant if he can stabilize post a small bowel resection. He was taken back to the OR and small bowel was resected.  3/23 weaning pressors. Versed off. Remains encephalopathic 3/24 off pressors. Low dose dilaudid gtt -- only weakly grimaces to pain. Changing sedation to precedex + PRN fentanyl. Long discussion with 2 brothers regarding clinical case -- tried to clarify that while the term "stable" has been used, in this instance is meaning that he has not declined from previous shift but is in fact still critically ill with multisystem organ dysfunction.  3/25 Cr and BUN increased. Off sedation  3/26 Pressors off, CT head benign, BUN/Cr continue to creep up 3/27 Tachycardia/HTN overnight improved with fentanyl gtt 3/28 PCCM sign off. Trauma to take over vent/CCM needs.  3/30 extubated 4/2 desaturated overnight to the 30s improved with BVM and NTS.  4/5 poor airway protection. PCCM consulted for re-intubation.  4/6 CT a/p Large RLL opacity, smaller LLL opacity. Post op changes. 5.4cm fluid collection in  posterior pelvis, concerning for abscess. Started on Zosyn  4/7 got transfused overnight and then diuresed. Worse renal function 4/8 Tracheostomy performed by general surgery 4/9 transfused again. HD line inserted and started on CRRT 4/10 started on trach collar trials. 4/12 off CRRT and transitioned to IHD. Surgery placed abdominal drain with minimal output-- during procedure seemed c/w hematoma however since grew out klebsiella  4/13, 4/14 Tolerated dialysis 4/15 Placed on vent overnight for rest 4/16 Tolerating trach collar 4/17 got HD. Trach downsized to 6 cuffless, trach dislodged overnight, replaced with #6 cuffless shiley.  4/18 afebrile, trach collar + PMV. Up to chair with PT, PCCM signed off 4/21 decannulated; called back for hypotension, Afib/RVR, increased output from his 5 drains and wound vac.Transfused 2 units, re-cannulated, on Levo and Neo.  ICD fired x2, cardiology consulted 4/24 Continues to tolerate trach collar, 5L 28% 4/29 fever overnight, no complaints, remains on tracheostomy collar 4/30-5/1 fever continued, WBC unchanged, blood cultures sent, zosyn restarted 5/3 worsening shock, hypoxemia due to HCAP, back on ventilator, CRRT started again, made DNR, turned off ICD   Consults:  PCCM VVS  IR  Palliative Cardiology Nephrology  Procedures:  PICC 3/25 > JP x 2  Biliary drain Pancreatic stent drain ETT 2/24>>3/13; 3/15 >> 3/29;  4/5>>4/8 Trach 4/8> L lateral abdominal drain 4/12>  Significant Diagnostic Tests:  See radiology tab Most recent significant imaging:  4/12 CT abd/pelvis>>  Unchanged ill-defined fluid collections within the left side of the root of the abdominal mesentery, the ventral aspect of the abdominal mesentery, the subcapsular aspect of the right lobe of the liver and the pelvic cul-de-sac all of which are incompletely evaluated on this noncontrast examination though may represent areas of evolving hematomas giving imaging stability for the  past week. Redemonstrated ill-defined stranding surrounding the remaining portions of the pancreas without definable/drainable fluid collection on this noncontrast examination.  Micro Data:  See micro tab Most recent significant findings:   4/5 trach asp >> Klebsiella pneumoniae 4/12 Pelvic abscess >> ampicillin resistant Klebsiella pneumoniae 4/12 BCx> no growth   5/1 blood cultures 5/3 resp culture > negative  Antimicrobials:  Zosyn 3/21 > 3/29; 4/5 >4/19; 5/1 >  Eraxis 3/23> 3/29, 4/12>4/19 5/3 > Vanco 5/3 > 5/5   Interim History / Subjective:   Pressors weaning. Levophed 4 and vaso shock dose. Down to 40% 8 PEEP on vent.   Objective   Blood pressure 99/64, pulse 70, temperature 97.6 F (36.4 C), temperature source Axillary, resp. rate (!) 24, height 6\' 2"  (1.88 m), weight 106.4 kg, SpO2 98 %. CVP:  [14 mmHg-18 mmHg] 15 mmHg  Vent Mode: PRVC FiO2 (%):  [40 %-55 %] 40 % Set Rate:  [24 bmp] 24 bmp Vt Set:  [650 mL] 650 mL PEEP:  [8 cmH20] 8 cmH20 Plateau Pressure:  [30 cmH20-35 cmH20] 30 cmH20   Intake/Output Summary (Last 24 hours) at 11/14/2020 0809 Last data filed at 11/14/2020 0700 Gross per 24 hour  Intake 4495.93 ml  Output 5653 ml  Net -1157.07 ml   Filed Weights   11/11/20 0906 11/12/20 0500 11/14/20 0500  Weight: 109.6 kg 109.9 kg 106.4 kg   Physical Exam: General:  Middle aged male on vent via trach Neuro:  Alert, oriented, communicates as expected HEENT:  [place]/AT, No JVD noted, PERRL Cardiovascular:  RRR, no MRG Lungs:  Clear bilateral breath sounds Abdomen:  Soft, non-distended. GJ, multiple drains Musculoskeletal:  No acute deformity Skin:  Intact, MMM   Resolved Hospital Problem list   Hemorrhagic shock Septic shock  Klebsiella pneumoniae pneumonia Acute hypoxemic respiratory failure requiring mechanical ventilation  Assessment & Plan:   Acute respiratory failure with hypoxia > worsened due to HCAP S/p Tracheostomy  Full mechanical vent  support Will try to wean today VAP prevention SpO2 goal > 50%  PAF  Systolic heart failure  ICD in place, turned off defib function on 5/3 Tele Amiodarone OK if VT  Septic shock: hemodynamics improved on 5/5 but still in shock Off stress steroids as of 5/5 Wean off levophed as able for MAP 65 Vasopressin to continue  HCAP > still no organism identified Multiple abdominal abscesses Possible fungemia Continue eraxis one additional day. Hoping WBC improves after stopping steroids.  Continue zosyn day 6  No small bowel: not a candidate for small bowel transplant TPN per pharmacy and CCS  AKI now on HD Severe metabolic acidosis: most likely due to hypoperfusion from septic shock Hyperkalemia> improved post CRRT Hypophosphatemia Continue CRRT Replace phos     Daily Goals Checklist  Per primary Code Status: DNR. Global: remains critically ill. Dr. Zenia Resides has spoken to [facility] transplant again and updated them about his current condition.  They state that they would consider transplant evaluation for him if he is ambulatory.  Based on this we are continuing aggressive management to see if we can get him to that place.  Everyone understands the likelihood of survival even to hospital discharge is very low.  Dr. Lake Bells updated the family through [facility] 5/5.  Based on that discussion it is reasonable to continue vasopressors (titrating up if necessary, using amiodarone if necessary), but keep the DNR in place because if he has a cardiac arrest he would not survive to hospital discharge and CPR would only give him unnecessary pain and suffering without medical benefit.      Critical care time 35 minutes  Georgann Housekeeper, AGACNP-BC [facility]  See Amion for personal pager PCCM on call pager [phone] until 7pm. Please call Elink 7p-7a. [phone]  11/14/2020 8:41 AM

## 2020-11-14 NOTE — Progress Notes (Signed)
[facility] NEPHROLOGY PROGRESS NOTE  Assessment/ Plan: Pt is a 50 y.o. yo male with neuroendocrine tumor status post Whipple procedure right colectomy and SMV repair, developed AKI on dialysis.  #AKI HD dependent, prolonged since 4/9. ATN.  No evidence of recovery and sig decompensation 5/3 req back on CRRT.  All 4K, slight neg UF, no anticoagulation.  Grim prognosis.  Palliative working with with family. Replete P if < 3.    #Acute respiratory failure required mechanical ventilation. Back on Vent, per CCM. [place] HCAP; slowly improving  #Neuroendocrine tumor status post Whipple procedure with extensive small bowel necrosis status post resection.  TPN dependent  #Severe protein calorie malnutrition, hypoalbuminemia; TPN dependent  #Anemia: Hemoglobin stable.  Transfuse PRBC as needed.  Per CCM and CCS  #Hypotension: shock improving   # Hyperkalemia, resolved  # metabolic and resp acidosis, as above.    Subjective:   Pressor requirement further decreased  Stable labs, K 3.7, P 1.8; repleted already  FIO2 coming down  Now all 4K bath, 73m/h neg neg UF, CRRT running well  Objective Vital signs in last 24 hours: Vitals:   11/14/20 1000 11/14/20 1020 11/14/20 1100 11/14/20 1200  BP: (!) 96/49  (!) 96/51 (!) 94/55  Pulse: 77  79 76  Resp: (!) 27 (!) 29 (!) 30 (!) 28  Temp:   98.1 F (36.7 C)   TempSrc:   Oral   SpO2: 97% 97% 96% 97%  Weight:      Height:       Weight change:   Intake/Output Summary (Last 24 hours) at 11/14/2020 1225 Last data filed at 11/14/2020 1200 Gross per 24 hour  Intake 4722.49 ml  Output 6101 ml  Net -1378.51 ml       Labs: Basic Metabolic Panel: Recent Labs  Lab 11/13/20 0430 11/13/20 1618 11/14/20 0508  NA 134* 136 136  K 3.6 3.5 3.7  CL 96* 102 102  CO2 25 27 27   GLUCOSE 343* 314* 213*  BUN 49* 50* 46*  CREATININE 2.08* 1.83* 1.63*  CALCIUM 8.1* 7.9* 8.1*  PHOS 3.3 1.5* 1.8*   Liver Function Tests: Recent  Labs  Lab 11/11/20 1448 11/12/20 0413 11/13/20 0430 11/13/20 1618 11/14/20 0508  AST 178*  --  161*  --  105*  ALT 156*  --  227*  --  189*  ALKPHOS 90  --  94  --  101  BILITOT 5.7*  --  6.3*  --  6.5*  PROT 8.2*  --  6.7  --  6.4*  ALBUMIN 1.5*  1.5*   < > 1.3* 1.2* 1.2*   < > = values in this interval not displayed.   No results for input(s): LIPASE, AMYLASE in the last 168 hours. No results for input(s): AMMONIA in the last 168 hours. CBC: Recent Labs  Lab 11/10/20 0501 11/11/20 0300 11/11/20 1448 11/11/20 2130 11/12/20 0413 11/12/20 0438 11/13/20 0430 11/13/20 0844 11/14/20 0508  WBC 8.6   < > 36.7*  --  25.1*  --  13.7* 13.8* 17.8*  NEUTROABS 6.3  --   --   --   --   --  12.5* 11.7*  --   HGB 8.5*   < > 9.3*   < > 9.0*   < > 8.4* 8.1* 8.3*  HCT 25.9*   < > 29.1*   < > 28.4*   < > 26.5* 25.0* 25.1*  MCV 80.2   < > 80.8  --  82.3  --  80.1 79.4* 78.4*  PLT 192   < > 385  --  248  --  162 160 172   < > = values in this interval not displayed.   Cardiac Enzymes: No results for input(s): CKTOTAL, CKMB, CKMBINDEX, TROPONINI in the last 168 hours. CBG: Recent Labs  Lab 11/13/20 1935 11/13/20 2330 11/14/20 0405 11/14/20 0749 11/14/20 1143  GLUCAP 282* 264* 214* 166* 193*    Iron Studies: No results for input(s): IRON, TIBC, TRANSFERRIN, FERRITIN in the last 72 hours. Studies/Results: DG Chest Port 1 View  Result Date: 11/14/2020 CLINICAL DATA:  Acute respiratory failure. EXAM: PORTABLE CHEST 1 VIEW COMPARISON:  11/13/2020 FINDINGS: Cardiomegaly, tracheostomy tube, RIGHT IJ central venous catheter with tip overlying the mid SVC, RIGHT PICC line with tip overlying the SUPERIOR cavoatrial junction and LEFT-sided pacemaker/AICD again noted. Bilateral airspace opacities are stable to minimally improved. No large pleural effusion or pneumothorax identified. No other significant change noted. IMPRESSION: Stable to minimally improved bilateral airspace opacities. No other  significant change. Electronically Signed   By: Margarette Canada M.D.   On: 11/14/2020 08:17   DG Chest Port 1 View  Result Date: 11/13/2020 CLINICAL DATA:  Respiratory failure. EXAM: PORTABLE CHEST 1 VIEW COMPARISON:  11/12/2020. FINDINGS: Tracheostomy tube, right IJ line, right PICC line in stable position. Cardiac pacer stable position. Stable cardiomegaly. Diffuse bilateral pulmonary infiltrates/edema again noted. No interim change. No pleural effusion. No pneumothorax. IMPRESSION: 1.  Lines and tubes in stable position. 2.  Cardiac pacer in stable position.  Cardiomegaly again noted. 3. Diffuse bilateral pulmonary infiltrates/edema again noted. No interim change. Electronically Signed   By: Marcello Moores  Register   On: 11/13/2020 06:00   DG CHEST PORT 1 VIEW  Result Date: 11/12/2020 CLINICAL DATA:  Tracheostomy tube placement EXAM: PORTABLE CHEST 1 VIEW COMPARISON:  Portable exam 1431 hours compared to 0548 hours FINDINGS: Tracheostomy tube projects over tracheal air column. RIGHT jugular line and RIGHT arm PICC line with tips projecting over SVC. LEFT subclavian ICD with lead projecting over RIGHT ventricle. Enlargement of cardiac silhouette. BILATERAL pulmonary infiltrates RIGHT greater than LEFT consistent with multifocal pneumonia unchanged. No pleural effusion or pneumothorax. IMPRESSION: Persistent BILATERAL pulmonary infiltrates consistent with multifocal pneumonia. Electronically Signed   By: Lavonia Dana M.D.   On: 11/12/2020 16:13    Medications: Infusions: .  prismasol BGK 4/2.5 1,000 mL/hr at 11/14/20 1138  .  prismasol BGK 4/2.5 500 mL/hr at 11/14/20 1138  . sodium chloride 10 mL/hr at 11/14/20 1200  . sodium chloride Stopped (11/08/20 1119)  . sodium chloride    . sodium chloride 0 mL (11/10/20 0701)  . sodium chloride    . amiodarone Stopped (11/11/20 0246)  . anidulafungin 100 mg (11/14/20 0928)  . fentaNYL infusion INTRAVENOUS 100 mcg/hr (11/14/20 1200)  . norepinephrine (LEVOPHED)  Adult infusion 4 mcg/min (11/14/20 1200)  . piperacillin-tazobactam Stopped (11/14/20 0746)  . potassium PHOSPHATE IVPB (in mmol) 85 mL/hr at 11/14/20 1200  . prismasol BGK 4/2.5 1,500 mL/hr at 11/14/20 1138  . promethazine (PHENERGAN) injection (IM or IVPB) Stopped (11/10/20 1538)  . TPN ADULT (ION) 150 mL/hr at 11/14/20 1200  . TPN ADULT (ION)    . vasopressin 0.03 Units/min (11/14/20 1200)    Scheduled Medications: . chlorhexidine gluconate (MEDLINE KIT)  15 mL Mouth Rinse BID  . Chlorhexidine Gluconate Cloth  6 each Topical Q0600  . heparin injection (subcutaneous)  5,000 Units Subcutaneous Q8H  . insulin aspart  0-15 Units Subcutaneous Q4H  .  lidocaine  5 mL Intradermal Once  . mouth rinse  15 mL Mouth Rinse 10 times per day  . pantoprazole (PROTONIX) IV  40 mg Intravenous BID  . sodium chloride flush  10-40 mL Intracatheter Q12H  . sodium chloride flush  5 mL Intracatheter Q8H    have reviewed scheduled and prn medications.  Physical Exam: General: Lying in bed, on vent Heart:RRR, s1s2 nl Lungs: Clear anteriorly Abdomen:soft, non-distended Extremities: Trace LE edema Dialysis Access: IJ HD temp catheter in place.  Rexene Agent 11/14/2020,12:25 PM  LOS: 53 days

## 2020-11-14 NOTE — Progress Notes (Signed)
PHARMACY - TOTAL PARENTERAL NUTRITION CONSULT NOTE  Indication:  Short bowel syndrome  Patient Measurements: (updated) Height: 6\' 2"  (188 cm) Weight: 97.4 kg (214 lb 11.7 oz) IBW/kg (Calculated) : 82.2 TPN AdjBW (KG): 95.3 Body mass index is 27.57 kg/m.  Assessment:  53 YOM with history of mass adjacent to head of the pancreas in 2021. 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  Developed cholecystitis in Feb 2022 and had percutaneous cholecystectomy tube placement.  Presented this admission for neuroendocrine tumor resection with Whipple procedure, also had right total colectomy, colostomy and cholecystectomy on 23-Sep-2020. SMV was lacerated during procedure and repaired by bovine pericardial patch. Developed hemorrhagic shock with bile leak post-op and underwent placement of biliary T tube with abdominal washout on 3/15 PM. Antimicrobial therapies discontinued 4/20. Pharmacy consulted to manage TPN.   Glucose / Insulin: no hx DM, A1c 5.2%. CBGs consistently down a bit to 166-282. Stress steroids d/c'd again 5/5 Utilized 32 units sSSI in last 24 hours + 70 units in TPN Electrolytes: Transitioned back to CRRT on 5/3 - K 3.7 on 4K CRRT bags, Phos down to 1.8, Mg 2.5, corrected Ca ~10.4, iCa 1.02 (5/4) Renal: CRRT 4/9-4/11 > iHD > back to CRRT on 5/3  Hepatic: LFTs mildly elevated - stable, Tbili up to 6.5, TG back up to 193. Albumin 1.2, prealbumin up to 17.8 TG peaked at 740 (3/26) - last value 153 (5/2)  Intake / Output; MIVF: none to minimal UOP, drain output (5 total): 740 mL, net +15L GI Imaging: 4/6 CT - pelvic fluid collection concerning for abscess 4/12 CT - no new fluid collections 4/28 CT - new fluid+gas collection in RLQ GI Surgeries / Procedures: 3/14 Whipple procedure with R hemicolectomy and end ileostomy, cholecystectomy 3/15 washout, VAC placement 3/17 washout, VAC replacement.  Necrosis of entire small bowel except for the PB limb and proximal  40-50cm of jejunum 3/19 re-opening laparotomy, abd closure.  Necrotic SB. 3/21 SB resection, take down of choledochojejunal anastomosis and pancreaticojejunal anastomosis, take down of duodenojejunal anastomosis, placement of externalized biliary drain / Stamm gastrostomy tube, IA washout and placement of intraperitoneal drains 3/30 extubation> 4/8 trach 4/12 left lateral abdominal drain placed by IR  Central access: R IJ CVC placed 3/15; changed to PICC placed 10/03/20 TPN start date: 10/06/2020  Nutritional Goals (RD recommendation on 5/5): 2800-3000 kCal, 185-210g AA, fluid >2L/day  Lipid days (MWF): 3340 kcal with 205g protein, 540g CHO, 68g SMOF lipid NON-lipid days (TThSaSun): 2657kcal with 205g protein, 540g CHO  Current Nutrition:  TPN; NPO  Plan: - Continue TPN infusion at goal rate of 150 mL/hr at 1800 - Continue 19g/L SMOF lipids on MWF to prevent EFAD due to elevated TG - TPN will provide weekly average of 2949 kCal and 205g AA per day, meeting 100% of needs - Electrolytes in TPN: Na 133mEq/L, increase K to 12 meq/L, Ca 5 mEq/L, decrease Mg to 3 mEq/L, increase Phos to 18 mmol/L, Cl:Ac 1:1 - KPhos 76mmol IV x 1 already ordered per CCM - Add daily multivitamin to TPN. Remove standard trace elements and add back zinc 5mg  and selenium 19mcg. Remove chromium while on RRT. - Continue sSSI + increase to 75 units insulin regular in TPN and adjust as needed  - Standard TPN labs on Mon/Thurs. Watch TG trend closely   Arturo Morton, PharmD, BCPS Please check AMION for all [place] contact numbers Clinical Pharmacist 11/14/2020 7:50 AM

## 2020-11-14 NOTE — Progress Notes (Signed)
28 Days Post-Op  Subjective: No major changes. Remains on levo and vaso. Cultures remain negative. CXR appears minimally improved this morning.  Objective: Vital signs in last 24 hours: Temp:  [96 F (35.6 C)-97.6 F (36.4 C)] 97.6 F (36.4 C) (05/06 0400) Pulse Rate:  [59-100] 71 (05/06 0600) Resp:  [10-29] 21 (05/06 0600) BP: (78-118)/(45-84) 103/63 (05/06 0600) SpO2:  [100 %] 100 % (05/06 0600) Arterial Line BP: (100-141)/(49-69) 124/59 (05/06 0600) FiO2 (%):  [40 %-65 %] 40 % (05/06 0345) Weight:  [106.4 kg] 106.4 kg (05/06 0500) Last BM Date:  (pta)  Intake/Output from previous day: 05/05 0701 - 05/06 0700 In: 4488.6 [I.V.:4288.6; IV Piggyback:200] Out: 5607 [Drains:875] Intake/Output this shift: Total I/O In: 2102.4 [I.V.:2002.4; IV Piggyback:100] Out: 2722 [Drains:495; Other:2227]  PE: General: resting comfortably, NAD Neuro: no focal deficits HEENT: trach in place, site is clean Resp: trach in place, on vent Vent Mode: PRVC FiO2 (%):  [40 %-65 %] 40 % Set Rate:  [24 bmp] 24 bmp Vt Set:  [650 mL] 650 mL PEEP:  [8 cmH20] 8 cmH20 Plateau Pressure:  [30 cmH20-35 cmH20] 30 cmH20 CV: RRR Abdomen: soft, nondistended, vac in place on midline wound. RUQ JP x1 with bilious fluid. Biliary drain with bile. LUQ perc drain to gravity drainage with purulent fluid. Pancreatic stent draining clear colorless fluid. G tube in place to gravity drainage, draining clear nonbloody gastric contents. Extremities: warm and well-perfused  Lab Results:  Recent Labs    11/13/20 0844 11/14/20 0508  WBC 13.8* 17.8*  HGB 8.1* 8.3*  HCT 25.0* 25.1*  PLT 160 172   BMET Recent Labs    11/13/20 1618 11/14/20 0508  NA 136 136  K 3.5 3.7  CL 102 102  CO2 27 27  GLUCOSE 314* 213*  BUN 50* 46*  CREATININE 1.83* 1.63*  CALCIUM 7.9* 8.1*   PT/INR No results for input(s): LABPROT, INR in the last 72 hours. CMP     Component Value Date/Time   NA 136 11/14/2020 0508   NA 142  07/21/2020 0921   K 3.7 11/14/2020 0508   CL 102 11/14/2020 0508   CO2 27 11/14/2020 0508   GLUCOSE 213 (H) 11/14/2020 0508   BUN 46 (H) 11/14/2020 0508   BUN 16 07/21/2020 0921   CREATININE 1.63 (H) 11/14/2020 0508   CREATININE 1.16 03/01/2016 1607   CALCIUM 8.1 (L) 11/14/2020 0508   PROT 6.4 (L) 11/14/2020 0508   PROT 6.7 07/21/2020 0921   ALBUMIN 1.2 (L) 11/14/2020 0508   ALBUMIN 4.1 07/21/2020 0921   AST 105 (H) 11/14/2020 0508   ALT 189 (H) 11/14/2020 0508   ALKPHOS 101 11/14/2020 0508   BILITOT 6.5 (H) 11/14/2020 0508   BILITOT 0.8 07/21/2020 0921   GFRNONAA 51 (L) 11/14/2020 0508   GFRNONAA >89 09/22/2015 1008   GFRAA 82 07/21/2020 0921   GFRAA >89 09/22/2015 1008   Lipase     Component Value Date/Time   LIPASE 23 08/22/2020 0318       Assessment/Plan 50 yo male 6 weeks s/p Whipple, right colectomy and SMV repair for T3N0 neuroendocrine tumor. C/b postop hemorrhagic shock with DIC and subsequent diffuse small bowel necrosis, now s/p small bowel resection with external drainage of bile duct, pancreatic duct and stomach. - Continue broad-spectrum antibiotics and antifungal. Cultures remain negative to date. - CRRT - Continue TPN - Fentanyl for pain control - VTE: SQH - Dispo: ICU. Appreciate assistance from CCM and palliative. Palliative care  met with family yesterday but per their notes family does not seem very receptive to discussions and want to continue aggressive medical care. Patient has survived multiple episodes of sepsis/shock at this point and currently seems to be slowly improving. I think it is reasonable to continue our current interventions and see if he is able to make further progress. I have emphasized with family in prior discussions that although a transplant would be the ideal end goal for Algis, there are many criteria he has to meet to get to that point, that he has to be an outpatient and ambulatory to even be considered for a transplant (per my  prior discussions directly with the [facility] transplant team), and that he is currently far from that. I agree with sustaining the DNR order even as he is clinically improving, because if he has a cardiopulmonary arrest, attempts at resuscitation at that point would not change his outcome and would likely lead to prolonged suffering.   LOS: 21 days    Michaelle Birks, MD [facility] Surgery General, Hepatobiliary and Pancreatic Surgery 11/14/20 6:51 AM    28 Days Post-Op  Subjective: Slowly weaning levophed, at 6 this morning. Vasopressin remains on. WBC downtrending. Tbili 6 today. Output from biliary drain has been low the last 2 days. Afebrile. BAL and blood cultures remain negative to date.   Objective: Vital signs in last 24 hours: Temp:  [96 F (35.6 C)-97.6 F (36.4 C)] 97.6 F (36.4 C) (05/06 0400) Pulse Rate:  [59-100] 71 (05/06 0600) Resp:  [10-29] 21 (05/06 0600) BP: (78-118)/(45-84) 103/63 (05/06 0600) SpO2:  [100 %] 100 % (05/06 0600) Arterial Line BP: (100-141)/(49-69) 124/59 (05/06 0600) FiO2 (%):  [40 %-65 %] 40 % (05/06 0345) Weight:  [106.4 kg] 106.4 kg (05/06 0500) Last BM Date:  (pta)  Intake/Output from previous day: 05/05 0701 - 05/06 0700 In: 4488.6 [I.V.:4288.6; IV Piggyback:200] Out: 5607 [Drains:875] Intake/Output this shift: Total I/O In: 2102.4 [I.V.:2002.4; IV Piggyback:100] Out: 2722 [Drains:495; Other:2227]  PE: General: resting comfortably, NAD Neuro: alert, no focal deficits HEENT: trach in place, site is clean Resp: trach in place, on vent Vent Mode: PRVC FiO2 (%):  [40 %-65 %] 40 % Set Rate:  [24 bmp] 24 bmp Vt Set:  [650 mL] 650 mL PEEP:  [8 cmH20] 8 cmH20 Plateau Pressure:  [30 cmH20-35 cmH20] 30 cmH20 CV: irregular rhythm, rate in 70s Abdomen: soft, nondistended, vac in place on midline wound. RUQ JP x1 with bilious fluid. Biliary drain with bile. LUQ perc drain to gravity drainage with purulent fluid. Pancreatic stent draining  clear colorless fluid. G tube in place to gravity drainage, draining clear nonbloody gastric contents. Extremities: warm and well-perfused  Lab Results:  Recent Labs    11/13/20 0844 11/14/20 0508  WBC 13.8* 17.8*  HGB 8.1* 8.3*  HCT 25.0* 25.1*  PLT 160 172   BMET Recent Labs    11/13/20 1618 11/14/20 0508  NA 136 136  K 3.5 3.7  CL 102 102  CO2 27 27  GLUCOSE 314* 213*  BUN 50* 46*  CREATININE 1.83* 1.63*  CALCIUM 7.9* 8.1*   PT/INR No results for input(s): LABPROT, INR in the last 72 hours. CMP     Component Value Date/Time   NA 136 11/14/2020 0508   NA 142 07/21/2020 0921   K 3.7 11/14/2020 0508   CL 102 11/14/2020 0508   CO2 27 11/14/2020 0508   GLUCOSE 213 (H) 11/14/2020 0508   BUN 46 (H)  11/14/2020 0508   BUN 16 07/21/2020 0921   CREATININE 1.63 (H) 11/14/2020 0508   CREATININE 1.16 03/01/2016 1607   CALCIUM 8.1 (L) 11/14/2020 0508   PROT 6.4 (L) 11/14/2020 0508   PROT 6.7 07/21/2020 0921   ALBUMIN 1.2 (L) 11/14/2020 0508   ALBUMIN 4.1 07/21/2020 0921   AST 105 (H) 11/14/2020 0508   ALT 189 (H) 11/14/2020 0508   ALKPHOS 101 11/14/2020 0508   BILITOT 6.5 (H) 11/14/2020 0508   BILITOT 0.8 07/21/2020 0921   GFRNONAA 51 (L) 11/14/2020 0508   GFRNONAA >89 09/22/2015 1008   GFRAA 82 07/21/2020 0921   GFRAA >89 09/22/2015 1008   Lipase     Component Value Date/Time   LIPASE 23 08/22/2020 0318       Assessment/Plan 50 yo male 6 weeks s/p Whipple, right colectomy and SMV repair for T3N0 neuroendocrine tumor. C/b postop hemorrhagic shock with DIC and subsequent diffuse small bowel necrosis, now s/p small bowel resection with external drainage of bile duct, pancreatic duct and stomach. - Continue broad-spectrum antibiotics and antifungal. Cultures remain negative to date. - Rising bilirubin is probably secondary to peritoneal absorption of bile, as the biliary drain is not adequately draining and there is leakage around it, as evidenced by increased  output from the adjacent JP drain. I flushed the biliary drainage catheter this morning and aspirated bile, hopefully this will improve drainage. - CRRT - Continue TPN - Fentanyl for pain control - VTE: SQH - Dispo: ICU. Appreciate assistance from CCM and palliative. Continue current medical management. Patient's overall prognosis remains poor. Maintain DNR status.   LOS: 54 days    Michaelle Birks, MD [facility] Surgery General, Hepatobiliary and Pancreatic Surgery 11/14/20 6:51 AM

## 2020-11-15 DIAGNOSIS — E43 Unspecified severe protein-calorie malnutrition: Secondary | ICD-10-CM

## 2020-11-15 DIAGNOSIS — R5381 Other malaise: Secondary | ICD-10-CM

## 2020-11-15 DIAGNOSIS — J9601 Acute respiratory failure with hypoxia: Secondary | ICD-10-CM | POA: Diagnosis not present

## 2020-11-15 DIAGNOSIS — N179 Acute kidney failure, unspecified: Secondary | ICD-10-CM | POA: Diagnosis not present

## 2020-11-15 DIAGNOSIS — Z66 Do not resuscitate: Secondary | ICD-10-CM

## 2020-11-15 DIAGNOSIS — D3A8 Other benign neuroendocrine tumors: Secondary | ICD-10-CM | POA: Diagnosis not present

## 2020-11-15 DIAGNOSIS — Z789 Other specified health status: Secondary | ICD-10-CM

## 2020-11-15 DIAGNOSIS — B49 Unspecified mycosis: Secondary | ICD-10-CM

## 2020-11-15 LAB — RENAL FUNCTION PANEL
Albumin: 1.3 g/dL — ABNORMAL LOW (ref 3.5–5.0)
Albumin: 1.3 g/dL — ABNORMAL LOW (ref 3.5–5.0)
Anion gap: 8 (ref 5–15)
Anion gap: 7 (ref 5–15)
BUN: 42 mg/dL — ABNORMAL HIGH (ref 6–20)
BUN: 40 mg/dL — ABNORMAL HIGH (ref 6–20)
CO2: 25 mmol/L (ref 22–32)
CO2: 26 mmol/L (ref 22–32)
Calcium: 8.2 mg/dL — ABNORMAL LOW (ref 8.9–10.3)
Calcium: 8 mg/dL — ABNORMAL LOW (ref 8.9–10.3)
Chloride: 102 mmol/L (ref 98–111)
Chloride: 102 mmol/L (ref 98–111)
Creatinine, Ser: 1.57 mg/dL — ABNORMAL HIGH (ref 0.61–1.24)
Creatinine, Ser: 1.49 mg/dL — ABNORMAL HIGH (ref 0.61–1.24)
GFR, Estimated: 54 mL/min — ABNORMAL LOW (ref >60)
GFR, Estimated: 57 mL/min — ABNORMAL LOW (ref >60)
Glucose, Bld: 154 mg/dL — ABNORMAL HIGH (ref 70–99)
Glucose, Bld: 147 mg/dL — ABNORMAL HIGH (ref 70–99)
Phosphorus: 3 mg/dL (ref 2.5–4.6)
Phosphorus: 3.4 mg/dL (ref 2.5–4.6)
Potassium: 3.7 mmol/L (ref 3.5–5.1)
Potassium: 4.2 mmol/L (ref 3.5–5.1)
Sodium: 135 mmol/L (ref 135–145)
Sodium: 135 mmol/L (ref 135–145)

## 2020-11-15 LAB — CBC
HCT: 25.1 % — ABNORMAL LOW (ref 39.0–52.0)
Hemoglobin: 8.1 g/dL — ABNORMAL LOW (ref 13.0–17.0)
MCH: 26 pg (ref 26.0–34.0)
MCHC: 32.3 g/dL (ref 30.0–36.0)
MCV: 80.4 fL (ref 80.0–100.0)
Platelets: 174 10*3/uL (ref 150–400)
RBC: 3.12 MIL/uL — ABNORMAL LOW (ref 4.22–5.81)
RDW: 23.1 % — ABNORMAL HIGH (ref 11.5–15.5)
WBC: 20.4 10*3/uL — ABNORMAL HIGH (ref 4.0–10.5)
nRBC: 5.9 % — ABNORMAL HIGH (ref 0.0–0.2)

## 2020-11-15 LAB — BASIC METABOLIC PANEL
Anion gap: 7 (ref 5–15)
BUN: 43 mg/dL — ABNORMAL HIGH (ref 6–20)
CO2: 26 mmol/L (ref 22–32)
Calcium: 8 mg/dL — ABNORMAL LOW (ref 8.9–10.3)
Chloride: 101 mmol/L (ref 98–111)
Creatinine, Ser: 1.56 mg/dL — ABNORMAL HIGH (ref 0.61–1.24)
GFR, Estimated: 54 mL/min — ABNORMAL LOW (ref >60)
Glucose, Bld: 170 mg/dL — ABNORMAL HIGH (ref 70–99)
Potassium: 3.7 mmol/L (ref 3.5–5.1)
Sodium: 134 mmol/L — ABNORMAL LOW (ref 135–145)

## 2020-11-15 LAB — GLUCOSE, CAPILLARY
Glucose-Capillary: 131 mg/dL — ABNORMAL HIGH (ref 70–99)
Glucose-Capillary: 135 mg/dL — ABNORMAL HIGH (ref 70–99)
Glucose-Capillary: 140 mg/dL — ABNORMAL HIGH (ref 70–99)
Glucose-Capillary: 146 mg/dL — ABNORMAL HIGH (ref 70–99)
Glucose-Capillary: 146 mg/dL — ABNORMAL HIGH (ref 70–99)
Glucose-Capillary: 154 mg/dL — ABNORMAL HIGH (ref 70–99)

## 2020-11-15 LAB — MAGNESIUM
Magnesium: 2.6 mg/dL — ABNORMAL HIGH (ref 1.7–2.4)
Magnesium: 2.4 mg/dL (ref 1.7–2.4)

## 2020-11-15 MED ORDER — POTASSIUM CHLORIDE 10 MEQ/50ML IV SOLN
10.0000 meq | INTRAVENOUS | Status: AC
  Administered 2020-11-15 (×3): 10 meq via INTRAVENOUS
  Filled 2020-11-15 (×3): qty 50

## 2020-11-15 MED ORDER — DIGOXIN 0.25 MG/ML IJ SOLN
0.5000 mg | Freq: Once | INTRAMUSCULAR | Status: AC
  Administered 2020-11-15: 0.5 mg via INTRAVENOUS
  Filled 2020-11-15: qty 2

## 2020-11-15 MED ORDER — PHENYLEPHRINE HCL-NACL 10-0.9 MG/250ML-% IV SOLN
0.0000 ug/min | INTRAVENOUS | Status: DC
  Administered 2020-11-15: 20 ug/min via INTRAVENOUS
  Filled 2020-11-15: qty 250

## 2020-11-15 MED ORDER — ZINC CHLORIDE 1 MG/ML IV SOLN
INTRAVENOUS | Status: AC
  Filled 2020-11-15: qty 2052

## 2020-11-15 MED ORDER — PHENYLEPHRINE CONCENTRATED 100MG/250ML (0.4 MG/ML) INFUSION SIMPLE
0.0000 ug/min | INTRAVENOUS | Status: DC
  Administered 2020-11-15: 160 ug/min via INTRAVENOUS
  Administered 2020-11-15: 150 ug/min via INTRAVENOUS
  Administered 2020-11-16: 140 ug/min via INTRAVENOUS
  Administered 2020-11-18: 35 ug/min via INTRAVENOUS
  Administered 2020-11-19: 70 ug/min via INTRAVENOUS
  Administered 2020-11-20: 160 ug/min via INTRAVENOUS
  Administered 2020-11-20: 150 ug/min via INTRAVENOUS
  Administered 2020-11-21 (×2): 160 ug/min via INTRAVENOUS
  Administered 2020-11-22 (×2): 250 ug/min via INTRAVENOUS
  Administered 2020-11-22: 200 ug/min via INTRAVENOUS
  Administered 2020-11-23 – 2020-11-24 (×5): 250 ug/min via INTRAVENOUS
  Filled 2020-11-15 (×22): qty 250

## 2020-11-15 NOTE — Progress Notes (Signed)
29 Days Post-Op   Subjective/Chief Complaint: Pt with no acute changes ICD turned back on last night.  Battery ending its life cycle   Objective: Vital signs in last 24 hours: Temp:  [98.1 F (36.7 C)-98.5 F (36.9 C)] 98.3 F (36.8 C) (05/07 0400) Pulse Rate:  [67-122] 99 (05/07 0817) Resp:  [16-33] 25 (05/07 0817) BP: (86-110)/(49-92) 86/54 (05/07 0817) SpO2:  [93 %-100 %] 99 % (05/07 0817) Arterial Line BP: (95-133)/(39-61) 127/50 (05/07 0800) FiO2 (%):  [40 %] 40 % (05/07 0817) Weight:  [709 kg] 111 kg (05/07 0400) Last BM Date:  (pta)  Intake/Output from previous day: 05/06 0701 - 05/07 0700 In: 5304.3 [I.V.:4594; IV Piggyback:710.3] Out: 6445 [Urine:47; Drains:963] Intake/Output this shift: Total I/O In: 192.5 [I.V.:192.5] Out: -   PE: Const: follows CV: RRR Abdomen: soft, nondistended, vac in place on midline wound. RUQ JP x1 with bilious fluid. Biliary drain with bile. LUQ perc drain to gravity drainage with purulent fluid. Pancreatic stent draining clear colorless fluid. G tube in place to gravity drainage, draining clear nonbloody gastric contents. Extremities: warm and well-perfused  Lab Results:  Recent Labs    11/14/20 0508 11/15/20 0455  WBC 17.8* 20.4*  HGB 8.3* 8.1*  HCT 25.1* 25.1*  PLT 172 174   BMET Recent Labs    11/15/20 0028 11/15/20 0455  NA 134* 135  K 3.7 3.7  CL 101 102  CO2 26 25  GLUCOSE 170* 154*  BUN 43* 42*  CREATININE 1.56* 1.57*  CALCIUM 8.0* 8.2*   PT/INR No results for input(s): LABPROT, INR in the last 72 hours. ABG No results for input(s): PHART, HCO3 in the last 72 hours.  Invalid input(s): PCO2, PO2  Studies/Results: DG Chest Port 1 View  Result Date: 11/14/2020 CLINICAL DATA:  Acute respiratory failure. EXAM: PORTABLE CHEST 1 VIEW COMPARISON:  11/13/2020 FINDINGS: Cardiomegaly, tracheostomy tube, RIGHT IJ central venous catheter with tip overlying the mid SVC, RIGHT PICC line with tip overlying the SUPERIOR  cavoatrial junction and LEFT-sided pacemaker/AICD again noted. Bilateral airspace opacities are stable to minimally improved. No large pleural effusion or pneumothorax identified. No other significant change noted. IMPRESSION: Stable to minimally improved bilateral airspace opacities. No other significant change. Electronically Signed   By: Margarette Canada M.D.   On: 11/14/2020 08:17    Anti-infectives: Anti-infectives (From admission, onward)   Start     Dose/Rate Route Frequency Ordered Stop   11/12/20 1000  anidulafungin (ERAXIS) 100 mg in sodium chloride 0.9 % 100 mL IVPB        100 mg 78 mL/hr over 100 Minutes Intravenous Every 24 hours 11/11/20 0813     11/12/20 1000  vancomycin (VANCOREADY) IVPB 1000 mg/200 mL  Status:  Discontinued        1,000 mg 200 mL/hr over 60 Minutes Intravenous Every 24 hours 11/11/20 0829 11/13/20 0758   11/11/20 1400  piperacillin-tazobactam (ZOSYN) IVPB 3.375 g  Status:  Discontinued        3.375 g 12.5 mL/hr over 240 Minutes Intravenous Every 6 hours 11/11/20 0829 11/11/20 0830   11/11/20 1400  piperacillin-tazobactam (ZOSYN) IVPB 3.375 g        3.375 g 100 mL/hr over 30 Minutes Intravenous Every 6 hours 11/11/20 0830     11/11/20 0900  vancomycin (VANCOREADY) IVPB 2000 mg/400 mL        2,000 mg 200 mL/hr over 120 Minutes Intravenous  Once 11/11/20 0813 11/11/20 1124   11/11/20 0900  anidulafungin (ERAXIS) 200  mg in sodium chloride 0.9 % 200 mL IVPB        200 mg 78 mL/hr over 200 Minutes Intravenous  Once 11/11/20 0813 11/11/20 1328   11/09/20 2200  piperacillin-tazobactam (ZOSYN) 2.25 g in sodium chloride 0.9 % 50 mL IVPB  Status:  Discontinued        2.25 g 100 mL/hr over 30 Minutes Intravenous Every 8 hours 11/09/20 1611 11/11/20 0829   11/09/20 1700  piperacillin-tazobactam (ZOSYN) IVPB 3.375 g        3.375 g 100 mL/hr over 30 Minutes Intravenous  Once 11/09/20 1611 11/09/20 1657   10/26/20 1000  piperacillin-tazobactam (ZOSYN) IVPB 2.25 g  Status:   Discontinued        2.25 g 100 mL/hr over 30 Minutes Intravenous Every 12 hours 10/25/20 2233 10/26/20 0715   10/26/20 0815  piperacillin-tazobactam (ZOSYN) IVPB 2.25 g  Status:  Discontinued        2.25 g 100 mL/hr over 30 Minutes Intravenous Every 8 hours 10/26/20 0715 10/29/20 0647   10/22/20 0930  anidulafungin (ERAXIS) 100 mg in sodium chloride 0.9 % 100 mL IVPB  Status:  Discontinued       "Followed by" Linked Group Details   100 mg 78 mL/hr over 100 Minutes Intravenous Every 24 hours 10/21/20 0840 10/29/20 0647   10/21/20 2200  piperacillin-tazobactam (ZOSYN) IVPB 2.25 g  Status:  Discontinued        2.25 g 100 mL/hr over 30 Minutes Intravenous Every 8 hours 10/21/20 1410 10/25/20 2233   10/21/20 1400  piperacillin-tazobactam (ZOSYN) IVPB 2.25 g  Status:  Discontinued        2.25 g 100 mL/hr over 30 Minutes Intravenous Every 8 hours 10/21/20 0742 10/21/20 1410   10/21/20 0930  anidulafungin (ERAXIS) 200 mg in sodium chloride 0.9 % 200 mL IVPB       "Followed by" Linked Group Details   200 mg 78 mL/hr over 200 Minutes Intravenous  Once 10/21/20 0840 10/21/20 1411   10/19/20 1600  piperacillin-tazobactam (ZOSYN) IVPB 3.375 g  Status:  Discontinued        3.375 g 100 mL/hr over 30 Minutes Intravenous Every 6 hours 10/19/20 1048 10/21/20 0742   10/18/20 1600  piperacillin-tazobactam (ZOSYN) IVPB 3.375 g  Status:  Discontinued        3.375 g 100 mL/hr over 30 Minutes Intravenous Every 6 hours 10/18/20 1123 10/19/20 1048   10/15/20 0400  piperacillin-tazobactam (ZOSYN) IVPB 3.375 g  Status:  Discontinued        3.375 g 12.5 mL/hr over 240 Minutes Intravenous Every 8 hours 10/14/20 2107 10/18/20 1123   10/14/20 2200  piperacillin-tazobactam (ZOSYN) IVPB 3.375 g        3.375 g 100 mL/hr over 30 Minutes Intravenous  Once 10/14/20 2107 10/14/20 2238   10/01/20 1100  anidulafungin (ERAXIS) 100 mg in sodium chloride 0.9 % 100 mL IVPB  Status:  Discontinued       "Followed by" Linked Group  Details   100 mg 78 mL/hr over 100 Minutes Intravenous Every 24 hours 09/30/20 1014 10/08/20 0951   09/30/20 1100  anidulafungin (ERAXIS) 200 mg in sodium chloride 0.9 % 200 mL IVPB       "Followed by" Linked Group Details   200 mg 78 mL/hr over 200 Minutes Intravenous  Once 09/30/20 1014 09/30/20 1516   09/30/2020 1600  vancomycin (VANCOREADY) IVPB 1000 mg/200 mL  Status:  Discontinued        1,000 mg 200  mL/hr over 60 Minutes Intravenous Every 24 hours 10/06/2020 1137 09/28/2020 1510   09/20/2020 1400  piperacillin-tazobactam (ZOSYN) IVPB 3.375 g  Status:  Discontinued        3.375 g 12.5 mL/hr over 240 Minutes Intravenous Every 8 hours 09/16/2020 1322 10/08/20 0952   09/13/2020 1430  vancomycin (VANCOREADY) IVPB 1750 mg/350 mL  Status:  Discontinued        1,750 mg 175 mL/hr over 120 Minutes Intravenous Every 24 hours 09/15/2020 1349 09/14/2020 1137   09/26/20 1130  vancomycin (VANCOREADY) IVPB 2000 mg/400 mL        2,000 mg 200 mL/hr over 120 Minutes Intravenous  Once 09/26/20 1031 09/26/20 1411   09/26/20 1030  vancomycin variable dose per unstable renal function (pharmacist dosing)  Status:  Discontinued         Does not apply See admin instructions 09/26/20 1031 09/28/20 0816   09/12/2020 2200  metroNIDAZOLE (FLAGYL) IVPB 500 mg  Status:  Discontinued        500 mg 100 mL/hr over 60 Minutes Intravenous Every 8 hours 09/10/2020 1820 09/24/2020 1322   09/19/2020 2000  cefTRIAXone (ROCEPHIN) 2 g in sodium chloride 0.9 % 100 mL IVPB  Status:  Discontinued        2 g 200 mL/hr over 30 Minutes Intravenous Every 24 hours 09/09/2020 1820 09/19/2020 1322   10/06/2020 1330  vancomycin (VANCOCIN) 2,250 mg in sodium chloride 0.9 % 500 mL IVPB  Status:  Discontinued        2,250 mg 250 mL/hr over 120 Minutes Intravenous Every 48 hours 10/03/2020 1228 09/15/2020 1320   09/12/2020 1300  cefTRIAXone (ROCEPHIN) 2 g in sodium chloride 0.9 % 100 mL IVPB  Status:  Discontinued        2 g 200 mL/hr over 30 Minutes Intravenous Every  24 hours 10/04/2020 1208 09/09/2020 1320   09/28/2020 1300  metroNIDAZOLE (FLAGYL) IVPB 500 mg  Status:  Discontinued        500 mg 100 mL/hr over 60 Minutes Intravenous Every 8 hours 09/24/2020 1208 09/24/2020 1320   10/06/2020 0915  metroNIDAZOLE (FLAGYL) IVPB 500 mg  Status:  Discontinued        500 mg 100 mL/hr over 60 Minutes Intravenous To Surgery 09/21/2020 0903 10/03/2020 0951   09/21/2020 0630  ceFAZolin (ANCEF) IVPB 2g/100 mL premix       "And" Linked Group Details   2 g 200 mL/hr over 30 Minutes Intravenous On call to O.R. 09/24/2020 0938 09/10/2020 1215   10/09/2020 0630  metroNIDAZOLE (FLAGYL) IVPB 500 mg       "And" Linked Group Details   500 mg 100 mL/hr over 60 Minutes Intravenous On call to O.R. 10/09/2020 1829 09/24/2020 0820      Assessment/Plan: 50 yo male 6 weeks s/p Whipple, right colectomy and SMV repair for T3N0 neuroendocrine tumor. C/b postop hemorrhagic shock with DIC and subsequent diffuse small bowel necrosis, now s/p small bowel resection with external drainage of bile duct, pancreatic duct and stomach. -ICD turned on Yesterday.  Per [employer] rep, battery may need to be changed if it fires again.  RN aware - Continue broad-spectrum antibiotics and antifungal. Cultures remain negative to date. - CRRT - Continue TPN - Fentanyl for pain control - VTE: SQH - Dispo: ICU. Appreciate assistance from CCM and palliative. Palliative care met with family yesterday but per their notes family does not seem very receptive to discussions and want to continue aggressive medical care.  Cctime: 72mn  LOS: 54 days    Ralene Ok 11/15/2020

## 2020-11-15 NOTE — Plan of Care (Signed)
  Problem: Clinical Measurements: Goal: Ability to maintain clinical measurements within normal limits will improve Outcome: Progressing   

## 2020-11-15 NOTE — Progress Notes (Signed)
[place] Progress Note Patient Name: [name] DOB: October 22, 1970 MRN: [id number]   Date of Service  11/15/2020  HPI/Events of Note  K+ = 3.7 and Mg++ = 2.6. The patient already has 4.0 meq/liter in his CRRT fluid which is being managed by nephrology. Reluctant to push K+ above 4 with K+ replacement.   eICU Interventions  Continue present management.     Intervention Category Major Interventions: Electrolyte abnormality - evaluation and management  Gini Caputo [place] 11/15/2020, 1:37 AM

## 2020-11-15 NOTE — Progress Notes (Signed)
PHARMACY - TOTAL PARENTERAL NUTRITION CONSULT NOTE  Indication:  Short bowel syndrome  Patient Measurements: (updated) Height: 6\' 2"  (188 cm) Weight: 97.4 kg (214 lb 11.7 oz) IBW/kg (Calculated) : 82.2 TPN AdjBW (KG): 95.3 Body mass index is 27.57 kg/m.  Assessment:  47 YOM with history of mass adjacent to head of the pancreas in 2021. 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  Developed cholecystitis in Feb 2022 and had percutaneous cholecystectomy tube placement.  Presented this admission for neuroendocrine tumor resection with Whipple procedure, also had right total colectomy, colostomy and cholecystectomy on 2020/10/21. SMV was lacerated during procedure and repaired by bovine pericardial patch. Developed hemorrhagic shock with bile leak post-op and underwent placement of biliary T tube with abdominal washout on 3/15 PM. Antimicrobial therapies discontinued 4/20. Pharmacy consulted to manage TPN.   Glucose / Insulin: no hx DM, A1c 5.2%. CBGs <180. Stress steroids d/c'd again 5/5. Utilized 15 units mSSI in last 24 hours + 75 units in TPN Electrolytes: Transitioned back to CRRT on 5/3 - K 3.7 on 4K CRRT bags, Phos up to 3 (s/p Kphos 20mmol IV x 1 yesterday), corrected Ca ~10.4, iCa 1.02 (5/4) Renal: CRRT 4/9-4/11 > iHD > back to CRRT on 5/3  Hepatic: LFTs mildly elevated - stable, Tbili up to 6.5, TG back up to 193. Albumin 1.3, prealbumin up to 17.8 TG peaked at 740 (3/26) - last value 153 (5/2)  Intake / Output; MIVF: none to minimal UOP, drain output (5 total): 768 mL, net +12.7L GI Imaging: 4/6 CT - pelvic fluid collection concerning for abscess 4/12 CT - no new fluid collections 4/28 CT - new fluid+gas collection in RLQ GI Surgeries / Procedures: 3/14 Whipple procedure with R hemicolectomy and end ileostomy, cholecystectomy 3/15 washout, VAC placement 3/17 washout, VAC replacement.  Necrosis of entire small bowel except for the PB limb and proximal 40-50cm  of jejunum 3/19 re-opening laparotomy, abd closure.  Necrotic SB. 3/21 SB resection, take down of choledochojejunal anastomosis and pancreaticojejunal anastomosis, take down of duodenojejunal anastomosis, placement of externalized biliary drain / Stamm gastrostomy tube, IA washout and placement of intraperitoneal drains 3/30 extubation> 4/8 trach 4/12 left lateral abdominal drain placed by IR  Central access: R IJ CVC placed 3/15; changed to PICC placed 10/03/20 TPN start date: 09/11/2020  Nutritional Goals (RD recommendation on 5/5): 2800-3000 kCal, 185-210g AA, fluid >2L/day  Lipid days (MWF): 3340 kcal with 205g protein, 540g CHO, 68g SMOF lipid NON-lipid days (TThSaSun): 2657kcal with 205g protein, 540g CHO  Current Nutrition:  TPN; NPO  Plan: - Continue TPN infusion at goal rate of 150 mL/hr at 1800 - Continue 19g/L SMOF lipids on MWF to prevent EFAD due to elevated TG - TPN will provide weekly average of 2949 kCal and 205g AA per day, meeting 100% of needs - Electrolytes in TPN: Na 159mEq/L, increase K to 15 mEq/L, decrease Ca to 3 mEq/L, Mg 3 mEq/L, Phos 18 mmol/L, Cl:Ac 1:1 - Give K runs x 3 - Add daily multivitamin to TPN. Remove standard trace elements and add back zinc 5mg  and selenium 43mcg. Remove chromium while on RRT. - Continue mSSI + 75 units insulin regular in TPN and adjust as needed  - F/u AM labs. Watch TG trend closely   Arturo Morton, PharmD, BCPS Please check AMION for all [facility] contact numbers Clinical Pharmacist 11/15/2020 8:00 AM

## 2020-11-15 NOTE — Progress Notes (Signed)
eLink Physician-Brief Progress Note Patient Name: [name] DOB: 01-Dec-1970 MRN: [id number]   Date of Service  11/15/2020  HPI/Events of Note  12 Lead EKG reveals: Long QTc. Atrial flutter with variable A-V block Nonspecific ST and T wave abnormality. Ventricular rate 110's to 120's. Difficult situation with prolonged QTc interval (can't use Amiodarone) and requirement for Norepinephrine support of BP (can't use B-Blockers or Ca++ Channel Blockers. Creatinine = 1.56.   eICU Interventions  Plan: 1. Phenylephrine IV infusion. Titrate to MAP >= 65. 2.. Wean Norepinephrine IV infusion off. 3. Digoxin 0.5 mg IV X 1 now.  4. Await BMP and Mg++ results.     Intervention Category Major Interventions: Arrhythmia - evaluation and management  Bevely Hackbart [place] 11/15/2020, 12:48 AM

## 2020-11-15 NOTE — Progress Notes (Signed)
[name] NEPHROLOGY PROGRESS NOTE  Assessment/ Plan: Pt is a 50 y.o. yo male with neuroendocrine tumor status post Whipple procedure right colectomy and SMV repair, developed AKI on dialysis.  #AKI RRT dependent, prolonged, since 4/9. ATN.  No evidence of recovery and sig decompensation 5/3 req back on CRRT.  All 4K, slight neg UF, no anticoagulation.  Grim prognosis.  Palliative working with with family. Replete P if < 3.    #Acute respiratory failure required mechanical ventilation. Back on Vent, per CCM. Liekly HCAP; slowly improving  #Neuroendocrine tumor status post Whipple procedure with extensive small bowel necrosis status post resection.  TPN dependent  #Severe protein calorie malnutrition, hypoalbuminemia; TPN dependent  #Anemia: Hemoglobin stable.  Transfuse PRBC as needed.  Per CCM and CCS  #Hypotension: Remains on 2 pressors  # Hyperkalemia, resolved  # metabolic and resp acidosis, as above.    Subjective:   Transition to phenylephrine, remains on vasopressin  Stable on CRRT, tolerating UF, K3.7 and phosphorus 3.0   All 4K bath  Objective Vital signs in last 24 hours: Vitals:   11/15/20 0600 11/15/20 0700 11/15/20 0800 11/15/20 0817  BP: (!) 96/53 (!) 106/58 (!) 105/58 (!) 86/54  Pulse: 72 67 70 99  Resp: 19 (!) 24 16 (!) 25  Temp:      TempSrc:      SpO2: 95% 100% 99% 99%  Weight:      Height:       Weight change: 4.6 kg  Intake/Output Summary (Last 24 hours) at 11/15/2020 0931 Last data filed at 11/15/2020 0827 Gross per 24 hour  Intake 5083.13 ml  Output 6182 ml  Net -1098.87 ml       Labs: Basic Metabolic Panel: Recent Labs  Lab 11/14/20 0508 11/14/20 1613 11/15/20 0028 11/15/20 0455  NA 136 135 134* 135  K 3.7 4.2 3.7 3.7  CL 102 101 101 102  CO2 27 27 26 25   GLUCOSE 213* 200* 170* 154*  BUN 46* 45* 43* 42*  CREATININE 1.63* 1.56* 1.56* 1.57*  CALCIUM 8.1* 8.0* 8.0* 8.2*  PHOS 1.8* 3.8  --  3.0   Liver Function  Tests: Recent Labs  Lab 11/11/20 1448 11/12/20 0413 11/13/20 0430 11/13/20 1618 11/14/20 0508 11/14/20 1613 11/15/20 0455  AST 178*  --  161*  --  105*  --   --   ALT 156*  --  227*  --  189*  --   --   ALKPHOS 90  --  94  --  101  --   --   BILITOT 5.7*  --  6.3*  --  6.5*  --   --   PROT 8.2*  --  6.7  --  6.4*  --   --   ALBUMIN 1.5*  1.5*   < > 1.3*   < > 1.2* 1.3* 1.3*   < > = values in this interval not displayed.   No results for input(s): LIPASE, AMYLASE in the last 168 hours. No results for input(s): AMMONIA in the last 168 hours. CBC: Recent Labs  Lab 11/10/20 0501 11/11/20 0300 11/12/20 0413 11/12/20 0438 11/13/20 0430 11/13/20 0844 11/14/20 0508 11/15/20 0455  WBC 8.6   < > 25.1*  --  13.7* 13.8* 17.8* 20.4*  NEUTROABS 6.3  --   --   --  12.5* 11.7*  --   --   HGB 8.5*   < > 9.0*   < > 8.4* 8.1* 8.3* 8.1*  HCT  25.9*   < > 28.4*   < > 26.5* 25.0* 25.1* 25.1*  MCV 80.2   < > 82.3  --  80.1 79.4* 78.4* 80.4  PLT 192   < > 248  --  162 160 172 174   < > = values in this interval not displayed.   Cardiac Enzymes: No results for input(s): CKTOTAL, CKMB, CKMBINDEX, TROPONINI in the last 168 hours. CBG: Recent Labs  Lab 11/14/20 1634 11/14/20 1948 11/14/20 2314 11/15/20 0411 11/15/20 0820  GLUCAP 192* 157* 155* 146* 154*    Iron Studies: No results for input(s): IRON, TIBC, TRANSFERRIN, FERRITIN in the last 72 hours. Studies/Results: DG Chest Port 1 View  Result Date: 11/14/2020 CLINICAL DATA:  Acute respiratory failure. EXAM: PORTABLE CHEST 1 VIEW COMPARISON:  11/13/2020 FINDINGS: Cardiomegaly, tracheostomy tube, RIGHT IJ central venous catheter with tip overlying the mid SVC, RIGHT PICC line with tip overlying the SUPERIOR cavoatrial junction and LEFT-sided pacemaker/AICD again noted. Bilateral airspace opacities are stable to minimally improved. No large pleural effusion or pneumothorax identified. No other significant change noted. IMPRESSION: Stable to  minimally improved bilateral airspace opacities. No other significant change. Electronically Signed   By: Margarette Canada M.D.   On: 11/14/2020 08:17    Medications: Infusions: .  prismasol BGK 4/2.5 1,000 mL/hr at 11/15/20 0828  .  prismasol BGK 4/2.5 500 mL/hr at 11/15/20 0829  . sodium chloride 10 mL/hr at 11/15/20 0800  . sodium chloride Stopped (11/08/20 1119)  . sodium chloride    . sodium chloride 0 mL (11/10/20 0701)  . sodium chloride    . anidulafungin 100 mg (11/14/20 0928)  . fentaNYL infusion INTRAVENOUS Stopped (11/15/20 0737)  . norepinephrine (LEVOPHED) Adult infusion Stopped (11/15/20 0155)  . phenylephrine (NEO-SYNEPHRINE) Adult infusion 110 mcg/min (11/15/20 0800)  . piperacillin-tazobactam 3.375 g (11/15/20 0844)  . potassium chloride    . prismasol BGK 4/2.5 1,500 mL/hr at 11/15/20 0819  . promethazine (PHENERGAN) injection (IM or IVPB) Stopped (11/10/20 1538)  . TPN ADULT (ION) 150 mL/hr at 11/15/20 0800  . TPN ADULT (ION)    . vasopressin 0.03 Units/min (11/15/20 0800)    Scheduled Medications: . chlorhexidine gluconate (MEDLINE KIT)  15 mL Mouth Rinse BID  . Chlorhexidine Gluconate Cloth  6 each Topical Q0600  . heparin injection (subcutaneous)  5,000 Units Subcutaneous Q8H  . insulin aspart  0-15 Units Subcutaneous Q4H  . lidocaine  5 mL Intradermal Once  . mouth rinse  15 mL Mouth Rinse 10 times per day  . pantoprazole (PROTONIX) IV  40 mg Intravenous BID  . sodium chloride flush  10-40 mL Intracatheter Q12H  . sodium chloride flush  5 mL Intracatheter Q8H    have reviewed scheduled and prn medications.  Physical Exam: General: Lying in bed, on vent Heart:RRR, s1s2 nl Lungs: Clear anteriorly Abdomen:soft, non-distended Extremities: Trace LE edema Dialysis Access: IJ HD temp catheter in place.  [place] 11/15/2020,9:31 AM  LOS: 54 days

## 2020-11-15 NOTE — Progress Notes (Signed)
[name], MRN:  [id number], DOB:  [birth year], LOS: 69 ADMISSION DATE:  09/27/2020, CONSULTATION DATE:  11/15/20 REFERRING MD:  Anesthesia, CHIEF COMPLAINT:  Whipple, post-op shock   Brief History:  50 y.o. M with PMH of neuroendocrine tumor and underwent Whipple procedure 3/14 with intra-operative SMV laceration and repair by vascular with bovine pericardial patch.  Pt had worsening shock overnight and required three pressors despite massive transfusion protocol (received 150 blood products).  He was taken back to the OR for washout on 3/15 and returned to the ICU intubated.  Taken back 3/17 again found to have small bowel necrosis s/p resection.  Went back for ex-lap on 3/19, noted to have necrosis of nearly entirety of bowel, no bowel resected.  Fascia closed.  Case was discussed with family and [facility] transplant surgery for consideration of eventual small bowel transplant.  Electively returned to the OR on 3/21 for resection of remaining necrotic small bowel removed, G-tube placed, JP drains in place (2). Tracheostomy ws performed by Dr. Bobbye Morton on 4/8.  Started CRRT for AKI on 4/9.  See details below regarding complex hospitalization.   Past Medical History:  Neuroendocrine tumor of pancreas, resected 09/30/2020, negative surgical margins, lymph nodes negative Hypertension Chronic Systolic heart failure LVEF 35% in 2011, 2022 LVEF 50%, RVSP normal, LA dilated Non-ischemic cardioyopathy Hyperlipidemia V-fib with arrest, AICD placed in [facility] Events:  3/14: Admit to Surgery, to OR for whipple, SMV injury and repair, massive transfusion protocol overnight  3/15: Back to OR for washout, x2. IR for arteriogram. No major bleed source identified in OR, or arterial bleed w IR. Robust product resuscitation >75 products (+ TXA + novoseven) 3/16: off pressors, add'l 39 products overnight. Slowing resuscitation this morning with hemodynamic improvements and slowing drain  output; OR x 2 - washout, biliary t tube, wound vac -- washout, wound vac, IR for arteriogram -- no arterial bleed for embolization 3/17:  Aggressive balanced transfusion continue overnight with marked hemodynamic improvement since yesterday evening. Off pressors x several hours, Wound vac output significantly slowing (from 589ml q15-42min to 528ml q1hr+) and output is much thinner, Thin bloody secretions from mouth approx 244ml, Decreased RR from 22 to 18, Unable to tolerate NE- severe bradycardia 3/19 taken back to OR. Small bowel noted to be almost completely necrosed. Patient was closed with plans for goals of car discussion.  3/20 [place] family requests full scope of treatment. Primary team discussed with [facility] the possibility of transfer for small bowel transplant. [facility] felt as though transfer would not change outcome. 3/21 Ongoing discussion with family and [facility]. Patient may be a candidate for transplant if he can stabilize post a small bowel resection. He was taken back to the OR and small bowel was resected.  3/23 weaning pressors. Versed off. Remains encephalopathic 3/24 off pressors. Low dose dilaudid gtt -- only weakly grimaces to pain. Changing sedation to precedex + PRN fentanyl. Long discussion with 2 brothers regarding clinical case -- tried to clarify that while the term "stable" has been used, in this instance is meaning that he has not declined from previous shift but is in fact still critically ill with multisystem organ dysfunction.  3/25 Cr and BUN increased. Off sedation  3/26 Pressors off, CT head benign, BUN/Cr continue to creep up 3/27 Tachycardia/HTN overnight improved with fentanyl gtt 3/28 PCCM sign off. Trauma to take over vent/CCM needs.  3/30 extubated 4/2 desaturated overnight to the 30s improved with BVM and NTS.  4/5 poor airway protection. PCCM consulted for re-intubation.  4/6 CT a/p Large RLL opacity, smaller LLL opacity. Post op changes. 5.4cm fluid collection in  posterior pelvis, concerning for abscess. Started on Zosyn  4/7 got transfused overnight and then diuresed. Worse renal function 4/8 Tracheostomy performed by general surgery 4/9 transfused again. HD line inserted and started on CRRT 4/10 started on trach collar trials. 4/12 off CRRT and transitioned to IHD. Surgery placed abdominal drain with minimal output-- during procedure seemed c/w hematoma however since grew out klebsiella  4/13, 4/14 Tolerated dialysis 4/15 Placed on vent overnight for rest 4/16 Tolerating trach collar 4/17 got HD. Trach downsized to 6 cuffless, trach dislodged overnight, replaced with #6 cuffless shiley.  4/18 afebrile, trach collar + PMV. Up to chair with PT, PCCM signed off 4/21 decannulated; called back for hypotension, Afib/RVR, increased output from his 5 drains and wound vac.Transfused 2 units, re-cannulated, on Levo and Neo.  ICD fired x2, cardiology consulted 4/24 Continues to tolerate trach collar, 5L 28% 4/29 fever overnight, no complaints, remains on tracheostomy collar 4/30-5/1 fever continued, WBC unchanged, blood cultures sent, zosyn restarted 5/3 worsening shock, hypoxemia due to HCAP, back on ventilator, CRRT started again, made DNR, turned off ICD   Consults:  PCCM VVS  IR  Palliative Cardiology Nephrology  Procedures:  PICC 3/25 > JP x 2  Biliary drain Pancreatic stent drain ETT 2/24>>3/13; 3/15 >> 3/29;  4/5>>4/8 Trach 4/8> L lateral abdominal drain 4/12>  Significant Diagnostic Tests:  See radiology tab Most recent significant imaging:  4/12 CT abd/pelvis>>  Unchanged ill-defined fluid collections within the left side of the root of the abdominal mesentery, the ventral aspect of the abdominal mesentery, the subcapsular aspect of the right lobe of the liver and the pelvic cul-de-sac all of which are incompletely evaluated on this noncontrast examination though may represent areas of evolving hematomas giving imaging stability for the  past week. Redemonstrated ill-defined stranding surrounding the remaining portions of the pancreas without definable/drainable fluid collection on this noncontrast examination.  Micro Data:  See micro tab Most recent significant findings:   4/5 trach asp >> Klebsiella pneumoniae 4/12 Pelvic abscess >> ampicillin resistant Klebsiella pneumoniae 4/12 BCx> no growth   5/1 blood cultures 5/3 resp culture > negative  Antimicrobials:  Zosyn 3/21 > 3/29; 4/5 >4/19; 5/1 >  Eraxis 3/23> 3/29, 4/12>4/19 5/3 > Vanco 5/3 > 5/5   Interim History / Subjective:   Overnight Atrial flutter with HR in 120s. Switched to phenylephrine and norepinephrine titrated off. Started on digoxin x 1 dose.   Objective   Blood pressure (!) 99/46, pulse 76, temperature 98.3 F (36.8 C), temperature source Oral, resp. rate (!) 25, height 6\' 2"  (1.88 m), weight 111 kg, SpO2 99 %. CVP:  [8 mmHg-31 mmHg] 15 mmHg  Vent Mode: PSV;CPAP FiO2 (%):  [40 %] 40 % Set Rate:  [24 bmp] 24 bmp Vt Set:  [650 mL] 650 mL PEEP:  [5 cmH20] 5 cmH20 Pressure Support:  [12 cmH20] 12 cmH20 Plateau Pressure:  [28 cmH20] 28 cmH20   Intake/Output Summary (Last 24 hours) at 11/15/2020 1056 Last data filed at 11/15/2020 1000 Gross per 24 hour  Intake 5330.65 ml  Output 6337 ml  Net -1006.35 ml   Filed Weights   11/12/20 0500 11/14/20 0500 11/15/20 0400  Weight: 109.9 kg 106.4 kg 111 kg   Physical Exam: General:  Middle aged male on vent via trach Neuro: awake, alert, nods head to questions HEENT:  [place]/AT, No JVD noted, PERRL Cardiovascular:  RRR, no MRG Lungs:  Clear bilateral breath sounds Abdomen: soft, mildline wound vac, multiple drains present  Personally reviewed labs and imaging Na 135, K 3.7, Phos 3.0, Mg 2.4, [place] 1.3  Resolved Hospital Problem list   Hemorrhagic shock Septic shock  Klebsiella pneumoniae pneumonia Acute hypoxemic respiratory failure requiring mechanical ventilation  Assessment & Plan:    Acute respiratory failure with hypoxia > worsened due to HCAP S/p Tracheostomy  Full mechanical vent support Continue PS trials as tolerated. Not appropriate for decannulation VAP prevention SpO2 goal > 90%  PAF/Atrial Flutter Systolic heart failure  ICD in place, turned off defib function on 5/3 Tele Amiodarone OK if VT Currently back in NSR after 1 dose of digoxin and switching from norepinephrine to phenylephrine Amiodarone can still be clinically useful in AF/Aflutter despite prolonged QRS  Circulatory vs Septic Shock Off stress steroids as of 5/5 Vasopressin and phenylephrine to continue. Would aim for goal SBP over 90 to titrate pressors rather than MAP 65.   HCAP > still no organism identified Multiple abdominal abscesses Possible fungemia Stop eraxis after today given no cultures positive for candida. Received 7 days. Low threshold to resume for clinical worsening given on TPN as well Hoping WBC improves after stopping steroids.  Continue zosyn day 7 for now. Consider 10 day course for intra-abdominal infection and hcap.   S/p near complete small bowel resection: not a candidate for small bowel transplant currently TPN per pharmacy and CCS  AKI now on HD Severe metabolic acidosis: most likely due to hypoperfusion from septic shock Continue CRRT    Daily Goals Checklist  Per primary Code Status: DNR. Global: remains critically ill. Continue vasopressors. ICD switched back on due to family request on 5/6. I still think the likelihood he would be able to achieve ambulatory status for small bowel evaluation is quite low. We will continue to follow for critical care needs.   The patient is critically ill due to respiratory failure requiring invasive mechanical ventilation, circulatory vs septic shock, AKI on CRRT .  Critical care was necessary to treat or prevent imminent or life-threatening deterioration.  Critical care was time spent personally by me on the following  activities: development of treatment plan with patient and/or surrogate as well as nursing, discussions with consultants, evaluation of patient's response to treatment, examination of patient, obtaining history from patient or surrogate, ordering and performing treatments and interventions, ordering and review of laboratory studies, ordering and review of radiographic studies, pulse oximetry, re-evaluation of patient's condition and participation in multidisciplinary rounds.   Critical Care Time devoted to patient care services described in this note is 31 minutes. This time reflects time of care of this [place] . This critical care time does not reflect separately billable procedures or procedure time, teaching time or supervisory time of PA/NP/Med student/Med Resident etc but could involve care discussion time.       Spero Geralds [place] [facility] 11/15/2020 10:56 AM  Pager: see AMION  If no response to pager , please call critical care on call (see AMION) until 7pm After 7:00 pm call Elink

## 2020-11-16 DIAGNOSIS — R579 Shock, unspecified: Secondary | ICD-10-CM | POA: Diagnosis not present

## 2020-11-16 DIAGNOSIS — D3A8 Other benign neuroendocrine tumors: Secondary | ICD-10-CM | POA: Diagnosis not present

## 2020-11-16 DIAGNOSIS — J9601 Acute respiratory failure with hypoxia: Secondary | ICD-10-CM | POA: Diagnosis not present

## 2020-11-16 DIAGNOSIS — N179 Acute kidney failure, unspecified: Secondary | ICD-10-CM | POA: Diagnosis not present

## 2020-11-16 LAB — GLUCOSE, CAPILLARY
Glucose-Capillary: 112 mg/dL — ABNORMAL HIGH (ref 70–99)
Glucose-Capillary: 125 mg/dL — ABNORMAL HIGH (ref 70–99)
Glucose-Capillary: 128 mg/dL — ABNORMAL HIGH (ref 70–99)
Glucose-Capillary: 130 mg/dL — ABNORMAL HIGH (ref 70–99)
Glucose-Capillary: 145 mg/dL — ABNORMAL HIGH (ref 70–99)
Glucose-Capillary: 155 mg/dL — ABNORMAL HIGH (ref 70–99)

## 2020-11-16 LAB — RENAL FUNCTION PANEL
Albumin: 1.2 g/dL — ABNORMAL LOW (ref 3.5–5.0)
Albumin: 1.3 g/dL — ABNORMAL LOW (ref 3.5–5.0)
Anion gap: 6 (ref 5–15)
Anion gap: 6 (ref 5–15)
BUN: 38 mg/dL — ABNORMAL HIGH (ref 6–20)
BUN: 37 mg/dL — ABNORMAL HIGH (ref 6–20)
CO2: 25 mmol/L (ref 22–32)
CO2: 24 mmol/L (ref 22–32)
Calcium: 8.1 mg/dL — ABNORMAL LOW (ref 8.9–10.3)
Calcium: 8.2 mg/dL — ABNORMAL LOW (ref 8.9–10.3)
Chloride: 104 mmol/L (ref 98–111)
Chloride: 104 mmol/L (ref 98–111)
Creatinine, Ser: 1.5 mg/dL — ABNORMAL HIGH (ref 0.61–1.24)
Creatinine, Ser: 1.33 mg/dL — ABNORMAL HIGH (ref 0.61–1.24)
GFR, Estimated: 57 mL/min — ABNORMAL LOW (ref >60)
GFR, Estimated: >60 mL/min (ref >60)
Glucose, Bld: 141 mg/dL — ABNORMAL HIGH (ref 70–99)
Glucose, Bld: 161 mg/dL — ABNORMAL HIGH (ref 70–99)
Phosphorus: 2.3 mg/dL — ABNORMAL LOW (ref 2.5–4.6)
Phosphorus: 4 mg/dL (ref 2.5–4.6)
Potassium: 3.8 mmol/L (ref 3.5–5.1)
Potassium: 4.2 mmol/L (ref 3.5–5.1)
Sodium: 135 mmol/L (ref 135–145)
Sodium: 134 mmol/L — ABNORMAL LOW (ref 135–145)

## 2020-11-16 LAB — CBC
HCT: 24.7 % — ABNORMAL LOW (ref 39.0–52.0)
Hemoglobin: 7.5 g/dL — ABNORMAL LOW (ref 13.0–17.0)
MCH: 25.5 pg — ABNORMAL LOW (ref 26.0–34.0)
MCHC: 30.4 g/dL (ref 30.0–36.0)
MCV: 84 fL (ref 80.0–100.0)
Platelets: 144 10*3/uL — ABNORMAL LOW (ref 150–400)
RBC: 2.94 MIL/uL — ABNORMAL LOW (ref 4.22–5.81)
RDW: 22.5 % — ABNORMAL HIGH (ref 11.5–15.5)
WBC: 19.8 10*3/uL — ABNORMAL HIGH (ref 4.0–10.5)
nRBC: 3.9 % — ABNORMAL HIGH (ref 0.0–0.2)

## 2020-11-16 LAB — MAGNESIUM: Magnesium: 2.5 mg/dL — ABNORMAL HIGH (ref 1.7–2.4)

## 2020-11-16 MED ORDER — POTASSIUM PHOSPHATES 15 MMOLE/5ML IV SOLN
15.0000 mmol | Freq: Once | INTRAVENOUS | Status: AC
  Administered 2020-11-16: 15 mmol via INTRAVENOUS
  Filled 2020-11-16: qty 5

## 2020-11-16 MED ORDER — ZINC CHLORIDE 1 MG/ML IV SOLN
INTRAVENOUS | Status: AC
  Filled 2020-11-16: qty 2052

## 2020-11-16 NOTE — Progress Notes (Signed)
Pt was placed back on full vent support due to complaints of SOB. RN at bedside.

## 2020-11-16 NOTE — Progress Notes (Signed)
NAMERYER ASATO, MRN:  [id number], DOB:  05-22-1971, LOS: 68 ADMISSION DATE:  09/11/2020, CONSULTATION DATE:  11/16/20 REFERRING MD:  Anesthesia, CHIEF COMPLAINT:  Whipple, post-op shock   Brief History:  50 y.o. M with PMH of neuroendocrine tumor and underwent Whipple procedure 3/14 with intra-operative SMV laceration and repair by vascular with bovine pericardial patch.  Pt had worsening shock overnight and required three pressors despite massive transfusion protocol (received 150 blood products).  He was taken back to the OR for washout on 3/15 and returned to the ICU intubated.  Taken back 3/17 again found to have small bowel necrosis s/p resection.  Went back for ex-lap on 3/19, noted to have necrosis of nearly entirety of bowel, no bowel resected.  Fascia closed.  Case was discussed with family and [facility] transplant surgery for consideration of eventual small bowel transplant.  Electively returned to the OR on 3/21 for resection of remaining necrotic small bowel removed, G-tube placed, JP drains in place (2). Tracheostomy ws performed by Dr. Bobbye Morton on 4/8.  Started CRRT for AKI on 4/9.  See details below regarding complex hospitalization.   Past Medical History:  Neuroendocrine tumor of pancreas, resected 09/11/2020, negative surgical margins, lymph nodes negative Hypertension Chronic Systolic heart failure LVEF 35% in 2011, 2022 LVEF 50%, RVSP normal, LA dilated Non-ischemic cardioyopathy Hyperlipidemia V-fib with arrest, AICD placed in [place] [facility] Events:  3/14: Admit to Surgery, to OR for whipple, SMV injury and repair, massive transfusion protocol overnight  3/15: Back to OR for washout, x2. IR for arteriogram. No major bleed source identified in OR, or arterial bleed w IR. Robust product resuscitation >75 products (+ TXA + novoseven) 3/16: off pressors, add'l 39 products overnight. Slowing resuscitation this morning with hemodynamic improvements and slowing drain  output; OR x 2 - washout, biliary t tube, wound vac -- washout, wound vac, IR for arteriogram -- no arterial bleed for embolization 3/17:  Aggressive balanced transfusion continue overnight with marked hemodynamic improvement since yesterday evening. Off pressors x several hours, Wound vac output significantly slowing (from 522ml q15-67min to 55ml q1hr+) and output is much thinner, Thin bloody secretions from mouth approx 251ml, Decreased RR from 22 to 18, Unable to tolerate NE- severe bradycardia 3/19 taken back to OR. Small bowel noted to be almost completely necrosed. Patient was closed with plans for goals of car discussion.  3/20 [place] family requests full scope of treatment. Primary team discussed with [facility] the possibility of transfer for small bowel transplant. [facility] felt as though transfer would not change outcome. 3/21 Ongoing discussion with family and [facility]. Patient may be a candidate for transplant if he can stabilize post a small bowel resection. He was taken back to the OR and small bowel was resected.  3/23 weaning pressors. Versed off. Remains encephalopathic 3/24 off pressors. Low dose dilaudid gtt -- only weakly grimaces to pain. Changing sedation to precedex + PRN fentanyl. Long discussion with 2 brothers regarding clinical case -- tried to clarify that while the term "stable" has been used, in this instance is meaning that he has not declined from previous shift but is in fact still critically ill with multisystem organ dysfunction.  3/25 Cr and BUN increased. Off sedation  3/26 Pressors off, CT head benign, BUN/Cr continue to creep up 3/27 Tachycardia/HTN overnight improved with fentanyl gtt 3/28 PCCM sign off. Trauma to take over vent/CCM needs.  3/30 extubated 4/2 desaturated overnight to the 30s improved with BVM and NTS.  4/5 poor airway protection. PCCM consulted for re-intubation.  4/6 CT a/p Large RLL opacity, smaller LLL opacity. Post op changes. 5.4cm fluid collection in  posterior pelvis, concerning for abscess. Started on Zosyn  4/7 got transfused overnight and then diuresed. Worse renal function 4/8 Tracheostomy performed by general surgery 4/9 transfused again. HD line inserted and started on CRRT 4/10 started on trach collar trials. 4/12 off CRRT and transitioned to IHD. Surgery placed abdominal drain with minimal output-- during procedure seemed c/w hematoma however since grew out klebsiella  4/13, 4/14 Tolerated dialysis 4/15 Placed on vent overnight for rest 4/16 Tolerating trach collar 4/17 got HD. Trach downsized to 6 cuffless, trach dislodged overnight, replaced with #6 cuffless shiley.  4/18 afebrile, trach collar + PMV. Up to chair with PT, PCCM signed off 4/21 decannulated; called back for hypotension, Afib/RVR, increased output from his 5 drains and wound vac.Transfused 2 units, re-cannulated, on Levo and Neo.  ICD fired x2, cardiology consulted 4/24 Continues to tolerate trach collar, 5L 28% 4/29 fever overnight, no complaints, remains on tracheostomy collar 4/30-5/1 fever continued, WBC unchanged, blood cultures sent, zosyn restarted 5/3 worsening shock, hypoxemia due to HCAP, back on ventilator, CRRT started again, made DNR, turned off ICD   Consults:  PCCM VVS  IR  Palliative Cardiology Nephrology  Procedures:  PICC 3/25 > JP x 2  Biliary drain Pancreatic stent drain ETT 2/24>>3/13; 3/15 >> 3/29;  4/5>>4/8 Trach 4/8> L lateral abdominal drain 4/12>  Significant Diagnostic Tests:  See radiology tab Most recent significant imaging:  4/12 CT abd/pelvis>>  Unchanged ill-defined fluid collections within the left side of the root of the abdominal mesentery, the ventral aspect of the abdominal mesentery, the subcapsular aspect of the right lobe of the liver and the pelvic cul-de-sac all of which are incompletely evaluated on this noncontrast examination though may represent areas of evolving hematomas giving imaging stability for the  past week. Redemonstrated ill-defined stranding surrounding the remaining portions of the pancreas without definable/drainable fluid collection on this noncontrast examination.  Micro Data:  See micro tab Most recent significant findings:   4/5 trach asp >> Klebsiella pneumoniae 4/12 Pelvic abscess >> ampicillin resistant Klebsiella pneumoniae 4/12 BCx> no growth   5/1 blood cultures 5/3 resp culture > negative  Antimicrobials:  Zosyn 3/21 > 3/29; 4/5 >4/19; 5/1 >  Eraxis 3/23> 3/29, 4/12>4/19 5/3 > Vanco 5/3 > 5/5   Interim History / Subjective:   No overnight issues. This morning asking to be repositioned.  Objective   Blood pressure 107/70, pulse 63, temperature 98 F (36.7 C), temperature source Oral, resp. rate 17, height 6\' 2"  (1.88 m), weight 109.3 kg, SpO2 100 %. CVP:  [7 mmHg-19 mmHg] 18 mmHg  Vent Mode: PSV;CPAP FiO2 (%):  [40 %] 40 % Set Rate:  [24 bmp] 24 bmp Vt Set:  [650 mL] 650 mL PEEP:  [5 cmH20] 5 cmH20 Pressure Support:  [12 cmH20] 12 cmH20 Plateau Pressure:  [27 cmH20] 27 cmH20   Intake/Output Summary (Last 24 hours) at 11/16/2020 0908 Last data filed at 11/16/2020 0800 Gross per 24 hour  Intake 4532.61 ml  Output 6100 ml  Net -1567.39 ml   Filed Weights   11/14/20 0500 11/15/20 0400 11/16/20 0500  Weight: 106.4 kg 111 kg 109.3 kg   Physical Exam: General: chronically critically ill patient Trach to vent Neuro: awake, alert, follows commands, diffusely weak Cardiovascular:  RRR no mrg Lungs:  Clear bilateral breath sounds Abdomen: soft, wound vac present, multiple  drains  Personally reviewed labs and imaging Na 135, K 3.8, Cr 1.5, WBC 19.8, Hgb 7.5  Resolved Hospital Problem list   Hemorrhagic shock Septic shock  Klebsiella pneumoniae pneumonia Acute hypoxemic respiratory failure requiring mechanical ventilation  Assessment & Plan:   Acute respiratory failure with hypoxia > worsened due to HCAP S/p Tracheostomy  Full mechanical vent  support Continue PS trials as tolerated. Not appropriate for decannulation VAP prevention SpO2 goal > 90%  PAF/Atrial Flutter Systolic heart failure  ICD in place Tele Amiodarone OK if VT Back in NSR  Circulatory vs Septic Shock Off stress steroids as of 5/5 Vasopressin and phenylephrine to continue. Would aim for goal SBP over 90 to titrate pressors rather than MAP 65.   HCAP > still no organism identified Multiple abdominal abscesses Possible fungemia Stop eraxis after today given no cultures positive for candida. Received 7 days. Low threshold to resume for clinical worsening given on TPN as well Hoping WBC improves after stopping steroids.  Continue zosyn day 8. Consider 10 day course for intra-abdominal infection and hcap.   S/p near complete small bowel resection: not a candidate for small bowel transplant currently TPN per pharmacy and CCS  AKI now on HD Severe metabolic acidosis: most likely due to hypoperfusion from septic shock Continue CRRT    Daily Goals Checklist  Per primary Code Status: DNR. Global: remains critically ill. Continue vasopressors. ICD switched back on due to family request on 5/6. I still think the likelihood he would be able to achieve ambulatory status for small bowel evaluation is quite low. We will continue to follow for critical care needs.   The patient is critically ill due to respiratory failure requiring invasive mechanical ventilation, circulatory vs septic shock, AKI on CRRT .  Critical care was necessary to treat or prevent imminent or life-threatening deterioration.  Critical care was time spent personally by me on the following activities: development of treatment plan with patient and/or surrogate as well as nursing, discussions with consultants, evaluation of patient's response to treatment, examination of patient, obtaining history from patient or surrogate, ordering and performing treatments and interventions, ordering and review of  laboratory studies, ordering and review of radiographic studies, pulse oximetry, re-evaluation of patient's condition and participation in multidisciplinary rounds.   Critical Care Time devoted to patient care services described in this note is 30 minutes. This time reflects time of care of this [place] . This critical care time does not reflect separately billable procedures or procedure time, teaching time or supervisory time of PA/NP/Med student/Med Resident etc but could involve care discussion time.       Spero Geralds [facility] 11/16/2020 9:08 AM  Pager: see AMION  If no response to pager , please call critical care on call (see AMION) until 7pm After 7:00 pm call Elink

## 2020-11-16 NOTE — Progress Notes (Signed)
[facility] NEPHROLOGY PROGRESS NOTE  Assessment/ Plan: Pt is a 50 y.o. yo male with neuroendocrine tumor status post Whipple procedure right colectomy and SMV repair, developed AKI on dialysis.  Extensive dead small bowel requiring resection, TPN dependent  #AKI RRT dependent, prolonged, since 4/9. ATN.  No evidence of recovery and sig decompensation 5/3 req back on CRRT.  All 4K, slight neg UF, no anticoagulation.  Grim prognosis.  Palliative working with with family. Replete P if < 3.    #Acute respiratory failure required mechanical ventilation. Back on Vent, per CCM. Liekly HCAP; slowly improving  #Neuroendocrine tumor status post Whipple procedure with extensive small bowel necrosis status post resection.  TPN dependent  #Severe protein calorie malnutrition, hypoalbuminemia; TPN dependent  #Anemia: Hemoglobin stable.  Transfuse PRBC as needed.  Per CCM and CCS  #Hypotension: Remains on 2 pressors  # Hyperkalemia, resolved  # metabolic and resp acidosis, as above.    Subjective:   No interval events  K3.8, phosphorus 2.3, repleted   Remains on 2 pressors   All 4K bath  -1.7 L yesterday, anuric  Objective Vital signs in last 24 hours: Vitals:   11/16/20 0600 11/16/20 0700 11/16/20 0800 11/16/20 0839  BP: (!) 100/52 (!) 102/59 107/70   Pulse: 61 (!) 59 62 63  Resp: (!) 24  (!) 24 17  Temp:   98.2 F (36.8 C)   TempSrc:   Oral   SpO2: 100% 100% 100% 100%  Weight:      Height:       Weight change: -1.7 kg  Intake/Output Summary (Last 24 hours) at 11/16/2020 1124 Last data filed at 11/16/2020 1025 Gross per 24 hour  Intake 4488.62 ml  Output 5984 ml  Net -1495.38 ml       Labs: Basic Metabolic Panel: Recent Labs  Lab 11/15/20 0455 11/15/20 1600 11/16/20 0442  NA 135 135 135  K 3.7 4.2 3.8  CL 102 102 104  CO2 _0 GLUCOSE 154* 147* 141*  BUN 42* 40* 38*  CREATININE 1.57* 1.49* 1.50*  CALCIUM 8.2* 8.0* 8.1*  PHOS 3.0 3.4 2.3*    Liver Function Tests: Recent Labs  Lab 11/11/20 1448 11/12/20 0413 11/13/20 0430 11/13/20 1618 11/14/20 0508 11/14/20 1613 11/15/20 0455 11/15/20 1600 11/16/20 0442  AST 178*  --  161*  --  105*  --   --   --   --   ALT 156*  --  227*  --  189*  --   --   --   --   ALKPHOS 90  --  94  --  101  --   --   --   --   BILITOT 5.7*  --  6.3*  --  6.5*  --   --   --   --   PROT 8.2*  --  6.7  --  6.4*  --   --   --   --   ALBUMIN 1.5*  1.5*   < > 1.3*   < > 1.2*   < > 1.3* 1.3* 1.2*   < > = values in this interval not displayed.   No results for input(s): LIPASE, AMYLASE in the last 168 hours. No results for input(s): AMMONIA in the last 168 hours. CBC: Recent Labs  Lab 11/10/20 0501 11/11/20 0300 11/13/20 0430 11/13/20 0844 11/14/20 0508 11/15/20 0455 11/16/20 0442  WBC 8.6   < > 13.7* 13.8* 17.8* 20.4* 19.8*  NEUTROABS 6.3  --  12.5* 11.7*  --   --   --   HGB 8.5*   < > 8.4* 8.1* 8.3* 8.1* 7.5*  HCT 25.9*   < > 26.5* 25.0* 25.1* 25.1* 24.7*  MCV 80.2   < > 80.1 79.4* 78.4* 80.4 84.0  PLT 192   < > 162 160 172 174 144*   < > = values in this interval not displayed.   Cardiac Enzymes: No results for input(s): CKTOTAL, CKMB, CKMBINDEX, TROPONINI in the last 168 hours. CBG: Recent Labs  Lab 11/15/20 1637 11/15/20 1943 11/15/20 2331 11/16/20 0345 11/16/20 0820  GLUCAP 140* 135* 131* 130* 128*    Iron Studies: No results for input(s): IRON, TIBC, TRANSFERRIN, FERRITIN in the last 72 hours. Studies/Results: No results found.  Medications: Infusions: .  prismasol BGK 4/2.5 1,000 mL/hr at 11/16/20 1021  .  prismasol BGK 4/2.5 500 mL/hr at 11/16/20 0508  . sodium chloride Stopped (11/15/20 1302)  . sodium chloride Stopped (11/08/20 1119)  . sodium chloride    . sodium chloride 0 mL (11/10/20 0701)  . sodium chloride    . phenylephrine (NEO-SYNEPHRINE) Adult infusion 100 mcg/min (11/16/20 1000)  . piperacillin-tazobactam Stopped (11/16/20 1610)  . potassium  PHOSPHATE IVPB (in mmol) 15 mmol (11/16/20 1034)  . prismasol BGK 4/2.5 1,500 mL/hr at 11/16/20 0805  . promethazine (PHENERGAN) injection (IM or IVPB) Stopped (11/15/20 2349)  . TPN ADULT (ION) 150 mL/hr at 11/16/20 1000  . TPN ADULT (ION)    . vasopressin 0.03 Units/min (11/16/20 1000)    Scheduled Medications: . chlorhexidine gluconate (MEDLINE KIT)  15 mL Mouth Rinse BID  . Chlorhexidine Gluconate Cloth  6 each Topical Q0600  . heparin injection (subcutaneous)  5,000 Units Subcutaneous Q8H  . insulin aspart  0-15 Units Subcutaneous Q4H  . lidocaine  5 mL Intradermal Once  . mouth rinse  15 mL Mouth Rinse 10 times per day  . pantoprazole (PROTONIX) IV  40 mg Intravenous BID  . sodium chloride flush  10-40 mL Intracatheter Q12H  . sodium chloride flush  5 mL Intracatheter Q8H    have reviewed scheduled and prn medications.  Physical Exam: General: Lying in bed, on vent, awake and interactive Heart:RRR, s1s2 nl Lungs: Clear anteriorly Abdomen:soft, non-distended Extremities: Trace LE edema Dialysis Access: IJ HD temp catheter in place.  Erika Hussar B Emmarose Klinke 11/16/2020,11:24 AM  LOS: 55 days

## 2020-11-16 NOTE — Progress Notes (Signed)
Pt placed on PSV 12/5 per wean protocol. Pt is tolerating well at this time. RT to continue to monitor.

## 2020-11-16 NOTE — Progress Notes (Signed)
PHARMACY - TOTAL PARENTERAL NUTRITION CONSULT NOTE  Indication:  Short bowel syndrome  Patient Measurements: (updated) Height: 6\' 2"  (188 cm) Weight: 97.4 kg (214 lb 11.7 oz) IBW/kg (Calculated) : 82.2 TPN AdjBW (KG): 95.3 Body mass index is 27.57 kg/m.  Assessment:  32 YOM with history of mass adjacent to head of the pancreas in 2021. 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  Developed cholecystitis in Feb 2022 and had percutaneous cholecystectomy tube placement.  Presented this admission for neuroendocrine tumor resection with Whipple procedure, also had right total colectomy, colostomy and cholecystectomy on 09/29/2020. SMV was lacerated during procedure and repaired by bovine pericardial patch. Developed hemorrhagic shock with bile leak post-op and underwent placement of biliary T tube with abdominal washout on 3/15 PM. Antimicrobial therapies discontinued 4/20. Pharmacy consulted to manage TPN.   Glucose / Insulin: no hx DM, A1c 5.2%. CBGs <180. Stress steroids d/c'd again 5/5. Utilized 13 units mSSI in last 24 hours + 75 units in TPN Electrolytes: Transitioned back to CRRT on 5/3 - K 3.8 on 4K CRRT bags (s/p K runs x 3 yesterday), Phos down to 2.3, corrected Ca slightly high ~10.4, iCa 1.02 (5/4), Mag slightly high 2.5 Renal: CRRT 4/9-4/11 > iHD > back to CRRT on 5/3  Hepatic: LFTs mildly elevated - stable, Tbili up to 6.5, TG back up to 193. Albumin 1.3, prealbumin up to 17.8 TG peaked at 740 (3/26) - last value 153 (5/2)  Intake / Output; MIVF: none to minimal UOP, drain output (5 total): 925 mL, net +11.8L GI Imaging: 4/6 CT - pelvic fluid collection concerning for abscess 4/12 CT - no new fluid collections 4/28 CT - new fluid+gas collection in RLQ GI Surgeries / Procedures: 3/14 Whipple procedure with R hemicolectomy and end ileostomy, cholecystectomy 3/15 washout, VAC placement 3/17 washout, VAC replacement.  Necrosis of entire small bowel except for  the PB limb and proximal 40-50cm of jejunum 3/19 re-opening laparotomy, abd closure.  Necrotic SB. 3/21 SB resection, take down of choledochojejunal anastomosis and pancreaticojejunal anastomosis, take down of duodenojejunal anastomosis, placement of externalized biliary drain / Stamm gastrostomy tube, IA washout and placement of intraperitoneal drains 3/30 extubation> 4/8 trach 4/12 left lateral abdominal drain placed by IR  Central access: R IJ CVC placed 3/15; changed to PICC placed 10/03/20 TPN start date: 09/20/2020  Nutritional Goals (RD recommendation on 5/5): 2800-3000 kCal, 185-210g AA, fluid >2L/day  Lipid days (MWF): 3340 kcal with 205g protein, 540g CHO, 68g SMOF lipid NON-lipid days (TThSaSun): 2657kcal with 205g protein, 540g CHO  Current Nutrition:  TPN; NPO  Plan: - Continue TPN at goal rate 150 mL/hr at 1800 - Continue 19g/L SMOF lipids on MWF to prevent EFAD due to elevated TG - TPN will provide weekly average of 2949 kCal and 205g AA per day, meeting 100% of needs - Electrolytes in TPN: Na 120mEq/L, K 15 mEq/L, decrease Ca to 2 mEq/L, decrease Mg to 2 mEq/L, increase Phos to 20 mmol/L, Cl:Ac 1:1 - Give Kphos 26mmol IV x 1 - Add daily multivitamin to TPN. Remove standard trace elements and add back zinc 5mg  and selenium 36mcg. Remove chromium while on RRT. - Continue mSSI + 75 units insulin regular in TPN and adjust as needed  - F/u TPN labs. Watch TG trend closely   Arturo Morton, PharmD, BCPS Please check AMION for all [place] contact numbers Clinical Pharmacist 11/16/2020 7:47 AM

## 2020-11-16 NOTE — Progress Notes (Signed)
30 Days Post-Op   Subjective/Chief Complaint: Weaning pressors overnight   Objective: Vital signs in last 24 hours: Temp:  [97.8 F (36.6 C)-98.7 F (37.1 C)] 98 F (36.7 C) (05/08 0400) Pulse Rate:  [59-110] 62 (05/08 0800) Resp:  [15-33] 24 (05/08 0800) BP: (87-114)/(43-70) 107/70 (05/08 0800) SpO2:  [95 %-100 %] 100 % (05/08 0800) Arterial Line BP: (102-142)/(38-58) 131/57 (05/08 0800) FiO2 (%):  [40 %] 40 % (05/08 0329) Weight:  [109.3 kg] 109.3 kg (05/08 0500) Last BM Date:  (pta)  Intake/Output from previous day: 05/07 0701 - 05/08 0700 In: 4759.4 [I.V.:4359.2; IV Piggyback:400.1] Out: 6422 [Urine:15; Drains:970] Intake/Output this shift: Total I/O In: 173.9 [I.V.:173.9] Out: 198 [Other:198]  PE: Const: follows CV:RRR Abdomen: soft, nondistended, vac in place on midline wound. RUQ JP x1 with bilious fluid. Biliary drain with bile. LUQ perc drain to gravity drainage with purulent fluid. Pancreatic stent draining clear colorless fluid. G tube in place to gravity drainage, draining clear nonbloody gastric contents. Extremities: warm and well-perfused  Lab Results:  Recent Labs    11/15/20 0455 11/16/20 0442  WBC 20.4* 19.8*  HGB 8.1* 7.5*  HCT 25.1* 24.7*  PLT 174 144*   BMET Recent Labs    11/15/20 1600 11/16/20 0442  NA 135 135  K 4.2 3.8  CL 102 104  CO2 26 25  GLUCOSE 147* 141*  BUN 40* 38*  CREATININE 1.49* 1.50*  CALCIUM 8.0* 8.1*  Anti-infectives: Anti-infectives (From admission, onward)   Start     Dose/Rate Route Frequency Ordered Stop   11/12/20 1000  anidulafungin (ERAXIS) 100 mg in sodium chloride 0.9 % 100 mL IVPB        100 mg 78 mL/hr over 100 Minutes Intravenous Every 24 hours 11/11/20 0813 11/15/20 1142   11/12/20 1000  vancomycin (VANCOREADY) IVPB 1000 mg/200 mL  Status:  Discontinued        1,000 mg 200 mL/hr over 60 Minutes Intravenous Every 24 hours 11/11/20 0829 11/13/20 0758   11/11/20 1400  piperacillin-tazobactam (ZOSYN)  IVPB 3.375 g  Status:  Discontinued        3.375 g 12.5 mL/hr over 240 Minutes Intravenous Every 6 hours 11/11/20 0829 11/11/20 0830   11/11/20 1400  piperacillin-tazobactam (ZOSYN) IVPB 3.375 g        3.375 g 100 mL/hr over 30 Minutes Intravenous Every 6 hours 11/11/20 0830     11/11/20 0900  vancomycin (VANCOREADY) IVPB 2000 mg/400 mL        2,000 mg 200 mL/hr over 120 Minutes Intravenous  Once 11/11/20 0813 11/11/20 1124   11/11/20 0900  anidulafungin (ERAXIS) 200 mg in sodium chloride 0.9 % 200 mL IVPB        200 mg 78 mL/hr over 200 Minutes Intravenous  Once 11/11/20 0813 11/11/20 1328   11/09/20 2200  piperacillin-tazobactam (ZOSYN) 2.25 g in sodium chloride 0.9 % 50 mL IVPB  Status:  Discontinued        2.25 g 100 mL/hr over 30 Minutes Intravenous Every 8 hours 11/09/20 1611 11/11/20 0829   11/09/20 1700  piperacillin-tazobactam (ZOSYN) IVPB 3.375 g        3.375 g 100 mL/hr over 30 Minutes Intravenous  Once 11/09/20 1611 11/09/20 1657   10/26/20 1000  piperacillin-tazobactam (ZOSYN) IVPB 2.25 g  Status:  Discontinued        2.25 g 100 mL/hr over 30 Minutes Intravenous Every 12 hours 10/25/20 2233 10/26/20 0715   10/26/20 0815  piperacillin-tazobactam (ZOSYN) IVPB 2.25  g  Status:  Discontinued        2.25 g 100 mL/hr over 30 Minutes Intravenous Every 8 hours 10/26/20 0715 10/29/20 0647   10/22/20 0930  anidulafungin (ERAXIS) 100 mg in sodium chloride 0.9 % 100 mL IVPB  Status:  Discontinued       "Followed by" Linked Group Details   100 mg 78 mL/hr over 100 Minutes Intravenous Every 24 hours 10/21/20 0840 10/29/20 0647   10/21/20 2200  piperacillin-tazobactam (ZOSYN) IVPB 2.25 g  Status:  Discontinued        2.25 g 100 mL/hr over 30 Minutes Intravenous Every 8 hours 10/21/20 1410 10/25/20 2233   10/21/20 1400  piperacillin-tazobactam (ZOSYN) IVPB 2.25 g  Status:  Discontinued        2.25 g 100 mL/hr over 30 Minutes Intravenous Every 8 hours 10/21/20 0742 10/21/20 1410    10/21/20 0930  anidulafungin (ERAXIS) 200 mg in sodium chloride 0.9 % 200 mL IVPB       "Followed by" Linked Group Details   200 mg 78 mL/hr over 200 Minutes Intravenous  Once 10/21/20 0840 10/21/20 1411   10/19/20 1600  piperacillin-tazobactam (ZOSYN) IVPB 3.375 g  Status:  Discontinued        3.375 g 100 mL/hr over 30 Minutes Intravenous Every 6 hours 10/19/20 1048 10/21/20 0742   10/18/20 1600  piperacillin-tazobactam (ZOSYN) IVPB 3.375 g  Status:  Discontinued        3.375 g 100 mL/hr over 30 Minutes Intravenous Every 6 hours 10/18/20 1123 10/19/20 1048   10/15/20 0400  piperacillin-tazobactam (ZOSYN) IVPB 3.375 g  Status:  Discontinued        3.375 g 12.5 mL/hr over 240 Minutes Intravenous Every 8 hours 10/14/20 2107 10/18/20 1123   10/14/20 2200  piperacillin-tazobactam (ZOSYN) IVPB 3.375 g        3.375 g 100 mL/hr over 30 Minutes Intravenous  Once 10/14/20 2107 10/14/20 2238   10/01/20 1100  anidulafungin (ERAXIS) 100 mg in sodium chloride 0.9 % 100 mL IVPB  Status:  Discontinued       "Followed by" Linked Group Details   100 mg 78 mL/hr over 100 Minutes Intravenous Every 24 hours 09/30/20 1014 10/08/20 0951   09/30/20 1100  anidulafungin (ERAXIS) 200 mg in sodium chloride 0.9 % 200 mL IVPB       "Followed by" Linked Group Details   200 mg 78 mL/hr over 200 Minutes Intravenous  Once 09/30/20 1014 09/30/20 1516   10/01/2020 1600  vancomycin (VANCOREADY) IVPB 1000 mg/200 mL  Status:  Discontinued        1,000 mg 200 mL/hr over 60 Minutes Intravenous Every 24 hours 09/21/2020 1137 09/13/2020 1510   09/15/2020 1400  piperacillin-tazobactam (ZOSYN) IVPB 3.375 g  Status:  Discontinued        3.375 g 12.5 mL/hr over 240 Minutes Intravenous Every 8 hours 10/09/2020 1322 10/08/20 0952   09/17/2020 1430  vancomycin (VANCOREADY) IVPB 1750 mg/350 mL  Status:  Discontinued        1,750 mg 175 mL/hr over 120 Minutes Intravenous Every 24 hours 09/24/2020 1349 09/28/2020 1137   09/26/20 1130  vancomycin  (VANCOREADY) IVPB 2000 mg/400 mL        2,000 mg 200 mL/hr over 120 Minutes Intravenous  Once 09/26/20 1031 09/26/20 1411   09/26/20 1030  vancomycin variable dose per unstable renal function (pharmacist dosing)  Status:  Discontinued         Does not apply See admin instructions  09/26/20 1031 09/28/20 0816   09/26/2020 2200  metroNIDAZOLE (FLAGYL) IVPB 500 mg  Status:  Discontinued        500 mg 100 mL/hr over 60 Minutes Intravenous Every 8 hours 10/05/2020 1820 10/04/2020 1322   09/21/2020 2000  cefTRIAXone (ROCEPHIN) 2 g in sodium chloride 0.9 % 100 mL IVPB  Status:  Discontinued        2 g 200 mL/hr over 30 Minutes Intravenous Every 24 hours 09/12/2020 1820 09/20/2020 1322   09/13/2020 1330  vancomycin (VANCOCIN) 2,250 mg in sodium chloride 0.9 % 500 mL IVPB  Status:  Discontinued        2,250 mg 250 mL/hr over 120 Minutes Intravenous Every 48 hours 09/09/2020 1228 10/09/2020 1320   10/02/2020 1300  cefTRIAXone (ROCEPHIN) 2 g in sodium chloride 0.9 % 100 mL IVPB  Status:  Discontinued        2 g 200 mL/hr over 30 Minutes Intravenous Every 24 hours 10/03/2020 1208 09/24/2020 1320   10/08/2020 1300  metroNIDAZOLE (FLAGYL) IVPB 500 mg  Status:  Discontinued        500 mg 100 mL/hr over 60 Minutes Intravenous Every 8 hours 09/21/2020 1208 10/03/2020 1320   09/26/2020 0915  metroNIDAZOLE (FLAGYL) IVPB 500 mg  Status:  Discontinued        500 mg 100 mL/hr over 60 Minutes Intravenous To Surgery 09/10/2020 0903 09/21/2020 0951   10/05/2020 0630  ceFAZolin (ANCEF) IVPB 2g/100 mL premix       "And" Linked Group Details   2 g 200 mL/hr over 30 Minutes Intravenous On call to O.R. 10/06/2020 1696 09/26/2020 1215   09/14/2020 0630  metroNIDAZOLE (FLAGYL) IVPB 500 mg       "And" Linked Group Details   500 mg 100 mL/hr over 60 Minutes Intravenous On call to O.R. 10/03/2020 7893 09/18/2020 0820      Assessment/Plan: 50 yo male 6 weeks s/p Whipple, right colectomy and SMV repair for T3N0 neuroendocrine tumor. C/b postop hemorrhagic shock with  DIC and subsequent diffuse small bowel necrosis, now s/p small bowel resection with external drainage of bile duct, pancreatic duct and stomach. -ICD turned on Yesterday.  Per [employer] rep, battery may need to be changed if it fires again.  RN aware - Continue broad-spectrum antibiotics and antifungal. Cultures remain negative to date. - CRRT - Continue TPN - Fentanyl for pain control - VTE: SQH - Dispo: ICU. Appreciate assistance from CCM and palliative. Palliative care met with family yesterday but per their notes family does not seem very receptive to discussions and want to continue aggressive medical care.  Updates family member in room  Cctime: 51mn   LOS: 570days    ARalene Ok5/02/2021

## 2020-11-17 DIAGNOSIS — N179 Acute kidney failure, unspecified: Secondary | ICD-10-CM | POA: Diagnosis not present

## 2020-11-17 DIAGNOSIS — L0291 Cutaneous abscess, unspecified: Secondary | ICD-10-CM | POA: Diagnosis not present

## 2020-11-17 DIAGNOSIS — D3A8 Other benign neuroendocrine tumors: Secondary | ICD-10-CM | POA: Diagnosis not present

## 2020-11-17 DIAGNOSIS — J9601 Acute respiratory failure with hypoxia: Secondary | ICD-10-CM | POA: Diagnosis not present

## 2020-11-17 LAB — CBC
HCT: 25 % — ABNORMAL LOW (ref 39.0–52.0)
Hemoglobin: 7.6 g/dL — ABNORMAL LOW (ref 13.0–17.0)
MCH: 25.8 pg — ABNORMAL LOW (ref 26.0–34.0)
MCHC: 30.4 g/dL (ref 30.0–36.0)
MCV: 84.7 fL (ref 80.0–100.0)
Platelets: 128 10*3/uL — ABNORMAL LOW (ref 150–400)
RBC: 2.95 MIL/uL — ABNORMAL LOW (ref 4.22–5.81)
RDW: 22.7 % — ABNORMAL HIGH (ref 11.5–15.5)
WBC: 16.3 10*3/uL — ABNORMAL HIGH (ref 4.0–10.5)
nRBC: 3.5 % — ABNORMAL HIGH (ref 0.0–0.2)

## 2020-11-17 LAB — GLUCOSE, CAPILLARY
Glucose-Capillary: 138 mg/dL — ABNORMAL HIGH (ref 70–99)
Glucose-Capillary: 131 mg/dL — ABNORMAL HIGH (ref 70–99)
Glucose-Capillary: 108 mg/dL — ABNORMAL HIGH (ref 70–99)
Glucose-Capillary: 119 mg/dL — ABNORMAL HIGH (ref 70–99)
Glucose-Capillary: 115 mg/dL — ABNORMAL HIGH (ref 70–99)

## 2020-11-17 LAB — DIFFERENTIAL
Abs Immature Granulocytes: 4.14 10*3/uL — ABNORMAL HIGH (ref 0.00–0.07)
Basophils Absolute: 0.1 10*3/uL (ref 0.0–0.1)
Basophils Relative: 1 %
Eosinophils Absolute: 0.3 10*3/uL (ref 0.0–0.5)
Eosinophils Relative: 2 %
Immature Granulocytes: 25 %
Lymphocytes Relative: 11 %
Lymphs Abs: 1.7 10*3/uL (ref 0.7–4.0)
Monocytes Absolute: 1.3 10*3/uL — ABNORMAL HIGH (ref 0.1–1.0)
Monocytes Relative: 8 %
Neutro Abs: 8.7 10*3/uL — ABNORMAL HIGH (ref 1.7–7.7)
Neutrophils Relative %: 53 %

## 2020-11-17 LAB — RENAL FUNCTION PANEL
Albumin: 1.3 g/dL — ABNORMAL LOW (ref 3.5–5.0)
Anion gap: 7 (ref 5–15)
BUN: 37 mg/dL — ABNORMAL HIGH (ref 6–20)
CO2: 25 mmol/L (ref 22–32)
Calcium: 8.3 mg/dL — ABNORMAL LOW (ref 8.9–10.3)
Chloride: 103 mmol/L (ref 98–111)
Creatinine, Ser: 1.38 mg/dL — ABNORMAL HIGH (ref 0.61–1.24)
GFR, Estimated: >60 mL/min (ref >60)
Glucose, Bld: 143 mg/dL — ABNORMAL HIGH (ref 70–99)
Phosphorus: 4.8 mg/dL — ABNORMAL HIGH (ref 2.5–4.6)
Potassium: 4.2 mmol/L (ref 3.5–5.1)
Sodium: 135 mmol/L (ref 135–145)

## 2020-11-17 LAB — PREALBUMIN: Prealbumin: 16.5 mg/dL — ABNORMAL LOW (ref 18–38)

## 2020-11-17 LAB — COMPREHENSIVE METABOLIC PANEL
ALT: 124 U/L — ABNORMAL HIGH (ref 0–44)
AST: 96 U/L — ABNORMAL HIGH (ref 15–41)
Albumin: 1.3 g/dL — ABNORMAL LOW (ref 3.5–5.0)
Alkaline Phosphatase: 131 U/L — ABNORMAL HIGH (ref 38–126)
Anion gap: 6 (ref 5–15)
BUN: 37 mg/dL — ABNORMAL HIGH (ref 6–20)
CO2: 25 mmol/L (ref 22–32)
Calcium: 8.2 mg/dL — ABNORMAL LOW (ref 8.9–10.3)
Chloride: 104 mmol/L (ref 98–111)
Creatinine, Ser: 1.39 mg/dL — ABNORMAL HIGH (ref 0.61–1.24)
GFR, Estimated: >60 mL/min (ref >60)
Glucose, Bld: 143 mg/dL — ABNORMAL HIGH (ref 70–99)
Potassium: 3.8 mmol/L (ref 3.5–5.1)
Sodium: 135 mmol/L (ref 135–145)
Total Bilirubin: 13 mg/dL — ABNORMAL HIGH (ref 0.3–1.2)
Total Protein: 6.5 g/dL (ref 6.5–8.1)

## 2020-11-17 LAB — TRIGLYCERIDES: Triglycerides: 257 mg/dL — ABNORMAL HIGH (ref <150)

## 2020-11-17 LAB — PATHOLOGIST SMEAR REVIEW

## 2020-11-17 LAB — PHOSPHORUS: Phosphorus: 3.5 mg/dL (ref 2.5–4.6)

## 2020-11-17 LAB — MAGNESIUM: Magnesium: 2.3 mg/dL (ref 1.7–2.4)

## 2020-11-17 MED ORDER — SODIUM PHOSPHATES 45 MMOLE/15ML IV SOLN
INTRAVENOUS | Status: AC
  Filled 2020-11-17: qty 2052

## 2020-11-17 NOTE — Progress Notes (Signed)
OT Cancellation Note  Patient Details Name: [name] MRN: [id number] DOB: 17-May-1971   Cancelled Treatment:    Reason Eval/Treat Not Completed: Other (comment);Medical issues which prohibited therapy (Per discussion with RN, MD, and review of recent notes in the pt's chart, no acute OT needs given pt's current medical needs and prognosis. Please re-consult if change in medical status or stability.)   Jefferey Pica, OTR/L Acute Rehabilitation Services Pager: [phone] Office: [place] [facility] 11/17/2020, 5:26 PM

## 2020-11-17 NOTE — Progress Notes (Signed)
[name], MRN:  [id number], DOB:  12/15/1970, LOS: 24 ADMISSION DATE:  10/06/2020, CONSULTATION DATE:  11/17/20 REFERRING MD:  Anesthesia, CHIEF COMPLAINT:  Whipple, post-op shock   Brief History:  50 y.o. M with PMH of neuroendocrine tumor and underwent Whipple procedure 3/14 with intra-operative SMV laceration and repair by vascular with bovine pericardial patch.  Pt had worsening shock overnight and required three pressors despite massive transfusion protocol (received 150 blood products).  He was taken back to the OR for washout on 3/15 and returned to the ICU intubated.  Taken back 3/17 again found to have small bowel necrosis s/p resection.  Went back for ex-lap on 3/19, noted to have necrosis of nearly entirety of bowel, no bowel resected.  Fascia closed.  Case was discussed with family and [facility] transplant surgery for consideration of eventual small bowel transplant.  Electively returned to the OR on 3/21 for resection of remaining necrotic small bowel removed, G-tube placed, JP drains in place (2). Tracheostomy ws performed by Dr. Bobbye Morton on 4/8.  Started CRRT for AKI on 4/9.  See details below regarding complex hospitalization.   Significant Hospital Events:  3/14: Admit to Surgery, to OR for whipple, SMV injury and repair, massive transfusion protocol overnight  3/15: Back to OR for washout, x2. IR for arteriogram. No major bleed source identified in OR, or arterial bleed w IR. Robust product resuscitation >75 products (+ TXA + novoseven) 3/16: off pressors, add'l 39 products overnight. Slowing resuscitation this morning with hemodynamic improvements and slowing drain output; OR x 2 - washout, biliary t tube, wound vac -- washout, wound vac, IR for arteriogram -- no arterial bleed for embolization 3/17:  Aggressive balanced transfusion continue overnight with marked hemodynamic improvement since yesterday evening. Off pressors x several hours, Wound vac output significantly  slowing (from 585ml q15-36min to 520ml q1hr+) and output is much thinner, Thin bloody secretions from mouth approx 21ml, Decreased RR from 22 to 18, Unable to tolerate NE- severe bradycardia 3/19 taken back to OR. Small bowel noted to be almost completely necrosed. Patient was closed with plans for goals of car discussion.  3/20 [place] family requests full scope of treatment. Primary team discussed with [facility] the possibility of transfer for small bowel transplant. [facility] felt as though transfer would not change outcome. 3/21 Ongoing discussion with family and [facility]. Patient may be a candidate for transplant if he can stabilize post a small bowel resection. He was taken back to the OR and small bowel was resected.  3/23 weaning pressors. Versed off. Remains encephalopathic 3/24 off pressors. Low dose dilaudid gtt -- only weakly grimaces to pain. Changing sedation to precedex + PRN fentanyl. Long discussion with 2 brothers regarding clinical case -- tried to clarify that while the term "stable" has been used, in this instance is meaning that he has not declined from previous shift but is in fact still critically ill with multisystem organ dysfunction.  3/25 Cr and BUN increased. Off sedation  3/26 Pressors off, CT head benign, BUN/Cr continue to creep up 3/27 Tachycardia/HTN overnight improved with fentanyl gtt 3/28 PCCM sign off. Trauma to take over vent/CCM needs.  3/30 extubated 4/2 desaturated overnight to the 30s improved with BVM and NTS.  4/5 poor airway protection. PCCM consulted for re-intubation.  4/6 CT a/p Large RLL opacity, smaller LLL opacity. Post op changes. 5.4cm fluid collection in posterior pelvis, concerning for abscess. Started on Zosyn  4/7 got transfused overnight and then diuresed. Worse  renal function 4/8 Tracheostomy performed by general surgery 4/9 transfused again. HD line inserted and started on CRRT 4/10 started on trach collar trials. 4/12 off CRRT and transitioned to IHD.  Surgery placed abdominal drain with minimal output-- during procedure seemed c/w hematoma however since grew out klebsiella  4/13, 4/14 Tolerated dialysis 4/15 Placed on vent overnight for rest 4/16 Tolerating trach collar 4/17 got HD. Trach downsized to 6 cuffless, trach dislodged overnight, replaced with #6 cuffless shiley.  4/18 afebrile, trach collar + PMV. Up to chair with PT, PCCM signed off 4/21 decannulated; called back for hypotension, Afib/RVR, increased output from his 5 drains and wound vac.Transfused 2 units, re-cannulated, on Levo and Neo.  ICD fired x2, cardiology consulted 4/24 Continues to tolerate trach collar, 5L 28% 4/29 fever overnight, no complaints, remains on tracheostomy collar 4/30-5/1 fever continued, WBC unchanged, blood cultures sent, zosyn restarted 5/3 worsening shock, hypoxemia due to HCAP, back on ventilator, CRRT started again, made DNR, turned off ICD 5/6 ICD was turned back on   Consults:  PCCM VVS  IR  Palliative Cardiology Nephrology  Procedures:  PICC 3/25 > JP x 2  Biliary drain Pancreatic stent drain ETT 2/24>>3/13; 3/15 >> 3/29;  4/5>>4/8 Trach 4/8> L lateral abdominal drain 4/12>  Significant Diagnostic Tests:  See radiology tab Most recent significant imaging:  4/12 CT abd/pelvis>>  Unchanged ill-defined fluid collections within the left side of the root of the abdominal mesentery, the ventral aspect of the abdominal mesentery, the subcapsular aspect of the right lobe of the liver and the pelvic cul-de-sac all of which are incompletely evaluated on this noncontrast examination though may represent areas of evolving hematomas giving imaging stability for the past week. Redemonstrated ill-defined stranding surrounding the remaining portions of the pancreas without definable/drainable fluid collection on this noncontrast examination.  Micro Data:  See micro tab Most recent significant findings:   4/5 trach asp >> Klebsiella  pneumoniae 4/12 Pelvic abscess >> ampicillin resistant Klebsiella pneumoniae 4/12 BCx> no growth   5/1 blood cultures 5/3 resp culture > negative  Antimicrobials:  Zosyn 3/21 > 3/29; 4/5 >4/19; 5/1 >  Eraxis 3/23> 3/29, 4/12>4/19 5/3 > 5/7 Vanco 5/3 > 5/5   Interim History / Subjective:   No overnight issues, patient is on spontaneous breathing trial, 5/10, tolerating well Continue to require CRRT White count is trending down  Objective   Blood pressure 106/64, pulse 61, temperature (!) 97.4 F (36.3 C), temperature source Oral, resp. rate (!) 24, height 6\' 2"  (1.88 m), weight 99.7 kg, SpO2 100 %. CVP:  [11 mmHg-23 mmHg] 19 mmHg  Vent Mode: PRVC FiO2 (%):  [40 %] 40 % Set Rate:  [24 bmp] 24 bmp Vt Set:  [650 mL] 650 mL PEEP:  [5 cmH20] 5 cmH20 Pressure Support:  [12 cmH20] 12 cmH20 Plateau Pressure:  [17 cmH20-27 cmH20] 17 cmH20   Intake/Output Summary (Last 24 hours) at 11/17/2020 0743 Last data filed at 11/17/2020 0700 Gross per 24 hour  Intake 4723.37 ml  Output 5949 ml  Net -1225.63 ml   Filed Weights   11/15/20 0400 11/16/20 0500 11/17/20 0500  Weight: 111 kg 109.3 kg 99.7 kg   Physical Exam: General: chronically critically ill patient, s/p trach to vent Neuro: awake, alert, follows commands, diffusely weak Cardiovascular:  RRR, no mrg Lungs:  Clear bilateral breath sounds, no wheezes or rhonchi Abdomen: soft, wound vac present, multiple drains  Personally reviewed labs and imaging Na 135, K 3.8, Cr 1.3, WBC 16.3,  Hgb 7.6  Resolved Hospital Problem list   Hemorrhagic shock Septic shock  Klebsiella pneumoniae pneumonia Acute hypoxemic respiratory failure requiring mechanical ventilation  Assessment & Plan:   Acute respiratory failure with hypoxia > worsened due to HCAP S/p Tracheostomy  Continue mechanical vent support Continue PS trials as tolerated. Not appropriate for decannulation Watch for respiratory distress VAP prevention SpO2 goal >  90%  PAF/Atrial Flutter Systolic heart failure  ICD in place Tele Amiodarone OK if VT Back in NSR On 5/3 ICD was turned off but it had to be turned back on on 5/6 upon family request  Circulatory vs Septic Shock Off stress steroids as of 5/5 Patient is still requiring low-dose phenylephrine and vasopressin We will stop vasopressin today, continue phenylephrine, would aim for goal SBP over 90 to titrate pressors rather than MAP 65.   HCAP > still no organism identified Multiple abdominal abscesses Possible fungemia Eraxis was stopped on 5/7 after 7 days therapy. Low threshold to resume for clinical worsening given on TPN as well Hoping WBC improves after stopping steroids.  Continue zosyn day 9. Consider 10 day course for intra-abdominal infection and hcap.   S/p near complete small bowel resection: not a candidate for small bowel transplant currently TPN per pharmacy and [facility]  AKI now on HD Severe metabolic acidosis: most likely due to hypoperfusion from septic shock Continue CRRT   Daily Goals Checklist  Per primary Code Status: DNR. Global: remains critically ill. Continue vasopressors. ICD switched back on due to family request on 5/6. I still think the likelihood he would be able to achieve ambulatory status for small bowel evaluation is quite low. We will continue to follow for critical care needs.   Total critical care time: 32 minutes  Performed by: [place] care time was exclusive of separately billable procedures and treating other patients.   Critical care was necessary to treat or prevent imminent or life-threatening deterioration.   Critical care was time spent personally by me on the following activities: development of treatment plan with patient and/or surrogate as well as nursing, discussions with consultants, evaluation of patient's response to treatment, examination of patient, obtaining history from patient or surrogate, ordering and  performing treatments and interventions, ordering and review of laboratory studies, ordering and review of radiographic studies, pulse oximetry and re-evaluation of patient's condition.   Jacky Kindle MD [facility] See Amion for pager If no response to pager, please call [phone] until 7pm After 7pm, Please call E-link [phone]

## 2020-11-17 NOTE — Progress Notes (Signed)
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 Zenia Resides; complicated by hemorrhagic shockwith DIC and subsequent diffuse small bowel necrosis s/p small bowel resection 3/82/5053; further complication by intra-abdominal fluid collections/p left lateral abdominal drain placement in IR 10/21/2020; s/p left lateral abdominal drain exchange/revision/upsize in IR 11/07/2020.  Subjective:  Patient laying in bed resting comfortably, tracheostomy in place. He opens eyes to voice and nods to yes/no questions appropriately. On CRRT. RN at bedside. Left lateral abdominal drain site c/d/i.   Allergies: Ace inhibitors  Medications: Prior to Admission medications   Medication Sig Start Date End Date Taking? Authorizing Provider  acetaminophen (TYLENOL) 500 MG tablet Take 2 tablets (1,000 mg total) by mouth every 6 (six) hours. Patient taking differently: Take 1,000 mg by mouth every 6 (six) hours as needed for mild pain. 08/22/20  Yes [name], MD  allopurinol (ZYLOPRIM) 100 MG tablet Take one tablet by mouth daily for 1 week and then 2 tablets by mouth daily Patient taking differently: Take 200 mg by mouth daily. 07/03/20  Yes Swords, Darrick Penna, MD  amiodarone (PACERONE) 200 MG tablet Take 1 tablet (200 mg total) by mouth daily. Patient taking differently: Take 200 mg by mouth daily with supper. 07/30/20  Yes Evans Lance, MD  aspirin 81 MG tablet Take 1 tablet (81 mg total) by mouth 2 (two) times daily. Patient taking differently: Take 81 mg by mouth daily. 09/22/15  Yes Funches, Josalyn, MD  bisoprolol (ZEBETA) 10 MG tablet Take 1 tablet by mouth once daily Patient taking differently: Take 10 mg by mouth daily. 08/25/20  Yes Evans Lance, MD  budesonide-formoterol [facility]) 80-4.5 MCG/ACT inhaler  Inhale 2 puffs into the lungs 2 (two) times daily. Must keep upcoming office visit for refills Patient taking differently: Inhale 2 puffs into the lungs 2 (two) times daily as needed (for flares). 11/09/19  Yes Fulp, Cammie, MD  sacubitril-valsartan (ENTRESTO) 24-26 MG Take 1 tablet by mouth 2 (two) times daily. 06/25/20  Yes Evans Lance, MD  amiodarone (PACERONE) 200 MG tablet TAKE 1 TABLET (200 MG TOTAL) BY MOUTH DAILY. 07/30/20 07/30/21  Evans Lance, MD  amiodarone (PACERONE) 200 MG tablet TAKE 1 TABLET (200 MG TOTAL) BY MOUTH DAILY. 11/15/19 11/14/20  Evans Lance, MD  bisoprolol (ZEBETA) 10 MG tablet TAKE 1 TABLET (10 MG TOTAL) BY MOUTH DAILY. 11/15/19 11/14/20  Evans Lance, MD  oxyCODONE (OXY IR/ROXICODONE) 5 MG immediate release tablet Take 1-2 tablets (5-10 mg total) by mouth every 6 (six) hours as needed for severe pain. Patient not taking: Reported on 09/10/2020 08/22/20   [name], MD     Vital Signs: BP (!) 97/51   Pulse 67   Temp (!) 97.1 F (36.2 C) (Axillary)   Resp 19   Ht 6\' 2"  (1.88 m)   Wt 219 lb 12.8 oz (99.7 kg)   SpO2 100%   BMI 28.22 kg/m   Physical Exam Vitals and nursing note reviewed.  Constitutional:      General: He is not in acute distress.    Comments: Tracheostomy. On CRRT.  Pulmonary:     Effort: Pulmonary effort is normal. No respiratory distress.     Comments: Tracheostomy.  Abdominal:     Comments: (+) midline wound, woundvac in place. (+) one right surgical drain, one  left surgical drain, gastrostomy tube to gravity.  Left lateral abdominal drain site without erythema, drainage, or active bleeding; approximately 75 cc output of thick amber fluid in gravity bag; drain flushes/aspirates without resistance.  Skin:    General: Skin is warm and dry.  Neurological:     Mental Status: He is alert.     Comments: Tracheostomy. Nods appropriately to yes/no questions.      Imaging: DG Chest Port 1 View  Result Date: 11/14/2020 CLINICAL  DATA:  Acute respiratory failure. EXAM: PORTABLE CHEST 1 VIEW COMPARISON:  11/13/2020 FINDINGS: Cardiomegaly, tracheostomy tube, RIGHT IJ central venous catheter with tip overlying the mid SVC, RIGHT PICC line with tip overlying the SUPERIOR cavoatrial junction and LEFT-sided pacemaker/AICD again noted. Bilateral airspace opacities are stable to minimally improved. No large pleural effusion or pneumothorax identified. No other significant change noted. IMPRESSION: Stable to minimally improved bilateral airspace opacities. No other significant change. Electronically Signed   By: Margarette Canada M.D.   On: 11/14/2020 08:17    Labs:  CBC: Recent Labs    11/14/20 0508 11/15/20 0455 11/16/20 0442 11/17/20 0530  WBC 17.8* 20.4* 19.8* 16.3*  HGB 8.3* 8.1* 7.5* 7.6*  HCT 25.1* 25.1* 24.7* 25.0*  PLT 172 174 144* 128*    COAGS: Recent Labs    09/19/2020 1947 09/24/20 0400 09/24/20 1650 09/19/2020 1909 09/27/2020 1624 11/05/20 0656 11/05/20 1835 11/07/20 0522 11/08/20 0559 11/09/20 0500 11/10/20 0501  INR 0.9   < > 1.4* 1.4*   < > 2.2*   < > 1.3* 1.3* 1.4* 1.3*  APTT 35  --  32 39*  --  44*  --   --   --   --   --    < > = values in this interval not displayed.    BMP: Recent Labs    12/03/19 1703 05/24/20 1956 07/03/20 1643 07/21/20 0921 08/21/20 1037 11/15/20 1600 11/16/20 0442 11/16/20 1800 11/17/20 0530  NA 144   < > 142 142   < > 135 135 134* 135  K 4.6   < > 3.7 4.2   < > 4.2 3.8 4.2 3.8  CL 109*   < > 103 104   < > 102 104 104 104  CO2 21   < > 25 27   < > 26 25 24 25   GLUCOSE 103*   < > 105* 83   < > 147* 141* 161* 143*  BUN 15   < > 18 16   < > 40* 38* 37* 37*  CALCIUM 9.5   < > 9.1 9.5   < > 8.0* 8.1* 8.2* 8.2*  CREATININE 1.27   < > 0.96 1.19   < > 1.49* 1.50* 1.33* 1.39*  GFRNONAA 66   < > 92 71   < > 57* 57* >60 >60  GFRAA 77  --  107 82  --   --   --   --   --    < > = values in this interval not displayed.    LIVER FUNCTION TESTS: Recent Labs     11/11/20 1448 11/12/20 0413 11/13/20 0430 11/13/20 1618 11/14/20 0508 11/14/20 1613 11/15/20 1600 11/16/20 0442 11/16/20 1800 11/17/20 0530  BILITOT 5.7*  --  6.3*  --  6.5*  --   --   --   --  13.0*  AST 178*  --  161*  --  105*  --   --   --   --  96*  ALT 156*  --  227*  --  189*  --   --   --   --  124*  ALKPHOS 90  --  94  --  101  --   --   --   --  131*  PROT 8.2*  --  6.7  --  6.4*  --   --   --   --  6.5  ALBUMIN 1.5*  1.5*   < > 1.3*   < > 1.2*   < > 1.3* 1.2* 1.3* 1.3*   < > = values in this interval not displayed.    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 Zenia Resides; complicated by hemorrhagic shockwith DIC and subsequent diffuse small bowel necrosis s/p small bowel resection 09/22/9700; further complication by intra-abdominal fluid collections/p left lateral abdominal drain placement in IR 10/21/2020; s/p left lateral abdominal drain exchange/revision/upsize in IR 11/07/2020. Left lateral abdominal drain stable withapproximately 75 cc output thick amber fluid in gravity bag (additional 415 cc output from drain in past 24 hours per chart). Continue current drain management- continue with Qshift flushes/monitor of output. Plan for repeat CT/possible drain injection when output <10 cc/day (assess for possible removal). Further plans per CCS/CCM- appreciate and agree with management. IR to follow.   Electronically Signed: Earley Abide, PA-C 11/17/2020, 10:47 AM   I spent a total of 15 Minutes at the the patient's bedside AND on the patient's hospital floor or unit, greater than 50% of which was counseling/coordinating care for intra-abdominal fluid collection s/p left lateral abdominal drain placement.

## 2020-11-17 NOTE — Progress Notes (Signed)
PT Cancellation Note  Patient Details Name: [name] MRN: [id number] DOB: [birth year]   Cancelled Treatment:    Reason Eval/Treat Not Completed: Medical issues which prohibited therapy at this time. Per discussion with RN, MD, and review of recent notes in the pt's chart, no acute PT needs given pt's current medical needs and prognosis. Please re-consult if change in medical status or stability.   [name], PT, DPT   Acute Rehabilitation Department Pager #: [phone]   Otho Bellows 11/17/2020, 5:22 PM

## 2020-11-17 NOTE — Progress Notes (Signed)
I had a meeting this afternoon with the patient's brothers (one of which was present via phone) and their close family friend. Dr. Tacy Learn was present as well. I gave a detailed update on the patient's status since their last meeting with Dr. Lake Bells. Explained that Mackinley remains on two pressors, on CRRT, and is requiring ventilator support. In addition his bilirubin is now 13. We discussed that he is in multiorgan failure at this point and has overall shown no significant signs of improvement in the last 5-6 days. He remains bed-bound and profoundly deconditioned, which makes it further unlikely that he will liberate from the ventilator. I stated that I and multiple other physicians who have been closely involved in Julio's care are in agreement that he is highly unlikely to survive this hospitalization. They again asked about a small bowel transplant, and I emphasized that a transplant is not currently a consideration as he is in multiorgan failure. He is not currently a transplant candidate and is unlikely to make it out of the hospital to even be considered for a transplant workup.   I discussed that I think Marjorie is not likely to improve from his current condition, and will ultimately likely further deteriorate over time. He is at very high risk for further infections, including bacteremia, intraabdominal sepsis, and pneumonia. I feel that we have maximized our treatment options and given Rakan a lot of time to see if he will clinically improve, but he has been overall declining for several weeks now. The family expressed today, as in previous conversations, that they do not want to withdraw care due to their religious beliefs. They feel it should be left up to God if Jamicah dies, and to them that means continuing to support him and allowing God to determine the rest. It is reasonable to continue what we are currently doing, but I stressed that any further interventions, including procedures, addition of  pressors, and administering CPR in the event of an arrest are not going to change the ultimate outcome and would also prolong suffering. The family agrees to continue our current treatments (current pressors, antibiotics, CRRT and vent support) but not to escalate care any further. They also agree to keep Adarian DNR and are aware that means no external shocks or chest compressions. I explained that the ICD also administers shocks and is painful but they were clear that they want to keep the ICD turned on. For now we will continue CRRT, antibiotics, and current pressors but will not add any additional pressors if patient becomes more hypotensive. We will not do any further procedures or CT scans. DNR order is already in place in [place]. Bedside RN updated on current plan of care.  Michaelle Birks, MD [facility] Surgery General, Hepatobiliary and Pancreatic Surgery 11/17/20 5:42 PM

## 2020-11-17 NOTE — Progress Notes (Signed)
[facility] NEPHROLOGY PROGRESS NOTE  Assessment/ Plan: Pt is a 50 y.o. yo male with neuroendocrine tumor status post Whipple procedure right colectomy and SMV repair, developed AKI on dialysis.  Extensive dead small bowel requiring resection, TPN dependent  #AKI RRT dependent, prolonged, since 4/9. ATN.  No evidence of recovery and sig decompensation 5/3 req back on CRRT.  All 4K, slight neg UF, no anticoagulation.  Grim prognosis.  Palliative working with with family. Replete P if < 3.  If wanting to continue things will need vascath change out   #Acute respiratory failure required mechanical ventilation. Back on Vent, per CCM. [place] HCAP; slowly improving  #Neuroendocrine tumor status post Whipple procedure with extensive small bowel necrosis status post resection.  TPN dependent  #Severe protein calorie malnutrition, hypoalbuminemia; TPN dependent  #Anemia: Hemoglobin stable.  Transfuse PRBC as needed.  Per CCM and CCS  #Hypotension: Remains on 2 pressors-  Escalating dosing-  He has edema but is third spacing so will back off on UF to keep even   # Hyperkalemia, resolved  # metabolic and resp acidosis, as above.    Poor prognosis-  For family meeting today per surgery   Subjective:   No interval events  K 3.8, phosphorus 3.5, repleted   Remains on 2 pressors   All 4K bath  -1.2 L yesterday, anuric  Objective Vital signs in last 24 hours: Vitals:   11/17/20 0600 11/17/20 0700 11/17/20 0800 11/17/20 0900  BP: (!) 89/62 106/64 (!) 100/50 (!) 97/51  Pulse: 68 61 71 67  Resp: (!) 24 (!) 24 20 19   Temp:   (!) 97.1 F (36.2 C)   TempSrc:   Axillary   SpO2: 100% 100% 100% 100%  Weight:      Height:       Weight change: -9.6 kg  Intake/Output Summary (Last 24 hours) at 11/17/2020 0910 Last data filed at 11/17/2020 0900 Gross per 24 hour  Intake 4734.83 ml  Output 5942 ml  Net -1207.17 ml       Labs: Basic Metabolic Panel: Recent Labs  Lab  11/16/20 0442 11/16/20 1800 11/17/20 0530  NA 135 134* 135  K 3.8 4.2 3.8  CL 104 104 104  CO2 25 24 25   GLUCOSE 141* 161* 143*  BUN 38* 37* 37*  CREATININE 1.50* 1.33* 1.39*  CALCIUM 8.1* 8.2* 8.2*  PHOS 2.3* 4.0 3.5   Liver Function Tests: Recent Labs  Lab 11/13/20 0430 11/13/20 1618 11/14/20 0508 11/14/20 1613 11/16/20 0442 11/16/20 1800 11/17/20 0530  AST 161*  --  105*  --   --   --  96*  ALT 227*  --  189*  --   --   --  124*  ALKPHOS 94  --  101  --   --   --  131*  BILITOT 6.3*  --  6.5*  --   --   --  13.0*  PROT 6.7  --  6.4*  --   --   --  6.5  ALBUMIN 1.3*   < > 1.2*   < > 1.2* 1.3* 1.3*   < > = values in this interval not displayed.   No results for input(s): LIPASE, AMYLASE in the last 168 hours. No results for input(s): AMMONIA in the last 168 hours. CBC: Recent Labs  Lab 11/13/20 0430 11/13/20 0844 11/14/20 0508 11/15/20 0455 11/16/20 0442 11/17/20 0530  WBC 13.7* 13.8* 17.8* 20.4* 19.8* 16.3*  NEUTROABS 12.5* 11.7*  --   --   --  8.7*  HGB 8.4* 8.1* 8.3* 8.1* 7.5* 7.6*  HCT 26.5* 25.0* 25.1* 25.1* 24.7* 25.0*  MCV 80.1 79.4* 78.4* 80.4 84.0 84.7  PLT 162 160 172 174 144* 128*   Cardiac Enzymes: No results for input(s): CKTOTAL, CKMB, CKMBINDEX, TROPONINI in the last 168 hours. CBG: Recent Labs  Lab 11/16/20 1615 11/16/20 1945 11/16/20 2342 11/17/20 0341 11/17/20 0822  GLUCAP 155* 125* 112* 138* 131*    Iron Studies: No results for input(s): IRON, TIBC, TRANSFERRIN, FERRITIN in the last 72 hours. Studies/Results: No results found.  Medications: Infusions: .  prismasol BGK 4/2.5 1,000 mL/hr at 11/17/20 0706  .  prismasol BGK 4/2.5 500 mL/hr at 11/17/20 0201  . sodium chloride 10 mL/hr at 11/17/20 0900  . sodium chloride Stopped (11/08/20 1119)  . sodium chloride    . phenylephrine (NEO-SYNEPHRINE) Adult infusion 60 mcg/min (11/17/20 0900)  . piperacillin-tazobactam Stopped (11/17/20 0845)  . prismasol BGK 4/2.5 1,500 mL/hr at  11/17/20 0827  . promethazine (PHENERGAN) injection (IM or IVPB) Stopped (11/17/20 0455)  . TPN ADULT (ION) 150 mL/hr at 11/17/20 0700  . vasopressin 0.03 Units/min (11/17/20 0900)    Scheduled Medications: . chlorhexidine gluconate (MEDLINE KIT)  15 mL Mouth Rinse BID  . Chlorhexidine Gluconate Cloth  6 each Topical Q0600  . heparin injection (subcutaneous)  5,000 Units Subcutaneous Q8H  . insulin aspart  0-15 Units Subcutaneous Q4H  . mouth rinse  15 mL Mouth Rinse 10 times per day  . pantoprazole (PROTONIX) IV  40 mg Intravenous BID  . sodium chloride flush  10-40 mL Intracatheter Q12H  . sodium chloride flush  5 mL Intracatheter Q8H    have reviewed scheduled and prn medications.  Physical Exam: General: Lying in bed, on vent, somnolent Heart:RRR, s1s2 nl Lungs: Clear anteriorly Abdomen:soft, non-distended Extremities:  LE dep edema- third spacing  Dialysis Access: IJ HD temp catheter in place.for 29 days !  Orlena Garmon A Yassmin Binegar 11/17/2020,9:10 AM  LOS: 56 days

## 2020-11-17 NOTE — Progress Notes (Signed)
PHARMACY - TOTAL PARENTERAL NUTRITION CONSULT NOTE  Indication:  Short bowel syndrome  Patient Measurements: (updated) Height: 6\' 2"  (188 cm) Weight: 97.4 kg (214 lb 11.7 oz) IBW/kg (Calculated) : 82.2 TPN AdjBW (KG): 95.3 Body mass index is 27.57 kg/m.  Assessment:  40 YOM with history of mass adjacent to head of the pancreas in 2021. 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  Developed cholecystitis in Feb 2022 and had percutaneous cholecystectomy tube placement.  Presented this admission for neuroendocrine tumor resection with Whipple procedure, also had right total colectomy, colostomy and cholecystectomy on 09/18/2020. SMV was lacerated during procedure and repaired by bovine pericardial patch. Developed hemorrhagic shock with bile leak post-op and underwent placement of biliary T tube with abdominal washout on 3/15 PM. Antimicrobial therapies discontinued 4/20. Pharmacy consulted to manage TPN.   Glucose / Insulin: no hx DM, A1c 5.2%. CBGs <180. Stress steroids d/c'd again 5/5. Utilized 13 units mSSI in last 24 hours + 75 units in TPN Electrolytes: Transitioned back to CRRT on 5/3 - K 3.8 on 4K CRRT bags, Phos 3.5, corrected Ca slightly high ~10.4, Mag 2.3 Renal: CRRT 4/9-4/11 > iHD > back to CRRT on 5/3  Hepatic: LFTs mildly elevated - stable, Tbili up to 13 - will monitor closely and recheck in the AM - hesitate to re-attempt TPN cycling while on CRRT, TG 257 (5/9) << 193 (5/6). Albumin 1.3, prealbumin up to 16.5 (5/9) << 7.8 (5/2) TG peaked at 740 (3/26) Intake / Output; MIVF: none to minimal UOP, drain output (5 total): ~1300 mL, net +11.8L GI Imaging: 4/6 CT - pelvic fluid collection concerning for abscess 4/12 CT - no new fluid collections 4/28 CT - new fluid+gas collection in RLQ GI Surgeries / Procedures: 3/14 Whipple procedure with R hemicolectomy and end ileostomy, cholecystectomy 3/15 washout, VAC placement 3/17 washout, VAC replacement.  Necrosis  of entire small bowel except for the PB limb and proximal 40-50cm of jejunum 3/19 re-opening laparotomy, abd closure.  Necrotic SB. 3/21 SB resection, take down of choledochojejunal anastomosis and pancreaticojejunal anastomosis, take down of duodenojejunal anastomosis, placement of externalized biliary drain / Stamm gastrostomy tube, IA washout and placement of intraperitoneal drains 3/30 extubation> 4/8 trach 4/12 left lateral abdominal drain placed by IR  Central access: R IJ CVC placed 3/15; changed to PICC placed 10/03/20 TPN start date: 10/05/2020  Nutritional Goals (RD recommendation on 5/5): 2800-3000 kCal, 185-210g AA, fluid >2L/day  Lipid days (MWF): 3340 kcal with 205g protein, 540g CHO, 68g SMOF lipid NON-lipid days (TThSaSun): 2657kcal with 205g protein, 540g CHO  Current Nutrition:  TPN; NPO  Plan: - Continue TPN at goal rate 150 mL/hr at 1800 - Continue 19g/L SMOF lipids on MWF to prevent EFAD due to elevated TG - TPN will provide weekly average of 2949 kCal and 205g AA per day, meeting 100% of needs - Electrolytes in TPN: Na 149mEq/L, K 15 mEq/L, cont Ca at 2 mEq/L, cont Mg to 2 mEq/L, cont with Phos 20 mmol/L (while on CRRT), Cl:Ac 1:1 - Add daily multivitamin to TPN. Remove standard trace elements and add back zinc 5mg  and selenium 36mcg. Remove chromium while on RRT. - Continue mSSI + 75 units insulin regular in TPN and adjust as needed  - F/u TPN labs. Watch TG trend closely, daily RFP w/ CRRT, recheck TBili in the AM  Thank you for allowing pharmacy to be a part of this patient's care.  Alycia Rossetti, PharmD, BCPS Clinical Pharmacist  Clinical phone for 11/17/2020: [phone] 11/17/2020 7:44 AM   **Pharmacist phone directory can now be found on [email or web address] ([email or web address]).  Listed under [place].

## 2020-11-17 NOTE — Progress Notes (Signed)
31 Days Post-Op  Subjective: Levo changed to neo over weekend due to arrhythmia. This morning patient is on neo at 40 and vaso. ICD turned back per family request. Bilirubin up to 13 this morning. Biliary drain is functioning. Bedside RN reports frequent episodes of emesis.  Objective: Vital signs in last 24 hours: Temp:  [97.4 F (36.3 C)-98.2 F (36.8 C)] 97.4 F (36.3 C) (05/08 2000) Pulse Rate:  [48-72] 61 (05/09 0700) Resp:  [14-26] 24 (05/09 0700) BP: (85-112)/(51-79) 106/64 (05/09 0700) SpO2:  [91 %-100 %] 100 % (05/09 0700) Arterial Line BP: (94-142)/(35-61) 133/59 (05/09 0700) FiO2 (%):  [40 %] 40 % (05/09 0312) Weight:  [99.7 kg] 99.7 kg (05/09 0500) Last BM Date:  (pta)  Intake/Output from previous day: 05/08 0701 - 05/09 0700 In: 4723.4 [I.V.:4167.9; IV Piggyback:555.4] Out: 5949 [Urine:20; Drains:1345] Intake/Output this shift: No intake/output data recorded.  PE: General: resting comfortably, NAD Neuro: no focal deficits HEENT: trach in place, site is clean Resp: trach in place, on vent Vent Mode: PRVC FiO2 (%):  [40 %] 40 % Set Rate:  [24 bmp] 24 bmp Vt Set:  [650 mL] 650 mL PEEP:  [5 cmH20] 5 cmH20 Pressure Support:  [12 cmH20] 12 cmH20 Plateau Pressure:  [17 cmH20-27 cmH20] 17 cmH20 CV: RRR Abdomen: soft, nondistended, vac in place on midline wound. RUQ JP x1 with bilious fluid. Biliary drain with bile. LUQ perc drain to gravity drainage with purulent fluid. Pancreatic stent draining clear colorless fluid. G tube in place to gravity drainage, draining clear nonbloody gastric contents. Extremities: warm and well-perfused  Lab Results:  Recent Labs    11/16/20 0442 11/17/20 0530  WBC 19.8* 16.3*  HGB 7.5* 7.6*  HCT 24.7* 25.0*  PLT 144* 128*   BMET Recent Labs    11/16/20 1800 11/17/20 0530  NA 134* 135  K 4.2 3.8  CL 104 104  CO2 24 25  GLUCOSE 161* 143*  BUN 37* 37*  CREATININE 1.33* 1.39*  CALCIUM 8.2* 8.2*   PT/INR No results  for input(s): LABPROT, INR in the last 72 hours. CMP     Component Value Date/Time   NA 135 11/17/2020 0530   NA 142 07/21/2020 0921   K 3.8 11/17/2020 0530   CL 104 11/17/2020 0530   CO2 25 11/17/2020 0530   GLUCOSE 143 (H) 11/17/2020 0530   BUN 37 (H) 11/17/2020 0530   BUN 16 07/21/2020 0921   CREATININE 1.39 (H) 11/17/2020 0530   CREATININE 1.16 03/01/2016 1607   CALCIUM 8.2 (L) 11/17/2020 0530   PROT 6.5 11/17/2020 0530   PROT 6.7 07/21/2020 0921   ALBUMIN 1.3 (L) 11/17/2020 0530   ALBUMIN 4.1 07/21/2020 0921   AST 96 (H) 11/17/2020 0530   ALT 124 (H) 11/17/2020 0530   ALKPHOS 131 (H) 11/17/2020 0530   BILITOT 13.0 (H) 11/17/2020 0530   BILITOT 0.8 07/21/2020 0921   GFRNONAA >60 11/17/2020 0530   GFRNONAA >89 09/22/2015 1008   GFRAA 82 07/21/2020 0921   GFRAA >89 09/22/2015 1008   Lipase     Component Value Date/Time   LIPASE 23 08/22/2020 0318       Assessment/Plan 50 yo male 6 weeks s/p Whipple, right colectomy and SMV repair for T3N0 neuroendocrine tumor. C/b postop hemorrhagic shock with DIC and subsequent diffuse small bowel necrosis, now s/p small bowel resection with external drainage of bile duct, pancreatic duct and stomach. - Continue broad-spectrum antibiotics and antifungal. Cultures remain negative to date. -  CRRT - Continue TPN - Fentanyl for pain control - Wean pressors as tolerated - Hyperbilirubinemia: suspect this is from peritoneal absorption of bile and not biliary obstruction, as the drain is functioning and alk phos remains normal. May also be a component of biliary stasis from prolonged TPN. - VTE: SQH - Dispo: ICU. Appreciate assistance from CCM. Patient remains in multiorgan dysfunction with no clinical signs of improvement. Remains on vasopressor support and the ventilator and is very deconditioned. He is certainly not a transplant candidate at this point and I think it is extremely unlikely that he will survive to hospital discharge. I  think further escalation of care at this point will not change his outcome and would not offer further invasive interventions at this point, as it would likely lead for further unnecessary suffering for the patient. I will plan to meet with the patient's immediate family to have a discussion about his care at this point.   LOS: 76 days    Michaelle Birks, MD [facility] Surgery General, Hepatobiliary and Pancreatic Surgery 11/17/20 7:48 AM

## 2020-11-18 DIAGNOSIS — D3A8 Other benign neuroendocrine tumors: Secondary | ICD-10-CM | POA: Diagnosis not present

## 2020-11-18 DIAGNOSIS — J9601 Acute respiratory failure with hypoxia: Secondary | ICD-10-CM | POA: Diagnosis not present

## 2020-11-18 DIAGNOSIS — N179 Acute kidney failure, unspecified: Secondary | ICD-10-CM | POA: Diagnosis not present

## 2020-11-18 LAB — HEPATIC FUNCTION PANEL
ALT: 123 U/L — ABNORMAL HIGH (ref 0–44)
AST: 104 U/L — ABNORMAL HIGH (ref 15–41)
Albumin: 1.3 g/dL — ABNORMAL LOW (ref 3.5–5.0)
Alkaline Phosphatase: 125 U/L (ref 38–126)
Bilirubin, Direct: 8 mg/dL — ABNORMAL HIGH (ref 0.0–0.2)
Indirect Bilirubin: 4.5 mg/dL — ABNORMAL HIGH (ref 0.3–0.9)
Total Bilirubin: 12.5 mg/dL — ABNORMAL HIGH (ref 0.3–1.2)
Total Protein: 6.3 g/dL — ABNORMAL LOW (ref 6.5–8.1)

## 2020-11-18 LAB — GLUCOSE, CAPILLARY
Glucose-Capillary: 100 mg/dL — ABNORMAL HIGH (ref 70–99)
Glucose-Capillary: 104 mg/dL — ABNORMAL HIGH (ref 70–99)
Glucose-Capillary: 115 mg/dL — ABNORMAL HIGH (ref 70–99)
Glucose-Capillary: 119 mg/dL — ABNORMAL HIGH (ref 70–99)
Glucose-Capillary: 119 mg/dL — ABNORMAL HIGH (ref 70–99)
Glucose-Capillary: 123 mg/dL — ABNORMAL HIGH (ref 70–99)

## 2020-11-18 LAB — RENAL FUNCTION PANEL
Albumin: 1.2 g/dL — ABNORMAL LOW (ref 3.5–5.0)
Albumin: 1.3 g/dL — ABNORMAL LOW (ref 3.5–5.0)
Anion gap: 6 (ref 5–15)
Anion gap: 10 (ref 5–15)
BUN: 38 mg/dL — ABNORMAL HIGH (ref 6–20)
BUN: 36 mg/dL — ABNORMAL HIGH (ref 6–20)
CO2: 24 mmol/L (ref 22–32)
CO2: 25 mmol/L (ref 22–32)
Calcium: 8 mg/dL — ABNORMAL LOW (ref 8.9–10.3)
Calcium: 8.2 mg/dL — ABNORMAL LOW (ref 8.9–10.3)
Chloride: 104 mmol/L (ref 98–111)
Chloride: 101 mmol/L (ref 98–111)
Creatinine, Ser: 1.42 mg/dL — ABNORMAL HIGH (ref 0.61–1.24)
Creatinine, Ser: 1.36 mg/dL — ABNORMAL HIGH (ref 0.61–1.24)
GFR, Estimated: >60 mL/min (ref >60)
GFR, Estimated: >60 mL/min (ref >60)
Glucose, Bld: 125 mg/dL — ABNORMAL HIGH (ref 70–99)
Glucose, Bld: 121 mg/dL — ABNORMAL HIGH (ref 70–99)
Phosphorus: 3.6 mg/dL (ref 2.5–4.6)
Phosphorus: 4.3 mg/dL (ref 2.5–4.6)
Potassium: 4 mmol/L (ref 3.5–5.1)
Potassium: 4 mmol/L (ref 3.5–5.1)
Sodium: 134 mmol/L — ABNORMAL LOW (ref 135–145)
Sodium: 136 mmol/L (ref 135–145)

## 2020-11-18 LAB — CBC
HCT: 24 % — ABNORMAL LOW (ref 39.0–52.0)
Hemoglobin: 7.5 g/dL — ABNORMAL LOW (ref 13.0–17.0)
MCH: 26.7 pg (ref 26.0–34.0)
MCHC: 31.3 g/dL (ref 30.0–36.0)
MCV: 85.4 fL (ref 80.0–100.0)
Platelets: 129 10*3/uL — ABNORMAL LOW (ref 150–400)
RBC: 2.81 MIL/uL — ABNORMAL LOW (ref 4.22–5.81)
RDW: 23.9 % — ABNORMAL HIGH (ref 11.5–15.5)
WBC: 14.1 10*3/uL — ABNORMAL HIGH (ref 4.0–10.5)
nRBC: 3.6 % — ABNORMAL HIGH (ref 0.0–0.2)

## 2020-11-18 LAB — MAGNESIUM: Magnesium: 2.4 mg/dL (ref 1.7–2.4)

## 2020-11-18 MED ORDER — ZINC CHLORIDE 1 MG/ML IV SOLN
INTRAVENOUS | Status: AC
  Filled 2020-11-18: qty 2052

## 2020-11-18 NOTE — Progress Notes (Signed)
PHARMACY - TOTAL PARENTERAL NUTRITION CONSULT NOTE  Indication:  Short bowel syndrome  Patient Measurements: (updated) Height: 6\' 2"  (188 cm) Weight: 97.4 kg (214 lb 11.7 oz) IBW/kg (Calculated) : 82.2 TPN AdjBW (KG): 95.3 Body mass index is 27.57 kg/m.  Assessment:  57 YOM with history of mass adjacent to head of the pancreas in 2021. 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  Developed cholecystitis in Feb 2022 and had percutaneous cholecystectomy tube placement.  Presented this admission for neuroendocrine tumor resection with Whipple procedure, also had right total colectomy, colostomy and cholecystectomy on 10/05/2020. SMV was lacerated during procedure and repaired by bovine pericardial patch. Developed hemorrhagic shock with bile leak post-op and underwent placement of biliary T tube with abdominal washout on 3/15 PM. Antimicrobial therapies discontinued 4/20. Pharmacy consulted to manage TPN.   Glucose / Insulin: no hx DM, A1c 5.2%. CBGs 100-120. Stress steroids d/c'd again 5/5. Utilized 4 units mSSI in last 24 hours + 75 units in TPN Electrolytes: Transitioned back to CRRT on 5/3 - K 4 on 4K CRRT bags, Phos 3.6, corrected Ca 10.2, Mag 2.4 Renal: CRRT 4/9-4/11 > iHD > back to CRRT on 5/3  Hepatic: LFTs mildly elevated - stable, Tbili up to 13 on 5/9, down to 12.5 on 5/10 - will monitor closely and recheck with 5/12 labs - hesitate to re-attempt TPN cycling while on CRRT, TG 257 (5/9) << 193 (5/6). Albumin 1.3, prealbumin up to 16.5 (5/9) << 7.8 (5/2) TG peaked at 740 (3/26) Intake / Output; MIVF: none to minimal UOP, drain output (5 total): ~1500 mL, net +11.8L GI Imaging: 4/6 CT - pelvic fluid collection concerning for abscess 4/12 CT - no new fluid collections 4/28 CT - new fluid+gas collection in RLQ GI Surgeries / Procedures: 3/14 Whipple procedure with R hemicolectomy and end ileostomy, cholecystectomy 3/15 washout, VAC placement 3/17 washout, VAC  replacement.  Necrosis of entire small bowel except for the PB limb and proximal 40-50cm of jejunum 3/19 re-opening laparotomy, abd closure.  Necrotic SB. 3/21 SB resection, take down of choledochojejunal anastomosis and pancreaticojejunal anastomosis, take down of duodenojejunal anastomosis, placement of externalized biliary drain / Stamm gastrostomy tube, IA washout and placement of intraperitoneal drains 3/30 extubation> 4/8 trach 4/12 left lateral abdominal drain placed by IR  Central access: R IJ CVC placed 3/15; changed to PICC placed 10/03/20 TPN start date: 09/26/2020  Nutritional Goals (RD recommendation on 5/5): 2800-3000 kCal, 185-210g AA, fluid >2L/day  Lipid days (MWF): 3340 kcal with 205g protein, 540g CHO, 68g SMOF lipid NON-lipid days (TThSaSun): 2657kcal with 205g protein, 540g CHO  Current Nutrition:  TPN; NPO  Plan: - Continue TPN at goal rate 150 mL/hr at 1800 - Continue 19g/L SMOF lipids on MWF to prevent EFAD due to elevated TG - TPN will provide weekly average of 2949 kCal and 205g AA per day, meeting 100% of needs - Electrolytes in TPN: Na 18mEq/L, K 15 mEq/L, cont Ca at 2 mEq/L, cont Mg to 2 mEq/L, cont with Phos 20 mmol/L (while on CRRT), Cl:Ac 1:1 - Add daily multivitamin to TPN. Remove standard trace elements and add back zinc 5mg  and selenium 8mcg. Remove chromium while on RRT. - Continue mSSI and reduce to 65 units insulin regular in TPN and adjust as needed  - F/u TPN labs. Watch TG trend closely, daily RFP w/ CRRT, recheck TBili with 5/12 labs since downtrended slightly  Thank you for allowing pharmacy to be a part of  this patient's care.  Alycia Rossetti, PharmD, BCPS Clinical Pharmacist Clinical phone for 11/18/2020: [phone] 11/18/2020 7:59 AM   **Pharmacist phone directory can now be found on [email or web address] ([email or web address]).  Listed under [place].

## 2020-11-18 NOTE — Progress Notes (Signed)
[facility] NEPHROLOGY PROGRESS NOTE  Assessment/ Plan: Pt is a 50 y.o. yo male with neuroendocrine tumor status post Whipple procedure right colectomy and SMV repair, developed AKI on dialysis.  Extensive dead small bowel requiring resection, TPN dependent- in hospital close to 60 days  #AKI RRT dependent, prolonged, since 4/9. ATN.  No evidence of recovery and sig decompensation 5/3 req back on CRRT.  All 4K, slight neg UF, no anticoagulation.  Grim prognosis.  Palliative working with with family. Replete P if < 3.  If wanting to continue things will need vascath change out ?  #Acute respiratory failure required mechanical ventilation. Back on Vent, per CCM. Liekly HCAP; slowly improving  #Neuroendocrine tumor status post Whipple procedure with extensive small bowel necrosis status post resection.  TPN dependent  #Severe protein calorie malnutrition, hypoalbuminemia; TPN dependent  #Anemia: Hemoglobin stable.  Transfuse PRBC as needed.  Per CCM and CCS  #Hypotension: Remains on 2 pressors-  Plans to not escalate dosing-  He has edema but is third spacing so have backed off on UF to keep even   # Hyperkalemia, resolved  # metabolic and resp acidosis, as above.    Poor prognosis-   family meeting -  Not actively withdrawing but not escalating   Subjective:   No interval events- surgery discussion with family-  They will not withdraw due to religious reasons-  Plan is to not escalate care  K 4.0, phosphorus 3.6  Remains on 2 pressors - plan is to stop one and not re add the second  All 4K bath  Ran even yesterday, anuric  Objective Vital signs in last 24 hours: Vitals:   11/18/20 0500 11/18/20 0600 11/18/20 0604 11/18/20 0700  BP: 102/65 (!) 72/62 96/63 108/61  Pulse: 69 71 68 71  Resp: (!) 24 (!) 24 (!) 24 (!) 24  Temp:      TempSrc:      SpO2: 97% 100% 100% 100%  Weight: 99.4 kg     Height:       Weight change: -0.3 kg  Intake/Output Summary (Last 24  hours) at 11/18/2020 0806 Last data filed at 11/18/2020 0700 Gross per 24 hour  Intake 4193.39 ml  Output 4327 ml  Net -133.61 ml       Labs: Basic Metabolic Panel: Recent Labs  Lab 11/17/20 0530 11/17/20 1608 11/18/20 0457  NA 135 135 134*  K 3.8 4.2 4.0  CL 104 103 104  CO2 25 25 24   GLUCOSE 143* 143* 125*  BUN 37* 37* 38*  CREATININE 1.39* 1.38* 1.42*  CALCIUM 8.2* 8.3* 8.0*  PHOS 3.5 4.8* 3.6   Liver Function Tests: Recent Labs  Lab 11/14/20 0508 11/14/20 1613 11/17/20 0530 11/17/20 1608 11/18/20 0457  AST 105*  --  96*  --  104*  ALT 189*  --  124*  --  123*  ALKPHOS 101  --  131*  --  125  BILITOT 6.5*  --  13.0*  --  12.5*  PROT 6.4*  --  6.5  --  6.3*  ALBUMIN 1.2*   < > 1.3* 1.3* 1.3*  1.2*   < > = values in this interval not displayed.   No results for input(s): LIPASE, AMYLASE in the last 168 hours. No results for input(s): AMMONIA in the last 168 hours. CBC: Recent Labs  Lab 11/13/20 0430 11/13/20 0844 11/14/20 0508 11/15/20 0455 11/16/20 0442 11/17/20 0530 11/18/20 0457  WBC 13.7* 13.8* 17.8* 20.4* 19.8* 16.3* 14.1*  NEUTROABS 12.5* 11.7*  --   --   --  8.7*  --   HGB 8.4* 8.1* 8.3* 8.1* 7.5* 7.6* 7.5*  HCT 26.5* 25.0* 25.1* 25.1* 24.7* 25.0* 24.0*  MCV 80.1 79.4* 78.4* 80.4 84.0 84.7 85.4  PLT 162 160 172 174 144* 128* 129*   Cardiac Enzymes: No results for input(s): CKTOTAL, CKMB, CKMBINDEX, TROPONINI in the last 168 hours. CBG: Recent Labs  Lab 11/17/20 1215 11/17/20 1613 11/17/20 1959 11/18/20 0006 11/18/20 0401  GLUCAP 108* 119* 115* 100* 115*    Iron Studies: No results for input(s): IRON, TIBC, TRANSFERRIN, FERRITIN in the last 72 hours. Studies/Results: No results found.  Medications: Infusions: .  prismasol BGK 4/2.5 1,000 mL/hr at 11/17/20 2245  .  prismasol BGK 4/2.5 500 mL/hr at 11/17/20 2316  . sodium chloride Stopped (11/18/20 0350)  . sodium chloride Stopped (11/08/20 1119)  . sodium chloride    .  phenylephrine (NEO-SYNEPHRINE) Adult infusion 35 mcg/min (11/18/20 0700)  . piperacillin-tazobactam 3.375 g (11/18/20 0759)  . prismasol BGK 4/2.5 1,500 mL/hr at 11/18/20 0139  . promethazine (PHENERGAN) injection (IM or IVPB) Stopped (11/17/20 0455)  . TPN ADULT (ION) 150 mL/hr at 11/18/20 0700  . vasopressin 0.03 Units/min (11/18/20 0700)    Scheduled Medications: . chlorhexidine gluconate (MEDLINE KIT)  15 mL Mouth Rinse BID  . Chlorhexidine Gluconate Cloth  6 each Topical Q0600  . heparin injection (subcutaneous)  5,000 Units Subcutaneous Q8H  . insulin aspart  0-15 Units Subcutaneous Q4H  . mouth rinse  15 mL Mouth Rinse 10 times per day  . pantoprazole (PROTONIX) IV  40 mg Intravenous BID  . sodium chloride flush  10-40 mL Intracatheter Q12H  . sodium chloride flush  5 mL Intracatheter Q8H    have reviewed scheduled and prn medications.  Physical Exam: General: Lying in bed, on vent, somnolent but can answer questions Heart:RRR, s1s2 nl Lungs: Clear anteriorly Abdomen:soft, non-distended Extremities:  LE dep edema- third spacing  Dialysis Access: IJ HD temp catheter in place for 30 days   Dyanne Yorks A Lakishia Bourassa 11/18/2020,8:06 AM  LOS: 57 days

## 2020-11-18 NOTE — Progress Notes (Signed)
NAME:  [name], MRN:  [id number], DOB:  [birth year], LOS: 37 ADMISSION DATE:  10/09/2020, CONSULTATION DATE:  11/18/20 REFERRING MD:  Anesthesia, CHIEF COMPLAINT:  Whipple, post-op shock   Brief History:  50 y.o. M with PMH of neuroendocrine tumor and underwent Whipple procedure 3/14 with intra-operative SMV laceration and repair by vascular with bovine pericardial patch.  Pt had worsening shock overnight and required three pressors despite massive transfusion protocol (received 150 blood products).  He was taken back to the OR for washout on 3/15 and returned to the ICU intubated.  Taken back 3/17 again found to have small bowel necrosis s/p resection.  Went back for ex-lap on 3/19, noted to have necrosis of nearly entirety of bowel, no bowel resected.  Fascia closed.  Case was discussed with family and [facility] transplant surgery for consideration of eventual small bowel transplant.  Electively returned to the OR on 3/21 for resection of remaining necrotic small bowel removed, G-tube placed, JP drains in place (2). Tracheostomy ws performed by Dr. Bobbye Morton on 4/8.  Started CRRT for AKI on 4/9.  See details below regarding complex hospitalization.   Significant Hospital Events:  3/14: Admit to Surgery, to OR for whipple, SMV injury and repair, massive transfusion protocol overnight  3/15: Back to OR for washout, x2. IR for arteriogram. No major bleed source identified in OR, or arterial bleed w IR. Robust product resuscitation >75 products (+ TXA + novoseven) 3/16: off pressors, add'l 39 products overnight. Slowing resuscitation this morning with hemodynamic improvements and slowing drain output; OR x 2 - washout, biliary t tube, wound vac -- washout, wound vac, IR for arteriogram -- no arterial bleed for embolization 3/17:  Aggressive balanced transfusion continue overnight with marked hemodynamic improvement since yesterday evening. Off pressors x several hours, Wound vac output significantly  slowing (from 540ml q15-18min to 587ml q1hr+) and output is much thinner, Thin bloody secretions from mouth approx 220ml, Decreased RR from 22 to 18, Unable to tolerate NE- severe bradycardia 3/19 taken back to OR. Small bowel noted to be almost completely necrosed. Patient was closed with plans for goals of car discussion.  3/20 Wallace family requests full scope of treatment. Primary team discussed with [facility] the possibility of transfer for small bowel transplant. [facility] felt as though transfer would not change outcome. 3/21 Ongoing discussion with family and [facility]. Patient may be a candidate for transplant if he can stabilize post a small bowel resection. He was taken back to the OR and small bowel was resected.  3/23 weaning pressors. Versed off. Remains encephalopathic 3/24 off pressors. Low dose dilaudid gtt -- only weakly grimaces to pain. Changing sedation to precedex + PRN fentanyl. Long discussion with 2 brothers regarding clinical case -- tried to clarify that while the term "stable" has been used, in this instance is meaning that he has not declined from previous shift but is in fact still critically ill with multisystem organ dysfunction.  3/25 Cr and BUN increased. Off sedation  3/26 Pressors off, CT head benign, BUN/Cr continue to creep up 3/27 Tachycardia/HTN overnight improved with fentanyl gtt 3/28 PCCM sign off. Trauma to take over vent/CCM needs.  3/30 extubated 4/2 desaturated overnight to the 30s improved with BVM and NTS.  4/5 poor airway protection. PCCM consulted for re-intubation.  4/6 CT a/p Large RLL opacity, smaller LLL opacity. Post op changes. 5.4cm fluid collection in posterior pelvis, concerning for abscess. Started on Zosyn  4/7 got transfused overnight and then diuresed. Worse  renal function 4/8 Tracheostomy performed by general surgery 4/9 transfused again. HD line inserted and started on CRRT 4/10 started on trach collar trials. 4/12 off CRRT and transitioned to IHD.  Surgery placed abdominal drain with minimal output-- during procedure seemed c/w hematoma however since grew out klebsiella  4/13, 4/14 Tolerated dialysis 4/15 Placed on vent overnight for rest 4/16 Tolerating trach collar 4/17 got HD. Trach downsized to 6 cuffless, trach dislodged overnight, replaced with #6 cuffless shiley.  4/18 afebrile, trach collar + PMV. Up to chair with PT, PCCM signed off 4/21 decannulated; called back for hypotension, Afib/RVR, increased output from his 5 drains and wound vac.Transfused 2 units, re-cannulated, on Levo and Neo.  ICD fired x2, cardiology consulted 4/24 Continues to tolerate trach collar, 5L 28% 4/29 fever overnight, no complaints, remains on tracheostomy collar 4/30-5/1 fever continued, WBC unchanged, blood cultures sent, zosyn restarted 5/3 worsening shock, hypoxemia due to HCAP, back on ventilator, CRRT started again, made DNR, turned off ICD 5/6 ICD was turned back on 5/9 had a family meeting with Dr. Zenia Resides, decision was to continue current care, do not escalate I.e: No addition of pressors   Consults:  PCCM VVS  IR  Palliative Cardiology Nephrology  Procedures:  PICC 3/25 > JP x 2  Biliary drain Pancreatic stent drain ETT 2/24>>3/13; 3/15 >> 3/29;  4/5>>4/8 Trach 4/8> L lateral abdominal drain 4/12>  Significant Diagnostic Tests:  See radiology tab Most recent significant imaging:  4/12 CT abd/pelvis>>  Unchanged ill-defined fluid collections within the left side of the root of the abdominal mesentery, the ventral aspect of the abdominal mesentery, the subcapsular aspect of the right lobe of the liver and the pelvic cul-de-sac all of which are incompletely evaluated on this noncontrast examination though may represent areas of evolving hematomas giving imaging stability for the past week. Redemonstrated ill-defined stranding surrounding the remaining portions of the pancreas without definable/drainable fluid collection on this  noncontrast examination.  Micro Data:  See micro tab Most recent significant findings:   4/5 trach asp >> Klebsiella pneumoniae 4/12 Pelvic abscess >> ampicillin resistant Klebsiella pneumoniae 4/12 BCx> no growth   5/1 blood cultures 5/3 resp culture > negative  Antimicrobials:  Zosyn 3/21 > 3/29; 4/5 >4/19; 5/1 >  Eraxis 3/23> 3/29, 4/12>4/19 5/3 > 5/7 Vanco 5/3 > 5/5   Interim History / Subjective:  No overnight issues, remained afebrile, white count is trending down Continue to require vasopressin and phenylephrine Complaining of lower abdominal pain this morning  Objective   Blood pressure 108/61, pulse 71, temperature 98.9 F (37.2 C), temperature source Axillary, resp. rate (!) 24, height 6\' 2"  (1.88 m), weight 99.4 kg, SpO2 100 %. CVP:  [12 mmHg-20 mmHg] 13 mmHg  Vent Mode: PRVC FiO2 (%):  [40 %] 40 % Set Rate:  [24 bmp] 24 bmp Vt Set:  [650 mL] 650 mL PEEP:  [5 cmH20] 5 cmH20 Pressure Support:  [10 cmH20] 10 cmH20 Plateau Pressure:  [23 cmH20-26 cmH20] 26 cmH20   Intake/Output Summary (Last 24 hours) at 11/18/2020 0754 Last data filed at 11/18/2020 0700 Gross per 24 hour  Intake 4368.53 ml  Output 4528 ml  Net -159.47 ml   Filed Weights   11/16/20 0500 11/17/20 0500 11/18/20 0500  Weight: 109.3 kg 99.7 kg 99.4 kg   Physical Exam: General: chronically critically ill patient, s/p trach to vent Neuro: awake, alert, follows commands, diffusely weak Cardiovascular:  RRR, no mrg Lungs:  Clear bilateral breath sounds, no wheezes or  rhonchi Abdomen: soft, wound vac present, multiple drains  Personally reviewed labs and imaging Na 135, K 3.8, Cr 1.3, WBC 16.3, Hgb 7.6  Resolved Hospital Problem list   Hemorrhagic shock Klebsiella pneumoniae pneumonia Acute hypoxemic respiratory failure requiring mechanical ventilation  Assessment & Plan:   Acute respiratory failure with hypoxia > worsened due to HCAP S/p Tracheostomy  Continue mechanical vent  support Today he is having lots of secretions, requiring frequent suctioning Continue PS trials as tolerated. Not appropriate for decannulation Watch for respiratory distress VAP prevention SpO2 goal > 90%  PAF/Atrial Flutter Systolic heart failure  ICD in place Continue tele Back in NSR  Circulatory vs Septic Shock Off stress steroids as of 5/5 Patient is still requiring low-dose phenylephrine and vasopressin Unfortunately vasopressin was not stopped yesterday, will try to stop it Continue phenylephrine, per family discussion we will not add a second vasopressors if his MAP drops   HCAP > still no organism identified Multiple abdominal abscesses Possible fungemia Eraxis was stopped on 5/7 after 7 days therapy. Low threshold to resume for clinical worsening given on TPN as well Hoping WBC improves after stopping steroids.  Count is trending down, he is afebrile Continue zosyn day 10, will continue for total 2 weeks therapy  S/p near complete small bowel resection: not a candidate for small bowel transplant currently TPN per pharmacy and CCS  AKI now on HD Severe metabolic acidosis: most likely due to hypoperfusion from septic shock Continue CRRT  Hyperbilirubinemia Patient total bilirubin went up to 13, could be due to hepatic injury from hypotension or absorption of bilirubin from peritoneal cavity Closely monitor  Daily Goals Checklist  Per primary Code Status: DNR. Global: remains critically ill. Continue vasopressors. ICD switched back on due to family request on 5/6. I still think the likelihood he would be able to achieve ambulatory status for small bowel evaluation is quite low. We will continue to follow for critical care needs.   Total critical care time: 31 minutes  Performed by: [place] care time was exclusive of separately billable procedures and treating other patients.   Critical care was necessary to treat or prevent imminent or  life-threatening deterioration.   Critical care was time spent personally by me on the following activities: development of treatment plan with patient and/or surrogate as well as nursing, discussions with consultants, evaluation of patient's response to treatment, examination of patient, obtaining history from patient or surrogate, ordering and performing treatments and interventions, ordering and review of laboratory studies, ordering and review of radiographic studies, pulse oximetry and re-evaluation of patient's condition.   Jacky Kindle MD [facility] See Amion for pager If no response to pager, please call [phone] until 7pm After 7pm, Please call E-link [phone]

## 2020-11-18 NOTE — Progress Notes (Signed)
32 Days Post-Op  Subjective: No acute changes. Remains on neo and vaso. Afebrile. Bilirubin stable at 12. Drains functioning.  Objective: Vital signs in last 24 hours: Temp:  [96.9 F (36.1 C)-98.9 F (37.2 C)] 98.9 F (37.2 C) (05/10 0400) Pulse Rate:  [59-195] 71 (05/10 0700) Resp:  [12-28] 24 (05/10 0700) BP: (72-121)/(50-74) 108/61 (05/10 0700) SpO2:  [97 %-100 %] 100 % (05/10 0700) Arterial Line BP: (98-133)/(39-57) 105/44 (05/10 0700) FiO2 (%):  [40 %] 40 % (05/10 0400) Weight:  [99.4 kg] 99.4 kg (05/10 0500) Last BM Date:  (pta)  Intake/Output from previous day: 05/09 0701 - 05/10 0700 In: 4368.5 [I.V.:4168.5; IV Piggyback:200] Out: 4528 [Drains:1635] Intake/Output this shift: No intake/output data recorded.  PE: General: resting comfortably, NAD HEENT: trach in place, site is clean Resp: trach in place, on vent Vent Mode: PRVC FiO2 (%):  [40 %] 40 % Set Rate:  [24 bmp] 24 bmp Vt Set:  [650 mL] 650 mL PEEP:  [5 cmH20] 5 cmH20 Pressure Support:  [10 cmH20] 10 cmH20 Plateau Pressure:  [23 cmH20-26 cmH20] 26 cmH20 CV: RRR Abdomen: soft, nondistended, vac in place on midline wound. RUQ JP x1 with bilious fluid. Biliary drain with bile. LUQ perc drain to gravity drainage with purulent fluid. Pancreatic stent draining clear colorless fluid. G tube in place to gravity drainage, draining clear nonbloody gastric contents. Extremities: warm and well-perfused  Lab Results:  Recent Labs    11/17/20 0530 11/18/20 0457  WBC 16.3* 14.1*  HGB 7.6* 7.5*  HCT 25.0* 24.0*  PLT 128* 129*   BMET Recent Labs    11/17/20 1608 11/18/20 0457  NA 135 134*  K 4.2 4.0  CL 103 104  CO2 25 24  GLUCOSE 143* 125*  BUN 37* 38*  CREATININE 1.38* 1.42*  CALCIUM 8.3* 8.0*   PT/INR No results for input(s): LABPROT, INR in the last 72 hours. CMP     Component Value Date/Time   NA 134 (L) 11/18/2020 0457   NA 142 07/21/2020 0921   K 4.0 11/18/2020 0457   CL 104  11/18/2020 0457   CO2 24 11/18/2020 0457   GLUCOSE 125 (H) 11/18/2020 0457   BUN 38 (H) 11/18/2020 0457   BUN 16 07/21/2020 0921   CREATININE 1.42 (H) 11/18/2020 0457   CREATININE 1.16 03/01/2016 1607   CALCIUM 8.0 (L) 11/18/2020 0457   PROT 6.3 (L) 11/18/2020 0457   PROT 6.7 07/21/2020 0921   ALBUMIN 1.2 (L) 11/18/2020 0457   ALBUMIN 1.3 (L) 11/18/2020 0457   ALBUMIN 4.1 07/21/2020 0921   AST 104 (H) 11/18/2020 0457   ALT 123 (H) 11/18/2020 0457   ALKPHOS 125 11/18/2020 0457   BILITOT 12.5 (H) 11/18/2020 0457   BILITOT 0.8 07/21/2020 0921   GFRNONAA >60 11/18/2020 0457   GFRNONAA >89 09/22/2015 1008   GFRAA 82 07/21/2020 0921   GFRAA >89 09/22/2015 1008       Assessment/Plan 50 yo male 6 weeks s/p Whipple, right colectomy and SMV repair for T3N0 neuroendocrine tumor. C/b postop hemorrhagic shock with DIC and subsequent diffuse small bowel necrosis, now s/p small bowel resection with external drainage of bile duct, pancreatic duct and stomach. - Continue antibiotics. WBC downtrending. - CRRT - Continue TPN - Fentanyl for pain control. Zofran and phenergan for nausea. - Continue current pressors, wean as tolerated. No additional pressors to be started. - VTE: SQH - Dispo: ICU. Continue current medical management. After discussions with family we have agreed to  continue DNR status with no further escalation of care.   LOS: 18 days    Michaelle Birks, MD [facility] General, Hepatobiliary and Pancreatic Surgery 11/18/20 7:30 AM

## 2020-11-19 DIAGNOSIS — D3A8 Other benign neuroendocrine tumors: Secondary | ICD-10-CM | POA: Diagnosis not present

## 2020-11-19 DIAGNOSIS — N179 Acute kidney failure, unspecified: Secondary | ICD-10-CM | POA: Diagnosis not present

## 2020-11-19 DIAGNOSIS — J9601 Acute respiratory failure with hypoxia: Secondary | ICD-10-CM | POA: Diagnosis not present

## 2020-11-19 LAB — CBC
HCT: 24.8 % — ABNORMAL LOW (ref 39.0–52.0)
Hemoglobin: 7.4 g/dL — ABNORMAL LOW (ref 13.0–17.0)
MCH: 26 pg (ref 26.0–34.0)
MCHC: 29.8 g/dL — ABNORMAL LOW (ref 30.0–36.0)
MCV: 87 fL (ref 80.0–100.0)
Platelets: 122 10*3/uL — ABNORMAL LOW (ref 150–400)
RBC: 2.85 MIL/uL — ABNORMAL LOW (ref 4.22–5.81)
RDW: 25.2 % — ABNORMAL HIGH (ref 11.5–15.5)
WBC: 13 10*3/uL — ABNORMAL HIGH (ref 4.0–10.5)
nRBC: 3 % — ABNORMAL HIGH (ref 0.0–0.2)

## 2020-11-19 LAB — GLUCOSE, CAPILLARY
Glucose-Capillary: 100 mg/dL — ABNORMAL HIGH (ref 70–99)
Glucose-Capillary: 101 mg/dL — ABNORMAL HIGH (ref 70–99)
Glucose-Capillary: 107 mg/dL — ABNORMAL HIGH (ref 70–99)
Glucose-Capillary: 108 mg/dL — ABNORMAL HIGH (ref 70–99)
Glucose-Capillary: 111 mg/dL — ABNORMAL HIGH (ref 70–99)
Glucose-Capillary: 117 mg/dL — ABNORMAL HIGH (ref 70–99)
Glucose-Capillary: 118 mg/dL — ABNORMAL HIGH (ref 70–99)

## 2020-11-19 LAB — RENAL FUNCTION PANEL
Albumin: 1.2 g/dL — ABNORMAL LOW (ref 3.5–5.0)
Albumin: 1.3 g/dL — ABNORMAL LOW (ref 3.5–5.0)
Anion gap: 10 (ref 5–15)
Anion gap: 8 (ref 5–15)
BUN: 36 mg/dL — ABNORMAL HIGH (ref 6–20)
BUN: 37 mg/dL — ABNORMAL HIGH (ref 6–20)
CO2: 24 mmol/L (ref 22–32)
CO2: 24 mmol/L (ref 22–32)
Calcium: 8.3 mg/dL — ABNORMAL LOW (ref 8.9–10.3)
Calcium: 8.4 mg/dL — ABNORMAL LOW (ref 8.9–10.3)
Chloride: 101 mmol/L (ref 98–111)
Chloride: 104 mmol/L (ref 98–111)
Creatinine, Ser: 1.43 mg/dL — ABNORMAL HIGH (ref 0.61–1.24)
Creatinine, Ser: 1.51 mg/dL — ABNORMAL HIGH (ref 0.61–1.24)
GFR, Estimated: >60 mL/min (ref >60)
GFR, Estimated: 56 mL/min — ABNORMAL LOW (ref >60)
Glucose, Bld: 104 mg/dL — ABNORMAL HIGH (ref 70–99)
Glucose, Bld: 112 mg/dL — ABNORMAL HIGH (ref 70–99)
Phosphorus: 4.3 mg/dL (ref 2.5–4.6)
Phosphorus: 5.2 mg/dL — ABNORMAL HIGH (ref 2.5–4.6)
Potassium: 3.9 mmol/L (ref 3.5–5.1)
Potassium: 4.3 mmol/L (ref 3.5–5.1)
Sodium: 135 mmol/L (ref 135–145)
Sodium: 136 mmol/L (ref 135–145)

## 2020-11-19 LAB — MAGNESIUM: Magnesium: 2.3 mg/dL (ref 1.7–2.4)

## 2020-11-19 MED ORDER — ZINC CHLORIDE 1 MG/ML IV SOLN
INTRAVENOUS | Status: AC
  Filled 2020-11-19: qty 2052

## 2020-11-19 NOTE — Progress Notes (Signed)
Nutrition Follow-up  DOCUMENTATION CODES:   Not applicable  INTERVENTION:   TNA to meet nutrition needs 19 g/L SMOF lipid on MWF to prevent EFAD due to elevated TG  Pt at high risk of malnutrition due to catabolic state.   NUTRITION DIAGNOSIS:   Increased nutrient needs related to post-op healing as evidenced by estimated needs. Ongoing.   GOAL:   Patient will meet greater than or equal to 90% of their needs Met on TNA.   MONITOR:   Labs,I & O's  REASON FOR ASSESSMENT:   Ventilator    ASSESSMENT:   Pt with a PMH of HTN, HLD, HF with reduced EF, AICD and dx mass adjacent to head of the pancreas in 2021.  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  Developed cholecystitis in Feb 2022 and had percutaneous cholecystectomy tube placement.  Presented this admission for neuroendocrine tumor resection with Whipple procedure, also had right total colectomy, colostomy and cholecystectomy on 09/18/2020.  SMV was lacerated during procedure and repaired by bovine pericardial patch.  Developed hemorrhagic shock with bile leak post-op and underwent placement of biliary T tube with abdominal washout on 3/15.    Pt discussed during ICU rounds and with RN.  Pt remains on CRRT and one pressor.   3/14 s/p whipple; intra-operative SMV laceration s/p repair; massive transfusion protocol - 120 products; maxed on pressors  3/15 s/p washout and VAC placement  3/16 off pressors; VAC output decreasing, hematemesis with NG not functioning, OG placed 3/17 s/p re-opening of recent laparotomy, SBR, VAC placement (per OR report pt with SB necrosis, only viable areas are stomach pb limb, 40-50 cm beyond GJ anastomosis, and colon) Pt will be TPN dependent. Concern that PB limb may become necrotic.  3/18 TPN initiated @ 45 ml/hr with plans to advance to 90 ml on 3/19 3/22 entirety of SB resected due to necrosis.  3/25 lipids removed from TPN due to elevated TG 3/30 extubated   4/5 re-intubated 4/9-4/12 CRRT 4/13 iHD 4/21 decannulated; pt became hypotensive after HD required re-cannulation levo and neo.  4/22 pt back to trach collar  4/27 wound VAC off now wet to dry dressings, decrease in hgb overnight given 1 u PRBC.  5/2 hypotensive back on pressors  5/3 back to CRRT   Medications reviewed and include: SSI, protonix Zosyn Levophed @ 70 mcg  Labs reviewed: BUN: 36, Cr: 1.43 TG: 1424 --> 257 CBG's: 101-111   Output:  UOP: 0 ml    Drain 3: 320 ml  LLQ drain: 560 ml  R drain:485 ml  RLQ drain: 15 ml  20 F G tube: 0 ml Abd VAC: 0 ml  Total output: 1380 ml  UF: 3196 ml I&O: +7 L  TPN @ 150 ml/hr (19 g/L SMOFlipid on MWF) 7 day average provides: 2949 kcal and 205 grams protein   Diet Order:   Diet Order            Diet NPO time specified  Diet effective now                 EDUCATION NEEDS:   No education needs have been identified at this time  Skin:  Skin Assessment: Skin Integrity Issues: Skin Integrity Issues:: Stage III Stage III: neck Wound Vac: NA Incisions: abdomen, neck  Last BM:  none  Height:   Ht Readings from Last 1 Encounters:  10/14/20 6' 2"  (1.88 m)    Weight:   Wt Readings  from Last 1 Encounters:  11/19/20 98.3 kg    BMI:  Body mass index is 27.82 kg/m.  Estimated Nutritional Needs:   Kcal:  2800-3000  Protein:  185-210 grams  Fluid:  > 2L/day  Lockie Pares., RD, LDN, CNSC See AMiON for contact information

## 2020-11-19 NOTE — Progress Notes (Signed)
33 Days Post-Op  Subjective: No major changes. Vasopressin is now off. Patient remains afebrile.  Objective: Vital signs in last 24 hours: Temp:  [97.8 F (36.6 C)-98.4 F (36.9 C)] 98.2 F (36.8 C) (05/11 0800) Pulse Rate:  [63-74] 74 (05/11 0800) Resp:  [12-30] 19 (05/11 0800) BP: (89-119)/(41-71) 97/56 (05/11 0800) SpO2:  [95 %-100 %] 100 % (05/11 0800) Arterial Line BP: (100-141)/(34-56) 105/40 (05/11 0800) FiO2 (%):  [40 %] 40 % (05/11 0740) Weight:  [98.3 kg] 98.3 kg (05/11 0500) Last BM Date:  (pta)  Intake/Output from previous day: 05/10 0701 - 05/11 0700 In: 4277.9 [I.V.:3982.8; IV Piggyback:295.1] Out: 4576 [Drains:1380] Intake/Output this shift: Total I/O In: 191.2 [I.V.:169; IV Piggyback:22.2] Out: 405 [Drains:225; Other:180]  PE: General: resting comfortably, NAD HEENT: trach in place, site is clean Resp: trach in place, on vent Vent Mode: CPAP;PSV FiO2 (%):  [40 %] 40 % Set Rate:  [24 bmp] 24 bmp Vt Set:  [650 mL] 650 mL PEEP:  [5 cmH20] 5 cmH20 Pressure Support:  [15 cmH20] 15 cmH20 Plateau Pressure:  [24 cmH20-27 cmH20] 24 cmH20 CV: RRR Abdomen: soft, nondistended, vac in place on midline wound. RUQ JP x1 with bilious fluid. Biliary drain with bile. LUQ perc drain to gravity drainage with purulent fluid. Pancreatic stent draining clear colorless fluid. G tube in place to gravity drainage, draining clear nonbloody gastric contents. Extremities: warm and well-perfused  Lab Results:  Recent Labs    11/18/20 0457 11/19/20 0509  WBC 14.1* 13.0*  HGB 7.5* 7.4*  HCT 24.0* 24.8*  PLT 129* 122*   BMET Recent Labs    11/18/20 1626 11/19/20 0509  NA 136 135  K 4.0 3.9  CL 101 101  CO2 25 24  GLUCOSE 121* 104*  BUN 36* 36*  CREATININE 1.36* 1.43*  CALCIUM 8.2* 8.3*   PT/INR No results for input(s): LABPROT, INR in the last 72 hours. CMP     Component Value Date/Time   NA 135 11/19/2020 0509   NA 142 07/21/2020 0921   K 3.9 11/19/2020  0509   CL 101 11/19/2020 0509   CO2 24 11/19/2020 0509   GLUCOSE 104 (H) 11/19/2020 0509   BUN 36 (H) 11/19/2020 0509   BUN 16 07/21/2020 0921   CREATININE 1.43 (H) 11/19/2020 0509   CREATININE 1.16 03/01/2016 1607   CALCIUM 8.3 (L) 11/19/2020 0509   PROT 6.3 (L) 11/18/2020 0457   PROT 6.7 07/21/2020 0921   ALBUMIN 1.2 (L) 11/19/2020 0509   ALBUMIN 4.1 07/21/2020 0921   AST 104 (H) 11/18/2020 0457   ALT 123 (H) 11/18/2020 0457   ALKPHOS 125 11/18/2020 0457   BILITOT 12.5 (H) 11/18/2020 0457   BILITOT 0.8 07/21/2020 0921   GFRNONAA >60 11/19/2020 0509   GFRNONAA >89 09/22/2015 1008   GFRAA 82 07/21/2020 0921   GFRAA >89 09/22/2015 1008       Assessment/Plan 50 yo male 6 weeks s/p Whipple, right colectomy and SMV repair for T3N0 neuroendocrine tumor. C/b postop hemorrhagic shock with DIC and subsequent diffuse small bowel necrosis, now s/p small bowel resection with external drainage of bile duct, pancreatic duct and stomach. - Continue antibiotics, plan for 2 week course - CRRT - TPN - Fentanyl for pain control. Zofran and phenergan for nausea. - Wean neo as tolerated. No escalation of pressors. - VTE: SQH - Dispo: ICU. Continue current care, no further escalation of care. DNR.   LOS: 53 days    Michaelle Birks, MD  [facility], Hepatobiliary and Pancreatic Surgery 11/19/20 8:45 AM

## 2020-11-19 NOTE — Progress Notes (Signed)
[name], MRN:  [id number], DOB:  [birth year], LOS: 58 ADMISSION DATE:  09/11/2020, CONSULTATION DATE:  11/19/20 REFERRING MD:  Anesthesia, CHIEF COMPLAINT:  Whipple, post-op shock   Brief History:  50 y.o. M with PMH of neuroendocrine tumor and underwent Whipple procedure 3/14 with intra-operative SMV laceration and repair by vascular with bovine pericardial patch.  Pt had worsening shock overnight and required three pressors despite massive transfusion protocol (received 150 blood products).  He was taken back to the OR for washout on 3/15 and returned to the ICU intubated.  Taken back 3/17 again found to have small bowel necrosis s/p resection.  Went back for ex-lap on 3/19, noted to have necrosis of nearly entirety of bowel, no bowel resected.  Fascia closed.  Case was discussed with family and [facility] transplant surgery for consideration of eventual small bowel transplant.  Electively returned to the OR on 3/21 for resection of remaining necrotic small bowel removed, G-tube placed, JP drains in place (2). Tracheostomy ws performed by Dr. Bobbye Morton on 4/8.  Started CRRT for AKI on 4/9.  See details below regarding complex hospitalization.   Significant Hospital Events:  3/14: Admit to Surgery, to OR for whipple, SMV injury and repair, massive transfusion protocol overnight  3/15: Back to OR for washout, x2. IR for arteriogram. No major bleed source identified in OR, or arterial bleed w IR. Robust product resuscitation >75 products (+ TXA + novoseven) 3/16: off pressors, add'l 39 products overnight. Slowing resuscitation this morning with hemodynamic improvements and slowing drain output; OR x 2 - washout, biliary t tube, wound vac -- washout, wound vac, IR for arteriogram -- no arterial bleed for embolization 3/17:  Aggressive balanced transfusion continue overnight with marked hemodynamic improvement since yesterday evening. Off pressors x several hours, Wound vac output significantly  slowing (from 580ml q15-24min to 540ml q1hr+) and output is much thinner, Thin bloody secretions from mouth approx 249ml, Decreased RR from 22 to 18, Unable to tolerate NE- severe bradycardia 3/19 taken back to OR. Small bowel noted to be almost completely necrosed. Patient was closed with plans for goals of car discussion.  3/20 [place] family requests full scope of treatment. Primary team discussed with [facility] the possibility of transfer for small bowel transplant. [facility] felt as though transfer would not change outcome. 3/21 Ongoing discussion with family and [facility]. Patient may be a candidate for transplant if he can stabilize post a small bowel resection. He was taken back to the OR and small bowel was resected.  3/23 weaning pressors. Versed off. Remains encephalopathic 3/24 off pressors. Low dose dilaudid gtt -- only weakly grimaces to pain. Changing sedation to precedex + PRN fentanyl. Long discussion with 2 brothers regarding clinical case -- tried to clarify that while the term "stable" has been used, in this instance is meaning that he has not declined from previous shift but is in fact still critically ill with multisystem organ dysfunction.  3/25 Cr and BUN increased. Off sedation  3/26 Pressors off, CT head benign, BUN/Cr continue to creep up 3/27 Tachycardia/HTN overnight improved with fentanyl gtt 3/28 PCCM sign off. Trauma to take over vent/CCM needs.  3/30 extubated 4/2 desaturated overnight to the 30s improved with BVM and NTS.  4/5 poor airway protection. PCCM consulted for re-intubation.  4/6 CT a/p Large RLL opacity, smaller LLL opacity. Post op changes. 5.4cm fluid collection in posterior pelvis, concerning for abscess. Started on Zosyn  4/7 got transfused overnight and then diuresed. Worse  renal function 4/8 Tracheostomy performed by general surgery 4/9 transfused again. HD line inserted and started on CRRT 4/10 started on trach collar trials. 4/12 off CRRT and transitioned to IHD.  Surgery placed abdominal drain with minimal output-- during procedure seemed c/w hematoma however since grew out klebsiella  4/13, 4/14 Tolerated dialysis 4/15 Placed on vent overnight for rest 4/16 Tolerating trach collar 4/17 got HD. Trach downsized to 6 cuffless, trach dislodged overnight, replaced with #6 cuffless shiley.  4/18 afebrile, trach collar + PMV. Up to chair with PT, PCCM signed off 4/21 decannulated; called back for hypotension, Afib/RVR, increased output from his 5 drains and wound vac.Transfused 2 units, re-cannulated, on Levo and Neo.  ICD fired x2, cardiology consulted 4/24 Continues to tolerate trach collar, 5L 28% 4/29 fever overnight, no complaints, remains on tracheostomy collar 4/30-5/1 fever continued, WBC unchanged, blood cultures sent, zosyn restarted 5/3 worsening shock, hypoxemia due to HCAP, back on ventilator, CRRT started again, made DNR, turned off ICD 5/6 ICD was turned back on 5/9 had a family meeting with Dr. Zenia Resides, decision was to continue current care, do not escalate I.e: No addition of pressors 5/10 Patient came off Vasopressin   Consults:  PCCM VVS  IR  Palliative Cardiology Nephrology  Procedures:  PICC 3/25 > JP x 2  Biliary drain Pancreatic stent drain ETT 2/24>>3/13; 3/15 >> 3/29;  4/5>>4/8 Trach 4/8> L lateral abdominal drain 4/12>  Significant Diagnostic Tests:  See radiology tab Most recent significant imaging:  4/12 CT abd/pelvis>>  Unchanged ill-defined fluid collections within the left side of the root of the abdominal mesentery, the ventral aspect of the abdominal mesentery, the subcapsular aspect of the right lobe of the liver and the pelvic cul-de-sac all of which are incompletely evaluated on this noncontrast examination though may represent areas of evolving hematomas giving imaging stability for the past week. Redemonstrated ill-defined stranding surrounding the remaining portions of the pancreas without  definable/drainable fluid collection on this noncontrast examination.  Micro Data:  See micro tab Most recent significant findings:   4/5 trach asp >> Klebsiella pneumoniae 4/12 Pelvic abscess >> ampicillin resistant Klebsiella pneumoniae 4/12 BCx> no growth   5/1 blood cultures negative 5/3 resp culture > negative  Antimicrobials:  Zosyn 3/21 > 3/29; 4/5 >4/19; 5/1 >  Eraxis 3/23> 3/29, 4/12>4/19 5/3 > 5/7 Vanco 5/3 > 5/5   Interim History / Subjective:  No overnight issues, remained afebrile, white count is trending down Vasopressin was turned off, patient continued require phenylephrine Continue to complain of lower abdominal pain, stated fentanyl helps with the pain for short period  Objective   Blood pressure 103/61, pulse 72, temperature 98.4 F (36.9 C), temperature source Axillary, resp. rate (!) 22, height 6\' 2"  (1.88 m), weight 98.3 kg, SpO2 100 %. CVP:  [6 mmHg-16 mmHg] 14 mmHg  Vent Mode: PRVC FiO2 (%):  [40 %] 40 % Set Rate:  [24 bmp] 24 bmp Vt Set:  [650 mL] 650 mL PEEP:  [5 cmH20] 5 cmH20 Pressure Support:  [15 cmH20] 15 cmH20 Plateau Pressure:  [24 cmH20-27 cmH20] 24 cmH20   Intake/Output Summary (Last 24 hours) at 11/19/2020 0745 Last data filed at 11/19/2020 0700 Gross per 24 hour  Intake 4277.88 ml  Output 4576 ml  Net -298.12 ml   Filed Weights   11/17/20 0500 11/18/20 0500 11/19/20 0500  Weight: 99.7 kg 99.4 kg 98.3 kg   Physical Exam: General: Chronically critically ill patient, s/p trach to vent Neuro: awake, alert, follows  commands, diffusely weak Cardiovascular:  RRR, no mrg Lungs:  Clear bilateral breath sounds, no wheezes or rhonchi Abdomen: soft, wound vac present, multiple drains Extremities: Bilateral 3+ pitting edema  Personally reviewed labs and imaging Na 135, K 3.9, Cr 1.4, WBC 13, Hgb 7.4  Resolved Hospital Problem list   Hemorrhagic shock Klebsiella pneumoniae pneumonia Acute hypoxemic respiratory failure requiring  mechanical ventilation  Assessment & Plan:   Acute respiratory failure with hypoxia > worsened due to HCAP S/p Tracheostomy  Continue mechanical vent support Currently on 40% FiO2 2 oh with PEEP of 5 PST as tolerated VAP prevention SpO2 goal > 90%  PAF/Atrial Flutter Systolic heart failure  ICD in place Continue tele Back in NSR  Circulatory vs Septic Shock Off stress steroids as of 5/5 Vasopressin was turned off on 5/10 Patient is still requiring phenylephrine to maintain MAP of 65 Continue phenylephrine, per family discussion we will not add a second vasopressors if his MAP drops   HCAP > still no organism identified Multiple abdominal abscesses Possible fungemia Eraxis was stopped on 5/7 after 7 days therapy. Low threshold to resume for clinical worsening given on TPN as well Leukocytosis improving after stopping steroids.  He remained afebrile Continue zosyn day 11, will continue for total 2 weeks therapy  S/p near complete small bowel resection: not a candidate for small bowel transplant currently TPN per pharmacy and [facility]  AKI now on HD Severe metabolic acidosis: most likely due to hypoperfusion from septic shock Continue CRRT  Hyperbilirubinemia Patient total bilirubin went up to 13, could be due to hepatic injury from hypotension or absorption of bilirubin from peritoneal cavity Closely monitor  Daily Goals Checklist  Per primary Code Status: DNR. Global: remains critically ill. Continue vasopressors. I still think the likelihood he would be able to achieve ambulatory status for small bowel evaluation is quite low. We will continue to follow for critical care needs.     Jacky Kindle MD [facility] See Amion for pager If no response to pager, please call [phone] until 7pm After 7pm, Please call E-link [phone]

## 2020-11-19 NOTE — Progress Notes (Signed)
CSW received consult to speak with family about financial assistance. CSW contacted patient's brother, Mr. [name] [phone]), however he was not aware of any concerns but that he will ask the rest of the family and contact CSW back if needed.   Chandy Tarman LCSW

## 2020-11-19 NOTE — Progress Notes (Signed)
PHARMACY - TOTAL PARENTERAL NUTRITION CONSULT NOTE  Indication:  Short bowel syndrome  Patient Measurements: (updated) Height: 6\' 2"  (188 cm) Weight: 97.4 kg (214 lb 11.7 oz) IBW/kg (Calculated) : 82.2 TPN AdjBW (KG): 95.3 Body mass index is 27.57 kg/m.  Assessment:  89 YOM with history of mass adjacent to head of the pancreas in 2021. 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  Developed cholecystitis in Feb 2022 and had percutaneous cholecystectomy tube placement.  Presented this admission for neuroendocrine tumor resection with Whipple procedure, also had right total colectomy, colostomy and cholecystectomy on 10-13-2020. SMV was lacerated during procedure and repaired by bovine pericardial patch. Developed hemorrhagic shock with bile leak post-op and underwent placement of biliary T tube with abdominal washout on 3/15 PM. Antimicrobial therapies discontinued 4/20. Pharmacy consulted to manage TPN.   Glucose / Insulin: no hx DM, A1c 5.2%. CBGs 100-120. Stress steroids d/c'd again 5/5. Utilized 2 units mSSI in last 24 hours + 65 units in TPN. Will continue to reduce in TPN given lower CBG trends Electrolytes: Transitioned back to CRRT on 5/3 - K 4 on 4K CRRT bags, Phos 4.3, corrected Ca 10.5, Mag 2.3 Renal: CRRT 4/9-4/11 > iHD > back to CRRT on 5/3  Hepatic: LFTs mildly elevated - stable, Tbili up to 13 on 5/9, down to 12.5 on 5/10 - will monitor closely and recheck with 5/12 labs - hesitate to re-attempt TPN cycling while on CRRT, TG 257 (5/9) << 193 (5/6). Albumin 1.3, prealbumin up to 16.5 (5/9) << 7.8 (5/2) TG peaked at 740 (3/26) Intake / Output; MIVF: none to minimal UOP, drain output (5 total): ~1500 mL, net +11.8L GI Imaging: 4/6 CT - pelvic fluid collection concerning for abscess 4/12 CT - no new fluid collections 4/28 CT - new fluid+gas collection in RLQ GI Surgeries / Procedures: 3/14 Whipple procedure with R hemicolectomy and end ileostomy,  cholecystectomy 3/15 washout, VAC placement 3/17 washout, VAC replacement.  Necrosis of entire small bowel except for the PB limb and proximal 40-50cm of jejunum 3/19 re-opening laparotomy, abd closure.  Necrotic SB. 3/21 SB resection, take down of choledochojejunal anastomosis and pancreaticojejunal anastomosis, take down of duodenojejunal anastomosis, placement of externalized biliary drain / Stamm gastrostomy tube, IA washout and placement of intraperitoneal drains 3/30 extubation> 4/8 trach 4/12 left lateral abdominal drain placed by IR  Central access: R IJ CVC placed 3/15; changed to PICC placed 10/03/20 TPN start date: 10/01/2020  Nutritional Goals (RD recommendation on 5/5): 2800-3000 kCal, 185-210g AA, fluid >2L/day  Lipid days (MWF): 3340 kcal with 205g protein, 540g CHO, 68g SMOF lipid NON-lipid days (TThSaSun): 2657kcal with 205g protein, 540g CHO  Current Nutrition:  TPN; NPO  Plan: - Continue TPN at goal rate 150 mL/hr at 1800 - Continue 19g/L SMOF lipids on MWF to prevent EFAD due to elevated TG - TPN will provide weekly average of 2949 kCal and 205g AA per day, meeting 100% of needs - Electrolytes in TPN: Na 142mEq/L, K 15 mEq/L, cont Ca at 2 mEq/L, cont Mg to 2 mEq/L, red Phos to 17 mmol/L (while on CRRT), Cl:Ac 1:1 - Add daily multivitamin to TPN. Remove standard trace elements and add back zinc 5mg  and selenium 17mcg. Remove chromium while on RRT. - Continue mSSI and reduce to 55 units insulin regular in TPN and adjust as needed  - F/u TPN labs. Watch TG trend closely, daily RFP w/ CRRT, recheck TBili with 5/12 labs  Thank you for  allowing pharmacy to be a part of this patient's care.  Alycia Rossetti, PharmD, BCPS Clinical Pharmacist Clinical phone for 11/19/2020: [phone] 11/19/2020 10:03 AM   **Pharmacist phone directory can now be found on [email or web address] ([email or web address]).  Listed under [facility].

## 2020-11-19 NOTE — Progress Notes (Signed)
[facility] NEPHROLOGY PROGRESS NOTE  Assessment/ Plan: Pt is a 50 y.o. yo male with neuroendocrine tumor status post Whipple procedure right colectomy and SMV repair, developed AKI on dialysis.  Extensive dead small bowel requiring resection, TPN dependent- in hospital close to 60 days  #AKI RRT dependent, prolonged, since 4/9. ATN.  No evidence of recovery and sig decompensation 5/3 req back on CRRT.  All 4K, slight neg UF, no anticoagulation.  Grim prognosis.  Palliative working with with family. Replete P if < 3.  vascath in 31 days-  Family understands it is only a matter of time until this line gets infected  #Acute respiratory failure required mechanical ventilation. Back on Vent, per CCM. Liekly HCAP; slowly improving  #Neuroendocrine tumor status post Whipple procedure with extensive small bowel necrosis status post resection.  TPN dependent  #Severe protein calorie malnutrition, hypoalbuminemia; TPN dependent  #Anemia: Hemoglobin stable.  Transfuse PRBC as needed.  Per CCM and CCS  #Hypotension: Remains on pressors-  Plans to not escalate dosing-  He has edema but is third spacing so have backed off on UF to keep even   # Hyperkalemia, resolved  # metabolic and resp acidosis, as above.    Poor prognosis-   family meeting -  Not actively withdrawing but not escalating   Subjective:   No interval events- surgery discussion with family-  They will not withdraw due to religious reasons-  Plan is to not escalate care  K 3.9, phosphorus 4.3  Remains on pressors - not escalating  All 4K bath  Ran even yesterday, anuric  Objective Vital signs in last 24 hours: Vitals:   11/19/20 0700 11/19/20 0740 11/19/20 0800 11/19/20 0900  BP: 103/61 (!) 106/41 (!) 97/56 (!) 87/37  Pulse: 72 73 74 69  Resp: (!) 22 20 19 19   Temp:   98.2 F (36.8 C)   TempSrc:   Axillary   SpO2: 100% 100% 100% 100%  Weight:      Height:       Weight change: -1.1 kg  Intake/Output  Summary (Last 24 hours) at 11/19/2020 0917 Last data filed at 11/19/2020 0900 Gross per 24 hour  Intake 4262.46 ml  Output 4778 ml  Net -515.54 ml       Labs: Basic Metabolic Panel: Recent Labs  Lab 11/18/20 0457 11/18/20 1626 11/19/20 0509  NA 134* 136 135  K 4.0 4.0 3.9  CL 104 101 101  CO2 24 25 24   GLUCOSE 125* 121* 104*  BUN 38* 36* 36*  CREATININE 1.42* 1.36* 1.43*  CALCIUM 8.0* 8.2* 8.3*  PHOS 3.6 4.3 4.3   Liver Function Tests: Recent Labs  Lab 11/14/20 0508 11/14/20 1613 11/17/20 0530 11/17/20 1608 11/18/20 0457 11/18/20 1626 11/19/20 0509  AST 105*  --  96*  --  104*  --   --   ALT 189*  --  124*  --  123*  --   --   ALKPHOS 101  --  131*  --  125  --   --   BILITOT 6.5*  --  13.0*  --  12.5*  --   --   PROT 6.4*  --  6.5  --  6.3*  --   --   ALBUMIN 1.2*   < > 1.3*   < > 1.3*  1.2* 1.3* 1.2*   < > = values in this interval not displayed.   No results for input(s): LIPASE, AMYLASE in the last 168 hours. No  results for input(s): AMMONIA in the last 168 hours. CBC: Recent Labs  Lab 11/13/20 0430 11/13/20 0844 11/14/20 0508 11/15/20 0455 11/16/20 0442 11/17/20 0530 11/18/20 0457 11/19/20 0509  WBC 13.7* 13.8*   < > 20.4* 19.8* 16.3* 14.1* 13.0*  NEUTROABS 12.5* 11.7*  --   --   --  8.7*  --   --   HGB 8.4* 8.1*   < > 8.1* 7.5* 7.6* 7.5* 7.4*  HCT 26.5* 25.0*   < > 25.1* 24.7* 25.0* 24.0* 24.8*  MCV 80.1 79.4*   < > 80.4 84.0 84.7 85.4 87.0  PLT 162 160   < > 174 144* 128* 129* 122*   < > = values in this interval not displayed.   Cardiac Enzymes: No results for input(s): CKTOTAL, CKMB, CKMBINDEX, TROPONINI in the last 168 hours. CBG: Recent Labs  Lab 11/18/20 1607 11/18/20 1955 11/19/20 0006 11/19/20 0356 11/19/20 0800  GLUCAP 123* 104* 111* 101* 100*    Iron Studies: No results for input(s): IRON, TIBC, TRANSFERRIN, FERRITIN in the last 72 hours. Studies/Results: No results found.  Medications: Infusions: .  prismasol BGK  4/2.5 1,000 mL/hr at 11/19/20 0526  .  prismasol BGK 4/2.5 500 mL/hr at 11/18/20 2150  . sodium chloride Stopped (11/19/20 0819)  . sodium chloride Stopped (11/08/20 1119)  . sodium chloride    . phenylephrine (NEO-SYNEPHRINE) Adult infusion 60 mcg/min (11/19/20 0900)  . piperacillin-tazobactam Stopped (11/19/20 0816)  . prismasol BGK 4/2.5 1,500 mL/hr at 11/19/20 0908  . promethazine (PHENERGAN) injection (IM or IVPB) Stopped (11/19/20 0554)  . TPN ADULT (ION) 150 mL/hr at 11/19/20 0900    Scheduled Medications: . chlorhexidine gluconate (MEDLINE KIT)  15 mL Mouth Rinse BID  . Chlorhexidine Gluconate Cloth  6 each Topical Q0600  . heparin injection (subcutaneous)  5,000 Units Subcutaneous Q8H  . insulin aspart  0-15 Units Subcutaneous Q4H  . mouth rinse  15 mL Mouth Rinse 10 times per day  . pantoprazole (PROTONIX) IV  40 mg Intravenous BID  . sodium chloride flush  10-40 mL Intracatheter Q12H  . sodium chloride flush  5 mL Intracatheter Q8H    have reviewed scheduled and prn medications.  Physical Exam: General: Lying in bed, on vent, somnolent but can answer questions Heart:RRR, s1s2 nl Lungs: Clear anteriorly Abdomen:soft, non-distended Extremities:  LE dep edema- third spacing  Dialysis Access: IJ HD temp catheter in place for 31 days   Taneesha Edgin A Severina Sykora 11/19/2020,9:17 AM  LOS: 58 days

## 2020-11-19 NOTE — Progress Notes (Signed)
CSW received call from patient's family member, Mr. Dewain Penning. He requested assistance with FMLA paperwork that Dr. Zenia Resides has.  CSW spoke with Dr. Zenia Resides and she confirmed she spoke with the family and alerted them that they needed to take the paperwork to surgery's main office to complete but that she does not have any paperwork.   CSW contacted Mr. Dewain Penning back and provided [employer] [facility]'s contact information and instructed him to contact patient's workplace for the paperwork. He reported understanding.   Sonni Barse LCSW

## 2020-11-20 DIAGNOSIS — Z95810 Presence of automatic (implantable) cardiac defibrillator: Secondary | ICD-10-CM | POA: Diagnosis not present

## 2020-11-20 DIAGNOSIS — J9601 Acute respiratory failure with hypoxia: Secondary | ICD-10-CM | POA: Diagnosis not present

## 2020-11-20 DIAGNOSIS — D3A8 Other benign neuroendocrine tumors: Secondary | ICD-10-CM | POA: Diagnosis not present

## 2020-11-20 DIAGNOSIS — I480 Paroxysmal atrial fibrillation: Secondary | ICD-10-CM | POA: Diagnosis not present

## 2020-11-20 DIAGNOSIS — I472 Ventricular tachycardia: Secondary | ICD-10-CM | POA: Diagnosis not present

## 2020-11-20 LAB — RENAL FUNCTION PANEL
Albumin: 1.2 g/dL — ABNORMAL LOW (ref 3.5–5.0)
Anion gap: 8 (ref 5–15)
BUN: 38 mg/dL — ABNORMAL HIGH (ref 6–20)
CO2: 25 mmol/L (ref 22–32)
Calcium: 8.2 mg/dL — ABNORMAL LOW (ref 8.9–10.3)
Chloride: 103 mmol/L (ref 98–111)
Creatinine, Ser: 1.57 mg/dL — ABNORMAL HIGH (ref 0.61–1.24)
GFR, Estimated: 54 mL/min — ABNORMAL LOW (ref >60)
Glucose, Bld: 119 mg/dL — ABNORMAL HIGH (ref 70–99)
Phosphorus: 4.5 mg/dL (ref 2.5–4.6)
Potassium: 4.3 mmol/L (ref 3.5–5.1)
Sodium: 136 mmol/L (ref 135–145)

## 2020-11-20 LAB — COMPREHENSIVE METABOLIC PANEL
ALT: 142 U/L — ABNORMAL HIGH (ref 0–44)
AST: 142 U/L — ABNORMAL HIGH (ref 15–41)
Albumin: 1.2 g/dL — ABNORMAL LOW (ref 3.5–5.0)
Alkaline Phosphatase: 115 U/L (ref 38–126)
Anion gap: 8 (ref 5–15)
BUN: 37 mg/dL — ABNORMAL HIGH (ref 6–20)
CO2: 24 mmol/L (ref 22–32)
Calcium: 8.2 mg/dL — ABNORMAL LOW (ref 8.9–10.3)
Chloride: 104 mmol/L (ref 98–111)
Creatinine, Ser: 1.52 mg/dL — ABNORMAL HIGH (ref 0.61–1.24)
GFR, Estimated: 56 mL/min — ABNORMAL LOW (ref >60)
Glucose, Bld: 114 mg/dL — ABNORMAL HIGH (ref 70–99)
Potassium: 4.1 mmol/L (ref 3.5–5.1)
Sodium: 136 mmol/L (ref 135–145)
Total Bilirubin: 13.1 mg/dL — ABNORMAL HIGH (ref 0.3–1.2)
Total Protein: 6.8 g/dL (ref 6.5–8.1)

## 2020-11-20 LAB — GLUCOSE, CAPILLARY
Glucose-Capillary: 116 mg/dL — ABNORMAL HIGH (ref 70–99)
Glucose-Capillary: 106 mg/dL — ABNORMAL HIGH (ref 70–99)
Glucose-Capillary: 113 mg/dL — ABNORMAL HIGH (ref 70–99)
Glucose-Capillary: 108 mg/dL — ABNORMAL HIGH (ref 70–99)
Glucose-Capillary: 111 mg/dL — ABNORMAL HIGH (ref 70–99)

## 2020-11-20 LAB — PHOSPHORUS: Phosphorus: 4.1 mg/dL (ref 2.5–4.6)

## 2020-11-20 LAB — CBC
HCT: 24.5 % — ABNORMAL LOW (ref 39.0–52.0)
Hemoglobin: 7.3 g/dL — ABNORMAL LOW (ref 13.0–17.0)
MCH: 26.4 pg (ref 26.0–34.0)
MCHC: 29.8 g/dL — ABNORMAL LOW (ref 30.0–36.0)
MCV: 88.8 fL (ref 80.0–100.0)
Platelets: 108 10*3/uL — ABNORMAL LOW (ref 150–400)
RBC: 2.76 MIL/uL — ABNORMAL LOW (ref 4.22–5.81)
RDW: 25.9 % — ABNORMAL HIGH (ref 11.5–15.5)
WBC: 12.3 10*3/uL — ABNORMAL HIGH (ref 4.0–10.5)
nRBC: 2 % — ABNORMAL HIGH (ref 0.0–0.2)

## 2020-11-20 LAB — MAGNESIUM: Magnesium: 2.3 mg/dL (ref 1.7–2.4)

## 2020-11-20 MED ORDER — FENTANYL 2500MCG IN NS 250ML (10MCG/ML) PREMIX INFUSION
50.0000 ug/h | INTRAVENOUS | Status: DC
Start: 2020-11-20 — End: 2020-11-24
  Administered 2020-11-20: 50 ug/h via INTRAVENOUS
  Administered 2020-11-21 – 2020-11-23 (×4): 150 ug/h via INTRAVENOUS
  Administered 2020-11-24: 175 ug/h via INTRAVENOUS
  Filled 2020-11-20 (×6): qty 250

## 2020-11-20 MED ORDER — ZINC CHLORIDE 1 MG/ML IV SOLN
INTRAVENOUS | Status: AC
  Filled 2020-11-20: qty 2052

## 2020-11-20 MED ORDER — ZINC CHLORIDE 1 MG/ML IV SOLN
INTRAVENOUS | Status: DC
  Filled 2020-11-20: qty 2052

## 2020-11-20 NOTE — Progress Notes (Signed)
34 Days Post-Op  Subjective: On neo at 150 this morning. Afebrile. Patient is having a lot of pain with any movements.  Objective: Vital signs in last 24 hours: Temp:  [97.8 F (36.6 C)-98.8 F (37.1 C)] 98.8 F (37.1 C) (05/12 0300) Pulse Rate:  [62-89] 89 (05/12 0700) Resp:  [16-28] 22 (05/12 0700) BP: (77-117)/(37-63) 93/50 (05/12 0700) SpO2:  [95 %-100 %] 95 % (05/12 0700) Arterial Line BP: (98-204)/(34-128) 98/40 (05/12 0700) FiO2 (%):  [40 %] 40 % (05/12 0400) Weight:  [98 kg] 98 kg (05/12 0500) Last BM Date:  (pta)  Intake/Output from previous day: 05/11 0701 - 05/12 0700 In: 4249.5 [I.V.:3949.1; IV Piggyback:300.4] Out: 4815 [Drains:1490] Intake/Output this shift: No intake/output data recorded.  PE: General: resting comfortably, NAD HEENT: trach in place, site is clean Resp: trach in place, on vent Vent Mode: PRVC FiO2 (%):  [40 %] 40 % Set Rate:  [24 bmp] 24 bmp Vt Set:  [650 mL] 650 mL PEEP:  [5 cmH20] 5 cmH20 Pressure Support:  [15 cmH20] 15 cmH20 Plateau Pressure:  [25 cmH20-26 cmH20] 26 cmH20 CV: RRR Abdomen: soft, nondistended, vac in place on midline wound. RUQ JP x1 with bilious fluid. Biliary drain with bile. LUQ perc drain to gravity drainage with purulent fluid. Pancreatic stent draining clear colorless fluid. G tube in place to gravity drainage, draining clear nonbloody gastric contents. Extremities: warm and well-perfused  Lab Results:  Recent Labs    11/19/20 0509 11/20/20 0500  WBC 13.0* 12.3*  HGB 7.4* 7.3*  HCT 24.8* 24.5*  PLT 122* 108*   BMET Recent Labs    11/19/20 1600 11/20/20 0500  NA 136 136  K 4.3 4.1  CL 104 104  CO2 24 24  GLUCOSE 112* 114*  BUN 37* 37*  CREATININE 1.51* 1.52*  CALCIUM 8.4* 8.2*   PT/INR No results for input(s): LABPROT, INR in the last 72 hours. CMP     Component Value Date/Time   NA 136 11/20/2020 0500   NA 142 07/21/2020 0921   K 4.1 11/20/2020 0500   CL 104 11/20/2020 0500   CO2 24  11/20/2020 0500   GLUCOSE 114 (H) 11/20/2020 0500   BUN 37 (H) 11/20/2020 0500   BUN 16 07/21/2020 0921   CREATININE 1.52 (H) 11/20/2020 0500   CREATININE 1.16 03/01/2016 1607   CALCIUM 8.2 (L) 11/20/2020 0500   PROT 6.8 11/20/2020 0500   PROT 6.7 07/21/2020 0921   ALBUMIN 1.2 (L) 11/20/2020 0500   ALBUMIN 4.1 07/21/2020 0921   AST 142 (H) 11/20/2020 0500   ALT 142 (H) 11/20/2020 0500   ALKPHOS 115 11/20/2020 0500   BILITOT 13.1 (H) 11/20/2020 0500   BILITOT 0.8 07/21/2020 0921   GFRNONAA 56 (L) 11/20/2020 0500   GFRNONAA >89 09/22/2015 1008   GFRAA 82 07/21/2020 0921   GFRAA >89 09/22/2015 1008       Assessment/Plan 50 yo male 6 weeks s/p Whipple, right colectomy and SMV repair for T3N0 neuroendocrine tumor. C/b postop hemorrhagic shock with DIC and subsequent diffuse small bowel necrosis, now s/p small bowel resection with external drainage of bile duct, pancreatic duct and stomach. - Antibiotics for 2 week course - CRRT - TPN - Begin fentanyl infusion to achieve better pain control. Continue prn boluses. - Zofran and phenergan for nausea. Place NG if needed for symptom management if patient has persistent emesis. - Ok to continue neo to maintain BP, but no additional pressors should be added as this  would not change the patient's outcome at this point. He remains in multiorgan failure with no signs of improvement in the last week. - VTE: SQH - Dispo: ICU. Continue current care with no further escalation. Maintain DNR status.   LOS: 79 days    Michaelle Birks, MD [facility] General, Hepatobiliary and Pancreatic Surgery 11/20/20 7:34 AM

## 2020-11-20 NOTE — Progress Notes (Signed)
PHARMACY - TOTAL PARENTERAL NUTRITION CONSULT NOTE  Indication:  Short bowel syndrome  Patient Measurements: (updated) Height: 6\' 2"  (188 cm) Weight: 97.4 kg (214 lb 11.7 oz) IBW/kg (Calculated) : 82.2 TPN AdjBW (KG): 95.3 Body mass index is 27.57 kg/m.  Assessment:  43 YOM with history of mass adjacent to head of the pancreas in 2021. 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  Developed cholecystitis in Feb 2022 and had percutaneous cholecystectomy tube placement.  Presented this admission for neuroendocrine tumor resection with Whipple procedure, also had right total colectomy, colostomy and cholecystectomy on 09/09/2020. SMV was lacerated during procedure and repaired by bovine pericardial patch. Developed hemorrhagic shock with bile leak post-op and underwent placement of biliary T tube with abdominal washout on 3/15 PM. Antimicrobial therapies discontinued 4/20. Pharmacy consulted to manage TPN.   Glucose / Insulin: no hx DM, A1c 5.2%. CBGs 100-120. Stress steroids d/c'd again 5/5. Utilized 2 units mSSI in last 24 hours + 65 units in TPN. Will continue to reduce in TPN given lower CBG trends Electrolytes: Transitioned back to CRRT on 5/3 - K 4.1 on 4K CRRT bags, Phos 4.1, corrected Ca 10.5, Mag 2.3 Renal: CRRT 4/9-4/11 > iHD > back to CRRT on 5/3  Hepatic: LFTs mildly elevated - slight uptrend to 142/142, Tbili noted to jump up to 13 on 5/9, remains elevated at 13.1 today - will monitor closely and recheck with 5/14 labs - hesitate to re-attempt TPN cycling while on CRRT, TG 257 (5/9) << 193 (5/6). Albumin 1.3, prealbumin up to 16.5 (5/9) << 7.8 (5/2) TG peaked at 740 (3/26) Intake / Output; MIVF: none to minimal UOP, drain output (5 total): ~1500 mL, net +11.8L GI Imaging: 4/6 CT - pelvic fluid collection concerning for abscess 4/12 CT - no new fluid collections 4/28 CT - new fluid+gas collection in RLQ GI Surgeries / Procedures: 3/14 Whipple procedure with R  hemicolectomy and end ileostomy, cholecystectomy 3/15 washout, VAC placement 3/17 washout, VAC replacement.  Necrosis of entire small bowel except for the PB limb and proximal 40-50cm of jejunum 3/19 re-opening laparotomy, abd closure.  Necrotic SB. 3/21 SB resection, take down of choledochojejunal anastomosis and pancreaticojejunal anastomosis, take down of duodenojejunal anastomosis, placement of externalized biliary drain / Stamm gastrostomy tube, IA washout and placement of intraperitoneal drains 3/30 extubation> 4/8 trach 4/12 left lateral abdominal drain placed by IR  Central access: R IJ CVC placed 3/15; changed to PICC placed 10/03/20 TPN start date: October 15, 2020  Nutritional Goals (RD recommendation on 5/5): 2800-3000 kCal, 185-210g AA, fluid >2L/day  Lipid days (MWF): 3340 kcal with 205g protein, 540g CHO, 68g SMOF lipid NON-lipid days (TThSaSun): 2657kcal with 205g protein, 540g CHO  Current Nutrition:  TPN; NPO  Plan: - Continue TPN at goal rate 150 mL/hr at 1800 - Continue 19g/L SMOF lipids on MWF to prevent EFAD due to elevated TG - TPN will provide weekly average of 2949 kCal and 205g AA per day, meeting 100% of needs - Electrolytes in TPN: Na 11mEq/L, K 15 mEq/L, cont Ca at 2 mEq/L, cont Mg to 2 mEq/L, red Phos to 15 mmol/L (while on CRRT), Cl:Ac 1:1 - Add daily multivitamin to TPN. Remove standard trace elements and add back zinc 5mg  and selenium 41mcg. Remove chromium while on RRT. - Continue mSSI and reduce to 45 units insulin regular in TPN and adjust as needed  - F/u TPN labs. Watch TG trend closely, daily RFP w/ CRRT, recheck TBili with  5/14 labs  Thank you for allowing pharmacy to be a part of this patient's care.  Alycia Rossetti, PharmD, BCPS Clinical Pharmacist Clinical phone for 11/20/2020: [phone] 11/20/2020 11:13 AM   **Pharmacist phone directory can now be found on [email or web address] ([email or web address]).  Listed under [facility].

## 2020-11-20 NOTE — Progress Notes (Signed)
[facility] NEPHROLOGY PROGRESS NOTE  Assessment/ Plan: Pt is a 50 y.o. yo male with neuroendocrine tumor status post Whipple procedure right colectomy and SMV repair, developed AKI on dialysis.  Extensive dead small bowel requiring resection, TPN dependent- in hospital close to 60 days  #AKI RRT dependent, prolonged, since 4/9. ATN.  No evidence of recovery and sig decompensation 5/3 req back on CRRT.  All 4K, slight neg UF, no anticoagulation.  Grim prognosis.  Palliative working with with family. Replete P if < 3.  vascath in 32 days-  Family understands it is only a matter of time until this line gets infected  #Acute respiratory failure required mechanical ventilation. Back on Vent, per CCM. Likely HCAP  #Neuroendocrine tumor status post Whipple procedure with extensive small bowel necrosis status post resection.  TPN dependent  #Severe protein calorie malnutrition, hypoalbuminemia; TPN dependent  #Anemia: Hemoglobin stable.  Transfuse PRBC as needed.  Per CCM and CCS  #Hypotension: Remains on pressors-  Plans to not escalate dosing-  He has edema but is third spacing so have backed off on UF to keep even   # Hyperkalemia, resolved  # metabolic and resp acidosis, as above.    Poor prognosis-   family meeting -  Not actively withdrawing but not escalating   Subjective:   More discomfort-  Narcotics increased-  req inc pressor support as well   K 4.1, phosphorus 4.1  Remains on pressors - not escalating  All 4K bath  Ran basically even yesterday, anuric  Objective Vital signs in last 24 hours: Vitals:   11/20/20 0700 11/20/20 0800 11/20/20 0827 11/20/20 0828  BP: (!) 93/50 (!) 108/58 (!) 108/58   Pulse: 89 76 76   Resp: (!) 22 (!) 25 (!) 24   Temp:  98.6 F (37 C)    TempSrc:  Oral    SpO2: 95% 95% 96% 97%  Weight:      Height:       Weight change: -0.3 kg  Intake/Output Summary (Last 24 hours) at 11/20/2020 0853 Last data filed at 11/20/2020  0800 Gross per 24 hour  Intake 4248.5 ml  Output 4558 ml  Net -309.5 ml       Labs: Basic Metabolic Panel: Recent Labs  Lab 11/19/20 0509 11/19/20 1600 11/20/20 0500  NA 135 136 136  K 3.9 4.3 4.1  CL 101 104 104  CO2 24 24 24   GLUCOSE 104* 112* 114*  BUN 36* 37* 37*  CREATININE 1.43* 1.51* 1.52*  CALCIUM 8.3* 8.4* 8.2*  PHOS 4.3 5.2* 4.1   Liver Function Tests: Recent Labs  Lab 11/17/20 0530 11/17/20 1608 11/18/20 0457 11/18/20 1626 11/19/20 0509 11/19/20 1600 11/20/20 0500  AST 96*  --  104*  --   --   --  142*  ALT 124*  --  123*  --   --   --  142*  ALKPHOS 131*  --  125  --   --   --  115  BILITOT 13.0*  --  12.5*  --   --   --  13.1*  PROT 6.5  --  6.3*  --   --   --  6.8  ALBUMIN 1.3*   < > 1.3*  1.2*   < > 1.2* 1.3* 1.2*   < > = values in this interval not displayed.   No results for input(s): LIPASE, AMYLASE in the last 168 hours. No results for input(s): AMMONIA in the last 168 hours.  CBC: Recent Labs  Lab 11/16/20 0442 11/17/20 0530 11/18/20 0457 11/19/20 0509 11/20/20 0500  WBC 19.8* 16.3* 14.1* 13.0* 12.3*  NEUTROABS  --  8.7*  --   --   --   HGB 7.5* 7.6* 7.5* 7.4* 7.3*  HCT 24.7* 25.0* 24.0* 24.8* 24.5*  MCV 84.0 84.7 85.4 87.0 88.8  PLT 144* 128* 129* 122* 108*   Cardiac Enzymes: No results for input(s): CKTOTAL, CKMB, CKMBINDEX, TROPONINI in the last 168 hours. CBG: Recent Labs  Lab 11/19/20 1538 11/19/20 2006 11/19/20 2344 11/20/20 0403 11/20/20 0811  GLUCAP 107* 118* 117* 116* 106*    Iron Studies: No results for input(s): IRON, TIBC, TRANSFERRIN, FERRITIN in the last 72 hours. Studies/Results: No results found.  Medications: Infusions: .  prismasol BGK 4/2.5 1,000 mL/hr at 11/20/20 0835  .  prismasol BGK 4/2.5 500 mL/hr at 11/20/20 0251  . sodium chloride Stopped (11/20/20 0749)  . sodium chloride Stopped (11/08/20 1119)  . sodium chloride    . fentaNYL infusion INTRAVENOUS 50 mcg/hr (11/20/20 0821)  .  phenylephrine (NEO-SYNEPHRINE) Adult infusion 150 mcg/min (11/20/20 0800)  . piperacillin-tazobactam 100 mL/hr at 11/20/20 0800  . prismasol BGK 4/2.5 1,500 mL/hr at 11/20/20 0618  . promethazine (PHENERGAN) injection (IM or IVPB) Stopped (11/20/20 0417)  . TPN ADULT (ION) 150 mL/hr at 11/20/20 0800    Scheduled Medications: . chlorhexidine gluconate (MEDLINE KIT)  15 mL Mouth Rinse BID  . Chlorhexidine Gluconate Cloth  6 each Topical Q0600  . heparin injection (subcutaneous)  5,000 Units Subcutaneous Q8H  . insulin aspart  0-15 Units Subcutaneous Q4H  . mouth rinse  15 mL Mouth Rinse 10 times per day  . pantoprazole (PROTONIX) IV  40 mg Intravenous BID  . sodium chloride flush  10-40 mL Intracatheter Q12H  . sodium chloride flush  5 mL Intracatheter Q8H    have reviewed scheduled and prn medications.  Physical Exam: General: Lying in bed,  somnolent  Heart:RRR, s1s2 nl Lungs: Clear anteriorly Abdomen:soft, non-distended Extremities:  LE dep edema- third spacing  Dialysis Access: IJ HD temp catheter in place for 32 days   Yahel Fuston A Haidyn Kilburg 11/20/2020,8:53 AM  LOS: 59 days

## 2020-11-20 NOTE — Progress Notes (Signed)
[name], MRN:  [id number], DOB:  05/04/1971, LOS: 73 ADMISSION DATE:  09/26/2020, CONSULTATION DATE:  11/20/20 REFERRING MD:  Anesthesia, CHIEF COMPLAINT:  Whipple, post-op shock   Brief History:  50 y.o. M with PMH of neuroendocrine tumor and underwent Whipple procedure 3/14 with intra-operative SMV laceration and repair by vascular with bovine pericardial patch.  Pt had worsening shock overnight and required three pressors despite massive transfusion protocol (received 150 blood products).  He was taken back to the OR for washout on 3/15 and returned to the ICU intubated.  Taken back 3/17 again found to have small bowel necrosis s/p resection.  Went back for ex-lap on 3/19, noted to have necrosis of nearly entirety of bowel, no bowel resected.  Fascia closed.  Case was discussed with family and [facility] transplant surgery for consideration of eventual small bowel transplant.  Electively returned to the OR on 3/21 for resection of remaining necrotic small bowel removed, G-tube placed, JP drains in place (2). Tracheostomy ws performed by Dr. Bobbye Morton on 4/8.  Started CRRT for AKI on 4/9.  See details below regarding complex hospitalization.  Significant Hospital Events:  3/14: Admit to Surgery, to OR for whipple, SMV injury and repair, massive transfusion protocol overnight  3/15: Back to OR for washout, x2. IR for arteriogram. No major bleed source identified in OR, or arterial bleed w IR. Robust product resuscitation >75 products (+ TXA + novoseven) 3/16: off pressors, add'l 39 products overnight. Slowing resuscitation this morning with hemodynamic improvements and slowing drain output; OR x 2 - washout, biliary t tube, wound vac -- washout, wound vac, IR for arteriogram -- no arterial bleed for embolization 3/17:  Aggressive balanced transfusion continue overnight with marked hemodynamic improvement since yesterday evening. Off pressors x several hours, Wound vac output significantly  slowing (from 568ml q15-68min to 563ml q1hr+) and output is much thinner, Thin bloody secretions from mouth approx 255ml, Decreased RR from 22 to 18, Unable to tolerate NE- severe bradycardia 3/19 taken back to OR. Small bowel noted to be almost completely necrosed. Patient was closed with plans for goals of car discussion.  3/20 [place] family requests full scope of treatment. Primary team discussed with [facility] the possibility of transfer for small bowel transplant. [facility] felt as though transfer would not change outcome. 3/21 Ongoing discussion with family and [facility]. Patient may be a candidate for transplant if he can stabilize post a small bowel resection. He was taken back to the OR and small bowel was resected.  3/23 weaning pressors. Versed off. Remains encephalopathic 3/24 off pressors. Low dose dilaudid gtt -- only weakly grimaces to pain. Changing sedation to precedex + PRN fentanyl. Long discussion with 2 brothers regarding clinical case -- tried to clarify that while the term "stable" has been used, in this instance is meaning that he has not declined from previous shift but is in fact still critically ill with multisystem organ dysfunction.  3/25 Cr and BUN increased. Off sedation  3/26 Pressors off, CT head benign, BUN/Cr continue to creep up 3/27 Tachycardia/HTN overnight improved with fentanyl gtt 3/28 PCCM sign off. Trauma to take over vent/CCM needs.  3/30 extubated 4/2 desaturated overnight to the 30s improved with BVM and NTS.  4/5 poor airway protection. PCCM consulted for re-intubation.  4/6 CT a/p Large RLL opacity, smaller LLL opacity. Post op changes. 5.4cm fluid collection in posterior pelvis, concerning for abscess. Started on Zosyn  4/7 got transfused overnight and then diuresed. Worse renal  function 4/8 Tracheostomy performed by general surgery 4/9 transfused again. HD line inserted and started on CRRT 4/10 started on trach collar trials. 4/12 off CRRT and transitioned to IHD.  Surgery placed abdominal drain with minimal output-- during procedure seemed c/w hematoma however since grew out klebsiella  4/13, 4/14 Tolerated dialysis 4/15 Placed on vent overnight for rest 4/16 Tolerating trach collar 4/17 got HD. Trach downsized to 6 cuffless, trach dislodged overnight, replaced with #6 cuffless shiley.  4/18 afebrile, trach collar + PMV. Up to chair with PT, PCCM signed off 4/21 decannulated; called back for hypotension, Afib/RVR, increased output from his 5 drains and wound vac.Transfused 2 units, re-cannulated, on Levo and Neo.  ICD fired x2, cardiology consulted 4/24 Continues to tolerate trach collar, 5L 28% 4/29 fever overnight, no complaints, remains on tracheostomy collar 4/30-5/1 fever continued, WBC unchanged, blood cultures sent, zosyn restarted 5/3 worsening shock, hypoxemia due to HCAP, back on ventilator, CRRT started again, made DNR, turned off ICD 5/6 ICD was turned back on 5/9 had a family meeting with Dr. Zenia Resides, decision was to continue current care, do not escalate I.e: No addition of pressors 5/10 Patient came off Vasopressin 5/12 started on fentanyl infusion by surgical team   Consults:  PCCM VVS  IR  Palliative Cardiology Nephrology  Procedures:  PICC 3/25 > JP x 2  Biliary drain Pancreatic stent drain ETT 2/24>>3/13; 3/15 >> 3/29;  4/5>>4/8 Trach 4/8> L lateral abdominal drain 4/12>  Significant Diagnostic Tests:  See radiology tab Most recent significant imaging:  4/12 CT abd/pelvis>>  Unchanged ill-defined fluid collections within the left side of the root of the abdominal mesentery, the ventral aspect of the abdominal mesentery, the subcapsular aspect of the right lobe of the liver and the pelvic cul-de-sac all of which are incompletely evaluated on this noncontrast examination though may represent areas of evolving hematomas giving imaging stability for the past week. Redemonstrated ill-defined stranding surrounding the remaining  portions of the pancreas without definable/drainable fluid collection on this noncontrast examination.  Micro Data:  See micro tab Most recent significant findings:   4/5 trach asp >> Klebsiella pneumoniae 4/12 Pelvic abscess >> ampicillin resistant Klebsiella pneumoniae 4/12 BCx> no growth   5/1 blood cultures negative 5/3 resp culture > negative  Antimicrobials:  Zosyn 3/21 > 3/29; 4/5 >4/19; 5/1 >  Eraxis 3/23> 3/29, 4/12>4/19 5/3 > 5/7 Vanco 5/3 > 5/5   Interim History / Subjective:  Overnight patient became hypotensive, requiring increasing doses of phenylephrine, currently at 150 mics  Objective   Blood pressure (!) 103/49, pulse 77, temperature 98.6 F (37 C), temperature source Oral, resp. rate (!) 21, height 6\' 2"  (1.88 m), weight 98 kg, SpO2 97 %. CVP:  [8 mmHg-15 mmHg] 13 mmHg  Vent Mode: PSV;CPAP FiO2 (%):  [40 %] 40 % Set Rate:  [24 bmp] 24 bmp Vt Set:  [650 mL] 650 mL PEEP:  [5 cmH20] 5 cmH20 Pressure Support:  [15 cmH20] 15 cmH20 Plateau Pressure:  [25 cmH20-26 cmH20] 26 cmH20   Intake/Output Summary (Last 24 hours) at 11/20/2020 0914 Last data filed at 11/20/2020 0900 Gross per 24 hour  Intake 4275.39 ml  Output 4398 ml  Net -122.61 ml   Filed Weights   11/18/20 0500 11/19/20 0500 11/20/20 0500  Weight: 99.4 kg 98.3 kg 98 kg   Physical Exam: General: Chronically critically ill patient, s/p trach on vent Neuro: Awake, alert, follows commands, diffusely weak Cardiovascular:  RRR, no mrg Lungs:  Clear bilateral breath  sounds, no wheezes or rhonchi Abdomen: soft, wound vac present, multiple drains Extremities: Bilateral 3+ pitting edema  Personally reviewed labs and imaging Na 136, K 4.1, Cr 1.52, WBC 12.3, Hgb 7.3  Resolved Hospital Problem list   Hemorrhagic shock Klebsiella pneumoniae pneumonia Acute hypoxemic respiratory failure requiring mechanical ventilation  Assessment & Plan:   Acute respiratory failure with hypoxia > worsened due to  HCAP S/p Tracheostomy  Continue mechanical vent support PST as tolerated VAP prevention SpO2 goal > 90%  PAF/Atrial Flutter Systolic heart failure  ICD in place Continue tele Back in NSR  Circulatory vs Septic Shock Vasopressor requirement increased overnight, currently on 150 of phenylephrine Per family discussion we will not add a second vasopressors if his MAP drops   HCAP > still no organism identified Multiple abdominal abscesses Possible fungemia He remained afebrile Continue zosyn day 12, will continue for total 2 weeks therapy  S/p near complete small bowel resection: not a candidate for small bowel transplant currently TPN per pharmacy and [facility]  AKI now on HD Severe metabolic acidosis: most likely due to hypoperfusion from septic shock Continue CRRT   Daily Goals Checklist  Per primary Code Status: DNR. Global: remains critically ill. Continue vasopressors. I still think the likelihood he would be able to achieve ambulatory status for small bowel evaluation is quite low. We will continue to follow for critical care needs.     Jacky Kindle MD [facility] See Amion for pager If no response to pager, please call [phone] until 7pm After 7pm, Please call E-link [phone]

## 2020-11-20 NOTE — Progress Notes (Signed)
Subjective:  Trach multiple drips Nurse indicates cardiac status stable  Objective:  Vitals:   11/20/20 0700 11/20/20 0800 11/20/20 0827 11/20/20 0828  BP: (!) 93/50 (!) 108/58 (!) 108/58   Pulse: 89 76 76   Resp: (!) 22 (!) 25 (!) 24   Temp:  98.6 F (37 C)    TempSrc:  Oral    SpO2: 95% 95% 96% 97%  Weight:      Height:        Intake/Output from previous day:  Intake/Output Summary (Last 24 hours) at 11/20/2020 0853 Last data filed at 11/20/2020 0800 Gross per 24 hour  Intake 4248.5 ml  Output 4558 ml  Net -309.5 ml    Physical Exam: Critically ill black male Central line Trach Abdominal surgery with drains Trace edema AICD in place   Lab Results: Basic Metabolic Panel: Recent Labs    11/19/20 0509 11/19/20 1600 11/20/20 0500  NA 135 136 136  K 3.9 4.3 4.1  CL 101 104 104  CO2 24 24 24   GLUCOSE 104* 112* 114*  BUN 36* 37* 37*  CREATININE 1.43* 1.51* 1.52*  CALCIUM 8.3* 8.4* 8.2*  MG 2.3  --  2.3  PHOS 4.3 5.2* 4.1   Liver Function Tests: Recent Labs    11/18/20 0457 11/18/20 1626 11/19/20 1600 11/20/20 0500  AST 104*  --   --  142*  ALT 123*  --   --  142*  ALKPHOS 125  --   --  115  BILITOT 12.5*  --   --  13.1*  PROT 6.3*  --   --  6.8  ALBUMIN 1.3*  1.2*   < > 1.3* 1.2*   < > = values in this interval not displayed.   No results for input(s): LIPASE, AMYLASE in the last 72 hours. CBC: Recent Labs    11/19/20 0509 11/20/20 0500  WBC 13.0* 12.3*  HGB 7.4* 7.3*  HCT 24.8* 24.5*  MCV 87.0 88.8  PLT 122* 108*    Imaging: No results found.  Cardiac Studies:  ECG: 5/7 flutter rate 123    Telemetry:  Sinus PAC/PVC no PAF or NSVT  Echo: 5/7 EF 50% mild MR  Medications:   . chlorhexidine gluconate (MEDLINE KIT)  15 mL Mouth Rinse BID  . Chlorhexidine Gluconate Cloth  6 each Topical Q0600  . heparin injection (subcutaneous)  5,000 Units Subcutaneous Q8H  . insulin aspart  0-15 Units Subcutaneous Q4H  . mouth rinse  15 mL  Mouth Rinse 10 times per day  . pantoprazole (PROTONIX) IV  40 mg Intravenous BID  . sodium chloride flush  10-40 mL Intracatheter Q12H  . sodium chloride flush  5 mL Intracatheter Q8H     .  prismasol BGK 4/2.5 1,000 mL/hr at 11/20/20 0835  .  prismasol BGK 4/2.5 500 mL/hr at 11/20/20 0251  . sodium chloride Stopped (11/20/20 0749)  . sodium chloride Stopped (11/08/20 1119)  . sodium chloride    . fentaNYL infusion INTRAVENOUS 50 mcg/hr (11/20/20 0821)  . phenylephrine (NEO-SYNEPHRINE) Adult infusion 150 mcg/min (11/20/20 0800)  . piperacillin-tazobactam 100 mL/hr at 11/20/20 0800  . prismasol BGK 4/2.5 1,500 mL/hr at 11/20/20 0618  . promethazine (PHENERGAN) injection (IM or IVPB) Stopped (11/20/20 0417)  . TPN ADULT (ION) 150 mL/hr at 11/20/20 0800    Assessment/Plan:  50 y.o.malewith longstanding NICM, chronic systolic CHF, VT/VF with prior cardiac arrest 2011 (nonobstructive CAD by cath at that time - 20-30% ramus otherwise small  vessels) s/p STJ ICD, HTN, HLD, neuroendocrine tumor. Underwent Whipple procedure 3/14 withintra-operative SMV laceration and repairby vascular with bovine pericardial patch. Complex hospital course since that time with worsening hemorrhagic shock and anemia, massive transfusion protocol, small bowel necrosis s/p resection, septic shock, tracheostomy, AKI, DIC. On 10/31/19 developed hypotension, AF RVR and 2 inappropriate ICD shocks for rapid atrial fib. Extremely complex hospital stay with multitude of other medical issues.  1. PAF/NSVT Now off amiodarone. AICD is active Family indicates no escalation of Rx. Rhythm is stable Continues on pressors, antibiotics, trach and TPN Overall prognosis poor   Jenkins Rouge 11/20/2020, 8:53 AM

## 2020-11-21 LAB — GLUCOSE, CAPILLARY
Glucose-Capillary: 102 mg/dL — ABNORMAL HIGH (ref 70–99)
Glucose-Capillary: 110 mg/dL — ABNORMAL HIGH (ref 70–99)
Glucose-Capillary: 116 mg/dL — ABNORMAL HIGH (ref 70–99)
Glucose-Capillary: 117 mg/dL — ABNORMAL HIGH (ref 70–99)
Glucose-Capillary: 121 mg/dL — ABNORMAL HIGH (ref 70–99)
Glucose-Capillary: 99 mg/dL (ref 70–99)
Glucose-Capillary: 99 mg/dL (ref 70–99)

## 2020-11-21 LAB — RENAL FUNCTION PANEL
Albumin: 1.1 g/dL — ABNORMAL LOW (ref 3.5–5.0)
Albumin: 1.1 g/dL — ABNORMAL LOW (ref 3.5–5.0)
Anion gap: 7 (ref 5–15)
Anion gap: 9 (ref 5–15)
BUN: 37 mg/dL — ABNORMAL HIGH (ref 6–20)
BUN: 35 mg/dL — ABNORMAL HIGH (ref 6–20)
CO2: 24 mmol/L (ref 22–32)
CO2: 23 mmol/L (ref 22–32)
Calcium: 8.4 mg/dL — ABNORMAL LOW (ref 8.9–10.3)
Calcium: 8.4 mg/dL — ABNORMAL LOW (ref 8.9–10.3)
Chloride: 104 mmol/L (ref 98–111)
Chloride: 102 mmol/L (ref 98–111)
Creatinine, Ser: 1.56 mg/dL — ABNORMAL HIGH (ref 0.61–1.24)
Creatinine, Ser: 1.44 mg/dL — ABNORMAL HIGH (ref 0.61–1.24)
GFR, Estimated: 54 mL/min — ABNORMAL LOW (ref >60)
GFR, Estimated: 60 mL/min — ABNORMAL LOW (ref >60)
Glucose, Bld: 110 mg/dL — ABNORMAL HIGH (ref 70–99)
Glucose, Bld: 96 mg/dL (ref 70–99)
Phosphorus: 4.4 mg/dL (ref 2.5–4.6)
Phosphorus: 4.2 mg/dL (ref 2.5–4.6)
Potassium: 4.2 mmol/L (ref 3.5–5.1)
Potassium: 4.4 mmol/L (ref 3.5–5.1)
Sodium: 135 mmol/L (ref 135–145)
Sodium: 134 mmol/L — ABNORMAL LOW (ref 135–145)

## 2020-11-21 LAB — CBC
HCT: 24.6 % — ABNORMAL LOW (ref 39.0–52.0)
Hemoglobin: 7.2 g/dL — ABNORMAL LOW (ref 13.0–17.0)
MCH: 26.1 pg (ref 26.0–34.0)
MCHC: 29.3 g/dL — ABNORMAL LOW (ref 30.0–36.0)
MCV: 89.1 fL (ref 80.0–100.0)
Platelets: 95 10*3/uL — ABNORMAL LOW (ref 150–400)
RBC: 2.76 MIL/uL — ABNORMAL LOW (ref 4.22–5.81)
RDW: 25.9 % — ABNORMAL HIGH (ref 11.5–15.5)
WBC: 11.5 10*3/uL — ABNORMAL HIGH (ref 4.0–10.5)
nRBC: 2 % — ABNORMAL HIGH (ref 0.0–0.2)

## 2020-11-21 LAB — MAGNESIUM: Magnesium: 2.3 mg/dL (ref 1.7–2.4)

## 2020-11-21 MED ORDER — SODIUM PHOSPHATES 45 MMOLE/15ML IV SOLN
INTRAVENOUS | Status: AC
  Filled 2020-11-21: qty 2052

## 2020-11-21 NOTE — Progress Notes (Signed)
NAMEJORDANI Solomon, MRN:  [id number], DOB:  June 02, 1971, LOS: 62 ADMISSION DATE:  09/11/2020, CONSULTATION DATE:  11/21/20 REFERRING MD:  Anesthesia, CHIEF COMPLAINT:  Whipple, post-op shock   Brief History:  50 y.o. M with PMH of neuroendocrine tumor and underwent Whipple procedure 3/14 with intra-operative SMV laceration and repair by vascular with bovine pericardial patch.  Pt had worsening shock overnight and required three pressors despite massive transfusion protocol (received 150 blood products).  He was taken back to the OR for washout on 3/15 and returned to the ICU intubated.  Taken back 3/17 again found to have small bowel necrosis s/p resection.  Went back for ex-lap on 3/19, noted to have necrosis of nearly entirety of bowel, no bowel resected.  Fascia closed.  Case was discussed with family and [facility] transplant surgery for consideration of eventual small bowel transplant.  Electively returned to the OR on 3/21 for resection of remaining necrotic small bowel removed, G-tube placed, JP drains in place (2). Tracheostomy ws performed by Dr. Bobbye Morton on 4/8.  Started CRRT for AKI on 4/9.  See details below regarding complex hospitalization.  Significant Hospital Events:  3/14: Admit to Surgery, to OR for whipple, SMV injury and repair, massive transfusion protocol overnight  3/15: Back to OR for washout, x2. IR for arteriogram. No major bleed source identified in OR, or arterial bleed w IR. Robust product resuscitation >75 products (+ TXA + novoseven) 3/16: off pressors, add'l 39 products overnight. Slowing resuscitation this morning with hemodynamic improvements and slowing drain output; OR x 2 - washout, biliary t tube, wound vac -- washout, wound vac, IR for arteriogram -- no arterial bleed for embolization 3/17:  Aggressive balanced transfusion continue overnight with marked hemodynamic improvement since yesterday evening. Off pressors x several hours, Wound vac output significantly  slowing (from 573ml q15-85min to 515ml q1hr+) and output is much thinner, Thin bloody secretions from mouth approx 285ml, Decreased RR from 22 to 18, Unable to tolerate NE- severe bradycardia 3/19 taken back to OR. Small bowel noted to be almost completely necrosed. Patient was closed with plans for goals of car discussion.  3/20 [place] family requests full scope of treatment. Primary team discussed with [facility] the possibility of transfer for small bowel transplant. [facility] felt as though transfer would not change outcome. 3/21 Ongoing discussion with family and [facility]. Patient may be a candidate for transplant if he can stabilize post a small bowel resection. He was taken back to the OR and small bowel was resected.  3/23 weaning pressors. Versed off. Remains encephalopathic 3/24 off pressors. Low dose dilaudid gtt -- only weakly grimaces to pain. Changing sedation to precedex + PRN fentanyl. Long discussion with 2 brothers regarding clinical case -- tried to clarify that while the term "stable" has been used, in this instance is meaning that he has not declined from previous shift but is in fact still critically ill with multisystem organ dysfunction.  3/25 Cr and BUN increased. Off sedation  3/26 Pressors off, CT head benign, BUN/Cr continue to creep up 3/27 Tachycardia/HTN overnight improved with fentanyl gtt 3/28 PCCM sign off. Trauma to take over vent/CCM needs.  3/30 extubated 4/2 desaturated overnight to the 30s improved with BVM and NTS.  4/5 poor airway protection. PCCM consulted for re-intubation.  4/6 CT a/p Large RLL opacity, smaller LLL opacity. Post op changes. 5.4cm fluid collection in posterior pelvis, concerning for abscess. Started on Zosyn  4/7 got transfused overnight and then diuresed. Worse renal  function 4/8 Tracheostomy performed by general surgery 4/9 transfused again. HD line inserted and started on CRRT 4/10 started on trach collar trials. 4/12 off CRRT and transitioned to IHD.  Surgery placed abdominal drain with minimal output-- during procedure seemed c/w hematoma however since grew out klebsiella  4/13, 4/14 Tolerated dialysis 4/15 Placed on vent overnight for rest 4/16 Tolerating trach collar 4/17 got HD. Trach downsized to 6 cuffless, trach dislodged overnight, replaced with #6 cuffless shiley.  4/18 afebrile, trach collar + PMV. Up to chair with PT, PCCM signed off 4/21 decannulated; called back for hypotension, Afib/RVR, increased output from his 5 drains and wound vac.Transfused 2 units, re-cannulated, on Levo and Neo.  ICD fired x2, cardiology consulted 4/24 Continues to tolerate trach collar, 5L 28% 4/29 fever overnight, no complaints, remains on tracheostomy collar 4/30-5/1 fever continued, WBC unchanged, blood cultures sent, zosyn restarted 5/3 worsening shock, hypoxemia due to HCAP, back on ventilator, CRRT started again, made DNR, turned off ICD 5/6 ICD was turned back on 5/9 had a family meeting with Dr. Zenia Resides, decision was to continue current care, do not escalate I.e: No addition of pressors 5/10 Patient came off Vasopressin 5/12 started on fentanyl infusion by surgical team   Consults:  PCCM VVS  IR  Palliative Cardiology Nephrology  Procedures:  PICC 3/25 > JP x 2  Biliary drain Pancreatic stent drain ETT 2/24>>3/13; 3/15 >> 3/29;  4/5>>4/8 Trach 4/8> L lateral abdominal drain 4/12>  Significant Diagnostic Tests:  See radiology tab Most recent significant imaging:  4/12 CT abd/pelvis>>  Unchanged ill-defined fluid collections within the left side of the root of the abdominal mesentery, the ventral aspect of the abdominal mesentery, the subcapsular aspect of the right lobe of the liver and the pelvic cul-de-sac all of which are incompletely evaluated on this noncontrast examination though may represent areas of evolving hematomas giving imaging stability for the past week. Redemonstrated ill-defined stranding surrounding the remaining  portions of the pancreas without definable/drainable fluid collection on this noncontrast examination.  Micro Data:  See micro tab Most recent significant findings:   4/5 trach asp >> Klebsiella pneumoniae 4/12 Pelvic abscess >> ampicillin resistant Klebsiella pneumoniae 4/12 BCx> no growth   5/1 blood cultures negative 5/3 resp culture > negative  Antimicrobials:  Zosyn 3/21 > 3/29; 4/5 >4/19; 5/1 >  Eraxis 3/23> 3/29, 4/12>4/19 5/3 > 5/7 Vanco 5/3 > 5/5   Interim History / Subjective:  No overnight issues patient remained afebrile Continue to require vasopressor support with phenylephrine  Objective   Blood pressure (!) 98/46, pulse 70, temperature (!) 97.5 F (36.4 C), temperature source Axillary, resp. rate (!) 24, height 6\' 2"  (1.88 m), weight 98.1 kg, SpO2 98 %. CVP:  [9 mmHg-16 mmHg] 10 mmHg  Vent Mode: PRVC FiO2 (%):  [40 %] 40 % Set Rate:  [24 bmp] 24 bmp Vt Set:  [650 mL] 650 mL PEEP:  [5 cmH20] 5 cmH20 Pressure Support:  [15 cmH20] 15 cmH20 Plateau Pressure:  [23 cmH20-24 cmH20] 24 cmH20   Intake/Output Summary (Last 24 hours) at 11/21/2020 0910 Last data filed at 11/21/2020 0900 Gross per 24 hour  Intake 4876.88 ml  Output 5321 ml  Net -444.12 ml   Filed Weights   11/19/20 0500 11/20/20 0500 11/21/20 0500  Weight: 98.3 kg 98 kg 98.1 kg   Physical Exam: General: Chronically critically ill patient, s/p trach on vent Neuro: Awake, alert, follows commands, diffusely weak Cardiovascular:  RRR, no mrg Lungs: Reduced air entry  at the bases, no wheezes or rhonchi Abdomen: soft, wound vac present, multiple drains Extremities: Bilateral 3+ pitting edema  Personally reviewed labs and imaging Na 135, K 4.2, Cr 1.56, WBC 11.5, Hgb 7.2  Resolved Hospital Problem list   Hemorrhagic shock Klebsiella pneumoniae pneumonia Acute hypoxemic respiratory failure requiring mechanical ventilation  Assessment & Plan:   Acute respiratory failure with hypoxia > worsened  due to HCAP S/p Tracheostomy  Continue mechanical vent support VAP prevention SpO2 goal > 90%  PAF/Atrial Flutter Systolic heart failure  ICD in place Continue tele Back in NSR  Circulatory vs Septic Shock Continue to require vasopressor support with phenylephrine currently at 160 of phenylephrine Per family discussion we will not add a second vasopressors if his MAP drops   HCAP > still no organism identified Multiple abdominal abscesses Possible fungemia He remained afebrile Continue zosyn day 13, will continue for total 2 weeks therapy  S/p near complete small bowel resection: not a candidate for small bowel transplant currently TPN per pharmacy and [facility]  AKI now on HD Severe metabolic acidosis Continue CRRT   Daily Goals Checklist  Per primary Code Status: DNR. Global: remains critically ill. Continue vasopressors. I still think the likelihood he would be able to achieve ambulatory status for small bowel evaluation is quite low. We will continue to follow for critical care needs.     Jacky Kindle MD [facility] See Amion for pager If no response to pager, please call [phone] until 7pm After 7pm, Please call E-link [phone]

## 2020-11-21 NOTE — Progress Notes (Signed)
Dr Tacy Learn updated visitors Abbllah and Smah at bedside. They state they are siblings of [name].

## 2020-11-21 NOTE — Progress Notes (Signed)
PHARMACY - TOTAL PARENTERAL NUTRITION CONSULT NOTE  Indication:  Short bowel syndrome  Patient Measurements: (updated) Height: 6\' 2"  (188 cm) Weight: 97.4 kg (214 lb 11.7 oz) IBW/kg (Calculated) : 82.2 TPN AdjBW (KG): 95.3 Body mass index is 27.57 kg/m.  Assessment:  13 YOM with history of mass adjacent to head of the pancreas in 2021. 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  Developed cholecystitis in Feb 2022 and had percutaneous cholecystectomy tube placement.  Presented this admission for neuroendocrine tumor resection with Whipple procedure, also had right total colectomy, colostomy and cholecystectomy on 10-12-20. SMV was lacerated during procedure and repaired by bovine pericardial patch. Developed hemorrhagic shock with bile leak post-op and underwent placement of biliary T tube with abdominal washout on 3/15 PM. Antimicrobial therapies discontinued 4/20. Pharmacy consulted to manage TPN.   Glucose / Insulin: no hx DM, A1c 5.2%. CBGs 102-113. Stress steroids d/c'd again 5/5. Utilized 2 units mSSI in last 24 hours + 65 units in TPN. Will continue to reduce in TPN given lower CBG trends Electrolytes: Transitioned back to CRRT on 5/3 - K 4.2 on 4K CRRT bags, Phos 4.4, corrected Ca 10.4, Mag 2.3 Renal: CRRT 4/9-4/11 > iHD > back to CRRT on 5/3  Hepatic: LFTs mildly elevated - slight uptrend to 142/142, Tbili noted to jump up to 13 on 5/9, remains elevated at 13.1 on 5/12 - will monitor closely and recheck with 5/14 labs - hesitate to re-attempt TPN cycling while on CRRT, TG 257 (5/9) << 193 (5/6). Albumin 1.1, prealbumin up to 16.5 (5/9) << 7.8 (5/2) TG peaked at 740 (3/26) Intake / Output; MIVF: none to minimal UOP, drain output (5 total): ~1280 mL, net +2.9L GI Imaging: 4/6 CT - pelvic fluid collection concerning for abscess 4/12 CT - no new fluid collections 4/28 CT - new fluid+gas collection in RLQ GI Surgeries / Procedures: 3/14 Whipple procedure with R  hemicolectomy and end ileostomy, cholecystectomy 3/15 washout, VAC placement 3/17 washout, VAC replacement.  Necrosis of entire small bowel except for the PB limb and proximal 40-50cm of jejunum 3/19 re-opening laparotomy, abd closure.  Necrotic SB. 3/21 SB resection, take down of choledochojejunal anastomosis and pancreaticojejunal anastomosis, take down of duodenojejunal anastomosis, placement of externalized biliary drain / Stamm gastrostomy tube, IA washout and placement of intraperitoneal drains 3/30 extubation> 4/8 trach 4/12 left lateral abdominal drain placed by IR  Central access: R IJ CVC placed 3/15; changed to PICC placed 10/03/20 TPN start date: 09/28/2020  Nutritional Goals (RD recommendation on 5/5): 2800-3000 kCal, 185-210g AA, fluid >2L/day  Lipid days (MWF): 3340 kcal with 205g protein, 540g CHO, 68g SMOF lipid NON-lipid days (TThSaSun): 2657kcal with 205g protein, 540g CHO  Current Nutrition:  TPN; NPO  Plan: - Continue TPN at goal rate 150 mL/hr at 1800 - Continue 19g/L SMOF lipids on MWF to prevent EFAD due to elevated TG - TPN will provide weekly average of 2949 kCal and 205g AA per day, meeting 100% of needs - Electrolytes in TPN: Na 120mEq/L, K 15 mEq/L, cont Ca at 2 mEq/L, cont Mg to 2 mEq/L, red Phos to 15 mmol/L (while on CRRT), Cl:Ac 1:1 - Add daily multivitamin to TPN. Remove standard trace elements and add back zinc 5mg  and selenium 88mcg. Remove chromium while on RRT. - Continue mSSI and reduce to 35 units insulin regular in TPN and adjust as needed  - F/u TPN labs. Watch TG trend closely, daily RFP w/ CRRT, recheck TBili  with 5/14 labs  Thank you for involving pharmacy in this patient's care.  Renold Genta, PharmD, BCPS Clinical Pharmacist Clinical phone for 11/21/2020 until 3p is [id number] 11/21/2020 7:49 AM  **Pharmacist phone directory can be found on [email or web address] listed under [place]**

## 2020-11-21 NOTE — Progress Notes (Signed)
[facility] NEPHROLOGY PROGRESS NOTE  Assessment/ Plan: Pt is a 50 y.o. yo male with neuroendocrine tumor status post Whipple procedure right colectomy and SMV repair, developed AKI on dialysis.  Extensive dead small bowel requiring resection, TPN dependent- in hospital  60 days  #AKI RRT dependent, prolonged, since 4/9. ATN.  No evidence of recovery and sig decompensation 5/3 req back on CRRT.  All 4K, slight neg UF, no anticoagulation.  Grim prognosis.  Palliative working with with family. Replete P if < 3.  vascath in 33 days-  Family understands it is only a matter of time until this line gets infected- plan is to not subject him to any more invasive procedures   #Acute respiratory failure required mechanical ventilation. Back on Vent, per CCM. Likely HCAP  #Neuroendocrine tumor status post Whipple procedure with extensive small bowel necrosis status post resection.  TPN dependent  #Severe protein calorie malnutrition, hypoalbuminemia; TPN dependent  #Anemia: Hemoglobin stable.  Transfuse PRBC as needed.  Per CCM and CCS  #Hypotension: Remains on pressors-  Plans to not escalate dosing-  He has edema but is third spacing so have backed off on UF to keep even   # Hyperkalemia, resolved  # metabolic and resp acidosis, as above.    Poor prognosis-   family meeting -  Not actively withdrawing but not escalating   Subjective:   More discomfort-  Narcotics increased-  req inc pressor support as well   K 4.2, phosphorus 4.4  Remains on pressors - not escalating  All 4K bath  Ran basically even yesterday, anuric  Objective Vital signs in last 24 hours: Vitals:   11/21/20 0700 11/21/20 0758 11/21/20 0800 11/21/20 0900  BP: (!) 100/58 (!) 119/43 (!) 82/50 (!) 98/46  Pulse: 69 72 73 70  Resp: 18 (!) 24 (!) 9 (!) 24  Temp:   (!) 97.5 F (36.4 C)   TempSrc:   Axillary   SpO2: 100% 100% 99% 98%  Weight:      Height:       Weight change: 0.1 kg  Intake/Output  Summary (Last 24 hours) at 11/21/2020 1020 Last data filed at 11/21/2020 1002 Gross per 24 hour  Intake 4890.8 ml  Output 5046 ml  Net -155.2 ml       Labs: Basic Metabolic Panel: Recent Labs  Lab 11/20/20 0500 11/20/20 1509 11/21/20 0510  NA 136 136 135  K 4.1 4.3 4.2  CL 104 103 104  CO2 _0 GLUCOSE 114* 119* 110*  BUN 37* 38* 37*  CREATININE 1.52* 1.57* 1.56*  CALCIUM 8.2* 8.2* 8.4*  PHOS 4.1 4.5 4.4   Liver Function Tests: Recent Labs  Lab 11/17/20 0530 11/17/20 1608 11/18/20 0457 11/18/20 1626 11/20/20 0500 11/20/20 1509 11/21/20 0510  AST 96*  --  104*  --  142*  --   --   ALT 124*  --  123*  --  142*  --   --   ALKPHOS 131*  --  125  --  115  --   --   BILITOT 13.0*  --  12.5*  --  13.1*  --   --   PROT 6.5  --  6.3*  --  6.8  --   --   ALBUMIN 1.3*   < > 1.3*  1.2*   < > 1.2* 1.2* 1.1*   < > = values in this interval not displayed.   No results for input(s): LIPASE, AMYLASE in the  last 168 hours. No results for input(s): AMMONIA in the last 168 hours. CBC: Recent Labs  Lab 11/17/20 0530 11/18/20 0457 11/19/20 0509 11/20/20 0500 11/21/20 0510  WBC 16.3* 14.1* 13.0* 12.3* 11.5*  NEUTROABS 8.7*  --   --   --   --   HGB 7.6* 7.5* 7.4* 7.3* 7.2*  HCT 25.0* 24.0* 24.8* 24.5* 24.6*  MCV 84.7 85.4 87.0 88.8 89.1  PLT 128* 129* 122* 108* 95*   Cardiac Enzymes: No results for input(s): CKTOTAL, CKMB, CKMBINDEX, TROPONINI in the last 168 hours. CBG: Recent Labs  Lab 11/20/20 1615 11/20/20 1940 11/20/20 2359 11/21/20 0336 11/21/20 0820  GLUCAP 108* 111* 110* 102* 99    Iron Studies: No results for input(s): IRON, TIBC, TRANSFERRIN, FERRITIN in the last 72 hours. Studies/Results: No results found.  Medications: Infusions: .  prismasol BGK 4/2.5 1,000 mL/hr at 11/21/20 0539  .  prismasol BGK 4/2.5 500 mL/hr at 11/21/20 0049  . sodium chloride 10 mL/hr at 11/21/20 1002  . sodium chloride Stopped (11/08/20 1119)  . sodium chloride     . fentaNYL infusion INTRAVENOUS 150 mcg/hr (11/21/20 1002)  . phenylephrine (NEO-SYNEPHRINE) Adult infusion 160 mcg/min (11/21/20 1002)  . piperacillin-tazobactam Stopped (11/21/20 0900)  . prismasol BGK 4/2.5 1,500 mL/hr at 11/21/20 0611  . promethazine (PHENERGAN) injection (IM or IVPB) Stopped (11/20/20 2352)  . TPN ADULT (ION) 150 mL/hr at 11/21/20 1002  . TPN ADULT (ION)      Scheduled Medications: . chlorhexidine gluconate (MEDLINE KIT)  15 mL Mouth Rinse BID  . Chlorhexidine Gluconate Cloth  6 each Topical Q0600  . heparin injection (subcutaneous)  5,000 Units Subcutaneous Q8H  . insulin aspart  0-15 Units Subcutaneous Q4H  . mouth rinse  15 mL Mouth Rinse 10 times per day  . pantoprazole (PROTONIX) IV  40 mg Intravenous BID  . sodium chloride flush  10-40 mL Intracatheter Q12H  . sodium chloride flush  5 mL Intracatheter Q8H    have reviewed scheduled and prn medications.  Physical Exam: General: Lying in bed,  somnolent  Heart:RRR, s1s2 nl Lungs: Clear anteriorly Abdomen:soft, non-distended Extremities:  LE dep edema- third spacing  Dialysis Access: IJ HD temp catheter in place for 33 days   Nury Nebergall A Indi Willhite 11/21/2020,10:20 AM  LOS: 60 days

## 2020-11-21 NOTE — Progress Notes (Signed)
Biliary drain bag changed in sterile fashion per Dr Zenia Resides order to [facility].

## 2020-11-21 NOTE — Progress Notes (Signed)
35 Days Post-Op  Subjective: Stable neo requirement. Afebrile. Minimal output from biliary drain over last 24 hours.  Objective: Vital signs in last 24 hours: Temp:  [97.2 F (36.2 C)-98.6 F (37 C)] 98.1 F (36.7 C) (05/13 0000) Pulse Rate:  [58-77] 73 (05/13 0500) Resp:  [16-25] 24 (05/13 0600) BP: (94-112)/(44-60) 102/53 (05/13 0600) SpO2:  [92 %-99 %] 95 % (05/13 0500) Arterial Line BP: (98-133)/(36-51) 130/49 (05/13 0600) FiO2 (%):  [40 %] 40 % (05/13 0350) Weight:  [98.1 kg] 98.1 kg (05/13 0500) Last BM Date:  (pta)  Intake/Output from previous day: 05/12 0701 - 05/13 0700 In: 4636.2 [I.V.:4336.3; IV Piggyback:299.9] Out: 4860 [Drains:1280] Intake/Output this shift: No intake/output data recorded.  PE: General: resting comfortably, NAD HEENT: trach in place, site is clean Resp: trach in place, on vent Vent Mode: PRVC FiO2 (%):  [40 %] 40 % Set Rate:  [24 bmp] 24 bmp Vt Set:  [650 mL] 650 mL PEEP:  [5 cmH20] 5 cmH20 Pressure Support:  [15 cmH20] 15 cmH20 Plateau Pressure:  [23 ZOX09-60 cmH20] 24 cmH20 CV: RRR Abdomen: soft, nondistended, vac in place on midline wound. RUQ JP x1 with scant bile-tinged fluid. Biliary drain with minimal bilious output. LUQ perc drain to gravity drainage with purulent fluid. Pancreatic stent draining clear colorless fluid. G tube in place to gravity drainage, draining clear nonbloody gastric contents. Extremities: warm and well-perfused  Lab Results:  Recent Labs    11/19/20 0509 11/20/20 0500  WBC 13.0* 12.3*  HGB 7.4* 7.3*  HCT 24.8* 24.5*  PLT 122* 108*   BMET Recent Labs    11/20/20 1509 11/21/20 0510  NA 136 135  K 4.3 4.2  CL 103 104  CO2 25 24  GLUCOSE 119* 110*  BUN 38* 37*  CREATININE 1.57* 1.56*  CALCIUM 8.2* 8.4*   PT/INR No results for input(s): LABPROT, INR in the last 72 hours. CMP     Component Value Date/Time   NA 135 11/21/2020 0510   NA 142 07/21/2020 0921   K 4.2 11/21/2020 0510   CL  104 11/21/2020 0510   CO2 24 11/21/2020 0510   GLUCOSE 110 (H) 11/21/2020 0510   BUN 37 (H) 11/21/2020 0510   BUN 16 07/21/2020 0921   CREATININE 1.56 (H) 11/21/2020 0510   CREATININE 1.16 03/01/2016 1607   CALCIUM 8.4 (L) 11/21/2020 0510   PROT 6.8 11/20/2020 0500   PROT 6.7 07/21/2020 0921   ALBUMIN 1.1 (L) 11/21/2020 0510   ALBUMIN 4.1 07/21/2020 0921   AST 142 (H) 11/20/2020 0500   ALT 142 (H) 11/20/2020 0500   ALKPHOS 115 11/20/2020 0500   BILITOT 13.1 (H) 11/20/2020 0500   BILITOT 0.8 07/21/2020 0921   GFRNONAA 54 (L) 11/21/2020 0510   GFRNONAA >89 09/22/2015 1008   GFRAA 82 07/21/2020 0921   GFRAA >89 09/22/2015 1008       Assessment/Plan 50 yo male 6 weeks s/p Whipple, right colectomy and SMV repair for T3N0 neuroendocrine tumor. C/b postop hemorrhagic shock with DIC and subsequent diffuse small bowel necrosis, now s/p small bowel resection with external drainage of bile duct, pancreatic duct and stomach. - Biliary drain flushed at bedside this morning, flushes easily with no resistance, suspect it was clogged. Will change bag as the tubing for the bag also appears to be clogged. - CRRT - TPN - Fentanyl for pain - Zofran and phenergan for nausea. - Ok to continue neo to maintain BP. No additional pressors. - VTE:  SQH - Dispo: ICU. Continue current care with no further escalation. Maintain DNR status. Patient remains in multi-organ failure with extremely poor prognosis.   LOS: 60 days    Michaelle Birks, MD [facility] Surgery General, Hepatobiliary and Pancreatic Surgery 11/21/20 7:10 AM

## 2020-11-22 DIAGNOSIS — Z9911 Dependence on respirator [ventilator] status: Secondary | ICD-10-CM

## 2020-11-22 LAB — CBC
HCT: 24 % — ABNORMAL LOW (ref 39.0–52.0)
Hemoglobin: 7.1 g/dL — ABNORMAL LOW (ref 13.0–17.0)
MCH: 26.9 pg (ref 26.0–34.0)
MCHC: 29.6 g/dL — ABNORMAL LOW (ref 30.0–36.0)
MCV: 90.9 fL (ref 80.0–100.0)
Platelets: 86 10*3/uL — ABNORMAL LOW (ref 150–400)
RBC: 2.64 MIL/uL — ABNORMAL LOW (ref 4.22–5.81)
RDW: 25.8 % — ABNORMAL HIGH (ref 11.5–15.5)
WBC: 12.9 10*3/uL — ABNORMAL HIGH (ref 4.0–10.5)
nRBC: 3.9 % — ABNORMAL HIGH (ref 0.0–0.2)

## 2020-11-22 LAB — RENAL FUNCTION PANEL
Albumin: 1 g/dL — ABNORMAL LOW (ref 3.5–5.0)
Albumin: 1 g/dL — ABNORMAL LOW (ref 3.5–5.0)
Anion gap: 8 (ref 5–15)
Anion gap: 8 (ref 5–15)
BUN: 36 mg/dL — ABNORMAL HIGH (ref 6–20)
BUN: 30 mg/dL — ABNORMAL HIGH (ref 6–20)
CO2: 24 mmol/L (ref 22–32)
CO2: 23 mmol/L (ref 22–32)
Calcium: 8.6 mg/dL — ABNORMAL LOW (ref 8.9–10.3)
Calcium: 8.5 mg/dL — ABNORMAL LOW (ref 8.9–10.3)
Chloride: 105 mmol/L (ref 98–111)
Chloride: 105 mmol/L (ref 98–111)
Creatinine, Ser: 1.55 mg/dL — ABNORMAL HIGH (ref 0.61–1.24)
Creatinine, Ser: 1.51 mg/dL — ABNORMAL HIGH (ref 0.61–1.24)
GFR, Estimated: 55 mL/min — ABNORMAL LOW (ref >60)
GFR, Estimated: 56 mL/min — ABNORMAL LOW (ref >60)
Glucose, Bld: 115 mg/dL — ABNORMAL HIGH (ref 70–99)
Glucose, Bld: 129 mg/dL — ABNORMAL HIGH (ref 70–99)
Phosphorus: 4.7 mg/dL — ABNORMAL HIGH (ref 2.5–4.6)
Phosphorus: 5.6 mg/dL — ABNORMAL HIGH (ref 2.5–4.6)
Potassium: 4.4 mmol/L (ref 3.5–5.1)
Potassium: 4.7 mmol/L (ref 3.5–5.1)
Sodium: 137 mmol/L (ref 135–145)
Sodium: 136 mmol/L (ref 135–145)

## 2020-11-22 LAB — GLUCOSE, CAPILLARY
Glucose-Capillary: 100 mg/dL — ABNORMAL HIGH (ref 70–99)
Glucose-Capillary: 102 mg/dL — ABNORMAL HIGH (ref 70–99)
Glucose-Capillary: 111 mg/dL — ABNORMAL HIGH (ref 70–99)
Glucose-Capillary: 114 mg/dL — ABNORMAL HIGH (ref 70–99)
Glucose-Capillary: 115 mg/dL — ABNORMAL HIGH (ref 70–99)
Glucose-Capillary: 121 mg/dL — ABNORMAL HIGH (ref 70–99)

## 2020-11-22 LAB — HEPATIC FUNCTION PANEL
ALT: 175 U/L — ABNORMAL HIGH (ref 0–44)
AST: 191 U/L — ABNORMAL HIGH (ref 15–41)
Albumin: 1.1 g/dL — ABNORMAL LOW (ref 3.5–5.0)
Alkaline Phosphatase: 94 U/L (ref 38–126)
Bilirubin, Direct: 7.8 mg/dL — ABNORMAL HIGH (ref 0.0–0.2)
Indirect Bilirubin: 4.8 mg/dL — ABNORMAL HIGH (ref 0.3–0.9)
Total Bilirubin: 12.6 mg/dL — ABNORMAL HIGH (ref 0.3–1.2)
Total Protein: 6.9 g/dL (ref 6.5–8.1)

## 2020-11-22 LAB — MAGNESIUM: Magnesium: 2.4 mg/dL (ref 1.7–2.4)

## 2020-11-22 MED ORDER — SELENIOUS ACID 60 MCG/ML IV SOLN
INTRAVENOUS | Status: AC
Start: 2020-11-22 — End: 2020-11-23
  Filled 2020-11-22: qty 2052

## 2020-11-22 NOTE — Progress Notes (Signed)
[facility] NEPHROLOGY PROGRESS NOTE  Assessment/ Plan: Pt is a 50 y.o. yo male with neuroendocrine tumor status post Whipple procedure right colectomy and SMV repair, developed AKI on dialysis.  Extensive dead small bowel requiring resection, TPN dependent- in hospital  60 days  #AKI RRT dependent, prolonged, since 4/9. ATN.  No evidence of recovery and sig decompensation 5/3 req back on CRRT.  All 4K, slight neg UF, no anticoagulation.  Grim prognosis.  Palliative working with with family. Replete P if < 3.  vascath in 34 days-  Family understands it is only a matter of time until this line gets infected- plan is to not subject him to any more invasive procedures   #Acute respiratory failure required mechanical ventilation. Back on Vent, per CCM. Likely HCAP  #Neuroendocrine tumor status post Whipple procedure with extensive small bowel necrosis status post resection.  TPN dependent  #Severe protein calorie malnutrition, hypoalbuminemia; TPN dependent  #Anemia: Hemoglobin stable but low.  Transfuse PRBC as needed.  Per CCM and CCS  #Hypotension: Remains on pressors-  Plans to not escalate dosing-  He has edema but is third spacing so have backed off on UF to keep even   # Hyperkalemia, resolved  # metabolic and resp acidosis, as above.    Poor prognosis-   family meeting -  Not actively withdrawing but not escalating   Subjective:   More discomfort-  Narcotics increased-  req inc pressor support as well -  Twitching this AM  K 4.2, phosphorus 4.7  Remains on pressors - not escalating  All 4K bath  Ran basically even yesterday, anuric  Objective Vital signs in last 24 hours: Vitals:   11/22/20 0845 11/22/20 0900 11/22/20 0915 11/22/20 0930  BP:  (!) 95/38  (!) 84/41  Pulse: 67 68 70 68  Resp: (!) 23 15 20  (!) 22  Temp:      TempSrc:      SpO2: 100% 100% 100% 100%  Weight:      Height:       Weight change: 7.3 kg  Intake/Output Summary (Last 24 hours)  at 11/22/2020 0945 Last data filed at 11/22/2020 0900 Gross per 24 hour  Intake 5028.95 ml  Output 5789 ml  Net -760.05 ml       Labs: Basic Metabolic Panel: Recent Labs  Lab 11/21/20 0510 11/21/20 1830 11/22/20 0426  NA 135 134* 137  K 4.2 4.4 4.4  CL 104 102 105  CO2 24 23 24   GLUCOSE 110* 96 115*  BUN 37* 35* 36*  CREATININE 1.56* 1.44* 1.55*  CALCIUM 8.4* 8.4* 8.6*  PHOS 4.4 4.2 4.7*   Liver Function Tests: Recent Labs  Lab 11/18/20 0457 11/18/20 1626 11/20/20 0500 11/20/20 1509 11/21/20 0510 11/21/20 1830 11/22/20 0426  AST 104*  --  142*  --   --   --  191*  ALT 123*  --  142*  --   --   --  175*  ALKPHOS 125  --  115  --   --   --  94  BILITOT 12.5*  --  13.1*  --   --   --  12.6*  PROT 6.3*  --  6.8  --   --   --  6.9  ALBUMIN 1.3*  1.2*   < > 1.2*   < > 1.1* 1.1* 1.1*  1.0*   < > = values in this interval not displayed.   No results for input(s): LIPASE, AMYLASE in  the last 168 hours. No results for input(s): AMMONIA in the last 168 hours. CBC: Recent Labs  Lab 11/17/20 0530 11/18/20 0457 11/19/20 0509 11/20/20 0500 11/21/20 0510 11/22/20 0426  WBC 16.3* 14.1* 13.0* 12.3* 11.5* 12.9*  NEUTROABS 8.7*  --   --   --   --   --   HGB 7.6* 7.5* 7.4* 7.3* 7.2* 7.1*  HCT 25.0* 24.0* 24.8* 24.5* 24.6* 24.0*  MCV 84.7 85.4 87.0 88.8 89.1 90.9  PLT 128* 129* 122* 108* 95* 86*   Cardiac Enzymes: No results for input(s): CKTOTAL, CKMB, CKMBINDEX, TROPONINI in the last 168 hours. CBG: Recent Labs  Lab 11/21/20 1552 11/21/20 1941 11/21/20 2335 11/22/20 0345 11/22/20 0843  GLUCAP 117* 99 116* 102* 111*    Iron Studies: No results for input(s): IRON, TIBC, TRANSFERRIN, FERRITIN in the last 72 hours. Studies/Results: No results found.  Medications: Infusions: .  prismasol BGK 4/2.5 1,000 mL/hr at 11/22/20 0912  .  prismasol BGK 4/2.5 500 mL/hr at 11/22/20 0912  . sodium chloride 250 mL (11/22/20 0825)  . sodium chloride Stopped (11/08/20  1119)  . sodium chloride    . fentaNYL infusion INTRAVENOUS 150 mcg/hr (11/22/20 0700)  . phenylephrine (NEO-SYNEPHRINE) Adult infusion 250 mcg/min (11/22/20 0845)  . piperacillin-tazobactam 3.375 g (11/22/20 0826)  . prismasol BGK 4/2.5 1,500 mL/hr at 11/22/20 0839  . promethazine (PHENERGAN) injection (IM or IVPB) Stopped (11/21/20 2158)  . TPN ADULT (ION) 150 mL/hr at 11/22/20 0700  . TPN ADULT (ION)      Scheduled Medications: . chlorhexidine gluconate (MEDLINE KIT)  15 mL Mouth Rinse BID  . Chlorhexidine Gluconate Cloth  6 each Topical Q0600  . heparin injection (subcutaneous)  5,000 Units Subcutaneous Q8H  . insulin aspart  0-15 Units Subcutaneous Q4H  . mouth rinse  15 mL Mouth Rinse 10 times per day  . pantoprazole (PROTONIX) IV  40 mg Intravenous BID  . sodium chloride flush  10-40 mL Intracatheter Q12H  . sodium chloride flush  5 mL Intracatheter Q8H    have reviewed scheduled and prn medications.  Physical Exam: General: Lying in bed,  somnolent - some twitching Heart:RRR, s1s2 nl Lungs: Clear anteriorly Abdomen:soft, non-distended Extremities:  LE dep edema- third spacing  Dialysis Access: IJ HD temp catheter in place for 34 days   Cleota Pellerito A Amiel Sharrow 11/22/2020,9:45 AM  LOS: 61 days

## 2020-11-22 NOTE — Progress Notes (Addendum)
NAMEBRECKON Solomon, MRN:  [id number], DOB:  06/13/1971, LOS: 63 ADMISSION DATE:  09/15/2020, CONSULTATION DATE:  11/22/20 REFERRING MD:  Anesthesia, CHIEF COMPLAINT:  Whipple, post-op shock   Brief History:  50 y.o. M with PMH of neuroendocrine tumor and underwent Whipple procedure 3/14 with intra-operative SMV laceration and repair by vascular with bovine pericardial patch.  Pt had worsening shock overnight and required three pressors despite massive transfusion protocol (received 150 blood products).  He was taken back to the OR for washout on 3/15 and returned to the ICU intubated.  Taken back 3/17 again found to have small bowel necrosis s/p resection.  Went back for ex-lap on 3/19, noted to have necrosis of nearly entirety of bowel, no bowel resected.  Fascia closed.  Case was discussed with family and [facility] transplant surgery for consideration of eventual small bowel transplant.  Electively returned to the OR on 3/21 for resection of remaining necrotic small bowel removed, G-tube placed, JP drains in place (2). Tracheostomy ws performed by Dr. Bobbye Morton on 4/8.  Started CRRT for AKI on 4/9.  See details below regarding complex hospitalization.  Significant Hospital Events:  3/14: Admit to Surgery, to OR for whipple, SMV injury and repair, massive transfusion protocol overnight  3/15: Back to OR for washout, x2. IR for arteriogram. No major bleed source identified in OR, or arterial bleed w IR. Robust product resuscitation >75 products (+ TXA + novoseven) 3/16: off pressors, add'l 39 products overnight. Slowing resuscitation this morning with hemodynamic improvements and slowing drain output; OR x 2 - washout, biliary t tube, wound vac -- washout, wound vac, IR for arteriogram -- no arterial bleed for embolization 3/17:  Aggressive balanced transfusion continue overnight with marked hemodynamic improvement since yesterday evening. Off pressors x several hours, Wound vac output significantly  slowing (from 565ml q15-31min to 555ml q1hr+) and output is much thinner, Thin bloody secretions from mouth approx 272ml, Decreased RR from 22 to 18, Unable to tolerate NE- severe bradycardia 3/19 taken back to OR. Small bowel noted to be almost completely necrosed. Patient was closed with plans for goals of car discussion.  3/20 [place] family requests full scope of treatment. Primary team discussed with [facility] the possibility of transfer for small bowel transplant. [facility] felt as though transfer would not change outcome. 3/21 Ongoing discussion with family and [facility]. Patient may be a candidate for transplant if he can stabilize post a small bowel resection. He was taken back to the OR and small bowel was resected.  3/23 weaning pressors. Versed off. Remains encephalopathic 3/24 off pressors. Low dose dilaudid gtt -- only weakly grimaces to pain. Changing sedation to precedex + PRN fentanyl. Long discussion with 2 brothers regarding clinical case -- tried to clarify that while the term "stable" has been used, in this instance is meaning that he has not declined from previous shift but is in fact still critically ill with multisystem organ dysfunction.  3/25 Cr and BUN increased. Off sedation  3/26 Pressors off, CT head benign, BUN/Cr continue to creep up 3/27 Tachycardia/HTN overnight improved with fentanyl gtt 3/28 PCCM sign off. Trauma to take over vent/CCM needs.  3/30 extubated 4/2 desaturated overnight to the 30s improved with BVM and NTS.  4/5 poor airway protection. PCCM consulted for re-intubation.  4/6 CT a/p Large RLL opacity, smaller LLL opacity. Post op changes. 5.4cm fluid collection in posterior pelvis, concerning for abscess. Started on Zosyn  4/7 got transfused overnight and then diuresed. Worse renal  function 4/8 Tracheostomy performed by general surgery 4/9 transfused again. HD line inserted and started on CRRT 4/10 started on trach collar trials. 4/12 off CRRT and transitioned to IHD.  Surgery placed abdominal drain with minimal output-- during procedure seemed c/w hematoma however since grew out klebsiella  4/13, 4/14 Tolerated dialysis 4/15 Placed on vent overnight for rest 4/16 Tolerating trach collar 4/17 got HD. Trach downsized to 6 cuffless, trach dislodged overnight, replaced with #6 cuffless shiley.  4/18 afebrile, trach collar + PMV. Up to chair with PT, PCCM signed off 4/21 decannulated; called back for hypotension, Afib/RVR, increased output from his 5 drains and wound vac.Transfused 2 units, re-cannulated, on Levo and Neo.  ICD fired x2, cardiology consulted 4/24 Continues to tolerate trach collar, 5L 28% 4/29 fever overnight, no complaints, remains on tracheostomy collar 4/30-5/1 fever continued, WBC unchanged, blood cultures sent, zosyn restarted 5/3 worsening shock, hypoxemia due to HCAP, back on ventilator, CRRT started again, made DNR, turned off ICD 5/6 ICD was turned back on 5/9 had a family meeting with Dr. Zenia Resides, decision was to continue current care, do not escalate I.e: No addition of pressors 5/10 Patient came off Vasopressin 5/12 started on fentanyl infusion by surgical team   Consults:  PCCM VVS  IR  Palliative Cardiology Nephrology  Procedures:  PICC 3/25 > JP x 2  Biliary drain Pancreatic stent drain ETT 2/24>>3/13; 3/15 >> 3/29;  4/5>>4/8 Trach 4/8> L lateral abdominal drain 4/12>  Significant Diagnostic Tests:  See radiology tab Most recent significant imaging:  4/12 CT abd/pelvis>>  Unchanged ill-defined fluid collections within the left side of the root of the abdominal mesentery, the ventral aspect of the abdominal mesentery, the subcapsular aspect of the right lobe of the liver and the pelvic cul-de-sac all of which are incompletely evaluated on this noncontrast examination though may represent areas of evolving hematomas giving imaging stability for the past week. Redemonstrated ill-defined stranding surrounding the remaining  portions of the pancreas without definable/drainable fluid collection on this noncontrast examination.  Micro Data:  See micro tab Most recent significant findings:   4/5 trach asp >> Klebsiella pneumoniae 4/12 Pelvic abscess >> ampicillin resistant Klebsiella pneumoniae 4/12 BCx> no growth   5/1 blood cultures negative 5/3 resp culture > negative  Antimicrobials:  Zosyn 3/21 > 3/29; 4/5 >4/19; 5/1 >  Eraxis 3/23> 3/29, 4/12>4/19 5/3 > 5/7 Vanco 5/3 > 5/5   Interim History / Subjective:  Afebrile overnight. Labile BPs. Remains on vent, neosynephrine infusion. He denies complaints.  Objective   Blood pressure (!) 84/41, pulse 68, temperature 98.6 F (37 C), temperature source Oral, resp. rate (!) 22, height 6\' 2"  (1.88 m), weight 105.4 kg, SpO2 100 %. CVP:  [6 mmHg-9 mmHg] 6 mmHg  Vent Mode: PRVC FiO2 (%):  [40 %] 40 % Set Rate:  [24 bmp] 24 bmp Vt Set:  [650 mL] 650 mL PEEP:  [5 cmH20] 5 cmH20 Pressure Support:  [15 cmH20] 15 cmH20 Plateau Pressure:  [18 cmH20-24 cmH20] 18 cmH20   Intake/Output Summary (Last 24 hours) at 11/22/2020 1009 Last data filed at 11/22/2020 0900 Gross per 24 hour  Intake 4822.82 ml  Output 5590 ml  Net -767.18 ml   Filed Weights   11/20/20 0500 11/21/20 0500 11/22/20 0400  Weight: 98 kg 98.1 kg 105.4 kg   Physical Exam: General: critically ill appearing man lying in bed in NAD, on vent and CRRT Neuro: awake, tracking around the room and nodding to answer questions. Globally weak.  Breathing above the vent. Cardiovascular:  S1S2, reg rate, irreg rhythm, occ PVCs on tele. Lungs: mild rhonchi, bloody secretions from ETT Abdomen: soft, NT to palpation, multiple drains Extremities: BLE edema, no cyanosis  Personally reviewed labs and imaging WBC 12.9, Hgb 7.1 Platelets 86 AST/  ALT 191/175, worsening T bili 12.6, improved No recent culture data  Resolved Hospital Problem list   Hemorrhagic shock Klebsiella pneumoniae pneumonia Acute  hypoxemic respiratory failure requiring mechanical ventilation  Assessment & Plan:   Acute respiratory failure with hypoxia > worsened due to HCAP S/p Tracheostomy  Hemoptysis today; unknown source of bleeding, but none externally -Con't vent support; currently on PS/CPAP -VAP prevention protocol -PAD protocol -titrate FiO2 to maintain SpO2 goal > 90%  PAF/Atrial Flutter Acute on chronic HFrEF due to CM   ICD in place -Continue tele -AICD has been previously turned back on, but patient is DNR and we are not escalating care.  Circulatory vs Septic Shock-- Continues to require vasopressor support with phenylephrine; currently maxed with hypotension. -Per family discussion we will not add a second vasopressors if his MAP drops due to futility.  HAP > still no organism identified Multiple abdominal abscesses Possible fungemia -Continue zosyn, will continue for total 2 weeks therapy  S/p near complete small bowel resection: not a candidate for small bowel transplant due to ongoing shock and MOF -TPN per pharmacy and [facility]  AKI now on HD, anuria Severe metabolic acidosis -Continue CRRT per nephrology recommendations.  -strict I/Os -renally dose meds, avoid nephrotoxic meds   Daily Goals Checklist  Per primary Code Status: DNR Global: remains critically ill. Continue vasopressors. I still think the likelihood he would be able to achieve ambulatory status for small bowel transplant evaluation is quite low. We will continue to follow for critical care needs.    This patient is critically ill with multiple organ system failure which requires frequent high complexity decision making, assessment, support, evaluation, and titration of therapies. This was completed through the application of advanced monitoring technologies and extensive interpretation of multiple databases. During this encounter critical care time was devoted to patient care services described in this note for 31  minutes.   Julian Hy, DO 11/22/20 10:54 AM [facility]

## 2020-11-22 NOTE — Progress Notes (Signed)
36 Days Post-Op  Subjective: Stable neo requirement. Afebrile. Minimal output from biliary drain over last 24 hours.  Objective: Vital signs in last 24 hours: Temp:  [97.4 F (36.3 C)-98.6 F (37 C)] 98.6 F (37 C) (05/14 0800) Pulse Rate:  [59-205] 68 (05/14 0930) Resp:  [13-29] 22 (05/14 0930) BP: (83-132)/(37-114) 84/41 (05/14 0930) SpO2:  [94 %-100 %] 100 % (05/14 0930) Arterial Line BP: (87-124)/(34-50) 93/34 (05/14 0930) FiO2 (%):  [40 %] 40 % (05/14 0802) Weight:  [105.4 kg] 105.4 kg (05/14 0400) Last BM Date:  (pta)  Intake/Output from previous day: 05/13 0701 - 05/14 0700 In: 5020.7 [I.V.:4770.6; IV Piggyback:250.1] Out: 5935 [Drains:1570] Intake/Output this shift: Total I/O In: 454.3 [I.V.:404.3; IV Piggyback:50] Out: 444 [Drains:75; Other:369]  PE: General: awake, NAD HEENT: trach in place, site is clean Resp: trach in place, on vent Vent Mode: PRVC FiO2 (%):  [40 %] 40 % Set Rate:  [24 bmp] 24 bmp Vt Set:  [650 mL] 650 mL PEEP:  [5 cmH20] 5 cmH20 Pressure Support:  [15 cmH20] 15 cmH20 Plateau Pressure:  [18 cmH20-24 cmH20] 18 cmH20 CV: RRR Abdomen: soft, nondistended, vac in place on midline wound. RUQ JP x1 with scant bile-tinged fluid. Biliary drain with bilious output. LUQ perc drain to gravity drainage with purulent fluid. Pancreatic stent draining clear colorless fluid. G tube in place to gravity drainage, draining clear nonbloody gastric contents. Extremities: warm and well-perfused  Lab Results:  Recent Labs    11/21/20 0510 11/22/20 0426  WBC 11.5* 12.9*  HGB 7.2* 7.1*  HCT 24.6* 24.0*  PLT 95* 86*   BMET Recent Labs    11/21/20 1830 11/22/20 0426  NA 134* 137  K 4.4 4.4  CL 102 105  CO2 23 24  GLUCOSE 96 115*  BUN 35* 36*  CREATININE 1.44* 1.55*  CALCIUM 8.4* 8.6*   PT/INR No results for input(s): LABPROT, INR in the last 72 hours. CMP     Component Value Date/Time   NA 137 11/22/2020 0426   NA 142 07/21/2020 0921    K 4.4 11/22/2020 0426   CL 105 11/22/2020 0426   CO2 24 11/22/2020 0426   GLUCOSE 115 (H) 11/22/2020 0426   BUN 36 (H) 11/22/2020 0426   BUN 16 07/21/2020 0921   CREATININE 1.55 (H) 11/22/2020 0426   CREATININE 1.16 03/01/2016 1607   CALCIUM 8.6 (L) 11/22/2020 0426   PROT 6.9 11/22/2020 0426   PROT 6.7 07/21/2020 0921   ALBUMIN 1.0 (L) 11/22/2020 0426   ALBUMIN 1.1 (L) 11/22/2020 0426   ALBUMIN 4.1 07/21/2020 0921   AST 191 (H) 11/22/2020 0426   ALT 175 (H) 11/22/2020 0426   ALKPHOS 94 11/22/2020 0426   BILITOT 12.6 (H) 11/22/2020 0426   BILITOT 0.8 07/21/2020 0921   GFRNONAA 55 (L) 11/22/2020 0426   GFRNONAA >89 09/22/2015 1008   GFRAA 82 07/21/2020 0921   GFRAA >89 09/22/2015 1008       Assessment/Plan 50 yo male 6 weeks s/p Whipple, right colectomy and SMV repair for T3N0 neuroendocrine tumor. C/b postop hemorrhagic shock with DIC and subsequent diffuse small bowel necrosis, now s/p small bowel resection with external drainage of bile duct, pancreatic duct and stomach.  - Biliary drain now functioning - CRRT - TPN - Fentanyl for pain - Zofran and phenergan for nausea. - Noted plans to continue neo to maintain BP but without further escalation of vasopressors. - VTE: SQH - Dispo: ICU. Continue current care with no  further escalation. Maintain DNR status. Patient remains in multi-organ failure with extremely poor prognosis.   LOS: 61 days   Nadeen Landau, MD [facility] Use [email or web address] to contact on call provider

## 2020-11-22 NOTE — Progress Notes (Signed)
PHARMACY - TOTAL PARENTERAL NUTRITION CONSULT NOTE  Indication:  Short bowel syndrome  Patient Measurements: (updated) Height: 6\' 2"  (188 cm) Weight: 97.4 kg (214 lb 11.7 oz) IBW/kg (Calculated) : 82.2 TPN AdjBW (KG): 95.3 Body mass index is 27.57 kg/m.  Assessment:  80 YOM with history of mass adjacent to head of the pancreas in 2021. 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  Developed cholecystitis in Feb 2022 and had percutaneous cholecystectomy tube placement.  Presented this admission for neuroendocrine tumor resection with Whipple procedure, also had right total colectomy, colostomy and cholecystectomy on 09/21/2020. SMV was lacerated during procedure and repaired by bovine pericardial patch. Developed hemorrhagic shock with bile leak post-op and underwent placement of biliary T tube with abdominal washout on 3/15 PM. Antimicrobial therapies discontinued 4/20. Pharmacy consulted to manage TPN.   Glucose / Insulin: no hx DM, A1c 5.2%. CBGs 99-121. Stress steroids d/c'd again 5/5. Utilized 0 units mSSI in last 24 hours + 35 units in TPN. Will continue to eval need to reduce in TPN given lower CBG trends Electrolytes: Transitioned back to CRRT on 5/3 - K 4.4 on 4K CRRT bags, Phos 4.7, corrected Ca 10.9 (Ca x Phos = 51), Mag 2.3 Renal: CRRT 4/9-4/11 > iHD > back to CRRT on 5/3  Hepatic: LFTs mildly elevated - uptrending 191/175, Tbili noted to jump up to 13 on 5/9, remains elevated at 12.6 on 5/14 - will monitor closely - hesitate to re-attempt TPN cycling while on CRRT, TG 257 (5/9) << 193 (5/6). Albumin 1.1, prealbumin up to 16.5 (5/9) << 7.8 (5/2) TG peaked at 740 (3/26) Intake / Output; MIVF: none to minimal UOP, drain output (5 total): ~1570 mL, net -0.2L GI Imaging: 4/6 CT - pelvic fluid collection concerning for abscess 4/12 CT - no new fluid collections 4/28 CT - new fluid+gas collection in RLQ GI Surgeries / Procedures: 3/14 Whipple procedure with R  hemicolectomy and end ileostomy, cholecystectomy 3/15 washout, VAC placement 3/17 washout, VAC replacement.  Necrosis of entire small bowel except for the PB limb and proximal 40-50cm of jejunum 3/19 re-opening laparotomy, abd closure.  Necrotic SB. 3/21 SB resection, take down of choledochojejunal anastomosis and pancreaticojejunal anastomosis, take down of duodenojejunal anastomosis, placement of externalized biliary drain / Stamm gastrostomy tube, IA washout and placement of intraperitoneal drains 3/30 extubation> 4/8 trach 4/12 left lateral abdominal drain placed by IR  Central access: R IJ CVC placed 3/15; changed to PICC placed 10/03/20 TPN start date: 09/19/2020  Nutritional Goals (RD recommendation on 5/5): 2800-3000 kCal, 185-210g AA, fluid >2L/day  Lipid days (MWF): 3340 kcal with 205g protein, 540g CHO, 68g SMOF lipid NON-lipid days (TThSaSun): 2657kcal with 205g protein, 540g CHO  Current Nutrition:  TPN; NPO  Plan: - Continue TPN at goal rate 150 mL/hr at 1800 - Continue 19g/L SMOF lipids on MWF to prevent EFAD due to elevated TG - TPN will provide weekly average of 2949 kCal and 205g AA per day, meeting 100% of needs - Electrolytes in TPN: Na 174mEq/L, K 15 mEq/L, remove Ca, cont Mg to 2 mEq/L, red Phos to 15 mmol/L (while on CRRT), Cl:Ac 1:1 - Add daily multivitamin to TPN. Remove standard trace elements and add back zinc 5mg  and selenium 70mcg. Remove chromium while on RRT. - Continue mSSI and continue 35 units insulin regular in TPN and adjust as needed  - F/u TPN labs. Watch TG trend closely, daily RFP w/ CRRT  Thank you for involving  pharmacy in this patient's care.  Renold Genta, PharmD, BCPS Clinical Pharmacist Clinical phone for 11/22/2020 until 3p is [phone] 11/22/2020 7:13 AM  **Pharmacist phone directory can be found on [email or web address] listed under [place]**

## 2020-11-23 LAB — RENAL FUNCTION PANEL
Albumin: 1.1 g/dL — ABNORMAL LOW (ref 3.5–5.0)
Albumin: 1.1 g/dL — ABNORMAL LOW (ref 3.5–5.0)
Anion gap: 10 (ref 5–15)
Anion gap: 15 (ref 5–15)
BUN: 30 mg/dL — ABNORMAL HIGH (ref 6–20)
BUN: 27 mg/dL — ABNORMAL HIGH (ref 6–20)
CO2: 22 mmol/L (ref 22–32)
CO2: 16 mmol/L — ABNORMAL LOW (ref 22–32)
Calcium: 8.4 mg/dL — ABNORMAL LOW (ref 8.9–10.3)
Calcium: 8.9 mg/dL (ref 8.9–10.3)
Chloride: 103 mmol/L (ref 98–111)
Chloride: 105 mmol/L (ref 98–111)
Creatinine, Ser: 1.41 mg/dL — ABNORMAL HIGH (ref 0.61–1.24)
Creatinine, Ser: 1.52 mg/dL — ABNORMAL HIGH (ref 0.61–1.24)
GFR, Estimated: >60 mL/min (ref >60)
GFR, Estimated: 56 mL/min — ABNORMAL LOW (ref >60)
Glucose, Bld: 104 mg/dL — ABNORMAL HIGH (ref 70–99)
Glucose, Bld: 103 mg/dL — ABNORMAL HIGH (ref 70–99)
Phosphorus: 4.3 mg/dL (ref 2.5–4.6)
Phosphorus: 5.7 mg/dL — ABNORMAL HIGH (ref 2.5–4.6)
Potassium: 4.3 mmol/L (ref 3.5–5.1)
Potassium: 4.9 mmol/L (ref 3.5–5.1)
Sodium: 135 mmol/L (ref 135–145)
Sodium: 136 mmol/L (ref 135–145)

## 2020-11-23 LAB — CBC
HCT: 22.7 % — ABNORMAL LOW (ref 39.0–52.0)
Hemoglobin: 6.5 g/dL — CL (ref 13.0–17.0)
MCH: 26.4 pg (ref 26.0–34.0)
MCHC: 28.6 g/dL — ABNORMAL LOW (ref 30.0–36.0)
MCV: 92.3 fL (ref 80.0–100.0)
Platelets: 98 10*3/uL — ABNORMAL LOW (ref 150–400)
RBC: 2.46 MIL/uL — ABNORMAL LOW (ref 4.22–5.81)
RDW: 26.5 % — ABNORMAL HIGH (ref 11.5–15.5)
WBC: 17.2 10*3/uL — ABNORMAL HIGH (ref 4.0–10.5)
nRBC: 7.1 % — ABNORMAL HIGH (ref 0.0–0.2)

## 2020-11-23 LAB — TYPE AND SCREEN
ABO/RH(D): O NEG
Antibody Screen: NEGATIVE

## 2020-11-23 LAB — GLUCOSE, CAPILLARY
Glucose-Capillary: 100 mg/dL — ABNORMAL HIGH (ref 70–99)
Glucose-Capillary: 100 mg/dL — ABNORMAL HIGH (ref 70–99)
Glucose-Capillary: 101 mg/dL — ABNORMAL HIGH (ref 70–99)
Glucose-Capillary: 105 mg/dL — ABNORMAL HIGH (ref 70–99)
Glucose-Capillary: 122 mg/dL — ABNORMAL HIGH (ref 70–99)
Glucose-Capillary: 99 mg/dL (ref 70–99)

## 2020-11-23 LAB — MAGNESIUM: Magnesium: 2.4 mg/dL (ref 1.7–2.4)

## 2020-11-23 MED ORDER — ZINC CHLORIDE 1 MG/ML IV SOLN
INTRAVENOUS | Status: DC
  Filled 2020-11-23: qty 2052

## 2020-11-23 NOTE — Progress Notes (Signed)
PHARMACY - TOTAL PARENTERAL NUTRITION CONSULT NOTE  Indication:  Short bowel syndrome  Patient Measurements: (updated) Height: 6\' 2"  (188 cm) Weight: 97.4 kg (214 lb 11.7 oz) IBW/kg (Calculated) : 82.2 TPN AdjBW (KG): 95.3 Body mass index is 27.57 kg/m.  Assessment:  74 YOM with history of mass adjacent to head of the pancreas in 2021. 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  Developed cholecystitis in Feb 2022 and had percutaneous cholecystectomy tube placement.  Presented this admission for neuroendocrine tumor resection with Whipple procedure, also had right total colectomy, colostomy and cholecystectomy on 09/13/2020. SMV was lacerated during procedure and repaired by bovine pericardial patch. Developed hemorrhagic shock with bile leak post-op and underwent placement of biliary T tube with abdominal washout on 3/15 PM. Antimicrobial therapies discontinued 4/20. Pharmacy consulted to manage TPN.   Glucose / Insulin: no hx DM, A1c 5.2%. CBGs 99-111. Stress steroids d/c'd again 5/5. Utilized 0 units mSSI in last 24 hours + 35 units in TPN. Will continue to eval need to reduce in TPN given lower CBG trends Electrolytes: Transitioned back to CRRT on 5/3 - K 4.3 on 4K CRRT bags, Phos 4.3, corrected Ca 10.7 (Ca x Phos <55), Mag 2.4 Renal: CRRT 4/9-4/11 > iHD > back to CRRT on 5/3  Hepatic: LFTs mildly elevated - uptrending 191/175, Tbili noted to jump up to 13 on 5/9, remains elevated at 12.6 on 5/14 - will monitor closely - hesitate to re-attempt TPN cycling while on CRRT, TG 257 (5/9) << 193 (5/6). Albumin 1.1, prealbumin up to 16.5 (5/9) << 7.8 (5/2) TG peaked at 740 (3/26) Intake / Output; MIVF: no UOP, drain output (5 total): ~1230 mL, net -2.5L GI Imaging: 4/6 CT - pelvic fluid collection concerning for abscess 4/12 CT - no new fluid collections 4/28 CT - new fluid+gas collection in RLQ GI Surgeries / Procedures: 3/14 Whipple procedure with R hemicolectomy and  end ileostomy, cholecystectomy 3/15 washout, VAC placement 3/17 washout, VAC replacement.  Necrosis of entire small bowel except for the PB limb and proximal 40-50cm of jejunum 3/19 re-opening laparotomy, abd closure.  Necrotic SB. 3/21 SB resection, take down of choledochojejunal anastomosis and pancreaticojejunal anastomosis, take down of duodenojejunal anastomosis, placement of externalized biliary drain / Stamm gastrostomy tube, IA washout and placement of intraperitoneal drains 3/30 extubation> 4/8 trach 4/12 left lateral abdominal drain placed by IR  Central access: R IJ CVC placed 3/15; changed to PICC placed 10/03/20 TPN start date: 10/09/2020  Nutritional Goals (RD recommendation on 5/5): 2800-3000 kCal, 185-210g AA, fluid >2L/day  Lipid days (MWF): 3340 kcal with 205g protein, 540g CHO, 68g SMOF lipid NON-lipid days (TThSaSun): 2657kcal with 205g protein, 540g CHO  Current Nutrition:  TPN; NPO  Plan: - Continue TPN at goal rate 150 mL/hr at 1800 - Continue 19g/L SMOF lipids on MWF to prevent EFAD due to elevated TG - TPN will provide weekly average of 2949 kCal and 205g AA per day, meeting 100% of needs - Electrolytes in TPN: Na 133mEq/L, K 15 mEq/L, removed Ca 5/14, cont Mg to 2 mEq/L, red Phos to 15 mmol/L (while on CRRT), Cl:Ac 1:1 - Add daily multivitamin to TPN. Remove standard trace elements and add back zinc 5mg  and selenium 25mcg. Remove chromium while on RRT. - Continue mSSI and continue 35 units insulin regular in TPN and adjust as needed  - F/u TPN labs. Watch TG trend closely, daily RFP w/ CRRT  Thank you for involving pharmacy in  this patient's care.  Renold Genta, PharmD, BCPS Clinical Pharmacist Clinical phone for 11/23/2020 until 3p is [phone] 11/23/2020 7:14 AM  **Pharmacist phone directory can be found on [email or web address] listed under [place]**

## 2020-11-23 NOTE — Evaluation (Signed)
[facility] Speaking [facility] - Evaluation Patient Details  Name: [name] MRN: [id number] Date of Birth: 1970/10/10  Today's Date: 11/23/2020 Time: 1350-1420 SLP Time Calculation (min) (ACUTE ONLY): 30 min  Past Medical History:  Past Medical History:  Diagnosis Date  . AICD (automatic cardioverter/defibrillator) present   . Dyslipidemia   . Heart failure with mildly reduced EF    EF 35-40 in 2011 // Echo 06/2019: EF 45-50, Gr 2 DD, RAE, mild MR, mild TR, mild PI, RVSP 33.6  . History of kidney stones   . HTN (hypertension)   . Hx of ventricular fibrillation    Hx of VFib arrest s/p AICD placement  . Nonischemic cardiomyopathy    Past Surgical History:  Past Surgical History:  Procedure Laterality Date  . APPLICATION OF WOUND VAC N/A 09/27/2020   Procedure: APPLICATION OF WOUND VAC;  Surgeon: [name], MD;  Location: [place];  Service: General;  Laterality: N/A;  . APPLICATION OF WOUND VAC N/A 09/29/2020   Procedure: APPLICATION OF WOUND VAC;  Surgeon: [name], MD;  Location: [place];  Service: General;  Laterality: N/A;  . APPLICATION OF WOUND VAC  09/13/2020   Procedure: APPLICATION OF WOUND VAC; PLACEMENT OF NEGATIVE PRESSURE DRESSING;  Surgeon: [name], MD;  Location: [place];  Service: General;;  . BOWEL RESECTION  10/07/2020   Procedure: SMALL BOWEL RESECTION;  Surgeon: [name], MD;  Location: [place];  Service: General;;  . BOWEL RESECTION N/A Sep 30, 2020   Procedure: SMALL BOWEL RESECTION;  Surgeon: [name], MD;  Location: [place];  Service: General;  Laterality: N/A;  . CARDIAC DEFIBRILLATOR PLACEMENT    . CHOLECYSTECTOMY N/A 09/30/2020   Procedure: CHOLECYSTECTOMY;  Surgeon: [name], MD;  Location: [place];  Service: General;  Laterality: N/A;  . COLECTOMY N/A 10/04/2020   Procedure: RIGHT TOTAL COLECTOMY;  Surgeon: [name], MD;  Location: [place];  Service: General;  Laterality: N/A;  . COLOSTOMY  09/15/2020   Procedure:  COLOSTOMY;  Surgeon: [name], MD;  Location: [place];  Service: General;;  . ERCP N/A 05/27/2020   Procedure: ENDOSCOPIC RETROGRADE CHOLANGIOPANCREATOGRAPHY (ERCP);  Surgeon: Clarene Essex, MD;  Location: [place];  Service: Endoscopy;  Laterality: N/A;  . ESOPHAGOGASTRODUODENOSCOPY (EGD) WITH PROPOFOL N/A 06/25/2020   Procedure: ESOPHAGOGASTRODUODENOSCOPY (EGD) WITH PROPOFOL;  Surgeon: Arta Silence, MD;  Location: [facility];  Service: Endoscopy;  Laterality: N/A;  . FINE NEEDLE ASPIRATION N/A 06/25/2020   Procedure: FINE NEEDLE ASPIRATION (FNA) LINEAR;  Surgeon: Arta Silence, MD;  Location: [facility];  Service: Endoscopy;  Laterality: N/A;  . GASTROSTOMY N/A Sep 30, 2020   Procedure: INSERTION OF GASTROSTOMY TUBE;  Surgeon: [name], MD;  Location: [place];  Service: General;  Laterality: N/A;  . IR ANGIOGRAM SELECTIVE EACH ADDITIONAL VESSEL  10/05/2020  . IR ANGIOGRAM SELECTIVE EACH ADDITIONAL VESSEL  10/04/2020  . IR ANGIOGRAM SELECTIVE EACH ADDITIONAL VESSEL  09/19/2020  . IR ANGIOGRAM VISCERAL SELECTIVE  09/17/2020  . IR CATHETER TUBE CHANGE  11/07/2020  . IR PERC CHOLECYSTOSTOMY  08/27/2020  . LAPAROTOMY N/A 09/15/2020   Procedure: REOPENING OF LAPAROTOMY ABDOMINAL WASHOUT;  Surgeon: [name], MD;  Location: [place];  Service: General;  Laterality: N/A;  . LAPAROTOMY N/A 09/14/2020   Procedure: RE-EXPLORATORY LAPAROTOMY;  Surgeon: [name], MD;  Location: [place];  Service: General;  Laterality: N/A;  . LAPAROTOMY N/A 09/26/2020   Procedure: RE-EXPLORATORY LAPAROTOMY; ABDOMINAL WASHOUT;  Surgeon: Zenia Resides,  Blanchard Mane, MD;  Location: [place];  Service: General;  Laterality: N/A;  . LAPAROTOMY N/A 10/04/2020   Procedure: RE -EXPLORATORY LAPAROTOMY AND [place];  Surgeon: [name], MD;  Location: [place];  Service: General;  Laterality: N/A;  . LAPAROTOMY N/A 20-Oct-2020   Procedure: RE-EXPLORATORY LAPAROTOMY;  Surgeon: [name], MD;  Location: [place];  Service:  General;  Laterality: N/A;  . PACEMAKER IMPLANT    . PATCH ANGIOPLASTY N/A 09/30/2020   Procedure: PATCH ANGIOPLASTY SUPERIOR MESENTERIC VEIN;  Surgeon: Elam Dutch, MD;  Location[facility];  Service: Vascular;  Laterality: N/A;  . RADIOLOGY WITH ANESTHESIA N/A 10/09/2020   Procedure: RADIOLOGY WITH ANESTHESIA;  Surgeon: Radiologist, Medication, MD;  Location: [place];  Service: Radiology;  Laterality: N/A;  . REMOVAL OF STONES  05/27/2020   Procedure: REMOVAL OF STONES;  Surgeon: Clarene Essex, MD;  Location[facility];  Service: Endoscopy;;  . repeat echo  2008    demonstrated near normalization  . SPHINCTEROTOMY  05/27/2020   Procedure: SPHINCTEROTOMY;  Surgeon: Clarene Essex, MD;  Location: [facility] ENDOSCOPY;  Service: Endoscopy;;  . TRACHEOSTOMY TUBE PLACEMENT N/A 11/05/2020   Procedure: TRACHEOSTOMY;  Surgeon: [name], MD;  Location: [place];  Service: General;  Laterality: N/A;  . TRANSTHORACIC ECHOCARDIOGRAM  11/09/2005, 01/15/2009, 02/07/2010  . UPPER ESOPHAGEAL ENDOSCOPIC ULTRASOUND (EUS) N/A 06/25/2020   Procedure: UPPER ESOPHAGEAL ENDOSCOPIC ULTRASOUND (EUS);  Surgeon: Arta Silence, MD;  Location: [facility];  Service: Endoscopy;  Laterality: N/A;  . WHIPPLE PROCEDURE N/A 09/30/2020   Procedure: WHIPPLE PROCEDURE;  Surgeon: [name], MD;  Location: [place];  Service: General;  Laterality: N/A;  ROOM 2 STARTING AT 07:30AM FOR 380 MIN   HPI:  41 yr with history of mass adjacent to head of the pancreas in 2021.  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  Developed cholecystitis in Feb 2022 and had percutaneous cholecystectomy tube placement.  Presented this admission for neuroendocrine tumor resection with Whipple procedure, also had right total colectomy, colostomy and cholecystectomy on 09/13/2020. SMV was lacerated during procedure and repaired by bovine pericardial patch. Developed hemorrhagic shock with bile leak post-op and underwent placement of  biliary T tube with abdominal washout on 3/15 PM. All of small bowel has been resected, with external drainage of the bile duct, pancreas and stomach. Pt on TPN. intubated 3/14-3/30.   Assessment / Plan / Recommendation Clinical Impression  Order received from MD for PMSV in line. Per RN, patient is declining. Patient and family are continuing current medical course however with no plans to escalate care in any way at this time. Patient on full vent support, blood pressure very low, 71-47, MD aware and wishes to proceed with PMV as patient would like to speak to family. Upon entrance to room patient alert and awake, agreeable to evaluation. RT present and deflated cuff, turned PEEP down to 0. Patient with good tolerance, no evidence of distress, appropriate drop in Vt e by more than 50% and patient with immediate ability to phonate at the short phrase and sentence level when instructed, all indicating intact upper airway patency. Vocal quality hoarse but audible and per family, intelligible. Considered valve placement however patient comfortable and effectively communicating with family in native language, and given tenuous medical situation, RT and this SLP made decision to hold off on valve placement and allow patient to continue communicating via leak speech with cuff deflated. Following about 15 minutes of  tolerance and conversation, patient began to complain of nausea and with some dry heaving. Cuff reinflated to prevent aspiration of emesis should that happen, and patient allowed to rest. Valve left in room however at this time, given overall medical condition, would recommend that if patient wishes to communicate with family in this short time, RT can deflate cuff and patient can communicate via very effective leak speech for short periods of time. RT in full agreement. Family educated. No further SLP needs indicated at this time however please reconsult if needed. SLP Visit Diagnosis: Aphonia (R49.1)     SLP Assessment  Patient does not need any further Speech Lanaguage Pathology Services    Follow Up Recommendations  None    Frequency and Duration         PMSV Trial PMSV was placed for: n/a Able to redirect subglottic air through upper airway: Yes Able to Attain Phonation: Yes Voice Quality: Hoarse Able to Expectorate Secretions: No attempts Breath Support for Phonation: Mildly decreased Intelligibility: Intelligible Respirations During Trial: 29 SpO2 During Trial: 100 % Behavior: Alert;Controlled   Tracheostomy Tube       Vent Dependency  Vent Dependent: Yes Vent Mode: PRVC Set Rate: 24 bmp PEEP: 5 cmH20 FiO2 (%): 30 % Vt Set: 650 mL    Cuff Deflation Trial  GO Tolerated Cuff Deflation: Yes Length of Time for Cuff Deflation Trial: 15-20 minutes Behavior: Alert;Controlled;Expresses self well Cuff Deflation Trial - Comments: 15-20 minutes   Bennie Chirico MA, CCC-SLP         Marbella Markgraf Meryl 11/23/2020, 2:38 PM

## 2020-11-23 NOTE — Progress Notes (Signed)
Clinical update provided to patient's family at the patient's request after AM discussion of plan to not transfuse blood products. Decision made in light of continuation of plan not to escalate care. Explained to family that blood transfusion is a futile intervention that will not positively impact his clinical course or outcome. Clearly explained that the goal has been to maximize medical therapies to achieve SBT and with deterioration of clinical state, this is no longer a treatment option on the table. Encouraged both the patient and family to prioritize comfort, dignity, the absence of pain, and patient autonomy. Family requests a multi-disciplinary team meeting 5/16 at 1600 to be led by Dr. Zenia Resides. They have also requested me to be present at this meeting. I informed them I would also request representation from the palliative care team and offered them the option of any other staff, specifically CCM, to be present at the meeting. They were agreeable to the covering CCM physician to be present at this meeting. All questions answered.   Additional critical care time: 2min  Billy Oka, MD General and [facility]

## 2020-11-23 NOTE — Progress Notes (Signed)
Trauma/Critical Care Follow Up Note  Subjective:    Overnight Issues:   Objective:  Vital signs for last 24 hours: Temp:  [97.8 F (36.6 C)-98.4 F (36.9 C)] 98.4 F (36.9 C) (05/15 0800) Pulse Rate:  [60-72] 69 (05/15 0736) Resp:  [15-32] 24 (05/15 0736) BP: (74-134)/(30-96) 74/32 (05/15 0736) SpO2:  [100 %] 100 % (05/15 0736) Arterial Line BP: (75-104)/(32-39) 75/33 (05/15 0700) FiO2 (%):  [30 %-40 %] 30 % (05/15 0736) Weight:  [103.1 kg] 103.1 kg (05/15 0400)  Hemodynamic parameters for last 24 hours: CVP:  [7 mmHg-14 mmHg] 9 mmHg  Intake/Output from previous day: 05/14 0701 - 05/15 0700 In: 5257.7 [I.V.:4997; IV Piggyback:260.7] Out: 5876 [Drains:1280]  Intake/Output this shift: Total I/O In: 679.3 [I.V.:629.3; IV Piggyback:50.1] Out: 672 [Drains:75; Other:597]  Vent settings for last 24 hours: Vent Mode: PRVC FiO2 (%):  [30 %-40 %] 30 % Set Rate:  [24 bmp] 24 bmp Vt Set:  [650 mL] 650 mL PEEP:  [5 cmH20] 5 cmH20 Pressure Support:  [15 cmH20] 15 cmH20 Plateau Pressure:  [18 cmH20-19 cmH20] 18 cmH20  Physical Exam:  Gen: comfortable, no distress Neuro: non-focal exam HEENT: PERRL Neck: supple CV: RRR Pulm: unlabored breathing Abd: soft, drains in place GU: CRRT with RIJ Extr: wwp, no edema   Results for orders placed or performed during the hospital encounter of 10/04/2020 (from the past 24 hour(s))  Glucose, capillary     Status: Abnormal   Collection Time: 11/22/20 12:20 PM  Result Value Ref Range   Glucose-Capillary 121 (H) 70 - 99 mg/dL  Glucose, capillary     Status: Abnormal   Collection Time: 11/22/20  3:49 PM  Result Value Ref Range   Glucose-Capillary 115 (H) 70 - 99 mg/dL  Renal function panel (daily at 1600)     Status: Abnormal   Collection Time: 11/22/20  6:41 PM  Result Value Ref Range   Sodium 136 135 - 145 mmol/L   Potassium 4.7 3.5 - 5.1 mmol/L   Chloride 105 98 - 111 mmol/L   CO2 23 22 - 32 mmol/L   Glucose, Bld 129 (H) 70 -  99 mg/dL   BUN 30 (H) 6 - 20 mg/dL   Creatinine, Ser 1.51 (H) 0.61 - 1.24 mg/dL   Calcium 8.5 (L) 8.9 - 10.3 mg/dL   Phosphorus 5.6 (H) 2.5 - 4.6 mg/dL   Albumin 1.0 (L) 3.5 - 5.0 g/dL   GFR, Estimated 56 (L) >60 mL/min   Anion gap 8 5 - 15  Glucose, capillary     Status: Abnormal   Collection Time: 11/22/20  7:44 PM  Result Value Ref Range   Glucose-Capillary 114 (H) 70 - 99 mg/dL  Glucose, capillary     Status: Abnormal   Collection Time: 11/22/20 11:33 PM  Result Value Ref Range   Glucose-Capillary 100 (H) 70 - 99 mg/dL  Glucose, capillary     Status: None   Collection Time: 11/23/20  3:33 AM  Result Value Ref Range   Glucose-Capillary 99 70 - 99 mg/dL  CBC     Status: Abnormal   Collection Time: 11/23/20  5:06 AM  Result Value Ref Range   WBC 17.2 (H) 4.0 - 10.5 K/uL   RBC 2.46 (L) 4.22 - 5.81 MIL/uL   Hemoglobin 6.5 (LL) 13.0 - 17.0 g/dL   HCT 22.7 (L) 39.0 - 52.0 %   MCV 92.3 80.0 - 100.0 fL   MCH 26.4 26.0 - 34.0 pg   MCHC  28.6 (L) 30.0 - 36.0 g/dL   RDW 26.5 (H) 11.5 - 15.5 %   Platelets 98 (L) 150 - 400 K/uL   nRBC 7.1 (H) 0.0 - 0.2 %  Magnesium     Status: None   Collection Time: 11/23/20  5:06 AM  Result Value Ref Range   Magnesium 2.4 1.7 - 2.4 mg/dL  Renal function panel     Status: Abnormal   Collection Time: 11/23/20  5:06 AM  Result Value Ref Range   Sodium 135 135 - 145 mmol/L   Potassium 4.3 3.5 - 5.1 mmol/L   Chloride 103 98 - 111 mmol/L   CO2 22 22 - 32 mmol/L   Glucose, Bld 104 (H) 70 - 99 mg/dL   BUN 30 (H) 6 - 20 mg/dL   Creatinine, Ser 1.41 (H) 0.61 - 1.24 mg/dL   Calcium 8.4 (L) 8.9 - 10.3 mg/dL   Phosphorus 4.3 2.5 - 4.6 mg/dL   Albumin 1.1 (L) 3.5 - 5.0 g/dL   GFR, Estimated >60 >60 mL/min   Anion gap 10 5 - 15  Type and screen [place]     Status: None   Collection Time: 11/23/20  6:30 AM  Result Value Ref Range   ABO/RH(D) O NEG    Antibody Screen NEG    Sample Expiration      11/26/2020,2359 Performed at  [facility] [address], [place], [place] [address]   Glucose, capillary     Status: Abnormal   Collection Time: 11/23/20  8:20 AM  Result Value Ref Range   Glucose-Capillary 100 (H) 70 - 99 mg/dL    Assessment & Plan: The plan of care was discussed with the bedside nurse for the day, who is in agreement with this plan and no additional concerns were raised.   Present on Admission: . Neuroendocrine neoplasm of gastrointestinal tract . Calculus of gallbladder with acute cholecystitis . Pancreas cancer ([place]) . Neuroendocrine tumor    LOS: 62 days   Additional comments:I reviewed the patient's new clinical lab test results.   and I reviewed the patients new imaging test results.    50 yo male >8m s/p Whipple, right colectomy and SMV repair for T3N0 neuroendocrine tumor. C/b postop hemorrhagic shock with DIC and subsequent diffuse small bowel necrosis, now s/p small bowel resection with external drainage of bile duct, pancreatic duct and stomach.  - Biliary drain now functioning - CRRT - TPN - VDRF - continue current vent settings - Fentanyl for pain - Zofran and phenergan for nausea. - Noted plans to continue neo to maintain BP but without further escalation of vasopressors. Noted hgb 6.5 and given clinical picture, will plan not to transfuse. This was discussed directly with the patient.  - VTE: SQH - Dispo: ICU. Continue current care with no further escalation. Maintain DNR status. Patient remains in multi-organ failure with extremely poor prognosis.  Critical Care Total Time: 45 minutes  [name], MD Trauma & General Surgery Please use [email or web address] to contact on call provider  11/23/2020  *Care during the described time interval was provided by me. I have reviewed this patient's available data, including medical history, events of note, physical examination and test results as part of my evaluation.

## 2020-11-23 NOTE — Progress Notes (Signed)
Critical Hbg 6.5 this AM.  Dr. Dema Severin, Trauma on call, notified.  Type & Screen ordered for now, will be discussed with rounding team whether blood products needed or not.  Will relay to oncoming RN.

## 2020-11-23 NOTE — Progress Notes (Signed)
[facility] NEPHROLOGY PROGRESS NOTE  Assessment/ Plan: Pt is a 50 y.o. yo male with neuroendocrine tumor status post Whipple procedure right colectomy and SMV repair, developed AKI on dialysis.  Extensive dead small bowel requiring resection, TPN dependent- in hospital  62 days  #AKI RRT dependent, prolonged, since 4/9. ATN.  No evidence of recovery and sig decompensation 5/3 req back on CRRT.  All 4K, slight neg UF, no anticoagulation.  Grim prognosis.  Palliative working with with family. Replete P if < 3.  vascath in 35 days-  Family understands it is only a matter of time until this line gets infected- plan is to not subject him to any more invasive procedures   #Acute respiratory failure required mechanical ventilation. Back on Vent, per CCM. Likely HCAP  #Neuroendocrine tumor status post Whipple procedure with extensive small bowel necrosis status post resection.  TPN dependent  #Severe protein calorie malnutrition, hypoalbuminemia; TPN dependent- alb 1  #Anemia: Hemoglobin stable but low.  Transfuse PRBC as needed.  Per CCM and CCS.  Personally I would not transfuse today-  Question if lab should even cont to be drawn  #Hypotension: Remains on pressors-  Plans to not escalate dosing-  He has edema but is third spacing so have backed off on UF to keep even   # Hyperkalemia, resolved  # metabolic and resp acidosis, as above.    Poor prognosis-   family meeting -  Not actively withdrawing due to religious reasons but not escalating-  No more invasive procedures   Subjective:   More discomfort-  Narcotics increased- pressor stable but BP low -  Twitching this AM  K 4.3, phosphorus 4.3  Remains on pressors - not escalating  All 4K bath  Ran basically even yesterday, anuric  Objective Vital signs in last 24 hours: Vitals:   11/23/20 0600 11/23/20 0700 11/23/20 0736 11/23/20 0800  BP: (!) 86/46 (!) 83/40 (!) 74/32   Pulse: 70 69 69   Resp: (!) 24 (!) 24 (!)  24   Temp:    98.4 F (36.9 C)  TempSrc:    Axillary  SpO2: 100% 100% 100%   Weight:      Height:       Weight change: -2.3 kg  Intake/Output Summary (Last 24 hours) at 11/23/2020 0945 Last data filed at 11/23/2020 0900 Gross per 24 hour  Intake 5270.09 ml  Output 5835 ml  Net -564.91 ml       Labs: Basic Metabolic Panel: Recent Labs  Lab 11/22/20 0426 11/22/20 1841 11/23/20 0506  NA 137 136 135  K 4.4 4.7 4.3  CL 105 105 103  CO2 24 23 22   GLUCOSE 115* 129* 104*  BUN 36* 30* 30*  CREATININE 1.55* 1.51* 1.41*  CALCIUM 8.6* 8.5* 8.4*  PHOS 4.7* 5.6* 4.3   Liver Function Tests: Recent Labs  Lab 11/18/20 0457 11/18/20 1626 11/20/20 0500 11/20/20 1509 11/22/20 0426 11/22/20 1841 11/23/20 0506  AST 104*  --  142*  --  191*  --   --   ALT 123*  --  142*  --  175*  --   --   ALKPHOS 125  --  115  --  94  --   --   BILITOT 12.5*  --  13.1*  --  12.6*  --   --   PROT 6.3*  --  6.8  --  6.9  --   --   ALBUMIN 1.3*  1.2*   < >  1.2*   < > 1.1*  1.0* 1.0* 1.1*   < > = values in this interval not displayed.   No results for input(s): LIPASE, AMYLASE in the last 168 hours. No results for input(s): AMMONIA in the last 168 hours. CBC: Recent Labs  Lab 11/17/20 0530 11/18/20 0457 11/19/20 0509 11/20/20 0500 11/21/20 0510 11/22/20 0426 11/23/20 0506  WBC 16.3*   < > 13.0* 12.3* 11.5* 12.9* 17.2*  NEUTROABS 8.7*  --   --   --   --   --   --   HGB 7.6*   < > 7.4* 7.3* 7.2* 7.1* 6.5*  HCT 25.0*   < > 24.8* 24.5* 24.6* 24.0* 22.7*  MCV 84.7   < > 87.0 88.8 89.1 90.9 92.3  PLT 128*   < > 122* 108* 95* 86* 98*   < > = values in this interval not displayed.   Cardiac Enzymes: No results for input(s): CKTOTAL, CKMB, CKMBINDEX, TROPONINI in the last 168 hours. CBG: Recent Labs  Lab 11/22/20 1549 11/22/20 1944 11/22/20 2333 11/23/20 0333 11/23/20 0820  GLUCAP 115* 114* 100* 99 100*    Iron Studies: No results for input(s): IRON, TIBC, TRANSFERRIN, FERRITIN  in the last 72 hours. Studies/Results: No results found.  Medications: Infusions: .  prismasol BGK 4/2.5 1,000 mL/hr at 11/23/20 3557  .  prismasol BGK 4/2.5 500 mL/hr at 11/23/20 0617  . sodium chloride 250 mL (11/23/20 0748)  . sodium chloride Stopped (11/08/20 1119)  . sodium chloride    . fentaNYL infusion INTRAVENOUS 150 mcg/hr (11/23/20 0700)  . phenylephrine (NEO-SYNEPHRINE) Adult infusion 250 mcg/min (11/23/20 0700)  . prismasol BGK 4/2.5 1,500 mL/hr at 11/23/20 0916  . promethazine (PHENERGAN) injection (IM or IVPB) 6.25 mg (11/23/20 0749)  . TPN ADULT (ION) 150 mL/hr at 11/23/20 0700    Scheduled Medications: . chlorhexidine gluconate (MEDLINE KIT)  15 mL Mouth Rinse BID  . Chlorhexidine Gluconate Cloth  6 each Topical Q0600  . heparin injection (subcutaneous)  5,000 Units Subcutaneous Q8H  . insulin aspart  0-15 Units Subcutaneous Q4H  . mouth rinse  15 mL Mouth Rinse 10 times per day  . pantoprazole (PROTONIX) IV  40 mg Intravenous BID  . sodium chloride flush  10-40 mL Intracatheter Q12H  . sodium chloride flush  5 mL Intracatheter Q8H    have reviewed scheduled and prn medications.  Physical Exam: General: Lying in bed,  somnolent - some twitching Heart:RRR, s1s2 nl Lungs: Clear anteriorly Abdomen:soft, non-distended Extremities:  LE dep edema- third spacing  Dialysis Access: IJ HD temp catheter in place for 35 days   Deosha Werden A Jayron Maqueda 11/23/2020,9:45 AM  LOS: 62 days

## 2020-11-24 LAB — DIFFERENTIAL
Abs Immature Granulocytes: 6.86 10*3/uL — ABNORMAL HIGH (ref 0.00–0.07)
Basophils Absolute: 0.3 10*3/uL — ABNORMAL HIGH (ref 0.0–0.1)
Basophils Relative: 1 %
Eosinophils Absolute: 0 10*3/uL (ref 0.0–0.5)
Eosinophils Relative: 0 %
Immature Granulocytes: 16 %
Lymphocytes Relative: 13 %
Lymphs Abs: 5.3 10*3/uL — ABNORMAL HIGH (ref 0.7–4.0)
Monocytes Absolute: 5.6 10*3/uL — ABNORMAL HIGH (ref 0.1–1.0)
Monocytes Relative: 13 %
Neutro Abs: 24.1 10*3/uL — ABNORMAL HIGH (ref 1.7–7.7)
Neutrophils Relative %: 57 %

## 2020-11-24 LAB — COMPREHENSIVE METABOLIC PANEL
ALT: 1559 U/L — ABNORMAL HIGH (ref 0–44)
AST: 4418 U/L — ABNORMAL HIGH (ref 15–41)
Albumin: <1 g/dL — ABNORMAL LOW (ref 3.5–5.0)
Alkaline Phosphatase: 307 U/L — ABNORMAL HIGH (ref 38–126)
Anion gap: 14 (ref 5–15)
BUN: 21 mg/dL — ABNORMAL HIGH (ref 6–20)
CO2: 14 mmol/L — ABNORMAL LOW (ref 22–32)
Calcium: 8.9 mg/dL (ref 8.9–10.3)
Chloride: 105 mmol/L (ref 98–111)
Creatinine, Ser: 1.33 mg/dL — ABNORMAL HIGH (ref 0.61–1.24)
GFR, Estimated: >60 mL/min (ref >60)
Glucose, Bld: 141 mg/dL — ABNORMAL HIGH (ref 70–99)
Potassium: 5.3 mmol/L — ABNORMAL HIGH (ref 3.5–5.1)
Sodium: 133 mmol/L — ABNORMAL LOW (ref 135–145)
Total Bilirubin: 13.7 mg/dL — ABNORMAL HIGH (ref 0.3–1.2)
Total Protein: 6.6 g/dL (ref 6.5–8.1)

## 2020-11-24 LAB — CBC
HCT: 24.1 % — ABNORMAL LOW (ref 39.0–52.0)
Hemoglobin: 6.6 g/dL — CL (ref 13.0–17.0)
MCH: 27.5 pg (ref 26.0–34.0)
MCHC: 27.4 g/dL — ABNORMAL LOW (ref 30.0–36.0)
MCV: 100.4 fL — ABNORMAL HIGH (ref 80.0–100.0)
Platelets: 81 10*3/uL — ABNORMAL LOW (ref 150–400)
RBC: 2.4 MIL/uL — ABNORMAL LOW (ref 4.22–5.81)
RDW: 27.3 % — ABNORMAL HIGH (ref 11.5–15.5)
WBC: 42.3 10*3/uL — ABNORMAL HIGH (ref 4.0–10.5)
nRBC: 8.9 % — ABNORMAL HIGH (ref 0.0–0.2)

## 2020-11-24 LAB — MAGNESIUM: Magnesium: 2.4 mg/dL (ref 1.7–2.4)

## 2020-11-24 LAB — PREALBUMIN: Prealbumin: 7.5 mg/dL — ABNORMAL LOW (ref 18–38)

## 2020-11-24 LAB — PHOSPHORUS: Phosphorus: 6.8 mg/dL — ABNORMAL HIGH (ref 2.5–4.6)

## 2020-11-24 LAB — TRIGLYCERIDES: Triglycerides: 221 mg/dL — ABNORMAL HIGH (ref <150)

## 2020-11-24 MED ORDER — MIDAZOLAM 50MG/50ML (1MG/ML) PREMIX INFUSION
0.5000 mg/h | INTRAVENOUS | Status: DC
Start: 2020-11-24 — End: 2020-11-24
  Administered 2020-11-24: 0.5 mg/h via INTRAVENOUS
  Filled 2020-11-24: qty 50

## 2020-12-10 NOTE — Death Summary Note (Signed)
DEATH SUMMARY   Patient Details  Name: [name] MRN: [id number] DOB: 1970/09/18  Admission/Discharge Information   Admit Date:  Oct 18, 2020  Date of Death: Date of Death: Dec 20, 2020  Time of Death: Time of Death: 11-09-55  Length of Stay: 11-08-61  Referring Physician: Gildardo Pounds, NP   Reason(s) for Hospitalization  Surgical resection of neuroendocrine tumor  Diagnoses  Preliminary cause of death: Septic shock ([place]) Secondary Diagnoses (including complications and co-morbidities):  Principal Problem:   Neuroendocrine neoplasm of gastrointestinal tract Active Problems:   Neuroendocrine tumor   Acute hypoxemic respiratory failure ([place])   Small bowel ischemia ([place])   Shock circulatory ([place])   Endotracheally intubated   Physical deconditioning   Aspiration into airway   AKI (acute kidney injury) ([place])   Pressure injury of skin   Brief Hospital Course (including significant findings, care, treatment, and services provided and events leading to death)  [name] is a 50 y.o. year old male who presented with a neuroendocrine tumor involving the pancreatic head and root of the mesentery. After preoperative workup and an extensive discussion of the risks and benefits of surgery, surgical resection was recommended and the patient agreed to proceed with surgery. He was taken to the operating room on 3/14//2022 for a Whipple procedure, cholecystectomy, right hemicolectomy with end ileostomy, and SMV repair with a patch angioplasty. Please see separately dictated operative note for further details of the procedure. Postoperatively he was extubated and transferred to PACU, and then admitted to the surgical ICU. Overnight he became hypotensive with an acute drop in hemoglobin. Hemodynamic instability persisted after transfusion of blood products and he was taken back to the OR on the morning of 3/15 for re-exploration. He was found to have diffuse oozing from multiple raw surfaces  consistent with disseminated intravascular coagulation. The abdomen was packed and a temporary negative pressure dressing was placed. He was returned to the ICU intubated and in critical condition for ongoing resuscitation with blood products. He returned to the OR later that afternoon and had ongoing diffuse coagulopathic bleeding. The abdomen was repacked and a negative pressure dressing was placed. IR angiogram was performed to rule out SMA bleeding that could not be evaluated intraoperatively and did not show any active arterial extravasation. Over the next 24 hours he underwent continuous transfusion of blood products via the massive transfusion protocol, and the bleeding from his wound vac gradually slowed down and stopped. Hemodynamics improved and he was weaned off pressors. On 3/17 he was taken back to the OR for removal of the abdominal packing and attempted closure, and was found to have diffuse necrosis of the majority of the small bowel distal to the pancreatobiliary limb. The necrotic bowel was resected except for the PB limb and the abdomen was left open. He was returned to the OR 2 days later on 3/19 and had progressive diffuse patchy necrosis of the PB limb. The abdomen was closed and he was returned to the ICU. Multiple discussions were had with the patient's family regarding his overall very poor prognosis. The family was very clear in their desire not to pursue comfort care measures and continue with aggressive medical management. Over the next few days the case was also discussed with the surgical team at [facility] regarding the feasibility of an eventual small bowel transplant. The decision was made to remove the remaining necrotic PB limb and try to obtain adequate intraabdominal drainage to allow the patient to recover enough to undergo evaluation for a  small bowel transplant. The patient was taken to the OR on 3/21 and the PB limb was resected, with external drains placed in the bile duct,  pancreatic duct and stomach, as well as two intraperitoneal JP drains. The abdomen was closed. Over the next few days the patient clinically showed slow improvement. He was weaned off pressors. TPN was initiated. Renal function improved and he had good UOP. He was able to wean from the vent and was extubated.   After several days off the vent the patient required reintubation due to weakness and inability to clear secretions. He underwent tracheostomy placement on 10/21/2020. Afterward he was slowly able to wean off the vent to trach collar. Over the next few weeks he had a prolonged and complex ICU course. He had an aspiration event leading to pneumonia and septic shock. His renal failure progressed and he ultimately required hemodialysis.  His hemodynamics slowly improved with antibiotic treatment and supportive care. He later developed atrial fibrillation with RVR and hypotension. Pressors were restarted and rhythm control was achieved with an amiodarone infusion. The patient slowly had modest improvement but later developed recurrent pneumonia and septic shock. He was placed back on the ventilator and pressors were resumed. At this point he had been hospitalized for approximately 2 months and had suffered multiple episodes of shock and was now in multiorgan failure. Multiple physicians involved in the patient's care had extensive discussions with the family regarding his very poor overall prognosis. It was felt to be extremely unlikely that he would survive to hospital discharge to undergo workup for an eventual small bowel transplant. The family agreed not to escalate care any further beyond the current pressors, antibiotics, TPN and ventilator management. The patient was made DNR with the agreement of the family. Over the next few days he continued to deteriorate with progressive hypotension, and he ultimately expired on the afternoon of 09-Dec-2020 at 1357 with family present at bedside.    Pertinent Labs and  Studies  Significant Diagnostic Studies CT ABDOMEN PELVIS WO CONTRAST  Result Date: 11/07/2020 CLINICAL DATA:  Midline wound drainage a a EXAM: CT ABDOMEN AND PELVIS WITHOUT CONTRAST TECHNIQUE: Multidetector CT imaging of the abdomen and pelvis was performed following the standard protocol without IV contrast. COMPARISON:  10/21/2020 FINDINGS: Lower chest: Small left pleural effusion. Bibasilar atelectasis, right greater than left. Pacemaker leads seen within the right ventricle toward the apex. Mild global cardiomegaly. No pericardial effusion. Gynecomastia noted. Hepatobiliary: The gallbladder has been resected. A retrograde biliary drainage CT extends from the common duct percutaneously through the right upper quadrant abdominal wall. No intra or extrahepatic biliary ductal dilation. The liver is unremarkable. Decreasing right subhepatic loculated fluid collection, now measuring 1.7 x 4.7 cm (previously measuring 2.6 x 6.0 cm). Pancreas: Resection of the pancreatic head has been performed with a percutaneous retrograde pancreatic drainage catheter seen extending from the main pancreatic duct through the left upper quadrant abdominal wall. I do not clearly identify the pancreatico enteric anastomosis on this noncontrast examination. Spleen: Progressive splenomegaly with the spleen measuring 16.3 cm in greatest dimension. Adrenals/Urinary Tract: The adrenal glands are unremarkable. Asymmetric atrophy of the right kidney is unchanged. There is compensatory hypertrophy of the left kidney. No hydronephrosis. No intrarenal or ureteral calculi. The bladder is unremarkable. Stomach/Bowel: Balloon retention gastrostomy is seen within the distal stomach. The distal margin of the stomach appears stapled. I do not clearly identify the gastrojejunostomy. Surgical drainage catheters are again seen within the subhepatic space. Since the prior  examination, a percutaneous drainage catheter has been placed via the left flank  through the anterior pararenal space into the previously noted retroperitoneal hematoma. Much of the small bowel has been resected. No identifiable residual small-bowel is seen on this noncontrast examination. The distal transverse, descending, and rectosigmoid colon remain. Since the prior examination, there has developed extensive loculated gas and fluid within the a surgical bed of the duodenal resection. This may relate to enteric communication/enteric leak, flushing of the drainage catheters with incomplete drainage, or, less likely, gas-forming organism. There is new hyperdensity within this collection, best appreciated on image # 56, in keeping with a hematoma. This curvilinear collection is difficult to measure due to its configuration, but measures at least 4.1 x 16.0 cm at axial image # 49 within the a resection bed of the second and third portion of the duodenum and measures at least 2.6 x 9.1 cm antral laterally in the expected resection bed of the small bowel. Gas from the inferior aspect of this collection appears to extend into the midline incision and likely accounts for the reported wound drainage. Separate loculated collection is seen within the deep pelvis measuring 4.3 x 4.7 cm at axial image # 81. No gross free intraperitoneal gas. Vascular/Lymphatic: The mesenteric venous structures are not well appreciated on this noncontrast examination. The abdominal vasculature is otherwise unremarkable on this noncontrast examination. Reproductive: Prostate is unremarkable. Other: Subcutaneous infiltration within the left lower quadrant abdominal wall likely relates to subcutaneous injection. There is mild subcutaneous edema involving the flanks most in keeping with mild anasarca. Musculoskeletal: No acute bone abnormality. IMPRESSION: Complex examination. Suspected near complete resection of the small bowel and right hemicolectomy with the distal colon remaining. Resection of the a duodenum, head of the  pancreas, and gallbladder in keeping with Whipple resection. The pancreatico biliary enteric limb is not well appreciated on this noncontrast examination. Percutaneous drainage of the biliary and pancreatic effluent noted. Repeat imaging with contrast administration, if possible, would better delineate the enteric and vascular structures. Interval percutaneous drainage catheter placement within the retroperitoneal hematoma noted on prior examination within the left anterior pararenal space. Interval development of large complex loculated collection containing extensive interstitial gas and high attenuation material compatible with blood product within the retroperitoneum within the resection bed of the duodenum and small bowel. This may relate to enteric communication, catheter malfunction and incomplete drainage in combination with flushing of the drainage catheters, or, less likely, aggressive infection. This gas and fluid appears to communicate with the laparotomy incision inferiorly. 4.7 cm deep pelvic loculated fluid collection stable from prior examination. Right subhepatic loculated fluid collection appears slightly smaller in the interval. Electronically Signed   By: Fidela Salisbury MD   On: 11/07/2020 03:46   DG Chest 1 View  Result Date: 10/30/2020 CLINICAL DATA:  Shortness of breath. EXAM: CHEST  1 VIEW COMPARISON:  10/20/2020 FINDINGS: The right IJ catheter is stable. The right ventricular pacer wire/AICD is stable. The right PICC line is stable. The cardiac silhouette, mediastinal and hilar contours are normal. The lungs are clear. No pleural effusions, pulmonary edema or pneumothorax. IMPRESSION: No acute cardiopulmonary findings. Electronically Signed   By: Marijo Sanes M.D.   On: 10/30/2020 17:44   IR Catheter Tube Change  Result Date: 11/10/2020 CLINICAL DATA:  Poor output from retroperitoneal drain with enlarging collection on CT EXAM: RETROPERITONEAL CATHETER INJECTION AND EXCHANGE UNDER  FLUOROSCOPY TECHNIQUE: The left retroperitoneal drain catheter and surrounding skin were prepped with betadine, draped in  usual sterile fashion. Survey fluoroscopic inspection reveals stable position of the left rib peritoneal retroperitoneal pigtail drain catheter. Injection demonstrates contrast extending across the midline into the poorly marginated large collection. The catheter was cut and exchanged over an Amplatz wire for a new 16 French multi sidehole fall drain, advanced across midline into the central aspect of the residual collection. Contrast injection confirms good positioning and patency. Catheter was flushed. Catheter was secured externally with 0 Prolene suture and placed to gravity drain bag. The patient tolerated the procedure well. IMPRESSION: 1. Technically successful exchange and revision of left Retroperitoneal drain catheter, for a 16 [place] Thal device. Electronically Signed   By: Lucrezia Europe M.D.   On: 11/10/2020 09:26   DG Chest Port 1 View  Result Date: 11/14/2020 CLINICAL DATA:  Acute respiratory failure. EXAM: PORTABLE CHEST 1 VIEW COMPARISON:  11/13/2020 FINDINGS: Cardiomegaly, tracheostomy tube, RIGHT IJ central venous catheter with tip overlying the mid SVC, RIGHT PICC line with tip overlying the SUPERIOR cavoatrial junction and LEFT-sided pacemaker/AICD again noted. Bilateral airspace opacities are stable to minimally improved. No large pleural effusion or pneumothorax identified. No other significant change noted. IMPRESSION: Stable to minimally improved bilateral airspace opacities. No other significant change. Electronically Signed   By: Margarette Canada M.D.   On: 11/14/2020 08:17   DG Chest Port 1 View  Result Date: 11/13/2020 CLINICAL DATA:  Respiratory failure. EXAM: PORTABLE CHEST 1 VIEW COMPARISON:  11/12/2020. FINDINGS: Tracheostomy tube, right IJ line, right PICC line in stable position. Cardiac pacer stable position. Stable cardiomegaly. Diffuse bilateral pulmonary  infiltrates/edema again noted. No interim change. No pleural effusion. No pneumothorax. IMPRESSION: 1.  Lines and tubes in stable position. 2.  Cardiac pacer in stable position.  Cardiomegaly again noted. 3. Diffuse bilateral pulmonary infiltrates/edema again noted. No interim change. Electronically Signed   By: Marcello Moores  Register   On: 11/13/2020 06:00   DG CHEST PORT 1 VIEW  Result Date: 11/12/2020 CLINICAL DATA:  Tracheostomy tube placement EXAM: PORTABLE CHEST 1 VIEW COMPARISON:  Portable exam 1431 hours compared to 0548 hours FINDINGS: Tracheostomy tube projects over tracheal air column. RIGHT jugular line and RIGHT arm PICC line with tips projecting over SVC. LEFT subclavian ICD with lead projecting over RIGHT ventricle. Enlargement of cardiac silhouette. BILATERAL pulmonary infiltrates RIGHT greater than LEFT consistent with multifocal pneumonia unchanged. No pleural effusion or pneumothorax. IMPRESSION: Persistent BILATERAL pulmonary infiltrates consistent with multifocal pneumonia. Electronically Signed   By: Lavonia Dana M.D.   On: 11/12/2020 16:13   DG Chest Port 1 View  Result Date: 11/12/2020 CLINICAL DATA:  Respiratory failure. EXAM: PORTABLE CHEST 1 VIEW COMPARISON:  11/11/2020. FINDINGS: Tracheostomy tube, right IJ line, right PICC line in stable position. Cardiac pacer stable position. Stable cardiomegaly. Diffuse bilateral pulmonary infiltrates/edema again noted without interim change. No prominent pleural effusion. No pneumothorax. IMPRESSION: 1.  Lines and tubes in stable position. 2.  Cardiac pacer stable position.  Stable cardiomegaly. 3. Diffuse bilateral pulmonary infiltrates/edema again noted without interim change. Electronically Signed   By: Marcello Moores  Register   On: 11/12/2020 05:57   DG CHEST PORT 1 VIEW  Result Date: 11/11/2020 CLINICAL DATA:  Tracheostomy EXAM: PORTABLE CHEST 1 VIEW COMPARISON:  11/11/2020 FINDINGS: Single frontal view of the chest demonstrates a tracheostomy tube  overlying tracheal air column tip just below thoracic inlet. Right internal jugular catheter and right-sided PICC line both overlie the superior vena cava. Single lead AICD unchanged. The cardiac silhouette is stable. Persistent multifocal bilateral airspace disease,  right greater than left. No significant change since prior study. No effusion or pneumothorax. No acute bony abnormalities. IMPRESSION: 1. Support devices as above. 2. Multifocal bilateral airspace disease asymmetric on the right, grossly stable since prior study. Electronically Signed   By: Randa Ngo M.D.   On: 11/11/2020 21:52   DG CHEST PORT 1 VIEW  Result Date: 11/11/2020 CLINICAL DATA:  Tracheostomy tube EXAM: PORTABLE CHEST 1 VIEW COMPARISON:  Yesterday FINDINGS: Right IJ line with tip at the upper SVC. Right PICC with tip at the lower SVC. Tracheostomy tube in place. Single chamber defibrillator lead. Dense airspace disease asymmetric to the right which has progressed. No visible effusion or air leak. Cardiomegaly. IMPRESSION: 1. Worsening airspace disease on the right. 2. No specific findings related to interval tracheostomy tube change. Electronically Signed   By: Monte Fantasia M.D.   On: 11/11/2020 05:02   DG CHEST PORT 1 VIEW  Result Date: 11/10/2020 CLINICAL DATA:  Fever, dyspnea, tracheostomy EXAM: PORTABLE CHEST 1 VIEW COMPARISON:  10/30/2020 chest radiograph. FINDINGS: Tracheostomy tube tip terminates over the tracheal air column at the level of the thoracic inlet. Right internal jugular central venous catheter terminates in the lower third of the SVC. Right PICC terminates at the cavoatrial junction. Single lead left subclavian ICD with lead tip overlying the right ventricle. Stable cardiomediastinal silhouette with mild cardiomegaly. No pneumothorax. Slight blunting of the costophrenic angles bilaterally. Widespread patchy hazy opacities in both lungs, new. IMPRESSION: 1. Well-positioned tracheostomy tube. 2. New widespread  patchy hazy opacities in both lungs, suspect multilobar pneumonia, although a component of cardiogenic pulmonary edema is not excluded given the cardiomegaly. 3. Slight blunting of the costophrenic angles, cannot exclude trace pleural effusions. Electronically Signed   By: Ilona Sorrel M.D.   On: 11/10/2020 14:52   ECHOCARDIOGRAM LIMITED  Result Date: 10/31/2020    ECHOCARDIOGRAM LIMITED REPORT   Patient Name:   KIROLOS RAY Date of Exam: 10/31/2020 Medical Rec #:  [id number]       Height:       74.0 in Accession #:    [id number]      Weight:       214.5 lb Date of Birth:  07-05-1971      BSA:          2.239 m Patient Age:    55 years        BP:           115/64 mmHg Patient Gender: M               HR:           69 bpm. Exam Location:  Inpatient Procedure: 2D Echo, Limited Echo, Cardiac Doppler and Color Doppler Indications:    CHF  History:        Patient has prior history of Echocardiogram examinations, most                 recent 09/26/2020. CHF; Risk Factors:Hypertension.  Sonographer:    Cammy Brochure Referring Phys: [phone] [place]  1. Left ventricular ejection fraction, by estimation, is 50%. The left ventricle has mildly decreased function. The left ventricle demonstrates regional wall motion abnormalities with severe basal inferior and inferoseptal hypokinesis. The left ventricular internal cavity size was mildly dilated. Left ventricular diastolic parameters are consistent with Grade II diastolic dysfunction (pseudonormalization).  2. Right ventricular systolic function is normal. The right ventricular size is normal. Peak RV-RA gradient 31 mmHg.  3.  Left atrial size was mildly dilated.  4. The mitral valve is normal in structure. Mild mitral valve regurgitation. No evidence of mitral stenosis.  5. The aortic valve is tricuspid. Aortic valve regurgitation is not visualized. Mild aortic valve sclerosis is present, with no evidence of aortic valve stenosis.  6. The IVC was not  well-visualized. FINDINGS  Left Ventricle: Left ventricular ejection fraction, by estimation, is 50%. The left ventricle has mildly decreased function. The left ventricle demonstrates regional wall motion abnormalities. The left ventricular internal cavity size was mildly dilated. There is no left ventricular hypertrophy. Left ventricular diastolic parameters are consistent with Grade II diastolic dysfunction (pseudonormalization). Right Ventricle: The right ventricular size is normal. No increase in right ventricular wall thickness. Right ventricular systolic function is normal. Left Atrium: Left atrial size was mildly dilated. Right Atrium: Right atrial size was normal in size. Mitral Valve: The mitral valve is normal in structure. There is mild calcification of the mitral valve leaflet(s). Mild mitral annular calcification. Mild mitral valve regurgitation. No evidence of mitral valve stenosis. MV peak gradient, 9.6 mmHg. The mean mitral valve gradient is 4.0 mmHg. Tricuspid Valve: The tricuspid valve is normal in structure. Tricuspid valve regurgitation is trivial. Aortic Valve: The aortic valve is tricuspid. Aortic valve regurgitation is not visualized. Mild aortic valve sclerosis is present, with no evidence of aortic valve stenosis. Aortic valve mean gradient measures 8.0 mmHg. Aortic valve peak gradient measures 13.5 mmHg. Aortic valve area, by VTI measures 1.89 cm. Pulmonic Valve: The pulmonic valve was normal in structure. Pulmonic valve regurgitation is not visualized. Aorta: The aortic root is normal in size and structure. Venous: The inferior vena cava was not well visualized. IAS/Shunts: No atrial level shunt detected by color flow Doppler. Additional Comments: A device lead is visualized in the right ventricle. LEFT VENTRICLE PLAX 2D LVIDd:         6.80 cm LVIDs:         5.35 cm LV PW:         1.10 cm LV IVS:        0.90 cm LVOT diam:     2.00 cm LV SV:         64 LV SV Index:   29 LVOT Area:     3.14  cm  LV Volumes (MOD) LV vol d, MOD A2C: 124.0 ml LV vol d, MOD A4C: 123.0 ml LV vol s, MOD A2C: 70.9 ml LV vol s, MOD A4C: 74.0 ml LV SV MOD A2C:     53.1 ml LV SV MOD A4C:     123.0 ml LV SV MOD BP:      54.4 ml RIGHT VENTRICLE RV Basal diam:  2.70 cm RV S prime:     13.40 cm/s TAPSE (M-mode): 4.0 cm LEFT ATRIUM             Index       RIGHT ATRIUM           Index LA diam:        4.50 cm 2.01 cm/m  RA Area:     15.50 cm LA Vol (A2C):   85.3 ml 38.10 ml/m RA Volume:   32.80 ml  14.65 ml/m LA Vol (A4C):   81.5 ml 36.40 ml/m LA Biplane Vol: 82.9 ml 37.02 ml/m  AORTIC VALVE                    PULMONIC VALVE AV Area (Vmax):    1.83  cm     PV Vmax:       1.38 m/s AV Area (Vmean):   1.75 cm     PV Peak grad:  7.6 mmHg AV Area (VTI):     1.89 cm AV Vmax:           184.00 cm/s AV Vmean:          131.000 cm/s AV VTI:            0.340 m AV Peak Grad:      13.5 mmHg AV Mean Grad:      8.0 mmHg LVOT Vmax:         107.00 cm/s LVOT Vmean:        72.900 cm/s LVOT VTI:          0.205 m LVOT/AV VTI ratio: 0.60  AORTA Ao Root diam: 2.70 cm MITRAL VALVE                TRICUSPID VALVE MV Area (PHT): 3.99 cm     TR Peak grad:   30.9 mmHg MV Area VTI:   1.45 cm     TR Vmax:        278.00 cm/s MV Peak grad:  9.6 mmHg MV Mean grad:  4.0 mmHg     SHUNTS MV Vmax:       1.55 m/s     Systemic VTI:  0.20 m MV Vmean:      85.6 cm/s    Systemic Diam: 2.00 cm MV Decel Time: 190 msec MV E velocity: 140.00 cm/s MV A velocity: 49.90 cm/s MV E/A ratio:  2.81 Loralie Champagne MD Electronically signed by Loralie Champagne MD Signature Date/Time: 10/31/2020/4:32:06 PM    Final     Microbiology No results found for this or any previous visit (from the past 240 hour(s)).  Lab Basic Metabolic Panel: Recent Labs  Lab 11/20/20 0500 11/20/20 1509 11/21/20 0510 11/21/20 1830 11/22/20 0426 11/22/20 1841 11/23/20 0506 11/23/20 1658 12/14/20 0500  NA 136   < > 135   < > 137 136 135 136 133*  K 4.1   < > 4.2   < > 4.4 4.7 4.3 4.9 5.3*  CL  104   < > 104   < > 105 105 103 105 105  CO2 24   < > 24   < > 24 23 22  16* 14*  GLUCOSE 114*   < > 110*   < > 115* 129* 104* 103* 141*  BUN 37*   < > 37*   < > 36* 30* 30* 27* 21*  CREATININE 1.52*   < > 1.56*   < > 1.55* 1.51* 1.41* 1.52* 1.33*  CALCIUM 8.2*   < > 8.4*   < > 8.6* 8.5* 8.4* 8.9 8.9  MG 2.3  --  2.3  --  2.4  --  2.4  --  2.4  PHOS 4.1   < > 4.4   < > 4.7* 5.6* 4.3 5.7* 6.8*   < > = values in this interval not displayed.   Liver Function Tests: Recent Labs  Lab 11/20/20 0500 11/20/20 1509 11/22/20 0426 11/22/20 1841 11/23/20 0506 11/23/20 1658 12/14/2020 0500  AST 142*  --  191*  --   --   --  4,418*  ALT 142*  --  175*  --   --   --  1,559*  ALKPHOS 115  --  94  --   --   --  307*  BILITOT 13.1*  --  12.6*  --   --   --  13.7*  PROT 6.8  --  6.9  --   --   --  6.6  ALBUMIN 1.2*   < > 1.1*  1.0* 1.0* 1.1* 1.1* <1.0*   < > = values in this interval not displayed.   No results for input(s): LIPASE, AMYLASE in the last 168 hours. No results for input(s): AMMONIA in the last 168 hours. CBC: Recent Labs  Lab 11/20/20 0500 11/21/20 0510 11/22/20 0426 11/23/20 0506 2020-12-13 0500  WBC 12.3* 11.5* 12.9* 17.2* 42.3*  NEUTROABS  --   --   --   --  24.1*  HGB 7.3* 7.2* 7.1* 6.5* 6.6*  HCT 24.5* 24.6* 24.0* 22.7* 24.1*  MCV 88.8 89.1 90.9 92.3 100.4*  PLT 108* 95* 86* 98* 81*   Cardiac Enzymes: No results for input(s): CKTOTAL, CKMB, CKMBINDEX, TROPONINI in the last 168 hours. Sepsis Labs: Recent Labs  Lab 11/21/20 0510 11/22/20 0426 11/23/20 0506 Dec 13, 2020 0500  WBC 11.5* 12.9* 17.2* 42.3*    Procedures/Operations  09/30/2020 - Whipple, right hemicolectomy, end ileostomy, bovine patch repair of SMV 09/24/2020 - Reopening of laparotomy, abdominal washout, biliary T tube placement, temporary abdominal closure with negative pressure dressing 09/19/2020 - IR visceral angiogram 09/20/2020 - Reopening of laparotomy, small bowel resection, temporary abdominal closure with  negative pressure dressing 10/06/2020 - Abdominal exploration, washout, and closure 10/02/2020 - Abdominal exploration, small bowel resection; takedown of pancreaticojejunal, hepaticojejunal, and gastrojejunal anastomoses with placement of externalized biliary and pancreatic stents; placement of intraperitoneal drains; Stamm gastrostomy; abdominal closure 10/28/2020 - Tracheostomy 10/21/20 - CT-guided percutaneous drainage of intraabdominal fluid collection  [name] 11/26/2020, 11:09 AM

## 2020-12-10 NOTE — Progress Notes (Signed)
[facility] NEPHROLOGY PROGRESS NOTE  Assessment/ Plan: Pt is a 50 y.o. yo male with neuroendocrine tumor status post Whipple procedure right colectomy and SMV repair, developed AKI on dialysis.  Extensive dead small bowel requiring resection, TPN dependent- in hospital  62 days  #AKI RRT dependent, prolonged, since 4/9. ATN.  No evidence of recovery and sig decompensation 5/3 req back on CRRT and now further decompensation over the past 24 hours with elevation in LFTs and severe hemodynamic instability.  He is unable to tolerate CRRT at this time due to his low pressures and we must stop as this may cause him to die even more quickly.  We can revisit CRRT if he is to improve but this chance is extremely extremely low.  I discussed with the family that he is likely dying  #Acute respiratory failure required mechanical ventilation. Back on Vent, per CCM. Likely HCAP  #Neuroendocrine tumor status post Whipple procedure with extensive small bowel necrosis status post resection.  TPN dependent  #Severe protein calorie malnutrition, hypoalbuminemia; TPN dependent- alb 1  #Anemia: Transfuse as needed.  Remains low  #Hypotension: Severe shock with multisystem organ failure.  Management per primary team   # metabolic and resp acidosis, as above.    Poor prognosis-   family meeting -continue goals of care.  Patient is likely to pass today  Subjective:   Severe worsening over the past 24 hours with significant hemodynamic instability.  MAP around 30 while I was in the room.  Discussed with multiple friends and family members at the bedside that he is in the process of dying with multisystem organ failure and essentially no chance for recovery.  Active alarms on CRRT while I was in the room related to low arterial pressures  Objective Vital signs in last 24 hours: Vitals:   12/14/20 0900 14-Dec-2020 1000 Dec 14, 2020 1100 2020-12-14 1133  BP: (!) 61/39 (!) 67/37 (!) 69/25 (!) 27/23  Pulse:  (!) 49   (!) 40  Resp: (!) 22 18 (!) 24 (!) 24  Temp:      TempSrc:      SpO2: 100%   100%  Weight:      Height:       Weight change: -0.3 kg  Intake/Output Summary (Last 24 hours) at 14-Dec-2020 1204 Last data filed at 2020-12-14 1100 Gross per 24 hour  Intake 5027 ml  Output 3646 ml  Net 1381 ml       Labs: Basic Metabolic Panel: Recent Labs  Lab 11/23/20 0506 11/23/20 1658 12-14-2020 0500  NA 135 136 133*  K 4.3 4.9 5.3*  CL 103 105 105  CO2 22 16* 14*  GLUCOSE 104* 103* 141*  BUN 30* 27* 21*  CREATININE 1.41* 1.52* 1.33*  CALCIUM 8.4* 8.9 8.9  PHOS 4.3 5.7* 6.8*   Liver Function Tests: Recent Labs  Lab 11/20/20 0500 11/20/20 1509 11/22/20 0426 11/22/20 1841 11/23/20 0506 11/23/20 1658 14-Dec-2020 0500  AST 142*  --  191*  --   --   --  4,418*  ALT 142*  --  175*  --   --   --  1,559*  ALKPHOS 115  --  94  --   --   --  307*  BILITOT 13.1*  --  12.6*  --   --   --  13.7*  PROT 6.8  --  6.9  --   --   --  6.6  ALBUMIN 1.2*   < > 1.1*  1.0*   < >  1.1* 1.1* <1.0*   < > = values in this interval not displayed.   No results for input(s): LIPASE, AMYLASE in the last 168 hours. No results for input(s): AMMONIA in the last 168 hours. CBC: Recent Labs  Lab 11/20/20 0500 11/21/20 0510 11/22/20 0426 11/23/20 0506 December 19, 2020 0500  WBC 12.3* 11.5* 12.9* 17.2* 42.3*  NEUTROABS  --   --   --   --  24.1*  HGB 7.3* 7.2* 7.1* 6.5* 6.6*  HCT 24.5* 24.6* 24.0* 22.7* 24.1*  MCV 88.8 89.1 90.9 92.3 100.4*  PLT 108* 95* 86* 98* 81*   Cardiac Enzymes: No results for input(s): CKTOTAL, CKMB, CKMBINDEX, TROPONINI in the last 168 hours. CBG: Recent Labs  Lab 11/23/20 0820 11/23/20 1221 11/23/20 1604 11/23/20 1948 11/23/20 2349  GLUCAP 100* 105* 100* 101* 122*    Iron Studies: No results for input(s): IRON, TIBC, TRANSFERRIN, FERRITIN in the last 72 hours. Studies/Results: No results found.  Medications: Infusions: .  prismasol BGK 4/2.5 1,000 mL/hr at  19-Dec-2020 0814  .  prismasol BGK 4/2.5 500 mL/hr at 2020/12/19 0305  . sodium chloride 10 mL/hr at 2020-12-19 1100  . sodium chloride Stopped (11/08/20 1119)  . sodium chloride    . fentaNYL infusion INTRAVENOUS 175 mcg/hr (2020/12/19 1100)  . phenylephrine (NEO-SYNEPHRINE) Adult infusion 250 mcg/min (19-Dec-2020 1111)  . prismasol BGK 4/2.5 1,500 mL/hr at 19-Dec-2020 0636  . promethazine (PHENERGAN) injection (IM or IVPB) Stopped (12/19/2020 0204)  . TPN ADULT (ION) 150 mL/hr at 12/19/20 1100    Scheduled Medications: . chlorhexidine gluconate (MEDLINE KIT)  15 mL Mouth Rinse BID  . Chlorhexidine Gluconate Cloth  6 each Topical Q0600  . insulin aspart  0-15 Units Subcutaneous Q4H  . mouth rinse  15 mL Mouth Rinse 10 times per day  . pantoprazole (PROTONIX) IV  40 mg Intravenous BID  . sodium chloride flush  10-40 mL Intracatheter Q12H  . sodium chloride flush  5 mL Intracatheter Q8H    have reviewed scheduled and prn medications.  Physical Exam: General: Lying in bed, nonresponsive, twitching Heart: Normal rate Lungs: Clear anteriorly Abdomen:soft, non-distended Extremities:  LE dep edema- third spacing  Dialysis Access: IJ HD temp catheter  Reesa Chew 12/19/20,12:04 PM  LOS: 63 days

## 2020-12-10 NOTE — Progress Notes (Signed)
Patient's heart rhythm became asystole at 1357 on 11/12/2020. Myself and Trish Mage, RN auscultated heart and breath sounds. No heart or breath sounds present. Ventilator and medications were turned off. Family was at bedside at time of death. Restraints were not on the patient at the time of death or within the last 24 hours. Patient was a DNR at time of death. Dr. Michaelle Birks was notified. [place] was the funeral home/mosque of choice by the family. Belongings returned to the family. Patient placement notified.  Thayer Ohm D

## 2020-12-10 NOTE — Progress Notes (Signed)
Patient has significantly deteriorated. MAPs in 40s on neo, bradycardic in 50s. Transaminases >3000, WBC 47. Patient is somnolent and nonresponsive this morning. On full vent support.  Vitals:   11/21/2020 0630 11/19/2020 0700  BP: (!) 65/37 (!) 63/40  Pulse: (!) 53 (!) 52  Resp: (!) 23 19  Temp:    SpO2: 100% 100%   General: resting in bed, critically ill Neuro: lethargic, not responsive to verbal stimuli HEENT: trach in place Resp: on vent Vent Mode: PRVC FiO2 (%):  [30 %] 30 % Set Rate:  [24 bmp] 24 bmp Vt Set:  [650 mL] 650 mL PEEP:  [5 cmH20] 5 cmH20 Plateau Pressure:  [29 cmH20-32 cmH20] 29 cmH20 CV: bradycardic to 51 Abdomen: vac in place, multiple drains with bilious output   Patient has worsening shock, likely sepsis given rising WBC. He remains in multiorgan failure now with evidence of shock liver. Prognosis is extremely poor and I think he is unlikely to survive the next 24 hours. Further interventions at this point would be futile and will not change the patient's outcome. I have called the patient's family and have requested they come to bedside for a meeting this morning.  Michaelle Birks, MD [facility] Surgery General, Hepatobiliary and Pancreatic Surgery 12/01/2020 7:42 AM

## 2020-12-10 NOTE — Progress Notes (Signed)
PHARMACY - TOTAL PARENTERAL NUTRITION CONSULT NOTE  Indication:  Short bowel syndrome  Patient Measurements: (updated) Height: 6\' 2"  (188 cm) Weight: 97.4 kg (214 lb 11.7 oz) IBW/kg (Calculated) : 82.2 TPN AdjBW (KG): 95.3 Body mass index is 27.57 kg/m.  Assessment:  50 YOM with history of mass adjacent to head of the pancreas in 2021. 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  Developed cholecystitis in Feb 2022 and had percutaneous cholecystectomy tube placement.  Presented this admission for neuroendocrine tumor resection with Whipple procedure, also had right total colectomy, colostomy and cholecystectomy on 09/19/2020. SMV was lacerated during procedure and repaired by bovine pericardial patch. Developed hemorrhagic shock with bile leak post-op and underwent placement of biliary T tube with abdominal washout on 3/15 PM. Antimicrobial therapies discontinued 4/20. Pharmacy consulted to manage TPN.  Central access: R IJ CVC placed 3/15; changed to PICC placed 10/03/20 TPN start date: 10/01/20  Nutritional Goals (RD recommendation on 5/5): 2800-3000 kCal, 185-210g AA, fluid >2L/day  Lipid days (MWF): 3340 kcal with 205g protein, 540g CHO, 68g SMOF lipid NON-lipid days (TThSaSun): 2657kcal with 205g protein, 540g CHO  Current Nutrition:  TPN; NPO  Plan: No TPN orders today per discussion with Surgery Zenia Resides), as patient has significantly deteriorated with multiorgan failure, approaching end of life    Arturo Morton, PharmD, BCPS Please check AMION for all [facility] contact numbers Clinical Pharmacist 11/17/2020 10:27 AM'

## 2020-12-10 NOTE — Procedures (Signed)
I saw the patient on CRRT. I have reviewed data over the past 24 hours.  The patient has become progressively more hemodynamically unstable.  Severely low MAP.  Multiorgan failure.  Active issues with arterial pressure due to low MAP.  He is too unstable to continue CRRT at this time and we must stop.  Filed Weights   11/22/20 0400 11/23/20 0400 29-Nov-2020 0500  Weight: 105.4 kg 103.1 kg 102.8 kg    Recent Labs  Lab 2020/11/29 0500  NA 133*  K 5.3*  CL 105  CO2 14*  GLUCOSE 141*  BUN 21*  CREATININE 1.33*  CALCIUM 8.9  PHOS 6.8*    Recent Labs  Lab 11/22/20 0426 11/23/20 0506 Nov 29, 2020 0500  WBC 12.9* 17.2* 42.3*  NEUTROABS  --   --  24.1*  HGB 7.1* 6.5* 6.6*  HCT 24.0* 22.7* 24.1*  MCV 90.9 92.3 100.4*  PLT 86* 98* 81*    Scheduled Meds: . chlorhexidine gluconate (MEDLINE KIT)  15 mL Mouth Rinse BID  . Chlorhexidine Gluconate Cloth  6 each Topical Q0600  . insulin aspart  0-15 Units Subcutaneous Q4H  . mouth rinse  15 mL Mouth Rinse 10 times per day  . pantoprazole (PROTONIX) IV  40 mg Intravenous BID  . sodium chloride flush  10-40 mL Intracatheter Q12H  . sodium chloride flush  5 mL Intracatheter Q8H   Continuous Infusions: .  prismasol BGK 4/2.5 1,000 mL/hr at 2020-11-29 0814  .  prismasol BGK 4/2.5 500 mL/hr at Nov 29, 2020 0305  . sodium chloride 10 mL/hr at Nov 29, 2020 1100  . sodium chloride Stopped (11/08/20 1119)  . sodium chloride    . fentaNYL infusion INTRAVENOUS 175 mcg/hr (Nov 29, 2020 1100)  . phenylephrine (NEO-SYNEPHRINE) Adult infusion 250 mcg/min (2020/11/29 1111)  . prismasol BGK 4/2.5 1,500 mL/hr at 2020/11/29 0636  . promethazine (PHENERGAN) injection (IM or IVPB) Stopped (11-29-2020 0204)  . TPN ADULT (ION) 150 mL/hr at 2020-11-29 1100   PRN Meds:.sodium chloride, sodium chloride, alteplase, diphenhydrAMINE, fentaNYL (SUBLIMAZE) injection, heparin, ipratropium-albuterol, lidocaine (PF), lidocaine-prilocaine, ondansetron (ZOFRAN) IV, pentafluoroprop-tetrafluoroeth,  polyvinyl alcohol, promethazine (PHENERGAN) injection (IM or IVPB), sodium chloride, sodium chloride flush   Santiago Bumpers,  MD 11/29/20, 12:02 PM

## 2020-12-10 NOTE — Progress Notes (Addendum)
Clinical update provided in person to four family members/friends. Explained MSOF and patient's impending demise. Clearly stated that there are no medical interventions at this point that can be offered. Family continues to struggle with acceptance. I encouraged them to consider transitioning to a focus on the patient's comfort given his more rapid deterioration this evening. They remain hopeful and prayerful. All questions answered. Discussion held with the patient's nurse this evening present.   I notified Dr. Zenia Resides earlier this evening of the patient's decline and the plan implemented today. She is in agreement with the current care plan. On that call, I also communicated the family's desire for a meeting 5/16 at 1600.   Critical care time: 2min  [name], MD General and [facility]

## 2020-12-10 NOTE — Progress Notes (Signed)
150cc of Fentanyl wasted in sink. 30 cc of Versed wasted in sink. Wasted with Lauren RN.

## 2020-12-10 NOTE — Progress Notes (Signed)
I met with two of the patient's brothers and multiple family friends in the ICU. I discussed that [name] has significantly deteriorated and is now in respiratory failure, renal failure, and has shock liver. He remains hypotensive with pressor requirement. They asked about transfusing blood and escalating pressors, and I explained that those things may temporarily improve his blood pressure but do not address the underlying issue of his multiorgan failure and will not change his overall outcome. I discussed that we have reached the limit of medical management and there are no further interventions at this point that can help [name] or change the outcame. They asked questions about his initial surgery and hospital course, which I answered. They brought up his ICD and they would still like to keep it turned on. They do not want to transition to full comfort care measures but they did agree to stop CRRT after a discussion with nephrology. I told them that [name] is at the end of his life and encouraged them to have family and friends come to visit with him today.  Michaelle Birks, MD [facility] Surgery General, Hepatobiliary and Pancreatic Surgery 2020/11/28 10:05 AM

## 2020-12-10 DEATH — deceased

## 2022-03-23 IMAGING — CT CT ABD-PELV W/ CM
2 of 5 series · 16 of 46 positions shown, 18 images · IV contrast (APPLIED)
Comparison: 05/24/2020

CLINICAL DATA: Severe abdominal pain

EXAM:
CT ABDOMEN AND PELVIS WITH CONTRAST
TECHNIQUE: Multidetector CT imaging of the abdomen and pelvis was performed
using the standard protocol following bolus administration of
intravenous contrast.
CONTRAST:  100mL OMNIPAQUE IOHEXOL 300 MG/ML  SOLN

[Series 3: abdomen 5.0 · axial · 0.81mm/px · z∈[-551,-101]mm · 13 of 102 slices shown, 15 images]
[im 6/102  soft-tissue]
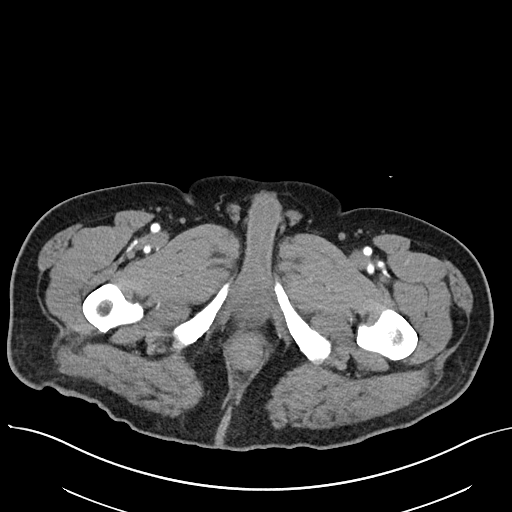
[im 6/102  bone]
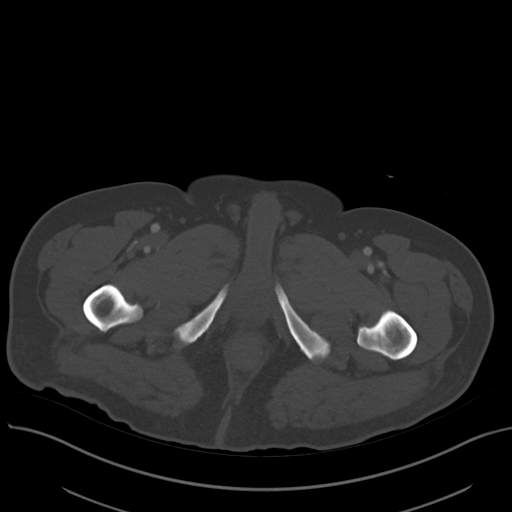
[im 12/102  soft-tissue]
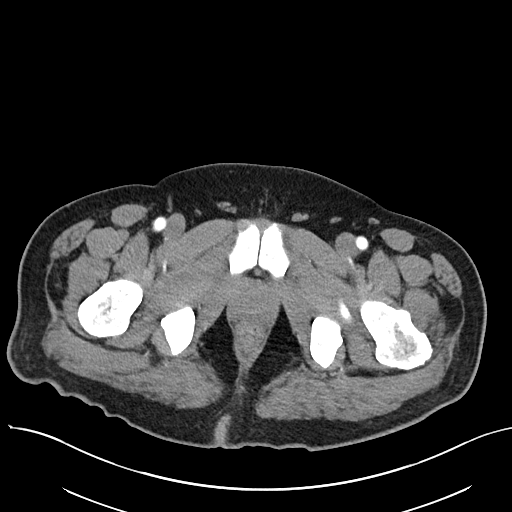
[im 24/102  soft-tissue]
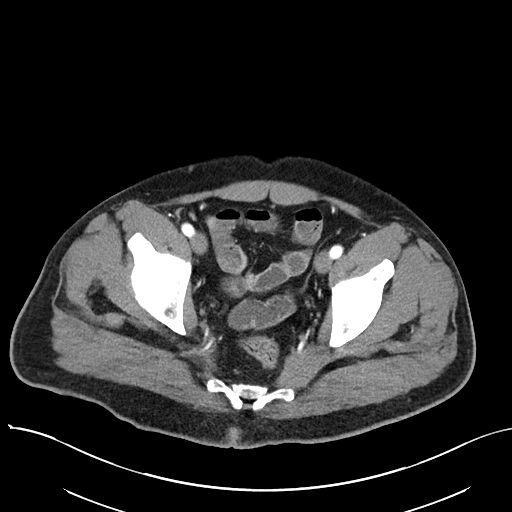
[im 30/102  soft-tissue]
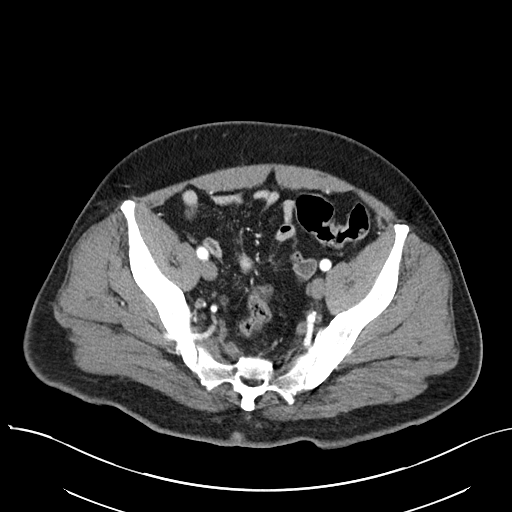
[im 36/102  soft-tissue]
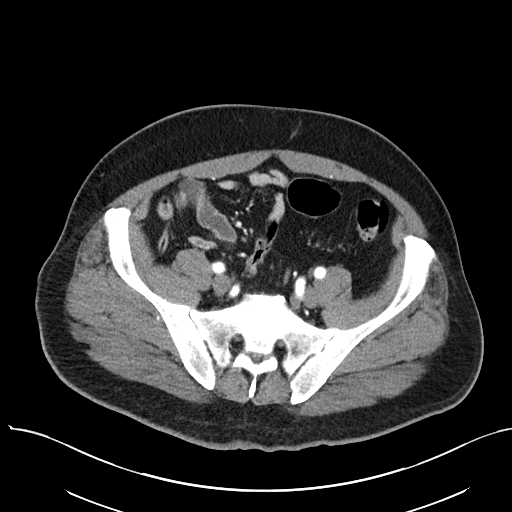
[im 42/102  soft-tissue]
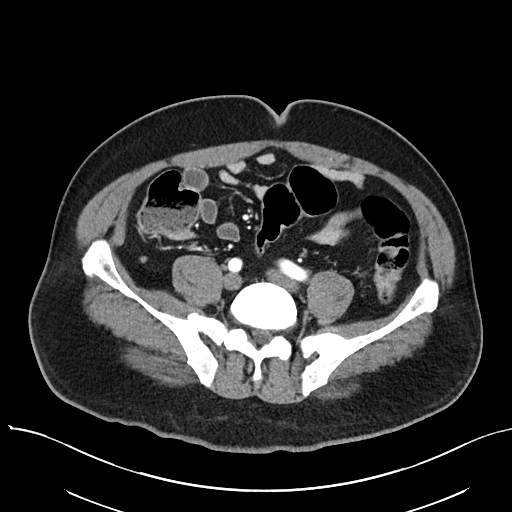
[im 54/102  soft-tissue]
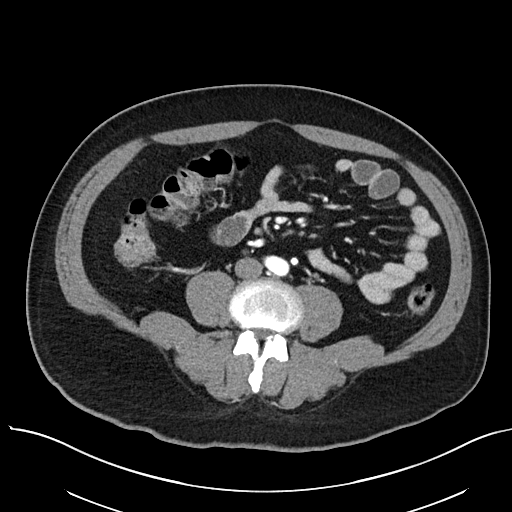
[im 60/102  soft-tissue]
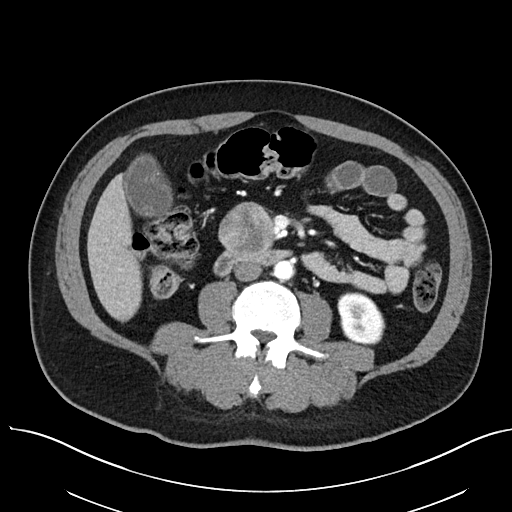
[im 66/102  soft-tissue]
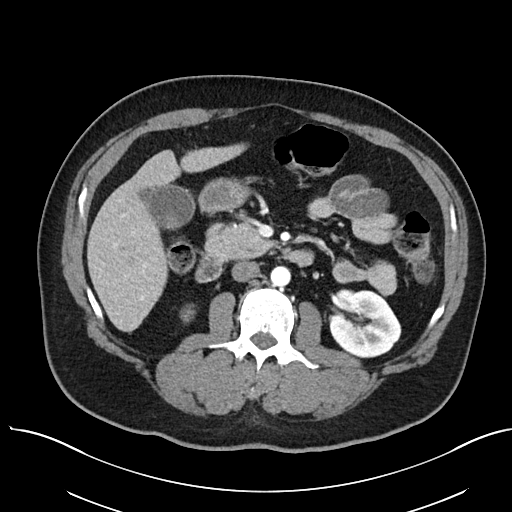
[im 66/102  bone]
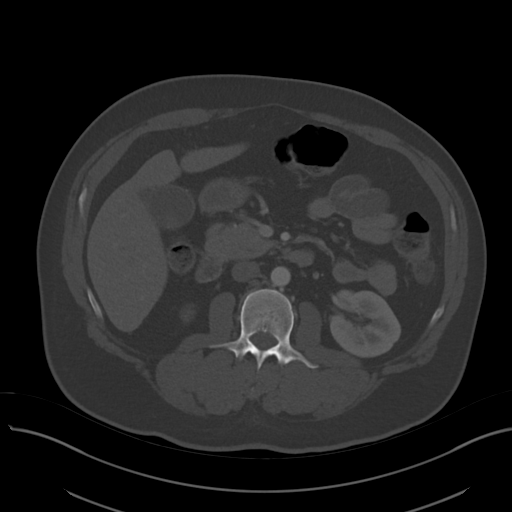
[im 72/102  soft-tissue]
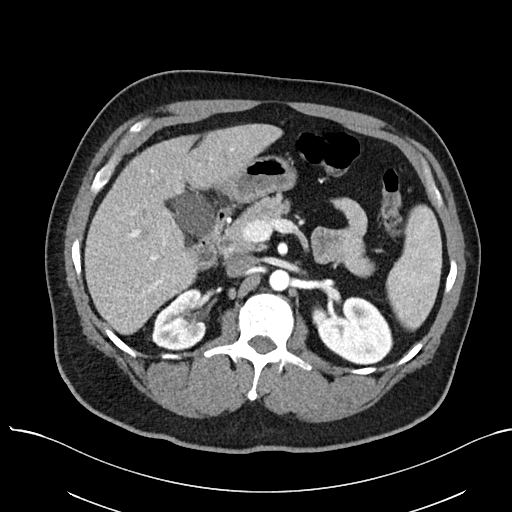
[im 78/102  soft-tissue]
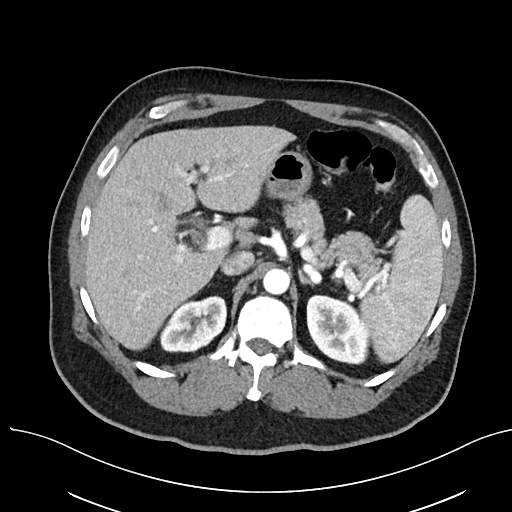
[im 90/102  soft-tissue]
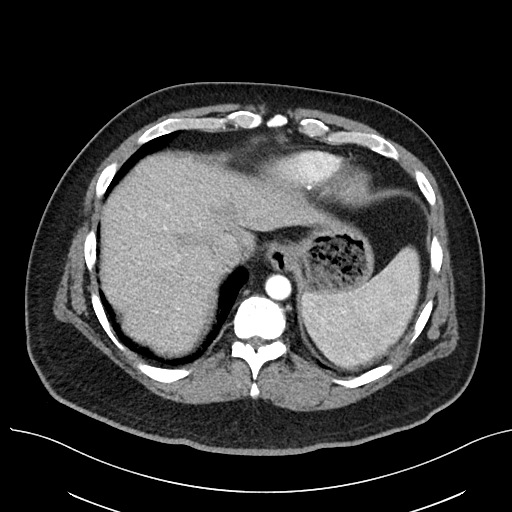
[im 96/102  soft-tissue]
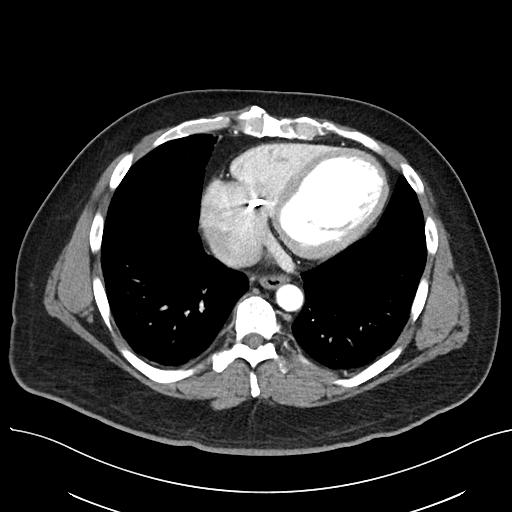

[Series 6: abdomen 3.0 mpr cor · coronal · 0.84mm/px · 3 of 106 slices shown]
[im 36/106  soft-tissue]
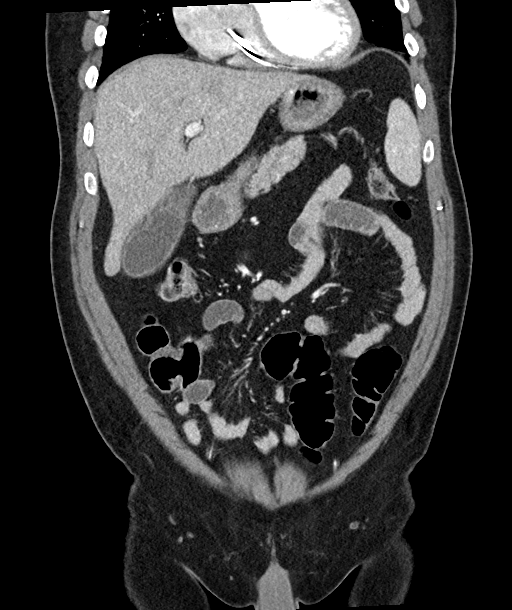
[im 47/106  soft-tissue]
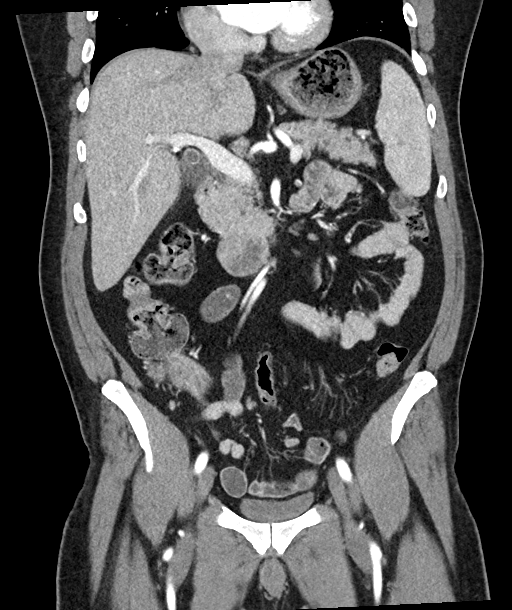
[im 59/106  soft-tissue]
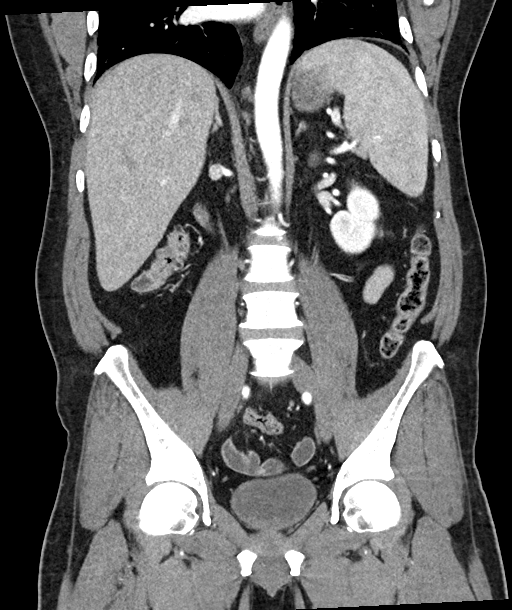

[16 of 46 positions shown; findings below may reference images not displayed]

FINDINGS: Lower chest: Mild bibasilar atelectatic changes are noted.

Hepatobiliary: Liver demonstrates mild fatty infiltration.
Gallbladder is well distended with multiple gallstones within.

Pancreas: Pancreas is well visualized and within normal limits with
the exception on the centrally necrotic mass lesion adjacent to the
head and uncinate process of the pancreas. This is stable in
appearance from the prior exam.

Spleen: Normal in size without focal abnormality.

Adrenals/Urinary Tract: Adrenal glands are within normal limits.
Kidneys demonstrate a normal enhancement pattern. No renal calculi
or obstructive changes are noted. The bladder is partially
distended.

Stomach/Bowel: Appendix is within normal limits. No obstructive or
inflammatory changes of the colon are seen. Small bowel and stomach
appear within normal limits.

Vascular/Lymphatic: No significant vascular findings are present. No
enlarged abdominal or pelvic lymph nodes.

Reproductive: Prostate is unremarkable.

Other: No abdominal wall hernia or abnormality. No abdominopelvic
ascites.

Musculoskeletal: No acute or significant osseous findings.
IMPRESSION: Stable mass lesion adjacent to the head and uncinate process of the
pancreas. This is stable dating back to 2140 and has been recently
biopsied.

Cholelithiasis without complicating factors.

Mild fatty infiltration of the liver.
